# Patient Record
Sex: Female | Born: 1940 | Race: Black or African American | Hispanic: No | Smoking: Current every day smoker
Health system: Southern US, Community
[De-identification: ages and names within clinical notes are randomized; demographics above are authoritative.]

## PROBLEM LIST (undated history)

## (undated) DIAGNOSIS — Z720 Tobacco use: Secondary | ICD-10-CM

## (undated) DIAGNOSIS — R011 Cardiac murmur, unspecified: Secondary | ICD-10-CM

## (undated) DIAGNOSIS — E876 Hypokalemia: Secondary | ICD-10-CM

## (undated) DIAGNOSIS — K565 Intestinal adhesions [bands], unspecified as to partial versus complete obstruction: Secondary | ICD-10-CM

## (undated) DIAGNOSIS — H409 Unspecified glaucoma: Secondary | ICD-10-CM

## (undated) DIAGNOSIS — M810 Age-related osteoporosis without current pathological fracture: Secondary | ICD-10-CM

## (undated) DIAGNOSIS — M7551 Bursitis of right shoulder: Secondary | ICD-10-CM

## (undated) DIAGNOSIS — E559 Vitamin D deficiency, unspecified: Secondary | ICD-10-CM

## (undated) DIAGNOSIS — M75101 Unspecified rotator cuff tear or rupture of right shoulder, not specified as traumatic: Secondary | ICD-10-CM

## (undated) DIAGNOSIS — N398 Other specified disorders of urinary system: Secondary | ICD-10-CM

## (undated) DIAGNOSIS — R55 Syncope and collapse: Secondary | ICD-10-CM

## (undated) DIAGNOSIS — Z923 Personal history of irradiation: Secondary | ICD-10-CM

## (undated) DIAGNOSIS — Z5189 Encounter for other specified aftercare: Secondary | ICD-10-CM

## (undated) DIAGNOSIS — I10 Essential (primary) hypertension: Secondary | ICD-10-CM

## (undated) DIAGNOSIS — E785 Hyperlipidemia, unspecified: Secondary | ICD-10-CM

## (undated) DIAGNOSIS — H9313 Tinnitus, bilateral: Secondary | ICD-10-CM

## (undated) DIAGNOSIS — F1290 Cannabis use, unspecified, uncomplicated: Secondary | ICD-10-CM

## (undated) DIAGNOSIS — K429 Umbilical hernia without obstruction or gangrene: Secondary | ICD-10-CM

## (undated) DIAGNOSIS — Z79891 Long term (current) use of opiate analgesic: Secondary | ICD-10-CM

## (undated) DIAGNOSIS — N179 Acute kidney failure, unspecified: Secondary | ICD-10-CM

## (undated) DIAGNOSIS — M5136 Other intervertebral disc degeneration, lumbar region: Secondary | ICD-10-CM

## (undated) DIAGNOSIS — F419 Anxiety disorder, unspecified: Secondary | ICD-10-CM

## (undated) DIAGNOSIS — B20 Human immunodeficiency virus [HIV] disease: Secondary | ICD-10-CM

## (undated) DIAGNOSIS — M1990 Unspecified osteoarthritis, unspecified site: Secondary | ICD-10-CM

## (undated) DIAGNOSIS — H903 Sensorineural hearing loss, bilateral: Secondary | ICD-10-CM

## (undated) DIAGNOSIS — L219 Seborrheic dermatitis, unspecified: Secondary | ICD-10-CM

## (undated) DIAGNOSIS — M25511 Pain in right shoulder: Secondary | ICD-10-CM

## (undated) DIAGNOSIS — K5903 Drug induced constipation: Secondary | ICD-10-CM

## (undated) DIAGNOSIS — H269 Unspecified cataract: Secondary | ICD-10-CM

## (undated) DIAGNOSIS — A6000 Herpesviral infection of urogenital system, unspecified: Secondary | ICD-10-CM

## (undated) DIAGNOSIS — K5790 Diverticulosis of intestine, part unspecified, without perforation or abscess without bleeding: Secondary | ICD-10-CM

## (undated) DIAGNOSIS — F32A Depression, unspecified: Secondary | ICD-10-CM

## (undated) DIAGNOSIS — F1120 Opioid dependence, uncomplicated: Secondary | ICD-10-CM

## (undated) DIAGNOSIS — I739 Peripheral vascular disease, unspecified: Secondary | ICD-10-CM

## (undated) HISTORY — DX: Essential (primary) hypertension: I10

## (undated) HISTORY — DX: Syncope and collapse: R55

## (undated) HISTORY — DX: Herpesviral infection of urogenital system, unspecified: A60.00

## (undated) HISTORY — DX: Long term (current) use of opiate analgesic: Z79.891

## (undated) HISTORY — DX: Other intervertebral disc degeneration, lumbar region: M51.36

## (undated) HISTORY — DX: Unspecified glaucoma: H40.9

## (undated) HISTORY — DX: Tinnitus, bilateral: H93.13

## (undated) HISTORY — PX: SMALL INTESTINE SURGERY: SHX150

## (undated) HISTORY — DX: Opioid dependence, uncomplicated: F11.20

## (undated) HISTORY — DX: Human immunodeficiency virus (HIV) disease: B20

## (undated) HISTORY — PX: ABDOMINAL HYSTERECTOMY: SHX81

## (undated) HISTORY — DX: Cannabis use, unspecified, uncomplicated: F12.90

## (undated) HISTORY — DX: Hyperlipidemia, unspecified: E78.5

## (undated) HISTORY — DX: Hypokalemia: E87.6

## (undated) HISTORY — DX: Unspecified cataract: H26.9

## (undated) HISTORY — DX: Peripheral vascular disease, unspecified: I73.9

## (undated) HISTORY — DX: Unspecified osteoarthritis, unspecified site: M19.90

## (undated) HISTORY — DX: Umbilical hernia without obstruction or gangrene: K42.9

## (undated) HISTORY — PX: EYE SURGERY: SHX253

## (undated) HISTORY — DX: Intestinal adhesions (bands), unspecified as to partial versus complete obstruction: K56.50

## (undated) HISTORY — PX: CHOLECYSTECTOMY: SHX55

## (undated) HISTORY — PX: APPENDECTOMY: SHX54

## (undated) HISTORY — DX: Pain in right shoulder: M25.511

## (undated) HISTORY — DX: Vitamin D deficiency, unspecified: E55.9

## (undated) HISTORY — DX: Drug induced constipation: K59.03

## (undated) HISTORY — DX: Tobacco use: Z72.0

## (undated) HISTORY — DX: Bursitis of right shoulder: M75.51

## (undated) HISTORY — PX: BREAST SURGERY: SHX581

## (undated) HISTORY — DX: Unspecified rotator cuff tear or rupture of right shoulder, not specified as traumatic: M75.101

## (undated) HISTORY — DX: Age-related osteoporosis without current pathological fracture: M81.0

## (undated) HISTORY — DX: Diverticulosis of intestine, part unspecified, without perforation or abscess without bleeding: K57.90

## (undated) HISTORY — DX: Other specified disorders of urinary system: N39.8

## (undated) HISTORY — DX: Encounter for other specified aftercare: Z51.89

## (undated) HISTORY — DX: Seborrheic dermatitis, unspecified: L21.9

## (undated) HISTORY — DX: Anxiety disorder, unspecified: F41.9

## (undated) HISTORY — DX: Personal history of irradiation: Z92.3

---
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
## 1986-03-27 DIAGNOSIS — B20 Human immunodeficiency virus [HIV] disease: Secondary | ICD-10-CM

## 1986-03-27 HISTORY — DX: Human immunodeficiency virus (HIV) disease: B20

## 1988-01-07 ENCOUNTER — Encounter (INDEPENDENT_AMBULATORY_CARE_PROVIDER_SITE_OTHER): Payer: Self-pay | Admitting: *Deleted

## 1988-01-07 LAB — CONVERTED CEMR LAB: CD4 Count: 432 microliters

## 1997-07-20 ENCOUNTER — Encounter: Admission: RE | Admit: 1997-07-20 | Discharge: 1997-07-20 | Payer: Self-pay | Admitting: Infectious Diseases

## 1997-08-14 ENCOUNTER — Encounter: Admission: RE | Admit: 1997-08-14 | Discharge: 1997-08-14 | Payer: Self-pay | Admitting: Infectious Diseases

## 1997-09-15 ENCOUNTER — Encounter: Admission: RE | Admit: 1997-09-15 | Discharge: 1997-09-15 | Payer: Self-pay | Admitting: Obstetrics and Gynecology

## 1997-11-06 ENCOUNTER — Encounter: Admission: RE | Admit: 1997-11-06 | Discharge: 1997-11-06 | Payer: Self-pay | Admitting: *Deleted

## 1997-11-19 ENCOUNTER — Encounter: Admission: RE | Admit: 1997-11-19 | Discharge: 1997-11-19 | Payer: Self-pay | Admitting: Infectious Diseases

## 1998-01-05 ENCOUNTER — Ambulatory Visit (HOSPITAL_COMMUNITY): Admission: RE | Admit: 1998-01-05 | Discharge: 1998-01-05 | Payer: Self-pay | Admitting: Infectious Diseases

## 1998-03-02 ENCOUNTER — Encounter: Admission: RE | Admit: 1998-03-02 | Discharge: 1998-03-02 | Payer: Self-pay | Admitting: Infectious Diseases

## 1998-03-19 ENCOUNTER — Encounter: Admission: RE | Admit: 1998-03-19 | Discharge: 1998-03-19 | Payer: Self-pay | Admitting: Infectious Diseases

## 1998-04-20 ENCOUNTER — Encounter: Admission: RE | Admit: 1998-04-20 | Discharge: 1998-04-20 | Payer: Self-pay | Admitting: Infectious Diseases

## 1998-06-15 ENCOUNTER — Encounter: Admission: RE | Admit: 1998-06-15 | Discharge: 1998-06-15 | Payer: Self-pay | Admitting: Infectious Diseases

## 1998-07-15 ENCOUNTER — Encounter: Admission: RE | Admit: 1998-07-15 | Discharge: 1998-07-15 | Payer: Self-pay | Admitting: Infectious Diseases

## 1998-08-03 ENCOUNTER — Other Ambulatory Visit: Admission: RE | Admit: 1998-08-03 | Discharge: 1998-08-03 | Payer: Self-pay | Admitting: Obstetrics

## 1998-08-03 ENCOUNTER — Encounter: Admission: RE | Admit: 1998-08-03 | Discharge: 1998-08-03 | Payer: Self-pay | Admitting: Obstetrics & Gynecology

## 1998-08-04 ENCOUNTER — Encounter: Admission: RE | Admit: 1998-08-04 | Discharge: 1998-08-04 | Payer: Self-pay | Admitting: Infectious Diseases

## 1998-08-16 ENCOUNTER — Ambulatory Visit (HOSPITAL_COMMUNITY): Admission: RE | Admit: 1998-08-16 | Discharge: 1998-08-16 | Payer: Self-pay | Admitting: Obstetrics

## 1998-08-20 ENCOUNTER — Ambulatory Visit (HOSPITAL_COMMUNITY): Admission: RE | Admit: 1998-08-20 | Discharge: 1998-08-20 | Payer: Self-pay

## 1998-09-21 ENCOUNTER — Encounter: Admission: RE | Admit: 1998-09-21 | Discharge: 1998-09-21 | Payer: Self-pay | Admitting: Infectious Diseases

## 1998-09-21 ENCOUNTER — Encounter: Admission: RE | Admit: 1998-09-21 | Discharge: 1998-09-21 | Payer: Self-pay | Admitting: Obstetrics & Gynecology

## 1998-09-21 ENCOUNTER — Other Ambulatory Visit: Admission: RE | Admit: 1998-09-21 | Discharge: 1998-09-21 | Payer: Self-pay | Admitting: Obstetrics & Gynecology

## 1998-10-05 ENCOUNTER — Encounter: Admission: RE | Admit: 1998-10-05 | Discharge: 1998-10-05 | Payer: Self-pay | Admitting: Obstetrics & Gynecology

## 1998-12-23 ENCOUNTER — Encounter: Admission: RE | Admit: 1998-12-23 | Discharge: 1998-12-23 | Payer: Self-pay | Admitting: Infectious Diseases

## 1999-02-24 ENCOUNTER — Ambulatory Visit (HOSPITAL_COMMUNITY): Admission: RE | Admit: 1999-02-24 | Discharge: 1999-02-24 | Payer: Self-pay | Admitting: Obstetrics & Gynecology

## 1999-03-15 ENCOUNTER — Encounter: Admission: RE | Admit: 1999-03-15 | Discharge: 1999-03-15 | Payer: Self-pay | Admitting: Infectious Diseases

## 1999-03-22 ENCOUNTER — Encounter: Admission: RE | Admit: 1999-03-22 | Discharge: 1999-03-22 | Payer: Self-pay | Admitting: Obstetrics & Gynecology

## 1999-04-14 ENCOUNTER — Encounter: Admission: RE | Admit: 1999-04-14 | Discharge: 1999-04-14 | Payer: Self-pay | Admitting: Infectious Diseases

## 1999-05-31 ENCOUNTER — Encounter: Admission: RE | Admit: 1999-05-31 | Discharge: 1999-05-31 | Payer: Self-pay | Admitting: Internal Medicine

## 1999-05-31 ENCOUNTER — Ambulatory Visit (HOSPITAL_COMMUNITY): Admission: RE | Admit: 1999-05-31 | Discharge: 1999-05-31 | Payer: Self-pay | Admitting: Internal Medicine

## 1999-05-31 ENCOUNTER — Inpatient Hospital Stay (HOSPITAL_COMMUNITY): Admission: AD | Admit: 1999-05-31 | Discharge: 1999-06-06 | Payer: Self-pay | Admitting: Internal Medicine

## 1999-05-31 ENCOUNTER — Encounter: Payer: Self-pay | Admitting: Internal Medicine

## 1999-06-02 ENCOUNTER — Encounter: Payer: Self-pay | Admitting: Internal Medicine

## 1999-06-03 ENCOUNTER — Encounter: Payer: Self-pay | Admitting: Surgery

## 1999-06-09 ENCOUNTER — Encounter: Admission: RE | Admit: 1999-06-09 | Discharge: 1999-06-09 | Payer: Self-pay | Admitting: Infectious Diseases

## 1999-07-05 ENCOUNTER — Encounter: Admission: RE | Admit: 1999-07-05 | Discharge: 1999-07-05 | Payer: Self-pay | Admitting: Infectious Diseases

## 1999-07-07 ENCOUNTER — Encounter: Payer: Self-pay | Admitting: Surgery

## 1999-07-07 ENCOUNTER — Ambulatory Visit (HOSPITAL_COMMUNITY): Admission: RE | Admit: 1999-07-07 | Discharge: 1999-07-08 | Payer: Self-pay | Admitting: Surgery

## 1999-07-21 ENCOUNTER — Encounter: Admission: RE | Admit: 1999-07-21 | Discharge: 1999-07-21 | Payer: Self-pay | Admitting: Infectious Diseases

## 1999-10-25 ENCOUNTER — Encounter: Admission: RE | Admit: 1999-10-25 | Discharge: 1999-10-25 | Payer: Self-pay | Admitting: Infectious Diseases

## 1999-11-17 ENCOUNTER — Encounter: Admission: RE | Admit: 1999-11-17 | Discharge: 1999-11-17 | Payer: Self-pay | Admitting: Infectious Diseases

## 2000-01-29 ENCOUNTER — Encounter: Payer: Self-pay | Admitting: Neurosurgery

## 2000-01-29 ENCOUNTER — Ambulatory Visit (HOSPITAL_COMMUNITY): Admission: RE | Admit: 2000-01-29 | Discharge: 2000-01-29 | Payer: Self-pay | Admitting: Neurosurgery

## 2000-02-22 ENCOUNTER — Encounter: Admission: RE | Admit: 2000-02-22 | Discharge: 2000-02-22 | Payer: Self-pay | Admitting: Infectious Diseases

## 2000-03-22 ENCOUNTER — Encounter: Admission: RE | Admit: 2000-03-22 | Discharge: 2000-03-22 | Payer: Self-pay | Admitting: Infectious Diseases

## 2000-05-01 ENCOUNTER — Encounter: Admission: RE | Admit: 2000-05-01 | Discharge: 2000-05-01 | Payer: Self-pay | Admitting: Obstetrics & Gynecology

## 2000-05-10 ENCOUNTER — Ambulatory Visit (HOSPITAL_COMMUNITY): Admission: RE | Admit: 2000-05-10 | Discharge: 2000-05-10 | Payer: Self-pay | Admitting: Obstetrics & Gynecology

## 2000-06-06 ENCOUNTER — Encounter: Admission: RE | Admit: 2000-06-06 | Discharge: 2000-06-06 | Payer: Self-pay | Admitting: Infectious Diseases

## 2000-07-26 ENCOUNTER — Encounter: Admission: RE | Admit: 2000-07-26 | Discharge: 2000-07-26 | Payer: Self-pay | Admitting: Infectious Diseases

## 2000-07-31 ENCOUNTER — Encounter: Admission: RE | Admit: 2000-07-31 | Discharge: 2000-07-31 | Payer: Self-pay | Admitting: Obstetrics & Gynecology

## 2000-08-23 ENCOUNTER — Encounter: Admission: RE | Admit: 2000-08-23 | Discharge: 2000-08-23 | Payer: Self-pay | Admitting: Infectious Diseases

## 2000-10-03 ENCOUNTER — Encounter: Admission: RE | Admit: 2000-10-03 | Discharge: 2000-10-03 | Payer: Self-pay | Admitting: Infectious Diseases

## 2000-12-13 ENCOUNTER — Encounter: Admission: RE | Admit: 2000-12-13 | Discharge: 2000-12-13 | Payer: Self-pay | Admitting: Infectious Diseases

## 2001-01-15 ENCOUNTER — Encounter: Admission: RE | Admit: 2001-01-15 | Discharge: 2001-01-15 | Payer: Self-pay | Admitting: Infectious Diseases

## 2001-01-31 ENCOUNTER — Encounter: Admission: RE | Admit: 2001-01-31 | Discharge: 2001-01-31 | Payer: Self-pay | Admitting: Obstetrics

## 2001-05-07 ENCOUNTER — Encounter: Admission: RE | Admit: 2001-05-07 | Discharge: 2001-05-07 | Payer: Self-pay | Admitting: Infectious Diseases

## 2001-06-25 ENCOUNTER — Encounter: Admission: RE | Admit: 2001-06-25 | Discharge: 2001-06-25 | Payer: Self-pay | Admitting: Infectious Diseases

## 2001-07-25 ENCOUNTER — Encounter (INDEPENDENT_AMBULATORY_CARE_PROVIDER_SITE_OTHER): Payer: Self-pay | Admitting: *Deleted

## 2001-07-25 ENCOUNTER — Encounter: Admission: RE | Admit: 2001-07-25 | Discharge: 2001-07-25 | Payer: Self-pay | Admitting: Obstetrics and Gynecology

## 2001-08-06 ENCOUNTER — Ambulatory Visit (HOSPITAL_COMMUNITY): Admission: RE | Admit: 2001-08-06 | Discharge: 2001-08-06 | Payer: Self-pay | Admitting: Obstetrics and Gynecology

## 2001-08-27 ENCOUNTER — Encounter: Admission: RE | Admit: 2001-08-27 | Discharge: 2001-08-27 | Payer: Self-pay | Admitting: Infectious Diseases

## 2001-10-31 ENCOUNTER — Encounter: Admission: RE | Admit: 2001-10-31 | Discharge: 2001-10-31 | Payer: Self-pay | Admitting: Infectious Diseases

## 2001-11-15 ENCOUNTER — Encounter: Admission: RE | Admit: 2001-11-15 | Discharge: 2001-11-15 | Payer: Self-pay | Admitting: Infectious Diseases

## 2001-12-17 ENCOUNTER — Encounter: Admission: RE | Admit: 2001-12-17 | Discharge: 2001-12-17 | Payer: Self-pay | Admitting: Infectious Diseases

## 2002-01-02 ENCOUNTER — Ambulatory Visit (HOSPITAL_COMMUNITY): Admission: RE | Admit: 2002-01-02 | Discharge: 2002-01-02 | Payer: Self-pay | Admitting: Gastroenterology

## 2002-04-17 ENCOUNTER — Ambulatory Visit (HOSPITAL_COMMUNITY): Admission: RE | Admit: 2002-04-17 | Discharge: 2002-04-17 | Payer: Self-pay | Admitting: Infectious Diseases

## 2002-04-17 ENCOUNTER — Encounter: Admission: RE | Admit: 2002-04-17 | Discharge: 2002-04-17 | Payer: Self-pay | Admitting: Infectious Diseases

## 2002-04-17 ENCOUNTER — Encounter: Payer: Self-pay | Admitting: Infectious Diseases

## 2002-07-31 ENCOUNTER — Encounter (INDEPENDENT_AMBULATORY_CARE_PROVIDER_SITE_OTHER): Payer: Self-pay | Admitting: *Deleted

## 2002-07-31 ENCOUNTER — Encounter: Admission: RE | Admit: 2002-07-31 | Discharge: 2002-07-31 | Payer: Self-pay | Admitting: Infectious Diseases

## 2002-07-31 LAB — CONVERTED CEMR LAB
CD4 Count: 1240 microliters
HIV 1 RNA Quant: 28 copies/mL

## 2002-08-01 ENCOUNTER — Encounter: Admission: RE | Admit: 2002-08-01 | Discharge: 2002-08-01 | Payer: Self-pay | Admitting: Family Medicine

## 2002-08-01 ENCOUNTER — Other Ambulatory Visit: Admission: RE | Admit: 2002-08-01 | Discharge: 2002-08-01 | Payer: Self-pay | Admitting: Family Medicine

## 2002-08-12 ENCOUNTER — Ambulatory Visit (HOSPITAL_COMMUNITY): Admission: RE | Admit: 2002-08-12 | Discharge: 2002-08-12 | Payer: Self-pay | Admitting: Obstetrics and Gynecology

## 2002-08-21 ENCOUNTER — Encounter: Admission: RE | Admit: 2002-08-21 | Discharge: 2002-08-21 | Payer: Self-pay | Admitting: Infectious Diseases

## 2002-10-03 ENCOUNTER — Encounter (INDEPENDENT_AMBULATORY_CARE_PROVIDER_SITE_OTHER): Payer: Self-pay | Admitting: *Deleted

## 2002-10-03 ENCOUNTER — Encounter: Admission: RE | Admit: 2002-10-03 | Discharge: 2002-10-03 | Payer: Self-pay | Admitting: Family Medicine

## 2002-10-03 ENCOUNTER — Other Ambulatory Visit: Admission: RE | Admit: 2002-10-03 | Discharge: 2002-10-03 | Payer: Self-pay | Admitting: Obstetrics & Gynecology

## 2002-11-05 ENCOUNTER — Ambulatory Visit: Admission: RE | Admit: 2002-11-05 | Discharge: 2002-11-05 | Payer: Self-pay | Admitting: Gynecologic Oncology

## 2002-12-09 ENCOUNTER — Encounter: Admission: RE | Admit: 2002-12-09 | Discharge: 2002-12-09 | Payer: Self-pay | Admitting: Infectious Diseases

## 2002-12-25 ENCOUNTER — Encounter: Admission: RE | Admit: 2002-12-25 | Discharge: 2002-12-25 | Payer: Self-pay | Admitting: Infectious Diseases

## 2003-03-17 ENCOUNTER — Encounter: Admission: RE | Admit: 2003-03-17 | Discharge: 2003-03-17 | Payer: Self-pay | Admitting: Infectious Diseases

## 2003-03-27 ENCOUNTER — Encounter: Admission: RE | Admit: 2003-03-27 | Discharge: 2003-03-27 | Payer: Self-pay | Admitting: Infectious Diseases

## 2003-06-11 ENCOUNTER — Encounter: Admission: RE | Admit: 2003-06-11 | Discharge: 2003-06-11 | Payer: Self-pay | Admitting: Infectious Diseases

## 2003-07-08 ENCOUNTER — Encounter: Admission: RE | Admit: 2003-07-08 | Discharge: 2003-07-08 | Payer: Self-pay | Admitting: Infectious Diseases

## 2003-08-18 ENCOUNTER — Ambulatory Visit (HOSPITAL_COMMUNITY): Admission: RE | Admit: 2003-08-18 | Discharge: 2003-08-18 | Payer: Self-pay | Admitting: Infectious Diseases

## 2003-09-10 ENCOUNTER — Encounter: Admission: RE | Admit: 2003-09-10 | Discharge: 2003-09-10 | Payer: Self-pay | Admitting: Infectious Diseases

## 2003-10-20 ENCOUNTER — Encounter: Admission: RE | Admit: 2003-10-20 | Discharge: 2003-10-20 | Payer: Self-pay | Admitting: Infectious Diseases

## 2003-10-29 ENCOUNTER — Encounter: Admission: RE | Admit: 2003-10-29 | Discharge: 2003-10-29 | Payer: Self-pay | Admitting: Infectious Diseases

## 2003-12-23 ENCOUNTER — Ambulatory Visit: Payer: Self-pay | Admitting: Infectious Diseases

## 2004-01-29 ENCOUNTER — Ambulatory Visit: Payer: Self-pay | Admitting: Infectious Diseases

## 2004-02-18 ENCOUNTER — Ambulatory Visit: Payer: Self-pay | Admitting: Infectious Diseases

## 2004-02-25 ENCOUNTER — Ambulatory Visit: Payer: Self-pay | Admitting: *Deleted

## 2004-03-04 ENCOUNTER — Encounter: Admission: RE | Admit: 2004-03-04 | Discharge: 2004-03-04 | Payer: Self-pay | Admitting: Urology

## 2004-03-11 ENCOUNTER — Ambulatory Visit (HOSPITAL_BASED_OUTPATIENT_CLINIC_OR_DEPARTMENT_OTHER): Admission: RE | Admit: 2004-03-11 | Discharge: 2004-03-11 | Payer: Self-pay | Admitting: Urology

## 2004-03-11 ENCOUNTER — Ambulatory Visit (HOSPITAL_COMMUNITY): Admission: RE | Admit: 2004-03-11 | Discharge: 2004-03-11 | Payer: Self-pay | Admitting: Urology

## 2004-03-11 ENCOUNTER — Encounter (INDEPENDENT_AMBULATORY_CARE_PROVIDER_SITE_OTHER): Payer: Self-pay | Admitting: *Deleted

## 2004-04-19 ENCOUNTER — Ambulatory Visit: Payer: Self-pay | Admitting: Infectious Diseases

## 2004-05-19 ENCOUNTER — Ambulatory Visit: Payer: Self-pay | Admitting: Infectious Diseases

## 2004-06-28 ENCOUNTER — Emergency Department (HOSPITAL_COMMUNITY): Admission: EM | Admit: 2004-06-28 | Discharge: 2004-06-28 | Payer: Self-pay | Admitting: Emergency Medicine

## 2004-06-28 ENCOUNTER — Ambulatory Visit: Payer: Self-pay | Admitting: Obstetrics and Gynecology

## 2004-07-08 ENCOUNTER — Encounter: Admission: RE | Admit: 2004-07-08 | Discharge: 2004-08-09 | Payer: Self-pay | Admitting: Orthopedic Surgery

## 2004-07-19 ENCOUNTER — Ambulatory Visit: Payer: Self-pay | Admitting: Infectious Diseases

## 2004-08-31 ENCOUNTER — Ambulatory Visit (HOSPITAL_COMMUNITY): Admission: RE | Admit: 2004-08-31 | Discharge: 2004-08-31 | Payer: Self-pay | Admitting: Family Medicine

## 2004-09-01 ENCOUNTER — Ambulatory Visit: Payer: Self-pay | Admitting: Infectious Diseases

## 2004-09-08 ENCOUNTER — Ambulatory Visit (HOSPITAL_COMMUNITY): Admission: RE | Admit: 2004-09-08 | Discharge: 2004-09-08 | Payer: Self-pay | Admitting: Infectious Diseases

## 2004-12-06 ENCOUNTER — Ambulatory Visit: Payer: Self-pay | Admitting: Infectious Diseases

## 2005-01-05 ENCOUNTER — Ambulatory Visit: Payer: Self-pay | Admitting: Infectious Diseases

## 2005-03-28 ENCOUNTER — Ambulatory Visit: Payer: Self-pay | Admitting: Infectious Diseases

## 2005-05-19 ENCOUNTER — Ambulatory Visit: Payer: Self-pay | Admitting: Infectious Diseases

## 2005-07-18 ENCOUNTER — Ambulatory Visit: Payer: Self-pay | Admitting: Infectious Diseases

## 2005-08-04 ENCOUNTER — Ambulatory Visit: Payer: Self-pay | Admitting: Infectious Diseases

## 2005-11-28 ENCOUNTER — Ambulatory Visit: Payer: Self-pay | Admitting: Infectious Diseases

## 2005-11-28 ENCOUNTER — Encounter (INDEPENDENT_AMBULATORY_CARE_PROVIDER_SITE_OTHER): Payer: Self-pay | Admitting: *Deleted

## 2005-11-30 ENCOUNTER — Ambulatory Visit (HOSPITAL_COMMUNITY): Admission: RE | Admit: 2005-11-30 | Discharge: 2005-11-30 | Payer: Self-pay | Admitting: Infectious Diseases

## 2005-12-21 ENCOUNTER — Ambulatory Visit: Payer: Self-pay | Admitting: Obstetrics and Gynecology

## 2006-01-05 ENCOUNTER — Ambulatory Visit: Payer: Self-pay | Admitting: Infectious Diseases

## 2006-03-20 ENCOUNTER — Encounter (INDEPENDENT_AMBULATORY_CARE_PROVIDER_SITE_OTHER): Payer: Self-pay | Admitting: *Deleted

## 2006-03-20 ENCOUNTER — Ambulatory Visit: Payer: Self-pay | Admitting: Infectious Diseases

## 2006-03-20 LAB — CONVERTED CEMR LAB
AST: 18 units/L (ref 0–37)
Alkaline Phosphatase: 87 units/L (ref 39–117)
CO2: 24 meq/L (ref 19–32)
Calcium: 10 mg/dL (ref 8.4–10.5)
Chloride: 106 meq/L (ref 96–112)
Glucose, Bld: 94 mg/dL (ref 70–99)
HDL: 65 mg/dL (ref >39)
LDL Cholesterol: 64 mg/dL (ref 0–99)
Potassium: 4.1 meq/L (ref 3.5–5.3)
Sodium: 138 meq/L (ref 135–145)
Total Bilirubin: 0.4 mg/dL (ref 0.3–1.2)
Total CHOL/HDL Ratio: 2.2

## 2006-04-19 ENCOUNTER — Ambulatory Visit: Payer: Self-pay | Admitting: Infectious Diseases

## 2006-05-07 ENCOUNTER — Encounter (INDEPENDENT_AMBULATORY_CARE_PROVIDER_SITE_OTHER): Payer: Self-pay | Admitting: *Deleted

## 2006-05-07 LAB — CONVERTED CEMR LAB

## 2006-05-20 ENCOUNTER — Encounter (INDEPENDENT_AMBULATORY_CARE_PROVIDER_SITE_OTHER): Payer: Self-pay | Admitting: *Deleted

## 2006-06-23 ENCOUNTER — Encounter: Payer: Self-pay | Admitting: Infectious Diseases

## 2006-06-25 ENCOUNTER — Telehealth: Payer: Self-pay | Admitting: Infectious Diseases

## 2006-06-26 ENCOUNTER — Ambulatory Visit: Payer: Self-pay | Admitting: Infectious Diseases

## 2006-06-26 LAB — CONVERTED CEMR LAB
ALT: 16 units/L (ref 0–35)
AST: 11 units/L (ref 0–37)
Alkaline Phosphatase: 58 units/L (ref 39–117)
Basophils Absolute: 0 10*3/uL (ref 0.0–0.1)
Basophils Relative: 0 % (ref 0–1)
Creatinine, Ser: 0.77 mg/dL (ref 0.40–1.20)
Eosinophils Absolute: 0.1 10*3/uL (ref 0.0–0.7)
Eosinophils Relative: 1 % (ref 0–5)
HCT: 40.7 % (ref 36.0–46.0)
HDL: 75 mg/dL (ref >39)
Hemoglobin: 13.5 g/dL (ref 12.0–15.0)
MCHC: 33.2 g/dL (ref 30.0–36.0)
MCV: 101.5 fL — ABNORMAL HIGH (ref 78.0–100.0)
Monocytes Absolute: 0.5 10*3/uL (ref 0.2–0.7)
Neutro Abs: 4.2 10*3/uL (ref 1.7–7.7)
RDW: 15.9 % — ABNORMAL HIGH (ref 11.5–14.0)
Total Bilirubin: 0.4 mg/dL (ref 0.3–1.2)
Total CHOL/HDL Ratio: 1.8
VLDL: 10 mg/dL (ref 0–40)

## 2006-07-17 ENCOUNTER — Telehealth: Payer: Self-pay | Admitting: Infectious Diseases

## 2006-07-19 ENCOUNTER — Encounter: Payer: Self-pay | Admitting: Infectious Diseases

## 2006-07-20 ENCOUNTER — Telehealth (INDEPENDENT_AMBULATORY_CARE_PROVIDER_SITE_OTHER): Payer: Self-pay | Admitting: *Deleted

## 2006-07-20 DIAGNOSIS — A6000 Herpesviral infection of urogenital system, unspecified: Secondary | ICD-10-CM | POA: Insufficient documentation

## 2006-07-20 DIAGNOSIS — H409 Unspecified glaucoma: Secondary | ICD-10-CM | POA: Insufficient documentation

## 2006-07-20 DIAGNOSIS — B49 Unspecified mycosis: Secondary | ICD-10-CM | POA: Insufficient documentation

## 2006-07-20 DIAGNOSIS — M51369 Other intervertebral disc degeneration, lumbar region without mention of lumbar back pain or lower extremity pain: Secondary | ICD-10-CM

## 2006-07-20 DIAGNOSIS — I10 Essential (primary) hypertension: Secondary | ICD-10-CM | POA: Insufficient documentation

## 2006-07-20 DIAGNOSIS — R8789 Other abnormal findings in specimens from female genital organs: Secondary | ICD-10-CM

## 2006-07-20 DIAGNOSIS — M5136 Other intervertebral disc degeneration, lumbar region: Secondary | ICD-10-CM | POA: Insufficient documentation

## 2006-07-20 DIAGNOSIS — Z8742 Personal history of other diseases of the female genital tract: Secondary | ICD-10-CM | POA: Insufficient documentation

## 2006-07-20 HISTORY — DX: Herpesviral infection of urogenital system, unspecified: A60.00

## 2006-07-20 HISTORY — DX: Essential (primary) hypertension: I10

## 2006-07-20 HISTORY — DX: Unspecified glaucoma: H40.9

## 2006-07-20 HISTORY — DX: Other intervertebral disc degeneration, lumbar region: M51.36

## 2006-07-20 HISTORY — DX: Other intervertebral disc degeneration, lumbar region without mention of lumbar back pain or lower extremity pain: M51.369

## 2006-08-10 ENCOUNTER — Encounter (INDEPENDENT_AMBULATORY_CARE_PROVIDER_SITE_OTHER): Payer: Self-pay | Admitting: *Deleted

## 2006-08-31 ENCOUNTER — Encounter: Payer: Self-pay | Admitting: Infectious Diseases

## 2006-09-07 ENCOUNTER — Telehealth: Payer: Self-pay | Admitting: Infectious Diseases

## 2006-09-07 ENCOUNTER — Ambulatory Visit: Payer: Self-pay | Admitting: Infectious Diseases

## 2006-09-07 ENCOUNTER — Encounter: Payer: Self-pay | Admitting: Infectious Diseases

## 2006-09-09 ENCOUNTER — Encounter: Admission: RE | Admit: 2006-09-09 | Discharge: 2006-09-09 | Payer: Self-pay | Admitting: Neurosurgery

## 2006-09-10 ENCOUNTER — Encounter: Payer: Self-pay | Admitting: Infectious Diseases

## 2006-09-10 LAB — CONVERTED CEMR LAB
Pap Smear: NORMAL
Pap Smear: NORMAL

## 2006-09-12 ENCOUNTER — Encounter: Payer: Self-pay | Admitting: Infectious Diseases

## 2006-09-12 ENCOUNTER — Encounter (INDEPENDENT_AMBULATORY_CARE_PROVIDER_SITE_OTHER): Payer: Self-pay | Admitting: *Deleted

## 2006-09-21 ENCOUNTER — Telehealth: Payer: Self-pay | Admitting: Infectious Diseases

## 2006-10-23 ENCOUNTER — Encounter: Payer: Self-pay | Admitting: Infectious Diseases

## 2006-10-30 ENCOUNTER — Ambulatory Visit: Payer: Self-pay | Admitting: Infectious Diseases

## 2006-10-30 DIAGNOSIS — N3000 Acute cystitis without hematuria: Secondary | ICD-10-CM | POA: Insufficient documentation

## 2006-11-06 ENCOUNTER — Ambulatory Visit: Payer: Self-pay | Admitting: Infectious Diseases

## 2006-11-06 LAB — CONVERTED CEMR LAB
Bilirubin Urine: NEGATIVE
Leukocytes, UA: NEGATIVE
Specific Gravity, Urine: 1.014 (ref 1.005–1.03)
Urine Glucose: NEGATIVE mg/dL
pH: 7 (ref 5.0–8.0)

## 2006-11-07 ENCOUNTER — Encounter (INDEPENDENT_AMBULATORY_CARE_PROVIDER_SITE_OTHER): Payer: Self-pay | Admitting: *Deleted

## 2006-11-09 ENCOUNTER — Ambulatory Visit: Payer: Self-pay | Admitting: Infectious Diseases

## 2006-12-20 ENCOUNTER — Ambulatory Visit: Payer: Self-pay | Admitting: Infectious Diseases

## 2006-12-22 ENCOUNTER — Encounter: Payer: Self-pay | Admitting: Infectious Diseases

## 2007-01-16 ENCOUNTER — Telehealth: Payer: Self-pay | Admitting: Infectious Diseases

## 2007-01-17 ENCOUNTER — Encounter: Payer: Self-pay | Admitting: Infectious Diseases

## 2007-01-17 ENCOUNTER — Ambulatory Visit (HOSPITAL_COMMUNITY): Admission: RE | Admit: 2007-01-17 | Discharge: 2007-01-17 | Payer: Self-pay | Admitting: Infectious Diseases

## 2007-01-29 ENCOUNTER — Ambulatory Visit: Payer: Self-pay | Admitting: Infectious Diseases

## 2007-02-20 ENCOUNTER — Telehealth: Payer: Self-pay | Admitting: Infectious Diseases

## 2007-02-21 ENCOUNTER — Telehealth (INDEPENDENT_AMBULATORY_CARE_PROVIDER_SITE_OTHER): Payer: Self-pay | Admitting: *Deleted

## 2007-02-22 ENCOUNTER — Encounter: Payer: Self-pay | Admitting: Infectious Diseases

## 2007-03-12 ENCOUNTER — Encounter: Payer: Self-pay | Admitting: Infectious Diseases

## 2007-03-13 ENCOUNTER — Encounter (INDEPENDENT_AMBULATORY_CARE_PROVIDER_SITE_OTHER): Payer: Self-pay | Admitting: *Deleted

## 2007-03-28 ENCOUNTER — Telehealth: Payer: Self-pay | Admitting: Infectious Diseases

## 2007-04-02 ENCOUNTER — Emergency Department (HOSPITAL_COMMUNITY): Admission: EM | Admit: 2007-04-02 | Discharge: 2007-04-02 | Payer: Self-pay | Admitting: Emergency Medicine

## 2007-04-14 HISTORY — PX: AORTO-FEMORAL BYPASS GRAFT: SHX885

## 2007-04-22 ENCOUNTER — Telehealth: Payer: Self-pay | Admitting: Infectious Diseases

## 2007-04-24 ENCOUNTER — Encounter: Payer: Self-pay | Admitting: Infectious Diseases

## 2007-04-24 ENCOUNTER — Ambulatory Visit: Payer: Self-pay | Admitting: Infectious Diseases

## 2007-04-25 ENCOUNTER — Ambulatory Visit (HOSPITAL_COMMUNITY): Admission: RE | Admit: 2007-04-25 | Discharge: 2007-04-25 | Payer: Self-pay | Admitting: Infectious Diseases

## 2007-04-25 ENCOUNTER — Encounter: Payer: Self-pay | Admitting: Infectious Diseases

## 2007-04-25 ENCOUNTER — Ambulatory Visit: Payer: Self-pay | Admitting: Vascular Surgery

## 2007-04-30 ENCOUNTER — Encounter: Payer: Self-pay | Admitting: Infectious Diseases

## 2007-04-30 ENCOUNTER — Ambulatory Visit: Payer: Self-pay | Admitting: Vascular Surgery

## 2007-05-03 ENCOUNTER — Encounter: Admission: RE | Admit: 2007-05-03 | Discharge: 2007-05-03 | Payer: Self-pay | Admitting: Vascular Surgery

## 2007-05-07 ENCOUNTER — Ambulatory Visit: Payer: Self-pay | Admitting: Vascular Surgery

## 2007-05-07 ENCOUNTER — Encounter: Payer: Self-pay | Admitting: Infectious Diseases

## 2007-05-08 ENCOUNTER — Ambulatory Visit: Payer: Self-pay | Admitting: Cardiology

## 2007-05-08 ENCOUNTER — Inpatient Hospital Stay (HOSPITAL_COMMUNITY): Admission: RE | Admit: 2007-05-08 | Discharge: 2007-05-17 | Payer: Self-pay | Admitting: Vascular Surgery

## 2007-05-08 ENCOUNTER — Encounter: Payer: Self-pay | Admitting: Vascular Surgery

## 2007-05-15 ENCOUNTER — Encounter: Payer: Self-pay | Admitting: Vascular Surgery

## 2007-05-16 ENCOUNTER — Ambulatory Visit: Payer: Self-pay | Admitting: Vascular Surgery

## 2007-05-16 ENCOUNTER — Encounter: Payer: Self-pay | Admitting: Vascular Surgery

## 2007-05-21 ENCOUNTER — Ambulatory Visit: Payer: Self-pay | Admitting: Vascular Surgery

## 2007-06-04 ENCOUNTER — Ambulatory Visit: Payer: Self-pay | Admitting: Vascular Surgery

## 2007-06-04 ENCOUNTER — Ambulatory Visit: Payer: Self-pay | Admitting: Infectious Diseases

## 2007-06-04 LAB — CONVERTED CEMR LAB
ALT: 9 units/L (ref 0–35)
AST: 17 units/L (ref 0–37)
Albumin: 4.7 g/dL (ref 3.5–5.2)
Alkaline Phosphatase: 70 units/L (ref 39–117)
Basophils Absolute: 0.1 10*3/uL (ref 0.0–0.1)
Basophils Relative: 1 % (ref 0–1)
Calcium: 9.9 mg/dL (ref 8.4–10.5)
Chloride: 106 meq/L (ref 96–112)
Eosinophils Absolute: 0.3 10*3/uL (ref 0.0–0.7)
HDL: 71 mg/dL (ref >39)
LDL Cholesterol: 44 mg/dL (ref 0–99)
MCHC: 34.1 g/dL (ref 30.0–36.0)
MCV: 95.1 fL (ref 78.0–100.0)
Neutro Abs: 2.8 10*3/uL (ref 1.7–7.7)
Neutrophils Relative %: 40 % — ABNORMAL LOW (ref 43–77)
Platelets: 362 10*3/uL (ref 150–400)
Potassium: 3.5 meq/L (ref 3.5–5.3)
RBC: 3.67 M/uL — ABNORMAL LOW (ref 3.87–5.11)
Sodium: 141 meq/L (ref 135–145)
Total Protein: 8.2 g/dL (ref 6.0–8.3)

## 2007-07-09 ENCOUNTER — Telehealth: Payer: Self-pay | Admitting: Infectious Diseases

## 2007-08-08 ENCOUNTER — Encounter: Payer: Self-pay | Admitting: Infectious Diseases

## 2007-08-08 ENCOUNTER — Telehealth: Payer: Self-pay | Admitting: Internal Medicine

## 2007-08-12 ENCOUNTER — Encounter: Payer: Self-pay | Admitting: Infectious Diseases

## 2007-08-12 ENCOUNTER — Ambulatory Visit: Payer: Self-pay | Admitting: Infectious Diseases

## 2007-08-12 LAB — CONVERTED CEMR LAB: Pap Smear: NORMAL

## 2007-08-13 ENCOUNTER — Encounter: Payer: Self-pay | Admitting: Infectious Diseases

## 2007-08-13 ENCOUNTER — Ambulatory Visit: Payer: Self-pay | Admitting: Vascular Surgery

## 2007-08-15 ENCOUNTER — Encounter (INDEPENDENT_AMBULATORY_CARE_PROVIDER_SITE_OTHER): Payer: Self-pay | Admitting: *Deleted

## 2007-08-15 ENCOUNTER — Encounter: Payer: Self-pay | Admitting: Infectious Diseases

## 2007-08-27 ENCOUNTER — Encounter: Payer: Self-pay | Admitting: Infectious Diseases

## 2007-09-05 ENCOUNTER — Encounter: Payer: Self-pay | Admitting: Infectious Diseases

## 2007-09-12 ENCOUNTER — Encounter: Payer: Self-pay | Admitting: Infectious Diseases

## 2007-09-12 ENCOUNTER — Ambulatory Visit: Payer: Self-pay | Admitting: Infectious Diseases

## 2007-09-12 LAB — CONVERTED CEMR LAB
ALT: 15 units/L (ref 0–35)
AST: 21 units/L (ref 0–37)
Calcium: 9.7 mg/dL (ref 8.4–10.5)
Chloride: 105 meq/L (ref 96–112)
Creatinine, Ser: 0.64 mg/dL (ref 0.40–1.20)
Creatinine, Urine: 65.8 mg/dL
HIV 1 RNA Quant: 49 copies/mL
Potassium: 3.8 meq/L (ref 3.5–5.3)
Sodium: 139 meq/L (ref 135–145)
Total CHOL/HDL Ratio: 2.1
Total Protein, Urine: 4
Total Protein: 8.5 g/dL — ABNORMAL HIGH (ref 6.0–8.3)

## 2007-10-23 ENCOUNTER — Ambulatory Visit: Payer: Self-pay | Admitting: Vascular Surgery

## 2008-01-06 ENCOUNTER — Telehealth: Payer: Self-pay | Admitting: Infectious Diseases

## 2008-01-09 ENCOUNTER — Ambulatory Visit (HOSPITAL_COMMUNITY): Admission: RE | Admit: 2008-01-09 | Discharge: 2008-01-09 | Payer: Self-pay | Admitting: Infectious Diseases

## 2008-01-15 ENCOUNTER — Ambulatory Visit: Payer: Self-pay | Admitting: Infectious Diseases

## 2008-01-15 ENCOUNTER — Encounter: Payer: Self-pay | Admitting: Infectious Diseases

## 2008-01-15 LAB — CONVERTED CEMR LAB: Protein, U semiquant: >=300

## 2008-01-17 ENCOUNTER — Encounter: Payer: Self-pay | Admitting: Infectious Diseases

## 2008-02-13 ENCOUNTER — Ambulatory Visit: Payer: Self-pay | Admitting: Infectious Diseases

## 2008-04-21 ENCOUNTER — Encounter: Payer: Self-pay | Admitting: Infectious Diseases

## 2008-04-30 ENCOUNTER — Ambulatory Visit: Payer: Self-pay | Admitting: Infectious Diseases

## 2008-04-30 LAB — CONVERTED CEMR LAB
ALT: 16 units/L (ref 0–35)
AST: 21 units/L (ref 0–37)
Albumin: 4.8 g/dL (ref 3.5–5.2)
Alkaline Phosphatase: 75 units/L (ref 39–117)
BUN: 19 mg/dL (ref 6–23)
Basophils Absolute: 0 10*3/uL (ref 0.0–0.1)
Basophils Relative: 1 % (ref 0–1)
Calcium: 9.8 mg/dL (ref 8.4–10.5)
Chloride: 104 meq/L (ref 96–112)
Creatinine, Ser: 0.66 mg/dL (ref 0.40–1.20)
HDL: 75 mg/dL (ref >39)
Hemoglobin: 13.3 g/dL (ref 12.0–15.0)
LDL Cholesterol: 56 mg/dL (ref 0–99)
Lymphocytes Relative: 71 % — ABNORMAL HIGH (ref 12–46)
MCHC: 34.1 g/dL (ref 30.0–36.0)
Monocytes Absolute: 0.2 10*3/uL (ref 0.1–1.0)
Neutro Abs: 1.7 10*3/uL (ref 1.7–7.7)
Platelets: 300 10*3/uL (ref 150–400)
Potassium: 4 meq/L (ref 3.5–5.3)
RDW: 13.2 % (ref 11.5–15.5)
Total CHOL/HDL Ratio: 1.9

## 2008-05-06 ENCOUNTER — Ambulatory Visit: Payer: Self-pay | Admitting: Vascular Surgery

## 2008-06-01 ENCOUNTER — Telehealth: Payer: Self-pay | Admitting: Infectious Diseases

## 2008-06-11 ENCOUNTER — Encounter: Payer: Self-pay | Admitting: Infectious Diseases

## 2008-07-09 ENCOUNTER — Ambulatory Visit: Payer: Self-pay | Admitting: Infectious Diseases

## 2008-08-11 ENCOUNTER — Ambulatory Visit: Payer: Self-pay | Admitting: Infectious Diseases

## 2008-08-11 ENCOUNTER — Encounter (INDEPENDENT_AMBULATORY_CARE_PROVIDER_SITE_OTHER): Payer: Self-pay | Admitting: *Deleted

## 2008-09-07 ENCOUNTER — Ambulatory Visit: Payer: Self-pay | Admitting: Infectious Diseases

## 2008-09-07 ENCOUNTER — Encounter: Payer: Self-pay | Admitting: Infectious Diseases

## 2008-09-25 ENCOUNTER — Encounter: Payer: Self-pay | Admitting: *Deleted

## 2008-11-03 ENCOUNTER — Telehealth: Payer: Self-pay | Admitting: Infectious Diseases

## 2008-11-19 ENCOUNTER — Ambulatory Visit: Payer: Self-pay | Admitting: Infectious Diseases

## 2008-11-20 ENCOUNTER — Encounter: Payer: Self-pay | Admitting: Infectious Diseases

## 2008-12-10 ENCOUNTER — Encounter: Admission: RE | Admit: 2008-12-10 | Discharge: 2009-03-10 | Payer: Self-pay | Admitting: Infectious Diseases

## 2008-12-23 ENCOUNTER — Ambulatory Visit: Payer: Self-pay | Admitting: Infectious Diseases

## 2008-12-23 ENCOUNTER — Encounter: Payer: Self-pay | Admitting: Infectious Diseases

## 2008-12-23 LAB — CONVERTED CEMR LAB
ALT: 14 units/L (ref 0–35)
AST: 19 units/L (ref 0–37)
CD4 Count: 1468 microliters
Calcium: 9.8 mg/dL (ref 8.4–10.5)
Chloride: 105 meq/L (ref 96–112)
Creatinine, Ser: 0.66 mg/dL (ref 0.40–1.20)
HIV 1 RNA Quant: 39 copies/mL
Potassium: 3.8 meq/L (ref 3.5–5.3)
Sodium: 139 meq/L (ref 135–145)
Total CHOL/HDL Ratio: 2.1
Total Protein: 8 g/dL (ref 6.0–8.3)

## 2008-12-28 ENCOUNTER — Encounter: Payer: Self-pay | Admitting: Infectious Diseases

## 2009-01-01 ENCOUNTER — Encounter: Payer: Self-pay | Admitting: Infectious Diseases

## 2009-02-12 ENCOUNTER — Ambulatory Visit (HOSPITAL_COMMUNITY): Admission: RE | Admit: 2009-02-12 | Discharge: 2009-02-12 | Payer: Self-pay | Admitting: Infectious Diseases

## 2009-03-25 ENCOUNTER — Ambulatory Visit: Payer: Self-pay | Admitting: Infectious Diseases

## 2009-04-08 ENCOUNTER — Encounter: Payer: Self-pay | Admitting: Infectious Diseases

## 2009-04-08 ENCOUNTER — Emergency Department (HOSPITAL_COMMUNITY): Admission: EM | Admit: 2009-04-08 | Discharge: 2009-04-08 | Payer: Self-pay | Admitting: Emergency Medicine

## 2009-04-09 ENCOUNTER — Telehealth: Payer: Self-pay | Admitting: Infectious Diseases

## 2009-04-14 ENCOUNTER — Encounter: Payer: Self-pay | Admitting: Infectious Diseases

## 2009-04-20 ENCOUNTER — Ambulatory Visit: Payer: Self-pay | Admitting: Vascular Surgery

## 2009-04-22 ENCOUNTER — Ambulatory Visit: Payer: Self-pay | Admitting: Infectious Diseases

## 2009-04-23 ENCOUNTER — Encounter: Payer: Self-pay | Admitting: Infectious Diseases

## 2009-04-23 LAB — CONVERTED CEMR LAB
AST: 17 units/L (ref 0–37)
Albumin: 4.5 g/dL (ref 3.5–5.2)
Alkaline Phosphatase: 78 units/L (ref 39–117)
Basophils Absolute: 0 10*3/uL (ref 0.0–0.1)
Hemoglobin: 13.1 g/dL (ref 12.0–15.0)
LDL Cholesterol: 45 mg/dL (ref 0–99)
Lymphocytes Relative: 54 % — ABNORMAL HIGH (ref 12–46)
Monocytes Absolute: 0.3 10*3/uL (ref 0.1–1.0)
Neutro Abs: 3.5 10*3/uL (ref 1.7–7.7)
Platelets: 358 10*3/uL (ref 150–400)
Potassium: 4.4 meq/L (ref 3.5–5.3)
RDW: 13 % (ref 11.5–15.5)
Sodium: 138 meq/L (ref 135–145)
Total Protein: 8 g/dL (ref 6.0–8.3)
WBC: 8.5 10*3/uL (ref 4.0–10.5)

## 2009-05-05 ENCOUNTER — Encounter: Payer: Self-pay | Admitting: Infectious Diseases

## 2009-06-14 ENCOUNTER — Telehealth: Payer: Self-pay | Admitting: Internal Medicine

## 2009-07-13 ENCOUNTER — Telehealth (INDEPENDENT_AMBULATORY_CARE_PROVIDER_SITE_OTHER): Payer: Self-pay | Admitting: *Deleted

## 2009-07-15 ENCOUNTER — Ambulatory Visit: Payer: Self-pay | Admitting: Infectious Diseases

## 2009-07-15 ENCOUNTER — Encounter: Payer: Self-pay | Admitting: Infectious Diseases

## 2009-07-15 LAB — CONVERTED CEMR LAB
CD4 Count: 1358 microliters
HIV 1 RNA Quant: 39 copies/mL

## 2009-07-16 LAB — CONVERTED CEMR LAB
ALT: 15 units/L (ref 0–35)
CO2: 21 meq/L (ref 19–32)
Calcium: 10.7 mg/dL — ABNORMAL HIGH (ref 8.4–10.5)
Casts: NONE SEEN /lpf
Chloride: 103 meq/L (ref 96–112)
Cholesterol: 142 mg/dL (ref 0–200)
Hemoglobin, Urine: NEGATIVE
Leukocytes, UA: NEGATIVE
Nitrite: POSITIVE — AB
Sodium: 138 meq/L (ref 135–145)
Total Bilirubin: 0.3 mg/dL (ref 0.3–1.2)
Total Protein, Urine: 10
Total Protein: 8.5 g/dL — ABNORMAL HIGH (ref 6.0–8.3)
Urobilinogen, UA: 1 (ref 0.0–1.0)
VLDL: 22 mg/dL (ref 0–40)
pH: 7 (ref 5.0–8.0)

## 2009-08-20 ENCOUNTER — Telehealth: Payer: Self-pay | Admitting: Infectious Diseases

## 2009-09-03 ENCOUNTER — Encounter (INDEPENDENT_AMBULATORY_CARE_PROVIDER_SITE_OTHER): Payer: Self-pay | Admitting: *Deleted

## 2009-10-14 ENCOUNTER — Telehealth (INDEPENDENT_AMBULATORY_CARE_PROVIDER_SITE_OTHER): Payer: Self-pay | Admitting: *Deleted

## 2009-11-24 ENCOUNTER — Encounter: Payer: Self-pay | Admitting: *Deleted

## 2009-12-09 ENCOUNTER — Ambulatory Visit: Payer: Self-pay | Admitting: Infectious Diseases

## 2009-12-09 LAB — CONVERTED CEMR LAB
ALT: 17 units/L (ref 0–35)
AST: 23 units/L (ref 0–37)
Albumin: 4.4 g/dL (ref 3.5–5.2)
BUN: 19 mg/dL (ref 6–23)
CD4 Count: 1258 microliters
Calcium: 9.4 mg/dL (ref 8.4–10.5)
Chloride: 106 meq/L (ref 96–112)
HIV 1 RNA Quant: 39 copies/mL
LDL Cholesterol: 48 mg/dL (ref 0–99)
Potassium: 4.6 meq/L (ref 3.5–5.3)
Sodium: 141 meq/L (ref 135–145)
Total Protein: 7.3 g/dL (ref 6.0–8.3)

## 2009-12-23 ENCOUNTER — Ambulatory Visit: Payer: Self-pay | Admitting: Infectious Diseases

## 2010-03-13 HISTORY — PX: COLONOSCOPY: SHX174

## 2010-04-06 ENCOUNTER — Encounter: Payer: Self-pay | Admitting: Infectious Diseases

## 2010-04-06 ENCOUNTER — Ambulatory Visit
Admission: RE | Admit: 2010-04-06 | Discharge: 2010-04-06 | Payer: Self-pay | Source: Home / Self Care | Attending: Infectious Diseases | Admitting: Infectious Diseases

## 2010-04-06 LAB — CONVERTED CEMR LAB
AST: 22 units/L (ref 0–37)
Alkaline Phosphatase: 78 units/L (ref 39–117)
BUN: 17 mg/dL (ref 6–23)
Basophils Absolute: 0 10*3/uL (ref 0.0–0.1)
Basophils Relative: 1 % (ref 0–1)
Creatinine, Ser: 0.69 mg/dL (ref 0.40–1.20)
Eosinophils Relative: 2 % (ref 0–5)
Glucose, Bld: 105 mg/dL — ABNORMAL HIGH (ref 70–99)
HCT: 38.5 % (ref 36.0–46.0)
HDL: 63 mg/dL (ref >39)
Hemoglobin: 13.2 g/dL (ref 12.0–15.0)
LDL Cholesterol: 53 mg/dL (ref 0–99)
MCHC: 34.3 g/dL (ref 30.0–36.0)
MCV: 95.3 fL (ref 78.0–100.0)
Monocytes Absolute: 0.3 10*3/uL (ref 0.1–1.0)
Monocytes Relative: 4 % (ref 3–12)
Neutro Abs: 2.7 10*3/uL (ref 1.7–7.7)
Potassium: 3.2 meq/L — ABNORMAL LOW (ref 3.5–5.3)
RBC: 4.04 M/uL (ref 3.87–5.11)
RDW: 12.6 % (ref 11.5–15.5)
Total Bilirubin: 0.3 mg/dL (ref 0.3–1.2)
Total CHOL/HDL Ratio: 2.2

## 2010-04-08 ENCOUNTER — Other Ambulatory Visit: Payer: Self-pay | Admitting: Infectious Diseases

## 2010-04-08 ENCOUNTER — Ambulatory Visit
Admission: RE | Admit: 2010-04-08 | Discharge: 2010-04-08 | Payer: Self-pay | Source: Home / Self Care | Attending: Infectious Diseases | Admitting: Infectious Diseases

## 2010-04-08 ENCOUNTER — Encounter (INDEPENDENT_AMBULATORY_CARE_PROVIDER_SITE_OTHER): Payer: Self-pay | Admitting: *Deleted

## 2010-04-10 LAB — CONVERTED CEMR LAB
ALT: 14 units/L (ref 0–35)
ALT: 25 units/L (ref 0–35)
ALT: 34 units/L (ref 0–35)
AST: 23 units/L (ref 0–37)
AST: 25 units/L (ref 0–37)
Albumin: 4.2 g/dL (ref 3.5–5.2)
Albumin: 4.5 g/dL (ref 3.5–5.2)
Albumin: 4.7 g/dL (ref 3.5–5.2)
Alkaline Phosphatase: 69 units/L (ref 39–117)
Alkaline Phosphatase: 76 units/L (ref 39–117)
Alkaline Phosphatase: 88 units/L (ref 39–117)
BUN: 12 mg/dL (ref 6–23)
CD4 Count: 1608 microliters
CD4 Count: 1616 microliters
CD4 Count: 1858 microliters
CD4 Count: 893 microliters
CO2: 21 meq/L (ref 19–32)
Calcium: 10.1 mg/dL (ref 8.4–10.5)
Calcium: 9.6 mg/dL (ref 8.4–10.5)
Chloride: 109 meq/L (ref 96–112)
Creatinine, Ser: 0.65 mg/dL (ref 0.40–1.20)
Creatinine, Ser: 0.65 mg/dL (ref 0.40–1.20)
Creatinine, Urine: 21 mg/dL
Glucose, Bld: 110 mg/dL — ABNORMAL HIGH (ref 70–99)
HDL: 49 mg/dL (ref >39)
HDL: 67 mg/dL (ref >39)
HDL: 70 mg/dL (ref >39)
HIV 1 RNA Quant: 39 copies/mL
HIV 1 RNA Quant: 39 copies/mL
HIV 1 RNA Quant: 49 copies/mL
HIV 1 RNA Quant: 49 copies/mL
Hep A Total Ab: POSITIVE — AB
Hep B Core Total Ab: NEGATIVE
Hep B Core Total Ab: NEGATIVE
Hepatitis B Surface Ag: NEGATIVE
LDL Cholesterol: 53 mg/dL (ref 0–99)
LDL Cholesterol: 56 mg/dL (ref 0–99)
LDL Cholesterol: 74 mg/dL (ref 0–99)
LDL Cholesterol: 81 mg/dL (ref 0–99)
Potassium: 3.3 meq/L — ABNORMAL LOW (ref 3.5–5.3)
Potassium: 4 meq/L (ref 3.5–5.3)
Potassium: 4.3 meq/L (ref 3.5–5.3)
Sodium: 136 meq/L (ref 135–145)
Sodium: 141 meq/L (ref 135–145)
Sodium: 141 meq/L (ref 135–145)
Total Bilirubin: 0.3 mg/dL (ref 0.3–1.2)
Total Bilirubin: 0.3 mg/dL (ref 0.3–1.2)
Total CHOL/HDL Ratio: 2.3
Total CHOL/HDL Ratio: 2.3
Total CHOL/HDL Ratio: 2.5
Total CHOL/HDL Ratio: 2.6
Total Protein, Urine: 4
Total Protein: 7.9 g/dL (ref 6.0–8.3)
Total Protein: 7.9 g/dL (ref 6.0–8.3)
Total Protein: 8.5 g/dL — ABNORMAL HIGH (ref 6.0–8.3)
Triglycerides: 76 mg/dL (ref <150)
VLDL: 13 mg/dL (ref 0–40)
VLDL: 15 mg/dL (ref 0–40)
VLDL: 27 mg/dL (ref 0–40)

## 2010-04-12 NOTE — Medication Information (Signed)
Summary: Medical Modalities: RX  Medical Modalities: RX   Imported By: Florinda Marker 03/30/2009 10:02:09  _____________________________________________________________________  External Attachment:    Type:   Image     Comment:   External Document

## 2010-04-12 NOTE — Miscellaneous (Signed)
Summary: Medical Modalites:CMN  Medical Modalites:CMN   Imported By: Florinda Marker 05/06/2009 14:10:13  _____________________________________________________________________  External Attachment:    Type:   Image     Comment:   External Document

## 2010-04-12 NOTE — Assessment & Plan Note (Signed)
Summary: STUDY APPT/ LH    Current Allergies: No known allergies  Vital Signs:  Patient profile:   70 year old female Menstrual status:  hysterectomy Weight:      153 pounds (69.55 kg) BMI:     24.60 Temp:     98.2 degrees F oral Pulse rate:   74 / minute BP sitting:   163 / 86  (left arm) Is Patient Diabetic? No Pain Assessment Patient in pain? yes      Onset of pain  chronic joint pain and stiffness Nutritional Status BMI of 19 -24 = normal  Does patient need assistance? Functional Status Self care Ambulation Impaired:Risk for fall   Patient here for week 736 ALLRT study visit. She denies any new problems, but continues to have chronic joint pain and stiffness. She still has some numbness in her left big toe. Will return in January 2012.Deirdre Evener RN  December 09, 2009 9:38 AM    Other Orders: Est. Patient Research Study [phone]) T-Comprehensive Metabolic Panel [phone]) T-Lipid Profile [phone])

## 2010-04-12 NOTE — Miscellaneous (Signed)
Summary: HIV-1 RNA, CD4 (RESEARCH)  Clinical Lists Changes  Observations: Added new observation of CD4 COUNT: 1258 microliters (12/09/2009 11:32) Added new observation of HIV1RNA QA: 39 copies/mL (12/09/2009 11:32)

## 2010-04-12 NOTE — Assessment & Plan Note (Signed)
Summary: Medication order updated
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
## 2010-04-12 NOTE — Miscellaneous (Signed)
Summary: Immunization Entry   Immunization History:  Influenza Immunization History:    Influenza:  fluvax non-mcr (11/23/2009) Patient states she got her flu vaccine yesterday at her local drug store.Deirdre Evener RN  November 24, 2009 3:35 PM

## 2010-04-12 NOTE — Assessment & Plan Note (Signed)
   Process Orders Check Orders Results:     [facility]: ABN not required for this insurance Order queued for requisitioning for [facility]: Jul 15, 2009 9:02 AM  Tests Sent for requisitioning (Jul 15, 2009 9:02 AM):     07/15/2009: [facility] -- T-Urinalysis [[id number]] (signed)    Appended Document: Orders Update    Clinical Lists Changes  Orders: Added new Test order of T-Urine Culture ([facility] Order) ([id number]) - Signed      Process Orders Check Orders Results:     [facility]: ABN not required for this insurance Order queued for requisitioning for [facility]: Jul 16, 2009 10:54 AM  Tests Sent for requisitioning (Jul 16, 2009 10:54 AM):     07/16/2009: [facility] -- T-Urine Culture ([facility] Order) [phone] [id number] (signed)

## 2010-04-12 NOTE — Assessment & Plan Note (Signed)
Summary: STUDY APPT/ LH    Current Allergies: No known allergies  Vital Signs:  Patient profile:   70 year old female Menstrual status:  hysterectomy Weight:      146.5 pounds (66.59 kg) BMI:     23.55 Temp:     97.9 degrees F oral Pulse rate:   71 / minute BP sitting:   207 / 77  (left arm) Is Patient Diabetic? No Pain Assessment Patient in pain? yes     Location: shoulder Onset of pain  chronic hip/shoulder Nutritional Status BMI of 19 -24 = normal  Does patient need assistance? Functional Status Self care Ambulation Normal   Patient here for week 720 ALLRT visit.  C/O lower left abd soreness, started last Saturday. She doesn't think it is her diverticulitis, but urinary and had started some AZO for pain. Will do U/A to check. Aching in her shoulders, rt hip and lt foot continue. 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 Patient Research Study [id number]) T-Comprehensive Metabolic Panel [id number]) T-Lipid Profile [id number]) T-Urine Protein [id number]) T-Urine Creatinine [phone])  Process Orders Check Orders Results:     [facility]: ABN not required for this insurance Tests Sent for requisitioning (Jul 15, 2009 10:00 AM):     07/15/2009: [facility] -- T-Comprehensive Metabolic Panel [[id number]] (signed)     07/15/2009: [facility] -- T-Lipid Profile [phone] (signed)     07/15/2009: [facility] -- T-Urine Protein [id number] (signed)     07/15/2009: [facility] -- T-Urine Creatinine [[id number]] (signed)
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
## 2010-04-12 NOTE — Progress Notes (Signed)
Summary: refills  Phone Note Call from Patient Call back at Home Phone [phone]   Caller: Patient Reason for Call: Refill Medication Summary of Call: Please refill pain medications when it is time and call the pt. when scripts are ready.  Jennet Maduro RN  October 14, 2009 8:46 AM     Prescriptions: OXYCONTIN 20 MG  TB12 (OXYCODONE HCL) Take 1 tablet by mouth every 12 hours as needed for pain  #90 x 0   Entered by:   Jennet Maduro RN   Authorized by:   [name] MD   Signed by:   Jennet Maduro RN on 10/14/2009   Method used:   Print then Give to Patient   RxID:   [id number] ACYCLOVIR 400 MG  TABS (ACYCLOVIR) one tablet 3 times daily for y days as needed for herpes breakout  #21 x prn   Entered by:   Jennet Maduro RN   Authorized by:   [name] MD   Signed by:   Jennet Maduro RN on 10/14/2009   Method used:   Electronically to        [facility] [address] (retail)       [address]       [place], [place]  [address]       Ph: [phone] or [phone]       Fax: [phone]   RxID:   [id number] CYCLOBENZAPRINE HCL 10 MG  TABS (CYCLOBENZAPRINE HCL) one tablet every 8 hours  #90 x prn   Entered by:   Jennet Maduro RN   Authorized by:   [name] MD   Signed by:   Jennet Maduro RN on 10/14/2009   Method used:   Electronically to        [facility] [address] (retail)       [address]       [place], [place]  [address]       Ph: [phone] or [phone]       Fax: [phone]   RxID:   [id number] LOVASTATIN 40 MG TABS (LOVASTATIN) Take 1 tablet by mouth once a day  #30 x prn   Entered by:   Jennet Maduro RN   Authorized by:   [name] MD   Signed by:   Jennet Maduro RN on 10/14/2009   Method used:   Electronically to        [facility] [address] (retail)       [address]       [place], [place]  [address]    Ph: [phone] or [phone]       Fax: [phone]   RxID:   [id number]

## 2010-04-12 NOTE — Progress Notes (Signed)
  Phone Note Call from Patient   Reason for Call: Talk to Nurse Summary of Call: Requesting refills for her oxycontin. She wants to make sure she gets them before her trip next week.  Notified Dr. Maurice March who has written new scrips.Deirdre Evener RN  August 20, 2009 2:50 PM Initial call taken by: Deirdre Evener RN,  August 20, 2009 2:50 PM

## 2010-04-12 NOTE — Progress Notes (Signed)
Summary: narcotic refill  Phone Note Call from Patient Call back at Home Phone [phone]   Caller: Patient Reason for Call: Refill Medication Summary of Call: Call for refill of monthly narcotics. Jennet Maduro RN  June 14, 2009 9:46 AM   Follow-up for Phone Call       Follow-up by: Cliffton Asters MD,  June 15, 2009 2:41 PM    Prescriptions: OXYCONTIN 20 MG  TB12 (OXYCODONE HCL) one every 12 hours as needed for pain, DO NOT FILL UNTIL 06/13/2008  #60 x 0   Entered by:   Cliffton Asters MD   Authorized by:   Clydie Braun MD   Signed by:   Cliffton Asters MD on 06/15/2009   Method used:   Print then Give to Patient   RxID:   [id number] OXYCONTIN 20 MG  TB12 (OXYCODONE HCL) one every 12 hours as needed for pain, DO NOT FILL UNTIL 06/13/2008  #60 x 0   Entered by:   Cliffton Asters MD   Authorized by:   Clydie Braun MD   Signed by:   Cliffton Asters MD on 06/15/2009   Method used:   Print then Give to Patient   RxID:   [id number] HYDROCODONE-ACETAMINOPHEN 10-660 MG  TABS (HYDROCODONE-ACETAMINOPHEN) Take 1 tablet by mouth every 4 hours as needed pain  #90 x 2   Entered by:   Jennet Maduro RN   Authorized by:   Cliffton Asters MD   Signed by:   Jennet Maduro RN on 06/14/2009   Method used:   Print then Give to Patient   RxID:   [id number] OXYCONTIN 20 MG  TB12 (OXYCODONE HCL) one every 12 hours as needed for pain, DO NOT FILL UNTIL 06/13/2008  #60 x 0   Entered by:   Jennet Maduro RN   Authorized by:   Cliffton Asters MD   Signed by:   Jennet Maduro RN on 06/14/2009   Method used:   Print then Give to Patient   RxID:   [id number] HYDROCODONE-ACETAMINOPHEN 10-660 MG  TABS (HYDROCODONE-ACETAMINOPHEN) Take 1 tablet by mouth every 4 hours as needed pain  #90 x 2   Entered by:   Jennet Maduro RN   Authorized by:   Cliffton Asters MD   Signed by:   Jennet Maduro RN on 06/14/2009   Method used:   Print then Give to Patient   RxID:    [id number] OXYCONTIN 20 MG  TB12 (OXYCODONE HCL) one every 12 hours as needed for pain, DO NOT FILL UNTIL 06/13/2008  #60 x 0   Entered by:   Jennet Maduro RN   Authorized by:   Cliffton Asters MD   Signed by:   Jennet Maduro RN on 06/14/2009   Method used:   Print then Give to Patient   RxID:   [id number]  Attempted to print on RX paper withouth success.  Ignore above 2 attempts.  Jennet Maduro RN  June 14, 2009 4:18 PM

## 2010-04-12 NOTE — Miscellaneous (Signed)
Summary: HIV-1 RNA, CD4 (RESEARCH)  Clinical Lists Changes  Observations: Added new observation of CD4 COUNT: 1358 microliters (07/15/2009 10:46) Added new observation of HIV1RNA QA: 39 copies/mL (07/15/2009 10:46)

## 2010-04-12 NOTE — Assessment & Plan Note (Signed)
Summary: F/U APPT/VS   CC:  f/u and need rf's on med.  History of Present Illness: Sararh's HIV is under fabulous control with HIV 40 and CD4 .1400. Clinically the most difficult is her chronic back pain from degenerative disease and will undergo TENS. She has had no peripehral vascular problems. Will continue present meds and refill chronic meds for above and HTN.   Preventive Screening-Counseling & Management  Alcohol-Tobacco     Alcohol drinks/day: 0     Smoking Status: quit < 6 months     Smoking Cessation Counseling: yes     Smoke Cessation Stage: contemplative     Packs/Day: 0.25     Cans of tobacco/week: no     Passive Smoke Exposure: yes  Caffeine-Diet-Exercise     Caffeine use/day: yes     Does Patient Exercise: requesting referral for PT  Hep-HIV-STD-Contraception     HIV Risk: no risk noted  Safety-Violence-Falls     Seat Belt Use: yes      Sexual History:  not in a relationship.        Drug Use:  former.     Current Allergies (reviewed today): No known allergies  Vital Signs:  Patient profile:   70 year old female Menstrual status:  hysterectomy Height:      66.25 inches (168.28 cm) Weight:      143.50 pounds (65.23 kg) BMI:     23.07 Temp:     97.1 degrees F oral Pulse rate:   70 / minute BP sitting:   150 / 90  (left arm)  Vitals Entered By: Starleen Arms CMA (March 25, 2009 10:13 AM) CC: f/u, need rf's on med Is Patient Diabetic? No Pain Assessment Patient in pain? no      Nutritional Status BMI of 19 -24 = normal Nutritional Status Detail nl  Does patient need assistance? Functional Status Self care Ambulation Normal   Physical Exam  General:  alert and well-developed.   Mouth:  good dentition, no gingival abnormalities, and pharynx pink and moist.   Lungs:  normal respiratory effort and no intercostal retractions.   Heart:  normal rate.   Msk:    decreased ROM.   Neurologic:  alert & oriented X3.     Other Orders: Est.  Patient Level III (33295)  Patient Instructions: 1)  Please schedule a follow-up appointment in 4-5 months.

## 2010-04-12 NOTE — Miscellaneous (Signed)
Summary: clinical update/ryan white  Clinical Lists Changes  Observations: Added new observation of PCTFPL: 114.64  (09/03/2009 9:18) Added new observation of FINASSESSDT: 08/26/2009  (09/03/2009 9:18)

## 2010-04-12 NOTE — Miscellaneous (Signed)
Summary: Medical Modalities: CMN  Medical Modalities: CMN   Imported By: Florinda Marker 05/13/2009 10:49:59  _____________________________________________________________________  External Attachment:    Type:   Image     Comment:   External Document

## 2010-04-12 NOTE — Progress Notes (Signed)
Summary: Alprazolam and oxycontin refills  Phone Note Call from Patient   Caller: Patient Call For: [name] MD Summary of Call: Patient called requesting oxycontin and alprazalam. Initial call taken by: Starleen Arms CMA,  Jul 13, 2009 11:08 AM    New/Updated Medications: OXYCONTIN 20 MG  TB12 (OXYCODONE HCL) Take 1 tablet by mouth every 12 hours as needed for pain Prescriptions: ALPRAZOLAM 1 MG  TB24 (ALPRAZOLAM) one tablet evry 9 hours as needed  #90 x 2   Entered by:   Jennet Maduro RN   Authorized by:   Johny Sax MD   Signed by:   Jennet Maduro RN on 07/14/2009   Method used:   Print then Give to Patient   RxID:   [id number] OXYCONTIN 20 MG  TB12 (OXYCODONE HCL) Take 1 tablet by mouth every 12 hours as needed for pain  #90 x 0   Entered by:   Jennet Maduro RN   Authorized by:   Johny Sax MD   Signed by:   Jennet Maduro RN on 07/14/2009   Method used:   Print then Give to Patient   RxID:   [id number]  Pt. notified.  Will pick up tomorrow. Jennet Maduro RN  Jul 14, 2009 11:54 AM

## 2010-04-12 NOTE — Miscellaneous (Signed)
Summary: Medical Modalities: CMN  Medical Modalities: CMN   Imported By: Florinda Marker 05/11/2009 09:00:57  _____________________________________________________________________  External Attachment:    Type:   Image     Comment:   External Document

## 2010-04-13 ENCOUNTER — Encounter (INDEPENDENT_AMBULATORY_CARE_PROVIDER_SITE_OTHER): Payer: Self-pay | Admitting: *Deleted

## 2010-04-13 ENCOUNTER — Other Ambulatory Visit: Payer: Self-pay | Admitting: Infectious Diseases

## 2010-04-13 DIAGNOSIS — Z139 Encounter for screening, unspecified: Secondary | ICD-10-CM

## 2010-04-14 NOTE — Assessment & Plan Note (Signed)
Summary: study appt. [mkj]    Current Allergies: No known allergies  Vital Signs:  Patient profile:   70 year old female Menstrual status:  hysterectomy Weight:      148.5 pounds (67.50 kg) BMI:     23.87 Temp:     98.2 degrees F oral Pulse rate:   87 / minute BP sitting:   160 / 75  (left arm) Is Patient Diabetic? No Pain Assessment Patient in pain? yes      Type: chronic joint Nutritional Status BMI of 19 -24 = normal  Does patient need assistance? Functional Status Self care Ambulation Normal   Patient here for week 752 ALLRT study visit. She denies any new current problems, but continues to have alot of joint pain. She had signs suggestive of "flu" in [place] with chest/head congestion, bodyaches, fever, chills, etc. which she said persisted for along time, but has now resolved.Deirdre Evener RN  April 06, 2010 4:55 PM    Other Orders: Est. Patient Research Study [id number]) T-CBC w/Diff [phone]) T-Comprehensive Metabolic Panel [phone]) T-Lipid Profile [phone])

## 2010-04-14 NOTE — Letter (Signed)
Summary: [facility]  [address]   [place], [place] [address]   Phone: [phone]  Fax: [phone]         April 08, 2010  Jessica [name] [address] [place], [place]  [address]  Dear Jessica [name],  Here is the information about the appointment for your Colonoscopy:  Screening colonoscopy Referral Appointment Information Day:  Wednesday Dorna Bloom:  Feb. 15, 2012 Time:  1:30 PM Place:  [facility] Gastroenterology/Nurse Practitioner Address: [address] [place], [place] [address] Phone:  [phone]  It is also time to schedule your yearly mammogram.  Please make this appointment at your earliest convenience and have the results sent to Dr. Maurice March.  I will mail your PAP smear results as soon as they are available.  Thank you for coming to the Center for your care.  Sincerely,    Jennet Maduro [facility]

## 2010-04-19 ENCOUNTER — Ambulatory Visit (INDEPENDENT_AMBULATORY_CARE_PROVIDER_SITE_OTHER): Payer: Medicare Other | Admitting: Vascular Surgery

## 2010-04-19 DIAGNOSIS — I739 Peripheral vascular disease, unspecified: Secondary | ICD-10-CM

## 2010-04-19 DIAGNOSIS — Z48812 Encounter for surgical aftercare following surgery on the circulatory system: Secondary | ICD-10-CM

## 2010-04-19 DIAGNOSIS — I70219 Atherosclerosis of native arteries of extremities with intermittent claudication, unspecified extremity: Secondary | ICD-10-CM

## 2010-04-20 NOTE — Assessment & Plan Note (Addendum)
Summary: 0900 PAP smear    Vital Signs:  Patient profile:   70 year old female Menstrual status:  hysterectomy CC: PAP smear visit.  Pt. offered condoms.  Pt. given educational materials re:  HIV and women, diet, exercise, aging with HIV, nutrition, cholesterol and self-esteem.     Menstrual Status hysterectomy Last PAP Result  Hysterectomy,1971, last PAP smear needed, 2010   Medical History Prior Medications: ATRIPLA 600-200-300 MG TABS (EFAVIRENZ-EMTRICITAB-TENOFOVIR) Take 1 tablet by mouth once a day LOVASTATIN 40 MG TABS (LOVASTATIN) Take 1 tablet by mouth once a day AUG BETAMETHASONE DIPROPIONATE 0.05 %  OINT (AUG BETAMETHASONE DIPROPIONATE) use as directed ALPRAZOLAM 1 MG  TB24 (ALPRAZOLAM) one tablet evry 9 hours as needed OXYCONTIN 20 MG  TB12 (OXYCODONE HCL) Take 1 tablet by mouth every 12 hours as needed for pain ZYBAN 150 MG  TB12 (BUPROPION HCL (SMOKING DETER)) take 150 mg each morning for 3 days then 150mg  two times a day for one month.   Take last dose by 5 pm BENAZEPRIL-HYDROCHLOROTHIAZIDE 20-12.5 MG  TABS (BENAZEPRIL-HYDROCHLOROTHIAZIDE) 2  tablet daily CYCLOBENZAPRINE HCL 10 MG  TABS (CYCLOBENZAPRINE HCL) one tablet every 8 hours HYDROCODONE-ACETAMINOPHEN 10-660 MG  TABS (HYDROCODONE-ACETAMINOPHEN) Take 1 tablet by mouth every 4 hours as needed pain ACYCLOVIR 400 MG  TABS (ACYCLOVIR) one tablet 3 times daily for y days as needed for herpes breakout TRIAMCINOLONE ACETONIDE 0.1 %  CREA (TRIAMCINOLONE ACETONIDE) apply to affected area 3 times daily for skin rashes BETAMETHASONE DIPROPIONATE AUG 0.05 % OINT (BETAMETHASONE DIPROPIONATE AUG) apply to rash once daily CIPRO 500 MG TABS (CIPROFLOXACIN HCL) Take 1 tablet by mouth two times a day for 10 days Current Allergies: No known allergies  Orders Added: 1)  T-PAP [facility]) [[address]] 2)  Est. Patient Level I [[address]] 3)  Gastroenterology Referral [GI]

## 2010-04-20 NOTE — Letter (Signed)
Summary: Results Follow-up Letter  [facility]  [address]   [place], [place] [address]   Phone: [phone]  Fax: [phone]          April 13, 2010  [address] [place], [place]  [address]  Dear [name],   The following are the results of your recent test(s):  Test     Result     Pap Smear    Normal_XXX___  Not Normal_____  Comments:  I will see you in one year for your next PAP smear.  Thank you for coming to the Center for your care.  Sincerely,    Jennet Maduro [facility]

## 2010-04-22 ENCOUNTER — Encounter (INDEPENDENT_AMBULATORY_CARE_PROVIDER_SITE_OTHER): Payer: Self-pay | Admitting: *Deleted

## 2010-04-22 NOTE — Assessment & Plan Note (Signed)
OFFICE VISIT  Jessica [name], Jessica [name] DOB:  10/03/1940                                       04/19/2010 XLKGM#:[id number]  The patient returns today because of some numbness in her left first toe and a history of vascular occlusive disease.  I performed an aortobifemoral bypass and a left femoral endarterectomy on her in 2009 with severe claudication symptoms which have completely resolved.  She states that she has had some numbness in the first toe for a few years and occasionally notices some black discoloration around the left first toenail area, but has had no history of gangrene, ulceration, or infection.  CHRONIC MEDICAL PROBLEMS: 1. Hypertension. 2. Hyperlipidemia. 3. HIV-positive. 4. Tobacco abuse. 5. Negative for coronary artery disease, diabetes, or stroke.  SOCIAL HISTORY:  She is widowed, has 4 children, is disabled, has smoked up to a pack of cigarettes per day for 45 years, does not use alcohol on a regular basis.  FAMILY HISTORY:  Positive diabetes in mother and father; stroke in father.  REVIEW OF SYSTEMS:  Negative for chest pain, dyspnea on exertion, PND, orthopnea, pneumonia, chronic bronchitis, hemoptysis.  Denies all symptoms in a complete review of systems.  PHYSICAL EXAM:  Blood pressure 122/60, heart rate 72, respirations 20. General:  Well-developed, well-nourished, thin female in no apparent distress, alert and oriented x3.  HEENT exam normal for age.  EOMs intact.  Lungs:  Clear to auscultation.  No rhonchi or wheezing. Cardiovascular:  Regular rhythm, no murmurs.  Carotid pulses 3+, no bruits.  Abdomen:  Soft, nontender, no masses.  No evidence of ventral hernia.  Musculoskeletal exam is free of major deformities.  Neurologic exam is normal.  Skin is free of rashes.  Lower extremity exam reveals 3+ femoral, popliteal, and dorsalis pedis pulses bilaterally.  The left foot was carefully examined.  There was no evidence  of infection, ulceration, or gangrene either beneath the nail or adjacent to it.  Today, I ordered lower extremity arterial Dopplers which revealed triphasic flow in both feet with ABIs of between 0.95 and 0.99 bilaterally.  I have reassured her regarding these findings and if she has further questions regarding numbness in the toes, she should see a podiatrist because her vasculature is excellent.    Quita Skye Hart [place], M.D. Electronically Signed  JDL/MEDQ  D:  04/19/2010  T:  04/20/2010  Job:  [id number]

## 2010-04-27 ENCOUNTER — Encounter: Payer: Self-pay | Admitting: Gastroenterology

## 2010-04-27 ENCOUNTER — Ambulatory Visit (INDEPENDENT_AMBULATORY_CARE_PROVIDER_SITE_OTHER): Payer: Medicare Other | Admitting: Gastroenterology

## 2010-04-27 ENCOUNTER — Encounter: Payer: Self-pay | Admitting: Internal Medicine

## 2010-04-27 DIAGNOSIS — Z1211 Encounter for screening for malignant neoplasm of colon: Secondary | ICD-10-CM

## 2010-04-27 DIAGNOSIS — K5903 Drug induced constipation: Secondary | ICD-10-CM

## 2010-04-27 DIAGNOSIS — K5900 Constipation, unspecified: Secondary | ICD-10-CM

## 2010-04-27 HISTORY — DX: Drug induced constipation: K59.03

## 2010-04-28 ENCOUNTER — Ambulatory Visit (HOSPITAL_COMMUNITY): Payer: Medicare Other

## 2010-04-28 ENCOUNTER — Ambulatory Visit (HOSPITAL_COMMUNITY)
Admission: RE | Admit: 2010-04-28 | Discharge: 2010-04-28 | Disposition: A | Discharge status: Home / Self Care | Payer: Medicare Other | Source: Ambulatory Visit | Attending: Infectious Diseases | Admitting: Infectious Diseases

## 2010-04-28 DIAGNOSIS — Z1231 Encounter for screening mammogram for malignant neoplasm of breast: Secondary | ICD-10-CM | POA: Insufficient documentation

## 2010-04-28 DIAGNOSIS — Z139 Encounter for screening, unspecified: Secondary | ICD-10-CM

## 2010-04-28 NOTE — Miscellaneous (Signed)
Summary: adap update   Clinical Lists Changes  Observations: Added new observation of AIDSDAP: Pending-approval for 2012 (04/22/2010 8:39)

## 2010-05-04 NOTE — Letter (Signed)
Summary: TCS ORDER  TCS ORDER   Imported By: Ave Filter 04/27/2010 15:16:34  _____________________________________________________________________  External Attachment:    Type:   Image     Comment:   External Document

## 2010-05-04 NOTE — Assessment & Plan Note (Signed)
Summary: CONSULT FOR SCREENING TCS/LAW   Vital Signs:  Patient profile:   70 year old female Menstrual status:  hysterectomy Height:      66.25 inches Weight:      150 pounds BMI:     24.12 Temp:     98.6 degrees F oral Pulse rate:   84 / minute BP sitting:   150 / 80  (left arm) Cuff size:   large  Vitals Entered By: Cloria Spring LPN (April 27, 2010 1:40 PM)  Visit Type:  Initial Consult Referring Provider:  [name] Primary Care Provider:  [name] MD  CC:  Consult for TCS.  History of Present Illness: [name] presents today for a consult for a screening colonoscopy. Last one done in 2006 by Dr. Randa Evens, showing internal hemorrhoids and moderated diverticulosis. Denies abdominal pain. Does state she experienced abdominal pain approximately 1 month ago, lasting for 2 weeks. located in LLQ, described as sharp, exacerbated by movement. No fever or chills. States bowel habits are "crazy". Experiences intermittent constipation, sometimes so severe that she almost passes out from trying to strain, has hot flashes. Can go about a month without having a BM. No melena or hematochezia. Takes stool softeners. No laxatives. No weight loss or lack of appetite.   Current Medications (verified): 1)  Atripla 600-200-300 Mg Tabs (Efavirenz-Emtricitab-Tenofovir) .... Take 1 Tablet By Mouth Once A Day 2)  Lovastatin 40 Mg Tabs (Lovastatin) .... Take 1 Tablet By Mouth Once A Day 3)  Aug Betamethasone Dipropionate 0.05 %  Oint (Aug Betamethasone Dipropionate) .... Use As Directed 4)  Alprazolam 1 Mg  Tb24 (Alprazolam) .... One Tablet Evry 9 Hours As Needed 5)  Oxycontin 20 Mg  Tb12 (Oxycodone Hcl) .... Take 1 Tablet By Mouth Every 12 Hours As Needed For Pain 6)  Zyban 150 Mg  Tb12 (Bupropion Hcl (Smoking Deter)) .... Take 150 Mg Each Morning For 3 Days Then 150mg  Two Times A Day For One Month.   Take Last Dose By 5 Pm 7)  Benazepril-Hydrochlorothiazide 20-12.5 Mg  Tabs  (Benazepril-Hydrochlorothiazide) .... 2  Tablet Daily 8)  Cyclobenzaprine Hcl 10 Mg  Tabs (Cyclobenzaprine Hcl) .... One Tablet Every 8 Hours 9)  Hydrocodone-Acetaminophen 10-660 Mg  Tabs (Hydrocodone-Acetaminophen) .... Take 1 Tablet By Mouth Every 6 Hours As Needed Pain 10)  Acyclovir 400 Mg  Tabs (Acyclovir) .... One Tablet 3 Times Daily For Y Days As Needed For Herpes Breakout 11)  Triamcinolone Acetonide 0.1 %  Crea (Triamcinolone Acetonide) .... Apply To Affected Area 3 Times Daily For Skin Rashes 12)  Betamethasone Dipropionate Aug 0.05 % Oint (Betamethasone Dipropionate Aug) .... Apply To Rash Once Daily 13)  Hydrocodone-Acetaminophen 7.5-750 Mg Tabs (Hydrocodone-Acetaminophen) .... One Tablet Every 4 Hours 14)  Travatan Z 0.004 % Soln (Travoprost) .... As Directed 15)  Aspirin 81 Mg Tbec (Aspirin) .... One Tablet Daily 16)  Multivitamin .... Once Daily 17)  Calcium With Vit D-3 .... Take 1 Tablet By Mouth Two Times A Day  Allergies (verified): No Known Drug Allergies  Past History:  Past Medical History: hx HIV, unsure how contracted Herniated disc L3-L4 Degenerated disc disease L5-L6 Glaucoma Fungal Infection Genital Herpes Hx of abnormal PAP Depression Hypertension Hypercholesterolemia  Past Surgical History: Hysterectomy, partial in 1970, complete 1972 Cholecystectomy 2009:  Aortobifemoral bypass grafting   Family History: Mother: DM, HTN, renal failure deceased Father: glaucoma, some form of ca, deceased unsure if has FH of colon ca or not  Social History: Occupation: Retired, Press photographer  driver, Conservator, museum/gallery, accounting Widowed: since 2006, daughter lives with pt 4 children, 2 miscarriages Patient currently smokes. 1 pack every 3 days Alcohol Use - no Illicit Drug Use - no Smoking Status:  current Drug Use:  no  Review of Systems General:  Denies fever, chills, and anorexia. Eyes:  Denies blurring, irritation, and discharge. ENT:  Denies sore throat,  hoarseness, and difficulty swallowing. CV:  Denies chest pains and syncope. Resp:  Denies dyspnea at rest and wheezing. GI:  See HPI. GU:  Denies urinary burning and urinary frequency. MS:  Denies joint pain / LOM, joint swelling, and joint stiffness. Derm:  Denies rash, itching, and dry skin. Neuro:  Denies weakness and syncope. Psych:  Denies depression and anxiety. Endo:  Denies cold intolerance and heat intolerance.  Physical Exam  General:  Well developed, well nourished, no acute distress. Head:  Normocephalic and atraumatic. Eyes:  PERRLA, no icterus. Mouth:  No deformity or lesions, dentition normal. Lungs:  Clear throughout to auscultation. Heart:  Regular rate and rhythm; no murmurs, rubs,  or bruits. Abdomen:  +BS, soft, non-tender, non-distended. no HSM. large midline scar well-healed. No rebound or guarding.  Msk:  Symmetrical with no gross deformities. Normal posture. Pulses:  Normal pulses noted. Neurologic:  Alert and  oriented x4;  grossly normal neurologically. Psych:  Alert and cooperative. Normal mood and affect.   Impression & Recommendations:  Problem # 1:  SCREENING COLORECTAL-CANCER (ICD-V7.39)  70 year old [place] American female who presents in consult for updated colonoscopy. Last done in 2006 with moderate diverticulosis, internal hemorrhoids. has significant bouts of constipation, sometimes lasting 1 month between BMs. hard BMs, but no melena/hematochezia. On chronic pain medication, likely exacerbating constipation. hx of HIV, stable.   TCS in OR with Dr. Jena Gauss in near future, secondary to polypharmacy: the R/B/A have been discussed in detail. Pt states understanding and desires to proceed. Colace two times a day Miralax daily as needed  High fiber diet 6-8 glasses of water daily further recs to follow  Orders: Consultation Level III (16109)  Problem # 2:  CONSTIPATION (ICD-564.00)  See # 1  Orders: Consultation Level III  (60454)  Patient Instructions: 1)  Take stool softener at morning and night, hold if diarrhea. May take once a day if needed.  2)  Miralax as needed for constipation, may take daily 3)  High fiber diet  4)  Drink 6-8 glasses of water/day 5)  The medication list was reviewed and reconciled.  All changed / newly prescribed medications were explained.  A complete medication list was provided to the patient / caregiver.    Orders Added: 1)  Consultation Level III [09811]

## 2010-05-05 ENCOUNTER — Ambulatory Visit: Payer: Self-pay | Admitting: Infectious Diseases

## 2010-05-06 ENCOUNTER — Encounter (INDEPENDENT_AMBULATORY_CARE_PROVIDER_SITE_OTHER): Payer: Self-pay | Admitting: *Deleted

## 2010-05-09 ENCOUNTER — Encounter (HOSPITAL_COMMUNITY): Payer: Medicare Other

## 2010-05-09 ENCOUNTER — Other Ambulatory Visit: Payer: Self-pay | Admitting: Internal Medicine

## 2010-05-09 DIAGNOSIS — Z01810 Encounter for preprocedural cardiovascular examination: Secondary | ICD-10-CM | POA: Insufficient documentation

## 2010-05-09 DIAGNOSIS — Z01812 Encounter for preprocedural laboratory examination: Secondary | ICD-10-CM | POA: Insufficient documentation

## 2010-05-09 LAB — CBC
HCT: 38.7 % (ref 36.0–46.0)
Hemoglobin: 13 g/dL (ref 12.0–15.0)
MCH: 32.6 pg (ref 26.0–34.0)
MCHC: 33.6 g/dL (ref 30.0–36.0)

## 2010-05-09 LAB — BASIC METABOLIC PANEL
CO2: 26 mEq/L (ref 19–32)
Chloride: 103 mEq/L (ref 96–112)
Glucose, Bld: 105 mg/dL — ABNORMAL HIGH (ref 70–99)
Potassium: 3.4 mEq/L — ABNORMAL LOW (ref 3.5–5.1)
Sodium: 140 mEq/L (ref 135–145)

## 2010-05-10 NOTE — Miscellaneous (Signed)
Summary: NCADAP approved til 12/11/10  Clinical Lists Changes  Observations: Added new observation of AIDSDAP: Yes 2012 (05/06/2010 17:20)

## 2010-05-12 ENCOUNTER — Other Ambulatory Visit: Payer: Self-pay | Admitting: Internal Medicine

## 2010-05-12 ENCOUNTER — Ambulatory Visit (HOSPITAL_COMMUNITY)
Admission: RE | Admit: 2010-05-12 | Discharge: 2010-05-12 | Disposition: A | Discharge status: Home / Self Care | Payer: Medicare Other | Source: Ambulatory Visit | Attending: Internal Medicine | Admitting: Internal Medicine

## 2010-05-12 ENCOUNTER — Encounter: Payer: Medicare Other | Admitting: Internal Medicine

## 2010-05-12 DIAGNOSIS — K5730 Diverticulosis of large intestine without perforation or abscess without bleeding: Secondary | ICD-10-CM

## 2010-05-12 DIAGNOSIS — Z800 Family history of malignant neoplasm of digestive organs: Secondary | ICD-10-CM | POA: Insufficient documentation

## 2010-05-12 DIAGNOSIS — D126 Benign neoplasm of colon, unspecified: Secondary | ICD-10-CM

## 2010-05-12 DIAGNOSIS — I10 Essential (primary) hypertension: Secondary | ICD-10-CM | POA: Insufficient documentation

## 2010-05-12 DIAGNOSIS — Z1211 Encounter for screening for malignant neoplasm of colon: Secondary | ICD-10-CM | POA: Insufficient documentation

## 2010-05-12 DIAGNOSIS — Z01812 Encounter for preprocedural laboratory examination: Secondary | ICD-10-CM | POA: Insufficient documentation

## 2010-05-12 DIAGNOSIS — E669 Obesity, unspecified: Secondary | ICD-10-CM

## 2010-05-12 DIAGNOSIS — Z01818 Encounter for other preprocedural examination: Secondary | ICD-10-CM | POA: Insufficient documentation

## 2010-05-12 DIAGNOSIS — Z7982 Long term (current) use of aspirin: Secondary | ICD-10-CM | POA: Insufficient documentation

## 2010-05-17 ENCOUNTER — Encounter: Payer: Self-pay | Admitting: Internal Medicine

## 2010-05-18 NOTE — Op Note (Addendum)
  NAME:  Jessica [name], Jessica [name]       ACCOUNT NO.:  [id number]  MEDICAL RECORD NO.:  [id number]           PATIENT TYPE:  O  LOCATION:  [facility]                          FACILITY:  [facility]  PHYSICIAN:  R. Roetta Sessions, M.D. DATE OF BIRTH:  19-Dec-1940  DATE OF PROCEDURE:  05/12/2010 DATE OF DISCHARGE:                              OPERATIVE REPORT   PROCEDURE:  Colonoscopy with biopsy.  INDICATIONS FOR PROCEDURE:  A 69-year lady, referred at courtesy of Dr. [name], in [facility] for colorectal cancer screening.  Last colonoscopy is 2006, found to have diverticulosis at that time.  Chronic constipation ongoing complaint being treated.  Family history of colon cancer and first relative (father) but at advanced age (in his 51s). Colonoscopy is now being done as screening maneuver.  Risks, benefits, limitations, alternatives, imponderables have been discussed, questions answered.  Because of polypharmacy, she is being done under propofol. Please see the documentation in the medical record.  PROCEDURE NOTE:  The patient was placed in left lateral decubitus position.  Propofol sedation per anesthesia.  INSTRUMENTATION:  Pentax video chip system.  FINDINGS:  Digital rectal exam revealed no abnormalities.  Endoscopic findings:  Prep was good.  Colon:  Colonic mucosa was surveyed from the rectosigmoid junction through the left transverse right colon to the appendiceal orifice, ileocecal valve/cecum.  These structures were well seen and photographed for the record.  From this level, scope slowly and cautiously withdrawn.  All previously mentioned mucosal surfaces were again seen.  The patient had extensive left-sided transverse diverticula.  There was a single diminutive polyp in the mid ascending colon which was cold biopsied/removed.  Remainder of colonic mucosa appeared normal.  Scope was pulled down to the rectum where a thorough examination of rectal mucosa including retroflexed view of  the anal verge demonstrated low excoriation of the distal rectum consistent with intermittent trauma, otherwise rectal mucosa appeared normal.  The patient tolerated the procedure well.  Cecal withdrawal time 8 minutes.  IMPRESSION: 1. Minimal rectal mucosal trauma, I feel secondary to intermittent     trauma, otherwise normal rectum. 2. Extensive left-sided transverse diverticula, diminutive polyp,     ascending colon status post cold biopsy removal.  Remainder of     colonic mucosa appeared normal.  RECOMMENDATIONS: 1. Continue regimen for constipation in the way of MiraLax and fiber     bolster diet. 2. Follow up on path. 3. Literature on polyps and diverticulosis provided to Ms. [employer]. 4. Further recommendations to follow.     Jonathon Bellows, M.D.     RMR/MEDQ  D:  05/12/2010  T:  05/12/2010  Job:  [id number]  cc:   Fransisco Hertz, M.D. Fax: [phone]  Electronically Signed by Lorrin Goodell M.D. on 05/17/2010 02:43:37 PM Electronically Signed by Lorrin Goodell M.D. on 05/17/2010 03:24:01 PM Electronically Signed by Lorrin Goodell M.D. on 05/17/2010 03:48:25 PM Electronically Signed by Lorrin Goodell M.D. on 05/17/2010 04:26:32 PM Electronically Signed by Lorrin Goodell M.D. on 05/17/2010 07:36:58 PM

## 2010-05-19 ENCOUNTER — Ambulatory Visit (INDEPENDENT_AMBULATORY_CARE_PROVIDER_SITE_OTHER): Payer: Medicare Other | Admitting: Infectious Diseases

## 2010-05-19 DIAGNOSIS — Z23 Encounter for immunization: Secondary | ICD-10-CM

## 2010-05-19 NOTE — Assessment & Plan Note (Signed)
Summary: F/U OV/VS   CC:  follow-up visit.  Preventive Screening-Counseling & Management  Alcohol-Tobacco     Alcohol drinks/day: 0     Smoking Status: quit < 6 months     Smoking Cessation Counseling: yes     Smoke Cessation Stage: contemplative     Packs/Day: 0.25     Cans of tobacco/week: no     Passive Smoke Exposure: yes  Caffeine-Diet-Exercise     Caffeine use/day: coffee     Does Patient Exercise: yes     Type of exercise: elipical  Safety-Violence-Falls     Seat Belt Use: yes   Current Allergies (reviewed today): No known allergies  Vital Signs:  Patient profile:   70 year old female Menstrual status:  hysterectomy Height:      66.25 inches (168.28 cm) Weight:      152.12 pounds (69.15 kg) BMI:     24.46 Temp:     98.1 degrees F (36.72 degrees C) oral Pulse rate:   92 / minute BP sitting:   154 / 79  (left arm)  Vitals Entered By: Baxter Hire) (December 23, 2009 9:54 AM) CC: follow-up visit Pain Assessment Patient in pain? yes     Location: head/right hip Intensity: 7 Type: aching Onset of pain  pain came on last night Nutritional Status BMI of 19 -24 = normal Nutritional Status Detail appetite is pretty good per patient  Have you ever been in a relationship where you felt threatened, hurt or afraid?No   Does patient need assistance? Functional Status Self care Ambulation Normal    Other Orders: Est. Patient Level IV (45409) Future Orders: T-Comprehensive Metabolic Panel ([id number]) ... 04/25/2010 T-CBC w/Diff ([id number]) ... 04/25/2010 T-CD4SP ([facility]) (CD4SP) ... 04/25/2010 T-HIV Viral Load [phone]) ... 04/25/2010  Patient Instructions: 1)  Please schedule a follow-up appointment in 4 months. 2)  Be sure to return for lab work one (1) week before your next appointment as scheduled. Prescriptions: ATRIPLA 600-200-300 MG TABS (EFAVIRENZ-EMTRICITAB-TENOFOVIR) Take 1 tablet by mouth once a day  #30 x 11   Entered by:    Alesia Morin CMA   Authorized by:   Talmadge Chad NP   Signed by:   Alesia Morin CMA on 04/21/2010   Method used:   Print then Give to Patient   RxID:   [id number]  Printed to give with the ADAP application Alesia Morin CMA  April 21, 2010 1:59 PM   Process Orders Check Orders Results:     [facility]: Check successful Tests Sent for requisitioning (May 10, 2010 9:57 AM):     04/25/2010: [facility] -- T-Comprehensive Metabolic Panel [[id number]] (signed)     04/25/2010: [facility] -- T-CBC w/Diff [[id number]] (signed)     04/25/2010: [facility] -- T-HIV Viral Load [phone] (signed)

## 2010-05-24 NOTE — Letter (Signed)
Summary: Patient Notice, Colon Biopsy Results  [facility]  [address]   [place], [place] [address]   Phone: [phone]  Fax: [phone]       May 17, 2010   Jessica [name] [address] [place], [place]  [address] July 11, 1940    Dear Jessica [name],  I am pleased to inform you that the biopsies taken during your recent colonoscopy did not show any evidence of cancer upon pathologic examination.  Additional information/recommendations:  No further action is needed at this time.  Please follow-up with your primary care physician for your other healthcare needs.  You should have a repeat colonoscopy examination  in 10 years.  Please call us if you are having persistent problems or have questions about your condition that have not been fully answered at this time.  Sincerely,    R. Roetta Sessions MD, FACP [facility] Ph: [phone]    Fax: [phone]   Appended Document: Patient Notice, Colon Biopsy Results Letter mailed to pt.   Appended Document: Patient Notice, Colon Biopsy Results reminder in epic

## 2010-05-30 LAB — URINE CULTURE: Colony Count: >=100000

## 2010-05-30 LAB — URINE MICROSCOPIC-ADD ON

## 2010-05-30 LAB — URINALYSIS, ROUTINE W REFLEX MICROSCOPIC
Glucose, UA: NEGATIVE mg/dL
Ketones, ur: NEGATIVE mg/dL
Nitrite: NEGATIVE
Protein, ur: NEGATIVE mg/dL
pH: 7.5 (ref 5.0–8.0)

## 2010-06-29 ENCOUNTER — Encounter: Payer: Self-pay | Admitting: *Deleted

## 2010-06-29 ENCOUNTER — Ambulatory Visit (INDEPENDENT_AMBULATORY_CARE_PROVIDER_SITE_OTHER): Payer: Medicare Other | Admitting: *Deleted

## 2010-06-29 VITALS — BP 175/87 | HR 94 | Temp 98.3°F | Resp 16 | Ht 67.0 in | Wt 144.5 lb

## 2010-06-29 DIAGNOSIS — B20 Human immunodeficiency virus [HIV] disease: Secondary | ICD-10-CM

## 2010-06-29 DIAGNOSIS — Z114 Encounter for screening for human immunodeficiency virus [HIV]: Secondary | ICD-10-CM

## 2010-06-29 DIAGNOSIS — E785 Hyperlipidemia, unspecified: Secondary | ICD-10-CM

## 2010-06-29 NOTE — Progress Notes (Signed)
Patient here for week 768 ALLRT study visit. She recently had surgery on her RT eye for glaucoma, and now she has noticed her left eye vision getting worse. She said she was told that she was at risk for losing her sight. She had a colonoscopy in March at [facility] that was unremarkable, except for a polyp. She has had problems managing her pain due to changes with her insurance coverage. The medicare part D plan she has this year would not cover the oxycontin that she has been on for years. She was switched to Fentanyl 75 mcg patches in March and tried that for 10 days but became too lethargic and sedated with it and stopped it. She said it was working well for pain relief. She has been taking vicodin every 4-6 hours prescribed by Dr. Maurice March, but concerned about the amount of acetaminophen she is consuming. I will check with Dr. Maurice March about putting her on a lower dose of fentanyl  ( ) supplemented with vicodin.

## 2010-06-30 LAB — COMPREHENSIVE METABOLIC PANEL
ALT: 16 U/L (ref 0–35)
AST: 31 U/L (ref 0–37)
Albumin: 4.7 g/dL (ref 3.5–5.2)
Alkaline Phosphatase: 81 U/L (ref 39–117)
Potassium: 3.8 mEq/L (ref 3.5–5.3)
Sodium: 140 mEq/L (ref 135–145)
Total Bilirubin: 0.3 mg/dL (ref 0.3–1.2)
Total Protein: 7.7 g/dL (ref 6.0–8.3)

## 2010-06-30 LAB — LIPID PANEL
LDL Cholesterol: 47 mg/dL (ref 0–99)
VLDL: 21 mg/dL (ref 0–40)

## 2010-06-30 LAB — CREATININE, URINE, RANDOM: Creatinine, Urine: 61.1 mg/dL

## 2010-06-30 LAB — HEPATITIS A ANTIBODY, TOTAL: Hep A Total Ab: POSITIVE — AB

## 2010-07-01 ENCOUNTER — Other Ambulatory Visit: Payer: Self-pay | Admitting: Licensed Clinical Social Worker

## 2010-07-01 ENCOUNTER — Other Ambulatory Visit: Payer: Self-pay | Admitting: Infectious Diseases

## 2010-07-01 DIAGNOSIS — R52 Pain, unspecified: Secondary | ICD-10-CM

## 2010-07-01 MED ORDER — FENTANYL 25 MCG/HR TD PT72: 1.0000 | MEDICATED_PATCH | TRANSDERMAL | Status: DC

## 2010-07-07 NOTE — Progress Notes (Signed)
Kim, 25 mg Fentenyl patch is fine. Tim

## 2010-07-14 ENCOUNTER — Encounter: Payer: Self-pay | Admitting: Infectious Diseases

## 2010-07-14 LAB — CD4/CD8 (T-HELPER/T-SUPPRESSOR CELL)
CD4: 1859
CD8 % Suppressor T Cell: 38.6
CD8: 1737

## 2010-07-26 NOTE — Procedures (Signed)
BYPASS GRAFT EVALUATION   INDICATION:  Followup aortobifemoral bypass graft.   HISTORY:  Diabetes:  No.  Cardiac:  No.  Hypertension:  Yes.  Smoking:  Yes.  Previous Surgery:  Aortobifemoral bypass graft and left common femoral  artery endarterectomy on 05/08/2007.   SINGLE LEVEL ARTERIAL EXAM                               RIGHT              LEFT  Brachial:                    155                167  Anterior tibial:             165                157  Posterior tibial:            152                144  Peroneal:  Ankle/brachial index:        0.99               0.94   PREVIOUS ABI:  Date:  09/03/2008  RIGHT:  1.06  LEFT:  1.01   LOWER EXTREMITY BYPASS GRAFT DUPLEX EXAM:   DUPLEX:  Patent aortobifemoral bypass graft with elevated velocity of  219 at the left limb outflow level. There is no evidence of restenosis.   IMPRESSION:  1. Normal bilateral ankle brachial indices.  2. Patent aortobifemoral bypass graft with no focal stenosis noted.        ___________________________________________  Quita Skye Hart [place], M.D.   CB/MEDQ  D:  04/20/2009  T:  04/21/2009  Job:  [id number]

## 2010-07-26 NOTE — Discharge Summary (Signed)
NAME:  Jessica [name], Jessica [name]       ACCOUNT NO.:  [id number]   MEDICAL RECORD NO.:  [id number]          PATIENT TYPE:  INP   LOCATION:  2023                         FACILITY:  [facility]   PHYSICIAN:  Quita Skye. Hart Rochester, M.D.  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   DISCHARGE DIAGNOSIS:  Severe aortoiliac occlusive disease with severe  claudication and ischemia of both lower extremities.   DISCHARGE SECONDARY DIAGNOSES:  1. Hypertension.  2. Hyperlipidemia.  3. Peripheral neuropathy.  4. Human immunodeficiency virus positive.  5. Negative for coronary artery disease, diabetes, chronic obstructive      pulmonary disease or stroke.  6. Abdominal hysterectomy  7. Smoking 1.5 to 1 packs of cigarettes per day for 40+ years,      currently smokes half pack a day.   PROCEDURES:  1. Aortobifemoral bypass grafting using a 14 x 8 mm Hemashield Dacron      graft done by Dr. Hart Rochester on May 08, 2007.  2. Left common femoral endarterectomy done by Dr. Hart Rochester on May 08, 2007.   CONSULTANTS:  1. Cardiology on May 14, 2007 for evaluation of tachycardia and      weakness.  2. Care management on May 13, 2007.   HISTORY/PHYSICAL:  This is a 70 year old female patient who has been HIV  positive since 1988.  States that she has been having increasing  discomfort in the lower left leg over the last year which has become  particularly bad in the last 5-6 weeks.  She described the pain as  numbness, tingling and sharp discomfort in the left foot which waxes and  wanes.  She is unable to ambulate more than several feet because of  discomfort in her leg.  She states that there is no pain in the right  side.  Occasionally, this will extend up into the hip area.   Holding her  leg in the dependent position at night is helpful.  Lower extremity  arterial Dopplers were performed at [facility] on April 25, 2007, which revealed an ABI of 0.48 on the right and 0.32 on the left.  She was referred for further evaluation.  She was found to have severe  aortoiliac occlusive disease on physical exam.  A CT angiogram was  scheduled by Dr. Hart Rochester to further evaluate this.  The plan at this time  due to severe aortoiliac and femoral-popliteal occlusive disease with  rest ischemia in the left lower extremity, an aortobifemoral bypass  grafting for relief of her rest pain symptoms would be done pending  results of CT angiography done later that week.  Date of admission for  this  surgery to be determined later.  A week later after being  evaluated initially for severe aortic occlusive disease, rest pain and  severe claudication in both legs, she underwent CT angiography which Dr.  Hart Rochester reviewed.  It revealed plaque formation in her  distal aorta with  total occlusion of her external iliac bilaterally, but it appears to be  a good runoff from the common femoral distally in both legs with no  occlusion of the major vessels.  The patient also had blood work the  same day which was unremarkable and also had chest x-ray and EKG that  was unremarkable.  She has no history of coronary artery disease and no  symptoms of coronary artery disease.  The patient was scheduled for a  aortobifemoral bypass grafting on May 08, 2007 at [facility].  Risks and benefits have been thoroughly discussed and she  would like to proceed with this.  The patient does have absent femoral  pulses with under perfused lower extremities bilaterally.  The plan is  to proceed with her surgical procedure on May 08, 2007 by Dr.  Hart Rochester.   HOSPITAL COURSE:  The patient was admitted to [facility] on  May 08, 2007 due to severe aortic iliac occlusive  disease with rest  pain and severe claudication in both legs.  The patient was taken to the  OR today after signing consent and having risks and benefits explained  to her to proceed with the surgical procedure of an aortobifemoral  bypass grafting and femoral endarterectomy.  The patient remained stable  throughout the procedure with no complications and was transferred to  the PACU where she continued to remain stable for several hours.  The  patient then was later transferred to floor 3300  where she continued to  remain stable throughout the night.  Postop day number 1, the patient  continued to remain stable.  The patient's calcium was at 2.9.  The  patient was started on Lopressor 3 times during the night to manage  blood pressure.  Currently blood pressure was at 145/70.  The patient  also was having increased pain.  We continued the PCA pumps for pain.  The patient did have 2-3+ dorsalis pedis pulses.  Abdomen was soft and  nontender.  NG tube was putting out less than 200 cc and felt that it  was okay for the patient to be transferred to floor 2000 at this time  and to continue to keep n.p.o., but discontinue the NG tube.  The  patient continued to remain stable throughout.  On postop day 3, the  patient was feeling better with no complaints.  Abdominal as stated was  slightly tight, but no changes from the previous day.  The patient had a  bowel movement last night.  The patient had been tolerating ice chips.  The patient was alert and oriented in bed.  Abdominal, active bowel  sounds, tender, mildly distended.  Feet bilaterally were warm and well  perfused.  Lungs were clear on physical exam.  Assessment and plan was  to go ahead and discontinue the oxygen to room air, increase ambulation  and if continuing to tolerate the ambulation, could discontinue the  Foley cath.  The patient was also to start clear liquids slowly at  lunchtime and to intake as much as could tolerate.  The  patient's  hemoglobin at this time was 8.7, but would rechecked it in the a.m.  If  trending down less than 8, possibly transfuse a unit of packed red blood  cells, but felt at this time there was probably a lab error.  Also once  the patient was taking sips well of liquids and passing gas, then the  patient would  need to start home medications to continue managing her  HIV.  Postop day number 4, the patient continued to remain stable.  Her  hemoglobin had trended upward to 10.6 from the previous day, so felt  that the change in the hemoglobin was possibly due to a lab error.  The  patient's potassium was at 3.0 currently.  The patient stated the  abdomen was a little bit sore, but she was walking and continued to have  bowel movements, and just sipping slowly on clear liquids.  The patient  also had Foley discontinued and had urinated.  The patient was passing  gas.  On physical examination, the abdomen had active bowel sounds,  tender, mildly distended.  Incisions looked good and the feet were warm  bilaterally.  Assessment at this time was to continue to increase  ambulation.  Continue a clear liquid diet and slowly sipping, resume  p.o. meds from home once advanced to full diet.  Continue to control  pain.   Hypokalemic, I went ahead and gave potassium replacement.  The patient  continued to progress and tolerating clear liquids slowly.  I went ahead  and continued to progress on diet to more solid foods on postop day  number 6.  The patient was given again Lopressor for increased heart  rate.  She having some runs of sinus tachycardia in the 120s.  I did  consult cardiology and got another EKG at this time.  Cardiology felt  that it was asymptomatic tachycardia and questioned etiology.  Also felt  like it might possibly be due to postop dehydration.  At this time,  cardiac felt to continue fluids and continue pain management.  The  patient also had a D-dimer, followed troponin and  got an echo.  D-dimer  was elevated, but it was questionable if it was related to postop status  with limited ambulation.  Cardiology at this time felt to go ahead and  do a lower leg left ultrasound to rule out DVT.  The patient is also  asked to have a CT of chest to continue to rule out PE.  The patient  according to cardiology had declined the CT and also the left leg  ultrasound due to she felt she had just been through too much at this  time.  The patient also did state to Dr. Edilia Bo that she had a CT of  the chest and venous duplex of the lower left extremity and they were  both negative, but Dr. Edilia Bo cannot find any evidence of this on the  computer.  The patient continued from our standpoint to remain stable  and vital signs on postop day number 8, blood pressure was still 161/72,  heart rate was in the 80s and temperature was at 98.2.  The patient's  potassium is still at 2.9, so continue to replace potassium.  The  patient also stated that she had panic attacks the previous day.  The  patient's abdomen was soft and nontender.  Incisions looked fine.  The  patient was tolerating a soft diet.  It is felt at this time to just  continue to manage potassium.  We will continue to let the tachycardia  be managed by cardiology.  Plan was for the patient to discharged to  home soon.   On May 16, 2007, there was later that afternoon a record of left leg  venous duplex completed with no evidence of DVT, SVT or Baker's cyst.  On May 17, 2007 postop day number 9, the patient continued to be  comfortable, no chest pain and no shortness of breath.  The patient was  afebrile.  Temperature of 98, blood pressure 158/80 and heart rate in  the 80s.  On physical examination, lungs were clear.  Abdomen was soft  and tender with active bowel sounds.  Feet were warm.  The abdominal  incision looked okay.  Potassium now is at 3.3.  Assessment and plan at  this time due to venous duplex being  negative and the patient does not  want CT scan of chest, at this moment the patient was having no  pleuritic chest pain and tachycardia had resolved, it is felt that it  was okay to agree with the patient's desire.  The patient's hypokalemia  continued to be resolved by supplemental potassium.  We gave the patient  20 more mEq of potassium before patient is discharged to home today.  The patient agrees with this plan and is ready to discharge to home  today.   DISCHARGE MEDICATIONS:  1. Acyclovir 400 mg 3 times daily.  2. Lovastatin 40 mg daily  3. Hydrocodone 7.5-750 mg every 6 hours as needed.  4. Morphine sulfate 30 mg every 12 hours.  5. Gabapentin 300 mg 3 times daily.  6. Atripla tablets 1 daily.  7. Benazepril/hydrochlorothiazide 20/12.5 mg 2 tablets daily.  8. Cyclobenzaprine 10 mg 1 tablet every 8 hours for back pain.  9. Alprazolam 1 mg 1 tablet every 8 hours as needed for anxiety.  10.Loratadine over-the-counter 20 mg every 12 hours.  11.Eye drops including Alphagan 0.15% 1 drop for each eye twice daily.  12.Systane eye drops solution for dry eyes as needed.  13.Mature Complete multivitamin daily.  14.Mucinex 2 tablets every 12 hours as needed.  15.P__________ tablets 2 tablets every 6 hours as needed.  16.P__________ HBP 2 tablets every 6 hours as needed.   DISCHARGE INSTRUCTIONS:  The patient is indicated for discharge today on  May 17, 2007.  I have discussed this with the patient.  The patient  agrees to discharge because the patient is currently stable and not  having any pain.  The patient agrees that she understands these  instructions and will discharge today.  The patient is to follow a  diabetic diet.  The patient is to continue to clean wound gently with  soap and water.  The patient is to increase activity slowly and walk  with assistance.  The patient will be discharged with a walker to help  with this and a bedside commode for convenience.  The patient is  to  shower and bathe.  The patient is to do no lifting or driving for the  next 3 weeks.  The patient to contact the office if increased redness,  drainage, especially pus, swelling from the incision.  The patient also  is to contact the office if she has severe abdominal pain or numbness in  legs.  The patient is to return to Dr. Hart Rochester in 3 weeks to follow up  with ABIs.  The patient also is to return to office on Tuesday, May 21, 2007 to have staples removed.  The patient is to continue home  medications per reconciliation sheet.  The  patient is also to take Tylox 1-2 tablets every 4 hours as needed for  pain.  This has been given to the patient.  Again, the patient states  that she understands these instructions and she is ready  to discharge to  home today on May 17, 2007.  The patient is currently stable.      Cyndy Freeze, PA      Quita Skye. Hart Rochester, M.D.  Electronically Signed    ALW/MEDQ  D:  05/17/2007  T:  05/18/2007  Job:  [id number]

## 2010-07-26 NOTE — Assessment & Plan Note (Signed)
OFFICE VISIT   CALEE, NUGENT  DOB:  1940/12/25                                       06/04/2007  ZOXWR#:[id number]   HISTORY:  The patient is 4 weeks status post aortobifemoral bypass  grafting and left common femoral endarterectomy for severe aorto-  vascular occlusive disease.  Her claudication symptoms are relieved.  She continues to have neuropathy symptoms in both feet which has been  followed by Dr. Ninetta Lights.  She has a good appetite and her oral intake is  improving as is her general strength.   PHYSICAL EXAMINATION:  Blood pressure is 152/82, heart rate is 98,  respirations are 16.  Her abdominal incision is well-healed with no  evidence of ventral hernia.  She has excellent femoral and dorsalis  pedis pulses bilaterally.  All wounds look as if they are healing  nicely.   She is reassured regarding these findings.  Return to see me in 2 months  for final followup.   Quita Skye Hart Rochester, M.D.  Electronically Signed   JDL/MEDQ  D:  06/04/2007  T:  06/05/2007  Job:  [id number]   cc:   Fransisco Hertz, M.D.  Lacretia Leigh. Ninetta Lights, M.D.

## 2010-07-26 NOTE — Op Note (Signed)
NAME:  Jessica [name], Jessica [name]       ACCOUNT NO.:  [id number]   MEDICAL RECORD NO.:  [id number]          PATIENT TYPE:  INP   LOCATION:  2316                         FACILITY:  [facility]   PHYSICIAN:  Quita Skye. Hart Rochester, M.D.  DATE OF BIRTH:  1940-12-21   DATE OF PROCEDURE:  05/08/2007  DATE OF DISCHARGE:                               OPERATIVE REPORT   PREOPERATIVE DIAGNOSIS:  Severe aortoiliac occlusive disease with severe  claudication and ischemia, both lower extremities.   POSTOPERATIVE DIAGNOSIS:  Severe aortoiliac occlusive disease with  severe claudication and ischemia, both lower extremities.   OPERATION:  1. Aortobifemoral bypass grafting using a 14 x 8 mm Hemashield Dacron      graft.  2. Left common femoral endarterectomy.   SURGEON:  Quita Skye. Hart Rochester, M.D.   FIRST ASSISTANT:  Jerold Coombe, P.A.   ANESTHESIA:  General endotracheal.   PROCEDURE:  The patient was taken to the operating room, placed in the  supine position at which time satisfactory general endotracheal  anesthesia was administered.  A radial arterial line and Swan-Ganz  catheter inserted by anesthesia.  The abdomen and groins were prepped  with Betadine scrub and solution and draped in routine sterile manner.  Short longitudinal incisions were made in both inguinal regions.  The  common, superficial, and profunda femoris arteries were dissected free,  encircled with vessel loops.  There was no palpable pulse in either  common femoral artery.  The left was slightly more thickened than the  right.  Midline incision was made from xiphoid to below the umbilicus,  carried down through subcutaneous tissue and linea alba using Bovie.  Peritoneal cavity was entered and thoroughly explored.  A few adhesions  were taken down in the pelvis from the omentum from previous  hysterectomy.  Stomach, duodenum, small bowel and colon were  unremarkable.  Liver was smooth with no palpable masses.  Gallbladder  had been  removed.  Transverse colon was elevated and intestines  reflected to the right side.  Retroperitoneum incised exposing the aorta  from the renal arteries to the bifurcation.  Aorta was encircled  circumferentially just below the renal arteries where it had an  excellent pulse.  Retroperitoneal tunnels were created posterior to the  ureters.  The patient was then given 25 grams of mannitol and  heparinized.  Aorta was occluded distal the renal arteries, transected  and the distal stump oversewn with two layers 3-0 Prolene.  Proximal end  did not require an endarterectomy and was very smooth on the intimal  surface.  A 14 x 8 mm Hemashield Dacron graft anastomosed end-to-end  with 3-0 Prolene buttressing this with a strip of felt.  This was  checked for leaks, none were present.  The limbs were delivered through  the retroperitoneal tunnels.  End-to-side anastomoses were done to the  common femoral arteries bilaterally.  On the right side, the vessel was  very smooth and the superficial femoral artery was patent.  On the left  side, there was an ulcerative plaque and there was laminated thrombus  beneath this plaque extending down to the origin of the superficial  femoral  artery and this required a superficial endarterectomy on the  left side and tacking down the intima posteriorly with some 6-0 Prolene  sutures.  Arteriotomy was extended down the superficial femoral artery  about 2 cm.  Following completion of the left femoral anastomosis,  appropriate flushing, clamps released.  There was an excellent pulse in  both femoral arteries.  Adequate hemostasis was achieved.  Groin wounds were closed in layers  with Vicryl in a subcuticular fashion.  The retroperitoneum was  reapproximated with 3-0 Vicryl, linea alba closed with #1 Prolene and  the skin with clips.  Sterile dressing applied.  The patient taken to  recovery room in satisfactory condition.      Quita Skye Hart Rochester, M.D.   Electronically Signed     JDL/MEDQ  D:  05/08/2007  T:  05/09/2007  Job:  [id number]

## 2010-07-26 NOTE — Consult Note (Signed)
NAME:  Jessica [name], Jessica [name]       ACCOUNT NO.:  [id number]   MEDICAL RECORD NO.:  [id number]          PATIENT TYPE:  INP   LOCATION:  2023                         FACILITY:  [facility]   PHYSICIAN:  Bruce R. Juanda Chance, MD, FACCDATE OF BIRTH:  October 21, 1940   DATE OF CONSULTATION:  05/14/2007  DATE OF DISCHARGE:                                 CONSULTATION   REASON FOR REFERRAL:  Evaluation of tachycardia.   CHIEF COMPLAINT:  Weakness.   CLINICAL HISTORY:  Jessica [name] is 70 years old and was admitted on  May 08, 2007 for aortobifemoral bypass grafting by Dr. Hart Rochester.  She  also had a left femoral endarterectomy for severe aortoiliac disease.  Her postoperative course has been characterized by fever and excisional  pain, and she has also run of persistent mild sinus tachycardia of 100  and slightly over 100.  She has not complained of any chest pain or  shortness of breath.  She has no prior history of cardiac disease except  for a long history of a heart murmur.   PAST MEDICAL HISTORY:  1. Hypertension.  2. Hyperlipidemia.  3. Peripheral neuropathy.  4. Peripheral vascular disease.  5. She also is HIV positive.   PAST SURGICAL HISTORY:  1. She had previous laparoscopic cholecystectomy.  2. Previous hysterectomy.   MEDICINES IN THE HOSPITAL:  1. Metoprolol IV.  2. Pepcid IV.  3. Hydralazine IV.  4. Labetalol IV.  5. Tylenol.  6. Zofran.   SOCIAL HISTORY:  She lives in [place].  She is widowed and has 4 children.  She smoked for 25 years.  She does not drink.   FAMILY HISTORY:  Positive for diabetes in both her mother and father.  Her father also had a stroke.   REVIEW OF SYSTEMS:  Positive for a leg pains and some abdominal pain and  incisional pain.  The review of systems is otherwise negative.   PHYSICAL EXAMINATION:  VITAL SIGNS:  Blood pressure is 186/95 and pulse  105 and regular.  The temperature was 100.3.  NECK:  Venous pulsation was visible 2 cm above the  clavicle.  The  carotid pulses were full without bruits.  CHEST:  Clear without rales or rhonchi.  HEART:  The cardiac rhythm was regular.  The heart sounds were normal.  There is a grade 2/6 short systolic murmur at the left sternal edge.  There is a questionable early diastolic murmur.  ABDOMEN:  Soft.  Bowel sounds were normal.  There was a recent incision  from her surgery.  EXTREMITIES:  The pedal pulses were difficult to feel.  There was no  peripheral edema.  MUSCULOSKELETAL:  No deformities.  SKIN:  Warm and dry.  NEUROLOGIC:  No focal neurological signs.   LABORATORY DATA:  Her BUN/creatinine was 1/0.5.  Potassium 3.5.   IMPRESSION:  1. Sinus tachycardia, probably multifactorial.  2. Status post recent aortobifemoral bypass graft surgery.  3. Hypertension.  4. Hyperlipidemia.  5. Peripheral neuropathy.  6. Peripheral vascular disease.   RECOMMENDATIONS:  I think Ms. Wilkerson's sinus tachycardia may be  explained by her low-grade fever and by incisional pain.  Will plan  to  evaluate her further for other causes.  Her hemoglobin is 9.8.  Will get  a TSH.  Will get a D-dimer, which, if it is negative, will be helpful,  and if it is positive, will have to decide if we need to evaluate her  further for pulmonary embolism.  There is nothing else to suggest that  except for the setting.  Her recent cardiac markers ordered by Dr.  Candie Chroman team are negative.  Will get a urinalysis to evaluate her  fever.  Will ask the surgeon if they think she should be on prophylactic  Lovenox for prevention of a DVT and pulmonary embolism.      Bruce Elvera Lennox Juanda Chance, MD, [facility]  Electronically Signed     BRB/MEDQ  D:  05/14/2007  T:  05/15/2007  Job:  [id number]   cc:   Quita Skye. Hart Rochester, M.D.

## 2010-07-26 NOTE — Procedures (Signed)
BYPASS GRAFT EVALUATION   INDICATION:  Followup aortobifem bypass graft.   HISTORY:  Diabetes:  No.  Cardiac:  No.  Hypertension:  Yes.  Smoking:  Yes.  Previous Surgery:  Aortobifem bypass graft and left common femoral  artery endarterectomy on 05/08/2007.   SINGLE LEVEL ARTERIAL EXAM                               RIGHT              LEFT  Brachial:                    171                167  Anterior tibial:             182                173  Posterior tibial:            166                157  Peroneal:  Ankle/brachial index:        1.06               1.01   PREVIOUS ABI:  Date:  10/23/2007  RIGHT:  1.08  LEFT:  1.06   LOWER EXTREMITY BYPASS GRAFT DUPLEX EXAM:   DUPLEX:  Biphasic Doppler waveforms noted throughout the aortobifem  bypass graft and its native vessels with an increased velocity of 273  cm/second noted in the left common femoral artery.   IMPRESSION:  1. Patent aortobifem bypass graft with an increased velocity noted, as      described above.  2. Stable bilateral ankle brachial indices noted.       ___________________________________________  Quita Skye Hart Rochester, M.D.   CH/MEDQ  D:  05/06/2008  T:  05/06/2008  Job:  [id number]

## 2010-07-26 NOTE — H&P (Signed)
HISTORY AND PHYSICAL EXAMINATION   April 30, 2007   Re:  Jessica [name], Jessica [name]             DOB:  June 29, 1940   This is a history and physical/vascular surgery consultation.   Date of admission is unknown at this point.   CHIEF COMPLAINT:  Rest pain, left foot, secondary to aortoiliac, femoral  popliteal, and tibial occlusive disease.   HISTORY OF PRESENT ILLNESS:  This 69 year old female patient who has  been HIV positive since 1988 states that she has been having increasing  discomfort in the left leg over the last year, which has become  particularly bad in the last 5-6 weeks.  She describes pain, numbness,  tingling, and sharp discomfort in the left foot, which waxes and wanes.  She is unable to ambulate more than several feet because of discomfort  in her leg.  She states that there is no pain in the right side.  Occasionally this will extend up into the hip area.  Holding her leg in  the dependent position at night is helpful.  Lower extremity arterial  Dopplers were performed at [facility] on February 12th, which  revealed an ABI of 0.48 on the right and 0.32 on the left.  She was  referred for further evaluation.  She was found to have severe  aortoiliac occlusive disease on physical exam and a CT angiogram was  scheduled by Dr. Hart Rochester to further evaluate this.   PAST MEDICAL HISTORY:  1. Hypertension.  2. Hyperlipidemia.  3. Peripheral neuropathy.  4. HIV positive.  5. Negative for coronary artery disease, diabetes, COPD, or stroke.   PAST SURGICAL HISTORY:  Abdominal hysterectomy.   FAMILY HISTORY:  Positive for diabetes in her mother and father.  Positive for stroke in her father.  Negative for coronary artery  disease.   SOCIAL HISTORY:  She is widowed.  Has four children.  Is disabled.  She  has smoked between one-half and one pack of cigarettes per day for 40+  years.  Is currently smoking a half pack, trying to cut back.  Does  not  use alcohol.   REVIEW OF SYSTEMS:  Denies any chest pain, dyspnea on exertion, PND, or  orthopnea.  Does have a heart murmur by history and does have  constipation on a regular basis.  Has lower extremity discomfort, as  described above.  Describes dizziness and headaches, arthritis, joint  pain, rash, and nervousness.  She is scheduled for laser eye surgery in  March of this year.   ALLERGIES:  None known.   MEDICATIONS:  1. Atripla tablets 1 daily.  2. Acyclovir 400 mg 1 tablet 3 times daily for 7 days as needed.  3. Benazepril/hydrochlorothiazide 20/12.5 mg 2 tablets daily.  4. Lovastatin 40 mg 1 tablet daily.  5. Cyclobenzaprine 10 mg 1 tablet every 8 hours for back pain.  6. Alprazolam 1 mg 1 tablet every 8 hours as needed for anxiety.  7. Hydrocodone/APAP 7.5/750 mg 1 tablet q.6h. for pain.  8. Morphine sulfate 30 mg 1 tablet every 12 hours.  9. Gabapentin 300 mg 1, then 2, then 3 tablets daily 1 tablet every 8      hours.  10.Loratadine (OTC) 20 mg every 12 hours.  11.Eye drops include Alphagan 0.15% 1 drop for each eye twice daily.  12.Systane eye drops solution for dry eyes as needed.   PHYSICAL EXAMINATION:  Blood pressure 132/68, heart rate 84,  respirations 18.  General:  She is a female patient who is in no  apparent distress.  Alert and oriented x3.  Neck is supple.  Carotid  pulses are 3+ and palpable.  No bruits are audible.  Neurologic:  Unremarkable with the exception of decreased sensation in her feet.  No  palpable adenopathy in the neck.  Chest:  Clear to auscultation.  Cardiovascular:  Regular rhythm with no murmurs.  Upper extremity pulses  are 3+ bilaterally.  Abdomen is soft with no masses.  She has absent  femoral and distal pulses bilaterally.  Both feet have a chronic  ischemic appearance.  Both feet have decreased sensation.  There is no  ulceration, infection, or calf pain noted bilaterally.   IMPRESSION:  1. 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  2. Human immunodeficiency virus positive status.  3. Hypertension.  4. Hyperlipidemia.   PLAN:  Aortobifemoral bypass grafting for relief of her rest pain  symptoms, pending results of CT angiography to be done this week.  Date  of admission to be determined.   Quita Skye Hart Rochester, M.D.  Electronically Signed   JDL/MEDQ  D:  04/30/2007  T:  05/01/2007  Job:  [id number]   cc:   Lacretia Leigh. Ninetta Lights, M.D.

## 2010-07-26 NOTE — Assessment & Plan Note (Signed)
OFFICE VISIT   GREGORY, DOWE  DOB:  05/11/1940                                       05/07/2007  EAVWU#:[id number]   The patient was evaluated last week by me for severe aortoiliac  occlusive disease and rest pain, and severe claudication in both legs.  She underwent CT angiography, which I have reviewed and it revealed  plaque formation in her distal aorta with total occlusion of her  external iliac bilaterally, but what appears to be good runoff from the  common femoral distally in both legs with no occlusions in major  vessels.  She had blood work today, which is unremarkable and also had  chest x-ray and EKG.  She has no history of coronary artery disease and  no symptoms of coronary artery disease.  She was scheduled for  aortobifemoral bypass grafting tomorrow at [facility].  Risks and  benefits have been thoroughly discussed, and she would like to proceed.  Blood pressure today is 119./77, heart rate is 93, respirations 12.  She  has absent femoral pulses with underperfused lower extremities  bilaterally.  Will plan to proceed with her surgical procedure tomorrow.   Quita Skye Hart Rochester, M.D.  Electronically Signed   JDL/MEDQ  D:  05/07/2007  T:  05/08/2007  Job:  [id number]   cc:   Fransisco Hertz, M.D.  Lacretia Leigh. Ninetta Lights, M.D.

## 2010-07-26 NOTE — Assessment & Plan Note (Signed)
OFFICE VISIT   Jessica [name], Jessica [name]  DOB:  October 22, 1940                                       08/13/2007  ZHYQM#:[id number]   This is an office note.  The patient returns now 3 months post  aortobifemoral bypass grafting of left femoral endarterectomy for severe  claudication symptoms in both lower extremities secondary to severe  aortoiliac occlusive disease.  She has done very well from a vascular  standpoint with nice healing of all her incisions and excellent pulses.  She does not have claudication symptoms at this point but continues to  have symptoms consistent with peripheral neuropathy which was evaluated  previously by Dr. Ninetta Lights.  She was beginning to resume her normal diet  and activity level as much as possible.   PHYSICAL EXAMINATION:  Vital signs:  Blood pressure 148/78, heart rate  84, respirations 14.  Abdominal:  Incision is well-healed.  No evidence  of ventral hernia.  Extremities:  She has excellent femoral, popliteal  and dorsalis pedis pulses bilaterally at 3+ with well-perfused lower  extremities.   ABIs were checked today and are greater than 1.0 bilaterally with  triphasic flow which is totally normal.   I am very pleased with her vascular result and I encouraged her to  return to Dr. Maurice March and possibly Dr. Ninetta Lights for the peripheral  neuropathy.  I will be happy to see her in the future again as  necessary.   Quita Skye Hart Rochester, M.D.  Electronically Signed   JDL/MEDQ  D:  08/13/2007  T:  08/14/2007  Job:  [id number]   cc:   Fransisco Hertz, M.D.  Lacretia Leigh. Ninetta Lights, M.D.

## 2010-07-26 NOTE — Procedures (Signed)
AORTA-ILIAC DUPLEX EVALUATION   INDICATION:  Followup, aortobifemoral bypass graft.   HISTORY:  Diabetes:  No.  Cardiac:  No.  Hypertension:  Yes.  Smoking:  Yes.  Previous Surgery:  Please see above.               SINGLE LEVEL ARTERIAL EXAM                              RIGHT                  LEFT  Brachial:                  184                    181  Anterior tibial:           199                    195  Posterior tibial:          189                    175  Peroneal:  Ankle/brachial index:      1.08                   1.06  Previous ABI/date:         08/13/07, 1.10         08/13/07, 1.13   AORTA-ILIAC DUPLEX EXAM  Aorta - Proximal     61 Cm/s  Aorta - Mid          80 cm/s  Aorta - Distal       80 cm/s   RIGHT                                   LEFT  48 cm/s           CIA-PROXIMAL          102 cm/s  58 cm/s           CIA-DISTAL            77 cm/s                    HYPOGASTRIC  77 cm/s           EIA-PROXIMAL          63 cm/s  70 cm/s           EIA-MID               60 cm/s  70 cm/s           EIA-DISTAL            63 cm/s   IMPRESSION:  1. Patent aortobifemoral bypass graft with no evidence of focal      stenosis.  2. Normal ankle brachial index with biphasic Doppler waveform noted in      bilateral legs.  3. Status post aortobifemoral bypass graft.   ___________________________________________  Quita Skye. Hart Rochester, M.D.   MG/MEDQ  D:  10/23/2007  T:  10/23/2007  Job:  [id number]

## 2010-07-29 NOTE — Group Therapy Note (Signed)
NAME:  Jessica [name], Jessica [name]             ACCOUNT NO.:  [id number]   MEDICAL RECORD NO.:  [id number]          PATIENT TYPE:  WOC   LOCATION:  [facility]                   FACILITY:  [facility]   PHYSICIAN:  Ellis Parents, MD    DATE OF BIRTH:  May 13, 1940   DATE OF SERVICE:  02/25/2004                                    CLINIC NOTE   REASON FOR VISIT:  This 70 year old HIV status post hysterectomy patient  returns for a Pap smear, although she has been slightly negligent in that  her last Pap smear was in May 2004 and it should be repeated at 90-month  intervals.  The patient also states for approximately the past few months  she has had difficulty in initiating a strong urinary stream.  She sometimes  has to sit on the commode for a few minutes before the urine will come out  of the urethra.  She denies dysuria or stress incontinence.  The patient is  on a long list of drugs and has recently had some new medications added.   PHYSICAL EXAMINATION:  The vaginal apex is well supported and appears  grossly normal.  The urethra is very minimally indurated.  A Pap smear is  taken.   The patient is advised to return in 6 months for a Pap smear.  She is being  referred to a urologist for evaluation of this voiding dysfunction.      SA/MEDQ  D:  02/25/2004  T:  02/26/2004  Job:  [id number]

## 2010-07-29 NOTE — Discharge Summary (Signed)
[place]  Patient:    Jessica [name], Jessica [name]                      MRN: [id number] Adm. Date:  [id number] Disc. Date: [id number] Attending:  Farley Ly Dictator:   Raynelle Jan CC:         Fransisco Hertz, M.D.             Farley Ly, M.D.             Enos Fling. Ezzard Standing, M.D.                           Discharge Summary  DATE OF BIRTH: [birth year]  PRIMARY CARE PHYSICIAN:  Fransisco Hertz, M.D.  CONSULTANTS:  Sandria Bales. Ezzard Standing, M.D. (surgery).  PROCEDURES:  1. Right upper quadrant ultrasound, which revealed a 12 mm gallstone.  2. Hepatobiliary scan, which is negative.  3. V/Q scan, which was negative.  4. CT of the chest, which showed bilateral lower lobe atelectasis versus     pneumonia.  DISCHARGE DIAGNOSES:  1. Right-sided pneumonia.  2. Hypertension.  3. Human immunodeficiency virus disease.  4. Degenerative joint disease.  DISCHARGE MEDICATIONS:  1. Levaquin 500 mg p.o. q.d. to complete a ten day course.  2. Reserpine 0.25 mg p.o. q.d.  3. Lotensin 20 mg p.o. q.d.  4. Hydrochlorothiazide 25 mg p.o. q.d.  5. DDI 400 mg p.o. q.d.  6. 3TC 150 mg p.o. b.i.d.  7. Sustiva 600 mg p.o. q.h.s.  FOLLOW-UP:  She is to follow up with Dr. Maurice March in the outpatient clinic on Thursday, June 09, 1999, at 11:30 a.m.  She is also to follow up with Dr. Ezzard Standing at her discretion for an elective cholecystectomy.  HISTORY OF PRESENT ILLNESS: This is a 70 year old black female with a history of HIV disease x 12 years, which has been well controlled, with a CD4 count of 868 and a viral load of less than 35, who presented with a three to four day history of dyspnea and pleuritic chest pain, abrupt in onset and right-sided in nature.  She denied any chills but did have a slight cough and noted that the pain was worse with inspiration and exertion.  She denied any recent immobilization and had been taking her HIV  medicines as prescribed.  PHYSICAL EXAMINATION:  VITAL SIGNS:  In the ED she was found to have a temperature of 99.1 degrees, a blood pressure of 197/102, her respiratory rate was between 20 and 24, her pulse was between 110 and 120.  Oxygen saturation was 89 to 90% on room air.  GENERAL:  In general she was dyspneic and somewhat anxious.  LUNGS:  Clear without crackles or wheezes; however, she had slightly decreased breath sounds in the right middle lobe and right upper lobe.  CARDIOVASCULAR:  She was tachycardic with a normal S1 and S2, regular rate and rhythm, no murmur.  ABDOMEN:  Soft with positive bowel sounds.  EXTREMITIES:  Calf nontender with no palpable cords or masses.  NEUROLOGIC:  Normal strength and normal gait.  ADMISSION LABORATORY DATA: ABG showed a pH of 7.443, pCO2 38.8, pO2 53.5, bicarbonate 27, oxygen saturation 89%; this was on room air.  Her admission CBC included a WBC of 8.6 with hemoglobin 12.4 and hematocrit 35.5; platelets 280,000;  ANC 3.9; ALC 4.0.  Admission basic metabolic panel was within normal limits.  Admission LFTs showed a total bilirubin of 0.5, alkaline phosphatase 65, SGOT 22, SGPT 13.  Amylase was obtained and was 131.  Serial CK and CK-MBs were negative as well as troponin.  Chest x-ray in the ED was performed showing subsegmental atelectasis in the right middle lobe.  V/Q scan was negative.  Chest CT showed bilateral lower lobe atelectasis versus pneumonia, but showed no evidence of PE.  HOSPITAL COURSE: She was admitted to [facility] secondary to dyspnea and right pleuritic pain as well as hypoxia.  Her clinical picture was felt to be most consistent with pneumonia and therefore she was started on Levaquin 500 mg IV q.d. for outpatient community acquired pneumonia coverage.  On the day following admission on examination she was felt to have a significant amount of right upper quadrant pain during examination and therefore  abdominal processes of her right-sided pain were explored including a right upper quadrant ultrasound obtained showing a 12 mm gallstone.  General surgery was consulted at that time and felt the patient did not have acute cholecystitis but recommended a hepatobiliary scan, which was negative.  She will need at some point to follow up with surgery for an elective cholecystectomy; however, they did not recommend surgery during her hospitalization.  Over the several days preceding her discharge the patient began to improve on antibiotics and was able to be switched to p.o. Levaquin.  On the day of discharge her pain had much improved and she was no longer dyspneic or short of breath.  She was ambulating in the halls and admitted to only a slight cough.  She has been afebrile for greater than 24 hours and her vital signs have been stable. Oxygen saturation was 98% on room air.  Her lung examination had improved to just slightly decreased breath sounds in the bases; otherwise clear to auscultation.  Her abdomen was soft and nontender and nondistended, with positive bowel sounds and no rebound or guarding.  Therefore she was discharged with a diagnosis of early pneumonia and will complete a ten day course of Levaquin as an outpatient. DD:  06/06/99 TD:  06/07/99 Job: [id number]

## 2010-07-29 NOTE — Op Note (Signed)
NAME:  Jessica [name], Jessica [name]             ACCOUNT NO.:  [id number]   MEDICAL RECORD NO.:  [id number]          PATIENT TYPE:  AMB   LOCATION:  [facility]                         FACILITY:  [facility]   PHYSICIAN:  Lindaann Slough, M.D.  DATE OF BIRTH:  1940/07/14   DATE OF PROCEDURE:  03/11/2004  DATE OF DISCHARGE:                                 OPERATIVE REPORT   PREOPERATIVE DIAGNOSES:  Meatal stenosis.   POSTOPERATIVE DIAGNOSES:  Meatal stenosis and cystitis cystica.   PROCEDURE:  Cystoscopy, urethral dilation, bladder biopsy.   SURGEON:  Lindaann Slough, M.D.   ANESTHESIA:  General.   INDICATIONS FOR PROCEDURE:  The patient is a 70 year old female who has been  complaining of frequency, dysuria, decreased force of the urinary stream,  nocturia x2 for the past year.  Her symptoms have been getting worse in the  past several weeks. Renal ultrasound shows normal kidneys.  She is scheduled  for cystoscopy and meatal dilation.   Under general anesthesia, the patient was prepped and draped and placed in  the dorsal lithotomy position.  A #22 Wapp[place] cystoscope could not be passed  in the bladder because of meatal stenosis.  The urethra was then dilated  with a #32 [place] then the cystoscope was passed in the bladder.  There are  some brownish lesions at the base of the bladder suggestive of cystitis  cystica.  There is no stone in the bladder. The ureteral orifices are in  normal position and shape with clear efflux.  Then a biopsy of the brownish  lesions was done with the cold cup biopsy forceps.  Then the areas of biopsy  were fulgurated with the Bugbee electrode.  There is no evidence of bleeding  at the end of the procedure.  The bladder was then emptied and the  cystoscope removed.   The patient tolerated the procedure well and left the OR in satisfactory  condition to post anesthesia care unit.      MN/MEDQ  D:  03/11/2004  T:  03/11/2004  Job:  [id number]   cc:   Phillips Odor, M.D.  [facility]

## 2010-07-29 NOTE — Op Note (Signed)
   NAME:  Jessica [name], Jessica [name]                       ACCOUNT NO.:  [id number]   MEDICAL RECORD NO.:  [id number]                   PATIENT TYPE:  AMB   LOCATION:  [facility]O                                 FACILITY:  [facility]   PHYSICIAN:  James L. Malon Kindle., M.D.          DATE OF BIRTH:  March 25, 1940   DATE OF PROCEDURE:  01/02/2002  DATE OF DISCHARGE:                                 OPERATIVE REPORT   PROCEDURE PERFORMED:  Colonoscopy.   ENDOSCOPIST:  Llana Aliment. Edwards, M.D.   MEDICATIONS:  Fentanyl 100 mcg, Versed 10 mg IV.   INSTRUMENT USED:  Pediatric Olympus video colonoscope.   INDICATIONS FOR PROCEDURE:  Rectal bleeding.   DESCRIPTION OF PROCEDURE:  The procedure had been explained to the patient  and consent obtained.  With the patient in the left lateral decubitus  position, the pediatric Olympus video colonoscope was inserted and advanced  under direct visualization.  The prep was quite good.  The patient did have  some mild diverticular disease.  It was certainly not very significant.  We  were able to easily pass.  The prep was excellent.  Using abdominal pressure  and position changes, we were able to advance to the right colon and then  down to the cecum.  The ileocecal valve and appendiceal orifice were seen.  The scope was withdrawn and the cecum, ascending colon, hepatic flexure,  transverse colon, splenic flexure, descending and sigmoid colon were seen  well.  No polyps were seen.  There was moderate diverticulosis.  The scope  was withdrawn down to the rectum.  The rectum was free of polyps.  There  were moderate internal hemorrhoids seen in the anal canal.  The scope was  withdrawn.  The patient tolerated the procedure well, maintained on low-flow  oxygen and pulse oximeter throughout the procedure.   ASSESSMENT:  Rectal bleeding probably due to the patient's internal  hemorrhoids, diverticular disease, no other significant cause.   PLAN:  Will give the patient a  hemorrhoid instruction sheet.  Have her  follow-up in the office with Dr. Darlina Sicilian.  We will see her back again as  needed.                                               James L. Malon Kindle., M.D.    Waldron Session  D:  01/02/2002  T:  01/02/2002  Job:  [id number]   cc:   Fransisco Hertz, M.D.  [address]  [place] [address]  Fax: [phone]

## 2010-07-29 NOTE — Consult Note (Signed)
[place]  Patient:    Jessica [name], Jessica [name]                      MRN: [id number] Proc. Date: 06/02/99 Adm. Date:  [id number] Attending:  Farley Ly CC:         Farley Ly, M.D.             Fransisco Hertz, M.D.             Raynelle Jan, M.D.                          Consultation Report  DATE OF BIRTH:  03/16/1940  HISTORY OF PRESENT ILLNESS:  Jessica [name] is a 69 year old black female who has a history of HIV positive, which has been well-controlled on her current medications. She presented with some vague pain on Wednesday, eight days ago, on May 25, 1999, then with worsening pains beginning about two days later on May 27, 1999. These pains she kind of complained of being right shoulder, right chest, maybe some abdominal symptoms.  She has had no nausea or vomiting.  No fever, no change in  bowel habits.  She then presented to the emergency room where she was admitted on May 31, 1999, with dyspnea, hypoxia, and pleuritic chest pain.  There were findings which were felt to be most consistent with pneumonia.  She was placed on Levaquin and also on IV fluids.  I think originally she was mildly hypoxic, but her blood gases have come up since admission.  She today was felt to be possibly having some right upper quadrant abdominal pain, which was ore impressive on different examinations.  She had an ultrasound which showed a solitary 12.0 mm gallstone, but no evidence of acute cholecystitis.  The patient has had no history of peptic ulcer disease, liver disease, or hepatitis, or pancreatic  disease.  She did have a history of diverticulitis which was seen in the emergency room up at [facility], but was not hospitalized.  She has never had a colon examination, so I am not really sure how they made the diagnosis of diverticulitis.  Interestingly, she knows she has had a gallstone for some 10-12 years, but  because it has been "asymptomatic" she has never had anything done about this.  She has no history of jaundice.  Her mother had gallbladder disease, as er only family history.  ALLERGIES:  No known drug allergies.  CURRENT MEDICATIONS: 1. Sustiva 600 mg q.d. 2. VDI 200 mg q.d., (which is Videx). 3. STC 150 mg b.i.d. (Epivir). 4. Lotensin 20 mg q.d. 5. Hydrochlorothiazide 25 mg q.d. 6. Reserpine 0.25 mg q.d.  REVIEW OF SYSTEMS:  Pulmonary:  She has had some pulmonary trouble before, though it has been remote, and not hospitalized for.  Cardiac:  She has no history of heart disease or chest pain, though she is treated for hypertension. Gastrointestinal:  See the history of present illness.  Urologic:  She had a total abdominal hysterectomy in the past.  SOCIAL HISTORY:  She is on disability right now.  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  PHYSICAL EXAMINATION:  VITAL SIGNS:  Temperature 97.9 degrees, blood pressure 150/85.  GENERAL:  She is a well-nourished black female, alert and cooperative on physical examination.  HEENT:  Unremarkable.  NECK:  Supple.  She has no palpable mass.  No thyromegaly.  LUNGS:  Clear to auscultation.  I do not hear any rub.  Does not have any crackles in either base.  HEART:  A regular rate and rhythm without murmur or rub.  CHEST/ABDOMEN:  She is somewhat tender over her right chest and right upper abdomen.  She has normal bowel sounds, but she certainly has no peritoneal sounds, no guarding.  I do not obtain a Murphys sign.  I got more of her pain more in the right chest, right upper quadrant, which I think is really more pulmonary than abdominal.  She has no abdominal hernia.  RECTAL:  Guaiac-negative stools.  LABORATORY DATA:  On the chart at the time of this dictation reveal a white blood count of 8600, with 45% neutrophils, 46% lymphocytes.   Her admission blood gas showed a pH of 7.44, PCO2 of 38, PO2 of 53.  Sodium 134, potassium 3.3, chloride 101, CO2 of 26, glucose 116.  SGOT 22, SGPT 13, alkaline phosphatase 65, bilirubin of 0.5.  Her PT is 13.8.  Chest x-ray:  She had bilateral lower lobe air space interstitial densities, question pneumonia versus aspiration.  This was done on the CT of her chest.  IMPRESSION: 1. Gallstone, with questionable right upper pain.  The patient both clinically    and by laboratory does not have acute cholecystitis, and I doubt acute    disease at this time.  RECOMMENDATIONS:  She would best be served with a hepatobiliary scan, to see whether she has an acute cystic duct obstruction.  Her white blood count was noted to be normal, bilirubin normal, alkaline phosphatase normal.  She is afebrile, ith really no peritoneal signs.  Also I will check an amylase on her.  2. History of diverticulitis at [facility], though it is not really    clear how they established this diagnosis.  She denies she had a barium    enema or colonoscopy. 3. Human immunodeficiency virus positive, well-controlled on her current    medications. 4. Hypoxia with probable pneumonia, pneumonitis. DD:  06/02/99 TD:  06/02/99 Job: [id number]

## 2010-07-29 NOTE — Op Note (Signed)
[place]  Patient:    Jessica [name], Jessica [name]                      MRN: [id number] Proc. Date: 07/07/99 Adm. Date:  [id number] Attending:  Andre Lefort Dictator:   Sandria Bales. Ezzard Standing, M.D. CC:         Fransisco Hertz, M.D.             Farley Ly, M.D.                           Operative Report  DATE OF BIRTH:  [birth year]  PREOPERATIVE DIAGNOSIS:  Symptomatic gallstones.  POSTOPERATIVE DIAGNOSIS:  Symptomatic gallstones.  PROCEDURE:  Laparoscopic cholecystectomy with intraoperative cholangiogram.  SURGEON:  Dr. Ezzard Standing.  ASSISTANT:  Jimmye Norman, M.D.  ANESTHESIA: General endotracheal anesthesia.  ESTIMATED BLOOD LOSS:  Minimal.  INDICATIONS FOR PROCEDURE:  [name] is a 70 year old HIV black female who was recently hospitalized for pneumonia.  She has also been known to have a gallstone, and there is a question of whether she has actually been having symptoms from this gallstone or not.  She now comes for laparoscopic cholecystectomy.  DESCRIPTION OF PROCEDURE:  The patient was placed in a supine position, given a general anesthesia.  She had an orogastric tube in place.  One gram of Ancef was given beginning of procedure and PDS stockings in place.  Her abdomen was prepped with Betadine solution and sterilely draped.  An infraumbilical incision was made with sharp dissection, carried down to the abdominal cavity.  A zero degree laparoscope was inserted through a 12 mm Hasson trocar.  This was secured with a 0 Vicryl suture.  Abdominal explorations carried out.  Revealed one or two adhesive bands over the right lobe of the liver.  The liver otherwise looked healthy and good.  The stomach was unremarkable.  The gallbladder had adhesions of about 50% of the gallbladder.  The bowel was covered pretty much with omentum, what little bowel I could see.  Normal in appearance.  Three additional trocars were placed, a 10 mm  Ethicon trocar in the subxiphoid location, a 5 mm Ethicon trocar in the right midsubcostal location, a 5 mm Ethicon trocar in the right lateral subcostal location.  Each trocar site was infiltrated with 0.25% Marcaine.  The gallbladder was then grasped.  I dissected the adhesions off the bottom half of the gallbladder, these appeared old and chronic.  I then tried to ultrasound both the liver and the gallbladder.  The liver appearance was normal when homogenous, the gallbladder appeared to have at least one single stone.  There was no dilatation of the common bile duct that I could appreciate.  The cystic duct and cystic artery were identified.  The cystic artery was triply clipped and divided.  A single clip was placed in the gallbladder side of the cystic duct.  The intraoperative cholangiogram was then obtained using a cutoff taut catheter inserted through a Jelco catheter into the abdominal cavity, and then inserted into the cystic duct, and secured with a Endoclip.  Intraoperative cholangiogram using 1/2 strength high opaque solution revealed free flow of contrast down the cystic duct into the common bile duct into the duodenum. There was no obstruction, no mass, no filling defect.  This was felt to be a normal intraoperative cholangiogram.  The ______  catheter was then removed, and the cystic duct  was triply endoclipped.  The gallbladder was then divided from the cystic duct, and the gallbladder was then sharply and bluntly divided from the gallbladder bed using primarily hook Bovie coagulation, though I did use a few more clips.  Prior to complete division of the gallbladder from the gallbladder bed I visualized the triangle of Calot and the gallbladder bed, saw no bile leak and no bleeding.  The gallbladder then delivered through the umbilicus intact and sent to pathology.  The abdomen was irrigated, and the umbilical port closed with a 0 Vicryl suture.  All the other ports  were visualized.  There was no bleeding.  The skin at each level was closed with a 5-0 Vicryl suture.  The patient tolerated the procedure well, was transported to the recovery room in good condition.  Sponge and needle count were correct at the end of the case.  Again, each wound was closed with a 5-0 Vicryl, painted with benzoin, Steri-Strips, and sterilely dressed. DD:  07/07/99 TD:  07/07/99 Job: [id number]

## 2010-07-29 NOTE — Group Therapy Note (Signed)
NAME:  Jessica [name], Jessica [name] NO.:  [id number]   MEDICAL RECORD NO.:  [id number]                   PATIENT TYPE:  OUT   LOCATION:  [facility]                           FACILITY:  [facility]   PHYSICIAN:  Dorthula Perfect, MD                  DATE OF BIRTH:  [birth year]   DATE OF SERVICE:  10/03/2002                                    CLINIC NOTE   HISTORY:  This 70 year old black female HIV positive is status post  hysterectomy with salpingo-oophorectomy bilaterally in 1971.  She apparently  has had several abnormal Pap smears and underwent colposcopy in 2002 which  was normal.  Her Pap smear Aug 01, 2002 was read as a low grade squamous  intraepithelial lesion, CIN I/VAIN I/HPV.  An HPV typing was done which  revealed high risk HPV.  She is here for colposcopy.   PAST MEDICAL HISTORY:  Reviewed.   CURRENT MEDICATIONS:  1. Reserpine 0.25 mg daily.  2. HCTZ 25 mg daily.  3. Lotensin 40 mg daily.  4. She is also on an antiviral regimen under the care of Darlina Sicilian, M.D.   PHYSICAL EXAMINATION:  VITAL SIGNS:  Blood pressure 193/83, weight 146.  GENERAL:  The patient is very nervous about having this examination  performed.  She had been given some OxyContin by Dr. Maurice March to take right  before this examination.  PELVIC:  Her external genitalia is completely normal.  Vaginal vault is  visualized and looks completely normal.  The vaginal cuff is likewise  normal.  No gross lesions are seen.   PROCEDURE:  Colposcope was positioned.  The vagina was treated with acetic  acid.  Colposcopy was basically normal with one exception.  At approximately  the 3-4 o'clock position on the vaginal cuff there was a small area with  small punctations noted.  Everything else was absolutely normal.  This was  explained to the patient.  After a few minutes' delay while the nursing  assistant got me a spinal needle and some lidocaine, lidocaine with  epinephrine about 4 mL was injected  into this site so that the vaginal  biopsy would be painless.  One directed biopsy was obtained.  Biopsy site  did not bleed.  Monsel solution was applied.   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   DISPOSITION:  Directed biopsy was obtained.  The patient will not be  scheduled for repeat visit, but instead when the result comes back she will  be contacted by phone and we will go from there.                                               Dorthula Perfect, MD   ER/MEDQ  D:  10/03/2002  T:  10/03/2002  Job:  [id number]   cc:   Darlina Sicilian, M.D.

## 2010-07-29 NOTE — Consult Note (Signed)
NAME:  Jessica [name], Jessica [name]                       ACCOUNT NO.:  [id number]   MEDICAL RECORD NO.:  [id number]                   PATIENT TYPE:  OUT   LOCATION:  GYN                                  [facility]AC[facility]  [facility]   PHYSICIAN:  John T. Kyla Balzarine, M.D.                 DATE OF BIRTH:  [birth year]   DATE OF CONSULTATION:  DATE OF DISCHARGE:                                   CONSULTATION   CHIEF COMPLAINT:  This 70 year old para 4 woman is seen at the request of  Dr. Burnadette Peter for consultation regarding management of abnormal  cytology.   HISTORY OF PRESENT ILLNESS:  The patient underwent simple hysterectomy for  leiomyomata in 1971.  She subsequently underwent BSO for benign ovarian  cyst.  She was never treated for condylomata, STD or abnormal Pap smears  until 1992 when she had the first abnormal Pap smear.  Coincidentally, this  was four years after HIV infection was diagnosed.  She has had  intermittently abnormal Pap smears since, never above a low-grade lesion and  per her report never requiring treatment.  She had a Pap smear May 2004 read  as low grade SIL and HPV typing revealed high risk HPV.  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  In retrospect, the patient  has been off of all hormonal replacement since shortly after her bilateral  salpingo-oophorectomy.  She does note vaginal dryness and lack of  lubrication during intercourse.  She denies any vaginal bleeding.   PAST MEDICAL HISTORY:  1. Significant for glaucoma.  2. Hypertension.  3. Degenerative joint disease.  4. Fibrocystic breasts.  5. HIV infection.   PRIOR SURGICAL HISTORY:  1. TAH-BSO.  2. C-section x 2.   MEDICATIONS:  1. Reserpine.  2. Hydrochlorothiazide.  3. Lotensin.  4. Combination antiviral regimen with Videx, AZT and Sustiva.   FAMILY MEDICAL HISTORY:  Noncontributory.   PERSONAL SOCIAL HISTORY:  The patient is married, admits one pack per day  tobacco use and  denies ethanol.   REVIEW OF SYSTEMS:  Otherwise negative.   EXAMINATION:  VITAL SIGNS:  Blood pressure 165/80, pulse 72, respirations  18, weight 146.5 pounds.  GENERAL:  The patient is anxious, alert and oriented x 3, in no acute  distress.  ABDOMEN:  Soft and benign with well healed incision without hernia, ascites,  mass or tenderness.  There is no back or CVA tenderness.  There are slightly  rubbery groin nodes but none pathologically enlarged or hard.  GENITALIA:  External genitalia have no condylomatous or hypertrophic  lesions.  The vaginal vault is clear with atrophic mucosa.  There are no  grossly abnormal areas of mucosa.  Bimanual and rectovaginal examinations  feel absent uterus and cervix without mass or nodularity.   PROCEDURE NOTE:  After verbal informed consent was obtained, I performed  colposcopy of the entire vaginal canal.  Healing  biopsy site was seen at  approximately 4 o'clock on the vaginal cuff.  No acetowhite epithelium was  visualized and no biopsies taken.   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   I had a long discussion with the patient regarding the nature of her disease  in relationship to the immune system to control.  We discussed advisability  of quitting tobacco use but she did not believe that this would be likely.  I recommended that she use vaginal Premarin once or twice weekly in an  attempt to improve her vaginal atrophy.  This might improve lubrication and  more importantly improve tolerance of the HPV virus.  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  These could be obtained by Dr. Perlie Gold at the [facility]  and we would be glad to see her if there were cytologic or colposcopically  directed  evidence of a  more severe dysplasia.                                               John T. Kyla Balzarine, M.D.    JTS/MEDQ  D:  11/05/2002  T:  11/06/2002  Job:  [id number]   cc:   Clement Husbands, M.D.  [address]  [place]  [place] [address]  Fax: [phone]   Fransisco Hertz, M.D.  [address]  [place] [address]  Fax: [phone]   Telford Nab, R.N.  [phone] [address]  [place], [place] [address]

## 2010-08-04 ENCOUNTER — Ambulatory Visit (INDEPENDENT_AMBULATORY_CARE_PROVIDER_SITE_OTHER): Payer: Medicare Other | Admitting: Adult Health

## 2010-08-04 ENCOUNTER — Ambulatory Visit: Payer: Medicare Other | Admitting: Infectious Diseases

## 2010-08-04 VITALS — BP 170/71 | HR 77 | Temp 98.4°F | Ht 67.0 in | Wt 139.0 lb

## 2010-08-04 DIAGNOSIS — M5126 Other intervertebral disc displacement, lumbar region: Secondary | ICD-10-CM

## 2010-08-04 MED ORDER — HYDROCODONE-ACETAMINOPHEN 10-325 MG PO TABS: 1.0000 | ORAL_TABLET | Freq: Four times a day (QID) | ORAL | Status: DC | PRN

## 2010-08-04 MED ORDER — FENTANYL 50 MCG/HR TD PT72: 1.0000 | MEDICATED_PATCH | TRANSDERMAL | Status: DC

## 2010-08-04 NOTE — Progress Notes (Signed)
Presents to clinic for reevaluation of her pain medication. She relates that she was originally given fentanyl 75 mcg/hour patches with hydrocodone/APAP 10/660 for breakthrough pain. She states that this became "too much." She was then given fentanyl 25 MCG/hour. She states that she was not receiving enough pain relief. However, when asked how she took her breakthrough medications, she stated that whether or not. She had pain. She was taking the breakthrough medications. Based upon this assessment, it is possible that she may have been receiving adequate pain relief at the 75 MCG/hour dose, but was taking too much of her breakthrough medication. After careful discussion on how to take her pain medication, we concluded that we should try fentanyl at 50 mcg/hour patch every 3 days and continue the current dose of hydrocodone/APAP, but have her document how often and frequently. She is necessarily need to take this medication. She verbally acknowledged her understanding of the rationale behind the use of the fentanyl and the breakthrough pain medication. We wrote prescriptions for fentanyl 50 mcg/hour to provide 5 patches and provided the hydrocodone/APAP at 10/325 with 50 tablets, which she is to document when she takes 1 for breakthrough pain, and during that back, along with her medications to clinic within the next 2 weeks for followup. She verbally acknowledged all this information and agreed with the plan of care.

## 2010-08-17 ENCOUNTER — Other Ambulatory Visit: Payer: Self-pay | Admitting: *Deleted

## 2010-08-17 NOTE — Telephone Encounter (Signed)
Spoke with pt about her pain control and which hydrocodone rx she is using.  She has an appt with Dr. Maurice March 08/18/10.  RN and pt will talk at that time. Jennet Maduro, RN.

## 2010-08-18 ENCOUNTER — Other Ambulatory Visit: Payer: Self-pay | Admitting: *Deleted

## 2010-08-18 ENCOUNTER — Encounter: Payer: Self-pay | Admitting: Adult Health

## 2010-08-18 ENCOUNTER — Ambulatory Visit (INDEPENDENT_AMBULATORY_CARE_PROVIDER_SITE_OTHER): Payer: Medicare Other | Admitting: Adult Health

## 2010-08-18 DIAGNOSIS — S80861A Insect bite (nonvenomous), right lower leg, initial encounter: Secondary | ICD-10-CM

## 2010-08-18 DIAGNOSIS — W57XXXA Bitten or stung by nonvenomous insect and other nonvenomous arthropods, initial encounter: Secondary | ICD-10-CM

## 2010-08-18 DIAGNOSIS — G894 Chronic pain syndrome: Secondary | ICD-10-CM

## 2010-08-18 DIAGNOSIS — M5126 Other intervertebral disc displacement, lumbar region: Secondary | ICD-10-CM

## 2010-08-18 MED ORDER — FENTANYL 50 MCG/HR TD PT72: 1.0000 | MEDICATED_PATCH | TRANSDERMAL | Status: DC

## 2010-08-18 MED ORDER — HYDROCODONE-ACETAMINOPHEN 7.5-325 MG PO TABS: 1.0000 | ORAL_TABLET | Freq: Four times a day (QID) | ORAL | Status: DC | PRN

## 2010-08-18 NOTE — Progress Notes (Signed)
  Subjective:    Patient ID: Jessica [name], female    DOB: [birth year], 70 y.o.   MRN: [id number]  HPI Kaleiah returns today for routine scheduled followup after changing dosing of her fentanyl. She states that she gets initial pain relief for the first day and one half, then, pain relief dissipates, and she is required to use more breakthrough medication. She is requesting, perhaps, a different brand of fentanyl patch to ascertain whether this may help her. She believes that the current dose of 50 mcg/hour is the appropriate dose for her. However, she would like a smaller dose of hydrocodone for breakthrough pain. She also relates that she had a "tick bite"to her right thigh 2 days ago, and a small, red bump appeared, at the site, where she pulled the tick off. She endorses that she believes she removed the head of the tick, but she is concerned that this still might be in.   Review of Systems  Constitutional: Negative.   HENT: Negative.   Eyes: Negative.   Respiratory: Negative.   Cardiovascular: Negative.   Musculoskeletal: Positive for back pain, arthralgias and gait problem.       Objective:   Physical Exam  Skin:       Small raised, singular papule with darker center noted to write, anterior leg, superior to the right knee. No engorgement noted in the center, and no identifiable insect parts observed on examination.          Assessment & Plan:  1. 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 We will continue the fentanyl at 50 mcg/hour, but will prescribe [employer] brand , fentanyl, and see, if there's better control. We will also change her hydrocodone/APAP to 7.5/325 by mouth every 6 hours and have her do a pill count to see how much breakthrough medication. She is required. We'll have her return to clinic in 2 more weeks for followup regarding this.  2. Insect Bite. It is doubtful that the head remains in place, and she is just having a localized  reaction to the bite. Instructed her to apply triple antibiotic ointment to the area at least twice daily, and will monitor this on her return followup. Additionally, instructions were given that should she be in tall grass or wooded areas. She should be wearing long pants and long sleeves and hands. Should be talked into her shoes. She verbally acknowledged this and agreed with plan of care.

## 2010-08-18 NOTE — Telephone Encounter (Signed)
RN spoke with pt.  She is concerned about how the fentanyl patch is working for her.  She stated that on the first and second days after applying the patch she gets sleepy and on the third day she doesn't get pain relief.  RN discussed that there are several different manufactures of the patch.  Some distribute the medication more evenly than others.  The [facility] brand has been found to distribute evenly.  Alessia called the local [facility] and they routinely stock this brand.  She also shared that if the patch would keep the majority of her pain controlled she wouldn't have to use as much "breakthrough" oral medication.  She agreed that she could use the hydrocodone/APAP dose of 7.5/500 for the next month while she is also trying the

## 2010-09-01 ENCOUNTER — Ambulatory Visit (INDEPENDENT_AMBULATORY_CARE_PROVIDER_SITE_OTHER): Payer: Medicare Other | Admitting: Adult Health

## 2010-09-01 ENCOUNTER — Other Ambulatory Visit (INDEPENDENT_AMBULATORY_CARE_PROVIDER_SITE_OTHER): Payer: Medicare Other | Admitting: Licensed Clinical Social Worker

## 2010-09-01 ENCOUNTER — Encounter: Payer: Self-pay | Admitting: Adult Health

## 2010-09-01 DIAGNOSIS — I10 Essential (primary) hypertension: Secondary | ICD-10-CM

## 2010-09-01 DIAGNOSIS — L219 Seborrheic dermatitis, unspecified: Secondary | ICD-10-CM

## 2010-09-01 DIAGNOSIS — G894 Chronic pain syndrome: Secondary | ICD-10-CM

## 2010-09-01 DIAGNOSIS — M5126 Other intervertebral disc displacement, lumbar region: Secondary | ICD-10-CM

## 2010-09-01 HISTORY — DX: Seborrheic dermatitis, unspecified: L21.9

## 2010-09-01 MED ORDER — BENAZEPRIL-HYDROCHLOROTHIAZIDE 20-12.5 MG PO TABS: 2.0000 | ORAL_TABLET | Freq: Every day | ORAL | Status: DC

## 2010-09-01 MED ORDER — HYDROCODONE-ACETAMINOPHEN 7.5-325 MG PO TABS: 1.0000 | ORAL_TABLET | Freq: Four times a day (QID) | ORAL | Status: DC | PRN

## 2010-09-01 MED ORDER — TRIAMCINOLONE ACETONIDE 0.1 % EX CREA: 1.0000 "application " | TOPICAL_CREAM | Freq: Three times a day (TID) | CUTANEOUS | Status: DC

## 2010-09-01 MED ORDER — FENTANYL 50 MCG/HR TD PT72: 1.0000 | MEDICATED_PATCH | TRANSDERMAL | Status: DC

## 2010-09-01 NOTE — Progress Notes (Signed)
  Subjective:    Patient ID: Jessica [name], female    DOB: Nov 10, 1940, 70 y.o.   MRN: [id number]  HPI Present to clinic today for scheduled followup and management of her sentinel therapy. Reports that she has on average 1-1.5. Breakthrough episodes per day since last visit with most episodes occurring on the days. In between changing patches   Review of Systems  Constitutional: Negative.   HENT: Negative.   Eyes: Negative.   Respiratory: Negative.   Cardiovascular: Negative.   Gastrointestinal: Negative.   Genitourinary: Negative.   Musculoskeletal: Positive for back pain and arthralgias.  Skin: Negative.   Neurological: Negative.   Hematological: Negative.   Psychiatric/Behavioral: Negative.        Objective:   Physical Exam  Constitutional: She is oriented to person, place, and time. She appears well-developed and well-nourished.  HENT:  Head: Normocephalic and atraumatic.  Eyes: Conjunctivae are normal. Pupils are equal, round, and reactive to light.  Neck: Normal range of motion. Neck supple.  Cardiovascular: Normal rate and regular rhythm.   Pulmonary/Chest: Effort normal and breath sounds normal.  Abdominal: Soft. Bowel sounds are normal.  Musculoskeletal: Normal range of motion.  Neurological: She is alert and oriented to person, place, and time.  Skin: Skin is warm and dry.  Psychiatric: She has a normal mood and affect. Her behavior is normal. Judgment and thought content normal.          Assessment & Plan:   1. Chronic Pain Syndrome. Given the current frequency of breakthrough episodes, we should continue her current regimen of fentanyl 50 mcg/hour. Every 3 days and the same dose of hydrocodone/APAP. We will assess. She return in one month with her record of breakthrough episodes to determine if dose. Increase is necessary.  She verbally acknowledged this information and agreed with plan of care.

## 2010-10-07 ENCOUNTER — Ambulatory Visit (INDEPENDENT_AMBULATORY_CARE_PROVIDER_SITE_OTHER): Payer: Medicare Other | Admitting: Adult Health

## 2010-10-07 ENCOUNTER — Encounter: Payer: Self-pay | Admitting: Adult Health

## 2010-10-07 ENCOUNTER — Ambulatory Visit: Payer: Medicare Other

## 2010-10-07 DIAGNOSIS — G894 Chronic pain syndrome: Secondary | ICD-10-CM

## 2010-10-07 DIAGNOSIS — M5126 Other intervertebral disc displacement, lumbar region: Secondary | ICD-10-CM

## 2010-10-07 MED ORDER — FENTANYL 50 MCG/HR TD PT72: 1.0000 | MEDICATED_PATCH | TRANSDERMAL | Status: DC

## 2010-10-07 MED ORDER — HYDROCODONE-ACETAMINOPHEN 7.5-325 MG PO TABS: 1.0000 | ORAL_TABLET | Freq: Four times a day (QID) | ORAL | Status: DC | PRN

## 2010-10-18 ENCOUNTER — Other Ambulatory Visit: Payer: Self-pay | Admitting: *Deleted

## 2010-10-18 DIAGNOSIS — E785 Hyperlipidemia, unspecified: Secondary | ICD-10-CM

## 2010-10-18 MED ORDER — LOVASTATIN 40 MG PO TABS: 40.0000 mg | ORAL_TABLET | Freq: Every day | ORAL | Status: DC

## 2010-10-27 ENCOUNTER — Ambulatory Visit (INDEPENDENT_AMBULATORY_CARE_PROVIDER_SITE_OTHER): Payer: Medicare Other | Admitting: *Deleted

## 2010-10-27 ENCOUNTER — Encounter: Payer: Self-pay | Admitting: *Deleted

## 2010-10-27 VITALS — BP 151/72 | HR 81 | Temp 98.2°F | Ht 68.0 in | Wt 128.2 lb

## 2010-10-27 DIAGNOSIS — E785 Hyperlipidemia, unspecified: Secondary | ICD-10-CM

## 2010-10-27 DIAGNOSIS — B20 Human immunodeficiency virus [HIV] disease: Secondary | ICD-10-CM

## 2010-10-27 LAB — COMPREHENSIVE METABOLIC PANEL
ALT: 14 U/L (ref 0–35)
BUN: 17 mg/dL (ref 6–23)
CO2: 25 mEq/L (ref 19–32)
Creat: 0.63 mg/dL (ref 0.50–1.10)
Total Bilirubin: 0.3 mg/dL (ref 0.3–1.2)

## 2010-10-27 LAB — LIPID PANEL
Cholesterol: 153 mg/dL (ref 0–200)
HDL: 73 mg/dL (ref >39)
Total CHOL/HDL Ratio: 2.1 Ratio
Triglycerides: 63 mg/dL (ref <150)
VLDL: 13 mg/dL (ref 0–40)

## 2010-10-27 MED ORDER — ENSURE PO LIQD: 237.0000 mL | Freq: Every day | ORAL | Status: DC

## 2010-10-27 NOTE — Progress Notes (Signed)
Jessica [name] was in today for her week 784 ALLRT study visit. She unfortunately has lost 16 lbs since April unintentionally. She says that she has been feeling very anxious and nervous and has no appetite. She recently moved out of her home and has been sorting through her belongings to make room at the house for her children. She moved into a one bedroom apartment for seniors where she could have some privacy and get rest, which she likes. She has continued to work 5 days a week at [employer] and feels like it is becoming too much for her to handle. We discussed strategies for cutting back on what she is doing, taking some time off for herself, etc. Her BMI now qualifies for ensure supplementation and [facility] is getting her some today. She denies any other complaints and says that the fentanyl patch is controlling her pain well. She will return in December for the next study visit.

## 2010-11-07 ENCOUNTER — Telehealth: Payer: Self-pay | Admitting: *Deleted

## 2010-11-07 DIAGNOSIS — M5126 Other intervertebral disc displacement, lumbar region: Secondary | ICD-10-CM

## 2010-11-07 DIAGNOSIS — G894 Chronic pain syndrome: Secondary | ICD-10-CM

## 2010-11-07 MED ORDER — FENTANYL 50 MCG/HR TD PT72: 1.0000 | MEDICATED_PATCH | TRANSDERMAL | Status: DC

## 2010-11-07 NOTE — Telephone Encounter (Signed)
Patient called it is time to have her patches refilled will print and forward to the provider for sig.

## 2010-11-08 ENCOUNTER — Other Ambulatory Visit: Payer: Self-pay | Admitting: *Deleted

## 2010-11-08 NOTE — Telephone Encounter (Signed)
Fentanyl patch refilled on 11/07/10.  Jennet Maduro, RN

## 2010-11-09 ENCOUNTER — Other Ambulatory Visit: Payer: Self-pay | Admitting: Infectious Diseases

## 2010-11-11 ENCOUNTER — Other Ambulatory Visit: Payer: Self-pay | Admitting: *Deleted

## 2010-11-11 DIAGNOSIS — M5126 Other intervertebral disc displacement, lumbar region: Secondary | ICD-10-CM

## 2010-11-11 DIAGNOSIS — G894 Chronic pain syndrome: Secondary | ICD-10-CM

## 2010-11-11 MED ORDER — CYCLOBENZAPRINE HCL 10 MG PO TABS: 10.0000 mg | ORAL_TABLET | Freq: Three times a day (TID) | ORAL | Status: DC | PRN

## 2010-11-11 MED ORDER — HYDROCODONE-ACETAMINOPHEN 7.5-325 MG PO TABS: 1.0000 | ORAL_TABLET | Freq: Four times a day (QID) | ORAL | Status: DC | PRN

## 2010-11-11 MED ORDER — ALPRAZOLAM 1 MG PO TABS: 1.0000 mg | ORAL_TABLET | Freq: Three times a day (TID) | ORAL | Status: DC | PRN

## 2010-11-15 ENCOUNTER — Other Ambulatory Visit: Payer: Self-pay | Admitting: *Deleted

## 2010-11-15 DIAGNOSIS — G894 Chronic pain syndrome: Secondary | ICD-10-CM

## 2010-11-15 DIAGNOSIS — M5126 Other intervertebral disc displacement, lumbar region: Secondary | ICD-10-CM

## 2010-11-15 MED ORDER — ALPRAZOLAM 1 MG PO TABS: 1.0000 mg | ORAL_TABLET | Freq: Three times a day (TID) | ORAL | Status: DC | PRN

## 2010-11-15 MED ORDER — CYCLOBENZAPRINE HCL 10 MG PO TABS: 10.0000 mg | ORAL_TABLET | Freq: Three times a day (TID) | ORAL | Status: DC | PRN

## 2010-11-15 NOTE — Telephone Encounter (Signed)
I had filled #30 of the above meds. Should have been #90. I called them in today with #60 to bring it up to what she should have been given, #90. Pt aware

## 2010-11-17 ENCOUNTER — Encounter: Payer: Self-pay | Admitting: Infectious Diseases

## 2010-11-17 LAB — CD4/CD8 (T-HELPER/T-SUPPRESSOR CELL)
CD4%: 38.9
CD4: 1167
CD8 % Suppressor T Cell: 44

## 2010-11-17 LAB — HIV-1 RNA QUANT-NO REFLEX-BLD: HIV-1 RNA Viral Load: <40

## 2010-12-01 ENCOUNTER — Other Ambulatory Visit: Payer: Self-pay | Admitting: *Deleted

## 2010-12-01 DIAGNOSIS — G894 Chronic pain syndrome: Secondary | ICD-10-CM

## 2010-12-01 DIAGNOSIS — M5126 Other intervertebral disc displacement, lumbar region: Secondary | ICD-10-CM

## 2010-12-01 DIAGNOSIS — A6000 Herpesviral infection of urogenital system, unspecified: Secondary | ICD-10-CM

## 2010-12-01 MED ORDER — ACYCLOVIR 400 MG PO TABS: 400.0000 mg | ORAL_TABLET | Freq: Three times a day (TID) | ORAL | Status: DC

## 2010-12-01 NOTE — Telephone Encounter (Signed)
Spoke with pt.  Uses [facility], [address] [place], [place] for Acyclovir and Atripla rxes.  Moving all her other rxes to [facility].  Requested that new Narcotic Contract be prepared for signature indicating [facility] as her pharmacy.  Pt will be coming Sept 27th to pick up Fentanyl patch rx.  Will sign contract at that time.  Jennet Maduro, RN

## 2010-12-05 ENCOUNTER — Telehealth: Payer: Self-pay | Admitting: *Deleted

## 2010-12-05 LAB — BLOOD GAS, ARTERIAL
Acid-base deficit: 1.7
Bicarbonate: 24.6 — ABNORMAL HIGH
Drawn by: 274481
O2 Saturation: 96.3
TCO2: 23.5
TCO2: 25.8
pCO2 arterial: 39.2
pH, Arterial: 7.415 — ABNORMAL HIGH
pO2, Arterial: 137 — ABNORMAL HIGH
pO2, Arterial: 85.2

## 2010-12-05 LAB — URINALYSIS, ROUTINE W REFLEX MICROSCOPIC
Bilirubin Urine: NEGATIVE
Hgb urine dipstick: NEGATIVE
Protein, ur: NEGATIVE
Urobilinogen, UA: 0.2

## 2010-12-05 LAB — CROSSMATCH

## 2010-12-05 LAB — BASIC METABOLIC PANEL
BUN: 2 — ABNORMAL LOW
BUN: 4 — ABNORMAL LOW
BUN: 2 — ABNORMAL LOW
CO2: 25
CO2: 21
CO2: 21
CO2: 22
CO2: 23
Calcium: 8.1 — ABNORMAL LOW
Calcium: 8.2 — ABNORMAL LOW
Calcium: 8.3 — ABNORMAL LOW
Chloride: 104
Chloride: 106
Chloride: 107
Chloride: 103
Chloride: 105
Chloride: 106
Chloride: 111
Creatinine, Ser: 0.48
Creatinine, Ser: 0.48
Creatinine, Ser: 0.44
Creatinine, Ser: 0.48
Creatinine, Ser: 0.5
Creatinine, Ser: 0.61
GFR calc Af Amer: >60
GFR calc Af Amer: >60
GFR calc Af Amer: >60
GFR calc Af Amer: >60
GFR calc non Af Amer: >60
Glucose, Bld: 144 — ABNORMAL HIGH
Glucose, Bld: 160 — ABNORMAL HIGH
Glucose, Bld: 134 — ABNORMAL HIGH
Glucose, Bld: 96
Glucose, Bld: 96
Potassium: 3 — ABNORMAL LOW
Potassium: 3 — ABNORMAL LOW
Sodium: 137
Sodium: 132 — ABNORMAL LOW
Sodium: 133 — ABNORMAL LOW
Sodium: 136

## 2010-12-05 LAB — CBC
HCT: 29 — ABNORMAL LOW
HCT: 30.8 — ABNORMAL LOW
HCT: 32.3 — ABNORMAL LOW
HCT: 36.6
HCT: 30.9 — ABNORMAL LOW
Hemoglobin: 10.2 — ABNORMAL LOW
Hemoglobin: 10.7 — ABNORMAL LOW
Hemoglobin: 12.6
Hemoglobin: 10.6 — ABNORMAL LOW
Hemoglobin: 8.5 — ABNORMAL LOW
Hemoglobin: 8.9 — ABNORMAL LOW
Hemoglobin: 9.8 — ABNORMAL LOW
MCHC: 34.8
MCHC: 34.1
MCHC: 34.2
MCHC: 34.2
MCHC: 34.4
MCV: 97.5
MCV: 98.3
MCV: 98.6
MCV: 97.8
MCV: 97.9
MCV: 98.7
Platelets: 198
Platelets: 212
RBC: 2.54 — ABNORMAL LOW
RBC: 2.64 — ABNORMAL LOW
RBC: 2.94 — ABNORMAL LOW
RBC: 3.13 — ABNORMAL LOW
RDW: 12
RDW: 12.3
RDW: 12.4
RDW: 12.8
WBC: 10.9 — ABNORMAL HIGH
WBC: 8.1
WBC: 8.5
WBC: 7.8

## 2010-12-05 LAB — COMPREHENSIVE METABOLIC PANEL
ALT: 10
Albumin: 2.3 — ABNORMAL LOW
Alkaline Phosphatase: 48
Alkaline Phosphatase: 74
BUN: 7
BUN: 8
Chloride: 104
Glucose, Bld: 112 — ABNORMAL HIGH
Glucose, Bld: 164 — ABNORMAL HIGH
Potassium: 2.9 — ABNORMAL LOW
Potassium: 3.5
Sodium: 135
Total Bilirubin: 0.4
Total Bilirubin: 0.6
Total Protein: 7.8

## 2010-12-05 LAB — PROTIME-INR
INR: 1
INR: 1.1
Prothrombin Time: 13

## 2010-12-05 LAB — POCT I-STAT 7, (LYTES, BLD GAS, ICA,H+H)
Acid-Base Excess: 3 — ABNORMAL HIGH
Bicarbonate: 27.3 — ABNORMAL HIGH
Hemoglobin: 10.9 — ABNORMAL LOW
Hemoglobin: 9.2 — ABNORMAL LOW
Operator id: 230421
Patient temperature: 35.7
Potassium: 2.8 — ABNORMAL LOW
Potassium: 2.9 — ABNORMAL LOW
Sodium: 139
TCO2: 28
TCO2: 28
pCO2 arterial: 27.7 — ABNORMAL LOW
pH, Arterial: 7.486 — ABNORMAL HIGH
pH, Arterial: 7.599 — ABNORMAL HIGH

## 2010-12-05 LAB — AMYLASE
Amylase: 126
Amylase: 210 — ABNORMAL HIGH

## 2010-12-05 LAB — DIFFERENTIAL
Basophils Relative: 0
Eosinophils Absolute: 0.1
Monocytes Absolute: 0.6
Monocytes Relative: 6

## 2010-12-05 LAB — POTASSIUM
Potassium: 3.3 — ABNORMAL LOW
Potassium: 3.4 — ABNORMAL LOW

## 2010-12-05 LAB — MAGNESIUM: Magnesium: 2

## 2010-12-05 NOTE — Telephone Encounter (Signed)
Pt had previous lower extremity bypass graft in 2009.  Pt wanting to know whether she needs antibiotics prior to the Dental Clinic through [facility] this week.  RN spoke with Dr. Maurice March.  Dr. Maurice March stated that the pt did not need to take antibiotics prior to dental work.  Dr. Maurice March stated that if the dentist needs to speak with him about this he can page him at [phone].  RN phoned that pt and shared this information.  Pt verbalized understanding.  Jennet Maduro, RN

## 2010-12-06 ENCOUNTER — Other Ambulatory Visit: Payer: Self-pay | Admitting: *Deleted

## 2010-12-06 DIAGNOSIS — M5126 Other intervertebral disc displacement, lumbar region: Secondary | ICD-10-CM

## 2010-12-06 DIAGNOSIS — G894 Chronic pain syndrome: Secondary | ICD-10-CM

## 2010-12-06 MED ORDER — FENTANYL 50 MCG/HR TD PT72: 1.0000 | MEDICATED_PATCH | TRANSDERMAL | Status: DC

## 2010-12-08 ENCOUNTER — Encounter: Payer: Self-pay | Admitting: Infectious Diseases

## 2010-12-08 ENCOUNTER — Ambulatory Visit (INDEPENDENT_AMBULATORY_CARE_PROVIDER_SITE_OTHER): Payer: Medicare Other | Admitting: Infectious Diseases

## 2010-12-08 VITALS — BP 186/79 | HR 70 | Temp 98.1°F | Wt 129.0 lb

## 2010-12-08 DIAGNOSIS — G894 Chronic pain syndrome: Secondary | ICD-10-CM

## 2010-12-08 DIAGNOSIS — L309 Dermatitis, unspecified: Secondary | ICD-10-CM

## 2010-12-08 DIAGNOSIS — M5126 Other intervertebral disc displacement, lumbar region: Secondary | ICD-10-CM

## 2010-12-08 DIAGNOSIS — L259 Unspecified contact dermatitis, unspecified cause: Secondary | ICD-10-CM

## 2010-12-08 MED ORDER — FENTANYL 50 MCG/HR TD PT72: 1.0000 | MEDICATED_PATCH | TRANSDERMAL | Status: DC

## 2010-12-08 MED ORDER — CYCLOBENZAPRINE HCL 10 MG PO TABS: 10.0000 mg | ORAL_TABLET | Freq: Three times a day (TID) | ORAL | Status: DC | PRN

## 2010-12-08 MED ORDER — HYDROCODONE-ACETAMINOPHEN 7.5-325 MG PO TABS: 1.0000 | ORAL_TABLET | Freq: Four times a day (QID) | ORAL | Status: DC | PRN

## 2010-12-08 MED ORDER — ALPRAZOLAM 1 MG PO TABS: 1.0000 mg | ORAL_TABLET | Freq: Three times a day (TID) | ORAL | Status: DC | PRN

## 2010-12-08 MED ORDER — TRIAMCINOLONE ACETONIDE 0.1 % EX OINT: 1.0000 "application " | TOPICAL_OINTMENT | Freq: Two times a day (BID) | CUTANEOUS | Status: DC

## 2010-12-08 NOTE — Patient Instructions (Signed)
Please return to clinic in 4-5 months. Labs 2 weeks prior to visit.

## 2010-12-12 ENCOUNTER — Inpatient Hospital Stay (HOSPITAL_COMMUNITY)
Admission: AD | Admit: 2010-12-12 | Discharge: 2011-01-03 | DRG: 330 | Disposition: A | Discharge status: Home Health | Payer: Medicare Other | Source: Other Acute Inpatient Hospital | Attending: Internal Medicine | Admitting: Internal Medicine

## 2010-12-12 ENCOUNTER — Encounter: Payer: Self-pay | Admitting: Internal Medicine

## 2010-12-12 ENCOUNTER — Encounter: Payer: Self-pay | Admitting: *Deleted

## 2010-12-12 DIAGNOSIS — E870 Hyperosmolality and hypernatremia: Secondary | ICD-10-CM | POA: Diagnosis not present

## 2010-12-12 DIAGNOSIS — J40 Bronchitis, not specified as acute or chronic: Secondary | ICD-10-CM | POA: Diagnosis present

## 2010-12-12 DIAGNOSIS — Z860100 Personal history of colon polyps, unspecified: Secondary | ICD-10-CM

## 2010-12-12 DIAGNOSIS — Z79899 Other long term (current) drug therapy: Secondary | ICD-10-CM

## 2010-12-12 DIAGNOSIS — IMO0002 Reserved for concepts with insufficient information to code with codable children: Secondary | ICD-10-CM

## 2010-12-12 DIAGNOSIS — F17200 Nicotine dependence, unspecified, uncomplicated: Secondary | ICD-10-CM | POA: Diagnosis present

## 2010-12-12 DIAGNOSIS — K5730 Diverticulosis of large intestine without perforation or abscess without bleeding: Secondary | ICD-10-CM | POA: Diagnosis present

## 2010-12-12 DIAGNOSIS — K565 Intestinal adhesions [bands], unspecified as to partial versus complete obstruction: Principal | ICD-10-CM | POA: Diagnosis present

## 2010-12-12 DIAGNOSIS — G894 Chronic pain syndrome: Secondary | ICD-10-CM | POA: Diagnosis present

## 2010-12-12 DIAGNOSIS — E876 Hypokalemia: Secondary | ICD-10-CM | POA: Diagnosis present

## 2010-12-12 DIAGNOSIS — I739 Peripheral vascular disease, unspecified: Secondary | ICD-10-CM | POA: Diagnosis present

## 2010-12-12 DIAGNOSIS — M129 Arthropathy, unspecified: Secondary | ICD-10-CM | POA: Diagnosis present

## 2010-12-12 DIAGNOSIS — F411 Generalized anxiety disorder: Secondary | ICD-10-CM | POA: Diagnosis present

## 2010-12-12 DIAGNOSIS — G609 Hereditary and idiopathic neuropathy, unspecified: Secondary | ICD-10-CM | POA: Diagnosis present

## 2010-12-12 DIAGNOSIS — Z21 Asymptomatic human immunodeficiency virus [HIV] infection status: Secondary | ICD-10-CM | POA: Diagnosis present

## 2010-12-12 DIAGNOSIS — I10 Essential (primary) hypertension: Secondary | ICD-10-CM | POA: Diagnosis present

## 2010-12-12 DIAGNOSIS — E785 Hyperlipidemia, unspecified: Secondary | ICD-10-CM | POA: Diagnosis present

## 2010-12-12 DIAGNOSIS — K560 Paralytic ileus: Secondary | ICD-10-CM | POA: Diagnosis not present

## 2010-12-12 DIAGNOSIS — I7300 Raynaud's syndrome without gangrene: Secondary | ICD-10-CM | POA: Diagnosis present

## 2010-12-12 DIAGNOSIS — H409 Unspecified glaucoma: Secondary | ICD-10-CM | POA: Diagnosis present

## 2010-12-12 DIAGNOSIS — R51 Headache: Secondary | ICD-10-CM | POA: Diagnosis not present

## 2010-12-12 LAB — COMPREHENSIVE METABOLIC PANEL
Albumin: 4 g/dL (ref 3.5–5.2)
BUN: 10 mg/dL (ref 6–23)
Creatinine, Ser: 0.51 mg/dL (ref 0.50–1.10)
GFR calc Af Amer: >90 mL/min (ref >90)
Glucose, Bld: 86 mg/dL (ref 70–99)
Total Protein: 8.3 g/dL (ref 6.0–8.3)

## 2010-12-12 LAB — LIPASE, BLOOD: Lipase: 30 U/L (ref 11–59)

## 2010-12-12 LAB — DIFFERENTIAL
Eosinophils Relative: 0 % (ref 0–5)
Lymphocytes Relative: 34 % (ref 12–46)
Lymphs Abs: 3.2 10*3/uL (ref 0.7–4.0)
Monocytes Absolute: 0.5 10*3/uL (ref 0.1–1.0)
Monocytes Relative: 5 % (ref 3–12)

## 2010-12-12 LAB — CBC
HCT: 39.6 % (ref 36.0–46.0)
MCH: 33.6 pg (ref 26.0–34.0)
MCHC: 36.1 g/dL — ABNORMAL HIGH (ref 30.0–36.0)
MCV: 93 fL (ref 78.0–100.0)
RDW: 12.3 % (ref 11.5–15.5)

## 2010-12-12 LAB — AMYLASE: Amylase: 94 U/L (ref 0–105)

## 2010-12-12 NOTE — Telephone Encounter (Signed)
rxes printed and available for pick up.

## 2010-12-12 NOTE — Progress Notes (Signed)
Jessica [name]'s daughter came by the clinic today and said Jessica [name] had been in [facility] since Saturday with severe pain, nausea and vomiting. She said they thought her pancreas was inflamed and were going to run tests today. She may want to be transferred to [facility]. Informed Dr. Maurice March of her condition.

## 2010-12-12 NOTE — H&P (Addendum)
Hospital Admission Note Date: 12/12/2010  Patient name: Jessica [name] Medical record number: [id number] Date of birth: 1940/11/24 Age: 70 y.o. Gender: female PCP: [name], MD, MD  Medical Service: B 2  Attending physician:  Dr. Maurice March  Pager: Resident (R2/R3):  Dr. Dorthula Rue  Pager: [phone] Resident (R1):  Dr. Dorise Hiss  Pager:[phone]  Chief Complaint: abdominal pain  History of Present Illness: Pt is a 70 YO women who presented to [facility] ED on Saturday and was evaluated for abdominal pain. She states the pain started on Friday evening suddenly and caused her to double over. She has had multiple episodes of vomiting since then although denies any blood in the vomitus. She states that the pain was constant and colicky in nature with cramplike symptoms. She was treated in the ED and returned the same day and was admitted to [facility] for further workup. Amylase and lipase were not significantly elevated at that time and CT was shown to be fairly benign with some diverticulosis however not specifically acute diverticulitis. The pt was afebrile through that course and requested transfer to [place] today before she could be evaluated by GI. She has still had vomiting and nausea although states she has only thrown up twice today. She is having nausea and ate a little bit of breakfast today. Less appetite. She last had a BM on Saturday and states that it is normal for her to only have 1 per week. She is not having any SOB, chest pain, leg swelling, fever, chills, diarrhea.   Meds: Xanax 1 mg TID prn Norco 7.5/325 mg q 6 hr prn pain Atripla 600/200/300 mgPO daily Flexeril TID prn Fentanyl 50 mcg q 3 days Lovastatin 40 mg  Benazepril 20 mg    Allergies: Review of patient's allergies indicates no known allergies.  Past Medical History  Diagnosis Date  . Glaucoma   . Arthritis   . Hypertension   . HIV infection Last CD4 ~1000  . Hyperlipidemia    Past Surgical History    Procedure Date  . Eye surgery cholecystectomy   . Abdominal hysterectomy   . Femoral endarterectomy   . Aorto-femoral bypass graft    PFH: None known  History   Social History  . Marital Status: Widowed    Spouse Name: N/A    Number of Children: N/A  . Years of Education: N/A   Occupational History  . Not on file.   Social History Main Topics  . Smoking status: Current Everyday Smoker -- 0.5 packs/day for 50 years    Types: Cigarettes  . Smokeless tobacco: Not on file  . Alcohol Use: Not on file  . Drug Use: Not on file  . Sexually Active: Not on file   Other Topics Concern  . Not on file   Social History Narrative  . No narrative on file    Review of Systems: Pertinent items are noted in HPI.  Physical Exam: Vitals: T:  99.8F HR: 66  BP: 193/66  RR: 22  O2 saturation:  General: resting in bed HEENT: PERRL, EOMI, no scleral icterus Cardiac: RRR, no rubs, murmurs or gallops Pulm: clear to auscultation bilaterally, moving normal volumes of air Abd: soft, diffusely tender to palpation, more tender along the midline and lower in the abdomen, nondistended, BS present Ext: warm and well perfused, no pedal edema Neuro: alert and oriented X3, cranial nerves II-XII grossly intact, strength and sensation to light touch equal in bilateral upper and lower extremities  DRE: hard  stool, non-bloody appearing, no hemorrhoids, sent for hemoccult  Lab results: Basic Metabolic Panel: Na 137 K 3.4 Cl 107 Bicarb 21.0 BUN: 13 Cr 0.64 Glu: 98 Ca 8.9  CBC: WBC: 6.4 Hg: 13.0 Platelets: 273  LFTs: tot bili 0.4 Alk Phos 72 AST 31 ALT 18 Total protein 7.5 Albumin 3.8  Imaging results:  CT abd/pelvis: nothing acute, appendix normal, kidneys normal, diverticulosis noted no acute diverticulitis apparent.     Assessment & Plan by Problem: 1. Abdominal pain - will keep pt NPO except for meds, zofran for nausea. Currently afebrile, no antibiotics at this time. Add  pepcid for possible gastric irritation. Pain is resolving since Friday. Will continue to monitor regarding pancreatitis vs diverticulitis vs gastritis. Will check CBC and CMET, lipase, U/A to r/o UTI possible exacerbating abdominal pain. NS @ 75 cc/hr to hydrate. DRE sent for hemoccult sample.   2. HIV + - last CD4 1167, on atripla. Will continue.  3. Hyperlipidemia - on lovastatin, continue.  4. Chronic pain - on fentanyl patch and norco chronically, will continue. Will add miralax for her chronic constipation.  5. HTN - continue pt's out pt medications of benazepril. Will watch BP and monitor and make any adjustments as needed.  6. DVT ppx - lovenox 40 mg [place] daily.   R2/3______________________________      R1________________________________  ATTENDING: I performed and/or observed a history and physical examination of the patient.  I discussed the case with the residents as noted and reviewed the residents' notes.  I agree with the findings and plan--please refer to the attending physician note for more details.  Signature________________________________  Printed Name_____________________________

## 2010-12-13 DIAGNOSIS — R109 Unspecified abdominal pain: Secondary | ICD-10-CM

## 2010-12-13 LAB — URINALYSIS, MICROSCOPIC ONLY
Glucose, UA: NEGATIVE mg/dL
Ketones, ur: >80 mg/dL — AB
Nitrite: NEGATIVE
Protein, ur: NEGATIVE mg/dL

## 2010-12-13 LAB — LIPID PANEL
HDL: 75 mg/dL (ref >39)
LDL Cholesterol: 36 mg/dL (ref 0–99)
Triglycerides: 67 mg/dL (ref <150)

## 2010-12-14 ENCOUNTER — Inpatient Hospital Stay (HOSPITAL_COMMUNITY): Payer: Medicare Other

## 2010-12-15 ENCOUNTER — Inpatient Hospital Stay (HOSPITAL_COMMUNITY): Payer: Medicare Other

## 2010-12-15 DIAGNOSIS — R109 Unspecified abdominal pain: Secondary | ICD-10-CM

## 2010-12-15 LAB — DIFFERENTIAL
Basophils Absolute: 0 10*3/uL (ref 0.0–0.1)
Lymphocytes Relative: 28 % (ref 12–46)
Monocytes Absolute: 0.5 10*3/uL (ref 0.1–1.0)
Neutro Abs: 5.4 10*3/uL (ref 1.7–7.7)
Neutrophils Relative %: 66 % (ref 43–77)

## 2010-12-15 LAB — CBC
HCT: 37.1 % (ref 36.0–46.0)
Hemoglobin: 13.4 g/dL (ref 12.0–15.0)
MCHC: 36.1 g/dL — ABNORMAL HIGH (ref 30.0–36.0)
RBC: 4.03 MIL/uL (ref 3.87–5.11)
WBC: 8.3 10*3/uL (ref 4.0–10.5)

## 2010-12-15 LAB — CARDIAC PANEL(CRET KIN+CKTOT+MB+TROPI): Troponin I: <0.3 ng/mL (ref <0.30)

## 2010-12-16 LAB — BASIC METABOLIC PANEL
BUN: 15 mg/dL (ref 6–23)
CO2: 20 mEq/L (ref 19–32)
Chloride: 105 mEq/L (ref 96–112)
Glucose, Bld: 99 mg/dL (ref 70–99)
Potassium: 2.9 mEq/L — ABNORMAL LOW (ref 3.5–5.1)

## 2010-12-17 ENCOUNTER — Inpatient Hospital Stay (HOSPITAL_COMMUNITY): Payer: Medicare Other

## 2010-12-17 DIAGNOSIS — K56609 Unspecified intestinal obstruction, unspecified as to partial versus complete obstruction: Secondary | ICD-10-CM

## 2010-12-17 DIAGNOSIS — R109 Unspecified abdominal pain: Secondary | ICD-10-CM

## 2010-12-17 LAB — BASIC METABOLIC PANEL
BUN: 16 mg/dL (ref 6–23)
CO2: 15 mEq/L — ABNORMAL LOW (ref 19–32)
Chloride: 106 mEq/L (ref 96–112)
Creatinine, Ser: <0.47 mg/dL — ABNORMAL LOW (ref 0.50–1.10)
Potassium: 3 mEq/L — ABNORMAL LOW (ref 3.5–5.1)

## 2010-12-17 LAB — CBC
HCT: 39.7 % (ref 36.0–46.0)
Hemoglobin: 14.7 g/dL (ref 12.0–15.0)
MCHC: 37 g/dL — ABNORMAL HIGH (ref 30.0–36.0)
MCV: 92.1 fL (ref 78.0–100.0)
RDW: 12.9 % (ref 11.5–15.5)

## 2010-12-17 MED ORDER — IOHEXOL 300 MG/ML  SOLN
100.0000 mL | Freq: Once | INTRAMUSCULAR | Status: AC | PRN
  Administered 2010-12-17: 100 mL via INTRAVENOUS

## 2010-12-18 ENCOUNTER — Inpatient Hospital Stay (HOSPITAL_COMMUNITY): Payer: Medicare Other

## 2010-12-18 LAB — BASIC METABOLIC PANEL
BUN: 16 mg/dL (ref 6–23)
CO2: 17 mEq/L — ABNORMAL LOW (ref 19–32)
Calcium: 9.1 mg/dL (ref 8.4–10.5)
Chloride: 111 mEq/L (ref 96–112)
Creatinine, Ser: <0.47 mg/dL — ABNORMAL LOW (ref 0.50–1.10)

## 2010-12-18 LAB — CBC
HCT: 35.8 % — ABNORMAL LOW (ref 36.0–46.0)
MCH: 32.7 pg (ref 26.0–34.0)
MCV: 91.6 fL (ref 78.0–100.0)
Platelets: 285 10*3/uL (ref 150–400)
RBC: 3.91 MIL/uL (ref 3.87–5.11)
RDW: 13.2 % (ref 11.5–15.5)

## 2010-12-18 LAB — MAGNESIUM: Magnesium: 2 mg/dL (ref 1.5–2.5)

## 2010-12-19 ENCOUNTER — Inpatient Hospital Stay (HOSPITAL_COMMUNITY): Payer: Medicare Other

## 2010-12-19 DIAGNOSIS — R109 Unspecified abdominal pain: Secondary | ICD-10-CM

## 2010-12-19 LAB — BASIC METABOLIC PANEL
BUN: 13 mg/dL (ref 6–23)
BUN: 12 mg/dL (ref 6–23)
CO2: 19 mEq/L (ref 19–32)
CO2: 17 mEq/L — ABNORMAL LOW (ref 19–32)
Calcium: 9.7 mg/dL (ref 8.4–10.5)
Calcium: 9.4 mg/dL (ref 8.4–10.5)
Chloride: 112 mEq/L (ref 96–112)
Chloride: 113 mEq/L — ABNORMAL HIGH (ref 96–112)
Creatinine, Ser: 0.6 mg/dL (ref 0.50–1.10)
Creatinine, Ser: <0.47 mg/dL — ABNORMAL LOW (ref 0.50–1.10)
Glucose, Bld: 67 mg/dL — ABNORMAL LOW (ref 70–99)
Glucose, Bld: 65 mg/dL — ABNORMAL LOW (ref 70–99)

## 2010-12-19 LAB — CBC
HCT: 33.3 % — ABNORMAL LOW (ref 36.0–46.0)
MCH: 33.3 pg (ref 26.0–34.0)
MCV: 93.3 fL (ref 78.0–100.0)
Platelets: 294 10*3/uL (ref 150–400)
RBC: 3.57 MIL/uL — ABNORMAL LOW (ref 3.87–5.11)
WBC: 12.7 10*3/uL — ABNORMAL HIGH (ref 4.0–10.5)

## 2010-12-20 LAB — BASIC METABOLIC PANEL
Calcium: 9.8 mg/dL (ref 8.4–10.5)
Creatinine, Ser: <0.47 mg/dL — ABNORMAL LOW (ref 0.50–1.10)
Glucose, Bld: 69 mg/dL — ABNORMAL LOW (ref 70–99)
Sodium: 146 mEq/L — ABNORMAL HIGH (ref 135–145)

## 2010-12-21 DIAGNOSIS — R109 Unspecified abdominal pain: Secondary | ICD-10-CM

## 2010-12-21 LAB — CBC
MCH: 32.7 pg (ref 26.0–34.0)
MCV: 93.6 fL (ref 78.0–100.0)
Platelets: 289 10*3/uL (ref 150–400)
RBC: 3.3 MIL/uL — ABNORMAL LOW (ref 3.87–5.11)
RDW: 13.6 % (ref 11.5–15.5)
WBC: 6.9 10*3/uL (ref 4.0–10.5)

## 2010-12-21 LAB — COMPREHENSIVE METABOLIC PANEL
ALT: 17 U/L (ref 0–35)
AST: 19 U/L (ref 0–37)
Albumin: 2.7 g/dL — ABNORMAL LOW (ref 3.5–5.2)
CO2: 15 mEq/L — ABNORMAL LOW (ref 19–32)
Calcium: 9.2 mg/dL (ref 8.4–10.5)
Creatinine, Ser: <0.47 mg/dL — ABNORMAL LOW (ref 0.50–1.10)
Sodium: 147 mEq/L — ABNORMAL HIGH (ref 135–145)

## 2010-12-22 LAB — BASIC METABOLIC PANEL
BUN: 7 mg/dL (ref 6–23)
CO2: 17 mEq/L — ABNORMAL LOW (ref 19–32)
Chloride: 115 mEq/L — ABNORMAL HIGH (ref 96–112)
Glucose, Bld: 68 mg/dL — ABNORMAL LOW (ref 70–99)
Potassium: 3.5 mEq/L (ref 3.5–5.1)
Sodium: 150 mEq/L — ABNORMAL HIGH (ref 135–145)

## 2010-12-23 LAB — BASIC METABOLIC PANEL
BUN: 6 mg/dL (ref 6–23)
CO2: 22 mEq/L (ref 19–32)
Chloride: 114 mEq/L — ABNORMAL HIGH (ref 96–112)
Creatinine, Ser: <0.47 mg/dL — ABNORMAL LOW (ref 0.50–1.10)
Glucose, Bld: 138 mg/dL — ABNORMAL HIGH (ref 70–99)
Potassium: 3.7 mEq/L (ref 3.5–5.1)

## 2010-12-24 ENCOUNTER — Inpatient Hospital Stay (HOSPITAL_COMMUNITY): Payer: Medicare Other

## 2010-12-24 DIAGNOSIS — R109 Unspecified abdominal pain: Secondary | ICD-10-CM

## 2010-12-24 LAB — BASIC METABOLIC PANEL
BUN: 4 mg/dL — ABNORMAL LOW (ref 6–23)
Creatinine, Ser: <0.47 mg/dL — ABNORMAL LOW (ref 0.50–1.10)
Glucose, Bld: 105 mg/dL — ABNORMAL HIGH (ref 70–99)
Potassium: 4.4 mEq/L (ref 3.5–5.1)

## 2010-12-24 LAB — CBC
HCT: 36.1 % (ref 36.0–46.0)
Hemoglobin: 12.5 g/dL (ref 12.0–15.0)
MCH: 32.9 pg (ref 26.0–34.0)
MCHC: 34.6 g/dL (ref 30.0–36.0)
MCV: 95 fL (ref 78.0–100.0)
RDW: 13.7 % (ref 11.5–15.5)

## 2010-12-25 ENCOUNTER — Encounter (INDEPENDENT_AMBULATORY_CARE_PROVIDER_SITE_OTHER): Payer: Self-pay | Admitting: Surgery

## 2010-12-25 ENCOUNTER — Other Ambulatory Visit (INDEPENDENT_AMBULATORY_CARE_PROVIDER_SITE_OTHER): Payer: Self-pay | Admitting: Surgery

## 2010-12-25 DIAGNOSIS — K565 Intestinal adhesions [bands], unspecified as to partial versus complete obstruction: Secondary | ICD-10-CM

## 2010-12-25 DIAGNOSIS — K56609 Unspecified intestinal obstruction, unspecified as to partial versus complete obstruction: Secondary | ICD-10-CM | POA: Insufficient documentation

## 2010-12-25 DIAGNOSIS — K55059 Acute (reversible) ischemia of intestine, part and extent unspecified: Secondary | ICD-10-CM

## 2010-12-25 LAB — BASIC METABOLIC PANEL
BUN: 3 mg/dL — ABNORMAL LOW (ref 6–23)
CO2: 26 mEq/L (ref 19–32)
Calcium: 9.7 mg/dL (ref 8.4–10.5)
Chloride: 107 mEq/L (ref 96–112)
Creatinine, Ser: 0.55 mg/dL (ref 0.50–1.10)
GFR calc Af Amer: >90 mL/min (ref >90)

## 2010-12-26 DIAGNOSIS — R109 Unspecified abdominal pain: Secondary | ICD-10-CM

## 2010-12-26 LAB — CBC
HCT: 43.3 % (ref 36.0–46.0)
Hemoglobin: 15.2 g/dL — ABNORMAL HIGH (ref 12.0–15.0)
MCH: 32.5 pg (ref 26.0–34.0)
MCHC: 35.1 g/dL (ref 30.0–36.0)
RDW: 13.2 % (ref 11.5–15.5)

## 2010-12-26 LAB — COMPREHENSIVE METABOLIC PANEL
BUN: 6 mg/dL (ref 6–23)
Calcium: 8.8 mg/dL (ref 8.4–10.5)
GFR calc Af Amer: >90 mL/min (ref >90)
Glucose, Bld: 153 mg/dL — ABNORMAL HIGH (ref 70–99)
Total Protein: 5.6 g/dL — ABNORMAL LOW (ref 6.0–8.3)

## 2010-12-26 LAB — PHOSPHORUS: Phosphorus: 4.4 mg/dL (ref 2.3–4.6)

## 2010-12-26 LAB — PREALBUMIN: Prealbumin: 13 mg/dL — ABNORMAL LOW (ref 17.0–34.0)

## 2010-12-26 LAB — MAGNESIUM: Magnesium: 1.2 mg/dL — ABNORMAL LOW (ref 1.5–2.5)

## 2010-12-26 LAB — TRIGLYCERIDES: Triglycerides: 38 mg/dL (ref <150)

## 2010-12-26 NOTE — Op Note (Signed)
NAME:  Jessica [name], Jessica [name]       ACCOUNT NO.:  [id number]  MEDICAL RECORD NO.:  [id number]  LOCATION:  3310                         FACILITY:  [facility]  PHYSICIAN:  Currie Paris, M.D.DATE OF BIRTH:  1940/12/29  DATE OF PROCEDURE:  12/25/2010 DATE OF DISCHARGE:                              OPERATIVE REPORT   PREOPERATIVE DIAGNOSIS:  Small-bowel obstructions secondary to adhesions.  POSTOPERATIVE DIAGNOSIS:  Small-bowel obstructions secondary to adhesions - closed loop.  PROCEDURE:  Exploratory laparotomy with lysis of adhesions and small- bowel resection with primary anastomosis.  SURGEON:  Currie Paris, MD  ASSISTANT:  Mary Sella. Andrey Campanile, MD  ANESTHESIA:  General endotracheal.  CLINICAL HISTORY:  This is a 70 year old lady with multiple prior operations including AAA repair who presented with what looked like a initially closed-loop small bowel obstruction, but seemed to get better with some NG suction, although we were unable to ever get her to a point where she did not have continued high output out the NG suction, although her KUB looked fairly normal.  I thought that this was because her small-bowel obstruction was fairly proximal and we were able to keep her decompressed with the NG tube and at this point, I thought she has had long enough trial of nonoperative management.  I went over the procedure with the patient, including risks, complications, and expectations for success, and she had no further questions.  She agreed to proceed to surgery.  DESCRIPTION OF PROCEDURE:  I saw the patient in the holding area.  She had no further questions.  We confirmed the plans as noted above.  The patient was taken to the operating room and after satisfactory general endotracheal anesthesia had been obtained, a Foley catheter was placed and the abdomen was prepped and draped.  The time-out was done.  I started by making an incision at the umbilicus going inferiorly  and found some old Prolene or Novafil sutures, which I cut and removed.  I opened the fascia and was able to get a small window into the peroneal cavity and see there were no significant adhesions to the anterior abdominal wall, so opened the fascia to the length of my incision and took down some omental adhesions.  All of small bowel that I could initially see was decompressed.  I started bringing that out and freeing it up where it was stuck to the omentum until I could start working more proximally.  After we did so, I then found a large loop of small bowel that was markedly dilated and I could feel an area of fibrosis and induration.  I thought that was the area of the obstruction, but I could not really visualize it.  I therefore extended the incisions superiorly.  There were multiple loops of small bowel attached to the omentum that were decompressed going up into this area and then 2 or 3 loops of very dilated bowel.  As I began taking these down, I could see that there was a loop where the obstruction was complete and as I divided into that we got into the bowel trying to free this up, so I divided the bowel at this point.I think there was a dense stricture at this point. I then  freed up all the adhesions proximal to that and this area was not too far from the ligament of Treitz perhaps about 2 feet.  There was a second small area where the bowel had been entered as that was also caught as a knuckle into this main inflammatory area.  Once I had all freed up, I elected to resect a short segment of the jejunum here and do a primary anastomosis to get rid of both of the main area where the obstruction was and I think was really scarred down and then also the second area.  I divided the mesentery with the ligature.  Once that was up, I aligned any mesenteric borders of the small bowel tacked in together.  I opened the any mesenteric border, put in the GIA and fired it.  The staple  line was dry.  I closed the common defect with the TA stapler and then resected the excess bowel.  I put some sutures in the crotch to reinforce that staple line.  We did spill some enteric content, but there was no evidence of infection.  We then ran the entire small bowel, freed up all of the adhesions, and there were multiple more adhesions that needed to be freed up so that we had everything freed up prior to closing.  We changed gloves, we spent about 4 to 5 L of saline to irrigate, make sure there was no residual contamination.  I then rechecked the anastomosis and it appeared healthy and viable and no tension.  It was going to line to the left upper quadrant nicely.  I then put some Seprafilm into the pelvis where the small bowel is going to lie way away from the anastomosis, but to keep it from sticking out too badly to the pelvis and then put some more on the small bowel in the lower midline, cover that with the omentum and some Seprafilm on that to try to reduce postoperative adhesions.  The fascia was then closed with a running looped 0-PDS with some interrupted 0-Prolene.  I irrigated the skin, loosely approximated with a couple staples, and then packed it.  The patient tolerated procedure well.  There were no operative complications.  All counts were correct.  ESTIMATED BLOOD LOSS:  Less than 100 mL.     Currie Paris, M.D.     CJS/MEDQ  D:  12/25/2010  T:  12/26/2010  Job:  [id number]  Electronically Signed by Cyndia Bent M.D. on 12/26/2010 05:36:58 AM

## 2010-12-27 LAB — COMPREHENSIVE METABOLIC PANEL
AST: 13 U/L (ref 0–37)
Albumin: 1.8 g/dL — ABNORMAL LOW (ref 3.5–5.2)
Chloride: 104 mEq/L (ref 96–112)
Creatinine, Ser: <0.47 mg/dL — ABNORMAL LOW (ref 0.50–1.10)
Potassium: 3.9 mEq/L (ref 3.5–5.1)
Sodium: 134 mEq/L — ABNORMAL LOW (ref 135–145)
Total Bilirubin: 0.2 mg/dL — ABNORMAL LOW (ref 0.3–1.2)

## 2010-12-27 LAB — GLUCOSE, CAPILLARY
Glucose-Capillary: 151 mg/dL — ABNORMAL HIGH (ref 70–99)
Glucose-Capillary: 137 mg/dL — ABNORMAL HIGH (ref 70–99)

## 2010-12-27 LAB — CBC
MCV: 93.4 fL (ref 78.0–100.0)
Platelets: 230 10*3/uL (ref 150–400)
RDW: 13.5 % (ref 11.5–15.5)
WBC: 10.6 10*3/uL — ABNORMAL HIGH (ref 4.0–10.5)

## 2010-12-27 LAB — DIFFERENTIAL
Basophils Absolute: 0 10*3/uL (ref 0.0–0.1)
Basophils Relative: 0 % (ref 0–1)
Eosinophils Absolute: 0.1 10*3/uL (ref 0.0–0.7)
Eosinophils Relative: 1 % (ref 0–5)

## 2010-12-27 LAB — PHOSPHORUS: Phosphorus: 2.6 mg/dL (ref 2.3–4.6)

## 2010-12-27 NOTE — Consult Note (Signed)
NAME:  Jessica [name], Jessica [name] NO.:  [id number]  MEDICAL RECORD NO.:  [id number]  LOCATION:  5152                         FACILITY:  [facility]  PHYSICIAN:  Almond Lint, MD       DATE OF BIRTH:  1940-06-10  DATE OF CONSULTATION: DATE OF DISCHARGE:                                CONSULTATION   REQUESTING PROVIDER:  Cliffton Asters, MD of the Teaching Service.  CHIEF COMPLAINT:  Abdominal pain, partial small bowel obstruction.  HISTORY OF PRESENT ILLNESS:  [name] is a 70 year old female, who has been having intermittent pain for several months.  She had a colonoscopy earlier this year, which demonstrated diverticulosis and a small diminutive polyp.  This was performed for this pain and for chronic constipation.  Over the last 7 days, she has had exacerbation of her pain and went to [facility] around a week ago.  She describes the pain as crampy, intermittent.  At times, that would go completely away.  A CT scan performed there was not revealing the cause of pain. Her amylase and lipase were slightly elevated.  The scan does not show diverticulitis of pancreatitis.  She had occasional nausea and vomiting, but not a significant quantity.  She frequently only has one bowel movement a week, so does not think that was odd that she had a bowel movement only once in the last week.  She denied fevers, chills, diarrhea, abdominal pain.  Since being in the hospital, she has continued to have episodic abdominal pain.  Some days have been worse than the other days.  Today, she had become much more distended and plain films and CT scan were repeated.  The CT scan today was concerning for a possible closed loop obstruction.  At this point, an NG tube was placed and 5300 mL of bilious fluid were obtained.  Since that time, she felt significantly better, although not 100% better.  She did have a small bowel movement today with a suppository and had two bowel movements  on December 15, 2010, but she has not had a large bowel movement for around a week before that.  Her past medical history is significant for: 1. Glaucoma. 2. Arthritis. 3. Hypertension. 4. HIV. 5. Hyperlipidemia.  PAST SURGICAL HISTORY:  She has had a cholecystectomy, abdominal hysterectomy, femoral endarterectomy, aortofemoral bypass graft, eye surgery.  The aortobifemoral graft was performed in 2009 by Dr. Hart Rochester. Also, the common femoral endarterectomy was performed at that time as well.  She has seen Vascular Surgery this year and had good blood flow to her bilateral lower extremities.  MEDICATIONS AT HOME:  Includes Xanax, Norco, Atripla, Flexeril, fentanyl, lovastatin, and benazepril.  Here in the hospital, she is on Pepcid, Flexeril, Zestril/Prinivil, lovastatin, Atripla, fentanyl patch, prophylactic Lovenox, MiraLax, Colace, Senokot, p.r.n. Lopressor, potassium, p.r.n. Dilaudid and Phenergan.  FAMILY HISTORY:  She is not aware of any cancer, strokes, or other severe illnesses in her family.  SOCIAL HISTORY:  She is widowed.  She is a current smoker.  REVIEW OF SYSTEMS:  Otherwise negative other than the HPI x11 systems.  PHYSICAL EXAMINATION:  VITAL SIGNS:  Temperature max is 98.6, pulse was documented 116 at 6 o'clock, but this has come down to  around 100 after placement of her NG tube, respiratory rate 25, blood pressure 149/72, sats 95% on room air. GENERAL:  She is sleeping, but arousable.  Once awakened, she is alert and oriented x3, in no acute distress.  She does not want to move her head due to the presence of the NG tube.  She does appear quite thin. NECK:  Supple without lymphadenopathy.  No thyromegaly.  Trachea is midline.  Mucous membranes are moist. HEART:  Regular.  No murmurs. LUNGS:  Clear. ABDOMEN:  Soft, mildly distended, focal tenderness in the left abdomen. No rebound and no involuntary guarding.   EXTREMITIES:  Warm and well perfused.  There  is no pitting edema. PSYCHIATRIC:  Mood and affect are normal.  Gait is not assessed.  She has no gross motor or sensory deficits.  LABORATORY DATA:  Labs drawn today include a sodium of 140, potassium of 3, chloride 106, CO2 of 15, glucose 88, BUN 16, creatinine less than 0.47.  CBC was not performed today, the last one we have is from December 15, 2010 with a white count of 8.3, hemoglobin 13.4, hematocrit 37.1, and platelet count 266,000.  Films are described above.  There is no formal read yet on the CT, but the images are reviewed.  ASSESSMENT AND PLAN:  [name] is a 70 year old female with a probable small bowel obstruction.  She does have decompressed loops and dilated loops as well as dilated stomach on her CT scan.  Given the significant quantity eval from the NG tube and the clinical improvement since that time, I think it is reasonable to give her a trial for NG tube decompression.  I think her chronic constipation most likely is exacerbating this problem.  Right now, I would leave her n.p.o. with the NG tube in and see how a trial of bowel rest helps her. She has been taking in food up until yesterday morning, so I think that she does have a reasonable chance of getting by if this is an adhesive bowel obstruction with nonoperative therapy.  I would be however careful for signs of worsening as I think she is high risk to need a surgical intervention sometime in the next few days.  We will continue to follow. I am going to order lactate, do repeat CBC, and then repeat labs and x- ray in the morning.     Almond Lint, MD     FB/MEDQ  D:  12/17/2010  T:  12/17/2010  Job:  [id number]  Electronically Signed by Almond Lint MD on 12/27/2010 08:15:13 PM

## 2010-12-28 DIAGNOSIS — R109 Unspecified abdominal pain: Secondary | ICD-10-CM

## 2010-12-28 LAB — COMPREHENSIVE METABOLIC PANEL
AST: 10 U/L (ref 0–37)
Albumin: 1.8 g/dL — ABNORMAL LOW (ref 3.5–5.2)
BUN: 10 mg/dL (ref 6–23)
CO2: 25 mEq/L (ref 19–32)
Calcium: 8.3 mg/dL — ABNORMAL LOW (ref 8.4–10.5)
Creatinine, Ser: 0.41 mg/dL — ABNORMAL LOW (ref 0.50–1.10)
GFR calc non Af Amer: >90 mL/min (ref >90)

## 2010-12-28 LAB — GLUCOSE, CAPILLARY
Glucose-Capillary: 146 mg/dL — ABNORMAL HIGH (ref 70–99)
Glucose-Capillary: 122 mg/dL — ABNORMAL HIGH (ref 70–99)
Glucose-Capillary: 137 mg/dL — ABNORMAL HIGH (ref 70–99)
Glucose-Capillary: 141 mg/dL — ABNORMAL HIGH (ref 70–99)
Glucose-Capillary: 141 mg/dL — ABNORMAL HIGH (ref 70–99)

## 2010-12-28 LAB — MAGNESIUM: Magnesium: 1.6 mg/dL (ref 1.5–2.5)

## 2010-12-28 LAB — URINALYSIS, ROUTINE W REFLEX MICROSCOPIC
Bilirubin Urine: NEGATIVE
Glucose, UA: NEGATIVE mg/dL
Hgb urine dipstick: NEGATIVE
Ketones, ur: NEGATIVE mg/dL
Protein, ur: NEGATIVE mg/dL
Urobilinogen, UA: 0.2 mg/dL (ref 0.0–1.0)

## 2010-12-28 LAB — PHOSPHORUS: Phosphorus: 2.6 mg/dL (ref 2.3–4.6)

## 2010-12-29 LAB — BASIC METABOLIC PANEL
CO2: 24 mEq/L (ref 19–32)
Chloride: 99 mEq/L (ref 96–112)
GFR calc Af Amer: >90 mL/min (ref >90)
Potassium: 4 mEq/L (ref 3.5–5.1)

## 2010-12-29 LAB — GLUCOSE, CAPILLARY
Glucose-Capillary: 111 mg/dL — ABNORMAL HIGH (ref 70–99)
Glucose-Capillary: 127 mg/dL — ABNORMAL HIGH (ref 70–99)
Glucose-Capillary: 140 mg/dL — ABNORMAL HIGH (ref 70–99)
Glucose-Capillary: 150 mg/dL — ABNORMAL HIGH (ref 70–99)
Glucose-Capillary: 152 mg/dL — ABNORMAL HIGH (ref 70–99)

## 2010-12-29 LAB — CBC
HCT: 27.8 % — ABNORMAL LOW (ref 36.0–46.0)
Hemoglobin: 9.7 g/dL — ABNORMAL LOW (ref 12.0–15.0)
MCH: 32.4 pg (ref 26.0–34.0)
MCV: 93 fL (ref 78.0–100.0)
RBC: 2.99 MIL/uL — ABNORMAL LOW (ref 3.87–5.11)
WBC: 8.8 10*3/uL (ref 4.0–10.5)

## 2010-12-29 LAB — COMPREHENSIVE METABOLIC PANEL
ALT: 15 U/L (ref 0–35)
AST: 13 U/L (ref 0–37)
Albumin: 1.8 g/dL — ABNORMAL LOW (ref 3.5–5.2)
CO2: 24 mEq/L (ref 19–32)
Chloride: 102 mEq/L (ref 96–112)
Creatinine, Ser: 0.36 mg/dL — ABNORMAL LOW (ref 0.50–1.10)
GFR calc non Af Amer: >90 mL/min (ref >90)
Sodium: 135 mEq/L (ref 135–145)
Total Bilirubin: 0.2 mg/dL — ABNORMAL LOW (ref 0.3–1.2)

## 2010-12-29 LAB — URINE CULTURE
Colony Count: NO GROWTH
Culture  Setup Time: 201210180146

## 2010-12-29 LAB — PHOSPHORUS: Phosphorus: 3.4 mg/dL (ref 2.3–4.6)

## 2010-12-30 DIAGNOSIS — R109 Unspecified abdominal pain: Secondary | ICD-10-CM

## 2010-12-30 LAB — GLUCOSE, CAPILLARY
Glucose-Capillary: 124 mg/dL — ABNORMAL HIGH (ref 70–99)
Glucose-Capillary: 132 mg/dL — ABNORMAL HIGH (ref 70–99)

## 2010-12-30 LAB — BASIC METABOLIC PANEL
Chloride: 98 mEq/L (ref 96–112)
Creatinine, Ser: 0.37 mg/dL — ABNORMAL LOW (ref 0.50–1.10)
GFR calc Af Amer: >90 mL/min (ref >90)
GFR calc non Af Amer: >90 mL/min (ref >90)
Potassium: 4 mEq/L (ref 3.5–5.1)

## 2010-12-31 LAB — BASIC METABOLIC PANEL
Calcium: 8.9 mg/dL (ref 8.4–10.5)
Creatinine, Ser: 0.38 mg/dL — ABNORMAL LOW (ref 0.50–1.10)
GFR calc Af Amer: >90 mL/min (ref >90)
GFR calc non Af Amer: >90 mL/min (ref >90)
Sodium: 134 mEq/L — ABNORMAL LOW (ref 135–145)

## 2010-12-31 LAB — DIFFERENTIAL
Basophils Absolute: 0 10*3/uL (ref 0.0–0.1)
Basophils Relative: 1 % (ref 0–1)
Eosinophils Absolute: 0.2 10*3/uL (ref 0.0–0.7)
Eosinophils Relative: 2 % (ref 0–5)
Lymphocytes Relative: 22 % (ref 12–46)
Monocytes Absolute: 0.6 10*3/uL (ref 0.1–1.0)

## 2010-12-31 LAB — GLUCOSE, CAPILLARY
Glucose-Capillary: 129 mg/dL — ABNORMAL HIGH (ref 70–99)
Glucose-Capillary: 128 mg/dL — ABNORMAL HIGH (ref 70–99)

## 2010-12-31 LAB — CBC
HCT: 25.7 % — ABNORMAL LOW (ref 36.0–46.0)
MCHC: 34.6 g/dL (ref 30.0–36.0)
Platelets: 290 10*3/uL (ref 150–400)
RDW: 12.9 % (ref 11.5–15.5)
WBC: 8.8 10*3/uL (ref 4.0–10.5)

## 2011-01-01 LAB — BASIC METABOLIC PANEL
Calcium: 9.3 mg/dL (ref 8.4–10.5)
GFR calc Af Amer: >90 mL/min (ref >90)
GFR calc non Af Amer: >90 mL/min (ref >90)
Glucose, Bld: 129 mg/dL — ABNORMAL HIGH (ref 70–99)
Potassium: 4 mEq/L (ref 3.5–5.1)
Sodium: 134 mEq/L — ABNORMAL LOW (ref 135–145)

## 2011-01-02 DIAGNOSIS — R109 Unspecified abdominal pain: Secondary | ICD-10-CM

## 2011-01-02 LAB — COMPREHENSIVE METABOLIC PANEL
ALT: 93 U/L — ABNORMAL HIGH (ref 0–35)
Albumin: 2 g/dL — ABNORMAL LOW (ref 3.5–5.2)
Alkaline Phosphatase: 168 U/L — ABNORMAL HIGH (ref 39–117)
BUN: 17 mg/dL (ref 6–23)
Calcium: 9.2 mg/dL (ref 8.4–10.5)
Glucose, Bld: 124 mg/dL — ABNORMAL HIGH (ref 70–99)
Potassium: 3.8 mEq/L (ref 3.5–5.1)
Sodium: 136 mEq/L (ref 135–145)
Total Protein: 6.7 g/dL (ref 6.0–8.3)

## 2011-01-02 LAB — DIFFERENTIAL
Basophils Absolute: 0 10*3/uL (ref 0.0–0.1)
Eosinophils Relative: 2 % (ref 0–5)
Lymphocytes Relative: 26 % (ref 12–46)
Lymphs Abs: 1.8 10*3/uL (ref 0.7–4.0)
Monocytes Absolute: 0.4 10*3/uL (ref 0.1–1.0)
Monocytes Relative: 6 % (ref 3–12)

## 2011-01-02 LAB — CBC
HCT: 23.2 % — ABNORMAL LOW (ref 36.0–46.0)
MCH: 32.5 pg (ref 26.0–34.0)
MCHC: 34.5 g/dL (ref 30.0–36.0)
MCV: 94.3 fL (ref 78.0–100.0)
RDW: 13.4 % (ref 11.5–15.5)

## 2011-01-02 LAB — PHOSPHORUS: Phosphorus: 4.2 mg/dL (ref 2.3–4.6)

## 2011-01-03 LAB — BASIC METABOLIC PANEL
BUN: 13 mg/dL (ref 6–23)
Calcium: 9.2 mg/dL (ref 8.4–10.5)
Creatinine, Ser: 0.53 mg/dL (ref 0.50–1.10)
GFR calc non Af Amer: >90 mL/min (ref >90)
Glucose, Bld: 85 mg/dL (ref 70–99)

## 2011-01-04 ENCOUNTER — Other Ambulatory Visit: Payer: Self-pay | Admitting: *Deleted

## 2011-01-06 ENCOUNTER — Telehealth: Payer: Self-pay | Admitting: Licensed Clinical Social Worker

## 2011-01-06 NOTE — Telephone Encounter (Signed)
Home health nurse called wanting a verbal order to give this patient a tub seat. Verbal order was given.

## 2011-01-11 ENCOUNTER — Telehealth (INDEPENDENT_AMBULATORY_CARE_PROVIDER_SITE_OTHER): Payer: Self-pay | Admitting: General Surgery

## 2011-01-11 NOTE — Telephone Encounter (Signed)
CAROL WITH [facility] CALLED TO REPORT THAT LOWER END OF INCISION HAS OPENED SLIGHTLY AND IS DRAINING FLUID. I ADVISED THAT PT BE SEEN TODAY IN OFFICE. CAROL SAID PT DOES NOT HAVE TRANSPORTATION TODAY. SHE DOES HAS F/U APPT WITH DR. Jamey Ripa ON 01-12-11. NO FEVER NAUSEA OR VOMITING REPORTED. TEMP REPORTED AS 99. AADVISED TO CHANGE DRESSING AS NEEDED. TO CALL IF SYMPTOMS CHANGE.

## 2011-01-12 ENCOUNTER — Ambulatory Visit (INDEPENDENT_AMBULATORY_CARE_PROVIDER_SITE_OTHER): Payer: Medicare Other | Admitting: Surgery

## 2011-01-12 ENCOUNTER — Ambulatory Visit (INDEPENDENT_AMBULATORY_CARE_PROVIDER_SITE_OTHER): Payer: Medicare Other | Admitting: Infectious Diseases

## 2011-01-12 ENCOUNTER — Other Ambulatory Visit (INDEPENDENT_AMBULATORY_CARE_PROVIDER_SITE_OTHER): Payer: Self-pay | Admitting: Surgery

## 2011-01-12 VITALS — BP 106/60 | HR 100 | Temp 97.6°F | Resp 12 | Ht 68.0 in | Wt 111.0 lb

## 2011-01-12 VITALS — BP 96/64 | HR 103 | Temp 97.8°F | Wt 108.1 lb

## 2011-01-12 DIAGNOSIS — T8140XA Infection following a procedure, unspecified, initial encounter: Secondary | ICD-10-CM

## 2011-01-12 DIAGNOSIS — T148XXA Other injury of unspecified body region, initial encounter: Secondary | ICD-10-CM

## 2011-01-12 DIAGNOSIS — L089 Local infection of the skin and subcutaneous tissue, unspecified: Secondary | ICD-10-CM

## 2011-01-12 DIAGNOSIS — T8149XA Infection following a procedure, other surgical site, initial encounter: Secondary | ICD-10-CM

## 2011-01-12 HISTORY — PX: COLECTOMY: SHX59

## 2011-01-12 LAB — CBC WITH DIFFERENTIAL/PLATELET
Basophils Relative: 0 % (ref 0–1)
Eosinophils Absolute: 0 10*3/uL (ref 0.0–0.7)
Eosinophils Relative: 0 % (ref 0–5)
MCH: 31.5 pg (ref 26.0–34.0)
MCHC: 34.4 g/dL (ref 30.0–36.0)
Neutrophils Relative %: 68 % (ref 43–77)
Platelets: 666 10*3/uL — ABNORMAL HIGH (ref 150–400)

## 2011-01-12 MED ORDER — MOXIFLOXACIN HCL 400 MG PO TABS: 400.0000 mg | ORAL_TABLET | Freq: Every day | ORAL | Status: AC

## 2011-01-12 NOTE — Patient Instructions (Signed)
Start antibiioti - one daily - tonight ans see me tomorrow

## 2011-01-12 NOTE — Progress Notes (Signed)
Chief complaint: Postop check was draining wound.  History of present illness: This patient underwent an exploratory laparotomy with small bowel resection for a closed loop small bowel obstruction. The surgery was almost 3 weeks ago. Her wound was left open and packed and gradually healed. She has been home for several days. She's not eating well but eating some. Her bowels are working.  Exam General: The patient is alert but appears somewhat tired and fatigue. Abdomen: Soft and generally benign. There is copious brownish drainage from the bottom of her incision. There is not an obvious fistula-type drainage. I think this is a superficial wound infection or perhaps involving the fascia. I recommended to her that we opened it further.  Impression: Status post small bowel resection for bowel obstruction with apparent wound infection possible fistula. However no peritoneal signs. Plan: We'll go ahead and open this up today.  Procedure note: I anesthetized the area with 1% Xylocaine with epinephrine. I then opened the incision for several centimeters superiorly. The draining area was at the very inferior edge of the incision. A small abscess cavity was found and there some necrotic fascia. The fascia however appears basically intact.  A culture was taken and the wound packed with 2 x 2's.  I spoke with Dr. Maurice March and we are going to start her on Avelox 400 mg daily. I will need to recheck her wound tomorrow.

## 2011-01-13 ENCOUNTER — Ambulatory Visit (INDEPENDENT_AMBULATORY_CARE_PROVIDER_SITE_OTHER): Payer: Medicare Other | Admitting: Surgery

## 2011-01-13 VITALS — Temp 96.8°F | Ht 68.0 in | Wt 111.0 lb

## 2011-01-13 DIAGNOSIS — T148XXA Other injury of unspecified body region, initial encounter: Secondary | ICD-10-CM

## 2011-01-13 DIAGNOSIS — L089 Local infection of the skin and subcutaneous tissue, unspecified: Secondary | ICD-10-CM

## 2011-01-13 NOTE — Patient Instructions (Signed)
You need to have home health care do daily wet dressing changes to the incision. We will need to have you come back to the office next week for a nurse to check the incision. If you had any problems come back and see [name] for call.

## 2011-01-13 NOTE — Progress Notes (Signed)
Chief complaint: Followup of wound opening  History of present illness this patient underwent a laparotomy for bowel obstruction with a bowel resection a few weeks ago. Her wound is packed open but possibly were closed up. She did develop drainage which I opened yesterday in the office and cultured she comes back for a recheck today. She is having less pain in the incision. She has not had any fevers. She started her antibiotics as were prescribed yesterday.  Exam: The abdomen is soft and benign. The open area of the wound looks clean and top aspect there is some mild necrotic material at the base that I think we'll clean up with wound dressing changes. There is not appear to be a fistula.  Impression: Wound infection improving  Plan: Daily dressing changes with followup by our nurse next week. Antibiotics can be adjusted depending on the culture.

## 2011-01-14 NOTE — Discharge Summary (Signed)
NAME:  Jessica [name], Jessica [name]       ACCOUNT NO.:  [id number]  MEDICAL RECORD NO.:  [id number]  LOCATION:  5155                         FACILITY:  [facility]  PHYSICIAN:  Doneen Poisson, MD     DATE OF BIRTH:  1940/06/27  DATE OF ADMISSION:  12/12/2010 DATE OF DISCHARGE:  01/03/2011                              DISCHARGE SUMMARY   DISCHARGE DIAGNOSES: 1. Small-bowel obstruction secondary to adhesions from previous     surgery status post exploratory laparotomy with lysis of adhesions     and small bowel resection with primary anastomosis. 2. 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 3. Hypertension. 4. Hyperlipidemia. 5. Tobacco abuse. 6. Chronic pain syndrome. 7. History of abdominal hysterectomy. 8. History of aortofemoral bypass surgery.  DISCHARGE MEDICATIONS WITH ACCURATE DOSES: 1. Colace 100 mg, take 1 tablet by mouth twice daily. 2. Atripla, take 1 tablet by mouth daily. 3. Benazepril/hydrochlorothiazide 20/12.5, take 1 tablet by mouth     twice daily. 4. Multivitamins, take 1 tablet by mouth daily. 5. Fentanyl patch 50 mcg, apply 1 patch transdermally every 3 days. 6. Flexeril 10 mg, take 1 tablet by mouth 3 times daily. 7. Hydrocodone/acetaminophen 7.5/25, take 1 tablet by mouth every 6     hours as needed for pain. 8. Lovastatin 40 mg, take 1 tablet by mouth daily. 9. Nicotine 14-mg patch, apply 1 patch every 24 hours. 10.Triamcinolone topical cream, apply the cream daily as needed. 11.Vitamin D3, take 1 tablet by mouth twice daily. 12.Xanax 1 mg, take 1 tablet by mouth 3 times a day as needed.  DISPOSITION AND FOLLOW-UP:  Jessica [name] was discharged home in  stable and improved condition from [facility] on January 03, 2011.  She has been scheduled a follow up with Dr. [facility] on January 12, 2011.  She has been also advised to follow up with [facility]  Surgery with Dr. Jamey Ripa in 7-10 days after her discharge.  At that time, her site for exploratory laparotomy needs to be evaluated for appropriate healing.  We may also consider rechecking her BMET as she had some problems with hypokalemia during her hospital admission.  Arrangements for home health PT/OT were made at the time of discharge.  PROCEDURES PERFORMED: 1. Acute abdominal series on December 14, 2010, which showed possible     left lower lobe heterogeneous opacity which may represent     infection.  Moderate-to-large colonic stool burden without definite     evidence of obstruction. 2. Chest x-ray on December 15, 2010, which showed minimal atelectasis     and small effusion at left lung base. 3. Abdominal series on December 17, 2010, which showed fluid gas level     in the left upper abdomen, but difficult entirely to exclude free     air.  Nonspecific calcifications in the medial right abdomen,     mildly distended nonspecific loop of bowel within the mid abdomen. 4. Acute abdominal series on December 18, 2010, which showed significant     reduction in gastric and proximal small bowel distention. 5. CT abdomen and pelvis on December 17, 2010, which  showed marked     destruction of stomach and duodenum with a transition point near     the midline just anterior to the aortic repair.  There is also free     fluid in the left paracolic gutter and pelvis.  No evidence of     ischemia. 6. Acute abdominal series on December 24, 2010, which showed stomach     remains distended with air and fluid despite presence of NG tube. 7. Exploratory laparotomy with lysis of adhesions and small-bowel     resection with primary anastomosis on December 25, 2010.  CONSULTATION:  Surgery for small bowel obstruction.  BRIEF ADMITTING HISTORY AND PHYSICAL AND HISTORY OF PRESENT ILLNESS: Jessica [name] is a 70 year old woman who presented to the [facility] ER on Saturday and was evaluated for  abdominal pain.  She states  that the pain started on Friday evening suddenly and caused her to double  over.  She had multiple episodes of vomiting since then, although denies  any blood in the vomitus.  She states that the pain was constant, colicky  in nature with cramp-like symptoms.  She was treated in the ER and  returned the same day and was admitted to [facility] for further workup.   Amylase and lipase were not significantly elevated at that time.  CT was  shown to be fairly benign with some diverticulosis, however, not acute diverticulitis.  She was afebrile throughout the course and requested transfer to [facility] today before she could be evaluated by GI.  She noted continued vomiting and nausea although states that she has only thrown up twice a day.  She is still having nausea and ate a little bit of breakfast on the day of admission.  She endorses less appetite. She had a bowel movement on Saturday and that was normal.  She is not having any shortness of breath, chest pain, leg swelling, fever, chills, or diarrhea.  PHYSICAL EXAMINATION: VITAL SIGNS:  Temperature 99.1, heart rate 66, blood pressure 193/66,  respiratory rate 22, O2 sat 98% on room air. GENERAL:  No acute distress.  Resting in the bed HEENT:  Pupils equal, round, and reactive to light.  Extraocular movements intact.  No scleral icterus. CARDIAC:  Regular rate and rhythm.  No murmurs, rubs, or gallops. LUNGS:  Clear to auscultation bilaterally, moving normal volumes of air. ABDOMEN:  Soft, diffusely tender to palpation, more tender along the midline and lower in the abdomen.  Nondistended.  Bowel sounds present. EXTREMITIES:  Warm and well perfused.  No pedal edema. NEUROLOGIC:  Alert and oriented x 3.  Cranial nerves II-XII intact. Strength and sensation intact to light touch bilaterally in both upper and lower extremities.  LABS:  Sodium 137, potassium 3.4, chloride 107, bicarb 21, BUN 13,  creatinine  0.64, glucose 98, calcium 8.9.  White blood cell count 6.4,  hemoglobin 13, platelets 273.  T bili 0.4, alk phos 72, AST 31, ALT 18,  total protein 7.5, albumin 3.8.  HOSPITAL COURSE BY PROBLEM: 1. Small bowel obstruction likley secondary to adhesions from previous     surgeries.  Jessica [name] was initially transferred     from the [facility] with suspicion for pancreatitis in the     setting of mildly elevated amylase and lipase. There was no      evidence of pancraetitis on the CT scan. She slowly developed a      clearer picture of a small bowel obstruction, confirmed by X-  rays      and CT scan on day 5-6 of hospitalization.  A surgery consult was      placed and she was tried on conservative measures first which      included bowel rest and  an NG tube for decompression for approximately      7 days.  When the NG tube was initially placed, she had 5 L of      output and then subsequently over a period of  7 days, she continued      to have significant outputs from her NG tube without much clinical      improvement.  In the setting of no clinical improvement, on day 14/15      of hospitalization, she had an exploratory laparotomy with lysis of      adhesions and small bowel resection with primary anastomosis on December 25, 2010.  Afterwards she had very slow clinical improvement.  She was      placed on bowel rest for 5 days and on day 4 postop, had some flatus and      started moving her bowels.  On post-op day 6 her NG tube was clamped      and eventually pulled out.  Her diet was slowly advanced from day 6     postop and at the time of discharge, she was tolerating her diet well      without any evidence of nausea and vomiting and regualr bowel movements.       She was also placed on TPN to provide additional nutritional support and      a dilaudid PCA pump post-operatively.  Arrangements for home health PT/OT     were made at the time of discharge.  2.  Hypokalemia.  Jessica [name] had severe hypokalemia due to increased     output from the NG tube.  She continued to receive potassium     supplementation which kept her potassium within normal limits.  3. Hypertension.  Initially, her blood pressure medications were held when      she was n.p.o.  When she resumed her diet, her blood pressure medications      were restarted and her blood pressure stabilized.  4. HIV.  Ms. Mariel Craft HIV medications were held when she was made     n.p.o., but were restarted  when she resumed her diet.  5. Tobacco abuse. She was placed on nicotine patch.  DISCHARGE VITALS: Temperature 99, pulse 73, respiratory rate 18, blood pressure 120/58, O2 sats  99% on room air.  DISCHARGE LABS: Sodium 135, potassium 4.8, chloride 102, bicarb 25, glucose 85, BUN 13,  creatinine 0.53, calcium 9.2.  White blood cell count 6.8, hemoglobin 8,  hematocrit 23.3, platelets 395.   ______________________________ Elyse Jarvis, MD   ______________________________ Doneen Poisson, MD   MS/MEDQ  D:  01/03/2011  T:  01/03/2011  Job:  [id number]  cc:   Fransisco Hertz, M.D.  Electronically Signed by Elyse Jarvis MD on 01/08/2011 07:30:09 PM Electronically Signed by Doneen Poisson  on 01/14/2011 06:22:44 PM

## 2011-01-16 LAB — WOUND CULTURE: Gram Stain: NONE SEEN

## 2011-01-17 ENCOUNTER — Telehealth: Payer: Self-pay | Admitting: *Deleted

## 2011-01-17 NOTE — Telephone Encounter (Signed)
Okey Regal, a nurse with [facility] called to report low bps she is getting on this pt. Range is 100/50. Pt feels dizzy. She is moving slow & carefully. She has already taken the BP meds for today. She takes Lotensin/HCT. She has an appt with the surgeon tomorrow. Appetite good. Drinking well.being treated with antibiotics. . No fevers.  States the wound still looks infected but states pt does not look septic. Her # is [phone].  I spoke with Dr. Maurice March. He instructed her to stop all antihypertensives. Will get report from surgeon tomorrow after her visit.

## 2011-01-18 ENCOUNTER — Ambulatory Visit (INDEPENDENT_AMBULATORY_CARE_PROVIDER_SITE_OTHER): Payer: Medicare Other | Admitting: General Surgery

## 2011-01-18 DIAGNOSIS — IMO0001 Reserved for inherently not codable concepts without codable children: Secondary | ICD-10-CM

## 2011-01-18 DIAGNOSIS — Z4800 Encounter for change or removal of nonsurgical wound dressing: Secondary | ICD-10-CM

## 2011-01-18 NOTE — Patient Instructions (Signed)
Dr Jamey Ripa will see you next week. I will address the changes your home health nurse is proposing with him on Friday. We will continue to do what we are doing at the moment. Call us if anything changes prior to your appt next week.

## 2011-01-18 NOTE — Progress Notes (Signed)
Patient comes in for an abdominal wound check. I removed packing. Wound looks good. Minimal amount of slough at bottom of wound. No odor. Wound is mainly beefy red and healing well. Patient states wound is a little sore but no other complaints. There are two black sutures I can see at the bottom of the wound bed. We received a note from Aleda Grana, RN with [facility]. She asked whether we could switch to calcium alginate with silver on Monday, Wednesdays and Fridays instead of wet to dry dressing changes every day. I advised I would discuss with Dr Jamey Ripa on Friday 01/20/11 when he returns to the office but to continue what we are doing now and call if she has any problems. I made a follow up appt for the patient on Tuesday 01/24/11 for follow up with Dr Jamey Ripa.

## 2011-01-20 ENCOUNTER — Telehealth (INDEPENDENT_AMBULATORY_CARE_PROVIDER_SITE_OTHER): Payer: Self-pay | Admitting: General Surgery

## 2011-01-20 NOTE — Telephone Encounter (Signed)
Called to [facility] a change in patient's wound care orders. Ok per Dr Jamey Ripa to switch to Calcium Alginate Silver x 3 days a week instead of wet to dry dressing changes. Called to [place] office, spoke with Elisa.

## 2011-01-24 ENCOUNTER — Encounter (INDEPENDENT_AMBULATORY_CARE_PROVIDER_SITE_OTHER): Payer: Self-pay | Admitting: Surgery

## 2011-01-24 ENCOUNTER — Ambulatory Visit (INDEPENDENT_AMBULATORY_CARE_PROVIDER_SITE_OTHER): Payer: Medicare Other | Admitting: Surgery

## 2011-01-24 VITALS — BP 142/68 | HR 72 | Temp 97.4°F | Resp 16 | Ht 68.0 in | Wt 125.6 lb

## 2011-01-24 DIAGNOSIS — Z9889 Other specified postprocedural states: Secondary | ICD-10-CM

## 2011-01-24 NOTE — Progress Notes (Signed)
Chief complaint: Followup of wound opening  History of present illness this patient underwent a laparotomy for bowel obstruction with a bowel resection a few weeks ago. She developed a wound infection, and come for follow up. She thinks she is doing well. She is eating well and her bowels are moving "like clockwork." Exam: The abdomen is soft and benign. The open area of the wound looks clean and healing nicely. A few hypertrophic granulations.  Impression: Wound infection improving  Plan: Daily dressing changes RTC three weeks

## 2011-01-24 NOTE — Patient Instructions (Signed)
Continue dressing changes as you have been doing and see me again in about three weeks

## 2011-01-26 ENCOUNTER — Encounter: Payer: Self-pay | Admitting: Infectious Diseases

## 2011-01-26 ENCOUNTER — Other Ambulatory Visit: Payer: Self-pay | Admitting: *Deleted

## 2011-01-26 ENCOUNTER — Ambulatory Visit (INDEPENDENT_AMBULATORY_CARE_PROVIDER_SITE_OTHER): Payer: Medicare Other | Admitting: Infectious Diseases

## 2011-01-26 VITALS — BP 210/93 | HR 107 | Temp 97.9°F | Wt 128.0 lb

## 2011-01-26 DIAGNOSIS — B20 Human immunodeficiency virus [HIV] disease: Secondary | ICD-10-CM

## 2011-01-26 DIAGNOSIS — M5126 Other intervertebral disc displacement, lumbar region: Secondary | ICD-10-CM

## 2011-01-26 DIAGNOSIS — G894 Chronic pain syndrome: Secondary | ICD-10-CM

## 2011-01-26 MED ORDER — HYDROCODONE-ACETAMINOPHEN 7.5-325 MG PO TABS: 1.0000 | ORAL_TABLET | Freq: Four times a day (QID) | ORAL | Status: DC | PRN

## 2011-01-26 MED ORDER — FENTANYL 50 MCG/HR TD PT72: 1.0000 | MEDICATED_PATCH | TRANSDERMAL | Status: DC

## 2011-01-26 NOTE — Progress Notes (Signed)
  Subjective:    Patient ID: Jessica [name], female    DOB: [birth year], 70 y.o.   MRN: [id number]  HPISarah is feeling much better and her surgical sight infection is improving with decreasing drainage. She has completed two weeks of moxifloxacin. She is eating well and has gained 10 lbs since he previous visit. She is faithful with her ARVs and has no other complaints.     Review of Systems     Objective:   Physical Exam  In no distress and alert. Abdomen is flat and soft and midline wound is healing well. She has followup with Dr. Jamey [name] next week.    Assessment & Plan:  Resolving SSI and better nutrition. HIV stable and will f/u in two months and monitor her HIV therapy.

## 2011-01-26 NOTE — Patient Instructions (Addendum)
Please return in 6 weeks to see Dr. Maurice March Labs 2 weeks prior

## 2011-02-14 ENCOUNTER — Ambulatory Visit (INDEPENDENT_AMBULATORY_CARE_PROVIDER_SITE_OTHER): Payer: Medicare Other | Admitting: *Deleted

## 2011-02-14 ENCOUNTER — Ambulatory Visit (INDEPENDENT_AMBULATORY_CARE_PROVIDER_SITE_OTHER): Payer: Medicare Other | Admitting: Surgery

## 2011-02-14 ENCOUNTER — Encounter (INDEPENDENT_AMBULATORY_CARE_PROVIDER_SITE_OTHER): Payer: Self-pay | Admitting: Surgery

## 2011-02-14 VITALS — BP 175/67 | HR 105 | Temp 97.8°F | Wt 127.8 lb

## 2011-02-14 DIAGNOSIS — T148XXA Other injury of unspecified body region, initial encounter: Secondary | ICD-10-CM

## 2011-02-14 DIAGNOSIS — E785 Hyperlipidemia, unspecified: Secondary | ICD-10-CM

## 2011-02-14 DIAGNOSIS — L089 Local infection of the skin and subcutaneous tissue, unspecified: Secondary | ICD-10-CM

## 2011-02-14 DIAGNOSIS — B20 Human immunodeficiency virus [HIV] disease: Secondary | ICD-10-CM

## 2011-02-14 LAB — LIPID PANEL
Cholesterol: 168 mg/dL (ref 0–200)
VLDL: 17 mg/dL (ref 0–40)

## 2011-02-14 LAB — COMPREHENSIVE METABOLIC PANEL
AST: 18 U/L (ref 0–37)
Albumin: 4.3 g/dL (ref 3.5–5.2)
Alkaline Phosphatase: 69 U/L (ref 39–117)
BUN: 17 mg/dL (ref 6–23)
Calcium: 9.3 mg/dL (ref 8.4–10.5)
Creat: 0.98 mg/dL (ref 0.50–1.10)
Glucose, Bld: 99 mg/dL (ref 70–99)

## 2011-02-14 NOTE — Progress Notes (Signed)
Chief complaint: Followup of wound opening  History of present illness this patient underwent a laparotomy for bowel obstruction with a bowel resection a few weeks ago. She developed a wound infection, and come for follow up. She thinks she is doing well. She is eating well and her bowels are moving "like clockwork." Exam: The abdomen is soft and benign. The open area of the wound looks clean and healing nicely. A few hypertrophic granulations.  Impression: Wound infection improving  Plan: Daily dressing changes RTC three weeks 

## 2011-02-15 LAB — CBC WITH DIFFERENTIAL/PLATELET
Eosinophils Absolute: 0.1 10*3/uL (ref 0.0–0.7)
HCT: 36.5 % (ref 36.0–46.0)
Hemoglobin: 11.7 g/dL — ABNORMAL LOW (ref 12.0–15.0)
Lymphs Abs: 5.2 10*3/uL — ABNORMAL HIGH (ref 0.7–4.0)
MCH: 31.5 pg (ref 26.0–34.0)
Monocytes Relative: 5 % (ref 3–12)
Neutro Abs: 2.6 10*3/uL (ref 1.7–7.7)
Neutrophils Relative %: 32 % — ABNORMAL LOW (ref 43–77)
RBC: 3.71 MIL/uL — ABNORMAL LOW (ref 3.87–5.11)

## 2011-02-16 ENCOUNTER — Other Ambulatory Visit: Payer: Self-pay | Admitting: *Deleted

## 2011-02-16 DIAGNOSIS — M5126 Other intervertebral disc displacement, lumbar region: Secondary | ICD-10-CM

## 2011-02-16 MED ORDER — CYCLOBENZAPRINE HCL 10 MG PO TABS: 10.0000 mg | ORAL_TABLET | Freq: Three times a day (TID) | ORAL | Status: DC | PRN

## 2011-02-17 NOTE — Progress Notes (Signed)
Patient here for her week 784 ALLRT study visit. She is doing much better now after her surgery to remove adhesions. She had developed a SBO which caused her a lot of discomfort. She was in the hospital for almost 3 weeks. She saw her surgeon today who released her from care. Her abdominal incision had been draining a little, but has finally closed up and looks normal. She only has a little bit of abd discomfort. Her bowels are slowly coming back to normal and she is taking colace every day. She continues to use fentanyl patches for joint pain and an occasional vicodin. She will return in April for the next ALLRT study visit.

## 2011-03-15 ENCOUNTER — Other Ambulatory Visit: Payer: Self-pay | Admitting: *Deleted

## 2011-03-15 DIAGNOSIS — M5126 Other intervertebral disc displacement, lumbar region: Secondary | ICD-10-CM

## 2011-03-15 DIAGNOSIS — G894 Chronic pain syndrome: Secondary | ICD-10-CM

## 2011-03-15 MED ORDER — HYDROCODONE-ACETAMINOPHEN 7.5-325 MG PO TABS: 1.0000 | ORAL_TABLET | Freq: Four times a day (QID) | ORAL | Status: DC | PRN

## 2011-03-15 MED ORDER — FENTANYL 50 MCG/HR TD PT72: 1.0000 | MEDICATED_PATCH | TRANSDERMAL | Status: DC

## 2011-03-15 NOTE — Telephone Encounter (Signed)
Daughter Con Memos is in clinic to pick up her mother's norco [facility]) confirmed with pt that this was ok.

## 2011-03-15 NOTE — Telephone Encounter (Signed)
Verified with pharmacy that last fill was done 02/09/11. Gave dtr both fentanyl & norco today

## 2011-03-16 ENCOUNTER — Ambulatory Visit (INDEPENDENT_AMBULATORY_CARE_PROVIDER_SITE_OTHER): Payer: Medicare Other | Admitting: Infectious Diseases

## 2011-03-16 ENCOUNTER — Encounter: Payer: Self-pay | Admitting: Infectious Diseases

## 2011-03-16 ENCOUNTER — Ambulatory Visit: Payer: Medicare Other

## 2011-03-16 VITALS — BP 159/89 | HR 104 | Temp 98.3°F | Ht 68.0 in | Wt 130.5 lb

## 2011-03-16 DIAGNOSIS — Z21 Asymptomatic human immunodeficiency virus [HIV] infection status: Secondary | ICD-10-CM

## 2011-03-16 DIAGNOSIS — G894 Chronic pain syndrome: Secondary | ICD-10-CM | POA: Diagnosis not present

## 2011-03-16 DIAGNOSIS — B20 Human immunodeficiency virus [HIV] disease: Secondary | ICD-10-CM | POA: Diagnosis not present

## 2011-03-16 DIAGNOSIS — Z113 Encounter for screening for infections with a predominantly sexual mode of transmission: Secondary | ICD-10-CM | POA: Diagnosis not present

## 2011-03-16 MED ORDER — HYDROCODONE-ACETAMINOPHEN 7.5-500 MG PO TABS: 1.0000 | ORAL_TABLET | Freq: Four times a day (QID) | ORAL | Status: DC | PRN

## 2011-03-16 NOTE — Progress Notes (Signed)
Jessica [name] is here for routine f/u of her HIV and other problems. She feels well today and has regained most of her weight after her small bowel obstruction complicated by surgical site infection. 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 During most of this illness in October she continued her ARVs and maintained suppressed HIV and CD4 counts>1000. Her other chronic problems are stable. 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 BP 159/89  Pulse 104  Temp(Src) 98.3 F (36.8 C) (Oral)  Ht 5\' 8"  (1.727 m)  Wt 130 lb 8 oz (59.194 kg)  BMI 19.84 kg/m2 No distress. NWG:NFAO with normal bowel sounds. No rashes. No adenopathy. Impression:Bowel obstruction and s/p SSI recovered and doing well.                    HIV supressed                     HTN controlled. Continue present meds, HIV quant and CD4 today                     Next appointment in 3-4 months. [name]

## 2011-03-17 LAB — T-HELPER CELL (CD4) - (RCID CLINIC ONLY)
CD4 % Helper T Cell: 37 % (ref 33–55)
CD4 T Cell Abs: 1720 uL (ref 400–2700)

## 2011-03-20 LAB — HIV-1 RNA QUANT-NO REFLEX-BLD: HIV-1 RNA Quant, Log: <1.3 {Log} (ref <1.30)

## 2011-04-07 ENCOUNTER — Other Ambulatory Visit: Payer: Self-pay | Admitting: *Deleted

## 2011-04-07 NOTE — Telephone Encounter (Signed)
Patient called requesting her pain medications which are not due until 04/15/11.  She wanted to pick them up on Wednesday 04/12/11 when she comes to the dental clinic.  Dr. Maurice March will not be here until 04/13/11 and can have him sign the RXs that day.  Asked her to remind [name] when she comes to the dental clinic. Wendall Mola CMA

## 2011-04-10 ENCOUNTER — Telehealth: Payer: Self-pay | Admitting: Licensed Clinical Social Worker

## 2011-04-10 ENCOUNTER — Other Ambulatory Visit: Payer: Self-pay | Admitting: *Deleted

## 2011-04-10 DIAGNOSIS — G894 Chronic pain syndrome: Secondary | ICD-10-CM

## 2011-04-10 DIAGNOSIS — M5126 Other intervertebral disc displacement, lumbar region: Secondary | ICD-10-CM

## 2011-04-10 MED ORDER — FENTANYL 50 MCG/HR TD PT72: 1.0000 | MEDICATED_PATCH | TRANSDERMAL | Status: DC

## 2011-04-10 MED ORDER — HYDROCODONE-ACETAMINOPHEN 10-325 MG PO TABS: 1.0000 | ORAL_TABLET | Freq: Four times a day (QID) | ORAL | Status: DC | PRN

## 2011-04-10 NOTE — Telephone Encounter (Signed)
Per Dr. Maurice March he wanted to increase Norco to 10-325 instead of 7.5-500. Medication was not listed on her chart after it was reordered last time. I added it back and printed it so Dr. Daiva Eves can sign it, because she will be here on Wednesday.

## 2011-04-12 ENCOUNTER — Encounter: Payer: Self-pay | Admitting: *Deleted

## 2011-04-25 NOTE — Assessment & Plan Note (Signed)
Currently taking fentanyl 50 mcg per hour, along with Vicodin 7.5/500 every 6 hours when necessary breakthrough pain. Having good results with this regimen, and not requesting, dosage adjustments. We'll continue present management, and have her followup with Dr. Maurice March at the next available date.

## 2011-04-25 NOTE — Progress Notes (Signed)
Patient ID: Jessica [name], female   DOB: 29-Jan-1941, 71 y.o.   MRN: [id number] Presents for a brief followup after adjusting dosage of fentanyl, and her breakthrough pain medication. Showed log of the frequency of need for breakthrough medication, which was only 3 times in one month. She therefore, feels as though the current dosage is appropriate, does not cause her any symptoms and gives her adequate control.  Chronic pain syndrome Currently taking fentanyl 50 mcg per hour, along with Vicodin 7.5/500 every 6 hours when necessary breakthrough pain. Having good results with this regimen, and not requesting, dosage adjustments. We'll continue present management, and have her followup with Dr. Maurice March at the next available date.

## 2011-04-26 ENCOUNTER — Other Ambulatory Visit: Payer: Self-pay | Admitting: Licensed Clinical Social Worker

## 2011-04-26 ENCOUNTER — Telehealth: Payer: Self-pay | Admitting: Licensed Clinical Social Worker

## 2011-04-26 DIAGNOSIS — M5126 Other intervertebral disc displacement, lumbar region: Secondary | ICD-10-CM

## 2011-04-26 MED ORDER — HYDROCODONE-ACETAMINOPHEN 10-325 MG PO TABS: 1.0000 | ORAL_TABLET | Freq: Four times a day (QID) | ORAL | Status: DC | PRN

## 2011-04-26 NOTE — Telephone Encounter (Signed)
Patient was given 30 quantity of Norco 10-325 on 04/12/2011 per patient it should have been 60. I called Dr. Maurice March and he verified that was correct and said it was ok to write for another 30. I also verified with the pharmacy that it was only 30 and that she could fill another 30 and they agreed.

## 2011-05-09 ENCOUNTER — Other Ambulatory Visit: Payer: Self-pay | Admitting: Licensed Clinical Social Worker

## 2011-05-09 DIAGNOSIS — G894 Chronic pain syndrome: Secondary | ICD-10-CM

## 2011-05-09 DIAGNOSIS — B20 Human immunodeficiency virus [HIV] disease: Secondary | ICD-10-CM

## 2011-05-09 DIAGNOSIS — M5126 Other intervertebral disc displacement, lumbar region: Secondary | ICD-10-CM

## 2011-05-09 MED ORDER — HYDROCODONE-ACETAMINOPHEN 10-325 MG PO TABS: 1.0000 | ORAL_TABLET | Freq: Four times a day (QID) | ORAL | Status: DC | PRN

## 2011-05-09 MED ORDER — ENSURE PO LIQD: 237.0000 mL | Freq: Every day | ORAL | Status: DC

## 2011-05-09 MED ORDER — FENTANYL 50 MCG/HR TD PT72: 1.0000 | MEDICATED_PATCH | TRANSDERMAL | Status: DC

## 2011-05-11 ENCOUNTER — Telehealth: Payer: Self-pay | Admitting: Licensed Clinical Social Worker

## 2011-05-11 NOTE — Telephone Encounter (Signed)
I called the patient to let her know that Dr. Drue Second was able to sign her script since Dr. Maurice March was on vacation. She let me know that she fainted on Tuesday and her daughters called the paramedics. She did not go to the hospital and says she has a small bump on her head but states she feels better. She will either have a friend Jerene Bears pick up her rx, her daughter, or she will pick it up on Monday of next week. I advised her to tell whoever comes to pick it up to bring their ID.

## 2011-05-12 ENCOUNTER — Ambulatory Visit: Payer: Medicare Other

## 2011-05-15 ENCOUNTER — Other Ambulatory Visit: Payer: Self-pay | Admitting: *Deleted

## 2011-05-15 DIAGNOSIS — B20 Human immunodeficiency virus [HIV] disease: Secondary | ICD-10-CM

## 2011-05-15 MED ORDER — ENSURE PO LIQD: 237.0000 mL | Freq: Every day | ORAL | Status: DC

## 2011-05-18 ENCOUNTER — Other Ambulatory Visit: Payer: Self-pay | Admitting: Licensed Clinical Social Worker

## 2011-05-18 DIAGNOSIS — I10 Essential (primary) hypertension: Secondary | ICD-10-CM

## 2011-05-18 MED ORDER — BENAZEPRIL-HYDROCHLOROTHIAZIDE 20-12.5 MG PO TABS: 2.0000 | ORAL_TABLET | Freq: Every day | ORAL | Status: DC

## 2011-05-19 ENCOUNTER — Ambulatory Visit: Payer: Medicare Other

## 2011-06-06 ENCOUNTER — Other Ambulatory Visit: Payer: Self-pay | Admitting: *Deleted

## 2011-06-06 DIAGNOSIS — E785 Hyperlipidemia, unspecified: Secondary | ICD-10-CM

## 2011-06-06 MED ORDER — LOVASTATIN 40 MG PO TABS: 40.0000 mg | ORAL_TABLET | Freq: Every day | ORAL | Status: DC

## 2011-06-07 ENCOUNTER — Other Ambulatory Visit: Payer: Self-pay | Admitting: *Deleted

## 2011-06-07 DIAGNOSIS — G894 Chronic pain syndrome: Secondary | ICD-10-CM

## 2011-06-07 DIAGNOSIS — M5126 Other intervertebral disc displacement, lumbar region: Secondary | ICD-10-CM

## 2011-06-07 MED ORDER — FENTANYL 50 MCG/HR TD PT72: 1.0000 | MEDICATED_PATCH | TRANSDERMAL | Status: DC

## 2011-06-07 MED ORDER — HYDROCODONE-APAP-DIETARY PROD 10-650 MG PO MISC: 1.0000 | Freq: Four times a day (QID) | ORAL | Status: DC | PRN

## 2011-06-07 NOTE — Telephone Encounter (Signed)
Placed Fentanyl rx in Dr. Bonnetta Barry box for signature.

## 2011-06-08 ENCOUNTER — Other Ambulatory Visit: Payer: Self-pay | Admitting: *Deleted

## 2011-06-08 DIAGNOSIS — B20 Human immunodeficiency virus [HIV] disease: Secondary | ICD-10-CM

## 2011-06-08 MED ORDER — EFAVIRENZ-EMTRICITAB-TENOFOVIR 600-200-300 MG PO TABS: 1.0000 | ORAL_TABLET | Freq: Every day | ORAL | Status: DC

## 2011-06-13 ENCOUNTER — Other Ambulatory Visit: Payer: Self-pay | Admitting: *Deleted

## 2011-06-13 DIAGNOSIS — B20 Human immunodeficiency virus [HIV] disease: Secondary | ICD-10-CM

## 2011-06-13 MED ORDER — EFAVIRENZ-EMTRICITAB-TENOFOVIR 600-200-300 MG PO TABS: 1.0000 | ORAL_TABLET | Freq: Every day | ORAL | Status: DC

## 2011-06-15 ENCOUNTER — Ambulatory Visit (INDEPENDENT_AMBULATORY_CARE_PROVIDER_SITE_OTHER): Payer: Medicare Other | Admitting: *Deleted

## 2011-06-15 ENCOUNTER — Encounter: Payer: Self-pay | Admitting: *Deleted

## 2011-06-15 VITALS — BP 164/80 | HR 86 | Temp 98.1°F | Resp 16 | Wt 133.2 lb

## 2011-06-15 DIAGNOSIS — E785 Hyperlipidemia, unspecified: Secondary | ICD-10-CM

## 2011-06-15 DIAGNOSIS — B20 Human immunodeficiency virus [HIV] disease: Secondary | ICD-10-CM

## 2011-06-15 LAB — LIPID PANEL
LDL Cholesterol: 58 mg/dL (ref 0–99)
VLDL: 17 mg/dL (ref 0–40)

## 2011-06-15 LAB — COMPREHENSIVE METABOLIC PANEL
ALT: 10 U/L (ref 0–35)
Albumin: 4.4 g/dL (ref 3.5–5.2)
Alkaline Phosphatase: 87 U/L (ref 39–117)
CO2: 22 mEq/L (ref 19–32)
Potassium: 3.5 mEq/L (ref 3.5–5.3)
Sodium: 140 mEq/L (ref 135–145)
Total Bilirubin: 0.4 mg/dL (ref 0.3–1.2)
Total Protein: 7.5 g/dL (ref 6.0–8.3)

## 2011-06-15 LAB — PROTEIN, URINE, RANDOM: Total Protein, Urine: 9 mg/dL

## 2011-06-15 LAB — CREATININE, URINE, RANDOM: Creatinine, Urine: 48.6 mg/dL

## 2011-06-15 NOTE — Progress Notes (Signed)
Jessica [name] is here for her final ALLRT study visit. She is not currently eligible for any other studies. She says she is doing fairly well, but has been noticing a few new problems. She has been having some dizzy spells with more frequency. She had actually passed out at home in February. She has also noticed some mid upper back pain that comes and goes with positioning that is probably muscular. Her left big toe is completely numb and the foot partially. She also has been noticing lower left abd pain after BMs.  She will continue to be followed by Dr. Maurice March in the clinic.

## 2011-07-06 ENCOUNTER — Encounter: Payer: Self-pay | Admitting: Infectious Diseases

## 2011-07-06 ENCOUNTER — Other Ambulatory Visit: Payer: Self-pay | Admitting: *Deleted

## 2011-07-06 DIAGNOSIS — M5126 Other intervertebral disc displacement, lumbar region: Secondary | ICD-10-CM

## 2011-07-06 DIAGNOSIS — G894 Chronic pain syndrome: Secondary | ICD-10-CM

## 2011-07-06 LAB — CD4/CD8 (T-HELPER/T-SUPPRESSOR CELL): CD4: 1210

## 2011-07-06 MED ORDER — FENTANYL 50 MCG/HR TD PT72: 1.0000 | MEDICATED_PATCH | TRANSDERMAL | Status: DC

## 2011-07-06 NOTE — Telephone Encounter (Signed)
Pt states she will pick it up tomorrow am

## 2011-07-20 ENCOUNTER — Ambulatory Visit (INDEPENDENT_AMBULATORY_CARE_PROVIDER_SITE_OTHER): Payer: Medicare Other | Admitting: Infectious Diseases

## 2011-07-20 ENCOUNTER — Encounter: Payer: Self-pay | Admitting: Infectious Diseases

## 2011-07-20 VITALS — BP 166/81 | HR 91 | Temp 97.8°F | Wt 126.0 lb

## 2011-07-20 DIAGNOSIS — R55 Syncope and collapse: Secondary | ICD-10-CM

## 2011-07-20 NOTE — Progress Notes (Signed)
Patient ID: Jessica [name], female   DOB: 1940-09-29, 71 y.o.   MRN: [id number] History:    Jessica [name]'s HIV is completely controlled and she has no symptoms of PVD. She has new problem of recurrent syncope over the past 3 months. Her sister is with her today because she doesn't want to drive alone. Beginning in February 2013 she had first syncopal episode and has had more than a dozen since. She is intelligent and has been trying to understand these episodes by reading on line and the differential diagnostic possibilities frighten her. She also has had several episodes while driving but she has not injured herself or anyone else and must loose contact with acclerator and only when driving in [place] and not on open highway. Typical episode starts with prodrome of feeling cold and nauseated but does not vomit. This is followed by sweats and lightheadedness. She then will find herself slumped over and with a few minutes of amnesia. She senses no chest pains, abd pain, fast or slow heart rate. She fully recovers in less than an hour. She readily admits that she is under mental duress with a grandson who is incarcerated and a daughter who has been drug addicted and intermtently incarcerated. She denies suicidality, use of illicit drugs. She does take Percocet 10/350 for chronic vertebral arthritis but fills these on monthly basis and has no evidence of diversion. In distant past she did abuse alcohol but not in 30 years. She is frightened and I am concerned.  BP 166/81  Pulse 91  Temp(Src) 97.8 F (36.6 C) (Oral)  Wt 126 lb (57.153 kg) no orthostatic changes.   Comfortable and not distressed but anxious about causes of syncope. Skin No rashes. No adnopathy. HEENT Pupils equal and reactive. Full ROM. No nystagmus No carotid bruits. Chest: Clear to auscultation and equal and good airmovement. COR. Gr 1/6 SEM no diastolic murmurs or rubs.Regular rhythm without pauses. ABD Soft and benign. Gait normal  and strength symmetric.  Labs See results and no abnormalites in renal, hepatic or marrow function noted. HIV< 20 copies CD4 1210  Impression HIV controlled and will continue Atripla New problem: Recurrent syncope for past three months. Seriously interfering with daily living and no longer independent. There are no clear clues from history and routine exam but will need to hospitalize and evaluate. Will admit to my B service team tomorrow. Her sister will drive her home and accompany her tomorrow. Dr. Gilford Rile is senior resident covering house today and he is informed. Admitting notified of need for telemetry bed at [facility] tomorrow. Finally I suspect that tension and anxiety will be the default cause but must be sure that there are no ominous issues like arrythmia or heart block from Lyme disease or ischemia. ECG could not be done since patient left with her sister before following up.    Jessica [name]

## 2011-07-21 ENCOUNTER — Observation Stay (HOSPITAL_COMMUNITY)
Admission: AD | Admit: 2011-07-21 | Discharge: 2011-07-23 | Disposition: A | Discharge status: Home / Self Care | Payer: Medicare Other | Source: Ambulatory Visit | Attending: Infectious Diseases | Admitting: Infectious Diseases

## 2011-07-21 ENCOUNTER — Encounter (HOSPITAL_COMMUNITY): Payer: Self-pay | Admitting: Pediatrics

## 2011-07-21 DIAGNOSIS — Z23 Encounter for immunization: Secondary | ICD-10-CM | POA: Diagnosis not present

## 2011-07-21 DIAGNOSIS — F329 Major depressive disorder, single episode, unspecified: Secondary | ICD-10-CM

## 2011-07-21 DIAGNOSIS — Z21 Asymptomatic human immunodeficiency virus [HIV] infection status: Secondary | ICD-10-CM | POA: Diagnosis not present

## 2011-07-21 DIAGNOSIS — I10 Essential (primary) hypertension: Secondary | ICD-10-CM

## 2011-07-21 DIAGNOSIS — B49 Unspecified mycosis: Secondary | ICD-10-CM

## 2011-07-21 DIAGNOSIS — H409 Unspecified glaucoma: Secondary | ICD-10-CM

## 2011-07-21 DIAGNOSIS — R209 Unspecified disturbances of skin sensation: Secondary | ICD-10-CM

## 2011-07-21 DIAGNOSIS — G894 Chronic pain syndrome: Secondary | ICD-10-CM | POA: Diagnosis not present

## 2011-07-21 DIAGNOSIS — R55 Syncope and collapse: Secondary | ICD-10-CM | POA: Diagnosis not present

## 2011-07-21 DIAGNOSIS — A6000 Herpesviral infection of urogenital system, unspecified: Secondary | ICD-10-CM

## 2011-07-21 DIAGNOSIS — M5126 Other intervertebral disc displacement, lumbar region: Secondary | ICD-10-CM

## 2011-07-21 DIAGNOSIS — F3289 Other specified depressive episodes: Secondary | ICD-10-CM

## 2011-07-21 DIAGNOSIS — L309 Dermatitis, unspecified: Secondary | ICD-10-CM

## 2011-07-21 DIAGNOSIS — E785 Hyperlipidemia, unspecified: Secondary | ICD-10-CM

## 2011-07-21 DIAGNOSIS — L219 Seborrheic dermatitis, unspecified: Secondary | ICD-10-CM

## 2011-07-21 DIAGNOSIS — K5900 Constipation, unspecified: Secondary | ICD-10-CM

## 2011-07-21 DIAGNOSIS — R8789 Other abnormal findings in specimens from female genital organs: Secondary | ICD-10-CM

## 2011-07-21 DIAGNOSIS — B20 Human immunodeficiency virus [HIV] disease: Secondary | ICD-10-CM

## 2011-07-21 DIAGNOSIS — K56609 Unspecified intestinal obstruction, unspecified as to partial versus complete obstruction: Secondary | ICD-10-CM

## 2011-07-21 DIAGNOSIS — L089 Local infection of the skin and subcutaneous tissue, unspecified: Secondary | ICD-10-CM

## 2011-07-21 DIAGNOSIS — N3000 Acute cystitis without hematuria: Secondary | ICD-10-CM

## 2011-07-21 HISTORY — DX: Cardiac murmur, unspecified: R01.1

## 2011-07-21 LAB — CBC
HCT: 35.4 % — ABNORMAL LOW (ref 36.0–46.0)
MCHC: 35 g/dL (ref 30.0–36.0)
Platelets: 303 10*3/uL (ref 150–400)
RDW: 13.4 % (ref 11.5–15.5)
WBC: 8.3 10*3/uL (ref 4.0–10.5)

## 2011-07-21 LAB — COMPREHENSIVE METABOLIC PANEL
ALT: 9 U/L (ref 0–35)
AST: 24 U/L (ref 0–37)
Albumin: 3.6 g/dL (ref 3.5–5.2)
Alkaline Phosphatase: 88 U/L (ref 39–117)
Calcium: 9.1 mg/dL (ref 8.4–10.5)
Chloride: 105 mEq/L (ref 96–112)
Creatinine, Ser: 0.53 mg/dL (ref 0.50–1.10)
GFR calc Af Amer: >90 mL/min (ref >90)
GFR calc non Af Amer: >90 mL/min (ref >90)
Total Bilirubin: 0.1 mg/dL — ABNORMAL LOW (ref 0.3–1.2)

## 2011-07-21 LAB — DIFFERENTIAL
Basophils Absolute: 0.1 10*3/uL (ref 0.0–0.1)
Basophils Relative: 1 % (ref 0–1)
Lymphocytes Relative: 64 % — ABNORMAL HIGH (ref 12–46)
Monocytes Absolute: 0.4 10*3/uL (ref 0.1–1.0)
Neutro Abs: 2.4 10*3/uL (ref 1.7–7.7)
Neutrophils Relative %: 29 % — ABNORMAL LOW (ref 43–77)

## 2011-07-21 LAB — PRO B NATRIURETIC PEPTIDE: Pro B Natriuretic peptide (BNP): 21 pg/mL (ref 0–125)

## 2011-07-21 MED ORDER — FENTANYL 25 MCG/HR TD PT72
50.0000 ug | MEDICATED_PATCH | TRANSDERMAL | Status: DC
  Administered 2011-07-23: 50 ug via TRANSDERMAL
  Filled 2011-07-21: qty 2

## 2011-07-21 MED ORDER — ENSURE COMPLETE PO LIQD
237.0000 mL | Freq: Two times a day (BID) | ORAL | Status: DC
  Administered 2011-07-21 – 2011-07-23 (×4): 237 mL via ORAL

## 2011-07-21 MED ORDER — PNEUMOCOCCAL VAC POLYVALENT 25 MCG/0.5ML IJ INJ
0.5000 mL | INJECTION | INTRAMUSCULAR | Status: AC
  Administered 2011-07-22: 0.5 mL via INTRAMUSCULAR
  Filled 2011-07-21: qty 0.5

## 2011-07-21 MED ORDER — SIMVASTATIN 20 MG PO TABS
20.0000 mg | ORAL_TABLET | Freq: Every day | ORAL | Status: DC
  Filled 2011-07-21: qty 1

## 2011-07-21 MED ORDER — HYDROCODONE-APAP-DIETARY PROD 10-650 MG PO MISC: 1.0000 | Freq: Four times a day (QID) | ORAL | Status: DC | PRN

## 2011-07-21 MED ORDER — ALPRAZOLAM 0.5 MG PO TABS: 1.0000 mg | ORAL_TABLET | Freq: Two times a day (BID) | ORAL | Status: DC | PRN

## 2011-07-21 MED ORDER — ENSURE PO LIQD: 237.0000 mL | Freq: Two times a day (BID) | ORAL | Status: DC

## 2011-07-21 MED ORDER — TRIAMCINOLONE ACETONIDE 0.1 % EX OINT
1.0000 "application " | TOPICAL_OINTMENT | Freq: Two times a day (BID) | CUTANEOUS | Status: DC
  Administered 2011-07-23: 1 via TOPICAL
  Filled 2011-07-21: qty 15

## 2011-07-21 MED ORDER — HEPARIN SODIUM (PORCINE) 5000 UNIT/ML IJ SOLN: 5000.0000 [IU] | Freq: Three times a day (TID) | INTRAMUSCULAR | Status: DC

## 2011-07-21 MED ORDER — ASPIRIN EC 81 MG PO TBEC
81.0000 mg | DELAYED_RELEASE_TABLET | Freq: Every day | ORAL | Status: DC
  Administered 2011-07-22 – 2011-07-23 (×2): 81 mg via ORAL
  Filled 2011-07-21 (×3): qty 1

## 2011-07-21 MED ORDER — HYDROCODONE-ACETAMINOPHEN 5-325 MG PO TABS: 2.0000 | ORAL_TABLET | Freq: Four times a day (QID) | ORAL | Status: DC | PRN

## 2011-07-21 MED ORDER — HYDROCHLOROTHIAZIDE 25 MG PO TABS
25.0000 mg | ORAL_TABLET | Freq: Every day | ORAL | Status: DC
  Administered 2011-07-21 – 2011-07-23 (×3): 25 mg via ORAL
  Filled 2011-07-21 (×3): qty 1

## 2011-07-21 MED ORDER — ONDANSETRON HCL 4 MG/2ML IJ SOLN: 4.0000 mg | Freq: Four times a day (QID) | INTRAMUSCULAR | Status: DC | PRN

## 2011-07-21 MED ORDER — DOCUSATE SODIUM 100 MG PO CAPS
200.0000 mg | ORAL_CAPSULE | Freq: Three times a day (TID) | ORAL | Status: DC | PRN
  Administered 2011-07-21: 300 mg via ORAL
  Filled 2011-07-21 (×2): qty 3

## 2011-07-21 MED ORDER — BENAZEPRIL HCL 40 MG PO TABS
40.0000 mg | ORAL_TABLET | Freq: Every day | ORAL | Status: DC
  Administered 2011-07-21 – 2011-07-23 (×3): 40 mg via ORAL
  Filled 2011-07-21 (×3): qty 1

## 2011-07-21 MED ORDER — ATORVASTATIN CALCIUM 10 MG PO TABS
10.0000 mg | ORAL_TABLET | Freq: Every day | ORAL | Status: DC
  Administered 2011-07-21 – 2011-07-22 (×2): 10 mg via ORAL
  Filled 2011-07-21 (×3): qty 1

## 2011-07-21 MED ORDER — ACETAMINOPHEN 500 MG PO TABS
500.0000 mg | ORAL_TABLET | ORAL | Status: DC | PRN
  Filled 2011-07-21: qty 1

## 2011-07-21 MED ORDER — CYCLOBENZAPRINE HCL 10 MG PO TABS: 10.0000 mg | ORAL_TABLET | Freq: Three times a day (TID) | ORAL | Status: DC | PRN

## 2011-07-21 MED ORDER — ONDANSETRON HCL 4 MG PO TABS: 4.0000 mg | ORAL_TABLET | Freq: Four times a day (QID) | ORAL | Status: DC | PRN

## 2011-07-21 MED ORDER — EFAVIRENZ-EMTRICITAB-TENOFOVIR 600-200-300 MG PO TABS
1.0000 | ORAL_TABLET | Freq: Every day | ORAL | Status: DC
  Administered 2011-07-21 – 2011-07-22 (×2): 1 via ORAL
  Filled 2011-07-21 (×3): qty 1

## 2011-07-21 MED ORDER — SODIUM CHLORIDE 0.9 % IJ SOLN
3.0000 mL | Freq: Two times a day (BID) | INTRAMUSCULAR | Status: DC
  Administered 2011-07-21 – 2011-07-23 (×4): 3 mL via INTRAVENOUS

## 2011-07-21 MED ORDER — FENTANYL 25 MCG/HR TD PT72: 50.0000 ug | MEDICATED_PATCH | TRANSDERMAL | Status: DC

## 2011-07-21 MED ORDER — ASPIRIN 81 MG PO TBEC: 81.0000 mg | DELAYED_RELEASE_TABLET | Freq: Every day | ORAL | Status: DC

## 2011-07-21 MED ORDER — TRAVOPROST (BAK FREE) 0.004 % OP SOLN
1.0000 [drp] | Freq: Every day | OPHTHALMIC | Status: DC
  Filled 2011-07-21: qty 2.5

## 2011-07-21 MED ORDER — BENAZEPRIL-HYDROCHLOROTHIAZIDE 20-12.5 MG PO TABS: 2.0000 | ORAL_TABLET | Freq: Every day | ORAL | Status: DC

## 2011-07-21 NOTE — H&P (Addendum)
Medical Student Hospital Admission Note Date: 07/21/2011  Patient name: [name] Medical record number: [id number] Date of birth: [birth year] Age: 71 y.o. Gender: female PCP: Dr. Roselyn Reef  Medical Service: Internal Medicine  Attending physician:  Dr. Roselyn Reef     Chief Complaint: Recurrent Episodes of Loss of Consciousness (LOC)  History of Present Illness: The patient is a 71 year old woman with a history of HIV infection, hypertension, and hyperlipidemia s/p SBO surgery in October 2012 who was directly admitted form the clinic by Dr. Maurice March for recurrent episodes of LOC. She describes blurry vision changes, frontal head aches, and a sense of environmental swaying immediately preceeding LOC. She reports weakness after regainning conciousness to the extent that she is unable to stand. She reports finding out that she bit her buccal mucosa after some episodes. She estimates these episodes  last from seconds to a few minutes based on activities that occurred around most events. Patient reports that she occasionally felt dizzy with sweating or feeling cold when straining to pass constipated feces but symptoms have gotten worse since after her SBO surgery last October. Her first major LOC episode was in February 2013 and she has had about a dozen episodes since then. Two episodes occurred when she was under a lot of stress, but more have occurred when she is relaxed and watching TV or leisurely driving. Patient reports she occasionally experieces auras when she stands up but the symptoms resolve without LOC because she sits down immediately.  Patient reports an unintentional weight loss of 14 Lbs since Christmas 2012 after gaining weight to 130 Lbs following her surgery in October 2012. She denies head ache, fever, nausea/vomiting, chest pain, SOB, palpitations, or abdominal pain. She endorse a history of constipation. Patient reports a lifetime of poor appetite.  Meds: Medications Prior to  Admission  Medication Sig Dispense Refill  . acetaminophen (TYLENOL) 500 MG tablet Take 500 mg by mouth every 4 (four) hours as needed. For pain      . acyclovir (ZOVIRAX) 400 MG tablet Take 1 tablet (400 mg total) by mouth 3 (three) times daily. For 7 days as needed for herpes outbreak  21 tablet  prn  . ALPRAZolam (XANAX) 1 MG tablet Take 1 mg by mouth 2 (two) times daily.      Marland Kitchen aspirin 81 MG EC tablet Take 81 mg by mouth daily.        . benazepril-hydrochlorthiazide (LOTENSIN HCT) 20-12.5 MG per tablet Take 2 tablets by mouth daily.  60 tablet  5  . cyclobenzaprine (FLEXERIL) 10 MG tablet Take 10 mg by mouth 3 (three) times daily as needed. For spasms      . docusate sodium (COLACE) 100 MG capsule Take 200-300 mg by mouth 3 (three) times daily as needed. For constipation. Pt takes 2 capsules in and and 3 in capsules in pm      . efavirenz-emtrictabine-tenofovir (ATRIPLA) 600-200-300 MG per tablet Take 1 tablet by mouth at bedtime.  30 tablet  6  . ENSURE (ENSURE) Take 237 mLs by mouth 2 (two) times daily.      . fentaNYL (DURAGESIC) 50 MCG/HR Place 1 patch (50 mcg total) onto the skin every 3 (three) days.  10 patch  0  . Hydrocodone-APAP-Dietary Prod (HYDROCODONE-APAP-NUTRIT SUPP) 10-650 MG MISC Take 1 tablet by mouth every 6 (six) hours as needed. For pain      . lovastatin (MEVACOR) 40 MG tablet Take 1 tablet (40 mg total) by mouth at  bedtime.  30 tablet  5  . triamcinolone (KENALOG) 0.1 % ointment Apply 1 application topically 2 (two) times daily.  30 g  3  . DISCONTD: ALPRAZolam (XANAX) 1 MG tablet Take 1 tablet (1 mg total) by mouth 3 (three) times daily as needed.  60 tablet  5  . DISCONTD: cyclobenzaprine (FLEXERIL) 10 MG tablet Take 1 tablet (10 mg total) by mouth 3 (three) times daily as needed.  60 tablet  11  . DISCONTD: ENSURE (ENSURE) Take 237 mLs by mouth daily.  237 mL  12  . DISCONTD: Hydrocodone-APAP-Dietary Prod (HYDROCODONE-APAP-NUTRIT SUPP) 10-650 MG MISC Take 1 tablet by  mouth every 6 (six) hours as needed.  60 each  2  . DISCONTD: travoprost, benzalkonium, (TRAVATAN Z) 0.004 % ophthalmic solution Place 1 drop into both eyes at bedtime.          Allergies: Allergies as of 07/21/2011  . (No Known Allergies)   Past Medical History  Diagnosis Date  . Glaucoma   . Arthritis   . Hypertension   . HIV infection   . Hyperlipidemia   . SBO (small bowel obstruction) 12/25/2010   Past Surgical History  Procedure Date  . Eye surgery   . Abdominal hysterectomy   . Femoral endarterectomy   . Aorto-femoral bypass graft   . Small intestine surgery 12/25/2010    Dr Jamey Ripa   Family History Mom -  Renal failure; history of seizure-like activities without LOC Dad - Prostate Cancer, Blindness Sister(deceased) - Kidney Disease requiring transplant Sister (living) - History of seizure-like activities without loss of conciousnes  History   Social History  . Marital Status: Widowed    Spouse Name: N/A    Number of Children: 2  . Years of Education: 14   Occupational History  .  Architect, Engineer, civil (consulting)    Social History Main Topics  . Smoking status: Smoker -- 0.75 packs/day for 50 years    Types: Cigarettes    Quit date: 12/24/2010 ( relapsed on Thanksgiving Day 2012)  . Smokeless tobacco: Never Used  . Alcohol Use: No  . Drug Use: No  . Sexually Active: No   Other Topics Concern  . Not on file   Social History Narrative  . No narrative on file    Review of Systems: All negative except per HPI  Physical Exam: Blood pressure 146/70, pulse 77, temperature 98.5 F (36.9 C), temperature source Oral, resp. rate 20, height 5\' 7"  (1.702 m), weight 52.7 kg (116 lb 2.9 oz), SpO2 97.00%. BP 137/72  Pulse 73  Temp(Src) 98.5 F (36.9 C) (Oral)  Resp 20  Ht 5\' 7"  (1.702 m)  Wt 52.7 kg (116 lb 2.9 oz)  BMI 18.20 kg/m2  SpO2 97%  General Appearance:    Alert, cooperative, no distress, appears stated age  Head:    Normocephalic,  without obvious abnormality, atraumatic  Eyes:    PERRL, conjunctiva/corneas clear, EOM's intact,   Ears:    Normal TM's and external ear canals, both ears  Nose:   Nares normal, septum midline, mucosa normal, no drainage    or sinus tenderness  Throat:   Lips, mucosa, and tongue normal; teeth and gums normal  Neck:   Supple, symmetrical, trachea midline, no adenopathy;    thyroid:  no enlargement/tenderness/nodules; no carotid   bruit or JVD  Back:     Symmetric, no curvature, ROM normal  Lungs:     Clear to auscultation bilaterally, respirations unlabored  Chest Wall:    No tenderness or deformity   Heart:    Regular rate and rhythm, S1 and S2 normal, no murmur, rub   or gallop     Abdomen:     Soft, non-tender, bowel sounds active all four quadrants,    no masses, no organomegaly        Extremities:   Extremities normal, atraumatic, no cyanosis or edema  Pulses:   2+ and symmetric all extremities  Skin:   Skin color, texture, turgor normal, no rashes or lesions  Lymph nodes:   Cervical, supraclavicular, and axillary nodes normal  Neurologic:   CNII-XII intact, normal strength, sensation and reflexes    throughout    Lab results: Basic Metabolic Panel:  Basename 07/21/11 1634  NA 136  K 4.0  CL 105  CO2 19  GLUCOSE 89  BUN 18  CREATININE 0.53  CALCIUM 9.1  MG --  PHOS --   Liver Function Tests:  Basename 07/21/11 1634  AST 24  ALT 9  ALKPHOS 88  BILITOT 0.1*  PROT 7.3  ALBUMIN 3.6   No results found for this basename: LIPASE:2,AMYLASE:2 in the last 72 hours No results found for this basename: AMMONIA:2 in the last 72 hours CBC:  Basename 07/21/11 1634  WBC 8.3  NEUTROABS 2.4  HGB 12.4  HCT 35.4*  MCV 95.2  PLT 303   Cardiac Enzymes: No results found for this basename: CKTOTAL:3,CKMB:3,CKMBINDEX:3,TROPONINI:3 in the last 72 hours BNP: No results found for this basename: PROBNP:3 in the last 72 hours D-Dimer: No results found for this basename:  DDIMER:2 in the last 72 hours CBG: No results found for this basename: GLUCAP:6 in the last 72 hours Hemoglobin A1C: No results found for this basename: HGBA1C in the last 72 hours Fasting Lipid Panel: No results found for this basename: CHOL,HDL,LDLCALC,TRIG,CHOLHDL,LDLDIRECT in the last 72 hours Thyroid Function Tests: No results found for this basename: TSH,T4TOTAL,FREET4,T3FREE,THYROIDAB in the last 72 hours Anemia Panel: No results found for this basename: VITAMINB12,FOLATE,FERRITIN,TIBC,IRON,RETICCTPCT in the last 72 hours Coagulation: No results found for this basename: LABPROT:2,INR:2 in the last 72 hours Urine Drug Screen: Drugs of Abuse  No results found for this basename: labopia, cocainscrnur, labbenz, amphetmu, thcu, labbarb    Alcohol Level: No results found for this basename: ETH:2 in the last 72 hours Urinalysis: No results found for this basename: COLORURINE:2,APPERANCEUR:2,LABSPEC:2,PHURINE:2,GLUCOSEU:2,HGBUR:2,BILIRUBINUR:2,KETONESUR:2,PROTEINUR:2,UROBILINOGEN:2,NITRITE:2,LEUKOCYTESUR:2 in the last 72 hours    Imaging results:  No results found.  Other results: EKG: Normal EKG, normal sinus rhythm  Assessment & Plan by Problem: Principal Problem:  *Syncope Active Problems:  HIV INFECTION  GLAUCOMA NOS  HYPERTENSION  Chronic pain syndrome  1. Syncope Assessment: Patient's clinical presentation is consistent with syncopal episodes. However, the etiology of these episodes is not very apparent. There is a high probability for vasovagal syncope given that her symptoms started  with straining on defecation and got worse after the stress of surgery. There is also a high probability for seizure activity given that patient has bitten her buccal mucosa on some occasions and has weakness after all episode. The probability of orthostatic hypotension is high given symptoms with standing up.  Hypovolemia or hemorrhage are low on the differential because the patient is not  tachycardic and has moist mucosa. She has a history of hypertension but a normal ECG put cardiomyopathy low on the differential. Aortic stenosis is low on the differential because no murmurs were appreceiated on auscultation.   Plan: Orthostatic hypotension test will be ordered.  Patient will be placed on telemetry to capture any cardiac events during her stay. We will order CBC, BMP, cardiac enzymes x3, and TSH to monitor for metabolic and other etiologies. Her medications will be re-evaluate and adjusted to minimize drug causes of syncope.  HYPERTENSION: Continue - Benzopril-HCTZ  HIV INFECTION Continue HIV regimen  Chronic pain syndrome Evaluate current regimen for syncopal SE and adjust as necessary  GLAUCOMA NOS Continue current magnagement   This is a Psychologist, occupational Note.  The care of the patient was discussed with Dr.Heaton Sarin pateland the assessment and plan was formulated with their assistance.  Please see their note for official documentation of the patient encounter.   SignedThornton Papas 07/21/2011, 5:48 PM   Internal Medicine Teaching Service Resident Admission Note Date: 07/21/2011  Patient name: Jessica [name] [facility] Medical record number: [id number] Date of birth: 1941-02-21 Age: 71 y.o. Gender: female PCP: Dr. Darlina Sicilian  Medical Service: Josefine Class  I have reviewed the note by Allyson Sabal MS 4 and was present during the interview and physical exam.  Please see below for findings, assessment, and plan.  Chief Complaint: Syncopal spells  History of Present Illness: Pt is a pleasant 70 yo woman with PMH of HIV infection, Chronic lumbar pain, depression,  HTN, constipation who is admitted to the hospital due to recurrent Syncopal spells for past 3 months.  She had her first syncope in 04/2011 which was apparently triggered by emotional reaction from her Grandson's incarceration. She felt nauseated and had sweating and passed out. Unclear how long she lost  consciousness. She denies any palpitations with that spell or any subsequent spells. The other severe episode she had was in 06/2011 when she again had a emotional trigger, related to another incarceration episode of her grandson, and the syncope episode had similar presentation at that time. Apart from above 2 spells, she had other mild episodes, about 10 in total- 1 of them while she was driving. But these were apparently without any specific trigger. She did not totally pass out during these spells. Also does not describe any palpitations. Though she does have vision changes in terms of blurred vision with the spells. She does not have weakness or numbness prior to episodes or has any severe headaches. Does not have a history of migraine headaches.  Of note- patient lost about 14 pounds weight in past 5 months- unintentional. Also has intermittent night sweats. But no fevers.   She has baseline decreased appetite and does not drink enough liquid everyday. She does have chronic constipation and view of her mild syncope spells are correlated with straining at stool. But she never passed out during those episodes. She denies any chest pain, shortness of breath, abdominal pain, diarrhea, no urinary abnormalities, no burning micturition.  Meds: Medications Prior to Admission Medication Sig Dispense Refill . acetaminophen (TYLENOL) 500 MG tablet Take 500 mg by mouth every 4 (four) hours as needed. For pain     . acyclovir (ZOVIRAX) 400 MG tablet Take 1 tablet (400 mg total) by mouth 3 (three) times daily. For 7 days as needed for herpes outbreak  21 tablet  prn . ALPRAZolam (XANAX) 1 MG tablet Take 1 mg by mouth 2 (two) times daily.     Marland Kitchen aspirin 81 MG EC tablet Take 81 mg by mouth daily.       . benazepril-hydrochlorthiazide (LOTENSIN HCT) 20-12.5 MG per tablet Take 2 tablets by mouth daily.  60 tablet  5 . cyclobenzaprine (FLEXERIL) 10 MG  tablet Take 10 mg by mouth 3 (three) times daily as needed.  For spasms     . docusate sodium (COLACE) 100 MG capsule Take 200-300 mg by mouth 3 (three) times daily as needed. For constipation. Pt takes 2 capsules in and and 3 in capsules in pm     . efavirenz-emtrictabine-tenofovir (ATRIPLA) 600-200-300 MG per tablet Take 1 tablet by mouth at bedtime.  30 tablet  6 . ENSURE (ENSURE) Take 237 mLs by mouth 2 (two) times daily.     . fentaNYL (DURAGESIC) 50 MCG/HR Place 1 patch (50 mcg total) onto the skin every 3 (three) days.  10 patch  0 . Hydrocodone-APAP-Dietary Prod (HYDROCODONE-APAP-NUTRIT SUPP) 10-650 MG MISC Take 1 tablet by mouth every 6 (six) hours as needed. For pain     . lovastatin (MEVACOR) 40 MG tablet Take 1 tablet (40 mg total) by mouth at bedtime.  30 tablet  5 . triamcinolone (KENALOG) 0.1 % ointment Apply 1 application topically 2 (two) times daily.  30 g  3 . DISCONTD: ALPRAZolam (XANAX) 1 MG tablet Take 1 tablet (1 mg total) by mouth 3 (three) times daily as needed.  60 tablet  5 . DISCONTD: cyclobenzaprine (FLEXERIL) 10 MG tablet Take 1 tablet (10 mg total) by mouth 3 (three) times daily as needed.  60 tablet  11 . DISCONTD: ENSURE (ENSURE) Take 237 mLs by mouth daily.  237 mL  12 . DISCONTD: Hydrocodone-APAP-Dietary Prod (HYDROCODONE-APAP-NUTRIT SUPP) 10-650 MG MISC Take 1 tablet by mouth every 6 (six) hours as needed.  60 each  2 . DISCONTD: travoprost, benzalkonium, (TRAVATAN Z) 0.004 % ophthalmic solution Place 1 drop into both eyes at bedtime.         Allergies: Allergies as of 07/21/2011 . (No Known Allergies)   Past Medical History: Medical Student note reviewed  Family History: Medical Student note reviewed  Social History: Medical Student note reviewed  Surgical History: Medical Student note reviewed  Review of System: Medical Student note reviewed  Physical Exam: Blood pressure 146/70, pulse 77, temperature 98.5 F (36.9 C), temperature source Oral, resp. rate 20, height 5\' 7"  (1.702 m), weight 116 lb 2.9 oz  (52.7 kg), SpO2 97.00%. Constitutional: Vital signs reviewed.  Patient is cachectic,  in no acute distress and cooperative with exam. Alert and oriented x3.  Head: Normocephalic and atraumatic Mouth: no erythema or exudates, MMM Eyes: PERRL, EOMI, conjunctivae normal, No scleral icterus.  Neck: Supple, Trachea midline normal ROM, mild JVD,  No mass, thyromegaly, or carotid bruit present.  Cardiovascular: RRR, S1 normal, S2 normal, no MRG, pulses symmetric and intact bilaterally Pulmonary/Chest: CTAB, no wheezes, rales, or rhonchi Abdominal: Soft. Non-tender, non-distended, bowel sounds are normal, no masses, organomegaly, or guarding present.  Musculoskeletal: No joint deformities, erythema, or stiffness, ROM full and no nontender Neurological: A&O x3, Strenght is normal and symmetric bilaterally, cranial nerve II-XII are grossly intact, no focal motor deficit, sensory intact to light touch bilaterally, no dysdiadochokinesia, Romberg sign is negative.  Skin: Warm, dry and intact. No rash, cyanosis, or clubbing.  Psychiatric: Normal mood and affect. speech and behavior is normal. Judgment and thought content normal. Cognition and memory are normal.     Labs: Reviewed as noted in the Electronic Record  Imaging: Reviewed as noted in the Electronic Record  Assessment & Plan by Problem:  1) Syncope: Unclear etiology at present. Most likely vasovagal versus cardiogenic syncope. Patient has emotional triggers before 2 severe episodes- which points towards vasovagal etiology- but her  other partial syncope spells were not associated with any specific triggers.  Patient does have history of PVD but no CAD. No history of arrhythmias or CVA.  No apparent electrolyte abnormalities or infectious etiology. Also does not use any recreational drugs or drink alcohol. Although she is on multiple pain medications including Vicodin, fentanyl, Flexeril along with Xanax for anxiety and sleep- which might be  complicating the presentation. Unclear if patient has seizures - but no significant post ictal confusion. No observed spells   - Admit to telemetry bed for observation - Check orthostatic vitals- which were negative in clinic yesterday. - Check TSH, BNP, CMP, CBC w/diff-- BNP will be checked to r/o Cardiac cause of Syncope- It has been suggested that measurement of plasma BNP may be useful in distinguishing cardiac from noncardiac causes of syncope- Up Todate.  - Check 12-lead EKG. - Will check cardiac enzymes x3 - Will hold off any neurologic workup at present- due to absence of focal neurological findings. - If negative workup in hospital-will consider loop recorder as outpatient per cardiology.   2) HIV INFECTION: Excellent control on Atripla. Last CD4 count 1210- on 06/15/2011 with VL< 40 - Continue Atripla .  3) GLAUCOMA : - Continue Travatan  4)  HYPERTENSION: Blood pressure mildly elevated.  - Will continue Lotensin and HCTZ for now. Will hold if orthostatic.  5)  Chronic pain syndrome: - Continue Flexeril, Vicodin, fentanyl at home dose. - If no apparent etiology for syncope identified during this hospital stay, would recommend switching round pain medications.   6) DVT prophylaxis: SCDs  Signed: Creig Landin 07/21/2011, 6:01 PM

## 2011-07-22 DIAGNOSIS — M5126 Other intervertebral disc displacement, lumbar region: Secondary | ICD-10-CM

## 2011-07-22 LAB — CARDIAC PANEL(CRET KIN+CKTOT+MB+TROPI)
CK, MB: 1.6 ng/mL (ref 0.3–4.0)
CK, MB: 1.6 ng/mL (ref 0.3–4.0)
Relative Index: INVALID (ref 0.0–2.5)
Total CK: 60 U/L (ref 7–177)
Troponin I: <0.3 ng/mL (ref <0.30)
Troponin I: <0.3 ng/mL (ref <0.30)

## 2011-07-22 LAB — TSH: TSH: 1.915 u[IU]/mL (ref 0.350–4.500)

## 2011-07-22 MED ORDER — BISACODYL 5 MG PO TBEC
10.0000 mg | DELAYED_RELEASE_TABLET | Freq: Every day | ORAL | Status: DC | PRN
  Administered 2011-07-22: 10 mg via ORAL
  Filled 2011-07-22: qty 2

## 2011-07-22 NOTE — H&P (Signed)
42 woman with HIV on rx in remission.  Admitted for recurrent syncope, often complete, once with oral bite, never with incontinence.  Never witnessed.  Her impression is out for 0 to 1-2 minutes.  Episodes while driving have not resulted in accident..  Earliest attacks associated with defecation, but this has not been persistent pattern.  No chest pain or unusual dyspnea.  No sustained post-ictal syndrome.   No hx of CNS infection or trauma.  Mother and sister have spells; not clearly diagnosed as epilepsy. CNS exam normal.  Cardiac exam normal and monitor normal overnight.  EKG normal. Imp: Few hints of epilepsy and more hints of vasovagal spells.  Believe we should evaluate for epilepsy with EEG and MR.  ?? Neuro consult. Vaso-vagal is partly dx of exclusion in this case.  If she is r/o for epilepsy (likely), would ask Dr. Maurice March to attempt reduction of med list, esp analgesics and anti-HTNs.  I am unsure whether ART holds relevant offenders.  She would also need to be instructed in lifestyle precautions, possibly including cessation of driving, and family dynamics may need some attention.

## 2011-07-22 NOTE — Progress Notes (Signed)
Medical Student Daily Progress Note  Subjective: Patient reports having a brief episode of lightheadedness without loss of consciousness this morning. She reports promptly calling the nurse who took her blood pressure which was normal. She complained of not having a bowel movement since Wednesday 06/19/11. Patient continues to rest comfortably under telemetry and understands she cannot get out of bed without calling her nurse.    Objective: Vital signs in last 24 hours: Filed Vitals:   07/21/11 2134 07/22/11 0312 07/22/11 0526 07/22/11 0620  BP: 147/54 108/53  109/66  Pulse: 73 62  67  Temp: 97.3 F (36.3 C) 97.6 F (36.4 C)  98.1 F (36.7 C)  TempSrc: Oral     Resp: 18 18  18   Height:      Weight:   56.972 kg (125 lb 9.6 oz)   SpO2: 99% 99%  97%   Weight change:   Intake/Output Summary (Last 24 hours) at 07/22/11 1201 Last data filed at 07/22/11 1118  Gross per 24 hour  Intake    486 ml  Output   1100 ml  Net   -614 ml   Physical Exam: BP 109/66  Pulse 67  Temp(Src) 98.1 F (36.7 C) (Oral)  Resp 18  Ht 5\' 7"  (1.702 m)  Wt 56.972 kg (125 lb 9.6 oz)  BMI 19.67 kg/m2  SpO2 97%  General Appearance:    Alert, cooperative, no distress, appears stated age  Head:    Normocephalic, without obvious abnormality, atraumatic  Eyes:    PERRL, conjunctiva/corneas clear, EOM's intact, both eyes  Ears:    Normal TM's and external ear canals, both ears  Nose:   Nares normal, septum midline, mucosa normal, no drainage    or sinus tenderness  Throat:   Moist mucosa, good dentition.  Neck:   Supple, symmetrical, trachea midline, no adenopathy;    thyroid:  no enlargement/tenderness/nodules; no carotid   bruit or JVD     Lungs:     Clear to auscultation bilaterally, respirations unlabored  Chest Wall:    No tenderness or deformity   Heart:    Regular rate and rhythm, S1 and S2 normal, II/VI systolic   Murmur, no rub or gallop     Abdomen:     Soft, non-tender, bowel sounds active  all four quadrants,    no masses, no organomegaly. Long epigastric to suprapubic  midline post-op. scar        Extremities:   Extremities normal, atraumatic, no cyanosis or edema  Pulses:   2+ and symmetric all extremities  Skin:   Skin color, texture, turgor normal, no rashes or lesions  Lymph nodes:   Cervical, supraclavicular, and axillary nodes normal  Neurologic:   CNII-XII intact, normal strength, sensation and reflexes    throughout   Lab Results: Basic Metabolic Panel:  Lab 07/21/11 1610  NA 136  K 4.0  CL 105  CO2 19  GLUCOSE 89  BUN 18  CREATININE 0.53  CALCIUM 9.1  MG --  PHOS --   Liver Function Tests:  Lab 07/21/11 1634  AST 24  ALT 9  ALKPHOS 88  BILITOT 0.1*  PROT 7.3  ALBUMIN 3.6   CBC:  Lab 07/21/11 1634  WBC 8.3  NEUTROABS 2.4  HGB 12.4  HCT 35.4*  MCV 95.2  PLT 303   Cardiac Enzymes:  Lab 07/22/11 0520 07/22/11 0016 07/21/11 2021  CKTOTAL 60 65 69  CKMB 1.6 1.6 1.5  CKMBINDEX -- -- --  TROPONINI <0.30 <  0.30 <0.30   BNP:  Lab 07/21/11 2021  PROBNP 21.0   Thyroid Function Tests:  Lab 07/21/11 1634  TSH 1.915  T4TOTAL --  FREET4 --  T3FREE --  THYROIDAB --   Studies/Results: No results found. Medications: I have reviewed the patient's current medications. Scheduled Meds:   . aspirin EC  81 mg Oral Daily  . atorvastatin  10 mg Oral q1800  . benazepril  40 mg Oral Daily  . efavirenz-emtrictabine-tenofovir  1 tablet Oral QHS  . feeding supplement  237 mL Oral BID  . fentaNYL  50 mcg Transdermal Q72H  . hydrochlorothiazide  25 mg Oral Daily  . pneumococcal 23 valent vaccine  0.5 mL Intramuscular Tomorrow-1000  . sodium chloride  3 mL Intravenous Q12H  . Travoprost (BAK Free)  1 drop Both Eyes QHS  . triamcinolone ointment  1 application Topical BID  . DISCONTD: aspirin  81 mg Oral Daily  . DISCONTD: benazepril-hydrochlorthiazide  2 tablet Oral Daily  . DISCONTD: ENSURE  237 mL Oral BID  . DISCONTD: fentaNYL  50 mcg  Transdermal Q72H  . DISCONTD: heparin  5,000 Units Subcutaneous Q8H  . DISCONTD: simvastatin  20 mg Oral q1800   Continuous Infusions:  PRN Meds:.acetaminophen, ALPRAZolam, bisacodyl, cyclobenzaprine, docusate sodium, HYDROcodone-acetaminophen, ondansetron (ZOFRAN) IV, ondansetron, DISCONTD: HYDROCODONE-APAP-NUTRIT SUPP Assessment/Plan:  SYNCOPE:  - Patient continues to be monitored under Telemetry.  -Test results for Cardiac Enzymes x 3, ProBNP, Total bili, TSH, CBC with differential, Orthostatic Hypotension, and CMP ordered on admission were WNL or non-contributory. An MRI without contrast ( per Dr. Elita Boone) will be ordered today to rule out seizures due to intracranial masses. A Neurology consult will be placed to pursue further investigation of seizure activities via EEG.  -Vasovagal syncope remains a probable cause of her loss of consciousness (LOC). Patient will be advised to avoid situations and activities that are known to trigger a vasovagal response in her like a rapid rise from supine or sitting to standing position and limiting straining with defecation and urination. Family counseling would be advised to reduce the emotional stress she is under.  -Alternatives to her anti-hypertensive regimen HCTZ in particular, and her chronic pain medications Fentanyl and Hydrocodone will be investigated to minimize the probability of drug induced syncope  HIV INFECTION: -CD4 count of 1210 on 06/15/2011; Viral Load < 40 -Will continue Atripla  GLAUCOMA NOS: -Continue Travatan  HYPERTENSION: -No orthostatic hypotension. -Continue Benazepril and HCTZ pending drug review  CHRONIC PAIN SYNDROME: -Continue Flexeril, Vicodin, and Fentanyl at home doses pending drug review.   LOS: 1 day   This is a Psychologist, occupational Note.  The care of the patient was discussed with Dr. Lyn Hollingshead and the assessment and plan formulated with their assistance.  Please see their attached note for official  documentation of the daily encounter.  Thornton Papas 07/22/2011, 12:01 PM  Resident Co-sign Daily Note: I have seen the patient and reviewed the daily progress note by Allyson Sabal MS 4 and discussed the care of the patient with them.  See below for documentation of my findings, assessment, and plans.  Subjective: [name] feels little better this morning. Has had 1 possible syncopal spell after she woke up- but had no vitals abnormalities or telemetry changes. She has had no chest pain, short of breath, abdominal pain, nausea or vomiting .  She has no bowel movements for past 2 days.   Objective: Vital signs in last 24 hours: Filed Vitals:   07/21/11 2134  07/22/11 0312 07/22/11 0526 07/22/11 0620  BP: 147/54 108/53  109/66  Pulse: 73 62  67  Temp: 97.3 F (36.3 C) 97.6 F (36.4 C)  98.1 F (36.7 C)  TempSrc: Oral     Resp: 18 18  18   Height:      Weight:   125 lb 9.6 oz (56.972 kg)   SpO2: 99% 99%  97%   Physical Exam: General: resting in bed comfortably HEENT: PERRL, EOMI, no scleral icterus Cardiac: RRR, no rubs, 2 x 6 systolic regurgitation murmur more at apex. Pulm: clear to auscultation bilaterally, moving normal volumes of air Abd: soft, nontender, nondistended, BS present Ext: warm and well perfused, no pedal edema Neuro: alert and oriented X3, cranial nerves II-XII grossly intact   Lab Results: Reviewed and documented in Electronic Record Micro Results: Reviewed and documented in Electronic Record Studies/Results: Reviewed and documented in Electronic Record Medications: I have reviewed the patient's current medications. Scheduled Meds:   . aspirin EC  81 mg Oral Daily  . atorvastatin  10 mg Oral q1800  . benazepril  40 mg Oral Daily  . efavirenz-emtrictabine-tenofovir  1 tablet Oral QHS  . feeding supplement  237 mL Oral BID  . fentaNYL  50 mcg Transdermal Q72H  . hydrochlorothiazide  25 mg Oral Daily  . pneumococcal 23 valent vaccine  0.5 mL  Intramuscular Tomorrow-1000  . sodium chloride  3 mL Intravenous Q12H  . Travoprost (BAK Free)  1 drop Both Eyes QHS  . triamcinolone ointment  1 application Topical BID  . DISCONTD: aspirin  81 mg Oral Daily  . DISCONTD: benazepril-hydrochlorthiazide  2 tablet Oral Daily  . DISCONTD: ENSURE  237 mL Oral BID  . DISCONTD: fentaNYL  50 mcg Transdermal Q72H  . DISCONTD: heparin  5,000 Units Subcutaneous Q8H  . DISCONTD: simvastatin  20 mg Oral q1800   Continuous Infusions:  PRN Meds:.acetaminophen, ALPRAZolam, bisacodyl, cyclobenzaprine, docusate sodium, HYDROcodone-acetaminophen, ondansetron (ZOFRAN) IV, ondansetron, DISCONTD: HYDROCODONE-APAP-NUTRIT SUPP Assessment/Plan:   # Syncope: Unclear etiology. Vasovagal vs. seizure vs. Cardiac causes. No changes in telemetry overnight. Also no changes while possible presyncopal events per patient. We'll monitor more 24 hours.  - Cardiac enzymes x3 negative, no electrolyte abnormalities, TSH normal, BNP normal, EKG unrevealing. - Check MRI brain to rule out any obvious pathology for question of epilepsy- if that is negative, will consider outpatient EEG per neurology. - If all workup comes back negative- will be considered for loop recorder per cardiologist the patient- but before that is to be just her pain medication regimen along with antihypertensives which might be the prime offender at present.  # HIV INFECTION: Continue Atripla # GLAUCOMA  : Continue Travatan # HYPERTENSION: Continue home meds for now. # Chronic pain syndrome:  continue home medications for now.   LOS: 1 day   Corin Tilly 07/22/2011, 1:57 PM

## 2011-07-22 NOTE — Progress Notes (Signed)
Pt. Alarmed asystole. When checked on patient, she was sitting in bed, watching TV completely asymptomatic. When looking at the strip, there was some artifact in the leads before she rang asystole. Pt. Stated she wanted to go for a walk because that's when her symptoms usually started. After walking the halls twice, she felt fine and there were no alarms on the heart monitor. Will continue to monitor patient.

## 2011-07-22 NOTE — Progress Notes (Signed)
C/o of dizziness and strange feeling across back of head and neck.  No change noted on telemetry monitor  Vital signs 134/70-74-18, 98%.    Remains alert and oriented.  No nausea or vomiting noted. PEARL.  Denies blurry vision.

## 2011-07-23 ENCOUNTER — Observation Stay (HOSPITAL_COMMUNITY): Payer: Medicare Other

## 2011-07-23 DIAGNOSIS — R55 Syncope and collapse: Secondary | ICD-10-CM | POA: Diagnosis not present

## 2011-07-23 MED ORDER — TRAVOPROST (BAK FREE) 0.004 % OP SOLN: 1.0000 [drp] | Freq: Every day | OPHTHALMIC | Status: DC

## 2011-07-23 MED ORDER — SENNOSIDES-DOCUSATE SODIUM 8.6-50 MG PO TABS: 2.0000 | ORAL_TABLET | Freq: Two times a day (BID) | ORAL | Status: DC

## 2011-07-23 MED ORDER — POLYETHYLENE GLYCOL 3350 17 G PO PACK
17.0000 g | PACK | Freq: Once | ORAL | Status: AC
  Administered 2011-07-23: 17 g via ORAL
  Filled 2011-07-23: qty 1

## 2011-07-23 MED ORDER — GADOBENATE DIMEGLUMINE 529 MG/ML IV SOLN
12.0000 mL | Freq: Once | INTRAVENOUS | Status: AC
  Administered 2011-07-23: 12 mL via INTRAVENOUS

## 2011-07-23 NOTE — Progress Notes (Signed)
Subjective: Patient feels at baseline. Has had no syncopal spells overnight. No events on telemetry monitor noted. Denies any chest pain, shortness of breath, abdominal pain, nausea, vomiting. Trying to drink water as much as she can- although doesn't drink much at home. Needs something more to help with constipation.  Objective: Vital signs in last 24 hours: Filed Vitals:   07/22/11 0620 07/22/11 1430 07/22/11 2250 07/23/11 0500  BP: 109/66 106/50 122/48 133/57  Pulse: 67 65 62 75  Temp: 98.1 F (36.7 C) 98.5 F (36.9 C) 97.7 F (36.5 C) 98.5 F (36.9 C)  TempSrc:  Oral Oral Oral  Resp: 18 20 18 18   Height:      Weight:    127 lb (57.607 kg)  SpO2: 97% 97% 97% 100%   Weight change: 10 lb 13.1 oz (4.907 kg)  Intake/Output Summary (Last 24 hours) at 07/23/11 0838 Last data filed at 07/23/11 0836  Gross per 24 hour  Intake   1086 ml  Output   2325 ml  Net  -1239 ml   Physical Exam:  General: resting in bed comfortably  HEENT: PERRL, EOMI, no scleral icterus  Cardiac: RRR, no rubs, 2 x 6 systolic regurgitation murmur more at apex.  Pulm: clear to auscultation bilaterally, moving normal volumes of air  Abd: soft, nontender, nondistended, BS present  Ext: warm and well perfused, no pedal edema  Neuro: alert and oriented X3, cranial nerves II-XII grossly intact  Lab Results: Basic Metabolic Panel:  Lab 07/21/11 1610  NA 136  K 4.0  CL 105  CO2 19  GLUCOSE 89  BUN 18  CREATININE 0.53  CALCIUM 9.1  MG --  PHOS --   Liver Function Tests:  Lab 07/21/11 1634  AST 24  ALT 9  ALKPHOS 88  BILITOT 0.1*  PROT 7.3  ALBUMIN 3.6   CBC:  Lab 07/21/11 1634  WBC 8.3  NEUTROABS 2.4  HGB 12.4  HCT 35.4*  MCV 95.2  PLT 303   Cardiac Enzymes:  Lab 07/22/11 0520 07/22/11 0016 07/21/11 2021  CKTOTAL 60 65 69  CKMB 1.6 1.6 1.5  CKMBINDEX -- -- --  TROPONINI <0.30 <0.30 <0.30   BNP:  Lab 07/21/11 2021  PROBNP 21.0    Lab 07/21/11 1634  TSH 1.915  T4TOTAL  --  FREET4 --  T3FREE --  THYROIDAB --    Medications: I have reviewed the patient's current medications. Scheduled Meds:   . aspirin EC  81 mg Oral Daily  . atorvastatin  10 mg Oral q1800  . benazepril  40 mg Oral Daily  . efavirenz-emtrictabine-tenofovir  1 tablet Oral QHS  . feeding supplement  237 mL Oral BID  . fentaNYL  50 mcg Transdermal Q72H  . hydrochlorothiazide  25 mg Oral Daily  . pneumococcal 23 valent vaccine  0.5 mL Intramuscular Tomorrow-1000  . polyethylene glycol  17 g Oral Once  . sodium chloride  3 mL Intravenous Q12H  . Travoprost (BAK Free)  1 drop Both Eyes QHS  . triamcinolone ointment  1 application Topical BID   Continuous Infusions:  PRN Meds:.acetaminophen, ALPRAZolam, bisacodyl, cyclobenzaprine, docusate sodium, HYDROcodone-acetaminophen, ondansetron (ZOFRAN) IV, ondansetron Assessment/Plan:  # Syncope: Unclear etiology. Vasovagal vs. seizure vs. Cardiac causes.  No changes in telemetry overnight. Also no changes while possible presyncopal events per patient on 5/11. MRI brain today- will help D/C planning.  - Cardiac enzymes x3 negative, no electrolyte abnormalities, TSH normal, BNP normal, EKG unrevealing.  - Check MRI brain to  rule out any obvious pathology for question of epilepsy- if that is negative, will consider outpatient EEG per neurology.  - If all workup comes back negative- will be considered for loop recorder per cardiologist the patient- but before that is to be just her pain medication regimen along with antihypertensives which might be the prime offender at present.   # HIV INFECTION: Continue Atripla   # GLAUCOMA : Continue Travatan   # HYPERTENSION: Continue home meds for now.   # Chronic pain syndrome: continue home medications for now.    LOS: 2 days   Alesana Magistro 07/23/2011, 8:38 AM

## 2011-07-23 NOTE — Discharge Summary (Signed)
DISCHARGE SUMMARY  Patient Name:  Jessica [name]  MRN: [id number]  PCP: No primary provider on file.  DOB:  1940/06/15     CSN NUMBER :   Date of Admission:  07/21/2011  Date of Discharge:  07/23/2011      Attending Physician: Dr. [name], MD         DISCHARGE DIAGNOSES: 1.  *Syncope 2.  HIV INFECTION 3.  GLAUCOMA NOS 4.  HYPERTENSION 5.  Chronic pain syndrome  DISPOSITION AND FOLLOW-UP: Jessica [name] is to follow-up with Dr. Maurice March in ID clinic , at which time, please reassess her Syncope spells timeline and also make sure she has had EEG. She is instructed not to drive until she gets EEG and sees Dr. Maurice March or a physician in ID clinic. Also assess for need of changing pain medications and Loop Recorder as out- patient- if EEG not helpful.   Discharge Orders    Future Orders Please Complete By Expires   Diet general      Increase activity slowly      Driving Restrictions      Comments:   Until you get EEG and see Dr. Maurice March or his partner in Clinic.   Call MD for:  persistant nausea and vomiting      Call MD for:  severe uncontrolled pain      Call MD for:  persistant dizziness or light-headedness          DISCHARGE MEDICATIONS: Medication List  As of 07/23/2011  1:00 PM   STOP taking these medications         docusate sodium 100 MG capsule         TAKE these medications         acetaminophen 500 MG tablet   Commonly known as: TYLENOL   Take 500 mg by mouth every 4 (four) hours as needed. For pain      acyclovir 400 MG tablet   Commonly known as: ZOVIRAX   Take 1 tablet (400 mg total) by mouth 3 (three) times daily. For 7 days as needed for herpes outbreak      ALPRAZolam 1 MG tablet   Commonly known as: XANAX   Take 1 mg by mouth 2 (two) times daily.      aspirin 81 MG EC tablet   Take 81 mg by mouth daily.      benazepril-hydrochlorthiazide 20-12.5 MG per tablet   Commonly known as: LOTENSIN HCT   Take 2 tablets by mouth daily.        cyclobenzaprine 10 MG tablet   Commonly known as: FLEXERIL   Take 10 mg by mouth 3 (three) times daily as needed. For spasms      efavirenz-emtrictabine-tenofovir 600-200-300 MG per tablet   Commonly known as: ATRIPLA   Take 1 tablet by mouth at bedtime.      ENSURE   Take 237 mLs by mouth 2 (two) times daily.      fentaNYL 50 MCG/HR   Commonly known as: DURAGESIC - dosed mcg/hr   Place 1 patch (50 mcg total) onto the skin every 3 (three) days.      HYDROCODONE-APAP-NUTRIT SUPP 10-650 MG Misc   Take 1 tablet by mouth every 6 (six) hours as needed. For pain      lovastatin 40 MG tablet   Commonly known as: MEVACOR   Take 1 tablet (40 mg total) by mouth at bedtime.      senna-docusate 8.6-50 MG per tablet  Commonly known as: Senokot-S   Take 2 tablets by mouth 2 (two) times daily.      Travoprost (BAK Free) 0.004 % Soln ophthalmic solution   Commonly known as: TRAVATAN   Place 1 drop into both eyes at bedtime.      triamcinolone ointment 0.1 %   Commonly known as: KENALOG   Apply 1 application topically 2 (two) times daily.             CONSULTS:       PROCEDURES PERFORMED:  Mr Lodema Pilot Contrast  07/23/2011  *RADIOLOGY REPORT*  Clinical Data: Syncopal episodes and possible epilepsy.  MRI HEAD WITHOUT AND WITH CONTRAST  Technique:  Multiplanar, multiecho pulse sequences of the brain and surrounding structures were obtained according to standard protocol without and with intravenous contrast  Contrast: 12mL MULTIHANCE GADOBENATE DIMEGLUMINE 529 MG/ML IV SOLN  Comparison: CT head without contrast 04/08/2009.  Findings: No acute infarct, hemorrhage, or mass lesion is present. The ventricles are of normal size.  There is no significant extra- axial fluid collection.  No significant white matter disease is present.  Flow is present in the major intracranial arteries.  The globes and orbits are intact.  Dedicated imaging of the temporal lobes demonstrates symmetric size  and signal of the hippocampal structures bilaterally.  The postcontrast images demonstrate no pathologic enhancement.  IMPRESSION: Negative MRI of the head.  Original Report Authenticated By: Jamesetta Orleans. MATTERN, M.D.       ADMISSION DATA: H&P: Pt is a pleasant 71 yo woman with PMH of HIV infection, Chronic lumbar pain, depression, HTN, constipation who is admitted to the hospital due to recurrent Syncopal spells for past 3 months.  She had her first syncope in 04/2011 which was apparently triggered by emotional reaction from her Grandson's incarceration. She felt nauseated and had sweating and passed out. Unclear how long she lost consciousness.  She denies any palpitations with that spell or any subsequent spells.  The other severe episode she had was in 06/2011 when she again had a emotional trigger, related to another incarceration episode of her grandson, and the syncope episode had similar presentation at that time.  Apart from above 2 spells, she had other mild episodes, about 10 in total- 1 of them while she was driving. But these were apparently without any specific trigger. She did not totally pass out during these spells.  Also does not describe any palpitations. Though she does have vision changes in terms of blurred vision with the spells.  She does not have weakness or numbness prior to episodes or has any severe headaches.  Does not have a history of migraine headaches.  Of note- patient lost about 14 pounds weight in past 5 months- unintentional. Also has intermittent night sweats. But no fevers.  She has baseline decreased appetite and does not drink enough liquid everyday.  She does have chronic constipation and view of her mild syncope spells are correlated with straining at stool. But she never passed out during those episodes.  She denies any chest pain, shortness of breath, abdominal pain, diarrhea, no urinary abnormalities, no burning micturition.   Physical Exam: Blood  pressure 146/70, pulse 77, temperature 98.5 F (36.9 C), temperature source Oral, resp. rate 20, height 5\' 7"  (1.702 m), weight 116 lb 2.9 oz (52.7 kg), SpO2 97.00%.  Constitutional: Vital signs reviewed. Patient is cachectic, in no acute distress and cooperative with exam. Alert and oriented x3.  Head: Normocephalic and atraumatic  Mouth: no  erythema or exudates, MMM  Eyes: PERRL, EOMI, conjunctivae normal, No scleral icterus.  Neck: Supple, Trachea midline normal ROM, mild JVD, No mass, thyromegaly, or carotid bruit present.  Cardiovascular: RRR, S1 normal, S2 normal, 2/6 systolic murmur at apex, pulses symmetric and intact bilaterally  Pulmonary/Chest: CTAB, no wheezes, rales, or rhonchi  Abdominal: Soft. Non-tender, non-distended, bowel sounds are normal, no masses, organomegaly, or guarding present.  Musculoskeletal: No joint deformities, erythema, or stiffness, ROM full and no nontender  Neurological: A&O x3, Strenght is normal and symmetric bilaterally, cranial nerve II-XII are grossly intact, no focal motor deficit, sensory intact to light touch bilaterally, no dysdiadochokinesia, Romberg sign is negative.  Skin: Warm, dry and intact. No rash, cyanosis, or clubbing.  Psychiatric: Normal mood and affect. speech and behavior is normal. Judgment and thought content normal. Cognition and memory are normal.   Labs:Basic Metabolic Panel:   Basename  07/21/11 1634   NA  136   K  4.0   CL  105   CO2  19   GLUCOSE  89   BUN  18   CREATININE  0.53   CALCIUM  9.1   MG  --   PHOS  --    Liver Function Tests:   Basename  07/21/11 1634   AST  24   ALT  9   ALKPHOS  88   BILITOT  0.1*   PROT  7.3   ALBUMIN  3.6     CBC:   Basename  07/21/11 1634   WBC  8.3   NEUTROABS  2.4   HGB  12.4   HCT  35.4*   MCV  95.2   PLT  303       HOSPITAL COURSE:  # Syncope: Unclear etiology. Vasovagal vs. seizure vs. Cardiac etiology.  No changes in telemetry for about 40 hours in  hospital. Tele strips reviewed before discharge. Also no changes in tele and vitals while possible presyncopal events per patient on 5/11.  MRI brain w/ and wo contrast negative for any brain pathology ( for epilepsy/seizure ) - Cardiac enzymes x3 negative, no electrolyte abnormalities, TSH normal- 1.95, BNP normal- 21, EKG NSR.  - Will arrange for outpatient EEG - and call patient with date and time. Not able to do before D/C as office closed on weekends. - Will hold off full Neuro Consult for now and let Dr. Maurice March decide on that. - All workup negative till date - consider for out- pt loop recorder per cardiology. - but before that adjusting her pain medication regimen along with antihypertensives which should be considered. Especially as patient is on Fentanyl 35mcg/hr and Vicodin 5-325 as needed. Pt instructed to take Vicodin only if needed for breakthrough pain. Also general instruction/education about increased fluid intake and adequate diet and getting out of bed slowly and also not driving until she gets EEG and sees Dr. Maurice March. Pt verbalized understanding  # HIV INFECTION: Continue Atripla per ID  # GLAUCOMA : Continue Travatan- prescription sent to pharmacy  # HYPERTENSION: Continue lotensin- HCTZ, although decreasing dose or changing meds might be considered as out pt if felt needed.  # Chronic pain syndrome: continue home medications for now. Please consider changing pain meds for possible precipitation of syncopal spells.   DISCHARGE DATA: Vital Signs: BP 133/57  Pulse 75  Temp(Src) 98.5 F (36.9 C) (Oral)  Resp 18  Ht 5\' 7"  (1.702 m)  Wt 127 lb (57.607 kg)  BMI 19.89 kg/m2  SpO2 100%  Labs:    Signed: Lyn Hollingshead, MD PGY 2, Internal Medicine Resident 07/23/2011, 1:00 PM

## 2011-07-23 NOTE — Discharge Instructions (Signed)
Please follow up with appointment with Dr. Maurice March. ID clinic will call you with appointment day and time. Also will set up EEG ( brain electric monitoring) set up in Neurology office sometime next week and will call you with day and time for that. Also as we discussed, please avoid driving at least until you see Dr. Maurice March and get EEG to rule out Seizures/Epilepsy. Meanwhile keep taking all medications as you are supposed to and try to take vicodin only if needed for breakthrough pain relief as Fentanyl patch will give you 24 hours pain control. Also drink as much fluid as you can and also try to eat more and let us know if we can help with any of that.

## 2011-07-24 ENCOUNTER — Telehealth: Payer: Self-pay | Admitting: Pediatrics

## 2011-07-26 NOTE — Progress Notes (Signed)
Utilization review completed.  

## 2011-07-27 ENCOUNTER — Inpatient Hospital Stay: Payer: Medicare Other | Admitting: Infectious Diseases

## 2011-07-28 ENCOUNTER — Encounter: Payer: Self-pay | Admitting: Infectious Diseases

## 2011-07-28 ENCOUNTER — Ambulatory Visit (INDEPENDENT_AMBULATORY_CARE_PROVIDER_SITE_OTHER): Payer: Medicare Other | Admitting: Infectious Diseases

## 2011-07-28 VITALS — BP 167/74 | HR 90 | Temp 98.0°F | Wt 130.0 lb

## 2011-07-28 DIAGNOSIS — B20 Human immunodeficiency virus [HIV] disease: Secondary | ICD-10-CM | POA: Diagnosis not present

## 2011-07-28 DIAGNOSIS — R55 Syncope and collapse: Secondary | ICD-10-CM | POA: Diagnosis not present

## 2011-07-28 NOTE — Progress Notes (Signed)
Patient ID: Jessica [name], female   DOB: [birth year], 71 y.o.   MRN: [id number] Followup Visit    Ms Jessica [name] was admitted over last weekend, 5/10-12/13 at [facility] and was evaluated for recurrent syncope. Laboratory tests excluded physiologic causes of syncope and cardiac monitoring revealed no arrhythmias or ischemic changes. MRI or brain was normal and EEG as outpatient is scheduled for next week. She had some orthostatic changes in AM in hospital and Fentenyl patch was d/c'd and she has had not further episodes.  Exam: Ms Jessica [name] is in no distress and feels well this AM.  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 Will continue present regimen and f/u HIV in 4 months. Instructed Jessica [name] to call for results of EEG. I will be away from 5/23-6/7 and my ID partners will cover for me.   [name]

## 2011-08-01 ENCOUNTER — Ambulatory Visit (HOSPITAL_COMMUNITY)
Admission: RE | Admit: 2011-08-01 | Discharge: 2011-08-01 | Disposition: A | Discharge status: Home / Self Care | Payer: Medicare Other | Source: Ambulatory Visit | Attending: Infectious Diseases | Admitting: Infectious Diseases

## 2011-08-01 DIAGNOSIS — R404 Transient alteration of awareness: Secondary | ICD-10-CM | POA: Insufficient documentation

## 2011-08-01 DIAGNOSIS — R569 Unspecified convulsions: Secondary | ICD-10-CM | POA: Diagnosis not present

## 2011-08-02 NOTE — Procedures (Signed)
EEG NUMBER:  [id number].  This routine EEG was requested in a 71 year old woman who has had recurrent episodes of loss of consciousness for several months.  She has had episodes of brief confusion that that was followed by a loss of consciousness.  Her medications include Xanax.  The EEG is done with the patient awake and drowsy.  During periods of maximal wakefulness, she had a moderately regulated, moderately sustained 9-10 cycle per second posterior dominant rhythm that  attenuated with eye opening and was symmetric.  Background activities were composed mainly of low amplitude moderately organized beta activities that were frontally dominant and symmetric.  Photic stimulation did produce a symmetric driving response. Hyperventilation for 3 minutes with good effort.  Did not markedly change the tracing. The patient did become drowsy as characterized by attenuation of the alpha rhythm and muscle activity.  There was diffuse slower theta and delta activities with low amplitude.  There were sharp transients seen both in the left and right temporal regions.  These did not appear to break the underlying rhythm.  CLINICAL INTERPRETATION:  This routine EEG done with the patient awake and drowsy is normal.  Bitemporal independent sharp transients are of unknown significance.  A sleep-deprived EEG may provide additional information.  Clinical correlation is advised.          ______________________________ Denton Meek, MD    XB:JYNW D:  08/02/2011 06:39:58  T:  08/02/2011 07:15:57  Job #:  [id number]

## 2011-08-08 ENCOUNTER — Other Ambulatory Visit: Payer: Self-pay | Admitting: *Deleted

## 2011-08-08 DIAGNOSIS — R52 Pain, unspecified: Secondary | ICD-10-CM

## 2011-08-08 MED ORDER — FENTANYL 100 MCG/HR TD PT72: 1.0000 | MEDICATED_PATCH | TRANSDERMAL | Status: DC

## 2011-08-08 NOTE — Telephone Encounter (Signed)
I printed Rx for 5 patches of her fentanyl. She needs 10 per month. I destroyed the one for 5 in front of Tamika Y, CMA. Pt stated she will pick it up this Thursday

## 2011-08-09 ENCOUNTER — Telehealth: Payer: Self-pay | Admitting: *Deleted

## 2011-08-09 DIAGNOSIS — M5126 Other intervertebral disc displacement, lumbar region: Secondary | ICD-10-CM

## 2011-08-09 DIAGNOSIS — R52 Pain, unspecified: Secondary | ICD-10-CM

## 2011-08-09 DIAGNOSIS — G894 Chronic pain syndrome: Secondary | ICD-10-CM

## 2011-08-09 MED ORDER — FENTANYL 50 MCG/HR TD PT72: 1.0000 | MEDICATED_PATCH | TRANSDERMAL | Status: DC

## 2011-08-09 NOTE — Telephone Encounter (Addendum)
Patient called and reported she has another dizzy spell on Monday Aug 07, 2011 and this on was worse than before. She said it left her weak, disoriented and it was hard for her to focus her eyes. She had an EEG on Aug 01, 2011 and wants to know what it found if anything. Advised her Dr Maurice March is out of the office until next week and will have another provider review her EEG and give her a call back. She gave me a new mobile number [phone] to add to her chart.  Spoke with Dr Daiva Eves about the patient and after we reviewed her EEG was told to refer her to a Cardiologist and a let her know that her EEG was fine.   Also told to discontinue her Fentanyl 100 mcg/hr Rx and reissue it at 50 mcg/hr as her hospital note advised the change as it may be a cause her blackouts and she left the hospital with that dose.  Will call the patient back and inform her of the change in med strength and of the referral to cardiology.  Spoke with patient and she understands the referral and the medication.

## 2011-08-15 NOTE — Telephone Encounter (Signed)
Encounter opened to check when the last time medication we refilled should have been a chart opened not a phone note.

## 2011-08-24 ENCOUNTER — Other Ambulatory Visit: Payer: Self-pay | Admitting: *Deleted

## 2011-08-24 DIAGNOSIS — R55 Syncope and collapse: Secondary | ICD-10-CM

## 2011-08-29 ENCOUNTER — Telehealth: Payer: Self-pay | Admitting: *Deleted

## 2011-08-29 NOTE — Telephone Encounter (Signed)
Opened in error was trying to open an encounter to check on the referral to Cardiology for this patient.

## 2011-09-04 ENCOUNTER — Ambulatory Visit (INDEPENDENT_AMBULATORY_CARE_PROVIDER_SITE_OTHER): Payer: Medicare Other | Admitting: Cardiovascular Disease

## 2011-09-04 ENCOUNTER — Encounter: Payer: Self-pay | Admitting: Cardiovascular Disease

## 2011-09-04 VITALS — BP 141/78 | HR 78 | Wt 124.0 lb

## 2011-09-04 DIAGNOSIS — I714 Abdominal aortic aneurysm, without rupture, unspecified: Secondary | ICD-10-CM

## 2011-09-04 DIAGNOSIS — I739 Peripheral vascular disease, unspecified: Secondary | ICD-10-CM | POA: Diagnosis not present

## 2011-09-04 DIAGNOSIS — R55 Syncope and collapse: Secondary | ICD-10-CM

## 2011-09-04 DIAGNOSIS — I10 Essential (primary) hypertension: Secondary | ICD-10-CM

## 2011-09-04 DIAGNOSIS — B20 Human immunodeficiency virus [HIV] disease: Secondary | ICD-10-CM | POA: Diagnosis not present

## 2011-09-04 DIAGNOSIS — G894 Chronic pain syndrome: Secondary | ICD-10-CM
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
## 2011-09-04 NOTE — Assessment & Plan Note (Signed)
Doubt cardiac etiology.  F/U stress echo.  F/U carotid duplex since she has known vascular disease and MRA was not done.

## 2011-09-04 NOTE — Assessment & Plan Note (Signed)
F/U Dr Maurice March continue antiretro viral

## 2011-09-04 NOTE — Addendum Note (Signed)
Addended by: Scherrie Bateman E on: 09/04/2011 02:53 PM   Modules accepted: Orders

## 2011-09-04 NOTE — Patient Instructions (Addendum)
You have been referred to  EP FOR  SYNCOPE Your physician recommends that you continue on your current medications as directed. Please refer to the Current Medication list given to you today. Your physician has requested that you have a stress echocardiogram. For further information please visit [email or web address] Please follow instruction sheet as given. DX SYNCOPE  Your physician has requested that you have an abdominal aorta duplex. During this test, an ultrasound is used to evaluate the aorta. Allow 30 minutes for this exam. Do not eat after midnight the day before and avoid carbonated beverages  DX AAA  Your physician has requested that you have a lower or upper extremity arterial duplex. This test is an ultrasound of the arteries in the legs or arms. It looks at arterial blood flow in the legs and arms. Allow one hour for Lower and Upper Arterial scans. There are no restrictions or special instructions  LOWER  DX PVD

## 2011-09-04 NOTE — Assessment & Plan Note (Signed)
I agree with IM I think various pain meds and narcotics have a lot to do with her "syncope: especially with recent weight loss "effective" doses are stronger

## 2011-09-04 NOTE — Assessment & Plan Note (Signed)
Well controlled.  Continue current medications and low sodium Dash type diet.   No postural changes on exam

## 2011-09-07 ENCOUNTER — Other Ambulatory Visit: Payer: Self-pay | Admitting: *Deleted

## 2011-09-07 DIAGNOSIS — L309 Dermatitis, unspecified: Secondary | ICD-10-CM

## 2011-09-07 DIAGNOSIS — G894 Chronic pain syndrome: Secondary | ICD-10-CM

## 2011-09-07 DIAGNOSIS — M5126 Other intervertebral disc displacement, lumbar region: Secondary | ICD-10-CM

## 2011-09-07 MED ORDER — TRIAMCINOLONE ACETONIDE 0.1 % EX OINT: 1.0000 "application " | TOPICAL_OINTMENT | Freq: Two times a day (BID) | CUTANEOUS | Status: DC

## 2011-09-07 MED ORDER — FENTANYL 50 MCG/HR TD PT72: 1.0000 | MEDICATED_PATCH | TRANSDERMAL | Status: DC

## 2011-09-07 NOTE — Progress Notes (Signed)
Patient ID: Jessica [name], female   DOB: May 24, 1940, 71 y.o.   MRN: [id number] [name] will be admitted for recurrent syncope. [name]

## 2011-09-08 ENCOUNTER — Ambulatory Visit (HOSPITAL_COMMUNITY): Payer: Medicare Other | Attending: Internal Medicine

## 2011-09-08 ENCOUNTER — Encounter: Payer: Self-pay | Admitting: Cardiovascular Disease

## 2011-09-08 ENCOUNTER — Ambulatory Visit (HOSPITAL_COMMUNITY): Payer: Medicare Other | Attending: Internal Medicine | Admitting: Radiology

## 2011-09-08 ENCOUNTER — Other Ambulatory Visit: Payer: Self-pay | Admitting: *Deleted

## 2011-09-08 DIAGNOSIS — R5381 Other malaise: Secondary | ICD-10-CM | POA: Insufficient documentation

## 2011-09-08 DIAGNOSIS — R42 Dizziness and giddiness: Secondary | ICD-10-CM | POA: Diagnosis not present

## 2011-09-08 DIAGNOSIS — L309 Dermatitis, unspecified: Secondary | ICD-10-CM

## 2011-09-08 DIAGNOSIS — R55 Syncope and collapse: Secondary | ICD-10-CM | POA: Diagnosis not present

## 2011-09-08 DIAGNOSIS — R0989 Other specified symptoms and signs involving the circulatory and respiratory systems: Secondary | ICD-10-CM

## 2011-09-08 DIAGNOSIS — R52 Pain, unspecified: Secondary | ICD-10-CM

## 2011-09-08 MED ORDER — HYDROCODONE-ACETAMINOPHEN 10-650 MG PO TABS: 1.0000 | ORAL_TABLET | Freq: Two times a day (BID) | ORAL | Status: DC | PRN

## 2011-09-08 MED ORDER — TRIAMCINOLONE ACETONIDE 0.1 % EX OINT: 1.0000 "application " | TOPICAL_OINTMENT | Freq: Two times a day (BID) | CUTANEOUS | Status: DC

## 2011-09-08 NOTE — Progress Notes (Signed)
Patient ID: Jessica [name], female   DOB: 05/07/1940, 71 y.o.   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 She is adherent to her Atripla and has had no fevers or weight loss. Exam   Blood pressure 186/79, pulse 70, temperature 98.1 F (36.7 C), weight 129 lb (58.514 kg). Repeat BP is 144/80 and always element of anxiety. No thrush, rashes or adenopathy. Assessment and Plan HIV suppressed and routine f/u om 4-5 ,months. Jessica [name]

## 2011-09-08 NOTE — Progress Notes (Signed)
Stress Echocardiogram performed.  

## 2011-09-12 ENCOUNTER — Ambulatory Visit (INDEPENDENT_AMBULATORY_CARE_PROVIDER_SITE_OTHER): Payer: Medicare Other | Admitting: Internal Medicine

## 2011-09-12 ENCOUNTER — Encounter: Payer: Self-pay | Admitting: Internal Medicine

## 2011-09-12 ENCOUNTER — Encounter (INDEPENDENT_AMBULATORY_CARE_PROVIDER_SITE_OTHER): Payer: Medicare Other

## 2011-09-12 VITALS — BP 125/70 | HR 62 | Ht 68.0 in | Wt 125.8 lb

## 2011-09-12 DIAGNOSIS — I739 Peripheral vascular disease, unspecified: Secondary | ICD-10-CM

## 2011-09-12 DIAGNOSIS — I10 Essential (primary) hypertension: Secondary | ICD-10-CM | POA: Diagnosis not present

## 2011-09-12 DIAGNOSIS — R55 Syncope and collapse: Secondary | ICD-10-CM

## 2011-09-12 DIAGNOSIS — R0989 Other specified symptoms and signs involving the circulatory and respiratory systems: Secondary | ICD-10-CM

## 2011-09-12 MED ORDER — BENAZEPRIL HCL 20 MG PO TABS: ORAL_TABLET | ORAL | Status: DC
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
## 2011-09-12 NOTE — Patient Instructions (Signed)
Your physician has recommended you make the following change in your medication:  1) Stop lotensin/hct 2) Start lotensin 20 mg one tablet by mouth twice daily.  Your physician has recommended that you wear an event monitor. Event monitors are medical devices that record the heart's electrical activity. Doctors most often [name] these monitors to diagnose arrhythmias. Arrhythmias are problems with the speed or rhythm of the heartbeat. The monitor is a small, portable device. You can wear one while you do your normal daily activities. This is usually used to diagnose what is causing palpitations/syncope (passing out).  Your physician recommends that you schedule a follow-up appointment in: 5-6 weeks with Dr. Graciela Husbands.

## 2011-09-18 ENCOUNTER — Telehealth: Payer: Self-pay | Admitting: *Deleted

## 2011-09-18 ENCOUNTER — Encounter (INDEPENDENT_AMBULATORY_CARE_PROVIDER_SITE_OTHER): Payer: Medicare Other

## 2011-09-18 DIAGNOSIS — R55 Syncope and collapse: Secondary | ICD-10-CM

## 2011-09-18 DIAGNOSIS — I6529 Occlusion and stenosis of unspecified carotid artery: Secondary | ICD-10-CM

## 2011-09-18 NOTE — Telephone Encounter (Signed)
WHILE GIVING LOWER ART  DOP RESULTS PT STATES HAS NOT HARD ANTHING RE MONITOR  AWARE WILL FORWARD NOTE TO SCHEDULERS./CY

## 2011-09-21 ENCOUNTER — Emergency Department (HOSPITAL_COMMUNITY): Payer: Medicare Other

## 2011-09-21 ENCOUNTER — Encounter (HOSPITAL_COMMUNITY): Payer: Self-pay | Admitting: *Deleted

## 2011-09-21 ENCOUNTER — Emergency Department (HOSPITAL_COMMUNITY)
Admission: EM | Admit: 2011-09-21 | Discharge: 2011-09-21 | Disposition: A | Discharge status: Home / Self Care | Payer: Medicare Other | Attending: Emergency Medicine | Admitting: Emergency Medicine

## 2011-09-21 DIAGNOSIS — M545 Low back pain, unspecified: Secondary | ICD-10-CM | POA: Insufficient documentation

## 2011-09-21 DIAGNOSIS — I10 Essential (primary) hypertension: Secondary | ICD-10-CM | POA: Diagnosis not present

## 2011-09-21 DIAGNOSIS — Z21 Asymptomatic human immunodeficiency virus [HIV] infection status: Secondary | ICD-10-CM | POA: Insufficient documentation

## 2011-09-21 DIAGNOSIS — G8929 Other chronic pain: Secondary | ICD-10-CM | POA: Insufficient documentation

## 2011-09-21 DIAGNOSIS — H409 Unspecified glaucoma: Secondary | ICD-10-CM | POA: Diagnosis not present

## 2011-09-21 DIAGNOSIS — X58XXXA Exposure to other specified factors, initial encounter: Secondary | ICD-10-CM | POA: Insufficient documentation

## 2011-09-21 DIAGNOSIS — Y93E6 Activity, residential relocation: Secondary | ICD-10-CM | POA: Insufficient documentation

## 2011-09-21 DIAGNOSIS — M129 Arthropathy, unspecified: Secondary | ICD-10-CM | POA: Diagnosis not present

## 2011-09-21 DIAGNOSIS — E785 Hyperlipidemia, unspecified: Secondary | ICD-10-CM | POA: Diagnosis not present

## 2011-09-21 DIAGNOSIS — IMO0002 Reserved for concepts with insufficient information to code with codable children: Secondary | ICD-10-CM | POA: Diagnosis not present

## 2011-09-21 DIAGNOSIS — I739 Peripheral vascular disease, unspecified: Secondary | ICD-10-CM | POA: Insufficient documentation

## 2011-09-21 DIAGNOSIS — F17200 Nicotine dependence, unspecified, uncomplicated: Secondary | ICD-10-CM | POA: Diagnosis not present

## 2011-09-21 DIAGNOSIS — M25519 Pain in unspecified shoulder: Secondary | ICD-10-CM | POA: Diagnosis not present

## 2011-09-21 DIAGNOSIS — S46911A Strain of unspecified muscle, fascia and tendon at shoulder and upper arm level, right arm, initial encounter: Secondary | ICD-10-CM

## 2011-09-21 NOTE — ED Provider Notes (Signed)
History     CSN: [id number]  Arrival date & time 09/21/11  1324   First MD Initiated Contact with Patient 09/21/11 1403      Chief Complaint  Patient presents with  . Shoulder Pain     HPI Pt was seen at 1415.  Per pt, c/o gradual onset and persistence of constant right shoulder "pain" for the past 1 week.  Pt states the pain began after she helped her sister move.  States she came to the ED for "a cortisone shot."  Denies focal motor weakness, no tingling/numbness in extremity, no CP/SOB, no abd pain, no neck pain, no direct injury, no rash, no fever.     Past Medical History  Diagnosis Date  . Glaucoma   . Arthritis   . Hypertension   . Hyperlipidemia   . SBO (small bowel obstruction) 12/25/2010  . Peripheral vascular disease   . 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 Chronic lower back pain   . Chronic pain     Past Surgical History  Procedure Date  . Eye surgery   . 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 Appendectomy   . Cholecystectomy   . Colectomy 01/2011    Dr. Jamey Ripa; "took out 12 inches of small intestiines and removed blockage"  . Abdominal hysterectomy     History  Substance Use Topics  . Smoking status: Current Everyday Smoker -- 0.5 packs/day for 50 years    Types: Cigarettes    Last Attempt to Quit: 12/24/2010  . Smokeless tobacco: Never Used   Comment: "stopped 06/2010; started back up Thanksgiving 2012"  . Alcohol Use: No     "last drink of alcohol ~ 1977"    Review of Systems ROS: Statement: All systems negative except as marked or noted in the HPI; Constitutional: Negative for fever and chills. ; ; Eyes: Negative for eye pain, redness and discharge. ; ; ENMT: Negative for ear pain, hoarseness, nasal congestion, sinus pressure and sore throat. ; ; Cardiovascular: Negative for chest pain, palpitations, diaphoresis, dyspnea and peripheral edema. ; ; Respiratory: Negative for cough, wheezing and stridor. ; ;  Gastrointestinal: Negative for nausea, vomiting, diarrhea, abdominal pain, blood in stool, hematemesis, jaundice and rectal bleeding. . ; ; Genitourinary: Negative for dysuria, flank pain and hematuria. ; ; Musculoskeletal: +shoulder pain. Negative for back pain and neck pain. Negative for swelling and trauma.; ; Skin: Negative for pruritus, rash, abrasions, blisters, bruising and skin lesion.; ; Neuro: Negative for headache, lightheadedness and neck stiffness. Negative for weakness, altered level of consciousness , altered mental status, extremity weakness, paresthesias, involuntary movement, seizure and syncope.     Allergies  Review of patient's allergies indicates no known allergies.  Home Medications   Current Outpatient Rx  Name Route Sig Dispense Refill  . ACETAMINOPHEN 500 MG PO TABS Oral Take 500 mg by mouth every 4 (four) hours as needed. For pain    . ACYCLOVIR 400 MG PO TABS Oral Take 1 tablet (400 mg total) by mouth 3 (three) times daily. For 7 days as needed for herpes outbreak 21 tablet prn  . ALPRAZOLAM 1 MG PO TABS Oral Take 1 mg by mouth 2 (two) times daily.    . ASPIRIN 81 MG PO TBEC Oral Take 81 mg by mouth daily.      Marland Kitchen BENAZEPRIL HCL 20 MG PO TABS  Take one tablet by mouth twice daily. 60 tablet 6  . CYCLOBENZAPRINE  HCL 10 MG PO TABS Oral Take 10 mg by mouth 3 (three) times daily as needed. For spasms    . EFAVIRENZ-EMTRICITAB-TENOFOVIR 600-200-300 MG PO TABS Oral Take 1 tablet by mouth at bedtime. 30 tablet 6  . ENSURE PO LIQD Oral Take 237 mLs by mouth 2 (two) times daily.    . FENTANYL 50 MCG/HR TD PT72 Transdermal Place 1 patch (50 mcg total) onto the skin every 3 (three) days. 10 patch 0  . HYDROCODONE-ACETAMINOPHEN 10-650 MG PO TABS Oral Take 1 tablet by mouth 2 (two) times daily as needed for pain. PRN 60 tablet 2  . LOVASTATIN 40 MG PO TABS Oral Take 1 tablet (40 mg total) by mouth at bedtime. 30 tablet 5  . ONE-DAILY MULTI VITAMINS PO TABS Oral Take 1 tablet by  mouth daily.    Bernadette Hoit SODIUM 8.6-50 MG PO TABS Oral Take 2 tablets by mouth 2 (two) times daily. 120 tablet 3  . TRAVOPROST (BAK FREE) 0.004 % OP SOLN Both Eyes Place 1 drop into both eyes at bedtime. 1 Bottle 0  . TRIAMCINOLONE ACETONIDE 0.1 % EX OINT Topical Apply 1 application topically 2 (two) times daily. 454 g 3    BP 170/86  Pulse 81  Temp 98.4 F (36.9 C) (Oral)  Resp 17  Ht 5' 7.75" (1.721 m)  Wt 124 lb (56.246 kg)  BMI 18.99 kg/m2  SpO2 100%  Physical Exam 1420: Physical examination:  Nursing notes reviewed; Vital signs and O2 SAT reviewed;  Constitutional: Well developed, Well nourished, Well hydrated, In no acute distress; Head:  Normocephalic, atraumatic; Eyes: EOMI, PERRL, No scleral icterus; ENMT: Mouth and pharynx normal, Mucous membranes moist; Neck: Supple, Full range of motion, No lymphadenopathy; Cardiovascular: Regular rate and rhythm, No murmur, rub, or gallop; Respiratory: Breath sounds clear & equal bilaterally, No rales, rhonchi, wheezes.  Speaking full sentences with ease, Normal respiratory effort/excursion; Chest: Nontender, Movement normal; Abdomen: Soft, Nontender, Nondistended, Normal bowel sounds; Genitourinary: No CVA tenderness; Extremities: Pulses normal,  +right shoulder w/FROM.  +TTP anterior right shoulder without specific area of point tenderness.   NT AC joint, clavicle NT, scapula NT, proximal humerus NT, biceps tendon NT over bicipital groove.  Motor strength at shoulder normal.  Sensation intact over deltoid region, distal NMS intact with right hand having intact sensation and strength in the distribution of the median, radial, and ulnar nerve function.  Strong radial pulse. No deformity, no rash, no ecchymosis, no erythema, no open wounds.  +FROM right elbow with intact motor strength biceps and triceps muscles to resistance., No edema, No calf edema or asymmetry.; Neuro: AA&Ox3, Major CN grossly intact.  Speech clear. No gross focal motor  or sensory deficits in extremities.; Skin: Color normal, Warm, Dry, no rash.   ED Course  Procedures   MDM  MDM Reviewed: nursing note, vitals and previous chart Interpretation: x-ray   Dg Shoulder Right 09/21/2011  *RADIOLOGY REPORT*  Clinical Data: 71 year old female with pain times 8 days.  RIGHT SHOULDER - 2+ VIEW  Comparison: 04/08/2009.  Findings: No glenohumeral joint dislocation.  Stable bone mineralization.  Proximal right humerus, right clavicle, and right scapula are intact.  Negative visualized right ribs and lung parenchyma.  IMPRESSION: No acute osseous abnormality identified about the right shoulder.  Original Report Authenticated By: Harley Hallmark, M.D.       2:32 PM:  Pt already taking meds for pain.  Will give sling, encourage Ortho MD f/u.  Dx testing d/w pt and  family.  Questions answered.  Verb understanding, agreeable to d/c home with outpt f/u.       Laray Anger, DO 09/23/11 1342

## 2011-09-21 NOTE — ED Notes (Signed)
Pt c/o pain with movement in her right shoulder x 8 days. Denies injury. Pt able to raise arm over her head with a little assistance but c/o pain. States that it hurts more when she is lowering her arm. Pt states that she helped her sister move a week prior to pain starting.

## 2011-09-21 NOTE — ED Notes (Addendum)
Pain rt shoulder for 8 days,  Thinks she may have hurt shoulder when moving.  Increased pain with movement.  Unable to raise arm up without help.  Wearing a heart monitor to evaluate syncope

## 2011-09-26 ENCOUNTER — Telehealth: Payer: Self-pay | Admitting: Cardiovascular Disease

## 2011-09-26 NOTE — Telephone Encounter (Signed)
SEE CAROTID RESULT NOTE./CY

## 2011-09-26 NOTE — Telephone Encounter (Signed)
F/u on previous call  Patient returning Jessica [name] call .

## 2011-09-28 ENCOUNTER — Encounter (INDEPENDENT_AMBULATORY_CARE_PROVIDER_SITE_OTHER): Payer: Medicare Other

## 2011-09-28 DIAGNOSIS — I700 Atherosclerosis of aorta: Secondary | ICD-10-CM

## 2011-09-28 DIAGNOSIS — I714 Abdominal aortic aneurysm, without rupture: Secondary | ICD-10-CM

## 2011-10-06 ENCOUNTER — Other Ambulatory Visit: Payer: Self-pay | Admitting: *Deleted

## 2011-10-06 DIAGNOSIS — G894 Chronic pain syndrome: Secondary | ICD-10-CM

## 2011-10-06 DIAGNOSIS — M5126 Other intervertebral disc displacement, lumbar region: Secondary | ICD-10-CM

## 2011-10-06 MED ORDER — FENTANYL 50 MCG/HR TD PT72
1.0000 | MEDICATED_PATCH | TRANSDERMAL | Status: DC
Start: 2011-10-06 — End: 2011-11-01

## 2011-10-20 ENCOUNTER — Other Ambulatory Visit (INDEPENDENT_AMBULATORY_CARE_PROVIDER_SITE_OTHER): Payer: Medicare Other

## 2011-10-20 ENCOUNTER — Other Ambulatory Visit: Payer: Self-pay | Admitting: Internal Medicine

## 2011-10-20 DIAGNOSIS — B20 Human immunodeficiency virus [HIV] disease: Secondary | ICD-10-CM | POA: Diagnosis not present

## 2011-10-20 LAB — CBC WITH DIFFERENTIAL/PLATELET
Basophils Absolute: 0 10*3/uL (ref 0.0–0.1)
Eosinophils Relative: 1 % (ref 0–5)
HCT: 35.2 % — ABNORMAL LOW (ref 36.0–46.0)
Lymphocytes Relative: 49 % — ABNORMAL HIGH (ref 12–46)
Lymphs Abs: 3.3 10*3/uL (ref 0.7–4.0)
MCV: 93.4 fL (ref 78.0–100.0)
Neutro Abs: 3.1 10*3/uL (ref 1.7–7.7)
Platelets: 386 10*3/uL (ref 150–400)
RBC: 3.77 MIL/uL — ABNORMAL LOW (ref 3.87–5.11)
RDW: 13 % (ref 11.5–15.5)
WBC: 6.7 10*3/uL (ref 4.0–10.5)

## 2011-10-20 LAB — COMPREHENSIVE METABOLIC PANEL
ALT: 11 U/L (ref 0–35)
AST: 20 U/L (ref 0–37)
Albumin: 4.2 g/dL (ref 3.5–5.2)
CO2: 24 mEq/L (ref 19–32)
Calcium: 9.7 mg/dL (ref 8.4–10.5)
Chloride: 109 mEq/L (ref 96–112)
Potassium: 3.9 mEq/L (ref 3.5–5.3)
Total Protein: 7.5 g/dL (ref 6.0–8.3)

## 2011-10-20 LAB — T-HELPER CELL (CD4) - (RCID CLINIC ONLY): CD4 T Cell Abs: 1190 uL (ref 400–2700)

## 2011-10-23 LAB — HIV-1 RNA QUANT-NO REFLEX-BLD: HIV-1 RNA Quant, Log: <1.3 {Log} (ref <1.30)

## 2011-10-25 ENCOUNTER — Ambulatory Visit (INDEPENDENT_AMBULATORY_CARE_PROVIDER_SITE_OTHER): Payer: Medicare Other | Admitting: Cardiovascular Disease

## 2011-10-25 ENCOUNTER — Encounter: Payer: Self-pay | Admitting: Cardiovascular Disease

## 2011-10-25 VITALS — BP 156/79 | HR 66 | Ht 68.0 in | Wt 122.0 lb

## 2011-10-25 DIAGNOSIS — I10 Essential (primary) hypertension: Secondary | ICD-10-CM

## 2011-10-26 ENCOUNTER — Ambulatory Visit (INDEPENDENT_AMBULATORY_CARE_PROVIDER_SITE_OTHER): Payer: Medicare Other | Admitting: Internal Medicine

## 2011-10-26 ENCOUNTER — Encounter: Payer: Self-pay | Admitting: Internal Medicine

## 2011-10-26 VITALS — BP 181/93 | HR 92 | Ht 68.0 in | Wt 124.4 lb

## 2011-10-26 DIAGNOSIS — R55 Syncope and collapse: Secondary | ICD-10-CM

## 2011-10-26 DIAGNOSIS — I10 Essential (primary) hypertension: Secondary | ICD-10-CM | POA: Diagnosis not present

## 2011-10-26 NOTE — Patient Instructions (Addendum)
Your physician wants you to follow-up in: 6 months with Dr. Klein. You will receive a reminder letter in the mail two months in advance. If you don't receive a letter, please call our office to schedule the follow-up appointment.  Your physician recommends that you continue on your current medications as directed. Please refer to the Current Medication list given to you today.  

## 2011-10-26 NOTE — Progress Notes (Signed)
HPI  Jessica [name] is a 71 y.o. female Seen in followup for syncope in the context of hypertension. 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 Glaucoma   . Arthritis   . Hypertension   . Hyperlipidemia   . SBO (small bowel obstruction) 12/25/2010  . Peripheral vascular disease   . 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 Chronic lower back pain   . Chronic pain     Past Surgical History  Procedure Date  . Eye surgery   . 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 Appendectomy   . Cholecystectomy   . Colectomy 01/2011    Dr. Jamey Ripa; "took out 12 inches of small intestiines and removed blockage"  . Abdominal hysterectomy     Current Outpatient Prescriptions  Medication Sig Dispense Refill  . acetaminophen (TYLENOL) 500 MG tablet Take 500 mg by mouth every 4 (four) hours as needed. For pain      . acyclovir (ZOVIRAX) 400 MG tablet Take 1 tablet (400 mg total) by mouth 3 (three) times daily. For 7 days as needed for herpes outbreak  21 tablet  prn  . ALPRAZolam (XANAX) 1 MG tablet Take 1 mg by mouth 2 (two) times daily.      Marland Kitchen aspirin 81 MG EC tablet Take 81 mg by mouth daily.        Marland Kitchen BENAZEPRIL HCL PO Take 20 mg by mouth daily.      . cyclobenzaprine (FLEXERIL) 10 MG tablet Take 10 mg by mouth 3 (three) times daily as needed. For spasms      . efavirenz-emtrictabine-tenofovir (ATRIPLA) 600-200-300 MG per tablet Take 1 tablet by mouth at bedtime.  30 tablet  6  . ENSURE (ENSURE) Take 237 mLs by mouth 2 (two) times daily.      . fentaNYL (DURAGESIC) 50 MCG/HR Place 1 patch (50 mcg total) onto the skin every 3 (three) days.  10 patch  0  . HYDROcodone-acetaminophen (LORCET) 10-650 MG per tablet Take 1 tablet by mouth 2 (two) times daily as needed for pain. PRN  60 tablet  2   . lovastatin (MEVACOR) 40 MG tablet Take 1 tablet (40 mg total) by mouth at bedtime.  30 tablet  5  . Multiple Vitamin (MULTIVITAMIN) tablet Take 1 tablet by mouth daily.      Marland Kitchen senna-docusate (SENOKOT-S) 8.6-50 MG per tablet Take 2 tablets by mouth 2 (two) times daily.  120 tablet  3  . Travoprost, BAK Free, (TRAVATAN) 0.004 % SOLN ophthalmic solution Place 1 drop into both eyes at bedtime.  1 Bottle  0  . triamcinolone ointment (KENALOG) 0.1 % Apply 1 application topically 2 (two) times daily.  454 g  3  . benazepril (LOTENSIN) 20 MG tablet Take 20 mg by mouth daily.         No Known Allergies  Review of Systems negative except from HPI and PMH  Physical Exam BP 181/93  Pulse 92  Ht 5\' 8"  (1.727 m)  Wt 124 lb 6.4 oz (56.427 kg)  BMI 18.91 kg/m2 Well developed and well nourished in no acute distress HENT normal E scleral and icterus clear Neck Supple JVP flat; carotids brisk and full Clear to ausculation  Regular rate and rhythm, no murmurs gallops or rub Soft with active bowel sounds No clubbing cyanosi s o Edema Alert and oriented, grossly normal motor and  sensory function Skin Warm and Dry    Assessment and  Plan

## 2011-10-26 NOTE — Assessment & Plan Note (Signed)
Her blood pressure is better today. Have some systolic hypertension with prolonged standing. Orthostatic hypertension is identified in patients with dysautonomia primarily diabetes I have asked her to record her blood pressures at home and follow these up with Dr. Maurice March-- PCP

## 2011-10-26 NOTE — Assessment & Plan Note (Signed)
We have reviewed the importance of recognizing the prodrome and the value of recumbency

## 2011-10-28 NOTE — Progress Notes (Signed)
The patient rescheduled before she could be seen.

## 2011-11-01 ENCOUNTER — Encounter: Payer: Self-pay | Admitting: Cardiovascular Disease

## 2011-11-01 ENCOUNTER — Other Ambulatory Visit: Payer: Self-pay | Admitting: *Deleted

## 2011-11-01 ENCOUNTER — Ambulatory Visit (INDEPENDENT_AMBULATORY_CARE_PROVIDER_SITE_OTHER): Payer: Medicare Other | Admitting: Cardiovascular Disease

## 2011-11-01 VITALS — BP 144/79 | HR 70 | Ht 68.0 in | Wt 128.0 lb

## 2011-11-01 DIAGNOSIS — I739 Peripheral vascular disease, unspecified: Secondary | ICD-10-CM | POA: Insufficient documentation

## 2011-11-01 DIAGNOSIS — M5126 Other intervertebral disc displacement, lumbar region: Secondary | ICD-10-CM

## 2011-11-01 DIAGNOSIS — G894 Chronic pain syndrome: Secondary | ICD-10-CM

## 2011-11-01 HISTORY — DX: Peripheral vascular disease, unspecified: I73.9

## 2011-11-01 MED ORDER — FENTANYL 50 MCG/HR TD PT72: 1.0000 | MEDICATED_PATCH | TRANSDERMAL | Status: DC

## 2011-11-01 NOTE — Assessment & Plan Note (Signed)
Patient has known history of PAD status post aortobifemoral bypass in 2009 by Dr. Hart Rochester. Recent duplex ultrasound showed greater than 50% stenosis in the right common femoral artery distal to the anastomosis. Clinically, the patient has no claudication or any other symptoms to suggest that this is hemodynamically significant. Also by physical exam her pulses are relatively normal. Due to all of that, no further intervention is needed at this time. I recommend an ABI and followup in one year. I had a prolonged discussion with her about the importance of smoking cessation in order to prevent progression of PAD in the future.

## 2011-11-01 NOTE — Patient Instructions (Addendum)
Your physician has requested that you have an ankle brachial index (ABI) in 1 year. During this test an ultrasound and blood pressure cuff are used to evaluate the arteries that supply the arms and legs with blood. Allow thirty minutes for this exam. There are no restrictions or special instructions.  Your physician wants you to follow-up in: 1 year.   You will receive a reminder letter in the mail two months in advance. If you don't receive a letter, please call our office to schedule the follow-up appointment.

## 2011-11-01 NOTE — Progress Notes (Signed)
HPI  This is a 71 year old African American female who was referred by Dr. Eden Emms for evaluation of peripheral vascular disease. The patient is status post aortobifemoral bypass in 2009 done by Dr. Hart Rochester. She has done well and was discharged from his clinic in 2011. She had noninvasive Doppler evaluation done in July which showed normal ABI bilaterally. Aortoiliac duplex showed patent bypass without significant stenosis. There was a greater than 50% stenosis in the right common femoral artery distal to the anastomosis. The patient reports no claudication or discomfort whatsoever even after prolonged walking. She continues to smoke. She denies any foot ulcers  No Known Allergies   Current Outpatient Prescriptions on File Prior to Visit  Medication Sig Dispense Refill  . acetaminophen (TYLENOL) 500 MG tablet Take 500 mg by mouth every 4 (four) hours as needed. For pain      . acyclovir (ZOVIRAX) 400 MG tablet Take 1 tablet (400 mg total) by mouth 3 (three) times daily. For 7 days as needed for herpes outbreak  21 tablet  prn  . ALPRAZolam (XANAX) 1 MG tablet Take 1 mg by mouth 2 (two) times daily.      Marland Kitchen aspirin 81 MG EC tablet Take 81 mg by mouth daily.        . benazepril (LOTENSIN) 20 MG tablet Take 20 mg by mouth daily.       Marland Kitchen BENAZEPRIL HCL PO Take 20 mg by mouth daily.      . cyclobenzaprine (FLEXERIL) 10 MG tablet Take 10 mg by mouth 3 (three) times daily as needed. For spasms      . efavirenz-emtrictabine-tenofovir (ATRIPLA) 600-200-300 MG per tablet Take 1 tablet by mouth at bedtime.  30 tablet  6  . ENSURE (ENSURE) Take 237 mLs by mouth 2 (two) times daily.      Marland Kitchen HYDROcodone-acetaminophen (LORCET) 10-650 MG per tablet Take 1 tablet by mouth 2 (two) times daily as needed for pain. PRN  60 tablet  2  . lovastatin (MEVACOR) 40 MG tablet Take 1 tablet (40 mg total) by mouth at bedtime.  30 tablet  5  . Multiple Vitamin (MULTIVITAMIN) tablet Take 1 tablet by mouth daily.      Marland Kitchen  senna-docusate (SENOKOT-S) 8.6-50 MG per tablet Take 2 tablets by mouth 2 (two) times daily.  120 tablet  3  . Travoprost, BAK Free, (TRAVATAN) 0.004 % SOLN ophthalmic solution Place 1 drop into both eyes at bedtime.  1 Bottle  0  . triamcinolone ointment (KENALOG) 0.1 % Apply 1 application topically 2 (two) times daily.  454 g  3  . DISCONTD: fentaNYL (DURAGESIC) 50 MCG/HR Place 1 patch (50 mcg total) onto the skin every 3 (three) days.  10 patch  0     Past Medical History  Diagnosis Date  . Glaucoma   . Arthritis   . Hypertension   . Hyperlipidemia   . SBO (small bowel obstruction) 12/25/2010  . Peripheral vascular disease   . 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 Chronic lower back pain   . Chronic pain      Past Surgical History  Procedure Date  . Eye surgery   . 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 Appendectomy   . Cholecystectomy   . Colectomy 01/2011    Dr. Jamey Ripa; "took out 12 inches of small intestiines and removed blockage"  . Abdominal hysterectomy      No family history  on file.   History   Social History  . Marital Status: Widowed    Spouse Name: N/A    Number of Children: N/A  . Years of Education: N/A   Occupational History  .      Social History Main Topics  . Smoking status: Current Everyday Smoker -- 0.5 packs/day for 50 years    Types: Cigarettes    Last Attempt to Quit: 12/24/2010  . Smokeless tobacco: Never Used   Comment: "stopped 06/2010; started back up Thanksgiving 2012"  . Alcohol Use: No     "last drink of alcohol ~ 1977"  . Drug Use: No  . Sexually Active: No   Other Topics Concern  . Not on file   Social History Narrative  . No narrative on file      PHYSICAL EXAM   BP 144/79  Pulse 70  Ht 5\' 8"  (1.727 m)  Wt 128 lb (58.06 kg)  BMI 19.46 kg/m2  SpO2 99%  Constitutional: She is oriented to person, place, and time. She appears well-developed and well-nourished. No distress.    HENT: No nasal discharge.  Head: Normocephalic and atraumatic.  Eyes: Pupils are equal and round. Right eye exhibits no discharge. Left eye exhibits no discharge.  Neck: Normal range of motion. Neck supple. No JVD present. No thyromegaly present.  Cardiovascular: Normal rate, regular rhythm, normal heart sounds. Exam reveals no gallop and no friction rub. No murmur heard.  Pulmonary/Chest: Effort normal and breath sounds normal. No stridor. No respiratory distress. She has no wheezes. She has no rales. She exhibits no tenderness.  Abdominal: Soft. Bowel sounds are normal. She exhibits no distension. There is no tenderness. There is no rebound and no guarding.  Musculoskeletal: Normal range of motion. She exhibits no edema and no tenderness.  Neurological: She is alert and oriented to person, place, and time. Coordination normal.  Skin: Skin is warm and dry. No rash noted. She is not diaphoretic. No erythema. No pallor.  Psychiatric: She has a normal mood and affect. Her behavior is normal. Judgment and thought content normal.  Vascular: Femoral pulse is normal bilaterally with a bruit in the right side only. PT/DP is palpable bilaterally.    ASSESSMENT AND PLAN

## 2011-11-03 ENCOUNTER — Ambulatory Visit: Payer: Medicare Other | Admitting: Infectious Diseases

## 2011-11-14 ENCOUNTER — Other Ambulatory Visit: Payer: Self-pay | Admitting: Infectious Diseases

## 2011-11-14 DIAGNOSIS — F419 Anxiety disorder, unspecified: Secondary | ICD-10-CM

## 2011-11-24 ENCOUNTER — Ambulatory Visit (INDEPENDENT_AMBULATORY_CARE_PROVIDER_SITE_OTHER): Payer: Medicare Other

## 2011-11-24 DIAGNOSIS — Z23 Encounter for immunization: Secondary | ICD-10-CM

## 2011-12-04 ENCOUNTER — Other Ambulatory Visit: Payer: Self-pay | Admitting: *Deleted

## 2011-12-04 DIAGNOSIS — G894 Chronic pain syndrome: Secondary | ICD-10-CM

## 2011-12-04 DIAGNOSIS — A6000 Herpesviral infection of urogenital system, unspecified: Secondary | ICD-10-CM

## 2011-12-04 DIAGNOSIS — B20 Human immunodeficiency virus [HIV] disease: Secondary | ICD-10-CM

## 2011-12-04 DIAGNOSIS — M5126 Other intervertebral disc displacement, lumbar region: Secondary | ICD-10-CM

## 2011-12-04 MED ORDER — FENTANYL 50 MCG/HR TD PT72: 1.0000 | MEDICATED_PATCH | TRANSDERMAL | Status: DC

## 2011-12-04 MED ORDER — EFAVIRENZ-EMTRICITAB-TENOFOVIR 600-200-300 MG PO TABS: 1.0000 | ORAL_TABLET | Freq: Every day | ORAL | Status: DC

## 2011-12-04 MED ORDER — ACYCLOVIR 400 MG PO TABS
400.0000 mg | ORAL_TABLET | Freq: Three times a day (TID) | ORAL | Status: DC
Start: 2011-12-04 — End: 2012-10-07

## 2011-12-04 NOTE — Telephone Encounter (Signed)
Pt will pick up Fentanyl rx Friday, Sept. 27 in the PM.

## 2011-12-08 ENCOUNTER — Other Ambulatory Visit: Payer: Self-pay | Admitting: *Deleted

## 2011-12-08 DIAGNOSIS — R52 Pain, unspecified: Secondary | ICD-10-CM

## 2011-12-08 DIAGNOSIS — E785 Hyperlipidemia, unspecified: Secondary | ICD-10-CM

## 2011-12-08 DIAGNOSIS — I10 Essential (primary) hypertension: Secondary | ICD-10-CM

## 2011-12-08 MED ORDER — LOVASTATIN 40 MG PO TABS: 40.0000 mg | ORAL_TABLET | Freq: Every day | ORAL | Status: DC

## 2011-12-08 MED ORDER — HYDROCODONE-ACETAMINOPHEN 10-650 MG PO TABS: 1.0000 | ORAL_TABLET | Freq: Two times a day (BID) | ORAL | Status: DC | PRN

## 2011-12-08 MED ORDER — BENAZEPRIL HCL 20 MG PO TABS: 20.0000 mg | ORAL_TABLET | Freq: Every day | ORAL | Status: DC

## 2011-12-11 ENCOUNTER — Other Ambulatory Visit: Payer: Self-pay | Admitting: *Deleted

## 2011-12-11 DIAGNOSIS — R252 Cramp and spasm: Secondary | ICD-10-CM

## 2011-12-11 MED ORDER — CYCLOBENZAPRINE HCL 10 MG PO TABS: 10.0000 mg | ORAL_TABLET | Freq: Three times a day (TID) | ORAL | Status: DC | PRN

## 2011-12-13 ENCOUNTER — Ambulatory Visit: Payer: Medicare Other | Admitting: Infectious Diseases

## 2011-12-13 ENCOUNTER — Ambulatory Visit (INDEPENDENT_AMBULATORY_CARE_PROVIDER_SITE_OTHER): Payer: Medicare Other | Admitting: Infectious Diseases

## 2011-12-13 ENCOUNTER — Encounter: Payer: Self-pay | Admitting: Infectious Diseases

## 2011-12-13 VITALS — BP 191/85 | HR 86 | Temp 98.0°F | Wt 127.0 lb

## 2011-12-13 DIAGNOSIS — I10 Essential (primary) hypertension: Secondary | ICD-10-CM

## 2011-12-13 DIAGNOSIS — B20 Human immunodeficiency virus [HIV] disease: Secondary | ICD-10-CM

## 2011-12-13 MED ORDER — BENAZEPRIL HCL 40 MG PO TABS: 40.0000 mg | ORAL_TABLET | Freq: Every day | ORAL | Status: DC

## 2011-12-13 NOTE — Progress Notes (Signed)
Patient ID: Jessica [name], female   DOB: Sep 15, 1940, 71 y.o.   MRN: [id number] HIV f/u visit   Jessica [name] has no complaints referable to HIV. She has no fevers or weight loss. 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 She is under care of Dr. Berton Mount and he is doing all he can for control of her BP and lipids, but but she continues to smoke about 8-10 cigarettes/day that put her at far more threat to her health than HIV. She knows this and reinforced self directed smoking cessation and calling Quit line. Exam  BP 191/85  Pulse 86  Temp 98 F (36.7 C) (Oral)  Wt 127 lb (57.607 kg) Home BPs 2 to 3 times daily show range of 135-175/80-100. Dr. Graciela Husbands is managing her BP. Chest clear. No adenopathy nor rashes. Impression/plan HIV controled and will continue ARVS. Flu shot today. Will see in 5 months. [name]

## 2011-12-15 ENCOUNTER — Ambulatory Visit: Payer: Medicare Other | Admitting: Infectious Diseases

## 2011-12-25 ENCOUNTER — Other Ambulatory Visit: Payer: Self-pay | Admitting: *Deleted

## 2011-12-25 DIAGNOSIS — B20 Human immunodeficiency virus [HIV] disease: Secondary | ICD-10-CM

## 2011-12-25 MED ORDER — ENSURE PO LIQD: 237.0000 mL | Freq: Two times a day (BID) | ORAL | Status: DC

## 2012-01-02 ENCOUNTER — Other Ambulatory Visit: Payer: Self-pay | Admitting: *Deleted

## 2012-01-02 DIAGNOSIS — G894 Chronic pain syndrome: Secondary | ICD-10-CM

## 2012-01-02 DIAGNOSIS — M5126 Other intervertebral disc displacement, lumbar region: Secondary | ICD-10-CM

## 2012-01-02 DIAGNOSIS — L309 Dermatitis, unspecified: Secondary | ICD-10-CM

## 2012-01-02 MED ORDER — FENTANYL 50 MCG/HR TD PT72: 1.0000 | MEDICATED_PATCH | TRANSDERMAL | Status: DC

## 2012-01-02 MED ORDER — TRIAMCINOLONE ACETONIDE 0.1 % EX OINT: 1.0000 "application " | TOPICAL_OINTMENT | Freq: Two times a day (BID) | CUTANEOUS | Status: DC

## 2012-01-02 NOTE — Telephone Encounter (Signed)
Pt has remaining refills on flexeril and hydrocodone/apap rxes.  Needs to call pharmacy to refill.

## 2012-01-06 ENCOUNTER — Emergency Department (HOSPITAL_COMMUNITY)
Admission: EM | Admit: 2012-01-06 | Discharge: 2012-01-06 | Disposition: A | Discharge status: Home / Self Care | Payer: Medicare Other | Attending: Emergency Medicine | Admitting: Emergency Medicine

## 2012-01-06 ENCOUNTER — Encounter (HOSPITAL_COMMUNITY): Payer: Self-pay | Admitting: *Deleted

## 2012-01-06 DIAGNOSIS — H409 Unspecified glaucoma: Secondary | ICD-10-CM | POA: Insufficient documentation

## 2012-01-06 DIAGNOSIS — Z8719 Personal history of other diseases of the digestive system: Secondary | ICD-10-CM | POA: Insufficient documentation

## 2012-01-06 DIAGNOSIS — R109 Unspecified abdominal pain: Secondary | ICD-10-CM | POA: Diagnosis not present

## 2012-01-06 DIAGNOSIS — N390 Urinary tract infection, site not specified: Secondary | ICD-10-CM | POA: Diagnosis not present

## 2012-01-06 DIAGNOSIS — E785 Hyperlipidemia, unspecified: Secondary | ICD-10-CM | POA: Diagnosis not present

## 2012-01-06 DIAGNOSIS — G8929 Other chronic pain: Secondary | ICD-10-CM | POA: Diagnosis not present

## 2012-01-06 DIAGNOSIS — F17200 Nicotine dependence, unspecified, uncomplicated: Secondary | ICD-10-CM | POA: Diagnosis not present

## 2012-01-06 DIAGNOSIS — I10 Essential (primary) hypertension: Secondary | ICD-10-CM | POA: Insufficient documentation

## 2012-01-06 DIAGNOSIS — M129 Arthropathy, unspecified: Secondary | ICD-10-CM | POA: Diagnosis not present

## 2012-01-06 DIAGNOSIS — Z21 Asymptomatic human immunodeficiency virus [HIV] infection status: Secondary | ICD-10-CM | POA: Diagnosis not present

## 2012-01-06 DIAGNOSIS — M545 Low back pain, unspecified: Secondary | ICD-10-CM | POA: Diagnosis not present

## 2012-01-06 DIAGNOSIS — Z7982 Long term (current) use of aspirin: Secondary | ICD-10-CM | POA: Insufficient documentation

## 2012-01-06 LAB — URINALYSIS, ROUTINE W REFLEX MICROSCOPIC
Glucose, UA: NEGATIVE mg/dL
Ketones, ur: 40 mg/dL — AB
Protein, ur: 100 mg/dL — AB
Urobilinogen, UA: 1 mg/dL (ref 0.0–1.0)

## 2012-01-06 LAB — URINE MICROSCOPIC-ADD ON

## 2012-01-06 MED ORDER — PHENAZOPYRIDINE HCL 200 MG PO TABS: 200.0000 mg | ORAL_TABLET | Freq: Three times a day (TID) | ORAL | Status: DC

## 2012-01-06 MED ORDER — CEPHALEXIN 500 MG PO CAPS: 500.0000 mg | ORAL_CAPSULE | Freq: Four times a day (QID) | ORAL | Status: DC

## 2012-01-06 MED ORDER — CEPHALEXIN 250 MG PO CAPS
500.0000 mg | ORAL_CAPSULE | Freq: Once | ORAL | Status: AC
  Administered 2012-01-06: 500 mg via ORAL
  Filled 2012-01-06: qty 2

## 2012-01-06 NOTE — ED Provider Notes (Signed)
History     CSN: [id number]  Arrival date & time 01/06/12  Jessica [name]   First MD Initiated Contact with Patient 01/06/12 1959      Chief Complaint  Patient presents with  . Hematuria    (Consider location/radiation/quality/duration/timing/severity/associated sxs/prior treatment) HPI Comments: Patient with h/o HIV, stating last CD4 count was 'very good' -- states suprapubic pain and hematuria x 2 days. She has not had these symptoms in the past. No fever, N/V/D. She has constipation at baseline. H/o hysterectomy, small bowel resection. Onset gradual. Course constant. Nothing makes sx better or worse. No treatments PTA.   Patient is a 71 y.o. female presenting with hematuria. The history is provided by the patient.  Hematuria    Past Medical History  Diagnosis Date  . Glaucoma   . Arthritis   . Hypertension   . Hyperlipidemia   . SBO (small bowel obstruction) 12/25/2010  . Peripheral vascular disease   . 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 Chronic lower back pain   . Chronic pain     Past Surgical History  Procedure Date  . Eye surgery   . 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 Appendectomy   . Cholecystectomy   . Colectomy 01/2011    Dr. Jamey Ripa; "took out 12 inches of small intestiines and removed blockage"  . Abdominal hysterectomy     No family history on file.  History  Substance Use Topics  . Smoking status: Current Every Day Smoker -- 0.5 packs/day for 50 years    Types: Cigarettes    Last Attempt to Quit: 12/24/2010  . Smokeless tobacco: Never Used   Comment: "stopped 06/2010; started back up Thanksgiving 2012"  . Alcohol Use: No     "last drink of alcohol ~ 1977"    OB History    Grav Para Term Preterm Abortions TAB SAB Ect Mult Living                  Review of Systems  Genitourinary: Positive for hematuria.  All other systems reviewed and are negative.    Allergies  Review of patient's allergies indicates  no known allergies.  Home Medications   Current Outpatient Rx  Name Route Sig Dispense Refill  . ACYCLOVIR 400 MG PO TABS Oral Take 1 tablet (400 mg total) by mouth 3 (three) times daily. as needed for herpes outbreak 21 tablet prn  . ALPRAZOLAM 1 MG PO TABS  TAKE 1 TABLET BY MOUTH THREE TIMES DAILY AS NEEDED FOR ANXIETY 90 tablet 4    Generic HYQ:MVHQI    1MG   . ASPIRIN 81 MG PO TBEC Oral Take 81 mg by mouth daily.      Marland Kitchen BENAZEPRIL HCL 40 MG PO TABS Oral Take 1 tablet (40 mg total) by mouth daily. 30 tablet 11  . CYCLOBENZAPRINE HCL 10 MG PO TABS Oral Take 1 tablet (10 mg total) by mouth 3 (three) times daily as needed. For spasms 30 tablet 5  . EFAVIRENZ-EMTRICITAB-TENOFOVIR 600-200-300 MG PO TABS Oral Take 1 tablet by mouth at bedtime. 30 tablet prn  . ENSURE PO LIQD Oral Take 237 mLs by mouth 2 (two) times daily. 237 mL 11  . FENTANYL 50 MCG/HR TD PT72 Transdermal Place 1 patch (50 mcg total) onto the skin every 3 (three) days. 10 patch 0  . HYDROCODONE-ACETAMINOPHEN 10-650 MG PO TABS Oral Take 1 tablet by mouth 2 (two) times daily as needed for  pain. PRN 60 tablet 2  . IBUPROFEN 200 MG PO TABS Oral Take 800 mg by mouth every 6 (six) hours as needed. For pain.    Marland Kitchen LOVASTATIN 40 MG PO TABS Oral Take 1 tablet (40 mg total) by mouth at bedtime. 30 tablet 5  . ONE-DAILY MULTI VITAMINS PO TABS Oral Take 1 tablet by mouth daily.    Bernadette Hoit SODIUM 8.6-50 MG PO TABS Oral Take 2 tablets by mouth 2 (two) times daily. 120 tablet 3  . TRAVOPROST (BAK FREE) 0.004 % OP SOLN Both Eyes Place 1 drop into both eyes at bedtime. 1 Bottle 0  . TRIAMCINOLONE ACETONIDE 0.1 % EX OINT Topical Apply 1 application topically 2 (two) times daily. 454 g 3    BP 129/61  Pulse 63  Temp 98.9 F (37.2 C) (Oral)  Resp 20  Ht 5\' 8"  (1.727 m)  Wt 130 lb (58.968 kg)  BMI 19.77 kg/m2  SpO2 95%  Physical Exam  Nursing note and vitals reviewed. Constitutional: She appears well-developed and  well-nourished.  HENT:  Head: Normocephalic and atraumatic.  Eyes: Conjunctivae normal are normal. Right eye exhibits no discharge. Left eye exhibits no discharge.  Neck: Normal range of motion. Neck supple.  Cardiovascular: Normal rate, regular rhythm and normal heart sounds.   Pulmonary/Chest: Effort normal and breath sounds normal.  Abdominal: Soft. There is tenderness (mild) in the suprapubic area. There is no rebound, no guarding, no CVA tenderness, no tenderness at McBurney's point and negative Murphy's sign.  Neurological: She is alert.  Skin: Skin is warm and dry.  Psychiatric: She has a normal mood and affect.    ED Course  Procedures (including critical care time)   Labs Reviewed  URINALYSIS, ROUTINE W REFLEX MICROSCOPIC   No results found.   No diagnosis found.  8:19 PM Patient seen and examined. Awaiting UA.   Vital signs reviewed and are as follows: Filed Vitals:   01/06/12 1845  BP: 129/61  Pulse: 63  Temp: 98.9 F (37.2 C)  Resp: 20   UA suggests UTI. Culture sent. Urged PCP follow-up to ensure resolution.   Patient urged to return with worsening symptoms, fever, vomiting or other concerns. Patient verbalized understanding and agrees with plan.      MDM  Hematuria, suprapubic pain. No evidence of pyelo. Will treat UTI -- have patient f/u for recheck. She appears well. Pt reports CD4 'very good'. No need for CBC at this point.         Renne Crigler, [place] 01/08/12 1820

## 2012-01-06 NOTE — ED Notes (Signed)
Patient reports she has noted blood in her urine and she has lower abd pain for 2 days.  She denies fever

## 2012-01-08 LAB — URINE CULTURE: Colony Count: >=100000

## 2012-01-09 NOTE — ED Provider Notes (Signed)
Medical screening examination/treatment/procedure(s) were performed by non-physician practitioner and as supervising physician I was immediately available for consultation/collaboration.  Martha K Linker, MD 01/09/12 1205 

## 2012-01-09 NOTE — ED Notes (Signed)
+   Urine Patient treated with Keflex-sensitive to same-chart sent to EDP office for review. 

## 2012-01-19 ENCOUNTER — Ambulatory Visit: Payer: Medicare Other | Admitting: Infectious Diseases

## 2012-02-02 ENCOUNTER — Other Ambulatory Visit: Payer: Self-pay | Admitting: *Deleted

## 2012-02-02 DIAGNOSIS — G894 Chronic pain syndrome: Secondary | ICD-10-CM

## 2012-02-02 DIAGNOSIS — R252 Cramp and spasm: Secondary | ICD-10-CM

## 2012-02-02 DIAGNOSIS — M5126 Other intervertebral disc displacement, lumbar region: Secondary | ICD-10-CM

## 2012-02-02 MED ORDER — FENTANYL 50 MCG/HR TD PT72: 1.0000 | MEDICATED_PATCH | TRANSDERMAL | Status: DC

## 2012-02-02 MED ORDER — CYCLOBENZAPRINE HCL 10 MG PO TABS: 10.0000 mg | ORAL_TABLET | Freq: Three times a day (TID) | ORAL | Status: DC | PRN

## 2012-02-02 NOTE — Telephone Encounter (Signed)
Pt requested flexeril tablets increase back to 90 because she does take them 3 times a day.  Resent rx for 90-tablets.

## 2012-02-03 ENCOUNTER — Emergency Department (HOSPITAL_COMMUNITY)
Admission: EM | Admit: 2012-02-03 | Discharge: 2012-02-03 | Disposition: A | Discharge status: Home / Self Care | Payer: Medicare Other | Attending: Emergency Medicine | Admitting: Emergency Medicine

## 2012-02-03 ENCOUNTER — Emergency Department (HOSPITAL_COMMUNITY): Payer: Medicare Other

## 2012-02-03 ENCOUNTER — Encounter (HOSPITAL_COMMUNITY): Payer: Self-pay | Admitting: *Deleted

## 2012-02-03 DIAGNOSIS — G8929 Other chronic pain: Secondary | ICD-10-CM | POA: Insufficient documentation

## 2012-02-03 DIAGNOSIS — E785 Hyperlipidemia, unspecified: Secondary | ICD-10-CM | POA: Insufficient documentation

## 2012-02-03 DIAGNOSIS — Z8739 Personal history of other diseases of the musculoskeletal system and connective tissue: Secondary | ICD-10-CM | POA: Diagnosis not present

## 2012-02-03 DIAGNOSIS — F17200 Nicotine dependence, unspecified, uncomplicated: Secondary | ICD-10-CM | POA: Insufficient documentation

## 2012-02-03 DIAGNOSIS — I2510 Atherosclerotic heart disease of native coronary artery without angina pectoris: Secondary | ICD-10-CM | POA: Diagnosis not present

## 2012-02-03 DIAGNOSIS — B20 Human immunodeficiency virus [HIV] disease: Secondary | ICD-10-CM | POA: Insufficient documentation

## 2012-02-03 DIAGNOSIS — R079 Chest pain, unspecified: Secondary | ICD-10-CM | POA: Diagnosis not present

## 2012-02-03 DIAGNOSIS — Z8719 Personal history of other diseases of the digestive system: Secondary | ICD-10-CM | POA: Diagnosis not present

## 2012-02-03 DIAGNOSIS — M25519 Pain in unspecified shoulder: Secondary | ICD-10-CM | POA: Diagnosis not present

## 2012-02-03 DIAGNOSIS — I10 Essential (primary) hypertension: Secondary | ICD-10-CM | POA: Insufficient documentation

## 2012-02-03 DIAGNOSIS — Z79899 Other long term (current) drug therapy: Secondary | ICD-10-CM | POA: Insufficient documentation

## 2012-02-03 DIAGNOSIS — Z8679 Personal history of other diseases of the circulatory system: Secondary | ICD-10-CM | POA: Diagnosis not present

## 2012-02-03 DIAGNOSIS — I739 Peripheral vascular disease, unspecified: Secondary | ICD-10-CM | POA: Diagnosis not present

## 2012-02-03 DIAGNOSIS — R0789 Other chest pain: Secondary | ICD-10-CM | POA: Insufficient documentation

## 2012-02-03 DIAGNOSIS — R0602 Shortness of breath: Secondary | ICD-10-CM | POA: Diagnosis not present

## 2012-02-03 DIAGNOSIS — M549 Dorsalgia, unspecified: Secondary | ICD-10-CM | POA: Insufficient documentation

## 2012-02-03 DIAGNOSIS — S4980XA Other specified injuries of shoulder and upper arm, unspecified arm, initial encounter: Secondary | ICD-10-CM | POA: Diagnosis not present

## 2012-02-03 DIAGNOSIS — M25529 Pain in unspecified elbow: Secondary | ICD-10-CM | POA: Insufficient documentation

## 2012-02-03 DIAGNOSIS — R071 Chest pain on breathing: Secondary | ICD-10-CM | POA: Diagnosis not present

## 2012-02-03 LAB — CBC WITH DIFFERENTIAL/PLATELET
Basophils Relative: 1 % (ref 0–1)
Eosinophils Absolute: 0.3 10*3/uL (ref 0.0–0.7)
Hemoglobin: 13.3 g/dL (ref 12.0–15.0)
Lymphs Abs: 3.9 10*3/uL (ref 0.7–4.0)
MCHC: 34.4 g/dL (ref 30.0–36.0)
Monocytes Relative: 4 % (ref 3–12)
Neutro Abs: 3.6 10*3/uL (ref 1.7–7.7)
Neutrophils Relative %: 44 % (ref 43–77)
Platelets: 284 10*3/uL (ref 150–400)
RBC: 4 MIL/uL (ref 3.87–5.11)

## 2012-02-03 LAB — TROPONIN I: Troponin I: <0.3 ng/mL (ref <0.30)

## 2012-02-03 LAB — BASIC METABOLIC PANEL
BUN: 12 mg/dL (ref 6–23)
Chloride: 104 mEq/L (ref 96–112)
GFR calc Af Amer: >90 mL/min (ref >90)
GFR calc non Af Amer: >90 mL/min (ref >90)
Potassium: 3.6 mEq/L (ref 3.5–5.1)
Sodium: 137 mEq/L (ref 135–145)

## 2012-02-03 MED ORDER — LORAZEPAM 2 MG/ML IJ SOLN
1.0000 mg | Freq: Once | INTRAMUSCULAR | Status: AC
  Administered 2012-02-03: 1 mg via INTRAVENOUS
  Filled 2012-02-03: qty 1

## 2012-02-03 MED ORDER — IOHEXOL 350 MG/ML SOLN
65.0000 mL | Freq: Once | INTRAVENOUS | Status: AC | PRN
  Administered 2012-02-03: 65 mL via INTRAVENOUS

## 2012-02-03 MED ORDER — ASPIRIN 81 MG PO CHEW
243.0000 mg | CHEWABLE_TABLET | Freq: Once | ORAL | Status: AC
  Administered 2012-02-03: 243 mg via ORAL
  Filled 2012-02-03: qty 3

## 2012-02-03 NOTE — Progress Notes (Signed)
Patient ID: Jessica [name], female   DOB: [birth year], 71 y.o.   MRN: [id number]   Rt arm antecubital space extravasation of 30 ml CT contrast  FROM No sign of redness minimal swelling noted NT Good pulses Skin intact  Will ice and elevate and check in am if admitted

## 2012-02-03 NOTE — ED Notes (Signed)
Lt. Sided chest wall  Pain (inc. With inspiration) and rt. Shoulder pain that started 11/13. Hit shoulder on stove and landed on lt. Side. Wears fentanyl patch, hydrocodone and flexeril - no relief.

## 2012-02-03 NOTE — ED Notes (Signed)
CT called stated IV infiltrated and did complete CT scan. The Radiologist was notified by CT.

## 2012-02-03 NOTE — ED Notes (Signed)
Paged IV team for IV start.

## 2012-02-03 NOTE — ED Notes (Signed)
PT returned back to ER. Staff in ct stated patient was unable to ly still for procedure. Procedure was not completed.Marland KitchenMarland Kitchen

## 2012-02-03 NOTE — ED Provider Notes (Signed)
History     CSN: [id number]  Arrival date & time 02/03/12  0810   First MD Initiated Contact with Patient 02/03/12 [phone]      Chief Complaint  Patient presents with  . Shoulder Pain  . Chest Pain    (Consider location/radiation/quality/duration/timing/severity/associated sxs/prior treatment) HPI Pt with history of HIV, HTN, HLD and chronic R shoulder pain reports she had sudden onset of severe sharp L lower chest pain about 11 days ago which caused her to fall re-injuring her R shoulder. She had cough at about the same time as the onset of her pain, but none since. She reports pain is worse with deep breath. No fever, no leg swelling. No nausea vomiting or diaphoresis. She has fentanyl patch and hydrocodone at home with minimal improvement. Has not been to see PCP for same. CD4 count has been >1000, undetectable viral loads.   Past Medical History  Diagnosis Date  . Glaucoma   . Arthritis   . Hypertension   . Hyperlipidemia   . SBO (small bowel obstruction) 12/25/2010  . Peripheral vascular disease   . 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 Chronic lower back pain   . Chronic pain     Past Surgical History  Procedure Date  . Eye surgery   . 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 Appendectomy   . Cholecystectomy   . Colectomy 01/2011    Dr. Jamey Ripa; "took out 12 inches of small intestiines and removed blockage"  . Abdominal hysterectomy     No family history on file.  History  Substance Use Topics  . Smoking status: Current Every Day Smoker -- 0.5 packs/day for 50 years    Types: Cigarettes    Last Attempt to Quit: 12/24/2010  . Smokeless tobacco: Never Used     Comment: "stopped 06/2010; started back up Thanksgiving 2012"  . Alcohol Use: No     Comment: "last drink of alcohol ~ 1977"    OB History    Grav Para Term Preterm Abortions TAB SAB Ect Mult Living                  Review of Systems All other systems reviewed and  are negative except as noted in HPI.   Allergies  Review of patient's allergies indicates no known allergies.  Home Medications   Current Outpatient Rx  Name  Route  Sig  Dispense  Refill  . ACETAMINOPHEN 500 MG PO TABS   Oral   Take 500 mg by mouth every 4 (four) hours as needed. For pain         . ACYCLOVIR 400 MG PO TABS   Oral   Take 1 tablet (400 mg total) by mouth 3 (three) times daily. as needed for herpes outbreak   21 tablet   prn   . ALPRAZOLAM 1 MG PO TABS   Oral   Take 1 mg by mouth 3 (three) times daily as needed. For anxiety         . ASPIRIN 81 MG PO TBEC   Oral   Take 81 mg by mouth daily.           Marland Kitchen BENAZEPRIL HCL 40 MG PO TABS   Oral   Take 40 mg by mouth at bedtime.         . CYCLOBENZAPRINE HCL 10 MG PO TABS   Oral   Take 1 tablet (10 mg total) by  mouth 3 (three) times daily as needed. For spasms   90 tablet   5   . EFAVIRENZ-EMTRICITAB-TENOFOVIR 600-200-300 MG PO TABS   Oral   Take 1 tablet by mouth at bedtime.   30 tablet   prn   . ENSURE PO LIQD   Oral   Take 237 mLs by mouth 2 (two) times daily.   237 mL   11   . FENTANYL 50 MCG/HR TD PT72   Transdermal   Place 1 patch (50 mcg total) onto the skin every 3 (three) days.   10 patch   0   . HYDROCODONE-ACETAMINOPHEN 10-650 MG PO TABS   Oral   Take 1 tablet by mouth 2 (two) times daily as needed for pain. PRN   60 tablet   2   . LOVASTATIN 40 MG PO TABS   Oral   Take 1 tablet (40 mg total) by mouth at bedtime.   30 tablet   5   . ADULT MULTIVITAMIN W/MINERALS CH   Oral   Take 1 tablet by mouth daily.         Bernadette Hoit SODIUM 8.6-50 MG PO TABS   Oral   Take 2 tablets by mouth 2 (two) times daily.   120 tablet   3   . TRAVOPROST (BAK FREE) 0.004 % OP SOLN   Both Eyes   Place 1 drop into both eyes at bedtime.   1 Bottle   0   . TRIAMCINOLONE ACETONIDE 0.1 % EX OINT   Topical   Apply 1 application topically 2 (two) times daily.   454 g   3       BP 169/81  Pulse 79  Temp 98.1 F (36.7 C) (Oral)  SpO2 100%  Physical Exam  Nursing note and vitals reviewed. Constitutional: She is oriented to person, place, and time. She appears well-developed and well-nourished.  HENT:  Head: Normocephalic and atraumatic.  Eyes: EOM are normal. Pupils are equal, round, and reactive to light.  Neck: Normal range of motion. Neck supple.  Cardiovascular: Normal rate, normal heart sounds and intact distal pulses.   Pulmonary/Chest: Effort normal and breath sounds normal. She has no wheezes. She has no rales. She exhibits tenderness (L lower).  Abdominal: Bowel sounds are normal. She exhibits no distension. There is no tenderness.  Musculoskeletal: Normal range of motion. She exhibits tenderness (R shoulder). She exhibits no edema.  Neurological: She is alert and oriented to person, place, and time. She has normal strength. No cranial nerve deficit or sensory deficit.  Skin: Skin is warm and dry. No rash noted.  Psychiatric: She has a normal mood and affect.    ED Course  Procedures (including critical care time)  Labs Reviewed  CBC WITH DIFFERENTIAL - Abnormal; Notable for the following:    Lymphocytes Relative 48 (*)     All other components within normal limits  BASIC METABOLIC PANEL - Abnormal; Notable for the following:    Glucose, Bld 105 (*)     All other components within normal limits  D-DIMER, QUANTITATIVE - Abnormal; Notable for the following:    D-Dimer, Quant 3.07 (*)     All other components within normal limits  TROPONIN I   Dg Chest 2 View  02/03/2012  *RADIOLOGY REPORT*  Clinical Data: Chest pain, shortness of breath and cough.  CHEST - 2 VIEW  Comparison: 12/15/2010  Findings: Stable appearance of chronic lung disease.  No edema, pneumothorax, infiltrate or nodules detected.  No evidence of pleural effusion.  The heart size and mediastinal contours within normal limits.  The bony thorax shows mild degenerative changes  of the spine and scoliosis.  IMPRESSION: Stable chronic lung disease.  No acute findings.   Original Report Authenticated By: Irish Lack, M.D.    Dg Shoulder Right  02/03/2012  *RADIOLOGY REPORT*  Clinical Data: Recent fall with right shoulder pain.  RIGHT SHOULDER - 2+ VIEW  Comparison: None.  Findings: No acute fracture or dislocation.  Mild degenerative changes are seen involving the humeral head.  The [facility] joint shows normal alignment.  No soft tissue abnormalities.  No bony lesions or obstruction.  IMPRESSION: No acute fracture or dislocation.   Original Report Authenticated By: Irish Lack, M.D.    Ct Angio Chest Pe W/cm &/or Wo Cm  02/03/2012  *RADIOLOGY REPORT*  Clinical Data: Left-sided chest pain.  CT ANGIOGRAPHY CHEST  Technique:  Multidetector CT imaging of the chest using the standard protocol during bolus administration of intravenous contrast. Multiplanar reconstructed images including MIPs were obtained and reviewed to evaluate the vascular anatomy.  Contrast: 65mL OMNIPAQUE IOHEXOL 350 MG/ML SOLN  Comparison: Chest x-ray earlier today.  Findings: The pulmonary arteries are adequately opacified.  There is no evidence of pulmonary embolism.  Lungs show scattered atelectasis and scarring as well as chronic lung disease bilaterally.  Calcified coronary atherosclerotic plaque is noted in the distribution of the LAD.  There likely also is calcification associated with the left circumflex coronary artery which is less well defined due to motion artifact.  Overall heart size is normal.  No pleural or pericardial effusion.  No enlarged lymph nodes or focal pulmonary masses are identified.  The visualized airway is normally patent.  Upper abdomen is unremarkable.  The spine demonstrates degenerative changes as well as kyphosis without evidence of compression fracture or bony lesions.  IMPRESSION:  1.  No evidence of acute pulmonary embolism. 2.  Chronic lung disease with scattered areas of  parenchymal scarring and atelectasis bilaterally. 3.  Coronary atherosclerosis with calcified plaque noted in the distribution of the LAD and also suspected in the distribution of the left circumflex coronary artery.   Original Report Authenticated By: Irish Lack, M.D.      No diagnosis found.    MDM   Date: 02/03/2012  Rate: 81  Rhythm: normal sinus rhythm  QRS Axis: normal  Intervals: normal  ST/T Wave abnormalities: normal  Conduction Disutrbances: none  Narrative Interpretation: unremarkable  2:27 PM Pt with neg CT angio, no concern for ACS/cardiac etiology. Advised PCP followup.           Devyon Keator B. Bernette Mayers, MD 02/03/12 1427

## 2012-02-08 ENCOUNTER — Emergency Department (HOSPITAL_COMMUNITY): Payer: Medicare Other

## 2012-02-08 ENCOUNTER — Inpatient Hospital Stay (HOSPITAL_COMMUNITY)
Admission: EM | Admit: 2012-02-08 | Discharge: 2012-02-19 | DRG: 335 | Disposition: A | Discharge status: Home / Self Care | Payer: Medicare Other | Attending: Internal Medicine | Admitting: Internal Medicine

## 2012-02-08 ENCOUNTER — Encounter (HOSPITAL_COMMUNITY): Payer: Self-pay | Admitting: Emergency Medicine

## 2012-02-08 DIAGNOSIS — G894 Chronic pain syndrome: Secondary | ICD-10-CM

## 2012-02-08 DIAGNOSIS — K56609 Unspecified intestinal obstruction, unspecified as to partial versus complete obstruction: Secondary | ICD-10-CM

## 2012-02-08 DIAGNOSIS — N398 Other specified disorders of urinary system: Secondary | ICD-10-CM | POA: Diagnosis present

## 2012-02-08 DIAGNOSIS — E876 Hypokalemia: Secondary | ICD-10-CM | POA: Diagnosis not present

## 2012-02-08 DIAGNOSIS — K5730 Diverticulosis of large intestine without perforation or abscess without bleeding: Secondary | ICD-10-CM

## 2012-02-08 DIAGNOSIS — F329 Major depressive disorder, single episode, unspecified: Secondary | ICD-10-CM | POA: Diagnosis present

## 2012-02-08 DIAGNOSIS — K565 Intestinal adhesions [bands], unspecified as to partial versus complete obstruction: Principal | ICD-10-CM | POA: Diagnosis present

## 2012-02-08 DIAGNOSIS — Z8719 Personal history of other diseases of the digestive system: Secondary | ICD-10-CM | POA: Diagnosis present

## 2012-02-08 DIAGNOSIS — R079 Chest pain, unspecified: Secondary | ICD-10-CM | POA: Diagnosis not present

## 2012-02-08 DIAGNOSIS — K5900 Constipation, unspecified: Secondary | ICD-10-CM | POA: Diagnosis present

## 2012-02-08 DIAGNOSIS — K5790 Diverticulosis of intestine, part unspecified, without perforation or abscess without bleeding: Secondary | ICD-10-CM

## 2012-02-08 DIAGNOSIS — B20 Human immunodeficiency virus [HIV] disease: Secondary | ICD-10-CM | POA: Diagnosis present

## 2012-02-08 DIAGNOSIS — H409 Unspecified glaucoma: Secondary | ICD-10-CM | POA: Diagnosis present

## 2012-02-08 DIAGNOSIS — K560 Paralytic ileus: Secondary | ICD-10-CM | POA: Diagnosis present

## 2012-02-08 DIAGNOSIS — F17200 Nicotine dependence, unspecified, uncomplicated: Secondary | ICD-10-CM | POA: Diagnosis present

## 2012-02-08 DIAGNOSIS — I10 Essential (primary) hypertension: Secondary | ICD-10-CM | POA: Diagnosis present

## 2012-02-08 DIAGNOSIS — R52 Pain, unspecified: Secondary | ICD-10-CM

## 2012-02-08 DIAGNOSIS — G8929 Other chronic pain: Secondary | ICD-10-CM | POA: Diagnosis present

## 2012-02-08 DIAGNOSIS — F3289 Other specified depressive episodes: Secondary | ICD-10-CM

## 2012-02-08 DIAGNOSIS — Z7982 Long term (current) use of aspirin: Secondary | ICD-10-CM

## 2012-02-08 DIAGNOSIS — Z79899 Other long term (current) drug therapy: Secondary | ICD-10-CM

## 2012-02-08 DIAGNOSIS — Z95828 Presence of other vascular implants and grafts: Secondary | ICD-10-CM

## 2012-02-08 HISTORY — DX: Intestinal adhesions (bands), unspecified as to partial versus complete obstruction: K56.50

## 2012-02-08 HISTORY — DX: Personal history of other diseases of the digestive system: Z87.19

## 2012-02-08 HISTORY — DX: Diverticulosis of intestine, part unspecified, without perforation or abscess without bleeding: K57.90

## 2012-02-08 HISTORY — DX: Diverticulosis of large intestine without perforation or abscess without bleeding: K57.30

## 2012-02-08 LAB — URINE MICROSCOPIC-ADD ON

## 2012-02-08 LAB — POCT I-STAT, CHEM 8
BUN: 19 mg/dL (ref 6–23)
Creatinine, Ser: 0.6 mg/dL (ref 0.50–1.10)
Glucose, Bld: 141 mg/dL — ABNORMAL HIGH (ref 70–99)
Hemoglobin: 15 g/dL (ref 12.0–15.0)
Sodium: 139 mEq/L (ref 135–145)
TCO2: 21 mmol/L (ref 0–100)

## 2012-02-08 LAB — CBC
HCT: 39.3 % (ref 36.0–46.0)
Hemoglobin: 13.7 g/dL (ref 12.0–15.0)
MCH: 33.7 pg (ref 26.0–34.0)
MCHC: 34.9 g/dL (ref 30.0–36.0)
MCV: 94.9 fL (ref 78.0–100.0)
MCV: 96.8 fL (ref 78.0–100.0)
Platelets: 318 10*3/uL (ref 150–400)
RBC: 4.51 MIL/uL (ref 3.87–5.11)
RDW: 12.5 % (ref 11.5–15.5)
WBC: 7.2 10*3/uL (ref 4.0–10.5)

## 2012-02-08 LAB — URINALYSIS, ROUTINE W REFLEX MICROSCOPIC
Bilirubin Urine: NEGATIVE
Specific Gravity, Urine: 1.015 (ref 1.005–1.030)
Urobilinogen, UA: 0.2 mg/dL (ref 0.0–1.0)

## 2012-02-08 LAB — COMPREHENSIVE METABOLIC PANEL
ALT: 10 U/L (ref 0–35)
AST: 18 U/L (ref 0–37)
Albumin: 3.9 g/dL (ref 3.5–5.2)
Alkaline Phosphatase: 117 U/L (ref 39–117)
CO2: 22 mEq/L (ref 19–32)
Chloride: 104 mEq/L (ref 96–112)
Creatinine, Ser: 0.41 mg/dL — ABNORMAL LOW (ref 0.50–1.10)
GFR calc non Af Amer: >90 mL/min (ref >90)
Potassium: 3.7 mEq/L (ref 3.5–5.1)
Sodium: 140 mEq/L (ref 135–145)
Total Bilirubin: 0.3 mg/dL (ref 0.3–1.2)

## 2012-02-08 LAB — PROTIME-INR
INR: 0.97 (ref 0.00–1.49)
Prothrombin Time: 12.8 seconds (ref 11.6–15.2)

## 2012-02-08 LAB — CK TOTAL AND CKMB (NOT AT ARMC)
CK, MB: 6.4 ng/mL (ref 0.3–4.0)
Relative Index: INVALID (ref 0.0–2.5)

## 2012-02-08 MED ORDER — IOHEXOL 300 MG/ML  SOLN
20.0000 mL | INTRAMUSCULAR | Status: AC
  Administered 2012-02-08 (×2): 20 mL via ORAL

## 2012-02-08 MED ORDER — ONDANSETRON HCL 4 MG/2ML IJ SOLN
4.0000 mg | Freq: Once | INTRAMUSCULAR | Status: AC
  Administered 2012-02-08: 4 mg via INTRAVENOUS
  Filled 2012-02-08: qty 2

## 2012-02-08 MED ORDER — BIOTENE DRY MOUTH MT LIQD
15.0000 mL | Freq: Two times a day (BID) | OROMUCOSAL | Status: DC
  Administered 2012-02-08 – 2012-02-18 (×20): 15 mL via OROMUCOSAL

## 2012-02-08 MED ORDER — SODIUM CHLORIDE 0.9 % IV SOLN
INTRAVENOUS | Status: DC
  Administered 2012-02-08 (×2): via INTRAVENOUS
  Administered 2012-02-08: 125 mL/h via INTRAVENOUS
  Administered 2012-02-09: 16:00:00 via INTRAVENOUS
  Administered 2012-02-09: 125 mL/h via INTRAVENOUS
  Administered 2012-02-09 (×2): via INTRAVENOUS
  Administered 2012-02-10: 125 mL/h via INTRAVENOUS
  Administered 2012-02-10 – 2012-02-12 (×5): via INTRAVENOUS

## 2012-02-08 MED ORDER — IOHEXOL 300 MG/ML  SOLN
80.0000 mL | Freq: Once | INTRAMUSCULAR | Status: AC | PRN
  Administered 2012-02-08: 80 mL via INTRAVENOUS

## 2012-02-08 MED ORDER — HYDROMORPHONE HCL PF 1 MG/ML IJ SOLN
1.0000 mg | Freq: Once | INTRAMUSCULAR | Status: AC
  Administered 2012-02-08: 1 mg via INTRAVENOUS
  Filled 2012-02-08: qty 1

## 2012-02-08 MED ORDER — NICOTINE 21 MG/24HR TD PT24
21.0000 mg | MEDICATED_PATCH | Freq: Once | TRANSDERMAL | Status: AC
  Administered 2012-02-08: 21 mg via TRANSDERMAL
  Filled 2012-02-08: qty 1

## 2012-02-08 MED ORDER — LIDOCAINE HCL 2 % EX GEL
Freq: Once | CUTANEOUS | Status: AC
  Administered 2012-02-08: 20 via TOPICAL
  Filled 2012-02-08: qty 20

## 2012-02-08 MED ORDER — LORAZEPAM 2 MG/ML IJ SOLN
1.0000 mg | Freq: Once | INTRAMUSCULAR | Status: AC
  Administered 2012-02-08: 1 mg via INTRAVENOUS
  Filled 2012-02-08: qty 1

## 2012-02-08 MED ORDER — HYDROMORPHONE HCL PF 1 MG/ML IJ SOLN: 0.5000 mg | INTRAMUSCULAR | Status: DC | PRN

## 2012-02-08 MED ORDER — ONDANSETRON HCL 4 MG/2ML IJ SOLN
4.0000 mg | Freq: Four times a day (QID) | INTRAMUSCULAR | Status: DC | PRN
  Administered 2012-02-08 (×2): 4 mg via INTRAVENOUS
  Filled 2012-02-08 (×2): qty 2

## 2012-02-08 MED ORDER — CHLORHEXIDINE GLUCONATE 0.12 % MT SOLN
15.0000 mL | Freq: Two times a day (BID) | OROMUCOSAL | Status: DC
  Administered 2012-02-08 – 2012-02-19 (×23): 15 mL via OROMUCOSAL
  Filled 2012-02-08 (×21): qty 15

## 2012-02-08 MED ORDER — HYDROMORPHONE HCL PF 1 MG/ML IJ SOLN
0.5000 mg | INTRAMUSCULAR | Status: DC | PRN
  Administered 2012-02-08: 1 mg via INTRAVENOUS
  Administered 2012-02-08: 0.5 mg via INTRAVENOUS
  Administered 2012-02-08 – 2012-02-09 (×5): 1 mg via INTRAVENOUS
  Filled 2012-02-08 (×6): qty 1

## 2012-02-08 MED ORDER — ENOXAPARIN SODIUM 40 MG/0.4ML ~~LOC~~ SOLN
40.0000 mg | SUBCUTANEOUS | Status: DC
  Administered 2012-02-08 – 2012-02-11 (×4): 40 mg via SUBCUTANEOUS
  Filled 2012-02-08 (×5): qty 0.4

## 2012-02-08 MED ORDER — HYDROMORPHONE HCL PF 1 MG/ML IJ SOLN
INTRAMUSCULAR | Status: AC
  Filled 2012-02-08: qty 1

## 2012-02-08 MED ORDER — ONDANSETRON HCL 4 MG PO TABS: 4.0000 mg | ORAL_TABLET | Freq: Four times a day (QID) | ORAL | Status: DC | PRN

## 2012-02-08 NOTE — Progress Notes (Signed)
CRITICAL VALUE ALERT  Critical value received:CKMB 6.4  Date of notification: 02/08/12  Time of notification:  1057 Critical value read back:y  Nurse who received alert:  Z Trejuan Matherne  MD notified (1st page): Nutan  Time of first page:  1030  MD notified (2nd page):Dr. Sherrine Maples  Time of second page:1055  Responding MD:  Sherrine Maples  Time MD responded:  1057

## 2012-02-08 NOTE — Consult Note (Signed)
Reason for Consult: SBO Referring Physician: Dr. Court Joy is an 71 y.o. female.  HPI: 71 yr old female who presented to [facility] this am with severe abdominal pain and nausea.  She reports to me that the pain started on Tuesday morning but progressively got worse.  She has been nauseated but denies vomiting.  This feels very similar to the SBO she had a year ago that resulted in a SBR by Dr. Jamey Ripa.  Her last BM was on Monday and it was very hard.  She denies diarrhea.    Past Medical History  Diagnosis Date  . Glaucoma   . Arthritis   . Hypertension   . Hyperlipidemia   . SBO (small bowel obstruction) 12/25/2010  . Peripheral vascular disease   . 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 Chronic lower back pain   . Chronic pain   . Chest wall pain     Past Surgical History  Procedure Date  . Eye surgery   . 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 Appendectomy   . Cholecystectomy   . Colectomy 01/2011    Dr. Jamey Ripa; "took out 12 inches of small intestiines and removed blockage"  . Abdominal hysterectomy     History reviewed. No pertinent family history.  Social History:  reports that she has been smoking Cigarettes.  She has a 25 pack-year smoking history. She has never used smokeless tobacco. She reports that she does not drink alcohol or use illicit drugs.  Allergies: No Known Allergies  Medications: I have reviewed the patient's current medications.  Results for orders placed during the hospital encounter of 02/08/12 (from the past 48 hour(s))  CBC     Status: Normal   Collection Time   02/08/12  5:08 AM      Component Value Range Comment   WBC 7.2  4.0 - 10.5 K/uL    RBC 4.51  3.87 - 5.11 MIL/uL    Hemoglobin 14.8  12.0 - 15.0 g/dL    HCT 16.1  09.6 - 04.5 %    MCV 94.9  78.0 - 100.0 fL    MCH 32.8  26.0 - 34.0 pg    MCHC 34.6  30.0 - 36.0 g/dL    RDW 40.9  81.1 - 91.4 %    Platelets 318  150 - 400 K/uL     COMPREHENSIVE METABOLIC PANEL     Status: Abnormal   Collection Time   02/08/12  5:08 AM      Component Value Range Comment   Sodium 140  135 - 145 mEq/L    Potassium 3.7  3.5 - 5.1 mEq/L    Chloride 104  96 - 112 mEq/L    CO2 22  19 - 32 mEq/L    Glucose, Bld 145 (*) 70 - 99 mg/dL    BUN 18  6 - 23 mg/dL    Creatinine, Ser 7.82 (*) 0.50 - 1.10 mg/dL    Calcium 95.6  8.4 - 10.5 mg/dL    Total Protein 8.2  6.0 - 8.3 g/dL    Albumin 3.9  3.5 - 5.2 g/dL    AST 18  0 - 37 U/L    ALT 10  0 - 35 U/L    Alkaline Phosphatase 117  39 - 117 U/L    Total Bilirubin 0.3  0.3 - 1.2 mg/dL    GFR calc non Af Amer >90  >  90 mL/min    GFR calc Af Amer >90  >90 mL/min   LIPASE, BLOOD     Status: Normal   Collection Time   02/08/12  5:08 AM      Component Value Range Comment   Lipase 36  11 - 59 U/L   POCT I-STAT, CHEM 8     Status: Abnormal   Collection Time   02/08/12  5:27 AM      Component Value Range Comment   Sodium 139  135 - 145 mEq/L    Potassium 3.6  3.5 - 5.1 mEq/L    Chloride 107  96 - 112 mEq/L    BUN 19  6 - 23 mg/dL    Creatinine, Ser 1.61  0.50 - 1.10 mg/dL    Glucose, Bld 096 (*) 70 - 99 mg/dL    Calcium, Ion 0.45  4.09 - 1.30 mmol/L    TCO2 21  0 - 100 mmol/L    Hemoglobin 15.0  12.0 - 15.0 g/dL    HCT 81.1  91.4 - 78.2 %   PROTIME-INR     Status: Normal   Collection Time   02/08/12  8:11 AM      Component Value Range Comment   Prothrombin Time 12.8  11.6 - 15.2 seconds    INR 0.97  0.00 - 1.49     Ct Abdomen Pelvis W Contrast  02/08/2012  *RADIOLOGY REPORT*  Clinical Data: Lower abdominal pain and vomiting.  CT ABDOMEN AND PELVIS WITH CONTRAST  Technique:  Multidetector CT imaging of the abdomen and pelvis was performed following the standard protocol during bolus administration of intravenous contrast.  Contrast: 80mL OMNIPAQUE IOHEXOL 300 MG/ML  SOLN  Comparison: CT scan 12/17/2010.  Findings: The lung bases are clear except for dependent atelectasis.  The liver  demonstrates stable intra hepatic biliary dilatation likely due to prior cholecystectomy.  The common bile duct is also mildly dilated.  No focal hepatic lesions.  The spleen is normal in size.  No focal lesions.  The pancreas is unremarkable.  The adrenal glands and kidneys are normal.  The stomach is distended with contrast.  No gross abnormalities. The duodenum is normal.  The proximal and mid small bowel is dilated.  There is a transition in the mid pelvis to normal/decompressed small bowel loops.  Findings consistent with a mid distal small bowel obstruction due to adhesions.  The colon is unremarkable except for scattered diverticulosis.  There are surgical changes involving the mid small bowel in the left lower quadrant.  The aorta demonstrates moderate atherosclerotic changes.  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  No complicating features.  No mesenteric or retroperitoneal mass or adenopathy.  The bladder is unremarkable.  The uterus is surgically absent.  No pelvic mass, adenopathy or significant free pelvic fluid collection.  No inguinal mass or hernia.  The bony structures are intact.  IMPRESSION: Mid distal small bowel obstruction with transition in the mid pelvis likely due to adhesions.   Original Report Authenticated By: Rudie Meyer, M.D.     Review of Systems  Constitutional: Positive for malaise/fatigue. Negative for fever and chills.  HENT: Negative.   Eyes: Negative.   Respiratory: Negative.   Cardiovascular: Negative.   Gastrointestinal: Positive for nausea, abdominal pain and constipation. Negative for vomiting and diarrhea.       Feeling bloated   Genitourinary: Positive for urgency. Negative for dysuria.       Hesitation   Musculoskeletal: Negative.   Skin:  Negative.   Neurological: Negative.   Endo/Heme/Allergies: Negative.   Psychiatric/Behavioral: Positive for depression. Negative for suicidal ideas. The patient is not nervous/anxious.    Blood pressure 160/78, pulse 89,  temperature 98.6 F (37 C), temperature source Oral, resp. rate 15, SpO2 96.00%. Physical Exam  Constitutional: She is oriented to person, place, and time. She appears well-developed and well-nourished. No distress.       Drowsy In pain at times  HENT:  Head: Normocephalic and atraumatic.  Eyes: Conjunctivae normal are normal. Pupils are equal, round, and reactive to light.  Neck: Normal range of motion. Neck supple.  Cardiovascular: Normal rate and regular rhythm.   Respiratory: Effort normal and breath sounds normal.  GI: She exhibits no mass. Distention: mildly. There is tenderness (Tender esp around umbilicus and epigastric). There is no guarding.       Only faint bowel sounds  Midline scar with suture in midline just under the skin   Genitourinary:       Deferred   Musculoskeletal: She exhibits no edema and no tenderness.  Neurological: She is alert and oriented to person, place, and time.  Skin: Skin is warm and dry.  Psychiatric: She has a normal mood and affect. Her behavior is normal.    Assessment/Plan: 1. SBO: the patient will be admitted to the IM teaching service.  An NGT has already been placed.  We will repeat abd films in the AM.  We will try conservative measures at this time but explained to the patient that surgical intervention may become necessary if her symptoms and films do not improve.  Keep NPO, IVFs.  Will increase her dilaudid to .5-1mg  q2hrs.  Will order abd films for the AM.    Desarae Placide 02/08/2012, 10:09 AM

## 2012-02-08 NOTE — Consult Note (Signed)
SBO likely due to adhesions. Improved somewhat with NGT.  Will see how she does over the next 24h.  Just mild tenderness without guarding at this time. Patient examined and I agree with the assessment and plan  Violeta Gelinas, MD, MPH, FACS Pager: [phone]  02/08/2012 12:11 PM

## 2012-02-08 NOTE — ED Notes (Signed)
PT. REPORTS GENERALIZED ABDOMINAL PAIN / LEFT CHEST PAIN FOR SEVERAL DAYS WITH NAUSEA , SEEN HERE LAST 11/23 DIAGNOSED WITH CHEST WALL PAIN ( CT ANGIO CHEST/X-RAY DONE ) DISCHARGED HOME.

## 2012-02-08 NOTE — ED Notes (Signed)
NG tube placement verified by auscultation by Alcario Drought, RN. Placed on low intermittent suction. Consulting MD at bedside

## 2012-02-08 NOTE — H&P (Signed)
Hospital Admission Note Date: 02/08/2012  Patient name: Jessica [name] Medical record number: [id number] Date of birth: 1940/12/01 Age: 71 y.o. Gender: female PCP: [name], MD  Medical Service: Internal Medicaine Teaching Service  Attending physician:  Dr. Lonzo Cloud    1st Contact: Dr. Sherrine Maples   Pager: [phone] 2nd Contact: Dr. Bosie Clos   Pager: [phone] After 5 pm or weekends: 1st Contact:      Pager: [phone] 2nd Contact:      Pager: [phone]  Chief Complaint: Small bowel obstruction  History of Present Illness: 71yo F with a h/o HIV, HTN, and previous abdominal surgeries resulting in SOB, s/p bowel resection 10/12, presents today with sharp abdominal pain. She states that she woke up on Tuesday at 2am with sharp abdominal pain located around her umbilicus and left flank pain. She had one episode of brown clear liquid emesis in the ED, but denies any other episodes. Her last bowel movement was 2 days ago; she denies flatus. She states that her sx are similar to her previous bowel obstruction but less severe.  Of note, she was recently seen in the ED on 11/26 for E. Coli positive UTI, and was treated with abx. Per pt, her urinary sx have resolved but she does c/o left flank pain.  Meds: Current Outpatient Rx  Name  Route  Sig  Dispense  Refill  . ACETAMINOPHEN 500 MG PO TABS   Oral   Take 500 mg by mouth every 4 (four) hours as needed. For pain         . ACYCLOVIR 400 MG PO TABS   Oral   Take 1 tablet (400 mg total) by mouth 3 (three) times daily. as needed for herpes outbreak   21 tablet   prn   . ALPRAZOLAM 1 MG PO TABS   Oral   Take 1 mg by mouth 3 (three) times daily as needed. For anxiety         . ASPIRIN 81 MG PO TBEC   Oral   Take 81 mg by mouth daily.           Marland Kitchen BENAZEPRIL HCL 40 MG PO TABS   Oral   Take 40 mg by mouth at bedtime.         . CYCLOBENZAPRINE HCL 10 MG PO TABS   Oral   Take 1 tablet (10 mg total) by mouth 3 (three) times daily  as needed. For spasms   90 tablet   5   . EFAVIRENZ-EMTRICITAB-TENOFOVIR 600-200-300 MG PO TABS   Oral   Take 1 tablet by mouth at bedtime.   30 tablet   prn   . ENSURE PO LIQD   Oral   Take 237 mLs by mouth 2 (two) times daily.   237 mL   11   . FENTANYL 50 MCG/HR TD PT72   Transdermal   Place 1 patch (50 mcg total) onto the skin every 3 (three) days.   10 patch   0   . HYDROCODONE-ACETAMINOPHEN 10-650 MG PO TABS   Oral   Take 1 tablet by mouth 2 (two) times daily as needed for pain. PRN   60 tablet   2   . LOVASTATIN 40 MG PO TABS   Oral   Take 1 tablet (40 mg total) by mouth at bedtime.   30 tablet   5   . ADULT MULTIVITAMIN W/MINERALS CH   Oral   Take 1 tablet by mouth daily.         Marland Kitchen  SENNOSIDES-DOCUSATE SODIUM 8.6-50 MG PO TABS   Oral   Take 2 tablets by mouth 2 (two) times daily.   120 tablet   3   . TRAVOPROST (BAK FREE) 0.004 % OP SOLN   Both Eyes   Place 1 drop into both eyes at bedtime.   1 Bottle   0   . TRIAMCINOLONE ACETONIDE 0.1 % EX OINT   Topical   Apply 1 application topically 2 (two) times daily.   454 g   3     Allergies: Allergies as of 02/08/2012  . (No Known Allergies)   Past Medical History  Diagnosis Date  . Glaucoma   . Arthritis   . Hypertension   . Hyperlipidemia   . SBO (small bowel obstruction) 12/25/2010  . Peripheral vascular disease   . 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 Chronic lower back pain   . Chronic pain   . Chest wall pain    Past Surgical History  Procedure Date  . Eye surgery   . 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 Appendectomy   . Cholecystectomy   . Colectomy 01/2011    Dr. Jamey Ripa; "took out 12 inches of small intestiines and removed blockage"  . Abdominal hysterectomy    No family history on file. History   Social History  . Marital Status: Widowed    Spouse Name: N/A    Number of Children: N/A  . Years of Education: N/A    Occupational History  .      Social History Main Topics  . Smoking status: Current Every Day Smoker -- 0.5 packs/day for 50 years    Types: Cigarettes    Last Attempt to Quit: 12/24/2010  . Smokeless tobacco: Never Used     Comment: "stopped 06/2010; started back up Thanksgiving 2012"  . Alcohol Use: No     Comment: "last drink of alcohol ~ 1977"  . Drug Use: No  . Sexually Active: Not on file   Other Topics Concern  . Not on file   Social History Narrative  . No narrative on file    Review of Systems: Constitutional: Denies fever, chills, diaphoresis, appetite change or fatigue.  HEENT: Denies photophobia, eye pain, redness, hearing loss, mouth sores, trouble swallowing, neck pain. Respiratory: Denies SOB, DOE, cough, chest tightness, or wheezing.  Cardiovascular: Denies chest pain, palpitations or leg swelling.  Gastrointestinal: + Nausea, vomiting, abdominal pain, and abdominal distention.  Genitourinary: + left flank pain and hesitancy with urination. Denies dysuria.  Musculoskeletal: Denies myalgias, joint swelling, or gait problem.  Skin: Denies pallor, rash and wound.  Neurological: Denies dizziness, seizures, light-headedness, numbness or headaches.  Psychiatric/Behavioral: +Depression. Denies suicidal ideation, or mood changes  Physical Exam: Blood pressure 146/74, pulse 77, temperature 98.6 F (37 C), temperature source Oral, resp. rate 16, SpO2 98.00%. General: Well-developed, and cooperative on examination.  Head: Normocephalic and atraumatic.  Eyes: Pupils equal, round, and reactive to light, conjunctiva injected from crying.  Mouth: Pharynx pink and moist, no erythema or exudates.  Neck: Supple, full ROM,no JVD.  Lungs: CTAB, normal respiratory effort, no accessory muscle use, no crackles, and no wheezes. Heart: Tachycardic, regular rhythm, no murmur, no gallop, and no rub.  Abdomen: Soft, mild distention, TTP at umbilicus and lower quadrants, hypoactive  bowel sounds, no organomegaly or masses appreciated.  Msk: No joint swelling, warmth, or erythema.  Extremities: 2+ radial and DP pulses bilaterally. Neurologic: Alert &  oriented X3, cranial nerves II-XII intact, strength normal in all extremities Skin: Turgor normal and no rashes.  Psych: Memory intact for recent and remote, normally interactive, good eye contact, not anxious or depressed appearing.   Lab results: Basic Metabolic Panel:  Basename 02/08/12 0527 02/08/12 0508  NA 139 140  K 3.6 3.7  CL 107 104  CO2 -- 22  GLUCOSE 141* 145*  BUN 19 18  CREATININE 0.60 0.41*  CALCIUM -- 10.1  MG -- --  PHOS -- --   Liver Function Tests:  [facility] 02/08/12 0508  AST 18  ALT 10  ALKPHOS 117  BILITOT 0.3  PROT 8.2  ALBUMIN 3.9    Basename 02/08/12 0508  LIPASE 36  AMYLASE --   CBC:  Basename 02/08/12 0527 02/08/12 0508  WBC -- 7.2  NEUTROABS -- --  HGB 15.0 14.8  HCT 44.0 42.8  MCV -- 94.9  PLT -- 318    Imaging results:  Ct Abdomen Pelvis W Contrast  02/08/2012  *RADIOLOGY REPORT*  Clinical Data: Lower abdominal pain and vomiting.  CT ABDOMEN AND PELVIS WITH CONTRAST  Technique:  Multidetector CT imaging of the abdomen and pelvis was performed following the standard protocol during bolus administration of intravenous contrast.  Contrast: 80mL OMNIPAQUE IOHEXOL 300 MG/ML  SOLN  Comparison: CT scan 12/17/2010.  Findings: The lung bases are clear except for dependent atelectasis.  The liver demonstrates stable intra hepatic biliary dilatation likely due to prior cholecystectomy.  The common bile duct is also mildly dilated.  No focal hepatic lesions.  The spleen is normal in size.  No focal lesions.  The pancreas is unremarkable.  The adrenal glands and kidneys are normal.  The stomach is distended with contrast.  No gross abnormalities. The duodenum is normal.  The proximal and mid small bowel is dilated.  There is a transition in the mid pelvis to normal/decompressed  small bowel loops.  Findings consistent with a mid distal small bowel obstruction due to adhesions.  The colon is unremarkable except for scattered diverticulosis.  There are surgical changes involving the mid small bowel in the left lower quadrant.  The aorta demonstrates moderate atherosclerotic changes.  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  No complicating features.  No mesenteric or retroperitoneal mass or adenopathy.  The bladder is unremarkable.  The uterus is surgically absent.  No pelvic mass, adenopathy or significant free pelvic fluid collection.  No inguinal mass or hernia.  The bony structures are intact.  IMPRESSION: Mid distal small bowel obstruction with transition in the mid pelvis likely due to adhesions.   Original Report Authenticated By: Rudie Meyer, M.D.     Other results: EKG: Unchanged from previous  Assessment & Plan by Problem:  1. Small bowel obstruction, symptomatic: Pt with h/o of multiple abdominal surgeries and is s/p small bowel resection 10/12. She presents today with symptoms of a SBO, similar to her previous SBO but less severe. CT scan shows mild distal small bowel obstruction with transition in the mid pelvis likely due to adhesions. Of note, last colonoscopy 5/12 with extensive left sided diverticula. Pt has been made NPO, and NGT was placed in the ED with minimal light green, clear output. Will continue to keep the pt NPO with NG decompression and pain management. Surgery has been consulted; will await their assessment. - Admit to IMTS - NPO - NS@125  - Dilaudid 0.5mg  q3h PRN - CBC  2. HIV, well controlled: Pt is a pt of Dr. Bonnetta Barry. She  last saw Dr. Maurice March in October, and endorses compliance with her medications; she is on Atripla.  Her last CD4 count was 1210 with a viral load <40 on 06/15/11 . While she is NPO with an NG tube, will hold her medications for now and resume once able to tolerate oral medications. - Checking CD4 and viral load  3. HTN: Pt on Benazepril  at home for her HTN. BP on presentation to the ED was very elevated to 193/91. I t has improved to 160/78. Will continue to monitor and treat if it continues to remain elevated.  4. Acute on chronic voiding dysfucntion: S/p meatal dilation and cystoscopy in '05 per Dr. Brunilda Payor with Urology. She still c/o of hesitancy while urinating but denies dysuria. Pt seen in the ED 11/26 and treated for E. coli positive UTI. She denies any further urinary symptoms, but does c/o left flank pain. Will check urine cultures to make sure infection has cleared.  5. DVT PPX: [place] Lovenox    Dispo: Disposition is deferred at this time, awaiting improvement of current medical problems. Anticipated discharge in approximately 2-3 day(s).   The patient does have a current PCP [name], MD), therefore will be requiring OPC follow-up after discharge.   The patient does not have transportation limitations that hinder transportation to clinic appointments.  Signed: Genelle Gather 02/08/2012, 9:34 AM

## 2012-02-08 NOTE — ED Notes (Signed)
Pt states that yesterday she woke up around 2 am with abdominal pain. Pt experienced NV x 1 once placed in room. Pt states that pain is located directly above umbilicus.

## 2012-02-08 NOTE — H&P (Signed)
Internal Medicine teaching Service Attending Dr.Kalan Rinn. I have personally examined the patient and reviewed the h and P documented by the Resident. In brief  Chief complaint: Abdominal pain HOPI: 71 yr old with h/o abdominal surgeries s/p bowel resection presents with abdominal pain. 9 point review of system as documented in the Resident note. Social history admitting medication family history past surgical history allergies reviewed. Physical examination Notable for:  Complains of significant pain, depression Present Labs are significant for : Normal for sugar. Imaging is significant for: Mid distal SBO with transition. EKG: unchanged from previous A and P: Abdomen: continue supportive treatment. Will increase dilaudid since no pain control. Rest per resident documentation.

## 2012-02-08 NOTE — ED Notes (Signed)
General surgery at bedside. 

## 2012-02-08 NOTE — ED Provider Notes (Signed)
History     CSN: [id number]  Arrival date & time 02/08/12  0440   First MD Initiated Contact with Patient 02/08/12 0507      Chief Complaint  Patient presents with  . Abdominal Pain    (Consider location/radiation/quality/duration/timing/severity/associated sxs/prior treatment) HPI History provided by patient. Evaluated here last week for some left-sided sharp chest pain. She still has this chest pain that is somewhat better, not affected by breathing or movement. No associated shortness of breath. Today she developed severe 10 over 10 lower abdominal pain with associated vomiting. Her last bowel movement was the day before yesterday. No blood in stools. Has history of multiple abdominal surgeries and bowel obstruction in the past. No known aggravating or alleviating factors. He has been constant since onset today. Pain is sharp in quality and not radiating from lower abdomen right and left side. No dysuria, urgency or frequency. No fevers or chills. Past Medical History  Diagnosis Date  . Glaucoma   . Arthritis   . Hypertension   . Hyperlipidemia   . SBO (small bowel obstruction) 12/25/2010  . Peripheral vascular disease   . 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 Chronic lower back pain   . Chronic pain   . Chest wall pain     Past Surgical History  Procedure Date  . Eye surgery   . 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 Appendectomy   . Cholecystectomy   . Colectomy 01/2011    Dr. Jamey Ripa; "took out 12 inches of small intestiines and removed blockage"  . Abdominal hysterectomy     No family history on file.  History  Substance Use Topics  . Smoking status: Current Every Day Smoker -- 0.5 packs/day for 50 years    Types: Cigarettes    Last Attempt to Quit: 12/24/2010  . Smokeless tobacco: Never Used     Comment: "stopped 06/2010; started back up Thanksgiving 2012"  . Alcohol Use: No     Comment: "last drink of alcohol ~ 1977"     OB History    Grav Para Term Preterm Abortions TAB SAB Ect Mult Living                  Review of Systems  Constitutional: Negative for fever and chills.  HENT: Negative for neck pain and neck stiffness.   Eyes: Negative for pain.  Respiratory: Negative for shortness of breath.   Cardiovascular: Negative for palpitations and leg swelling.  Gastrointestinal: Positive for nausea, vomiting and abdominal pain.  Genitourinary: Negative for dysuria.  Musculoskeletal: Negative for back pain.  Skin: Negative for rash.  Neurological: Negative for headaches.  All other systems reviewed and are negative.    Allergies  Review of patient's allergies indicates no known allergies.  Home Medications   Current Outpatient Rx  Name  Route  Sig  Dispense  Refill  . ACETAMINOPHEN 500 MG PO TABS   Oral   Take 500 mg by mouth every 4 (four) hours as needed. For pain         . ACYCLOVIR 400 MG PO TABS   Oral   Take 1 tablet (400 mg total) by mouth 3 (three) times daily. as needed for herpes outbreak   21 tablet   prn   . ALPRAZOLAM 1 MG PO TABS   Oral   Take 1 mg by mouth 3 (three) times daily as needed. For anxiety         .  ASPIRIN 81 MG PO TBEC   Oral   Take 81 mg by mouth daily.           Marland Kitchen BENAZEPRIL HCL 40 MG PO TABS   Oral   Take 40 mg by mouth at bedtime.         . CYCLOBENZAPRINE HCL 10 MG PO TABS   Oral   Take 1 tablet (10 mg total) by mouth 3 (three) times daily as needed. For spasms   90 tablet   5   . EFAVIRENZ-EMTRICITAB-TENOFOVIR 600-200-300 MG PO TABS   Oral   Take 1 tablet by mouth at bedtime.   30 tablet   prn   . ENSURE PO LIQD   Oral   Take 237 mLs by mouth 2 (two) times daily.   237 mL   11   . FENTANYL 50 MCG/HR TD PT72   Transdermal   Place 1 patch (50 mcg total) onto the skin every 3 (three) days.   10 patch   0   . HYDROCODONE-ACETAMINOPHEN 10-650 MG PO TABS   Oral   Take 1 tablet by mouth 2 (two) times daily as needed for  pain. PRN   60 tablet   2   . LOVASTATIN 40 MG PO TABS   Oral   Take 1 tablet (40 mg total) by mouth at bedtime.   30 tablet   5   . ADULT MULTIVITAMIN W/MINERALS CH   Oral   Take 1 tablet by mouth daily.         Bernadette Hoit SODIUM 8.6-50 MG PO TABS   Oral   Take 2 tablets by mouth 2 (two) times daily.   120 tablet   3   . TRAVOPROST (BAK FREE) 0.004 % OP SOLN   Both Eyes   Place 1 drop into both eyes at bedtime.   1 Bottle   0   . TRIAMCINOLONE ACETONIDE 0.1 % EX OINT   Topical   Apply 1 application topically 2 (two) times daily.   454 g   3     BP 193/91  Pulse 88  Temp 98.6 F (37 C) (Oral)  Resp 16  SpO2 99%  Physical Exam  Nursing note and vitals reviewed. Constitutional: She is oriented to person, place, and time. She appears well-developed and well-nourished.  HENT:  Head: Normocephalic and atraumatic.  Eyes: EOM are normal. Pupils are equal, round, and reactive to light. No scleral icterus.  Neck: Neck supple.  Cardiovascular: Normal rate, normal heart sounds and intact distal pulses.   Pulmonary/Chest: Effort normal. No respiratory distress. She has no wheezes. She has no rales.       reproducible left lower lateral chest wall tenderness. No rash or crepitus  Abdominal:       Distention with decreased bowel sounds and tender diffusely and more so over right and left lower quadrants.  Musculoskeletal: Normal range of motion. She exhibits no edema.  Neurological: She is alert and oriented to person, place, and time.  Skin: Skin is warm and dry.    ED Course  Procedures (including critical care time)  Results for orders placed during the hospital encounter of 02/08/12  CBC      Component Value Range   WBC 7.2  4.0 - 10.5 K/uL   RBC 4.51  3.87 - 5.11 MIL/uL   Hemoglobin 14.8  12.0 - 15.0 g/dL   HCT 13.0  86.5 - 78.4 %   MCV 94.9  78.0 - 100.0  fL   MCH 32.8  26.0 - 34.0 pg   MCHC 34.6  30.0 - 36.0 g/dL   RDW 40.9  81.1 - 91.4 %    Platelets 318  150 - 400 K/uL  COMPREHENSIVE METABOLIC PANEL      Component Value Range   Sodium 140  135 - 145 mEq/L   Potassium 3.7  3.5 - 5.1 mEq/L   Chloride 104  96 - 112 mEq/L   CO2 22  19 - 32 mEq/L   Glucose, Bld 145 (*) 70 - 99 mg/dL   BUN 18  6 - 23 mg/dL   Creatinine, Ser 7.82 (*) 0.50 - 1.10 mg/dL   Calcium 95.6  8.4 - 21.3 mg/dL   Total Protein 8.2  6.0 - 8.3 g/dL   Albumin 3.9  3.5 - 5.2 g/dL   AST 18  0 - 37 U/L   ALT 10  0 - 35 U/L   Alkaline Phosphatase 117  39 - 117 U/L   Total Bilirubin 0.3  0.3 - 1.2 mg/dL   GFR calc non Af Amer >90  >90 mL/min   GFR calc Af Amer >90  >90 mL/min  LIPASE, BLOOD      Component Value Range   Lipase 36  11 - 59 U/L  POCT I-STAT, CHEM 8      Component Value Range   Sodium 139  135 - 145 mEq/L   Potassium 3.6  3.5 - 5.1 mEq/L   Chloride 107  96 - 112 mEq/L   BUN 19  6 - 23 mg/dL   Creatinine, Ser 0.86  0.50 - 1.10 mg/dL   Glucose, Bld 578 (*) 70 - 99 mg/dL   Calcium, Ion 4.69  6.29 - 1.30 mmol/L   TCO2 21  0 - 100 mmol/L   Hemoglobin 15.0  12.0 - 15.0 g/dL   HCT 52.8  41.3 - 24.4 %   Dg Chest 2 View  02/03/2012  *RADIOLOGY REPORT*  Clinical Data: Chest pain, shortness of breath and cough.  CHEST - 2 VIEW  Comparison: 12/15/2010  Findings: Stable appearance of chronic lung disease.  No edema, pneumothorax, infiltrate or nodules detected.  No evidence of pleural effusion.  The heart size and mediastinal contours within normal limits.  The bony thorax shows mild degenerative changes of the spine and scoliosis.  IMPRESSION: Stable chronic lung disease.  No acute findings.   Original Report Authenticated By: Irish Lack, M.D.    Dg Shoulder Right  02/03/2012  *RADIOLOGY REPORT*  Clinical Data: Recent fall with right shoulder pain.  RIGHT SHOULDER - 2+ VIEW  Comparison: None.  Findings: No acute fracture or dislocation.  Mild degenerative changes are seen involving the humeral head.  The [facility] joint shows normal alignment.  No soft  tissue abnormalities.  No bony lesions or obstruction.  IMPRESSION: No acute fracture or dislocation.   Original Report Authenticated By: Irish Lack, M.D.    Ct Angio Chest Pe W/cm &/or Wo Cm  02/03/2012  *RADIOLOGY REPORT*  Clinical Data: Left-sided chest pain.  CT ANGIOGRAPHY CHEST  Technique:  Multidetector CT imaging of the chest using the standard protocol during bolus administration of intravenous contrast. Multiplanar reconstructed images including MIPs were obtained and reviewed to evaluate the vascular anatomy.  Contrast: 65mL OMNIPAQUE IOHEXOL 350 MG/ML SOLN  Comparison: Chest x-ray earlier today.  Findings: The pulmonary arteries are adequately opacified.  There is no evidence of pulmonary embolism.  Lungs show scattered atelectasis and scarring as well  as chronic lung disease bilaterally.  Calcified coronary atherosclerotic plaque is noted in the distribution of the LAD.  There likely also is calcification associated with the left circumflex coronary artery which is less well defined due to motion artifact.  Overall heart size is normal.  No pleural or pericardial effusion.  No enlarged lymph nodes or focal pulmonary masses are identified.  The visualized airway is normally patent.  Upper abdomen is unremarkable.  The spine demonstrates degenerative changes as well as kyphosis without evidence of compression fracture or bony lesions.  IMPRESSION:  1.  No evidence of acute pulmonary embolism. 2.  Chronic lung disease with scattered areas of parenchymal scarring and atelectasis bilaterally. 3.  Coronary atherosclerosis with calcified plaque noted in the distribution of the LAD and also suspected in the distribution of the left circumflex coronary artery.   Original Report Authenticated By: Irish Lack, M.D.    Ct Abdomen Pelvis W Contrast  02/08/2012  *RADIOLOGY REPORT*  Clinical Data: Lower abdominal pain and vomiting.  CT ABDOMEN AND PELVIS WITH CONTRAST  Technique:  Multidetector CT  imaging of the abdomen and pelvis was performed following the standard protocol during bolus administration of intravenous contrast.  Contrast: 80mL OMNIPAQUE IOHEXOL 300 MG/ML  SOLN  Comparison: CT scan 12/17/2010.  Findings: The lung bases are clear except for dependent atelectasis.  The liver demonstrates stable intra hepatic biliary dilatation likely due to prior cholecystectomy.  The common bile duct is also mildly dilated.  No focal hepatic lesions.  The spleen is normal in size.  No focal lesions.  The pancreas is unremarkable.  The adrenal glands and kidneys are normal.  The stomach is distended with contrast.  No gross abnormalities. The duodenum is normal.  The proximal and mid small bowel is dilated.  There is a transition in the mid pelvis to normal/decompressed small bowel loops.  Findings consistent with a mid distal small bowel obstruction due to adhesions.  The colon is unremarkable except for scattered diverticulosis.  There are surgical changes involving the mid small bowel in the left lower quadrant.  The aorta demonstrates moderate atherosclerotic changes.  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  No complicating features.  No mesenteric or retroperitoneal mass or adenopathy.  The bladder is unremarkable.  The uterus is surgically absent.  No pelvic mass, adenopathy or significant free pelvic fluid collection.  No inguinal mass or hernia.  The bony structures are intact.  IMPRESSION: Mid distal small bowel obstruction with transition in the mid pelvis likely due to adhesions.   Original Report Authenticated By: Rudie Meyer, M.D.      Date: 02/08/2012  Rate: 89  Rhythm: normal sinus rhythm  QRS Axis: normal  Intervals: normal  ST/T Wave abnormalities: nonspecific ST changes  Conduction Disutrbances:none  Narrative Interpretation:   Old EKG Reviewed: unchanged  IV Dilaudid. IV Zofran. IV fluids. NGT 7:47 AM d/w [facility] resident on call will admit. General surgery consulted.   MDM   SBO  Evaluated with CT scan of abdomen as above demonstrating small bowel obstruction. NG tube placed. IV fluids. IV Zofran. IV Dilaudid. Medicine consult for admission. General surgery consulted as well.        Sunnie Nielsen, MD 02/08/12 [phone]

## 2012-02-09 ENCOUNTER — Observation Stay (HOSPITAL_COMMUNITY): Payer: Medicare Other

## 2012-02-09 DIAGNOSIS — K56609 Unspecified intestinal obstruction, unspecified as to partial versus complete obstruction: Secondary | ICD-10-CM | POA: Diagnosis not present

## 2012-02-09 DIAGNOSIS — K5900 Constipation, unspecified: Secondary | ICD-10-CM | POA: Diagnosis present

## 2012-02-09 DIAGNOSIS — K565 Intestinal adhesions [bands], unspecified as to partial versus complete obstruction: Secondary | ICD-10-CM | POA: Diagnosis not present

## 2012-02-09 DIAGNOSIS — H409 Unspecified glaucoma: Secondary | ICD-10-CM | POA: Diagnosis not present

## 2012-02-09 DIAGNOSIS — F329 Major depressive disorder, single episode, unspecified: Secondary | ICD-10-CM | POA: Diagnosis present

## 2012-02-09 DIAGNOSIS — R142 Eructation: Secondary | ICD-10-CM | POA: Diagnosis not present

## 2012-02-09 DIAGNOSIS — E876 Hypokalemia: Secondary | ICD-10-CM | POA: Diagnosis not present

## 2012-02-09 DIAGNOSIS — G8929 Other chronic pain: Secondary | ICD-10-CM | POA: Diagnosis present

## 2012-02-09 DIAGNOSIS — Z4682 Encounter for fitting and adjustment of non-vascular catheter: Secondary | ICD-10-CM | POA: Diagnosis not present

## 2012-02-09 DIAGNOSIS — R079 Chest pain, unspecified: Secondary | ICD-10-CM | POA: Diagnosis not present

## 2012-02-09 DIAGNOSIS — G894 Chronic pain syndrome: Secondary | ICD-10-CM | POA: Diagnosis not present

## 2012-02-09 DIAGNOSIS — K560 Paralytic ileus: Secondary | ICD-10-CM | POA: Diagnosis present

## 2012-02-09 DIAGNOSIS — F17200 Nicotine dependence, unspecified, uncomplicated: Secondary | ICD-10-CM | POA: Diagnosis present

## 2012-02-09 DIAGNOSIS — J9819 Other pulmonary collapse: Secondary | ICD-10-CM | POA: Diagnosis not present

## 2012-02-09 DIAGNOSIS — Z21 Asymptomatic human immunodeficiency virus [HIV] infection status: Secondary | ICD-10-CM | POA: Diagnosis not present

## 2012-02-09 DIAGNOSIS — E46 Unspecified protein-calorie malnutrition: Secondary | ICD-10-CM | POA: Diagnosis not present

## 2012-02-09 DIAGNOSIS — K5730 Diverticulosis of large intestine without perforation or abscess without bleeding: Secondary | ICD-10-CM | POA: Diagnosis not present

## 2012-02-09 DIAGNOSIS — R141 Gas pain: Secondary | ICD-10-CM | POA: Diagnosis not present

## 2012-02-09 DIAGNOSIS — F3289 Other specified depressive episodes: Secondary | ICD-10-CM | POA: Diagnosis not present

## 2012-02-09 DIAGNOSIS — Z01818 Encounter for other preprocedural examination: Secondary | ICD-10-CM | POA: Diagnosis not present

## 2012-02-09 DIAGNOSIS — Z79899 Other long term (current) drug therapy: Secondary | ICD-10-CM | POA: Diagnosis not present

## 2012-02-09 DIAGNOSIS — I10 Essential (primary) hypertension: Secondary | ICD-10-CM | POA: Diagnosis not present

## 2012-02-09 DIAGNOSIS — Z7982 Long term (current) use of aspirin: Secondary | ICD-10-CM | POA: Diagnosis not present

## 2012-02-09 LAB — CBC
HCT: 35.9 % — ABNORMAL LOW (ref 36.0–46.0)
Hemoglobin: 12.1 g/dL (ref 12.0–15.0)
RBC: 3.71 MIL/uL — ABNORMAL LOW (ref 3.87–5.11)
WBC: 5.9 10*3/uL (ref 4.0–10.5)

## 2012-02-09 LAB — BASIC METABOLIC PANEL
BUN: 17 mg/dL (ref 6–23)
Chloride: 107 mEq/L (ref 96–112)
GFR calc Af Amer: >90 mL/min (ref >90)
GFR calc non Af Amer: >90 mL/min (ref >90)
Potassium: 3.2 mEq/L — ABNORMAL LOW (ref 3.5–5.1)
Sodium: 139 mEq/L (ref 135–145)

## 2012-02-09 MED ORDER — PROMETHAZINE HCL 25 MG/ML IJ SOLN: 12.5000 mg | Freq: Once | INTRAMUSCULAR | Status: DC

## 2012-02-09 MED ORDER — ONDANSETRON HCL 4 MG/2ML IJ SOLN: 4.0000 mg | Freq: Four times a day (QID) | INTRAMUSCULAR | Status: DC

## 2012-02-09 MED ORDER — NICOTINE 21 MG/24HR TD PT24
21.0000 mg | MEDICATED_PATCH | Freq: Every day | TRANSDERMAL | Status: DC
  Administered 2012-02-09 – 2012-02-19 (×11): 21 mg via TRANSDERMAL
  Filled 2012-02-09 (×11): qty 1

## 2012-02-09 MED ORDER — PROMETHAZINE HCL 25 MG/ML IJ SOLN: 12.5000 mg | Freq: Four times a day (QID) | INTRAMUSCULAR | Status: DC | PRN

## 2012-02-09 MED ORDER — POTASSIUM CHLORIDE 10 MEQ/100ML IV SOLN
10.0000 meq | INTRAVENOUS | Status: AC
Start: 2012-02-09 — End: 2012-02-09
  Administered 2012-02-09 (×4): 10 meq via INTRAVENOUS
  Filled 2012-02-09: qty 400

## 2012-02-09 MED ORDER — ONDANSETRON HCL 4 MG/2ML IJ SOLN
4.0000 mg | Freq: Four times a day (QID) | INTRAMUSCULAR | Status: DC
  Administered 2012-02-09 – 2012-02-12 (×13): 4 mg via INTRAVENOUS
  Filled 2012-02-09 (×13): qty 2

## 2012-02-09 MED ORDER — FENTANYL 50 MCG/HR TD PT72
50.0000 ug | MEDICATED_PATCH | TRANSDERMAL | Status: DC
  Administered 2012-02-09 – 2012-02-18 (×4): 50 ug via TRANSDERMAL
  Filled 2012-02-09 (×5): qty 1

## 2012-02-09 MED ORDER — HYDROMORPHONE HCL PF 1 MG/ML IJ SOLN
2.0000 mg | INTRAMUSCULAR | Status: DC | PRN
  Administered 2012-02-09 – 2012-02-12 (×27): 2 mg via INTRAVENOUS
  Filled 2012-02-09 (×2): qty 2
  Filled 2012-02-09: qty 1
  Filled 2012-02-09 (×2): qty 2
  Filled 2012-02-09: qty 1
  Filled 2012-02-09 (×9): qty 2
  Filled 2012-02-09: qty 1
  Filled 2012-02-09 (×6): qty 2
  Filled 2012-02-09: qty 1
  Filled 2012-02-09 (×7): qty 2

## 2012-02-09 NOTE — Progress Notes (Signed)
Patient ID: Jessica [name], female   DOB: [birth year], 71 y.o.   MRN: [id number]    Subjective: Pt c/o abdominal pain.  Minimal flatus  Objective: Vital signs in last 24 hours: Temp:  [97.6 F (36.4 C)-99.2 F (37.3 C)] 97.6 F (36.4 C) (11/29 0600) Pulse Rate:  [83-105] 83  (11/29 0600) Resp:  [15-20] 20  (11/29 0600) BP: (100-160)/(47-78) 124/54 mmHg (11/29 0600) SpO2:  [95 %-100 %] 100 % (11/29 0600) Weight:  [125 lb 7.1 oz (56.9 kg)] 125 lb 7.1 oz (56.9 kg) (11/28 1038) Last BM Date: 02/06/12  Intake/Output from previous day: 11/28 0701 - 11/29 0700 In: 2709.3 [I.V.:2709.3] Out: 2050 [Urine:350; Emesis/NG output:1700] Intake/Output this shift:    PE: Abd: soft, minimal if any distention, NGT with thick bilious output, +BS, diffusely tender Heart: regular Lungs: CTAB  Lab Results:   Basename 02/08/12 1219 02/08/12 0527 02/08/12 0508  WBC 7.9 -- 7.2  HGB 13.7 15.0 --  HCT 39.3 44.0 --  PLT 321 -- 318   BMET  Basename 02/08/12 1219 02/08/12 0527 02/08/12 0508  NA -- 139 140  K -- 3.6 3.7  CL -- 107 104  CO2 -- -- 22  GLUCOSE -- 141* 145*  BUN -- 19 18  CREATININE 0.50 0.60 --  CALCIUM -- -- 10.1   PT/INR  Basename 02/08/12 0811  LABPROT 12.8  INR 0.97   CMP     Component Value Date/Time   NA 139 02/08/2012 0527   K 3.6 02/08/2012 0527   CL 107 02/08/2012 0527   CO2 22 02/08/2012 0508   GLUCOSE 141* 02/08/2012 0527   BUN 19 02/08/2012 0527   CREATININE 0.50 02/08/2012 1219   CREATININE 0.62 10/20/2011 1006   CALCIUM 10.1 02/08/2012 0508   PROT 8.2 02/08/2012 0508   ALBUMIN 3.9 02/08/2012 0508   AST 18 02/08/2012 0508   ALT 10 02/08/2012 0508   ALKPHOS 117 02/08/2012 0508   BILITOT 0.3 02/08/2012 0508   GFRNONAA >90 02/08/2012 1219   GFRAA >90 02/08/2012 1219   Lipase     Component Value Date/Time   LIPASE 36 02/08/2012 0508       Studies/Results: Dg Abd 1 View  02/09/2012  *RADIOLOGY REPORT*  Clinical Data: Follow-up small  bowel obstruction  ABDOMEN - 1 VIEW  Comparison: The 02/08/2012 CT.  Findings: Mild gaseous distention of the mid abdominal small bowel loops.  Prior CT, many of these are fluid filled and likely it difficult to visualize by plain film.  The large stool burden throughout the colon.  No free air.  IMPRESSION: Continued mild prominence of mid abdominal small bowel loops.  No real change from scout film of recent CT.   Original Report Authenticated By: Charlett Nose, M.D.    Ct Abdomen Pelvis W Contrast  02/08/2012  *RADIOLOGY REPORT*  Clinical Data: Lower abdominal pain and vomiting.  CT ABDOMEN AND PELVIS WITH CONTRAST  Technique:  Multidetector CT imaging of the abdomen and pelvis was performed following the standard protocol during bolus administration of intravenous contrast.  Contrast: 80mL OMNIPAQUE IOHEXOL 300 MG/ML  SOLN  Comparison: CT scan 12/17/2010.  Findings: The lung bases are clear except for dependent atelectasis.  The liver demonstrates stable intra hepatic biliary dilatation likely due to prior cholecystectomy.  The common bile duct is also mildly dilated.  No focal hepatic lesions.  The spleen is normal in size.  No focal lesions.  The pancreas is unremarkable.  The adrenal glands and kidneys are  normal.  The stomach is distended with contrast.  No gross abnormalities. The duodenum is normal.  The proximal and mid small bowel is dilated.  There is a transition in the mid pelvis to normal/decompressed small bowel loops.  Findings consistent with a mid distal small bowel obstruction due to adhesions.  The colon is unremarkable except for scattered diverticulosis.  There are surgical changes involving the mid small bowel in the left lower quadrant.  The aorta demonstrates moderate atherosclerotic changes.  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  No complicating features.  No mesenteric or retroperitoneal mass or adenopathy.  The bladder is unremarkable.  The uterus is surgically absent.  No pelvic mass,  adenopathy or significant free pelvic fluid collection.  No inguinal mass or hernia.  The bony structures are intact.  IMPRESSION: Mid distal small bowel obstruction with transition in the mid pelvis likely due to adhesions.   Original Report Authenticated By: Rudie Meyer, M.D.     Anti-infectives: Anti-infectives    None       Assessment/Plan  1. PSBO 2. HIV  Plan: 1. Abdominal films show air and lots of stool in the colon, but some still mildly dilated loops of small bowel.  She put out 1700cc from her NGT yesterday.  Would continue NGT for today and repeat films in the morning and see how things are progressing. 2. Follow closely given abdominal pain, but vitals are stable as well as labs.  No need for emergent exploration.  LOS: 1 day    Adlai Nieblas E 02/09/2012, 8:19 AM Pager: [phone]

## 2012-02-09 NOTE — Progress Notes (Signed)
Subjective: Pt still with abdominal pain, no worse than on admission. +flatus x1 early this morning.  Objective: Vital signs in last 24 hours: Filed Vitals:   02/08/12 1357 02/08/12 1820 02/08/12 2205 02/09/12 0600  BP: 149/63 128/58 120/47 124/54  Pulse: 91 87 84 83  Temp: 99.2 F (37.3 C) 98.9 F (37.2 C) 98.8 F (37.1 C) 97.6 F (36.4 C)  TempSrc: Oral Oral Oral Oral  Resp: 20 18 20 20   Height:      Weight:      SpO2: 96% 95% 100% 100%   Weight change:   Intake/Output Summary (Last 24 hours) at 02/09/12 0823 Last data filed at 02/09/12 0600  Gross per 24 hour  Intake 2709.33 ml  Output   2050 ml  Net 659.33 ml   Vitals reviewed. General:Lying in bed, NAD HEENT: PERRL, EOMI Cardiac: RRR, no rubs, murmurs or gallops Pulm: Clear to auscultation bilaterally, no wheezes, rales, or rhonchi Abd: Firmer to the touch than yesterday, diffusely TTP, BS present with tinkling BS in LUQ Ext: Warm and well perfused Neuro: Alert and oriented X3, non focal  Lab Results: Basic Metabolic Panel:  Lab 02/08/12 6962 02/08/12 0527 02/08/12 0508 02/03/12 0839  NA -- 139 140 --  K -- 3.6 3.7 --  CL -- 107 104 --  CO2 -- -- 22 24  GLUCOSE -- 141* 145* --  BUN -- 19 18 --  CREATININE 0.50 0.60 -- --  CALCIUM -- -- 10.1 9.5  MG -- -- -- --  PHOS -- -- -- --   Liver Function Tests:  Lab 02/08/12 0508  AST 18  ALT 10  ALKPHOS 117  BILITOT 0.3  PROT 8.2  ALBUMIN 3.9    Lab 02/08/12 0508  LIPASE 36  AMYLASE --   CBC:  Lab 02/08/12 1219 02/08/12 0527 02/08/12 0508 02/03/12 0839  WBC 7.9 -- 7.2 --  NEUTROABS -- -- -- 3.6  HGB 13.7 15.0 -- --  HCT 39.3 44.0 -- --  MCV 96.8 -- 94.9 --  PLT 321 -- 318 --   Cardiac Enzymes:  Lab 02/08/12 1259 02/08/12 0812 02/03/12 0839  CKTOTAL -- 59 --  CKMB -- 6.4* --  CKMBINDEX -- -- --  TROPONINI <0.30 -- <0.30   D-Dimer:  Lab 02/03/12 0839  DDIMER 3.07*   Coagulation:  Lab 02/08/12 0811  LABPROT 12.8  INR 0.97    Urinalysis:  Lab 02/08/12 1316  COLORURINE YELLOW  LABSPEC 1.015  PHURINE 7.0  GLUCOSEU NEGATIVE  HGBUR TRACE*  BILIRUBINUR NEGATIVE  KETONESUR 15*  PROTEINUR NEGATIVE  UROBILINOGEN 0.2  NITRITE NEGATIVE  LEUKOCYTESUR NEGATIVE    Studies/Results: Dg Abd 1 View  02/09/2012  *RADIOLOGY REPORT*  Clinical Data: Follow-up small bowel obstruction  ABDOMEN - 1 VIEW  Comparison: The 02/08/2012 CT.  Findings: Mild gaseous distention of the mid abdominal small bowel loops.  Prior CT, many of these are fluid filled and likely it difficult to visualize by plain film.  The large stool burden throughout the colon.  No free air.  IMPRESSION: Continued mild prominence of mid abdominal small bowel loops.  No real change from scout film of recent CT.   Original Report Authenticated By: Charlett Nose, M.D.    Ct Abdomen Pelvis W Contrast  02/08/2012  *RADIOLOGY REPORT*  Clinical Data: Lower abdominal pain and vomiting.  CT ABDOMEN AND PELVIS WITH CONTRAST  Technique:  Multidetector CT imaging of the abdomen and pelvis was performed following the standard protocol during bolus  administration of intravenous contrast.  Contrast: 80mL OMNIPAQUE IOHEXOL 300 MG/ML  SOLN  Comparison: CT scan 12/17/2010.  Findings: The lung bases are clear except for dependent atelectasis.  The liver demonstrates stable intra hepatic biliary dilatation likely due to prior cholecystectomy.  The common bile duct is also mildly dilated.  No focal hepatic lesions.  The spleen is normal in size.  No focal lesions.  The pancreas is unremarkable.  The adrenal glands and kidneys are normal.  The stomach is distended with contrast.  No gross abnormalities. The duodenum is normal.  The proximal and mid small bowel is dilated.  There is a transition in the mid pelvis to normal/decompressed small bowel loops.  Findings consistent with a mid distal small bowel obstruction due to adhesions.  The colon is unremarkable except for scattered  diverticulosis.  There are surgical changes involving the mid small bowel in the left lower quadrant.  The aorta demonstrates moderate atherosclerotic changes.  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  No complicating features.  No mesenteric or retroperitoneal mass or adenopathy.  The bladder is unremarkable.  The uterus is surgically absent.  No pelvic mass, adenopathy or significant free pelvic fluid collection.  No inguinal mass or hernia.  The bony structures are intact.  IMPRESSION: Mid distal small bowel obstruction with transition in the mid pelvis likely due to adhesions.   Original Report Authenticated By: Rudie Meyer, M.D.    Medications: I have reviewed the patient's current medications. Scheduled Meds:   . antiseptic oral rinse  15 mL Mouth Rinse q12n4p  . chlorhexidine  15 mL Mouth Rinse BID  . enoxaparin (LOVENOX) injection  40 mg Subcutaneous Q24H  . [COMPLETED] HYDROmorphone      . [COMPLETED] lidocaine   Topical Once  . [COMPLETED] LORazepam  1 mg Intravenous Once  . nicotine  21 mg Transdermal Once  . ondansetron  4 mg Intravenous Q6H  . promethazine  12.5 mg Intravenous Once  . [DISCONTINUED] ondansetron  4 mg Intravenous Q6H   Continuous Infusions:   . sodium chloride 125 mL/hr at 02/09/12 0226   PRN Meds:.HYDROmorphone (DILAUDID) injection, [DISCONTINUED]  HYDROmorphone (DILAUDID) injection, [DISCONTINUED] ondansetron (ZOFRAN) IV, [DISCONTINUED] ondansetron, [DISCONTINUED] promethazine  Assessment/Plan:  1. Small bowel obstruction, symptomatic: Pt with h/o of multiple abdominal surgeries and is s/p small bowel resection 10/12. She presents today with symptoms of a SBO, similar to her previous SBO but less severe. CT scan shows mild distal small bowel obstruction with transition in the mid pelvis likely due to adhesions. Of note, last colonoscopy 5/12 with extensive left sided diverticula. Pt has been made NPO, and NGT was placed in the ED with minimal light green, clear output,  that has significantly increased and is now bilious. Will continue to keep the pt NPO with NG decompression and pain management. Surgery is following and does not feel there is an emergent need for surgery.  - Continue NPO  - Continue NS@125   - Continue Dilaudid 0.5mg  q3h PRN  - F/u CBC and BMP  2. HIV, well controlled: Pt is a pt of Dr. Bonnetta Barry. She last saw Dr. Maurice March in October, and endorses compliance with her medications; she is on Atripla. Her last CD4 count was 1210 with a viral load <40 on 06/15/11 . While she is NPO with an NG tube, will hold her medications for now and resume once able to tolerate oral medications.  - Checking CD4 and viral load   3. HTN: Pt on Benazepril at home  for her HTN. BP on presentation to the ED was very elevated to 193/91. It has improved to 124/54 this morning. Will continue to monitor.   4. Acute on chronic voiding dysfucntion: S/p meatal dilation and cystoscopy in '05 per Dr. Brunilda Payor with Urology. She still c/o of hesitancy while urinating but denies dysuria. Pt seen in the ED 11/26 and treated for E. coli positive UTI. She denies any further urinary symptoms, but does c/o left flank pain. UA negative. Will continue to monitor  5. DVT PPX: [place] Lovenox   6. Dispo: Disposition is deferred at this time, awaiting clinical improvement.   Anticipated discharge in approximately 3 day(s).   The patient does have a current PCP [name], MD), therefore will be requiring OPC follow-up after discharge.   The patient does not have transportation limitations that hinder transportation to clinic appointments.   .Services Needed at time of discharge: Y = Yes, Blank = No PT:   OT:   RN:   Equipment:   Other:     LOS: 1 day   Genelle Gather 02/09/2012, 8:23 AM

## 2012-02-09 NOTE — Progress Notes (Signed)
Some flatus last night Still having abd pain Hi ng output - thick green  abd soft, nd, mild diffuse TTP. No peritonitis  psbo  Vitals stable - no fever, tachycardia Cont medical mgmt for now  Mary Sella. Andrey Campanile, MD, FACS General, Bariatric, & Minimally Invasive Surgery [facility], [place]

## 2012-02-10 LAB — BASIC METABOLIC PANEL
BUN: 15 mg/dL (ref 6–23)
CO2: 20 mEq/L (ref 19–32)
Chloride: 104 mEq/L (ref 96–112)
GFR calc non Af Amer: >90 mL/min (ref >90)
Glucose, Bld: 74 mg/dL (ref 70–99)
Potassium: 3.8 mEq/L (ref 3.5–5.1)
Sodium: 137 mEq/L (ref 135–145)

## 2012-02-10 NOTE — Progress Notes (Signed)
  Subjective: Pain a little better. Some flatus this am. NG output down 725cc/24hrs - still dark green  Objective: Vital signs in last 24 hours: Temp:  [97.6 F (36.4 C)-98.8 F (37.1 C)] 98.6 F (37 C) (11/30 0530) Pulse Rate:  [73-85] 85  (11/30 0530) Resp:  [17-18] 18  (11/30 0530) BP: (146-158)/(52-66) 158/62 mmHg (11/30 0530) SpO2:  [91 %-94 %] 94 % (11/30 0530) Last BM Date: 02/06/12  Intake/Output from previous day: 11/29 0701 - 11/30 0700 In: 2975 [I.V.:2875; IV Piggyback:100] Out: 1225 [Urine:500; Emesis/NG output:725] Intake/Output this shift:    Alert, nad, talking on phone Soft, nd. Mild diffuse TTP, more RLQ. (less tender today on exam) no peritonitis  Lab Results:   Basename 02/09/12 0840 02/08/12 1219  WBC 5.9 7.9  HGB 12.1 13.7  HCT 35.9* 39.3  PLT 296 321   BMET  Basename 02/09/12 0840 02/08/12 1219 02/08/12 0527 02/08/12 0508  NA 139 -- 139 --  K 3.2* -- 3.6 --  CL 107 -- 107 --  CO2 23 -- -- 22  GLUCOSE 90 -- 141* --  BUN 17 -- 19 --  CREATININE 0.54 0.50 -- --  CALCIUM 9.0 -- -- 10.1   PT/INR  Basename 02/08/12 0811  LABPROT 12.8  INR 0.97   ABG No results found for this basename: PHART:2,PCO2:2,PO2:2,HCO3:2 in the last 72 hours  Studies/Results: Dg Abd 1 View  02/09/2012  *RADIOLOGY REPORT*  Clinical Data: Follow-up small bowel obstruction  ABDOMEN - 1 VIEW  Comparison: The 02/08/2012 CT.  Findings: Mild gaseous distention of the mid abdominal small bowel loops.  Prior CT, many of these are fluid filled and likely it difficult to visualize by plain film.  The large stool burden throughout the colon.  No free air.  IMPRESSION: Continued mild prominence of mid abdominal small bowel loops.  No real change from scout film of recent CT.   Original Report Authenticated By: Charlett Nose, M.D.     Anti-infectives: Anti-infectives    None      Assessment/Plan: pSBO - afebrile. No tachycardia. Reports less pain. Some flatus. Ng tube  output decreasing. Therefore, will continue non-operative mgmt of pSBO. Cont npo, ng tube to liws. Repeat abd xrays in am. Daily bmet  Chronic pain  Mary Sella. Andrey Campanile, MD, FACS General, Bariatric, & Minimally Invasive Surgery [facility], [place]   LOS: 2 days    Atilano Ina 02/10/2012

## 2012-02-10 NOTE — Progress Notes (Signed)
Subjective: No acute issues overnight - in fact reports having had a good night because of excellent night RN.  She was given pain medication almost every 2 hours.  She also urinated yesterday.  +flatus last night. Persistent abdominal pain, but improved.  Objective: Vital signs in last 24 hours: Filed Vitals:   02/09/12 0600 02/09/12 1436 02/09/12 2149 02/10/12 0530  BP: 124/54 146/66 155/52 158/62  Pulse: 83 83 73 85  Temp: 97.6 F (36.4 C) 97.6 F (36.4 C) 98.8 F (37.1 C) 98.6 F (37 C)  TempSrc: Oral Oral Oral Oral  Resp: 20 17 18 18   Height:      Weight:      SpO2: 100% 91% 94% 94%   Weight change:   Intake/Output Summary (Last 24 hours) at 02/10/12 0721 Last data filed at 02/10/12 0500  Gross per 24 hour  Intake   2975 ml  Output   1025 ml  Net   1950 ml  NG output: 725 recorded in last 24h, 650cc at bedside - thick bilious output  General: resting in bed HEENT: PERRL, EOMI, no scleral icterus, NGT in place Cardiac: RRR, no rubs, murmurs or gallops Pulm: clear to auscultation bilaterally, moving normal volumes of air Abd: soft, mildly tender in all 4 quadrants, nondistended, BS normoactive Ext: warm and well perfused, no pedal edema Neuro: alert and oriented X3  Lab Results: Basic Metabolic Panel:  Lab 02/09/12 4782 02/08/12 1219 02/08/12 0527 02/08/12 0508  NA 139 -- 139 --  K 3.2* -- 3.6 --  CL 107 -- 107 --  CO2 23 -- -- 22  GLUCOSE 90 -- 141* --  BUN 17 -- 19 --  CREATININE 0.54 0.50 -- --  CALCIUM 9.0 -- -- 10.1  MG -- -- -- --  PHOS -- -- -- --   Liver Function Tests:  Lab 02/08/12 0508  AST 18  ALT 10  ALKPHOS 117  BILITOT 0.3  PROT 8.2  ALBUMIN 3.9    Lab 02/08/12 0508  LIPASE 36  AMYLASE --   CBC:  Lab 02/09/12 0840 02/08/12 1219 02/03/12 0839  WBC 5.9 7.9 --  NEUTROABS -- -- 3.6  HGB 12.1 13.7 --  HCT 35.9* 39.3 --  MCV 96.8 96.8 --  PLT 296 321 --   Cardiac Enzymes:  Lab 02/08/12 1259 02/08/12 0812 02/03/12 0839    CKTOTAL -- 59 --  CKMB -- 6.4* --  CKMBINDEX -- -- --  TROPONINI <0.30 -- <0.30   D-Dimer:  Lab 02/03/12 0839  DDIMER 3.07*   Coagulation:  Lab 02/08/12 0811  LABPROT 12.8  INR 0.97   Urinalysis:  Lab 02/08/12 1316  COLORURINE YELLOW  LABSPEC 1.015  PHURINE 7.0  GLUCOSEU NEGATIVE  HGBUR TRACE*  BILIRUBINUR NEGATIVE  KETONESUR 15*  PROTEINUR NEGATIVE  UROBILINOGEN 0.2  NITRITE NEGATIVE  LEUKOCYTESUR NEGATIVE    Studies/Results: Dg Abd 1 View  02/09/2012  *RADIOLOGY REPORT*  Clinical Data: Follow-up small bowel obstruction  ABDOMEN - 1 VIEW  Comparison: The 02/08/2012 CT.  Findings: Mild gaseous distention of the mid abdominal small bowel loops.  Prior CT, many of these are fluid filled and likely it difficult to visualize by plain film.  The large stool burden throughout the colon.  No free air.  IMPRESSION: Continued mild prominence of mid abdominal small bowel loops.  No real change from scout film of recent CT.   Original Report Authenticated By: Charlett Nose, M.D.    Medications: I have reviewed the patient's  current medications. Scheduled Meds:   . antiseptic oral rinse  15 mL Mouth Rinse q12n4p  . chlorhexidine  15 mL Mouth Rinse BID  . enoxaparin (LOVENOX) injection  40 mg Subcutaneous Q24H  . fentaNYL  50 mcg Transdermal Q72H  . [COMPLETED] nicotine  21 mg Transdermal Once  . nicotine  21 mg Transdermal Daily  . ondansetron  4 mg Intravenous Q6H  . [COMPLETED] potassium chloride  10 mEq Intravenous Q1 Hr x 4  . [DISCONTINUED] promethazine  12.5 mg Intravenous Once   Continuous Infusions:   . sodium chloride 125 mL/hr (02/10/12 0654)   PRN Meds:.HYDROmorphone (DILAUDID) injection, [DISCONTINUED]  HYDROmorphone (DILAUDID) injection Assessment/Plan:  # Partial small bowel obstruction, symptomatic: h/o of multiple abdominal surgeries and is s/p small bowel resection 10/12; CT scan revealed mild distal small bowel obstruction with transition in the mid  pelvis likely due to adhesions. Of note, last colonoscopy 5/12 with extensive left sided diverticula. - Appreciate surgery recs, currently med mgmt with watchful waiting - output decreasing - Continue NPO with NGT to intermittent suction - Continue NS@125  - Continue pain mgmt with Dilaudid 2mg  q2h PRN & fentanyl patch, will consider de-escalating pain mgmt tomorrow - Continue nausea mgmt with zofran q6h prn - No BMP this morning - will order now and follow (hypokalemia on 02/09/12, s/p 4 runs of potassium)  # HIV, well controlled: CD4 = 680 two days prior, VL in process; followed by Dr. Maurice March outpatient- on Atripla, but being held at present. \ - Follow viral load   # HTN: Mildly elevated on IVF - continue to monitor, holding home benazepril at present   # Acute on chronic voiding dysfucntion: Able to void yesterday after bladder scan revealed urine in bladder; S/p meatal dilation and cystoscopy in '05 per Dr. Brunilda Payor with Urology.  # Tobacco abuse: treatment with nicotine patch during hospitalization  # DVT PPX: [place] Lovenox   # Dispo: Disposition is deferred at this time, awaiting clinical improvement.   Dispo: Disposition is deferred at this time, awaiting improvement of current medical problems.  Anticipated discharge in approximately 3-4 day(s).   The patient does not have a current PCP [name], MD), therefore will be requiring OPC follow-up after discharge.   The patient does not have transportation limitations that hinder transportation to clinic appointments.  .Services Needed at time of discharge: Y = Yes, Blank = No PT:   OT:   RN:   Equipment:   Other:     LOS: 2 days   Stacy Gardner MD 02/10/2012, 7:21 AM

## 2012-02-11 ENCOUNTER — Inpatient Hospital Stay (HOSPITAL_COMMUNITY): Payer: Medicare Other

## 2012-02-11 DIAGNOSIS — E876 Hypokalemia: Secondary | ICD-10-CM

## 2012-02-11 LAB — BASIC METABOLIC PANEL
BUN: 10 mg/dL (ref 6–23)
CO2: 18 mEq/L — ABNORMAL LOW (ref 19–32)
Chloride: 104 mEq/L (ref 96–112)
Creatinine, Ser: 0.51 mg/dL (ref 0.50–1.10)
GFR calc Af Amer: >90 mL/min (ref >90)
Glucose, Bld: 64 mg/dL — ABNORMAL LOW (ref 70–99)
Potassium: 3.4 mEq/L — ABNORMAL LOW (ref 3.5–5.1)

## 2012-02-11 MED ORDER — POTASSIUM CHLORIDE 10 MEQ/100ML IV SOLN
10.0000 meq | INTRAVENOUS | Status: AC
Start: 2012-02-11 — End: 2012-02-11
  Administered 2012-02-11 (×3): 10 meq via INTRAVENOUS
  Filled 2012-02-11 (×3): qty 100

## 2012-02-11 MED ORDER — HYDROMORPHONE HCL PF 1 MG/ML IJ SOLN
2.0000 mg | Freq: Once | INTRAMUSCULAR | Status: AC
  Administered 2012-02-11: 2 mg via INTRAMUSCULAR

## 2012-02-11 MED ORDER — HYDRALAZINE HCL 20 MG/ML IJ SOLN
10.0000 mg | INTRAMUSCULAR | Status: DC | PRN
  Administered 2012-02-11 – 2012-02-18 (×4): 10 mg via INTRAVENOUS
  Filled 2012-02-11 (×5): qty 0.5

## 2012-02-11 MED ORDER — DIPHENHYDRAMINE HCL 50 MG/ML IJ SOLN
50.0000 mg | Freq: Every evening | INTRAMUSCULAR | Status: DC | PRN
  Administered 2012-02-12 (×2): 25 mg via INTRAVENOUS
  Filled 2012-02-11: qty 1

## 2012-02-11 NOTE — Progress Notes (Signed)
Rn advanced tube after last xray another 2 inches per radiology's recommendation.

## 2012-02-11 NOTE — Progress Notes (Signed)
Subjective: Did not sleep well overnight, not sure why, has had this problem prior to admission. No N/V overnight.  Objective: Vital signs in last 24 hours: Filed Vitals:   02/10/12 0820 02/10/12 1420 02/10/12 2228 03/04/12 0634  BP:  161/69 156/50 157/73  Pulse:  83 94 74  Temp:  99.2 F (37.3 C) 98.7 F (37.1 C) 98.6 F (37 C)  TempSrc:  Oral Oral Oral  Resp:  18 18 18   Height: 5\' 7"  (1.702 m)     Weight: 125 lb 7.1 oz (56.9 kg)     SpO2:  94% 91% 95%   Weight change:   Intake/Output Summary (Last 24 hours) at 2012/03/04 1011 Last data filed at 2012-03-04 0700  Gross per 24 hour  Intake   2125 ml  Output   2850 ml  Net   -725 ml   General: resting in bed HEENT: PERRL, EOMI, no scleral icterus Cardiac: RRR, no rubs, murmurs or gallops Pulm: clear to auscultation bilaterally, moving normal volumes of air Abd: soft, mildly diffusely tender, nondistended, BS normoactive Ext: warm and well perfused, no pedal edema Neuro: alert and oriented X3, cranial nerves II-XII grossly intact  Lab Results: Basic Metabolic Panel:  Lab 2012-03-04 4098 02/10/12 0832  NA 138 137  K 3.4* 3.8  CL 104 104  CO2 18* 20  GLUCOSE 64* 74  BUN 10 15  CREATININE 0.51 0.54  CALCIUM 9.0 8.8  MG -- --  PHOS -- --   Liver Function Tests:  Lab 02/08/12 0508  AST 18  ALT 10  ALKPHOS 117  BILITOT 0.3  PROT 8.2  ALBUMIN 3.9    Lab 02/08/12 0508  LIPASE 36  AMYLASE --   CBC:  Lab 02/09/12 0840 02/08/12 1219  WBC 5.9 7.9  NEUTROABS -- --  HGB 12.1 13.7  HCT 35.9* 39.3  MCV 96.8 96.8  PLT 296 321   Cardiac Enzymes:  Lab 02/08/12 1259 02/08/12 0812  CKTOTAL -- 59  CKMB -- 6.4*  CKMBINDEX -- --  TROPONINI <0.30 --   Coagulation:  Lab 02/08/12 0811  LABPROT 12.8  INR 0.97   Urinalysis:  Lab 02/08/12 1316  COLORURINE YELLOW  LABSPEC 1.015  PHURINE 7.0  GLUCOSEU NEGATIVE  HGBUR TRACE*  BILIRUBINUR NEGATIVE  KETONESUR 15*  PROTEINUR NEGATIVE  UROBILINOGEN 0.2  NITRITE  NEGATIVE  LEUKOCYTESUR NEGATIVE    Studies/Results: Dg Abd 2 Views  2012/03/04  *RADIOLOGY REPORT*  Clinical Data: Abdominal pain, small bowel obstruction  ABDOMEN - 2 VIEW  Comparison: Prior radiograph 02/09/2012  Findings: Progressive gaseous dilatation of small bowel in the mid abdomen. A linear opacity is incompletely imaged projecting over the mid thoracic spine.  If this represents a nasogastric tube, it is in the mid thoracic esophagus.  Maximal small bowel diameter measures up to 5.5 cm.  No free air on the upright view although there are multiple air-fluid levels.  Surgical clips noted the right upper quadrant and left lower abdomen.  Patchy opacities noted in the right lung base.  IMPRESSION:  1.  Interval progression of small bowel obstruction.  Maximal small bowel diameter measures 4.9 cm on the upright view, and 5.5 cm on the supine view.  2. Partially imaged linear opacity projects over the mid thoracic spine.  If this represents the marker of a nasogastric tube, the tip of the tube is in the mid thoracic esophagus.  3.  Patchy opacities in the right lung base may reflect atelectasis, consolidation or potentially aspiration  in the appropriate clinical setting.  If clinically warranted, consider dedicated chest x-ray to further evaluate.   Original Report Authenticated By: Malachy Moan, M.D.    Medications: I have reviewed the patient's current medications. Scheduled Meds:   . antiseptic oral rinse  15 mL Mouth Rinse q12n4p  . chlorhexidine  15 mL Mouth Rinse BID  . enoxaparin (LOVENOX) injection  40 mg Subcutaneous Q24H  . fentaNYL  50 mcg Transdermal Q72H  . [COMPLETED]  HYDROmorphone (DILAUDID) injection  2 mg Intramuscular Once  . nicotine  21 mg Transdermal Daily  . ondansetron  4 mg Intravenous Q6H  . potassium chloride  10 mEq Intravenous Q1 Hr x 3   Continuous Infusions:   . sodium chloride 125 mL/hr at 02/11/12 0855   PRN Meds:.HYDROmorphone (DILAUDID)  injection Assessment/Plan: 71 yo woman with h/o HIV, HTN and previous abdominal surgeries was admitted on 02/08/12 for  # Partial small bowel obstruction, symptomatic: h/o of multiple abdominal surgeries and is s/p small bowel resection 10/12; CT scan revealed mild distal small bowel obstruction with transition in the mid pelvis likely due to adhesions. Of note, last colonoscopy 5/12 with extensive left sided diverticula.  - Appreciate surgery recs, currently med mgmt with watchful waiting - continues to have significant NGT output - 1225cc/24h, and AXR today revealed persistent dilated loops of SB with air in colon, and NGT was not in the stomach - surgery anticipating laparotomy in the next day or so if no improvement - follow AM XR - Continue NPO with NGT to intermittent suction  - Continue NS@125 , she will likely need to be transitioned to D5 1/2 NS soon since she is continuing to require NPO - Continue pain mgmt with Dilaudid 2mg  q2h PRN & fentanyl patch - consistently receiving dilaudid - Continue nausea mgmt with zofran q6h prn - requiring med QID  #Hypokalemia: likely related to NG output; potassium repleted this AM - will monitor with AM bmet  # HIV, well controlled: CD4 = 680 & undetectable VL two days prior, VL in process; followed by Dr. Maurice March outpatient- on Atripla, but being held at present.   # HTN: Mildly elevated on IVF - continue to monitor, holding home benazepril at present   # Acute on chronic voiding dysfucntion: reports improvement in voiding - back to baseline; S/p meatal dilation and cystoscopy in '05 per Dr. Brunilda Payor with Urology.   # Tobacco abuse: treatment with nicotine patch during hospitalization   # DVT PPX: [place] Lovenox   # Dispo: Disposition is deferred at this time, awaiting clinical improvement.  .  The patient does not have a current PCP [name], MD), therefore will be requiring OPC follow-up after discharge.  The patient does not have transportation  limitations that hinder transportation to clinic appointments.   .Services Needed at time of discharge: Y = Yes, Blank = No PT:   OT:   RN:   Equipment:   Other:     LOS: 3 days   KAPADIA, Corretta Munce 02/11/2012, 10:11 AM

## 2012-02-11 NOTE — Progress Notes (Signed)
  Subjective: No flatus yesterday. "feel a little better", +nausea; pIV just reinserted  Objective: Vital signs in last 24 hours: Temp:  [98.6 F (37 C)-99.2 F (37.3 C)] 98.6 F (37 C) (12/01 0634) Pulse Rate:  [74-94] 74  (12/01 0634) Resp:  [18] 18  (12/01 0634) BP: (156-161)/(50-73) 157/73 mmHg (12/01 0634) SpO2:  [91 %-95 %] 95 % (12/01 0634) Last BM Date: 02/06/12  Intake/Output from previous day: 11/30 0701 - 12/01 0700 In: 2125 [I.V.:2125] Out: 2850 [Urine:1625; Emesis/NG output:1225] Intake/Output this shift:    Alert, nad Soft, some distension in upper abd, no BS, less tender today - no RT, no guarding  Lab Results:   [facility] 02/09/12 0840 02/08/12 1219  WBC 5.9 7.9  HGB 12.1 13.7  HCT 35.9* 39.3  PLT 296 321   BMET  Basename 02/11/12 0625 02/10/12 0832  NA 138 137  K 3.4* 3.8  CL 104 104  CO2 18* 20  GLUCOSE 64* 74  BUN 10 15  CREATININE 0.51 0.54  CALCIUM 9.0 8.8   PT/INR No results found for this basename: LABPROT:2,INR:2 in the last 72 hours ABG No results found for this basename: PHART:2,PCO2:2,PO2:2,HCO3:2 in the last 72 hours  Studies/Results: No results found.  Anti-infectives: Anti-infectives    None      Assessment/Plan: Hypokalemia - replace with KCL pSBO abd xrays reviewed - still with dilated loops of SB. Air in colon. NG not in stomach.  Ordered nurse to adv NG  Will cont medical mgmt for now, no tachy or febrile. abd pain less; however, pSBO persists. If doesn't resolve in next day or so will probably require laparotomy  Mary Sella. Andrey Campanile, MD, FACS General, Bariatric, & Minimally Invasive Surgery [facility], [place]    LOS: 3 days    Atilano Ina 02/11/2012

## 2012-02-12 ENCOUNTER — Encounter (HOSPITAL_COMMUNITY): Payer: Self-pay | Admitting: Anesthesiology

## 2012-02-12 ENCOUNTER — Inpatient Hospital Stay (HOSPITAL_COMMUNITY): Payer: Medicare Other

## 2012-02-12 ENCOUNTER — Inpatient Hospital Stay (HOSPITAL_COMMUNITY): Payer: Medicare Other | Admitting: Anesthesiology

## 2012-02-12 ENCOUNTER — Encounter (HOSPITAL_COMMUNITY)
Admission: EM | Disposition: A | Discharge status: Home / Self Care | Payer: Self-pay | Source: Home / Self Care | Attending: Internal Medicine

## 2012-02-12 HISTORY — PX: LAPAROTOMY: SHX154

## 2012-02-12 LAB — BASIC METABOLIC PANEL
CO2: 17 mEq/L — ABNORMAL LOW (ref 19–32)
Chloride: 102 mEq/L (ref 96–112)
Creatinine, Ser: 0.46 mg/dL — ABNORMAL LOW (ref 0.50–1.10)
Glucose, Bld: 58 mg/dL — ABNORMAL LOW (ref 70–99)

## 2012-02-12 LAB — CBC
Hemoglobin: 13.4 g/dL (ref 12.0–15.0)
MCH: 33.4 pg (ref 26.0–34.0)
Platelets: 268 10*3/uL (ref 150–400)
RBC: 4.01 MIL/uL (ref 3.87–5.11)
WBC: 5.1 10*3/uL (ref 4.0–10.5)

## 2012-02-12 LAB — GLUCOSE, CAPILLARY
Glucose-Capillary: 106 mg/dL — ABNORMAL HIGH (ref 70–99)
Glucose-Capillary: 158 mg/dL — ABNORMAL HIGH (ref 70–99)
Glucose-Capillary: 269 mg/dL — ABNORMAL HIGH (ref 70–99)
Glucose-Capillary: 81 mg/dL (ref 70–99)

## 2012-02-12 LAB — CREATININE, SERUM
Creatinine, Ser: 0.42 mg/dL — ABNORMAL LOW (ref 0.50–1.10)
GFR calc Af Amer: >90 mL/min (ref >90)

## 2012-02-12 LAB — MAGNESIUM: Magnesium: 1.6 mg/dL (ref 1.5–2.5)

## 2012-02-12 SURGERY — LAPAROTOMY, EXPLORATORY
Anesthesia: General | Site: Abdomen

## 2012-02-12 MED ORDER — HYDROMORPHONE HCL PF 1 MG/ML IJ SOLN
0.2500 mg | INTRAMUSCULAR | Status: DC | PRN
  Administered 2012-02-12: 1 mg via INTRAVENOUS

## 2012-02-12 MED ORDER — PHENYLEPHRINE HCL 10 MG/ML IJ SOLN
INTRAMUSCULAR | Status: DC | PRN
  Administered 2012-02-12 (×2): 40 ug via INTRAVENOUS

## 2012-02-12 MED ORDER — FAT EMULSION 20 % IV EMUL
240.0000 mL | INTRAVENOUS | Status: AC
  Administered 2012-02-12: 240 mL via INTRAVENOUS
  Filled 2012-02-12: qty 250

## 2012-02-12 MED ORDER — NEOSTIGMINE METHYLSULFATE 1 MG/ML IJ SOLN
INTRAMUSCULAR | Status: DC | PRN
  Administered 2012-02-12: 1 mg via INTRAVENOUS
  Administered 2012-02-12: 3 mg via INTRAVENOUS

## 2012-02-12 MED ORDER — LACTATED RINGERS IV SOLN
INTRAVENOUS | Status: DC | PRN
  Administered 2012-02-12: 13:00:00 via INTRAVENOUS

## 2012-02-12 MED ORDER — ONDANSETRON HCL 4 MG/2ML IJ SOLN: 4.0000 mg | Freq: Once | INTRAMUSCULAR | Status: DC | PRN

## 2012-02-12 MED ORDER — HEPARIN SODIUM (PORCINE) 5000 UNIT/ML IJ SOLN
5000.0000 [IU] | Freq: Three times a day (TID) | INTRAMUSCULAR | Status: DC
  Administered 2012-02-13 – 2012-02-19 (×18): 5000 [IU] via SUBCUTANEOUS
  Filled 2012-02-12 (×21): qty 1

## 2012-02-12 MED ORDER — GLYCOPYRROLATE 0.2 MG/ML IJ SOLN
INTRAMUSCULAR | Status: DC | PRN
  Administered 2012-02-12: .4 mg via INTRAVENOUS

## 2012-02-12 MED ORDER — LACTATED RINGERS IV SOLN
INTRAVENOUS | Status: DC
  Administered 2012-02-12: 13:00:00 via INTRAVENOUS

## 2012-02-12 MED ORDER — HYDROMORPHONE HCL PF 1 MG/ML IJ SOLN
INTRAMUSCULAR | Status: DC | PRN
  Administered 2012-02-12 (×2): 0.5 mg via INTRAVENOUS

## 2012-02-12 MED ORDER — DEXTROSE 5 % IV SOLN
2.0000 g | Freq: Once | INTRAVENOUS | Status: AC
  Administered 2012-02-12: 2 g via INTRAVENOUS
  Filled 2012-02-12: qty 2

## 2012-02-12 MED ORDER — INSULIN ASPART 100 UNIT/ML ~~LOC~~ SOLN: 0.0000 [IU] | Freq: Three times a day (TID) | SUBCUTANEOUS | Status: DC

## 2012-02-12 MED ORDER — NALOXONE HCL 0.4 MG/ML IJ SOLN: 0.4000 mg | INTRAMUSCULAR | Status: DC | PRN

## 2012-02-12 MED ORDER — ENOXAPARIN SODIUM 40 MG/0.4ML ~~LOC~~ SOLN
40.0000 mg | SUBCUTANEOUS | Status: DC
  Filled 2012-02-12: qty 0.4

## 2012-02-12 MED ORDER — POTASSIUM CHLORIDE 10 MEQ/100ML IV SOLN
10.0000 meq | INTRAVENOUS | Status: AC
  Administered 2012-02-12 (×3): 10 meq via INTRAVENOUS
  Filled 2012-02-12: qty 400

## 2012-02-12 MED ORDER — DIPHENHYDRAMINE HCL 12.5 MG/5ML PO ELIX
12.5000 mg | ORAL_SOLUTION | Freq: Four times a day (QID) | ORAL | Status: DC | PRN
  Filled 2012-02-12: qty 5

## 2012-02-12 MED ORDER — DEXTROSE-NACL 5-0.45 % IV SOLN
INTRAVENOUS | Status: DC
  Administered 2012-02-12: 1000 mL via INTRAVENOUS

## 2012-02-12 MED ORDER — SODIUM CHLORIDE 0.9 % IJ SOLN
10.0000 mL | INTRAMUSCULAR | Status: DC | PRN
  Administered 2012-02-13 – 2012-02-18 (×8): 10 mL

## 2012-02-12 MED ORDER — 0.9 % SODIUM CHLORIDE (POUR BTL) OPTIME
TOPICAL | Status: DC | PRN
  Administered 2012-02-12: 1000 mL

## 2012-02-12 MED ORDER — LABETALOL HCL 5 MG/ML IV SOLN
INTRAVENOUS | Status: DC | PRN
  Administered 2012-02-12 (×5): 5 mg via INTRAVENOUS

## 2012-02-12 MED ORDER — TRACE MINERALS CR-CU-F-FE-I-MN-MO-SE-ZN IV SOLN
INTRAVENOUS | Status: AC
  Administered 2012-02-12: 17:00:00 via INTRAVENOUS
  Filled 2012-02-12: qty 1000

## 2012-02-12 MED ORDER — LACTATED RINGERS IV SOLN
INTRAVENOUS | Status: DC | PRN
  Administered 2012-02-12 (×2): via INTRAVENOUS

## 2012-02-12 MED ORDER — GLYCOPYRROLATE 0.2 MG/ML IJ SOLN
INTRAMUSCULAR | Status: AC
  Administered 2012-02-12: 0.2 mg
  Filled 2012-02-12: qty 1

## 2012-02-12 MED ORDER — ONDANSETRON HCL 4 MG/2ML IJ SOLN: 4.0000 mg | Freq: Four times a day (QID) | INTRAMUSCULAR | Status: DC | PRN

## 2012-02-12 MED ORDER — ONDANSETRON HCL 4 MG/2ML IJ SOLN
INTRAMUSCULAR | Status: DC | PRN
  Administered 2012-02-12: 4 mg via INTRAVENOUS

## 2012-02-12 MED ORDER — HYDROMORPHONE HCL PF 1 MG/ML IJ SOLN
INTRAMUSCULAR | Status: AC
  Filled 2012-02-12: qty 1

## 2012-02-12 MED ORDER — DIPHENHYDRAMINE HCL 50 MG/ML IJ SOLN: 12.5000 mg | Freq: Four times a day (QID) | INTRAMUSCULAR | Status: DC | PRN

## 2012-02-12 MED ORDER — PROPOFOL 10 MG/ML IV BOLUS
INTRAVENOUS | Status: DC | PRN
  Administered 2012-02-12: 200 mg via INTRAVENOUS

## 2012-02-12 MED ORDER — FENTANYL CITRATE 0.05 MG/ML IJ SOLN
INTRAMUSCULAR | Status: DC | PRN
  Administered 2012-02-12 (×2): 50 ug via INTRAVENOUS
  Administered 2012-02-12: 150 ug via INTRAVENOUS
  Administered 2012-02-12 (×4): 50 ug via INTRAVENOUS

## 2012-02-12 MED ORDER — SODIUM CHLORIDE 0.9 % IJ SOLN: 9.0000 mL | INTRAMUSCULAR | Status: DC | PRN

## 2012-02-12 MED ORDER — EPHEDRINE SULFATE 50 MG/ML IJ SOLN
INTRAMUSCULAR | Status: DC | PRN
  Administered 2012-02-12 (×3): 10 mg via INTRAVENOUS
  Administered 2012-02-12 (×2): 5 mg via INTRAVENOUS

## 2012-02-12 MED ORDER — ALBUMIN HUMAN 5 % IV SOLN
INTRAVENOUS | Status: DC | PRN
  Administered 2012-02-12: 15:00:00 via INTRAVENOUS

## 2012-02-12 MED ORDER — ALBUMIN HUMAN 5 % IV SOLN: INTRAVENOUS | Status: DC | PRN

## 2012-02-12 MED ORDER — ROCURONIUM BROMIDE 100 MG/10ML IV SOLN
INTRAVENOUS | Status: DC | PRN
  Administered 2012-02-12: 10 mg via INTRAVENOUS
  Administered 2012-02-12: 40 mg via INTRAVENOUS
  Administered 2012-02-12 (×2): 10 mg via INTRAVENOUS

## 2012-02-12 MED ORDER — HYDROMORPHONE 0.3 MG/ML IV SOLN
INTRAVENOUS | Status: DC
  Administered 2012-02-12: 3.3 mg via INTRAVENOUS
  Administered 2012-02-12: 16:00:00 via INTRAVENOUS
  Administered 2012-02-12: 2.5 mg via INTRAVENOUS
  Administered 2012-02-13: 1.2 mg via INTRAVENOUS
  Administered 2012-02-13: 1.5 mg via INTRAVENOUS
  Administered 2012-02-13: 2.1 mg via INTRAVENOUS
  Filled 2012-02-12: qty 25

## 2012-02-12 MED ORDER — ARTIFICIAL TEARS OP OINT
TOPICAL_OINTMENT | OPHTHALMIC | Status: DC | PRN
  Administered 2012-02-12: 1 via OPHTHALMIC

## 2012-02-12 SURGICAL SUPPLY — 44 items
BLADE SURG ROTATE 9660 (MISCELLANEOUS) IMPLANT
CANISTER SUCTION 2500CC (MISCELLANEOUS) ×2 IMPLANT
CHLORAPREP W/TINT 26ML (MISCELLANEOUS) ×2 IMPLANT
CLOTH BEACON ORANGE TIMEOUT ST (SAFETY) ×2 IMPLANT
COVER SURGICAL LIGHT HANDLE (MISCELLANEOUS) ×2 IMPLANT
DRAPE LAPAROSCOPIC ABDOMINAL (DRAPES) ×2 IMPLANT
DRAPE UTILITY 15X26 W/TAPE STR (DRAPE) ×4 IMPLANT
DRAPE WARM FLUID 44X44 (DRAPE) ×2 IMPLANT
DRSG COVADERM 4X10 (GAUZE/BANDAGES/DRESSINGS) ×2 IMPLANT
DRSG COVADERM 4X14 (GAUZE/BANDAGES/DRESSINGS) IMPLANT
ELECT BLADE 6.5 EXT (BLADE) IMPLANT
ELECT CAUTERY BLADE 6.4 (BLADE) ×2 IMPLANT
ELECT REM PT RETURN 9FT ADLT (ELECTROSURGICAL) ×2
ELECTRODE REM PT RTRN 9FT ADLT (ELECTROSURGICAL) ×1 IMPLANT
GLOVE BIO SURGEON STRL SZ 6 (GLOVE) ×2 IMPLANT
GLOVE BIOGEL PI IND STRL 6.5 (GLOVE) ×1 IMPLANT
GLOVE BIOGEL PI INDICATOR 6.5 (GLOVE) ×1
GOWN BRE IMP SLV AUR XL STRL (GOWN DISPOSABLE) ×2 IMPLANT
GOWN PREVENTION PLUS XXLARGE (GOWN DISPOSABLE) ×2 IMPLANT
GOWN STRL NON-REIN LRG LVL3 (GOWN DISPOSABLE) ×2 IMPLANT
KIT BASIN OR (CUSTOM PROCEDURE TRAY) ×2 IMPLANT
KIT ROOM TURNOVER OR (KITS) ×2 IMPLANT
LIGASURE IMPACT 36 18CM CVD LR (INSTRUMENTS) IMPLANT
NS IRRIG 1000ML POUR BTL (IV SOLUTION) ×2 IMPLANT
PACK GENERAL/GYN (CUSTOM PROCEDURE TRAY) ×2 IMPLANT
PAD ARMBOARD 7.5X6 YLW CONV (MISCELLANEOUS) ×4 IMPLANT
SEPRAFILM PROCEDURAL PACK 3X5 (MISCELLANEOUS) ×2 IMPLANT
SPECIMEN JAR X LARGE (MISCELLANEOUS) IMPLANT
SPONGE GAUZE 4X4 12PLY (GAUZE/BANDAGES/DRESSINGS) ×2 IMPLANT
SPONGE LAP 18X18 X RAY DECT (DISPOSABLE) IMPLANT
STAPLER VISISTAT 35W (STAPLE) ×2 IMPLANT
SUCTION POOLE TIP (SUCTIONS) ×2 IMPLANT
SUT PDS AB 1 TP1 96 (SUTURE) ×4 IMPLANT
SUT PDS II 0 TP-1 LOOPED 60 (SUTURE) ×4 IMPLANT
SUT VIC AB 2-0 SH 18 (SUTURE) ×2 IMPLANT
SUT VIC AB 3-0 SH 18 (SUTURE) ×2 IMPLANT
SUT VICRYL 4-0 PS2 18IN ABS (SUTURE) IMPLANT
SUT VICRYL AB 2 0 TIES (SUTURE) ×2 IMPLANT
SUT VICRYL AB 3 0 TIES (SUTURE) ×2 IMPLANT
TOWEL OR 17X24 6PK STRL BLUE (TOWEL DISPOSABLE) ×2 IMPLANT
TOWEL OR 17X26 10 PK STRL BLUE (TOWEL DISPOSABLE) ×2 IMPLANT
TRAY FOLEY CATH 14FRSI W/METER (CATHETERS) ×2 IMPLANT
WATER STERILE IRR 1000ML POUR (IV SOLUTION) ×2 IMPLANT
YANKAUER SUCT BULB TIP NO VENT (SUCTIONS) ×2 IMPLANT

## 2012-02-12 NOTE — Progress Notes (Signed)
No progression in bowel obstruction with NGT/NPO.  Films with minimal change and A-F levels. Ex lap today/possible SBR.

## 2012-02-12 NOTE — Progress Notes (Signed)
Internal Medicine Teaching Service Attending Note Date: 02/12/2012  Patient name: Jessica [name]  Medical record number: [id number]  Date of birth: 10-07-1940    This patient has been seen and discussed with the house staff. Please see their note for complete details. I concur with their findings with the following additions/corrections: Patient will be taken to operating room today for exploratory laparotomy for management of her high-grade small bowel obstruction. We will continue to follow along. Appreciate surgery for allowing [name] to take care of this patient.  Lars Mage 02/12/2012, 2:11 PM

## 2012-02-12 NOTE — Progress Notes (Signed)
INITIAL ADULT NUTRITION ASSESSMENT Date: 02/12/2012   Time: 12:10 PM  Reason for Assessment: New TPN  INTERVENTION: 1. TPN per pharmacy 2. RD to continue to follow nutrition care plan  DOCUMENTATION CODES Per approved criteria  -Not Applicable   ASSESSMENT: Female 71 y.o.  Dx: SBO (small bowel obstruction)  Hx:  Past Medical History  Diagnosis Date  . Glaucoma   . Arthritis   . Hypertension   . Hyperlipidemia   . SBO (small bowel obstruction) 12/25/2010  . Peripheral vascular disease   . 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 Chronic lower back pain   . Chronic pain   . Chest wall pain    Past Surgical History  Procedure Date  . Eye surgery   . 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 Appendectomy   . Cholecystectomy   . Colectomy 01/2011    Dr. Jamey Ripa; "took out 12 inches of small intestiines and removed blockage"  . Abdominal hysterectomy    Related Meds:     . antiseptic oral rinse  15 mL Mouth Rinse q12n4p  . cefOXitin  2 g Intravenous Once  . chlorhexidine  15 mL Mouth Rinse BID  . enoxaparin (LOVENOX) injection  40 mg Subcutaneous Q24H  . fentaNYL  50 mcg Transdermal Q72H  . insulin aspart  0-9 Units Subcutaneous TID WC  . nicotine  21 mg Transdermal Daily  . ondansetron  4 mg Intravenous Q6H  . [COMPLETED] potassium chloride  10 mEq Intravenous Q1 Hr x 3  . potassium chloride  10 mEq Intravenous Q1 Hr x 4  . [DISCONTINUED] enoxaparin (LOVENOX) injection  40 mg Subcutaneous Q24H   Ht: 5\' 7"  (170.2 cm)  Wt: 125 lb 7.1 oz (56.9 kg)  Ideal Wt: 135 lb/61.4 kg % Ideal Wt: 93%  Wt Readings from Last 15 Encounters:  02/10/12 125 lb 7.1 oz (56.9 kg)  02/10/12 125 lb 7.1 oz (56.9 kg)  01/06/12 130 lb (58.968 kg)  12/13/11 127 lb (57.607 kg)  11/01/11 128 lb (58.06 kg)  10/26/11 124 lb 6.4 oz (56.427 kg)  10/25/11 122 lb (55.339 kg)  09/21/11 124 lb (56.246 kg)  09/12/11 125 lb 12.8 oz (57.063 kg)  09/04/11 124 lb  (56.246 kg)  07/28/11 130 lb (58.968 kg)  07/23/11 127 lb (57.607 kg)  07/20/11 126 lb (57.153 kg)  06/15/11 133 lb 4 oz (60.442 kg)  03/16/11 130 lb 8 oz (59.194 kg)  Usual Wt: 125 - 130 lb % Usual Wt: 100%  Body mass index is 19.65 kg/(m^2). Weight is WNL.  Labs:  CMP     Component Value Date/Time   NA 138 02/12/2012 0723   K 3.7 02/12/2012 0723   CL 102 02/12/2012 0723   CO2 17* 02/12/2012 0723   GLUCOSE 58* 02/12/2012 0723   BUN 6 02/12/2012 0723   CREATININE 0.46* 02/12/2012 0723   CREATININE 0.62 10/20/2011 1006   CALCIUM 9.5 02/12/2012 0723   PROT 8.2 02/08/2012 0508   ALBUMIN 3.9 02/08/2012 0508   AST 18 02/08/2012 0508   ALT 10 02/08/2012 0508   ALKPHOS 117 02/08/2012 0508   BILITOT 0.3 02/08/2012 0508   GFRNONAA >90 02/12/2012 0723   GFRAA >90 02/12/2012 0723   No recent phosphorus.  Magnesium  Date/Time Value Range Status  02/12/2012  7:23 AM 1.6  1.5 - 2.5 mg/dL Final   CBG (last 3)  No results found for this basename: GLUCAP:3 in the  last 72 hours   Intake/Output Summary (Last 24 hours) at 02/12/12 1213 Last data filed at 02/12/12 0830  Gross per 24 hour  Intake   3348 ml  Output   1150 ml  Net   2198 ml  BM 11/26  Diet Order: NPO  Supplements/Tube Feeding: TPN  IVF:    dextrose 5 % and 0.45% NaCl Last Rate: 1,000 mL (02/12/12 0913)  dextrose 5 % and 0.45% NaCl   TPN (CLINIMIX) +/- additives   And   fat emulsion   [DISCONTINUED] sodium chloride Last Rate: 125 mL/hr at 02/12/12 0106   Estimated Nutritional Needs:   Kcal: 1650 - 1775 kcal Protein: 75 - 90 grams Fluid: 1.5 - 1.8 liters daily  Admitted with SBO. PMHx significant for HIV, HTN, previous abdominal surgeries.   NGT placed in ED. Greenish output of 1150 cc x 24 hours. Per surgery, pt is not improving with conservative measures. Plan to proceed to OR today for ex lap and initiate TPN today. Pt is on her way to OR, RD unable to speak with patient at this time.  Per pharmacy note, will  start TPN with Clinimix E 5/15 at 40 cc/hr tonight, if PICC is placed, and slowly increase to 70 cc/hr to try and meet estimated goals.  Pt is at nutrition risk given chronic medical issues (HIV.)  NUTRITION DIAGNOSIS: Inadequate oral intake r/t altered GI function AEB NPO status and need for TPN.  MONITORING/EVALUATION(Goals): Goal: Pt to meet >/= 90% of their estimated nutrition needs Monitor: weight trends, lab trends, I/O's, TPN tolerance/adequacy, transition to oral diet  EDUCATION NEEDS: -No education needs identified at this time  Jarold Motto MS, RD, LDN Pager: [phone] After-hours pager: [phone]

## 2012-02-12 NOTE — Progress Notes (Addendum)
Subjective: [name] was seen and examined while sitting in a chair this morning.  She was just seen by the surgery team who informed her that she will be going to the OR this afternoon.  She is tearful and a little anxious but understands that she needs surgery at this time and would like to get it over with.  She slept well overnight and is complaining of abdominal pain at this time that is well controlled with medication.  No BM or passing gas overnight.  Otherwise, she has no other complaints at this time.  She denies any nausea, vomiting, fever, chills, chest pain, shortness of breath or abdominal pain at this time.  NGT still in place, greenish output 1150cc over 24hours.  Abdominal xray no major change since yesterday with high grade SBO.   Objective: Vital signs in last 24 hours: Filed Vitals:   02/11/12 1815 02/11/12 2203 02/16/12 0638 02-16-12 0700  BP: 168/75 160/55 181/73 168/60  Pulse: 81 97 85   Temp: 98.6 F (37 C) 98.4 F (36.9 C) 98.6 F (37 C)   TempSrc: Oral Oral Oral   Resp: 18 18 18    Height:      Weight:      SpO2: 94% 97% 98%    Weight change:   Intake/Output Summary (Last 24 hours) at 02-16-12 0836 Last data filed at 2012-02-16 0500  Gross per 24 hour  Intake   3348 ml  Output   1150 ml  Net   2198 ml   General: sitting in chair, tearful HEENT: PERRLA, EOMI Cardiac: Tachycardia, no rubs, murmurs or gallops Pulm: clear to auscultation bilaterally, -wheezing Abd: soft, mildly diffusely tender--crampy in nature after she heard the news, nondistended, BS normoactive Ext: warm and well perfused, no pedal edema Neuro: alert and oriented X3, cranial nerves II-XII grossly intact  Lab Results: Basic Metabolic Panel:  Lab 02/11/12 8657 02/10/12 0832  NA 138 137  K 3.4* 3.8  CL 104 104  CO2 18* 20  GLUCOSE 64* 74  BUN 10 15  CREATININE 0.51 0.54  CALCIUM 9.0 8.8  MG -- --  PHOS -- --   Liver Function Tests:  Lab 02/08/12 0508  AST 18  ALT  10  ALKPHOS 117  BILITOT 0.3  PROT 8.2  ALBUMIN 3.9    Lab 02/08/12 0508  LIPASE 36  AMYLASE --   CBC:  Lab 02/09/12 0840 02/08/12 1219  WBC 5.9 7.9  NEUTROABS -- --  HGB 12.1 13.7  HCT 35.9* 39.3  MCV 96.8 96.8  PLT 296 321   Cardiac Enzymes:  Lab 02/08/12 1259 02/08/12 0812  CKTOTAL -- 59  CKMB -- 6.4*  CKMBINDEX -- --  TROPONINI <0.30 --   Coagulation:  Lab 02/08/12 0811  LABPROT 12.8  INR 0.97   Urinalysis:  Lab 02/08/12 1316  COLORURINE YELLOW  LABSPEC 1.015  PHURINE 7.0  GLUCOSEU NEGATIVE  HGBUR TRACE*  BILIRUBINUR NEGATIVE  KETONESUR 15*  PROTEINUR NEGATIVE  UROBILINOGEN 0.2  NITRITE NEGATIVE  LEUKOCYTESUR NEGATIVE   Studies/Results: Dg Abd 2 Views  2012/02/16  *RADIOLOGY REPORT*  Clinical Data: Small bowel obstruction, follow-up.  ABDOMEN - 2 VIEW  Comparison: 02/11/2012  Findings: Dilated loops of small bowel with air-fluid levels are consistent with a high grade partial small bowel obstruction.  Air and stool are seen within a normal caliber colon.  Bowel anastomosis staples project in the left lower quadrant and surgical vascular clips project in the right upper quadrant reflecting a  prior cholecystectomy.  There is opacity at the right lung base that may reflect atelectasis or infiltrate.  No free air.  A nasogastric tube has its tip in the proximal stomach, which has been inserted further since the prior study.  IMPRESSION: High grade partial small bowel obstruction similar to the previous day's study.  No free air.  Nasogastric tube now has its tip within the stomach.   Original Report Authenticated By: Amie Portland, M.D.    Dg Abd 2 Views  02/11/2012  *RADIOLOGY REPORT*  Clinical Data: Abdominal pain, small bowel obstruction  ABDOMEN - 2 VIEW  Comparison: Prior radiograph 02/09/2012  Findings: Progressive gaseous dilatation of small bowel in the mid abdomen. A linear opacity is incompletely imaged projecting over the mid thoracic spine.  If this  represents a nasogastric tube, it is in the mid thoracic esophagus.  Maximal small bowel diameter measures up to 5.5 cm.  No free air on the upright view although there are multiple air-fluid levels.  Surgical clips noted the right upper quadrant and left lower abdomen.  Patchy opacities noted in the right lung base.  IMPRESSION:  1.  Interval progression of small bowel obstruction.  Maximal small bowel diameter measures 4.9 cm on the upright view, and 5.5 cm on the supine view.  2. Partially imaged linear opacity projects over the mid thoracic spine.  If this represents the marker of a nasogastric tube, the tip of the tube is in the mid thoracic esophagus.  3.  Patchy opacities in the right lung base may reflect atelectasis, consolidation or potentially aspiration in the appropriate clinical setting.  If clinically warranted, consider dedicated chest x-ray to further evaluate.   Original Report Authenticated By: Malachy Moan, M.D.    Dg Abd Portable 1v  02/11/2012  *RADIOLOGY REPORT*  Clinical Data: Nasogastric tube placement at bedside.  PORTABLE ABDOMEN - 1 VIEW 02/11/2012 1046 hours:  Comparison: Two-view abdomen x-ray earlier same date 0606 hours.  Findings: Nasogastric tube tip in the distal esophagus near the esophagogastric junction.  This should be advanced several centimeters.  Multiple dilated loops of small bowel throughout the abdomen, unchanged.  No suggestion of free air on the supine image.  IMPRESSION:  1.  Nasogastric tube tip projects over the distal esophagus just above the esophagogastric junction.  This should be advanced several centimeters. 2.  Small bowel obstruction.  These results were called by telephone on 02/11/2012 at 1102 hours to Lauren, the nurse caring for the patient, who verbally acknowledged these results.   Original Report Authenticated By: Hulan Saas, M.D.    Medications: I have reviewed the patient's current medications. Scheduled Meds:    . antiseptic oral  rinse  15 mL Mouth Rinse q12n4p  . chlorhexidine  15 mL Mouth Rinse BID  . enoxaparin (LOVENOX) injection  40 mg Subcutaneous Q24H  . fentaNYL  50 mcg Transdermal Q72H  . nicotine  21 mg Transdermal Daily  . ondansetron  4 mg Intravenous Q6H  . [COMPLETED] potassium chloride  10 mEq Intravenous Q1 Hr x 3   Continuous Infusions:    . dextrose 5 % and 0.45% NaCl    . [DISCONTINUED] sodium chloride 125 mL/hr at 02/12/12 0106   PRN Meds:.diphenhydrAMINE, hydrALAZINE, HYDROmorphone (DILAUDID) injection Assessment/Plan: 71 yo woman with h/o HIV, HTN and previous abdominal surgeries was admitted on 02/08/12 for  # Partial small bowel obstruction, symptomatic: h/o of multiple abdominal surgeries and is s/p small bowel resection 10/12; CT scan revealed mild distal small  bowel obstruction with transition in the mid pelvis likely due to adhesions.  Last colonoscopy 5/12 with extensive left sided diverticula.  - Surgery following--OR THIS AFTERNOON. - NGT in place, 1150cc output/24 hours,  No improvement on AXR today--high grade SBO. No free air.   - Continue NPO with NGT to intermittent suction  - IVF--D5 1/2NS @100cc /hr - Continue pain mgmt with Dilaudid 2mg  q2h PRN & fentanyl patch - Continue nausea mgmt with zofran q6h prn - requiring med QID - PICC placement today, start TPN  #Hypokalemia: likely related to NG output; K 2.7 02/12/12 -continue to monitor   # HIV disease--well controlled: CD4 = 680 & HIV RNA Quant <20 02/08/12.  Followed by Dr. 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 Brunilda Payor with Urology.   # Tobacco abuse: treatment with nicotine patch during hospitalization   Diet: TPN via PICC  DVT PPX: [place] Lovenox Dispo: Disposition  is deferred at this time, awaiting clinical improvement.  .  The patient does not have a current PCP Jessica Sayre, MD), therefore will be requiring OPC follow-up after discharge.  The patient does not have transportation limitations that hinder transportation to clinic appointments.   LOS: 4 days   Darden Palmer 02/12/2012, 8:36 AM

## 2012-02-12 NOTE — Progress Notes (Signed)
PARENTERAL NUTRITION CONSULT NOTE - INITIAL  Pharmacy Consult for TPN Indication: PSBO  No Known Allergies  Patient Measurements: Height: 5\' 7"  (170.2 cm) Weight: 125 lb 7.1 oz (56.9 kg) IBW/kg (Calculated) : 61.6   Vital Signs: Temp: 98.6 F (37 C) (12/02 1610) Temp src: Oral (12/02 9604) BP: 168/60 mmHg (12/02 0700) Pulse Rate: 85  (12/02 5409) Intake/Output from previous day: 12/01 0701 - 12/02 0700 In: 3348 [I.V.:3008; NG/GT:40; IV Piggyback:300] Out: 1150 [Emesis/NG output:1150] Intake/Output from this shift:    Labs: No results found for this basename: WBC:3,HGB:3,HCT:3,PLT:3,APTT:3,INR:3 in the last 72 hours   Basename 02/12/12 0723 02/11/12 0625 02/10/12 0832  NA 138 138 137  K 3.7 3.4* 3.8  CL 102 104 104  CO2 17* 18* 20  GLUCOSE 58* 64* 74  BUN 6 10 15   CREATININE 0.46* 0.51 0.54  LABCREA -- -- --  CREAT24HRUR -- -- --  CALCIUM 9.5 9.0 8.8  MG -- -- --  PHOS -- -- --  PROT -- -- --  ALBUMIN -- -- --  AST -- -- --  ALT -- -- --  ALKPHOS -- -- --  BILITOT -- -- --  BILIDIR -- -- --  IBILI -- -- --  PREALBUMIN -- -- --  TRIG -- -- --  CHOLHDL -- -- --  CHOL -- -- --   Estimated Creatinine Clearance: 59.6 ml/min (by C-G formula based on Cr of 0.46).   No results found for this basename: GLUCAP:3 in the last 72 hours  Medical History: Past Medical History  Diagnosis Date  . Glaucoma   . Arthritis   . Hypertension   . Hyperlipidemia   . SBO (small bowel obstruction) 12/25/2010  . Peripheral vascular disease   . 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 Chronic lower back pain   . Chronic pain   . Chest wall pain     Medications:  Prescriptions prior to admission  Medication Sig Dispense Refill  . acetaminophen (TYLENOL) 500 MG tablet Take 500 mg by mouth every 4 (four) hours as needed. For pain      . acyclovir (ZOVIRAX) 400 MG tablet Take 1 tablet (400 mg total) by mouth 3 (three) times daily. as  needed for herpes outbreak  21 tablet  prn  . ALPRAZolam (XANAX) 1 MG tablet Take 1 mg by mouth 3 (three) times daily as needed. For anxiety      . aspirin 81 MG EC tablet Take 81 mg by mouth daily.        . benazepril (LOTENSIN) 40 MG tablet Take 40 mg by mouth at bedtime.      . cyclobenzaprine (FLEXERIL) 10 MG tablet Take 1 tablet (10 mg total) by mouth 3 (three) times daily as needed. For spasms  90 tablet  5  . efavirenz-emtricitabine-tenofovir (ATRIPLA) 600-200-300 MG per tablet Take 1 tablet by mouth at bedtime.  30 tablet  prn  . ENSURE (ENSURE) Take 237 mLs by mouth 2 (two) times daily.  237 mL  11  . fentaNYL (DURAGESIC) 50 MCG/HR Place 1 patch (50 mcg total) onto the skin every 3 (three) days.  10 patch  0  . HYDROcodone-acetaminophen (LORCET) 10-650 MG per tablet Take 1 tablet by mouth 2 (two) times daily as needed for pain. PRN  60 tablet  2  . lovastatin (MEVACOR) 40 MG tablet Take 1 tablet (40 mg total) by mouth at bedtime.  30 tablet  5  . Multiple Vitamin (MULTIVITAMIN WITH MINERALS) TABS Take 1 tablet by mouth daily.      Marland Kitchen senna-docusate (SENOKOT-S) 8.6-50 MG per tablet Take 2 tablets by mouth 2 (two) times daily.  120 tablet  3  . Travoprost, BAK Free, (TRAVATAN) 0.004 % SOLN ophthalmic solution Place 1 drop into both eyes at bedtime.  1 Bottle  0  . triamcinolone ointment (KENALOG) 0.1 % Apply 1 application topically 2 (two) times daily.  454 g  3    Insulin Requirements in the past 24 hours:  None  Current Nutrition:  NPO  Nutritional Goals: Estimated, pending RD assessment ~ 1550 kCal, ~ 85 grams of protein per day  Assessment: Admit: admitted 11/28 with abd pain, bilious emesis and no flatus/BM x 2 days PTA. GI: Abd pain ongoing. NGT in place with 1.5L out. Hx of prior abd surgeries including SBO with SBR; now CT with SBO likely d/t adhesions. Was made NPO and NGT placed for conservative management, but now with plans for OR for exp lap today. Refeeding risk low,  but would titrate nutrition slowly d/t NPO x 5 days.  Endo: No A1c or TSH check this admit. No prior hx DM. No CBG checks, but glucoses on BMET have been < 120. Lytes: No Mg or Phos at baseline. Other lytes are OK. With ileus, would aim for K ~4 and Mg ~2. Generally aim for Phos ~3. Noted MIVF is D545NS at 100cc/hr. Renal: Scr nml and stable, will order strict i/o while on TPN Pulm: RA, sats 98%, stable. Cards: Hx HTN, HLD. Home meds not resumed, BP elevated.  Hepatobil: LFTs nml at BL. Cholesterol panel not checked this admit. Neuro: Continues to have abd pain. ID: Afeb, WBC nml. Periop mefoxitin ordered. Hx of HIV, meds not resumed d.t NPO status, CD4 1210 and viral load <40 in 06/2011. Best Practices: LMWH TPN Access: PICC placement pending today, 02/12/2012 TPN day#: Potential start 12/2>>  Plan:  - Will start TPN with Clinimix E 5/15 at 40 cc/hr tonight, if PICC is placed, and slowly increase to 70 cc/hr to try and meet estimated goals. - Will await RD assessment of goals. - Will supplement MVI, trace elements and IV fats on MWF due to ongoing national shortages. - Will decrease MIVF to 55cc/hr when TPN starts tonight to maintain daily fluid intake at ~ 2.3L. - Will start SSI and CBG monitoring when TPN starts tonight- if patient maintains good CBG control at goal TPN with no or minimal SSI need, will d/c SSI and CBGs. - Noted MD replacing K today- will hold off on giving more K prior to TPN start. - Will f/up labs in AM  Thanks, Jenille Laszlo K. Allena Katz, PharmD, BCPS.  Clinical Pharmacist Pager [phone]. 02/12/2012 9:44 AM

## 2012-02-12 NOTE — OR Nursing (Signed)
Spoke with dr smith/MDA re hr 40's and sbp 90's----> orders for robinul and IVF's rec'd

## 2012-02-12 NOTE — Transfer of Care (Signed)
Immediate Anesthesia Transfer of Care Note  Patient: Jessica [name]  Procedure(s) Performed: Procedure(s) (LRB) with comments: EXPLORATORY LAPAROTOMY (N/A) - Exploratory Laparotomy, lysis of adhesions  Patient Location: PACU  Anesthesia Type:General  Level of Consciousness: awake  Airway & Oxygen Therapy: Patient Spontanous Breathing and Patient connected to face mask oxygen  Post-op Assessment: Report given to PACU RN, Post -op Vital signs reviewed and stable and Patient moving all extremities  Post vital signs: Reviewed and stable  Complications: No apparent anesthesia complications

## 2012-02-12 NOTE — Progress Notes (Signed)
Peripherally Inserted Central Catheter/Midline Placement  The IV Nurse has discussed with the patient and/or persons authorized to consent for the patient, the purpose of this procedure and the potential benefits and risks involved with this procedure.  The benefits include less needle sticks, lab draws from the catheter and patient may be discharged home with the catheter.  Risks include, but not limited to, infection, bleeding, blood clot (thrombus formation), and puncture of an artery; nerve damage and irregular heat beat.  Alternatives to this procedure were also discussed.  PICC/Midline Placement Documentation        Jessica [name] 02/12/2012, 12:15 PM

## 2012-02-12 NOTE — Op Note (Signed)
PRE-OPERATIVE DIAGNOSIS: SBO  POST-OPERATIVE DIAGNOSIS:  Same  PROCEDURE:  Procedure(s): Exploratory laparotomy, lysis of adhesions  SURGEON:  Surgeon(s): Almond Lint, MD  ASSIST:  Barnetta Chapel, PA-C  ANESTHESIA:   general  DRAINS: none   LOCAL MEDICATIONS USED:  NONE  SPECIMEN:  No Specimen  DISPOSITION OF SPECIMEN:  N/A  COUNTS:  YES  DICTATION: .Dragon Dictation  PLAN OF CARE: Admit to inpatient   PATIENT DISPOSITION:  PACU - hemodynamically stable.   PROCEDURE:  Patient was identified in the holding area and taken to the operating room where she was placed supine on the operating room table. General anesthesia was induced and a Foley catheter was placed. Her abdomen was prepped and draped in sterile fashion. Timeouts performed according to the surgical safety checklist. When all was correct, we continued.  The midline incision was incised with a #10 blade. This was done in the old scar.  The fascia was elevated away from the abdominal contents and incised with a #10 blade. Once it was clear we were in the peritoneal cavity the cokers were used to elevate the abdominal wall and sharp dissection was used to take down the small bowel from the posterior abdominal wall. These were filmy adhesions.  The small bowel was quite dilated proximally. The point of obstruction was found in the right lower quadrant. There was an area that was twisted and adhesed. This band was released with a combination of cautery and Metzenbaum scissors.  The terminal ileum was not identifiable because of the remaining adhesions. This was partially run into the pelvis and there was an area of dilated bowel in the pelvis in an area of decompressed bowel exiting the pelvis. This was carefully dissected sharply. This was able to be pulled out from the pelvis without any serosal defect. The remaining small bowel was all decompressed and did go into the pelvis and back up to the cecum. Because of the density  of these adhesions, these were not taken down. The abdomen was irrigated. The small bowel that was dissected was run and there was no evidence of serosal injury or defect. The bowel was placed back into the abdomen on top of Seprafilm in the pelvis. Seprafilm was placed underneath the midline incision. The midline was then closed with #1 looped PDS suture in a running fashion. The skin was irrigated and closed with skin staples.  The incision was cleaned, dried, and dressed with a Covaderm dressing. The patient was awakened from anesthesia and taken to the PACU in stable condition. Needle, sponge, and instrument counts were correct x2.

## 2012-02-12 NOTE — Preoperative (Signed)
Beta Blockers   Reason not to administer Beta Blockers:Not Applicable 

## 2012-02-12 NOTE — Progress Notes (Signed)
Patient ID: Jessica [name], female   DOB: 16-Nov-1940, 71 y.o.   MRN: [id number]    Subjective: Pt still without any bowel function.  Still with abdominal pain  Objective: Vital signs in last 24 hours: Temp:  [98.2 F (36.8 C)-98.6 F (37 C)] 98.6 F (37 C) 02/18/2023 6578) Pulse Rate:  [81-97] 85  18-Feb-2023 0638) Resp:  [18] 18  2023/02/18 4696) BP: (160-181)/(55-75) 168/60 mmHg 2023-02-18 0700) SpO2:  [94 %-98 %] 98 % 18-Feb-2023 0638) Last BM Date: 02/06/12  Intake/Output from previous day: 12/01 0701 - 2023/02/18 0700 In: 3348 [I.V.:3008; NG/GT:40; IV Piggyback:300] Out: 1150 [Emesis/NG output:1150] Intake/Output this shift:    PE: Abd: soft, NGT with cloudy bilious output, tender to palpation Heart: regular Lungs: CTAB  Lab Results:  No results found for this basename: WBC:2,HGB:2,HCT:2,PLT:2 in the last 72 hours BMET  [facility] 02/11/12 0625 02/10/12 0832  NA 138 137  K 3.4* 3.8  CL 104 104  CO2 18* 20  GLUCOSE 64* 74  BUN 10 15  CREATININE 0.51 0.54  CALCIUM 9.0 8.8   PT/INR No results found for this basename: LABPROT:2,INR:2 in the last 72 hours CMP     Component Value Date/Time   NA 138 02/11/2012 0625   K 3.4* 02/11/2012 0625   CL 104 02/11/2012 0625   CO2 18* 02/11/2012 0625   GLUCOSE 64* 02/11/2012 0625   BUN 10 02/11/2012 0625   CREATININE 0.51 02/11/2012 0625   CREATININE 0.62 10/20/2011 1006   CALCIUM 9.0 02/11/2012 0625   PROT 8.2 02/08/2012 0508   ALBUMIN 3.9 02/08/2012 0508   AST 18 02/08/2012 0508   ALT 10 02/08/2012 0508   ALKPHOS 117 02/08/2012 0508   BILITOT 0.3 02/08/2012 0508   GFRNONAA >90 02/11/2012 0625   GFRAA >90 02/11/2012 0625   Lipase     Component Value Date/Time   LIPASE 36 02/08/2012 0508       Studies/Results: Dg Abd 2 Views  2012/02/18  *RADIOLOGY REPORT*  Clinical Data: Small bowel obstruction, follow-up.  ABDOMEN - 2 VIEW  Comparison: 02/11/2012  Findings: Dilated loops of small bowel with air-fluid levels are consistent with a high  grade partial small bowel obstruction.  Air and stool are seen within a normal caliber colon.  Bowel anastomosis staples project in the left lower quadrant and surgical vascular clips project in the right upper quadrant reflecting a prior cholecystectomy.  There is opacity at the right lung base that may reflect atelectasis or infiltrate.  No free air.  A nasogastric tube has its tip in the proximal stomach, which has been inserted further since the prior study.  IMPRESSION: High grade partial small bowel obstruction similar to the previous day's study.  No free air.  Nasogastric tube now has its tip within the stomach.   Original Report Authenticated By: Amie Portland, M.D.    Dg Abd 2 Views  02/11/2012  *RADIOLOGY REPORT*  Clinical Data: Abdominal pain, small bowel obstruction  ABDOMEN - 2 VIEW  Comparison: Prior radiograph 02/09/2012  Findings: Progressive gaseous dilatation of small bowel in the mid abdomen. A linear opacity is incompletely imaged projecting over the mid thoracic spine.  If this represents a nasogastric tube, it is in the mid thoracic esophagus.  Maximal small bowel diameter measures up to 5.5 cm.  No free air on the upright view although there are multiple air-fluid levels.  Surgical clips noted the right upper quadrant and left lower abdomen.  Patchy opacities noted in the right lung  base.  IMPRESSION:  1.  Interval progression of small bowel obstruction.  Maximal small bowel diameter measures 4.9 cm on the upright view, and 5.5 cm on the supine view.  2. Partially imaged linear opacity projects over the mid thoracic spine.  If this represents the marker of a nasogastric tube, the tip of the tube is in the mid thoracic esophagus.  3.  Patchy opacities in the right lung base may reflect atelectasis, consolidation or potentially aspiration in the appropriate clinical setting.  If clinically warranted, consider dedicated chest x-ray to further evaluate.   Original Report Authenticated By: Malachy Moan, M.D.    Dg Abd Portable 1v  02/11/2012  *RADIOLOGY REPORT*  Clinical Data: Nasogastric tube placement at bedside.  PORTABLE ABDOMEN - 1 VIEW 02/11/2012 1046 hours:  Comparison: Two-view abdomen x-ray earlier same date 0606 hours.  Findings: Nasogastric tube tip in the distal esophagus near the esophagogastric junction.  This should be advanced several centimeters.  Multiple dilated loops of small bowel throughout the abdomen, unchanged.  No suggestion of free air on the supine image.  IMPRESSION:  1.  Nasogastric tube tip projects over the distal esophagus just above the esophagogastric junction.  This should be advanced several centimeters. 2.  Small bowel obstruction.  These results were called by telephone on 02/11/2012 at 1102 hours to Lauren, the nurse caring for the patient, who verbally acknowledged these results.   Original Report Authenticated By: Hulan Saas, M.D.     Anti-infectives: Anti-infectives    None       Assessment/Plan  1. High grade PSBO 2. HIV 3. PCM/TNA 4. Hypokalemia, replete K+ prior to OR Plan: 1. Patient is not improving with conservative measures.  We will proceed to OR today for ex lap.  I have explained the procedure including risks and complications to the patient.  These include bleeding infection, bowel resection, wound infection, post op bowel leak requiring repeat surgery, fistula, etc.  She understands and wishes to proceed. 2. Will also start TNA via PICC   LOS: 4 days    Malcolm Quast E 02/12/2012, 8:46 AM Pager: [phone]

## 2012-02-12 NOTE — Anesthesia Preprocedure Evaluation (Signed)
Anesthesia Evaluation  Patient identified by MRN, date of birth, ID band Patient awake    Reviewed: Allergy & Precautions, H&P , NPO status , Patient's Chart, lab work & pertinent test results  History of Anesthesia Complications (+) AWARENESS UNDER ANESTHESIA  Airway Mallampati: I TM Distance: >3 FB Neck ROM: full    Dental   Pulmonary Current Smoker,          Cardiovascular hypertension, + Peripheral Vascular Disease Rhythm:regular Rate:Normal     Neuro/Psych PSYCHIATRIC DISORDERS    GI/Hepatic   Endo/Other    Renal/GU      Musculoskeletal   Abdominal   Peds  Hematology   Anesthesia Other Findings HIV , Bowel obstruction  Reproductive/Obstetrics                           Anesthesia Physical Anesthesia Plan  ASA: III  Anesthesia Plan: General   Post-op Pain Management:    Induction: Intravenous  Airway Management Planned: Oral ETT  Additional Equipment:   Intra-op Plan:   Post-operative Plan: Extubation in OR  Informed Consent: I have reviewed the patients History and Physical, chart, labs and discussed the procedure including the risks, benefits and alternatives for the proposed anesthesia with the patient or authorized representative who has indicated his/her understanding and acceptance.     Plan Discussed with: CRNA, Anesthesiologist and Surgeon  Anesthesia Plan Comments:         Anesthesia Quick Evaluation

## 2012-02-12 NOTE — Anesthesia Postprocedure Evaluation (Signed)
  Anesthesia Post-op Note  Patient: Jessica [name]  Procedure(s) Performed: Procedure(s) (LRB) with comments: EXPLORATORY LAPAROTOMY (N/A) - Exploratory Laparotomy, lysis of adhesions  Patient Location: PACU  Anesthesia Type:General  Level of Consciousness: awake, oriented and patient cooperative  Airway and Oxygen Therapy: Patient Spontanous Breathing  Post-op Pain: moderate  Post-op Assessment: Post-op Vital signs reviewed, Patient's Cardiovascular Status Stable, Respiratory Function Stable, Patent Airway, No signs of Nausea or vomiting and Pain level controlled  Post-op Vital Signs: stable  Complications: No apparent anesthesia complications

## 2012-02-12 NOTE — Clinical Documentation Improvement (Signed)
HIV Documentation Clarification Query  THIS DOCUMENT IS NOT A PERMANENT PART OF THE MEDICAL RECORD  TO RESPOND TO THE THIS QUERY, FOLLOW THE INSTRUCTIONS BELOW:  1. If needed, update documentation for the patient's encounter via the notes activity.  2. Access this query again and click edit on the In [employer].  3. After updating, or not, click F2 to complete all highlighted (required) fields concerning your review. Select "additional documentation in the medical record" OR "no additional documentation provided".  4. Click Sign note button.  5. The deficiency will fall out of your In Basket *Please let us know if you are not able to complete this workflow by phone or e-mail (listed below).        02/12/12  Dear Dr. Darden Palmer [facility]  In an effort to better capture your patient's severity of illness, reflect appropriate length of stay and utilization of resources, please clarify HIV status.  Past medical history list HIV Disease but only HIV is being documented.   Based on your clinical judgment, please clarify and document in a progress note and/or discharge summary the clinical condition associated with the following supporting information:  In responding to this query please exercise your independent judgment.  The fact that a query is asked, does not imply that any particular answer is desired or expected.  Possible Clinical Conditions  AIDS  HIV Disease  HIV (+) unsymptomatic " Symptomatic HIV  Other Condition  Cannot Clinically Determine    Supporting Information: .  HIV disease 03/27/1986 (listed as past medical history per [facility])    Diagnostics: Results for LELIANA, KONTZ (MRN [id number]) as of 02/12/2012 11:35  Ref. Range 10/27/2010 00:00 03/16/2011 17:07 06/15/2011 00:00 10/20/2011 10:06 02/08/2012 10:03  HIV-1 RNA Viral Load No range found <40  <40    HIV 1 RNA Quant Latest Range: <20 copies/mL  <20  <20 <20  HIV1 RNA Quant, Log Latest Range:  <1.30 log 10  <1.30  <1.30 <1.30     Results for PERLE, BRICKHOUSE (MRN [id number]) as of 02/12/2012 11:35  Ref. Range 01/07/1988 00:00 12/23/2008 09:27 12/09/2009 11:32 06/29/2010 00:00 10/27/2010 00:00 03/16/2011 15:19 06/15/2011 00:00 10/20/2011 09:00 02/08/2012 10:03  CD4 No range found    1859 1167  1210    CD4 Count No range found 432 1468 1258        CD4 T Cell Abs Latest Range: [phone] cmm 432     1720  1190 680  CD4 % Helper T Cell Latest Range: 33-55 %      37  37 30 (L)  CD8 % Suppressor T Cell No range found    38.6 44.0  43.7    CD4% No range found    41.3 38.9  37.8     Treatment  Atripla (on Hold while NPO)   You may use possible, probable, or suspect with inpatient documentation. Possible, probable, suspected diagnoses MUST be documented at the time of discharge.  Reviewed: additional documentation in the medical record  Thank You,  Rossie Muskrat RN, BSN Clinical Documentation Improvement Specialist Telephone # [phone]/Pager: [phone] [email or web address]  Health Information Management [place]

## 2012-02-13 ENCOUNTER — Encounter (HOSPITAL_COMMUNITY): Payer: Self-pay | Admitting: General Surgery

## 2012-02-13 ENCOUNTER — Inpatient Hospital Stay (HOSPITAL_COMMUNITY): Payer: Medicare Other

## 2012-02-13 LAB — PREALBUMIN: Prealbumin: 11.1 mg/dL — ABNORMAL LOW (ref 17.0–34.0)

## 2012-02-13 LAB — COMPREHENSIVE METABOLIC PANEL
BUN: 7 mg/dL (ref 6–23)
CO2: 19 mEq/L (ref 19–32)
Chloride: 104 mEq/L (ref 96–112)
Creatinine, Ser: 0.49 mg/dL — ABNORMAL LOW (ref 0.50–1.10)
GFR calc Af Amer: >90 mL/min (ref >90)
GFR calc non Af Amer: >90 mL/min (ref >90)
Glucose, Bld: 233 mg/dL — ABNORMAL HIGH (ref 70–99)
Total Bilirubin: 0.2 mg/dL — ABNORMAL LOW (ref 0.3–1.2)

## 2012-02-13 LAB — DIFFERENTIAL
Basophils Absolute: 0 10*3/uL (ref 0.0–0.1)
Eosinophils Relative: 2 % (ref 0–5)
Lymphocytes Relative: 20 % (ref 12–46)
Lymphs Abs: 1.1 10*3/uL (ref 0.7–4.0)
Monocytes Absolute: 0.3 10*3/uL (ref 0.1–1.0)
Neutro Abs: 3.7 10*3/uL (ref 1.7–7.7)

## 2012-02-13 LAB — MAGNESIUM: Magnesium: 1.4 mg/dL — ABNORMAL LOW (ref 1.5–2.5)

## 2012-02-13 LAB — GLUCOSE, CAPILLARY
Glucose-Capillary: 199 mg/dL — ABNORMAL HIGH (ref 70–99)
Glucose-Capillary: 187 mg/dL — ABNORMAL HIGH (ref 70–99)

## 2012-02-13 LAB — URINALYSIS, ROUTINE W REFLEX MICROSCOPIC
Bilirubin Urine: NEGATIVE
Ketones, ur: NEGATIVE mg/dL
Leukocytes, UA: NEGATIVE
Nitrite: NEGATIVE
Protein, ur: NEGATIVE mg/dL
Urobilinogen, UA: 0.2 mg/dL (ref 0.0–1.0)

## 2012-02-13 LAB — CHOLESTEROL, TOTAL: Cholesterol: 86 mg/dL (ref 0–200)

## 2012-02-13 LAB — TRIGLYCERIDES: Triglycerides: 51 mg/dL (ref <150)

## 2012-02-13 LAB — CBC
Hemoglobin: 14.4 g/dL (ref 12.0–15.0)
MCV: 92.9 fL (ref 78.0–100.0)

## 2012-02-13 LAB — URINE MICROSCOPIC-ADD ON

## 2012-02-13 LAB — POCT I-STAT GLUCOSE: Glucose, Bld: 121 mg/dL — ABNORMAL HIGH (ref 70–99)

## 2012-02-13 MED ORDER — ONDANSETRON HCL 4 MG/2ML IJ SOLN: 4.0000 mg | Freq: Four times a day (QID) | INTRAMUSCULAR | Status: DC | PRN

## 2012-02-13 MED ORDER — HYDROMORPHONE HCL PF 1 MG/ML IJ SOLN: 0.5000 mg | INTRAMUSCULAR | Status: DC | PRN

## 2012-02-13 MED ORDER — FOLIC ACID 5 MG/ML IJ SOLN
1.0000 mg | Freq: Every day | INTRAMUSCULAR | Status: AC
  Administered 2012-02-13 – 2012-02-15 (×3): 1 mg via INTRAVENOUS
  Filled 2012-02-13 (×4): qty 0.2

## 2012-02-13 MED ORDER — THIAMINE HCL 100 MG/ML IJ SOLN
100.0000 mg | Freq: Every day | INTRAMUSCULAR | Status: AC
  Administered 2012-02-13 – 2012-02-15 (×3): 100 mg via INTRAVENOUS
  Filled 2012-02-13 (×3): qty 1

## 2012-02-13 MED ORDER — DEXTROSE 5 % IV SOLN
2.0000 g | Freq: Four times a day (QID) | INTRAVENOUS | Status: DC
  Administered 2012-02-13 (×2): 2 g via INTRAVENOUS
  Filled 2012-02-13 (×3): qty 2

## 2012-02-13 MED ORDER — POTASSIUM CHLORIDE 10 MEQ/50ML IV SOLN
10.0000 meq | INTRAVENOUS | Status: AC
  Administered 2012-02-13 (×4): 10 meq via INTRAVENOUS
  Filled 2012-02-13 (×4): qty 50

## 2012-02-13 MED ORDER — CLINIMIX E/DEXTROSE (5/15) 5 % IV SOLN
INTRAVENOUS | Status: AC
  Administered 2012-02-13: 17:00:00 via INTRAVENOUS
  Filled 2012-02-13: qty 1000

## 2012-02-13 MED ORDER — SODIUM CHLORIDE 0.9 % IJ SOLN: 9.0000 mL | INTRAMUSCULAR | Status: DC | PRN

## 2012-02-13 MED ORDER — DIPHENHYDRAMINE HCL 50 MG/ML IJ SOLN: 12.5000 mg | Freq: Four times a day (QID) | INTRAMUSCULAR | Status: DC | PRN

## 2012-02-13 MED ORDER — HYDROMORPHONE 0.3 MG/ML IV SOLN
INTRAVENOUS | Status: DC
  Administered 2012-02-13 (×2): via INTRAVENOUS
  Administered 2012-02-13: 7.94 mg via INTRAVENOUS
  Administered 2012-02-13: 3 mg via INTRAVENOUS
  Administered 2012-02-13: 6.99 mg via INTRAVENOUS
  Administered 2012-02-14: 2.99 mg via INTRAVENOUS
  Administered 2012-02-14: 04:00:00 via INTRAVENOUS
  Administered 2012-02-14: 1.49 mg via INTRAVENOUS
  Administered 2012-02-14: 7.46 mg via INTRAVENOUS
  Administered 2012-02-14: 4.99 mg via INTRAVENOUS
  Administered 2012-02-14: 09:00:00 via INTRAVENOUS
  Filled 2012-02-13 (×5): qty 25

## 2012-02-13 MED ORDER — DIPHENHYDRAMINE HCL 12.5 MG/5ML PO ELIX
12.5000 mg | ORAL_SOLUTION | Freq: Four times a day (QID) | ORAL | Status: DC | PRN
  Filled 2012-02-13: qty 5

## 2012-02-13 MED ORDER — SODIUM CHLORIDE 0.9 % IV SOLN
INTRAVENOUS | Status: DC
  Administered 2012-02-13: 1000 mL via INTRAVENOUS
  Administered 2012-02-14 – 2012-02-16 (×3): via INTRAVENOUS

## 2012-02-13 MED ORDER — INSULIN ASPART 100 UNIT/ML ~~LOC~~ SOLN
0.0000 [IU] | SUBCUTANEOUS | Status: DC
  Administered 2012-02-13: 1 [IU] via SUBCUTANEOUS
  Administered 2012-02-13: 2 [IU] via SUBCUTANEOUS
  Administered 2012-02-13: 1 [IU] via SUBCUTANEOUS
  Administered 2012-02-13: 2 [IU] via SUBCUTANEOUS
  Administered 2012-02-14 – 2012-02-16 (×9): 1 [IU] via SUBCUTANEOUS

## 2012-02-13 MED ORDER — HYDROMORPHONE 0.3 MG/ML IV SOLN: INTRAVENOUS | Status: DC

## 2012-02-13 MED ORDER — DEXTROSE 5 % IV SOLN
30.0000 mmol | Freq: Once | INTRAVENOUS | Status: AC
  Administered 2012-02-13: 30 mmol via INTRAVENOUS
  Filled 2012-02-13: qty 10

## 2012-02-13 MED ORDER — ACETAMINOPHEN 10 MG/ML IV SOLN
1000.0000 mg | Freq: Once | INTRAVENOUS | Status: AC
  Administered 2012-02-13: 1000 mg via INTRAVENOUS
  Filled 2012-02-13: qty 100

## 2012-02-13 MED ORDER — MAGNESIUM SULFATE 40 MG/ML IJ SOLN
4.0000 g | Freq: Once | INTRAMUSCULAR | Status: AC
  Administered 2012-02-13: 4 g via INTRAVENOUS
  Filled 2012-02-13: qty 100

## 2012-02-13 MED ORDER — NALOXONE HCL 0.4 MG/ML IJ SOLN: 0.4000 mg | INTRAMUSCULAR | Status: DC | PRN

## 2012-02-13 NOTE — Progress Notes (Signed)
Subjective: [name] was seen and examined while lying in bed.  She is post op day #1 s/p ex lap with LOA for PSBO.  She is still complaining of abdominal pain but claims her pain medication is working.  NGT still in place draining bilious green with 3070cc/24 hours.  she denies passing any gas and no BM.  She did sleep a little overnight that made her feel better.  Overnight, noted to have Tmax  102.2 and HR 120s.  Given tylenol, blood and urine cx's ordered, U/A negative, and continued on Cefoxitin post op.   Otherwise, she has no other complaints.  She denies any nausea, vomiting, fever, chills, chest pain, shortness of breath at this time.   Objective: Vital signs in last 24 hours: Filed Vitals:   02/13/12 0600 02/13/12 0844 02/13/12 1009 02/13/12 1202  BP: 127/71  141/73   Pulse: 109  98   Temp: 99.7 F (37.6 C)  99.4 F (37.4 C)   TempSrc:   Oral   Resp: 20 15 20 16   Height:      Weight:      SpO2: 98% 97% 96% 95%   Weight change:   Intake/Output Summary (Last 24 hours) at 02/13/12 1250 Last data filed at 02/13/12 0600  Gross per 24 hour  Intake 3462.84 ml  Output   2670 ml  Net 792.84 ml   General: sitting in chair, tearful HEENT: PERRLA, EOMI Cardiac: Tachycardia, no rubs, murmurs or gallops Pulm: clear to auscultation bilaterally, -wheezing Abd: soft, midline dressing clean and dry in place, soft, decreased bowel sounds.   Ext: warm and well perfused, no pedal edema, +2dp b/l Neuro: alert and oriented X3, cranial nerves II-XII grossly intact  Lab Results: Basic Metabolic Panel:  Lab 02/13/12 1610 02/12/12 1907 02/12/12 1429 02/12/12 0723  NA 133* -- -- 138  K 3.1* -- -- 3.7  CL 104 -- -- 102  CO2 19 -- -- 17*  GLUCOSE 233* -- 121* --  BUN 7 -- -- 6  CREATININE 0.49* 0.42* -- --  CALCIUM 8.6 -- -- 9.5  MG 1.4* -- -- 1.6  PHOS 1.6* -- -- --   Liver Function Tests:  Lab 02/13/12 0500 02/08/12 0508  AST 18 18  ALT 9 10  ALKPHOS 69 117  BILITOT  0.2* 0.3  PROT 5.8* 8.2  ALBUMIN 2.6* 3.9    Lab 02/08/12 0508  LIPASE 36  AMYLASE --   CBC:  Lab 02/13/12 0500 02/12/12 1907  WBC 5.2 5.1  NEUTROABS 3.7 --  HGB 14.4 13.4  HCT 40.3 37.7  MCV 92.9 94.0  PLT 305 268   Cardiac Enzymes:  Lab 02/08/12 1259 02/08/12 0812  CKTOTAL -- 59  CKMB -- 6.4*  CKMBINDEX -- --  TROPONINI <0.30 --   Coagulation:  Lab 02/08/12 0811  LABPROT 12.8  INR 0.97   Urinalysis:  Lab 02/13/12 0340 02/08/12 1316  COLORURINE YELLOW YELLOW  LABSPEC 1.012 1.015  PHURINE 7.0 7.0  GLUCOSEU 100* NEGATIVE  HGBUR SMALL* TRACE*  BILIRUBINUR NEGATIVE NEGATIVE  KETONESUR NEGATIVE 15*  PROTEINUR NEGATIVE NEGATIVE  UROBILINOGEN 0.2 0.2  NITRITE NEGATIVE NEGATIVE  LEUKOCYTESUR NEGATIVE NEGATIVE   Studies/Results: Dg Chest Port 1 View  02/13/2012  *RADIOLOGY REPORT*  Clinical Data: Postop fever, weakness  PORTABLE CHEST - 1 VIEW  Comparison: Portable chest x-ray of 02/12/2012  Findings: There is mild bibasilar linear atelectasis present.  No focal infiltrate or effusion is seen. A right PICC line is present with  the tip overlying the mid upper SVC.  No pneumothorax is noted.  An NG tube extends below the hemidiaphragm.  IMPRESSION:  1.  Right PICC line tip overlies upper SVC.  No pneumothorax. 2.  Mild bibasilar linear atelectasis.   Original Report Authenticated By: Dwyane Dee, M.D.    Dg Chest Port 1 View  02/12/2012  *RADIOLOGY REPORT*  Clinical Data: Preop for exploratory laparotomy.  Left-sided chest pain.  PORTABLE CHEST - 1 VIEW  Comparison: Two-view chest 02/03/2012.  Findings: The heart size is normal.  Mild interstitial coarsening is chronic.  The lung volumes are slightly decreased relative to the prior exam.  Minimal right basilar airspace opacification is present.  No other airspace consolidation is present.  An NG tube is in place.  The visualized soft tissues and bony thorax are unremarkable apart from cholecystectomy clips.  IMPRESSION:  1.   Decreased lung volumes with minimal right basilar airspace disease.  While this likely reflects atelectasis, early infection is not excluded. 2.  Stable chronic interstitial coarsening.   Original Report Authenticated By: Marin Roberts, M.D.    Dg Abd 2 Views  02/12/2012  *RADIOLOGY REPORT*  Clinical Data: Small bowel obstruction, follow-up.  ABDOMEN - 2 VIEW  Comparison: 02/11/2012  Findings: Dilated loops of small bowel with air-fluid levels are consistent with a high grade partial small bowel obstruction.  Air and stool are seen within a normal caliber colon.  Bowel anastomosis staples project in the left lower quadrant and surgical vascular clips project in the right upper quadrant reflecting a prior cholecystectomy.  There is opacity at the right lung base that may reflect atelectasis or infiltrate.  No free air.  A nasogastric tube has its tip in the proximal stomach, which has been inserted further since the prior study.  IMPRESSION: High grade partial small bowel obstruction similar to the previous day's study.  No free air.  Nasogastric tube now has its tip within the stomach.   Original Report Authenticated By: Amie Portland, M.D.    Medications: I have reviewed the patient's current medications. Scheduled Meds:    . [COMPLETED] acetaminophen  1,000 mg Intravenous Once  . antiseptic oral rinse  15 mL Mouth Rinse q12n4p  . [COMPLETED] cefOXitin  2 g Intravenous Once  . cefOXitin  2 g Intravenous Q6H  . chlorhexidine  15 mL Mouth Rinse BID  . fentaNYL  50 mcg Transdermal Q72H  . folic acid  1 mg Intravenous Daily  . [COMPLETED] glycopyrrolate      . heparin  5,000 Units Subcutaneous Q8H  . [EXPIRED] HYDROmorphone      . HYDROmorphone PCA 0.3 mg/mL   Intravenous Q4H  . insulin aspart  0-9 Units Subcutaneous Q4H  . [COMPLETED] magnesium sulfate 1 - 4 g bolus IVPB  4 g Intravenous Once  . nicotine  21 mg Transdermal Daily  . [EXPIRED] potassium chloride  10 mEq Intravenous Q1 Hr x 4   . potassium chloride  10 mEq Intravenous Q1 Hr x 4  . potassium phosphate IVPB (mmol)  30 mmol Intravenous Once  . thiamine  100 mg Intravenous Daily  . [DISCONTINUED] enoxaparin (LOVENOX) injection  40 mg Subcutaneous Q24H  . [DISCONTINUED] HYDROmorphone PCA 0.3 mg/mL   Intravenous Q4H  . [DISCONTINUED] HYDROmorphone PCA 0.3 mg/mL   Intravenous Q4H  . [DISCONTINUED] insulin aspart  0-9 Units Subcutaneous TID WC  . [DISCONTINUED] ondansetron  4 mg Intravenous Q6H   Continuous Infusions:    . sodium chloride 1,000 mL (  02/13/12 1121)  . TPN (CLINIMIX) +/- additives 40 mL/hr at 02/12/12 1723   And  . fat emulsion 240 mL (02/12/12 1723)  . TPN (CLINIMIX) +/- additives    . [DISCONTINUED] dextrose 5 % and 0.45% NaCl 1,000 mL (02/12/12 0913)  . [DISCONTINUED] dextrose 5 % and 0.45% NaCl 1,000 mL (02/12/12 1936)  . [DISCONTINUED] lactated ringers 50 mL/hr at 02/12/12 1256   PRN Meds:.diphenhydrAMINE, diphenhydrAMINE, diphenhydrAMINE, hydrALAZINE, HYDROmorphone (DILAUDID) injection, naloxone, ondansetron (ZOFRAN) IV, sodium chloride, sodium chloride, [DISCONTINUED] 0.9 % irrigation (POUR BTL), [DISCONTINUED] 0.9 % irrigation (POUR BTL), [DISCONTINUED] diphenhydrAMINE, [DISCONTINUED] diphenhydrAMINE, [DISCONTINUED]  HYDROmorphone (DILAUDID) injection, [DISCONTINUED]  HYDROmorphone (DILAUDID) injection, [DISCONTINUED] naloxone [DISCONTINUED] ondansetron (ZOFRAN) IV, [DISCONTINUED] ondansetron (ZOFRAN) IV, [DISCONTINUED] sodium chloride Assessment/Plan: 71 yo woman with h/o HIV, HTN and previous abdominal surgeries was admitted on 02/08/12 for  # Partial small bowel obstruction, symptomatic: h/o of multiple abdominal surgeries and is s/p small bowel resection 10/12; CT scan revealed mild distal small bowel obstruction with transition in the mid pelvis likely due to adhesions.  Last colonoscopy 5/12 with extensive left sided diverticula. S/p ex lap with LOA yesterday--POD #1 - Surgery  following--continue bowl rest and NGT, awaiting bowel function, d/c foley, mobilize and pulmonary toilet - NGT in place, green bilious output 3070cc output/24 hours - Continue bowel rest with NGT - IVF - Continue TPN - Continue pain mgmt with Dilaudid 2mg  q2h PRN & fentanyl patch - Continue nausea mgmt with zofran q6h prn - requiring med QID  # Post operative fever: Tmax 102.90F around 3am.  Given IV tylenol 1000mg  x1 once. Afebrile at this time.  -u/a negative for nitrities and leukocytes, wbc 0-2,  -f/u blood cx x2 -given cefoxitin post op -continue to monitor  #Electrolyte abnormalities: likely related to NG output; K 3.1, Mg 1.4, Phos 1.612/3/13 -replenished -continue to monitor   # HIV disease--well controlled: CD4 = 680 & HIV RNA Quant <20 02/08/12.  Followed by Dr. 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 Brunilda Payor with Urology.   # Tobacco abuse: treatment with nicotine patch during hospitalization   Diet: TPN via PICC  DVT PPX: [place] Lovenox Dispo: Disposition is deferred at this time, awaiting clinical improvement.  .  The patient does not have a current PCP Jessica Sayre, MD), therefore will be requiring OPC follow-up after discharge.  The patient does not have transportation limitations that hinder transportation to clinic appointments.   LOS: 5 days   Darden Palmer 02/13/2012, 12:50 PM

## 2012-02-13 NOTE — Progress Notes (Signed)
S: Patient was febrile with Tmax of 102.255F at 2AM, MD was notified ~3AM. Pt underwent Exploratory laparotomy today with lysis of adhesions with no complications. She reports that she returned to her room (from PACU) approximately at [facility] today.  Currently she complains of nausea and a sore throat. She denies cough, chest pain, or shortness of breath. She has not been able to pass flatus yet.   O: Vitals: Temp: 102.255F, HR: 120, RR 18, BP 163/82, O2: 97% on 2L [place], pain score: 7/10 Gen: Calm, lying in bed, in NAD, asking appropriate questions HEENT: tonsils with no exudate, NG tube in place with bilious output Neck: mildly enlarged left cervical lymph node Pulm: Clear to auscultation bilaterally, no respiratory distress Abd: soft, diffusely tender, bandage midline that is d/c/i with minimum blood at the lower edge. BS present in all 4 quadrants.  Ext: warm and well perfused, no calf tenderness, SCDs in place Neuro: alert and oriented x3, no neurological deficits  A&P:  Immediate postoperative fever: Potential causes to include trauma as part of surgery and immune reaction and possible infection present prior to surgery. Malignant hyperthermia an unlikely possibility given >10 hr post-op status but will be considered if fever persists--in that case dantrolene would be indicated. If fever secondary to immune reaction to surgery trauma itself, resolution of fever is expected within tow to three days. She was given one dose of perioperative prophylaxis treatment with cefoxitin 2g but treatment may be extended to 2g q6h for 24 h post-op.  -Blood cultures x2 -Tylenol 1,000mg  IV once for fever (pt does not tolerate PO or per rectum) -UA and urine culture -Cefoxitin 2g q 6h for ~24h post-op.   Nausea: Mild at this time, in the post-op period, could be anesthesia related. If this persists, will consider given Zofran IV PRN.   SignedSara Chu D 02/13/12, 5:00 AM

## 2012-02-13 NOTE — Progress Notes (Signed)
Patients temperature elevated at 102.2 and HR tachycardic at 110 - 120. Dr. Garald Braver notified and new orders placed. Will continue to monitor patient.

## 2012-02-13 NOTE — Progress Notes (Signed)
Patient ID: Jessica [name], female   DOB: [birth year], 71 y.o.   MRN: [id number] 1 Day Post-Op  Subjective: Pt feels ok today.  C/o pain.  No flatus  Objective: Vital signs in last 24 hours: Temp:  [97.4 F (36.3 C)-102.2 F (39 C)] 99.7 F (37.6 C) (12/03 0600) Pulse Rate:  [41-120] 109  (12/03 0600) Resp:  [10-20] 15  (12/03 0844) BP: (92-189)/(48-82) 127/71 mmHg (12/03 0600) SpO2:  [94 %-100 %] 97 % (12/03 0844) Last BM Date: 02/07/12  Intake/Output from previous day: 12/02 0701 - 12/03 0700 In: 3462.8 [I.V.:2472; NG/GT:60; IV Piggyback:300; TPN:630.8] Out: 3070 [Urine:2320; Emesis/NG output:750] Intake/Output this shift:    PE: Abd: soft, few BS, NGT still with bilious output.  Incision c/d/i with dressing in place  Lab Results:   Basename 02/13/12 0500 02/12/12 1907  WBC 5.2 5.1  HGB 14.4 13.4  HCT 40.3 37.7  PLT 305 268   BMET  Basename 02/13/12 0500 02/12/12 1907 02/12/12 1429 02/12/12 0723  NA 133* -- -- 138  K 3.1* -- -- 3.7  CL 104 -- -- 102  CO2 19 -- -- 17*  GLUCOSE 233* -- 121* --  BUN 7 -- -- 6  CREATININE 0.49* 0.42* -- --  CALCIUM 8.6 -- -- 9.5   PT/INR No results found for this basename: LABPROT:2,INR:2 in the last 72 hours CMP     Component Value Date/Time   NA 133* 02/13/2012 0500   K 3.1* 02/13/2012 0500   CL 104 02/13/2012 0500   CO2 19 02/13/2012 0500   GLUCOSE 233* 02/13/2012 0500   BUN 7 02/13/2012 0500   CREATININE 0.49* 02/13/2012 0500   CREATININE 0.62 10/20/2011 1006   CALCIUM 8.6 02/13/2012 0500   PROT 5.8* 02/13/2012 0500   ALBUMIN 2.6* 02/13/2012 0500   AST 18 02/13/2012 0500   ALT 9 02/13/2012 0500   ALKPHOS 69 02/13/2012 0500   BILITOT 0.2* 02/13/2012 0500   GFRNONAA >90 02/13/2012 0500   GFRAA >90 02/13/2012 0500   Lipase     Component Value Date/Time   LIPASE 36 02/08/2012 0508       Studies/Results: Dg Chest Port 1 View  02/13/2012  *RADIOLOGY REPORT*  Clinical Data: Postop fever, weakness  PORTABLE CHEST - 1  VIEW  Comparison: Portable chest x-ray of 02/12/2012  Findings: There is mild bibasilar linear atelectasis present.  No focal infiltrate or effusion is seen. A right PICC line is present with the tip overlying the mid upper SVC.  No pneumothorax is noted.  An NG tube extends below the hemidiaphragm.  IMPRESSION:  1.  Right PICC line tip overlies upper SVC.  No pneumothorax. 2.  Mild bibasilar linear atelectasis.   Original Report Authenticated By: Dwyane Dee, M.D.    Dg Chest Port 1 View  02/12/2012  *RADIOLOGY REPORT*  Clinical Data: Preop for exploratory laparotomy.  Left-sided chest pain.  PORTABLE CHEST - 1 VIEW  Comparison: Two-view chest 02/03/2012.  Findings: The heart size is normal.  Mild interstitial coarsening is chronic.  The lung volumes are slightly decreased relative to the prior exam.  Minimal right basilar airspace opacification is present.  No other airspace consolidation is present.  An NG tube is in place.  The visualized soft tissues and bony thorax are unremarkable apart from cholecystectomy clips.  IMPRESSION:  1.  Decreased lung volumes with minimal right basilar airspace disease.  While this likely reflects atelectasis, early infection is not excluded. 2.  Stable chronic interstitial coarsening.  Original Report Authenticated By: Marin Roberts, M.D.    Dg Abd 2 Views  02/12/2012  *RADIOLOGY REPORT*  Clinical Data: Small bowel obstruction, follow-up.  ABDOMEN - 2 VIEW  Comparison: 02/11/2012  Findings: Dilated loops of small bowel with air-fluid levels are consistent with a high grade partial small bowel obstruction.  Air and stool are seen within a normal caliber colon.  Bowel anastomosis staples project in the left lower quadrant and surgical vascular clips project in the right upper quadrant reflecting a prior cholecystectomy.  There is opacity at the right lung base that may reflect atelectasis or infiltrate.  No free air.  A nasogastric tube has its tip in the proximal  stomach, which has been inserted further since the prior study.  IMPRESSION: High grade partial small bowel obstruction similar to the previous day's study.  No free air.  Nasogastric tube now has its tip within the stomach.   Original Report Authenticated By: Amie Portland, M.D.    Dg Abd Portable 1v  02/11/2012  *RADIOLOGY REPORT*  Clinical Data: Nasogastric tube placement at bedside.  PORTABLE ABDOMEN - 1 VIEW 02/11/2012 1046 hours:  Comparison: Two-view abdomen x-ray earlier same date 0606 hours.  Findings: Nasogastric tube tip in the distal esophagus near the esophagogastric junction.  This should be advanced several centimeters.  Multiple dilated loops of small bowel throughout the abdomen, unchanged.  No suggestion of free air on the supine image.  IMPRESSION:  1.  Nasogastric tube tip projects over the distal esophagus just above the esophagogastric junction.  This should be advanced several centimeters. 2.  Small bowel obstruction.  These results were called by telephone on 02/11/2012 at 1102 hours to Lauren, the nurse caring for the patient, who verbally acknowledged these results.   Original Report Authenticated By: Hulan Saas, M.D.     Anti-infectives: Anti-infectives     Start     Dose/Rate Route Frequency Ordered Stop   02/13/12 0600   cefOXitin (MEFOXIN) 2 g in dextrose 5 % 50 mL IVPB        2 g 100 mL/hr over 30 Minutes Intravenous 4 times per day 02/13/12 0414 02/14/12 0559   02/12/12 0900   cefOXitin (MEFOXIN) 2 g in dextrose 5 % 50 mL IVPB     Comments: On call to OR      2 g 100 mL/hr over 30 Minutes Intravenous  Once 02/12/12 0853 02/12/12 1345           Assessment/Plan  1. PSBO, s/p ex lap with LOA 2. Post op ileus 3. HIV  Plan: 1. Cont bowel rest and NGT.  Await bowel function 2. Dc foley in am 3. Mobilize and pulm toilet   LOS: 5 days    Crystelle Ferrufino E 02/13/2012, 9:56 AM Pager: [phone]

## 2012-02-13 NOTE — Progress Notes (Signed)
Internal Medicine Teaching Service Attending Note Date: 02/13/2012  Patient name: Jessica [name] [facility]  Medical record number: [id number]  Date of birth: [birth year]    This patient has been seen and discussed with the house staff. Please see their note for complete details. I concur with their findings with the following additions/corrections: Patient was seen at the bedside. She denied any pain at this time to me. She was mentating well and was able to explain what was done at surgery. Patient had an episode of fever up to 102.2 and had blood cultures and urine cultures drawn for that reason. We will continue to monitor patient's vitals and need for antibiotics. Fever can be explained secondary to acute surgical stress but needs closer monitoring as surgical wound infection is also a possibility. No antibiotics at this time. Rest of the medical management as per resident's note.  Lars Mage 02/13/2012, 5:51 PM

## 2012-02-13 NOTE — Progress Notes (Signed)
PARENTERAL NUTRITION CONSULT NOTE - FOLLOW UP  Pharmacy Consult for TPN Indication: PSBO  No Known Allergies  Patient Measurements: Height: 5\' 7"  (170.2 cm) Weight: 125 lb 7.1 oz (56.9 kg) IBW/kg (Calculated) : 61.6   Vital Signs: Temp: 99.7 F (37.6 C) (12/03 0600) Temp src: Oral (12/03 0242) BP: 127/71 mmHg (12/03 0600) Pulse Rate: 109  (12/03 0600) Intake/Output from previous day: 12/02 0701 - 12/03 0700 In: 3462.8 [I.V.:2472; NG/GT:60; IV Piggyback:300; TPN:630.8] Out: 3070 [Urine:2320; Emesis/NG output:750] Intake/Output from this shift:    Labs:  [facility] 02/13/12 0500 02/12/12 1907  WBC 5.2 5.1  HGB 14.4 13.4  HCT 40.3 37.7  PLT 305 268  APTT -- --  INR -- --     Basename 02/13/12 0500 02/12/12 1907 02/12/12 1429 02/12/12 0723 02/11/12 0625  NA 133* -- -- 138 138  K 3.1* -- -- 3.7 3.4*  CL 104 -- -- 102 104  CO2 19 -- -- 17* 18*  GLUCOSE 233* -- 121* 58* --  BUN 7 -- -- 6 10  CREATININE 0.49* 0.42* -- 0.46* --  LABCREA -- -- -- -- --  CREAT24HRUR -- -- -- -- --  CALCIUM 8.6 -- -- 9.5 9.0  MG 1.4* -- -- 1.6 --  PHOS 1.6* -- -- -- --  PROT 5.8* -- -- -- --  ALBUMIN 2.6* -- -- -- --  AST 18 -- -- -- --  ALT 9 -- -- -- --  ALKPHOS 69 -- -- -- --  BILITOT 0.2* -- -- -- --  BILIDIR -- -- -- -- --  IBILI -- -- -- -- --  PREALBUMIN -- -- -- -- --  TRIG 51 -- -- -- --  CHOLHDL -- -- -- -- --  CHOL 86 -- -- -- --   Estimated Creatinine Clearance: 59.6 ml/min (by C-G formula based on Cr of 0.49).    Basename 02/13/12 0747 02/13/12 0338 02/12/12 2335  GLUCAP 199* 232* 269*   Insulin Requirements in the past 24 hours:  None administered as SSI ordered TIDWC instead of Q4h  Current Nutrition:  NPO + Clinimix E 5/15 at 57ml/hr  Nutritional Goals per RD assessment:  1650-1775 kCal, 75-90 grams of protein per day, 1.5-1.8 L/day  Assessment:  Admit: admitted 11/28 with abd pain, bilious emesis and no flatus/BM x 2 days PTA. Now s/p exp lap and LOA  (12/2) for CT with SBO likely d/t adhesions.  GI: Nausea ongoing. NGT in place with out. Hx of prior abd surgeries including SBO with SBR. Was NPO x 5 days, appears to be refeeding with initiation of TPN calories. Appreciate RD assessment of nutritional needs.  Endo: No A1c or TSH check this admit. No prior hx DM. CBGs now in the 200s, SSI not given since TPN start.  Lytes: Hypokalemia, hypophosphatemia and hypomagnesemia noted- depicts refeeding syndrome. Na low likely d/t fluid overload + MIVF containing D5W. With ileus, would aim for K ~4 and Mg ~2. Generally aim for Phos ~3. Noted MIVF is D545NS at 55cc/hr.  Renal: Scr nml and stable, UOP 1.7 ml/kg/hr Pulm: 2L , sats 98%, stable.  Cards: Hx HTN, HLD. Home meds not resumed, BP elevated.  Hepatobil: LFTs nml at BL. Triglycerides nml, Prealbumin pending.  ID: Febrile, WBC nml. Periop mefoxitin now continues, scheduled. Hx of HIV, meds not resumed d.t NPO status, CD4 1210 and viral load <40 in 06/2011.  Best Practices: LMWH  TPN Access: PICC 02/12/2012  TPN day#: 12/2>>  Plan:  -  Continue TPN with Clinimix E 5/15 at 40 cc/hr, will not advance to goal rate of 70 cc/hr until lytes and glycemia are at goal. - Will supplement MVI, trace elements and IV fats on MWF due to ongoing national shortages.  - Will change MIVF to NS at 55cc/hr now, will give of Kphos ( K) and 4 gm of IV Mg now. Will also give 4 runs of KCL today.  - Will change SSI/CBG checks to q4h- requested that RN start supplementing ASAP. Will hold off on adding insulin to TPN for now.  - Will add thiamine and folate x 3 days  - Will f/up BMET, Mg, Phos and Prealbumin in AM  Thanks, Christphor Groft K. Allena Katz, PharmD, BCPS.  Clinical Pharmacist Pager [phone]. 02/13/2012 8:45 AM

## 2012-02-13 NOTE — Progress Notes (Signed)
No flatus yet, bilious NGT output.

## 2012-02-14 DIAGNOSIS — E46 Unspecified protein-calorie malnutrition: Secondary | ICD-10-CM

## 2012-02-14 LAB — BASIC METABOLIC PANEL
CO2: 22 mEq/L (ref 19–32)
Calcium: 8.7 mg/dL (ref 8.4–10.5)
Chloride: 102 mEq/L (ref 96–112)
Creatinine, Ser: 0.46 mg/dL — ABNORMAL LOW (ref 0.50–1.10)
GFR calc Af Amer: >90 mL/min (ref >90)
GFR calc non Af Amer: >90 mL/min (ref >90)
Glucose, Bld: 134 mg/dL — ABNORMAL HIGH (ref 70–99)
Potassium: 3.3 mEq/L — ABNORMAL LOW (ref 3.5–5.1)
Sodium: 136 mEq/L (ref 135–145)

## 2012-02-14 LAB — MAGNESIUM
Magnesium: 2.1 mg/dL (ref 1.5–2.5)
Magnesium: 1.8 mg/dL (ref 1.5–2.5)

## 2012-02-14 LAB — GLUCOSE, CAPILLARY
Glucose-Capillary: 116 mg/dL — ABNORMAL HIGH (ref 70–99)
Glucose-Capillary: 128 mg/dL — ABNORMAL HIGH (ref 70–99)
Glucose-Capillary: 131 mg/dL — ABNORMAL HIGH (ref 70–99)
Glucose-Capillary: 132 mg/dL — ABNORMAL HIGH (ref 70–99)

## 2012-02-14 LAB — URINE CULTURE

## 2012-02-14 LAB — PHOSPHORUS: Phosphorus: 2.4 mg/dL (ref 2.3–4.6)

## 2012-02-14 MED ORDER — POTASSIUM PHOSPHATE DIBASIC 3 MMOLE/ML IV SOLN
10.0000 mmol | Freq: Once | INTRAVENOUS | Status: AC
  Administered 2012-02-14: 10 mmol via INTRAVENOUS
  Filled 2012-02-14: qty 3.33

## 2012-02-14 MED ORDER — MAGNESIUM SULFATE 40 MG/ML IJ SOLN
2.0000 g | Freq: Once | INTRAMUSCULAR | Status: AC
  Administered 2012-02-14: 2 g via INTRAVENOUS
  Filled 2012-02-14: qty 50

## 2012-02-14 MED ORDER — PHENOL 1.4 % MT LIQD
1.0000 | OROMUCOSAL | Status: DC | PRN
  Filled 2012-02-14: qty 177

## 2012-02-14 MED ORDER — HYDROMORPHONE HCL PF 1 MG/ML IJ SOLN
0.5000 mg | INTRAMUSCULAR | Status: DC
  Administered 2012-02-14 – 2012-02-15 (×7): 0.5 mg via INTRAVENOUS
  Filled 2012-02-14 (×7): qty 1

## 2012-02-14 MED ORDER — POTASSIUM CHLORIDE 10 MEQ/50ML IV SOLN
10.0000 meq | INTRAVENOUS | Status: AC
  Administered 2012-02-14 (×4): 10 meq via INTRAVENOUS
  Filled 2012-02-14 (×5): qty 50

## 2012-02-14 MED ORDER — HYDROMORPHONE 0.3 MG/ML IV SOLN
INTRAVENOUS | Status: DC
  Administered 2012-02-14: 0.99 mg via INTRAVENOUS
  Administered 2012-02-14: 0.5 mg via INTRAVENOUS
  Administered 2012-02-15: 1 mg via INTRAVENOUS
  Administered 2012-02-15: 0.5 mg via INTRAVENOUS

## 2012-02-14 MED ORDER — POTASSIUM CHLORIDE 10 MEQ/50ML IV SOLN
10.0000 meq | Freq: Once | INTRAVENOUS | Status: AC
  Administered 2012-02-14: 10 meq via INTRAVENOUS

## 2012-02-14 MED ORDER — FAT EMULSION 20 % IV EMUL
240.0000 mL | INTRAVENOUS | Status: AC
  Administered 2012-02-14: 240 mL via INTRAVENOUS
  Filled 2012-02-14: qty 250

## 2012-02-14 MED ORDER — TRACE MINERALS CR-CU-F-FE-I-MN-MO-SE-ZN IV SOLN
INTRAVENOUS | Status: AC
  Administered 2012-02-14: 18:00:00 via INTRAVENOUS
  Filled 2012-02-14: qty 2000

## 2012-02-14 NOTE — Progress Notes (Signed)
Patient ID: Jessica [name], female   DOB: [birth year], 71 y.o.   MRN: [id number] 2 Days Post-Op  Subjective: Pt feels better today.  Eager to get up and move around.  No flatus, but belly "growling"  Objective: Vital signs in last 24 hours: Temp:  [98.4 F (36.9 C)-99.4 F (37.4 C)] 99 F (37.2 C) (12/04 0609) Pulse Rate:  [94-103] 96  (12/04 0609) Resp:  [12-20] 18  (12/04 0609) BP: (119-181)/(58-73) 161/58 mmHg (12/04 0649) SpO2:  [95 %-100 %] 98 % (12/04 0609) Last BM Date: 02/06/12  Intake/Output from previous day: 12/03 0701 - 12/04 0700 In: 1881 [I.V.:753.8; IV Piggyback:510; TPN:617.2] Out: 2280 [Urine:1980; Emesis/NG output:300] Intake/Output this shift:    PE: Abd: soft, less tender, covaderm in place and dry, +BS, NGT only 300c/24h perior  Lab Results:   Basename 02/13/12 0500 02/12/12 1907  WBC 5.2 5.1  HGB 14.4 13.4  HCT 40.3 37.7  PLT 305 268   BMET  Basename 02/14/12 0755 02/14/12 0500  NA 136 130*  K 3.3* 4.2  CL 107 102  CO2 22 21  GLUCOSE 134* 468*  BUN 10 10  CREATININE 0.50 0.46*  CALCIUM 8.6 8.7   PT/INR No results found for this basename: LABPROT:2,INR:2 in the last 72 hours CMP     Component Value Date/Time   NA 136 02/14/2012 0755   K 3.3* 02/14/2012 0755   CL 107 02/14/2012 0755   CO2 22 02/14/2012 0755   GLUCOSE 134* 02/14/2012 0755   BUN 10 02/14/2012 0755   CREATININE 0.50 02/14/2012 0755   CREATININE 0.62 10/20/2011 1006   CALCIUM 8.6 02/14/2012 0755   PROT 5.8* 02/13/2012 0500   ALBUMIN 2.6* 02/13/2012 0500   AST 18 02/13/2012 0500   ALT 9 02/13/2012 0500   ALKPHOS 69 02/13/2012 0500   BILITOT 0.2* 02/13/2012 0500   GFRNONAA >90 02/14/2012 0755   GFRAA >90 02/14/2012 0755   Lipase     Component Value Date/Time   LIPASE 36 02/08/2012 0508       Studies/Results: Dg Chest Port 1 View  02/13/2012  *RADIOLOGY REPORT*  Clinical Data: Postop fever, weakness  PORTABLE CHEST - 1 VIEW  Comparison: Portable chest x-ray of  02/12/2012  Findings: There is mild bibasilar linear atelectasis present.  No focal infiltrate or effusion is seen. A right PICC line is present with the tip overlying the mid upper SVC.  No pneumothorax is noted.  An NG tube extends below the hemidiaphragm.  IMPRESSION:  1.  Right PICC line tip overlies upper SVC.  No pneumothorax. 2.  Mild bibasilar linear atelectasis.   Original Report Authenticated By: Dwyane Dee, M.D.    Dg Chest Port 1 View  02/12/2012  *RADIOLOGY REPORT*  Clinical Data: Preop for exploratory laparotomy.  Left-sided chest pain.  PORTABLE CHEST - 1 VIEW  Comparison: Two-view chest 02/03/2012.  Findings: The heart size is normal.  Mild interstitial coarsening is chronic.  The lung volumes are slightly decreased relative to the prior exam.  Minimal right basilar airspace opacification is present.  No other airspace consolidation is present.  An NG tube is in place.  The visualized soft tissues and bony thorax are unremarkable apart from cholecystectomy clips.  IMPRESSION:  1.  Decreased lung volumes with minimal right basilar airspace disease.  While this likely reflects atelectasis, early infection is not excluded. 2.  Stable chronic interstitial coarsening.   Original Report Authenticated By: Marin Roberts, M.D.     Anti-infectives: Anti-infectives  Start     Dose/Rate Route Frequency Ordered Stop   02/13/12 0600   cefOXitin (MEFOXIN) 2 g in dextrose 5 % 50 mL IVPB  Status:  Discontinued        2 g 100 mL/hr over 30 Minutes Intravenous 4 times per day 02/13/12 0414 02/13/12 1312   02/12/12 0900   cefOXitin (MEFOXIN) 2 g in dextrose 5 % 50 mL IVPB     Comments: On call to OR      2 g 100 mL/hr over 30 Minutes Intravenous  Once 02/12/12 0853 02/12/12 1345           Assessment/Plan  1. S/p ex lap with LOA 2. Post op ileus, improving 3. PCM/TNA  Plan: 1. Will try clamping trials with her NGT today given how good her BS are and decrease in NGT output 2.  Mobilize and cont to pulm toilet  3. Cont TNA, will start to wean once tolerating some diet  LOS: 6 days    Akela Pocius E 02/14/2012, 8:49 AM Pager: [phone]

## 2012-02-14 NOTE — Progress Notes (Signed)
Agree with above  Clamp NGT. Hope to d/c later today.

## 2012-02-14 NOTE — Progress Notes (Signed)
PARENTERAL NUTRITION CONSULT NOTE - FOLLOW UP  Pharmacy Consult for TPN Indication: PSBO  No Known Allergies  Patient Measurements: Height: 5\' 7"  (170.2 cm) Weight: 125 lb 7.1 oz (56.9 kg) IBW/kg (Calculated) : 61.6   Vital Signs: Temp: 99 F (37.2 C) (12/04 0609) Temp src: Oral (12/04 0609) BP: 161/58 mmHg (12/04 0649) Pulse Rate: 96  (12/04 0609) Intake/Output from previous day: 12/03 0701 - 12/04 0700 In: 1881 [I.V.:753.8; IV Piggyback:510; TPN:617.2] Out: 2280 [Urine:1980; Emesis/NG output:300] Intake/Output from this shift:    Labs:  [facility] 02/13/12 0500 02/12/12 1907  WBC 5.2 5.1  HGB 14.4 13.4  HCT 40.3 37.7  PLT 305 268  APTT -- --  INR -- --     Basename 02/14/12 0755 02/14/12 0500 02/13/12 0500  NA 136 130* 133*  K 3.3* 4.2 3.1*  CL 107 102 104  CO2 22 21 19   GLUCOSE 134* 468* 233*  BUN 10 10 7   CREATININE 0.50 0.46* 0.49*  LABCREA -- -- --  CREAT24HRUR -- -- --  CALCIUM 8.6 8.7 8.6  MG 1.8 2.1 1.4*  PHOS 2.4 4.0 1.6*  PROT -- -- 5.8*  ALBUMIN -- -- 2.6*  AST -- -- 18  ALT -- -- 9  ALKPHOS -- -- 69  BILITOT -- -- 0.2*  BILIDIR -- -- --  IBILI -- -- --  PREALBUMIN -- -- 11.1*  TRIG -- -- 51  CHOLHDL -- -- --  CHOL -- -- 86   Estimated Creatinine Clearance: 59.6 ml/min (by C-G formula based on Cr of 0.5).    Basename 02/14/12 0822 02/14/12 0352 02/13/12 2345  GLUCAP 116* 128* 132*   Insulin Requirements in the past 24 hours:  3 units SSI since 1800 12/3  Current Nutrition:  NPO + Clinimix E 5/15 at 56ml/hr  Nutritional Goals per RD assessment:  1650-1775 kCal, 75-90 grams of protein per day, 1.5-1.8 L/day  Assessment:  Admit: admitted 11/28 with abd pain, bilious emesis and no flatus/BM x 2 days PTA. Now s/p exp lap and LOA (12/2) for CT with SBO likely d/t adhesions.  GI: Nausea ongoing. NGT in place with 300 ml out. Hx of prior abd surgeries including SBO with SBR. Was NPO x 5 days, appears to be refeeding with initiation of  TPN calories. Appreciate RD assessment of nutritional needs.  Endo: No A1c or TSH check this admit. No prior hx DM. CBG controlled with minimal SSI. Lytes: Hypokalemia noted, Mg and Phos better. With ileus, would aim for K ~4 and Mg ~2. Generally aim for Phos ~3. Noted MIVF is NS at 55cc/hr.  Renal: Scr nml and stable, UOP 1.4 ml/kg/hr Pulm: 2L [place], sats 98%, stable.  Neuro: Folate and Thiamine IV through 12/5 to prevent Wernicke's.  Cards: Hx HTN, HLD. Home meds not resumed, BP elevated.  Hepatobil: LFTs nml at BL. Triglycerides nml, Prealbumin low for goal 18-40 (11.1).  ID: Febrile, WBC nml. Off abx. Hx of HIV, meds not resumed d.t NPO status, CD4 1210 and viral load <40 in 06/2011.  Best Practices: SQ hep TPN Access: PICC 02/12/2012  TPN day#: 12/2>>  Plan:  - Increase Clinimix E 5/15 to 50 cc/hr, plan to slowly advance to goal rate of 70 cc/hr based on lytes/glycemic control. - Will supplement MVI, trace elements and IV fats on MWF due to ongoing Publishing copy.  - Will give of Kphos ( K), 5 runs of IV KCL/55ml and 2 gm of IV Mg now. - Will continue SSI/CBG  for now. Will hold off on adding insulin to TPN for now.  - Will f/up CMET, Mg, Phos in AM  Thanks, Keesha Pellum K. Allena Katz, PharmD, BCPS.  Clinical Pharmacist Pager [phone]. 02/14/2012 8:44 AM

## 2012-02-14 NOTE — Progress Notes (Signed)
Subjective: Jessica [name] was seen and examined while lying in bed.  She is post op day #2 s/p ex lap with LOA for PSBO.  She is still complaining of abdominal pain but claims her pain medication is not working enough.  NGT still in place draining 300cc/24 hours.  She denies passing any gas and no BM since surgery.  Otherwise, she has no other complaints.  She denies any nausea, vomiting, fever, chills, chest pain, shortness of breath at this time.   Objective: Vital signs in last 24 hours: Filed Vitals:   02/14/12 0609 02/14/12 0649 02/14/12 0902 02/14/12 1023  BP: 181/65 161/58  137/66  Pulse: 96   95  Temp: 99 F (37.2 C)   98.4 F (36.9 C)  TempSrc: Oral   Oral  Resp: 18  18 13   Height:      Weight:      SpO2: 98%  97% 98%   Weight change:   Intake/Output Summary (Last 24 hours) at 02/14/12 1127 Last data filed at 02/14/12 2956  Gross per 24 hour  Intake 1880.99 ml  Output   2280 ml  Net -399.01 ml   General: sitting in chair, tearful HEENT: PERRLA, EOMI, NGT in place Cardiac: RRR, no rubs, murmurs or gallops Pulm: clear to auscultation bilaterally, -wheezing Abd: soft, midline dressing clean and dry in place, soft, + bowel sounds.   Ext: warm and well perfused, no pedal edema, +2dp b/l Neuro: alert and oriented X3, cranial nerves II-XII grossly intact  Lab Results: Basic Metabolic Panel:  Lab 02/14/12 2130 02/14/12 0500  NA 136 130*  K 3.3* 4.2  CL 107 102  CO2 22 21  GLUCOSE 134* 468*  BUN 10 10  CREATININE 0.50 0.46*  CALCIUM 8.6 8.7  MG 1.8 2.1  PHOS 2.4 4.0   Liver Function Tests:  Lab 02/13/12 0500 02/08/12 0508  AST 18 18  ALT 9 10  ALKPHOS 69 117  BILITOT 0.2* 0.3  PROT 5.8* 8.2  ALBUMIN 2.6* 3.9    Lab 02/08/12 0508  LIPASE 36  AMYLASE --   CBC:  Lab 02/13/12 0500 02/12/12 1907  WBC 5.2 5.1  NEUTROABS 3.7 --  HGB 14.4 13.4  HCT 40.3 37.7  MCV 92.9 94.0  PLT 305 268   Cardiac Enzymes:  Lab 02/08/12 1259 02/08/12 0812   CKTOTAL -- 59  CKMB -- 6.4*  CKMBINDEX -- --  TROPONINI <0.30 --   Coagulation:  Lab 02/08/12 0811  LABPROT 12.8  INR 0.97   Urinalysis:  Lab 02/13/12 0340 02/08/12 1316  COLORURINE YELLOW YELLOW  LABSPEC 1.012 1.015  PHURINE 7.0 7.0  GLUCOSEU 100* NEGATIVE  HGBUR SMALL* TRACE*  BILIRUBINUR NEGATIVE NEGATIVE  KETONESUR NEGATIVE 15*  PROTEINUR NEGATIVE NEGATIVE  UROBILINOGEN 0.2 0.2  NITRITE NEGATIVE NEGATIVE  LEUKOCYTESUR NEGATIVE NEGATIVE   Studies/Results: Dg Chest Port 1 View  02/13/2012  *RADIOLOGY REPORT*  Clinical Data: Postop fever, weakness  PORTABLE CHEST - 1 VIEW  Comparison: Portable chest x-ray of 02/12/2012  Findings: There is mild bibasilar linear atelectasis present.  No focal infiltrate or effusion is seen. A right PICC line is present with the tip overlying the mid upper SVC.  No pneumothorax is noted.  An NG tube extends below the hemidiaphragm.  IMPRESSION:  1.  Right PICC line tip overlies upper SVC.  No pneumothorax. 2.  Mild bibasilar linear atelectasis.   Original Report Authenticated By: Dwyane Dee, M.D.    Medications: I have reviewed the patient's current  medications. Scheduled Meds:    . antiseptic oral rinse  15 mL Mouth Rinse q12n4p  . chlorhexidine  15 mL Mouth Rinse BID  . fentaNYL  50 mcg Transdermal Q72H  . folic acid  1 mg Intravenous Daily  . heparin  5,000 Units Subcutaneous Q8H  .  HYDROmorphone (DILAUDID) injection  0.5 mg Intravenous Q4H  . HYDROmorphone PCA 0.3 mg/mL   Intravenous Q4H  . insulin aspart  0-9 Units Subcutaneous Q4H  . [COMPLETED] magnesium sulfate 1 - 4 g bolus IVPB  2 g Intravenous Once  . [COMPLETED] magnesium sulfate 1 - 4 g bolus IVPB  4 g Intravenous Once  . nicotine  21 mg Transdermal Daily  . [COMPLETED] potassium chloride  10 mEq Intravenous Q1 Hr x 4  . potassium chloride  10 mEq Intravenous Q1 Hr x 5  . potassium phosphate IVPB (mmol)  10 mmol Intravenous Once  . [COMPLETED] potassium phosphate IVPB  (mmol)  30 mmol Intravenous Once  . thiamine  100 mg Intravenous Daily  . [DISCONTINUED] cefOXitin  2 g Intravenous Q6H   Continuous Infusions:    . sodium chloride 55 mL/hr at 02/14/12 0907  . [EXPIRED] TPN (CLINIMIX) +/- additives 40 mL/hr at 02/12/12 1723   And  . [EXPIRED] fat emulsion 240 mL (02/12/12 1723)  . TPN (CLINIMIX) +/- additives     And  . fat emulsion    . TPN (CLINIMIX) +/- additives 40 mL/hr at 02/13/12 2036   PRN Meds:.diphenhydrAMINE, diphenhydrAMINE, diphenhydrAMINE, hydrALAZINE, naloxone, ondansetron (ZOFRAN) IV, sodium chloride, sodium chloride, [DISCONTINUED]  HYDROmorphone (DILAUDID) injection Assessment/Plan: 71 yo woman with h/o HIV, HTN and previous abdominal surgeries was admitted on 02/08/12 for  # Partial small bowel obstruction, symptomatic: h/o of multiple abdominal surgeries and is s/p small bowel resection 10/12; CT scan revealed mild distal small bowel obstruction with transition in the mid pelvis likely due to adhesions.  Last colonoscopy 5/12 with extensive left sided diverticula. S/p ex lap with LOA yesterday--POD #2 - Surgery following--continue bowl rest and NGT, awaiting bowel function, mobilize and pulmonary toilet - NGT in place, output 300cc output/24 hours--clamped, may d/c later today as per surgery - Continue bowel rest with NGT - IVF - Continue TPN - Continue pain mgmt: Dilaudid & fentanyl patch - Continue nausea mgmt with zofran q6h prn - requiring med QID  # Post operative fever 02/13/12: Resolved. Tmax 102.24F around 3am.  Given IV tylenol 1000mg  x1 once. Afebrile at this time. No leukocytosis.  -u/a negative for nitrities and leukocytes, wbc 0-2,  -f/u blood cx x2--NGTD -continue to monitor  #Electrolyte abnormalities: likely related to NG output; K 3.3, Mg 1.8, Phos 2.4.  Hyponatremia this morning Na 130-->136 -replenished -continue to monitor  -on TNA  # HIV disease--well controlled: CD4 = 680 & HIV RNA Quant <20 02/08/12.   Followed by Dr. 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 Brunilda Payor with Urology.   # Tobacco abuse: treatment with nicotine patch during hospitalization   Diet: TPN via PICC  DVT PPX: [facility] Lovenox Dispo: Disposition is deferred at this time, awaiting clinical improvement.  .  The patient does not have a current PCP [name], MD), therefore will be requiring OPC follow-up after discharge.  The patient  does not have transportation limitations that hinder transportation to clinic appointments.   LOS: 6 days   Darden Palmer 02/14/2012, 11:27 AM

## 2012-02-14 NOTE — Progress Notes (Signed)
Internal Medicine Teaching Service Attending Note Date: 02/14/2012  Patient name: Jessica [name]  Medical record number: [id number]  Date of birth: 1940-09-21    This patient has been seen and discussed with the house staff. Please see their note for complete details. I concur with their findings with the following additions/corrections: Patient complains of inadequate pain control with current pain regimen. Patient has not passed any flatus. She otherwise feels good and had good sleep last night. We will modify her pain regimen. We will follow surgery recommendations regarding managing her NG tube and dietary status. Patient is hyponatremic and adjustments have been made to TNA to accommodate for the deficit.  Lars Mage 02/14/2012, 11:03 AM

## 2012-02-15 LAB — COMPREHENSIVE METABOLIC PANEL
ALT: 8 U/L (ref 0–35)
Alkaline Phosphatase: 80 U/L (ref 39–117)
BUN: 8 mg/dL (ref 6–23)
CO2: 24 mEq/L (ref 19–32)
Calcium: 8.8 mg/dL (ref 8.4–10.5)
GFR calc Af Amer: >90 mL/min (ref >90)
GFR calc non Af Amer: >90 mL/min (ref >90)
Glucose, Bld: 122 mg/dL — ABNORMAL HIGH (ref 70–99)
Potassium: 3.3 mEq/L — ABNORMAL LOW (ref 3.5–5.1)
Total Protein: 5.9 g/dL — ABNORMAL LOW (ref 6.0–8.3)

## 2012-02-15 LAB — GLUCOSE, CAPILLARY
Glucose-Capillary: 116 mg/dL — ABNORMAL HIGH (ref 70–99)
Glucose-Capillary: 117 mg/dL — ABNORMAL HIGH (ref 70–99)
Glucose-Capillary: 120 mg/dL — ABNORMAL HIGH (ref 70–99)
Glucose-Capillary: 150 mg/dL — ABNORMAL HIGH (ref 70–99)

## 2012-02-15 LAB — MAGNESIUM: Magnesium: 1.7 mg/dL (ref 1.5–2.5)

## 2012-02-15 MED ORDER — POTASSIUM PHOSPHATE DIBASIC 3 MMOLE/ML IV SOLN
10.0000 mmol | Freq: Once | INTRAVENOUS | Status: AC
  Administered 2012-02-15: 10 mmol via INTRAVENOUS
  Filled 2012-02-15: qty 3.33

## 2012-02-15 MED ORDER — CLINIMIX E/DEXTROSE (5/15) 5 % IV SOLN
INTRAVENOUS | Status: DC
  Administered 2012-02-15: 17:00:00 via INTRAVENOUS
  Filled 2012-02-15: qty 2000

## 2012-02-15 MED ORDER — MAGNESIUM SULFATE 40 MG/ML IJ SOLN
2.0000 g | Freq: Once | INTRAMUSCULAR | Status: AC
  Administered 2012-02-15: 2 g via INTRAVENOUS
  Filled 2012-02-15: qty 50

## 2012-02-15 MED ORDER — POTASSIUM CHLORIDE 10 MEQ/50ML IV SOLN
10.0000 meq | INTRAVENOUS | Status: AC
  Administered 2012-02-15 (×5): 10 meq via INTRAVENOUS
  Filled 2012-02-15 (×5): qty 50

## 2012-02-15 MED ORDER — BOOST / RESOURCE BREEZE PO LIQD
1.0000 | Freq: Two times a day (BID) | ORAL | Status: DC
  Administered 2012-02-16 – 2012-02-19 (×5): 1 via ORAL

## 2012-02-15 MED ORDER — HYDROMORPHONE HCL PF 1 MG/ML IJ SOLN
0.5000 mg | INTRAMUSCULAR | Status: DC | PRN
  Administered 2012-02-15: 2 mg via INTRAVENOUS
  Administered 2012-02-15: 1 mg via INTRAVENOUS
  Administered 2012-02-15 (×2): 2 mg via INTRAVENOUS
  Administered 2012-02-15: 1 mg via INTRAVENOUS
  Administered 2012-02-16 – 2012-02-18 (×23): 2 mg via INTRAVENOUS
  Filled 2012-02-15 (×14): qty 2
  Filled 2012-02-15: qty 1
  Filled 2012-02-15 (×5): qty 2
  Filled 2012-02-15: qty 1
  Filled 2012-02-15 (×7): qty 2

## 2012-02-15 NOTE — Progress Notes (Addendum)
PARENTERAL NUTRITION CONSULT NOTE - FOLLOW UP  Pharmacy Consult:  TPN Indication: pSBO  No Known Allergies  Patient Measurements: Height: 5\' 7"  (170.2 cm) Weight: 125 lb 7.1 oz (56.9 kg) IBW/kg (Calculated) : 61.6   Vital Signs: Temp: 99.2 F (37.3 C) (12/05 0518) Temp src: Oral (12/05 0518) BP: 175/63 mmHg (12/05 0518) Pulse Rate: 82  (12/05 0518) Intake/Output from previous day: 12/04 0701 - 12/05 0700 In: 2193 [I.V.:1100; IV Piggyback:300; TPN:793] Out: 1150 [Urine:800; Emesis/NG output:350]  Labs:  [facility] 02/13/12 0500 02/12/12 1907  WBC 5.2 5.1  HGB 14.4 13.4  HCT 40.3 37.7  PLT 305 268  APTT -- --  INR -- --     Basename 02/15/12 0545 02/14/12 0755 02/14/12 0500 02/13/12 0500  NA 138 136 130* --  K 3.3* 3.3* 4.2 --  CL 105 107 102 --  CO2 24 22 21  --  GLUCOSE 122* 134* 468* --  BUN 8 10 10  --  CREATININE 0.40* 0.50 0.46* --  LABCREA -- -- -- --  CREAT24HRUR -- -- -- --  CALCIUM 8.8 8.6 8.7 --  MG 1.7 1.8 2.1 --  PHOS 2.7 2.4 4.0 --  PROT 5.9* -- -- 5.8*  ALBUMIN 2.3* -- -- 2.6*  AST 13 -- -- 18  ALT 8 -- -- 9  ALKPHOS 80 -- -- 69  BILITOT 0.3 -- -- 0.2*  BILIDIR -- -- -- --  IBILI -- -- -- --  PREALBUMIN -- -- -- 11.1*  TRIG -- -- -- 51  CHOLHDL -- -- -- --  CHOL -- -- -- 86   Estimated Creatinine Clearance: 59.6 ml/min (by C-G formula based on Cr of 0.4).    Basename 02/14/12 2354 02/14/12 2018 02/14/12 1604  GLUCAP 132* 129* 119*     Insulin Requirements in the past 24 hours:  2 units SSI since 1800 last night  Current Nutrition:  Clinimix E 5/15   Assessment:  Admit: admitted 11/28 with abd pain, bilious emesis and no flatus/BM x 2 days PTA. Now s/p exp lap and LOA (12/2) for CT with SBO likely d/t adhesions.  GI: hx prior abd surgeries including SBO with SBR. Was NPO x 5 days, appears to be refeeding with initiation of TPN calories - NG O/P increased slightly ( ), no longer required Zofran use, planning to d/c NGT Endo: No  A1c or TSH this admit. No prior hx DM. CBG controlled with minimal SSI. Lytes: hypokalemia, phosphorus trending up post supplementation, magnesium decreasing despite supplementation, calcium normal at 10.16 Renal: SCr normal and stable, UOP decreasing, net positive 1L, NS at 55 ml/hr Pulm: 2L [place], sats 100% - stable Neuro: Folate and Thiamine IV through 12/5 to prevent Wernicke's.  Also on fentanyl patch and Dilaudid PCA, nicotine patch Cards: Hx HTN / HLD - BP elevated and trending up, HR normal - home med not yet resumed Hepatobil: LFTs / TG WNL.  Prealbumin low for goal 18-40 (11.1).  ID: Afebrile, WBC WNL. Off abx. Hx of HIV, meds not resumed d/t NPO status, CD4 1210 and viral load < 40 in 06/2011.  Best Practices: SQ heparin TPN Access: PICC 02/12/2012  TPN day#: 12/2 >>  Nutritional Goals per RD assessment:  1650-1775 kCal, 75-90 grams of protein per day, 1.5-1.8 L/day   Plan:  - Continue Clinimix E 5/15 at 50 mL/hr (goal 70 ml/hr).  Advance slowly d/t signs of refeeding syndrome. - Supplement multivitamins, trace elements and IV lipids on MWF due to ongoing Publishing copy.  -  KCL x 6 runs - Magnesium 2gm IV x 1 - KPhos 10 mmol IV x 1 - F/U labs in AM, vitals     Yuki Purves D. Laney Potash, PharmD, BCPS Pager:  [phone] 02/15/2012, 7:51 AM

## 2012-02-15 NOTE — Progress Notes (Signed)
Nutrition Follow-up  Intervention:   1. Re-weigh pt as able 2. Monitor magnesium, potassium, and phosphorus daily for at least 3 days, MD to replete as needed, as pt is at risk for refeeding syndrome given recent suboptimal PO intake. 3. TPN per pharmacy 4. Resource Breeze po BID, each supplement provides 250 kcal and 9 grams of protein. 5. RD to continue to follow nutrition care plan  DOCUMENTATION CODES  Per approved criteria   -Moderate malnutrition in the context of chronic illness    Assessment:   S/p lysis of adhesions on 12/3. NGT clamped yesterday, discontinued today. Pt advanced to clear liquids today - has yet to receive clear liquid tray.  Per TPN pharmacy note, TPN goal is 70 ml/hr. Pt has yet to advance to goal 2/2 signs of refeeding syndrome.  RD was able to obtain nutrition hx during this follow-up visit. Per patient, she reports that PTA, she did not eat anything for 4 days 2/2 poor PO intake. Also, she notes that for the past year, she has had ongoing poor intake, often relying on Ensure supplements to help meet nutritional needs.  She reports that she weighed approximately 150 lb last year prior to a previous surgery in October. Currently down to 125 lb, this is a weight change of 17% x 1 year. This weight loss is not significant. Pt with moderate visible muscle wasting in temporal region. Pt meets criteria for moderate MALNUTRITION in the context of chronic illness as evidenced by intake of <75% of estimated energy intake x at least 1 month and mild muscle mass loss.   Patient is receiving TPN with Clinimix E 5/15 @ 50 ml/hr.  Lipids (20% IVFE @ 10 ml/hr), multivitamins, and trace elements are provided 3 times weekly (MWF) due to national backorder.  Provides 1058 kcal and 60 grams protein daily (based on weekly average).  Meets 64% minimum estimated kcal and 80% minimum estimated protein needs.  Diet Order:  Clear Liquids  Meds: Scheduled Meds:   . antiseptic oral rinse   15 mL Mouth Rinse q12n4p  . chlorhexidine  15 mL Mouth Rinse BID  . fentaNYL  50 mcg Transdermal Q72H  . [COMPLETED] folic acid  1 mg Intravenous Daily  . heparin  5,000 Units Subcutaneous Q8H  .  HYDROmorphone (DILAUDID) injection  0.5 mg Intravenous Q4H  . insulin aspart  0-9 Units Subcutaneous Q4H  . [COMPLETED] magnesium sulfate 1 - 4 g bolus IVPB  2 g Intravenous Once  . magnesium sulfate 1 - 4 g bolus IVPB  2 g Intravenous Once  . nicotine  21 mg Transdermal Daily  . [EXPIRED] potassium chloride  10 mEq Intravenous Q1 Hr x 5  . [COMPLETED] potassium chloride  10 mEq Intravenous Once  . potassium chloride  10 mEq Intravenous Q1 Hr x 5  . [COMPLETED] potassium phosphate IVPB (mmol)  10 mmol Intravenous Once  . potassium phosphate IVPB (mmol)  10 mmol Intravenous Once  . [COMPLETED] thiamine  100 mg Intravenous Daily  . [DISCONTINUED] HYDROmorphone PCA 0.3 mg/mL   Intravenous Q4H  . [DISCONTINUED] HYDROmorphone PCA 0.3 mg/mL   Intravenous Q4H   Continuous Infusions:   . sodium chloride 55 mL/hr at 02/15/12 1038  . TPN (CLINIMIX) +/- additives 50 mL/hr at 02/14/12 1747   And  . fat emulsion 240 mL (02/14/12 1747)  . [EXPIRED] TPN (CLINIMIX) +/- additives 40 mL/hr at 02/13/12 2036  . TPN (CLINIMIX) +/- additives     PRN Meds:.diphenhydrAMINE, diphenhydrAMINE, diphenhydrAMINE,  hydrALAZINE, naloxone, ondansetron (ZOFRAN) IV, phenol, sodium chloride, sodium chloride   CMP     Component Value Date/Time   NA 138 02/15/2012 0545   K 3.3* 02/15/2012 0545   CL 105 02/15/2012 0545   CO2 24 02/15/2012 0545   GLUCOSE 122* 02/15/2012 0545   BUN 8 02/15/2012 0545   CREATININE 0.40* 02/15/2012 0545   CREATININE 0.62 10/20/2011 1006   CALCIUM 8.8 02/15/2012 0545   PROT 5.9* 02/15/2012 0545   ALBUMIN 2.3* 02/15/2012 0545   AST 13 02/15/2012 0545   ALT 8 02/15/2012 0545   ALKPHOS 80 02/15/2012 0545   BILITOT 0.3 02/15/2012 0545   GFRNONAA >90 02/15/2012 0545   GFRAA >90 02/15/2012 0545    CBG  (last 3)   Basename 02/15/12 0800 02/15/12 0409 02/14/12 2354  GLUCAP 116* 120* 132*   Sodium  Date/Time Value Range Status  02/15/2012  5:45 AM 138  135 - 145 mEq/L Final  02/14/2012  7:55 AM 136  135 - 145 mEq/L Final  02/14/2012  5:00 AM 130* 135 - 145 mEq/L Final    Potassium  Date/Time Value Range Status  02/15/2012  5:45 AM 3.3* 3.5 - 5.1 mEq/L Final  02/14/2012  7:55 AM 3.3* 3.5 - 5.1 mEq/L Final  02/14/2012  5:00 AM 4.2  3.5 - 5.1 mEq/L Final     DELTA CHECK NOTED    Phosphorus  Date/Time Value Range Status  02/15/2012  5:45 AM 2.7  2.3 - 4.6 mg/dL Final  16/03/958  4:54 AM 2.4  2.3 - 4.6 mg/dL Final  11/18/1189  4:78 AM 4.0  2.3 - 4.6 mg/dL Final    Magnesium  Date/Time Value Range Status  02/15/2012  5:45 AM 1.7  1.5 - 2.5 mg/dL Final  29/07/6211  0:86 AM 1.8  1.5 - 2.5 mg/dL Final  57/10/4694  2:95 AM 2.1  1.5 - 2.5 mg/dL Final   Prealbumin  Date/Time Value Range Status  02/13/2012  5:00 AM 11.1* 17.0 - 34.0 mg/dL Final     Intake/Output Summary (Last 24 hours) at 02/15/12 1104 Last data filed at 02/15/12 0600  Gross per 24 hour  Intake   2093 ml  Output   1100 ml  Net    993 ml  BM 11/26   Weight Status:  125 lb - no new wt since 11/30  Estimated needs:  1650 - 1775 kcal, 75 - 90 grams protein  Nutrition Dx:  Inadequate oral intake r/t altered GI function AEB NPO status and need for TPN.  Goal:  Pt to meet >/= 90% of their estimated nutrition needs - unmet 2/2 slow advancement of nutrition support 2/2 refeeding syndrome  Monitor:  weight trends, lab trends, I/O's, TPN tolerance/adequacy, transition to oral diet  Jarold Motto MS, RD, LDN Pager: [phone] After-hours pager: [phone]

## 2012-02-15 NOTE — Progress Notes (Signed)
Internal Medicine Teaching Service Attending Note Date: 02/15/2012  Patient name: Jessica [name] [facility]  Medical record number: [id number]  Date of birth: [birth year]    This patient has been seen and discussed with the house staff. Please see their note for complete details. I concur with their findings with the following additions/corrections: Patient will most likely be ready for discharge by tomorrow once she starts accepting oral diet.  Lars Mage 02/15/2012, 3:37 PM

## 2012-02-15 NOTE — Progress Notes (Signed)
Notified by RN that pt was requesting a hospital bed for home at discharge. Because pt doesn't have a medical need for particular position that can't be achieved with a normal bed, she would not qualify for Medicare to pay for it and thus would be responsible for the approx. $150/month cost of the bed.  Advised the RN that the pt could likely achieve with extra pillows what she is hoping to gain with the bed.

## 2012-02-15 NOTE — Progress Notes (Signed)
Subjective: [name] was seen and examined while lying in bed.  She is post op day #3 s/p ex lap with LOA for PSBO.  Her abdominal pain is slightly improved since yesterday.  NGT still in place draining 350cc/24 hours but tolerated clamping well yesterday.  She denies passing any gas and no BM since surgery.  Otherwise, she has no other complaints.  She denies any nausea, vomiting, fever, chills, chest pain, shortness of breath at this time.   Objective: Vital signs in last 24 hours: Filed Vitals:   02/14/12 2350 02/15/12 0157 02/15/12 0358 02/15/12 0518  BP:  161/64  175/63  Pulse:  84  82  Temp:  98.1 F (36.7 C)  99.2 F (37.3 C)  TempSrc:  Oral  Oral  Resp: 17 15 12 18   Height:      Weight:      SpO2: 100% 98% 100% 100%   Weight change:   Intake/Output Summary (Last 24 hours) at 02/15/12 0741 Last data filed at 02/15/12 0600  Gross per 24 hour  Intake   2193 ml  Output   1150 ml  Net   1043 ml   General: sitting in chair, tearful HEENT: PERRLA, EOMI, NGT in place Cardiac: RRR, no rubs, murmurs or gallops Pulm: clear to auscultation bilaterally, -wheezing Abd: soft, midline incision staples in place, no erythema, soft, + bowel sounds. Non tender to light palpation  Ext: warm and well perfused, no pedal edema, +2dp b/l Neuro: alert and oriented X3, cranial nerves II-XII grossly intact  Lab Results: Basic Metabolic Panel:  Lab 02/15/12 1191 02/14/12 0755  NA 138 136  K 3.3* 3.3*  CL 105 107  CO2 24 22  GLUCOSE 122* 134*  BUN 8 10  CREATININE 0.40* 0.50  CALCIUM 8.8 8.6  MG 1.7 1.8  PHOS 2.7 2.4   Liver Function Tests:  Lab 02/15/12 0545 02/13/12 0500  AST 13 18  ALT 8 9  ALKPHOS 80 69  BILITOT 0.3 0.2*  PROT 5.9* 5.8*  ALBUMIN 2.3* 2.6*   CBC:  Lab 02/13/12 0500 02/12/12 1907  WBC 5.2 5.1  NEUTROABS 3.7 --  HGB 14.4 13.4  HCT 40.3 37.7  MCV 92.9 94.0  PLT 305 268   Cardiac Enzymes:  Lab 02/08/12 1259 02/08/12 0812  CKTOTAL -- 59   CKMB -- 6.4*  CKMBINDEX -- --  TROPONINI <0.30 --   Coagulation:  Lab 02/08/12 0811  LABPROT 12.8  INR 0.97   Urinalysis:  Lab 02/13/12 0340 02/08/12 1316  COLORURINE YELLOW YELLOW  LABSPEC 1.012 1.015  PHURINE 7.0 7.0  GLUCOSEU 100* NEGATIVE  HGBUR SMALL* TRACE*  BILIRUBINUR NEGATIVE NEGATIVE  KETONESUR NEGATIVE 15*  PROTEINUR NEGATIVE NEGATIVE  UROBILINOGEN 0.2 0.2  NITRITE NEGATIVE NEGATIVE  LEUKOCYTESUR NEGATIVE NEGATIVE   Studies/Results: Dg Chest Port 1 View  02/13/2012  *RADIOLOGY REPORT*  Clinical Data: Postop fever, weakness  PORTABLE CHEST - 1 VIEW  Comparison: Portable chest x-ray of 02/12/2012  Findings: There is mild bibasilar linear atelectasis present.  No focal infiltrate or effusion is seen. A right PICC line is present with the tip overlying the mid upper SVC.  No pneumothorax is noted.  An NG tube extends below the hemidiaphragm.  IMPRESSION:  1.  Right PICC line tip overlies upper SVC.  No pneumothorax. 2.  Mild bibasilar linear atelectasis.   Original Report Authenticated By: Dwyane Dee, M.D.    Medications: I have reviewed the patient's current medications. Scheduled Meds:    .  antiseptic oral rinse  15 mL Mouth Rinse q12n4p  . chlorhexidine  15 mL Mouth Rinse BID  . fentaNYL  50 mcg Transdermal Q72H  . folic acid  1 mg Intravenous Daily  . heparin  5,000 Units Subcutaneous Q8H  .  HYDROmorphone (DILAUDID) injection  0.5 mg Intravenous Q4H  . HYDROmorphone PCA 0.3 mg/mL   Intravenous Q4H  . insulin aspart  0-9 Units Subcutaneous Q4H  . [COMPLETED] magnesium sulfate 1 - 4 g bolus IVPB  2 g Intravenous Once  . nicotine  21 mg Transdermal Daily  . [EXPIRED] potassium chloride  10 mEq Intravenous Q1 Hr x 5  . [COMPLETED] potassium chloride  10 mEq Intravenous Once  . [COMPLETED] potassium phosphate IVPB (mmol)  10 mmol Intravenous Once  . thiamine  100 mg Intravenous Daily  . [DISCONTINUED] HYDROmorphone PCA 0.3 mg/mL   Intravenous Q4H   Continuous  Infusions:    . sodium chloride 55 mL/hr at 02/14/12 1900  . TPN (CLINIMIX) +/- additives 50 mL/hr at 02/14/12 1747   And  . fat emulsion 240 mL (02/14/12 1747)  . [EXPIRED] TPN (CLINIMIX) +/- additives 40 mL/hr at 02/13/12 2036   PRN Meds:.diphenhydrAMINE, diphenhydrAMINE, diphenhydrAMINE, hydrALAZINE, naloxone, ondansetron (ZOFRAN) IV, phenol, sodium chloride, sodium chloride, [DISCONTINUED]  HYDROmorphone (DILAUDID) injection Assessment/Plan: 71 yo woman with h/o HIV, HTN and previous abdominal surgeries was admitted on 02/08/12 for  # Partial small bowel obstruction, symptomatic: h/o of multiple abdominal surgeries and is s/p small bowel resection 10/12; CT scan revealed mild distal small bowel obstruction with transition in the mid pelvis likely due to adhesions.  Last colonoscopy 5/12 with extensive left sided diverticula. S/p ex lap with LOA yesterday--POD #3 - Surgery following--continue bowl rest and NGT, awaiting bowel function, mobilize and pulmonary toilet - NGT in place, output 350cc output/24 hours--will discontinue today - will transition to clear liquids today - IVF - Continue TPN - Continue pain mgmt: Dilaudid & fentanyl patch - zofran prn nausea  # Post operative fever 02/13/12: Resolved.  Afebrile at this time. No leukocytosis.  -u/a negative for nitrities and leukocytes, wbc 0-2,  -f/u blood cx x2--NGTD -continue to monitor  #Electrolyte abnormalities: likely related to NG output -replenished -continue to monitor  -on TNA  # HIV disease--well controlled: CD4 = 680 & HIV RNA Quant <20 02/08/12.  Followed by Dr. 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 Brunilda [name] with Urology.   # Tobacco abuse: treatment with  nicotine patch during hospitalization   Diet: TPN via PICC  DVT PPX: [place] Lovenox Dispo: Disposition is deferred at this time, awaiting clinical improvement.  .  The patient does not have a current PCP Jessica Sayre, MD), therefore will be requiring OPC follow-up after discharge.  The patient does not have transportation limitations that hinder transportation to clinic appointments.   LOS: 7 days   Darden Palmer 02/15/2012, 7:41 AM

## 2012-02-15 NOTE — Progress Notes (Signed)
Patient ID: Jessica [name], female   DOB: [birth year], 71 y.o.   MRN: [id number] 3 Days Post-Op  Subjective: Pt feels well today.  Still no flatus, but tolerated NGT clamping trials well with no nausea.  Objective: Vital signs in last 24 hours: Temp:  [98.1 F (36.7 C)-99.2 F (37.3 C)] 99.2 F (37.3 C) (12/05 0518) Pulse Rate:  [82-95] 82  (12/05 0518) Resp:  [12-18] 18  (12/05 0518) BP: (137-184)/(57-66) 175/63 mmHg (12/05 0518) SpO2:  [91 %-100 %] 100 % (12/05 0518) Last BM Date: 02/06/12  Intake/Output from previous day: 12/04 0701 - 12/05 0700 In: 2193 [I.V.:1100; IV Piggyback:300; TPN:793] Out: 1150 [Urine:800; Emesis/NG output:350] Intake/Output this shift:    PE: Abd: soft, appropriately tender, +BS, ND, incision c/d/i with staples  Lab Results:   Basename 02/13/12 0500 02/12/12 1907  WBC 5.2 5.1  HGB 14.4 13.4  HCT 40.3 37.7  PLT 305 268   BMET  Basename 02/15/12 0545 02/14/12 0755  NA 138 136  K 3.3* 3.3*  CL 105 107  CO2 24 22  GLUCOSE 122* 134*  BUN 8 10  CREATININE 0.40* 0.50  CALCIUM 8.8 8.6   PT/INR No results found for this basename: LABPROT:2,INR:2 in the last 72 hours CMP     Component Value Date/Time   NA 138 02/15/2012 0545   K 3.3* 02/15/2012 0545   CL 105 02/15/2012 0545   CO2 24 02/15/2012 0545   GLUCOSE 122* 02/15/2012 0545   BUN 8 02/15/2012 0545   CREATININE 0.40* 02/15/2012 0545   CREATININE 0.62 10/20/2011 1006   CALCIUM 8.8 02/15/2012 0545   PROT 5.9* 02/15/2012 0545   ALBUMIN 2.3* 02/15/2012 0545   AST 13 02/15/2012 0545   ALT 8 02/15/2012 0545   ALKPHOS 80 02/15/2012 0545   BILITOT 0.3 02/15/2012 0545   GFRNONAA >90 02/15/2012 0545   GFRAA >90 02/15/2012 0545   Lipase     Component Value Date/Time   LIPASE 36 02/08/2012 0508       Studies/Results: No results found.  Anti-infectives: Anti-infectives     Start     Dose/Rate Route Frequency Ordered Stop   02/13/12 0600   cefOXitin (MEFOXIN) 2 g in dextrose 5 % 50 mL  IVPB  Status:  Discontinued        2 g 100 mL/hr over 30 Minutes Intravenous 4 times per day 02/13/12 0414 02/13/12 1312   02/12/12 0900   cefOXitin (MEFOXIN) 2 g in dextrose 5 % 50 mL IVPB     Comments: On call to OR      2 g 100 mL/hr over 30 Minutes Intravenous  Once 02/12/12 0853 02/12/12 1345           Assessment/Plan  1. S/p ex lap with LOA 2. Post op ileus, resolving 3. PCM/TNA  Plan: 1. DC NGT 2. Start clears 3. Dc PCA, since she is not really using this and she has schedule dilaudid q 4 hours. 4. Cont mobilization  5. If tolerates clears today, will look at weaning to dc TNA tomorrow  LOS: 7 days    Clement Deneault E 02/15/2012, 8:35 AM Pager: [phone]

## 2012-02-15 NOTE — Progress Notes (Signed)
Dc ngt Sips of clears. Continue TNA

## 2012-02-16 LAB — BASIC METABOLIC PANEL
BUN: 9 mg/dL (ref 6–23)
Chloride: 104 mEq/L (ref 96–112)
GFR calc Af Amer: >90 mL/min (ref >90)
GFR calc non Af Amer: >90 mL/min (ref >90)
Glucose, Bld: 130 mg/dL — ABNORMAL HIGH (ref 70–99)
Potassium: 3.4 mEq/L — ABNORMAL LOW (ref 3.5–5.1)
Sodium: 140 mEq/L (ref 135–145)

## 2012-02-16 LAB — GLUCOSE, CAPILLARY
Glucose-Capillary: 124 mg/dL — ABNORMAL HIGH (ref 70–99)
Glucose-Capillary: 110 mg/dL — ABNORMAL HIGH (ref 70–99)
Glucose-Capillary: 115 mg/dL — ABNORMAL HIGH (ref 70–99)

## 2012-02-16 LAB — PHOSPHORUS: Phosphorus: 3.4 mg/dL (ref 2.3–4.6)

## 2012-02-16 MED ORDER — MAGNESIUM SULFATE 40 MG/ML IJ SOLN
2.0000 g | Freq: Once | INTRAMUSCULAR | Status: AC
  Administered 2012-02-16: 2 g via INTRAVENOUS
  Filled 2012-02-16: qty 50

## 2012-02-16 MED ORDER — INSULIN ASPART 100 UNIT/ML ~~LOC~~ SOLN: 0.0000 [IU] | Freq: Three times a day (TID) | SUBCUTANEOUS | Status: DC

## 2012-02-16 MED ORDER — POTASSIUM CHLORIDE 10 MEQ/50ML IV SOLN
10.0000 meq | INTRAVENOUS | Status: AC
  Administered 2012-02-16 (×5): 10 meq via INTRAVENOUS
  Filled 2012-02-16 (×5): qty 50

## 2012-02-16 MED ORDER — POTASSIUM CHLORIDE IN NACL 20-0.9 MEQ/L-% IV SOLN
INTRAVENOUS | Status: DC
  Administered 2012-02-16 – 2012-02-17 (×2): via INTRAVENOUS
  Filled 2012-02-16 (×3): qty 1000

## 2012-02-16 MED ORDER — OXYCODONE HCL 5 MG PO TABS
5.0000 mg | ORAL_TABLET | ORAL | Status: DC | PRN
  Administered 2012-02-17 – 2012-02-18 (×4): 10 mg via ORAL
  Filled 2012-02-16 (×5): qty 2

## 2012-02-16 MED ORDER — CLINIMIX E/DEXTROSE (5/15) 5 % IV SOLN
INTRAVENOUS | Status: AC
  Filled 2012-02-16: qty 2000

## 2012-02-16 NOTE — Progress Notes (Signed)
PARENTERAL NUTRITION CONSULT NOTE - FOLLOW UP  Pharmacy Consult:  TPN Indication: pSBO  No Known Allergies  Patient Measurements: Height: 5\' 7"  (170.2 cm) Weight: 125 lb 7.1 oz (56.9 kg) IBW/kg (Calculated) : 61.6   Vital Signs: Temp: 97.6 F (36.4 C) (12/06 0500) BP: 188/78 mmHg (12/06 0500) Pulse Rate: 81  (12/06 0500) Intake/Output from previous day: 12/05 0701 - 12/06 0700 In: 1965 [I.V.:1265; IV Piggyback:300; TPN:400] Out: 1750 [Urine:1750]  Labs: No results found for this basename: WBC:3,HGB:3,HCT:3,PLT:3,APTT:3,INR:3 in the last 72 hours   Basename 02/16/12 0500 02/15/12 0545 02/14/12 0755  NA 140 138 136  K 3.4* 3.3* 3.3*  CL 104 105 107  CO2 24 24 22   GLUCOSE 130* 122* 134*  BUN 9 8 10   CREATININE 0.34* 0.40* 0.50  LABCREA -- -- --  CREAT24HRUR -- -- --  CALCIUM 9.5 8.8 8.6  MG 1.8 1.7 1.8  PHOS 3.4 2.7 2.4  PROT -- 5.9* --  ALBUMIN -- 2.3* --  AST -- 13 --  ALT -- 8 --  ALKPHOS -- 80 --  BILITOT -- 0.3 --  BILIDIR -- -- --  IBILI -- -- --  PREALBUMIN -- -- --  TRIG -- -- --  CHOLHDL -- -- --  CHOL -- -- --   Estimated Creatinine Clearance: 59.6 ml/min (by C-G formula based on Cr of 0.34).    Basename 02/16/12 0352 02/15/12 2334 02/15/12 1947  GLUCAP 143* 117* 150*     Insulin Requirements in the past 24 hours:  2 units SSI since 1800 last night  Current Nutrition:  Clinimix E 5/15   Assessment:  Admit: admitted 11/28 with abd pain, bilious emesis and no flatus/BM x 2 days PTA. Now s/p exp lap and LOA (12/2) for CT with SBO likely d/t adhesions.  GI: hx prior abd surgeries including SBO with SBR. Was NPO x 5 days, appears to be refeeding with initiation of TPN calories - NG O/P increased slightly ( ), no longer required Zofran use, NGT d/c'ed.  Started on clear liquid diet Endo: No A1c or TSH this admit. No prior hx DM. CBG controlled with minimal SSI. Lytes: hypokalemia and hypercalcemia (corrected to 10.86, Ca x PO4 < 55), others  WNL Renal: SCr normal and stable, great UOP, even I/O's, NS at 55 ml/hr Pulm: stable on 2L [place] Neuro: Folate and Thiamine IV through 12/5 to prevent Wernicke's.  Also on fentanyl patch and Dilaudid PCA, nicotine patch Cards: Hx HTN / HLD - BP elevated, HR normal - home med not yet resumed, on PRN hydralazine Hepatobil: LFTs / TG WNL.  Prealbumin low for goal 18-40 (11.1).  ID: Afebrile, WBC WNL. Off abx. Hx of HIV, meds not resumed d/t NPO status, CD4 1210 and viral load < 40 in 06/2011.  Best Practices: SQ heparin TPN Access: PICC 02/12/2012  TPN day#: 12/2 >>  Nutritional Goals per RD assessment:  1650-1775 kCal, 75-90 grams of protein per day, 1.5-1.8 L/day   Plan:  - Wean TPN to 25 ml/hr and d/c at 1800 per PA order - KCL x 5 runs and add KCL to IVF as patient has been requiring KCL supplementation on a daily basis (NS with 20 mEq KCL at 55 ml/hr) - Magnesium 2gm IV x 1 - Consider resuming home med benazepril for better BP control - Further electrolyte management per MD/PA     Chelsea Aus D. Laney Potash, PharmD, BCPS Pager:  [phone] 02/16/2012, 8:12 AM

## 2012-02-16 NOTE — Progress Notes (Signed)
Subjective: Jessica [name] was seen and examined while lying in bed.  She is post op day #4 s/p ex lap with LOA for PSBO.  She passed gas early this morning but had severe abdominal pain yesterday and overnight, but improved a little this morning.  She was able to walk around yesterday and tolerated clear liquids for breakfast and lunch but did not have dinner due to abdominal pain.  Her a NGT was discontinued yesterday.   She denies having any BM since surgery.  Otherwise, she has no other complaints.  She denies any nausea, vomiting, fever, chills, chest pain, or shortness of breath at this time.    Objective: Vital signs in last 24 hours: Filed Vitals:   02/15/12 1345 02/15/12 2109 02/16/12 0232 02/16/12 0500  BP: 189/78 161/54 195/74 188/78  Pulse: 89 84 88 81  Temp: 99.3 F (37.4 C) 97.9 F (36.6 C) 98.2 F (36.8 C) 97.6 F (36.4 C)  TempSrc:      Resp: 20 18 14 16   Height:      Weight:      SpO2: 98% 95% 95% 96%   Weight change:   Intake/Output Summary (Last 24 hours) at 02/16/12 0732 Last data filed at 02/16/12 0500  Gross per 24 hour  Intake   1965 ml  Output   1750 ml  Net    215 ml   General: sitting in chair, tearful HEENT: PERRLA, EOMI, NGT in place Cardiac: RRR, no rubs, murmurs or gallops Pulm: clear to auscultation bilaterally, -wheezing Abd: soft, midline incision staples in place, no erythema, soft, + bowel sounds. Non tender to light palpation  Ext: warm and well perfused, no pedal edema, +2dp b/l Neuro: alert and oriented X3, cranial nerves II-XII grossly intact  Lab Results: Basic Metabolic Panel:  Lab 02/16/12 5784 02/15/12 0545  NA 140 138  K 3.4* 3.3*  CL 104 105  CO2 24 24  GLUCOSE 130* 122*  BUN 9 8  CREATININE 0.34* 0.40*  CALCIUM 9.5 8.8  MG 1.8 1.7  PHOS 3.4 2.7   Liver Function Tests:  Lab 02/15/12 0545 02/13/12 0500  AST 13 18  ALT 8 9  ALKPHOS 80 69  BILITOT 0.3 0.2*  PROT 5.9* 5.8*  ALBUMIN 2.3* 2.6*   CBC:  Lab  02/13/12 0500 02/12/12 1907  WBC 5.2 5.1  NEUTROABS 3.7 --  HGB 14.4 13.4  HCT 40.3 37.7  MCV 92.9 94.0  PLT 305 268   Urinalysis:  Lab 02/13/12 0340  COLORURINE YELLOW  LABSPEC 1.012  PHURINE 7.0  GLUCOSEU 100*  HGBUR SMALL*  BILIRUBINUR NEGATIVE  KETONESUR NEGATIVE  PROTEINUR NEGATIVE  UROBILINOGEN 0.2  NITRITE NEGATIVE  LEUKOCYTESUR NEGATIVE   Medications: I have reviewed the patient's current medications. Scheduled Meds:    . antiseptic oral rinse  15 mL Mouth Rinse q12n4p  . chlorhexidine  15 mL Mouth Rinse BID  . feeding supplement  1 Container Oral BID BM  . fentaNYL  50 mcg Transdermal Q72H  . [COMPLETED] folic acid  1 mg Intravenous Daily  . heparin  5,000 Units Subcutaneous Q8H  . insulin aspart  0-9 Units Subcutaneous Q4H  . [COMPLETED] magnesium sulfate 1 - 4 g bolus IVPB  2 g Intravenous Once  . nicotine  21 mg Transdermal Daily  . [COMPLETED] potassium chloride  10 mEq Intravenous Q1 Hr x 5  . [COMPLETED] potassium phosphate IVPB (mmol)  10 mmol Intravenous Once  . [COMPLETED] thiamine  100 mg Intravenous Daily  . [  DISCONTINUED]  HYDROmorphone (DILAUDID) injection  0.5 mg Intravenous Q4H  . [DISCONTINUED] HYDROmorphone PCA 0.3 mg/mL   Intravenous Q4H   Continuous Infusions:    . sodium chloride 55 mL/hr at 02/16/12 0624  . [EXPIRED] TPN (CLINIMIX) +/- additives 50 mL/hr at 02/14/12 1747   And  . [EXPIRED] fat emulsion 240 mL (02/14/12 1747)  . TPN (CLINIMIX) +/- additives 50 mL/hr at 02/15/12 1721   PRN Meds:.diphenhydrAMINE, diphenhydrAMINE, diphenhydrAMINE, hydrALAZINE, HYDROmorphone (DILAUDID) injection, naloxone, ondansetron (ZOFRAN) IV, phenol, sodium chloride, sodium chloride Assessment/Plan: 71 yo woman with h/o HIV, HTN and previous abdominal surgeries was admitted on 02/08/12 for  # Partial small bowel obstruction, symptomatic: h/o of multiple abdominal surgeries and is s/p small bowel resection 10/12; CT scan revealed mild distal small  bowel obstruction with transition in the mid pelvis likely due to adhesions.  Last colonoscopy 5/12 with extensive left sided diverticula. S/p ex lap with LOA yesterday--POD #4 - Surgery following--may d/c TNA today, awaiting bowel function--passed gas today, mobilize and pulmonary toilet - NGT discontinued - tolerating clears - IVF - Continue pain mgmt: Dilaudid & fentanyl patch - zofran prn nausea  # Post operative fever 02/13/12: Resolved.  Afebrile at this time. No leukocytosis.  -u/a negative for nitrities and leukocytes, wbc 0-2,  -f/u blood cx x2--NGTD -continue to monitor  #Electrolyte abnormalities:  -replenished -continue to monitor  -on TNA  # HIV disease--well controlled: CD4 = 680 & HIV RNA Quant <20 02/08/12.  Followed by Dr. 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 Brunilda Payor with Urology.   # Tobacco abuse: treatment with nicotine patch during hospitalization   Diet: TPN via PICC  DVT PPX: [place] Lovenox Dispo: Disposition is deferred at this time, awaiting clinical improvement.  .  The patient does not have a current PCP [name], MD), therefore will be requiring OPC follow-up after discharge.  The patient does not have transportation limitations that hinder transportation to clinic appointments.   LOS: 8 days   Darden Palmer 02/16/2012, 7:32 AM

## 2012-02-16 NOTE — Progress Notes (Signed)
Agree with above.  Full liquids.

## 2012-02-16 NOTE — Progress Notes (Signed)
Internal Medicine Teaching Service Attending Note Date: 02/16/2012  Patient name: Jessica [name]  Medical record number: [id number]  Date of birth: 1940/06/17    This patient has been seen and discussed with the house staff. Please see their note for complete details. I concur with their findings with the following additions/corrections: patient is making small progress after NG tube being taken out. She has passed flatus and no vomiting reported as of now. She does complain of extreme pain whenever she eats. Plan to continue advanced diet as tolerated.  Lars Mage 02/16/2012, 12:44 PM

## 2012-02-16 NOTE — Progress Notes (Signed)
Patient ID: Jessica [name], female   DOB: [birth year], 71 y.o.   MRN: [id number] 4 Days Post-Op  Subjective: Pt feeling ok, passing flatus now.  Tolerating clear liquids  Objective: Vital signs in last 24 hours: Temp:  [97.6 F (36.4 C)-99.3 F (37.4 C)] 97.6 F (36.4 C) (12/06 0500) Pulse Rate:  [81-91] 81  (12/06 0500) Resp:  [14-20] 16  (12/06 0500) BP: (139-195)/(54-86) 188/78 mmHg (12/06 0500) SpO2:  [95 %-100 %] 96 % (12/06 0500) Last BM Date: 03/07/12  Intake/Output from previous day: 12/05 0701 - 12/06 0700 In: 1965 [I.V.:1265; IV Piggyback:300; TPN:400] Out: 1750 [Urine:1750] Intake/Output this shift:    PE: Abd: soft, +BS, minimal distention, incision c/d/i, appropriately tender  Lab Results:  No results found for this basename: WBC:2,HGB:2,HCT:2,PLT:2 in the last 72 hours BMET  Basename 02/16/12 0500 02/15/12 0545  NA 140 138  K 3.4* 3.3*  CL 104 105  CO2 24 24  GLUCOSE 130* 122*  BUN 9 8  CREATININE 0.34* 0.40*  CALCIUM 9.5 8.8   PT/INR No results found for this basename: LABPROT:2,INR:2 in the last 72 hours CMP     Component Value Date/Time   NA 140 02/16/2012 0500   K 3.4* 02/16/2012 0500   CL 104 02/16/2012 0500   CO2 24 02/16/2012 0500   GLUCOSE 130* 02/16/2012 0500   BUN 9 02/16/2012 0500   CREATININE 0.34* 02/16/2012 0500   CREATININE 0.62 10/20/2011 1006   CALCIUM 9.5 02/16/2012 0500   PROT 5.9* 02/15/2012 0545   ALBUMIN 2.3* 02/15/2012 0545   AST 13 02/15/2012 0545   ALT 8 02/15/2012 0545   ALKPHOS 80 02/15/2012 0545   BILITOT 0.3 02/15/2012 0545   GFRNONAA >90 02/16/2012 0500   GFRAA >90 02/16/2012 0500   Lipase     Component Value Date/Time   LIPASE 36 02/08/2012 0508       Studies/Results: No results found.  Anti-infectives: Anti-infectives     Start     Dose/Rate Route Frequency Ordered Stop   02/13/12 0600   cefOXitin (MEFOXIN) 2 g in dextrose 5 % 50 mL IVPB  Status:  Discontinued        2 g 100 mL/hr over 30 Minutes  Intravenous 4 times per day 02/13/12 0414 02/13/12 1312   02/12/12 0900   cefOXitin (MEFOXIN) 2 g in dextrose 5 % 50 mL IVPB     Comments: On call to OR      2 g 100 mL/hr over 30 Minutes Intravenous  Once 02/12/12 0853 02/12/12 1345           Assessment/Plan  1. SBO, ex lap s/p LOA 2. Post op ileus, resolving 3. PCM/TNA  Plan: 1. Advance to full liquids 2. Begin to wean TNA today 3 add oral pain medication 4. mobilize   LOS: 8 days    Alik Mawson E 02/16/2012, 9:43 AM Pager: [phone]

## 2012-02-17 LAB — GLUCOSE, CAPILLARY
Glucose-Capillary: 104 mg/dL — ABNORMAL HIGH (ref 70–99)
Glucose-Capillary: 101 mg/dL — ABNORMAL HIGH (ref 70–99)

## 2012-02-17 LAB — BASIC METABOLIC PANEL
BUN: 8 mg/dL (ref 6–23)
Calcium: 9.4 mg/dL (ref 8.4–10.5)
Creatinine, Ser: 0.38 mg/dL — ABNORMAL LOW (ref 0.50–1.10)
GFR calc Af Amer: >90 mL/min (ref >90)
GFR calc non Af Amer: >90 mL/min (ref >90)
Glucose, Bld: 106 mg/dL — ABNORMAL HIGH (ref 70–99)

## 2012-02-17 MED ORDER — BENAZEPRIL HCL 40 MG PO TABS
40.0000 mg | ORAL_TABLET | Freq: Every day | ORAL | Status: DC
  Administered 2012-02-17 – 2012-02-19 (×3): 40 mg via ORAL
  Filled 2012-02-17 (×3): qty 1

## 2012-02-17 NOTE — Progress Notes (Signed)
Internal Medicine Teaching Service Attending Note Date: 02/17/2012  Patient name: Jessica [name]  Medical record number: [id number]  Date of birth: Oct 24, 1940    This patient has been seen and discussed with the house staff. Please see their note for complete details. I concur with their findings with the following additions/corrections: Patient continue to make slow but steady progress. She was eating breakfast at the time of my visit. Continue to monitor. Anticipate discharge early next week.  Lars Mage 02/17/2012, 6:42 PM

## 2012-02-17 NOTE — Progress Notes (Signed)
Subjective: Did not sleep well overnight night, but not new (chronic problem); did sleep well this morning; pain well controlled; passing flatus but no BM; plans to walk around today; tolerated meal this am (ate 1/2 of grits), but does not some discomfort with eating.  Denies nausea, vomiting, chills, SOB.   Objective: Vital signs in last 24 hours: Filed Vitals:   02/17/12 0215 02/17/12 0245 02/17/12 0548 02/17/12 0635  BP: 189/74 174/70 190/89 189/76  Pulse: 84 72 87 74  Temp: 98.1 F (36.7 C)  98.4 F (36.9 C)   TempSrc: Oral     Resp: 16  16   Height:      Weight:      SpO2: 97%  97%    Weight change:   Intake/Output Summary (Last 24 hours) at 02/17/12 1249 Last data filed at 02/17/12 0824  Gross per 24 hour  Intake 4046.78 ml  Output   1900 ml  Net 2146.78 ml   General: resting in bed HEENT: PERRL, EOMI, no scleral icterus Cardiac: RRR, no rubs, murmurs or gallops Pulm: clear to auscultation bilaterally, moving normal volumes of air Abd: soft, nontender, nondistended, BS normoactive; incision line is clean/nonbloody without drainage and staples are intact Ext: warm and well perfused, no pedal edema Neuro: alert and oriented X3  Lab Results: Basic Metabolic Panel:  Lab 02/17/12 1610 02/16/12 0500 02/15/12 0545  NA 138 140 --  K 4.1 3.4* --  CL 103 104 --  CO2 24 24 --  GLUCOSE 106* 130* --  BUN 8 9 --  CREATININE 0.38* 0.34* --  CALCIUM 9.4 9.5 --  MG 1.8 1.8 --  PHOS -- 3.4 2.7   Liver Function Tests:  Lab 02/15/12 0545 02/13/12 0500  AST 13 18  ALT 8 9  ALKPHOS 80 69  BILITOT 0.3 0.2*  PROT 5.9* 5.8*  ALBUMIN 2.3* 2.6*   CBC:  Lab 02/13/12 0500 02/12/12 1907  WBC 5.2 5.1  NEUTROABS 3.7 --  HGB 14.4 13.4  HCT 40.3 37.7  MCV 92.9 94.0  PLT 305 268   CBG:  Lab 02/17/12 1210 02/17/12 0753 02/16/12 2151 02/16/12 1616 02/16/12 1208 02/16/12 0820  GLUCAP 89 104* 105* 115* 110* 124*   Fasting Lipid Panel:  Lab 02/13/12 0500  CHOL 86  HDL --   LDLCALC --  TRIG 51  CHOLHDL --  LDLDIRECT --    Urinalysis:  Lab 02/13/12 0340  COLORURINE YELLOW  LABSPEC 1.012  PHURINE 7.0  GLUCOSEU 100*  HGBUR SMALL*  BILIRUBINUR NEGATIVE  KETONESUR NEGATIVE  PROTEINUR NEGATIVE  UROBILINOGEN 0.2  NITRITE NEGATIVE  LEUKOCYTESUR NEGATIVE     Micro Results: Recent Results (from the past 240 hour(s))  SURGICAL PCR SCREEN     Status: Normal   Collection Time   02/12/12 12:48 PM      Component Value Range Status Comment   MRSA, PCR NEGATIVE  NEGATIVE Final    Staphylococcus aureus NEGATIVE  NEGATIVE Final   CULTURE, BLOOD (ROUTINE X 2)     Status: Normal (Preliminary result)   Collection Time   02/13/12  3:30 AM      Component Value Range Status Comment   Specimen Description BLOOD LEFT ARM   Final    Special Requests BOTTLES DRAWN AEROBIC AND ANAEROBIC 5CC EA   Final    Culture  Setup Time 02/13/2012 08:57   Final    Culture     Final    Value:  BLOOD CULTURE RECEIVED NO GROWTH TO DATE CULTURE WILL BE HELD FOR 5 DAYS BEFORE ISSUING A FINAL NEGATIVE REPORT   Report Status PENDING   Incomplete   URINE CULTURE     Status: Normal   Collection Time   02/13/12  3:40 AM      Component Value Range Status Comment   Specimen Description URINE, CATHETERIZED   Final    Special Requests NONE   Final    Culture  Setup Time 02/13/2012 04:22   Final    Colony Count NO GROWTH   Final    Culture NO GROWTH   Final    Report Status 02/14/2012 FINAL   Final   CULTURE, BLOOD (ROUTINE X 2)     Status: Normal (Preliminary result)   Collection Time   02/13/12  3:45 AM      Component Value Range Status Comment   Specimen Description BLOOD LEFT ARM   Final    Special Requests BOTTLES DRAWN AEROBIC AND ANAEROBIC 4CC EA   Final    Culture  Setup Time 02/13/2012 08:57   Final    Culture     Final    Value:        BLOOD CULTURE RECEIVED NO GROWTH TO DATE CULTURE WILL BE HELD FOR 5 DAYS BEFORE ISSUING A FINAL NEGATIVE REPORT   Report Status PENDING    Incomplete    Medications: I have reviewed the patient's current medications. Scheduled Meds:   . antiseptic oral rinse  15 mL Mouth Rinse q12n4p  . chlorhexidine  15 mL Mouth Rinse BID  . feeding supplement  1 Container Oral BID BM  . fentaNYL  50 mcg Transdermal Q72H  . heparin  5,000 Units Subcutaneous Q8H  . insulin aspart  0-9 Units Subcutaneous TID AC & HS  . nicotine  21 mg Transdermal Daily  . [COMPLETED] potassium chloride  10 mEq Intravenous Q1 Hr x 5   Continuous Infusions:   . 0.9 % NaCl with KCl 20 mEq / L 55 mL/hr at 02/17/12 1151  . [EXPIRED] TPN (CLINIMIX) +/- additives 25 mL/hr at 02/16/12 0945   PRN Meds:.diphenhydrAMINE, diphenhydrAMINE, diphenhydrAMINE, hydrALAZINE, HYDROmorphone (DILAUDID) injection, naloxone, ondansetron (ZOFRAN) IV, oxyCODONE, phenol, sodium chloride, sodium chloride Assessment/Plan: 71 yo woman with h/o HIV, HTN and previous abdominal surgeries was admitted on 02/08/12 for   # Partial small bowel obstruction, symptomatic: h/o of multiple abdominal surgeries and is s/p small bowel resection 10/12; CT scan revealed mild distal small bowel obstruction with transition in the mid pelvis likely due to adhesions. Last colonoscopy 5/12 with extensive left sided diverticula. S/p ex lap with LOA yesterday--POD #5 - Surgery following-- continues to pass gas today, mobilize and pulmonary toilet - awaiting BM - tolerating full liquids - surgery plans to advance to regular once she has BM - will d/c IVF in setting of HTN and PO intake - Continue pain mgmt: Dilaudid (has been receiving 2mg  q2h prn) & fentanyl patch ( ) - zofran prn nausea (has not required zofran in 24h)  # HIV disease--well controlled: CD4 = 680 & HIV RNA Quant <20 02/08/12. Followed by Dr. 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 Brunilda Payor with Urology.   # Tobacco abuse: treatment with nicotine patch during hospitalization    Diet: Full liquid DVT PPX: [place] Heparin Dispo: Disposition is deferred at this time, awaiting clinical improvement.   The patient does have a current PCP [name], MD), therefore will be requiring OPC follow-up after discharge.  The patient does not have transportation limitations that hinder transportation to clinic appointments.   .Services Needed at time of discharge: Y = Yes, Blank = No PT:   OT:   RN:   Equipment:   Other:     LOS: 9 days   KAPADIA, Adi Doro 02/17/2012, 12:49 PM

## 2012-02-17 NOTE — Progress Notes (Signed)
5 Days Post-Op   Assessment: s/p Procedure(s): EXPLORATORY LAPAROTOMY Patient Active Problem List  Diagnosis  . HIV INFECTION  . HERPES, GENITAL NEC  . FUNGAL INFECTION  . DEPRESSION  . GLAUCOMA NOS  . HYPERTENSION  . CYSTITIS, ACUTE  . Displacement of Lumbar Intervertebral Disc without Myelopathy  . PARESTHESIA  . ABFND, PAP SMEAR, CERVIX/CERVICAL HPV NEC  . CONSTIPATION  . Chronic pain syndrome  . Seborrhea  . Syncope  . PAD (peripheral artery disease)  . SBO (small bowel obstruction), recurrent  . Status post aortobifemoral bypass surgery  . H/O cystitis  . Voiding dysfunction  . Diverticula of colon    Slowly progressing. Not ready to advance diet yet  Plan: continue current management. If she has a bowel movement today, we can advance her diet tomorrow.  Subjective: Mild nausea her main. She is passing lots of gas. She is ambulating around the hallways. She has not had a bowel movement. She is having minimal pain.  Objective: Vital signs in last 24 hours: Temp:  [97.6 F (36.4 C)-98.4 F (36.9 C)] 98.4 F (36.9 C) (12/07 0548) Pulse Rate:  [69-87] 74  (12/07 0635) Resp:  [15-18] 16  (12/07 0548) BP: (174-190)/(69-89) 189/76 mmHg (12/07 0635) SpO2:  [96 %-97 %] 97 % (12/07 0548)   Intake/Output from previous day: 12/06 0701 - 12/07 0700 In: 3636.8 [P.O.:2345; I.V.:1291.8] Out: 2400 [Urine:2400] Intake/Output this shift: Total I/O In: 410 [I.V.:410] Out: -    General appearance: alert, cooperative and no distress Resp: clear to auscultation bilaterally Cardio: regular rate and rhythm, S1, S2 normal, no murmur, click, rub or gallop GI: soft, nontender, possibly mildly distended. Bowel sounds present  Incision: healing well  Lab Results:  No results found for this basename: WBC:2,HGB:2,HCT:2,PLT:2 in the last 72 hours BMET  Basename 02/17/12 0500 02/16/12 0500  NA 138 140  K 4.1 3.4*  CL 103 104  CO2 24 24  GLUCOSE 106* 130*  BUN 8 9   CREATININE 0.38* 0.34*  CALCIUM 9.4 9.5   PT/INR No results found for this basename: LABPROT:2,INR:2 in the last 72 hours ABG No results found for this basename: PHART:2,PCO2:2,PO2:2,HCO3:2 in the last 72 hours  MEDS, Scheduled    . antiseptic oral rinse  15 mL Mouth Rinse q12n4p  . chlorhexidine  15 mL Mouth Rinse BID  . feeding supplement  1 Container Oral BID BM  . fentaNYL  50 mcg Transdermal Q72H  . heparin  5,000 Units Subcutaneous Q8H  . insulin aspart  0-9 Units Subcutaneous TID AC & HS  . [COMPLETED] magnesium sulfate 1 - 4 g bolus IVPB  2 g Intravenous Once  . nicotine  21 mg Transdermal Daily  . [COMPLETED] potassium chloride  10 mEq Intravenous Q1 Hr x 5  . [DISCONTINUED] insulin aspart  0-9 Units Subcutaneous Q4H    Studies/Results: No results found.    LOS: 9 days     Currie Paris, MD, [facility], [place] [phone]   02/17/2012 9:38 AM

## 2012-02-18 LAB — GLUCOSE, CAPILLARY: Glucose-Capillary: 94 mg/dL (ref 70–99)

## 2012-02-18 MED ORDER — EFAVIRENZ-EMTRICITAB-TENOFOVIR 600-200-300 MG PO TABS
1.0000 | ORAL_TABLET | Freq: Every day | ORAL | Status: DC
  Administered 2012-02-19: 1 via ORAL
  Filled 2012-02-18 (×3): qty 1

## 2012-02-18 MED ORDER — IBUPROFEN 400 MG PO TABS
400.0000 mg | ORAL_TABLET | ORAL | Status: DC | PRN
  Filled 2012-02-18: qty 1

## 2012-02-18 MED ORDER — OXYCODONE-ACETAMINOPHEN 5-325 MG PO TABS
1.0000 | ORAL_TABLET | ORAL | Status: DC | PRN
  Administered 2012-02-18 – 2012-02-19 (×4): 2 via ORAL
  Filled 2012-02-18 (×4): qty 2

## 2012-02-18 MED ORDER — AMLODIPINE BESYLATE 5 MG PO TABS
5.0000 mg | ORAL_TABLET | Freq: Every day | ORAL | Status: DC
  Administered 2012-02-18 – 2012-02-19 (×2): 5 mg via ORAL
  Filled 2012-02-18 (×2): qty 1

## 2012-02-18 NOTE — Progress Notes (Signed)
SBO (small bowel obstruction)  Assessment: SBO (small bowel obstruction) Continues to improve from a surgical standpoint postop lysis of adhesions  Plan: Will increase diet today to low residue. If this is tolerated she can likely go home tomorrow on a regular diet.   Subjective: She says she feels better today. She is tolerating full liquid diet without problems. She is passing gas and having bowel movements. She is not have any nausea or any significant abdominal pain.  Objective: Vital signs in last 24 hours: Temp:  [98.5 F (36.9 C)-99.1 F (37.3 C)] 98.6 F (37 C) (12/08 0555) Pulse Rate:  [72-85] 85  (12/08 0555) Resp:  [16-20] 20  (12/08 0555) BP: (171-190)/(55-74) 171/55 mmHg (12/08 0555) SpO2:  [95 %-96 %] 95 % (12/08 0555) Last BM Date: 02/06/12  Intake/Output from previous day: 12/07 0701 - 12/08 0700 In: 410 [I.V.:410] Out: 1201 [Urine:1200; Stool:1] Intake/Output this shift: Total I/O In: 10 [I.V.:10] Out: -   General appearance: alert, cooperative and no distress Resp: clear to auscultation bilaterally Cardio: regular rate and rhythm, S1, S2 normal, no murmur, click, rub or gallop Abdomen: The abdomen is soft and not distended. Nontender. Bowel sounds are active.incision healing nicely.  Lab Results:  Results for orders placed during the hospital encounter of 02/08/12 (from the past 24 hour(s))  GLUCOSE, CAPILLARY     Status: Normal   Collection Time   02/17/12 12:10 PM      Component Value Range   Glucose-Capillary 89  70 - 99 mg/dL   Comment 1 Notify RN    GLUCOSE, CAPILLARY     Status: Abnormal   Collection Time   02/17/12  5:20 PM      Component Value Range   Glucose-Capillary 101 (*) 70 - 99 mg/dL   Comment 1 Notify RN    GLUCOSE, CAPILLARY     Status: Abnormal   Collection Time   02/17/12  9:48 PM      Component Value Range   Glucose-Capillary 113 (*) 70 - 99 mg/dL   Comment 1 Documented in Chart     Comment 2 Notify RN    GLUCOSE,  CAPILLARY     Status: Normal   Collection Time   02/18/12  8:08 AM      Component Value Range   Glucose-Capillary 81  70 - 99 mg/dL   Comment 1 Notify RN       Studies/Results Radiology     MEDS, Scheduled    . antiseptic oral rinse  15 mL Mouth Rinse q12n4p  . benazepril  40 mg Oral Daily  . chlorhexidine  15 mL Mouth Rinse BID  . feeding supplement  1 Container Oral BID BM  . fentaNYL  50 mcg Transdermal Q72H  . heparin  5,000 Units Subcutaneous Q8H  . insulin aspart  0-9 Units Subcutaneous TID AC & HS  . nicotine  21 mg Transdermal Daily       LOS: 10 days    Currie Paris, MD, [facility], [place] [phone]   02/18/2012 8:35 AM

## 2012-02-18 NOTE — Progress Notes (Signed)
Subjective: Feels much improved.  BM last night.  Slept well with good pain mgmt (recieved VI and PO pain medication at the same time); no nausea/vomiting/chills/sob  Objective: Vital signs in last 24 hours: Filed Vitals:   02/17/12 1512 02/17/12 2201 02/18/12 0555 02/18/12 1052  BP: 184/74 190/68 171/55 190/82  Pulse: 72 78 85   Temp: 99.1 F (37.3 C) 98.5 F (36.9 C) 98.6 F (37 C)   TempSrc: Oral Oral Oral   Resp: 16 19 20    Height:      Weight:      SpO2: 96% 96% 95%    Weight change:   Intake/Output Summary (Last 24 hours) at 02/18/12 1142 Last data filed at 02/18/12 1028  Gross per 24 hour  Intake    250 ml  Output   1201 ml  Net   -951 ml   General: resting in bed, no acute distress  HEENT: PERRL, EOMI, no scleral icterus  Cardiac: RRR, no rubs, murmurs or gallops  Pulm: clear to auscultation bilaterally, moving normal volumes of air  Abd: soft, nontender, nondistended, BS normoactive; incision line is clean/nonbloody without drainage and staples are intact  Ext: warm and well perfused, no pedal edema  Neuro: alert and oriented X3  Lab Results: Basic Metabolic Panel:  Lab 02/17/12 1191 02/16/12 0500 02/15/12 0545  NA 138 140 --  K 4.1 3.4* --  CL 103 104 --  CO2 24 24 --  GLUCOSE 106* 130* --  BUN 8 9 --  CREATININE 0.38* 0.34* --  CALCIUM 9.4 9.5 --  MG 1.8 1.8 --  PHOS -- 3.4 2.7   Liver Function Tests:  Lab 02/15/12 0545 02/13/12 0500  AST 13 18  ALT 8 9  ALKPHOS 80 69  BILITOT 0.3 0.2*  PROT 5.9* 5.8*  ALBUMIN 2.3* 2.6*   CBC:  Lab 02/13/12 0500 02/12/12 1907  WBC 5.2 5.1  NEUTROABS 3.7 --  HGB 14.4 13.4  HCT 40.3 37.7  MCV 92.9 94.0  PLT 305 268   CBG:  Lab 02/18/12 0808 02/17/12 2148 02/17/12 1720 02/17/12 1210 02/17/12 0753 02/16/12 2151  GLUCAP 81 113* 101* 89 104* 105*   Fasting Lipid Panel:  Lab 02/13/12 0500  CHOL 86  HDL --  LDLCALC --  TRIG 51  CHOLHDL --  LDLDIRECT --   Urinalysis:  Lab 02/13/12 0340   COLORURINE YELLOW  LABSPEC 1.012  PHURINE 7.0  GLUCOSEU 100*  HGBUR SMALL*  BILIRUBINUR NEGATIVE  KETONESUR NEGATIVE  PROTEINUR NEGATIVE  UROBILINOGEN 0.2  NITRITE NEGATIVE  LEUKOCYTESUR NEGATIVE    Micro Results: Recent Results (from the past 240 hour(s))  SURGICAL PCR SCREEN     Status: Normal   Collection Time   02/12/12 12:48 PM      Component Value Range Status Comment   MRSA, PCR NEGATIVE  NEGATIVE Final    Staphylococcus aureus NEGATIVE  NEGATIVE Final   CULTURE, BLOOD (ROUTINE X 2)     Status: Normal (Preliminary result)   Collection Time   02/13/12  3:30 AM      Component Value Range Status Comment   Specimen Description BLOOD LEFT ARM   Final    Special Requests BOTTLES DRAWN AEROBIC AND ANAEROBIC 5CC EA   Final    Culture  Setup Time 02/13/2012 08:57   Final    Culture     Final    Value:        BLOOD CULTURE RECEIVED NO GROWTH TO DATE CULTURE WILL BE  HELD FOR 5 DAYS BEFORE ISSUING A FINAL NEGATIVE REPORT   Report Status PENDING   Incomplete   URINE CULTURE     Status: Normal   Collection Time   02/13/12  3:40 AM      Component Value Range Status Comment   Specimen Description URINE, CATHETERIZED   Final    Special Requests NONE   Final    Culture  Setup Time 02/13/2012 04:22   Final    Colony Count NO GROWTH   Final    Culture NO GROWTH   Final    Report Status 02/14/2012 FINAL   Final   CULTURE, BLOOD (ROUTINE X 2)     Status: Normal (Preliminary result)   Collection Time   02/13/12  3:45 AM      Component Value Range Status Comment   Specimen Description BLOOD LEFT ARM   Final    Special Requests BOTTLES DRAWN AEROBIC AND ANAEROBIC 4CC EA   Final    Culture  Setup Time 02/13/2012 08:57   Final    Culture     Final    Value:        BLOOD CULTURE RECEIVED NO GROWTH TO DATE CULTURE WILL BE HELD FOR 5 DAYS BEFORE ISSUING A FINAL NEGATIVE REPORT   Report Status PENDING   Incomplete    Studies/Results: No results found. Medications: I have reviewed the  patient's current medications. Scheduled Meds:   . antiseptic oral rinse  15 mL Mouth Rinse q12n4p  . benazepril  40 mg Oral Daily  . chlorhexidine  15 mL Mouth Rinse BID  . feeding supplement  1 Container Oral BID BM  . fentaNYL  50 mcg Transdermal Q72H  . heparin  5,000 Units Subcutaneous Q8H  . insulin aspart  0-9 Units Subcutaneous TID AC & HS  . nicotine  21 mg Transdermal Daily   Continuous Infusions:   . [DISCONTINUED] 0.9 % NaCl with KCl 20 mEq / L 55 mL/hr at 02/17/12 1151   PRN Meds:.diphenhydrAMINE, diphenhydrAMINE, diphenhydrAMINE, hydrALAZINE, HYDROmorphone (DILAUDID) injection, naloxone, ondansetron (ZOFRAN) IV, oxyCODONE, phenol, sodium chloride, sodium chloride Assessment/Plan: 71 yo woman with h/o HIV, HTN and previous abdominal surgeries was admitted on 02/08/12 for   # Partial small bowel obstruction, symptomatic: h/o of multiple abdominal surgeries and is s/p small bowel resection 10/12; CT scan revealed mild distal small bowel obstruction with transition in the mid pelvis likely due to adhesions. Last colonoscopy 5/12 with extensive left sided diverticula. S/p ex lap with LOA yesterday--POD #6  - Surgery following-- BM yesterday, mobilize and pulmonary toilet - diet advanced to low residue  - Continue pain mgmt: d/c IV pain medication in anticipation for d/c home; fentanyl patch ( ), percocet 1-2 tab q3hprn, ibpurofen 400 q4hprn - zofran prn nausea (has not required zofran in 24h)   # HIV disease--well controlled: CD4 = 680 & HIV RNA Quant <20 02/08/12. Followed by Dr. 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 Brunilda Payor with Urology.   #  Tobacco abuse: treatment with nicotine patch during hospitalization   Diet: Low residue DVT PPX: [place] Heparin  Dispo: Disposition is deferred at this time, awaiting clinical improvement.  The patient does have a current PCP [name], MD), therefore will be requiring OPC follow-up after discharge.  The patient does not have transportation limitations that hinder transportation to clinic appointments.    .Services Needed at time of discharge: Y = Yes, Blank = No PT:   OT:   RN:   Equipment:   Other:     LOS: 10 days   KAPADIA, Karesha Trzcinski 02/18/2012, 11:42 AM

## 2012-02-19 ENCOUNTER — Encounter (INDEPENDENT_AMBULATORY_CARE_PROVIDER_SITE_OTHER): Payer: Medicare Other

## 2012-02-19 ENCOUNTER — Telehealth: Payer: Self-pay | Admitting: *Deleted

## 2012-02-19 DIAGNOSIS — Z720 Tobacco use: Secondary | ICD-10-CM

## 2012-02-19 DIAGNOSIS — F17200 Nicotine dependence, unspecified, uncomplicated: Secondary | ICD-10-CM | POA: Diagnosis present

## 2012-02-19 HISTORY — DX: Tobacco use: Z72.0

## 2012-02-19 LAB — CULTURE, BLOOD (ROUTINE X 2): Culture: NO GROWTH

## 2012-02-19 MED ORDER — NICOTINE 21 MG/24HR TD PT24: 1.0000 | MEDICATED_PATCH | Freq: Every day | TRANSDERMAL | Status: DC

## 2012-02-19 MED ORDER — IBUPROFEN 400 MG PO TABS: 400.0000 mg | ORAL_TABLET | ORAL | Status: DC | PRN

## 2012-02-19 MED ORDER — HYDROCODONE-ACETAMINOPHEN 10-325 MG PO TABS: 1.0000 | ORAL_TABLET | Freq: Four times a day (QID) | ORAL | Status: DC | PRN

## 2012-02-19 MED ORDER — AMLODIPINE BESYLATE 5 MG PO TABS: 5.0000 mg | ORAL_TABLET | Freq: Every day | ORAL | Status: DC

## 2012-02-19 NOTE — Discharge Summary (Signed)
Internal Medicine Teaching [facility] Discharge Note  Name: Jessica [name] MRN: [id number] DOB: 08-12-1940 71 y.o.  Date of Admission: 02/08/2012  4:42 AM Date of Discharge: 02/19/2012 Attending Physician: Lars Mage, MD  Discharge Diagnosis: Principal Problem:  *SBO (small bowel obstruction), recurrent Active Problems:  Tobacco abuse  HIV INFECTION  HYPERTENSION  Chronic pain syndrome  Voiding dysfunction  Discharge Medications:   Medication List     As of 02/19/2012  9:57 AM    STOP taking these medications         HYDROcodone-acetaminophen 10-650 MG per tablet   Commonly known as: LORCET      TAKE these medications         acetaminophen 500 MG tablet   Commonly known as: TYLENOL   Take 500 mg by mouth every 4 (four) hours as needed. For pain      acyclovir 400 MG tablet   Commonly known as: ZOVIRAX   Take 1 tablet (400 mg total) by mouth 3 (three) times daily. as needed for herpes outbreak      ALPRAZolam 1 MG tablet   Commonly known as: XANAX   Take 1 mg by mouth 3 (three) times daily as needed. For anxiety      amLODipine 5 MG tablet   Commonly known as: NORVASC   Take 1 tablet (5 mg total) by mouth daily.      aspirin 81 MG EC tablet   Take 81 mg by mouth daily.      benazepril 40 MG tablet   Commonly known as: LOTENSIN   Take 40 mg by mouth at bedtime.      cyclobenzaprine 10 MG tablet   Commonly known as: FLEXERIL   Take 1 tablet (10 mg total) by mouth 3 (three) times daily as needed. For spasms      efavirenz-emtricitabine-tenofovir 600-200-300 MG per tablet   Commonly known as: ATRIPLA   Take 1 tablet by mouth at bedtime.      ENSURE   Take 237 mLs by mouth 2 (two) times daily.      fentaNYL 50 MCG/HR   Commonly known as: DURAGESIC - dosed mcg/hr   Place 1 patch (50 mcg total) onto the skin every 3 (three) days.      HYDROcodone-acetaminophen 10-325 MG per tablet   Commonly known as: NORCO   Take 1 tablet by mouth every 6  (six) hours as needed for pain.      ibuprofen 400 MG tablet   Commonly known as: ADVIL,MOTRIN   Take 1 tablet (400 mg total) by mouth every 4 (four) hours as needed for pain.      lovastatin 40 MG tablet   Commonly known as: MEVACOR   Take 1 tablet (40 mg total) by mouth at bedtime.      multivitamin with minerals Tabs   Take 1 tablet by mouth daily.      nicotine 21 mg/24hr patch   Commonly known as: NICODERM CQ - dosed in mg/24 hours   Place 1 patch onto the skin daily.      senna-docusate 8.6-50 MG per tablet   Commonly known as: Senokot-S   Take 2 tablets by mouth 2 (two) times daily.      Travoprost (BAK Free) 0.004 % Soln ophthalmic solution   Commonly known as: TRAVATAN   Place 1 drop into both eyes at bedtime.      triamcinolone ointment 0.1 %   Commonly known as: KENALOG   Apply 1  application topically 2 (two) times daily.         Disposition and follow-up:   Ms.Jamella O Wilkerson-Manns was discharged from [facility] in stable condition.  At the hospital follow up visit please address: SBO: abdominal pain and surgery follow up, tolerating regular diet? Consider rechecking bmet for K follow up, low no normal after NGT discontinued.   HTN: started on amlodipine this admission and continued Benazepril, BP still high, will need to be rechecked and titrate medications as needed HIV: f/u with Dr. Maurice March in ID clinic, restarted home meds on discharge  Follow-up Appointments: Follow-up Information    Follow up with [facility], MD. On 02/23/2012. (Nurse visit for staple removal at 10:45 AM. They will schedule appt with Dr. Donell Beers at that time for later date. )    Contact information:   [address] [place] [address] [phone]       Follow up with KALIA-REYNOLDS, SHELLY, DO. On 03/04/2012. (@3pm  )    Contact information:   [address] [place] [address] [phone]         Discharge Orders    Future  Appointments: Provider: Department: Dept Phone: Center:   02/23/2012 11:00 AM [facility] Surgery Nurse [facility] [place] [phone] None   03/04/2012 3:00 PM Priscella Mann, DO [facility] [phone] [facility]   04/04/2012 9:00 AM [facility] [phone] [facility]   04/19/2012 11:00 AM [name], MD [facility] for Infectious Disease [phone] [facility]     Future Orders Please Complete By Expires   Diet - low sodium heart healthy      Increase activity slowly      Call MD for:  temperature >100.4      Call MD for:  persistant nausea and vomiting      Call MD for:  severe uncontrolled pain        Consultations:Surgery Treatment Team:  Bishop Limbo, MD  Procedures Performed:  Dg Chest 2 View  02/03/2012  *RADIOLOGY REPORT*  Clinical Data: Chest pain, shortness of breath and cough.  CHEST - 2 VIEW  Comparison: 12/15/2010  Findings: Stable appearance of chronic lung disease.  No edema, pneumothorax, infiltrate or nodules detected.  No evidence of pleural effusion.  The heart size and mediastinal contours within normal limits.  The bony thorax shows mild degenerative changes of the spine and scoliosis.  IMPRESSION: Stable chronic lung disease.  No acute findings.   Original Report Authenticated By: Irish Lack, M.D.    Dg Shoulder Right  02/03/2012  *RADIOLOGY REPORT*  Clinical Data: Recent fall with right shoulder pain.  RIGHT SHOULDER - 2+ VIEW  Comparison: None.  Findings: No acute fracture or dislocation.  Mild degenerative changes are seen involving the humeral head.  The [facility] joint shows normal alignment.  No soft tissue abnormalities.  No bony lesions or obstruction.  IMPRESSION: No acute fracture or dislocation.   Original Report Authenticated By: Irish Lack, M.D.    Dg Abd 1 View  02/09/2012  *RADIOLOGY REPORT*  Clinical Data: Follow-up small bowel obstruction  ABDOMEN - 1 VIEW   Comparison: The 02/08/2012 CT.  Findings: Mild gaseous distention of the mid abdominal small bowel loops.  Prior CT, many of these are fluid filled and likely it difficult to visualize by plain film.  The large stool burden throughout the colon.  No free air.  IMPRESSION: Continued mild prominence  of mid abdominal small bowel loops.  No real change from scout film of recent CT.   Original Report Authenticated By: Charlett Nose, M.D.    Ct Angio Chest Pe W/cm &/or Wo Cm  02/03/2012  *RADIOLOGY REPORT*  Clinical Data: Left-sided chest pain.  CT ANGIOGRAPHY CHEST  Technique:  Multidetector CT imaging of the chest using the standard protocol during bolus administration of intravenous contrast. Multiplanar reconstructed images including MIPs were obtained and reviewed to evaluate the vascular anatomy.  Contrast: 65mL OMNIPAQUE IOHEXOL 350 MG/ML SOLN  Comparison: Chest x-ray earlier today.  Findings: The pulmonary arteries are adequately opacified.  There is no evidence of pulmonary embolism.  Lungs show scattered atelectasis and scarring as well as chronic lung disease bilaterally.  Calcified coronary atherosclerotic plaque is noted in the distribution of the LAD.  There likely also is calcification associated with the left circumflex coronary artery which is less well defined due to motion artifact.  Overall heart size is normal.  No pleural or pericardial effusion.  No enlarged lymph nodes or focal pulmonary masses are identified.  The visualized airway is normally patent.  Upper abdomen is unremarkable.  The spine demonstrates degenerative changes as well as kyphosis without evidence of compression fracture or bony lesions.  IMPRESSION:  1.  No evidence of acute pulmonary embolism. 2.  Chronic lung disease with scattered areas of parenchymal scarring and atelectasis bilaterally. 3.  Coronary atherosclerosis with calcified plaque noted in the distribution of the LAD and also suspected in the distribution of the left  circumflex coronary artery.   Original Report Authenticated By: Irish Lack, M.D.    Ct Abdomen Pelvis W Contrast  02/08/2012  *RADIOLOGY REPORT*  Clinical Data: Lower abdominal pain and vomiting.  CT ABDOMEN AND PELVIS WITH CONTRAST  Technique:  Multidetector CT imaging of the abdomen and pelvis was performed following the standard protocol during bolus administration of intravenous contrast.  Contrast: 80mL OMNIPAQUE IOHEXOL 300 MG/ML  SOLN  Comparison: CT scan 12/17/2010.  Findings: The lung bases are clear except for dependent atelectasis.  The liver demonstrates stable intra hepatic biliary dilatation likely due to prior cholecystectomy.  The common bile duct is also mildly dilated.  No focal hepatic lesions.  The spleen is normal in size.  No focal lesions.  The pancreas is unremarkable.  The adrenal glands and kidneys are normal.  The stomach is distended with contrast.  No gross abnormalities. The duodenum is normal.  The proximal and mid small bowel is dilated.  There is a transition in the mid pelvis to normal/decompressed small bowel loops.  Findings consistent with a mid distal small bowel obstruction due to adhesions.  The colon is unremarkable except for scattered diverticulosis.  There are surgical changes involving the mid small bowel in the left lower quadrant.  The aorta demonstrates moderate atherosclerotic changes.  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  No complicating features.  No mesenteric or retroperitoneal mass or adenopathy.  The bladder is unremarkable.  The uterus is surgically absent.  No pelvic mass, adenopathy or significant free pelvic fluid collection.  No inguinal mass or hernia.  The bony structures are intact.  IMPRESSION: Mid distal small bowel obstruction with transition in the mid pelvis likely due to adhesions.   Original Report Authenticated By: Rudie Meyer, M.D.    Dg Chest Port 1 View  02/13/2012  *RADIOLOGY REPORT*  Clinical Data: Postop fever, weakness  PORTABLE  CHEST - 1 VIEW  Comparison: Portable chest x-ray of 02/12/2012  Findings: There is mild bibasilar linear atelectasis present.  No focal infiltrate or effusion is seen. A right PICC line is present with the tip overlying the mid upper SVC.  No pneumothorax is noted.  An NG tube extends below the hemidiaphragm.  IMPRESSION:  1.  Right PICC line tip overlies upper SVC.  No pneumothorax. 2.  Mild bibasilar linear atelectasis.   Original Report Authenticated By: Dwyane Dee, M.D.    Dg Chest Port 1 View  02/12/2012  *RADIOLOGY REPORT*  Clinical Data: Preop for exploratory laparotomy.  Left-sided chest pain.  PORTABLE CHEST - 1 VIEW  Comparison: Two-view chest 02/03/2012.  Findings: The heart size is normal.  Mild interstitial coarsening is chronic.  The lung volumes are slightly decreased relative to the prior exam.  Minimal right basilar airspace opacification is present.  No other airspace consolidation is present.  An NG tube is in place.  The visualized soft tissues and bony thorax are unremarkable apart from cholecystectomy clips.  IMPRESSION:  1.  Decreased lung volumes with minimal right basilar airspace disease.  While this likely reflects atelectasis, early infection is not excluded. 2.  Stable chronic interstitial coarsening.   Original Report Authenticated By: Marin Roberts, M.D.    Dg Abd 2 Views  02/12/2012  *RADIOLOGY REPORT*  Clinical Data: Small bowel obstruction, follow-up.  ABDOMEN - 2 VIEW  Comparison: 02/11/2012  Findings: Dilated loops of small bowel with air-fluid levels are consistent with a high grade partial small bowel obstruction.  Air and stool are seen within a normal caliber colon.  Bowel anastomosis staples project in the left lower quadrant and surgical vascular clips project in the right upper quadrant reflecting a prior cholecystectomy.  There is opacity at the right lung base that may reflect atelectasis or infiltrate.  No free air.  A nasogastric tube has its tip in the  proximal stomach, which has been inserted further since the prior study.  IMPRESSION: High grade partial small bowel obstruction similar to the previous day's study.  No free air.  Nasogastric tube now has its tip within the stomach.   Original Report Authenticated By: Amie Portland, M.D.    Dg Abd 2 Views  02/11/2012  *RADIOLOGY REPORT*  Clinical Data: Abdominal pain, small bowel obstruction  ABDOMEN - 2 VIEW  Comparison: Prior radiograph 02/09/2012  Findings: Progressive gaseous dilatation of small bowel in the mid abdomen. A linear opacity is incompletely imaged projecting over the mid thoracic spine.  If this represents a nasogastric tube, it is in the mid thoracic esophagus.  Maximal small bowel diameter measures up to 5.5 cm.  No free air on the upright view although there are multiple air-fluid levels.  Surgical clips noted the right upper quadrant and left lower abdomen.  Patchy opacities noted in the right lung base.  IMPRESSION:  1.  Interval progression of small bowel obstruction.  Maximal small bowel diameter measures 4.9 cm on the upright view, and 5.5 cm on the supine view.  2. Partially imaged linear opacity projects over the mid thoracic spine.  If this represents the marker of a nasogastric tube, the tip of the tube is in the mid thoracic esophagus.  3.  Patchy opacities in the right lung base may reflect atelectasis, consolidation or potentially aspiration in the appropriate clinical setting.  If clinically warranted, consider dedicated chest x-ray to further evaluate.   Original Report Authenticated By: Malachy Moan, M.D.    Dg Abd Portable 1v  02/11/2012  *RADIOLOGY REPORT*  Clinical  Data: Nasogastric tube placement at bedside.  PORTABLE ABDOMEN - 1 VIEW 02/11/2012 1046 hours:  Comparison: Two-view abdomen x-ray earlier same date 0606 hours.  Findings: Nasogastric tube tip in the distal esophagus near the esophagogastric junction.  This should be advanced several centimeters.  Multiple  dilated loops of small bowel throughout the abdomen, unchanged.  No suggestion of free air on the supine image.  IMPRESSION:  1.  Nasogastric tube tip projects over the distal esophagus just above the esophagogastric junction.  This should be advanced several centimeters. 2.  Small bowel obstruction.  These results were called by telephone on 02/11/2012 at 1102 hours to Lauren, the nurse caring for the patient, who verbally acknowledged these results.   Original Report Authenticated By: Hulan Saas, M.D.    Admission HPI: 71yo F with a h/o HIV, HTN, and previous abdominal surgeries resulting in SOB, s/p bowel resection 10/12, presents today with sharp abdominal pain. She states that she woke up on Tuesday at 2am with sharp abdominal pain located around her umbilicus and left flank pain. She had one episode of brown clear liquid emesis in the ED, but denies any other episodes. Her last bowel movement was 2 days ago; she denies flatus. She states that her sx are similar to her previous bowel obstruction but less severe.  Of note, she was recently seen in the ED on 11/26 for E. Coli positive UTI, and was treated with abx. Per pt, her urinary sx have resolved but she does c/o left flank pain.  Hospital Course by problem list:  SBO (small bowel obstruction), recurrent--presented with complaints of severe abdominal pain associated vomiting.  Hx of previous abdominal surgeries and SBO.  S/p bowel resection in 2012.  CT abdomen on admission revealed mid distal small bowel obstruction with transition in the mid pelvis likely due to adhesions.  Surgery was consulted.  Bowel rest and NGT tube was initiated, pain continued with no improvement.  She was taken to the OR by surgery on 12/2/3 for Exploratory laparotomy with lysis of adhesions.  During her hospital course, her abdominal pain slowly improved and she was able to tolerate diet as it was advanced slowly.  On discharge, she is POD# 7.  She will be discharged on  PRN pain medication for two weeks (Norco).  She will follow up with surgery as an outpatient and will also have hospital follow up in [facility].     HIV INFECTION--well controlled: CD4 = 680 & HIV RNA Quant <20 02/08/12. Followed by Dr. Maurice March in ID clinic.  On home regimen of Atripla with Acyclovir for herpes outbreak,  Initially held anti-retrovirals, but reinitiated during hospital course.  Will be discharged on home medication and follow up in ID clinic as normally scheduled.     HYPERTENSION--uncontrolled.  Was on Lotensin 40mg  daily at home.  BP medications were held initially but restarted Lotensin and also started Norvasc 5mg  PO QD.  Prior records show systolic BP range 150-180.  Will need to follow up in opc as outpatient and recheck blood pressure and adjust medications with titration as needed.  Of note, she claims she has tried HCTZ in the past but reported syncopal episodes while on the medication and was discontinued.     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  On flexeril prn for spasm, tylenol prn for pain, lorcet, and fentanyl patch at home.  Back pain stable during hospital course.  Discharged on Norco 10-325mg  1 tablet q6h prn abdominal pain  for two weeks.     Voiding dysfunction--S/p meatal dilation and cystoscopy in '05 per Dr. Brunilda Payor with Urology. Stable and back to baseline during hospital course.    Tobacco abuse: active smoker ~1/2ppd.  Treatment with nicotine patch during hospitalization and prescribed on discharge.  Cessation strongly advised, counseling given.  She is willing to stop smoking and will try.  Referred to [id number] as well.  Follow up with pcp.    Discharge Vitals:  BP 181/57  Pulse 80  Temp 98.4 F (36.9 C) (Oral)  Resp 20  Ht 5\' 7"  (1.702 m)  Wt 125 lb 7.1 oz (56.9 kg)  BMI 19.65 kg/m2  SpO2 98%  Discharge Labs:  Results for orders placed during the hospital encounter of 02/08/12 (from the past 24 hour(s))  GLUCOSE,  CAPILLARY     Status: Normal   Collection Time   02/18/12 12:04 PM      Component Value Range   Glucose-Capillary 94  70 - 99 mg/dL   Comment 1 Notify RN     Signed: Darden Palmer 02/19/2012, 9:57 AM   Time Spent on Discharge: 40 minutes

## 2012-02-19 NOTE — Progress Notes (Signed)
Internal Medicine Teaching Service Attending Note Date: 02/19/2012  Patient name: Jessica [name]  Medical record number: [id number]  Date of birth: May 31, 1940    This patient has been seen and discussed with the house staff. Please see their note for complete details. I concur with their findings with the following additions/corrections: Patient was eating her breakfast time of my visit. She seems to be ready to be discharged home today. Patient will followup with surgery and her primary care physician.  Lars Mage 02/19/2012, 1:46 PM

## 2012-02-19 NOTE — Progress Notes (Signed)
Seen and agree.  Home today.

## 2012-02-19 NOTE — Progress Notes (Signed)
7 Days Post-Op  Subjective: Doing well this AM. Pain controlled on percocet. Tolerated low fiber diet with no nausea vomiting. Continues to pass gas and had BM yesterday.   Objective: Vital signs in last 24 hours: Temp:  [98.4 F (36.9 C)-98.6 F (37 C)] 98.4 F (36.9 C) (12/09 0515) Pulse Rate:  [76-89] 80  (12/09 0515) Resp:  [19-20] 20  (12/09 0515) BP: (142-190)/(57-82) 181/57 mmHg (12/09 0515) SpO2:  [98 %-100 %] 98 % (12/09 0515) Last BM Date: 02/06/12  Intake/Output from previous day: 12/08 0701 - 12/09 0700 In: 380 [P.O.:360; I.V.:20] Out: 2050 [Urine:2050] Intake/Output this shift:    PE: Gener[place]l: alert, cooperative, NAD, smiling CV: RRR, no rmg Lungs: CTAB, normal respiratory effort Abd: soft, appropriately tender, nondistended, bowel sounds normal. Midline incision c/d/i.   Lab Results:  No results found for this basename: WBC:2,HGB:2,HCT:2,PLT:2 in the last 72 hours BMET  Basename 02/17/12 0500  NA 138  K 4.1  CL 103  CO2 24  GLUCOSE 106*  BUN 8  CREATININE 0.38*  CALCIUM 9.4   PT/INR No results found for this basename: LABPROT:2,INR:2 in the last 72 hours CMP     Component Value Date/Time   NA 138 02/17/2012 0500   K 4.1 02/17/2012 0500   CL 103 02/17/2012 0500   CO2 24 02/17/2012 0500   GLUCOSE 106* 02/17/2012 0500   BUN 8 02/17/2012 0500   CREATININE 0.38* 02/17/2012 0500   CREATININE 0.62 10/20/2011 1006   CALCIUM 9.4 02/17/2012 0500   PROT 5.9* 02/15/2012 0545   ALBUMIN 2.3* 02/15/2012 0545   AST 13 02/15/2012 0545   ALT 8 02/15/2012 0545   ALKPHOS 80 02/15/2012 0545   BILITOT 0.3 02/15/2012 0545   GFRNONAA >90 02/17/2012 0500   GFRAA >90 02/17/2012 0500   Lipase     Component Value Date/Time   LIPASE 36 02/08/2012 0508       Studies/Results: No results found.  Anti-infectives: Anti-infectives     Start     Dose/Rate Route Frequency Ordered Stop   02/18/12 2200  efavirenz-emtricitabine-tenofovir (ATRIPLA) 600-200-300 MG per tablet 1  tablet       1 tablet Oral Daily at bedtime 02/18/12 1148     02/13/12 0600   cefOXitin (MEFOXIN) 2 g in dextrose 5 % 50 mL IVPB  Status:  Discontinued        2 g 100 mL/hr over 30 Minutes Intravenous 4 times per day 02/13/12 0414 02/13/12 1312   02/12/12 0900   cefOXitin (MEFOXIN) 2 g in dextrose 5 % 50 mL IVPB     Comments: On call to OR      2 g 100 mL/hr over 30 Minutes Intravenous  Once 02/12/12 0853 02/12/12 1345           Assessment/Plan 1. Small Bowel Obstruction s/p lysis of adhesions POD  #7 2. Chronic Pain syndrome   Plan: Will advance to regular diet at discharge Patient stable for discharge today from surgical perspective (will schedule follow up and place in discharge instructions)  Other medical problems including HTN being managed by primary team: SBP still poorly controlled with medications being titrated per primary team   LOS: 11 days    Jessica [name] 02/19/2012, 8:23 AM

## 2012-02-19 NOTE — Progress Notes (Signed)
Subjective: [name] was seen and examined at bedside. She is post op day #7 of ex lap with LOA.  She tolerated low residue diet yesterday without any nausea or vomiting but did have abdominal pain that was well controlled with percocet.  She continues to pass gas and have BMs.  She is hopeful to go home today and is stable for discharge as per surgery as well.  She denies any other complaints at this time.  No headaches, N/V/D, chest pain, or shortness of breath.    Objective: Vital signs in last 24 hours: Filed Vitals:   02/18/12 1052 02/18/12 1408 02/18/12 2152 02/19/12 0515  BP: 190/82 179/63 142/63 181/57  Pulse:  89 76 80  Temp:  98.6 F (37 C) 98.4 F (36.9 C) 98.4 F (36.9 C)  TempSrc:  Oral Oral   Resp:  20 19 20   Height:      Weight:      SpO2:  100% 98% 98%   Weight change:   Intake/Output Summary (Last 24 hours) at 02/19/12 0658 Last data filed at 02/19/12 0515  Gross per 24 hour  Intake    380 ml  Output   2050 ml  Net  -1670 ml   General: resting in bed, no acute distress  HEENT: PERRLA, EOMI, no scleral icterus  Cardiac: RRR, no rubs, murmurs or gallops  Pulm: clear to auscultation bilaterally, -wheezing Abd: soft, non-tender to light palpation, nondistended, BS normoactive; incision line is clean/nonbloody without drainage and staples are intact  Ext: warm and well perfused, no pedal edema  Neuro: alert and oriented X3  Lab Results: Basic Metabolic Panel:  Lab 02/17/12 1610 02/16/12 0500 02/15/12 0545  NA 138 140 --  K 4.1 3.4* --  CL 103 104 --  CO2 24 24 --  GLUCOSE 106* 130* --  BUN 8 9 --  CREATININE 0.38* 0.34* --  CALCIUM 9.4 9.5 --  MG 1.8 1.8 --  PHOS -- 3.4 2.7   Liver Function Tests:  Lab 02/15/12 0545 02/13/12 0500  AST 13 18  ALT 8 9  ALKPHOS 80 69  BILITOT 0.3 0.2*  PROT 5.9* 5.8*  ALBUMIN 2.3* 2.6*   CBC:  Lab 02/13/12 0500 02/12/12 1907  WBC 5.2 5.1  NEUTROABS 3.7 --  HGB 14.4 13.4  HCT 40.3 37.7  MCV 92.9  94.0  PLT 305 268   CBG:  Lab 02/18/12 1204 02/18/12 0808 02/17/12 2148 02/17/12 1720 02/17/12 1210 02/17/12 0753  GLUCAP 94 81 113* 101* 89 104*   Fasting Lipid Panel:  Lab 02/13/12 0500  CHOL 86  HDL --  LDLCALC --  TRIG 51  CHOLHDL --  LDLDIRECT --   Urinalysis:  Lab 02/13/12 0340  COLORURINE YELLOW  LABSPEC 1.012  PHURINE 7.0  GLUCOSEU 100*  HGBUR SMALL*  BILIRUBINUR NEGATIVE  KETONESUR NEGATIVE  PROTEINUR NEGATIVE  UROBILINOGEN 0.2  NITRITE NEGATIVE  LEUKOCYTESUR NEGATIVE   Micro Results: Recent Results (from the past 240 hour(s))  SURGICAL PCR SCREEN     Status: Normal   Collection Time   02/12/12 12:48 PM      Component Value Range Status Comment   MRSA, PCR NEGATIVE  NEGATIVE Final    Staphylococcus aureus NEGATIVE  NEGATIVE Final   CULTURE, BLOOD (ROUTINE X 2)     Status: Normal (Preliminary result)   Collection Time   02/13/12  3:30 AM      Component Value Range Status Comment   Specimen Description BLOOD LEFT ARM  Final    Special Requests BOTTLES DRAWN AEROBIC AND ANAEROBIC 5CC EA   Final    Culture  Setup Time 02/13/2012 08:57   Final    Culture     Final    Value:        BLOOD CULTURE RECEIVED NO GROWTH TO DATE CULTURE WILL BE HELD FOR 5 DAYS BEFORE ISSUING A FINAL NEGATIVE REPORT   Report Status PENDING   Incomplete   URINE CULTURE     Status: Normal   Collection Time   02/13/12  3:40 AM      Component Value Range Status Comment   Specimen Description URINE, CATHETERIZED   Final    Special Requests NONE   Final    Culture  Setup Time 02/13/2012 04:22   Final    Colony Count NO GROWTH   Final    Culture NO GROWTH   Final    Report Status 02/14/2012 FINAL   Final   CULTURE, BLOOD (ROUTINE X 2)     Status: Normal (Preliminary result)   Collection Time   02/13/12  3:45 AM      Component Value Range Status Comment   Specimen Description BLOOD LEFT ARM   Final    Special Requests BOTTLES DRAWN AEROBIC AND ANAEROBIC 4CC EA   Final    Culture   Setup Time 02/13/2012 08:57   Final    Culture     Final    Value:        BLOOD CULTURE RECEIVED NO GROWTH TO DATE CULTURE WILL BE HELD FOR 5 DAYS BEFORE ISSUING A FINAL NEGATIVE REPORT   Report Status PENDING   Incomplete    Studies/Results: No results found. Medications: I have reviewed the patient's current medications. Scheduled Meds:    . amLODipine  5 mg Oral Daily  . antiseptic oral rinse  15 mL Mouth Rinse q12n4p  . benazepril  40 mg Oral Daily  . chlorhexidine  15 mL Mouth Rinse BID  . efavirenz-emtricitabine-tenofovir  1 tablet Oral QHS  . feeding supplement  1 Container Oral BID BM  . fentaNYL  50 mcg Transdermal Q72H  . heparin  5,000 Units Subcutaneous Q8H  . nicotine  21 mg Transdermal Daily  . [DISCONTINUED] insulin aspart  0-9 Units Subcutaneous TID AC & HS   Continuous Infusions:  PRN Meds:.diphenhydrAMINE, diphenhydrAMINE, diphenhydrAMINE, hydrALAZINE, ibuprofen, naloxone, ondansetron (ZOFRAN) IV, oxyCODONE-acetaminophen, phenol, sodium chloride, sodium chloride, [DISCONTINUED]  HYDROmorphone (DILAUDID) injection, [DISCONTINUED] oxyCODONE Assessment/Plan: 71 yo woman with h/o HIV, HTN and previous abdominal surgeries was admitted on 02/08/12 for SBO.    # Partial small bowel obstruction, symptomatic: h/o of multiple abdominal surgeries and is s/p small bowel resection 10/12; CT scan revealed mild distal small bowel obstruction with transition in the mid pelvis likely due to adhesions. Last colonoscopy 5/12 with extensive left sided diverticula. S/p ex lap with LOA yesterday--POD #7  - Surgery following-- stable for discharge, regular diet for discharge.  Will follow up as outpatient - Continue pain mgmt: fentanyl patch ( ), percocet 1-2 tab q3hprn, ibpurofen 400 q4hprn - zofran prn nausea (has not required zofran in 24h)   # HIV disease--well controlled: CD4 = 680 & HIV RNA Quant <20 02/08/12. Followed by Dr. Maurice [name] outpatient- on Atripla,  -continue home HIV med  regimen -f/u with ID as outpatient  # HTN: Blood pressure remains elevated for last 24 h ; patient tells me systolic blood pressure has always remained high, and she has been on HCTZ in  the past which caused syncope - continue to monitor  - Continue amlodipine and Benazepril - f/u with pcp as outpatient and titrate medications as needed  # Acute on chronic voiding dysfucntion: stable and back to baseline; S/p meatal dilation and cystoscopy in '05 per Dr. Brunilda Payor with Urology.   # Tobacco abuse: treatment with nicotine patch during hospitalization -cessation strongly advised, counseling given  Diet: Low residue DVT PPX: [place] Heparin  Dispo: discharge home today  The patient does have a current PCP [name], MD), therefore will be requiring OPC follow-up after discharge.  The patient does not have transportation limitations that hinder transportation to clinic appointments.  Services Needed at time of discharge: Y = Yes, Blank = No PT:   OT:   RN:   Equipment:   Other:     LOS: 11 days   Darden Palmer 02/19/2012, 6:58 AM

## 2012-02-19 NOTE — Progress Notes (Signed)
Nutrition Follow-up  Intervention:   1. Continue current interventions. RD to continue to follow nutrition care plan  DOCUMENTATION CODES  Per approved criteria   -Moderate malnutrition in the context of chronic illness    Assessment:   TPN discontinued on 12/6. Per chart, pt is tolerating low-residue diet and without n/v. Pt having regular BMs. Pt to d/c home today.  Potassium, phosphorus, and magnesium all WNL at this time.  Diet Order:  Regular Supplements: Breeze PO BID  Meds: Scheduled Meds:    . amLODipine  5 mg Oral Daily  . antiseptic oral rinse  15 mL Mouth Rinse q12n4p  . benazepril  40 mg Oral Daily  . chlorhexidine  15 mL Mouth Rinse BID  . efavirenz-emtricitabine-tenofovir  1 tablet Oral QHS  . feeding supplement  1 Container Oral BID BM  . fentaNYL  50 mcg Transdermal Q72H  . heparin  5,000 Units Subcutaneous Q8H  . nicotine  21 mg Transdermal Daily  . [DISCONTINUED] insulin aspart  0-9 Units Subcutaneous TID AC & HS   Continuous Infusions:  PRN Meds:.diphenhydrAMINE, diphenhydrAMINE, diphenhydrAMINE, hydrALAZINE, ibuprofen, naloxone, ondansetron (ZOFRAN) IV, oxyCODONE-acetaminophen, phenol, sodium chloride, sodium chloride, [DISCONTINUED]  HYDROmorphone (DILAUDID) injection, [DISCONTINUED] oxyCODONE   CMP     Component Value Date/Time   NA 138 02/17/2012 0500   K 4.1 02/17/2012 0500   CL 103 02/17/2012 0500   CO2 24 02/17/2012 0500   GLUCOSE 106* 02/17/2012 0500   BUN 8 02/17/2012 0500   CREATININE 0.38* 02/17/2012 0500   CREATININE 0.62 10/20/2011 1006   CALCIUM 9.4 02/17/2012 0500   PROT 5.9* 02/15/2012 0545   ALBUMIN 2.3* 02/15/2012 0545   AST 13 02/15/2012 0545   ALT 8 02/15/2012 0545   ALKPHOS 80 02/15/2012 0545   BILITOT 0.3 02/15/2012 0545   GFRNONAA >90 02/17/2012 0500   GFRAA >90 02/17/2012 0500    CBG (last 3)   Basename 02/18/12 1204 02/18/12 0808 02/17/12 2148  GLUCAP 94 81 113*   Sodium  Date/Time Value Range Status  02/17/2012  5:00 AM 138  135  - 145 mEq/L Final  02/16/2012  5:00 AM 140  135 - 145 mEq/L Final  02/15/2012  5:45 AM 138  135 - 145 mEq/L Final    Potassium  Date/Time Value Range Status  02/17/2012  5:00 AM 4.1  3.5 - 5.1 mEq/L Final     DELTA CHECK NOTED  02/16/2012  5:00 AM 3.4* 3.5 - 5.1 mEq/L Final  02/15/2012  5:45 AM 3.3* 3.5 - 5.1 mEq/L Final    Phosphorus  Date/Time Value Range Status  02/16/2012  5:00 AM 3.4  2.3 - 4.6 mg/dL Final  40/11/8117  1:47 AM 2.7  2.3 - 4.6 mg/dL Final  82/11/5619  3:08 AM 2.4  2.3 - 4.6 mg/dL Final    Magnesium  Date/Time Value Range Status  02/17/2012  5:00 AM 1.8  1.5 - 2.5 mg/dL Final  65/09/8467  6:29 AM 1.8  1.5 - 2.5 mg/dL Final  52/10/4130  4:40 AM 1.7  1.5 - 2.5 mg/dL Final   Prealbumin  Date/Time Value Range Status  02/13/2012  5:00 AM 11.1* 17.0 - 34.0 mg/dL Final     Intake/Output Summary (Last 24 hours) at 02/19/12 1010 Last data filed at 02/19/12 0515  Gross per 24 hour  Intake    370 ml  Output   2050 ml  Net  -1680 ml   Weight Status:  125 lb - no new wt since 11/30  Estimated  needs:  1650 - 1775 kcal, 75 - 90 grams protein  Nutrition Dx:  Inadequate oral intake now r/t altered GI function AEB slowly increasing PO intake.  Goal:  Pt to meet >/= 90% of their estimated nutrition needs - unmet 2/2 slow advancement of regular consistency foods  Monitor:  weight trends, lab trends, I/O's, po intake, supplement intake  Jarold Motto MS, [place], LDN Pager: [phone] After-hours pager: [phone]

## 2012-02-19 NOTE — Progress Notes (Signed)
Discharge instructions/Med Rec Sheet reviewed w/ pt. Pt expressed understanding and copies given w/ prescriptions. Pt d/c'd in stable condition via w/c, accompanied by discharge volunteers 

## 2012-02-19 NOTE — Telephone Encounter (Signed)
The pharmacist from [facility] on [address] in [place] called and advised she had a Rx for this patient for Norco 10-325 56 tabs. She advised she received a flag from Medicare when she tried to processed it so she pulled the the Narcotics database report and saw that the patient just had a Rx for Lorcet 10-650 for 60 tabs filled 02/05/12 and her Fentanyl patches also. She was concerned further when she advised the patient she would have to call the office and the patient asked for the Rx to be returned to her, she noticed the patient address and this is the first Rx the patient has tried to fill at this pharmacy. Advised the pharmacist not to fill the Rx until I can speak with a doctor. Advised her will call back [phone] as soon as I can get an answer about the Rx.  Called the patient to advised her that the new pain Rx is not able to be filled because it was not time and the meds she picked up on 02/05/12 she should still have on hand as she just got out of the hospital had to leave a message with her daughter Lupita Leash as she was resting. Lupita Leash advised ok and she would let her mother know. Advised if she has any questions she can call the office.

## 2012-02-20 ENCOUNTER — Telehealth: Payer: Self-pay | Admitting: Dietician

## 2012-02-20 ENCOUNTER — Encounter (INDEPENDENT_AMBULATORY_CARE_PROVIDER_SITE_OTHER): Payer: Self-pay

## 2012-02-20 ENCOUNTER — Other Ambulatory Visit: Payer: Self-pay | Admitting: *Deleted

## 2012-02-20 ENCOUNTER — Telehealth: Payer: Self-pay | Admitting: *Deleted

## 2012-02-20 DIAGNOSIS — F419 Anxiety disorder, unspecified: Secondary | ICD-10-CM

## 2012-02-20 DIAGNOSIS — G894 Chronic pain syndrome: Secondary | ICD-10-CM

## 2012-02-20 DIAGNOSIS — M5126 Other intervertebral disc displacement, lumbar region: Secondary | ICD-10-CM

## 2012-02-20 MED ORDER — ALPRAZOLAM 1 MG PO TABS: 1.0000 mg | ORAL_TABLET | Freq: Three times a day (TID) | ORAL | Status: DC | PRN

## 2012-02-20 MED ORDER — FENTANYL 50 MCG/HR TD PT72: 1.0000 | MEDICATED_PATCH | TRANSDERMAL | Status: DC

## 2012-02-20 NOTE — Telephone Encounter (Signed)
Patient daughter Jessica [name] called this morning and advised she was told to stop taking the lortab 10/650 and given the hydro 10/325 mg. Advised her to bring [name] the unused meds and we can get her the meds she needs.
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
## 2012-02-20 NOTE — Telephone Encounter (Deleted)
Discharge date:02-19-12 Call date: 02-20-12 Hospital follow up appointment date:   Calling to assist with transition of care from hospital to home.  Discharge medications reviewed:  Able to fill all prescriptions?  Patient aware of hospital follow up appointments.   No problems with transportation.  Other problems/concerns:
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
## 2012-02-21 NOTE — Telephone Encounter (Signed)
patient left voicemail that she was able to get her medicines.

## 2012-02-23 ENCOUNTER — Ambulatory Visit (INDEPENDENT_AMBULATORY_CARE_PROVIDER_SITE_OTHER): Payer: Medicare Other

## 2012-02-23 DIAGNOSIS — Z4802 Encounter for removal of sutures: Secondary | ICD-10-CM

## 2012-02-23 NOTE — Progress Notes (Signed)
Patient is here today for her staple removal.  The wound has almost healed completely, and there is no irritation from the staples.  Staples removed and steri strips applied.  Patient will f/u with Dr. Donell Beers in January 2014.

## 2012-03-04 ENCOUNTER — Other Ambulatory Visit: Payer: Self-pay | Admitting: *Deleted

## 2012-03-04 ENCOUNTER — Encounter: Payer: Self-pay | Admitting: Internal Medicine

## 2012-03-04 ENCOUNTER — Ambulatory Visit (INDEPENDENT_AMBULATORY_CARE_PROVIDER_SITE_OTHER): Payer: Medicare Other | Admitting: Internal Medicine

## 2012-03-04 ENCOUNTER — Telehealth: Payer: Self-pay | Admitting: *Deleted

## 2012-03-04 VITALS — BP 160/85 | HR 83 | Temp 98.9°F | Wt 123.9 lb

## 2012-03-04 DIAGNOSIS — K56609 Unspecified intestinal obstruction, unspecified as to partial versus complete obstruction: Secondary | ICD-10-CM

## 2012-03-04 DIAGNOSIS — Z7189 Other specified counseling: Secondary | ICD-10-CM

## 2012-03-04 DIAGNOSIS — Z720 Tobacco use: Secondary | ICD-10-CM

## 2012-03-04 DIAGNOSIS — G894 Chronic pain syndrome: Secondary | ICD-10-CM

## 2012-03-04 DIAGNOSIS — K5900 Constipation, unspecified: Secondary | ICD-10-CM

## 2012-03-04 DIAGNOSIS — Z7689 Persons encountering health services in other specified circumstances: Secondary | ICD-10-CM

## 2012-03-04 DIAGNOSIS — I10 Essential (primary) hypertension: Secondary | ICD-10-CM

## 2012-03-04 DIAGNOSIS — F17200 Nicotine dependence, unspecified, uncomplicated: Secondary | ICD-10-CM

## 2012-03-04 MED ORDER — AMLODIPINE BESYLATE 10 MG PO TABS: 5.0000 mg | ORAL_TABLET | Freq: Every day | ORAL | Status: DC

## 2012-03-04 MED ORDER — SENNOSIDES-DOCUSATE SODIUM 8.6-50 MG PO TABS: 2.0000 | ORAL_TABLET | Freq: Two times a day (BID) | ORAL | Status: DC

## 2012-03-04 NOTE — Assessment & Plan Note (Signed)
  Assessment: 1. The patient was counseled on the dangers of tobacco use, which include, but are not limited to cardiovascular disease, increased cancer risk of multiple types of cancer, COPD, peripheral vascular disease, strokes. 2. She was also counseled on the benefits of smoking cessation. 3. Progress toward smoking cessation:   Improved. 4. Barriers to progress toward smoking cessation:   Habit  Plan: 1. Instruction/counseling given: The patient was firmly advised to quit and referred to a tobacco cessation program.   2. We also reviewed strategies to maximize success, including:  Removing cigarettes and smoking materials from environment  Stress management  Substitution of other forms of reinforcement Support of family/friends.  Selecting a quit date. 3. Educational resources provided: [facility] (1-800-QUIT-NOW) brochure 4. Self management tools provided:   5. Medications to assist with smoking cessation: None 6. Patient agreed to the following self-care plans for smoking cessation:

## 2012-03-04 NOTE — Telephone Encounter (Signed)
Patient called and advises she will be in town today for an appt and wanted to see if she could get her Rx filled. She wants to have her Fentyal and her Hydrocodone filled. Advised the patient that we just did that on 02/20/12 for both and it is to early to refill. She advised she did not get the full Rx and encouraged me to call the [facility] at [phone] and speak to Johnny Bridge about this. She advised she was only given meds until enough meds until 03/08/12 and needs a new Rx. Advised her will have to call the pharmacy and find out what is going on.   [employer] and spoke with Nida Boatman as Johnny Bridge was not there. Advised him of the patients claim and he checked her profile and advised that they filled the entire Rx for both. He advised she also received Xanax on 02/05/12 #90 and 02/20/12 #30 so he requested we not authorize any more Xanax until at least 03/27/12. Advised him will make a note of the request and advise the patient also.   Called the patient and advised her that we will not be able to fill the Rx's for her until 03/22/12 as the pharmacy filled the entire Rx and she needs to check her box. She advised that she had done that and she found the other patches and she understands that she will not get anything else until 03/22/12.

## 2012-03-04 NOTE — Assessment & Plan Note (Signed)
Pertinent Data:  CT Abdomen and Pelvis (02/08/2012) - Mid distal small bowel obstruction with transition in the mid pelvis likely due to adhesions  Exploratory laparotomy, lysis of adhesions (02/12/2012) - by Dr. Donell Beers   Assessment: Patient has had more regulation of her bowel movements after starting the scheduled senokot and after her procedure. She continues to have some pain but seems to be just over incisional site and is stable. She has not  significantly reduced  Plan:      Continue follow-up with surgery as indicated.

## 2012-03-04 NOTE — Progress Notes (Signed)
Patient: Jessica [name]   MRN: [id number]  DOB: 1941-02-09   PCP: Rocco Serene, MD   Subjective:    HPI: Jessica [name] is a 71 y.o. 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 She additionally had an episode of clear brown liquid emesis. CT abdomen on admission revealed mid distal small bowel obstruction with transition in the mid pelvis likely due to adhesions. Conservative therapy was initially pursued with bowel rest and NG tube, without improvement of the pain. Therefore, the patient underwent exploratory laparotomy with lysis of adhesions on 12/022013 by surgery service. During the remainder of the hospital course, the patient's abdominal pain slowly improved and her diet was slowly advanced. The patient was given a short course of narcotic pain mediations, and has not yet followed up with surgery, but is scheduled Mar 20, 2011.   Since discharge home, she is feeling well. States she is NOT having symptoms of nausea, vomiting, stable and improving abdominal pain, is taking senokot once a day. With the bowel regimen, she is having bowel movements every other day (initially daily after dc was first having BM every day). No fever, chills, blood in stools. She is eating and drinking well.   2) HTN - Patient does check blood pressure regularly at home - 160-170. Currently taking Amlodipine 5mg  the Lotensin 40mg . Patient misses doses 0 x per week on average. denies headaches, dizziness, lightheadedness, chest pain, shortness of breath.  does not  request refills today.   3) Preventative care - has had a colonoscopy in 2010, said she has had her zostavax recently, will bring [name] records.    Review of Systems: Per HPI.    Current Outpatient Medications: Medication Sig  . acetaminophen (TYLENOL) 500 MG tablet Take 500 mg by mouth every 4 (four) hours as needed. For pain  . acyclovir (ZOVIRAX) 400 MG tablet Take 1 tablet (400 mg total) by mouth 3 (three) times daily. as needed for herpes outbreak  . ALPRAZolam (XANAX) 1 MG tablet Take 1 tablet (1 mg total) by mouth 3 (three) times daily as needed. For anxiety  . amLODipine (NORVASC) 5 MG tablet Take 1 tablet (5 mg total) by mouth daily.  Marland Kitchen aspirin 81 MG EC tablet Take 81 mg by mouth daily.    . benazepril (LOTENSIN) 40 MG tablet Take 40 mg by mouth at bedtime.  . cyclobenzaprine (FLEXERIL) 10 MG tablet Take 1 tablet (10 mg total) by mouth 3 (three) times daily as needed. For spasms  . efavirenz-emtricitabine-tenofovir (ATRIPLA) 600-200-300 MG per tablet Take 1 tablet by mouth at bedtime.  . ENSURE (ENSURE) Take 237 mLs by mouth 2 (two) times daily.  . fentaNYL (DURAGESIC) 50 MCG/HR Place 1 patch (50 mcg total) onto the skin every 3 (three) days.  Marland Kitchen HYDROcodone-acetaminophen (NORCO) 10-325 MG per tablet Take 1 tablet by mouth every 6 (six) hours as needed for pain.  Marland Kitchen ibuprofen (ADVIL,MOTRIN) 400 MG tablet Take 1 tablet (400 mg total) by mouth every 4 (four) hours as needed for pain.  Marland Kitchen lovastatin (MEVACOR) 40 MG tablet Take  1 tablet (40 mg total) by mouth at bedtime.  . Multiple Vitamin (MULTIVITAMIN WITH MINERALS) TABS Take 1 tablet by mouth daily.  . nicotine (NICODERM CQ - DOSED IN MG/24 HOURS) 21 mg/24hr patch Place 1 patch onto the skin daily.  Marland Kitchen senna-docusate (SENOKOT-S) 8.6-50 MG per tablet Take 2 tablets by mouth 2 (two) times daily.  . Travoprost, BAK Free, (TRAVATAN) 0.004 % SOLN ophthalmic solution Place 1 drop into both eyes at bedtime.  . triamcinolone ointment (KENALOG)  0.1 % Apply 1 application topically 2 (two) times daily.    Allergies No Known Allergies   Past Medical History  Diagnosis Date  . Glaucoma     left  . Arthritis   . Hypertension   . Hyperlipidemia   . SBO (small bowel obstruction)     In 12/2010, required small bowel resection with  anastamosis - required 2/2 SBO due to adhesions  //  02/2012 - partial SBO 2/2 adhesions s/p exploratory laparotomy and adhesionolysis  . Peripheral vascular disease     s/p aortofemoral artery bypass  . Heart murmur   . 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 Chronic lower back pain   . Tobacco abuse   . Diverticulosis     significant left-sided transverse diverticula as per colonoscopy 05/2010 by Dr. Eula Listen  . Hyperplastic colonic polyp     single polyp noted on colonoscopy 05/2008    Past Surgical History  Procedure Date  . Eye surgery   . 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 Appendectomy   . Cholecystectomy   . Colectomy 01/2011    Dr. Jamey Ripa; "took out 12 inches of small intestiines and removed blockage"  . Abdominal hysterectomy   . Laparotomy 02/12/2012    Procedure: EXPLORATORY LAPAROTOMY;  Surgeon: Almond Lint, MD;  Location: [facility] OR;  Service: General;  Laterality: N/A;  Exploratory Laparotomy, lysis of adhesions    Family History  Problem Relation Age of Onset  . Kidney failure Mother   . Diabetes Mother   . Hypertension Mother   . Heart disease Mother   . Glaucoma Father   . Congestive Heart Failure Sister   . Diabetes Sister   . Kidney disease Sister   . Diabetes Sister   . Diabetes Brother     Social History History   Social History  . Marital Status: Widowed    Spouse Name: N/A    Number of Children: 4  . Years of Education: 2y college   Occupational History  . 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    Social History Main Topics  . Smoking status: Light Tobacco Smoker -- 0.1 packs/day for 50 years    Types: Cigarettes  . Smokeless tobacco:  Never Used     Comment: continues to be a light smoker  . Alcohol Use: No     Comment: "last drink of alcohol ~ 1977"  . Drug Use: No  . Sexually Active: No   Other Topics Concern  . None   Social History Narrative   Lives in [place], [place] with daughter and nephew     Objective:    Physical Exam: Filed Vitals:   03/04/12 1606  BP: 160/85  Pulse: 83  Temp:      General: Vital signs reviewed and noted. Well-developed, well-nourished, in no acute distress; alert, appropriate and cooperative throughout examination.  Head: Normocephalic, atraumatic.  Lungs:  Normal respiratory effort. Clear to auscultation BL without crackles or wheezes.  Heart: RRR. S1 and S2 normal without gallop, rubs. (+) systolic murmur.  Abdomen:  BS normoactive. Soft, Nondistended, mild tenderness to palpation over midline incisional site. No redness, edema, warmth surrounding the incisional site. The incisional site appears to be well healing, is held together with Steri-Strips at this time. No significant dehiscence of the wound.  No masses or organomegaly.  Extremities: No pretibial edema.    Assessment/ Plan:   Case and plan of care discussed with attending physician, Dr. Margarito Liner.

## 2012-03-04 NOTE — Patient Instructions (Signed)
General Instructions:  Please follow-up at the clinic in 1-2 month, at which time we will reevaluate your blood pressure, constipation - OR, please follow-up in the clinic sooner if needed.  There have been changes in your medications:  INCREASE Amlodipine to 10mg  daily - a new prescription has been sent to your pharmacy.  STOP Amlodipine 5mg  daily.  INCREASE Sennokot to 2 pills twice daily - a new prescription has been sent to your pharmacy.   Keep working on smoking cessation - see tips below.  If you have been started on new medication(s), and you develop symptoms concerning for allergic reaction, including, but not limited to, throat closing, tongue swelling, rash, please stop the medication immediately and call the clinic at [phone], and go to the ER.  If you are diabetic, please bring your meter to your next visit.  If symptoms worsen, or new symptoms arise, please call the clinic or go to the ER.  PLEASE BRING ALL OF YOUR MEDICATIONS  IN A BAG TO YOUR NEXT APPOINTMENT   Treatment Goals:  Goals (1 Years of Data) as of 03/04/2012    None      Progress Toward Treatment Goals:    Self Care Goals & Plans:  Self Care Goal 03/04/2012  Manage my medications take my medicines as prescribed; bring my medications to every visit  Monitor my health keep track of my weight  Eat healthy foods eat foods that are low in salt; eat baked foods instead of fried foods    LIFESTYLE TIPS TO HELP WITH YOUR BLOOD PRESSURE CONTROL  DASH DIET:  The DASH diet stands for "Dietary Approaches to Stop Hypertension." It is a healthy eating plan that has been shown to reduce high blood pressure (hypertension) in as little as 14 days, while also possibly providing other significant health benefits. These other health benefits include reducing the risk of breast cancer after menopause and reducing the risk of type 2 diabetes, heart disease, colon cancer, and stroke. Health benefits also include  weight loss and slowing kidney failure in patients with chronic kidney disease.   Diet guidelines: Limit salt (sodium). Your diet should contain less than 1500 mg of sodium daily.  Limit refined or processed carbohydrates. Your diet should include mostly whole grains. Desserts and added sugars should be used sparingly.  Include small amounts of heart-healthy fats. These types of fats include nuts, oils, and tub margarine. Limit saturated and trans fats. These fats have been shown to be harmful in the body.   Choosing Foods: The following food groups are based on a 2000 calorie diet. See your Registered Dietitian for individual calorie needs.  Grains and Grain Products (6 to 8 servings daily)  Eat More Often: Whole-wheat bread, brown rice, whole-grain or wheat pasta, quinoa, popcorn without added fat or salt (air popped).  Eat Less Often: White bread, white pasta, white rice, cornbread.  Vegetables (4 to 5 servings daily)  Eat More Often: Fresh, frozen, and canned vegetables. Vegetables may be raw, steamed, roasted, or grilled with a minimal amount of fat.  Eat Less Often/Avoid: Creamed or fried vegetables. Vegetables in a cheese sauce.  Fruit (4 to 5 servings daily)  Eat More Often: All fresh, canned (in natural juice), or frozen fruits. Dried fruits without added sugar. One hundred percent fruit juice ( cup [237 mL] daily).  Eat Less Often: Dried fruits with added sugar. Canned fruit in light or heavy syrup.  [employer], Fish, and Poultry (2 servings or less daily.  One serving is 3 to 4 oz [85-114 g]).  Eat More Often: Ninety percent or leaner ground beef, tenderloin, sirloin. Round cuts of beef, chicken breast, [place] breast. All fish. Grill, bake, or broil your meat. Nothing should be fried.  Eat Less Often/Avoid: Fatty cuts of meat, [place], or chicken leg, thigh, or wing. Fried cuts of meat or fish.  Dairy (2 to 3 servings)  Eat More Often: Low-fat or fat-free milk, low-fat plain or light  yogurt, reduced-fat or part-skim cheese.  Eat Less Often/Avoid: Milk (whole, 2%, skim, or chocolate). Whole milk yogurt. Full-fat cheeses.  Nuts, Seeds, and Legumes (4 to 5 servings per week)  Eat More Often: All without added salt.  Eat Less Often/Avoid: Salted nuts and seeds, canned beans with added salt.  Fats and Sweets (limited)  Eat More Often: Vegetable oils, tub margarines without trans fats, sugar-free gelatin. Mayonnaise and salad dressings.  Eat Less Often/Avoid: Coconut oils, palm oils, butter, stick margarine, cream, half and half, cookies, candy, pie.   ________________________________________________________________________  Smoking Cessation Tips 1-800-QUIT-NOW  This document explains the best ways for you to quit smoking and new treatments to help. It lists new medicines that can double or triple your chances of quitting and quitting for good. It also considers ways to avoid relapses and concerns you may have about quitting, including weight gain.   Nicotine: A Powerful Addiction If you have tried to quit smoking, you know how hard it can be. It is hard because nicotine is a very addictive drug. For some people, it can be as addictive as heroin or cocaine. Usually, people make 2 or 3 tries, or more, before finally being able to quit. Each time you try to quit, you can learn about what helps and what hurts. Quitting takes hard work and a lot of effort, but you can quit smoking.   Quitting smoking is one of the most important things you will ever do You will live longer, feel better, and live better.  The impact on your body of quitting smoking is felt almost immediately:   Five keys to quitting: Studies have shown that these 5 steps will help you quit smoking and quit for good. You have the best chances of quitting if you use them together:   1. GET READY  Set a quit date.  Change your environment.  Get rid of ALL cigarettes, ashtrays, matches, and lighters in your home,  car, and place of work.  Do not let people smoke in your home.  Review your past attempts to quit. Think about what worked and what did not.  Once you quit, do not smoke. NOT EVEN A PUFF!   2. GET SUPPORT AND ENCOURAGEMENT  Tell your family, friends, and coworkers that you are going to quit and need their support. Ask them not to smoke around you.  Get individual, group, or telephone counseling and support.  Many smokers find one or more of the many self-help books available useful in helping them quit and stay off tobacco.   3. LEARN NEW SKILLS AND BEHAVIORS  Try to distract yourself from urges to smoke. Talk to someone, go for a walk, or occupy your time with a task.  When you first try to quit, change your routine. Take a different route to work. Drink tea instead of coffee. Eat breakfast in a different place.  Do something to reduce your stress. Take a hot bath, exercise, or read a book.  Plan something enjoyable to do every  day. Reward yourself for not smoking.  Explore interactive web-based programs that specialize in helping you quit.   4. GET MEDICINE AND USE IT CORRECTLY .  Medicines can help you stop smoking and decrease the urge to smoke. Combining medicine with the above behavioral methods and support can quadruple your chances of successfully quitting smoking.  Talk with your doctor about these options.  5. BE PREPARED FOR RELAPSE OR DIFFICULT SITUATIONS  Most relapses occur within the first 3 months after quitting. Do not be discouraged if you start smoking again. Remember, most people try several times before they finally quit.  You may have symptoms of withdrawal because your body is used to nicotine. You may crave cigarettes, be irritable, feel very hungry, cough often, get headaches, or have difficulty concentrating.  The withdrawal symptoms are only temporary. They are strongest when you first quit, but they will go away within 10 to 14 days.   Quitting takes hard work  and a lot of effort, but you can quit smoking.   FOR MORE INFORMATION  Smokefree.gov (http://www.davis-sullivan.com/) provides free, accurate, evidence-based information and professional assistance to help support the immediate and long-term needs of people trying to quit smoking.  Document Released: 02/21/2001 Document Re-Released: 08/17/2009  [facility] Patient Information 2011 [place], [place].

## 2012-03-06 ENCOUNTER — Encounter: Payer: Self-pay | Admitting: Internal Medicine

## 2012-03-06 NOTE — Assessment & Plan Note (Signed)
Assessment: Stable at this time, however, easily becomes constipated, and she is on multiple contributing medications (chronic narcotics and Amlodipine). Given that her bowels have slightly slowed to once every other day (from daily), will escalate her Senokot.  Plan:      Change Senokot to 2 pills BID - hold for loose stools.

## 2012-03-06 NOTE — Assessment & Plan Note (Signed)
Pertinent Data: BP Readings from Last 3 Encounters:  03/04/12 160/85  02/19/12 181/57  02/19/12 181/57    Basic Metabolic Panel:    Component Value Date/Time   NA 138 02/17/2012 0500   K 4.1 02/17/2012 0500   CL 103 02/17/2012 0500   CO2 24 02/17/2012 0500   BUN 8 02/17/2012 0500   CREATININE 0.38* 02/17/2012 0500   CREATININE 0.62 10/20/2011 1006   GLUCOSE 106* 02/17/2012 0500   CALCIUM 9.4 02/17/2012 0500    Assessment: Disease Control:  Uncontrolled  Progress toward goals:  Unchanged  Barriers to meeting goals: no barriers identified    Patient was previously discontinued of HCTZ (previously on ACE-I-HCTZ combo pill) by cardiology in 09/2011 in setting of recurrent syncopal events that were of unclear etiology with (+) orthostatic vital signs at that time. She remains off of this therapy and states she was previously better controlled of her blood pressure when on the diuretic.     She was started on Amlodipine 5mg  daily during her hospitalization in setting of uncontrolled HTN - thus far has not had worsening constipation in the setting of its use.   We will cautiously will proceed with the Amlodipine at this time - patient was advised that it can result in constipation, therefore, she is to inform us if she has worsening constipation, at which time we will consider change in therapy.  Patient is compliant most of the time with prescribed medications.   Plan:  Increase Amlodipine 10mg  daily and continue Benazapril  Patient informed to call clinic if having worsening constipation with the Amlodipine.  As well, she was recommended to confer with her cardiologist at next visit regarding potential to resume HCTZ if BP remains uncontrolled.   Educational resources provided: brochure  Self management tools provided:

## 2012-03-06 NOTE — Assessment & Plan Note (Signed)
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 Similar findings at C4-C5, C5-C6. Primarily left-sided neural foraminal narrowing at C6-C7.  MRI Lumbar Spine (08/2006) - Mild degenerative disc disease at L5-S1. Slightly more loss of height since the 2001 exam, but no herniation or neural compression.   Assessment: Patient continues on chronic narcotic therapy per her former PCP and ID physician, Dr. Maurice March, for her chronic low back pain. Although patient feels these medications are needed for pain control, there has been concern raised in past regarding contribution of her multiple sedative medications towards syncopal events that were experienced in 10/2011. We did not specifically address her chronic pain as she was just establishing care today, having a hospital follow-up visit today, and is stable on home meds in terms of pain. However, pt was advised that she will need to have ongoing discussions with her PCP regarding appropriateness of her narcotic medications, particularly in setting of her recurrent SBO and syncopal events.  Plan:      No change to medications today.  Continue senokot for constipation.  Advised to speak with her PCP about her pain meds at next visit.  She was advised that the dosage of the tylenol component in her narcotic medication will have to be adjusted to no greater than 325 mg in accordance with federal regulations - patient expresses understanding.

## 2012-03-08 ENCOUNTER — Other Ambulatory Visit: Payer: Self-pay | Admitting: Internal Medicine

## 2012-03-08 DIAGNOSIS — I10 Essential (primary) hypertension: Secondary | ICD-10-CM

## 2012-03-08 MED ORDER — AMLODIPINE BESYLATE 10 MG PO TABS: 10.0000 mg | ORAL_TABLET | Freq: Every day | ORAL | Status: DC

## 2012-03-18 ENCOUNTER — Telehealth: Payer: Self-pay | Admitting: *Deleted

## 2012-03-18 NOTE — Telephone Encounter (Signed)
Patient called to get her Patches and her Hydrocodone refilled. Advised patient that Dr Maurice March will not be in until Friday 03/22/12 and we can get her her Rx's at that time. She wanted it early since she will be in [facility] tomorrow for an appt. Advised her will see her Friday after 11 am.

## 2012-03-19 ENCOUNTER — Encounter (INDEPENDENT_AMBULATORY_CARE_PROVIDER_SITE_OTHER): Payer: Self-pay | Admitting: General Surgery

## 2012-03-19 ENCOUNTER — Ambulatory Visit (INDEPENDENT_AMBULATORY_CARE_PROVIDER_SITE_OTHER): Payer: Medicare Other | Admitting: General Surgery

## 2012-03-19 VITALS — BP 136/76 | HR 85 | Temp 98.0°F | Resp 18 | Ht 68.0 in | Wt 122.0 lb

## 2012-03-19 DIAGNOSIS — K56609 Unspecified intestinal obstruction, unspecified as to partial versus complete obstruction: Secondary | ICD-10-CM

## 2012-03-19 NOTE — Assessment & Plan Note (Signed)
Patient is status post exploratory laparotomy and lysis of adhesions 02/12/2012.  No evidence of surgical complications.  Given patient's chronic narcotic use for back pain, I recommended that she take a laxative at a minimum of every other day and that she may need a daily.  Followup as needed.

## 2012-03-19 NOTE — Progress Notes (Signed)
HISTORY: This is now one month status post exploratory laparoscopy for bowel obstruction.  She did reasonably well other than having an ileus briefly postoperatively as expected.  She continues to take narcotics as needed for her back pain, but she states that she only has a minimal amount of muscular soreness at the sides of the incision.  She denies fevers and chills. She denies drainage from the wound. She has not been having any nausea and vomiting. She is having some constipation and is having to take laxatives.    EXAM: General:  Alert and oriented.   Incision:  No evidence of infection or hernia.     PATHOLOGY: n/a   ASSESSMENT AND PLAN:   SBO (small bowel obstruction), recurrent Patient is status post exploratory laparotomy and lysis of adhesions 02/12/2012.  No evidence of surgical complications.  Given patient's chronic narcotic use for back pain, I recommended that she take a laxative at a minimum of every other day and that she may need a daily.  Followup as needed.      Maudry Diego, MD Surgical Oncology, General & Endocrine Surgery [facility]  Josem Kaufmann, Smith Mince, MD [name], MD

## 2012-03-19 NOTE — Patient Instructions (Signed)
Take laxative at least every other day.  May need it every day.  Follow up as needed.

## 2012-03-22 ENCOUNTER — Other Ambulatory Visit: Payer: Self-pay | Admitting: *Deleted

## 2012-03-22 DIAGNOSIS — G894 Chronic pain syndrome: Secondary | ICD-10-CM

## 2012-03-22 DIAGNOSIS — M5126 Other intervertebral disc displacement, lumbar region: Secondary | ICD-10-CM

## 2012-03-22 MED ORDER — FENTANYL 50 MCG/HR TD PT72: 1.0000 | MEDICATED_PATCH | TRANSDERMAL | Status: DC

## 2012-04-04 ENCOUNTER — Other Ambulatory Visit: Payer: Medicare Other

## 2012-04-05 ENCOUNTER — Encounter: Payer: Self-pay | Admitting: Internal Medicine

## 2012-04-05 ENCOUNTER — Ambulatory Visit (INDEPENDENT_AMBULATORY_CARE_PROVIDER_SITE_OTHER): Payer: Medicare Other | Admitting: Internal Medicine

## 2012-04-05 ENCOUNTER — Ambulatory Visit: Payer: Medicare Other

## 2012-04-05 VITALS — BP 148/74 | HR 85 | Temp 99.2°F | Wt 125.9 lb

## 2012-04-05 DIAGNOSIS — M5126 Other intervertebral disc displacement, lumbar region: Secondary | ICD-10-CM | POA: Diagnosis not present

## 2012-04-05 DIAGNOSIS — L219 Seborrheic dermatitis, unspecified: Secondary | ICD-10-CM

## 2012-04-05 DIAGNOSIS — I739 Peripheral vascular disease, unspecified: Secondary | ICD-10-CM | POA: Diagnosis not present

## 2012-04-05 DIAGNOSIS — T50904A Poisoning by unspecified drugs, medicaments and biological substances, undetermined, initial encounter: Secondary | ICD-10-CM

## 2012-04-05 DIAGNOSIS — F411 Generalized anxiety disorder: Secondary | ICD-10-CM | POA: Diagnosis not present

## 2012-04-05 DIAGNOSIS — K5903 Drug induced constipation: Secondary | ICD-10-CM

## 2012-04-05 DIAGNOSIS — E782 Mixed hyperlipidemia: Secondary | ICD-10-CM | POA: Insufficient documentation

## 2012-04-05 DIAGNOSIS — F17200 Nicotine dependence, unspecified, uncomplicated: Secondary | ICD-10-CM

## 2012-04-05 DIAGNOSIS — I10 Essential (primary) hypertension: Secondary | ICD-10-CM | POA: Diagnosis not present

## 2012-04-05 DIAGNOSIS — N959 Unspecified menopausal and perimenopausal disorder: Secondary | ICD-10-CM

## 2012-04-05 DIAGNOSIS — Z720 Tobacco use: Secondary | ICD-10-CM

## 2012-04-05 DIAGNOSIS — E785 Hyperlipidemia, unspecified: Secondary | ICD-10-CM | POA: Diagnosis not present

## 2012-04-05 DIAGNOSIS — Z0000 Encounter for general adult medical examination without abnormal findings: Secondary | ICD-10-CM | POA: Insufficient documentation

## 2012-04-05 DIAGNOSIS — F419 Anxiety disorder, unspecified: Secondary | ICD-10-CM

## 2012-04-05 DIAGNOSIS — M899 Disorder of bone, unspecified: Secondary | ICD-10-CM

## 2012-04-05 DIAGNOSIS — H409 Unspecified glaucoma: Secondary | ICD-10-CM

## 2012-04-05 DIAGNOSIS — A6000 Herpesviral infection of urogenital system, unspecified: Secondary | ICD-10-CM | POA: Diagnosis not present

## 2012-04-05 DIAGNOSIS — B20 Human immunodeficiency virus [HIV] disease: Secondary | ICD-10-CM

## 2012-04-05 DIAGNOSIS — K5909 Other constipation: Secondary | ICD-10-CM | POA: Diagnosis not present

## 2012-04-05 HISTORY — DX: Anxiety disorder, unspecified: F41.9

## 2012-04-05 HISTORY — DX: Hyperlipidemia, unspecified: E78.5

## 2012-04-05 MED ORDER — HYDROCHLOROTHIAZIDE 12.5 MG PO TABS: 12.5000 mg | ORAL_TABLET | Freq: Every day | ORAL | Status: DC

## 2012-04-05 MED ORDER — SORBITOL 70 % PO SOLN: 15.0000 mL | Freq: Every day | ORAL | Status: DC | PRN

## 2012-04-05 NOTE — Assessment & Plan Note (Signed)
This is reasonably well-controlled on the as needed alprazolam. She usually uses 1 mg by mouth twice a day. We will continue at the current dose.

## 2012-04-05 NOTE — Progress Notes (Signed)
  Subjective:    Patient ID: Jessica [name], female    DOB: [birth year], 72 y.o.   MRN: [id number]  HPI  Please see the A&P for the status of the pt's chronic medical problems.  Review of Systems  Constitutional: Negative for fever, activity change, appetite change and unexpected weight change.  HENT: Negative for facial swelling, neck pain and neck stiffness.   Eyes: Negative for pain and visual disturbance.  Respiratory: Negative for chest tightness, shortness of breath and wheezing.   Cardiovascular: Negative for chest pain and leg swelling.  Gastrointestinal: Positive for constipation. Negative for nausea, abdominal pain and diarrhea.  Musculoskeletal: Positive for myalgias, back pain and arthralgias. Negative for joint swelling and gait problem.  Skin: Positive for rash.  Neurological: Negative for dizziness, seizures, syncope, speech difficulty, weakness, light-headedness and numbness.  Psychiatric/Behavioral: Negative for dysphoric mood and agitation. The patient is nervous/anxious.       Objective:   Physical Exam  Nursing note and vitals reviewed. Constitutional: She is oriented to person, place, and time. She appears well-developed and well-nourished. No distress.  HENT:  Head: Normocephalic and atraumatic.  Eyes: Conjunctivae normal are normal. No scleral icterus.  Neck: Normal range of motion. Neck supple.  Cardiovascular: Normal rate and regular rhythm.  Exam reveals no gallop and no friction rub.   Murmur heard.  Systolic murmur is present with a grade of 2/6       RUSB  Pulmonary/Chest: Effort normal and breath sounds normal. No respiratory distress. She has no wheezes. She has no rales.  Abdominal: Soft. Bowel sounds are normal. She exhibits no distension. There is no tenderness. There is no rebound and no guarding.  Musculoskeletal: Normal range of motion. She exhibits no edema and no tenderness.  Neurological: She is alert and oriented to person, place, and  time. She exhibits normal muscle tone.  Skin: Skin is warm and dry. No rash noted. She is not diaphoretic. No erythema.  Psychiatric: She has a normal mood and affect. Her behavior is normal. Judgment and thought content normal.      Assessment & Plan:   Please see problem oriented charting.

## 2012-04-05 NOTE — Assessment & Plan Note (Signed)
She did not complain of any claudication. Her blood pressure will be aggressively controlled as noted above. She is also continued on the lovastatin. Finally smoking cessation was encouraged as outlined below.

## 2012-04-05 NOTE — Assessment & Plan Note (Signed)
This has been successfully treated with the triamcinolone cream. We will continue the current regimen.

## 2012-04-05 NOTE — Assessment & Plan Note (Signed)
We discussed screening for osteoporosis given her underlying chronic disease, chronic smoking, and low weight. She was interested in a DEXA scan and this was ordered.

## 2012-04-05 NOTE — Patient Instructions (Signed)
It was nice to meet you.  I look forward to caring for you for a very long time.  1) Keep taking all of you medications as prescribed.  2) Stop the tylenol and restart the advil for breakthrough pain.  3) Restart the hydrochlorothiazide 12.5 mg each morning for blood pressure.  4) Try the sorbitol 70% 1-5 tablespoons in morning coffee adjusting to the desired frequency of bowel movements.  5) I will see you in 4 months to follow-up your blood pressure.

## 2012-04-05 NOTE — Assessment & Plan Note (Signed)
Follow in ophthalmology. We'll continue the travoprost.

## 2012-04-05 NOTE — Assessment & Plan Note (Signed)
Today's blood pressure is 148/74. This is slightly above target. She's compliant with the amlodipine at 10 mg a day and benazepril at 40 mg a day. In the past, when she was on a benazepril/hydrochlorothiazide combination she had difficulty with syncope. The syncope resolved when the hydrochlorothiazide was removed from the benazepril combination. Ironically, she had been on hydrochlorothiazide for nearly 30 years prior to the syncope without any difficulties. As she is slightly above target we will reintroduce hydrochlorothiazide at 12.5 mg by mouth daily to the amlodipine and benazepril in hopes of getting her blood pressure to target.

## 2012-04-05 NOTE — Assessment & Plan Note (Signed)
She continues to have chronic back pain related to her degenerative disc disease. The pain is well-controlled on the fentanyl patches at 50 mcg per hour for 72 hours, hydrocodone 10/325 mg by mouth every 6 hours as needed, Flexeril 10 mg by mouth every 8 hours as needed, and ibuprofen 400 mg by mouth as needed. We will continue this combination.

## 2012-04-05 NOTE — Assessment & Plan Note (Signed)
Given her peripheral vascular occlusive disease, smoking cessation was strongly encouraged. She states she's not currently ready to quit at this time. She has quit in the past. She usually fails when she is under stress and reflexively reaches for cigarettes. When she is ready to quit, we can consider using the nicotine patches, and as needed nicotine gum for those extra cravings. Once off tobacco, she may benefit from continued nicotine gum as something to reach for during those stressful situations in an attempt to keep her permanently off smoking. This issue will be addressed at every visit.

## 2012-04-05 NOTE — Assessment & Plan Note (Signed)
The lovastatin will be continued at 40 mg by mouth every night. We will check an LDL cholesterol to assure it remains under 100 at the followup visit.

## 2012-04-05 NOTE — Assessment & Plan Note (Signed)
Followed closely by Dr. Maurice March in the [facility] for Infectious Diseases. She claims to be compliant with the Atripla. We'll defer further management to Dr. Maurice March.

## 2012-04-05 NOTE — Assessment & Plan Note (Signed)
Her constipation remains problematic despite the Senokot. Given it is likely to be chronic secondary to her need for chronic narcotic pain relief, we decided to use an osmotic agent. She was therefore started on sorbitol 70% 1-5 tablespoons by mouth every morning in her coffee titrated to the desired frequency of bowel movements. She will continue the Senokot when necessary until she has titrated sorbitol to goal.

## 2012-04-15 ENCOUNTER — Ambulatory Visit (HOSPITAL_COMMUNITY)
Admission: RE | Admit: 2012-04-15 | Discharge: 2012-04-15 | Disposition: A | Discharge status: Home / Self Care | Payer: Medicare Other | Source: Ambulatory Visit | Attending: Internal Medicine | Admitting: Internal Medicine

## 2012-04-15 DIAGNOSIS — M810 Age-related osteoporosis without current pathological fracture: Secondary | ICD-10-CM | POA: Insufficient documentation

## 2012-04-15 DIAGNOSIS — F17200 Nicotine dependence, unspecified, uncomplicated: Secondary | ICD-10-CM | POA: Diagnosis not present

## 2012-04-15 DIAGNOSIS — M899 Disorder of bone, unspecified: Secondary | ICD-10-CM | POA: Diagnosis not present

## 2012-04-15 DIAGNOSIS — R634 Abnormal weight loss: Secondary | ICD-10-CM | POA: Diagnosis not present

## 2012-04-15 DIAGNOSIS — Z13820 Encounter for screening for osteoporosis: Secondary | ICD-10-CM | POA: Diagnosis not present

## 2012-04-15 DIAGNOSIS — Z0000 Encounter for general adult medical examination without abnormal findings: Secondary | ICD-10-CM

## 2012-04-15 DIAGNOSIS — Z780 Asymptomatic menopausal state: Secondary | ICD-10-CM | POA: Diagnosis not present

## 2012-04-15 HISTORY — DX: Age-related osteoporosis without current pathological fracture: M81.0

## 2012-04-18 ENCOUNTER — Telehealth: Payer: Self-pay | Admitting: Licensed Clinical Social Worker

## 2012-04-18 ENCOUNTER — Other Ambulatory Visit: Payer: Self-pay | Admitting: Licensed Clinical Social Worker

## 2012-04-18 DIAGNOSIS — M5126 Other intervertebral disc displacement, lumbar region: Secondary | ICD-10-CM

## 2012-04-18 DIAGNOSIS — G894 Chronic pain syndrome: Secondary | ICD-10-CM

## 2012-04-18 MED ORDER — HYDROCODONE-ACETAMINOPHEN 10-325 MG PO TABS: 1.0000 | ORAL_TABLET | Freq: Four times a day (QID) | ORAL | Status: DC | PRN

## 2012-04-18 MED ORDER — FENTANYL 50 MCG/HR TD PT72: 1.0000 | MEDICATED_PATCH | TRANSDERMAL | Status: DC

## 2012-04-18 NOTE — Telephone Encounter (Signed)
RX for Norco was phoned in to her pharmacy, It was printed by mistake

## 2012-04-19 ENCOUNTER — Ambulatory Visit: Payer: Medicare Other | Admitting: Infectious Diseases

## 2012-04-25 ENCOUNTER — Encounter: Payer: Self-pay | Admitting: Internal Medicine

## 2012-04-25 DIAGNOSIS — M810 Age-related osteoporosis without current pathological fracture: Secondary | ICD-10-CM

## 2012-04-25 MED ORDER — ALENDRONATE SODIUM 70 MG PO TABS: 70.0000 mg | ORAL_TABLET | ORAL | Status: DC

## 2012-04-25 MED ORDER — VITAMIN D 400 UNITS PO TABS: 800.0000 [IU] | ORAL_TABLET | Freq: Every day | ORAL | Status: DC

## 2012-04-25 NOTE — Progress Notes (Signed)
I called Jessica [name] with the results of the DEXA scan showing severe osteoporosis.  We discussed therapy including Calcium Carbonate 1250 mg daily, Vitamin D 800 units daily, and alendronate 70 mg weekly.  She is agreeable to the initiation of therapy.  Prescriptions have been sent.

## 2012-05-06 ENCOUNTER — Other Ambulatory Visit: Payer: Self-pay | Admitting: Internal Medicine

## 2012-05-06 DIAGNOSIS — I10 Essential (primary) hypertension: Secondary | ICD-10-CM

## 2012-05-14 ENCOUNTER — Telehealth: Payer: Self-pay | Admitting: *Deleted

## 2012-05-14 NOTE — Telephone Encounter (Signed)
Patient called asking for her monthly refills of fentanyl patches (50 mcg #10), vicodin (10-325 #60), and xanax (1mg  #30).  Advised her that Dr. Maurice March will be here Friday to sign them for her to pick up.  Also on Friday, RN will call the vicodin in to [facility] [phone]) and the xanax to [facility] [phone]) as [facility] is unable to fill xanax at this time. Andree Coss, RN

## 2012-05-15 ENCOUNTER — Ambulatory Visit (INDEPENDENT_AMBULATORY_CARE_PROVIDER_SITE_OTHER): Payer: Medicare Other | Admitting: Internal Medicine

## 2012-05-15 ENCOUNTER — Encounter: Payer: Self-pay | Admitting: Internal Medicine

## 2012-05-15 VITALS — BP 147/78 | HR 102 | Temp 98.3°F | Wt 122.0 lb

## 2012-05-15 DIAGNOSIS — Z21 Asymptomatic human immunodeficiency virus [HIV] infection status: Secondary | ICD-10-CM

## 2012-05-15 DIAGNOSIS — B20 Human immunodeficiency virus [HIV] disease: Secondary | ICD-10-CM

## 2012-05-15 DIAGNOSIS — M810 Age-related osteoporosis without current pathological fracture: Secondary | ICD-10-CM | POA: Diagnosis not present

## 2012-05-15 MED ORDER — ALENDRONATE SODIUM 70 MG PO TABS: 70.0000 mg | ORAL_TABLET | ORAL | Status: DC

## 2012-05-15 NOTE — Patient Instructions (Signed)
We would like you to use ibuprofen and heating pads on your belly pain. You can take 2 of the 200 mg ibuprofen pills 2-3 times per day for 2 weeks to help with the pain. We are going to give you exercises for your shoulder to help strengthen your muscles to give you less pain.    Shoulder Exercises EXERCISES  RANGE OF MOTION (ROM) AND STRETCHING EXERCISES These exercises may help you when beginning to rehabilitate your injury. Your symptoms may resolve with or without further involvement from your physician, physical therapist or athletic trainer. While completing these exercises, remember:   Restoring tissue flexibility helps normal motion to return to the joints. This allows healthier, less painful movement and activity.  An effective stretch should be held for at least 30 seconds.  A stretch should never be painful. You should only feel a gentle lengthening or release in the stretched tissue. ROM - Pendulum  Bend at the waist so that your right / left arm falls away from your body. Support yourself with your opposite hand on a solid surface, such as a table or a countertop.  Your right / left arm should be perpendicular to the ground. If it is not perpendicular, you need to lean over farther. Relax the muscles in your right / left arm and shoulder as much as possible.  Gently sway your hips and trunk so they move your right / left arm without any use of your right / left shoulder muscles.  Progress your movements so that your right / left arm moves side to side, then forward and backward, and finally, both clockwise and counterclockwise.  Complete __________ repetitions in each direction. Many people use this exercise to relieve discomfort in their shoulder as well as to gain range of motion. Repeat __________ times. Complete this exercise __________ times per day. STRETCH  Flexion, Standing  Stand with good posture. With an underhand grip on your right / left hand and an overhand grip  on the opposite hand, grasp a broomstick or cane so that your hands are a little more than shoulder-width apart.  Keeping your right / left elbow straight and shoulder muscles relaxed, push the stick with your opposite hand to raise your right / left arm in front of your body and then overhead. Raise your arm until you feel a stretch in your right / left shoulder, but before you have increased shoulder pain.  Try to avoid shrugging your right / left shoulder as your arm rises by keeping your shoulder blade tucked down and toward your mid-back spine. Hold __________ seconds.  Slowly return to the starting position. Repeat __________ times. Complete this exercise __________ times per day. STRETCH - Internal Rotation  Place your right / left hand behind your back, palm-up.  Throw a towel or belt over your opposite shoulder. Grasp the towel/belt with your right / left hand.  While keeping an upright posture, gently pull up on the towel/belt until you feel a stretch in the front of your right / left shoulder.  Avoid shrugging your right / left shoulder as your arm rises by keeping your shoulder blade tucked down and toward your mid-back spine.  Hold __________. Release the stretch by lowering your opposite hand. Repeat __________ times. Complete this exercise __________ times per day. STRETCH - External Rotation and Abduction  Stagger your stance through a doorframe. It does not matter which foot is forward.  As instructed by your physician, physical therapist or athletic trainer, place  your hands:  And forearms above your head and on the door frame.  And forearms at head-height and on the door frame.  At elbow-height and on the door frame.  Keeping your head and chest upright and your stomach muscles tight to prevent over-extending your low-back, slowly shift your weight onto your front foot until you feel a stretch across your chest and/or in the front of your shoulders.  Hold  __________ seconds. Shift your weight to your back foot to release the stretch. Repeat __________ times. Complete this stretch __________ times per day.  STRENGTHENING EXERCISES  These exercises may help you when beginning to rehabilitate your injury. They may resolve your symptoms with or without further involvement from your physician, physical therapist or athletic trainer. While completing these exercises, remember:   Muscles can gain both the endurance and the strength needed for everyday activities through controlled exercises.  Complete these exercises as instructed by your physician, physical therapist or athletic trainer. Progress the resistance and repetitions only as guided.  You may experience muscle soreness or fatigue, but the pain or discomfort you are trying to eliminate should never worsen during these exercises. If this pain does worsen, stop and make certain you are following the directions exactly. If the pain is still present after adjustments, discontinue the exercise until you can discuss the trouble with your clinician.  If advised by your physician, during your recovery, avoid activity or exercises which involve actions that place your right / left hand or elbow above your head or behind your back or head. These positions stress the tissues which are trying to heal. STRENGTH - Scapular Depression and Adduction  With good posture, sit on a firm chair. Supported your arms in front of you with pillows, arm rests or a table top. Have your elbows in line with the sides of your body.  Gently draw your shoulder blades down and toward your mid-back spine. Gradually increase the tension without tensing the muscles along the top of your shoulders and the back of your neck.  Hold for __________ seconds. Slowly release the tension and relax your muscles completely before completing the next repetition.  After you have practiced this exercise, remove the arm support and complete it in  standing as well as sitting. Repeat __________ times. Complete this exercise __________ times per day.  STRENGTH - External Rotators  Secure a rubber exercise band/tubing to a fixed object so that it is at the same height as your right / left elbow when you are standing or sitting on a firm surface.  Stand or sit so that the secured exercise band/tubing is at your side that is not injured.  Bend your elbow 90 degrees. Place a folded towel or small pillow under your right / left arm so that your elbow is a few inches away from your side.  Keeping the tension on the exercise band/tubing, pull it away from your body, as if pivoting on your elbow. Be sure to keep your body steady so that the movement is only coming from your shoulder rotating.  Hold __________ seconds. Release the tension in a controlled manner as you return to the starting position. Repeat __________ times. Complete this exercise __________ times per day.  STRENGTH - Supraspinatus  Stand or sit with good posture. Grasp a __________ weight or an exercise band/tubing so that your hand is "thumbs-up," like when you shake hands.  Slowly lift your right / left hand from your thigh into the air, traveling  about 30 degrees from straight out at your side. Lift your hand to shoulder height or as far as you can without increasing any shoulder pain. Initially, many people do not lift their hands above shoulder height.  Avoid shrugging your right / left shoulder as your arm rises by keeping your shoulder blade tucked down and toward your mid-back spine.  Hold for __________ seconds. Control the descent of your hand as you slowly return to your starting position. Repeat __________ times. Complete this exercise __________ times per day.  STRENGTH - Shoulder Extensors  Secure a rubber exercise band/tubing so that it is at the height of your shoulders when you are either standing or sitting on a firm arm-less chair.  With a thumbs-up grip,  grasp an end of the band/tubing in each hand. Straighten your elbows and lift your hands straight in front of you at shoulder height. Step back away from the secured end of band/tubing until it becomes tense.  Squeezing your shoulder blades together, pull your hands down to the sides of your thighs. Do not allow your hands to go behind you.  Hold for __________ seconds. Slowly ease the tension on the band/tubing as you reverse the directions and return to the starting position. Repeat __________ times. Complete this exercise __________ times per day.  STRENGTH - Scapular Retractors  Secure a rubber exercise band/tubing so that it is at the height of your shoulders when you are either standing or sitting on a firm arm-less chair.  With a palm-down grip, grasp an end of the band/tubing in each hand. Straighten your elbows and lift your hands straight in front of you at shoulder height. Step back away from the secured end of band/tubing until it becomes tense.  Squeezing your shoulder blades together, draw your elbows back as you bend them. Keep your upper arm lifted away from your body throughout the exercise.  Hold __________ seconds. Slowly ease the tension on the band/tubing as you reverse the directions and return to the starting position. Repeat __________ times. Complete this exercise __________ times per day. STRENGTH  Scapular Depressors  Find a sturdy chair without wheels, such as a from a dining room table.  Keeping your feet on the floor, lift your bottom from the seat and lock your elbows.  Keeping your elbows straight, allow gravity to pull your body weight down. Your shoulders will rise toward your ears.  Raise your body against gravity by drawing your shoulder blades down your back, shortening the distance between your shoulders and ears. Although your feet should always maintain contact with the floor, your feet should progressively support less body weight as you get  stronger.  Hold __________ seconds. In a controlled and slow manner, lower your body weight to begin the next repetition. Repeat __________ times. Complete this exercise __________ times per day.  Document Released: 01/11/2005 Document Revised: 05/22/2011 Document Reviewed: 06/11/2008 [facility] Patient Information 2013 [place], [place].
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
## 2012-05-20 ENCOUNTER — Other Ambulatory Visit: Payer: Self-pay | Admitting: *Deleted

## 2012-05-20 ENCOUNTER — Telehealth: Payer: Self-pay | Admitting: *Deleted

## 2012-05-20 DIAGNOSIS — E785 Hyperlipidemia, unspecified: Secondary | ICD-10-CM

## 2012-05-20 DIAGNOSIS — G894 Chronic pain syndrome: Secondary | ICD-10-CM

## 2012-05-20 DIAGNOSIS — M5126 Other intervertebral disc displacement, lumbar region: Secondary | ICD-10-CM

## 2012-05-20 DIAGNOSIS — F419 Anxiety disorder, unspecified: Secondary | ICD-10-CM

## 2012-05-20 MED ORDER — HYDROCODONE-ACETAMINOPHEN 10-325 MG PO TABS: 1.0000 | ORAL_TABLET | Freq: Four times a day (QID) | ORAL | Status: DC | PRN

## 2012-05-20 MED ORDER — LOVASTATIN 40 MG PO TABS: 40.0000 mg | ORAL_TABLET | Freq: Every day | ORAL | Status: DC

## 2012-05-20 MED ORDER — FENTANYL 50 MCG/HR TD PT72: 1.0000 | MEDICATED_PATCH | TRANSDERMAL | Status: DC

## 2012-05-20 MED ORDER — ALPRAZOLAM 1 MG PO TABS: 1.0000 mg | ORAL_TABLET | Freq: Three times a day (TID) | ORAL | Status: DC | PRN

## 2012-05-20 NOTE — Progress Notes (Signed)
Patient in office to pick up rx for fentanyl #10 with 0 refills.  Also requested a refill of her lovastatin.  RN called in refills for her xanax 1mg  #30 no refills and vicodin 10-325 #60 with no refills, electronically sent lovastatin refill.  Per her PCP's notes, they will refill these future prescriptions.

## 2012-05-20 NOTE — Telephone Encounter (Signed)
Patient in office to pick up rx for fentanyl #10 with 0 refills.  Also requested a refill of her lovastatin.  RN called in refills for her xanax 1mg  #30 no refills and vicodin 10-325 #60 with no refills, electronically sent lovastatin refill - all to [facility].  Per her PCP's notes, they will refill these future prescriptions.  Left message on patient's voicemail to notify her of this change. Andree Coss, RN

## 2012-05-21 NOTE — Telephone Encounter (Signed)
[facility] was able to fill all meds. Patient notified. Patient also advised that her PCP was willing to take over her prescriptions per his last note. Patient agreeable and pleased at the prospect of all her prescriptions coming from one place. Andree Coss, RN

## 2012-05-22 ENCOUNTER — Encounter (INDEPENDENT_AMBULATORY_CARE_PROVIDER_SITE_OTHER): Payer: Medicare Other

## 2012-05-22 DIAGNOSIS — I6529 Occlusion and stenosis of unspecified carotid artery: Secondary | ICD-10-CM

## 2012-05-30 ENCOUNTER — Other Ambulatory Visit (INDEPENDENT_AMBULATORY_CARE_PROVIDER_SITE_OTHER): Payer: Medicare Other

## 2012-05-30 ENCOUNTER — Other Ambulatory Visit: Payer: Self-pay | Admitting: Infectious Disease

## 2012-05-30 ENCOUNTER — Other Ambulatory Visit: Payer: Self-pay | Admitting: *Deleted

## 2012-05-30 DIAGNOSIS — B20 Human immunodeficiency virus [HIV] disease: Secondary | ICD-10-CM | POA: Diagnosis not present

## 2012-05-30 DIAGNOSIS — F419 Anxiety disorder, unspecified: Secondary | ICD-10-CM

## 2012-05-30 LAB — CBC WITH DIFFERENTIAL/PLATELET
Basophils Absolute: 0.1 10*3/uL (ref 0.0–0.1)
Basophils Relative: 1 % (ref 0–1)
Eosinophils Absolute: 0.1 10*3/uL (ref 0.0–0.7)
Eosinophils Relative: 1 % (ref 0–5)
HCT: 41 % (ref 36.0–46.0)
Lymphocytes Relative: 54 % — ABNORMAL HIGH (ref 12–46)
MCHC: 35.1 g/dL (ref 30.0–36.0)
MCV: 93.2 fL (ref 78.0–100.0)
Monocytes Absolute: 0.3 10*3/uL (ref 0.1–1.0)
Platelets: 396 10*3/uL (ref 150–400)
RDW: 13.9 % (ref 11.5–15.5)
WBC: 8.7 10*3/uL (ref 4.0–10.5)

## 2012-05-30 LAB — COMPLETE METABOLIC PANEL WITH GFR
AST: 17 U/L (ref 0–37)
Albumin: 4.7 g/dL (ref 3.5–5.2)
Alkaline Phosphatase: 103 U/L (ref 39–117)
BUN: 13 mg/dL (ref 6–23)
CO2: 27 mEq/L (ref 19–32)
Calcium: 10.4 mg/dL (ref 8.4–10.5)
Total Protein: 7.9 g/dL (ref 6.0–8.3)

## 2012-05-30 MED ORDER — ALPRAZOLAM 1 MG PO TABS: 1.0000 mg | ORAL_TABLET | Freq: Three times a day (TID) | ORAL | Status: DC | PRN

## 2012-06-06 ENCOUNTER — Other Ambulatory Visit: Payer: Self-pay | Admitting: Internal Medicine

## 2012-06-06 DIAGNOSIS — I10 Essential (primary) hypertension: Secondary | ICD-10-CM

## 2012-06-14 ENCOUNTER — Ambulatory Visit: Payer: Self-pay | Admitting: Infectious Diseases

## 2012-06-14 ENCOUNTER — Telehealth: Payer: Self-pay | Admitting: *Deleted

## 2012-06-14 ENCOUNTER — Encounter: Payer: Self-pay | Admitting: Infectious Diseases

## 2012-06-14 ENCOUNTER — Other Ambulatory Visit: Payer: Self-pay | Admitting: *Deleted

## 2012-06-14 ENCOUNTER — Other Ambulatory Visit: Payer: Self-pay | Admitting: Infectious Diseases

## 2012-06-14 ENCOUNTER — Ambulatory Visit (INDEPENDENT_AMBULATORY_CARE_PROVIDER_SITE_OTHER): Payer: Medicare Other | Admitting: Infectious Diseases

## 2012-06-14 VITALS — BP 154/78 | HR 89 | Temp 98.8°F | Ht 67.5 in | Wt 120.0 lb

## 2012-06-14 DIAGNOSIS — B20 Human immunodeficiency virus [HIV] disease: Secondary | ICD-10-CM

## 2012-06-14 DIAGNOSIS — M51369 Other intervertebral disc degeneration, lumbar region without mention of lumbar back pain or lower extremity pain: Secondary | ICD-10-CM

## 2012-06-14 DIAGNOSIS — G894 Chronic pain syndrome: Secondary | ICD-10-CM

## 2012-06-14 DIAGNOSIS — M5126 Other intervertebral disc displacement, lumbar region: Secondary | ICD-10-CM

## 2012-06-14 DIAGNOSIS — M5137 Other intervertebral disc degeneration, lumbosacral region: Secondary | ICD-10-CM

## 2012-06-14 DIAGNOSIS — M5136 Other intervertebral disc degeneration, lumbar region: Secondary | ICD-10-CM

## 2012-06-14 MED ORDER — FENTANYL 50 MCG/HR TD PT72: 1.0000 | MEDICATED_PATCH | TRANSDERMAL | Status: DC

## 2012-06-14 NOTE — Telephone Encounter (Signed)
Reminder to refill Xanax and Vicodin on 06/20/12 Wendall Mola

## 2012-06-14 NOTE — Progress Notes (Signed)
Patient ID: Jessica [name], female   DOB: Jul 13, 1940, 72 y.o.   MRN: [id number] HIV f/u Audra is stable with very slight viral blip in quantitative HIV. CD4 counts have been stable and mostly >1000/cc. 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 Today she had been having about a week of pain over right  AC joint area with no history of recent injury. No fevers or swelling of this joint or others. [name] has a very long history of musculoskeletal complaints and multiple opiates. [name] also has chronic anxiety with history of panic attacks and has been on alprazolam for years. [name] has never received any of these drugs untimely or without medical supervision. She is generally easy to care for and is adherent to her antiHIV therapy. Dr. Zenaida Niece dam, my partner, will provide her HIV care when I retire in June, 2014. Exam BP 154/78  Pulse 89  Temp(Src) 98.8 F (37.1 C) (Oral)  Ht 5' 7.5" (1.715 m)  Wt 120 lb (54.432 kg)  BMI 18.51 kg/m2 [name] looks generally well. Her daughter is with her today and is complaining of sore leg for past two days and asked that I see her. I recommended ER since I have busy schedule and do not want to encourage her daughter's multiple problems with drug abuse and unrealistic expectations. Exam of right shoulder shows no warmth, redness or swelling. There is  Mild discomfort over AC area but good ROM of shoulder and right arm. A&P HIV suppressed and stable. Continue Atripla and f/u in 4 months and will see Dr Zenaida Niece dam. R shoulder pain x1week. No red flags and probably DJD flare. Rx local heat and rest. [name]

## 2012-06-17 ENCOUNTER — Other Ambulatory Visit: Payer: Self-pay | Admitting: *Deleted

## 2012-06-17 DIAGNOSIS — B20 Human immunodeficiency virus [HIV] disease: Secondary | ICD-10-CM

## 2012-06-17 MED ORDER — ENSURE PO LIQD: 237.0000 mL | Freq: Two times a day (BID) | ORAL | Status: DC

## 2012-06-19 ENCOUNTER — Telehealth: Payer: Self-pay | Admitting: Cardiovascular Disease

## 2012-06-19 NOTE — Telephone Encounter (Signed)
PT AWARE OF CAROTID RESULTS ./CY 

## 2012-06-19 NOTE — Telephone Encounter (Signed)
New Problem:    Patient called in returning your call from March regarding her Carotid results.  Please call back.

## 2012-06-20 ENCOUNTER — Other Ambulatory Visit: Payer: Self-pay | Admitting: *Deleted

## 2012-06-20 DIAGNOSIS — M5126 Other intervertebral disc displacement, lumbar region: Secondary | ICD-10-CM

## 2012-06-20 DIAGNOSIS — B20 Human immunodeficiency virus [HIV] disease: Secondary | ICD-10-CM

## 2012-06-20 DIAGNOSIS — G894 Chronic pain syndrome: Secondary | ICD-10-CM

## 2012-06-20 DIAGNOSIS — F419 Anxiety disorder, unspecified: Secondary | ICD-10-CM

## 2012-06-20 MED ORDER — HYDROCODONE-ACETAMINOPHEN 10-325 MG PO TABS: 1.0000 | ORAL_TABLET | Freq: Four times a day (QID) | ORAL | Status: DC | PRN

## 2012-06-20 MED ORDER — ENSURE PO LIQD: 237.0000 mL | Freq: Two times a day (BID) | ORAL | Status: DC

## 2012-06-20 MED ORDER — ALPRAZOLAM 1 MG PO TABS: 1.0000 mg | ORAL_TABLET | Freq: Three times a day (TID) | ORAL | Status: DC | PRN

## 2012-07-05 ENCOUNTER — Encounter: Payer: Self-pay | Admitting: *Deleted

## 2012-07-05 DIAGNOSIS — M5137 Other intervertebral disc degeneration, lumbosacral region: Secondary | ICD-10-CM

## 2012-07-05 NOTE — Progress Notes (Signed)
Patient ID: Jessica [name], female   DOB: 07/13/1940, 72 y.o.   MRN: [id number] Request for Permanent Disability Parking Placard.   Completed and siged by Dr. Maurice March  Copied and placed for scanning. Notified pt that paperwork is ready for pick-up on Monday, April 28th.

## 2012-07-12 ENCOUNTER — Encounter: Payer: Self-pay | Admitting: Internal Medicine

## 2012-07-12 ENCOUNTER — Ambulatory Visit (INDEPENDENT_AMBULATORY_CARE_PROVIDER_SITE_OTHER): Payer: Medicare Other | Admitting: Internal Medicine

## 2012-07-12 VITALS — BP 148/80 | HR 87 | Temp 98.0°F | Wt 122.8 lb

## 2012-07-12 DIAGNOSIS — R259 Unspecified abnormal involuntary movements: Secondary | ICD-10-CM

## 2012-07-12 DIAGNOSIS — M67919 Unspecified disorder of synovium and tendon, unspecified shoulder: Secondary | ICD-10-CM

## 2012-07-12 DIAGNOSIS — M5137 Other intervertebral disc degeneration, lumbosacral region: Secondary | ICD-10-CM | POA: Diagnosis not present

## 2012-07-12 DIAGNOSIS — Z1239 Encounter for other screening for malignant neoplasm of breast: Secondary | ICD-10-CM | POA: Diagnosis not present

## 2012-07-12 DIAGNOSIS — M7551 Bursitis of right shoulder: Secondary | ICD-10-CM

## 2012-07-12 DIAGNOSIS — H409 Unspecified glaucoma: Secondary | ICD-10-CM

## 2012-07-12 DIAGNOSIS — I10 Essential (primary) hypertension: Secondary | ICD-10-CM

## 2012-07-12 DIAGNOSIS — F17200 Nicotine dependence, unspecified, uncomplicated: Secondary | ICD-10-CM | POA: Diagnosis not present

## 2012-07-12 DIAGNOSIS — F419 Anxiety disorder, unspecified: Secondary | ICD-10-CM

## 2012-07-12 DIAGNOSIS — Z0000 Encounter for general adult medical examination without abnormal findings: Secondary | ICD-10-CM

## 2012-07-12 DIAGNOSIS — M51369 Other intervertebral disc degeneration, lumbar region without mention of lumbar back pain or lower extremity pain: Secondary | ICD-10-CM

## 2012-07-12 DIAGNOSIS — Z5189 Encounter for other specified aftercare: Secondary | ICD-10-CM

## 2012-07-12 DIAGNOSIS — T148 Other injury of unspecified body region: Secondary | ICD-10-CM | POA: Diagnosis not present

## 2012-07-12 DIAGNOSIS — W57XXXA Bitten or stung by nonvenomous insect and other nonvenomous arthropods, initial encounter: Secondary | ICD-10-CM

## 2012-07-12 DIAGNOSIS — M810 Age-related osteoporosis without current pathological fracture: Secondary | ICD-10-CM

## 2012-07-12 DIAGNOSIS — B20 Human immunodeficiency virus [HIV] disease: Secondary | ICD-10-CM | POA: Diagnosis not present

## 2012-07-12 DIAGNOSIS — R252 Cramp and spasm: Secondary | ICD-10-CM

## 2012-07-12 DIAGNOSIS — M719 Bursopathy, unspecified: Secondary | ICD-10-CM

## 2012-07-12 DIAGNOSIS — M5136 Other intervertebral disc degeneration, lumbar region: Secondary | ICD-10-CM

## 2012-07-12 DIAGNOSIS — M51379 Other intervertebral disc degeneration, lumbosacral region without mention of lumbar back pain or lower extremity pain: Secondary | ICD-10-CM

## 2012-07-12 DIAGNOSIS — E785 Hyperlipidemia, unspecified: Secondary | ICD-10-CM

## 2012-07-12 DIAGNOSIS — Z720 Tobacco use: Secondary | ICD-10-CM

## 2012-07-12 DIAGNOSIS — F411 Generalized anxiety disorder: Secondary | ICD-10-CM

## 2012-07-12 HISTORY — DX: Bursitis of right shoulder: M75.51

## 2012-07-12 LAB — LIPID PANEL
Cholesterol: 163 mg/dL (ref 0–200)
LDL Cholesterol: 58 mg/dL (ref 0–99)
VLDL: 17 mg/dL (ref 0–40)

## 2012-07-12 LAB — BASIC METABOLIC PANEL WITH GFR
BUN: 12 mg/dL (ref 6–23)
Chloride: 106 mEq/L (ref 96–112)
GFR, Est African American: >89 mL/min
GFR, Est Non African American: >89 mL/min
Potassium: 3.7 mEq/L (ref 3.5–5.3)
Sodium: 139 mEq/L (ref 135–145)

## 2012-07-12 MED ORDER — FENTANYL 50 MCG/HR TD PT72: 1.0000 | MEDICATED_PATCH | TRANSDERMAL | Status: DC

## 2012-07-12 MED ORDER — IBUPROFEN 200 MG PO TABS: 200.0000 mg | ORAL_TABLET | Freq: Four times a day (QID) | ORAL | Status: DC | PRN

## 2012-07-12 MED ORDER — DOCUSATE SODIUM 100 MG PO CAPS: 100.0000 mg | ORAL_CAPSULE | Freq: Every day | ORAL | Status: DC | PRN

## 2012-07-12 MED ORDER — HYDROCODONE-ACETAMINOPHEN 10-325 MG PO TABS: 1.0000 | ORAL_TABLET | Freq: Four times a day (QID) | ORAL | Status: DC | PRN

## 2012-07-12 MED ORDER — ALPRAZOLAM 1 MG PO TABS: 1.0000 mg | ORAL_TABLET | Freq: Three times a day (TID) | ORAL | Status: DC | PRN

## 2012-07-12 MED ORDER — HYDROCHLOROTHIAZIDE 25 MG PO TABS: 25.0000 mg | ORAL_TABLET | Freq: Every day | ORAL | Status: DC

## 2012-07-12 MED ORDER — CYCLOBENZAPRINE HCL 10 MG PO TABS: 10.0000 mg | ORAL_TABLET | Freq: Three times a day (TID) | ORAL | Status: DC | PRN

## 2012-07-12 NOTE — Assessment & Plan Note (Signed)
She is due for screening mammography. A referral has been placed as she is interested in getting further screening.

## 2012-07-12 NOTE — Assessment & Plan Note (Signed)
Her chronic back pain is reasonably well-controlled on the fentanyl patch, hydrocodone when necessary, and ibuprofen when necessary. We will continue with this regimen.

## 2012-07-12 NOTE — Assessment & Plan Note (Signed)
She noted that one week ago she was found to have 3 ticks on her body. These were removed. She has not had any fevers or rashes in the interim. She also denies any new arthralgias. The sites of the tick bites were unremarkable and there was no surrounding rash. She was asked to call the clinic if she would develop fevers, arthralgias, or rashes over the next 2-3 weeks. Otherwise we'll just monitor.

## 2012-07-12 NOTE — Assessment & Plan Note (Signed)
Her anxiety is well controlled on the alprazolam. We will therefore continue at the current dose.

## 2012-07-12 NOTE — Assessment & Plan Note (Signed)
In June of 2013 she moved. At that time she was picking up heavy objects and noted pain in her right shoulder. This pain lasted approximately one month and then improved. In November the right shoulder pain recurred and has been present since. It is somewhat disabling and prevents her from fully abducting her right arm or externally rotating her right arm. It has not responded to ibuprofen or her hydrocodone and fentanyl. She was interested in a prednisone injection in the chance that this may be a bursitis. We therefore perform this procedure in the clinic.  She was prepped and draped in the usual sterile fashion after informed consent was obtained and a time out was done. The site of the injection was palpated and a freezing solution was applied. A 27-gauge needle was inserted into the shoulder using the lateral approach. 1 cc of 1% lidocaine mixed with 1 cc of 40 mg Kenalog per cc was injected without difficulty. She noted improvement in her pain in the right shoulder immediately. She was called approximately 3 hours after the procedure and notes continued improvement in her shoulder pain although some discomfort at the site of the needle insertion. At this point she is happy with the result. She will call us if there are any complications including return of her pain over the next several days. If her pain does return it suggests she may have a rotator cuff tear that occurred during her move. If this is the case, we may proceed with further evaluation including an MRI and or referral to an orthopedic surgeon for evaluation if she was interested in possible surgical repair.

## 2012-07-12 NOTE — Progress Notes (Signed)
  Subjective:    Patient ID: Jessica [name], female    DOB: [birth year], 72 y.o.   MRN: [id number]  HPI  Please see the A&P for the status of the pt's chronic medical problems.  Review of Systems  Constitutional: Negative for activity change and fatigue.  Cardiovascular: Negative for leg swelling.  Gastrointestinal: Negative for constipation.  Musculoskeletal: Positive for back pain and arthralgias. Negative for myalgias, joint swelling and gait problem.  Skin: Negative for rash.      Objective:   Physical Exam  Nursing note and vitals reviewed. Constitutional: She is oriented to person, place, and time. She appears well-developed and well-nourished. No distress.  HENT:  Head: Normocephalic and atraumatic.  Eyes: Conjunctivae are normal. Right eye exhibits no discharge. Left eye exhibits no discharge. No scleral icterus.  Neck: Neck supple.  Pulmonary/Chest: Effort normal. No respiratory distress.  Musculoskeletal: She exhibits tenderness. She exhibits no edema.       Right shoulder: She exhibits decreased range of motion, tenderness, bony tenderness and pain. She exhibits no swelling, no effusion, no crepitus, no deformity, no laceration, no spasm and normal strength.  Neurological: She is alert and oriented to person, place, and time.  Skin: Skin is warm and dry. No rash noted. She is not diaphoretic.  Psychiatric: She has a normal mood and affect. Her behavior is normal. Judgment and thought content normal.      Assessment & Plan:   Please see Problem Oriented Charting.

## 2012-07-12 NOTE — Assessment & Plan Note (Signed)
Her blood pressure today was 148/80 which is slightly above target but improved from the previous visit. She states she's been compliant with the amlodipine, benazepril, and hydrochlorothiazide. She's tolerated these medications well without any symptoms including lightheadedness as has occurred before. We decided to increase the hydrochlorothiazide to 25 mg by mouth every morning and continue the benazepril at 40 mg at bedtime and amlodipine at 10 mg in the morning. With this increase in the hydrochlorothiazide we hope that her blood pressure will reach the target of less than 140/80. We checked an electrolyte panel to make sure that her potassium has not dropped in the setting of the hydrochlorothiazide. The results are pending at the time of this dictation. We will followup and make any necessary adjustments in her regimen.

## 2012-07-12 NOTE — Assessment & Plan Note (Signed)
She is tolerating the lovastatin at 40 mg by mouth each night well without myalgias. A cholesterol panel was drawn at this clinic visit but is pending at the time of this dictation. We will followup on the results of the cholesterol panel and make any appropriate changes.

## 2012-07-12 NOTE — Assessment & Plan Note (Signed)
[facility] I will defer further management to them.

## 2012-07-12 NOTE — Assessment & Plan Note (Signed)
She has a new eye care provider in [place]. The name of the practice is [facility]. We will contact them and try to obtain any records that may have concerning her eye care.

## 2012-07-12 NOTE — Assessment & Plan Note (Signed)
She is slowly trying to decrease the amount of cigarettes she smokes. She continues to defer nicotine patches and as needed nicotine gum to assist in her efforts to quit smoking. She will let us know if she changes her mind and would like pharmacologic aide in her attempts at smoking cessation.

## 2012-07-12 NOTE — Assessment & Plan Note (Signed)
She was started on the alendronate and has tolerated it well. 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

## 2012-07-12 NOTE — Assessment & Plan Note (Signed)
She tried the sorbitol and had no improvement with this therapy despite escalating the dose. She's been working on a home remedy which has been effective and she does not consider this to be a problem. We will continue to monitor over time.

## 2012-07-12 NOTE — Patient Instructions (Signed)
It was good to see you again.  1)  Keep taking your medications as you are.  2) We increased your hydrochlorothiazide to 25 mg by mouth each morning for your blood pressure.  3) We will set up a mammography appointment for you.  4) We injected your right shoulder to hopefully improve your pain.  5) We will see you back in 6 months, sooner if necessary.

## 2012-07-15 ENCOUNTER — Ambulatory Visit (HOSPITAL_COMMUNITY)
Admission: RE | Admit: 2012-07-15 | Discharge: 2012-07-15 | Disposition: A | Discharge status: Home / Self Care | Payer: Medicare Other | Source: Ambulatory Visit | Attending: Internal Medicine | Admitting: Internal Medicine

## 2012-07-15 DIAGNOSIS — Z1231 Encounter for screening mammogram for malignant neoplasm of breast: Secondary | ICD-10-CM | POA: Diagnosis not present

## 2012-07-15 DIAGNOSIS — Z1239 Encounter for other screening for malignant neoplasm of breast: Secondary | ICD-10-CM

## 2012-07-15 NOTE — Progress Notes (Signed)
BMP unremarkable  Total cholesterol 163 Triglycerides 87 HDL 88 LDL 58  Excellent lipid panel.  Will continue the lovastatin at 40 mg PO QHS.

## 2012-08-15 ENCOUNTER — Telehealth: Payer: Self-pay | Admitting: *Deleted

## 2012-08-15 NOTE — Telephone Encounter (Signed)
Patient is requesting her narcotic refills for Fentanyl, Xanax, and Norco for pick up on 08/20/12. Will print tomorrow for Dr. Zenaida Niece Dam's signature. Wendall Mola

## 2012-08-16 ENCOUNTER — Other Ambulatory Visit: Payer: Self-pay | Admitting: *Deleted

## 2012-08-16 DIAGNOSIS — M5136 Other intervertebral disc degeneration, lumbar region: Secondary | ICD-10-CM

## 2012-08-16 MED ORDER — FENTANYL 50 MCG/HR TD PT72: 1.0000 | MEDICATED_PATCH | TRANSDERMAL | Status: DC

## 2012-08-20 ENCOUNTER — Other Ambulatory Visit: Payer: Self-pay | Admitting: Licensed Clinical Social Worker

## 2012-08-20 ENCOUNTER — Telehealth: Payer: Self-pay | Admitting: Licensed Clinical Social Worker

## 2012-08-20 DIAGNOSIS — M5136 Other intervertebral disc degeneration, lumbar region: Secondary | ICD-10-CM

## 2012-08-20 DIAGNOSIS — F419 Anxiety disorder, unspecified: Secondary | ICD-10-CM

## 2012-08-20 MED ORDER — ALPRAZOLAM 1 MG PO TABS: 1.0000 mg | ORAL_TABLET | Freq: Three times a day (TID) | ORAL | Status: DC | PRN

## 2012-08-20 MED ORDER — HYDROCODONE-ACETAMINOPHEN 10-325 MG PO TABS: 1.0000 | ORAL_TABLET | Freq: Four times a day (QID) | ORAL | Status: DC | PRN

## 2012-08-20 NOTE — Telephone Encounter (Signed)
Yes she has, can I call the norco and xanax in?

## 2012-08-20 NOTE — Telephone Encounter (Signed)
Patient walked in to pick up fentanyl patch and she wanted her xanax and norco called in. Dr. Josem Kaufmann wrote for her xanax last month when Dr.Lane was out. I think that you are inheriting this patient from Dr. Maurice March would you be ok filling her pain meds and xanax from this point or should she get Dr. Josem Kaufmann to do it since that is her PCP?

## 2012-08-20 NOTE — Telephone Encounter (Signed)
That is fine 

## 2012-08-20 NOTE — Telephone Encounter (Signed)
I am not a big fan of fentanyl pain patches but I will fill it. Sounds like shes been oon it for ever

## 2012-09-05 ENCOUNTER — Other Ambulatory Visit: Payer: Medicare Other

## 2012-09-16 ENCOUNTER — Telehealth: Payer: Self-pay | Admitting: *Deleted

## 2012-09-16 DIAGNOSIS — M5136 Other intervertebral disc degeneration, lumbar region: Secondary | ICD-10-CM

## 2012-09-16 DIAGNOSIS — F419 Anxiety disorder, unspecified: Secondary | ICD-10-CM

## 2012-09-16 MED ORDER — ALPRAZOLAM 1 MG PO TABS: 1.0000 mg | ORAL_TABLET | Freq: Three times a day (TID) | ORAL | Status: DC | PRN

## 2012-09-16 MED ORDER — HYDROCODONE-ACETAMINOPHEN 10-325 MG PO TABS: 1.0000 | ORAL_TABLET | Freq: Four times a day (QID) | ORAL | Status: DC | PRN

## 2012-09-16 MED ORDER — FENTANYL 50 MCG/HR TD PT72: 1.0000 | MEDICATED_PATCH | TRANSDERMAL | Status: DC

## 2012-09-16 NOTE — Telephone Encounter (Signed)
Rxes placed in Dr. Moshe Cipro box for signature.

## 2012-09-19 ENCOUNTER — Ambulatory Visit: Payer: Medicare Other | Admitting: Infectious Disease

## 2012-09-26 ENCOUNTER — Other Ambulatory Visit (HOSPITAL_COMMUNITY)
Admission: RE | Admit: 2012-09-26 | Discharge: 2012-09-26 | Disposition: A | Discharge status: Home / Self Care | Payer: Medicare Other | Source: Ambulatory Visit | Attending: Internal Medicine | Admitting: Internal Medicine

## 2012-09-26 ENCOUNTER — Other Ambulatory Visit (INDEPENDENT_AMBULATORY_CARE_PROVIDER_SITE_OTHER): Payer: Medicare Other

## 2012-09-26 DIAGNOSIS — B20 Human immunodeficiency virus [HIV] disease: Secondary | ICD-10-CM

## 2012-09-26 DIAGNOSIS — Z113 Encounter for screening for infections with a predominantly sexual mode of transmission: Secondary | ICD-10-CM | POA: Insufficient documentation

## 2012-09-26 LAB — CBC WITH DIFFERENTIAL/PLATELET
Basophils Absolute: 0.1 10*3/uL (ref 0.0–0.1)
Basophils Relative: 1 % (ref 0–1)
Eosinophils Absolute: 0.1 10*3/uL (ref 0.0–0.7)
Eosinophils Relative: 1 % (ref 0–5)
Lymphocytes Relative: 46 % (ref 12–46)
MCHC: 35.9 g/dL (ref 30.0–36.0)
MCV: 95.4 fL (ref 78.0–100.0)
Platelets: 372 10*3/uL (ref 150–400)
RDW: 13.5 % (ref 11.5–15.5)
WBC: 9.1 10*3/uL (ref 4.0–10.5)

## 2012-09-26 LAB — COMPREHENSIVE METABOLIC PANEL
ALT: 13 U/L (ref 0–35)
AST: 18 U/L (ref 0–37)
CO2: 25 mEq/L (ref 19–32)
Chloride: 105 mEq/L (ref 96–112)
Sodium: 140 mEq/L (ref 135–145)
Total Bilirubin: 0.3 mg/dL (ref 0.3–1.2)
Total Protein: 7.7 g/dL (ref 6.0–8.3)

## 2012-09-27 LAB — HIV-1 RNA QUANT-NO REFLEX-BLD: HIV-1 RNA Quant, Log: <1.3 {Log} (ref <1.30)

## 2012-09-30 ENCOUNTER — Ambulatory Visit: Payer: Medicare Other

## 2012-09-30 ENCOUNTER — Other Ambulatory Visit: Payer: Medicare Other | Admitting: Internal Medicine

## 2012-09-30 DIAGNOSIS — R634 Abnormal weight loss: Secondary | ICD-10-CM

## 2012-09-30 MED ORDER — MEGESTROL ACETATE 40 MG PO TABS: 160.0000 mg/d | ORAL_TABLET | Freq: Two times a day (BID) | ORAL | Status: DC

## 2012-10-07 ENCOUNTER — Other Ambulatory Visit (HOSPITAL_COMMUNITY): Payer: Self-pay | Admitting: Internal Medicine

## 2012-10-07 ENCOUNTER — Ambulatory Visit (INDEPENDENT_AMBULATORY_CARE_PROVIDER_SITE_OTHER): Payer: Medicare Other | Admitting: Internal Medicine

## 2012-10-07 ENCOUNTER — Telehealth: Payer: Self-pay | Admitting: *Deleted

## 2012-10-07 ENCOUNTER — Encounter: Payer: Self-pay | Admitting: *Deleted

## 2012-10-07 ENCOUNTER — Observation Stay (HOSPITAL_COMMUNITY)
Admission: AD | Admit: 2012-10-07 | Discharge: 2012-10-08 | Disposition: A | Discharge status: Home / Self Care | Payer: Medicare Other | Source: Ambulatory Visit | Attending: Internal Medicine | Admitting: Internal Medicine

## 2012-10-07 ENCOUNTER — Ambulatory Visit (HOSPITAL_COMMUNITY)
Admission: RE | Admit: 2012-10-07 | Discharge: 2012-10-07 | Disposition: A | Discharge status: Home / Self Care | Payer: Medicare Other | Source: Ambulatory Visit | Attending: Internal Medicine | Admitting: Internal Medicine

## 2012-10-07 ENCOUNTER — Encounter: Payer: Self-pay | Admitting: Internal Medicine

## 2012-10-07 VITALS — BP 146/73 | HR 70 | Temp 98.2°F | Ht 68.0 in | Wt 126.4 lb

## 2012-10-07 DIAGNOSIS — R109 Unspecified abdominal pain: Secondary | ICD-10-CM | POA: Diagnosis not present

## 2012-10-07 DIAGNOSIS — E782 Mixed hyperlipidemia: Secondary | ICD-10-CM | POA: Diagnosis present

## 2012-10-07 DIAGNOSIS — Z79899 Other long term (current) drug therapy: Secondary | ICD-10-CM | POA: Insufficient documentation

## 2012-10-07 DIAGNOSIS — B20 Human immunodeficiency virus [HIV] disease: Secondary | ICD-10-CM | POA: Diagnosis present

## 2012-10-07 DIAGNOSIS — K5900 Constipation, unspecified: Secondary | ICD-10-CM | POA: Diagnosis not present

## 2012-10-07 DIAGNOSIS — Z21 Asymptomatic human immunodeficiency virus [HIV] infection status: Secondary | ICD-10-CM | POA: Diagnosis not present

## 2012-10-07 DIAGNOSIS — K5730 Diverticulosis of large intestine without perforation or abscess without bleeding: Secondary | ICD-10-CM | POA: Diagnosis not present

## 2012-10-07 DIAGNOSIS — K5903 Drug induced constipation: Secondary | ICD-10-CM

## 2012-10-07 DIAGNOSIS — R52 Pain, unspecified: Secondary | ICD-10-CM

## 2012-10-07 DIAGNOSIS — E785 Hyperlipidemia, unspecified: Secondary | ICD-10-CM | POA: Insufficient documentation

## 2012-10-07 DIAGNOSIS — M5136 Other intervertebral disc degeneration, lumbar region: Secondary | ICD-10-CM

## 2012-10-07 DIAGNOSIS — I10 Essential (primary) hypertension: Secondary | ICD-10-CM | POA: Diagnosis present

## 2012-10-07 LAB — CBC WITH DIFFERENTIAL/PLATELET
Basophils Absolute: 0.1 10*3/uL (ref 0.0–0.1)
Eosinophils Relative: 2 % (ref 0–5)
HCT: 36.4 % (ref 36.0–46.0)
Hemoglobin: 13.1 g/dL (ref 12.0–15.0)
Lymphocytes Relative: 59 % — ABNORMAL HIGH (ref 12–46)
Lymphs Abs: 5 10*3/uL — ABNORMAL HIGH (ref 0.7–4.0)
MCV: 95.8 fL (ref 78.0–100.0)
Monocytes Absolute: 0.3 10*3/uL (ref 0.1–1.0)
Neutro Abs: 3 10*3/uL (ref 1.7–7.7)
RBC: 3.8 MIL/uL — ABNORMAL LOW (ref 3.87–5.11)
RDW: 13.3 % (ref 11.5–15.5)
WBC: 8.5 10*3/uL (ref 4.0–10.5)

## 2012-10-07 LAB — COMPREHENSIVE METABOLIC PANEL
Albumin: 3.5 g/dL (ref 3.5–5.2)
Alkaline Phosphatase: 61 U/L (ref 39–117)
BUN: 12 mg/dL (ref 6–23)
Calcium: 9.4 mg/dL (ref 8.4–10.5)
Creatinine, Ser: 0.54 mg/dL (ref 0.50–1.10)
GFR calc Af Amer: >90 mL/min (ref >90)
Glucose, Bld: 97 mg/dL (ref 70–99)
Potassium: 3.8 mEq/L (ref 3.5–5.1)
Total Protein: 7.5 g/dL (ref 6.0–8.3)

## 2012-10-07 MED ORDER — ALPRAZOLAM 0.5 MG PO TABS: 1.0000 mg | ORAL_TABLET | Freq: Three times a day (TID) | ORAL | Status: DC | PRN

## 2012-10-07 MED ORDER — ACETAMINOPHEN 325 MG PO TABS: 650.0000 mg | ORAL_TABLET | Freq: Four times a day (QID) | ORAL | Status: DC | PRN

## 2012-10-07 MED ORDER — ENOXAPARIN SODIUM 40 MG/0.4ML ~~LOC~~ SOLN
40.0000 mg | SUBCUTANEOUS | Status: DC
  Administered 2012-10-07: 40 mg via SUBCUTANEOUS
  Filled 2012-10-07: qty 0.4

## 2012-10-07 MED ORDER — AMLODIPINE BESYLATE 10 MG PO TABS
10.0000 mg | ORAL_TABLET | Freq: Every day | ORAL | Status: DC
  Administered 2012-10-08: 10 mg via ORAL
  Filled 2012-10-07: qty 1

## 2012-10-07 MED ORDER — ASPIRIN 81 MG PO TBEC: 81.0000 mg | DELAYED_RELEASE_TABLET | Freq: Every day | ORAL | Status: DC

## 2012-10-07 MED ORDER — IOHEXOL 300 MG/ML  SOLN
25.0000 mL | INTRAMUSCULAR | Status: AC
  Administered 2012-10-07 (×2): 25 mL via ORAL

## 2012-10-07 MED ORDER — TRAVOPROST (BAK FREE) 0.004 % OP SOLN
1.0000 [drp] | Freq: Every day | OPHTHALMIC | Status: DC
  Administered 2012-10-07: 1 [drp] via OPHTHALMIC
  Filled 2012-10-07: qty 2.5

## 2012-10-07 MED ORDER — TRAMADOL HCL 50 MG PO TABS
100.0000 mg | ORAL_TABLET | Freq: Four times a day (QID) | ORAL | Status: DC | PRN
  Administered 2012-10-07 – 2012-10-08 (×2): 100 mg via ORAL
  Filled 2012-10-07: qty 1
  Filled 2012-10-07: qty 2

## 2012-10-07 MED ORDER — HYDROCHLOROTHIAZIDE 25 MG PO TABS
25.0000 mg | ORAL_TABLET | Freq: Every day | ORAL | Status: DC
  Administered 2012-10-08: 25 mg via ORAL
  Filled 2012-10-07: qty 1

## 2012-10-07 MED ORDER — ASPIRIN EC 81 MG PO TBEC
81.0000 mg | DELAYED_RELEASE_TABLET | Freq: Every day | ORAL | Status: DC
  Administered 2012-10-08: 81 mg via ORAL
  Filled 2012-10-07: qty 1

## 2012-10-07 MED ORDER — IBUPROFEN 200 MG PO TABS
200.0000 mg | ORAL_TABLET | Freq: Four times a day (QID) | ORAL | Status: DC | PRN
  Filled 2012-10-07: qty 1

## 2012-10-07 MED ORDER — TRAMADOL HCL 50 MG PO TABS
100.0000 mg | ORAL_TABLET | Freq: Four times a day (QID) | ORAL | Status: DC | PRN
  Filled 2012-10-07 (×2): qty 2

## 2012-10-07 MED ORDER — BENAZEPRIL HCL 40 MG PO TABS
40.0000 mg | ORAL_TABLET | Freq: Every day | ORAL | Status: DC
  Administered 2012-10-07: 40 mg via ORAL
  Filled 2012-10-07 (×2): qty 1

## 2012-10-07 MED ORDER — SODIUM CHLORIDE 0.9 % IV SOLN
INTRAVENOUS | Status: DC
  Administered 2012-10-07 – 2012-10-08 (×2): via INTRAVENOUS

## 2012-10-07 MED ORDER — ACETAMINOPHEN 325 MG PO TABS
650.0000 mg | ORAL_TABLET | Freq: Four times a day (QID) | ORAL | Status: DC | PRN
  Administered 2012-10-08: 650 mg via ORAL
  Filled 2012-10-07: qty 2

## 2012-10-07 MED ORDER — ACETAMINOPHEN 650 MG RE SUPP: 650.0000 mg | Freq: Four times a day (QID) | RECTAL | Status: DC | PRN

## 2012-10-07 MED ORDER — EFAVIRENZ-EMTRICITAB-TENOFOVIR 600-200-300 MG PO TABS
1.0000 | ORAL_TABLET | Freq: Every day | ORAL | Status: DC
  Administered 2012-10-07: 1 via ORAL
  Filled 2012-10-07 (×2): qty 1

## 2012-10-07 MED ORDER — CYCLOBENZAPRINE HCL 10 MG PO TABS
10.0000 mg | ORAL_TABLET | Freq: Three times a day (TID) | ORAL | Status: DC | PRN
  Filled 2012-10-07: qty 1

## 2012-10-07 MED ORDER — SIMVASTATIN 20 MG PO TABS
20.0000 mg | ORAL_TABLET | Freq: Every day | ORAL | Status: DC
  Administered 2012-10-07: 20 mg via ORAL
  Filled 2012-10-07 (×2): qty 1

## 2012-10-07 NOTE — H&P (Signed)
Date: 10/07/2012               Patient Name:  Jessica [name] MRN: [id number]  DOB: 19-Mar-1940 Age / Sex: 72 y.o., female   PCP: Rocco Serene, MD         Medical Service: Internal Medicine Teaching Service         Attending Physician: Dr. Rocco Serene, MD    First Contact: Dr. Darci Needle Pager: [phone]  Second Contact: Dr. 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 Patient reports that the current episode of LLQ abd pain began after she had a hard bowel movement last Wednesday. The pain is constant and dull in nature and at times wakes her up from sleep, improves with heating pad. She has been taking increasing doses of Vicodin at home (3 pills of Vicodin 10-325 per day x 3 weeks. She denies passing gas or having a BM over the pain began. She has been tolerating PO well. She denies N/V/D, F/C, CP, SOB, BLE edema, dysuria. Patient describes having a similar episode of LLQ abd pain 3 weeks ago that also began after a hard BM and lasted for four days and resolved on its own and was associated with one episode of N/V. She does not feel that this episode feels similar to her prior SBOs. Patient has h/o chronic constipation and has 1 BM per week at baseline and she admits that she has to take a lot of laxatives and drink caffeine to make herself have BMs.   In clinic today patient had an abd xray that showed large amount of stool but no sign of obstructive gas pattern.   Meds:  Current Outpatient Prescriptions  Medication Sig Dispense Refill  . acyclovir (ZOVIRAX) 400 MG tablet  Take 1 tablet (400 mg total) by mouth 3 (three) times daily. as needed for herpes outbreak  21 tablet  prn  . alendronate (FOSAMAX) 70 MG tablet Take 1 tablet (70 mg total) by mouth every 7 (seven) days. Sit upright, take on empty stomach with 8 oz water.  Do not eat/drink for 1 hour.  12 tablet  3  . ALPRAZolam (XANAX) 1 MG tablet Take 1 tablet (1 mg total) by mouth 3 (three) times daily as needed for anxiety.  90 tablet  0  . amLODipine (NORVASC) 10 MG tablet Take 1 tablet (10 mg total) by mouth daily.  90 tablet  3  . aspirin 81 MG EC tablet Take 81 mg by mouth daily.        . benazepril (LOTENSIN) 40 MG tablet Take 40 mg by mouth at bedtime.      . calcium carbonate (OS-CAL - DOSED IN MG OF ELEMENTAL CALCIUM) 1250 MG tablet Take 1 tablet by mouth daily.      . cyclobenzaprine (FLEXERIL) 10 MG tablet Take 1 tablet (10 mg total) by mouth 3 (three) times daily as needed for muscle spasms.  90 tablet  11  . docusate sodium (COLACE) 100 MG capsule Take 1 capsule (100 mg total) by mouth daily as  needed for constipation.  30 capsule  1  . efavirenz-emtricitabine-tenofovir (ATRIPLA) 600-200-300 MG per tablet Take 1 tablet by mouth at bedtime.  30 tablet  prn  . ENSURE (ENSURE) Take 237 mLs by mouth 2 (two) times daily.  237 mL  11  . fentaNYL (DURAGESIC) 50 MCG/HR Place 1 patch (50 mcg total) onto the skin every 3 (three) days.  10 patch  0  . hydrochlorothiazide (HYDRODIURIL) 25 MG tablet Take 1 tablet (25 mg total) by mouth daily.  90 tablet  3  . HYDROcodone-acetaminophen (NORCO) 10-325 MG per tablet Take 1 tablet by mouth every 6 (six) hours as needed for pain.  60 tablet  0  . ibuprofen (MOTRIN IB) 200 MG tablet Take 1 tablet (200 mg total) by mouth every 6 (six) hours as needed for pain.  100 tablet  2  . lovastatin (MEVACOR) 40 MG tablet Take 1 tablet (40 mg total) by mouth at bedtime.  30 tablet  5  . megestrol (MEGACE) 40 MG tablet Take 2 tablets (80 mg total) by mouth 2 (two) times daily.  120  tablet  3  . Multiple Vitamin (MULTIVITAMIN WITH MINERALS) TABS Take 1 tablet by mouth daily.      Marland Kitchen senna-docusate (SENOKOT-S) 8.6-50 MG per tablet Take 2 tablets by mouth 2 (two) times daily.  120 tablet  1  . Travoprost, BAK Free, (TRAVATAN) 0.004 % SOLN ophthalmic solution Place 1 drop into both eyes at bedtime.  1 Bottle  0  . triamcinolone ointment (KENALOG) 0.1 % Apply 1 application topically 2 (two) times daily.  454 g  3  . vitamin D, CHOLECALCIFEROL, 400 UNITS tablet Take 2 tablets (800 Units total) by mouth daily.  180 tablet  3   Allergies: Allergies as of 10/07/2012  . (No Known Allergies)   Past Medical History  Diagnosis Date  . Glaucoma     left  . Essential hypertension   . Hyperlipidemia LDL goal < 100   . SBO (small bowel obstruction)     In 12/2010, required small bowel resection with  anastamosis - required 2/2 SBO due to adhesions  //  02/2012 - partial SBO 2/2 adhesions s/p exploratory laparotomy and adhesionolysis  . Peripheral vascular disease     s/p aortofemoral artery bypass  . 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 Chronic lower back pain     lumbar degnerative disc disease  . Tobacco abuse   . Diverticulosis     significant left-sided transverse diverticula as per colonoscopy 05/2010 by Dr. Eula Listen  . Hyperplastic colonic polyp 05/12/2010    Single polyp excised via colonoscopy  . Anxiety   . Blood transfusion without reported diagnosis   . Cataract     Right eye  . Heart murmur 1961  . Osteoporosis     DEXA 04/15/2012: L1-L4 T -3.9, R Femur T - 3.0  . Genital herpes   . Seborrhea   . Constipation due to pain medication   . Voiding dysfunction     s/p cystoscopy and meatal dilation Dec 2005   Past Surgical History  Procedure Laterality Date  . Eye surgery    . Aorto-femoral bypass graft  04/2007  . Appendectomy    . Cholecystectomy    . Colectomy  01/2011    Dr. Jamey Ripa; "took out 12 inches of small intestiines and removed blockage"  . Abdominal  hysterectomy    . Laparotomy  02/12/2012    Procedure: EXPLORATORY LAPAROTOMY;  Surgeon: Almond Lint, MD;  Location: [facility] OR;  Service: General;  Laterality: N/A;  Exploratory Laparotomy, lysis of adhesions  . Breast surgery      Breast biopsy: negative  . Small intestine surgery     Family History  Problem Relation Age of Onset  . Kidney failure Mother   . Diabetes Mother   . Hypertension Mother   . Heart disease Mother   . Glaucoma Father   . Congestive Heart Failure Sister   . Diabetes Sister   . Kidney disease Sister   . Diabetes Brother   . Unexplained death Brother 73    Automobile accident  . Hypothyroidism Daughter   . Arthritis Daughter     Neck/Back  . Healthy Son   . HIV/AIDS Brother   . HIV Daughter   . Kidney disease Daughter   . Arthritis Son     Knee   History   Social History  . Marital Status: Widowed    Spouse Name: N/A    Number of Children: 4  . Years of Education: 2y college   Occupational History  . 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    Social History Main Topics  . Smoking status: Current Every Day Smoker -- 0.50 packs/day for 50 years    Types: Cigarettes  . Smokeless tobacco: Never Used  . Alcohol Use: No     Comment: "last drink of alcohol ~ 1977"  . Drug Use: No  . Sexually Active: No   Other Topics Concern  . Not on file   Social History Narrative   Lives in [place], [place] with daughter and nephew    Review of Systems: General: no fevers, chills, changes in weight, changes in appetite Skin: no rash HEENT: no blurry vision, hearing changes, sore throat Pulm: no dyspnea, coughing, wheezing CV: no chest pain, palpitations, shortness of breath Abd: See HPI GU: no dysuria, hematuria, +urgency at baseline Ext: no arthralgias, myalgias Neuro: no weakness, numbness, or tingling  Physical Exam: Vital signs: Temp 98.9F, BP 146/73, HR 70, No RR recorded, SpO2 98% r/a General: thin woman who is alert, cooperative,  and in no apparent distress HEENT: pupils equal round and reactive to light, vision grossly intact, oropharynx clear and non-erythematous; MMM  Neck: supple, no lymphadenopathy Lungs: clear to ascultation bilaterally, normal work of respiration, no wheezes, rales, ronchi Heart: regular rate and rhythm, no murmurs, gallops, or rubs Abdomen: flat, soft, tenderness to palpation worst over LLQ and periumbilical area, some TTP over RLQ, but much less severe than LLQ, +rebound tenderness over LLQ, no bowel sounds heard over 1 minute Extremities: warm extremities, no cyanosis, clubbing, or edema Neurologic: alert & oriented X3, cranial nerves II-XII grossly intact, strength grossly intact, sensation intact to light touch  Lab results: Basic Metabolic Panel: No results found for this basename: NA, K, CL, CO2, GLUCOSE, BUN, CREATININE, CALCIUM, MG, PHOS,  in the last 72 hours Liver Function Tests: No results found for this basename: AST, ALT, ALKPHOS, BILITOT, PROT, ALBUMIN,  in the last 72 hours No results found for this basename: LIPASE, AMYLASE,  in the last 72 hours No results found for this basename: AMMONIA,  in the last 72 hours CBC: No results found for this basename: WBC, NEUTROABS, HGB, HCT, MCV, PLT,  in the last 72 hours  Imaging results:  Dg Abd 2 Views  10/07/2012   *RADIOLOGY REPORT*  Clinical Data: Abdominal pain, chronic constipation, history of small bowel obstruction  ABDOMEN -  2 VIEW  Comparison: 02/12/2012  Findings: Nonobstructive bowel gas pattern.  Moderate to large colonic stool burden, suggesting constipation.  No evidence of free air under the diaphragm on the upright view.  Cholecystectomy clips.  IMPRESSION: No evidence of small bowel obstruction or free air.  Moderate to large colonic stool burden, suggesting constipation.   Original Report Authenticated By: Charline Bills, M.D.   Assessment & Plan by Problem: This is a 72yo AA woman with h/o HIV (last CD4 1320 09/2012),  HTN, PVD, chronic constipation, diverticulosis, DDD, two prior SBOs w/ multiple abd surgeries who presents to clinic with c/o abdominal pain and constipation with +rebound tenderness concerning for diverticulosis vs severe constipation.   # Abdominal Pain: Given patient's history of diverticulosis and that her pain is most severe over LLQ and that she has rebound tenderness on exam, diverticulitis is the leading diagnosis at this point even though patient is afebrile and with unknown WBC count at this time. Patient has prior h/o SBOs w/ multiple abd surgeries and is presenting with obstipation since Wednesday, though abdominal xray does not display obstructive gas pattern so this is less likely so do not think it is necessary to treat as SBO at this time. Patient is history of chronic constipation and abdominal xray showed significant stool, so this episode of abd pain may represent constipation, which is likely being exacerbated by recent increase in narcotic usage.   - CMP, CBC w/ diff- BMet in AM - UA w/ reflex UCx - CT abdomen/pelvis w/ contrast - NPO  - IVF NS @ 100cc/hr -ibuprofen and tramadol 100mg  daily prn pain- avoiding narcotics since this can worsen constipation  # HTN: BP 146/73 on admission. Will continue home regimen. -amlodipine 10mg  daily, benazepril 40mg  daily, HCTZ 25mg  daily  # HIV: Last CD4 count 1320 09/2012. Continue home medications. -atripla   # Hyperlipidemia: Takes lovastatin 40mg  daily at home- ordered simvastatin 20mg  daily per formulary   # VTE: lovenox  # Diet: NPO  Code status: Full  Dispo: Disposition is deferred at this time, awaiting improvement of current medical problems. Anticipated discharge in approximately 2-3 day(s).   The patient does have a current PCP Rocco Serene, MD) and does not need an [facility] hospital follow-up appointment after discharge.  The patient does not have transportation limitations that hinder transportation to clinic  appointments.  Signed: Windell Hummingbird, MD 10/07/2012, 5:03 PM

## 2012-10-07 NOTE — Telephone Encounter (Signed)
Pt in the office (brought her daughter for lab appointment). Wanted to let Dr. Drue Second know that she is still having pains after large bowel movements (happened one more time after her office visit last week).  Patient states she now feels this is a flare up of her previously-diagnosed diverticulitis.  Patient advised to contact her PCP Dr. Josem Kaufmann for further management.  Pt verbalized understanding, stated she was unsure how soon his office would be able to see her.  RN advised her to call us back if she has any trouble, but that a PCP would be better to manage a chronic condition like diverticulitis.  Pt in agreement. Andree Coss, RN

## 2012-10-07 NOTE — Progress Notes (Signed)
Pt walked into clinic with c/o abd pain.  Onset 3 weeks ago after having a "hard" BM.  Pain did get better but returned. Last week BM on Tuesday and abd pain returned.   Hx:  diverticulitis and 2 surgeries on small intestine.  Since Wed abd remains sore.  Rates pain as soreness.  Constant.  Only thing that helps pain is heating pad.   pain does increase with  BM and bending.  She has not taken any thing for pain.   Will see today

## 2012-10-07 NOTE — Progress Notes (Signed)
Subjective:   Patient ID: Jessica [name] female   DOB: 06-20-1940 72 y.o.   MRN: [id number]  HPI: Jessica [name] is a 72 y.o. female with a pmhx of HIV, SBO, multiple abdominal surgeries, chronic constipation and chronic pain on opioid therapy who comes to the clinic with a cc of abdominal pain. The patient reports that she had abdominal pain for 4 days starting two weeks ago. The pain started after she had a BM. Of note, the patient has about 1 BM per week at baseline. The patient denies blood in the stool during this BM. The pain was constant and progressive until 4 days later when it was relieved. A few days later (5 days ago), the patient had a BM and the pain started again. The pain is a 6/10 and is constant. It is most intense in her LLQ but extends across her entire lower abdomen. She explains that it is similar to the early aspects of her previous episodes of SBO, but not as bad. The patient admits 1 episode of vomiting about 10 days ago. The patient denies passing gas. She is tolerating food and drinking fluids without problems. She reports that she has increased her Vicodin from 2 pills per day to 3 pills per day due to the pain in her stomach. The patient denies colicky pain, blood in her stool, and nausea. The patient also denies fevers and chills.    Past Medical History  Diagnosis Date  . Glaucoma     left  . Essential hypertension   . Hyperlipidemia LDL goal < 100   . SBO (small bowel obstruction)     In 12/2010, required small bowel resection with  anastamosis - required 2/2 SBO due to adhesions  //  02/2012 - partial SBO 2/2 adhesions s/p exploratory laparotomy and adhesionolysis  . Peripheral vascular disease     s/p aortofemoral artery bypass  . 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 Chronic lower back pain     lumbar degnerative disc disease  . Tobacco abuse   . Diverticulosis     significant left-sided transverse diverticula as per colonoscopy 05/2010 by Dr.  Eula Listen  . Hyperplastic colonic polyp 05/12/2010    Single polyp excised via colonoscopy  . Anxiety   . Blood transfusion without reported diagnosis   . Cataract     Right eye  . Heart murmur 1961  . Osteoporosis     DEXA 04/15/2012: L1-L4 T -3.9, R Femur T - 3.0  . Genital herpes   . Seborrhea   . Constipation due to pain medication   . Voiding dysfunction     s/p cystoscopy and meatal dilation Dec 2005   Current Outpatient Prescriptions  Medication Sig Dispense Refill  . acyclovir (ZOVIRAX) 400 MG tablet Take 1 tablet (400 mg total) by mouth 3 (three) times daily. as needed for herpes outbreak  21 tablet  prn  . alendronate (FOSAMAX) 70 MG tablet Take 1 tablet (70 mg total) by mouth every 7 (seven) days. Sit upright, take on empty stomach with 8 oz water.  Do not eat/drink for 1 hour.  12 tablet  3  . ALPRAZolam (XANAX) 1 MG tablet Take 1 tablet (1 mg total) by mouth 3 (three) times daily as needed for anxiety.  90 tablet  0  . amLODipine (NORVASC) 10 MG tablet Take 1 tablet (10 mg total) by mouth daily.  90 tablet  3  . aspirin 81 MG EC tablet Take 81 mg by  mouth daily.        . benazepril (LOTENSIN) 40 MG tablet Take 40 mg by mouth at bedtime.      . calcium carbonate (OS-CAL - DOSED IN MG OF ELEMENTAL CALCIUM) 1250 MG tablet Take 1 tablet by mouth daily.      . cyclobenzaprine (FLEXERIL) 10 MG tablet Take 1 tablet (10 mg total) by mouth 3 (three) times daily as needed for muscle spasms.  90 tablet  11  . efavirenz-emtricitabine-tenofovir (ATRIPLA) 600-200-300 MG per tablet Take 1 tablet by mouth at bedtime.  30 tablet  prn  . ENSURE (ENSURE) Take 237 mLs by mouth 2 (two) times daily.  237 mL  11  . fentaNYL (DURAGESIC) 50 MCG/HR Place 1 patch (50 mcg total) onto the skin every 3 (three) days.  10 patch  0  . hydrochlorothiazide (HYDRODIURIL) 25 MG tablet Take 1 tablet (25 mg total) by mouth daily.  90 tablet  3  . HYDROcodone-acetaminophen (NORCO) 10-325 MG per tablet Take 1  tablet by mouth every 6 (six) hours as needed for pain.  60 tablet  0  . ibuprofen (MOTRIN IB) 200 MG tablet Take 1 tablet (200 mg total) by mouth every 6 (six) hours as needed for pain.  100 tablet  2  . lovastatin (MEVACOR) 40 MG tablet Take 1 tablet (40 mg total) by mouth at bedtime.  30 tablet  5  . megestrol (MEGACE) 40 MG tablet Take 2 tablets (80 mg total) by mouth 2 (two) times daily.  120 tablet  3  . Multiple Vitamin (MULTIVITAMIN WITH MINERALS) TABS Take 1 tablet by mouth daily.      . Travoprost, BAK Free, (TRAVATAN) 0.004 % SOLN ophthalmic solution Place 1 drop into both eyes at bedtime.  1 Bottle  0  . triamcinolone ointment (KENALOG) 0.1 % Apply 1 application topically 2 (two) times daily.  454 g  3  . vitamin D, CHOLECALCIFEROL, 400 UNITS tablet Take 2 tablets (800 Units total) by mouth daily.  180 tablet  3  . docusate sodium (COLACE) 100 MG capsule Take 1 capsule (100 mg total) by mouth daily as needed for constipation.  30 capsule  1  . senna-docusate (SENOKOT-S) 8.6-50 MG per tablet Take 2 tablets by mouth 2 (two) times daily.  120 tablet  1   No current facility-administered medications for this visit.   Family History  Problem Relation Age of Onset  . Kidney failure Mother   . Diabetes Mother   . Hypertension Mother   . Heart disease Mother   . Glaucoma Father   . Congestive Heart Failure Sister   . Diabetes Sister   . Kidney disease Sister   . Diabetes Brother   . Unexplained death Brother 27    Automobile accident  . Hypothyroidism Daughter   . Arthritis Daughter     Neck/Back  . Healthy Son   . HIV/AIDS Brother   . HIV Daughter   . Kidney disease Daughter   . Arthritis Son     Knee   History   Social History  . Marital Status: Widowed    Spouse Name: N/A    Number of Children: 4  . Years of Education: 2y college   Occupational History  . 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    Social History Main Topics  . Smoking  status: Current Every Day Smoker -- 0.50 packs/day for 50 years    Types: Cigarettes  .  Smokeless tobacco: Never Used  . Alcohol Use: No     Comment: "last drink of alcohol ~ 1977"  . Drug Use: No  . Sexually Active: No   Other Topics Concern  . None   Social History Narrative   Lives in [place], [place] with daughter and nephew   Review of Systems:  Pertinent items are noted in HPI.  Objective:  Physical Exam: Filed Vitals:   10/07/12 1123  BP: 146/73  Pulse: 70  Temp: 98.2 F (36.8 C)  TempSrc: Oral  Height: 5\' 8"  (1.727 m)  Weight: 126 lb 6.4 oz (57.335 kg)  SpO2: 98%   Physical Exam  Constitutional: She appears well-developed and well-nourished.  HENT:  Head: Normocephalic and atraumatic.  Mouth/Throat: Oropharynx is clear and moist. No oropharyngeal exudate.  Eyes: Conjunctivae and EOM are normal. Pupils are equal, round, and reactive to light.  Cardiovascular: Normal rate, regular rhythm, normal heart sounds and intact distal pulses.  Exam reveals no gallop and no friction rub.   No murmur heard. Pulmonary/Chest: Effort normal and breath sounds normal. No respiratory distress. She has no wheezes. She has no rales.  Abdominal: Soft. There is tenderness.  The patient is very tender to palpation of her LLQ and RLQ, worse in left. There are loud bowel sounds in the epigastrium and no bowel sounds in her RLQ and LLQ.     Assessment & Plan:   1. Abdominal Pain  The patient's abdominal is possibly due to diverticulitis or constipation, however, other etiologies are unable to be ruled out at this time. As x-ray demontrated a non-obstructed pattern, SBO is unlikely. Due to the severity of the patients pain and failure of home stool softeners, we recommended that the patient be admitted overnight for observation under the care of the inpatient internal medicine team. The patient voiced understanding of the plan and agreed to be admitted to the hospital. I contacted the inpatient  team and they agreed to admit the patient.

## 2012-10-08 ENCOUNTER — Observation Stay (HOSPITAL_COMMUNITY): Payer: Medicare Other

## 2012-10-08 ENCOUNTER — Other Ambulatory Visit: Payer: Self-pay | Admitting: Internal Medicine

## 2012-10-08 DIAGNOSIS — K5909 Other constipation: Secondary | ICD-10-CM

## 2012-10-08 DIAGNOSIS — K5730 Diverticulosis of large intestine without perforation or abscess without bleeding: Secondary | ICD-10-CM | POA: Diagnosis not present

## 2012-10-08 LAB — URINALYSIS, ROUTINE W REFLEX MICROSCOPIC
Glucose, UA: NEGATIVE mg/dL
pH: 7.5 (ref 5.0–8.0)

## 2012-10-08 LAB — URINE MICROSCOPIC-ADD ON

## 2012-10-08 LAB — BASIC METABOLIC PANEL
CO2: 19 mEq/L (ref 19–32)
Chloride: 107 mEq/L (ref 96–112)
Creatinine, Ser: 0.46 mg/dL — ABNORMAL LOW (ref 0.50–1.10)
Glucose, Bld: 90 mg/dL (ref 70–99)

## 2012-10-08 MED ORDER — TRAMADOL HCL 50 MG PO TABS: 100.0000 mg | ORAL_TABLET | Freq: Four times a day (QID) | ORAL | Status: DC | PRN

## 2012-10-08 MED ORDER — IOHEXOL 300 MG/ML  SOLN
100.0000 mL | Freq: Once | INTRAMUSCULAR | Status: AC | PRN
  Administered 2012-10-08: 100 mL via INTRAVENOUS

## 2012-10-08 MED ORDER — SENNOSIDES-DOCUSATE SODIUM 8.6-50 MG PO TABS: 2.0000 | ORAL_TABLET | Freq: Two times a day (BID) | ORAL | Status: DC

## 2012-10-08 MED ORDER — SORBITOL 70 % SOLN
15.0000 mL | Freq: Every day | Status: DC | PRN
  Administered 2012-10-08: 15 mL via ORAL
  Filled 2012-10-08: qty 30

## 2012-10-08 MED ORDER — SORBITOL 70 % SOLN: 15.0000 mL | Freq: Every day | Status: DC | PRN

## 2012-10-08 MED ORDER — POTASSIUM CHLORIDE CRYS ER 20 MEQ PO TBCR
40.0000 meq | EXTENDED_RELEASE_TABLET | Freq: Once | ORAL | Status: AC
Start: 2012-10-08 — End: 2012-10-08
  Administered 2012-10-08: 40 meq via ORAL
  Filled 2012-10-08: qty 1

## 2012-10-08 NOTE — Telephone Encounter (Signed)
Please call in tramadol prescription to [facility] in [place], [place].  Thanks.

## 2012-10-08 NOTE — Discharge Summary (Signed)
Name: Jessica [name] MRN: [id number] DOB: [birth year] 72 y.o. PCP: Rocco Serene, MD  Date of Admission: 10/07/2012  4:53 PM Date of Discharge: 10/08/2012 Attending Physician: Rocco Serene, MD  Discharge Diagnosis: Principal Problem:   Constipation likely due to narcotic pain medication Active Problems:   HIV   Essential hypertension   Diverticulosis   Hyperlipidemia  Discharge Medications:   Medication List         acyclovir 400 MG tablet  Commonly known as:  ZOVIRAX  Take 400 mg by mouth 3 (three) times daily. As needed for herpes outbreak.  Uses for 7 days.     alendronate 70 MG tablet  Commonly known as:  FOSAMAX  Take 70 mg by mouth every 7 (seven) days. Take with a full glass of water on an empty stomach.     ALPRAZolam 1 MG tablet  Commonly known as:  XANAX  Take 1 tablet (1 mg total) by mouth 3 (three) times daily as needed for anxiety.     amLODipine 10 MG tablet  Commonly known as:  NORVASC  Take 1 tablet (10 mg total) by mouth daily.     aspirin 81 MG EC tablet  Take 81 mg by mouth daily.     benazepril 40 MG tablet  Commonly known as:  LOTENSIN  Take 40 mg by mouth at bedtime.     calcium-vitamin D 500-200 MG-UNIT per tablet  Commonly known as:  OSCAL WITH D  Take 1 tablet by mouth 2 (two) times daily.     cyclobenzaprine 10 MG tablet  Commonly known as:  FLEXERIL  Take 1 tablet (10 mg total) by mouth 3 (three) times daily as needed for muscle spasms.     docusate sodium 100 MG capsule  Commonly known as:  COLACE  Take 100 mg by mouth 2 (two) times daily.     efavirenz-emtricitabine-tenofovir 600-200-300 MG per tablet  Commonly known as:  ATRIPLA  Take 1 tablet by mouth at bedtime.     ENSURE  Take 237 mLs by mouth 2 (two) times daily.     fentaNYL 50 MCG/HR  Commonly known as:  DURAGESIC  Place 1 patch (50 mcg total) onto the skin every 3 (three) days.     hydrochlorothiazide 25 MG tablet  Commonly known as:   HYDRODIURIL  Take 1 tablet (25 mg total) by mouth daily.     HYDROcodone-acetaminophen 10-325 MG per tablet  Commonly known as:  NORCO  Take 1 tablet by mouth 3 (three) times daily as needed for pain.     ibuprofen 200 MG tablet  Commonly known as:  ADVIL,MOTRIN  Take 400 mg by mouth 3 (three) times daily as needed for pain.     lovastatin 40 MG tablet  Commonly known as:  MEVACOR  Take 1 tablet (40 mg total) by mouth at bedtime.     megestrol 40 MG tablet  Commonly known as:  MEGACE  Take 2 tablets (80 mg total) by mouth 2 (two) times daily.     multivitamin with minerals Tabs  Take 1 tablet by mouth daily.     sorbitol 70 % Soln  Take 15 mLs by mouth daily as needed (mix with coffee, juice, water).     traMADol 50 MG tablet  Commonly known as:  ULTRAM  Take 2 tablets (100 mg total) by mouth every 6 (six) hours as needed.     travoprost (benzalkonium) 0.004 % ophthalmic solution  Commonly known as:  TRAVATAN  Place 1 drop into the left eye at bedtime.     triamcinolone ointment 0.1 %  Commonly known as:  KENALOG  Apply 1 application topically 2 (two) times daily.       Disposition and follow-up:   [name] was discharged from [facility] in Stable condition.  At the hospital follow up visit please address:  1.  Constipation- Please evaluate patient's use of sorbitol and encourage her to decrease use of narcotics. 2. Abdominal pain- Please assess that patient's abdominal pain has resolved and that she has decreased amount of narcotics she had been taking.  2.  Labs / imaging needed at time of follow-up: none  3.  Pending labs/ test needing follow-up: none  Follow-up Appointments:     Follow-up Information   Follow up with Boykin Peek, MD On 10/16/2012. (2:00pm)    Contact information:   [address] [place] [facility] [place] [address] [phone]      Discharge Instructions: Discharge  Orders   Future Appointments Provider Department Dept Phone   10/16/2012 2:00 PM Boykin Peek, MD [facility] [phone]   11/05/2012 8:45 AM Iran Ouch, MD [facility] [place]) [phone]   11/05/2012 9:00 AM [facility] [phone]   11/14/2012 1:45 PM Randall Hiss, MD [facility] [phone]   12/13/2012 9:45 AM Rocco Serene, MD [facility] [phone]   Future Orders Complete By Expires     Call MD for:  persistant nausea and vomiting  As directed     Call MD for:  severe uncontrolled pain  As directed     Diet - low sodium heart healthy  As directed     Increase activity slowly  As directed       Consultations:  none  Procedures Performed:  Ct Abdomen Pelvis W Contrast  10/08/2012   *RADIOLOGY REPORT*  Clinical Data:  Abdominal pain.  CT ABDOMEN AND PELVIS WITH CONTRAST  Technique:  Multidetector CT imaging of the abdomen and pelvis was performed following the standard protocol during bolus administration of intravenous contrast.  Contrast: OMNIPAQUE IOHEXOL 300 MG/ML  SOLN.  Oral contrast.  Comparison: 02/08/2012.  Findings:  BODY WALL: Unremarkable.  LOWER CHEST:  Mediastinum: Coronary artery atherosclerosis.  Mild circumferential distal esophageal thickening.  Lungs/pleura: Mild scarring or atelectasis at the bases.  No consolidation.  ABDOMEN/PELVIS:  Liver: No focal abnormality.  Biliary: Cholecystectomy.  Chronic biliary dilatation, likely reservoir effect.  Pancreas: Unremarkable.  Spleen: Unremarkable.  Adrenals: Unremarkable.  Kidneys and ureters: Excreted contrast within the upper collecting systems, which may obscure small stones.  No hydronephrosis. Symmetric renal enhancement.  Bladder: Unremarkable.  Bowel: No obstruction. Extensive colonic diverticulosis.  No evident diverticulitis.  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 No appendicitis, with the majority of the appendix filled with contrast.  Retroperitoneum: No mass or adenopathy.  Peritoneum: No free fluid or gas.  Reproductive: Hysterectomy.  Vascular: Patent aortobifemoral bypass.  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  OSSEOUS: No acute abnormalities. No suspicious lytic or blastic lesions.  IMPRESSION: 1.  No evidence of acute intra-abdominal disease. 2.  Colonic diverticulosis.   Original Report Authenticated By: Tiburcio Pea   Dg Abd 2 Views  10/07/2012   *RADIOLOGY REPORT*  Clinical Data: Abdominal pain, chronic constipation, history of small bowel obstruction  ABDOMEN - 2 VIEW  Comparison: 02/12/2012  Findings: Nonobstructive bowel gas pattern.  Moderate to large colonic stool burden, suggesting constipation.  No evidence of free air under the diaphragm on the upright view.  Cholecystectomy clips.  IMPRESSION: No evidence of small bowel obstruction or free air.  Moderate to large colonic stool burden, suggesting constipation.   Original Report Authenticated By: Charline Bills, M.D.   Admission HPI:  This is a 72yo AA woman with h/o HIV (last CD4 1320 09/2012), HTN, PVD, chronic constipation, diverticulosis, DDD, two prior SBOs w/ multiple abd surgeries who presents to clinic with c/o LLQ abd pain that radiates to RLQ x 5 days. Patient reports that the current episode of LLQ abd pain began after she had a hard bowel movement last Wednesday. The pain is constant and dull in nature and at times wakes her up from sleep, improves with heating pad. She has been taking increasing doses of Vicodin at home (3 pills of Vicodin 10-325 per day x 3 weeks. She denies passing gas or having a BM over the pain began. She has been tolerating PO well. She denies N/V/D, F/C, CP, SOB, BLE edema, dysuria. Patient describes having a similar episode of LLQ abd pain 3 weeks ago that also began after a hard BM and lasted  for four days and resolved on its own and was associated with one episode of N/V. She does not feel that this episode feels similar to her prior SBOs. Patient has h/o chronic constipation and has 1 BM per week at baseline and she admits that she has to take a lot of laxatives and drink caffeine to make herself have BMs.   In clinic today patient had an abd xray that showed large amount of stool but no sign of obstructive gas pattern.   Hospital Course by problem list:   1. Constipation with LLQ abdominal pain- Given patient's history of multiple SBOs in the past, we initially ruled out SBO with abdominal xray, which showed nonobstructive gas pattern with large amount of stool present, +gas in rectum. Although patient was afebrile and without leukocytosis, patient had significant rebound tenderness to LLQ on exam initially so we thought it prudent to rule out diverticulitis. We kept patient NPO w/ normal saline at 100cc/hr and obtained CT abd pelvis that revealed left sided diverticulosis without sign of diverticulitis. Once CT results were obtained, IVF were discontinued, diet was started, and patient was treated for constipation with her home regimen of Senokot and added a soap suds enema and sorbitol 70% 1tbsp po. Patient responded well to this regimen and had a bowel movement with reduced abdominal pain. Discussed at length with patient the importance of decreasing her narcotic use since this is likely contributing to her constipation. Of note, patient's potassium was 3.4 on morning of discharge so she was given KDUR po once. Upon discharge patient was instructed to up titrate the sorbitol as tolerated to up to 5 tbsp daily, but to go slowly and stop as she obtains the results desired. Patient was discharged with tramadol 100mg  po q6h prn for pain as this was managing her abdominal pain well and has potential to cause less constipation than the vicodin that she usually takes at home.   2. HTN- BP  well controlled. We continued home medication regimen: amlodipine 10mg  daily, benazepril 40mg  daily, HCTZ 25mg  daily.  3. Hyperlipidemia- Patient takes lovastatin 40mg  daily at home, so we managed her on simvastatin 20mg  daily per the formulary at  [facility].  Discharge Vitals:   BP 138/69  Pulse 64  Temp(Src) 99 F (37.2 C) (Oral)  Resp 16  SpO2 98%  Discharge Labs:  Results for orders placed during the hospital encounter of 10/07/12 (from the past 24 hour(s))  COMPREHENSIVE METABOLIC PANEL     Status: Abnormal   Collection Time    10/07/12  4:58 PM      Result Value Range   Sodium 135  135 - 145 mEq/L   Potassium 3.8  3.5 - 5.1 mEq/L   Chloride 102  96 - 112 mEq/L   CO2 16 (*) 19 - 32 mEq/L   Glucose, Bld 97  70 - 99 mg/dL   BUN 12  6 - 23 mg/dL   Creatinine, Ser 1.61  0.50 - 1.10 mg/dL   Calcium 9.4  8.4 - 09.6 mg/dL   Total Protein 7.5  6.0 - 8.3 g/dL   Albumin 3.5  3.5 - 5.2 g/dL   AST 25  0 - 37 U/L   ALT 17  0 - 35 U/L   Alkaline Phosphatase 61  39 - 117 U/L   Total Bilirubin 0.1 (*) 0.3 - 1.2 mg/dL   GFR calc non Af Amer >90  >90 mL/min   GFR calc Af Amer >90  >90 mL/min  CBC WITH DIFFERENTIAL     Status: Abnormal   Collection Time    10/07/12  4:58 PM      Result Value Range   WBC 8.5  4.0 - 10.5 K/uL   RBC 3.80 (*) 3.87 - 5.11 MIL/uL   Hemoglobin 13.1  12.0 - 15.0 g/dL   HCT 04.5  40.9 - 81.1 %   MCV 95.8  78.0 - 100.0 fL   MCH 34.5 (*) 26.0 - 34.0 pg   MCHC 36.0  30.0 - 36.0 g/dL   RDW 91.4  78.2 - 95.6 %   Platelets 291  150 - 400 K/uL   Neutrophils Relative % 35 (*) 43 - 77 %   Neutro Abs 3.0  1.7 - 7.7 K/uL   Lymphocytes Relative 59 (*) 12 - 46 %   Lymphs Abs 5.0 (*) 0.7 - 4.0 K/uL   Monocytes Relative 4  3 - 12 %   Monocytes Absolute 0.3  0.1 - 1.0 K/uL   Eosinophils Relative 2  0 - 5 %   Eosinophils Absolute 0.2  0.0 - 0.7 K/uL   Basophils Relative 1  0 - 1 %   Basophils Absolute 0.1  0.0 - 0.1 K/uL  BASIC METABOLIC PANEL     Status: Abnormal    Collection Time    10/08/12  4:55 AM      Result Value Range   Sodium 136  135 - 145 mEq/L   Potassium 3.4 (*) 3.5 - 5.1 mEq/L   Chloride 107  96 - 112 mEq/L   CO2 19  19 - 32 mEq/L   Glucose, Bld 90  70 - 99 mg/dL   BUN 10  6 - 23 mg/dL   Creatinine, Ser 2.13 (*) 0.50 - 1.10 mg/dL   Calcium 8.8  8.4 - 08.6 mg/dL   GFR calc non Af Amer >90  >90 mL/min   GFR calc Af Amer >90  >90 mL/min  URINALYSIS, ROUTINE W REFLEX MICROSCOPIC     Status: Abnormal   Collection Time    10/08/12  5:05 AM      Result Value Range   Color, Urine YELLOW  YELLOW  APPearance CLOUDY (*) CLEAR   Specific Gravity, Urine 1.005  1.005 - 1.030   pH 7.5  5.0 - 8.0   Glucose, UA NEGATIVE  NEGATIVE mg/dL   Hgb urine dipstick TRACE (*) NEGATIVE   Bilirubin Urine NEGATIVE  NEGATIVE   Ketones, ur NEGATIVE  NEGATIVE mg/dL   Protein, ur NEGATIVE  NEGATIVE mg/dL   Urobilinogen, UA 0.2  0.0 - 1.0 mg/dL   Nitrite POSITIVE (*) NEGATIVE   Leukocytes, UA SMALL (*) NEGATIVE  URINE MICROSCOPIC-ADD ON     Status: Abnormal   Collection Time    10/08/12  5:05 AM      Result Value Range   Squamous Epithelial / LPF RARE  RARE   WBC, UA 0-2  <3 WBC/hpf   RBC / HPF 0-2  <3 RBC/hpf   Bacteria, UA MANY (*) RARE    Signed: Windell Hummingbird, MD 10/08/2012, 1:02 PM   Time Spent on Discharge: 35 minutes Services Ordered on Discharge: none Equipment Ordered on Discharge: none

## 2012-10-08 NOTE — Progress Notes (Signed)
Subjective: Ms. Barbette Hair still complains of having some LLQ abd pain, but her pain has been well controlled on the tramadol. She has not had a bowel movement, but does endorse passing gas this morning. Discussed with patient the importance of cutting back on the narcotic pain medication she takes at home as this is likely causing or exacerbating her constipation. She still denies any dysuria, increased urinary frequency, suprapubic tenderness. CT scan was negative for diverticulosis.  Objective: Vital signs in last 24 hours: Filed Vitals:   10/07/12 2130 10/08/12 0228 10/08/12 0548 10/08/12 0959  BP: 126/68 116/44 107/51 138/69  Pulse: 66 65 76 64  Temp: 97 F (36.1 C) 99 F (37.2 C) 98.4 F (36.9 C) 99 F (37.2 C)  TempSrc: Oral Oral Oral Oral  Resp: 16 18 18 16   SpO2: 98% 97% 98% 98%    Intake/Output Summary (Last 24 hours) at 10/08/12 1116 Last data filed at 10/08/12 0956  Gross per 24 hour  Intake 796.67 ml  Output   1600 ml  Net -803.33 ml   Physical Exam General: thin, alert, cooperative, and in no apparent distress HEENT: pupils equal round and reactive to light, vision grossly intact, oropharynx clear and non-erythematous; MMM  Neck: supple, no lymphadenopathy Lungs: clear to ascultation bilaterally, normal work of respiration, no wheezes, rales, ronchi Heart: regular rate and rhythm, no murmurs, gallops, or rubs Abdomen: soft, TTP LLQ, bowel sounds present, non-distended Extremities: warm extremities, no BLE edema Neurologic: alert & oriented X3, cranial nerves II-XII grossly intact, strength grossly intact, sensation intact to light touch  Lab Results: Basic Metabolic Panel:  Recent Labs Lab 10/07/12 1658 10/08/12 0455  NA 135 136  K 3.8 3.4*  CL 102 107  CO2 16* 19  GLUCOSE 97 90  BUN 12 10  CREATININE 0.54 0.46*  CALCIUM 9.4 8.8   Liver Function Tests:  Recent Labs Lab 10/07/12 1658  AST 25  ALT 17  ALKPHOS 61  BILITOT 0.1*  PROT 7.5    ALBUMIN 3.5   CBC:  Recent Labs Lab 10/07/12 1658  WBC 8.5  NEUTROABS 3.0  HGB 13.1  HCT 36.4  MCV 95.8  PLT 291    Recent Labs Lab 10/08/12 0505  COLORURINE YELLOW  LABSPEC 1.005  PHURINE 7.5  GLUCOSEU NEGATIVE  HGBUR TRACE*  BILIRUBINUR NEGATIVE  KETONESUR NEGATIVE  PROTEINUR NEGATIVE  UROBILINOGEN 0.2  NITRITE POSITIVE*  LEUKOCYTESUR SMALL*   Studies/Results: Ct Abdomen Pelvis W Contrast  10/08/2012   *RADIOLOGY REPORT*  Clinical Data:  Abdominal pain.  CT ABDOMEN AND PELVIS WITH CONTRAST  Technique:  Multidetector CT imaging of the abdomen and pelvis was performed following the standard protocol during bolus administration of intravenous contrast.  Contrast: OMNIPAQUE IOHEXOL 300 MG/ML  SOLN.  Oral contrast.  Comparison: 02/08/2012.  Findings:  BODY WALL: Unremarkable.  LOWER CHEST:  Mediastinum: Coronary artery atherosclerosis.  Mild circumferential distal esophageal thickening.  Lungs/pleura: Mild scarring or atelectasis at the bases.  No consolidation.  ABDOMEN/PELVIS:  Liver: No focal abnormality.  Biliary: Cholecystectomy.  Chronic biliary dilatation, likely reservoir effect.  Pancreas: Unremarkable.  Spleen: Unremarkable.  Adrenals: Unremarkable.  Kidneys and ureters: Excreted contrast within the upper collecting systems, which may obscure small stones.  No hydronephrosis. Symmetric renal enhancement.  Bladder: Unremarkable.  Bowel: No obstruction. Extensive colonic diverticulosis.  No evident diverticulitis.  Previous small bowel resection with aneurysmal enteroenterostomy, stable from prior. No appendicitis, with the majority of the appendix filled with contrast.  Retroperitoneum: No  mass or adenopathy.  Peritoneum: No free fluid or gas.  Reproductive: Hysterectomy.  Vascular: Patent aortobifemoral bypass.  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  OSSEOUS: No acute abnormalities. No suspicious lytic or blastic lesions.  IMPRESSION: 1.   No evidence of acute intra-abdominal disease. 2.  Colonic diverticulosis.   Original Report Authenticated By: Tiburcio Pea   Dg Abd 2 Views  10/07/2012   *RADIOLOGY REPORT*  Clinical Data: Abdominal pain, chronic constipation, history of small bowel obstruction  ABDOMEN - 2 VIEW  Comparison: 02/12/2012  Findings: Nonobstructive bowel gas pattern.  Moderate to large colonic stool burden, suggesting constipation.  No evidence of free air under the diaphragm on the upright view.  Cholecystectomy clips.  IMPRESSION: No evidence of small bowel obstruction or free air.  Moderate to large colonic stool burden, suggesting constipation.   Original Report Authenticated By: Charline Bills, M.D.   Medications: I have reviewed the patient's current medications. Scheduled Meds: . amLODipine  10 mg Oral Daily  . aspirin EC  81 mg Oral Daily  . benazepril  40 mg Oral QHS  . efavirenz-emtricitabine-tenofovir  1 tablet Oral QHS  . enoxaparin (LOVENOX) injection  40 mg Subcutaneous Q24H  . hydrochlorothiazide  25 mg Oral Daily  . simvastatin  20 mg Oral q1800  . Travoprost (BAK Free)  1 drop Both Eyes QHS   Continuous Infusions: . sodium chloride 100 mL/hr at 10/07/12 2107   PRN Meds:.acetaminophen, acetaminophen, ALPRAZolam, cyclobenzaprine, ibuprofen, sorbitol, traMADol  Assessment/Plan: This is a 72yo AA woman with h/o HIV (last CD4 1320 09/2012), HTN, PVD, chronic constipation, diverticulosis, DDD, two prior SBOs w/ multiple abd surgeries who presents to clinic with c/o abdominal pain and constipation, CT scan and abd xray negative for diverticulitis and SBO.  # Abdominal Pain: CT scan shows L side diverticulosis without signs of diverticulitis. As stated previously, abdominal xray performed yesterday did not display signs of SBO. Patient's abdominal exam today is less concerning for acute abdomen. Her xray showed large amount of stool and she reports last BM was 6 days ago, so this likely represents  abdominal pain 2/2 constipation, which is likely being exacerbated by recent increase in narcotic usage. - UA + for bacteria but will not treat given patient remains asymptomatic - sorbitol 70% 1tbsp qAM- can increase up to 5 tbsp as needed at home - soap suds enema - supplement K - IVF NS @ 100cc/hr  -ibuprofen and tramadol 100mg  daily prn pain- avoiding narcotics since this can worsen constipation   # HTN: BP 146/73 on admission. Will continue home regimen.  -amlodipine 10mg  daily, benazepril 40mg  daily, HCTZ 25mg  daily   # HIV: Last CD4 count 1320 09/2012. Continue home medications.  -atripla   # Hyperlipidemia: Takes lovastatin 40mg  daily at home- ordered simvastatin 20mg  daily per formulary   # VTE: lovenox   # Diet: heart  Code status: Full  Dispo: Discharge today as long as she has a bowel movement.  The patient does have a current PCP Rocco Serene, MD) and does not need an [facility] hospital follow-up appointment after discharge.  The patient does not have transportation limitations that hinder transportation to clinic appointments.  .Services Needed at time of discharge: Y = Yes, Blank = No PT:   OT:   RN:   Equipment:   Other:     LOS: 1 day   Windell Hummingbird, MD 10/08/2012, 11:16 AM

## 2012-10-08 NOTE — Telephone Encounter (Signed)
Rx called in 

## 2012-10-08 NOTE — H&P (Signed)
I saw and evaluated the patient. I reviewed the resident's note and confirmed the resident's findings.  I agree with the assessment and plan as documented in the resident's note.  Jessica [name] is admitted from clinic with LLQ and likely has constipation as the cause of the pain.  We will try to clear some of her stool with an enema today and assess her LLQ pain subsequently.  If her pain markedly improves with the bowel movement I suspect this was the diagnosis causing her pain.  For long term control of her constipation we have educated her on appropriately managing her narcotics, continue the senna and docusate, and initiate sorbitol 1-5 tablespoons in her morning coffee each morning, titrating to the desired number of bowel movements.  If she has a bowel movement today and feels improved thereafter I agree with discharge home today.

## 2012-10-09 ENCOUNTER — Telehealth: Payer: Self-pay | Admitting: *Deleted

## 2012-10-09 NOTE — Telephone Encounter (Signed)
I am unable to provide such a letter.  She was admitted for constipation and her bowel regimen was modified.  Patient was functional on discharge and did not need a care giver to remain with her in my medical opinion.

## 2012-10-09 NOTE — Telephone Encounter (Signed)
Pt's daughter/ caretaker calls and request a note for school to excuse her through Friday 8/1 so she can stay w/ pt until then, please advise Ph# [phone]. She would like to pick it up from here

## 2012-10-10 ENCOUNTER — Ambulatory Visit: Payer: Medicare Other | Admitting: Infectious Disease

## 2012-10-10 NOTE — Telephone Encounter (Signed)
Pt's daughter informed, she is accepting of answer

## 2012-10-15 NOTE — Progress Notes (Signed)
I saw and evaluated the patient.  I personally confirmed the key portions of the history and exam documented by Dr. Komanski and I reviewed pertinent patient test results.  The assessment, diagnosis, and plan were formulated together and I agree with the documentation in the resident's note.  

## 2012-10-16 ENCOUNTER — Ambulatory Visit (INDEPENDENT_AMBULATORY_CARE_PROVIDER_SITE_OTHER): Payer: Medicare Other | Admitting: Internal Medicine

## 2012-10-16 ENCOUNTER — Ambulatory Visit (HOSPITAL_COMMUNITY)
Admission: RE | Admit: 2012-10-16 | Discharge: 2012-10-16 | Disposition: A | Discharge status: Home / Self Care | Payer: Medicare Other | Source: Ambulatory Visit | Attending: Internal Medicine | Admitting: Internal Medicine

## 2012-10-16 ENCOUNTER — Encounter: Payer: Self-pay | Admitting: Internal Medicine

## 2012-10-16 DIAGNOSIS — I10 Essential (primary) hypertension: Secondary | ICD-10-CM | POA: Diagnosis not present

## 2012-10-16 DIAGNOSIS — R079 Chest pain, unspecified: Secondary | ICD-10-CM

## 2012-10-16 DIAGNOSIS — R9431 Abnormal electrocardiogram [ECG] [EKG]: Secondary | ICD-10-CM | POA: Diagnosis not present

## 2012-10-16 DIAGNOSIS — K5900 Constipation, unspecified: Secondary | ICD-10-CM

## 2012-10-16 DIAGNOSIS — M5137 Other intervertebral disc degeneration, lumbosacral region: Secondary | ICD-10-CM | POA: Diagnosis not present

## 2012-10-16 DIAGNOSIS — R109 Unspecified abdominal pain: Secondary | ICD-10-CM | POA: Diagnosis not present

## 2012-10-16 DIAGNOSIS — I252 Old myocardial infarction: Secondary | ICD-10-CM | POA: Insufficient documentation

## 2012-10-16 DIAGNOSIS — M5136 Other intervertebral disc degeneration, lumbar region: Secondary | ICD-10-CM

## 2012-10-16 DIAGNOSIS — Z79899 Other long term (current) drug therapy: Secondary | ICD-10-CM | POA: Diagnosis not present

## 2012-10-16 DIAGNOSIS — Z5189 Encounter for other specified aftercare: Secondary | ICD-10-CM | POA: Diagnosis not present

## 2012-10-16 LAB — TROPONIN I: Troponin I: <0.3 ng/mL (ref <0.30)

## 2012-10-16 NOTE — Patient Instructions (Addendum)
Please return to clinic in 3-4 months.  1. Please let us know if you continue to have chest pain; your EKG, troponin, and previous echocardiogram was not indicative of cardiac related chest pain 2. Continue to take your medications as prescribed    Chest Pain (Nonspecific) Chest pain has many causes. Your pain could be caused by something serious, such as a heart attack or a blood clot in the lungs. It could also be caused by something less serious, such as a chest bruise or a virus. Follow up with your doctor. More lab tests or other studies may be needed to find the cause of your pain. Most of the time, nonspecific chest pain will improve within 2 to 3 days of rest and mild pain medicine. HOME CARE  For chest bruises, you may put ice on the sore area for 15-20 minutes, 3-4 times a day. Do this only if it makes you or your child feel better.  Put ice in a plastic bag.  Place a towel between the skin and the bag.  Rest for the next 2 to 3 days.  Go back to work if the pain improves.  See your doctor if the pain lasts longer than 1 to 2 weeks.  Only take medicine as told by your doctor.  Quit smoking if you smoke. GET HELP RIGHT AWAY IF:   There is more pain or pain that spreads to the arm, neck, jaw, back, or belly (abdomen).  You or your child has shortness of breath.  You or your child coughs more than usual or coughs up blood.  You or your child has very bad back or belly pain, feels sick to his or her stomach (nauseous), or throws up (vomits).  You or your child has very bad weakness.  You or your child passes out (faints).  You or your child has a temperature by mouth above 102 F (38.9 C), not controlled by medicine. Any of these problems may be serious and may be an emergency. Do not wait to see if the problems will go away. Get medical help right away. Call your local emergency services 911 in [place] Do not drive yourself to the hospital. MAKE SURE YOU:    Understand these instructions.  Will watch this condition.  Will get help right away if you or your child is not doing well or gets worse. Document Released: 08/16/2007 Document Revised: 05/22/2011 Document Reviewed: 08/16/2007 [facility] Patient Information 2014 [place], [place].

## 2012-10-16 NOTE — Progress Notes (Signed)
Patient ID: Jessica [name], female   DOB: [birth year], 72 y.o.   MRN: [id number]           HPI: Jessica [name] is a 72 y.o. AAF with a PMH of SBO, HIV (09/26/12 Viral Load: <20 copies; CD4: 1320), HTN, chronic lower back pain, and hyperlipidemia.  She is here for a follow-up of a recent hospital discharge on 10/08/2012 for abdominal pain presumed secondary to constipation from chronic opioid use.  She was discharged on sorbitol for constipation and tramadol with a fentanyl patch for chronic lower back pain.  Her chronic lower back pain she states is from DDD that has not changed for many years.  She reports having a BM every day since was discharged and her abdominal pain is 70% better.  She reports having lower back pain and would like to be back on vicodin because the tramadol and fentanyl patch is not relieving her pain as much as vicodin.  She states it is worse in the winter.  However upon questioning, she is able to function on the tramadol and fentanyl combination and is just concerned that her pain will increase in the winter and this may not be enough to control it.  She has never been to a pain clinic before.  A previous MRI of the Lumbar Spine in 08/2006 showed mild degenerative disc disease at L5-S1, but no herniation or neural compression.    Additionally, she states she has been having some non-radiating chest pain below the left breast since Thursday.  She describes the pain as sharp, non-pleuritic, well-localized, non-exertional, comes and goes, and lasts for a few seconds.  She denies any associated symptoms including SOB, weakness, diaphoresis, or N/V.  She has never had a similar pain.      Past Medical History  Diagnosis Date  . Glaucoma     left  . Essential hypertension   . Hyperlipidemia LDL goal < 100   . SBO (small bowel obstruction)     In 12/2010, required small bowel resection with  anastamosis - required 2/2 SBO due to adhesions  //  02/2012 - partial  SBO 2/2 adhesions s/p exploratory laparotomy and adhesionolysis  . Peripheral vascular disease     s/p aortofemoral artery bypass  . 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 Chronic lower back pain     lumbar degnerative disc disease  . Tobacco abuse   . Diverticulosis     significant left-sided transverse diverticula as per colonoscopy 05/2010 by Dr. Eula Listen  . Hyperplastic colonic polyp 05/12/2010    Single polyp excised via colonoscopy  . Anxiety   . Blood transfusion without reported diagnosis   . Cataract     Right eye  . Heart murmur 1961  . Osteoporosis     DEXA 04/15/2012: L1-L4 T -3.9, R Femur T - 3.0  . Genital herpes   . Seborrhea   . Constipation due to pain medication   . Voiding dysfunction     s/p cystoscopy and meatal dilation Dec 2005   Current Outpatient Prescriptions  Medication Sig Dispense Refill  . acyclovir (ZOVIRAX) 400 MG tablet Take 400 mg by mouth 3 (three) times daily. As needed for herpes outbreak.  Uses for 7 days.      Marland Kitchen alendronate (FOSAMAX) 70 MG tablet Take 70 mg by mouth every 7 (seven) days. Take with a full glass of water on an empty stomach.      . ALPRAZolam (XANAX) 1 MG tablet Take 1  tablet (1 mg total) by mouth 3 (three) times daily as needed for anxiety.  90 tablet  0  . amLODipine (NORVASC) 10 MG tablet Take 1 tablet (10 mg total) by mouth daily.  90 tablet  3  . aspirin 81 MG EC tablet Take 81 mg by mouth daily.        . benazepril (LOTENSIN) 40 MG tablet Take 40 mg by mouth at bedtime.      . calcium-vitamin D (OSCAL WITH D) 500-200 MG-UNIT per tablet Take 1 tablet by mouth 2 (two) times daily.      . cyclobenzaprine (FLEXERIL) 10 MG tablet Take 1 tablet (10 mg total) by mouth 3 (three) times daily as needed for muscle spasms.  90 tablet  11  . docusate sodium (COLACE) 100 MG capsule Take 100 mg by mouth 2 (two) times daily.      Marland Kitchen efavirenz-emtricitabine-tenofovir (ATRIPLA) 600-200-300 MG per tablet Take 1 tablet by mouth at bedtime.  30 tablet  prn   . ENSURE (ENSURE) Take 237 mLs by mouth 2 (two) times daily.  237 mL  11  . fentaNYL (DURAGESIC) 50 MCG/HR Place 1 patch (50 mcg total) onto the skin every 3 (three) days.  10 patch  0  . hydrochlorothiazide (HYDRODIURIL) 25 MG tablet Take 1 tablet (25 mg total) by mouth daily.  90 tablet  3  . HYDROcodone-acetaminophen (NORCO) 10-325 MG per tablet Take 1 tablet by mouth 3 (three) times daily as needed for pain.      Marland Kitchen ibuprofen (ADVIL,MOTRIN) 200 MG tablet Take 400 mg by mouth 3 (three) times daily as needed for pain.      Marland Kitchen lovastatin (MEVACOR) 40 MG tablet Take 1 tablet (40 mg total) by mouth at bedtime.  30 tablet  5  . megestrol (MEGACE) 40 MG tablet Take 2 tablets (80 mg total) by mouth 2 (two) times daily.  120 tablet  3  . Multiple Vitamin (MULTIVITAMIN WITH MINERALS) TABS Take 1 tablet by mouth daily.      . sorbitol 70 % SOLN Take 15 mLs by mouth daily as needed (mix with coffee, juice, water).  500 mL  1  . traMADol (ULTRAM) 50 MG tablet Take 2 tablets (100 mg total) by mouth every 6 (six) hours as needed.  90 tablet  1  . travoprost, benzalkonium, (TRAVATAN) 0.004 % ophthalmic solution Place 1 drop into the left eye at bedtime.      . triamcinolone ointment (KENALOG) 0.1 % Apply 1 application topically 2 (two) times daily.  454 g  3   No current facility-administered medications for this visit.   Family History  Problem Relation Age of Onset  . Kidney failure Mother   . Diabetes Mother   . Hypertension Mother   . Heart disease Mother   . Glaucoma Father   . Congestive Heart Failure Sister   . Diabetes Sister   . Kidney disease Sister   . Diabetes Brother   . Unexplained death Brother 80    Automobile accident  . Hypothyroidism Daughter   . Arthritis Daughter     Neck/Back  . Healthy Son   . HIV/AIDS Brother   . HIV Daughter   . Kidney disease Daughter   . Arthritis Son     Knee   History   Social History  . Marital Status: Widowed    Spouse Name: N/A    Number  of Children: 4  . Years of Education: 2y college   Occupational  History  . 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    Social History Main Topics  . Smoking status: Current Every Day Smoker -- 0.50 packs/day for 50 years    Types: Cigarettes  . Smokeless tobacco: Never Used  . Alcohol Use: No     Comment: "last drink of alcohol ~ 1977"  . Drug Use: No  . Sexually Active: No   Other Topics Concern  . None   Social History Narrative   Lives in [place], [place] with daughter and nephew    Review of Systems: Constitutional: Denies fever, chills, diaphoresis, appetite change and fatigue.  Respiratory: Denies SOB, DOE, cough, chest tightness, and wheezing.  Cardiovascular: No chest pain, palpitations and leg swelling.  Gastrointestinal: No abdominal pain, nausea, vomiting, bloody stools Genitourinary: No dysuria, frequency, hematuria, or flank pain.  Musculoskeletal: No myalgias, back pain, joint swelling, arthralgias    Objective:  Physical Exam: Filed Vitals:   10/16/12 1359  BP: 126/70  Pulse: 76  Temp: 98 F (36.7 C)  TempSrc: Oral  Resp: 20  Height: 5\' 6"  (1.676 m)  Weight: 126 lb 4.8 oz (57.289 kg)  SpO2: 98%   General: Well nourished. No acute distress.  Lungs: CTA bilaterally. Heart: RRR; no extra sounds or murmurs  Abdomen: Non-distended, normal BS, soft, nontender; no hepatosplenomegaly  Extremities: No pedal edema. No joint swelling or tenderness. Neurologic: Alert and oriented x3. No obvious neurologic deficits. Straight leg raise negative.  Normal strength in all muscle groups.   Assessment & Plan:  I have discussed my assessment and plan with Dr. Meredith Pel  as detailed under problem based charting.

## 2012-10-16 NOTE — Assessment & Plan Note (Signed)
Pt states her chronic lower back pain she is from DDD that she states has not changed for many years.  She reports having lower back pain and would like to be back on vicodin because the tramadol and fentanyl patch are not relieving her pain as much as vicodin.  She states it is worse in the winter.  However upon questioning, she is able to function on the tramadol and fentanyl combination and is just concerned that her pain will increase in the winter and this may not be enough to control it.  She has never been to a pain clinic before.  A previous MRI of the Lumbar Spine in 08/2006 showed mild degenerative disc disease at L5-S1, but no herniation or neural compression.  She is insistent upon taking vicodin for pain and may need a referral to a pain clinic.

## 2012-10-16 NOTE — Assessment & Plan Note (Addendum)
Pt states constipation has much improved since her discharge on 10/08/12.  She was discharged on sorbitol and reports having BMs everyday.  She is not currently taking vicodin due to the constipation.  She is taking 2 tabs of tramadol and a fentanyl patch which seems to be relieving her pain for now.  She is adamant about continuing vicodin and is worried that her pain will increase in the winter.  I cautioned her that continuing vicodin is not a good choice and that she does not want to have the same outcome that landed her in the hospital recently.  She may need a referral to a pain clinic.

## 2012-10-16 NOTE — Assessment & Plan Note (Addendum)
Pt has been having some non-radiating chest pain below the left breast since Thursday.  She describes the pain as sharp, non-pleuritic, well-localized, non-exertional, comes and goes, and lasts for a few seconds.  She denies any associated symptoms including SOB, weakness, diaphoresis, or N/V.  She has never had a similar pain.  We did an EKG which showed no new changes from a previous EKG in 01/2012.  We also did a stat-troponin which was negative.  Additionally, she had a stress echocardiogram in 2013 which revealed a normal ejection fraction at rest and with stress. There was no echocardiographic evidence for stress-induced ischemia.  We instructed her to return if the pain does not subside.

## 2012-10-16 NOTE — Assessment & Plan Note (Signed)
BP Readings from Last 3 Encounters:  10/16/12 126/70  10/08/12 138/69  10/07/12 146/73    Lab Results  Component Value Date   NA 136 10/08/2012   K 3.4* 10/08/2012   CREATININE 0.46* 10/08/2012    Assessment: Blood pressure control: at goal  Progress toward BP goal:  at goal  Plan: Medications:  continue current medications

## 2012-10-17 ENCOUNTER — Other Ambulatory Visit: Payer: Self-pay | Admitting: *Deleted

## 2012-10-17 DIAGNOSIS — F419 Anxiety disorder, unspecified: Secondary | ICD-10-CM

## 2012-10-17 DIAGNOSIS — M5136 Other intervertebral disc degeneration, lumbar region: Secondary | ICD-10-CM

## 2012-10-17 MED ORDER — ALPRAZOLAM 1 MG PO TABS: 1.0000 mg | ORAL_TABLET | Freq: Three times a day (TID) | ORAL | Status: DC | PRN

## 2012-10-17 MED ORDER — HYDROCODONE-ACETAMINOPHEN 10-325 MG PO TABS: 1.0000 | ORAL_TABLET | Freq: Three times a day (TID) | ORAL | Status: DC | PRN

## 2012-10-17 MED ORDER — FENTANYL 50 MCG/HR TD PT72: 1.0000 | MEDICATED_PATCH | TRANSDERMAL | Status: DC

## 2012-10-17 NOTE — Progress Notes (Signed)
I saw and evaluated the patient.  I personally confirmed the key portions of the history and exam documented by Dr. Delane Ginger and I reviewed pertinent patient test results.  The assessment, diagnosis, and plan were formulated together and I agree with the documentation in the resident's note, with the following additional comments.  Patient's chronic low back pain is stable per patient, and is currently well controlled on fentanyl patch and tramadol.  We therefore advised her not to resume the hydrocodone/acetaminophen.  If her transient epidodes of chest pain persist, further workup can be considered.  We advised her to come in immediately if she has any increase in her symptoms.

## 2012-11-05 ENCOUNTER — Ambulatory Visit: Payer: Medicare Other | Admitting: Cardiovascular Disease

## 2012-11-08 ENCOUNTER — Encounter (INDEPENDENT_AMBULATORY_CARE_PROVIDER_SITE_OTHER): Payer: Medicare Other | Admitting: Cardiology

## 2012-11-08 DIAGNOSIS — I739 Peripheral vascular disease, unspecified: Secondary | ICD-10-CM

## 2012-11-08 DIAGNOSIS — I7389 Other specified peripheral vascular diseases: Secondary | ICD-10-CM

## 2012-11-12 ENCOUNTER — Ambulatory Visit (INDEPENDENT_AMBULATORY_CARE_PROVIDER_SITE_OTHER): Payer: Medicare Other | Admitting: Cardiovascular Disease

## 2012-11-12 ENCOUNTER — Encounter: Payer: Self-pay | Admitting: Cardiovascular Disease

## 2012-11-12 VITALS — BP 112/74 | HR 88 | Ht 68.0 in | Wt 124.0 lb

## 2012-11-12 DIAGNOSIS — I739 Peripheral vascular disease, unspecified: Secondary | ICD-10-CM

## 2012-11-12 DIAGNOSIS — F17200 Nicotine dependence, unspecified, uncomplicated: Secondary | ICD-10-CM | POA: Diagnosis not present

## 2012-11-12 DIAGNOSIS — Z720 Tobacco use: Secondary | ICD-10-CM

## 2012-11-12 NOTE — Progress Notes (Signed)
HPI  This is a 72 year old African American female is here today for a follow up visit regarding  peripheral vascular disease. She is status post aortobifemoral bypass in 2009 done by Dr. Hart Rochester. She has done well and was discharged from his clinic in 2011. She had noninvasive Doppler evaluation done in July, 2013 which showed normal ABI bilaterally. Aortoiliac duplex showed patent bypass without significant stenosis. There was a greater than 50% stenosis in the right common femoral artery distal to the anastomosis. She was completely asymptomatic and thus elected to monitor her. She had a recent ABI which was normal. She still denies any claudication. She has numbness in left foot.  She continues to smoke with failed attempts of quitting. Other medical issues include asymptomatic moderate right carotid stenosis, HIV infection , HTN and hyperlipidemia.     No Known Allergies   Current Outpatient Prescriptions on File Prior to Visit  Medication Sig Dispense Refill  . acyclovir (ZOVIRAX) 400 MG tablet Take 400 mg by mouth 3 (three) times daily. As needed for herpes outbreak.  Uses for 7 days.      Marland Kitchen alendronate (FOSAMAX) 70 MG tablet Take 70 mg by mouth every 7 (seven) days. Take with a full glass of water on an empty stomach.      . ALPRAZolam (XANAX) 1 MG tablet Take 1 tablet (1 mg total) by mouth 3 (three) times daily as needed for anxiety.  90 tablet  0  . amLODipine (NORVASC) 10 MG tablet Take 1 tablet (10 mg total) by mouth daily.  90 tablet  3  . aspirin 81 MG EC tablet Take 81 mg by mouth daily.        . benazepril (LOTENSIN) 40 MG tablet Take 40 mg by mouth at bedtime.      . calcium-vitamin D (OSCAL WITH D) 500-200 MG-UNIT per tablet Take 1 tablet by mouth 2 (two) times daily.      . cyclobenzaprine (FLEXERIL) 10 MG tablet Take 1 tablet (10 mg total) by mouth 3 (three) times daily as needed for muscle spasms.  90 tablet  11  . docusate sodium (COLACE) 100 MG capsule Take 100 mg by  mouth 2 (two) times daily.      Marland Kitchen efavirenz-emtricitabine-tenofovir (ATRIPLA) 600-200-300 MG per tablet Take 1 tablet by mouth at bedtime.  30 tablet  prn  . ENSURE (ENSURE) Take 237 mLs by mouth 2 (two) times daily.  237 mL  11  . fentaNYL (DURAGESIC) 50 MCG/HR Place 1 patch (50 mcg total) onto the skin every 3 (three) days.  10 patch  0  . hydrochlorothiazide (HYDRODIURIL) 25 MG tablet Take 1 tablet (25 mg total) by mouth daily.  90 tablet  3  . HYDROcodone-acetaminophen (NORCO) 10-325 MG per tablet Take 1 tablet by mouth 3 (three) times daily as needed for pain.  60 tablet  0  . ibuprofen (ADVIL,MOTRIN) 200 MG tablet Take 400 mg by mouth 3 (three) times daily as needed for pain.      Marland Kitchen lovastatin (MEVACOR) 40 MG tablet Take 1 tablet (40 mg total) by mouth at bedtime.  30 tablet  5  . megestrol (MEGACE) 40 MG tablet Take 2 tablets (80 mg total) by mouth 2 (two) times daily.  120 tablet  3  . Multiple Vitamin (MULTIVITAMIN WITH MINERALS) TABS Take 1 tablet by mouth daily.      . sorbitol 70 % SOLN Take 15 mLs by mouth daily as needed (mix with coffee, juice,  water).  500 mL  1  . traMADol (ULTRAM) 50 MG tablet Take 2 tablets (100 mg total) by mouth every 6 (six) hours as needed.  90 tablet  1  . travoprost, benzalkonium, (TRAVATAN) 0.004 % ophthalmic solution Place 1 drop into the left eye at bedtime.      . triamcinolone ointment (KENALOG) 0.1 % Apply 1 application topically 2 (two) times daily.  454 g  3   No current facility-administered medications on file prior to visit.     Past Medical History  Diagnosis Date  . Glaucoma     left  . Essential hypertension   . Hyperlipidemia LDL goal < 100   . SBO (small bowel obstruction)     In 12/2010, required small bowel resection with  anastamosis - required 2/2 SBO due to adhesions  //  02/2012 - partial SBO 2/2 adhesions s/p exploratory laparotomy and adhesionolysis  . Peripheral vascular disease     s/p aortofemoral artery bypass  . HIV  disease 03/27/1986  . Chronic lower back pain     lumbar degnerative disc disease  . Tobacco abuse   . Diverticulosis     significant left-sided transverse diverticula as per colonoscopy 05/2010 by Dr. Eula Listen  . Hyperplastic colonic polyp 05/12/2010    Single polyp excised via colonoscopy  . Anxiety   . Blood transfusion without reported diagnosis   . Cataract     Right eye  . Heart murmur 1961  . Osteoporosis     DEXA 04/15/2012: L1-L4 T -3.9, R Femur T - 3.0  . Genital herpes   . Seborrhea   . Constipation due to pain medication   . Voiding dysfunction     s/p cystoscopy and meatal dilation Dec 2005     Past Surgical History  Procedure Laterality Date  . Eye surgery    . Aorto-femoral bypass graft  04/2007  . Appendectomy    . Cholecystectomy    . Colectomy  01/2011    Dr. Jamey Ripa; "took out 12 inches of small intestiines and removed blockage"  . Abdominal hysterectomy    . Laparotomy  02/12/2012    Procedure: EXPLORATORY LAPAROTOMY;  Surgeon: Almond Lint, MD;  Location: [facility] OR;  Service: General;  Laterality: N/A;  Exploratory Laparotomy, lysis of adhesions  . Breast surgery      Breast biopsy: negative  . Small intestine surgery       Family History  Problem Relation Age of Onset  . Kidney failure Mother   . Diabetes Mother   . Hypertension Mother   . Heart disease Mother   . Glaucoma Father   . Congestive Heart Failure Sister   . Diabetes Sister   . Kidney disease Sister   . Diabetes Brother   . Unexplained death Brother 38    Automobile accident  . Hypothyroidism Daughter   . Arthritis Daughter     Neck/Back  . Healthy Son   . HIV/AIDS Brother   . HIV Daughter   . Kidney disease Daughter   . Arthritis Son     Knee     History   Social History  . Marital Status: Widowed    Spouse Name: N/A    Number of Children: 4  . Years of Education: 2y college   Occupational History  . 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     Social History Main Topics  . Smoking status: Current Every Day Smoker --  0.50 packs/day for 50 years    Types: Cigarettes  . Smokeless tobacco: Never Used  . Alcohol Use: No     Comment: "last drink of alcohol ~ 1977"  . Drug Use: No  . Sexual Activity: No   Other Topics Concern  . Not on file   Social History Narrative   Lives in [place], [place] with daughter and nephew      PHYSICAL EXAM   BP 112/74  Pulse 88  Ht 5\' 8"  (1.727 m)  Wt 124 lb (56.246 kg)  BMI 18.86 kg/m2  SpO2 98%  Constitutional: She is oriented to person, place, and time. She appears well-developed and well-nourished. No distress.  HENT: No nasal discharge.  Head: Normocephalic and atraumatic.  Eyes: Pupils are equal and round. Right eye exhibits no discharge. Left eye exhibits no discharge.  Neck: Normal range of motion. Neck supple. No JVD present. No thyromegaly present.  Cardiovascular: Normal rate, regular rhythm, normal heart sounds. Exam reveals no gallop and no friction rub. No murmur heard.  Pulmonary/Chest: Effort normal and breath sounds normal. No stridor. No respiratory distress. She has no wheezes. She has no rales. She exhibits no tenderness.  Abdominal: Soft. Bowel sounds are normal. She exhibits no distension. There is no tenderness. There is no rebound and no guarding.  Musculoskeletal: Normal range of motion. She exhibits no edema and no tenderness.  Neurological: She is alert and oriented to person, place, and time. Coordination normal.  Skin: Skin is warm and dry. No rash noted. She is not diaphoretic. No erythema. No pallor.  Psychiatric: She has a normal mood and affect. Her behavior is normal. Judgment and thought content normal.  Vascular: Femoral pulse is normal bilaterally with a bruit in the right side only. PT/DP is palpable bilaterally.    ASSESSMENT AND PLAN

## 2012-11-12 NOTE — Assessment & Plan Note (Signed)
Still with no claudication and normal pulses. Recent ABI was also normal.  Continue medical therapy and observation.  Follow up in 1 year.

## 2012-11-12 NOTE — Assessment & Plan Note (Signed)
I again had a prolonged discussion about importance of smoking cessation.

## 2012-11-12 NOTE — Patient Instructions (Addendum)
Your physician has requested that you have an ankle brachial index (ABI) in 1 YEAR. During this test an ultrasound and blood pressure cuff are used to evaluate the arteries that supply the arms and legs with blood. Allow thirty minutes for this exam. There are no restrictions or special instructions.  Your physician wants you to follow-up in: 1 YEAR with Dr Kirke Corin. You will receive a reminder letter in the mail two months in advance. If you don't receive a letter, please call our office to schedule the follow-up appointment.  Your physician recommends that you continue on your current medications as directed. Please refer to the Current Medication list given to you today.

## 2012-11-14 ENCOUNTER — Other Ambulatory Visit: Payer: Self-pay | Admitting: *Deleted

## 2012-11-14 ENCOUNTER — Encounter: Payer: Self-pay | Admitting: Infectious Disease

## 2012-11-14 ENCOUNTER — Ambulatory Visit: Payer: Medicare Other | Admitting: Infectious Disease

## 2012-11-14 VITALS — BP 176/81 | HR 84 | Temp 98.8°F | Ht 67.5 in | Wt 128.5 lb

## 2012-11-14 DIAGNOSIS — I10 Essential (primary) hypertension: Secondary | ICD-10-CM

## 2012-11-14 DIAGNOSIS — Z113 Encounter for screening for infections with a predominantly sexual mode of transmission: Secondary | ICD-10-CM

## 2012-11-14 DIAGNOSIS — Z720 Tobacco use: Secondary | ICD-10-CM

## 2012-11-14 DIAGNOSIS — B20 Human immunodeficiency virus [HIV] disease: Secondary | ICD-10-CM

## 2012-11-14 DIAGNOSIS — F419 Anxiety disorder, unspecified: Secondary | ICD-10-CM

## 2012-11-14 DIAGNOSIS — M5136 Other intervertebral disc degeneration, lumbar region: Secondary | ICD-10-CM

## 2012-11-14 DIAGNOSIS — E785 Hyperlipidemia, unspecified: Secondary | ICD-10-CM

## 2012-11-14 DIAGNOSIS — M51369 Other intervertebral disc degeneration, lumbar region without mention of lumbar back pain or lower extremity pain: Secondary | ICD-10-CM

## 2012-11-14 DIAGNOSIS — Z23 Encounter for immunization: Secondary | ICD-10-CM

## 2012-11-14 MED ORDER — VARENICLINE TARTRATE 0.5 MG X 11 & 1 MG X 42 PO MISC: ORAL | Status: DC

## 2012-11-14 MED ORDER — ALPRAZOLAM 1 MG PO TABS: 1.0000 mg | ORAL_TABLET | Freq: Three times a day (TID) | ORAL | Status: DC | PRN

## 2012-11-14 MED ORDER — VARENICLINE TARTRATE 1 MG PO TABS: 1.0000 mg | ORAL_TABLET | Freq: Two times a day (BID) | ORAL | Status: DC

## 2012-11-14 MED ORDER — HYDROCODONE-ACETAMINOPHEN 10-325 MG PO TABS: 1.0000 | ORAL_TABLET | Freq: Three times a day (TID) | ORAL | Status: DC | PRN

## 2012-11-14 MED ORDER — FENTANYL 50 MCG/HR TD PT72: 1.0000 | MEDICATED_PATCH | TRANSDERMAL | Status: DC

## 2012-11-14 NOTE — Progress Notes (Signed)
  Subjective:    Patient ID: Jessica [name], female    DOB: [birth year], 72 y.o.   MRN: [id number]  HPI  [name] is a 72 y.o. female with HIV infection who is doing superbly well on her antiviral regimen, Atripla with undetectable viral load and health cd4 count.   She does have PVD and is seen by Cardiology, [facility] having undergon bypass surgery and seen by Dr Josem Kaufmann in [facility] clinic  She has co of papular lesions on her feet one of which she drained with heated blade.   I again decides need for smoking cessation and she was willing to be prescribed Chantix.    Review of Systems  Constitutional: Negative for fever, chills, diaphoresis, activity change, appetite change, fatigue and unexpected weight change.  HENT: Negative for congestion, sore throat, rhinorrhea, sneezing, trouble swallowing and sinus pressure.   Eyes: Negative for photophobia and visual disturbance.  Respiratory: Negative for cough, chest tightness, shortness of breath, wheezing and stridor.   Cardiovascular: Negative for chest pain, palpitations and leg swelling.  Gastrointestinal: Negative for nausea, vomiting, abdominal pain, diarrhea, constipation, blood in stool, abdominal distention and anal bleeding.  Genitourinary: Negative for dysuria, hematuria, flank pain and difficulty urinating.  Musculoskeletal: Negative for myalgias, back pain, joint swelling, arthralgias and gait problem.  Skin: Positive for rash. Negative for color change, pallor and wound.  Neurological: Negative for dizziness, tremors, weakness and light-headedness.  Hematological: Negative for adenopathy. Does not bruise/bleed easily.  Psychiatric/Behavioral: Negative for behavioral problems, confusion, sleep disturbance, dysphoric mood, decreased concentration and agitation.       Objective:   Physical Exam  Constitutional: She is oriented to person, place, and time. She appears well-developed and well-nourished. No distress.    HENT:  Head: Normocephalic and atraumatic.  Mouth/Throat: Oropharynx is clear and moist. No oropharyngeal exudate.  Eyes: Conjunctivae and EOM are normal. Pupils are equal, round, and reactive to light. No scleral icterus.  Neck: Normal range of motion. Neck supple. No JVD present.  Cardiovascular: Normal rate, regular rhythm and normal heart sounds.  Exam reveals no gallop and no friction rub.   No murmur heard. Pulmonary/Chest: Effort normal and breath sounds normal. No respiratory distress. She has no wheezes. She has no rales. She exhibits no tenderness.  Abdominal: She exhibits no distension and no mass. There is no tenderness. There is no rebound and no guarding.  Musculoskeletal: She exhibits no edema and no tenderness.  Lymphadenopathy:    She has no cervical adenopathy.  Neurological: She is alert and oriented to person, place, and time. She has normal reflexes. She exhibits normal muscle tone. Coordination normal.  Skin: Skin is warm and dry. Rash noted. She is not diaphoretic. No erythema. No pallor.     Psychiatric: She has a normal mood and affect. Her behavior is normal. Judgment and thought content normal.          Assessment & Plan:   HIV: Perfect control continue current antiretroviral regimen.  Smoking: Prescribe  Chantix.  Peripheral vascular disease cone followed by [facility] cardiology. On statin,   Hyperlipidemia: on statin  HTN: Blood pressure elevated today but was within range at last visit 2 days ago we'll check urine microalbumin to creatinine ratio.  Chronic pain: Refilled her prescriptions for fentanyl patches and Percocet as had been prescribed for more than a decade by Dr. Maurice March.  Anxiety Xanax also refilled.

## 2012-12-05 ENCOUNTER — Other Ambulatory Visit: Payer: Self-pay | Admitting: Infectious Diseases

## 2012-12-06 ENCOUNTER — Other Ambulatory Visit: Payer: Self-pay | Admitting: *Deleted

## 2012-12-06 DIAGNOSIS — M5136 Other intervertebral disc degeneration, lumbar region: Secondary | ICD-10-CM

## 2012-12-06 MED ORDER — TRAMADOL HCL 50 MG PO TABS: 100.0000 mg | ORAL_TABLET | Freq: Four times a day (QID) | ORAL | Status: DC | PRN

## 2012-12-06 NOTE — Progress Notes (Signed)
This encounter was created in error - please disregard.

## 2012-12-13 ENCOUNTER — Ambulatory Visit (INDEPENDENT_AMBULATORY_CARE_PROVIDER_SITE_OTHER): Payer: Medicare Other | Admitting: Internal Medicine

## 2012-12-13 ENCOUNTER — Encounter: Payer: Self-pay | Admitting: Internal Medicine

## 2012-12-13 VITALS — BP 132/80 | HR 100 | Temp 97.9°F | Wt 130.3 lb

## 2012-12-13 DIAGNOSIS — M5137 Other intervertebral disc degeneration, lumbosacral region: Secondary | ICD-10-CM

## 2012-12-13 DIAGNOSIS — B20 Human immunodeficiency virus [HIV] disease: Secondary | ICD-10-CM

## 2012-12-13 DIAGNOSIS — R55 Syncope and collapse: Secondary | ICD-10-CM | POA: Diagnosis not present

## 2012-12-13 DIAGNOSIS — I10 Essential (primary) hypertension: Secondary | ICD-10-CM

## 2012-12-13 DIAGNOSIS — F17200 Nicotine dependence, unspecified, uncomplicated: Secondary | ICD-10-CM

## 2012-12-13 DIAGNOSIS — Z5189 Encounter for other specified aftercare: Secondary | ICD-10-CM | POA: Diagnosis not present

## 2012-12-13 DIAGNOSIS — Z720 Tobacco use: Secondary | ICD-10-CM

## 2012-12-13 DIAGNOSIS — M7551 Bursitis of right shoulder: Secondary | ICD-10-CM

## 2012-12-13 DIAGNOSIS — M67919 Unspecified disorder of synovium and tendon, unspecified shoulder: Secondary | ICD-10-CM | POA: Diagnosis not present

## 2012-12-13 DIAGNOSIS — M5136 Other intervertebral disc degeneration, lumbar region: Secondary | ICD-10-CM

## 2012-12-13 NOTE — Patient Instructions (Signed)
It was great to see you.  You are doing a nice job caring for yourself!  1) Keep taking your medications as you are.  2) Be sure to keep your skin moisturized as you are doing.  3) Please call if you have another event when you are not aware of what was going on for a few seconds.  If this happens, depending on your description of the event, we may send you to neurology for an evaluation.  I will see you back in 6 months, sooner if necessary.
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
## 2012-12-14 DIAGNOSIS — R55 Syncope and collapse: Secondary | ICD-10-CM | POA: Insufficient documentation

## 2012-12-14 NOTE — Assessment & Plan Note (Signed)
Her constipation is much improved with the addition of the sorbitol, which is now effective in managing her symptoms.  Will continue the sorbitol.

## 2012-12-14 NOTE — Assessment & Plan Note (Addendum)
Managed by the [facility] for Infectious Diseases and I will defer continued management to them.

## 2012-12-14 NOTE — Assessment & Plan Note (Signed)
Symptoms have resolved with the steroid injection at the last visit.

## 2012-12-14 NOTE — Assessment & Plan Note (Signed)
Her blood pressure is at target at this time.  Will therefore continue the current regimen.

## 2012-12-14 NOTE — Assessment & Plan Note (Signed)
She is on the chantix and is smoking less.  She will continue this regimen for an additional 1 month in an attempt to completely quit smoking altogether.

## 2012-12-14 NOTE — Assessment & Plan Note (Signed)
Her pain is currently well controlled on the fentanyl 50 mcg patch and narco 10-325 mg.  She was had to use the ibuprofen very infrequently.  Will continue this regimen.

## 2012-12-14 NOTE — Assessment & Plan Note (Signed)
The etiology of the brief episode she experienced is unclear.  I suspect her mind may have "wandered" given the description although something like an absence seizure could also have had a similar presentation, albeit unusual in this age group.  We will continue to monitor for similar episodes and she will call us if one develops.  I asked her to think about the signs and symptoms she was experiencing just before the episode where it to happen again.

## 2012-12-16 ENCOUNTER — Other Ambulatory Visit: Payer: Self-pay

## 2012-12-16 DIAGNOSIS — M5136 Other intervertebral disc degeneration, lumbar region: Secondary | ICD-10-CM

## 2012-12-16 DIAGNOSIS — B20 Human immunodeficiency virus [HIV] disease: Secondary | ICD-10-CM

## 2012-12-16 DIAGNOSIS — B029 Zoster without complications: Secondary | ICD-10-CM

## 2012-12-16 DIAGNOSIS — F419 Anxiety disorder, unspecified: Secondary | ICD-10-CM

## 2012-12-16 MED ORDER — HYDROCODONE-ACETAMINOPHEN 10-325 MG PO TABS: 1.0000 | ORAL_TABLET | Freq: Three times a day (TID) | ORAL | Status: DC | PRN

## 2012-12-16 MED ORDER — FENTANYL 50 MCG/HR TD PT72: 1.0000 | MEDICATED_PATCH | TRANSDERMAL | Status: DC

## 2012-12-16 MED ORDER — ACYCLOVIR 400 MG PO TABS: 400.0000 mg | ORAL_TABLET | Freq: Three times a day (TID) | ORAL | Status: DC

## 2012-12-16 MED ORDER — ALPRAZOLAM 1 MG PO TABS: 1.0000 mg | ORAL_TABLET | Freq: Three times a day (TID) | ORAL | Status: DC | PRN

## 2012-12-16 MED ORDER — EFAVIRENZ-EMTRICITAB-TENOFOVIR 600-200-300 MG PO TABS: 1.0000 | ORAL_TABLET | Freq: Every day | ORAL | Status: DC

## 2012-12-18 ENCOUNTER — Other Ambulatory Visit: Payer: Self-pay | Admitting: *Deleted

## 2012-12-18 DIAGNOSIS — B029 Zoster without complications: Secondary | ICD-10-CM

## 2012-12-18 DIAGNOSIS — B20 Human immunodeficiency virus [HIV] disease: Secondary | ICD-10-CM

## 2012-12-18 MED ORDER — ACYCLOVIR 400 MG PO TABS: 400.0000 mg | ORAL_TABLET | Freq: Three times a day (TID) | ORAL | Status: DC

## 2012-12-18 MED ORDER — EFAVIRENZ-EMTRICITAB-TENOFOVIR 600-200-300 MG PO TABS: 1.0000 | ORAL_TABLET | Freq: Every day | ORAL | Status: DC

## 2012-12-23 DIAGNOSIS — H4010X0 Unspecified open-angle glaucoma, stage unspecified: Secondary | ICD-10-CM | POA: Diagnosis not present

## 2013-01-06 ENCOUNTER — Other Ambulatory Visit: Payer: Self-pay | Admitting: Infectious Diseases

## 2013-01-06 DIAGNOSIS — H4010X0 Unspecified open-angle glaucoma, stage unspecified: Secondary | ICD-10-CM | POA: Diagnosis not present

## 2013-01-14 ENCOUNTER — Other Ambulatory Visit: Payer: Self-pay | Admitting: *Deleted

## 2013-01-14 ENCOUNTER — Telehealth: Payer: Self-pay | Admitting: *Deleted

## 2013-01-14 DIAGNOSIS — F419 Anxiety disorder, unspecified: Secondary | ICD-10-CM

## 2013-01-14 DIAGNOSIS — M5136 Other intervertebral disc degeneration, lumbar region: Secondary | ICD-10-CM

## 2013-01-14 MED ORDER — ALPRAZOLAM 1 MG PO TABS: 1.0000 mg | ORAL_TABLET | Freq: Three times a day (TID) | ORAL | Status: DC | PRN

## 2013-01-14 MED ORDER — FENTANYL 50 MCG/HR TD PT72: 50.0000 ug | MEDICATED_PATCH | TRANSDERMAL | Status: DC

## 2013-01-14 MED ORDER — HYDROCODONE-ACETAMINOPHEN 10-325 MG PO TABS: 1.0000 | ORAL_TABLET | Freq: Three times a day (TID) | ORAL | Status: DC | PRN

## 2013-01-14 NOTE — Telephone Encounter (Signed)
Patient called and will be here to pick up her 3 Rx on Monday 01/20/13 and wanted to make sure they were ready. Advised her they will be ready for pick up then.

## 2013-01-31 ENCOUNTER — Ambulatory Visit (INDEPENDENT_AMBULATORY_CARE_PROVIDER_SITE_OTHER): Payer: Medicare Other | Admitting: Internal Medicine

## 2013-01-31 ENCOUNTER — Encounter: Payer: Self-pay | Admitting: Internal Medicine

## 2013-01-31 VITALS — BP 153/88 | HR 96 | Temp 97.8°F | Wt 141.4 lb

## 2013-01-31 DIAGNOSIS — M5137 Other intervertebral disc degeneration, lumbosacral region: Secondary | ICD-10-CM | POA: Diagnosis not present

## 2013-01-31 DIAGNOSIS — F17200 Nicotine dependence, unspecified, uncomplicated: Secondary | ICD-10-CM | POA: Diagnosis not present

## 2013-01-31 DIAGNOSIS — M5136 Other intervertebral disc degeneration, lumbar region: Secondary | ICD-10-CM

## 2013-01-31 DIAGNOSIS — I10 Essential (primary) hypertension: Secondary | ICD-10-CM | POA: Diagnosis not present

## 2013-01-31 DIAGNOSIS — B20 Human immunodeficiency virus [HIV] disease: Secondary | ICD-10-CM

## 2013-01-31 DIAGNOSIS — H409 Unspecified glaucoma: Secondary | ICD-10-CM

## 2013-01-31 DIAGNOSIS — M67919 Unspecified disorder of synovium and tendon, unspecified shoulder: Secondary | ICD-10-CM | POA: Diagnosis not present

## 2013-01-31 DIAGNOSIS — L989 Disorder of the skin and subcutaneous tissue, unspecified: Secondary | ICD-10-CM | POA: Diagnosis not present

## 2013-01-31 DIAGNOSIS — Z720 Tobacco use: Secondary | ICD-10-CM

## 2013-01-31 DIAGNOSIS — R0982 Postnasal drip: Secondary | ICD-10-CM | POA: Diagnosis not present

## 2013-01-31 DIAGNOSIS — M7551 Bursitis of right shoulder: Secondary | ICD-10-CM

## 2013-01-31 NOTE — Patient Instructions (Signed)
It was good to see you today.  I am sorry you are not feeling well and am also sorry I was unable to inject your right shoulder today.  1) I sent a referral to Sports Medicine on [facility] for a right shoulder injection for your nursitis.  2) I sent a referral for Podiatry to look at your right foot weeping lesion.  3) Try nasal saline spray for your nasal symptoms.  Also start using your humidifier in your room so you do not get any more bloody noses.  4) Keep taking your other medications as you are.  5) I will see you again in 3 months to follow-up your shoulder pain and blood pressure, sooner if necessary.

## 2013-02-01 ENCOUNTER — Encounter: Payer: Self-pay | Admitting: Internal Medicine

## 2013-02-01 DIAGNOSIS — M75101 Unspecified rotator cuff tear or rupture of right shoulder, not specified as traumatic: Secondary | ICD-10-CM | POA: Insufficient documentation

## 2013-02-01 DIAGNOSIS — L989 Disorder of the skin and subcutaneous tissue, unspecified: Secondary | ICD-10-CM | POA: Insufficient documentation

## 2013-02-01 DIAGNOSIS — R0982 Postnasal drip: Secondary | ICD-10-CM | POA: Insufficient documentation

## 2013-02-01 HISTORY — DX: Unspecified rotator cuff tear or rupture of right shoulder, not specified as traumatic: M75.101

## 2013-02-01 NOTE — Assessment & Plan Note (Signed)
Blood pressure elevated today on the amlodipine, benazepril, and HCTZ.  She states she took an over the counter decongestant and this could be the reason for the elevated BP.  At the last visit the blood pressure was very well controlled.  Will reassess at the follow-up visit and if still elevated we will escalate antihypertensive regimen.

## 2013-02-01 NOTE — Assessment & Plan Note (Signed)
Followed closely and managed in the [facility] for Infectious Diseases.  Will defer continued management to them.

## 2013-02-01 NOTE — Assessment & Plan Note (Signed)
Notes nasal congestion, post nasal drip with sore throat, and mild epistaxis likely related to dehydration.  Was advised to try some saline nasal spray and use her humidifier in her room now that we have entered the winter season with drier air.

## 2013-02-01 NOTE — Assessment & Plan Note (Signed)
Ophthalmologist changed travatan to [place] the medication list was updated to reflect this.

## 2013-02-01 NOTE — Assessment & Plan Note (Signed)
She continues to smoke despite the chantix.  She remains motivated to quit and she will continue with the course of chantix as planned.

## 2013-02-01 NOTE — Assessment & Plan Note (Signed)
She has a very shallow ulcer on the dorsum of the right foot between the big and second toes.  It does not appear to be infected.  Will refer to Podiatry for evaluation.

## 2013-02-01 NOTE — Progress Notes (Signed)
  Subjective:    Patient ID: Jessica [name], female    DOB: 07/27/1940, 72 y.o.   MRN: [id number]  HPI  Please see the A&P for the status of the pt's chronic medical problems.  Review of Systems  Constitutional: Negative for fever, activity change and appetite change.  HENT: Positive for congestion, postnasal drip, rhinorrhea and sore throat.   Respiratory: Negative for shortness of breath.   Cardiovascular: Negative for chest pain and leg swelling.  Gastrointestinal: Positive for constipation. Negative for nausea, vomiting and diarrhea.  Musculoskeletal: Positive for arthralgias and back pain. Negative for joint swelling and myalgias.  Skin: Positive for wound.       Planter surface between big and second toe on right  Neurological: Negative for dizziness and syncope.      Objective:   Physical Exam  Nursing note and vitals reviewed. Constitutional: She is oriented to person, place, and time. She appears well-developed and well-nourished. No distress.  HENT:  Head: Normocephalic and atraumatic.  Mouth/Throat: No oropharyngeal exudate.  Eyes: Conjunctivae are normal. Right eye exhibits no discharge. Left eye exhibits no discharge. No scleral icterus.  Neck: Normal range of motion. Neck supple. No tracheal deviation present. No thyromegaly present.  Pulmonary/Chest: Effort normal. No stridor. No respiratory distress.  Musculoskeletal: Normal range of motion. She exhibits no edema and no tenderness.  Lymphadenopathy:    She has no cervical adenopathy.  Neurological: She is alert and oriented to person, place, and time. She exhibits normal muscle tone.  Skin: Skin is warm and dry. No rash noted. She is not diaphoretic. No erythema.  Psychiatric: She has a normal mood and affect. Her behavior is normal. Judgment and thought content normal.      Assessment & Plan:   Please see problem oriented charting.

## 2013-02-01 NOTE — Assessment & Plan Note (Signed)
Tramadol does not improve chronic back pain.  It was therefore stopped.  Will continue the fentanyl and the narco and reassess pain control at the follow-up visit.

## 2013-02-01 NOTE — Assessment & Plan Note (Signed)
Her right shoulder pain has returned after about 6 months of pain control from a steroid injection.  Informed consent was provided and a timeout was incorporated.  She was prepped and anesthesia was achieved with cold spray.  Three attempts to enter the joint space with a 27 gauge needle were made, but the joint space could not be accessed.  The procedure was aborted.  We will send a referral to Sports Medicine to reassess her shoulder and consider a right shoulder injection for relief of her bursitis.

## 2013-02-03 DIAGNOSIS — M67919 Unspecified disorder of synovium and tendon, unspecified shoulder: Secondary | ICD-10-CM | POA: Diagnosis not present

## 2013-02-04 ENCOUNTER — Other Ambulatory Visit: Payer: Self-pay | Admitting: Infectious Diseases

## 2013-02-04 ENCOUNTER — Other Ambulatory Visit: Payer: Self-pay | Admitting: Internal Medicine

## 2013-02-04 ENCOUNTER — Other Ambulatory Visit: Payer: Self-pay | Admitting: Infectious Disease

## 2013-02-04 DIAGNOSIS — E785 Hyperlipidemia, unspecified: Secondary | ICD-10-CM

## 2013-02-04 DIAGNOSIS — F17200 Nicotine dependence, unspecified, uncomplicated: Secondary | ICD-10-CM

## 2013-02-10 DIAGNOSIS — B353 Tinea pedis: Secondary | ICD-10-CM | POA: Diagnosis not present

## 2013-02-10 DIAGNOSIS — M79609 Pain in unspecified limb: Secondary | ICD-10-CM | POA: Diagnosis not present

## 2013-02-18 ENCOUNTER — Other Ambulatory Visit: Payer: Self-pay | Admitting: *Deleted

## 2013-02-18 ENCOUNTER — Telehealth: Payer: Self-pay | Admitting: *Deleted

## 2013-02-18 DIAGNOSIS — M5136 Other intervertebral disc degeneration, lumbar region: Secondary | ICD-10-CM

## 2013-02-18 DIAGNOSIS — F419 Anxiety disorder, unspecified: Secondary | ICD-10-CM

## 2013-02-18 MED ORDER — FENTANYL 50 MCG/HR TD PT72: 50.0000 ug | MEDICATED_PATCH | TRANSDERMAL | Status: DC

## 2013-02-18 MED ORDER — ALPRAZOLAM 1 MG PO TABS: 1.0000 mg | ORAL_TABLET | Freq: Three times a day (TID) | ORAL | Status: DC | PRN

## 2013-02-18 MED ORDER — HYDROCODONE-ACETAMINOPHEN 10-325 MG PO TABS: 1.0000 | ORAL_TABLET | Freq: Three times a day (TID) | ORAL | Status: DC | PRN

## 2013-02-18 NOTE — Telephone Encounter (Signed)
Received call from [facility] with Infectious Disease.  ID would like to know if pt's PCP can take over prescribing her pain medications which includes Duragesic patches and Norco 10-325 starting next month.  Prescriptions for both medications will be written today by Dr. Daiva Eves.  Pt's next appt with Dr. Josem Kaufmann is 06/20/2013 for a 6 mth follow up. Will forward to PCP for review.  Please advise.Kingsley Spittle Cassady12/9/201412:18 PM

## 2013-02-19 ENCOUNTER — Encounter: Payer: Self-pay | Admitting: Internal Medicine

## 2013-02-19 MED ORDER — HYDROCODONE-ACETAMINOPHEN 10-325 MG PO TABS: 1.0000 | ORAL_TABLET | Freq: Three times a day (TID) | ORAL | Status: DC | PRN

## 2013-02-19 MED ORDER — FENTANYL 50 MCG/HR TD PT72: 50.0000 ug | MEDICATED_PATCH | TRANSDERMAL | Status: DC

## 2013-02-19 NOTE — Telephone Encounter (Signed)
I would be happy to take over this responsibility.  I have pre-written the next two months of medications after Dr. Zenaida Niece Dam's prescription this month.  She will need to sign a pain contract with [name] at the next visit.

## 2013-02-24 DIAGNOSIS — B353 Tinea pedis: Secondary | ICD-10-CM | POA: Diagnosis not present

## 2013-02-24 DIAGNOSIS — M79609 Pain in unspecified limb: Secondary | ICD-10-CM | POA: Diagnosis not present

## 2013-02-28 ENCOUNTER — Encounter (HOSPITAL_COMMUNITY): Payer: Self-pay | Admitting: Emergency Medicine

## 2013-02-28 ENCOUNTER — Emergency Department (HOSPITAL_COMMUNITY)
Admission: EM | Admit: 2013-02-28 | Discharge: 2013-02-28 | Disposition: A | Discharge status: Home / Self Care | Payer: Medicare Other | Attending: Emergency Medicine | Admitting: Emergency Medicine

## 2013-02-28 DIAGNOSIS — F411 Generalized anxiety disorder: Secondary | ICD-10-CM | POA: Insufficient documentation

## 2013-02-28 DIAGNOSIS — H409 Unspecified glaucoma: Secondary | ICD-10-CM | POA: Insufficient documentation

## 2013-02-28 DIAGNOSIS — R011 Cardiac murmur, unspecified: Secondary | ICD-10-CM | POA: Diagnosis not present

## 2013-02-28 DIAGNOSIS — I10 Essential (primary) hypertension: Secondary | ICD-10-CM | POA: Insufficient documentation

## 2013-02-28 DIAGNOSIS — F17200 Nicotine dependence, unspecified, uncomplicated: Secondary | ICD-10-CM | POA: Diagnosis not present

## 2013-02-28 DIAGNOSIS — Z872 Personal history of diseases of the skin and subcutaneous tissue: Secondary | ICD-10-CM | POA: Diagnosis not present

## 2013-02-28 DIAGNOSIS — Z79899 Other long term (current) drug therapy: Secondary | ICD-10-CM | POA: Insufficient documentation

## 2013-02-28 DIAGNOSIS — J329 Chronic sinusitis, unspecified: Secondary | ICD-10-CM

## 2013-02-28 DIAGNOSIS — Z7982 Long term (current) use of aspirin: Secondary | ICD-10-CM | POA: Insufficient documentation

## 2013-02-28 DIAGNOSIS — IMO0002 Reserved for concepts with insufficient information to code with codable children: Secondary | ICD-10-CM | POA: Insufficient documentation

## 2013-02-28 DIAGNOSIS — Z87448 Personal history of other diseases of urinary system: Secondary | ICD-10-CM | POA: Diagnosis not present

## 2013-02-28 DIAGNOSIS — K5909 Other constipation: Secondary | ICD-10-CM | POA: Insufficient documentation

## 2013-02-28 DIAGNOSIS — M810 Age-related osteoporosis without current pathological fracture: Secondary | ICD-10-CM | POA: Insufficient documentation

## 2013-02-28 DIAGNOSIS — Z21 Asymptomatic human immunodeficiency virus [HIV] infection status: Secondary | ICD-10-CM | POA: Insufficient documentation

## 2013-02-28 DIAGNOSIS — Z8619 Personal history of other infectious and parasitic diseases: Secondary | ICD-10-CM | POA: Insufficient documentation

## 2013-02-28 DIAGNOSIS — Z792 Long term (current) use of antibiotics: Secondary | ICD-10-CM | POA: Insufficient documentation

## 2013-02-28 DIAGNOSIS — E785 Hyperlipidemia, unspecified: Secondary | ICD-10-CM | POA: Diagnosis not present

## 2013-02-28 DIAGNOSIS — J019 Acute sinusitis, unspecified: Secondary | ICD-10-CM | POA: Diagnosis not present

## 2013-02-28 MED ORDER — AMOXICILLIN 500 MG PO CAPS: 500.0000 mg | ORAL_CAPSULE | Freq: Three times a day (TID) | ORAL | Status: DC

## 2013-02-28 MED ORDER — HYDROCODONE-ACETAMINOPHEN 5-325 MG PO TABS: 1.0000 | ORAL_TABLET | Freq: Four times a day (QID) | ORAL | Status: DC | PRN

## 2013-02-28 NOTE — ED Notes (Signed)
Patient c/o left nostril irritation. Per patient had cold in November treated with saline solution and humidifier. Per patient stopped using humidifier for 2 nights and pain in left nostril became unbearable. Patient also reports headache on left side.

## 2013-02-28 NOTE — ED Provider Notes (Signed)
CSN: [id number]     Arrival date & time 02/28/13  0424 History   First MD Initiated Contact with Patient 02/28/13 [phone]     Chief Complaint  Patient presents with  . nostril irritation   . Headache   (Consider location/radiation/quality/duration/timing/severity/associated sxs/prior Treatment) Patient is a 72 y.o. female presenting with headaches. The history is provided by the patient (the pt complains of sinus congestion and left nostril pain).  Headache Pain location:  Frontal Radiates to:  Does not radiate Severity currently:  4/10 Severity at highest:  8/10 Onset quality:  Gradual Timing:  Constant Progression:  Unchanged Chronicity:  New Context: not activity   Associated symptoms: no abdominal pain, no back pain, no congestion, no cough, no diarrhea, no fatigue, no seizures and no sinus pressure     Past Medical History  Diagnosis Date  . Human immunodeficiency virus disease 03/27/1986  . Tobacco abuse 02/19/2012  . Essential hypertension 07/20/2006  . Hyperlipidemia LDL goal < 100 04/05/2012  . 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 Lumbar degenerative disc disease 07/20/2006    With chronic back pain   . 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 Small bowel obstruction due to adhesions 02/08/2012    s/p Exploratory laparotomy, lysis of adhesions 02/12/12    . Constipation due to pain medication 04/27/2010  . Diverticulosis 02/08/2012    Extensive left-sided diverticula on colonoscopy March 2012 per Dr. Jena Gauss   . Genital herpes 07/20/2006  . 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 Seborrhea 09/01/2010  . Anxiety 04/05/2012  . Bursitis of right shoulder 07/12/2012    s/p shoulder injection 07/12/2012   . Voiding dysfunction     s/p cystoscopy and meatal dilation Dec 2005  . Cataract of right eye   . Heart murmur 1961  . Blood transfusion without reported diagnosis   . Right rotator cuff  tear 02/01/2013    Responds to periodic steroid injections   Past Surgical History  Procedure Laterality Date  . Eye surgery    . Aorto-femoral bypass graft  04/2007  . Appendectomy    . Cholecystectomy    . Colectomy  01/2011    Dr. Jamey Ripa; "took out 12 inches of small intestiines and removed blockage"  . Abdominal hysterectomy    . Laparotomy  02/12/2012    Procedure: EXPLORATORY LAPAROTOMY;  Surgeon: Almond Lint, MD;  Location: [facility] OR;  Service: General;  Laterality: N/A;  Exploratory Laparotomy, lysis of adhesions  . Breast surgery      Breast biopsy: negative  . Small intestine surgery     Family History  Problem Relation Age of Onset  . Kidney failure Mother   . Diabetes Mother   . Hypertension Mother   . Heart disease Mother   . Glaucoma Father   . Congestive Heart Failure Sister   . Diabetes Sister   . Kidney disease Sister   . Diabetes Brother   . Unexplained death Brother 69    Automobile accident  . Hypothyroidism Daughter   . Arthritis Daughter     Neck/Back  . Healthy Son   . HIV/AIDS Brother   . HIV Daughter   . Kidney disease Daughter   . Arthritis Son     Knee   History  Substance Use Topics  . Smoking status: Current Every Day Smoker -- 0.10 packs/day for 50 years  Types: Cigars  . Smokeless tobacco: Never Used     Comment: using chantix- smokes black and mild cigars  . Alcohol Use: No     Comment: "last drink of alcohol ~ 1977"   OB History   Grav Para Term Preterm Abortions TAB SAB Ect Mult Living   6 4 4  2  2   4      Review of Systems  Constitutional: Negative for appetite change and fatigue.  HENT: Negative for congestion, ear discharge and sinus pressure.        Nose pain  Eyes: Negative for discharge.  Respiratory: Negative for cough.   Cardiovascular: Negative for chest pain.  Gastrointestinal: Negative for abdominal pain and diarrhea.  Genitourinary: Negative for frequency and hematuria.  Musculoskeletal: Negative for back  pain.  Skin: Negative for rash.  Neurological: Positive for headaches. Negative for seizures.  Psychiatric/Behavioral: Negative for hallucinations.    Allergies  Review of patient's allergies indicates no known allergies.  Home Medications   Current Outpatient Rx  Name  Route  Sig  Dispense  Refill  . acyclovir (ZOVIRAX) 400 MG tablet   Oral   Take 1 tablet (400 mg total) by mouth 3 (three) times daily. As needed for herpes outbreak.  Uses for 7 days.   21 tablet   1   . alendronate (FOSAMAX) 70 MG tablet   Oral   Take 70 mg by mouth every 7 (seven) days. Take with a full glass of water on an empty stomach.         . ALPRAZolam (XANAX) 1 MG tablet   Oral   Take 1 tablet (1 mg total) by mouth 3 (three) times daily as needed for anxiety.   90 tablet   0   . amLODipine (NORVASC) 10 MG tablet   Oral   Take 1 tablet (10 mg total) by mouth daily.   90 tablet   3   . amoxicillin (AMOXIL) 500 MG capsule   Oral   Take 1 capsule (500 mg total) by mouth 3 (three) times daily.   42 capsule   0   . aspirin 81 MG EC tablet   Oral   Take 81 mg by mouth daily.           . benazepril (LOTENSIN) 40 MG tablet      TAKE 1 TABLET BY MOUTH DAILY.   30 tablet   11     Generic For:*LOTENSIN   40MG  Generic For:*LOTENSIN ...   . Calcium Carbonate Antacid (ROLAIDS EXTRA STRENGTH PO)   Oral   Take by mouth as needed.         . calcium-vitamin D (OSCAL WITH D) 500-200 MG-UNIT per tablet   Oral   Take 1 tablet by mouth 2 (two) times daily.         . CHANTIX 1 MG tablet      TAKE 1 TABLET BY MOUTH TWICE DAILY   60 tablet   1     need refills   . COMBIGAN 0.2-0.5 % ophthalmic solution   Left Eye   Place 1 drop into the left eye 2 (two) times daily.         . cyclobenzaprine (FLEXERIL) 10 MG tablet   Oral   Take 1 tablet (10 mg total) by mouth 3 (three) times daily as needed for muscle spasms.   90 tablet   11   . docusate sodium (COLACE) 100 MG capsule    Oral  Take 100 mg by mouth 2 (two) times daily.         Marland Kitchen efavirenz-emtricitabine-tenofovir (ATRIPLA) 600-200-300 MG per tablet   Oral   Take 1 tablet by mouth at bedtime.   30 tablet   6   . ENSURE (ENSURE)   Oral   Take 237 mLs by mouth 2 (two) times daily.   237 mL   11   . fentaNYL (DURAGESIC) 50 MCG/HR   Transdermal   Place 1 patch (50 mcg total) onto the skin every 3 (three) days.   10 patch   0     Please do not refill before 04/19/2013   . hydrochlorothiazide (HYDRODIURIL) 25 MG tablet   Oral   Take 1 tablet (25 mg total) by mouth daily.   90 tablet   3   . HYDROcodone-acetaminophen (NORCO) 10-325 MG per tablet   Oral   Take 1 tablet by mouth 3 (three) times daily as needed for severe pain.   60 tablet   0     Please do not refill before 04/19/2013   . HYDROcodone-acetaminophen (NORCO/VICODIN) 5-325 MG per tablet   Oral   Take 1 tablet by mouth every 6 (six) hours as needed for moderate pain.   20 tablet   0   . ibuprofen (ADVIL,MOTRIN) 200 MG tablet   Oral   Take 400 mg by mouth 3 (three) times daily as needed for pain.         Marland Kitchen lovastatin (MEVACOR) 40 MG tablet      TAKE 1 TABLET BY MOUTH AT BEDTIME   30 tablet   5     Generic ZOX:WRUEAVW   40MG    . megestrol (MEGACE) 40 MG tablet      TAKE 2 TABLETS BY MOUTH TWICE DAILY   120 tablet   3     Generic UJW:JXBJYN 40 MG TABLET need refill   . Multiple Vitamin (MULTIVITAMIN WITH MINERALS) TABS   Oral   Take 1 tablet by mouth daily.         . sorbitol 70 % SOLN   Oral   Take 15 mLs by mouth daily as needed (mix with coffee, juice, water).   500 mL   1   . triamcinolone ointment (KENALOG) 0.1 %      APPLY 1 APPLICATION TWICE DAILY   454 g   30     Generic WGN:FAOZHYQMVH A 0.1% OINTMENT Generic For ...    BP 179/84  Pulse 85  Temp(Src) 98.3 F (36.8 C) (Oral)  Resp 16  Ht 5\' 8"  (1.727 m)  Wt 150 lb (68.04 kg)  BMI 22.81 kg/m2  SpO2 100% Physical Exam   Constitutional: She is oriented to person, place, and time. She appears well-developed.  HENT:  Head: Normocephalic.  Swelling in both nostrils  Eyes: Conjunctivae and EOM are normal. No scleral icterus.  Neck: Neck supple. No thyromegaly present.  Cardiovascular: Normal rate and regular rhythm.  Exam reveals no gallop and no friction rub.   No murmur heard. Pulmonary/Chest: No stridor. She has no wheezes. She has no rales. She exhibits no tenderness.  Abdominal: She exhibits no distension. There is no tenderness. There is no rebound.  Musculoskeletal: Normal range of motion. She exhibits no edema.  Lymphadenopathy:    She has no cervical adenopathy.  Neurological: She is oriented to person, place, and time. She exhibits normal muscle tone. Coordination normal.  Skin: No rash noted. No erythema.  Psychiatric: She has a  normal mood and affect. Her behavior is normal.    ED Course  Procedures (including critical care time) Labs Review Labs Reviewed - No data to display Imaging Review No results found.  EKG Interpretation   None       MDM   1. Sinusitis        Benny Lennert, MD 02/28/13 [phone]

## 2013-03-10 ENCOUNTER — Ambulatory Visit: Payer: Medicare Other

## 2013-03-18 ENCOUNTER — Other Ambulatory Visit: Payer: Self-pay | Admitting: Licensed Clinical Social Worker

## 2013-03-18 DIAGNOSIS — F419 Anxiety disorder, unspecified: Secondary | ICD-10-CM

## 2013-03-18 DIAGNOSIS — M5136 Other intervertebral disc degeneration, lumbar region: Secondary | ICD-10-CM

## 2013-03-18 MED ORDER — ALPRAZOLAM 1 MG PO TABS: 1.0000 mg | ORAL_TABLET | Freq: Three times a day (TID) | ORAL | Status: DC | PRN

## 2013-03-18 NOTE — Telephone Encounter (Signed)
Patient is receiving pain medications from Dr. Eppie Gibson at [facility]. Patient is still getting Xanax from Dr. Tommy Medal

## 2013-03-19 ENCOUNTER — Other Ambulatory Visit: Payer: Medicare Other

## 2013-03-19 DIAGNOSIS — B20 Human immunodeficiency virus [HIV] disease: Secondary | ICD-10-CM | POA: Diagnosis not present

## 2013-03-19 DIAGNOSIS — Z113 Encounter for screening for infections with a predominantly sexual mode of transmission: Secondary | ICD-10-CM | POA: Diagnosis not present

## 2013-03-19 DIAGNOSIS — E785 Hyperlipidemia, unspecified: Secondary | ICD-10-CM

## 2013-03-19 LAB — RPR

## 2013-03-19 LAB — CBC WITH DIFFERENTIAL/PLATELET
Basophils Absolute: 0 10*3/uL (ref 0.0–0.1)
Basophils Relative: 0 % (ref 0–1)
Eosinophils Absolute: 0.1 10*3/uL (ref 0.0–0.7)
Eosinophils Relative: 1 % (ref 0–5)
HEMATOCRIT: 35.9 % — AB (ref 36.0–46.0)
Hemoglobin: 12.9 g/dL (ref 12.0–15.0)
LYMPHS ABS: 3.8 10*3/uL (ref 0.7–4.0)
LYMPHS PCT: 46 % (ref 12–46)
MCH: 33.9 pg (ref 26.0–34.0)
MCHC: 35.9 g/dL (ref 30.0–36.0)
MCV: 94.5 fL (ref 78.0–100.0)
MONO ABS: 0.3 10*3/uL (ref 0.1–1.0)
Monocytes Relative: 4 % (ref 3–12)
Neutro Abs: 4.1 10*3/uL (ref 1.7–7.7)
Neutrophils Relative %: 49 % (ref 43–77)
Platelets: 361 10*3/uL (ref 150–400)
RBC: 3.8 MIL/uL — AB (ref 3.87–5.11)
RDW: 14.1 % (ref 11.5–15.5)
WBC: 8.3 10*3/uL (ref 4.0–10.5)

## 2013-03-19 LAB — LIPID PANEL
Cholesterol: 135 mg/dL (ref 0–200)
HDL: 60 mg/dL (ref >39)
LDL CALC: 57 mg/dL (ref 0–99)
Total CHOL/HDL Ratio: 2.3 Ratio
Triglycerides: 88 mg/dL (ref <150)
VLDL: 18 mg/dL (ref 0–40)

## 2013-03-19 LAB — COMPLETE METABOLIC PANEL WITH GFR
ALT: 13 U/L (ref 0–35)
AST: 18 U/L (ref 0–37)
Albumin: 4 g/dL (ref 3.5–5.2)
Alkaline Phosphatase: 45 U/L (ref 39–117)
BUN: 17 mg/dL (ref 6–23)
CALCIUM: 9.2 mg/dL (ref 8.4–10.5)
CHLORIDE: 108 meq/L (ref 96–112)
CO2: 21 meq/L (ref 19–32)
Creat: 0.9 mg/dL (ref 0.50–1.10)
GFR, Est African American: 75 mL/min
GFR, Est Non African American: 65 mL/min
Glucose, Bld: 120 mg/dL — ABNORMAL HIGH (ref 70–99)
Potassium: 3.5 mEq/L (ref 3.5–5.3)
Sodium: 139 mEq/L (ref 135–145)
Total Bilirubin: 0.2 mg/dL — ABNORMAL LOW (ref 0.3–1.2)
Total Protein: 7.4 g/dL (ref 6.0–8.3)

## 2013-03-20 LAB — T-HELPER CELL (CD4) - (RCID CLINIC ONLY)
CD4 % Helper T Cell: 42 % (ref 33–55)
CD4 T CELL ABS: 1760 /uL (ref 400–2700)

## 2013-03-21 LAB — HIV-1 RNA QUANT-NO REFLEX-BLD: HIV 1 RNA Quant: <20 copies/mL (ref <20)

## 2013-03-22 LAB — HLA B*5701: HLA-B*5701 w/rflx HLA-B High: NEGATIVE

## 2013-04-02 ENCOUNTER — Other Ambulatory Visit: Payer: Self-pay | Admitting: Licensed Clinical Social Worker

## 2013-04-02 ENCOUNTER — Ambulatory Visit: Payer: Medicare Other

## 2013-04-02 ENCOUNTER — Ambulatory Visit (INDEPENDENT_AMBULATORY_CARE_PROVIDER_SITE_OTHER): Payer: Medicare Other | Admitting: Infectious Disease

## 2013-04-02 ENCOUNTER — Encounter: Payer: Self-pay | Admitting: Infectious Disease

## 2013-04-02 VITALS — BP 168/79 | HR 80 | Temp 98.4°F | Wt 151.0 lb

## 2013-04-02 DIAGNOSIS — B029 Zoster without complications: Secondary | ICD-10-CM | POA: Diagnosis not present

## 2013-04-02 DIAGNOSIS — J329 Chronic sinusitis, unspecified: Secondary | ICD-10-CM | POA: Insufficient documentation

## 2013-04-02 DIAGNOSIS — I10 Essential (primary) hypertension: Secondary | ICD-10-CM | POA: Diagnosis not present

## 2013-04-02 DIAGNOSIS — G894 Chronic pain syndrome: Secondary | ICD-10-CM | POA: Diagnosis not present

## 2013-04-02 DIAGNOSIS — B20 Human immunodeficiency virus [HIV] disease: Secondary | ICD-10-CM

## 2013-04-02 MED ORDER — EFAVIRENZ-EMTRICITAB-TENOFOVIR 600-200-300 MG PO TABS: 1.0000 | ORAL_TABLET | Freq: Every day | ORAL | Status: DC

## 2013-04-02 MED ORDER — FLUTICASONE PROPIONATE 50 MCG/ACT NA SUSP: 2.0000 | Freq: Every day | NASAL | Status: DC

## 2013-04-02 MED ORDER — AMOXICILLIN-POT CLAVULANATE 875-125 MG PO TABS: 1.0000 | ORAL_TABLET | Freq: Two times a day (BID) | ORAL | Status: DC

## 2013-04-02 MED ORDER — ACYCLOVIR 400 MG PO TABS: 400.0000 mg | ORAL_TABLET | Freq: Three times a day (TID) | ORAL | Status: DC

## 2013-04-02 NOTE — Progress Notes (Signed)
Subjective:    Patient ID: Jessica [name], female    DOB: 05/16/1940, 73 y.o.   MRN: [id number]  HPI   Jessica [name] is a 73 y.o. female with HIV infection who is doing superbly well on her antiviral regimen, Atripla with undetectable viral load and health cd4 count.   She does have PVD and is seen by Cardiology, [facility] having undergon bypass surgery and seen by Dr Eppie Gibson in [facility] clinic  She came down with a sinus infection in December with cough and saw Dr. Eppie Gibson and maneuvers with OTC meds, humidified air improved her cough symptoms but she continued to have severe headaches, sinus pain and was seen in ED at [facility] where she was given rx for Amoxicillin.  Amoxicillin improved her ssx but they then worsened again and she was referred to ENT but she did not have the $150 copay at the door and was not seen  She states that she still has severe HA and ear aches and burning in her sinuses.  ON a separate note she did take rx for VICODIn fom ED despite the fact that I am confident that she is under pain contract with Dr. Eppie Gibson. Upon questioning she said that she did sign contract to get her pain meds from Dr Eppie Gibson. I informed her that her takign vicodin from ED could be seen as violation of her pain contract at that it was her responsibility NOT to take rx narcotics from anyone other than Dr Eppie Gibson IF she truly has such a pain contract.     Review of Systems  Constitutional: Negative for fever, chills, diaphoresis, activity change, appetite change, fatigue and unexpected weight change.  HENT: Positive for ear pain, rhinorrhea and sinus pressure. Negative for congestion, sneezing, sore throat and trouble swallowing.   Eyes: Negative for photophobia and visual disturbance.  Respiratory: Negative for cough, chest tightness, shortness of breath, wheezing and stridor.   Cardiovascular: Negative for chest pain, palpitations and leg swelling.  Gastrointestinal: Negative for nausea,  vomiting, abdominal pain, diarrhea, constipation, blood in stool, abdominal distention and anal bleeding.  Genitourinary: Negative for dysuria, hematuria, flank pain and difficulty urinating.  Musculoskeletal: Negative for arthralgias, back pain, gait problem, joint swelling and myalgias.  Skin: Positive for rash. Negative for color change, pallor and wound.  Neurological: Negative for dizziness, tremors, weakness and light-headedness.  Hematological: Negative for adenopathy. Does not bruise/bleed easily.  Psychiatric/Behavioral: Negative for behavioral problems, confusion, sleep disturbance, dysphoric mood, decreased concentration and agitation.       Objective:   Physical Exam  Constitutional: She is oriented to person, place, and time. She appears well-developed and well-nourished. No distress.  HENT:  Head: Normocephalic and atraumatic.  Right Ear: Tympanic membrane is bulging. A middle ear effusion is present.  Left Ear: A middle ear effusion is present.  Nose: Mucosal edema present. Right sinus exhibits frontal sinus tenderness. Left sinus exhibits frontal sinus tenderness.  Mouth/Throat: Oropharynx is clear and moist. No oropharyngeal exudate.  Eyes: Conjunctivae and EOM are normal. Pupils are equal, round, and reactive to light. No scleral icterus.  Neck: Normal range of motion. Neck supple. No JVD present.  Cardiovascular: Normal rate, regular rhythm and normal heart sounds.  Exam reveals no gallop and no friction rub.   No murmur heard. Pulmonary/Chest: Effort normal and breath sounds normal. No respiratory distress. She has no wheezes. She has no rales. She exhibits no tenderness.  Abdominal: She exhibits no distension and no mass. There is no  tenderness. There is no rebound and no guarding.  Musculoskeletal: She exhibits no edema and no tenderness.  Lymphadenopathy:    She has no cervical adenopathy.  Neurological: She is alert and oriented to person, place, and time. She  exhibits normal muscle tone. Coordination normal.  Skin: Skin is warm and dry. She is not diaphoretic. No erythema. No pallor.     Psychiatric: She has a normal mood and affect. Her behavior is normal. Judgment and thought content normal.          Assessment & Plan:   Persistent sinusitis:   Will give her a more protracted course of abx, Augmentin for one month  Will give her inhaled fluticasone  She is to let us know if this worsens  Can be seen by ENT once she has her check  I spent greater than 25 minutes with the patient including greater than 50% of time in face to face counsel of the patient and in coordination of their care.  Chronic pain: I was VERY explicit with her that she would need to explain her lack of understanding of nature of pain contract to Dr. Eppie Gibson. She was forthright with me that she had been given vicodin from ED so I do not think she was hiding anything and it may be that int he past her prior HIV provider (my predecessor) was not so explicit  HIV: Perfect control continue current antiretroviral regimen. SHE NEEDS SPAP renewal for March thur Sept  HTN: Blood pressure elevated today, keep log, fu with Dr. Eppie Gibson

## 2013-04-18 ENCOUNTER — Other Ambulatory Visit: Payer: Self-pay | Admitting: *Deleted

## 2013-04-18 NOTE — Telephone Encounter (Signed)
Pt Rx are ready for pick up Pt informed

## 2013-04-21 ENCOUNTER — Other Ambulatory Visit: Payer: Self-pay | Admitting: Licensed Clinical Social Worker

## 2013-04-21 DIAGNOSIS — F419 Anxiety disorder, unspecified: Secondary | ICD-10-CM

## 2013-04-21 MED ORDER — ALPRAZOLAM 1 MG PO TABS: 1.0000 mg | ORAL_TABLET | Freq: Three times a day (TID) | ORAL | Status: DC | PRN

## 2013-05-05 ENCOUNTER — Other Ambulatory Visit: Payer: Self-pay | Admitting: *Deleted

## 2013-05-05 DIAGNOSIS — M810 Age-related osteoporosis without current pathological fracture: Secondary | ICD-10-CM

## 2013-05-05 MED ORDER — ALENDRONATE SODIUM 70 MG PO TABS: 70.0000 mg | ORAL_TABLET | ORAL | Status: DC

## 2013-05-16 ENCOUNTER — Other Ambulatory Visit: Payer: Self-pay | Admitting: *Deleted

## 2013-05-16 DIAGNOSIS — B029 Zoster without complications: Secondary | ICD-10-CM

## 2013-05-16 MED ORDER — ACYCLOVIR 400 MG PO TABS: 400.0000 mg | ORAL_TABLET | Freq: Three times a day (TID) | ORAL | Status: DC

## 2013-05-21 ENCOUNTER — Telehealth: Payer: Self-pay | Admitting: *Deleted

## 2013-05-21 DIAGNOSIS — F419 Anxiety disorder, unspecified: Secondary | ICD-10-CM

## 2013-05-21 MED ORDER — ALPRAZOLAM 1 MG PO TABS: 1.0000 mg | ORAL_TABLET | Freq: Three times a day (TID) | ORAL | Status: DC | PRN

## 2013-05-21 NOTE — Telephone Encounter (Signed)
Refilled OKed by Dr. Tommy Medal.  Pt is going to discuss with Dr. Virgina Jock at her next visit, 06/20/13, about taking over this medication and its refills.  Pt verbalized that she didn't feel she needed to make a "special appt" with Dr. Eppie Gibson to discuss this request.

## 2013-05-23 ENCOUNTER — Other Ambulatory Visit: Payer: Self-pay | Admitting: *Deleted

## 2013-05-23 DIAGNOSIS — M5136 Other intervertebral disc degeneration, lumbar region: Secondary | ICD-10-CM

## 2013-05-23 MED ORDER — HYDROCODONE-ACETAMINOPHEN 10-325 MG PO TABS: 1.0000 | ORAL_TABLET | Freq: Three times a day (TID) | ORAL | Status: DC | PRN

## 2013-05-23 MED ORDER — FENTANYL 50 MCG/HR TD PT72: 50.0000 ug | MEDICATED_PATCH | TRANSDERMAL | Status: DC

## 2013-05-23 NOTE — Telephone Encounter (Signed)
Last filled 2/9

## 2013-05-29 ENCOUNTER — Other Ambulatory Visit: Payer: Self-pay | Admitting: Internal Medicine

## 2013-05-29 ENCOUNTER — Other Ambulatory Visit: Payer: Self-pay | Admitting: Infectious Disease

## 2013-05-29 DIAGNOSIS — I10 Essential (primary) hypertension: Secondary | ICD-10-CM

## 2013-05-29 DIAGNOSIS — M810 Age-related osteoporosis without current pathological fracture: Secondary | ICD-10-CM

## 2013-05-30 ENCOUNTER — Other Ambulatory Visit: Payer: Self-pay | Admitting: *Deleted

## 2013-05-30 MED ORDER — MEGESTROL ACETATE 40 MG PO TABS: ORAL_TABLET | ORAL | Status: DC

## 2013-06-03 ENCOUNTER — Other Ambulatory Visit (HOSPITAL_COMMUNITY): Payer: Self-pay | Admitting: Infectious Disease

## 2013-06-20 ENCOUNTER — Encounter: Payer: Self-pay | Admitting: Internal Medicine

## 2013-06-20 ENCOUNTER — Ambulatory Visit (INDEPENDENT_AMBULATORY_CARE_PROVIDER_SITE_OTHER): Payer: Medicare Other | Admitting: Internal Medicine

## 2013-06-20 VITALS — BP 164/93 | HR 91 | Temp 98.9°F | Wt 150.7 lb

## 2013-06-20 DIAGNOSIS — N390 Urinary tract infection, site not specified: Secondary | ICD-10-CM | POA: Diagnosis not present

## 2013-06-20 DIAGNOSIS — B20 Human immunodeficiency virus [HIV] disease: Secondary | ICD-10-CM

## 2013-06-20 DIAGNOSIS — I10 Essential (primary) hypertension: Secondary | ICD-10-CM

## 2013-06-20 DIAGNOSIS — M5137 Other intervertebral disc degeneration, lumbosacral region: Secondary | ICD-10-CM

## 2013-06-20 DIAGNOSIS — F17200 Nicotine dependence, unspecified, uncomplicated: Secondary | ICD-10-CM | POA: Diagnosis not present

## 2013-06-20 DIAGNOSIS — M5136 Other intervertebral disc degeneration, lumbar region: Secondary | ICD-10-CM

## 2013-06-20 DIAGNOSIS — J329 Chronic sinusitis, unspecified: Secondary | ICD-10-CM

## 2013-06-20 DIAGNOSIS — F411 Generalized anxiety disorder: Secondary | ICD-10-CM | POA: Diagnosis not present

## 2013-06-20 DIAGNOSIS — E785 Hyperlipidemia, unspecified: Secondary | ICD-10-CM | POA: Diagnosis not present

## 2013-06-20 DIAGNOSIS — F419 Anxiety disorder, unspecified: Secondary | ICD-10-CM

## 2013-06-20 DIAGNOSIS — M810 Age-related osteoporosis without current pathological fracture: Secondary | ICD-10-CM

## 2013-06-20 DIAGNOSIS — Z720 Tobacco use: Secondary | ICD-10-CM

## 2013-06-20 MED ORDER — FENTANYL 50 MCG/HR TD PT72: 50.0000 ug | MEDICATED_PATCH | TRANSDERMAL | Status: DC

## 2013-06-20 MED ORDER — ALPRAZOLAM 1 MG PO TABS: 1.0000 mg | ORAL_TABLET | Freq: Three times a day (TID) | ORAL | Status: DC | PRN

## 2013-06-20 MED ORDER — SULFAMETHOXAZOLE-TMP DS 800-160 MG PO TABS: 1.0000 | ORAL_TABLET | Freq: Two times a day (BID) | ORAL | Status: DC

## 2013-06-20 MED ORDER — ALENDRONATE SODIUM 70 MG PO TABS: 70.0000 mg | ORAL_TABLET | ORAL | Status: DC

## 2013-06-20 MED ORDER — HYDROCODONE-ACETAMINOPHEN 10-325 MG PO TABS: 1.0000 | ORAL_TABLET | Freq: Three times a day (TID) | ORAL | Status: DC | PRN

## 2013-06-20 MED ORDER — ENSURE PO LIQD: 237.0000 mL | Freq: Two times a day (BID) | ORAL | Status: DC

## 2013-06-20 MED ORDER — ATENOLOL 25 MG PO TABS: 25.0000 mg | ORAL_TABLET | Freq: Every day | ORAL | Status: DC

## 2013-06-20 NOTE — Assessment & Plan Note (Signed)
Her headaches were felt to be secondary to chronic sinusitis. She was given a course of Augmentin with resolution of her symptoms. We will continue to follow symptomatically.

## 2013-06-20 NOTE — Assessment & Plan Note (Signed)
Her back pain remains reasonably well controlled on the fentanyl patch 50 mcg every 3 days, cyclobenzaprine 10 mg every 8 hours as needed for muscle spasms, hydrocodone acetaminophen 10-325 one tablet every 8 hours as needed for pain, and ibuprofen 400 mg by mouth every 8 hours as needed for pain. Dr. Tommy Medal asked that I assume the responsibilities of the narcotic prescribing from them. Dr. Orene Desanctis had previously prescribed her narcotics for several years. I more than happy to take over this responsibility. She therefore signed a pain contract and I provided her with today's prescriptions as well as the following 2 months. Given that she lives in [place], and I trust her, this should improve her convenience and assure she has access to her pain medications which are effective. We also discussed the risks associated with Flexeril use in patients 70 and older. She felt this medication was crucial to controlling her back pain, and understanding the risks, she felt the benefits are much greater and wishes to continue. We therefore will continue the cyclobenzaprine 10 mg by mouth every 8 hours as needed for muscle pain and spasm.

## 2013-06-20 NOTE — Assessment & Plan Note (Signed)
Her blood pressure today is 164/93 which is above target. This is on amlodipine 10 mg by mouth daily, benazepril 40 mg by mouth daily, and hydrochlorothiazide 25 mg by mouth daily. We discussed the importance of controlling her hypertension in the setting of HIV given the increased risk for cardiovascular complications. We therefore decided to add atenolol 25 mg by mouth daily to her antihypertensive regimen. We will recheck the blood pressure at the followup visit and titrate up as appropriate.

## 2013-06-20 NOTE — Assessment & Plan Note (Signed)
She is otherwise up-to-date on her preventative healthcare maintenance.

## 2013-06-20 NOTE — Assessment & Plan Note (Signed)
She notes frequency, with dysuria over the last several days. This is consistent with previous urinary tract infections. Given the symptomatic nature of the infection we decided to treat with Bactrim double strength one tablet by mouth twice a day for 5 days. A prescription was sent to her pharmacy so she may start therapy immediately. We will reassess her symptoms at the followup visit.

## 2013-06-20 NOTE — Assessment & Plan Note (Signed)
She is followed closely at the [facility] for Infectious Diseases by Dr. Tommy Medal. She states she's been compliant with her Atripla. I will defer further management of her HIV to Dr. Tommy Medal.

## 2013-06-20 NOTE — Assessment & Plan Note (Signed)
At the last clinic visit her LDL was found to be 57, which is well within target. This is while taking lovastatin 40 mg by mouth daily. She is tolerating this medication well without myalgias. We will therefore continue the lovastatin at 40 mg by mouth daily.

## 2013-06-20 NOTE — Assessment & Plan Note (Signed)
Her anxiety is well controlled on the Xanax 1 mg by mouth every 8 hours as needed for anxiety. This medication has also been prescribed on a long-term basis by Dr. Orene Desanctis. Dr. Tommy Medal asked that I assume the responsibility of prescribing this medication. I was more than happy to do so and the controlled substance contract was signed. We will continue the Xanax at 1 mg by mouth every 8 hours as needed for anxiety.

## 2013-06-20 NOTE — Assessment & Plan Note (Signed)
She was unable to quit smoking despite the Chantix. She is beating herself up over this failure. We discussed how addictive nicotine is, and the fact that it usually takes several attempts at smoking cessation before success is achieved. We decided we would give her a break from the concept of tobacco cessation as she currently is not ready to give it another go. We will readdress the issue at the followup visit. She was satisfied with this plan.

## 2013-06-20 NOTE — Progress Notes (Signed)
   Subjective:    Patient ID: Jessica [name], female    DOB: [birth year], 73 y.o.   MRN: [id number]  HPI  Please see the A&P for the status of the pt's chronic medical problems.  Review of Systems  Constitutional: Negative for activity change, appetite change and unexpected weight change.  HENT: Negative for congestion, facial swelling, postnasal drip, rhinorrhea and sinus pressure.   Cardiovascular: Negative for leg swelling.  Gastrointestinal: Positive for constipation. Negative for nausea, vomiting, abdominal pain and diarrhea.  Genitourinary: Positive for dysuria and frequency.  Musculoskeletal: Positive for back pain and myalgias. Negative for joint swelling.  Skin: Negative for color change, rash and wound.  Allergic/Immunologic: Negative for environmental allergies.  Neurological: Negative for weakness and headaches.      Objective:   Physical Exam  Nursing note and vitals reviewed. Constitutional: She is oriented to person, place, and time. She appears well-developed and well-nourished. No distress.  HENT:  Head: Normocephalic and atraumatic.  Eyes: Conjunctivae are normal. Right eye exhibits no discharge. Left eye exhibits no discharge. No scleral icterus.  Cardiovascular: Normal rate, regular rhythm and normal heart sounds.  Exam reveals no gallop and no friction rub.   No murmur heard. Pulmonary/Chest: Effort normal and breath sounds normal. No respiratory distress. She has no wheezes. She has no rales.  Abdominal: Soft. Bowel sounds are normal. She exhibits no distension. There is no tenderness. There is no rebound and no guarding.  Musculoskeletal: Normal range of motion. She exhibits no edema and no tenderness.  Neurological: She is alert and oriented to person, place, and time. She exhibits normal muscle tone.  Skin: Skin is warm and dry. She is not diaphoretic. No erythema.  Psychiatric: She has a normal mood and affect. Her behavior is normal. Judgment and  thought content normal.      Assessment & Plan:   Please see problem oriented charting.

## 2013-06-20 NOTE — Assessment & Plan Note (Signed)
She asked whether or not she needed to continue the alendronate 70 mg by mouth weekly. We discussed the results of her DEXA scan that was obtained in February 2014 that demonstrated osteoporosis. It was my recommendation that she continue the alendronate as well as the calcium and vitamin D. After next February, we will repeat the DEXA scan to make sure that we have stabilization, or possible improvement, in her bone density on this therapy. We are waiting until that time as that is the 2 year Medicare rule.

## 2013-06-20 NOTE — Assessment & Plan Note (Signed)
At the last clinic visit she was noted to have a very superficial ulcer on her foot. She was referred to podiatry and they felt this was secondary to a fungal infection. She was given an oral antifungal with complete resolution of this lesion.

## 2013-06-20 NOTE — Patient Instructions (Signed)
It was good to see you again.  I think you are doing a wonderful job taking care of yourself.  1) Keep taking all of your medications as you have been doing.  2) You have a urinary tract infection and I started Bactrim DS 1 tablet by mouth twice daily.  3) I added atenolol 25 mg by mouth once daily for your blood pressure.  4) I provided you with 3 monthly prescriptions for your Narco, and fentanyl after we signed the pain contract.  5) I have taken over the prescribing of your alprazolam.  I will see you back in 3 months, sooner if necessary.

## 2013-06-30 ENCOUNTER — Other Ambulatory Visit: Payer: Self-pay | Admitting: Infectious Disease

## 2013-06-30 ENCOUNTER — Other Ambulatory Visit: Payer: Self-pay | Admitting: Internal Medicine

## 2013-06-30 DIAGNOSIS — M5136 Other intervertebral disc degeneration, lumbar region: Secondary | ICD-10-CM

## 2013-06-30 DIAGNOSIS — I10 Essential (primary) hypertension: Secondary | ICD-10-CM

## 2013-07-07 DIAGNOSIS — H4010X0 Unspecified open-angle glaucoma, stage unspecified: Secondary | ICD-10-CM | POA: Diagnosis not present

## 2013-07-09 ENCOUNTER — Other Ambulatory Visit: Payer: Self-pay | Admitting: *Deleted

## 2013-07-09 DIAGNOSIS — B20 Human immunodeficiency virus [HIV] disease: Secondary | ICD-10-CM

## 2013-07-09 MED ORDER — ENSURE PO LIQD: 237.0000 mL | Freq: Two times a day (BID) | ORAL | Status: DC

## 2013-07-27 ENCOUNTER — Emergency Department (HOSPITAL_COMMUNITY): Payer: Medicare Other

## 2013-07-27 ENCOUNTER — Emergency Department (HOSPITAL_COMMUNITY)
Admission: EM | Admit: 2013-07-27 | Discharge: 2013-07-27 | Disposition: A | Discharge status: Home / Self Care | Payer: Medicare Other | Attending: Emergency Medicine | Admitting: Emergency Medicine

## 2013-07-27 ENCOUNTER — Encounter (HOSPITAL_COMMUNITY): Payer: Self-pay | Admitting: Emergency Medicine

## 2013-07-27 DIAGNOSIS — E876 Hypokalemia: Secondary | ICD-10-CM | POA: Diagnosis not present

## 2013-07-27 DIAGNOSIS — Z21 Asymptomatic human immunodeficiency virus [HIV] infection status: Secondary | ICD-10-CM | POA: Insufficient documentation

## 2013-07-27 DIAGNOSIS — Z7982 Long term (current) use of aspirin: Secondary | ICD-10-CM | POA: Diagnosis not present

## 2013-07-27 DIAGNOSIS — Z8719 Personal history of other diseases of the digestive system: Secondary | ICD-10-CM | POA: Diagnosis not present

## 2013-07-27 DIAGNOSIS — R404 Transient alteration of awareness: Secondary | ICD-10-CM | POA: Diagnosis not present

## 2013-07-27 DIAGNOSIS — E785 Hyperlipidemia, unspecified: Secondary | ICD-10-CM | POA: Insufficient documentation

## 2013-07-27 DIAGNOSIS — R011 Cardiac murmur, unspecified: Secondary | ICD-10-CM | POA: Insufficient documentation

## 2013-07-27 DIAGNOSIS — Z79899 Other long term (current) drug therapy: Secondary | ICD-10-CM | POA: Diagnosis not present

## 2013-07-27 DIAGNOSIS — B20 Human immunodeficiency virus [HIV] disease: Secondary | ICD-10-CM

## 2013-07-27 DIAGNOSIS — R55 Syncope and collapse: Secondary | ICD-10-CM | POA: Insufficient documentation

## 2013-07-27 DIAGNOSIS — I10 Essential (primary) hypertension: Secondary | ICD-10-CM | POA: Insufficient documentation

## 2013-07-27 DIAGNOSIS — F17200 Nicotine dependence, unspecified, uncomplicated: Secondary | ICD-10-CM | POA: Insufficient documentation

## 2013-07-27 DIAGNOSIS — M810 Age-related osteoporosis without current pathological fracture: Secondary | ICD-10-CM | POA: Diagnosis not present

## 2013-07-27 DIAGNOSIS — IMO0002 Reserved for concepts with insufficient information to code with codable children: Secondary | ICD-10-CM | POA: Diagnosis not present

## 2013-07-27 DIAGNOSIS — Z8619 Personal history of other infectious and parasitic diseases: Secondary | ICD-10-CM | POA: Diagnosis not present

## 2013-07-27 DIAGNOSIS — Z8669 Personal history of other diseases of the nervous system and sense organs: Secondary | ICD-10-CM | POA: Diagnosis not present

## 2013-07-27 DIAGNOSIS — Z872 Personal history of diseases of the skin and subcutaneous tissue: Secondary | ICD-10-CM | POA: Diagnosis not present

## 2013-07-27 DIAGNOSIS — F411 Generalized anxiety disorder: Secondary | ICD-10-CM | POA: Diagnosis not present

## 2013-07-27 DIAGNOSIS — R42 Dizziness and giddiness: Secondary | ICD-10-CM | POA: Diagnosis not present

## 2013-07-27 DIAGNOSIS — T1490XA Injury, unspecified, initial encounter: Secondary | ICD-10-CM | POA: Diagnosis not present

## 2013-07-27 DIAGNOSIS — S060X9A Concussion with loss of consciousness of unspecified duration, initial encounter: Secondary | ICD-10-CM | POA: Diagnosis not present

## 2013-07-27 LAB — CBC WITH DIFFERENTIAL/PLATELET
BASOS ABS: 0.1 10*3/uL (ref 0.0–0.1)
BASOS PCT: 0 % (ref 0–1)
Eosinophils Absolute: 0.3 10*3/uL (ref 0.0–0.7)
Eosinophils Relative: 2 % (ref 0–5)
HCT: 38.3 % (ref 36.0–46.0)
HEMOGLOBIN: 13.6 g/dL (ref 12.0–15.0)
Lymphocytes Relative: 24 % (ref 12–46)
Lymphs Abs: 3.2 10*3/uL (ref 0.7–4.0)
MCH: 32.9 pg (ref 26.0–34.0)
MCHC: 35.5 g/dL (ref 30.0–36.0)
MCV: 92.7 fL (ref 78.0–100.0)
MONOS PCT: 5 % (ref 3–12)
Monocytes Absolute: 0.6 10*3/uL (ref 0.1–1.0)
NEUTROS ABS: 9.1 10*3/uL — AB (ref 1.7–7.7)
Neutrophils Relative %: 69 % (ref 43–77)
PLATELETS: 347 10*3/uL (ref 150–400)
RBC: 4.13 MIL/uL (ref 3.87–5.11)
RDW: 14.3 % (ref 11.5–15.5)
WBC: 13.2 10*3/uL — ABNORMAL HIGH (ref 4.0–10.5)

## 2013-07-27 LAB — BASIC METABOLIC PANEL
BUN: 8 mg/dL (ref 6–23)
CHLORIDE: 108 meq/L (ref 96–112)
CO2: 16 mEq/L — ABNORMAL LOW (ref 19–32)
Calcium: 9.4 mg/dL (ref 8.4–10.5)
Creatinine, Ser: 0.76 mg/dL (ref 0.50–1.10)
GFR calc Af Amer: >90 mL/min (ref >90)
GFR, EST NON AFRICAN AMERICAN: 83 mL/min — AB (ref >90)
Glucose, Bld: 99 mg/dL (ref 70–99)
POTASSIUM: 2.5 meq/L — AB (ref 3.7–5.3)
SODIUM: 140 meq/L (ref 137–147)

## 2013-07-27 MED ORDER — POTASSIUM CHLORIDE CRYS ER 20 MEQ PO TBCR: 20.0000 meq | EXTENDED_RELEASE_TABLET | Freq: Two times a day (BID) | ORAL | Status: DC

## 2013-07-27 MED ORDER — POTASSIUM CHLORIDE 20 MEQ PO PACK: 40.0000 meq | PACK | Freq: Once | ORAL | Status: DC

## 2013-07-27 MED ORDER — POTASSIUM CHLORIDE CRYS ER 20 MEQ PO TBCR
40.0000 meq | EXTENDED_RELEASE_TABLET | Freq: Once | ORAL | Status: AC
  Administered 2013-07-27: 40 meq via ORAL
  Filled 2013-07-27: qty 2

## 2013-07-27 MED ORDER — SODIUM CHLORIDE 0.9 % IV BOLUS (SEPSIS)
1000.0000 mL | Freq: Once | INTRAVENOUS | Status: AC
  Administered 2013-07-27: 1000 mL via INTRAVENOUS

## 2013-07-27 NOTE — ED Provider Notes (Signed)
CSN: [id number]     Arrival date & time 07/27/13  1129 History  This chart was scribed for Nat Christen, MD by Rolanda Lundborg, ED Scribe. This patient was seen in room APA11/APA11 and the patient's care was started at 12:43 PM.    Chief Complaint  Patient presents with  . Loss of Consciousness   The history is provided by the patient. No language interpreter was used.   HPI Comments: Jessica [name] is a 73 y.o. female who presents to the Emergency Department complaining of a syncopal episode that occurred today while walking from the bedroom to the kitchen. She states she fell and hit her head on the floor. States she was "knocked out" and spontaneously recovered. She is now alert without any neuro deficits. Per family, this has happened approximated 3 other times in the past and is usually associated with a BM. She denies CP, SOB, or extremity pain from the fall.    Past Medical History  Diagnosis Date  . Human immunodeficiency virus disease 03/27/1986  . Tobacco abuse 02/19/2012  . Essential hypertension 07/20/2006  . Hyperlipidemia LDL goal < 100 04/05/2012  . 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 Lumbar degenerative disc disease 07/20/2006    With chronic back pain   . 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 Small bowel obstruction due to adhesions 02/08/2012    s/p Exploratory laparotomy, lysis of adhesions 02/12/12    . Constipation due to pain medication 04/27/2010  . Diverticulosis 02/08/2012    Extensive left-sided diverticula on colonoscopy March 2012 per Dr. Gala Romney   . Genital herpes 07/20/2006  . 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 Seborrhea 09/01/2010  . Anxiety 04/05/2012  . Bursitis of right shoulder 07/12/2012    s/p shoulder injection 07/12/2012   . Voiding dysfunction     s/p cystoscopy and meatal dilation Dec 2005  . Cataract of right eye   . Heart murmur 1961  . Blood  transfusion without reported diagnosis   . Right rotator cuff tear 02/01/2013    Responds to periodic steroid injections   Past Surgical History  Procedure Laterality Date  . Eye surgery    . Aorto-femoral bypass graft  04/2007  . Appendectomy    . Cholecystectomy    . Colectomy  01/2011    Dr. Margot Chimes; "took out 12 inches of small intestiines and removed blockage"  . Abdominal hysterectomy    . Laparotomy  02/12/2012    Procedure: EXPLORATORY LAPAROTOMY;  Surgeon: Stark Klein, MD;  Location: [facility] OR;  Service: General;  Laterality: N/A;  Exploratory Laparotomy, lysis of adhesions  . Breast surgery      Breast biopsy: negative  . Small intestine surgery     Family History  Problem Relation Age of Onset  . Kidney failure Mother   . Diabetes Mother   . Hypertension Mother   . Heart disease Mother   . Glaucoma Father   . Congestive Heart Failure Sister   . Diabetes Sister   . Kidney disease Sister   . Diabetes Brother   . Unexplained death Brother 63    Automobile accident  . Hypothyroidism Daughter   . Arthritis Daughter     Neck/Back  . Healthy Son   . HIV/AIDS Brother   . HIV Daughter   . Kidney disease Daughter   . Arthritis Son  Knee   History  Substance Use Topics  . Smoking status: Current Every Day Smoker -- 0.10 packs/day for 50 years    Types: Cigars  . Smokeless tobacco: Never Used     Comment: using chantix- smokes black and mild cigars  . Alcohol Use: No     Comment: "last drink of alcohol ~ 1977"   OB History   Grav Para Term Preterm Abortions TAB SAB Ect Mult Living   6 4 4  2  2   4      Review of Systems  Respiratory: Negative for shortness of breath.   Cardiovascular: Negative for chest pain.  Neurological: Positive for syncope.   A complete 10 system review of systems was obtained and all systems are negative except as noted in the HPI and PMH.     Allergies  Review of patient's allergies indicates no known allergies.  Home Medications    Prior to Admission medications   Medication Sig Start Date End Date Taking? Authorizing Provider  alendronate (FOSAMAX) 70 MG tablet Take 1 tablet (70 mg total) by mouth once a week. On an empty stomach with 8 ounces of water 06/20/13  Yes Karren Cobble, MD  ALPRAZolam Duanne Moron) 1 MG tablet Take 1 tablet (1 mg total) by mouth 3 (three) times daily as needed for anxiety. 06/20/13  Yes Karren Cobble, MD  amLODipine (NORVASC) 10 MG tablet Take 1 tablet (10 mg total) by mouth daily. 05/29/13  Yes Karren Cobble, MD  aspirin 81 MG EC tablet Take 81 mg by mouth daily.     Yes Historical Provider, MD  atenolol (TENORMIN) 25 MG tablet Take 1 tablet (25 mg total) by mouth daily. 06/20/13  Yes Karren Cobble, MD  benazepril (LOTENSIN) 40 MG tablet TAKE 1 TABLET BY MOUTH DAILY. 12/05/12  Yes Truman Hayward, MD  Calcium Carbonate Antacid (ROLAIDS EXTRA STRENGTH PO) Take 1 tablet by mouth as needed (acid reflux).    Yes Historical Provider, MD  calcium-vitamin D (OSCAL WITH D) 500-200 MG-UNIT per tablet Take 1 tablet by mouth 2 (two) times daily.   Yes Historical Provider, MD  COMBIGAN 0.2-0.5 % ophthalmic solution Place 1 drop into the left eye 2 (two) times daily. 01/06/13  Yes Historical Provider, MD  cyclobenzaprine (FLEXERIL) 10 MG tablet Take 1 tablet (10 mg total) by mouth 3 (three) times daily as needed for muscle spasms.   Yes Karren Cobble, MD  docusate sodium (COLACE) 100 MG capsule Take 100 mg by mouth 2 (two) times daily.   Yes Historical Provider, MD  efavirenz-emtricitabine-tenofovir (ATRIPLA) 600-200-300 MG per tablet Take 1 tablet by mouth at bedtime. 04/02/13  Yes Truman Hayward, MD  ENSURE (ENSURE) Take 237 mLs by mouth 2 (two) times daily. 07/09/13 07/09/14 Yes Truman Hayward, MD  fentaNYL (DURAGESIC) 50 MCG/HR Place 1 patch (50 mcg total) onto the skin every 3 (three) days. 06/20/13 07/27/13 Yes Karren Cobble, MD  hydrochlorothiazide (HYDRODIURIL) 25 MG tablet Take 1  tablet (25 mg total) by mouth daily. 06/30/13  Yes Karren Cobble, MD  HYDROcodone-acetaminophen [facility]) 10-325 MG per tablet Take 1 tablet by mouth 3 (three) times daily as needed for severe pain. 06/20/13  Yes Karren Cobble, MD  ibuprofen (ADVIL,MOTRIN) 200 MG tablet Take 400 mg by mouth 3 (three) times daily as needed for pain.   Yes Historical Provider, MD  lovastatin (MEVACOR) 40 MG tablet TAKE 1 TABLET BY MOUTH AT BEDTIME  06/30/13  Yes Truman Hayward, MD  megestrol (MEGACE) 40 MG tablet TAKE 2 TABLETS BY MOUTH TWICE DAILY 05/30/13  Yes Truman Hayward, MD  Multiple Vitamin (MULTIVITAMIN WITH MINERALS) TABS Take 1 tablet by mouth daily.   Yes Historical Provider, MD  sorbitol 70 % SOLN Take 15 mLs by mouth daily as needed (mix with coffee, juice, water). 10/08/12  Yes Rebecca Eaton, MD  triamcinolone ointment (KENALOG) 0.1 % APPLY 1 APPLICATION TWICE DAILY 01/06/13  Yes Campbell Riches, MD   BP 162/85  Pulse 89  Temp(Src) 97.8 F (36.6 C) (Oral)  Ht 5\' 7"  (1.702 m)  Wt 148 lb (67.132 kg)  BMI 23.17 kg/m2  SpO2 100% Physical Exam  Nursing note and vitals reviewed. Constitutional: She is oriented to person, place, and time. She appears well-developed and well-nourished.  HENT:  Head: Normocephalic and atraumatic.  Eyes: Conjunctivae and EOM are normal. Pupils are equal, round, and reactive to light.  Neck: Normal range of motion. Neck supple.  Cardiovascular: Normal rate, regular rhythm and normal heart sounds.   Pulmonary/Chest: Effort normal and breath sounds normal.  Abdominal: Soft. Bowel sounds are normal.  Musculoskeletal: Normal range of motion.  Neurological: She is alert and oriented to person, place, and time.  Skin: Skin is warm and dry.  Psychiatric: She has a normal mood and affect. Her behavior is normal.    ED Course  Procedures (including critical care time) Medications  sodium chloride 0.9 % bolus 1,000 mL (1,000 mLs Intravenous New Bag/Given  07/27/13 1238)  potassium chloride SA (K-DUR,KLOR-CON) CR tablet 40 mEq (40 mEq Oral Given 07/27/13 1403)    DIAGNOSTIC STUDIES: Oxygen Saturation is 100% on RA, normal by my interpretation.    COORDINATION OF CARE: 12:46 PM- Discussed treatment plan with pt which includes EKG, blood work, CT head, and IV fluids. Pt agrees to plan.    Results for orders placed during the hospital encounter of 07/27/13  CBC WITH DIFFERENTIAL      Result Value Ref Range   WBC 13.2 (*) 4.0 - 10.5 K/uL   RBC 4.13  3.87 - 5.11 MIL/uL   Hemoglobin 13.6  12.0 - 15.0 g/dL   HCT 38.3  36.0 - 46.0 %   MCV 92.7  78.0 - 100.0 fL   MCH 32.9  26.0 - 34.0 pg   MCHC 35.5  30.0 - 36.0 g/dL   RDW 14.3  11.5 - 15.5 %   Platelets 347  150 - 400 K/uL   Neutrophils Relative % 69  43 - 77 %   Neutro Abs 9.1 (*) 1.7 - 7.7 K/uL   Lymphocytes Relative 24  12 - 46 %   Lymphs Abs 3.2  0.7 - 4.0 K/uL   Monocytes Relative 5  3 - 12 %   Monocytes Absolute 0.6  0.1 - 1.0 K/uL   Eosinophils Relative 2  0 - 5 %   Eosinophils Absolute 0.3  0.0 - 0.7 K/uL   Basophils Relative 0  0 - 1 %   Basophils Absolute 0.1  0.0 - 0.1 K/uL  BASIC METABOLIC PANEL      Result Value Ref Range   Sodium 140  137 - 147 mEq/L   Potassium 2.5 (*) 3.7 - 5.3 mEq/L   Chloride 108  96 - 112 mEq/L   CO2 16 (*) 19 - 32 mEq/L   Glucose, Bld 99  70 - 99 mg/dL   BUN 8  6 - 23 mg/dL  Creatinine, Ser 0.76  0.50 - 1.10 mg/dL   Calcium 9.4  8.4 - 10.5 mg/dL   GFR calc non Af Amer 83 (*) >90 mL/min   GFR calc Af Amer >90  >90 mL/min   Ct Head Wo Contrast  07/27/2013   CLINICAL DATA:  Loss of consciousness status post fall.  EXAM: CT HEAD WITHOUT CONTRAST  TECHNIQUE: Contiguous axial images were obtained from the base of the skull through the vertex without intravenous contrast.  COMPARISON:  DG BONE DENSITY dated 04/15/2012; CT C SPINE W/O CM dated 04/08/2009; MR HEAD WO/W CM dated 07/23/2011; CT HEAD W/O CM dated 04/08/2009  FINDINGS: There is no intra or  extra-axial fluid collection or mass lesion. The basilar cisterns and ventricles have a normal appearance. There is no CT evidence for acute infarction or hemorrhage. Orbits are unremarkable. Calvarium is intact.  IMPRESSION: No acute intracranial process.   Electronically Signed   By: Lovey Newcomer M.D.   On: 07/27/2013 14:05      EKG Interpretation   Date/Time:  Sunday Jul 27 2013 11:46:20 EDT Ventricular Rate:  87 PR Interval:  131 QRS Duration: 94 QT Interval:  448 QTC Calculation: 539 R Axis:   21 Text Interpretation:  Sinus rhythm Probable LVH with secondary repol abnrm  Anterior Q waves, possibly due to LVH Prolonged QT interval Confirmed by  Lacinda Axon  MD, Meko Bellanger ([id number]) on 07/27/2013 12:45:36 PM      MDM   Final diagnoses:  Syncope  Hypokalemia  HIV (human immunodeficiency virus infection)    Patient is hemodynamically stable. She reports 3 syncopal spells in the past. Potassium is low. She is alert without neurological deficits. EKG normal. CT head negative. Will replace potassium. She has primary care followup at [facility] internal medicine clinic  I personally performed the services described in this documentation, which was scribed in my presence. The recorded information has been reviewed and is accurate.    Nat Christen, MD 07/27/13 1504

## 2013-07-27 NOTE — Discharge Instructions (Signed)
Increase fluids. Your potassium is low. You will need oral replacement. Take another dose tonight. Followup with your primary care Dr. this week. CT scan of head was normal.

## 2013-07-27 NOTE — ED Notes (Signed)
CRITICAL VALUE ALERT  Critical value received:  Potassium  Date of notification:  07/27/13  Time of notification:  1305  Critical value read back:yes  Nurse who received alert:  Jeanice Lim, RN  MD notified (1st page):  Dr Lacinda Axon.  Time of first page:  1305

## 2013-07-27 NOTE — ED Notes (Signed)
Patient with no complaints at this time. Respirations even and unlabored. Skin warm/dry. Discharge instructions reviewed with patient at this time. Patient given opportunity to voice concerns/ask questions. IV removed per policy and band-aid applied to site. Patient discharged at this time and left Emergency Department with steady gait.  

## 2013-07-27 NOTE — ED Notes (Signed)
Patient with c/o loss of consciousness. Patient states she got up out of bed, got dizzy, fell and hit head. States several minute long unconsciousness. Found by EMS diaphoretic, but alert.

## 2013-08-06 ENCOUNTER — Encounter: Payer: Self-pay | Admitting: Internal Medicine

## 2013-08-06 ENCOUNTER — Ambulatory Visit: Payer: Medicare Other | Admitting: Internal Medicine

## 2013-08-06 ENCOUNTER — Ambulatory Visit (INDEPENDENT_AMBULATORY_CARE_PROVIDER_SITE_OTHER): Payer: Medicare Other | Admitting: Internal Medicine

## 2013-08-06 VITALS — BP 150/80 | HR 70 | Temp 99.2°F | Ht 68.0 in | Wt 148.6 lb

## 2013-08-06 DIAGNOSIS — B20 Human immunodeficiency virus [HIV] disease: Secondary | ICD-10-CM | POA: Diagnosis not present

## 2013-08-06 DIAGNOSIS — I10 Essential (primary) hypertension: Secondary | ICD-10-CM | POA: Diagnosis not present

## 2013-08-06 DIAGNOSIS — Z8639 Personal history of other endocrine, nutritional and metabolic disease: Secondary | ICD-10-CM

## 2013-08-06 DIAGNOSIS — Z862 Personal history of diseases of the blood and blood-forming organs and certain disorders involving the immune mechanism: Secondary | ICD-10-CM | POA: Diagnosis not present

## 2013-08-06 MED ORDER — POTASSIUM CHLORIDE CRYS ER 20 MEQ PO TBCR: 20.0000 meq | EXTENDED_RELEASE_TABLET | Freq: Two times a day (BID) | ORAL | Status: DC

## 2013-08-06 NOTE — Assessment & Plan Note (Addendum)
BP Readings from Last 3 Encounters:  08/06/13 150/80  07/27/13 116/59  06/20/13 164/93    Lab Results  Component Value Date   NA 140 07/27/2013   K 2.5* 07/27/2013   CREATININE 0.76 07/27/2013    Assessment: Blood pressure control: controlled Progress toward BP goal:  at goal Comments: Manual BP 145/70 performed by me. Patient with lightheadedness, ringing in ears, falls which she attributes to HCTZ (experienced similar symptoms before). Atenolol recently started on 06/20/13 and also could be a contributing factor. No falls since self-discontinuing HCTZ.  Plan: Medications:    Stop HCTZ 25 mg by mouth daily. Continue amlodipine 10 mg by mouth daily, Benzapril 40 mg by mouth daily, atenolol 25 mg by mouth daily. Other plans: Explained that atenolol can also have a side effect of bradycardia which can cause the symptoms she described. I instructed the patient to use her blood pressure cuff to measure her blood pressure/ heart rate if she becomes lightheaded again.  - Return in one month for blood pressure check

## 2013-08-06 NOTE — Assessment & Plan Note (Addendum)
  Potassium  Date Value Ref Range Status  07/27/2013 2.5* 3.7 - 5.3 mEq/L Final     CRITICAL RESULT CALLED TO, READ BACK BY AND VERIFIED WITH:     OSBORNE T. AT 1306 ON [id number] BY THOMPSON S.  03/19/2013 3.5  3.5 - 5.3 mEq/L Final  10/08/2012 3.4* 3.5 - 5.1 mEq/L Final  10/07/2012 3.8  3.5 - 5.1 mEq/L Final  09/26/2012 4.3  3.5 - 5.3 mEq/L Final    Likely secondary to HCTZ (which she has discontinued) combined with low oral intake. - Will check BMP today - Reordered K-Dur 20 mEq twice a day #60 - Instructed the patient to continue to hold her HCTZ  ADDENDUM: K wnl. Low bicarb has been present on recent BMPs. No anion gap. Continue to monitor.  BMET    Component Value Date/Time   NA 140 08/06/2013 1030   K 4.6 08/06/2013 1030   CL 115* 08/06/2013 1030   CO2 17* 08/06/2013 1030   GLUCOSE 108* 08/06/2013 1030   BUN 14 08/06/2013 1030   CREATININE 0.82 08/06/2013 1030   CREATININE 0.76 07/27/2013 1229   CALCIUM 9.9 08/06/2013 1030   GFRNONAA 73 08/06/2013 1030   GFRNONAA 83* 07/27/2013 1229   GFRAA 84 08/06/2013 1030   GFRAA >90 07/27/2013 1229

## 2013-08-06 NOTE — Progress Notes (Signed)
INTERNAL MEDICINE TEACHING ATTENDING ADDENDUM - Antwian Santaana, MD: I reviewed and discussed with the resident Dr. Cater, the patient's medical history, physical examination, diagnosis and results of pertinent tests and treatment and I agree with the patient's care as documented. 

## 2013-08-06 NOTE — Patient Instructions (Addendum)
Thank you for your visit. - Please continue to hold your HCTZ. I have removed it from your med list for now. - I have reordered your potassium supplementation. Please take one pill twice a day. - I will check your blood levels of potassium here. I will call you if it is too low or too high and we need to change your dose of oral supplementation. - If you again become lightheaded, dizzy, and hear ringing in your ears, please use your cuff to take your blood pressure and heart rate. - Return to the clinic in one month for followup of your blood pressure.

## 2013-08-06 NOTE — Progress Notes (Signed)
Subjective:    Patient ID: Jessica [name], female    DOB: Aug 25, 1940, 73 y.o.   MRN: [id number]  HPI  Jessica [name] is a 73 y.o. female PMH HIV, HTN, lumbar DDD, tobacco abuse, anxiety, osteoporosis, HLD, here for ER follow up.  Patient was seen in the ER on 07/27/2013 after a syncopal episode that occurred while walking from the bedroom to the kitchen. She says she felt like the room was spinning, heard a ringing in her ears, and fell and hit her head on the counter. She was evaluated and without any neurologic deficits. CT head and EKG were within normal limits. She was noted to be hypokalemic to 2.5, and potassium was replaced. She was prescribed potassium oral supplementation K-dur 20 twice a day, but the ED doctor only gave her #20 so she has run out.  She associates the "wooziness ", ringing in her ears, lightheadedness with HCTZ. She says she previously took this medicine as part of a combination blood pressure pill, and had the same type of feeling. It was discontinued for a time, but restarted last year in an attempt to better control her high blood pressure.   She self-discontinued the HCTZ after her fall, and reports no syncopal episodes since that time. She occasionally feels lightheaded, but it is less severe. She is compliant with the rest of her medications and brings all of her meds bottles to her visit today.   Current Outpatient Prescriptions on File Prior to Visit  Medication Sig Dispense Refill  . alendronate (FOSAMAX) 70 MG tablet Take 1 tablet (70 mg total) by mouth once a week. On an empty stomach with 8 ounces of water  12 tablet  3  . ALPRAZolam (XANAX) 1 MG tablet Take 1 tablet (1 mg total) by mouth 3 (three) times daily as needed for anxiety.  90 tablet  5  . amLODipine (NORVASC) 10 MG tablet Take 1 tablet (10 mg total) by mouth daily.  90 tablet  3  . aspirin 81 MG EC tablet Take 81 mg by mouth daily.        Marland Kitchen atenolol (TENORMIN) 25 MG tablet Take  1 tablet (25 mg total) by mouth daily.  90 tablet  3  . benazepril (LOTENSIN) 40 MG tablet TAKE 1 TABLET BY MOUTH DAILY.  30 tablet  11  . Calcium Carbonate Antacid (ROLAIDS EXTRA STRENGTH PO) Take 1 tablet by mouth as needed (acid reflux).       . calcium-vitamin D (OSCAL WITH D) 500-200 MG-UNIT per tablet Take 1 tablet by mouth 2 (two) times daily.      . COMBIGAN 0.2-0.5 % ophthalmic solution Place 1 drop into the left eye 2 (two) times daily.      . cyclobenzaprine (FLEXERIL) 10 MG tablet Take 1 tablet (10 mg total) by mouth 3 (three) times daily as needed for muscle spasms.  90 tablet  11  . docusate sodium (COLACE) 100 MG capsule Take 100 mg by mouth 2 (two) times daily.      Marland Kitchen efavirenz-emtricitabine-tenofovir (ATRIPLA) 600-200-300 MG per tablet Take 1 tablet by mouth at bedtime.  30 tablet  0  . ENSURE (ENSURE) Take 237 mLs by mouth 2 (two) times daily.  237 mL  11  . fentaNYL (DURAGESIC) 50 MCG/HR Place 1 patch (50 mcg total) onto the skin every 3 (three) days.  10 patch  0  . hydrochlorothiazide (HYDRODIURIL) 25 MG tablet Take 1 tablet (25 mg total) by mouth daily.  90 tablet  3  . HYDROcodone-acetaminophen (NORCO) 10-325 MG per tablet Take 1 tablet by mouth 3 (three) times daily as needed for severe pain.  60 tablet  0  . ibuprofen (ADVIL,MOTRIN) 200 MG tablet Take 400 mg by mouth 3 (three) times daily as needed for pain.      Marland Kitchen lovastatin (MEVACOR) 40 MG tablet TAKE 1 TABLET BY MOUTH AT BEDTIME  30 tablet  5  . megestrol (MEGACE) 40 MG tablet TAKE 2 TABLETS BY MOUTH TWICE DAILY  120 tablet  3  . Multiple Vitamin (MULTIVITAMIN WITH MINERALS) TABS Take 1 tablet by mouth daily.      . potassium chloride SA (K-DUR,KLOR-CON) 20 MEQ tablet Take 1 tablet (20 mEq total) by mouth 2 (two) times daily.  20 tablet  0  . sorbitol 70 % SOLN Take 15 mLs by mouth daily as needed (mix with coffee, juice, water).  500 mL  1  . triamcinolone ointment (KENALOG) 0.1 % APPLY 1 APPLICATION TWICE DAILY  454 g   30    Review of Systems  Constitutional: Negative for fever and chills.  Respiratory: Negative for shortness of breath.   Cardiovascular: Negative for chest pain.  Neurological: Negative for dizziness, syncope, weakness and numbness.  Psychiatric/Behavioral: Negative for confusion.      Objective:   Physical Exam  Constitutional: She is oriented to person, place, and time. She appears well-developed and well-nourished.  Very pleasant  HENT:  Head: Normocephalic and atraumatic.  Eyes: Conjunctivae and EOM are normal. Pupils are equal, round, and reactive to light.  Neck: Normal range of motion. Neck supple.  Cardiovascular: Normal rate and regular rhythm.  Exam reveals no gallop and no friction rub.   No murmur heard. Pulmonary/Chest: Effort normal and breath sounds normal.  Musculoskeletal: Normal range of motion. She exhibits no edema and no tenderness.  Neurological: She is alert and oriented to person, place, and time. No cranial nerve deficit.  Skin: Skin is warm and dry.  Psychiatric: She has a normal mood and affect.    Filed Vitals:   08/06/13 1003  BP: 150/80  Pulse: 70  Temp: 99.2 F (37.3 C)   Manual BP 145/70 performed by me.     Assessment & Plan:   Please see problem based charting

## 2013-08-07 LAB — BASIC METABOLIC PANEL WITH GFR
BUN: 14 mg/dL (ref 6–23)
CHLORIDE: 115 meq/L — AB (ref 96–112)
CO2: 17 mEq/L — ABNORMAL LOW (ref 19–32)
Calcium: 9.9 mg/dL (ref 8.4–10.5)
Creat: 0.82 mg/dL (ref 0.50–1.10)
GFR, EST NON AFRICAN AMERICAN: 73 mL/min
GFR, Est African American: 84 mL/min
Glucose, Bld: 108 mg/dL — ABNORMAL HIGH (ref 70–99)
POTASSIUM: 4.6 meq/L (ref 3.5–5.3)
SODIUM: 140 meq/L (ref 135–145)

## 2013-08-15 ENCOUNTER — Other Ambulatory Visit: Payer: Self-pay | Admitting: *Deleted

## 2013-08-15 DIAGNOSIS — B20 Human immunodeficiency virus [HIV] disease: Secondary | ICD-10-CM

## 2013-08-15 MED ORDER — EFAVIRENZ-EMTRICITAB-TENOFOVIR 600-200-300 MG PO TABS: 1.0000 | ORAL_TABLET | Freq: Every day | ORAL | Status: DC

## 2013-08-18 ENCOUNTER — Other Ambulatory Visit: Payer: Self-pay | Admitting: *Deleted

## 2013-08-18 DIAGNOSIS — B20 Human immunodeficiency virus [HIV] disease: Secondary | ICD-10-CM

## 2013-08-18 MED ORDER — EFAVIRENZ-EMTRICITAB-TENOFOVIR 600-200-300 MG PO TABS: 1.0000 | ORAL_TABLET | Freq: Every day | ORAL | Status: DC

## 2013-08-25 ENCOUNTER — Ambulatory Visit (INDEPENDENT_AMBULATORY_CARE_PROVIDER_SITE_OTHER): Payer: Medicare Other | Admitting: Infectious Disease

## 2013-08-25 ENCOUNTER — Encounter: Payer: Self-pay | Admitting: Infectious Disease

## 2013-08-25 ENCOUNTER — Other Ambulatory Visit (HOSPITAL_COMMUNITY)
Admission: RE | Admit: 2013-08-25 | Discharge: 2013-08-25 | Disposition: A | Discharge status: Home / Self Care | Payer: Medicare Other | Source: Ambulatory Visit | Attending: Infectious Disease | Admitting: Infectious Disease

## 2013-08-25 VITALS — BP 171/90 | HR 71 | Temp 98.5°F | Wt 142.0 lb

## 2013-08-25 DIAGNOSIS — I10 Essential (primary) hypertension: Secondary | ICD-10-CM

## 2013-08-25 DIAGNOSIS — M5137 Other intervertebral disc degeneration, lumbosacral region: Secondary | ICD-10-CM | POA: Diagnosis not present

## 2013-08-25 DIAGNOSIS — N390 Urinary tract infection, site not specified: Secondary | ICD-10-CM | POA: Diagnosis not present

## 2013-08-25 DIAGNOSIS — F17200 Nicotine dependence, unspecified, uncomplicated: Secondary | ICD-10-CM | POA: Diagnosis not present

## 2013-08-25 DIAGNOSIS — Z113 Encounter for screening for infections with a predominantly sexual mode of transmission: Secondary | ICD-10-CM | POA: Insufficient documentation

## 2013-08-25 DIAGNOSIS — Z862 Personal history of diseases of the blood and blood-forming organs and certain disorders involving the immune mechanism: Secondary | ICD-10-CM | POA: Diagnosis not present

## 2013-08-25 DIAGNOSIS — M810 Age-related osteoporosis without current pathological fracture: Secondary | ICD-10-CM | POA: Diagnosis not present

## 2013-08-25 DIAGNOSIS — E785 Hyperlipidemia, unspecified: Secondary | ICD-10-CM | POA: Diagnosis not present

## 2013-08-25 DIAGNOSIS — F411 Generalized anxiety disorder: Secondary | ICD-10-CM | POA: Diagnosis not present

## 2013-08-25 DIAGNOSIS — B20 Human immunodeficiency virus [HIV] disease: Secondary | ICD-10-CM

## 2013-08-25 DIAGNOSIS — J329 Chronic sinusitis, unspecified: Secondary | ICD-10-CM | POA: Diagnosis not present

## 2013-08-25 LAB — LIPID PANEL
Cholesterol: 127 mg/dL (ref 0–200)
HDL: 47 mg/dL (ref >39)
LDL CALC: 60 mg/dL (ref 0–99)
TRIGLYCERIDES: 100 mg/dL (ref <150)
Total CHOL/HDL Ratio: 2.7 Ratio
VLDL: 20 mg/dL (ref 0–40)

## 2013-08-25 LAB — COMPLETE METABOLIC PANEL WITH GFR
ALBUMIN: 4.6 g/dL (ref 3.5–5.2)
ALK PHOS: 53 U/L (ref 39–117)
ALT: 14 U/L (ref 0–35)
AST: 19 U/L (ref 0–37)
BUN: 13 mg/dL (ref 6–23)
CALCIUM: 10.4 mg/dL (ref 8.4–10.5)
CHLORIDE: 113 meq/L — AB (ref 96–112)
CO2: 16 mEq/L — ABNORMAL LOW (ref 19–32)
Creat: 0.86 mg/dL (ref 0.50–1.10)
GFR, Est African American: 79 mL/min
GFR, Est Non African American: 68 mL/min
Glucose, Bld: 86 mg/dL (ref 70–99)
Potassium: 4.4 mEq/L (ref 3.5–5.3)
SODIUM: 138 meq/L (ref 135–145)
Total Bilirubin: 0.3 mg/dL (ref 0.2–1.2)
Total Protein: 8.1 g/dL (ref 6.0–8.3)

## 2013-08-25 MED ORDER — VARENICLINE TARTRATE 0.5 MG X 11 & 1 MG X 42 PO MISC: ORAL | Status: DC

## 2013-08-25 MED ORDER — VARENICLINE TARTRATE 1 MG PO TABS: 1.0000 mg | ORAL_TABLET | Freq: Two times a day (BID) | ORAL | Status: DC

## 2013-08-25 NOTE — Progress Notes (Signed)
  Subjective:    Patient ID: Jessica [name], female    DOB: 21-Sep-1940, 73 y.o.   MRN: [id number]  HPI   Jessica [name] is a 73 y.o. female with HIV infection who is doing superbly well on her antiviral regimen, Atripla with undetectable viral load and health cd4 count.   She does have PVD and is seen by Cardiology, [facility] having undergon bypass surgery and seen by Dr Eppie Gibson in [facility] clinic   She is still smoking tobacco but approximately a pack every 3 days she is open to being back on medicine for this and I've sent a prescription for Chantix in.     Review of Systems  Constitutional: Negative for fever, chills, diaphoresis, activity change, appetite change, fatigue and unexpected weight change.  HENT: Negative for congestion, ear pain, rhinorrhea, sinus pressure, sneezing, sore throat and trouble swallowing.   Eyes: Negative for photophobia and visual disturbance.  Respiratory: Negative for cough, chest tightness, shortness of breath, wheezing and stridor.   Cardiovascular: Negative for chest pain, palpitations and leg swelling.  Gastrointestinal: Negative for nausea, vomiting, abdominal pain, diarrhea, constipation, blood in stool, abdominal distention and anal bleeding.  Genitourinary: Negative for dysuria, hematuria, flank pain and difficulty urinating.  Musculoskeletal: Negative for arthralgias, back pain, gait problem, joint swelling and myalgias.  Skin: Negative for color change, pallor and wound.  Neurological: Negative for dizziness, tremors, weakness and light-headedness.  Hematological: Negative for adenopathy. Does not bruise/bleed easily.  Psychiatric/Behavioral: Negative for behavioral problems, confusion, sleep disturbance, dysphoric mood, decreased concentration and agitation.       Objective:   Physical Exam  Constitutional: She is oriented to person, place, and time. She appears well-developed and well-nourished. No distress.  HENT:  Head:  Normocephalic and atraumatic.  Mouth/Throat: Oropharynx is clear and moist. No oropharyngeal exudate.  Eyes: Conjunctivae and EOM are normal. Pupils are equal, round, and reactive to light. No scleral icterus.  Neck: Normal range of motion. Neck supple. No JVD present.  Cardiovascular: Normal rate, regular rhythm and normal heart sounds.  Exam reveals no gallop and no friction rub.   No murmur heard. Pulmonary/Chest: Effort normal and breath sounds normal. No respiratory distress. She has no wheezes. She has no rales. She exhibits no tenderness.  Abdominal: She exhibits no distension and no mass. There is no tenderness. There is no rebound and no guarding.  Musculoskeletal: She exhibits no edema and no tenderness.  Lymphadenopathy:    She has no cervical adenopathy.  Neurological: She is alert and oriented to person, place, and time. She exhibits normal muscle tone. Coordination normal.  Skin: Skin is warm and dry. She is not diaphoretic. No erythema. No pallor.  Psychiatric: She has a normal mood and affect. Her behavior is normal. Judgment and thought content normal.          Assessment & Plan:   HIV: Perfect control continue current antiretroviral regimen. SHE NEEDS SPAP renewal for Sept thru March  HTN:fu with Dr. Eppie Gibson  Smoking: > 3 minutes spent counseling the patient to stop smoking

## 2013-08-26 LAB — HIV-1 RNA QUANT-NO REFLEX-BLD: HIV-1 RNA Quant, Log: <1.3 {Log} (ref <1.30)

## 2013-08-26 LAB — CBC WITH DIFFERENTIAL/PLATELET
BASOS ABS: 0.1 10*3/uL (ref 0.0–0.1)
Basophils Relative: 1 % (ref 0–1)
EOS ABS: 0.2 10*3/uL (ref 0.0–0.7)
EOS PCT: 2 % (ref 0–5)
HEMATOCRIT: 37.7 % (ref 36.0–46.0)
Hemoglobin: 13.2 g/dL (ref 12.0–15.0)
LYMPHS PCT: 65 % — AB (ref 12–46)
Lymphs Abs: 6.2 10*3/uL — ABNORMAL HIGH (ref 0.7–4.0)
MCH: 33.2 pg (ref 26.0–34.0)
MCHC: 35 g/dL (ref 30.0–36.0)
MCV: 94.7 fL (ref 78.0–100.0)
MONO ABS: 0.4 10*3/uL (ref 0.1–1.0)
Monocytes Relative: 4 % (ref 3–12)
Neutro Abs: 2.7 10*3/uL (ref 1.7–7.7)
Neutrophils Relative %: 28 % — ABNORMAL LOW (ref 43–77)
PLATELETS: 404 10*3/uL — AB (ref 150–400)
RBC: 3.98 MIL/uL (ref 3.87–5.11)
RDW: 14 % (ref 11.5–15.5)
WBC: 9.5 10*3/uL (ref 4.0–10.5)

## 2013-08-26 LAB — T-HELPER CELL (CD4) - (RCID CLINIC ONLY)
CD4 % Helper T Cell: 41 % (ref 33–55)
CD4 T CELL ABS: 2390 /uL (ref 400–2700)

## 2013-08-26 LAB — RPR

## 2013-08-26 LAB — PATHOLOGIST SMEAR REVIEW

## 2013-09-05 ENCOUNTER — Encounter: Payer: Self-pay | Admitting: Internal Medicine

## 2013-09-05 ENCOUNTER — Ambulatory Visit (HOSPITAL_COMMUNITY)
Admission: RE | Admit: 2013-09-05 | Discharge: 2013-09-05 | Disposition: A | Discharge status: Home / Self Care | Payer: Medicare Other | Source: Ambulatory Visit | Attending: Oncology | Admitting: Oncology

## 2013-09-05 ENCOUNTER — Ambulatory Visit (INDEPENDENT_AMBULATORY_CARE_PROVIDER_SITE_OTHER): Payer: Medicare Other | Admitting: Internal Medicine

## 2013-09-05 ENCOUNTER — Observation Stay (HOSPITAL_COMMUNITY)
Admission: AD | Admit: 2013-09-05 | Discharge: 2013-09-09 | Disposition: A | Discharge status: Home / Self Care | Payer: Medicare Other | Source: Ambulatory Visit | Attending: Internal Medicine | Admitting: Internal Medicine

## 2013-09-05 VITALS — BP 141/78 | HR 76 | Temp 97.1°F | Ht 68.0 in | Wt 139.5 lb

## 2013-09-05 DIAGNOSIS — R109 Unspecified abdominal pain: Secondary | ICD-10-CM | POA: Diagnosis not present

## 2013-09-05 DIAGNOSIS — Z720 Tobacco use: Secondary | ICD-10-CM

## 2013-09-05 DIAGNOSIS — R1032 Left lower quadrant pain: Secondary | ICD-10-CM | POA: Diagnosis present

## 2013-09-05 DIAGNOSIS — Z79899 Other long term (current) drug therapy: Secondary | ICD-10-CM | POA: Diagnosis not present

## 2013-09-05 DIAGNOSIS — R011 Cardiac murmur, unspecified: Secondary | ICD-10-CM | POA: Diagnosis not present

## 2013-09-05 DIAGNOSIS — B20 Human immunodeficiency virus [HIV] disease: Secondary | ICD-10-CM | POA: Diagnosis present

## 2013-09-05 DIAGNOSIS — I739 Peripheral vascular disease, unspecified: Secondary | ICD-10-CM | POA: Insufficient documentation

## 2013-09-05 DIAGNOSIS — K5730 Diverticulosis of large intestine without perforation or abscess without bleeding: Secondary | ICD-10-CM | POA: Diagnosis present

## 2013-09-05 DIAGNOSIS — H269 Unspecified cataract: Secondary | ICD-10-CM | POA: Diagnosis not present

## 2013-09-05 DIAGNOSIS — I10 Essential (primary) hypertension: Secondary | ICD-10-CM | POA: Insufficient documentation

## 2013-09-05 DIAGNOSIS — K2980 Duodenitis without bleeding: Secondary | ICD-10-CM | POA: Diagnosis not present

## 2013-09-05 DIAGNOSIS — F419 Anxiety disorder, unspecified: Secondary | ICD-10-CM

## 2013-09-05 DIAGNOSIS — M67919 Unspecified disorder of synovium and tendon, unspecified shoulder: Secondary | ICD-10-CM | POA: Diagnosis not present

## 2013-09-05 DIAGNOSIS — F17200 Nicotine dependence, unspecified, uncomplicated: Secondary | ICD-10-CM | POA: Diagnosis present

## 2013-09-05 DIAGNOSIS — K56609 Unspecified intestinal obstruction, unspecified as to partial versus complete obstruction: Secondary | ICD-10-CM | POA: Diagnosis present

## 2013-09-05 DIAGNOSIS — M5136 Other intervertebral disc degeneration, lumbar region: Secondary | ICD-10-CM | POA: Diagnosis present

## 2013-09-05 DIAGNOSIS — Z21 Asymptomatic human immunodeficiency virus [HIV] infection status: Secondary | ICD-10-CM | POA: Diagnosis not present

## 2013-09-05 DIAGNOSIS — M51379 Other intervertebral disc degeneration, lumbosacral region without mention of lumbar back pain or lower extremity pain: Secondary | ICD-10-CM | POA: Diagnosis not present

## 2013-09-05 DIAGNOSIS — K5903 Drug induced constipation: Secondary | ICD-10-CM | POA: Diagnosis present

## 2013-09-05 DIAGNOSIS — H409 Unspecified glaucoma: Secondary | ICD-10-CM | POA: Insufficient documentation

## 2013-09-05 DIAGNOSIS — E872 Acidosis, unspecified: Secondary | ICD-10-CM | POA: Insufficient documentation

## 2013-09-05 DIAGNOSIS — E8729 Other acidosis: Secondary | ICD-10-CM | POA: Diagnosis present

## 2013-09-05 DIAGNOSIS — M810 Age-related osteoporosis without current pathological fracture: Secondary | ICD-10-CM | POA: Diagnosis not present

## 2013-09-05 DIAGNOSIS — F411 Generalized anxiety disorder: Secondary | ICD-10-CM | POA: Insufficient documentation

## 2013-09-05 DIAGNOSIS — L989 Disorder of the skin and subcutaneous tissue, unspecified: Secondary | ICD-10-CM | POA: Diagnosis not present

## 2013-09-05 DIAGNOSIS — M5137 Other intervertebral disc degeneration, lumbosacral region: Secondary | ICD-10-CM | POA: Diagnosis not present

## 2013-09-05 DIAGNOSIS — R0982 Postnasal drip: Secondary | ICD-10-CM | POA: Diagnosis not present

## 2013-09-05 DIAGNOSIS — Z7982 Long term (current) use of aspirin: Secondary | ICD-10-CM | POA: Diagnosis not present

## 2013-09-05 DIAGNOSIS — K5900 Constipation, unspecified: Secondary | ICD-10-CM | POA: Diagnosis not present

## 2013-09-05 DIAGNOSIS — K565 Intestinal adhesions [bands], unspecified as to partial versus complete obstruction: Secondary | ICD-10-CM | POA: Diagnosis not present

## 2013-09-05 DIAGNOSIS — G8929 Other chronic pain: Secondary | ICD-10-CM | POA: Insufficient documentation

## 2013-09-05 DIAGNOSIS — M51369 Other intervertebral disc degeneration, lumbar region without mention of lumbar back pain or lower extremity pain: Secondary | ICD-10-CM | POA: Diagnosis present

## 2013-09-05 LAB — CBC WITH DIFFERENTIAL/PLATELET
BASOS PCT: 0 % (ref 0–1)
Basophils Absolute: 0 10*3/uL (ref 0.0–0.1)
Eosinophils Absolute: 0.2 10*3/uL (ref 0.0–0.7)
Eosinophils Relative: 2 % (ref 0–5)
HCT: 39.6 % (ref 36.0–46.0)
HEMOGLOBIN: 13.9 g/dL (ref 12.0–15.0)
LYMPHS ABS: 5.1 10*3/uL — AB (ref 0.7–4.0)
Lymphocytes Relative: 58 % — ABNORMAL HIGH (ref 12–46)
MCH: 33.6 pg (ref 26.0–34.0)
MCHC: 35.1 g/dL (ref 30.0–36.0)
MCV: 95.7 fL (ref 78.0–100.0)
Monocytes Absolute: 0.4 10*3/uL (ref 0.1–1.0)
Monocytes Relative: 4 % (ref 3–12)
NEUTROS PCT: 36 % — AB (ref 43–77)
Neutro Abs: 3.2 10*3/uL (ref 1.7–7.7)
Platelets: 366 10*3/uL (ref 150–400)
RBC: 4.14 MIL/uL (ref 3.87–5.11)
RDW: 14 % (ref 11.5–15.5)
WBC: 8.8 10*3/uL (ref 4.0–10.5)

## 2013-09-05 LAB — LACTIC ACID, PLASMA: Lactic Acid, Venous: 0.7 mmol/L (ref 0.5–2.2)

## 2013-09-05 LAB — COMPLETE METABOLIC PANEL WITH GFR
ALK PHOS: 61 U/L (ref 39–117)
ALT: 12 U/L (ref 0–35)
AST: 19 U/L (ref 0–37)
Albumin: 4.4 g/dL (ref 3.5–5.2)
BILIRUBIN TOTAL: 0.2 mg/dL — AB (ref 0.2–1.2)
BUN: 9 mg/dL (ref 6–23)
CO2: 13 mEq/L — ABNORMAL LOW (ref 19–32)
CREATININE: 0.68 mg/dL (ref 0.50–1.10)
Calcium: 10.7 mg/dL — ABNORMAL HIGH (ref 8.4–10.5)
Chloride: 104 mEq/L (ref 96–112)
GFR, EST NON AFRICAN AMERICAN: 88 mL/min
GLUCOSE: 92 mg/dL (ref 70–99)
Potassium: 4.1 mEq/L (ref 3.5–5.3)
Sodium: 139 mEq/L (ref 135–145)
Total Protein: 9.2 g/dL — ABNORMAL HIGH (ref 6.0–8.3)

## 2013-09-05 LAB — AMYLASE: AMYLASE: 74 U/L (ref 0–105)

## 2013-09-05 LAB — LIPASE: Lipase: 22 U/L (ref 0–75)

## 2013-09-05 MED ORDER — ATENOLOL 25 MG PO TABS: 25.0000 mg | ORAL_TABLET | Freq: Every day | ORAL | Status: DC

## 2013-09-05 MED ORDER — AMLODIPINE BESYLATE 10 MG PO TABS
10.0000 mg | ORAL_TABLET | Freq: Every day | ORAL | Status: DC
  Administered 2013-09-05 – 2013-09-09 (×5): 10 mg via ORAL
  Filled 2013-09-05 (×5): qty 1

## 2013-09-05 MED ORDER — DEXTROSE-NACL 5-0.9 % IV SOLN
INTRAVENOUS | Status: DC
  Administered 2013-09-05 – 2013-09-07 (×5): via INTRAVENOUS

## 2013-09-05 MED ORDER — ENSURE COMPLETE PO LIQD
237.0000 mL | Freq: Two times a day (BID) | ORAL | Status: DC
  Administered 2013-09-07 – 2013-09-09 (×5): 237 mL via ORAL

## 2013-09-05 MED ORDER — ASPIRIN EC 81 MG PO TBEC
81.0000 mg | DELAYED_RELEASE_TABLET | Freq: Every day | ORAL | Status: DC
  Administered 2013-09-06 – 2013-09-09 (×4): 81 mg via ORAL
  Filled 2013-09-05 (×4): qty 1

## 2013-09-05 MED ORDER — FENTANYL 50 MCG/HR TD PT72
50.0000 ug | MEDICATED_PATCH | TRANSDERMAL | Status: DC
  Administered 2013-09-07: 50 ug via TRANSDERMAL
  Filled 2013-09-05: qty 1

## 2013-09-05 MED ORDER — ALPRAZOLAM 0.5 MG PO TABS
1.0000 mg | ORAL_TABLET | Freq: Three times a day (TID) | ORAL | Status: DC | PRN
  Administered 2013-09-05 – 2013-09-08 (×7): 1 mg via ORAL
  Filled 2013-09-05 (×7): qty 2

## 2013-09-05 MED ORDER — TIMOLOL MALEATE 0.5 % OP SOLN
1.0000 [drp] | Freq: Two times a day (BID) | OPHTHALMIC | Status: DC
  Administered 2013-09-05 – 2013-09-09 (×9): 1 [drp] via OPHTHALMIC
  Filled 2013-09-05 (×2): qty 5

## 2013-09-05 MED ORDER — HYDROCODONE-ACETAMINOPHEN 10-325 MG PO TABS
1.0000 | ORAL_TABLET | Freq: Three times a day (TID) | ORAL | Status: DC | PRN
  Administered 2013-09-05 – 2013-09-09 (×10): 1 via ORAL
  Filled 2013-09-05 (×10): qty 1

## 2013-09-05 MED ORDER — BRIMONIDINE TARTRATE 0.2 % OP SOLN
1.0000 [drp] | Freq: Two times a day (BID) | OPHTHALMIC | Status: DC
  Administered 2013-09-05 – 2013-09-09 (×9): 1 [drp] via OPHTHALMIC
  Filled 2013-09-05 (×2): qty 5

## 2013-09-05 MED ORDER — CYCLOBENZAPRINE HCL 10 MG PO TABS
10.0000 mg | ORAL_TABLET | Freq: Three times a day (TID) | ORAL | Status: DC | PRN
  Administered 2013-09-06 – 2013-09-08 (×7): 10 mg via ORAL
  Filled 2013-09-05 (×7): qty 1

## 2013-09-05 MED ORDER — ATENOLOL 25 MG PO TABS
25.0000 mg | ORAL_TABLET | Freq: Every day | ORAL | Status: DC
  Administered 2013-09-05 – 2013-09-09 (×5): 25 mg via ORAL
  Filled 2013-09-05 (×5): qty 1

## 2013-09-05 MED ORDER — EFAVIRENZ-EMTRICITAB-TENOFOVIR 600-200-300 MG PO TABS
1.0000 | ORAL_TABLET | Freq: Every day | ORAL | Status: DC
  Administered 2013-09-05 – 2013-09-08 (×4): 1 via ORAL
  Filled 2013-09-05 (×6): qty 1

## 2013-09-05 MED ORDER — ALENDRONATE SODIUM 70 MG PO TABS: 70.0000 mg | ORAL_TABLET | ORAL | Status: DC

## 2013-09-05 MED ORDER — AMLODIPINE BESYLATE 10 MG PO TABS: 10.0000 mg | ORAL_TABLET | Freq: Every day | ORAL | Status: DC

## 2013-09-05 MED ORDER — ENOXAPARIN SODIUM 40 MG/0.4ML ~~LOC~~ SOLN
40.0000 mg | SUBCUTANEOUS | Status: DC
  Administered 2013-09-05 – 2013-09-08 (×4): 40 mg via SUBCUTANEOUS
  Filled 2013-09-05 (×5): qty 0.4

## 2013-09-05 MED ORDER — MEGESTROL ACETATE 20 MG PO TABS
20.0000 mg | ORAL_TABLET | Freq: Two times a day (BID) | ORAL | Status: DC
  Administered 2013-09-05 – 2013-09-09 (×8): 20 mg via ORAL
  Filled 2013-09-05 (×9): qty 1

## 2013-09-05 MED ORDER — BRIMONIDINE TARTRATE-TIMOLOL 0.2-0.5 % OP SOLN
1.0000 [drp] | Freq: Two times a day (BID) | OPHTHALMIC | Status: DC
  Filled 2013-09-05: qty 5

## 2013-09-05 MED ORDER — ADULT MULTIVITAMIN W/MINERALS CH
1.0000 | ORAL_TABLET | Freq: Every day | ORAL | Status: DC
  Administered 2013-09-06 – 2013-09-09 (×4): 1 via ORAL
  Filled 2013-09-05 (×4): qty 1

## 2013-09-05 MED ORDER — ASPIRIN 81 MG PO TBEC: 81.0000 mg | DELAYED_RELEASE_TABLET | Freq: Every day | ORAL | Status: DC

## 2013-09-05 MED ORDER — POTASSIUM CHLORIDE CRYS ER 20 MEQ PO TBCR
20.0000 meq | EXTENDED_RELEASE_TABLET | Freq: Two times a day (BID) | ORAL | Status: AC
  Administered 2013-09-05 – 2013-09-06 (×3): 20 meq via ORAL
  Filled 2013-09-05 (×3): qty 1

## 2013-09-05 MED ORDER — SORBITOL 70 % SOLN
15.0000 mL | Freq: Every day | Status: DC | PRN
  Filled 2013-09-05: qty 30

## 2013-09-05 MED ORDER — DOCUSATE SODIUM 100 MG PO CAPS
100.0000 mg | ORAL_CAPSULE | Freq: Two times a day (BID) | ORAL | Status: DC
  Administered 2013-09-05 – 2013-09-09 (×8): 100 mg via ORAL
  Filled 2013-09-05 (×8): qty 1

## 2013-09-05 MED ORDER — CALCIUM CARBONATE-VITAMIN D 500-200 MG-UNIT PO TABS
1.0000 | ORAL_TABLET | Freq: Two times a day (BID) | ORAL | Status: DC
  Administered 2013-09-05 – 2013-09-09 (×8): 1 via ORAL
  Filled 2013-09-05 (×10): qty 1

## 2013-09-05 MED ORDER — SIMVASTATIN 20 MG PO TABS
20.0000 mg | ORAL_TABLET | Freq: Every day | ORAL | Status: DC
  Administered 2013-09-05 – 2013-09-08 (×4): 20 mg via ORAL
  Filled 2013-09-05 (×2): qty 1
  Filled 2013-09-05: qty 2
  Filled 2013-09-05 (×3): qty 1

## 2013-09-05 MED ORDER — VARENICLINE TARTRATE 1 MG PO TABS
1.0000 mg | ORAL_TABLET | Freq: Two times a day (BID) | ORAL | Status: DC
  Administered 2013-09-05: 1 mg via ORAL
  Filled 2013-09-05 (×3): qty 1

## 2013-09-05 MED ORDER — ENSURE PO LIQD: 237.0000 mL | Freq: Two times a day (BID) | ORAL | Status: DC

## 2013-09-05 NOTE — Progress Notes (Signed)
Attending physician note: Presenting problems, physical findings, medications, discussed with resident physician Dr. Jerene Pitch and I concur with her management. Murriel Hopper, M.D., [place]

## 2013-09-05 NOTE — Assessment & Plan Note (Addendum)
Worsening abdominal pain x1 week despite BM with laxatives yesterday. Severe, left sided and epigastric region with nausea, and rebound tenderness and hypoactive bowel sounds. Concern for possible recurrence vs. Diverticulitis (although no fever or chills, but does have hx of significant diverticulosis). She lives in [place] and limited in transportation.   -stat abd xray -cbc, cmet, lipase, amylase -admit to med surge for IMTS--likely will need bowel rest, pain control (but narcotics could be contributing to this issue), and fluids  Patient seen and case discussed with Dr. Beryle Beams

## 2013-09-05 NOTE — Progress Notes (Signed)
Subjective:   Patient ID: Jessica [name] female   DOB: 20-Sep-1940 73 y.o.   MRN: [id number]  HPI: Jessica [name] is a 73 y.o. African American female with HIV, HTN, HL, SBO s/p ex lap and lysis of adhesions 02/2012 and other PMH as listed below presenting to opc today for BP and potassium follow up visit.   HTN--improved since prior visit. She is on amlodipine, benazapril, and atenolol. She has stopped HCTZ. No more dizziness or syncope.   Abdominal pain--hx of diverticulosis on colonoscopy and recurrent SBO's and ex lap x2 with lysis of adhesions 2012 and 2013. Mainly LLQ abdominal pain but radiating to diffuse abdomen, very tender to palpation, worsening x1 week, nausea. No vomiting, passing gas, and has large BM yesterday after lots of laxatives and after three days. On hydrocodone and has been taking more frequently due to pain. Did not pass gas today. Denies any bloody BM, dysuria, or frequency. Pain improved with narcotics, worse with movement, constant and occasionally intermittent.   Past Medical History  Diagnosis Date  . Human immunodeficiency virus disease 03/27/1986  . Tobacco abuse 02/19/2012  . Essential hypertension 07/20/2006  . Hyperlipidemia LDL goal < 100 04/05/2012  . 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 Lumbar degenerative disc disease 07/20/2006    With chronic back pain   . 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 Small bowel obstruction due to adhesions 02/08/2012    s/p Exploratory laparotomy, lysis of adhesions 02/12/12    . Constipation due to pain medication 04/27/2010  . Diverticulosis 02/08/2012    Extensive left-sided diverticula on colonoscopy March 2012 per Dr. Gala Romney   . Genital herpes 07/20/2006  . 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 Seborrhea 09/01/2010  . Anxiety 04/05/2012  . Bursitis of right shoulder 07/12/2012    s/p shoulder injection  07/12/2012   . Voiding dysfunction     s/p cystoscopy and meatal dilation Dec 2005  . Cataract of right eye   . Heart murmur 1961  . Blood transfusion without reported diagnosis   . Right rotator cuff tear 02/01/2013    Responds to periodic steroid injections   Current Outpatient Prescriptions  Medication Sig Dispense Refill  . alendronate (FOSAMAX) 70 MG tablet Take 1 tablet (70 mg total) by mouth once a week. On an empty stomach with 8 ounces of water  12 tablet  3  . ALPRAZolam (XANAX) 1 MG tablet Take 1 tablet (1 mg total) by mouth 3 (three) times daily as needed for anxiety.  90 tablet  5  . amLODipine (NORVASC) 10 MG tablet Take 1 tablet (10 mg total) by mouth daily.  90 tablet  3  . aspirin 81 MG EC tablet Take 81 mg by mouth daily.        Marland Kitchen atenolol (TENORMIN) 25 MG tablet Take 1 tablet (25 mg total) by mouth daily.  90 tablet  3  . benazepril (LOTENSIN) 40 MG tablet TAKE 1 TABLET BY MOUTH DAILY.  30 tablet  11  . Calcium Carbonate Antacid (ROLAIDS EXTRA STRENGTH PO) Take 1 tablet by mouth as needed (acid reflux).       . calcium-vitamin D (OSCAL WITH D) 500-200 MG-UNIT per tablet Take 1 tablet by mouth 2 (two) times daily.      . COMBIGAN 0.2-0.5 % ophthalmic solution Place 1 drop into the left eye 2 (  two) times daily.      . cyclobenzaprine (FLEXERIL) 10 MG tablet Take 1 tablet (10 mg total) by mouth 3 (three) times daily as needed for muscle spasms.  90 tablet  11  . docusate sodium (COLACE) 100 MG capsule Take 100 mg by mouth 2 (two) times daily.      Marland Kitchen efavirenz-emtricitabine-tenofovir (ATRIPLA) 600-200-300 MG per tablet Take 1 tablet by mouth at bedtime.  30 tablet  2  . ENSURE (ENSURE) Take 237 mLs by mouth 2 (two) times daily.  237 mL  11  . fentaNYL (DURAGESIC) 50 MCG/HR Place 1 patch (50 mcg total) onto the skin every 3 (three) days.  10 patch  0  . HYDROcodone-acetaminophen (NORCO) 10-325 MG per tablet Take 1 tablet by mouth 3 (three) times daily as needed for severe pain.   60 tablet  0  . ibuprofen (ADVIL,MOTRIN) 200 MG tablet Take 400 mg by mouth 3 (three) times daily as needed for pain.      Marland Kitchen lovastatin (MEVACOR) 40 MG tablet TAKE 1 TABLET BY MOUTH AT BEDTIME  30 tablet  5  . megestrol (MEGACE) 40 MG tablet TAKE 2 TABLETS BY MOUTH TWICE DAILY  120 tablet  3  . Multiple Vitamin (MULTIVITAMIN WITH MINERALS) TABS Take 1 tablet by mouth daily.      . potassium chloride SA (K-DUR,KLOR-CON) 20 MEQ tablet Take 1 tablet (20 mEq total) by mouth 2 (two) times daily.  60 tablet  1  . sorbitol 70 % SOLN Take 15 mLs by mouth daily as needed (mix with coffee, juice, water).  500 mL  1  . triamcinolone ointment (KENALOG) 0.1 % APPLY 1 APPLICATION TWICE DAILY  454 g  30  . varenicline (CHANTIX CONTINUING MONTH PAK) 1 MG tablet Take 1 tablet (1 mg total) by mouth 2 (two) times daily.  60 tablet  1  . varenicline (CHANTIX STARTING MONTH PAK) 0.5 MG X 11 & 1 MG X 42 tablet Take one 0.5 mg tablet by mouth once daily for 3 days, then increase to one 0.5 mg tablet twice daily for 4 days, then increase to one 1 mg tablet twice daily.  53 tablet  0   No current facility-administered medications for this visit.   Family History  Problem Relation Age of Onset  . Kidney failure Mother   . Diabetes Mother   . Hypertension Mother   . Heart disease Mother   . Glaucoma Father   . Congestive Heart Failure Sister   . Diabetes Sister   . Kidney disease Sister   . Diabetes Brother   . Unexplained death Brother 54    Automobile accident  . Hypothyroidism Daughter   . Arthritis Daughter     Neck/Back  . Healthy Son   . HIV/AIDS Brother   . HIV Daughter   . Kidney disease Daughter   . Arthritis Son     Knee   History   Social History  . Marital Status: Widowed    Spouse Name: N/A    Number of Children: 31  . Years of Education: 2y college   Occupational History  . retired     previously worked as a Designer, fashion/clothing for [employer] History Main Topics  . Smoking status:  Current Every Day Smoker -- 0.10 packs/day for 50 years    Types: Cigars  . Smokeless tobacco: Never Used     Comment: using chantix- smokes black and mild cigars  . Alcohol Use:  No     Comment: "last drink of alcohol ~ 1977"  . Drug Use: No  . Sexual Activity: No   Other Topics Concern  . None   Social History Narrative   Lives in [place], [place] with daughter and nephew   Review of Systems:  Constitutional:  Denies fever, chills.   HEENT:  Denies congestion   Respiratory:  Denies SOB  Cardiovascular:  Denies chest pain  Gastrointestinal:  Nausea and abdominal pain  Genitourinary:  Denies dysuria. Hx of incontinence  Musculoskeletal:  Chronic back and leg pain  Skin:  Denies pallor, rash and wound.   Neurological:  Denies dizziness and syncope   Objective:  Physical Exam: Filed Vitals:   09/05/13 1006  BP: 141/78  Pulse: 76  Temp: 97.1 F (36.2 C)  TempSrc: Oral  Height: 5\' 8"  (1.727 m)  Weight: 139 lb 8 oz (63.277 kg)  SpO2: 99%   Vitals reviewed. General: sitting in chair, acute distress on examination table HEENT: EOMI Cardiac: RRR Pulm: clear to auscultation bilaterally Abd: soft, very hypoactive bowel sounds, midline surgical scars, tender to light palpation left side of abdomen, worse LLQ and epigastric region, +rebound tenderness, -cva tenderness Ext: warm and well perfused, no pedal edema, +2DP B/L Neuro: alert and oriented X3, strength and sensation grossly intact  Assessment & Plan:  Discussed with Dr. Beryle Beams Admit to IMTS for further work up and possible recurrent SBO

## 2013-09-05 NOTE — Addendum Note (Signed)
Addended by: Ebbie Latus on: 09/05/2013 11:44 AM   Modules accepted: Orders

## 2013-09-05 NOTE — Progress Notes (Signed)
Paged MD to notify of patient arrival.  Will continue to monitor and await orders. Syliva Overman

## 2013-09-05 NOTE — H&P (Signed)
Date: 09/05/2013               Patient Name:  Jessica [name] MRN: [id number]  DOB: 19-Jan-1941 Age / Sex: 73 y.o., female   PCP: Karren Cobble, MD              Medical Service: Internal Medicine Teaching Service              Attending Physician: Dr. Sid Falcon, MD    First Contact: Dr. Gordy Levan  Pager: [phone]  Second Contact: Dr. Alice Rieger Pager: [phone]            After Hours (After 5p/  First Contact Pager: [phone]  weekends / holidays): Second Contact Pager: [phone]   Chief Complaint: Abdominal pain  History of Present Illness: The patient is a 73 yo AA woman with h/o HIV (last CD4 2390 08/2013), HTN, PVD, chronic constipation, diverticulosis, DDD, two prior SBOs w/ multiple abd surgeries who presents to clinic with c/o LLQ pain for a week. She describes that pain as sharp, 8/10, constant, nonradiating, without nausea, vomiting, or diarrhea. She reports, that she constantly needs to take stool softeners, including Dulcolax and sorbitol. Her last bowel movement was 1 day prior to admission and this was followed by several days of constipation. She denies any constitutional symptoms, like fevers, chills, or increased fatigue. She was admitted for further evaluation for concern of SBO. Her most recent admission was on 10/07/2012 for similar symptoms. She responded well to a combination of Senokot, soapsuds enema and sorbitol and discharged after one day with resolution of her symptoms.  Review of Systems: Constitutional: Denies fever, chills, diaphoresis, appetite change and fatigue.  HEENT: Denies photophobia, eye pain, redness, hearing loss, ear pain, congestion, sore throat, rhinorrhea, sneezing, mouth sores, trouble swallowing, neck pain, neck stiffness and tinnitus.  Respiratory: Denies SOB, DOE, cough, chest tightness, and wheezing.  Cardiovascular: Denies chest pain, palpitations and leg swelling.  Genitourinary: Denies dysuria, urgency, frequency, hematuria, flank  pain and difficulty urinating.  Musculoskeletal: Patient has chronic pain involving her entire back due to degenerative disc disease. She is on chronic opiates. Denies myalgias, joint swelling, arthralgias and gait problem.  Skin: Denies pallor, rash and wound.  Neurological: Denies dizziness, seizures, syncope, weakness, lightheadedness, numbness and headaches.  Hematological: Denies adenopathy. Easy bruising, personal or family bleeding history  Psychiatric/Behavioral: Denies suicidal ideation, mood changes, confusion, nervousness, sleep disturbance and agitation  Meds: Medications Prior to Admission  Medication Sig Dispense Refill  . acyclovir (ZOVIRAX) 400 MG tablet Take 400 mg by mouth 2 (two) times daily as needed (out breaks).      Marland Kitchen alendronate (FOSAMAX) 70 MG tablet Take 1 tablet (70 mg total) by mouth once a week. On an empty stomach with 8 ounces of water  12 tablet  3  . ALPRAZolam (XANAX) 1 MG tablet Take 1 tablet (1 mg total) by mouth 3 (three) times daily as needed for anxiety.  90 tablet  5  . amLODipine (NORVASC) 10 MG tablet Take 1 tablet (10 mg total) by mouth daily.  90 tablet  3  . aspirin 81 MG EC tablet Take 81 mg by mouth daily.        Marland Kitchen atenolol (TENORMIN) 25 MG tablet Take 1 tablet (25 mg total) by mouth daily.  90 tablet  3  . benazepril (LOTENSIN) 40 MG tablet Take 40 mg by mouth daily.      . Calcium Carbonate Antacid (ROLAIDS EXTRA STRENGTH PO) Take 1  tablet by mouth as needed (acid reflux).       . calcium-vitamin D (OSCAL WITH D) 500-200 MG-UNIT per tablet Take 1 tablet by mouth 2 (two) times daily.      . COMBIGAN 0.2-0.5 % ophthalmic solution Place 1 drop into the left eye 2 (two) times daily.      . cyclobenzaprine (FLEXERIL) 10 MG tablet Take 10 mg by mouth See admin instructions. Takes everyday twice, can take an additional tablet if needed for muscle spasms      . docusate sodium (COLACE) 100 MG capsule Take 100 mg by mouth 2 (two) times daily.      Marland Kitchen  efavirenz-emtricitabine-tenofovir (ATRIPLA) 600-200-300 MG per tablet Take 1 tablet by mouth at bedtime.  30 tablet  2  . fentaNYL (DURAGESIC) 50 MCG/HR Place 1 patch (50 mcg total) onto the skin every 3 (three) days.  10 patch  0  . HYDROcodone-acetaminophen (NORCO) 10-325 MG per tablet Take 1-2 tablets by mouth every 6 (six) hours as needed for moderate pain.      Marland Kitchen ibuprofen (ADVIL,MOTRIN) 200 MG tablet Take 400 mg by mouth 3 (three) times daily as needed for pain.      Marland Kitchen lovastatin (MEVACOR) 40 MG tablet Take 40 mg by mouth at bedtime.      . megestrol (MEGACE) 40 MG tablet Take 80 mg by mouth 2 (two) times daily.      . Multiple Vitamin (MULTIVITAMIN WITH MINERALS) TABS Take 1 tablet by mouth daily.      . sorbitol 70 % solution Take 15 mLs by mouth daily.      . Tetrahydrozoline HCl (REDNESS RELIEVER EYE DROPS OP) Apply 1 drop to eye once a week. On Sunday mornings before church      . triamcinolone ointment (KENALOG) 0.1 % Apply 1 application topically 2 (two) times daily.      . varenicline (CHANTIX CONTINUING MONTH PAK) 1 MG tablet Take 1 tablet (1 mg total) by mouth 2 (two) times daily.  60 tablet  1  . [DISCONTINUED] varenicline (CHANTIX STARTING MONTH PAK) 0.5 MG X 11 & 1 MG X 42 tablet Take one 0.5 mg tablet by mouth once daily for 3 days, then increase to one 0.5 mg tablet twice daily for 4 days, then increase to one 1 mg tablet twice daily.  53 tablet  0     Allergies: Allergies as of 09/05/2013 - Review Complete 09/05/2013  Allergen Reaction Noted  . Hctz [hydrochlorothiazide] Other (See Comments) 09/05/2013   Past Medical History  Diagnosis Date  . Human immunodeficiency virus disease 03/27/1986  . Tobacco abuse 02/19/2012  . Essential hypertension 07/20/2006  . Hyperlipidemia LDL goal < 100 04/05/2012  . 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 Lumbar degenerative disc disease 07/20/2006    With chronic back pain   . Postmenopausal  osteoporosis 04/15/2012    DEXA 04/15/2012: L1-L4 spine T -3.9, Right femur T -3.0   . Small bowel obstruction due to adhesions 02/08/2012    s/p Exploratory laparotomy, lysis of adhesions 02/12/12    . Constipation due to pain medication 04/27/2010  . Diverticulosis 02/08/2012    Extensive left-sided diverticula on colonoscopy March 2012 per Dr. Gala Romney   . Genital herpes 07/20/2006  . 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 Seborrhea 09/01/2010  . Anxiety 04/05/2012  . Bursitis of right shoulder 07/12/2012    s/p shoulder injection 07/12/2012   . Voiding dysfunction  s/p cystoscopy and meatal dilation Dec 2005  . Cataract of right eye   . Heart murmur 1961  . Blood transfusion without reported diagnosis   . Right rotator cuff tear 02/01/2013    Responds to periodic steroid injections   Past Surgical History  Procedure Laterality Date  . Eye surgery    . Aorto-femoral bypass graft  04/2007  . Appendectomy    . Cholecystectomy    . Colectomy  01/2011    Dr. Margot Chimes; "took out 12 inches of small intestiines and removed blockage"  . Abdominal hysterectomy    . Laparotomy  02/12/2012    Procedure: EXPLORATORY LAPAROTOMY;  Surgeon: Stark Klein, MD;  Location: [facility] OR;  Service: General;  Laterality: N/A;  Exploratory Laparotomy, lysis of adhesions  . Breast surgery      Breast biopsy: negative  . Small intestine surgery     Family History  Problem Relation Age of Onset  . Kidney failure Mother   . Diabetes Mother   . Hypertension Mother   . Heart disease Mother   . Glaucoma Father   . Congestive Heart Failure Sister   . Diabetes Sister   . Kidney disease Sister   . Diabetes Brother   . Unexplained death Brother 4    Automobile accident  . Hypothyroidism Daughter   . Arthritis Daughter     Neck/Back  . Healthy Son   . HIV/AIDS Brother   . HIV Daughter   . Kidney disease Daughter   . Arthritis Son     Knee   History   Social History  . Marital Status: Widowed    Spouse Name: N/A     Number of Children: 15  . Years of Education: 2y college   Occupational History  . 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 Smoking status: Current Every Day Smoker -- 0.10 packs/day for 50 years    Types: Cigars  . Smokeless tobacco: Never Used     Comment: using chantix- smokes black and mild cigars  . Alcohol Use: No     Comment: "last drink of alcohol ~ 1977"  . Drug Use: No     Comment: cutting back on chantix, 1 cigarette this morning  . Sexual Activity: No   Other Topics Concern  . Not on file   Social History Narrative   Lives in [place], [place] with daughter and nephew      Physical Exam: Filed Vitals:   09/05/13 1612  BP: 148/67  Pulse: 71  Temp:   Resp: 16  General: well developed, well nourished; no acute distressed, cooperative with exam Head: atraumatic, normocephalic,  Eye: pupils equal, round and reactive; sclera anicteric; normal conjunctiva  Nose/throat: oropharynx clear, moist mucous membranes, pink gums  Neck: supple, no carotid bruits, thyroid normal in size and without palpable nodules  Lungs/Chest wall: clear to auscultation bilaterally, normal work of breathing  Heart: normal rate and regular rhythm; no murmurs Pulses: radial and dorsalis pedis pulses are 2+ and symmetric  Abdomen:  nondistended, well-healed prior surgical scars, significant tenderness in the left lower quadrant with rebound tenderness in the same area. No guarding. No costovertebral angle tenderness. Normal bowel sounds; no masses or organomegaly  Skin: warm, dry, intact, normal turgor, no rashes  Extremities: no peripheral edema, clubbing, or cyanosis Neurologic: A&O X3, CN II - XII are grossly intact.  moves all extremities.  Psych: mood and  affect are normal and congruent, thought content is normal without delusions, thought process is linear, speech is normal and non-pressured, behavior is normal  Lab results: Basic Metabolic  Panel:  Recent Labs  09/05/13 1142  NA 139  K 4.1  CL 104  CO2 13*  GLUCOSE 92  BUN 9  CREATININE 0.68  CALCIUM 10.7*   Liver Function Tests:  Recent Labs  09/05/13 1142  AST 19  ALT 12  ALKPHOS 61  BILITOT 0.2*  PROT 9.2*  ALBUMIN 4.4    Recent Labs  09/05/13 1142  LIPASE 22  AMYLASE 74   CBC:  Recent Labs  09/05/13 1142  WBC 8.8  NEUTROABS 3.2  HGB 13.9  HCT 39.6  MCV 95.7  PLT 366     Imaging results:  Dg Abd 2 Views  09/05/2013   CLINICAL DATA:  Abdominal pain  EXAM: ABDOMEN - 2 VIEW  COMPARISON:  10/08/2012  FINDINGS: Scattered large and small bowel gas is noted. No obstructive changes are seen. No free air is seen. Diffuse postsurgical changes are noted. No acute bony abnormality is seen.  IMPRESSION: No acute abnormality noted.   Electronically Signed   By: Inez Catalina M.D.   On: 09/05/2013 12:34    Other results: EKG: Not performed   Assessment & Plan by Problem: Principal Problem:   Abdominal pain, left lower quadrant Active Problems:   Human immunodeficiency virus disease   Lumbar degenerative disc disease   Constipation due to pain medication   Diverticulosis   Tobacco abuse   Increased anion gap metabolic acidosis   LLQ pain:  Differential diagnosis for the cause of this left lower quadrant pain include diverticulitis given her history of diverticulosis. However, patient does not have leukocytosis and does not have constitutional symptoms, which makes this unlikely. The patient's labs revealed an anion gap of 22, which raised a concern of ischemic bowel but this was deemed less likely given normal lactic acid level. Small bowel obstruction is unlikely with abdomen x-ray negative for obstructive pattern. Constipation remains a recurrent problem in a patient who is on chronic opiates managed by her PCP. We contemplated doing further abdominal imaging CT but we chose to wait and see how she does overnight. Plan. - N.p.o. except for meds -  Maintenance IV fluids with D5 normal saline 75 cc per hour - Will continue with her pain medications as outpatient - Continue with her constipation regimen including sorbitol, and Senokot. If needed enemas will be tried. - Abdomen exam is not suspicious for acute surgical abdomen at this time. However, patient has had multiple abdominal surgeries, including lysis of adhesions, which puts at an increased risk of small bowel obstruction. - will monitor with above conservative treatment.  Increased anion gap metabolic acidosis: Anion gap of 22. Bicarbonate of 13. This is concerning possibility of early ischemia but normal lactic acidosis goes against this. Patient does not have chronic renal insufficiency. She denies any ingestion of toxins like ethylene glycol, methanol or alcohol. She takes Aspirin 81 mg at home but she denies any other medication.  Plan  - recheck with BMP in am  - consider further work up if Gap persists  Chronic pain: cont with Norco 10-325 milligrams 3 times a day when necessary.  Dispo: Disposition is deferred at this time, awaiting improvement of current medical problems. Anticipated discharge in approximately 1-2 day(s).   The patient does have a current PCP Karren Cobble, MD), therefore is require [facility] follow-up after  discharge.   The patient does not have transportation limitations that hinder transportation to clinic appointments.   Signed:  Jessee Avers, MD PGY-2 Internal Medicine Teaching Service Pager: [phone] (7pm-7am) 09/05/2013, 4:38 PM

## 2013-09-06 ENCOUNTER — Observation Stay (HOSPITAL_COMMUNITY): Payer: Medicare Other

## 2013-09-06 ENCOUNTER — Encounter (HOSPITAL_COMMUNITY): Payer: Self-pay | Admitting: *Deleted

## 2013-09-06 DIAGNOSIS — G8929 Other chronic pain: Secondary | ICD-10-CM

## 2013-09-06 DIAGNOSIS — E872 Acidosis, unspecified: Secondary | ICD-10-CM | POA: Diagnosis not present

## 2013-09-06 DIAGNOSIS — K5900 Constipation, unspecified: Secondary | ICD-10-CM

## 2013-09-06 DIAGNOSIS — R1032 Left lower quadrant pain: Secondary | ICD-10-CM

## 2013-09-06 DIAGNOSIS — K2980 Duodenitis without bleeding: Secondary | ICD-10-CM | POA: Diagnosis not present

## 2013-09-06 DIAGNOSIS — K5730 Diverticulosis of large intestine without perforation or abscess without bleeding: Secondary | ICD-10-CM | POA: Diagnosis not present

## 2013-09-06 DIAGNOSIS — Z21 Asymptomatic human immunodeficiency virus [HIV] infection status: Secondary | ICD-10-CM | POA: Diagnosis not present

## 2013-09-06 DIAGNOSIS — K7689 Other specified diseases of liver: Secondary | ICD-10-CM | POA: Diagnosis not present

## 2013-09-06 LAB — BLOOD GAS, VENOUS
ACID-BASE DEFICIT: 10.6 mmol/L — AB (ref 0.0–2.0)
Bicarbonate: 14.4 mEq/L — ABNORMAL LOW (ref 20.0–24.0)
DRAWN BY: 353861
O2 SAT: 52.3 %
PO2 VEN: 30.6 mmHg (ref 30.0–45.0)
Patient temperature: 98.6
TCO2: 15.3 mmol/L (ref 0–100)
pCO2, Ven: 29.6 mmHg — ABNORMAL LOW (ref 45.0–50.0)
pH, Ven: 7.309 — ABNORMAL HIGH (ref 7.250–7.300)

## 2013-09-06 LAB — BASIC METABOLIC PANEL
BUN: 9 mg/dL (ref 6–23)
CHLORIDE: 111 meq/L (ref 96–112)
CO2: 13 mEq/L — ABNORMAL LOW (ref 19–32)
Calcium: 8.8 mg/dL (ref 8.4–10.5)
Creatinine, Ser: 0.65 mg/dL (ref 0.50–1.10)
GFR calc non Af Amer: 87 mL/min — ABNORMAL LOW (ref >90)
Glucose, Bld: 95 mg/dL (ref 70–99)
POTASSIUM: 4 meq/L (ref 3.7–5.3)
Sodium: 139 mEq/L (ref 137–147)

## 2013-09-06 LAB — SALICYLATE LEVEL: Salicylate Lvl: <2 mg/dL — ABNORMAL LOW (ref 2.8–20.0)

## 2013-09-06 LAB — URINALYSIS, ROUTINE W REFLEX MICROSCOPIC
BILIRUBIN URINE: NEGATIVE
Glucose, UA: NEGATIVE mg/dL
HGB URINE DIPSTICK: NEGATIVE
Ketones, ur: NEGATIVE mg/dL
Leukocytes, UA: NEGATIVE
Nitrite: NEGATIVE
PH: 6.5 (ref 5.0–8.0)
Protein, ur: NEGATIVE mg/dL
SPECIFIC GRAVITY, URINE: 1.044 — AB (ref 1.005–1.030)
Urobilinogen, UA: 0.2 mg/dL (ref 0.0–1.0)

## 2013-09-06 LAB — OSMOLALITY: OSMOLALITY: 285 mosm/kg (ref 275–300)

## 2013-09-06 MED ORDER — NICOTINE 21 MG/24HR TD PT24
21.0000 mg | MEDICATED_PATCH | Freq: Every day | TRANSDERMAL | Status: DC
  Administered 2013-09-06 – 2013-09-09 (×4): 21 mg via TRANSDERMAL
  Filled 2013-09-06 (×4): qty 1

## 2013-09-06 MED ORDER — IOHEXOL 300 MG/ML  SOLN
25.0000 mL | INTRAMUSCULAR | Status: AC
  Administered 2013-09-06 (×2): 25 mL via ORAL

## 2013-09-06 MED ORDER — IOHEXOL 300 MG/ML  SOLN
100.0000 mL | Freq: Once | INTRAMUSCULAR | Status: AC | PRN
  Administered 2013-09-06: 100 mL via INTRAVENOUS

## 2013-09-06 NOTE — Progress Notes (Signed)
09/06/2013 0930 I called teaching service to get more information about patient as to the ABG order. I left a message with my number. I am waiting on their call back before collect the sample. Patient is stable walking down hallway on room air. Rt will continue to monitor.

## 2013-09-06 NOTE — H&P (Signed)
Internal Medicine On-Call Attending Admission Note Date: 09/06/2013  [name]: [id number] Date of birth: May 15, 1940 Age: 73 y.o. Gender: female  I saw and evaluated the patient. I reviewed the resident's note and I agree with the resident's findings and plan as documented in the resident's note, with the following additional comments.  Chief Complaint(s): Left lower quadrant abdominal pain  History - key components related to admission: Patient is a 73 year old woman with history of HIV, hypertension, peripheral vascular disease status post aortobifemoral bypass, diverticulosis, multiple abdominal surgeries with prior episodes of small bowel obstruction secondary to adhesions, chronic pain syndrome, diverticular disease, and other problems as outlined in the medical history, admitted with complaint of left lower quadrant abdominal pain.  The patient started about one week ago and has not improved.  Her last bowel movement was one day prior to admission and was normal.  She denies any nausea or vomiting; she denies fever, chills, chest pain, dysuria, or urinary frequency.   Physical Exam - key components related to admission:  Filed Vitals:   09/05/13 1612 09/05/13 2257 09/06/13 0653 09/06/13 1004  BP: 148/67 120/58 113/55 138/50  Pulse: 71 56 52 65  Temp:  98.3 F (36.8 C) 97.9 F (36.6 C)   TempSrc:  Oral Oral   Resp: 16 16    Height:      Weight:      SpO2: 100% 98% 100%     General: Alert, no distress Lungs: Clear Back: No CVA tenderness Heart: Regular; S1-S2, no S3, no S4, no murmurs Abdomen: Bowel sounds present, soft; moderate left lower quadrant tenderness Extremities: No edema   Lab results:   Basic Metabolic Panel:  Recent Labs  09/05/13 1142 09/06/13 0420  NA 139 139  K 4.1 4.0  CL 104 111  CO2 13* 13*  GLUCOSE 92 95  BUN 9 9  CREATININE 0.68 0.65  CALCIUM 10.7* 8.8    Liver Function Tests:  Recent  Labs  09/05/13 1142  AST 19  ALT 12  ALKPHOS 61  BILITOT 0.2*  PROT 9.2*  ALBUMIN 4.4    Recent Labs  09/05/13 1142  LIPASE 22  AMYLASE 74     CBC:  Recent Labs  09/05/13 1142  WBC 8.8  HGB 13.9  HCT 39.6  MCV 95.7  PLT 366    Recent Labs  09/05/13 1142  NEUTROABS 3.2  LYMPHSABS 5.1*  MONOABS 0.4  EOSABS 0.2  BASOSABS 0.0     Lab Results  Component Value Date   SALICYLATE <3.2* 04/14/5425     Lab Results  Component Value Date   LATICACIDVEN 0.7 09/05/2013     Lab Results  Component Value Date   HIV1RNAQUANT <20 08/25/2013   CD4TABS 2390 08/25/2013      Imaging results:  Dg Abd 2 Views  09/05/2013   CLINICAL DATA:  Abdominal pain  EXAM: ABDOMEN - 2 VIEW  COMPARISON:  10/08/2012  FINDINGS: Scattered large and small bowel gas is noted. No obstructive changes are seen. No free air is seen. Diffuse postsurgical changes are noted. No acute bony abnormality is seen.  IMPRESSION: No acute abnormality noted.   Electronically Signed   By: Inez Catalina M.D.   On: 09/05/2013 12:34    Other results: EKG: Sinus bradycardia; otherwise normal ECG  Assessment & Plan by Problem:  1.  Left lower quadrant abdominal pain.  Patient's presentation and exam are concerning for diverticulitis.  Although she has a  complicated history of multiple prior abdominal surgeries and prior episodes of small bowel obstruction due to adhesions, abdominal x-ray showed no evidence of SBO.  Plan is CT scan of the abdomen and pelvis; n.p.o. except for meds; IV fluids at maintenance rate; follow abdominal exam.  2.  Increased anion gap metabolic acidosis.  The etiology is unclear; lactic acid and salicylate levels were not elevated.  Renal function is normal, and patient denies any potentially toxic ingestions.  Anion gap has improved overnight.  Medication effect is possible, the patient does not have lactic acidosis.  Would discuss with pharmacy whether her anti-retroviral medications could  potentially cause her acidosis without elevating her lactic acid; check osmolal gap; follow electrolytes.  3.  HIV.  Patient is doing well on current Atripla regimen.  Plan is continue current medications unless discussion with pharmacy raises concern about her antiretrovirals being the cause of her metabolic acidosis.  4.  Chronic pain.  Continue home medication regimen.  5.  Other problems and plans as per the resident physician's note.  6.  Dr. Daryll Drown will return as attending physician on Monday 09/08/2013.

## 2013-09-06 NOTE — Progress Notes (Deleted)
UR Completed.  Brown, Shaelynn Jane [phone] 09/06/2013  

## 2013-09-06 NOTE — Progress Notes (Signed)
UR Completed.  [name], Jessica Jane [phone] 09/06/2013  

## 2013-09-06 NOTE — Progress Notes (Addendum)
Subjective:   Day of hospitalization: 1  VSS.  No overnight events.  Pt is feeling "great."  Abdominal is the same as yesterday.  No BM and is not passing gas.  i  Objective:   Vital signs in last 24 hours: Filed Vitals:   2013/09/27 1612 2013-09-27 2257 09/06/13 0653 09/06/13 1004  BP: 148/67 120/58 113/55 138/50  Pulse: 71 56 52 65  Temp:  98.3 F (36.8 C) 97.9 F (36.6 C)   TempSrc:  Oral Oral   Resp: 16 16    Height:      Weight:      SpO2: 100% 98% 100%     Weight: Filed Weights   2013-09-27 1250  Weight: 140 lb 13.6 oz (63.89 kg)    I/Os:  Intake/Output Summary (Last 24 hours) at 09/06/13 1335 Last data filed at 09/06/13 1607  Gross per 24 hour  Intake 1361.25 ml  Output    850 ml  Net 511.25 ml    Physical Exam: Constitutional: Vital signs reviewed.  Patient is sitting up in bed in no acute distress and cooperative with exam.   HEENT: [place]/AT; PERRL, EOMI, conjunctivae normal, no scleral icterus  Cardiovascular: RRR, no MRG Pulmonary/Chest: normal respiratory effort, no accessory muscle use, CTAB, no wheezes, rales, or rhonchi Abdominal: Soft. +BS, NT/ND Neurological: A&O x3, CN II-XII grossly intact; non-focal exam Extremities: 2+DP b/l, no C/C/E  Skin: Warm, dry and intact. No rash  Lab Results:  BMP:  Recent Labs Lab 2013-09-27 1142 09/06/13 0420  NA 139 139  K 4.1 4.0  CL 104 111  CO2 13* 13*  GLUCOSE 92 95  BUN 9 9  CREATININE 0.68 0.65  CALCIUM 10.7* 8.8   Anion Gap:  22-->15  CBC:  Recent Labs Lab September 27, 2013 1142  WBC 8.8  NEUTROABS 3.2  HGB 13.9  HCT 39.6  MCV 95.7  PLT 366   LFTs:  Recent Labs Lab 27-Sep-2013 1142  AST 19  ALT 12  ALKPHOS 61  BILITOT 0.2*  PROT 9.2*  ALBUMIN 4.4    Pancreatic Enzymes:  Recent Labs Lab 09/27/2013 1142  LIPASE 22  AMYLASE 74    Lactic Acid/Procalcitonin:  Recent Labs Lab September 27, 2013 1719  LATICACIDVEN 0.7    Ammonia: No results found for this basename: AMMONIA,  in the  last 168 hours  Cardiac Enzymes: No results found for this basename: CKTOTAL, CKMB, CKMBINDEX, TROPONINI,  in the last 168 hours  EKG: EKG Interpretation  Date/Time:    Ventricular Rate:    PR Interval:    QRS Duration:   QT Interval:    QTC Calculation:   R Axis:     Text Interpretation:     BNP: No results found for this basename: PROBNP,  in the last 168 hours  D-Dimer: No results found for this basename: DDIMER,  in the last 168 hours  Urinalysis: No results found for this basename: COLORURINE, APPERANCEUR, LABSPEC, PHURINE, GLUCOSEU, HGBUR, BILIRUBINUR, KETONESUR, PROTEINUR, UROBILINOGEN, NITRITE, LEUKOCYTESUR,  in the last 168 hours  Studies/Results: Dg Abd 2 Views  09-27-13   CLINICAL DATA:  Abdominal pain  EXAM: ABDOMEN - 2 VIEW  COMPARISON:  10/08/2012  FINDINGS: Scattered large and small bowel gas is noted. No obstructive changes are seen. No free air is seen. Diffuse postsurgical changes are noted. No acute bony abnormality is seen.  IMPRESSION: No acute abnormality noted.   Electronically Signed   By: Inez Catalina M.D.   On: 27-Sep-2013 12:34  Medications:  Scheduled Meds: . amLODipine  10 mg Oral Daily  . aspirin EC  81 mg Oral Daily  . atenolol  25 mg Oral Daily  . brimonidine  1 drop Left Eye BID  . calcium-vitamin D  1 tablet Oral BID  . docusate sodium  100 mg Oral BID  . efavirenz-emtricitabine-tenofovir  1 tablet Oral QHS  . enoxaparin (LOVENOX) injection  40 mg Subcutaneous Q24H  . feeding supplement (ENSURE COMPLETE)  237 mL Oral BID BM  . [START ON 09/07/2013] fentaNYL  50 mcg Transdermal Q72H  . megestrol  20 mg Oral BID  . multivitamin with minerals  1 tablet Oral Daily  . nicotine  21 mg Transdermal Daily  . potassium chloride SA  20 mEq Oral BID  . simvastatin  20 mg Oral q1800  . timolol  1 drop Left Eye BID   Continuous Infusions: . dextrose 5 % and 0.9% NaCl 75 mL/hr at 09/06/13 1157   PRN Meds: ALPRAZolam, cyclobenzaprine,  HYDROcodone-acetaminophen, sorbitol  Antibiotics: Antibiotics Given (last 72 hours)   Date/Time Action Medication Dose   09/05/13 2220 Given   efavirenz-emtricitabine-tenofovir (ATRIPLA) 600-200-300 MG per tablet 1 tablet 1 tablet      Day of Hospitalization: 1  Consults:    Assessment/Plan:   Principal Problem:   Abdominal pain, left lower quadrant Active Problems:   Human immunodeficiency virus disease   Lumbar degenerative disc disease   Constipation due to pain medication   Diverticulosis   Tobacco abuse   Increased anion gap metabolic acidosis  Acute on chronic LLQ abdominal pain  Pt reports pain comes and goes, is sharp, and has occurred four times in the past day.  Diverticulitis is possible but pt without fever or leukocytosis.  Anion gap acidosis initially concerning for ischemic bowel, but lactic acidosis normal.  Abd XR not concerning for SBO.  Constipation remains high on the differential given her chronic use of opiods.  Also, Atripla can cause abdominal pain.   -order CT scan Abd/pelvis  -cont with pain medications as outpatient  -cont constipation regimen including sorbitol, and senokot -add enema -will monitor with above conservative treatment for now  Anion gap metabolic acidosis Anion gap of 22-->15, which has decreased with IVF. Bicarbonate remains low at 13.  Bowel ischemia ruled out with normal lactic acidosis.  No CKD.  Denies any ingestion of ethylene glycol, methanol or alcohol. Take ASA 81mg  at home but she denies any other medication.  Could this be Atripla causing acidosis? -recheck with BMP in am  -check VBG -consider further w/u if Gap persists  -ask ID for input regarding Atripla  Chronic pain  -cont Norco 10-325 milligrams 3 times a day PRN  F/E/N Fluids- None Electrolytes- Replete as needed  Nutrition- [place]   VTE PPx  lovenox 40mg  SQ qd   Disposition Disposition is deferred, awaiting improvement of current medical problems.   Anticipated discharge in approximately 1-2 day(s).     LOS: 1 day   Jones Bales, MD PGY-1, Internal Medicine Teaching Service [phone] (7AM-5PM Mon-Fri) 09/06/2013, 1:35 PM

## 2013-09-07 DIAGNOSIS — E872 Acidosis, unspecified: Secondary | ICD-10-CM | POA: Diagnosis not present

## 2013-09-07 DIAGNOSIS — G8929 Other chronic pain: Secondary | ICD-10-CM | POA: Diagnosis not present

## 2013-09-07 DIAGNOSIS — K5900 Constipation, unspecified: Secondary | ICD-10-CM | POA: Diagnosis not present

## 2013-09-07 DIAGNOSIS — K2980 Duodenitis without bleeding: Secondary | ICD-10-CM | POA: Diagnosis not present

## 2013-09-07 LAB — BASIC METABOLIC PANEL
BUN: 6 mg/dL (ref 6–23)
CHLORIDE: 112 meq/L (ref 96–112)
CO2: 13 mEq/L — ABNORMAL LOW (ref 19–32)
CREATININE: 0.58 mg/dL (ref 0.50–1.10)
Calcium: 8.6 mg/dL (ref 8.4–10.5)
GFR calc Af Amer: >90 mL/min (ref >90)
GFR calc non Af Amer: >90 mL/min (ref >90)
GLUCOSE: 106 mg/dL — AB (ref 70–99)
POTASSIUM: 4 meq/L (ref 3.7–5.3)
Sodium: 140 mEq/L (ref 137–147)

## 2013-09-07 MED ORDER — SODIUM BICARBONATE 650 MG PO TABS
650.0000 mg | ORAL_TABLET | Freq: Two times a day (BID) | ORAL | Status: DC
  Administered 2013-09-07 – 2013-09-09 (×4): 650 mg via ORAL
  Filled 2013-09-07 (×5): qty 1

## 2013-09-07 MED ORDER — SORBITOL 70 % SOLN
15.0000 mL | Freq: Once | Status: AC
  Administered 2013-09-07: 15 mL via ORAL
  Filled 2013-09-07: qty 30

## 2013-09-07 NOTE — Progress Notes (Signed)
Internal Medicine Attending  Date: 09/07/2013  Patient name: Jessica [name] [facility] Medical record number: [id number] Date of birth: 01-25-1941 Age: 73 y.o. Gender: female  I saw and evaluated the patient. I discussed patient and reviewed the resident's note by Dr. Alice Rieger, and I agree with the resident's findings and plans as documented in his note, with the following additional comments.  Patient's left lower quadrant pain is somewhat better, and she is less tender.  CT scan showed no explanation for her left lower quadrant pain.  Agree with plans to advance diet.  Her metabolic acidosis persists, with anion gap of 15; venous blood gas showed a metabolic acidosis with respiratory compensation.  Her osmolal gap is normal.  Would discuss with patient's ID physician in a.m. given concern as to whether this may be anti-retroviral medication effect, although that is not clear.  Dr. Daryll Drown will return as attending physician tomorrow 09/08/2013.

## 2013-09-07 NOTE — Progress Notes (Signed)
Subjective:   Day of hospitalization: 2 She reports to be feeling better with improving abdominal pain. She has not had a bowel movement in three days. She requests for sorbitol.  Discussed CT abd/pelvis findings with her.  No overnight events.   Objective:   Vital signs in last 24 hours: Filed Vitals:   09/06/13 1004 09/06/13 1500 09/06/13 2145 09/07/13 0558  BP: 138/50 135/61 136/64 127/56  Pulse: 65 66 68 79  Temp:  97.8 F (36.6 C) 98.4 F (36.9 C) 98.6 F (37 C)  TempSrc:  Oral Oral Oral  Resp:  '16 18 18  ' Height:      Weight:      SpO2:  100% 100% 99%    Weight: Filed Weights   09/05/13 1250  Weight: 140 lb 13.6 oz (63.89 kg)    I/Os:  Intake/Output Summary (Last 24 hours) at 09/07/13 0920 Last data filed at 09/07/13 0558  Gross per 24 hour  Intake      0 ml  Output   1000 ml  Net  -1000 ml    Physical Exam: Constitutional: Vital signs reviewed.  Having breakfast. No acute distress.  Cardiovascular: RRR, no MRG Pulmonary/Chest:clear to auscultation bilaterally Abdominal: Soft. +BS, improved LLQ tenderness. No rebound tenderness or guarding Neurological: A&O x3,  non-focal exam Extremities: no edema.  Skin: Warm, dry and intact. No rash  Lab Results:  BMP:  Recent Labs Lab 09/06/13 0420 09/07/13 0655  NA 139 140  K 4.0 4.0  CL 111 112  CO2 13* 13*  GLUCOSE 95 106*  BUN 9 6  CREATININE 0.65 0.58  CALCIUM 8.8 8.6   Anion Gap:  22-->15 >15  CBC:  Recent Labs Lab 09/05/13 1142  WBC 8.8  NEUTROABS 3.2  HGB 13.9  HCT 39.6  MCV 95.7  PLT 366   LFTs:  Recent Labs Lab 09/05/13 1142  AST 19  ALT 12  ALKPHOS 61  BILITOT 0.2*  PROT 9.2*  ALBUMIN 4.4    Pancreatic Enzymes:  Recent Labs Lab 09/05/13 1142  LIPASE 22  AMYLASE 74    Lactic Acid/Procalcitonin:  Recent Labs Lab 09/05/13 1719  LATICACIDVEN 0.7       Urinalysis:  Recent Labs Lab 09/06/13 1722  COLORURINE YELLOW  LABSPEC 1.044*  PHURINE 6.5    GLUCOSEU NEGATIVE  HGBUR NEGATIVE  BILIRUBINUR NEGATIVE  KETONESUR NEGATIVE  PROTEINUR NEGATIVE  UROBILINOGEN 0.2  NITRITE NEGATIVE  LEUKOCYTESUR NEGATIVE    Studies/Results: Ct Abdomen Pelvis W Contrast  09/06/2013   CLINICAL DATA:  Left lower quadrant pain. History of small bowel obstruction.  EXAM: CT ABDOMEN AND PELVIS WITH CONTRAST  TECHNIQUE: Multidetector CT imaging of the abdomen and pelvis was performed using the standard protocol following bolus administration of intravenous contrast.  CONTRAST:  129m OMNIPAQUE IOHEXOL 300 MG/ML  SOLN  COMPARISON:  CT abdomen pelvis 10/08/2012.  FINDINGS: Visualization of the lower thorax demonstrates patchy ground-glass pulmonary opacities, favored to represent atelectasis. Normal heart size.  Liver is normal in size and contour without focal hepatic lesion identified. Status post cholecystectomy. Unchanged mild intrahepatic biliary ductal dilatation. Unchanged extrahepatic biliary ductal dilatation measuring up to 10 mm, likely secondary to post cholecystectomy state. The spleen, pancreas and bilateral adrenal glands are unremarkable.  Kidneys enhance symmetrically with contrast. Normal caliber abdominal aorta. 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  Urinary bladder is unremarkable. Sigmoid colonic diverticulosis without CT evidence for acute diverticulitis. There  is mild circumferential wall thickening of the duodenum. Mild periduodenal fat stranding. Narrowing and suggestion of mild wall thickening of the colon at the level of the splenic flexure. 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 No free fluid or free intraperitoneal air.  No aggressive or acute appearing osseous lesions.  IMPRESSION: Mild circumferential wall thickening of the duodenum with periduodenal fat stranding. This may be secondary to duodenitis.  Mild wall  thickening of the colon at the level of the splenic flexure may be secondary to underdistention. Colitis is not excluded.  Colonic diverticulosis.  Ground-glass opacity within bilateral lower lobes is favored to represent atelectasis.  Persistent intrahepatic and extrahepatic biliary ductal dilatation, presumably secondary to cholecystectomy. This can be correlated with laboratory analysis as clinically indicated.   Electronically Signed   By: Lovey Newcomer M.D.   On: 09/06/2013 15:58   Dg Abd 2 Views  09/05/2013   CLINICAL DATA:  Abdominal pain  EXAM: ABDOMEN - 2 VIEW  COMPARISON:  10/08/2012  FINDINGS: Scattered large and small bowel gas is noted. No obstructive changes are seen. No free air is seen. Diffuse postsurgical changes are noted. No acute bony abnormality is seen.  IMPRESSION: No acute abnormality noted.   Electronically Signed   By: Inez Catalina M.D.   On: 09/05/2013 12:34    Medications:  Scheduled Meds: . amLODipine  10 mg Oral Daily  . aspirin EC  81 mg Oral Daily  . atenolol  25 mg Oral Daily  . brimonidine  1 drop Left Eye BID  . calcium-vitamin D  1 tablet Oral BID  . docusate sodium  100 mg Oral BID  . efavirenz-emtricitabine-tenofovir  1 tablet Oral QHS  . enoxaparin (LOVENOX) injection  40 mg Subcutaneous Q24H  . feeding supplement (ENSURE COMPLETE)  237 mL Oral BID BM  . fentaNYL  50 mcg Transdermal Q72H  . megestrol  20 mg Oral BID  . multivitamin with minerals  1 tablet Oral Daily  . nicotine  21 mg Transdermal Daily  . simvastatin  20 mg Oral q1800  . sorbitol  15 mL Oral Once  . timolol  1 drop Left Eye BID   Continuous Infusions: . dextrose 5 % and 0.9% NaCl 75 mL/hr at 09/07/13 0709   PRN Meds: ALPRAZolam, cyclobenzaprine, HYDROcodone-acetaminophen  Antibiotics: Antibiotics Given (last 72 hours)   Date/Time Action Medication Dose   09/05/13 2220 Given   efavirenz-emtricitabine-tenofovir (ATRIPLA) 600-200-300 MG per tablet 1 tablet 1 tablet   09/06/13 2201  Given   efavirenz-emtricitabine-tenofovir (ATRIPLA) 600-200-300 MG per tablet 1 tablet 1 tablet      Day of Hospitalization: 2  Consults:    Assessment/Plan:   Principal Problem:   Abdominal pain, left lower quadrant Active Problems:   Human immunodeficiency virus disease   Lumbar degenerative disc disease   Constipation due to pain medication   Diverticulosis   Tobacco abuse   Increased anion gap metabolic acidosis  Chronic constipation with LLQ pain: Improving clinically. This is the most likely cause of her abdominal pain. She has not had BM in three days. Constipation related to opiates. CT abdomen/pelvis with contrast reveals duodenitis and mild wall thickening of the colon at the level of the splenic flexure? Colitis - both these are chronic. Colonic diverticulosis without diverticulitis. Urinalysis does not reveal a UTI. Abd XR not concerning for SBO.  Plan  - given sorbitol now, if no bowel movement by afternoon, will give enema.  -  if she has a good bowel movement today, she can be discharged. Otherwise, very likely d/c tomorrow - allow a regular diet  - will stop IVF  - pain is better and abdominal exam has significantly improved.   Anion gap metabolic acidosis and Non Anion Gap Met Acidosis: Unclear etiology. Apparently Atripla can do this though commonly with lactic acidosis. Persistently low bicarb of 13. Anion gap improved 22>>15. Lactic acid and salicylates level normal. No evidence of bowel ischemia and she denies any ingestion of ethylene glycol, methanol or alcohol. No osmolar gap. VBG from 09/06/2013 with 7.309/29.6/30.6 and delta-delta <1 consistent with mixed acid base disorder - anion gap and non-anion gap met acidosis.  Plan - trial of PO sodium  - will repeat BMET in am if she stays in-hospital - otherwise, can be followed up as outpatient with consideration of referral to renal   Chronic pain  -cont Norco 10-325 milligrams 3 times a day  PRN  F/E/N Fluids- None Electrolytes- Replete as needed  Nutrition- regular   VTE PPx  lovenox 39m SQ qd   Disposition Disposition is deferred, awaiting improvement of current medical problems.  Anticipated discharge later today or tomorrow depending on whether she gets a good bowel movement and if abdominal pain is better.      LOS: 2 days   RJessee Avers MD PGY-2, Internal Medicine Teaching Service [phone]/28/2015, 9:20 AM

## 2013-09-08 ENCOUNTER — Ambulatory Visit: Payer: Medicare Other | Admitting: Infectious Disease

## 2013-09-08 DIAGNOSIS — K2980 Duodenitis without bleeding: Secondary | ICD-10-CM | POA: Diagnosis present

## 2013-09-08 DIAGNOSIS — E872 Acidosis, unspecified: Secondary | ICD-10-CM | POA: Diagnosis not present

## 2013-09-08 DIAGNOSIS — G8929 Other chronic pain: Secondary | ICD-10-CM | POA: Diagnosis not present

## 2013-09-08 DIAGNOSIS — R1032 Left lower quadrant pain: Secondary | ICD-10-CM | POA: Diagnosis not present

## 2013-09-08 LAB — BASIC METABOLIC PANEL
BUN: 5 mg/dL — AB (ref 6–23)
CHLORIDE: 112 meq/L (ref 96–112)
CO2: 15 meq/L — AB (ref 19–32)
CREATININE: 0.56 mg/dL (ref 0.50–1.10)
Calcium: 8.8 mg/dL (ref 8.4–10.5)
GFR calc Af Amer: >90 mL/min (ref >90)
GFR calc non Af Amer: >90 mL/min (ref >90)
GLUCOSE: 101 mg/dL — AB (ref 70–99)
Potassium: 3.7 mEq/L (ref 3.7–5.3)
Sodium: 142 mEq/L (ref 137–147)

## 2013-09-08 MED ORDER — ENSURE COMPLETE PO LIQD: 237.0000 mL | Freq: Two times a day (BID) | ORAL | Status: DC

## 2013-09-08 MED ORDER — MAGNESIUM CITRATE PO SOLN
1.0000 | Freq: Once | ORAL | Status: AC
  Administered 2013-09-08: 1 via ORAL
  Filled 2013-09-08: qty 296

## 2013-09-08 MED ORDER — KETOROLAC TROMETHAMINE 15 MG/ML IJ SOLN
15.0000 mg | Freq: Once | INTRAMUSCULAR | Status: AC
  Administered 2013-09-08: 15 mg via INTRAVENOUS
  Filled 2013-09-08: qty 1

## 2013-09-08 MED ORDER — SODIUM BICARBONATE 650 MG PO TABS: 650.0000 mg | ORAL_TABLET | Freq: Two times a day (BID) | ORAL | Status: DC

## 2013-09-08 NOTE — Progress Notes (Signed)
  Date: 09/08/2013  Patient name: Jessica [name] [facility]  Medical record number: [id number]  Date of birth: 01-06-1941   This patient has been seen and the plan of care was discussed with the house staff. Please see their note for complete details. I concur with their findings with the following additions/corrections:  Patient's pain unchanged.  She reports that symptoms have been due to constipation in the past, but this is more severe and persistent.  She has only had a small hard BM since being here.  Would recommend attempted magnesium citrate X 1 to help with having a BM and seeing if patient improves.  She also has possible colitis at splenic flexure on CT abdomen, but this is not clear and she has no other signs/symptoms of infection or inflammation.  Her AG acidosis is unclear in cause and her antiviral medication usually dose not cause an AG.  She has normal renal function.  Would give magnesium citrate X 1 today and re-evaluate.    Sid Falcon, MD 09/08/2013, 10:31 AM

## 2013-09-08 NOTE — Progress Notes (Signed)
Subjective:   Day of hospitalization: 3  Pt admitted for concern for SBO, XR did not reveal SBO.  CT Abd revealed possible duodenitis/colitis?  Afebrile, no leukocytosis, normal lactic acid.  VSS.  No overnight events.  Pt is feeling the same as yesterday.  Abdominal pain the same, still sharp, comes and goes, lasts for a few seconds.  Had a small hard BM yesterday after an enema.  Eating well.  No N/V.      Objective:   Vital signs in last 24 hours: Filed Vitals:   09/07/13 1414 09/07/13 2131 09/08/13 0635 09/08/13 0916  BP: 108/77 136/60 158/70 149/63  Pulse: 82 67 71 71  Temp: 98.4 F (36.9 C) 98.3 F (36.8 C) 98.2 F (36.8 C)   TempSrc: Oral Oral Oral   Resp: 16 16 16    Height:      Weight:      SpO2: 98% 100% 98%     Weight: Filed Weights   09/05/13 1250  Weight: 140 lb 13.6 oz (63.89 kg)    I/Os:  Intake/Output Summary (Last 24 hours) at 09/08/13 0920 Last data filed at 09/08/13 0644  Gross per 24 hour  Intake 4045.75 ml  Output    400 ml  Net 3645.75 ml    Physical Exam: Constitutional: Vital signs reviewed.  Patient is sitting up in bed in no acute distress and cooperative with exam.   HEENT: [place]/AT; PERRL, EOMI, conjunctivae normal, no scleral icterus  Cardiovascular: RRR, no MRG Pulmonary/Chest: normal respiratory effort, no accessory muscle use, CTAB, no wheezes, rales, or rhonchi Abdominal: Soft. decreased BS, diffuse tenderness>LLQ, +rebound, no guarding  Neurological: A&O x3, CN II-XII grossly intact; non-focal exam Extremities: 2+DP b/l, no C/C/E  Skin: Warm, dry and intact. No rash  Lab Results:  BMP:  Recent Labs Lab 09/07/13 0655 09/08/13 0345  NA 140 142  K 4.0 3.7  CL 112 112  CO2 13* 15*  GLUCOSE 106* 101*  BUN 6 5*  CREATININE 0.58 0.56  CALCIUM 8.6 8.8   Anion Gap:  22-->15  CBC:  Recent Labs Lab 09/05/13 1142  WBC 8.8  NEUTROABS 3.2  HGB 13.9  HCT 39.6  MCV 95.7  PLT 366   LFTs:  Recent Labs Lab  09/05/13 1142  AST 19  ALT 12  ALKPHOS 61  BILITOT 0.2*  PROT 9.2*  ALBUMIN 4.4    Pancreatic Enzymes:  Recent Labs Lab 09/05/13 1142  LIPASE 22  AMYLASE 74    Lactic Acid/Procalcitonin:  Recent Labs Lab 09/05/13 1719  LATICACIDVEN 0.7   Urinalysis:  Recent Labs Lab 09/06/13 1722  COLORURINE YELLOW  LABSPEC 1.044*  PHURINE 6.5  GLUCOSEU NEGATIVE  HGBUR NEGATIVE  BILIRUBINUR NEGATIVE  KETONESUR NEGATIVE  PROTEINUR NEGATIVE  UROBILINOGEN 0.2  NITRITE NEGATIVE  LEUKOCYTESUR NEGATIVE    Studies/Results: Ct Abdomen Pelvis W Contrast  09/06/2013   CLINICAL DATA:  Left lower quadrant pain. History of small bowel obstruction.  EXAM: CT ABDOMEN AND PELVIS WITH CONTRAST  TECHNIQUE: Multidetector CT imaging of the abdomen and pelvis was performed using the standard protocol following bolus administration of intravenous contrast.  CONTRAST:  116mL OMNIPAQUE IOHEXOL 300 MG/ML  SOLN  COMPARISON:  CT abdomen pelvis 10/08/2012.  FINDINGS: Visualization of the lower thorax demonstrates patchy ground-glass pulmonary opacities, favored to represent atelectasis. Normal heart size.  Liver is normal in size and contour without focal hepatic lesion identified. Status post cholecystectomy. Unchanged mild intrahepatic biliary ductal dilatation. Unchanged extrahepatic biliary ductal  dilatation measuring up to 10 mm, likely secondary to post cholecystectomy state. The spleen, pancreas and bilateral adrenal glands are unremarkable.  Kidneys enhance symmetrically with contrast. Normal caliber abdominal aorta. 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  Urinary bladder is unremarkable. Sigmoid colonic diverticulosis without CT evidence for acute diverticulitis. There is mild circumferential wall thickening of the duodenum. Mild periduodenal fat stranding. Narrowing and suggestion of mild wall thickening of the colon at the  level of the splenic flexure. 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 No free fluid or free intraperitoneal air.  No aggressive or acute appearing osseous lesions.  IMPRESSION: Mild circumferential wall thickening of the duodenum with periduodenal fat stranding. This may be secondary to duodenitis.  Mild wall thickening of the colon at the level of the splenic flexure may be secondary to underdistention. Colitis is not excluded.  Colonic diverticulosis.  Ground-glass opacity within bilateral lower lobes is favored to represent atelectasis.  Persistent intrahepatic and extrahepatic biliary ductal dilatation, presumably secondary to cholecystectomy. This can be correlated with laboratory analysis as clinically indicated.   Electronically Signed   By: Lovey Newcomer M.D.   On: 09/06/2013 15:58    Medications:  Scheduled Meds: . amLODipine  10 mg Oral Daily  . aspirin EC  81 mg Oral Daily  . atenolol  25 mg Oral Daily  . brimonidine  1 drop Left Eye BID  . calcium-vitamin D  1 tablet Oral BID  . docusate sodium  100 mg Oral BID  . efavirenz-emtricitabine-tenofovir  1 tablet Oral QHS  . enoxaparin (LOVENOX) injection  40 mg Subcutaneous Q24H  . feeding supplement (ENSURE COMPLETE)  237 mL Oral BID BM  . fentaNYL  50 mcg Transdermal Q72H  . magnesium citrate  1 Bottle Oral Once  . megestrol  20 mg Oral BID  . multivitamin with minerals  1 tablet Oral Daily  . nicotine  21 mg Transdermal Daily  . simvastatin  20 mg Oral q1800  . sodium bicarbonate  650 mg Oral BID  . timolol  1 drop Left Eye BID   Continuous Infusions: . dextrose 5 % and 0.9% NaCl 75 mL/hr at 09/07/13 2014   PRN Meds: ALPRAZolam, cyclobenzaprine, HYDROcodone-acetaminophen  Antibiotics: Antibiotics Given (last 72 hours)   Date/Time Action Medication Dose   09/05/13 2220 Given   efavirenz-emtricitabine-tenofovir (ATRIPLA) 600-200-300 MG per tablet 1 tablet 1 tablet   09/06/13  2201 Given   efavirenz-emtricitabine-tenofovir (ATRIPLA) 600-200-300 MG per tablet 1 tablet 1 tablet   09/07/13 2200 Given   efavirenz-emtricitabine-tenofovir (ATRIPLA) 600-200-300 MG per tablet 1 tablet 1 tablet      Day of Hospitalization: 3  Consults:    Assessment/Plan:   Principal Problem:   Abdominal pain, left lower quadrant Active Problems:   Human immunodeficiency virus disease   Lumbar degenerative disc disease   Constipation due to pain medication   Diverticulosis   Tobacco abuse   Increased anion gap metabolic acidosis   Duodenitis without hemorrhage  Acute on chronic LLQ abdominal pain  Pain the same.  Small BM yesterday after enema.  AG acidosis initially concerning for ischemic bowel, but lactic acidosis normal.  Abd XR not concerning for SBO.  CTAbd/pelvis reveals duodenitis with mild wall thickening of colon at splenic flexure level, cannot r/o colitis.  Colonic diverticulosis without diverticulitis.  UA non-revealing.  Constipation remains high on the differential given her chronic use of opiods.  Also, Atripla can cause abdominal pain.   -cont with pain medications as outpatient  -cont constipation regimen including sorbitol, and senokot -add magnesium citrate today  -check Hpylori   Anion gap metabolic acidosis Anion gap of 22-->15, which has decreased with IVF. Bicarbonate remains low.  Added sodium bicarb yesterday.   Bowel ischemia ruled out with normal lactic acidosis.  No CKD.  Denies ingestions of ethylene glycol, methanol or alcohol. Take ASA 81mg  at home but she denies any other medication.  Could this be Atripla causing acidosis? -continue sodium bicarb   Chronic pain  -cont Norco 10-325 milligrams 3 times a day PRN  F/E/N Fluids- None Electrolytes- Replete as needed  Nutrition- Heart healthy  VTE PPx  lovenox 40mg  SQ qd   Disposition Anticipated discharge possibly later today if successful BM.    LOS: 3 days   Jones Bales,  MD PGY-1, Internal Medicine Teaching Service [phone] (7AM-5PM Mon-Fri) 09/08/2013, 9:20 AM

## 2013-09-09 DIAGNOSIS — E872 Acidosis, unspecified: Secondary | ICD-10-CM | POA: Diagnosis not present

## 2013-09-09 DIAGNOSIS — R1032 Left lower quadrant pain: Secondary | ICD-10-CM | POA: Diagnosis not present

## 2013-09-09 DIAGNOSIS — G8929 Other chronic pain: Secondary | ICD-10-CM | POA: Diagnosis not present

## 2013-09-09 DIAGNOSIS — K2980 Duodenitis without bleeding: Secondary | ICD-10-CM | POA: Diagnosis not present

## 2013-09-09 LAB — BASIC METABOLIC PANEL
BUN: 8 mg/dL (ref 6–23)
CHLORIDE: 112 meq/L (ref 96–112)
CO2: 20 meq/L (ref 19–32)
Calcium: 8.9 mg/dL (ref 8.4–10.5)
Creatinine, Ser: 0.59 mg/dL (ref 0.50–1.10)
GFR calc Af Amer: >90 mL/min (ref >90)
GFR calc non Af Amer: >90 mL/min (ref >90)
Glucose, Bld: 85 mg/dL (ref 70–99)
POTASSIUM: 3.9 meq/L (ref 3.7–5.3)
SODIUM: 145 meq/L (ref 137–147)

## 2013-09-09 MED ORDER — MAGNESIUM HYDROXIDE 800 MG/5ML PO SUSP: 30.0000 mL | Freq: Every day | ORAL | Status: DC

## 2013-09-09 NOTE — Progress Notes (Signed)
Subjective:   Day of hospitalization: 4  Pt admitted for concern for SBO, XR did not reveal SBO.  CT Abd revealed possible duodenitis/colitis?  Afebrile, no leukocytosis, normal bicarb.  VSS.  No overnight events.  Pt is feeling better this AM after BM last PM.     Objective:   Vital signs in last 24 hours: Filed Vitals:   09/08/13 1426 09/08/13 2135 09/09/13 0530 09/09/13 0915  BP: 164/77 153/59 126/58 164/61  Pulse: 79 65 67 68  Temp: 98.2 F (36.8 C) 98.5 F (36.9 C) 98.6 F (37 C)   TempSrc: Oral Oral Oral   Resp: 16 16 16    Height:      Weight:      SpO2: 100% 96% 100%     Weight: Filed Weights   09/05/13 1250  Weight: 140 lb 13.6 oz (63.89 kg)    I/Os:  Intake/Output Summary (Last 24 hours) at 09/09/13 1007 Last data filed at 09/09/13 0600  Gross per 24 hour  Intake 751.25 ml  Output    200 ml  Net 551.25 ml    Physical Exam: Constitutional: Vital signs reviewed.  Patient is sitting up in bed in no acute distress and cooperative with exam.   HEENT: [place]/AT; PERRL, EOMI, conjunctivae normal, no scleral icterus  Cardiovascular: RRR, no MRG Pulmonary/Chest: normal respiratory effort, no accessory muscle use, CTAB, no wheezes, rales, or rhonchi Abdominal: Soft. decreased BS, diffuse tenderness>LLQ, +rebound, no guarding  Neurological: A&O x3, CN II-XII grossly intact; non-focal exam Extremities: 2+DP b/l, no C/C/E  Skin: Warm, dry and intact. No rash  Lab Results:  BMP:  Recent Labs Lab 09/08/13 0345 09/09/13 0640  NA 142 145  K 3.7 3.9  CL 112 112  CO2 15* 20  GLUCOSE 101* 85  BUN 5* 8  CREATININE 0.56 0.59  CALCIUM 8.8 8.9   Anion Gap:  22-->15  CBC:  Recent Labs Lab 09/05/13 1142  WBC 8.8  NEUTROABS 3.2  HGB 13.9  HCT 39.6  MCV 95.7  PLT 366   LFTs:  Recent Labs Lab 09/05/13 1142  AST 19  ALT 12  ALKPHOS 61  BILITOT 0.2*  PROT 9.2*  ALBUMIN 4.4    Pancreatic Enzymes:  Recent Labs Lab 09/05/13 1142  LIPASE 22   AMYLASE 74    Lactic Acid/Procalcitonin:  Recent Labs Lab 09/05/13 1719  LATICACIDVEN 0.7   Urinalysis:  Recent Labs Lab 09/06/13 1722  COLORURINE YELLOW  LABSPEC 1.044*  PHURINE 6.5  GLUCOSEU NEGATIVE  HGBUR NEGATIVE  BILIRUBINUR NEGATIVE  KETONESUR NEGATIVE  PROTEINUR NEGATIVE  UROBILINOGEN 0.2  NITRITE NEGATIVE  LEUKOCYTESUR NEGATIVE    Studies/Results: No results found.  Medications:  Scheduled Meds: . amLODipine  10 mg Oral Daily  . aspirin EC  81 mg Oral Daily  . atenolol  25 mg Oral Daily  . brimonidine  1 drop Left Eye BID  . calcium-vitamin D  1 tablet Oral BID  . docusate sodium  100 mg Oral BID  . efavirenz-emtricitabine-tenofovir  1 tablet Oral QHS  . enoxaparin (LOVENOX) injection  40 mg Subcutaneous Q24H  . feeding supplement (ENSURE COMPLETE)  237 mL Oral BID BM  . fentaNYL  50 mcg Transdermal Q72H  . megestrol  20 mg Oral BID  . multivitamin with minerals  1 tablet Oral Daily  . nicotine  21 mg Transdermal Daily  . simvastatin  20 mg Oral q1800  . sodium bicarbonate  650 mg Oral BID  . timolol  1 drop Left Eye BID   Continuous Infusions:   PRN Meds: ALPRAZolam, cyclobenzaprine, HYDROcodone-acetaminophen  Antibiotics: Antibiotics Given (last 72 hours)   Date/Time Action Medication Dose   09/06/13 2201 Given   efavirenz-emtricitabine-tenofovir (ATRIPLA) 888-916-945 MG per tablet 1 tablet 1 tablet   09/07/13 2200 Given   efavirenz-emtricitabine-tenofovir (ATRIPLA) 038-882-800 MG per tablet 1 tablet 1 tablet   09/08/13 2308 Given   efavirenz-emtricitabine-tenofovir (ATRIPLA) 349-179-150 MG per tablet 1 tablet 1 tablet      Day of Hospitalization: 4  Consults:    Assessment/Plan:   Principal Problem:   Abdominal pain, left lower quadrant Active Problems:   Human immunodeficiency virus disease   Lumbar degenerative disc disease   Constipation due to pain medication   Diverticulosis   Tobacco abuse   Increased anion gap  metabolic acidosis   Duodenitis without hemorrhage  Acute on chronic LLQ abdominal pain  Pain better this AM.  She had a large BM last PM following magnesium citrate.  Also had a small BM this AM.  Bicarb is now wnl since beginning sodium bicarb.  Sodium wnl.  She feels better today, but is unsure if this is related to her BM.   AG acidosis initially concerning for ischemic bowel, but lactic acidosis normal.  Abd XR not concerning for SBO.  CTAbd/pelvis reveals duodenitis with mild wall thickening of colon at splenic flexure level, cannot r/o colitis.  Colonic diverticulosis without diverticulitis.  UA non-revealing.  Constipation remains high on the differential given her chronic use of opiods.  Also, Atripla can cause abdominal pain.   -cont with pain medications as outpatient  -cont constipation regimen including sorbitol, senokot, and add MOM -Hpylori pending  Anion gap metabolic acidosis Anion gap of 22-->13, which has decreased with IVF and sodium bicarb.  Bicarbonate wnl.  Bowel ischemia ruled out with normal lactic acidosis.  No CKD.  Denies ingestions of ethylene glycol, methanol or alcohol. Take ASA 81mg  at home but she denies any other medication.   -continue sodium bicarb 650mg  bid  Chronic pain  -cont norco 10-325 milligrams 3 times a day PRN  F/E/N Fluids- None Electrolytes- Replete as needed  Nutrition- Heart healthy  VTE PPx  lovenox 40mg  SQ qd   Disposition Anticipated discharge today.    LOS: 4 days   Jones Bales, MD PGY-1, Internal Medicine Teaching Service [phone] (7AM-5PM Mon-Fri) 09/09/2013, 10:07 AM

## 2013-09-09 NOTE — Discharge Instructions (Signed)
Thank you for allowing [name] to be involved in your healthcare while you were hospitalized at [facility].  Please note that there have been changes to your home medications.  --> PLEASE LOOK AT YOUR DISCHARGE MEDICATION LIST FOR DETAILS. Be sure to come for your appointment on 09/15/2013  Please call your PCP if you have any questions or concerns, or any difficulty getting any of your medications. Please return to the ER if you have worsening of your symptoms or new severe symptoms arise.

## 2013-09-09 NOTE — Care Management Note (Signed)
  Page 1 of 1   09/09/2013     10:09:16 AM CARE MANAGEMENT NOTE 09/09/2013  Patient:  Jessica [name], Jessica [name]   Account Number:  [id number]  Date Initiated:  09/09/2013  Documentation initiated by:  Magdalen Spatz  Subjective/Objective Assessment:     Action/Plan:   Anticipated DC Date:     Anticipated DC Plan:           Choice offered to / List presented to:             Status of service:   Medicare Important Message given?   (If response is "NO", the following Medicare IM given date fields will be blank) Date Medicare IM given:   Medicare IM given by:   Date Additional Medicare IM given:   Additional Medicare IM given by:    Discharge Disposition:    Per UR Regulation:    If discussed at Long Length of Stay Meetings, dates discussed:    Comments:  09-09-13 Dr. Michail Jewels, MD Pager: [phone] aware. Magdalen Spatz RN SBN      09-09-13 Consult for assistance with Ensure . Provided patient with a discount card for Ensure . Patient needs a prescription for Ensure . Discount card is good for prescriptions of Ensure of 18 bottles or more . Patient has to call number on card . Patient voiced understanding . Magdalen Spatz RN SBN [phone]

## 2013-09-09 NOTE — Discharge Summary (Signed)
Name: Jessica [name] MRN: [id number] DOB: 1940/12/28 73 y.o. PCP: Karren Cobble, MD ____________________________________________________  Date of Admission: 09/05/2013 12:44 PM Date of Discharge: 09/09/2013 Attending Physician: Dr. Sid Falcon    Discharge Diagnosis: Acute on chronic LLQ abdominal pain  Anion gap metabolic acidosis  Chronic pain  Chronic constipation  HIV disease  Discharge Medications:   Medication List         acyclovir 400 MG tablet  Commonly known as:  ZOVIRAX  Take 400 mg by mouth 2 (two) times daily as needed (out breaks).     alendronate 70 MG tablet  Commonly known as:  FOSAMAX  Take 1 tablet (70 mg total) by mouth once a week. On an empty stomach with 8 ounces of water     ALPRAZolam 1 MG tablet  Commonly known as:  XANAX  Take 1 tablet (1 mg total) by mouth 3 (three) times daily as needed for anxiety.     amLODipine 10 MG tablet  Commonly known as:  NORVASC  Take 1 tablet (10 mg total) by mouth daily.     aspirin 81 MG EC tablet  Take 81 mg by mouth daily.     atenolol 25 MG tablet  Commonly known as:  TENORMIN  Take 1 tablet (25 mg total) by mouth daily.     benazepril 40 MG tablet  Commonly known as:  LOTENSIN  Take 40 mg by mouth daily.     calcium-vitamin D 500-200 MG-UNIT per tablet  Commonly known as:  OSCAL WITH D  Take 1 tablet by mouth 2 (two) times daily.     COMBIGAN 0.2-0.5 % ophthalmic solution  Generic drug:  brimonidine-timolol  Place 1 drop into the left eye 2 (two) times daily.     cyclobenzaprine 10 MG tablet  Commonly known as:  FLEXERIL  Take 10 mg by mouth See admin instructions. Takes everyday twice, can take an additional tablet if needed for muscle spasms     docusate sodium 100 MG capsule  Commonly known as:  COLACE  Take 100 mg by mouth 2 (two) times daily.     efavirenz-emtricitabine-tenofovir 600-200-300 MG per tablet  Commonly known as:  ATRIPLA  Take 1 tablet by mouth at  bedtime.     feeding supplement (ENSURE COMPLETE) Liqd  Take 237 mLs by mouth 2 (two) times daily between meals.     fentaNYL 50 MCG/HR  Commonly known as:  DURAGESIC  Place 1 patch (50 mcg total) onto the skin every 3 (three) days.     HYDROcodone-acetaminophen 10-325 MG per tablet  Commonly known as:  NORCO  Take 1-2 tablets by mouth every 6 (six) hours as needed for moderate pain.     ibuprofen 200 MG tablet  Commonly known as:  ADVIL,MOTRIN  Take 400 mg by mouth 3 (three) times daily as needed for pain.     lovastatin 40 MG tablet  Commonly known as:  MEVACOR  Take 40 mg by mouth at bedtime.     magnesium hydroxide 800 MG/5ML suspension  Commonly known as:  MILK OF MAGNESIA  Take 30 mLs by mouth daily.     megestrol 40 MG tablet  Commonly known as:  MEGACE  Take 80 mg by mouth 2 (two) times daily.     multivitamin with minerals Tabs tablet  Take 1 tablet by mouth daily.     REDNESS RELIEVER EYE DROPS OP  Apply 1 drop to eye once a week. On  Sunday mornings before church     ROLAIDS EXTRA STRENGTH PO  Take 1 tablet by mouth as needed (acid reflux).     sodium bicarbonate 650 MG tablet  Take 1 tablet (650 mg total) by mouth 2 (two) times daily.     sorbitol 70 % solution  Take 15 mLs by mouth daily.     triamcinolone ointment 0.1 %  Commonly known as:  KENALOG  Apply 1 application topically 2 (two) times daily.     varenicline 1 MG tablet  Commonly known as:  CHANTIX CONTINUING MONTH PAK  Take 1 tablet (1 mg total) by mouth 2 (two) times daily.        Disposition and follow-up:   Jessica [name] was discharged from [facility] in good condition to home.  Please address the following problems post-discharge:  1.Resolution of LLQ abdominal pain 2.Chronic constipation, added MOM for bowel regimen during hospitalization; are her BM becoming more regular? 3.Anion gap metabolic acidosis; unclear etiology, started on bicarb during  hospitalization, recheck BMP 4.Requesting refill of pain meds during hospitalization, last refill 08/19/13 for 30 day supply according to narcotic database  Labs / imaging needed at time of follow-up: BMP  Pending labs/ test needing follow-up: None   Follow-up Appointments: Follow-up Information   Follow up with Duwaine Maxin, DO On 09/15/2013. (8:15AM)    Specialty:  Internal Medicine   Contact information:   [place] [address] [phone]       Discharge Instructions: Discharge Instructions   Call MD for:  difficulty breathing, headache or visual disturbances    Complete by:  As directed      Call MD for:  persistant dizziness or light-headedness    Complete by:  As directed      Call MD for:  persistant nausea and vomiting    Complete by:  As directed      Call MD for:  severe uncontrolled pain    Complete by:  As directed      Diet - low sodium heart healthy    Complete by:  As directed      Increase activity slowly    Complete by:  As directed            Consultations:  None  Procedures Performed:  Ct Abdomen Pelvis W Contrast  09/06/2013   CLINICAL DATA:  Left lower quadrant pain. History of small bowel obstruction.  EXAM: CT ABDOMEN AND PELVIS WITH CONTRAST  TECHNIQUE: Multidetector CT imaging of the abdomen and pelvis was performed using the standard protocol following bolus administration of intravenous contrast.  CONTRAST:  123mL OMNIPAQUE IOHEXOL 300 MG/ML  SOLN  COMPARISON:  CT abdomen pelvis 10/08/2012.  FINDINGS: Visualization of the lower thorax demonstrates patchy ground-glass pulmonary opacities, favored to represent atelectasis. Normal heart size.  Liver is normal in size and contour without focal hepatic lesion identified. Status post cholecystectomy. Unchanged mild intrahepatic biliary ductal dilatation. Unchanged extrahepatic biliary ductal dilatation measuring up to 10 mm, likely secondary to post cholecystectomy state. The spleen, pancreas  and bilateral adrenal glands are unremarkable.  Kidneys enhance symmetrically with contrast. Normal caliber abdominal aorta. 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  Urinary bladder is unremarkable. Sigmoid colonic diverticulosis without CT evidence for acute diverticulitis. There is mild circumferential wall thickening of the duodenum. Mild periduodenal fat stranding. Narrowing and suggestion of mild wall thickening of the colon at the level of  the splenic flexure. 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 No free fluid or free intraperitoneal air.  No aggressive or acute appearing osseous lesions.  IMPRESSION: Mild circumferential wall thickening of the duodenum with periduodenal fat stranding. This may be secondary to duodenitis.  Mild wall thickening of the colon at the level of the splenic flexure may be secondary to underdistention. Colitis is not excluded.  Colonic diverticulosis.  Ground-glass opacity within bilateral lower lobes is favored to represent atelectasis.  Persistent intrahepatic and extrahepatic biliary ductal dilatation, presumably secondary to cholecystectomy. This can be correlated with laboratory analysis as clinically indicated.   Electronically Signed   By: Lovey Newcomer M.D.   On: 09/06/2013 15:58   Dg Abd 2 Views  09/05/2013   CLINICAL DATA:  Abdominal pain  EXAM: ABDOMEN - 2 VIEW  COMPARISON:  10/08/2012  FINDINGS: Scattered large and small bowel gas is noted. No obstructive changes are seen. No free air is seen. Diffuse postsurgical changes are noted. No acute bony abnormality is seen.  IMPRESSION: No acute abnormality noted.   Electronically Signed   By: Inez Catalina M.D.   On: 09/05/2013 12:34    2D Echo: N/A  Cardiac Cath: N/A  Admission HPI:  The patient is a 73 yo AA woman with h/o HIV (last CD4 2390 08/2013), HTN, PVD, chronic constipation,  diverticulosis, DDD, two prior SBOs w/ multiple abd surgeries who presents to clinic with c/o LLQ pain for a week. She describes that pain as sharp, 8/10, constant, nonradiating, without nausea, vomiting, or diarrhea. She reports, that she constantly needs to take stool softeners, including Dulcolax and sorbitol. Her last bowel movement was 1 day prior to admission and this was followed by several days of constipation. She denies any constitutional symptoms, like fevers, chills, or increased fatigue. She was admitted for further evaluation for concern of SBO. Her most recent admission was on 10/07/2012 for similar symptoms. She responded well to a combination of Senokot, soapsuds enema and sorbitol and discharged after one day with resolution of her symptoms.  Jessee Avers, MD  Hospital Course by problem list: Principal Problem:   Abdominal pain, left lower quadrant Active Problems:   Human immunodeficiency virus disease   Lumbar degenerative disc disease   Constipation due to pain medication   Diverticulosis   Tobacco abuse   Increased anion gap metabolic acidosis   Duodenitis without hemorrhage   Acute on chronic LLQ abdominal pain  Pt p/w LLQ abdominal pain with a h/o a SBO.  Differentials considered include diverticulitis given the location of her pain and previous h/o diverticulosis.  However, patient without leukocytosis and no constitutional symptoms which makes this unlikely.  Labs reveal AG of 22, which raised initial concern for ischemic bowel but less likely given normal lactic acid level. Abdominal exam not suspicious for acute abdomen. However, she had had multiple abdominal surgeries for lysis of adhesions.  SBO unlikely given XR Abd neg.  Constipation remains a recurrent problem in a patient who is on chronic opiates for chronic pain managed by her PCP.  She was admitted to observation and kept NPO.  She was started on NS at 28ml/h and continued pain meds and constipation regimen.   She was also given an enema with no BM and then given mag citrate which produced two BM with minimal relief of her abdominal pain.  CT Abd/pelvis revealed duodenitis with mild wall thickening of colon at splenic flexure level, cannot r/o colitis.  Colonic diverticulosis without diverticulitis. UA non-revealing.  H pylori was checked and was negative.  She was discharged with the addition of MOM to her bowel regimen and should follow-up closely with her PCP.      Anion gap metabolic acidosis  Initial anion gap of 22-->15, which has decreased with IVF. Bicarbonate remains low. Added sodium bicarb with resolution. Bowel ischemia ruled out with normal lactic acidosis. No CKD. Denies ingestions of ethylene glycol, methanol or alcohol. Takes ASA 81mg  at home but she denies any other medication. Discussed whether this could be atripla causing acidosis but per ID, this is less likely.  Also wondered if could be a RTA however, this would be more c/w a non-anion gap acidosis which she does not have.  Chronic pain  Continued norco 10-325 milligrams 3 times a day PRN.  HIV disease Stable.    Discharge Vitals:   BP 164/61  Pulse 68  Temp(Src) 98.6 F (37 C) (Oral)  Resp 16  Ht 5\' 8"  (1.727 m)  Wt 140 lb 13.6 oz (63.89 kg)  BMI 21.42 kg/m2  SpO2 100%  Discharge Labs:  Results for orders placed during the hospital encounter of 09/05/13 (from the past 24 hour(s))  BASIC METABOLIC PANEL     Status: None   Collection Time    09/09/13  6:40 AM      Result Value Ref Range   Sodium 145  137 - 147 mEq/L   Potassium 3.9  3.7 - 5.3 mEq/L   Chloride 112  96 - 112 mEq/L   CO2 20  19 - 32 mEq/L   Glucose, Bld 85  70 - 99 mg/dL   BUN 8  6 - 23 mg/dL   Creatinine, Ser 0.59  0.50 - 1.10 mg/dL   Calcium 8.9  8.4 - 10.5 mg/dL   GFR calc non Af Amer >90  >90 mL/min   GFR calc Af Amer >90  >90 mL/min    Signed: Jones Bales, MD 09/09/2013, 1:43 PM   Services Ordered on Discharge: None Equipment Ordered  on Discharge: None

## 2013-09-09 NOTE — Progress Notes (Signed)
  Date: 09/09/2013  Patient name: Jessica [name] [facility]  Medical record number: [id number]  Date of birth: February 01, 1941   This patient has been seen and the plan of care was discussed with the house staff. Please see their note for complete details. I concur with their findings with the following additions/corrections:  Possible cause of metabolic acidosis could be RTA type 1.  She has the inappropriately elevated pH of the urine, mildly elevated hypercalcemia on admission (Better with fluids) and acidosis.  Against this cause would be the Anion Gap component of her acidosis.  Unclear cause, but improved with bicarb supplementation. Her abdominal pain is better after BM. She can be discharged home.  Sid Falcon, MD 09/09/2013, 1:06 PM

## 2013-09-09 NOTE — Progress Notes (Signed)
Discharge instructions/prescriptions given and explained to pt.  Pt verbalizes understanding of all orders/instructions and denies any questions.  IV removed.  VSS. Pt awaiting ride from daughter. Syliva Overman

## 2013-09-09 NOTE — Progress Notes (Signed)
Pt discharged to home with daughter. Jessica [name]

## 2013-09-10 LAB — H.PYLORI ANTIGEN, STOOL

## 2013-09-13 ENCOUNTER — Other Ambulatory Visit: Payer: Self-pay | Admitting: Infectious Disease

## 2013-09-13 NOTE — Discharge Summary (Signed)
I saw Jessica [name] on day of discharge and assisted in the d/c planning.

## 2013-09-15 ENCOUNTER — Encounter: Payer: Self-pay | Admitting: Internal Medicine

## 2013-09-15 ENCOUNTER — Ambulatory Visit (INDEPENDENT_AMBULATORY_CARE_PROVIDER_SITE_OTHER): Payer: Medicare Other | Admitting: Internal Medicine

## 2013-09-15 VITALS — BP 160/80 | HR 70 | Temp 99.7°F | Ht 68.0 in | Wt 141.6 lb

## 2013-09-15 DIAGNOSIS — R1032 Left lower quadrant pain: Secondary | ICD-10-CM

## 2013-09-15 DIAGNOSIS — E872 Acidosis, unspecified: Secondary | ICD-10-CM | POA: Diagnosis not present

## 2013-09-15 DIAGNOSIS — K5909 Other constipation: Secondary | ICD-10-CM

## 2013-09-15 DIAGNOSIS — M5136 Other intervertebral disc degeneration, lumbar region: Secondary | ICD-10-CM

## 2013-09-15 DIAGNOSIS — I10 Essential (primary) hypertension: Secondary | ICD-10-CM | POA: Diagnosis not present

## 2013-09-15 DIAGNOSIS — B029 Zoster without complications: Secondary | ICD-10-CM | POA: Diagnosis not present

## 2013-09-15 DIAGNOSIS — B20 Human immunodeficiency virus [HIV] disease: Secondary | ICD-10-CM | POA: Diagnosis not present

## 2013-09-15 DIAGNOSIS — J329 Chronic sinusitis, unspecified: Secondary | ICD-10-CM | POA: Diagnosis not present

## 2013-09-15 DIAGNOSIS — K5903 Drug induced constipation: Secondary | ICD-10-CM

## 2013-09-15 DIAGNOSIS — G894 Chronic pain syndrome: Secondary | ICD-10-CM | POA: Diagnosis not present

## 2013-09-15 DIAGNOSIS — M5137 Other intervertebral disc degeneration, lumbosacral region: Secondary | ICD-10-CM | POA: Diagnosis not present

## 2013-09-15 DIAGNOSIS — E8729 Other acidosis: Secondary | ICD-10-CM

## 2013-09-15 LAB — BASIC METABOLIC PANEL WITH GFR
BUN: 7 mg/dL (ref 6–23)
CHLORIDE: 107 meq/L (ref 96–112)
CO2: 21 mEq/L (ref 19–32)
Calcium: 9.9 mg/dL (ref 8.4–10.5)
Creat: 0.62 mg/dL (ref 0.50–1.10)
GFR, Est Non African American: >89 mL/min
GLUCOSE: 99 mg/dL (ref 70–99)
POTASSIUM: 4.2 meq/L (ref 3.5–5.3)
SODIUM: 144 meq/L (ref 135–145)

## 2013-09-15 MED ORDER — HYDROCODONE-ACETAMINOPHEN 10-325 MG PO TABS
1.0000 | ORAL_TABLET | Freq: Three times a day (TID) | ORAL | Status: DC | PRN
Start: 2013-09-18 — End: 2013-09-15

## 2013-09-15 MED ORDER — HYDROCODONE-ACETAMINOPHEN 10-325 MG PO TABS: 1.0000 | ORAL_TABLET | Freq: Three times a day (TID) | ORAL | Status: DC | PRN

## 2013-09-15 MED ORDER — FENTANYL 50 MCG/HR TD PT72: 50.0000 ug | MEDICATED_PATCH | TRANSDERMAL | Status: DC

## 2013-09-15 NOTE — Assessment & Plan Note (Signed)
AG had improved to 13 at discharge.  Patient discharged on sodium bicarb.   - BMP today - will decide wether or not to d/c sodium bicarb based on BMP results

## 2013-09-15 NOTE — Assessment & Plan Note (Signed)
She reports improvement in constipation with addition of MOM.  Last normal BM this morning.  Her abdominal pain is also improving.  She was advised to return to clinic if symptoms worsen or if new symptoms arise. She continues to require opioids for DDD pain.

## 2013-09-15 NOTE — Assessment & Plan Note (Signed)
Back pain is controlled with vicodin, flexeril and fentanyl.  Also, uses ibuprofen prn. - Vicodin 10/325mg  (1 tablet) q3h prn #60 refilled with 2 additional rx (for next three months) - Fentanyl patch 51mcg/hr q72hr patch #10 refilled with 2 additional rx (for next three months) - she has enough flexeril

## 2013-09-15 NOTE — Patient Instructions (Signed)
1. You have hospital follow up appointments as follows:   2. Please take all medications as prescribed.   Thank you for bringing your medicines today. This helps [name] keep you safe from mistakes.   3. If you have worsening of your symptoms or new symptoms arise, please call the clinic ([phone]), or go to the ER immediately if symptoms are severe.

## 2013-09-15 NOTE — Assessment & Plan Note (Signed)
Pain is still present but improved since discharge.  She has been advised to continue bowel regimen including MOM and to return to clinic if symptoms worsen or to go to ED if severe symptoms (including N/V, worsening constipation).  Otherwise, she will follow-up with PCP in 3 months.

## 2013-09-15 NOTE — Assessment & Plan Note (Signed)
BP elevated today. Recheck was 160/39mmHg.  She reports compliance with medications and says she has taken them today.  She says she was not rushing this AM but she did drink coffee.  She has brought a log book with her BP recording.  The majority of readings are within normal range.  Will defer increasing BP medications today since she appears to have normal pressures on average.   - recheck BP in 3 months

## 2013-09-15 NOTE — Progress Notes (Signed)
   Subjective:    Patient ID: Jessica [name], female    DOB: 1940/05/20, 73 y.o.   MRN: [id number]  HPI Comments: Jessica [name] is a 73 year old woman with a PMH of HTN, HIV (VL < 26, CD4 2390), hx of multiple abdominal surgeries and SBO x 2, diverticulosis, DDD on chronic opioids, chronic constipation and tobacco use disorder who was recently hospitalized with abdominal pain.   SBO and ischemic bowel were ruled out.  MOM was added to her bowel regimen and she feels it is helping.  She notes softer stools.  Last normal BM was this morning.  Her abdominal pain is also improving.  She denies N/V or fevers.  She continues to require medication for back pain.  She uses Fentanyl patch q3days, flexeril and Vicodin (at least once per day).  She is taking Chantix but continues to smoke.  She is down to 4 cigarettes per day.  She is tolerating Chantix, no nightmares.     Review of Systems  Constitutional: Negative for fever, chills and appetite change.  HENT: Negative for rhinorrhea and sore throat.   Eyes: Negative for visual disturbance.  Respiratory: Positive for cough. Negative for shortness of breath.        Occasional  Cardiovascular: Negative for chest pain, palpitations and leg swelling.  Gastrointestinal: Positive for constipation. Negative for nausea, vomiting, diarrhea and blood in stool.       Chronic, improving  Genitourinary: Negative for dysuria, frequency and hematuria.  Musculoskeletal: Positive for back pain.       Chronic  Neurological: Negative for syncope, light-headedness and headaches.       Objective:   Physical Exam  Constitutional: She is oriented to person, place, and time. She appears well-developed. No distress.  HENT:  Head: Normocephalic and atraumatic.  Right Ear: External ear normal.  Left Ear: External ear normal.  Mouth/Throat: Oropharynx is clear and moist. No oropharyngeal exudate.  Eyes: Conjunctivae and EOM are normal. Pupils are equal,  round, and reactive to light.  Cardiovascular: Normal rate, regular rhythm and normal heart sounds.  Exam reveals no gallop and no friction rub.   No murmur heard. Pulmonary/Chest: Effort normal and breath sounds normal. No respiratory distress. She has no wheezes. She has no rales.  Abdominal: Soft. Bowel sounds are normal. She exhibits no distension and no mass. There is tenderness. There is no rebound and no guarding.  Musculoskeletal: Normal range of motion. She exhibits no edema and no tenderness.  Lymphadenopathy:    She has no cervical adenopathy.  Neurological: She is alert and oriented to person, place, and time. No cranial nerve deficit.  Skin: Skin is warm. She is not diaphoretic.  Psychiatric: She has a normal mood and affect. Her behavior is normal.          Assessment & Plan:  Please see problem based assessment and plan.

## 2013-09-16 ENCOUNTER — Ambulatory Visit: Payer: Medicare Other

## 2013-09-18 NOTE — Progress Notes (Signed)
Case discussed with Dr. Wilson at the time of the visit.  We reviewed the resident's history and exam and pertinent patient test results.  I agree with the assessment, diagnosis, and plan of care documented in the resident's note. 

## 2013-09-22 ENCOUNTER — Telehealth: Payer: Self-pay | Admitting: Internal Medicine

## 2013-09-22 ENCOUNTER — Other Ambulatory Visit: Payer: Self-pay | Admitting: Internal Medicine

## 2013-09-22 DIAGNOSIS — E872 Acidosis: Secondary | ICD-10-CM

## 2013-09-22 DIAGNOSIS — R8290 Unspecified abnormal findings in urine: Secondary | ICD-10-CM

## 2013-09-22 DIAGNOSIS — E8729 Other acidosis: Secondary | ICD-10-CM

## 2013-09-22 NOTE — Telephone Encounter (Signed)
I spoke with patient via telephone.  I have asked her to continue sodium bicarbonate supplementation because her bicarb is on the low end of normal and AG increased to 16.  Will have her return to clinic on 07/20 for repeat labs.  Still unclear etiology.

## 2013-09-23 ENCOUNTER — Other Ambulatory Visit: Payer: Self-pay | Admitting: *Deleted

## 2013-09-23 DIAGNOSIS — B20 Human immunodeficiency virus [HIV] disease: Secondary | ICD-10-CM

## 2013-09-23 MED ORDER — EFAVIRENZ-EMTRICITAB-TENOFOVIR 600-200-300 MG PO TABS: 1.0000 | ORAL_TABLET | Freq: Every day | ORAL | Status: DC

## 2013-09-23 NOTE — Telephone Encounter (Signed)
ADAP application 

## 2013-09-24 ENCOUNTER — Other Ambulatory Visit (HOSPITAL_COMMUNITY): Payer: Self-pay | Admitting: Internal Medicine

## 2013-09-24 DIAGNOSIS — E872 Acidosis: Secondary | ICD-10-CM

## 2013-09-24 DIAGNOSIS — E8729 Other acidosis: Secondary | ICD-10-CM

## 2013-09-25 ENCOUNTER — Other Ambulatory Visit: Payer: Self-pay | Admitting: *Deleted

## 2013-09-25 DIAGNOSIS — B20 Human immunodeficiency virus [HIV] disease: Secondary | ICD-10-CM

## 2013-09-25 MED ORDER — EFAVIRENZ-EMTRICITAB-TENOFOVIR 600-200-300 MG PO TABS: 1.0000 | ORAL_TABLET | Freq: Every day | ORAL | Status: DC

## 2013-09-29 ENCOUNTER — Other Ambulatory Visit (INDEPENDENT_AMBULATORY_CARE_PROVIDER_SITE_OTHER): Payer: Medicare Other

## 2013-09-29 DIAGNOSIS — E872 Acidosis, unspecified: Secondary | ICD-10-CM

## 2013-09-29 DIAGNOSIS — R8290 Unspecified abnormal findings in urine: Secondary | ICD-10-CM

## 2013-09-29 DIAGNOSIS — N3000 Acute cystitis without hematuria: Secondary | ICD-10-CM

## 2013-09-29 DIAGNOSIS — E8729 Other acidosis: Secondary | ICD-10-CM

## 2013-09-29 DIAGNOSIS — R82998 Other abnormal findings in urine: Secondary | ICD-10-CM | POA: Diagnosis not present

## 2013-09-30 ENCOUNTER — Other Ambulatory Visit: Payer: Self-pay | Admitting: Internal Medicine

## 2013-09-30 DIAGNOSIS — R3915 Urgency of urination: Secondary | ICD-10-CM

## 2013-09-30 DIAGNOSIS — N3000 Acute cystitis without hematuria: Secondary | ICD-10-CM

## 2013-09-30 LAB — URINALYSIS, MICROSCOPIC ONLY
Casts: NONE SEEN
Crystals: NONE SEEN

## 2013-09-30 LAB — URINALYSIS, ROUTINE W REFLEX MICROSCOPIC
BILIRUBIN URINE: NEGATIVE
Glucose, UA: NEGATIVE mg/dL
HGB URINE DIPSTICK: NEGATIVE
KETONES UR: NEGATIVE mg/dL
Nitrite: NEGATIVE
Protein, ur: NEGATIVE mg/dL
Specific Gravity, Urine: 1.01 (ref 1.005–1.030)
UROBILINOGEN UA: 0.2 mg/dL (ref 0.0–1.0)
pH: 7 (ref 5.0–8.0)

## 2013-09-30 LAB — BASIC METABOLIC PANEL WITH GFR
BUN: 7 mg/dL (ref 6–23)
CO2: 16 mEq/L — ABNORMAL LOW (ref 19–32)
CREATININE: 0.7 mg/dL (ref 0.50–1.10)
Calcium: 9.7 mg/dL (ref 8.4–10.5)
Chloride: 111 mEq/L (ref 96–112)
GFR, EST NON AFRICAN AMERICAN: 87 mL/min
GLUCOSE: 88 mg/dL (ref 70–99)
Potassium: 3.3 mEq/L — ABNORMAL LOW (ref 3.5–5.3)
Sodium: 139 mEq/L (ref 135–145)

## 2013-09-30 MED ORDER — LEVOFLOXACIN 250 MG PO TABS: 250.0000 mg | ORAL_TABLET | Freq: Every day | ORAL | Status: AC

## 2013-09-30 MED ORDER — POTASSIUM CHLORIDE CRYS ER 20 MEQ PO TBCR: 40.0000 meq | EXTENDED_RELEASE_TABLET | Freq: Every day | ORAL | Status: DC

## 2013-09-30 MED ORDER — NITROFURANTOIN MONOHYD MACRO 100 MG PO CAPS: 100.0000 mg | ORAL_CAPSULE | Freq: Two times a day (BID) | ORAL | Status: DC

## 2013-09-30 NOTE — Addendum Note (Signed)
Addended by: Orson Gear on: 09/30/2013 10:21 AM   Modules accepted: Orders

## 2013-10-02 ENCOUNTER — Telehealth: Payer: Self-pay | Admitting: Internal Medicine

## 2013-10-02 LAB — URINE CULTURE: Colony Count: >=100000

## 2013-10-02 NOTE — Telephone Encounter (Signed)
I called the patient and informed her of her lab/UA results on 09/30/13.  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  I advised her to continue taking bicarb supplement and I prescribed Kdur 4mEq daily.  I will have her follow-up next month.  I prescribed her Levaquin for UTI.

## 2013-10-15 ENCOUNTER — Ambulatory Visit (INDEPENDENT_AMBULATORY_CARE_PROVIDER_SITE_OTHER): Payer: Medicare Other | Admitting: Internal Medicine

## 2013-10-15 ENCOUNTER — Encounter: Payer: Self-pay | Admitting: Internal Medicine

## 2013-10-15 VITALS — BP 140/79 | HR 74 | Temp 97.3°F | Ht 67.9 in | Wt 142.1 lb

## 2013-10-15 DIAGNOSIS — B20 Human immunodeficiency virus [HIV] disease: Secondary | ICD-10-CM | POA: Diagnosis not present

## 2013-10-15 DIAGNOSIS — M5137 Other intervertebral disc degeneration, lumbosacral region: Secondary | ICD-10-CM | POA: Diagnosis not present

## 2013-10-15 DIAGNOSIS — R1032 Left lower quadrant pain: Secondary | ICD-10-CM

## 2013-10-15 DIAGNOSIS — E872 Acidosis, unspecified: Secondary | ICD-10-CM | POA: Diagnosis not present

## 2013-10-15 DIAGNOSIS — J329 Chronic sinusitis, unspecified: Secondary | ICD-10-CM | POA: Diagnosis not present

## 2013-10-15 DIAGNOSIS — B029 Zoster without complications: Secondary | ICD-10-CM | POA: Diagnosis not present

## 2013-10-15 DIAGNOSIS — I10 Essential (primary) hypertension: Secondary | ICD-10-CM | POA: Diagnosis not present

## 2013-10-15 DIAGNOSIS — K5909 Other constipation: Secondary | ICD-10-CM | POA: Diagnosis not present

## 2013-10-15 DIAGNOSIS — Z720 Tobacco use: Secondary | ICD-10-CM

## 2013-10-15 DIAGNOSIS — F17200 Nicotine dependence, unspecified, uncomplicated: Secondary | ICD-10-CM

## 2013-10-15 DIAGNOSIS — G894 Chronic pain syndrome: Secondary | ICD-10-CM | POA: Diagnosis not present

## 2013-10-15 NOTE — Assessment & Plan Note (Signed)
Pt currently smoking 3 cigarettes a day, down from 1/2 ppd. Pt seems ready to quit. Agreed to try to completely cut out cigarettes for next few weeks and see how she does. Talked with patient about setting official quit date at next visit and she agreed to this plan.  - Continue Chantix - Set quit date at next appointment

## 2013-10-15 NOTE — Progress Notes (Signed)
   Subjective:    Patient ID: Jessica [name], female    DOB: 1940-07-16, 73 y.o.   MRN: [id number]  HPI [name] is a 73yo woman w/ PMHx of multiple abdominal surgeries and SBO x 2, diverticulitis, HTN, HIV (VL <20, CD4 2390 on 08/25/13), and smoking who presents today for a follow-up visit after recent hospitalization for abdominal pain.   1. Abdominal Pain: Patient was hospitalized on 6/26-6/30 for acute on chronic LLQ abdominal pain. SBO and diverticulitis were ruled out. She had a follow-up visit on 09/15/13 and stated her abdominal pain was improving. A U/A and urine culture were done that showed a UTI and + growth of E.coli. She was treated with Levofloxacin for 3 days that was completed on 10/02/13. Today, patient states her abdominal pain is completely gone. She feels that she is in great health. She reports she has had UTIs in the past that had similar LLQ pain. She states she usually wears pads every 24 hours because of urinary leakage, but has recently only been wearing pads when she leaves the house every few days. She denies any sexual intercourse since her husband passed in 2006. She denies fever, chills, nausea, vomiting, dysuria, hematuria, frequency, and diarrhea.  2. HTN: Patient takes Amlodipine 10 mg daily and Atenolol 25 mg daily at home. She states she has been using these medications as prescribed. She brought her BP log book today and shows BPs ranging from 120-180s/70-90s, mostly in 120-140/70-80s. BP at past clinic visits has been elevated 160-170s/70-80s. Today her BP is 140/79.   3. Smoking: Patient states she started Chantix in June 2015 and feels that it has helped her cut down on her smoking. She was previously smoking 1/2 ppd and currently is smoking 3 cigarettes/day. She would like to try to quit, but states it is difficult because she has cigarettes around the house and has been smoking for so long.    Review of Systems General: Reports good appetite.  Denies changes in weight, night sweats  HEENT: Denies headaches, ear pain, changes in vision, rhinorrhea, sore throat CV: Denies CP, SOB, palpitations, orthopnea Pulm: Denies SOB, cough, wheezing GI: Denies constipation, dysphagia, melena, hematochezia GU: See HPI Msk: Denies muscle cramps, joint pains Neuro: Denies numbness/tingling, weakness Skin: Denies rashes, bruises  Objective:   Physical Exam General: Elderly woman who appears her stated age, NAD HEENT: [place]/AT, EOMI, conjunctiva normal, sclera anicteric, mucus membranes moist Neck: supple, no JVD CV: RRR, normal S1/S2, no m/g/r Pulm: CTA bilaterally, breaths non-labored Abd: Midline scar present above umbilicus. BS+, soft, non-distended, non-tender, no suprapubic or CVA tenderness Ext: warm, moves all, no edema Neuro: alert and oriented x 3, pleasant, cooperative, CNs II-XII intact, strength 5/5 in upper and lower extremities    Assessment & Plan:

## 2013-10-15 NOTE — Patient Instructions (Signed)
It was a pleasure taking care of you today, Jessica [name].  Here is a summary of what we discussed today:  1. Abdominal Pain- Resolved. Urinalysis showed that you had a UTI from last visit. You were treated with Levofloxacin. If you start to have symptoms of a UTI again please come back to the clinic.  2. Smoking: Please try to completely cut out cigarettes. We can make a quit date at your next appointment.  3. High Blood Pressure: Continue your Amlodipine and Atenolol medications, and continue logging your blood pressures. Please remember to bring your home blood pressure cuff to your next follow-up appointment.

## 2013-10-15 NOTE — Assessment & Plan Note (Signed)
Patient found to have UTI (E.coli) from U/A and culture at last visit. She was treated with Levofloxacin and completed treatment on 10/02/13. Pt asymptomatic now.  - Pt instructed to come back to clinic if UTI symptoms (dysuria, fever, abdominal pain) occur - Pt instructed to go back to wearing pads for urinary leakage daily

## 2013-10-15 NOTE — Assessment & Plan Note (Signed)
BPs continue to be elevated in clinic vs. home readings.  - Pt instructed to bring in BP cuff at next visit to determine if cuff giving accurate readings - Continue Amlodipine and Atenolol, may need adjustment at next visit if home cuff not accurate - Continue home BP log

## 2013-10-21 NOTE — Progress Notes (Signed)
INTERNAL MEDICINE TEACHING ATTENDING ADDENDUM - Aldine Contes, MD: I personally saw and evaluated [name] in this clinic visit in conjunction with the resident, Dr. Arcelia Jew. I have discussed patient's plan of care with medical resident during this visit. I have confirmed the physical exam findings and have read and agree with the clinic note including the plan with the following addition: - Patient here for follow up. Feeling well currently - On exam- Lungs- CTA b/l, Cardio - RRR, normal heart sounds - S/p levaquin for UTI. Now resolved - Smoking cessation discussed with patient. To set quit date and continue chantix - c/w current BP meds. Patient to bring in home machine to see if the readings are comparable to the ones obtained at clinic

## 2013-10-24 ENCOUNTER — Other Ambulatory Visit: Payer: Self-pay | Admitting: Internal Medicine

## 2013-10-24 ENCOUNTER — Other Ambulatory Visit: Payer: Self-pay | Admitting: Infectious Disease

## 2013-10-27 ENCOUNTER — Other Ambulatory Visit: Payer: Self-pay | Admitting: Internal Medicine

## 2013-11-13 ENCOUNTER — Other Ambulatory Visit: Payer: Self-pay | Admitting: Infectious Disease

## 2013-11-20 ENCOUNTER — Other Ambulatory Visit: Payer: Self-pay | Admitting: *Deleted

## 2013-11-20 ENCOUNTER — Ambulatory Visit (INDEPENDENT_AMBULATORY_CARE_PROVIDER_SITE_OTHER): Payer: Medicare Other | Admitting: Internal Medicine

## 2013-11-20 ENCOUNTER — Encounter: Payer: Self-pay | Admitting: Internal Medicine

## 2013-11-20 VITALS — BP 181/82 | HR 79 | Temp 98.8°F | Wt 145.7 lb

## 2013-11-20 DIAGNOSIS — M5137 Other intervertebral disc degeneration, lumbosacral region: Secondary | ICD-10-CM | POA: Diagnosis not present

## 2013-11-20 DIAGNOSIS — Z0000 Encounter for general adult medical examination without abnormal findings: Secondary | ICD-10-CM

## 2013-11-20 DIAGNOSIS — S43429A Sprain of unspecified rotator cuff capsule, initial encounter: Secondary | ICD-10-CM

## 2013-11-20 DIAGNOSIS — I10 Essential (primary) hypertension: Secondary | ICD-10-CM

## 2013-11-20 DIAGNOSIS — B20 Human immunodeficiency virus [HIV] disease: Secondary | ICD-10-CM

## 2013-11-20 DIAGNOSIS — M5136 Other intervertebral disc degeneration, lumbar region: Secondary | ICD-10-CM

## 2013-11-20 DIAGNOSIS — M75101 Unspecified rotator cuff tear or rupture of right shoulder, not specified as traumatic: Secondary | ICD-10-CM

## 2013-11-20 DIAGNOSIS — F17200 Nicotine dependence, unspecified, uncomplicated: Secondary | ICD-10-CM

## 2013-11-20 DIAGNOSIS — Z23 Encounter for immunization: Secondary | ICD-10-CM | POA: Diagnosis not present

## 2013-11-20 DIAGNOSIS — K5909 Other constipation: Secondary | ICD-10-CM | POA: Diagnosis not present

## 2013-11-20 DIAGNOSIS — F411 Generalized anxiety disorder: Secondary | ICD-10-CM | POA: Diagnosis not present

## 2013-11-20 DIAGNOSIS — Z720 Tobacco use: Secondary | ICD-10-CM

## 2013-11-20 DIAGNOSIS — K5903 Drug induced constipation: Secondary | ICD-10-CM

## 2013-11-20 LAB — BASIC METABOLIC PANEL WITH GFR
BUN: 10 mg/dL (ref 6–23)
CHLORIDE: 114 meq/L — AB (ref 96–112)
CO2: 17 mEq/L — ABNORMAL LOW (ref 19–32)
Calcium: 9.7 mg/dL (ref 8.4–10.5)
Creat: 0.68 mg/dL (ref 0.50–1.10)
GFR, EST NON AFRICAN AMERICAN: 88 mL/min
GFR, Est African American: >89 mL/min
Glucose, Bld: 100 mg/dL — ABNORMAL HIGH (ref 70–99)
Potassium: 3.6 mEq/L (ref 3.5–5.3)
Sodium: 141 mEq/L (ref 135–145)

## 2013-11-20 MED ORDER — ALPRAZOLAM 1 MG PO TABS
1.0000 mg | ORAL_TABLET | Freq: Three times a day (TID) | ORAL | Status: DC | PRN
Start: 2013-11-20 — End: 2014-06-23

## 2013-11-20 MED ORDER — FENTANYL 50 MCG/HR TD PT72: 50.0000 ug | MEDICATED_PATCH | TRANSDERMAL | Status: DC

## 2013-11-20 MED ORDER — EFAVIRENZ-EMTRICITAB-TENOFOVIR 600-200-300 MG PO TABS: ORAL_TABLET | ORAL | Status: DC

## 2013-11-20 MED ORDER — HYDROCODONE-ACETAMINOPHEN 10-325 MG PO TABS: 1.0000 | ORAL_TABLET | Freq: Three times a day (TID) | ORAL | Status: DC | PRN

## 2013-11-20 MED ORDER — VARENICLINE TARTRATE 1 MG PO TABS: 1.0000 mg | ORAL_TABLET | Freq: Two times a day (BID) | ORAL | Status: DC

## 2013-11-20 MED ORDER — SPIRONOLACTONE 25 MG PO TABS: 25.0000 mg | ORAL_TABLET | Freq: Every day | ORAL | Status: DC

## 2013-11-20 NOTE — Progress Notes (Signed)
   Subjective:    Patient ID: Jessica [name], female    DOB: 01-Dec-1940, 73 y.o.   MRN: [id number]  HPI  Please see the A&P for the status of the pt's chronic medical problems.  Review of Systems  Constitutional: Negative for activity change, appetite change and unexpected weight change.  Cardiovascular: Negative for leg swelling.  Gastrointestinal: Positive for constipation. Negative for nausea, vomiting, abdominal pain and diarrhea.  Musculoskeletal: Positive for arthralgias. Negative for gait problem and joint swelling.  Skin: Negative for rash.  Neurological: Negative for syncope and light-headedness.      Objective:   Physical Exam  Nursing note and vitals reviewed. Constitutional: She is oriented to person, place, and time. She appears well-developed and well-nourished. No distress.  HENT:  Head: Normocephalic and atraumatic.  Eyes: Conjunctivae are normal. Right eye exhibits no discharge. Left eye exhibits no discharge. No scleral icterus.  Neck: Neck supple.  Musculoskeletal: Normal range of motion. She exhibits no edema and no tenderness.  Neurological: She is alert and oriented to person, place, and time. She exhibits normal muscle tone.  Skin: Skin is warm and dry. No rash noted. She is not diaphoretic. No erythema.  Psychiatric: She has a normal mood and affect. Her behavior is normal. Judgment and thought content normal.      Assessment & Plan:   Please see problem oriented charting.

## 2013-11-20 NOTE — Assessment & Plan Note (Addendum)
Her blood pressure control remains problematic since stopping the hydrochlorothiazide. She had to stop the hydrochlorothiazide because of associated dizziness with syncope. This is the second time she became symptomatic on this medication and therefore will not be used again. Given her chronic hypokalemia and her dislike of the potassium chloride tablets we will try spironolactone 25 mg by mouth daily for hypertension. This will be added to the amlodipine 10 mg by mouth daily, atenolol 25 mg by mouth daily and benazepril 40 mg by mouth daily for her blood pressure. We will check the blood pressure control at the followup visit on this enhanced regimen. If necessary, we will increase the spironolactone at the followup visit as long as the potassium level allows. A BMP was obtained today to assess the baseline potassium level since she has not been taking sodium bicarbonate or potassium chloride recently. The result is not back at the time of this dictation but will be followed up in responded to appropriately.

## 2013-11-20 NOTE — Assessment & Plan Note (Signed)
The number of cigarettes she is smoking per day as down on the Chantix. She asked that this be renewed. A prescription for another round of Chantix was sent to her pharmacy.

## 2013-11-20 NOTE — Assessment & Plan Note (Signed)
She has noted a return in her right shoulder pain that has been attributed to her right rotator cuff tear versus a bursitis. It has responded to steroid injections in the past. She asked that I reinject the shoulder.  After informed consent was obtained she was prepped in the usual sterile fashion. Freeze spray was used over the point of injection that was determined by palpation of the landmarks. 40 mg of Kenalog and 1 cc of 1% lidocaine was injected into the right shoulder using the lateral approach with a 27-gauge needle. The patient tolerated the procedure well. At the end of the procedure she did not note improvement immediately, but states this is not unusual and that she will feel relief soon.  We will followup on the control of her shoulder pain with the steroid injection at the followup visit. If the pain recurs and it has been at least 3 months since the last injection, we would be happy to provide another steroid injection into the right shoulder.

## 2013-11-20 NOTE — Assessment & Plan Note (Signed)
Her anxiety is reasonably well controlled on the alprazolam 1 mg by mouth every 8 hours as needed for anxiety. We will therefore continue this regimen. The alprazolam was renewed and will be called into the pharmacy.

## 2013-11-20 NOTE — Patient Instructions (Signed)
Great to see you again.  I am sorry for all of your health issues since I last saw you.  I am glad you are feeling better now.  1) We started spironolactone 25 mg daily for your blood pressure and low potassium.  2) We checked your potassium level.  I will call you if we need to make any changes or follow that up.  3) I gave you all of your narcotic prescriptions for the pharmacy for the next 3 months.  4) You got the flu shot today.  5) We injected your right shoulder today to help with the pain.  6) Please keep taking all of your other medications as you have been.  I will see you back in 3 months, sooner if necessary.

## 2013-11-20 NOTE — Assessment & Plan Note (Signed)
She has decent control over her constipation that is related to her narcotic medications. She prefers the milk of magnesia over the sorbitol and asked that the sorbitol be discontinued. Otherwise, we will continue with her constipation regimen.

## 2013-11-20 NOTE — Assessment & Plan Note (Signed)
She is followed closely in the [facility] for Infectious Diseases. She is tolerating her Atripla well. I will defer management of her HIV disease to Dr. Drucilla Schmidt.

## 2013-11-20 NOTE — Assessment & Plan Note (Signed)
She received a flu vaccination today. Although her health maintenance states she's due for a mammogram, it is only been one year and this will be ordered and 2016 when due.

## 2013-11-20 NOTE — Assessment & Plan Note (Signed)
Her low back pain is reasonably well-controlled on the ibuprofen 400 mg by mouth every 8 hours as needed, Narco 10-325 mg one tablet every 8 hours as needed, fentanyl patch 50 mcg per hour every 72 hours, and cyclobenzaprine 10 mg by mouth as needed. We will therefore continue this regimen at these doses. She was given 3 monthly prescriptions for fentanyl and Narco so that she did not need to come to the clinic before taking them to the pharmacy. Her chronic low back pain control will be reassessed at the followup visit.

## 2013-11-21 ENCOUNTER — Ambulatory Visit: Payer: Medicare Other | Admitting: Internal Medicine

## 2013-11-24 ENCOUNTER — Other Ambulatory Visit: Payer: Self-pay | Admitting: Internal Medicine

## 2013-11-24 DIAGNOSIS — B20 Human immunodeficiency virus [HIV] disease: Secondary | ICD-10-CM

## 2013-11-27 ENCOUNTER — Other Ambulatory Visit: Payer: Self-pay | Admitting: Internal Medicine

## 2013-11-27 NOTE — Progress Notes (Signed)
K 3.6 (off of potassium) Cr 0.68 HCO3 17 Cl 114 AG 10  Spironolactone started at the visit and potassium was not being taken, but was also formally discontinued.  Will follow-up BMP at return visit while on the spironolactone for the HTN.

## 2013-12-12 ENCOUNTER — Encounter: Payer: Medicare Other | Admitting: Internal Medicine

## 2013-12-15 ENCOUNTER — Other Ambulatory Visit: Payer: Self-pay | Admitting: Infectious Disease

## 2013-12-15 DIAGNOSIS — A6000 Herpesviral infection of urogenital system, unspecified: Secondary | ICD-10-CM

## 2013-12-23 ENCOUNTER — Other Ambulatory Visit: Payer: Self-pay | Admitting: Infectious Disease

## 2014-01-10 ENCOUNTER — Other Ambulatory Visit: Payer: Self-pay | Admitting: Internal Medicine

## 2014-01-10 ENCOUNTER — Other Ambulatory Visit: Payer: Self-pay | Admitting: Infectious Disease

## 2014-01-10 DIAGNOSIS — Z720 Tobacco use: Secondary | ICD-10-CM

## 2014-01-10 DIAGNOSIS — A6000 Herpesviral infection of urogenital system, unspecified: Secondary | ICD-10-CM

## 2014-01-12 ENCOUNTER — Encounter: Payer: Self-pay | Admitting: Internal Medicine

## 2014-01-26 ENCOUNTER — Other Ambulatory Visit: Payer: Self-pay | Admitting: Internal Medicine

## 2014-01-26 ENCOUNTER — Other Ambulatory Visit: Payer: Self-pay | Admitting: Infectious Diseases

## 2014-01-26 ENCOUNTER — Other Ambulatory Visit: Payer: Self-pay | Admitting: Infectious Disease

## 2014-01-26 DIAGNOSIS — E785 Hyperlipidemia, unspecified: Secondary | ICD-10-CM

## 2014-02-16 ENCOUNTER — Other Ambulatory Visit: Payer: Medicare Other

## 2014-02-16 DIAGNOSIS — B029 Zoster without complications: Secondary | ICD-10-CM | POA: Diagnosis not present

## 2014-02-16 DIAGNOSIS — J329 Chronic sinusitis, unspecified: Secondary | ICD-10-CM | POA: Diagnosis not present

## 2014-02-16 DIAGNOSIS — B20 Human immunodeficiency virus [HIV] disease: Secondary | ICD-10-CM

## 2014-02-16 LAB — CBC WITH DIFFERENTIAL/PLATELET
BASOS ABS: 0.1 10*3/uL (ref 0.0–0.1)
Basophils Relative: 1 % (ref 0–1)
EOS ABS: 0.2 10*3/uL (ref 0.0–0.7)
EOS PCT: 3 % (ref 0–5)
HCT: 38.1 % (ref 36.0–46.0)
Hemoglobin: 13.7 g/dL (ref 12.0–15.0)
Lymphocytes Relative: 65 % — ABNORMAL HIGH (ref 12–46)
Lymphs Abs: 5.1 10*3/uL — ABNORMAL HIGH (ref 0.7–4.0)
MCH: 33.6 pg (ref 26.0–34.0)
MCHC: 36 g/dL (ref 30.0–36.0)
MCV: 93.4 fL (ref 78.0–100.0)
MPV: 8.8 fL — AB (ref 9.4–12.4)
Monocytes Absolute: 0.2 10*3/uL (ref 0.1–1.0)
Monocytes Relative: 3 % (ref 3–12)
Neutro Abs: 2.2 10*3/uL (ref 1.7–7.7)
Neutrophils Relative %: 28 % — ABNORMAL LOW (ref 43–77)
PLATELETS: 356 10*3/uL (ref 150–400)
RBC: 4.08 MIL/uL (ref 3.87–5.11)
RDW: 13.8 % (ref 11.5–15.5)
WBC: 7.9 10*3/uL (ref 4.0–10.5)

## 2014-02-16 LAB — RPR

## 2014-02-16 LAB — COMPLETE METABOLIC PANEL WITH GFR
ALT: 14 U/L (ref 0–35)
AST: 18 U/L (ref 0–37)
Albumin: 4.3 g/dL (ref 3.5–5.2)
Alkaline Phosphatase: 51 U/L (ref 39–117)
BUN: 12 mg/dL (ref 6–23)
CO2: 18 meq/L — AB (ref 19–32)
CREATININE: 0.75 mg/dL (ref 0.50–1.10)
Calcium: 9.6 mg/dL (ref 8.4–10.5)
Chloride: 115 mEq/L — ABNORMAL HIGH (ref 96–112)
GFR, EST NON AFRICAN AMERICAN: 80 mL/min
GFR, Est African American: >89 mL/min
Glucose, Bld: 86 mg/dL (ref 70–99)
Potassium: 4.1 mEq/L (ref 3.5–5.3)
Sodium: 140 mEq/L (ref 135–145)
Total Bilirubin: 0.4 mg/dL (ref 0.2–1.2)
Total Protein: 7.4 g/dL (ref 6.0–8.3)

## 2014-02-17 LAB — MICROALBUMIN / CREATININE URINE RATIO
Creatinine, Urine: 140.2 mg/dL
Microalb Creat Ratio: 19.3 mg/g (ref 0.0–30.0)
Microalb, Ur: 2.7 mg/dL — ABNORMAL HIGH (ref <2.0)

## 2014-02-17 LAB — HIV-1 RNA QUANT-NO REFLEX-BLD: HIV-1 RNA Quant, Log: <1.3 {Log} (ref <1.30)

## 2014-02-17 LAB — T-HELPER CELL (CD4) - (RCID CLINIC ONLY)
CD4 % Helper T Cell: 41 % (ref 33–55)
CD4 T CELL ABS: 2350 /uL (ref 400–2700)

## 2014-02-20 ENCOUNTER — Ambulatory Visit (INDEPENDENT_AMBULATORY_CARE_PROVIDER_SITE_OTHER): Payer: Medicare Other | Admitting: Internal Medicine

## 2014-02-20 ENCOUNTER — Encounter: Payer: Self-pay | Admitting: Internal Medicine

## 2014-02-20 VITALS — BP 163/88 | HR 72 | Temp 98.2°F | Wt 150.3 lb

## 2014-02-20 DIAGNOSIS — F17210 Nicotine dependence, cigarettes, uncomplicated: Secondary | ICD-10-CM | POA: Diagnosis not present

## 2014-02-20 DIAGNOSIS — Z1231 Encounter for screening mammogram for malignant neoplasm of breast: Secondary | ICD-10-CM

## 2014-02-20 DIAGNOSIS — B20 Human immunodeficiency virus [HIV] disease: Secondary | ICD-10-CM | POA: Diagnosis not present

## 2014-02-20 DIAGNOSIS — K5903 Drug induced constipation: Secondary | ICD-10-CM

## 2014-02-20 DIAGNOSIS — M5136 Other intervertebral disc degeneration, lumbar region: Secondary | ICD-10-CM

## 2014-02-20 DIAGNOSIS — F411 Generalized anxiety disorder: Secondary | ICD-10-CM

## 2014-02-20 DIAGNOSIS — K5909 Other constipation: Secondary | ICD-10-CM | POA: Diagnosis not present

## 2014-02-20 DIAGNOSIS — I10 Essential (primary) hypertension: Secondary | ICD-10-CM | POA: Diagnosis not present

## 2014-02-20 DIAGNOSIS — Z720 Tobacco use: Secondary | ICD-10-CM

## 2014-02-20 DIAGNOSIS — A6000 Herpesviral infection of urogenital system, unspecified: Secondary | ICD-10-CM

## 2014-02-20 DIAGNOSIS — T50905D Adverse effect of unspecified drugs, medicaments and biological substances, subsequent encounter: Secondary | ICD-10-CM | POA: Diagnosis not present

## 2014-02-20 MED ORDER — SPIRONOLACTONE 50 MG PO TABS: 50.0000 mg | ORAL_TABLET | Freq: Every day | ORAL | Status: DC

## 2014-02-20 MED ORDER — HYDROCODONE-ACETAMINOPHEN 10-325 MG PO TABS: 1.0000 | ORAL_TABLET | Freq: Three times a day (TID) | ORAL | Status: DC | PRN

## 2014-02-20 MED ORDER — FENTANYL 50 MCG/HR TD PT72: 50.0000 ug | MEDICATED_PATCH | TRANSDERMAL | Status: DC

## 2014-02-20 MED ORDER — SORBITOL 70 % PO SOLN: 15.0000 mL | Freq: Every day | ORAL | Status: DC | PRN

## 2014-02-20 MED ORDER — NICOTINE POLACRILEX 4 MG MT GUM: 4.0000 mg | CHEWING_GUM | OROMUCOSAL | Status: DC | PRN

## 2014-02-20 NOTE — Assessment & Plan Note (Signed)
She is followed closely by Dr. Drucilla Schmidt in the [facility] for Infectious Diseases where her HIV infection is managed. I will defer continued management to him.

## 2014-02-20 NOTE — Assessment & Plan Note (Signed)
She has had some strange behavior around the time of waking up since restarting the Chantix. This includes talking to her brother who is long since died. I believe this is likely secondary to the Chantix which is known to cause vivid dreams. We therefore mutually decided to stop the Chantix and start nicotine gum 4 mg by mouth as needed for nicotine cravings. We discussed that she is to take the gum when she has a craving and chew it until she gets tingling in her mouth. She is then to park the gum between her teeth and cheek until she had another craving at which point she is to again chew it until she gets the tingling sensation. If she has no tingling feeling after chewing the gum it is a used piece and to be discarded. We will reassess the success of her attempts at tobacco cessation on the nicotine gum at the follow-up visit.

## 2014-02-20 NOTE — Patient Instructions (Signed)
It was good to see you today.  I hope you have a happy holiday season.  1) Stop the Chantix as I think it may be effecting your mind.  I prescribed nicotine gum 4 mg.  When you get a craving, chew a piece until you feel tingling, then park it in your cheek.  If you get another craving, chew it some more until you get the tingling.  When you chew the gum and do not get any more tingling then you can throw that piece out.  2) We increased the spironolactone to 50 mg daily for your blood pressure and potassium.  3) Keep taking all of the other medications as you are.  I will see you back in 3 months, sooner if necessary.

## 2014-02-20 NOTE — Progress Notes (Signed)
   Subjective:    Patient ID: Jessica [name], female    DOB: Jan 20, 1941, 73 y.o.   MRN: [id number]  HPI  Please see the A&P for the status of the pt's chronic medical problems.  Review of Systems  Constitutional: Negative for fever, activity change, appetite change, fatigue and unexpected weight change.  Respiratory: Negative for chest tightness and shortness of breath.   Cardiovascular: Negative for chest pain, palpitations and leg swelling.  Gastrointestinal: Positive for constipation. Negative for nausea, vomiting, abdominal pain, diarrhea and abdominal distention.  Musculoskeletal: Positive for back pain.  Skin: Positive for color change, rash and wound.  Neurological: Positive for light-headedness. Negative for syncope and weakness.  Psychiatric/Behavioral: Positive for confusion.      Objective:   Physical Exam  Constitutional: She is oriented to person, place, and time. She appears well-developed and well-nourished. No distress.  HENT:  Head: Normocephalic and atraumatic.  Eyes: Conjunctivae are normal. Right eye exhibits no discharge. Left eye exhibits no discharge. No scleral icterus.  Cardiovascular: Normal rate and regular rhythm.  Exam reveals no gallop and no friction rub.   No murmur heard. Pulmonary/Chest: Effort normal and breath sounds normal. No respiratory distress. She has no wheezes. She has no rales.  Abdominal: Soft. Bowel sounds are normal. She exhibits no distension. There is no tenderness. There is no rebound and no guarding.  Musculoskeletal: Normal range of motion. She exhibits no edema or tenderness.  Neurological: She is alert and oriented to person, place, and time. She exhibits normal muscle tone.  Skin: Skin is warm and dry. Rash noted. She is not diaphoretic. There is erythema.  RUE with eyrthema and mild swelling with ? papules c/w a contact dermatitis.  Psychiatric: She has a normal mood and affect. Her behavior is normal. Judgment and  thought content normal.  Nursing note and vitals reviewed.     Assessment & Plan:   Please see problem oriented charting.

## 2014-02-20 NOTE — Assessment & Plan Note (Signed)
Her back pain is reasonably well controlled on the cyclobenzaprine 10 mg by mouth as needed, fentanyl patch 50 g every 3 days, Narco 10-325 one tablet every 8 hours as needed for severe pain, and ibuprofen or 100 mg by mouth every 8 hours as needed for moderate pain. We will continue with this regimen. She was provided with 3 months of the Narco prescriptions so she would not have to return to clinic each month to pick the prescription up.

## 2014-02-20 NOTE — Assessment & Plan Note (Signed)
She continues to have constipation related to her chronic narcotic use. This responds to the Colace, milk of magnesia, and as needed sorbitol. This regimen will therefore be continued and her response will be reassessed at the follow-up visit.

## 2014-02-20 NOTE — Assessment & Plan Note (Signed)
Her generalized anxiety disorder is well controlled on the alprazolam 1 mg by mouth every 8 hours as needed for anxiety. This will therefore be continued and her generalized anxiety disorder symptoms will be reassessed at the follow-up visit.

## 2014-02-20 NOTE — Assessment & Plan Note (Signed)
Her blood pressure remains elevated today at 163/88 despite the addition of spironolactone 25 mg by mouth daily at the last visit. This is on amlodipine 10 mg by mouth daily, benazepril 40 mg by mouth daily, atenolol 25 mg by mouth daily, and spironolactone 25 mg by mouth daily. Given the recent study published in the Lancet regarding the addition of a fourth agent, specifically spironolactone being superior to other agents, we decided to increase the spironolactone dose to 50 mg by mouth daily and continue the other antihypertensives at the current dose. Of note, in the Lancet study one of the drugs was hydrochlorothiazide or another equivalent thiazide diuretic. We are unable to provide this medication for her as on 2 separate occasions she has developed dizziness on this medication that resolved with discontinuation and recurred with rechallenge. We will reassess the blood pressure and the BMP at the follow-up visit on the increased dose of the spironolactone.

## 2014-02-20 NOTE — Assessment & Plan Note (Signed)
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 As this is effective therapy will be continued.

## 2014-03-02 ENCOUNTER — Other Ambulatory Visit: Payer: Self-pay | Admitting: Internal Medicine

## 2014-03-02 ENCOUNTER — Ambulatory Visit (INDEPENDENT_AMBULATORY_CARE_PROVIDER_SITE_OTHER): Payer: Medicare Other | Admitting: Infectious Disease

## 2014-03-02 ENCOUNTER — Encounter: Payer: Self-pay | Admitting: Infectious Disease

## 2014-03-02 VITALS — BP 187/91 | HR 78 | Temp 98.2°F | Wt 151.0 lb

## 2014-03-02 DIAGNOSIS — Z113 Encounter for screening for infections with a predominantly sexual mode of transmission: Secondary | ICD-10-CM | POA: Diagnosis not present

## 2014-03-02 DIAGNOSIS — B20 Human immunodeficiency virus [HIV] disease: Secondary | ICD-10-CM

## 2014-03-02 DIAGNOSIS — I10 Essential (primary) hypertension: Secondary | ICD-10-CM | POA: Diagnosis not present

## 2014-03-02 DIAGNOSIS — E785 Hyperlipidemia, unspecified: Secondary | ICD-10-CM | POA: Diagnosis not present

## 2014-03-02 NOTE — Progress Notes (Signed)
  Subjective:    Patient ID: Jessica [name], female    DOB: 1941/01/25, 73 y.o.   MRN: [id number]  HPI   Jessica [name] is a 73 y.o. female with HIV infection who is doing superbly well on her antiviral regimen, Atripla with undetectable viral load and health cd4 count.   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  We discussed changing over to a TAF-truvada based regimen such as GENVOYA vs R-TAF COMPLERA vs TIVICAY and Truvada.       Review of Systems  Constitutional: Negative for fever, chills, diaphoresis, activity change, appetite change, fatigue and unexpected weight change.  HENT: Negative for congestion, ear pain, rhinorrhea, sinus pressure, sneezing, sore throat and trouble swallowing.   Eyes: Negative for photophobia and visual disturbance.  Respiratory: Negative for cough, chest tightness, shortness of breath, wheezing and stridor.   Cardiovascular: Negative for chest pain, palpitations and leg swelling.  Gastrointestinal: Negative for nausea, vomiting, abdominal pain, diarrhea, constipation, blood in stool, abdominal distention and anal bleeding.  Genitourinary: Negative for dysuria, hematuria, flank pain and difficulty urinating.  Musculoskeletal: Negative for myalgias, back pain, joint swelling, arthralgias and gait problem.  Skin: Negative for color change, pallor and wound.  Neurological: Negative for dizziness, tremors, weakness and light-headedness.  Hematological: Negative for adenopathy. Does not bruise/bleed easily.  Psychiatric/Behavioral: Negative for behavioral problems, confusion, sleep disturbance, dysphoric mood, decreased concentration and agitation.       Objective:   Physical Exam  Constitutional: She is oriented to person, place, and time.  She appears well-developed and well-nourished. No distress.  HENT:  Head: Normocephalic and atraumatic.  Mouth/Throat: No oropharyngeal exudate.  Eyes: Conjunctivae and EOM are normal. No scleral icterus.  Neck: Normal range of motion. Neck supple.  Cardiovascular: Normal rate and regular rhythm.   Pulmonary/Chest: Effort normal. No respiratory distress. She has no wheezes.  Abdominal: She exhibits no distension.  Musculoskeletal: She exhibits no edema or tenderness.  Neurological: She is alert and oriented to person, place, and time. She exhibits normal muscle tone. Coordination normal.  Skin: Skin is warm and dry. No rash noted. She is not diaphoretic. No erythema. No pallor.  Psychiatric: She has a normal mood and affect. Her behavior is normal. Judgment and thought content normal.          Assessment & Plan:   HIV: Perfect control continue current antiretroviral regimen. SHE NEEDS SPAP renewal for March thru Sept, change to TAF based regimen down the road. I spent greater than 25 minutes with the patient including greater than 50% of time in face to face counsel of the patient and in coordination of their care.  HTN:fu with Dr. Eppie Gibson

## 2014-03-16 ENCOUNTER — Ambulatory Visit: Payer: Medicare Other

## 2014-03-17 ENCOUNTER — Other Ambulatory Visit: Payer: Self-pay | Admitting: *Deleted

## 2014-03-17 DIAGNOSIS — B20 Human immunodeficiency virus [HIV] disease: Secondary | ICD-10-CM

## 2014-03-17 MED ORDER — EFAVIRENZ-EMTRICITAB-TENOFOVIR 600-200-300 MG PO TABS
ORAL_TABLET | ORAL | Status: DC
Start: 2014-03-17 — End: 2014-06-17

## 2014-03-19 DIAGNOSIS — H4011X1 Primary open-angle glaucoma, mild stage: Secondary | ICD-10-CM | POA: Diagnosis not present

## 2014-03-23 ENCOUNTER — Other Ambulatory Visit: Payer: Self-pay | Admitting: Internal Medicine

## 2014-03-23 DIAGNOSIS — B20 Human immunodeficiency virus [HIV] disease: Secondary | ICD-10-CM

## 2014-03-30 ENCOUNTER — Ambulatory Visit (HOSPITAL_COMMUNITY)
Admission: RE | Admit: 2014-03-30 | Discharge: 2014-03-30 | Disposition: A | Discharge status: Home / Self Care | Payer: Medicare Other | Source: Ambulatory Visit | Attending: Internal Medicine | Admitting: Internal Medicine

## 2014-03-30 DIAGNOSIS — Z1231 Encounter for screening mammogram for malignant neoplasm of breast: Secondary | ICD-10-CM | POA: Diagnosis present

## 2014-04-02 DIAGNOSIS — R42 Dizziness and giddiness: Secondary | ICD-10-CM | POA: Diagnosis not present

## 2014-04-02 DIAGNOSIS — R404 Transient alteration of awareness: Secondary | ICD-10-CM | POA: Diagnosis not present

## 2014-04-22 ENCOUNTER — Encounter: Payer: Self-pay | Admitting: *Deleted

## 2014-04-24 ENCOUNTER — Other Ambulatory Visit: Payer: Self-pay | Admitting: Internal Medicine

## 2014-04-24 DIAGNOSIS — I10 Essential (primary) hypertension: Secondary | ICD-10-CM

## 2014-04-24 MED ORDER — SPIRONOLACTONE 50 MG PO TABS: 50.0000 mg | ORAL_TABLET | Freq: Every day | ORAL | Status: DC

## 2014-05-18 ENCOUNTER — Encounter: Payer: Self-pay | Admitting: Internal Medicine

## 2014-05-18 ENCOUNTER — Ambulatory Visit (INDEPENDENT_AMBULATORY_CARE_PROVIDER_SITE_OTHER): Payer: Medicare Other | Admitting: Internal Medicine

## 2014-05-18 VITALS — BP 142/58 | HR 81 | Temp 99.0°F | Wt 146.6 lb

## 2014-05-18 DIAGNOSIS — M5136 Other intervertebral disc degeneration, lumbar region: Secondary | ICD-10-CM | POA: Diagnosis not present

## 2014-05-18 DIAGNOSIS — K5903 Drug induced constipation: Secondary | ICD-10-CM

## 2014-05-18 DIAGNOSIS — I10 Essential (primary) hypertension: Secondary | ICD-10-CM

## 2014-05-18 DIAGNOSIS — Z720 Tobacco use: Secondary | ICD-10-CM

## 2014-05-18 DIAGNOSIS — H9313 Tinnitus, bilateral: Secondary | ICD-10-CM

## 2014-05-18 DIAGNOSIS — K5909 Other constipation: Secondary | ICD-10-CM | POA: Diagnosis not present

## 2014-05-18 DIAGNOSIS — B20 Human immunodeficiency virus [HIV] disease: Secondary | ICD-10-CM

## 2014-05-18 DIAGNOSIS — Z0000 Encounter for general adult medical examination without abnormal findings: Secondary | ICD-10-CM | POA: Diagnosis not present

## 2014-05-18 DIAGNOSIS — T50905A Adverse effect of unspecified drugs, medicaments and biological substances, initial encounter: Secondary | ICD-10-CM | POA: Diagnosis not present

## 2014-05-18 DIAGNOSIS — K429 Umbilical hernia without obstruction or gangrene: Secondary | ICD-10-CM

## 2014-05-18 HISTORY — DX: Umbilical hernia without obstruction or gangrene: K42.9

## 2014-05-18 HISTORY — DX: Tinnitus, bilateral: H93.13

## 2014-05-18 LAB — BASIC METABOLIC PANEL WITH GFR
BUN: 11 mg/dL (ref 6–23)
CALCIUM: 9.9 mg/dL (ref 8.4–10.5)
CHLORIDE: 111 meq/L (ref 96–112)
CO2: 17 meq/L — AB (ref 19–32)
Creat: 0.79 mg/dL (ref 0.50–1.10)
GFR, EST AFRICAN AMERICAN: 87 mL/min
GFR, Est Non African American: 76 mL/min
Glucose, Bld: 85 mg/dL (ref 70–99)
Potassium: 4.1 mEq/L (ref 3.5–5.3)
Sodium: 140 mEq/L (ref 135–145)

## 2014-05-18 MED ORDER — FENTANYL 50 MCG/HR TD PT72: 50.0000 ug | MEDICATED_PATCH | TRANSDERMAL | Status: DC

## 2014-05-18 MED ORDER — HYDROCODONE-ACETAMINOPHEN 10-325 MG PO TABS: 1.0000 | ORAL_TABLET | Freq: Three times a day (TID) | ORAL | Status: DC | PRN

## 2014-05-18 NOTE — Progress Notes (Signed)
   Subjective:    Patient ID: Jessica [name], female    DOB: October 28, 1940, 74 y.o.   MRN: [id number]  HPI  Please see the A&P for the status of the pt's chronic medical problems.  Review of Systems  Constitutional: Negative for activity change, appetite change and unexpected weight change.  HENT: Positive for tinnitus. Negative for ear discharge and ear pain.   Cardiovascular: Negative for leg swelling.  Gastrointestinal: Positive for abdominal pain and constipation. Negative for nausea, vomiting and diarrhea.  Musculoskeletal: Positive for back pain.  Skin: Positive for color change.  Neurological: Positive for dizziness and light-headedness. Negative for syncope and headaches.      Objective:   Physical Exam  Constitutional: She is oriented to person, place, and time. She appears well-developed and well-nourished. No distress.  HENT:  Head: Normocephalic and atraumatic.  Right Ear: Tympanic membrane, external ear and ear canal normal. No lacerations. No foreign bodies. Tympanic membrane is not injected, not scarred, not perforated, not erythematous, not retracted and not bulging. No middle ear effusion. No hemotympanum.  Left Ear: Tympanic membrane, external ear and ear canal normal. No lacerations. No foreign bodies. Tympanic membrane is not injected, not scarred, not perforated, not erythematous, not retracted and not bulging.  No middle ear effusion. No hemotympanum.  Eyes: Conjunctivae and EOM are normal. Pupils are equal, round, and reactive to light. Right eye exhibits no discharge. Left eye exhibits no discharge. No scleral icterus.  Neck: Normal range of motion. Neck supple.  Cardiovascular: Normal rate, regular rhythm and normal heart sounds.  Exam reveals no gallop and no friction rub.   No murmur heard. Pulmonary/Chest: Effort normal and breath sounds normal. No respiratory distress. She has no wheezes. She has no rales.  Abdominal: Soft. Bowel sounds are normal. She  exhibits no distension. There is no tenderness. There is no rebound and no guarding.  Approximately 1 cm left periumbilical abdominal wall defect.  No hernia currently.  Musculoskeletal: Normal range of motion. She exhibits no edema or tenderness.  Neurological: She is alert and oriented to person, place, and time. She exhibits normal muscle tone.  Dix-Hallpike maneuver negative bilaterally.  Skin: Skin is warm and dry. No rash noted. She is not diaphoretic. No erythema.  Psychiatric: She has a normal mood and affect. Her behavior is normal. Judgment and thought content normal.  Nursing note and vitals reviewed.     Assessment & Plan:   Please see problem oriented charting.

## 2014-05-18 NOTE — Assessment & Plan Note (Signed)
She follows closely with the [facility] for Infectious Diseases. Her HIV is extremely well controlled on her current regimen. I will continue to defer management of her HIV to her infectious disease specialists.

## 2014-05-18 NOTE — Patient Instructions (Signed)
It was great to see you again.  You are doing a nice job with your health.  1)  Keep taking the medications as you are.  2) I am sending you for an ultrasound to look at your lower abdominal hernia we felt on examination.  3) I am sending you to audiology to do some hearing testing given the noises you are hearing in both ears.  4) We checked you kidney function and blood salt levels this morning.  I will call next week if there are any concerns.  5) We gave you the pneumovax 13 vaccination today.  6)  I will see you back in 3 months, sooner if necessary.

## 2014-05-18 NOTE — Assessment & Plan Note (Signed)
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 Nonetheless, she has a regimen that she manages and includes Colace, Milk of Magnesia, and sorbitol. She will continue to adjust as she feels fit in order to maintain normal bowel habits.

## 2014-05-18 NOTE — Assessment & Plan Note (Signed)
Her blood pressure was 142/58 after sitting in the office for 20 minutes. It was even lower when we did orthostatic blood pressure and pulse. This is on amlodipine 10 mg by mouth daily, atenolol 25 mg by mouth daily, benazepril 40 mg by mouth daily, and spironolactone 50 mg by mouth daily. We will continue this regimen and check a basic metabolic panel today to assess for hyperkalemia with the increased dose of spironolactone at the last visit.

## 2014-05-18 NOTE — Assessment & Plan Note (Signed)
She received the Pneumovax 13 today. She is otherwise up-to-date on her health care maintenance.

## 2014-05-18 NOTE — Assessment & Plan Note (Signed)
Her chronic back pain is well-controlled on the current doses of fentanyl, hydrocodone, ibuprofen, and cyclobenzaprine. We will therefore continue with this regimen.

## 2014-05-18 NOTE — Assessment & Plan Note (Signed)
Apparently her insurance company would not cover the nicotine gum. She therefore was unable to try it. She has heard of another medication, but could not tell me off the top of her head what it was. She will let me know when she finds out so I can look into it.

## 2014-05-18 NOTE — Assessment & Plan Note (Signed)
In early February she noted bulging in an area just left of her umbilicus near her previous surgical scars. At that time she was constipated and when she did have a bowel movement it felt like it was attached to her innerbody. She would lie down and rub the area and eventually the bulging disappeared. Examination confirmed the presence of a 1 cm defect in the abdominal wall in the left paraumbilical area near her surgical scar. I suspect she may have had a strangulated hernia in this area which eventually was reduced by the patient. She's had no problems over the last month with similar signs or symptoms. We discussed options including surgical evaluation. We decided upon further evaluation with an ultrasound of the abdominal area to confirm the presence of an abdominal wall defect. If there are no further problems she would prefer to leave this alone. If she were to have recurrence of her symptoms she would then let us know so we could refer her for surgical intervention.

## 2014-05-18 NOTE — Assessment & Plan Note (Signed)
In late January she had an episode of a fall which was preceded by subjective tinnitus that progressively got louder and then the sensation of vertigo. She's had no further episodes of fall related to this although she continues to have subjective tinnitus. Examination of both ears reveal a normal external ear canal and tympanic membrane bilaterally. A Dix-Hallpike maneuver done bilaterally was without evidence of nystagmus suggesting benign positional vertigo. Orthostatic blood pressures were also unremarkable when going from lying to standing. The differential continues to be benign positional vertigo versus some other inner ear condition. We will start with audiometry to assess the bilateral subjective tinnitus. If she continues to have vertigo despite a negative Dix-Hallpike maneuver we may refer her for vestibular rehabilitation as the Dix-Hallpike is not 100% sensitive.

## 2014-05-22 ENCOUNTER — Ambulatory Visit: Payer: Medicare Other | Admitting: Internal Medicine

## 2014-05-22 ENCOUNTER — Other Ambulatory Visit: Payer: Self-pay | Admitting: Infectious Disease

## 2014-05-22 ENCOUNTER — Other Ambulatory Visit: Payer: Self-pay | Admitting: Internal Medicine

## 2014-05-24 ENCOUNTER — Other Ambulatory Visit: Payer: Self-pay | Admitting: Internal Medicine

## 2014-05-24 DIAGNOSIS — M810 Age-related osteoporosis without current pathological fracture: Secondary | ICD-10-CM

## 2014-05-27 NOTE — Progress Notes (Signed)
BMP: Unremarkable  Will continue with current care including the increased dose of spironolactone which has not resulted in hyperkalemia.  Attempted to call both home and cell numbers but received unidentified answering machines.  No message left.

## 2014-05-29 ENCOUNTER — Ambulatory Visit (HOSPITAL_COMMUNITY)
Admission: RE | Admit: 2014-05-29 | Discharge: 2014-05-29 | Disposition: A | Discharge status: Home / Self Care | Payer: Medicare Other | Source: Ambulatory Visit | Attending: Internal Medicine | Admitting: Internal Medicine

## 2014-05-29 ENCOUNTER — Ambulatory Visit: Payer: Medicare Other

## 2014-05-29 DIAGNOSIS — K429 Umbilical hernia without obstruction or gangrene: Secondary | ICD-10-CM | POA: Insufficient documentation

## 2014-05-29 DIAGNOSIS — H903 Sensorineural hearing loss, bilateral: Secondary | ICD-10-CM

## 2014-05-29 HISTORY — DX: Sensorineural hearing loss, bilateral: H90.3

## 2014-06-10 ENCOUNTER — Ambulatory Visit: Payer: Medicare Other | Attending: Audiology | Admitting: Audiology

## 2014-06-10 DIAGNOSIS — Z87898 Personal history of other specified conditions: Secondary | ICD-10-CM

## 2014-06-10 DIAGNOSIS — Z8669 Personal history of other diseases of the nervous system and sense organs: Secondary | ICD-10-CM | POA: Insufficient documentation

## 2014-06-10 DIAGNOSIS — H903 Sensorineural hearing loss, bilateral: Secondary | ICD-10-CM | POA: Insufficient documentation

## 2014-06-10 DIAGNOSIS — H748X3 Other specified disorders of middle ear and mastoid, bilateral: Secondary | ICD-10-CM | POA: Insufficient documentation

## 2014-06-10 NOTE — Procedures (Signed)
[facility] [address] [place], [place] [address] [place] EVALUATION  Name: Jessica [name] DOB:  August 26, 1940 MRN:  [id number]                                 Diagnosis: Vertigo, tinnitus, hearing loss Date: 06/10/2014    Referent: Karren Cobble, MD  HISTORY: Jessica [name], age 74 y.o. years, was seen for an audiological evaluation. She states that although she has "never had a hearing test before" she is aware of a gradual hearing loss with word understanding difficulty in both ears.  Her primary concern is the constant tinnitus that sounds like "crickets", that fluctuates.  In addition, Jessica [name] states that for the past 2-3 years she "is a little off balance all of the [name]". In the past year she reports two episodes of sudden vertigo that resulted in a fall - the episodes starts with very loud tinnitus with sudden spinning to the right. The first episode on Jul 27, 2013 lasted "about 15 minutes and resulted in "a bad fall". The second episode was on April 11, 2014 with the same loud tinnitus, spinning and fall, but "it wasn't as bad".  Jessica [name] is concerned about her balance and  fall risk. Jessica [name] also reports that the "tinnitus" is sometimes difficult to deal with and makes "depression worse".  There is a history of surgery with "aorta-artery bipass in 04/2007; small bowel obstruction 2011; bowel adhesion removal 2012.     EVALUATION: Pure tone air and tone conduction was completed using conventional audiometry with inserts.  Right ear hearing thresholds show a sensorineural hearing loss of 25-30 dBHL from 250Hz  - 8000Hz .  Left ear hearing thresholds show a sensorineural hearing loss of 50 dBHL at 250Hz ; 30 dBHL from 500Hz  - 2000Hz ; 40 dBHL at 4000Hz  and 55 dBHL at 8000Hz .Speech reception thresholds are 25 dBHL in the right ear and 35 dBHL in the left ear using recorded spondee words.  The  reliability is good. Word recognition is 96% at 55dBHL in the right and 96% at 60dBHL in the left using recorded NU-6 word lists in quiet. In minimal background noise with +5dB signal to noise ratio word recognition is 68 % in the right ear and 60% in the left ear using recorded PBK word lists. Otoscopic inspection reveals clear ear canals with visible tympanic membranes.  Tympanometry showed shallow tympanic membrane compliance bilaterally (Type As) with absent ipsilateral acoustic reflexes.  Tinnitus matching was reported to sound like speech noise at 37 dBHL bilaterally.  There is no suppression.  CONCLUSION:      Jessica [name] has a bilateral sensorineural hearing loss that is mild to moderate on the left side and slight to mild on the right side. She has excellent word recognition in quiet that drops to poor in minimal background noise bilaterally. The tinnitus, which is "better than usual today" is about twice as loud as her mid frequency hearing thresholds. Jessica [name] states that the tinnitus adversely affects her mood and that when it is louder speech perception becomes  "static-like" and she isolates herself from others because she can't hear.  The balance and vertigo are also of concern.  Further evaluation by an ENT for the bilateral hearing loss, tinnitus and vertigo is strongly recommended.  Following medical clearance, a hearing aid evaluation, possibly with a tinnitus  masker is recommended.  In addition, Jessica [name] needs a balance assessment from a physical therapist such as at the [facility] [address] in [place] [phone]).  RECOMMENDATIONS: 1.   Further evaluation of the tinnitus, vertigo and hearing loss by an Ear, Nose and Throat physician.  Following medical clearance a hearing aid evaluation with consideration of tinnitus masking.  2. Since Jessica [name] has fallen and feels unsteady, a balance assessment by  physical therapists such as at the [facility] in [place] is recommended. 3.   To minimize the adverse effects of tinnitus 1) avoid quiet  2) use noise maskers at home such as a sound machine, quiet music, a fan or other background noise at a volume just loud enough to mask the high pitched tinnitus. 3) If the tinnitus becomes more bothersome, adversely affecting your sleep, concentration or mood, please inform your primary physician and/or ENT.   [name], Au.D., CCC-A Doctor of Audiology   06/10/2014  cc: Karren Cobble, MD

## 2014-06-11 ENCOUNTER — Encounter (HOSPITAL_COMMUNITY): Payer: Self-pay

## 2014-06-11 DIAGNOSIS — H903 Sensorineural hearing loss, bilateral: Secondary | ICD-10-CM

## 2014-06-11 HISTORY — DX: Sensorineural hearing loss, bilateral: H90.3

## 2014-06-11 NOTE — Progress Notes (Signed)
I called [name] to discuss the following results:  Abdominal ultrasound: No evidence of hernia.  Otherwise unremarkable.  Still having chronic discomfort.  We will reassess abdominal pain and examination at the follow-up visit and consider further evaluation if appropriate, which may include a CT scan of the abdomen/pelvis.  Audiology: Bilateral mild to moderate sensorineural hearing loss:  They recommended "medical clearance" by ENT prior to hearing aides with possible masking for the tinnitus.  She wishes to defer this at this time secondary to her financial situation.  We will readdress it at the follow-up visit.  They also recommended neurorehabilitation given the 2 falls over the last year.  She is not having any symptoms at this time and is also not interested in neurorehabilitation now given her lack of symptoms and her current financial situation.  We will also readdress this at the follow-up visit.

## 2014-06-16 ENCOUNTER — Other Ambulatory Visit: Payer: Self-pay | Admitting: Infectious Disease

## 2014-06-16 DIAGNOSIS — B20 Human immunodeficiency virus [HIV] disease: Secondary | ICD-10-CM

## 2014-06-23 ENCOUNTER — Other Ambulatory Visit: Payer: Self-pay | Admitting: *Deleted

## 2014-06-23 DIAGNOSIS — F411 Generalized anxiety disorder: Secondary | ICD-10-CM

## 2014-06-23 MED ORDER — ALPRAZOLAM 1 MG PO TABS: 1.0000 mg | ORAL_TABLET | Freq: Three times a day (TID) | ORAL | Status: DC | PRN

## 2014-06-23 NOTE — Telephone Encounter (Signed)
Called to pharm 

## 2014-06-24 ENCOUNTER — Encounter: Payer: Self-pay | Admitting: *Deleted

## 2014-07-04 ENCOUNTER — Emergency Department (HOSPITAL_COMMUNITY): Payer: Medicare Other

## 2014-07-04 ENCOUNTER — Emergency Department (HOSPITAL_COMMUNITY)
Admission: EM | Admit: 2014-07-04 | Discharge: 2014-07-04 | Disposition: A | Discharge status: Home / Self Care | Payer: Medicare Other | Attending: Emergency Medicine | Admitting: Emergency Medicine

## 2014-07-04 ENCOUNTER — Encounter (HOSPITAL_COMMUNITY): Payer: Self-pay | Admitting: *Deleted

## 2014-07-04 DIAGNOSIS — R112 Nausea with vomiting, unspecified: Secondary | ICD-10-CM | POA: Insufficient documentation

## 2014-07-04 DIAGNOSIS — R011 Cardiac murmur, unspecified: Secondary | ICD-10-CM | POA: Diagnosis not present

## 2014-07-04 DIAGNOSIS — I10 Essential (primary) hypertension: Secondary | ICD-10-CM | POA: Diagnosis not present

## 2014-07-04 DIAGNOSIS — Z7982 Long term (current) use of aspirin: Secondary | ICD-10-CM | POA: Diagnosis not present

## 2014-07-04 DIAGNOSIS — Z79899 Other long term (current) drug therapy: Secondary | ICD-10-CM | POA: Diagnosis not present

## 2014-07-04 DIAGNOSIS — R509 Fever, unspecified: Secondary | ICD-10-CM | POA: Diagnosis not present

## 2014-07-04 DIAGNOSIS — Z8619 Personal history of other infectious and parasitic diseases: Secondary | ICD-10-CM | POA: Diagnosis not present

## 2014-07-04 DIAGNOSIS — R079 Chest pain, unspecified: Secondary | ICD-10-CM | POA: Diagnosis not present

## 2014-07-04 DIAGNOSIS — Z793 Long term (current) use of hormonal contraceptives: Secondary | ICD-10-CM | POA: Diagnosis not present

## 2014-07-04 DIAGNOSIS — J209 Acute bronchitis, unspecified: Secondary | ICD-10-CM | POA: Insufficient documentation

## 2014-07-04 DIAGNOSIS — F17210 Nicotine dependence, cigarettes, uncomplicated: Secondary | ICD-10-CM | POA: Diagnosis not present

## 2014-07-04 DIAGNOSIS — M810 Age-related osteoporosis without current pathological fracture: Secondary | ICD-10-CM | POA: Insufficient documentation

## 2014-07-04 DIAGNOSIS — Z21 Asymptomatic human immunodeficiency virus [HIV] infection status: Secondary | ICD-10-CM | POA: Diagnosis not present

## 2014-07-04 DIAGNOSIS — Z720 Tobacco use: Secondary | ICD-10-CM | POA: Insufficient documentation

## 2014-07-04 DIAGNOSIS — R630 Anorexia: Secondary | ICD-10-CM | POA: Insufficient documentation

## 2014-07-04 DIAGNOSIS — Z872 Personal history of diseases of the skin and subcutaneous tissue: Secondary | ICD-10-CM | POA: Insufficient documentation

## 2014-07-04 DIAGNOSIS — R51 Headache: Secondary | ICD-10-CM | POA: Insufficient documentation

## 2014-07-04 DIAGNOSIS — Z8669 Personal history of other diseases of the nervous system and sense organs: Secondary | ICD-10-CM | POA: Diagnosis not present

## 2014-07-04 DIAGNOSIS — K5900 Constipation, unspecified: Secondary | ICD-10-CM | POA: Insufficient documentation

## 2014-07-04 DIAGNOSIS — I739 Peripheral vascular disease, unspecified: Secondary | ICD-10-CM | POA: Insufficient documentation

## 2014-07-04 DIAGNOSIS — F419 Anxiety disorder, unspecified: Secondary | ICD-10-CM | POA: Insufficient documentation

## 2014-07-04 DIAGNOSIS — R05 Cough: Secondary | ICD-10-CM

## 2014-07-04 DIAGNOSIS — E785 Hyperlipidemia, unspecified: Secondary | ICD-10-CM | POA: Diagnosis not present

## 2014-07-04 DIAGNOSIS — R059 Cough, unspecified: Secondary | ICD-10-CM

## 2014-07-04 DIAGNOSIS — R531 Weakness: Secondary | ICD-10-CM | POA: Diagnosis not present

## 2014-07-04 LAB — BASIC METABOLIC PANEL
Anion gap: 9 (ref 5–15)
BUN: 7 mg/dL (ref 6–23)
CHLORIDE: 114 mmol/L — AB (ref 96–112)
CO2: 14 mmol/L — ABNORMAL LOW (ref 19–32)
Calcium: 8.7 mg/dL (ref 8.4–10.5)
Creatinine, Ser: 0.76 mg/dL (ref 0.50–1.10)
GFR calc non Af Amer: 83 mL/min — ABNORMAL LOW (ref >90)
Glucose, Bld: 125 mg/dL — ABNORMAL HIGH (ref 70–99)
POTASSIUM: 3.1 mmol/L — AB (ref 3.5–5.1)
SODIUM: 137 mmol/L (ref 135–145)

## 2014-07-04 LAB — CBC WITH DIFFERENTIAL/PLATELET
BASOS ABS: 0.1 10*3/uL (ref 0.0–0.1)
Basophils Relative: 1 % (ref 0–1)
Eosinophils Absolute: 0.1 10*3/uL (ref 0.0–0.7)
Eosinophils Relative: 1 % (ref 0–5)
HCT: 36.3 % (ref 36.0–46.0)
Hemoglobin: 12.5 g/dL (ref 12.0–15.0)
LYMPHS ABS: 2.7 10*3/uL (ref 0.7–4.0)
LYMPHS PCT: 43 % (ref 12–46)
MCH: 33 pg (ref 26.0–34.0)
MCHC: 34.4 g/dL (ref 30.0–36.0)
MCV: 95.8 fL (ref 78.0–100.0)
Monocytes Absolute: 0.4 10*3/uL (ref 0.1–1.0)
Monocytes Relative: 7 % (ref 3–12)
NEUTROS ABS: 3.1 10*3/uL (ref 1.7–7.7)
NEUTROS PCT: 48 % (ref 43–77)
PLATELETS: 286 10*3/uL (ref 150–400)
RBC: 3.79 MIL/uL — AB (ref 3.87–5.11)
RDW: 14.4 % (ref 11.5–15.5)
WBC: 6.3 10*3/uL (ref 4.0–10.5)

## 2014-07-04 MED ORDER — ALBUTEROL SULFATE (2.5 MG/3ML) 0.083% IN NEBU
2.5000 mg | INHALATION_SOLUTION | RESPIRATORY_TRACT | Status: DC | PRN
  Administered 2014-07-04: 2.5 mg via RESPIRATORY_TRACT
  Filled 2014-07-04: qty 3

## 2014-07-04 MED ORDER — SODIUM CHLORIDE 0.9 % IV BOLUS (SEPSIS)
1000.0000 mL | Freq: Once | INTRAVENOUS | Status: AC
  Administered 2014-07-04: 1000 mL via INTRAVENOUS

## 2014-07-04 MED ORDER — LEVOFLOXACIN 500 MG PO TABS: 500.0000 mg | ORAL_TABLET | Freq: Every day | ORAL | Status: DC

## 2014-07-04 MED ORDER — PREDNISONE 20 MG PO TABS: 20.0000 mg | ORAL_TABLET | Freq: Two times a day (BID) | ORAL | Status: DC

## 2014-07-04 MED ORDER — BENZONATATE 100 MG PO CAPS: 100.0000 mg | ORAL_CAPSULE | Freq: Three times a day (TID) | ORAL | Status: DC

## 2014-07-04 MED ORDER — LEVOFLOXACIN 500 MG PO TABS
500.0000 mg | ORAL_TABLET | Freq: Once | ORAL | Status: AC
  Administered 2014-07-04: 500 mg via ORAL
  Filled 2014-07-04: qty 1

## 2014-07-04 MED ORDER — ONDANSETRON HCL 4 MG/2ML IJ SOLN
4.0000 mg | Freq: Once | INTRAMUSCULAR | Status: AC
  Administered 2014-07-04: 4 mg via INTRAVENOUS
  Filled 2014-07-04: qty 2

## 2014-07-04 MED ORDER — ALBUTEROL SULFATE HFA 108 (90 BASE) MCG/ACT IN AERS: 1.0000 | INHALATION_SPRAY | Freq: Four times a day (QID) | RESPIRATORY_TRACT | Status: DC | PRN

## 2014-07-04 MED ORDER — KETOROLAC TROMETHAMINE 30 MG/ML IJ SOLN
30.0000 mg | Freq: Once | INTRAMUSCULAR | Status: AC
  Administered 2014-07-04: 30 mg via INTRAVENOUS
  Filled 2014-07-04: qty 1

## 2014-07-04 MED ORDER — ALBUTEROL SULFATE HFA 108 (90 BASE) MCG/ACT IN AERS
2.0000 | INHALATION_SPRAY | RESPIRATORY_TRACT | Status: DC | PRN
  Administered 2014-07-04: 2 via RESPIRATORY_TRACT
  Filled 2014-07-04: qty 6.7

## 2014-07-04 NOTE — ED Notes (Signed)
Dr James at bedside,  

## 2014-07-04 NOTE — Discharge Instructions (Signed)
Acute Bronchitis Bronchitis is inflammation of the airways that extend from the windpipe into the lungs (bronchi). The inflammation often causes mucus to develop. This leads to a cough, which is the most common symptom of bronchitis.  In acute bronchitis, the condition usually develops suddenly and goes away over time, usually in a couple weeks. Smoking, allergies, and asthma can make bronchitis worse. Repeated episodes of bronchitis may cause further lung problems.  CAUSES Acute bronchitis is most often caused by the same virus that causes a cold. The virus can spread from person to person (contagious) through coughing, sneezing, and touching contaminated objects. SIGNS AND SYMPTOMS   Cough.   Fever.   Coughing up mucus.   Body aches.   Chest congestion.   Chills.   Shortness of breath.   Sore throat.  DIAGNOSIS  Acute bronchitis is usually diagnosed through a physical exam. Your health care provider will also ask you questions about your medical history. Tests, such as chest X-rays, are sometimes done to rule out other conditions.  TREATMENT  Acute bronchitis usually goes away in a couple weeks. Oftentimes, no medical treatment is necessary. Medicines are sometimes given for relief of fever or cough. Antibiotic medicines are usually not needed but may be prescribed in certain situations. In some cases, an inhaler may be recommended to help reduce shortness of breath and control the cough. A cool mist vaporizer may also be used to help thin bronchial secretions and make it easier to clear the chest.  HOME CARE INSTRUCTIONS  Get plenty of rest.   Drink enough fluids to keep your urine clear or pale yellow (unless you have a medical condition that requires fluid restriction). Increasing fluids may help thin your respiratory secretions (sputum) and reduce chest congestion, and it will prevent dehydration.   Take medicines only as directed by your health care provider.  If  you were prescribed an antibiotic medicine, finish it all even if you start to feel better.  Avoid smoking and secondhand smoke. Exposure to cigarette smoke or irritating chemicals will make bronchitis worse. If you are a smoker, consider using nicotine gum or skin patches to help control withdrawal symptoms. Quitting smoking will help your lungs heal faster.   Reduce the chances of another bout of acute bronchitis by washing your hands frequently, avoiding people with cold symptoms, and trying not to touch your hands to your mouth, nose, or eyes.   Keep all follow-up visits as directed by your health care provider.  SEEK MEDICAL CARE IF: Your symptoms do not improve after 1 week of treatment.  SEEK IMMEDIATE MEDICAL CARE IF:  You develop an increased fever or chills.   You have chest pain.   You have severe shortness of breath.  You have bloody sputum.   You develop dehydration.  You faint or repeatedly feel like you are going to pass out.  You develop repeated vomiting.  You develop a severe headache. MAKE SURE YOU:   Understand these instructions.  Will watch your condition.  Will get help right away if you are not doing well or get worse. Document Released: 04/06/2004 Document Revised: 07/14/2013 Document Reviewed: 08/20/2012 [facility] Patient Information 2015 [place], [place]. This information is not intended to replace advice given to you by your health care provider. Make sure you discuss any questions you have with your health care provider.  Cough, Adult  A cough is a reflex. It helps you clear your throat and airways. A cough can help heal your body.  A cough can last 2 or 3 weeks (acute) or may last more than 8 weeks (chronic). Some common causes of a cough can include an infection, allergy, or a cold. HOME CARE  Only take medicine as told by your doctor.  If given, take your medicines (antibiotics) as told. Finish them even if you start to feel better.  Use  a cold steam vaporizer or humidifier in your home. This can help loosen thick spit (secretions).  Sleep so you are almost sitting up (semi-upright). Use pillows to do this. This helps reduce coughing.  Rest as needed.  Stop smoking if you smoke. GET HELP RIGHT AWAY IF:  You have yellowish-white fluid (pus) in your thick spit.  Your cough gets worse.  Your medicine does not reduce coughing, and you are losing sleep.  You cough up blood.  You have trouble breathing.  Your pain gets worse and medicine does not help.  You have a fever. MAKE SURE YOU:   Understand these instructions.  Will watch your condition.  Will get help right away if you are not doing well or get worse. Document Released: 11/10/2010 Document Revised: 07/14/2013 Document Reviewed: 11/10/2010 [facility] Patient Information 2015 [place], [place]. This information is not intended to replace advice given to you by your health care provider. Make sure you discuss any questions you have with your health care provider.

## 2014-07-04 NOTE — ED Notes (Addendum)
Family states pt has had a fever, cough, and n/v since yesterday. Pt also states she feels weak and stumbles around. Pt states it hurts to cough.

## 2014-07-04 NOTE — ED Notes (Signed)
EDP James aware of pt being a suspected sepsis pt.

## 2014-07-04 NOTE — ED Provider Notes (Signed)
CSN: [id number]     Arrival date & time 07/04/14  2046 History  This chart was scribed for Tanna Furry, MD by Jeanell Sparrow, ED Scribe. This patient was seen in room APA01/APA01 and the patient's care was started at 9:09 PM.   Chief Complaint  Patient presents with  . Fever   The history is provided by the patient. No language interpreter was used.   HPI Comments: Jessica [name] is a 74 y.o. female who presents to the Emergency Department complaining of a constant moderate fever that started about 4 days ago. She states that she has been sick lately with a Tmax of 101.4 at home with other symptoms. Pt's temperature in the ED today is 98.7. She reports having intermittent moderate nonproductive, dry cough with chest pain and abdominal pain from coughing. She states that she also has a constant moderate frontal headache with weakness, dizziness, and lightheadedness. She reports having an episode of vomiting 2 days ago, but now only has decreased appetite. She reports no modifying factors. She states that she received her flu vaccine this year.   Past Medical History  Diagnosis Date  . Human immunodeficiency virus disease 03/27/1986  . Tobacco abuse 02/19/2012  . Essential hypertension 07/20/2006  . Hyperlipidemia LDL goal < 100 04/05/2012  . 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 Lumbar degenerative disc disease 07/20/2006    With chronic back pain   . 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 Small bowel obstruction due to adhesions 02/08/2012    s/p Exploratory laparotomy, lysis of adhesions 02/12/12    . Constipation due to pain medication 04/27/2010  . Diverticulosis 02/08/2012    Extensive left-sided diverticula on colonoscopy March 2012 per Dr. Gala Romney   . Genital herpes 07/20/2006  . 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 Seborrhea 09/01/2010  . Anxiety 04/05/2012  . Bursitis of right shoulder  07/12/2012    s/p shoulder injection 07/12/2012   . Voiding dysfunction     s/p cystoscopy and meatal dilation Dec 2005  . Cataract of right eye   . Heart murmur 1961  . Blood transfusion without reported diagnosis   . Right rotator cuff tear 02/01/2013    Responds to periodic steroid injections  . Periumbilical hernia 04/19/348    1 cm left periumbilical abdominal wall defect  . 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  Hearing aides with possible masking of tinnitus recommended but patient wished to defer secondary to finances.   Past Surgical History  Procedure Laterality Date  . Eye surgery    . Aorto-femoral bypass graft  04/2007  . Appendectomy    . Cholecystectomy    . Colectomy  01/2011    Dr. Margot Chimes; "took out 12 inches of small intestiines and removed blockage"  . Abdominal hysterectomy    . Laparotomy  02/12/2012    Procedure: EXPLORATORY LAPAROTOMY;  Surgeon: Stark Klein, MD;  Location: [facility] OR;  Service: General;  Laterality: N/A;  Exploratory Laparotomy, lysis of adhesions  . Breast surgery      Breast biopsy: negative  . Small intestine surgery     Family History  Problem Relation Age of Onset  . Kidney failure Mother   . Diabetes  Mother   . Hypertension Mother   . Heart disease Mother   . Glaucoma Father   . Congestive Heart Failure Sister   . Diabetes Sister   . Kidney disease Sister   . Diabetes Brother   . Unexplained death Brother 67    Automobile accident  . Hypothyroidism Daughter   . Arthritis Daughter     Neck/Back  . Healthy Son   . HIV/AIDS Brother   . HIV Daughter   . Kidney disease Daughter   . Arthritis Son     Knee   History  Substance Use Topics  . Smoking status: Current Every Day Smoker -- 0.20 packs/day for 50 years    Types: Cigars, Cigarettes  . Smokeless tobacco: Never Used      Comment: using chantix- smokes black and mild cigars  . Alcohol Use: No     Comment: "last drink of alcohol ~ 1977"   OB History    Gravida Para Term Preterm AB TAB SAB Ectopic Multiple Living   6 4 4  2  2   4      Review of Systems  Constitutional: Positive for fever and appetite change. Negative for chills, diaphoresis and fatigue.  HENT: Negative for mouth sores, sore throat and trouble swallowing.   Eyes: Negative for visual disturbance.  Respiratory: Positive for cough. Negative for chest tightness, shortness of breath and wheezing.   Cardiovascular: Positive for chest pain.  Gastrointestinal: Positive for nausea, vomiting and abdominal pain. Negative for diarrhea and abdominal distention.  Endocrine: Negative for polydipsia, polyphagia and polyuria.  Genitourinary: Negative for dysuria, frequency and hematuria.  Musculoskeletal: Negative for gait problem.  Skin: Negative for color change, pallor and rash.  Neurological: Positive for dizziness, weakness, light-headedness and headaches. Negative for syncope.  Hematological: Does not bruise/bleed easily.  Psychiatric/Behavioral: Negative for behavioral problems and confusion.   Allergies  Hctz  Home Medications   Prior to Admission medications   Medication Sig Start Date End Date Taking? Authorizing Provider  acyclovir (ZOVIRAX) 400 MG tablet TAKE 1 TABLET BY MOUTH THREE TIMES DAILY AS NEEDED FOR OUTBREAKS FOR 7 DAYS 01/12/14  Yes Truman Hayward, MD  ALPRAZolam Duanne Moron) 1 MG tablet Take 1 tablet (1 mg total) by mouth 3 (three) times daily as needed for anxiety. 06/23/14  Yes Oval Linsey, MD  amLODipine (NORVASC) 10 MG tablet TAKE 1 TABLET BY MOUTH DAILY. 05/22/14  Yes Sid Falcon, MD  aspirin 81 MG EC tablet Take 81 mg by mouth daily.     Yes Historical Provider, MD  atenolol (TENORMIN) 25 MG tablet TAKE 1 TABLET BY MOUTH DAILY 05/22/14  Yes Sid Falcon, MD  ATRIPLA [phone] MG per tablet TAKE 1 TABLET BY MOUTH  EVERY NIGHT AT BEDTIME 06/17/14  Yes Truman Hayward, MD  benazepril (LOTENSIN) 40 MG tablet TAKE 1 TABLET BY MOUTH EVERY DAY 12/23/13  Yes Truman Hayward, MD  Calcium Carbonate Antacid (ROLAIDS EXTRA STRENGTH PO) Take 1 tablet by mouth as needed (acid reflux).    Yes Historical Provider, MD  calcium-vitamin D (OSCAL WITH D) 500-200 MG-UNIT per tablet Take 1 tablet by mouth 2 (two) times daily.   Yes Historical Provider, MD  COMBIGAN 0.2-0.5 % ophthalmic solution Place 1 drop into the left eye 2 (two) times daily. 01/06/13  Yes Historical Provider, MD  cyclobenzaprine (FLEXERIL) 10 MG tablet Take 10 mg by mouth 2 (two) times daily as needed. Takes everyday twice, can take an additional  tablet if needed for muscle spasms   Yes Historical Provider, MD  docusate sodium (COLACE) 100 MG capsule Take 100 mg by mouth 2 (two) times daily.   Yes Historical Provider, MD  ibuprofen (ADVIL,MOTRIN) 200 MG tablet Take 400 mg by mouth 3 (three) times daily as needed for pain.   Yes Historical Provider, MD  lovastatin (MEVACOR) 40 MG tablet TAKE 1 TABLET BY MOUTH AT BEDTIME 01/26/14  Yes Truman Hayward, MD  magnesium hydroxide (MILK OF MAGNESIA) 800 MG/5ML suspension Take 30 mLs by mouth daily. Patient taking differently: Take 30 mLs by mouth daily as needed for constipation or heartburn.  09/09/13  Yes Jones Bales, MD  megestrol (MEGACE) 40 MG tablet Take 2 tablets (80 mg total) by mouth 2 (two) times daily. 03/23/14  Yes Oval Linsey, MD  Multiple Vitamin (MULTIVITAMIN WITH MINERALS) TABS Take 1 tablet by mouth daily.   Yes Historical Provider, MD  sorbitol 70 % solution Take 15 mLs by mouth daily as needed. 02/20/14  Yes Oval Linsey, MD  spironolactone (ALDACTONE) 50 MG tablet Take 1 tablet (50 mg total) by mouth daily. 04/24/14 04/24/15 Yes Oval Linsey, MD  triamcinolone ointment (KENALOG) 0.1 % APPLY TWICE DAILY 05/22/14  Yes Truman Hayward, MD  albuterol (PROVENTIL HFA;VENTOLIN HFA)  108 (90 BASE) MCG/ACT inhaler Inhale 1-2 puffs into the lungs every 6 (six) hours as needed for wheezing. 07/04/14   Tanna Furry, MD  alendronate (FOSAMAX) 70 MG tablet Take 1 tablet (70 mg total) by mouth once a week. Patient taking differently: Take 70 mg by mouth once a week. Takes on Sunday 05/26/14   Oval Linsey, MD  benzonatate (TESSALON) 100 MG capsule Take 1 capsule (100 mg total) by mouth every 8 (eight) hours. 07/04/14   Tanna Furry, MD  fentaNYL (DURAGESIC) 50 MCG/HR Place 1 patch (50 mcg total) onto the skin every 3 (three) days. 05/18/14 08/03/14  Oval Linsey, MD  HYDROcodone-acetaminophen (NORCO) 10-325 MG per tablet Take 1 tablet by mouth every 8 (eight) hours as needed for moderate pain. Patient not taking: Reported on 07/04/2014 05/18/14   Oval Linsey, MD  levofloxacin (LEVAQUIN) 500 MG tablet Take 1 tablet (500 mg total) by mouth daily. 07/04/14   Tanna Furry, MD  predniSONE (DELTASONE) 20 MG tablet Take 1 tablet (20 mg total) by mouth 2 (two) times daily with a meal. 07/04/14   Tanna Furry, MD   BP 120/60 mmHg  Pulse 99  Temp(Src) 98.7 F (37.1 C) (Oral)  Resp 24  Ht 5\' 8"  (1.727 m)  Wt 137 lb (62.143 kg)  BMI 20.84 kg/m2  SpO2 98% Physical Exam  Constitutional: She is oriented to person, place, and time. She appears well-developed and well-nourished. No distress.  HENT:  Head: Normocephalic.  Eyes: Conjunctivae are normal. Pupils are equal, round, and reactive to light. No scleral icterus.  Neck: Normal range of motion. Neck supple. No thyromegaly present.  Cardiovascular: Normal rate and regular rhythm.  Exam reveals no gallop and no friction rub.   No murmur heard. Pulmonary/Chest: Effort normal. No respiratory distress.  Rhonchi present with cough.   Abdominal: Soft. Bowel sounds are normal. She exhibits no distension. There is no tenderness. There is no rebound.  Musculoskeletal: Normal range of motion.  Neurological: She is alert and oriented to person, place, and  time.  Skin: Skin is warm and dry. No rash noted.  Psychiatric: She has a normal mood and affect. Her behavior is normal.  Nursing note  and vitals reviewed.   ED Course  Procedures (including critical care time) DIAGNOSTIC STUDIES: Oxygen Saturation is 98% on RA, normal by my interpretation.    COORDINATION OF CARE: 9:13 PM- Pt advised of plan for treatment which includes medication, radiology, and labs and pt agrees.  Labs Review Labs Reviewed  CBC WITH DIFFERENTIAL/PLATELET - Abnormal; Notable for the following:    RBC 3.79 (*)    All other components within normal limits  BASIC METABOLIC PANEL - Abnormal; Notable for the following:    Potassium 3.1 (*)    Chloride 114 (*)    CO2 14 (*)    Glucose, Bld 125 (*)    GFR calc non Af Amer 83 (*)    All other components within normal limits    Imaging Review No results found.   EKG Interpretation None      MDM   Final diagnoses:  Acute bronchitis, unspecified organism   I personally performed the services described in this documentation, which was scribed in my presence. The recorded information has been reviewed and is accurate.     Tanna Furry, MD 07/04/14 [phone]

## 2014-07-04 NOTE — ED Notes (Signed)
Pt left ED via wheelchair with family. There were no signs of distress. Pt and family verbalizes discharge instructions.

## 2014-07-16 ENCOUNTER — Other Ambulatory Visit: Payer: Self-pay | Admitting: Infectious Disease

## 2014-07-21 ENCOUNTER — Other Ambulatory Visit: Payer: Self-pay | Admitting: Infectious Disease

## 2014-07-21 ENCOUNTER — Other Ambulatory Visit: Payer: Self-pay | Admitting: Internal Medicine

## 2014-07-21 DIAGNOSIS — M5136 Other intervertebral disc degeneration, lumbar region: Secondary | ICD-10-CM

## 2014-07-21 DIAGNOSIS — E785 Hyperlipidemia, unspecified: Secondary | ICD-10-CM

## 2014-08-19 ENCOUNTER — Other Ambulatory Visit (HOSPITAL_COMMUNITY)
Admission: RE | Admit: 2014-08-19 | Discharge: 2014-08-19 | Disposition: A | Discharge status: Home / Self Care | Payer: Medicare Other | Source: Ambulatory Visit | Attending: Infectious Disease | Admitting: Infectious Disease

## 2014-08-19 ENCOUNTER — Other Ambulatory Visit: Payer: Medicare Other

## 2014-08-19 DIAGNOSIS — E785 Hyperlipidemia, unspecified: Secondary | ICD-10-CM | POA: Diagnosis not present

## 2014-08-19 DIAGNOSIS — Z113 Encounter for screening for infections with a predominantly sexual mode of transmission: Secondary | ICD-10-CM | POA: Diagnosis present

## 2014-08-19 DIAGNOSIS — I10 Essential (primary) hypertension: Secondary | ICD-10-CM | POA: Diagnosis not present

## 2014-08-19 DIAGNOSIS — B20 Human immunodeficiency virus [HIV] disease: Secondary | ICD-10-CM | POA: Diagnosis not present

## 2014-08-19 LAB — CBC WITH DIFFERENTIAL/PLATELET
Basophils Absolute: 0 10*3/uL (ref 0.0–0.1)
Basophils Relative: 0 % (ref 0–1)
EOS PCT: 2 % (ref 0–5)
Eosinophils Absolute: 0.2 10*3/uL (ref 0.0–0.7)
HEMATOCRIT: 37.8 % (ref 36.0–46.0)
HEMOGLOBIN: 12.9 g/dL (ref 12.0–15.0)
Lymphocytes Relative: 52 % — ABNORMAL HIGH (ref 12–46)
Lymphs Abs: 4.7 10*3/uL — ABNORMAL HIGH (ref 0.7–4.0)
MCH: 32.9 pg (ref 26.0–34.0)
MCHC: 34.1 g/dL (ref 30.0–36.0)
MCV: 96.4 fL (ref 78.0–100.0)
MPV: 9.2 fL (ref 8.6–12.4)
Monocytes Absolute: 0.3 10*3/uL (ref 0.1–1.0)
Monocytes Relative: 3 % (ref 3–12)
NEUTROS PCT: 43 % (ref 43–77)
Neutro Abs: 3.9 10*3/uL (ref 1.7–7.7)
PLATELETS: 368 10*3/uL (ref 150–400)
RBC: 3.92 MIL/uL (ref 3.87–5.11)
RDW: 14.4 % (ref 11.5–15.5)
WBC: 9 10*3/uL (ref 4.0–10.5)

## 2014-08-19 LAB — COMPLETE METABOLIC PANEL WITH GFR
ALK PHOS: 53 U/L (ref 39–117)
ALT: 9 U/L (ref 0–35)
AST: 18 U/L (ref 0–37)
Albumin: 4.1 g/dL (ref 3.5–5.2)
BILIRUBIN TOTAL: 0.2 mg/dL (ref 0.2–1.2)
BUN: 11 mg/dL (ref 6–23)
CO2: 17 mEq/L — ABNORMAL LOW (ref 19–32)
CREATININE: 0.69 mg/dL (ref 0.50–1.10)
Calcium: 9.2 mg/dL (ref 8.4–10.5)
Chloride: 116 mEq/L — ABNORMAL HIGH (ref 96–112)
GFR, EST NON AFRICAN AMERICAN: 88 mL/min
GLUCOSE: 96 mg/dL (ref 70–99)
POTASSIUM: 3.7 meq/L (ref 3.5–5.3)
Sodium: 143 mEq/L (ref 135–145)
TOTAL PROTEIN: 7.2 g/dL (ref 6.0–8.3)

## 2014-08-19 LAB — LIPID PANEL
Cholesterol: 118 mg/dL (ref 0–200)
HDL: 52 mg/dL (ref >=46)
LDL Cholesterol: 52 mg/dL (ref 0–99)
Total CHOL/HDL Ratio: 2.3 ratio
Triglycerides: 68 mg/dL (ref <150)
VLDL: 14 mg/dL (ref 0–40)

## 2014-08-19 LAB — SYPHILIS: RPR W/REFLEX TO RPR TITER AND TREPONEMAL ANTIBODIES, TRADITIONAL SCREENING AND DIAGNOSIS ALGORITHM

## 2014-08-19 LAB — MICROALBUMIN / CREATININE URINE RATIO
Creatinine, Urine: 79.8 mg/dL
MICROALB UR: 1.5 mg/dL (ref <2.0)
Microalb Creat Ratio: 18.8 mg/g (ref 0.0–30.0)

## 2014-08-20 LAB — HIV-1 RNA ULTRAQUANT REFLEX TO GENTYP+

## 2014-08-20 LAB — URINE CYTOLOGY ANCILLARY ONLY
Chlamydia: NEGATIVE
Neisseria Gonorrhea: NEGATIVE

## 2014-08-20 LAB — T-HELPER CELL (CD4) - (RCID CLINIC ONLY)
CD4 % Helper T Cell: 41 % (ref 33–55)
CD4 T CELL ABS: 1830 /uL (ref 400–2700)

## 2014-08-28 ENCOUNTER — Ambulatory Visit: Payer: Medicare Other | Admitting: Internal Medicine

## 2014-09-02 ENCOUNTER — Ambulatory Visit (INDEPENDENT_AMBULATORY_CARE_PROVIDER_SITE_OTHER): Payer: Medicare Other | Admitting: Infectious Disease

## 2014-09-02 ENCOUNTER — Encounter: Payer: Self-pay | Admitting: Infectious Disease

## 2014-09-02 VITALS — BP 190/100 | HR 101 | Temp 98.5°F | Wt 138.0 lb

## 2014-09-02 DIAGNOSIS — I10 Essential (primary) hypertension: Secondary | ICD-10-CM

## 2014-09-02 DIAGNOSIS — B20 Human immunodeficiency virus [HIV] disease: Secondary | ICD-10-CM | POA: Diagnosis not present

## 2014-09-02 DIAGNOSIS — E785 Hyperlipidemia, unspecified: Secondary | ICD-10-CM

## 2014-09-02 NOTE — Progress Notes (Signed)
  Subjective:    Patient ID: Jessica [name], female    DOB: 1940-07-16, 74 y.o.   MRN: [id number]  HPI  Jessica [name] is a 74 y.o. female with HIV infection who is doing superbly well on her antiviral regimen, Atripla with undetectable viral load and health cd4 count.   Lab Results  Component Value Date   HIV1RNAQUANT <20 08/19/2014   Lab Results  Component Value Date   CD4TABS 1830 08/19/2014   CD4TABS 2350 02/16/2014   CD4TABS 2390 08/25/2013     She does have PVD and is seen by Cardiology, [facility]VS having undergon bypass surgery and seen by Dr Eppie Gibson in [facility] clinic  She had trouble with Chantix having vivid dreams of her brother who is deceased and now strengthening gone to nicotine gum.  We discussed changing over to a TAF-truvada based regimen such as GENVOYA vs ODEFSEY vs TIVICAY and DESCOVY  She really disliked the food requirement for ODEFSEY and GENVOYA.       Review of Systems  Constitutional: Negative for chills, diaphoresis, activity change, appetite change, fatigue and unexpected weight change.  HENT: Negative for congestion, ear pain, rhinorrhea, sinus pressure, sneezing, sore throat and trouble swallowing.   Eyes: Negative for photophobia and visual disturbance.  Respiratory: Negative for cough, chest tightness, shortness of breath, wheezing and stridor.   Cardiovascular: Negative for palpitations and leg swelling.  Gastrointestinal: Negative for nausea, vomiting, diarrhea, constipation, blood in stool, abdominal distention and anal bleeding.  Genitourinary: Negative for hematuria, flank pain and difficulty urinating.  Musculoskeletal: Negative for myalgias, joint swelling, arthralgias and gait problem.  Skin: Negative for color change, pallor and wound.  Neurological: Negative for dizziness, tremors and light-headedness.  Hematological: Negative for adenopathy. Does not bruise/bleed easily.  Psychiatric/Behavioral: Negative for behavioral  problems, confusion, sleep disturbance, dysphoric mood, decreased concentration and agitation.       Objective:   Physical Exam  Constitutional: She is oriented to person, place, and time. She appears well-developed and well-nourished. No distress.  HENT:  Head: Normocephalic and atraumatic.  Mouth/Throat: No oropharyngeal exudate.  Eyes: Conjunctivae and EOM are normal. No scleral icterus.  Neck: Normal range of motion. Neck supple.  Cardiovascular: Normal rate and regular rhythm.   Pulmonary/Chest: Effort normal. No respiratory distress. She has no wheezes.  Abdominal: She exhibits no distension.  Musculoskeletal: She exhibits no edema or tenderness.  Neurological: She is alert and oriented to person, place, and time. She exhibits normal muscle tone. Coordination normal.  Skin: Skin is warm and dry. No rash noted. She is not diaphoretic. No erythema. No pallor.  Psychiatric: She has a normal mood and affect. Her behavior is normal. Judgment and thought content normal.          Assessment & Plan:   HIV: Perfect control continue current antiretroviral regimen. Tivicay and Descovy when latter is on the [employer] formulary .   HTN:bp VERY high, recheck and is to see Dr. Eppie Gibson on Friday. Says she IS taking her 4 bp meds

## 2014-09-03 ENCOUNTER — Emergency Department (HOSPITAL_COMMUNITY)
Admission: EM | Admit: 2014-09-03 | Discharge: 2014-09-04 | Disposition: A | Discharge status: Home / Self Care | Payer: Medicare Other | Attending: Emergency Medicine | Admitting: Emergency Medicine

## 2014-09-03 ENCOUNTER — Emergency Department (HOSPITAL_COMMUNITY): Payer: Medicare Other

## 2014-09-03 ENCOUNTER — Encounter (HOSPITAL_COMMUNITY): Payer: Self-pay | Admitting: *Deleted

## 2014-09-03 DIAGNOSIS — R109 Unspecified abdominal pain: Secondary | ICD-10-CM | POA: Diagnosis not present

## 2014-09-03 DIAGNOSIS — E785 Hyperlipidemia, unspecified: Secondary | ICD-10-CM | POA: Insufficient documentation

## 2014-09-03 DIAGNOSIS — F419 Anxiety disorder, unspecified: Secondary | ICD-10-CM | POA: Diagnosis not present

## 2014-09-03 DIAGNOSIS — Z720 Tobacco use: Secondary | ICD-10-CM | POA: Insufficient documentation

## 2014-09-03 DIAGNOSIS — N390 Urinary tract infection, site not specified: Secondary | ICD-10-CM | POA: Diagnosis not present

## 2014-09-03 DIAGNOSIS — Z21 Asymptomatic human immunodeficiency virus [HIV] infection status: Secondary | ICD-10-CM | POA: Diagnosis not present

## 2014-09-03 DIAGNOSIS — Z7952 Long term (current) use of systemic steroids: Secondary | ICD-10-CM | POA: Diagnosis not present

## 2014-09-03 DIAGNOSIS — Z79899 Other long term (current) drug therapy: Secondary | ICD-10-CM | POA: Diagnosis not present

## 2014-09-03 DIAGNOSIS — R51 Headache: Secondary | ICD-10-CM | POA: Insufficient documentation

## 2014-09-03 DIAGNOSIS — Z8739 Personal history of other diseases of the musculoskeletal system and connective tissue: Secondary | ICD-10-CM | POA: Diagnosis not present

## 2014-09-03 DIAGNOSIS — I10 Essential (primary) hypertension: Secondary | ICD-10-CM | POA: Insufficient documentation

## 2014-09-03 DIAGNOSIS — Z872 Personal history of diseases of the skin and subcutaneous tissue: Secondary | ICD-10-CM | POA: Insufficient documentation

## 2014-09-03 DIAGNOSIS — Z8719 Personal history of other diseases of the digestive system: Secondary | ICD-10-CM | POA: Diagnosis not present

## 2014-09-03 DIAGNOSIS — Z7982 Long term (current) use of aspirin: Secondary | ICD-10-CM | POA: Diagnosis not present

## 2014-09-03 DIAGNOSIS — H903 Sensorineural hearing loss, bilateral: Secondary | ICD-10-CM | POA: Diagnosis not present

## 2014-09-03 DIAGNOSIS — Z8619 Personal history of other infectious and parasitic diseases: Secondary | ICD-10-CM | POA: Insufficient documentation

## 2014-09-03 DIAGNOSIS — R1084 Generalized abdominal pain: Secondary | ICD-10-CM | POA: Diagnosis not present

## 2014-09-03 DIAGNOSIS — R011 Cardiac murmur, unspecified: Secondary | ICD-10-CM | POA: Diagnosis not present

## 2014-09-03 DIAGNOSIS — R55 Syncope and collapse: Secondary | ICD-10-CM | POA: Insufficient documentation

## 2014-09-03 LAB — BASIC METABOLIC PANEL
Anion gap: 10 (ref 5–15)
BUN: 14 mg/dL (ref 6–20)
CO2: 13 mmol/L — ABNORMAL LOW (ref 22–32)
Calcium: 9.2 mg/dL (ref 8.9–10.3)
Chloride: 120 mmol/L — ABNORMAL HIGH (ref 101–111)
Creatinine, Ser: 1.25 mg/dL — ABNORMAL HIGH (ref 0.44–1.00)
GFR calc Af Amer: 49 mL/min — ABNORMAL LOW (ref >60)
GFR, EST NON AFRICAN AMERICAN: 42 mL/min — AB (ref >60)
GLUCOSE: 118 mg/dL — AB (ref 65–99)
POTASSIUM: 2.9 mmol/L — AB (ref 3.5–5.1)
SODIUM: 143 mmol/L (ref 135–145)

## 2014-09-03 LAB — CBC
HCT: 38.2 % (ref 36.0–46.0)
Hemoglobin: 13 g/dL (ref 12.0–15.0)
MCH: 33.3 pg (ref 26.0–34.0)
MCHC: 34 g/dL (ref 30.0–36.0)
MCV: 97.9 fL (ref 78.0–100.0)
PLATELETS: 280 10*3/uL (ref 150–400)
RBC: 3.9 MIL/uL (ref 3.87–5.11)
RDW: 15.2 % (ref 11.5–15.5)
WBC: 14.1 10*3/uL — ABNORMAL HIGH (ref 4.0–10.5)

## 2014-09-03 LAB — URINALYSIS, ROUTINE W REFLEX MICROSCOPIC
Bilirubin Urine: NEGATIVE
Glucose, UA: NEGATIVE mg/dL
Hgb urine dipstick: NEGATIVE
Ketones, ur: NEGATIVE mg/dL
NITRITE: NEGATIVE
PH: 6 (ref 5.0–8.0)
SPECIFIC GRAVITY, URINE: 1.02 (ref 1.005–1.030)
Urobilinogen, UA: 0.2 mg/dL (ref 0.0–1.0)

## 2014-09-03 LAB — URINE MICROSCOPIC-ADD ON

## 2014-09-03 LAB — LIPASE, BLOOD: Lipase: 26 U/L (ref 22–51)

## 2014-09-03 MED ORDER — SODIUM CHLORIDE 0.9 % IV SOLN: 1000.0000 mL | INTRAVENOUS | Status: DC

## 2014-09-03 MED ORDER — FENTANYL CITRATE (PF) 100 MCG/2ML IJ SOLN
INTRAMUSCULAR | Status: DC
Start: 2014-09-03 — End: 2014-09-04
  Filled 2014-09-03: qty 2

## 2014-09-03 MED ORDER — DEXTROSE 5 % IV SOLN
1.0000 g | Freq: Once | INTRAVENOUS | Status: AC
  Administered 2014-09-03: 1 g via INTRAVENOUS
  Filled 2014-09-03: qty 10

## 2014-09-03 MED ORDER — POTASSIUM CHLORIDE CRYS ER 20 MEQ PO TBCR
40.0000 meq | EXTENDED_RELEASE_TABLET | Freq: Once | ORAL | Status: AC
  Administered 2014-09-03: 40 meq via ORAL
  Filled 2014-09-03: qty 2

## 2014-09-03 MED ORDER — ONDANSETRON HCL 4 MG/2ML IJ SOLN
4.0000 mg | Freq: Once | INTRAMUSCULAR | Status: AC
  Administered 2014-09-03: 4 mg via INTRAVENOUS

## 2014-09-03 MED ORDER — ONDANSETRON HCL 4 MG/2ML IJ SOLN: 4.0000 mg | Freq: Once | INTRAMUSCULAR | Status: DC

## 2014-09-03 MED ORDER — SODIUM CHLORIDE 0.9 % IV SOLN
1000.0000 mL | Freq: Once | INTRAVENOUS | Status: AC
  Administered 2014-09-03: 1000 mL via INTRAVENOUS

## 2014-09-03 MED ORDER — FENTANYL CITRATE (PF) 100 MCG/2ML IJ SOLN
50.0000 ug | Freq: Once | INTRAMUSCULAR | Status: AC
Start: 2014-09-03 — End: 2014-09-03
  Administered 2014-09-03: 50 ug via INTRAVENOUS

## 2014-09-03 MED ORDER — ONDANSETRON HCL 4 MG/2ML IJ SOLN
INTRAMUSCULAR | Status: DC
Start: 2014-09-03 — End: 2014-09-04
  Filled 2014-09-03: qty 2

## 2014-09-03 NOTE — ED Notes (Signed)
Assisted patient to restroom, patient unable to void and states that she feels extremely constipated. Patient straining to have a bowel movement. Explained to patient that if she strained she could cause herself to pass out.

## 2014-09-03 NOTE — ED Notes (Signed)
Pt got sick vomiting.

## 2014-09-03 NOTE — ED Notes (Signed)
Pt returned to room w/ no complications

## 2014-09-03 NOTE — ED Provider Notes (Signed)
CSN: [id number]     Arrival date & time 09/03/14  2031 History  This chart was scribed for Nat Christen, MD by Rayfield Citizen, ED Scribe. This patient was seen in room APA19/AP[facility]19 and the patient's care was started at 9:37 PM.     Chief Complaint  Patient presents with  . Hypotension   The history is provided by the patient and a relative. No language interpreter was used.     HPI Comments: Yukiko Minnich is a 74 y.o. female with past medical history of HIV, HTN, HLD, brought in by ambulance who presents to the Emergency Department after a syncopal episode. Patient explains she was walking to the bathroom when she felt her body "not responding the way she wanted it to," when she passed out and fell to the floor. She notes prior experience with similar syncopal episodes, which she attributes to vaso vagal response. Patient states she does not yet feel back to baseline; she notes abdominal pain and headache at this time.   PCP Dr. Eppie Gibson  Past Medical History  Diagnosis Date  . Human immunodeficiency virus disease 03/27/1986  . Tobacco abuse 02/19/2012  . Essential hypertension 07/20/2006  . Hyperlipidemia LDL goal < 100 04/05/2012  . 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 Lumbar degenerative disc disease 07/20/2006    With chronic back pain   . 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 Small bowel obstruction due to adhesions 02/08/2012    s/p Exploratory laparotomy, lysis of adhesions 02/12/12    . Constipation due to pain medication 04/27/2010  . Diverticulosis 02/08/2012    Extensive left-sided diverticula on colonoscopy March 2012 per Dr. Gala Romney   . Genital herpes 07/20/2006  . 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 Seborrhea 09/01/2010  . Anxiety 04/05/2012  . Bursitis of right shoulder 07/12/2012    s/p shoulder injection 07/12/2012   . Voiding dysfunction     s/p cystoscopy and meatal dilation  Dec 2005  . Cataract of right eye   . Heart murmur 1961  . Blood transfusion without reported diagnosis   . Right rotator cuff tear 02/01/2013    Responds to periodic steroid injections  . Periumbilical hernia 05/19/2503    1 cm left periumbilical abdominal wall defect  . 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  Hearing aides with possible masking of tinnitus recommended but patient wished to defer secondary to finances.   Past Surgical History  Procedure Laterality Date  . Eye surgery    . Aorto-femoral bypass graft  04/2007  . Appendectomy    . Cholecystectomy    . Colectomy  01/2011    Dr. Margot Chimes; "took out 12 inches of small intestiines and removed blockage"  . Abdominal hysterectomy    . Laparotomy  02/12/2012    Procedure: EXPLORATORY LAPAROTOMY;  Surgeon: Stark Klein, MD;  Location: [facility] OR;  Service: General;  Laterality: N/A;  Exploratory Laparotomy, lysis of adhesions  . Breast surgery      Breast biopsy: negative  . Small intestine surgery     Family History  Problem Relation Age of Onset  . Kidney failure Mother   . Diabetes Mother   . Hypertension Mother   . Heart  disease Mother   . Glaucoma Father   . Congestive Heart Failure Sister   . Diabetes Sister   . Kidney disease Sister   . Diabetes Brother   . Unexplained death Brother 57    Automobile accident  . Hypothyroidism Daughter   . Arthritis Daughter     Neck/Back  . Healthy Son   . HIV/AIDS Brother   . HIV Daughter   . Kidney disease Daughter   . Arthritis Son     Knee   History  Substance Use Topics  . Smoking status: Current Every Day Smoker -- 0.20 packs/day for 50 years    Types: Cigars, Cigarettes  . Smokeless tobacco: Never Used     Comment: using chantix- smokes black and mild cigars  . Alcohol Use: No     Comment: "last drink of alcohol ~ 1977"    OB History    Gravida Para Term Preterm AB TAB SAB Ectopic Multiple Living   6 4 4  2  2   4      Review of Systems  A complete 10 system review of systems was obtained and all systems are negative except as noted in the HPI and PMH.    Allergies  Hctz  Home Medications   Prior to Admission medications   Medication Sig Start Date End Date Taking? Authorizing Provider  ALPRAZolam Duanne Moron) 1 MG tablet Take 1 tablet (1 mg total) by mouth 3 (three) times daily as needed for anxiety. 06/23/14  Yes Oval Linsey, MD  amLODipine (NORVASC) 10 MG tablet TAKE 1 TABLET BY MOUTH DAILY. 05/22/14  Yes Sid Falcon, MD  aspirin 81 MG EC tablet Take 81 mg by mouth daily.     Yes Historical Provider, MD  atenolol (TENORMIN) 25 MG tablet TAKE 1 TABLET BY MOUTH DAILY 05/22/14  Yes Sid Falcon, MD  ATRIPLA [phone] MG per tablet TAKE 1 TABLET BY MOUTH EVERY NIGHT AT BEDTIME 06/17/14  Yes Truman Hayward, MD  benazepril (LOTENSIN) 40 MG tablet TAKE 1 TABLET BY MOUTH EVERY DAY 12/23/13  Yes Truman Hayward, MD  calcium-vitamin D (OSCAL WITH D) 500-200 MG-UNIT per tablet Take 1 tablet by mouth 2 (two) times daily.   Yes Historical Provider, MD  COMBIGAN 0.2-0.5 % ophthalmic solution Place 1 drop into the left eye 2 (two) times daily. 01/06/13  Yes Historical Provider, MD  docusate sodium (COLACE) 100 MG capsule Take 100 mg by mouth 2 (two) times daily.   Yes Historical Provider, MD  fentaNYL (DURAGESIC - DOSED MCG/HR) 50 MCG/HR Place 50 mcg onto the skin every 3 (three) days. 08/18/14  Yes Historical Provider, MD  lovastatin (MEVACOR) 40 MG tablet TAKE 1 TABLET BY MOUTH AT BEDTIME 07/21/14  Yes Truman Hayward, MD  megestrol (MEGACE) 40 MG tablet Take 2 tablets (80 mg total) by mouth 2 (two) times daily. 03/23/14  Yes Oval Linsey, MD  Multiple Vitamin (MULTIVITAMIN WITH MINERALS) TABS Take 1 tablet by mouth daily.   Yes Historical Provider, MD  spironolactone (ALDACTONE) 50 MG tablet Take 1  tablet (50 mg total) by mouth daily. 04/24/14 04/24/15 Yes Oval Linsey, MD  triamcinolone ointment (KENALOG) 0.1 % APPLY TWICE DAILY 05/22/14  Yes Truman Hayward, MD  acyclovir (ZOVIRAX) 400 MG tablet TAKE 1 TABLET BY MOUTH THREE TIMES DAILY AS NEEDED FOR OUTBREAKS FOR 7 DAYS 07/16/14   Truman Hayward, MD  albuterol (PROVENTIL HFA;VENTOLIN HFA) 108 (90 BASE) MCG/ACT inhaler Inhale  1-2 puffs into the lungs every 6 (six) hours as needed for wheezing. 07/04/14   Tanna Furry, MD  alendronate (FOSAMAX) 70 MG tablet Take 1 tablet (70 mg total) by mouth once a week. Patient taking differently: Take 70 mg by mouth once a week. Takes on Sunday 05/26/14   Oval Linsey, MD  Calcium Carbonate Antacid (ROLAIDS EXTRA STRENGTH PO) Take 1 tablet by mouth as needed (acid reflux).     Historical Provider, MD  cephALEXin (KEFLEX) 500 MG capsule Take 1 capsule (500 mg total) by mouth 4 (four) times daily. 09/04/14   Nat Christen, MD  cyclobenzaprine (FLEXERIL) 10 MG tablet Take 1 tablet (10 mg total) by mouth 3 (three) times daily as needed for muscle spasms. 07/21/14   Oval Linsey, MD  HYDROcodone-acetaminophen (NORCO) 10-325 MG per tablet Take 1 tablet by mouth every 8 (eight) hours as needed for moderate pain. 05/18/14   Oval Linsey, MD  ibuprofen (ADVIL,MOTRIN) 200 MG tablet Take 400 mg by mouth 3 (three) times daily as needed for pain.    Historical Provider, MD  magnesium hydroxide (MILK OF MAGNESIA) 800 MG/5ML suspension Take 30 mLs by mouth daily. Patient taking differently: Take 30 mLs by mouth daily as needed for constipation or heartburn.  09/09/13   Jones Bales, MD  sorbitol 70 % solution Take 15 mLs by mouth daily as needed. Patient not taking: Reported on 09/03/2014 02/20/14   Oval Linsey, MD   BP 177/89 mmHg  Pulse 87  Temp(Src) 98.2 F (36.8 C) (Oral)  Resp 18  Ht 5\' 8"  (1.727 m)  Wt 140 lb (63.504 kg)  BMI 21.29 kg/m2  SpO2 99% Physical Exam  Constitutional: She is oriented to  person, place, and time. She appears well-developed and well-nourished.  HENT:  Head: Normocephalic and atraumatic.  Eyes: Conjunctivae and EOM are normal. Pupils are equal, round, and reactive to light.  Neck: Normal range of motion. Neck supple.  Cardiovascular: Normal rate and regular rhythm.   Pulmonary/Chest: Effort normal and breath sounds normal.  Abdominal: Soft. Bowel sounds are normal. Distention: Minimal generalized abdominal tenderness. There is tenderness.  Musculoskeletal: Normal range of motion.  Neurological: She is alert and oriented to person, place, and time.  Skin: Skin is warm and dry.  Psychiatric: She has a normal mood and affect. Her behavior is normal.  Nursing note and vitals reviewed.   ED Course  Procedures   DIAGNOSTIC STUDIES: Oxygen Saturation is 99% on RA, normal by my interpretation.    COORDINATION OF CARE: 9:42 PM Discussed treatment plan with pt at bedside, including IV fluids and an acute abdominal series, and pt agreed to plan.   Labs Review Labs Reviewed  CBC - Abnormal; Notable for the following:    WBC 14.1 (*)    All other components within normal limits  BASIC METABOLIC PANEL - Abnormal; Notable for the following:    Potassium 2.9 (*)    Chloride 120 (*)    CO2 13 (*)    Glucose, Bld 118 (*)    Creatinine, Ser 1.25 (*)    GFR calc non Af Amer 42 (*)    GFR calc Af Amer 49 (*)    All other components within normal limits  URINALYSIS, ROUTINE W REFLEX MICROSCOPIC (NOT AT [facility]) - Abnormal; Notable for the following:    APPearance HAZY (*)    Protein, ur TRACE (*)    Leukocytes, UA SMALL (*)    All other components within normal limits  URINE MICROSCOPIC-ADD ON - Abnormal; Notable for the following:    Squamous Epithelial / LPF FEW (*)    Bacteria, UA MANY (*)    All other components within normal limits  URINE CULTURE  LIPASE, BLOOD    Imaging Review Dg Abd Acute W/chest  09/03/2014   CLINICAL DATA:  Patient with syncopal  episode. Passed out while walking to restaurant. Left flank pain.  EXAM: DG ABDOMEN ACUTE W/ 1V CHEST  COMPARISON:  Chest radiograph 07/04/2014  FINDINGS: Monitoring leads overlie the patient. Stable cardiac and mediastinal contours. Apical pleural parenchymal thickening. No consolidative pulmonary opacities. No pleural effusion or pneumothorax.  Relative paucity of small bowel gas. Stool throughout the colon. Cholecystectomy clips. No free intraperitoneal air. Lumbar spine degenerative changes.  IMPRESSION: No acute cardiopulmonary process.  Paucity of small bowel gas limits evaluation however no definite evidence for overt obstruction.  Stool throughout the colon as can be seen with constipation.   Electronically Signed   By: Lovey Newcomer M.D.   On: 09/03/2014 22:57     EKG Interpretation   Date/Time:  Thursday September 03 2014 20:47:43 EDT Ventricular Rate:  104 PR Interval:  152 QRS Duration: 93 QT Interval:  412 QTC Calculation: 542 R Axis:   14 Text Interpretation:  Sinus tachycardia Ventricular premature complex  Abnormal R-wave progression, early transition Borderline repolarization  abnormality Borderline prolonged QT interval Confirmed by Lacinda Axon  MD, Chaze Hruska  [phone]) on 09/03/2014 9:33:34 PM      MDM   Final diagnoses:  Syncope, unspecified syncope type  UTI (lower urinary tract infection)   Patient feels much better after 2 L of IV fluid. Blood pressure has normalized. Workup reveals a urinary tract infection. IV Rocephin. Discharge medications Keflex. Urine culture.   I personally performed the services described in this documentation, which was scribed in my presence. The recorded information has been reviewed and is accurate.      Nat Christen, MD 09/04/14 [phone]

## 2014-09-03 NOTE — ED Notes (Signed)
Pt arrived from home by EMS. Report went out for unresponsive. Pt found on toilet w/ low BP & diaphoretic. Per EMS pt states she has been constipated for the past 3 to 4 days. EMS started IV & started IV fluid. Pt responding at present.

## 2014-09-03 NOTE — ED Notes (Signed)
Pt ambulated to restroom w/ NT present.

## 2014-09-04 ENCOUNTER — Ambulatory Visit: Payer: Medicare Other | Admitting: Internal Medicine

## 2014-09-04 ENCOUNTER — Encounter: Payer: Self-pay | Admitting: Internal Medicine

## 2014-09-04 MED ORDER — CEPHALEXIN 500 MG PO CAPS: 500.0000 mg | ORAL_CAPSULE | Freq: Four times a day (QID) | ORAL | Status: DC

## 2014-09-04 NOTE — ED Notes (Signed)
Pt alert & oriented x4, stable gait. Patient given discharge instructions, paperwork & prescription(s). Patient verbalized understanding. Pt left department by wheelchair w/ no further questions. 

## 2014-09-04 NOTE — Discharge Instructions (Signed)
Increase fluids. Antibiotic 4 times a day. Follow-up your primary care doctor. Do not rise quickly from a sitting position

## 2014-09-07 LAB — URINE CULTURE: Culture: >=100000

## 2014-09-09 ENCOUNTER — Ambulatory Visit (INDEPENDENT_AMBULATORY_CARE_PROVIDER_SITE_OTHER): Payer: Medicare Other | Admitting: Internal Medicine

## 2014-09-09 ENCOUNTER — Encounter: Payer: Self-pay | Admitting: Internal Medicine

## 2014-09-09 VITALS — BP 187/86 | HR 71 | Temp 98.5°F | Wt 138.7 lb

## 2014-09-09 DIAGNOSIS — T402X5D Adverse effect of other opioids, subsequent encounter: Secondary | ICD-10-CM | POA: Diagnosis not present

## 2014-09-09 DIAGNOSIS — M5136 Other intervertebral disc degeneration, lumbar region: Secondary | ICD-10-CM

## 2014-09-09 DIAGNOSIS — K5903 Drug induced constipation: Secondary | ICD-10-CM

## 2014-09-09 DIAGNOSIS — Z87440 Personal history of urinary (tract) infections: Secondary | ICD-10-CM | POA: Diagnosis not present

## 2014-09-09 DIAGNOSIS — K5909 Other constipation: Secondary | ICD-10-CM | POA: Diagnosis not present

## 2014-09-09 DIAGNOSIS — I10 Essential (primary) hypertension: Secondary | ICD-10-CM | POA: Diagnosis not present

## 2014-09-09 DIAGNOSIS — R8271 Bacteriuria: Secondary | ICD-10-CM | POA: Insufficient documentation

## 2014-09-09 DIAGNOSIS — F17210 Nicotine dependence, cigarettes, uncomplicated: Secondary | ICD-10-CM

## 2014-09-09 MED ORDER — HYDROCODONE-ACETAMINOPHEN 10-325 MG PO TABS: 1.0000 | ORAL_TABLET | Freq: Three times a day (TID) | ORAL | Status: DC | PRN

## 2014-09-09 MED ORDER — FENTANYL 50 MCG/HR TD PT72: 50.0000 ug | MEDICATED_PATCH | TRANSDERMAL | Status: DC

## 2014-09-09 MED ORDER — ATENOLOL 50 MG PO TABS: 50.0000 mg | ORAL_TABLET | Freq: Every day | ORAL | Status: DC

## 2014-09-09 NOTE — Patient Instructions (Signed)
General Instructions:  If you develop pain with urination or fever please call the clinic.  I am increasing your atenolol to 50mg  daily. Thank you for bringing your medicines today. This helps [name] keep you safe from mistakes.   Progress Toward Treatment Goals:  Treatment Goal 11/20/2013  Blood pressure unchanged  Stop smoking smoking the same amount  Prevent falls at goal    Self Care Goals & Plans:  Self Care Goal 09/09/2014  Manage my medications take my medicines as prescribed; bring my medications to every visit; refill my medications on time  Monitor my health -  Eat healthy foods drink diet soda or water instead of juice or soda; eat more vegetables; eat baked foods instead of fried foods; eat fruit for snacks and desserts; eat foods that are low in salt  Be physically active -  Stop smoking -  Meeting treatment goals -    No flowsheet data found.   Care Management & Community Referrals:  Referral 11/20/2013  Referrals made for care management support none needed  Referrals made to community resources none

## 2014-09-09 NOTE — Progress Notes (Signed)
[facility] Subjective:   Patient ID: Jessica [name] female   DOB: 05/30/1940 74 y.o.   MRN: [id number]  HPI: [name] is a 74 y.o. female with a PMH detailed below who presents for 3 month follow up.  She saw Dr Jessica [name] for follow up last week for her HIV therapy, he is thinking about changing her to a new TAF therapy from [place] but has not yet (insurance issues) she remains well controlled from an HIV standpoint.  He did note her BP was elevated and she is compliant with her medications.  Today she again brings all of her medications and reports compliance.  Her daughter confirms this.  Her BP is again elevated today but she reports no symptoms associated with that.  The day after her visit with Dr Jessica [name] she did go to the ED for a syncopal episode after straining to have a bowel movement.  She apparently was also found to have an E Coli UTI and given ceftriaxone in the ED and then given a Rx for keflex which she has been taking.  She denies any dysuria or changes in urinary frequency, she reports that she did not have these symptoms at the time of the ED visit and does not feel that she had a UTI.    Past Medical History  Diagnosis Date  . Human immunodeficiency virus disease 03/27/1986  . Tobacco abuse 02/19/2012  . Essential hypertension 07/20/2006  . Hyperlipidemia LDL goal < 100 04/05/2012  . 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 Lumbar degenerative disc disease 07/20/2006    With chronic back pain   . 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 Small bowel obstruction due to adhesions 02/08/2012    s/p Exploratory laparotomy, lysis of adhesions 02/12/12    . Constipation due to pain medication 04/27/2010  . Diverticulosis 02/08/2012    Extensive left-sided diverticula on colonoscopy March 2012 per Dr. Gala Romney   . Genital herpes 07/20/2006  .  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 Seborrhea 09/01/2010  . Anxiety 04/05/2012  . Bursitis of right shoulder 07/12/2012    s/p shoulder injection 07/12/2012   . Voiding dysfunction     s/p cystoscopy and meatal dilation Dec 2005  . Cataract of right eye   . Heart murmur 1961  . Blood transfusion without reported diagnosis   . Right rotator cuff tear 02/01/2013    Responds to periodic steroid injections  . Periumbilical hernia 06/14/100    1 cm left periumbilical abdominal wall defect  . 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  Hearing aides with possible masking of tinnitus recommended but patient wished to defer secondary to finances.   Current Outpatient Prescriptions  Medication Sig Dispense Refill  . acyclovir (ZOVIRAX) 400 MG tablet TAKE 1 TABLET BY MOUTH THREE TIMES DAILY AS NEEDED FOR OUTBREAKS FOR 7 DAYS 21 tablet 3  . albuterol (PROVENTIL HFA;VENTOLIN HFA) 108 (90 BASE) MCG/ACT inhaler Inhale 1-2 puffs into the lungs every 6 (six) hours as needed for wheezing. 1 Inhaler 0  . alendronate (FOSAMAX) 70 MG tablet Take 1 tablet (70 mg total) by mouth once a week. (Patient taking differently: Take 70 mg  by mouth once a week. Takes on Sunday) 12 tablet 3  . ALPRAZolam (XANAX) 1 MG tablet Take 1 tablet (1 mg total) by mouth 3 (three) times daily as needed for anxiety. 90 tablet 5  . amLODipine (NORVASC) 10 MG tablet TAKE 1 TABLET BY MOUTH DAILY. 90 tablet 3  . aspirin 81 MG EC tablet Take 81 mg by mouth daily.      Marland Kitchen atenolol (TENORMIN) 50 MG tablet Take 1 tablet (50 mg total) by mouth daily. 90 tablet 0  . ATRIPLA 600-200-300 MG per tablet TAKE 1 TABLET BY MOUTH EVERY NIGHT AT BEDTIME 30 tablet 5  . benazepril (LOTENSIN) 40 MG tablet TAKE 1 TABLET BY MOUTH EVERY DAY 30 tablet 11  . Calcium Carbonate Antacid (ROLAIDS EXTRA STRENGTH PO) Take 1 tablet  by mouth as needed (acid reflux).     . calcium-vitamin D (OSCAL WITH D) 500-200 MG-UNIT per tablet Take 1 tablet by mouth 2 (two) times daily.    . COMBIGAN 0.2-0.5 % ophthalmic solution Place 1 drop into the left eye 2 (two) times daily.    . cyclobenzaprine (FLEXERIL) 10 MG tablet Take 1 tablet (10 mg total) by mouth 3 (three) times daily as needed for muscle spasms. 90 tablet 11  . docusate sodium (COLACE) 100 MG capsule Take 100 mg by mouth 2 (two) times daily.    . fentaNYL (DURAGESIC) 50 MCG/HR Place 1 patch (50 mcg total) onto the skin every 3 (three) days. 10 patch 0  . HYDROcodone-acetaminophen (NORCO) 10-325 MG per tablet Take 1 tablet by mouth every 8 (eight) hours as needed for moderate pain. 60 tablet 0  . ibuprofen (ADVIL,MOTRIN) 200 MG tablet Take 400 mg by mouth 3 (three) times daily as needed for pain.    Marland Kitchen lovastatin (MEVACOR) 40 MG tablet TAKE 1 TABLET BY MOUTH AT BEDTIME 30 tablet 5  . magnesium hydroxide (MILK OF MAGNESIA) 800 MG/5ML suspension Take 30 mLs by mouth daily. (Patient taking differently: Take 30 mLs by mouth daily as needed for constipation or heartburn. ) 240 mL 0  . megestrol (MEGACE) 40 MG tablet Take 2 tablets (80 mg total) by mouth 2 (two) times daily. 360 tablet 3  . Multiple Vitamin (MULTIVITAMIN WITH MINERALS) TABS Take 1 tablet by mouth daily.    . sorbitol 70 % solution Take 15 mLs by mouth daily as needed. (Patient not taking: Reported on 09/03/2014) 473 mL 0  . spironolactone (ALDACTONE) 50 MG tablet Take 1 tablet (50 mg total) by mouth daily. 90 tablet 3  . triamcinolone ointment (KENALOG) 0.1 % APPLY TWICE DAILY 454 g 3   No current facility-administered medications for this visit.   Family History  Problem Relation Age of Onset  . Kidney failure Mother   . Diabetes Mother   . Hypertension Mother   . Heart disease Mother   . Glaucoma Father   . Congestive Heart Failure Sister   . Diabetes Sister   . Kidney disease Sister   . Diabetes Brother    . Unexplained death Brother 3    Automobile accident  . Hypothyroidism Daughter   . Arthritis Daughter     Neck/Back  . Healthy Son   . HIV/AIDS Brother   . HIV Daughter   . Kidney disease Daughter   . Arthritis Son     Knee   History   Social History  . Marital Status: Widowed    Spouse Name: N/A  . Number of Children: 4  .  Years of Education: 2y college   Occupational History  . retired     previously worked as a Designer, fashion/clothing for [employer] History Main Topics  . Smoking status: Current Every Day Smoker -- 0.20 packs/day for 50 years    Types: Cigars, Cigarettes  . Smokeless tobacco: Never Used     Comment: using chantix- smokes black and mild cigars  . Alcohol Use: No     Comment: "last drink of alcohol ~ 1977"  . Drug Use: No     Comment: cutting back on chantix, 1 cigarette this morning  . Sexual Activity: No   Other Topics Concern  . None   Social History Narrative   Lives in [place], [place] with daughter and nephew   Review of Systems: Review of Systems  Constitutional: Negative for fever, chills and malaise/fatigue.  Eyes: Negative for blurred vision.  Respiratory: Negative for cough and shortness of breath.   Cardiovascular: Negative for chest pain.  Gastrointestinal: Positive for constipation.  Genitourinary: Negative for dysuria and frequency.  Musculoskeletal: Positive for falls (1 fall 6/23).     Objective:  Physical Exam: Filed Vitals:   09/09/14 1110  BP: 187/86  Pulse: 71  Temp: 98.5 F (36.9 C)  TempSrc: Oral  Weight: 138 lb 11.2 oz (62.914 kg)  SpO2: 100%  Physical Exam  Constitutional: She is well-developed, well-nourished, and in no distress.  Cardiovascular: Normal rate and regular rhythm.   Pulmonary/Chest: Effort normal and breath sounds normal.  Abdominal: Soft. Bowel sounds are normal. There is no tenderness.  Musculoskeletal: She exhibits no edema.  Nursing note and vitals reviewed.   Assessment & Plan:  Case  discussed with Dr. Dareen Piano  Essential hypertension - Systolic BP remains very elevated (even for age) - Will continue benazepril 40mg  daily, Amlodipine 10mg  daily, and Spironolactone 50mg  daily, as her HR is in the 70s will start with increasing Atenolol to 50mg  daily. - Will have her follow up in about a month where atenolol could be further titrated.  Constipation due to pain medication - Will have her continue Colace 100mg  BID (advised she may increase up to 200mg  BID if needed), she can continue mild of magnesia and sorbitol. - Per daughter she does not have much water consumption advised that she increase this. - Advised to add fiber supplement.  Lumbar degenerative disc disease - She is requesting a refill of fentanyl and hydrocodone. I do not see any red flag behavior.  I will refill 1 month of each of these medications.  She is due for follow up within the month and further refills can be given then  Asymptomatic bacteriuria - She was treated with Ceftriaxone in the ED and discharged with keflex. UCx has resulted in E Coli that is sensitive to Ceftriaxone but resistant to keflex.  She does not have symptoms now and I suspect she did not have much symptoms in the ED. - I have instructed her to stop Keflex.  Given her return precautions.  If symptoms return I would reculture her and prescribe Cipro as the [facility] was sensitive to this.    Medications Ordered Meds ordered this encounter  Medications  . atenolol (TENORMIN) 50 MG tablet    Sig: Take 1 tablet (50 mg total) by mouth daily.    Dispense:  90 tablet    Refill:  0  . HYDROcodone-acetaminophen (NORCO) 10-325 MG per tablet    Sig: Take 1 tablet by mouth every 8 (eight) hours as  needed for moderate pain.    Dispense:  60 tablet    Refill:  0    Please do not refill before 09/17/2014  . fentaNYL (DURAGESIC) 50 MCG/HR    Sig: Place 1 patch (50 mcg total) onto the skin every 3 (three) days.    Dispense:  10 patch    Refill:   0    Please do not refill before 09/17/2014   Other Orders No orders of the defined types were placed in this encounter.   Follow Up: Return 2-4 weeks for BP recheck.

## 2014-09-09 NOTE — Assessment & Plan Note (Signed)
-   Will have her continue Colace 100mg  BID (advised she may increase up to 200mg  BID if needed), she can continue mild of magnesia and sorbitol. - Per daughter she does not have much water consumption advised that she increase this. - Advised to add fiber supplement.

## 2014-09-09 NOTE — Assessment & Plan Note (Signed)
-   Systolic BP remains very elevated (even for age) - Will continue benazepril 40mg  daily, Amlodipine 10mg  daily, and Spironolactone 50mg  daily, as her HR is in the 70s will start with increasing Atenolol to 50mg  daily. - Will have her follow up in about a month where atenolol could be further titrated.

## 2014-09-09 NOTE — Assessment & Plan Note (Signed)
-   She is requesting a refill of fentanyl and hydrocodone. I do not see any red flag behavior.  I will refill 1 month of each of these medications.  She is due for follow up within the month and further refills can be given then

## 2014-09-09 NOTE — Progress Notes (Signed)
INTERNAL MEDICINE TEACHING ATTENDING ADDENDUM - Rickita Forstner, MD: I reviewed and discussed at the time of visit with the resident Dr. Hoffman, the patient's medical history, physical examination, diagnosis and results of pertinent tests and treatment and I agree with the patient's care as documented.  

## 2014-09-09 NOTE — Assessment & Plan Note (Signed)
-   She was treated with Ceftriaxone in the ED and discharged with keflex. UCx has resulted in E Coli that is sensitive to Ceftriaxone but resistant to keflex.  She does not have symptoms now and I suspect she did not have much symptoms in the ED. - I have instructed her to stop Keflex.  Given her return precautions.  If symptoms return I would reculture her and prescribe Cipro as the [facility] was sensitive to this.

## 2014-09-16 DIAGNOSIS — H2513 Age-related nuclear cataract, bilateral: Secondary | ICD-10-CM | POA: Diagnosis not present

## 2014-09-16 DIAGNOSIS — H4011X1 Primary open-angle glaucoma, mild stage: Secondary | ICD-10-CM | POA: Diagnosis not present

## 2014-09-21 ENCOUNTER — Other Ambulatory Visit: Payer: Self-pay | Admitting: Infectious Disease

## 2014-09-24 ENCOUNTER — Ambulatory Visit (HOSPITAL_COMMUNITY)
Admission: RE | Admit: 2014-09-24 | Discharge: 2014-09-24 | Disposition: A | Discharge status: Home / Self Care | Payer: Medicare Other | Source: Ambulatory Visit | Attending: Internal Medicine | Admitting: Internal Medicine

## 2014-09-24 ENCOUNTER — Encounter: Payer: Self-pay | Admitting: Internal Medicine

## 2014-09-24 ENCOUNTER — Ambulatory Visit (INDEPENDENT_AMBULATORY_CARE_PROVIDER_SITE_OTHER): Payer: Medicare Other | Admitting: Internal Medicine

## 2014-09-24 VITALS — BP 156/63 | HR 73 | Temp 98.1°F | Wt 139.4 lb

## 2014-09-24 DIAGNOSIS — E876 Hypokalemia: Secondary | ICD-10-CM

## 2014-09-24 DIAGNOSIS — Z21 Asymptomatic human immunodeficiency virus [HIV] infection status: Secondary | ICD-10-CM | POA: Diagnosis not present

## 2014-09-24 DIAGNOSIS — M51369 Other intervertebral disc degeneration, lumbar region without mention of lumbar back pain or lower extremity pain: Secondary | ICD-10-CM

## 2014-09-24 DIAGNOSIS — R109 Unspecified abdominal pain: Secondary | ICD-10-CM | POA: Insufficient documentation

## 2014-09-24 DIAGNOSIS — R1012 Left upper quadrant pain: Secondary | ICD-10-CM | POA: Diagnosis not present

## 2014-09-24 DIAGNOSIS — K5909 Other constipation: Secondary | ICD-10-CM

## 2014-09-24 DIAGNOSIS — R1084 Generalized abdominal pain: Secondary | ICD-10-CM

## 2014-09-24 DIAGNOSIS — M5136 Other intervertebral disc degeneration, lumbar region: Secondary | ICD-10-CM

## 2014-09-24 LAB — COMPLETE METABOLIC PANEL WITH GFR
ALK PHOS: 52 U/L (ref 39–117)
ALT: 11 U/L (ref 0–35)
AST: 24 U/L (ref 0–37)
Albumin: 4.3 g/dL (ref 3.5–5.2)
BILIRUBIN TOTAL: 0.4 mg/dL (ref 0.2–1.2)
BUN: 8 mg/dL (ref 6–23)
CALCIUM: 9.6 mg/dL (ref 8.4–10.5)
CO2: 16 meq/L — AB (ref 19–32)
Chloride: 112 mEq/L (ref 96–112)
Creat: 0.67 mg/dL (ref 0.50–1.10)
GFR, EST NON AFRICAN AMERICAN: 88 mL/min
GLUCOSE: 87 mg/dL (ref 70–99)
Potassium: 5.2 mEq/L (ref 3.5–5.3)
Sodium: 142 mEq/L (ref 135–145)
Total Protein: 7.7 g/dL (ref 6.0–8.3)

## 2014-09-24 MED ORDER — FENTANYL 50 MCG/HR TD PT72: 50.0000 ug | MEDICATED_PATCH | TRANSDERMAL | Status: DC

## 2014-09-24 MED ORDER — HYDROCODONE-ACETAMINOPHEN 10-325 MG PO TABS: 1.0000 | ORAL_TABLET | Freq: Three times a day (TID) | ORAL | Status: DC | PRN

## 2014-09-24 NOTE — Patient Instructions (Signed)
1. I will call you with results of labs and xray.  Seek emergency care if you develop severe pain, vomiting, stop having bowel movements or cannot pass gas.   2. Please take all medications as prescribed.    3. If you have worsening of your symptoms or new symptoms arise, please call the clinic ([phone]), or go to the ER immediately if symptoms are severe.

## 2014-09-24 NOTE — Progress Notes (Signed)
Subjective:    Patient ID: Jessica [name], female    DOB: 06/01/1940, 74 y.o.   MRN: [id number]  HPI Comments: Jessica [name] is a 74 year old woman with PMH as below here with c/o.  Please see problem based charting for assessment and plan.   Abdominal Pain This is a new problem. The current episode started in the past 7 days. The onset quality is sudden. The problem occurs constantly. The most recent episode lasted 2 days. The problem has been unchanged. The pain is located in the periumbilical region. The pain is at a severity of 5/10. The pain is moderate. The quality of the pain is dull. Associated symptoms include constipation and nausea. Pertinent negatives include no anorexia, belching, diarrhea, dysuria, fever, hematuria, melena, vomiting or weight loss. Associated symptoms comments: Occasional nausea.. The pain is aggravated by movement and deep breathing. She has tried nothing for the symptoms. Her past medical history is significant for abdominal surgery and GERD. There is no history of colon cancer, Crohn's disease, gallstones, irritable bowel syndrome, pancreatitis, PUD or ulcerative colitis.  Cough Pertinent negatives include no chest pain, chills, fever or weight loss.    Past Medical History  Diagnosis Date  . Human immunodeficiency virus disease 03/27/1986  . Tobacco abuse 02/19/2012  . Essential hypertension 07/20/2006  . Hyperlipidemia LDL goal < 100 04/05/2012  . 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 Lumbar degenerative disc disease 07/20/2006    With chronic back pain   . 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 Small bowel obstruction due to adhesions 02/08/2012    s/p Exploratory laparotomy, lysis of adhesions 02/12/12    . Constipation due to pain medication 04/27/2010  . Diverticulosis 02/08/2012    Extensive left-sided diverticula on colonoscopy March 2012  per Dr. Gala Romney   . Genital herpes 07/20/2006  . 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 Seborrhea 09/01/2010  . Anxiety 04/05/2012  . Bursitis of right shoulder 07/12/2012    s/p shoulder injection 07/12/2012   . Voiding dysfunction     s/p cystoscopy and meatal dilation Dec 2005  . Cataract of right eye   . Heart murmur 1961  . Blood transfusion without reported diagnosis   . Right rotator cuff tear 02/01/2013    Responds to periodic steroid injections  . Periumbilical hernia 05/13/2949    1 cm left periumbilical abdominal wall defect  . 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  Hearing aides with possible masking of tinnitus recommended but patient wished to defer secondary to finances.   Current Outpatient Prescriptions on File Prior to Visit  Medication Sig Dispense Refill  . acyclovir (ZOVIRAX) 400 MG tablet TAKE 1 TABLET BY MOUTH THREE TIMES DAILY AS NEEDED FOR OUTBREAKS FOR 7 DAYS 21 tablet 3  . albuterol (PROVENTIL HFA;VENTOLIN HFA) 108 (90 BASE) MCG/ACT inhaler Inhale 1-2 puffs into the lungs every 6 (six) hours as needed for wheezing. 1 Inhaler 0  . alendronate (FOSAMAX) 70 MG tablet Take 1 tablet (70 mg total) by mouth once a week. (Patient taking differently: Take 70 mg by mouth once a week. Takes on Sunday) 12 tablet 3  . ALPRAZolam (XANAX) 1 MG tablet Take 1 tablet (1 mg  total) by mouth 3 (three) times daily as needed for anxiety. 90 tablet 5  . amLODipine (NORVASC) 10 MG tablet TAKE 1 TABLET BY MOUTH DAILY. 90 tablet 3  . aspirin 81 MG EC tablet Take 81 mg by mouth daily.      Marland Kitchen atenolol (TENORMIN) 50 MG tablet Take 1 tablet (50 mg total) by mouth daily. 90 tablet 0  . ATRIPLA 600-200-300 MG per tablet TAKE 1 TABLET BY MOUTH EVERY NIGHT AT BEDTIME 30 tablet 5  . benazepril (LOTENSIN) 40 MG tablet TAKE 1 TABLET BY MOUTH EVERY DAY 30 tablet 11    . Calcium Carbonate Antacid (ROLAIDS EXTRA STRENGTH PO) Take 1 tablet by mouth as needed (acid reflux).     . calcium-vitamin D (OSCAL WITH D) 500-200 MG-UNIT per tablet Take 1 tablet by mouth 2 (two) times daily.    . COMBIGAN 0.2-0.5 % ophthalmic solution Place 1 drop into the left eye 2 (two) times daily.    . cyclobenzaprine (FLEXERIL) 10 MG tablet Take 1 tablet (10 mg total) by mouth 3 (three) times daily as needed for muscle spasms. 90 tablet 11  . docusate sodium (COLACE) 100 MG capsule Take 100 mg by mouth 2 (two) times daily.    . fentaNYL (DURAGESIC) 50 MCG/HR Place 1 patch (50 mcg total) onto the skin every 3 (three) days. 10 patch 0  . HYDROcodone-acetaminophen (NORCO) 10-325 MG per tablet Take 1 tablet by mouth every 8 (eight) hours as needed for moderate pain. 60 tablet 0  . ibuprofen (ADVIL,MOTRIN) 200 MG tablet Take 400 mg by mouth 3 (three) times daily as needed for pain.    Marland Kitchen lovastatin (MEVACOR) 40 MG tablet TAKE 1 TABLET BY MOUTH AT BEDTIME 30 tablet 5  . magnesium hydroxide (MILK OF MAGNESIA) 800 MG/5ML suspension Take 30 mLs by mouth daily. (Patient taking differently: Take 30 mLs by mouth daily as needed for constipation or heartburn. ) 240 mL 0  . megestrol (MEGACE) 40 MG tablet Take 2 tablets (80 mg total) by mouth 2 (two) times daily. 360 tablet 3  . Multiple Vitamin (MULTIVITAMIN WITH MINERALS) TABS Take 1 tablet by mouth daily.    . sorbitol 70 % solution Take 15 mLs by mouth daily as needed. (Patient not taking: Reported on 09/03/2014) 473 mL 0  . spironolactone (ALDACTONE) 50 MG tablet Take 1 tablet (50 mg total) by mouth daily. 90 tablet 3  . triamcinolone ointment (KENALOG) 0.1 % APPLY TO AFFECTED AREA TWICE DAILY 454 g 3   No current facility-administered medications on file prior to visit.     Review of Systems  Constitutional: Negative for fever, chills, weight loss and appetite change.  Respiratory: Positive for cough.   Cardiovascular: Negative for chest  pain.  Gastrointestinal: Positive for nausea, abdominal pain and constipation. Negative for vomiting, diarrhea, melena and anorexia.  Genitourinary: Negative for dysuria and hematuria.  Neurological: Positive for syncope and light-headedness.       Describes OH when sitting to standing.       Filed Vitals:   09/24/14 1038  BP: 156/63  Pulse: 73  Temp: 98.1 F (36.7 C)  TempSrc: Oral  Weight: 139 lb 6.4 oz (63.231 kg)  SpO2: 100%     Objective:   Physical Exam  Constitutional: She is oriented to person, place, and time. She appears well-developed. No distress.  HENT:  Head: Normocephalic and atraumatic.  Mouth/Throat: Oropharynx is clear and moist. No oropharyngeal exudate.  Eyes: EOM are normal. Pupils are  equal, round, and reactive to light.  Neck: Neck supple.  Cardiovascular: Normal rate, regular rhythm and normal heart sounds.  Exam reveals no gallop and no friction rub.   No murmur heard. Pulmonary/Chest: Effort normal and breath sounds normal. No respiratory distress. She has no wheezes. She has no rales.  Abdominal: Soft. Bowel sounds are normal. She exhibits no distension and no mass. There is tenderness. There is no rebound and no guarding.  Mild diffuse abdominal TTP  Musculoskeletal: Normal range of motion. She exhibits no edema or tenderness.  Neurological: She is alert and oriented to person, place, and time. No cranial nerve deficit.  Skin: Skin is warm. She is not diaphoretic.  Psychiatric: She has a normal mood and affect. Her behavior is normal. Judgment and thought content normal.  Vitals reviewed.         Assessment & Plan:  Please see problem based charting for assessment and plan.

## 2014-09-25 DIAGNOSIS — R109 Unspecified abdominal pain: Secondary | ICD-10-CM | POA: Insufficient documentation

## 2014-09-25 NOTE — Assessment & Plan Note (Addendum)
She reports waking up with abdominal pain 2 days ago.  Constant but has not interfered with eating.  No N/V.  She has chronic constipation but last BM was this morning.  Abdomen soft, mild diffuse tenderness.  She reports point tenderness LUQ/beneath left breast/lower rib cage.  There is no palpable nodule.  Recent abd/chest xray w/o bony abnormality.  She is afebrile, non-toxic appearing, no peritoneal signs, appears very comfortable during exam making acute abdominal etiology unlikely.  She is on opioids (fentanyl and Vicodin) for chronic pain and takes a bowel regimen for chronic constipation.  I think her pain is likely due to constipation.  She has hx of SBO but she says this pain is not as bad.  Periumbilical hernia felt on exam in 05/2014 but not evident on [name] and I cannot appreciate it today.  I doubt SBO given reassuring abdominal exam and no vomiting however she is at risk given surgical hx and hx of SBO.  - 2view abd - no SBO or free air, moderate stool burden  - CMP - unremarkable - no other clear etiology of mild abdominal pain besides constipation (likely opioid induced), she will try adding miralax to Colace to see if this helps with constipation - return precautions given

## 2014-09-28 NOTE — Addendum Note (Signed)
Addended by: Gilles Chiquito B on: 09/28/2014 09:47 AM   Modules accepted: Level of Service

## 2014-09-28 NOTE — Progress Notes (Signed)
Internal Medicine Clinic Attending  Case discussed with Dr. Wilson soon after the resident saw the patient.  We reviewed the resident's history and exam and pertinent patient test results.  I agree with the assessment, diagnosis, and plan of care documented in the resident's note.  

## 2014-10-14 ENCOUNTER — Ambulatory Visit: Payer: Medicare Other

## 2014-11-02 ENCOUNTER — Encounter: Payer: Self-pay | Admitting: *Deleted

## 2014-11-13 ENCOUNTER — Other Ambulatory Visit: Payer: Self-pay | Admitting: Infectious Disease

## 2014-11-13 DIAGNOSIS — B029 Zoster without complications: Secondary | ICD-10-CM

## 2014-11-19 ENCOUNTER — Encounter: Payer: Self-pay | Admitting: Internal Medicine

## 2014-11-19 ENCOUNTER — Ambulatory Visit (INDEPENDENT_AMBULATORY_CARE_PROVIDER_SITE_OTHER): Payer: Medicare Other | Admitting: Internal Medicine

## 2014-11-19 ENCOUNTER — Other Ambulatory Visit: Payer: Self-pay | Admitting: *Deleted

## 2014-11-19 VITALS — Temp 97.7°F | Wt 135.8 lb

## 2014-11-19 DIAGNOSIS — M810 Age-related osteoporosis without current pathological fracture: Secondary | ICD-10-CM | POA: Diagnosis not present

## 2014-11-19 DIAGNOSIS — I10 Essential (primary) hypertension: Secondary | ICD-10-CM

## 2014-11-19 DIAGNOSIS — Z0000 Encounter for general adult medical examination without abnormal findings: Secondary | ICD-10-CM

## 2014-11-19 DIAGNOSIS — B20 Human immunodeficiency virus [HIV] disease: Secondary | ICD-10-CM | POA: Diagnosis not present

## 2014-11-19 DIAGNOSIS — M51369 Other intervertebral disc degeneration, lumbar region without mention of lumbar back pain or lower extremity pain: Secondary | ICD-10-CM

## 2014-11-19 DIAGNOSIS — E785 Hyperlipidemia, unspecified: Secondary | ICD-10-CM | POA: Diagnosis not present

## 2014-11-19 DIAGNOSIS — I739 Peripheral vascular disease, unspecified: Secondary | ICD-10-CM

## 2014-11-19 DIAGNOSIS — K5903 Drug induced constipation: Secondary | ICD-10-CM

## 2014-11-19 DIAGNOSIS — T50905A Adverse effect of unspecified drugs, medicaments and biological substances, initial encounter: Secondary | ICD-10-CM | POA: Diagnosis not present

## 2014-11-19 DIAGNOSIS — F411 Generalized anxiety disorder: Secondary | ICD-10-CM | POA: Diagnosis not present

## 2014-11-19 DIAGNOSIS — Z720 Tobacco use: Secondary | ICD-10-CM | POA: Diagnosis not present

## 2014-11-19 DIAGNOSIS — Z23 Encounter for immunization: Secondary | ICD-10-CM

## 2014-11-19 DIAGNOSIS — K5909 Other constipation: Secondary | ICD-10-CM | POA: Diagnosis not present

## 2014-11-19 DIAGNOSIS — M5136 Other intervertebral disc degeneration, lumbar region: Secondary | ICD-10-CM

## 2014-11-19 MED ORDER — HYDROCODONE-ACETAMINOPHEN 10-325 MG PO TABS: 1.0000 | ORAL_TABLET | Freq: Three times a day (TID) | ORAL | Status: DC | PRN

## 2014-11-19 MED ORDER — FENTANYL 50 MCG/HR TD PT72: 50.0000 ug | MEDICATED_PATCH | TRANSDERMAL | Status: DC

## 2014-11-19 NOTE — Progress Notes (Signed)
   Subjective:    Patient ID: Jessica [name], female    DOB: [birth year], 74 y.o.   MRN: [id number]  HPI  Jessica [name] is here for follow-up of hypertension and chronic pain control assessment. Please see the A&P for the status of the pt's chronic medical problems.  Review of Systems  Constitutional: Positive for fatigue. Negative for activity change and unexpected weight change.       Has been feeling more fatigued since death of her daughter 6 months ago.  Respiratory: Negative for chest tightness, shortness of breath and wheezing.   Cardiovascular: Negative for chest pain and leg swelling.  Gastrointestinal: Positive for nausea, abdominal pain, diarrhea and constipation. Negative for vomiting.       Chronic abdominal pain and constipation unchanged.  Had 1 episode of diarrhea and nausea w/o vomiting this AM.  Musculoskeletal: Positive for back pain and arthralgias. Negative for joint swelling.  Skin: Negative for rash and wound.  Neurological: Positive for syncope and light-headedness.       Continued lightheadedness.  Recent syncopal episode seen in ED and felt to be related to straining to stool which the patient agrees was the likely problem.  Psychiatric/Behavioral: Positive for sleep disturbance.       Has had even more difficulty sleeping since death of her daughter.      Objective:   Physical Exam  Constitutional: She is oriented to person, place, and time. She appears well-developed and well-nourished. No distress.  HENT:  Head: Normocephalic and atraumatic.  Eyes: Conjunctivae are normal. Right eye exhibits no discharge. Left eye exhibits no discharge. No scleral icterus.  Cardiovascular: Normal rate, regular rhythm and normal heart sounds.  Exam reveals no gallop and no friction rub.   No murmur heard. Pulmonary/Chest: Effort normal and breath sounds normal. No respiratory distress. She has no wheezes. She has no rales.  Musculoskeletal: Normal range of  motion. She exhibits no edema or tenderness.  Neurological: She is alert and oriented to person, place, and time. She exhibits normal muscle tone.  Skin: Skin is warm and dry. No rash noted. She is not diaphoretic. No erythema.  Psychiatric: She has a normal mood and affect. Her behavior is normal. Judgment and thought content normal.  Nursing note and vitals reviewed.     Assessment & Plan:   Please see problem oriented charting.

## 2014-11-19 NOTE — Assessment & Plan Note (Signed)
She continues to have chronic constipation due to her narcotic pain medications. She currently is using Colace, milk of magnesia, sorbitol, and MiraLAX on a when necessary basis with satisfactory results. We will therefore continue with this regimen allowing her to modified as needed for symptom management. We will reassess her constipation on this regimen at the follow-up visit

## 2014-11-19 NOTE — Assessment & Plan Note (Signed)
At this point she is asymptomatic. We will continue with risk factor modification including trying to maintain blood pressure is close to goal as possible as well as continue her statin therapy. She will also be maintained on an aspirin a day. We will reassess for symptoms at the follow-up visit.

## 2014-11-19 NOTE — Assessment & Plan Note (Signed)
She continues to smoke. Her insurance apparently did not pay for the nicotine gum. When I assessed her willingness to quit at this time she said she was not ready. Some of this may be related to the recent death of her daughter. We will bring the issue back up at the follow-up visit.

## 2014-11-19 NOTE — Assessment & Plan Note (Signed)
She continues to have pain despite her extensive pharmacologic regimen. Currently she is on a fentanyl patch at 50 g every 3 days, hydrocodone-acetaminophen 10-325 mg 1 tablet every 8 hours as needed dispense #90 per month, ibuprofen 400 mg every 8 hours as needed, and cyclobenzaprine 10 mg by mouth every 8 hours as needed. Her pharmacy sent an unsolicited compounded cream for my signature. I discussed this with [facility]. [facility] and we decided it was unlikely to be helpful or necessary at this time. Of note, she does state she occasionally smokes marijuana when the pain is significant and she feels this helps her more than anything else. We reviewed all of her pain medications and she feels they are all efficacious in controlling her pain is much as possible. With this regimen she is able to maintain function and remains quite active. She was given three 1 month prescriptions for the fentanyl and hydrocodone so she would not have to return to clinic on a monthly basis to pick them up given the distance she lives from here. A urine drug screen was obtained at this visit and is pending at the time of this dictation. We will continue the regimen as noted above and reassess her pain at the return visit.

## 2014-11-19 NOTE — Assessment & Plan Note (Signed)
She continues to have mild systolic hypertension with her blood pressure today being 156/63. This is on amlodipine 10 mg by mouth daily, atenolol 50 mg by mouth daily, benazepril 40 mg by mouth daily, and spironolactone 50 mg by mouth daily. Of note, she has had lightheadedness upon standing and had a syncopal episode after straining to stool several months ago. Her orthostatic blood pressure today while lying down was 162/66 with a pulse of 71 and while standing up was 164/64 with a pulse of 67. She therefore is not orthostatic today. Given her mild systolic hypertension and concomitant dizziness with even a syncopal episode we will continue her blood pressure medications at their current doses rather than escalating. At this point I am willing to tolerate a slightly elevated blood pressure in order to avoid further syncopal episodes or significant decrease in the quality of her life secondary to dizziness. She remains active and I think that this is important. We will reassess her blood pressure at the follow-up visit.

## 2014-11-19 NOTE — Assessment & Plan Note (Signed)
She continues to take the alendronate 70 mg by mouth weekly as well as the calcium and vitamin D. It is been two and a half years since her last DEXA scan. We ordered a repeat DEXA scan to assess for stability in her osteoporosis while on the bisphosphonate therapy.

## 2014-11-19 NOTE — Assessment & Plan Note (Signed)
She is tolerating the lovastatin 40 mg by mouth daily without myalgias. Her most recent LDL was within the last 3 months and was 52. This is well within target and we will continue the lovastatin at 40 mg by mouth daily. No further lipid panels are necessary at this time given the dose recent American Heart Association guidelines.

## 2014-11-19 NOTE — Assessment & Plan Note (Signed)
She continues to be followed very closely at [facility] by Dr. Wendie Agreste. Her disease is exceptionally well controlled with no viral load detected and an excellent CD4 count. I will defer continued pharmacologic management of her HIV to her infectious disease specialists.

## 2014-11-19 NOTE — Patient Instructions (Signed)
It was good to see you today.  I am very sorry to hear about your daughter.  1) Keep taking the medications as you are.  I gave you three months of the fentanyl and hydrocodone prescriptions.  2) We gave you the flu shot today.  3) I will order the DEXA scan if it has been more than 2 years so Medicare will pay for it.  4) Let me know if you are ready to quit smoking.  I will see you in three months, sooner if necessary.

## 2014-11-19 NOTE — Assessment & Plan Note (Signed)
Anxiety has been exacerbated recently by the death of her daughter. Symptomatically this has resulted in worsening of her insomnia. She is continuing the alprazolam at 1 mg by mouth every 8 hours as needed. This has been relatively effective. She did make one note of an episode where she cut her tights without realizing it. I'm concerned she may have been drowsy or sleeping at this time. Fortunately she did not hurt herself in any other way and has had no other episodes. We will continue the alprazolam at the current dose. We will reassess for disturbed sleep or aberrant sleep behavior at the follow-up visit.

## 2014-11-19 NOTE — Assessment & Plan Note (Signed)
She received her flu vaccination today. Otherwise she is up-to-date on her health care maintenance.

## 2014-11-20 ENCOUNTER — Ambulatory Visit: Payer: Medicare Other | Admitting: Internal Medicine

## 2014-11-21 MED ORDER — ATENOLOL 50 MG PO TABS: 50.0000 mg | ORAL_TABLET | Freq: Every day | ORAL | Status: DC

## 2014-11-27 LAB — FENTANYL/NORFENTANYL, CONFIRM
FENTANYL UR CFM-MCNC: 10400 pg/mL
Fentanyl Ur Ql Cfm: POSITIVE — AB
Fentanyl+Norfentanyl Qr Ql Cfm: POSITIVE — AB
Norfentanyl Ur Cfm-Mcnc: 299500 pg/mL
Norfentanyl Ur Ql Cfm: POSITIVE — AB

## 2014-11-27 LAB — BENZODIAZEPINES CONFIRM, URINE
ALPRAZOLAM CONFIRM: 279 ng/mL
ALPRAZOLAM: POSITIVE — AB
BENZODIAZEPINES: POSITIVE ng/mL — AB
CLONAZEPAM: NEGATIVE
Flurazepam: NEGATIVE
LORAZEPAM: NEGATIVE
MIDAZOLAM: NEGATIVE
NORDIAZEPAM: NEGATIVE
OXAZEPAM UR: NEGATIVE
TEMAZEPAM: NEGATIVE
TRIAZOLAM: NEGATIVE

## 2014-11-27 LAB — CANNABINOID (GC/MS), URINE
CANNABINOID UR: POSITIVE — AB
Carboxy THC (GC/MS): 258 ng/mL

## 2014-11-27 LAB — PRESCRIPTION ABUSE MONITORING 17P, URINE
6-Acetylmorphine, Urine: NEGATIVE ng/mL
Amphetamine Scrn, Ur: NEGATIVE ng/mL
BARBITURATE SCREEN URINE: NEGATIVE ng/mL
Buprenorphine, Urine: NEGATIVE ng/mL
CREATININE(CRT), U: 183.3 mg/dL (ref 20.0–300.0)
Carisoprodol/Meprobamate, Ur: NEGATIVE ng/mL
Cocaine (Metab) Scrn, Ur: NEGATIVE ng/mL
EDDP, Urine: NEGATIVE ng/mL
MDMA Screen, Urine: NEGATIVE ng/mL
METHADONE SCREEN, URINE: NEGATIVE ng/mL
Meperidine Screen, Urine: NEGATIVE ng/mL
Nitrite Urine, Quantitative: NEGATIVE ug/mL
OPIATE SCREEN URINE: NEGATIVE ng/mL
OXYCODONE+OXYMORPHONE UR QL SCN: NEGATIVE ng/mL
PROPOXYPHENE SCREEN URINE: NEGATIVE ng/mL
Ph of Urine: 6.5 (ref 4.5–8.9)
Phencyclidine Qn, Ur: NEGATIVE ng/mL
SPECIFIC GRAVITY: 1.015
TAPENTADOL, URINE: NEGATIVE ng/mL
Tramadol Screen, Urine: NEGATIVE ng/mL

## 2014-11-30 NOTE — Progress Notes (Signed)
UDS Positive for:  Fentanyl Alprazolam Cannabis  All of above expected.  Negative for hydrocodone, but this is PRN medication and I failed to document her most recent dose at the time of the UDS collection.  Continue with the current longstanding regimen which enhances her daily function.

## 2014-12-10 NOTE — Progress Notes (Signed)
Notified [facility] via fax.  Howell, Michelle M, RN  

## 2014-12-17 ENCOUNTER — Other Ambulatory Visit: Payer: Self-pay | Admitting: Internal Medicine

## 2014-12-17 DIAGNOSIS — F411 Generalized anxiety disorder: Secondary | ICD-10-CM

## 2014-12-18 ENCOUNTER — Other Ambulatory Visit: Payer: Self-pay | Admitting: Infectious Disease

## 2014-12-21 ENCOUNTER — Other Ambulatory Visit: Payer: Self-pay | Admitting: Internal Medicine

## 2014-12-21 NOTE — Telephone Encounter (Signed)
Rx phoned into pharmacy.

## 2014-12-23 ENCOUNTER — Ambulatory Visit: Admission: RE | Admit: 2014-12-23 | Payer: Medicare Other | Source: Ambulatory Visit

## 2014-12-23 ENCOUNTER — Other Ambulatory Visit: Payer: Self-pay | Admitting: *Deleted

## 2014-12-23 ENCOUNTER — Other Ambulatory Visit: Payer: Medicare Other

## 2014-12-23 DIAGNOSIS — B20 Human immunodeficiency virus [HIV] disease: Secondary | ICD-10-CM

## 2014-12-23 DIAGNOSIS — E785 Hyperlipidemia, unspecified: Secondary | ICD-10-CM

## 2014-12-23 LAB — CBC WITH DIFFERENTIAL/PLATELET
BASOS ABS: 0 10*3/uL (ref 0.0–0.1)
Basophils Relative: 0 % (ref 0–1)
Eosinophils Absolute: 0.2 10*3/uL (ref 0.0–0.7)
Eosinophils Relative: 2 % (ref 0–5)
HEMATOCRIT: 39 % (ref 36.0–46.0)
HEMOGLOBIN: 13.6 g/dL (ref 12.0–15.0)
LYMPHS ABS: 5.5 10*3/uL — AB (ref 0.7–4.0)
LYMPHS PCT: 54 % — AB (ref 12–46)
MCH: 33 pg (ref 26.0–34.0)
MCHC: 34.9 g/dL (ref 30.0–36.0)
MCV: 94.7 fL (ref 78.0–100.0)
MPV: 8.7 fL (ref 8.6–12.4)
Monocytes Absolute: 0.1 10*3/uL (ref 0.1–1.0)
Monocytes Relative: 1 % — ABNORMAL LOW (ref 3–12)
NEUTROS ABS: 4.4 10*3/uL (ref 1.7–7.7)
Neutrophils Relative %: 43 % (ref 43–77)
Platelets: 389 10*3/uL (ref 150–400)
RBC: 4.12 MIL/uL (ref 3.87–5.11)
RDW: 15.1 % (ref 11.5–15.5)
WBC: 10.2 10*3/uL (ref 4.0–10.5)

## 2014-12-23 LAB — COMPLETE METABOLIC PANEL WITH GFR
ALBUMIN: 4.8 g/dL (ref 3.6–5.1)
ALT: 12 U/L (ref 6–29)
AST: 18 U/L (ref 10–35)
Alkaline Phosphatase: 55 U/L (ref 33–130)
BUN: 7 mg/dL (ref 7–25)
CHLORIDE: 114 mmol/L — AB (ref 98–110)
CO2: 19 mmol/L — ABNORMAL LOW (ref 20–31)
Calcium: 10.2 mg/dL (ref 8.6–10.4)
Creat: 0.72 mg/dL (ref 0.60–0.93)
GFR, Est African American: >89 mL/min (ref >=60)
GFR, Est Non African American: 84 mL/min (ref >=60)
Glucose, Bld: 107 mg/dL — ABNORMAL HIGH (ref 65–99)
Potassium: 4 mmol/L (ref 3.5–5.3)
Sodium: 143 mmol/L (ref 135–146)
TOTAL PROTEIN: 8.1 g/dL (ref 6.1–8.1)
Total Bilirubin: 0.4 mg/dL (ref 0.2–1.2)

## 2014-12-23 LAB — LIPID PANEL
CHOLESTEROL: 144 mg/dL (ref 125–200)
HDL: 65 mg/dL (ref >=46)
LDL Cholesterol: 66 mg/dL (ref <130)
Total CHOL/HDL Ratio: 2.2 Ratio (ref <=5.0)
Triglycerides: 67 mg/dL (ref <150)
VLDL: 13 mg/dL (ref <30)

## 2014-12-23 MED ORDER — EFAVIRENZ-EMTRICITAB-TENOFOVIR 600-200-300 MG PO TABS: 1.0000 | ORAL_TABLET | Freq: Every day | ORAL | Status: DC

## 2014-12-24 ENCOUNTER — Other Ambulatory Visit: Payer: Self-pay | Admitting: Internal Medicine

## 2014-12-24 LAB — RPR

## 2014-12-24 NOTE — Addendum Note (Signed)
Addended by: Dolan Amen D on: 12/24/2014 09:52 AM   Modules accepted: Orders

## 2014-12-25 LAB — HIV-1 RNA QUANT-NO REFLEX-BLD: HIV 1 RNA Quant: <20 copies/mL (ref <20)

## 2014-12-25 LAB — T-HELPER CELL (CD4) - (RCID CLINIC ONLY)
CD4 T CELL HELPER: 37 % (ref 33–55)
CD4 T Cell Abs: 1990 /uL (ref 400–2700)

## 2015-01-06 ENCOUNTER — Ambulatory Visit: Payer: Medicare Other | Admitting: Infectious Disease

## 2015-01-07 ENCOUNTER — Telehealth: Payer: Self-pay | Admitting: Internal Medicine

## 2015-01-07 NOTE — Telephone Encounter (Signed)
Pt daughter states mother cough for 1 week and want something to be call into the pharmacy. Offered appointment to come in, pt refuse.

## 2015-01-07 NOTE — Telephone Encounter (Signed)
Talked with daughter [name] and pt - has a clear productive cough past 2 weeks. Denies head congestion. Lives in [place] - prefers not to come to clinic. Suggest ER or Urgent Care in area. Pt prefers to wait and use OTC med. Will call if any change for an appt. Hilda Blades Salina Stanfield RN 01/07/15 10:20AM

## 2015-01-09 ENCOUNTER — Emergency Department (HOSPITAL_COMMUNITY)
Admission: EM | Admit: 2015-01-09 | Discharge: 2015-01-09 | Disposition: A | Discharge status: Home / Self Care | Payer: Medicare Other | Attending: Emergency Medicine | Admitting: Emergency Medicine

## 2015-01-09 ENCOUNTER — Emergency Department (HOSPITAL_COMMUNITY): Payer: Medicare Other

## 2015-01-09 ENCOUNTER — Encounter (HOSPITAL_COMMUNITY): Payer: Self-pay

## 2015-01-09 DIAGNOSIS — I10 Essential (primary) hypertension: Secondary | ICD-10-CM | POA: Diagnosis not present

## 2015-01-09 DIAGNOSIS — B20 Human immunodeficiency virus [HIV] disease: Secondary | ICD-10-CM | POA: Insufficient documentation

## 2015-01-09 DIAGNOSIS — Z7982 Long term (current) use of aspirin: Secondary | ICD-10-CM | POA: Insufficient documentation

## 2015-01-09 DIAGNOSIS — E785 Hyperlipidemia, unspecified: Secondary | ICD-10-CM | POA: Insufficient documentation

## 2015-01-09 DIAGNOSIS — Z720 Tobacco use: Secondary | ICD-10-CM | POA: Insufficient documentation

## 2015-01-09 DIAGNOSIS — J209 Acute bronchitis, unspecified: Secondary | ICD-10-CM | POA: Diagnosis not present

## 2015-01-09 DIAGNOSIS — H409 Unspecified glaucoma: Secondary | ICD-10-CM | POA: Diagnosis not present

## 2015-01-09 DIAGNOSIS — R011 Cardiac murmur, unspecified: Secondary | ICD-10-CM | POA: Insufficient documentation

## 2015-01-09 DIAGNOSIS — G8929 Other chronic pain: Secondary | ICD-10-CM | POA: Insufficient documentation

## 2015-01-09 DIAGNOSIS — Z79899 Other long term (current) drug therapy: Secondary | ICD-10-CM | POA: Insufficient documentation

## 2015-01-09 DIAGNOSIS — J40 Bronchitis, not specified as acute or chronic: Secondary | ICD-10-CM

## 2015-01-09 DIAGNOSIS — Z872 Personal history of diseases of the skin and subcutaneous tissue: Secondary | ICD-10-CM | POA: Diagnosis not present

## 2015-01-09 DIAGNOSIS — Z87828 Personal history of other (healed) physical injury and trauma: Secondary | ICD-10-CM | POA: Diagnosis not present

## 2015-01-09 DIAGNOSIS — R05 Cough: Secondary | ICD-10-CM | POA: Diagnosis present

## 2015-01-09 DIAGNOSIS — Z7952 Long term (current) use of systemic steroids: Secondary | ICD-10-CM | POA: Diagnosis not present

## 2015-01-09 DIAGNOSIS — M810 Age-related osteoporosis without current pathological fracture: Secondary | ICD-10-CM | POA: Insufficient documentation

## 2015-01-09 DIAGNOSIS — K5903 Drug induced constipation: Secondary | ICD-10-CM | POA: Diagnosis not present

## 2015-01-09 DIAGNOSIS — F419 Anxiety disorder, unspecified: Secondary | ICD-10-CM | POA: Diagnosis not present

## 2015-01-09 DIAGNOSIS — H903 Sensorineural hearing loss, bilateral: Secondary | ICD-10-CM | POA: Insufficient documentation

## 2015-01-09 DIAGNOSIS — F17210 Nicotine dependence, cigarettes, uncomplicated: Secondary | ICD-10-CM | POA: Diagnosis not present

## 2015-01-09 LAB — CBC WITH DIFFERENTIAL/PLATELET
BASOS PCT: 1 %
Basophils Absolute: 0 10*3/uL (ref 0.0–0.1)
EOS ABS: 0.1 10*3/uL (ref 0.0–0.7)
EOS PCT: 1 %
HCT: 34.3 % — ABNORMAL LOW (ref 36.0–46.0)
Hemoglobin: 11.9 g/dL — ABNORMAL LOW (ref 12.0–15.0)
LYMPHS ABS: 4.1 10*3/uL — AB (ref 0.7–4.0)
Lymphocytes Relative: 50 %
MCH: 33.1 pg (ref 26.0–34.0)
MCHC: 34.7 g/dL (ref 30.0–36.0)
MCV: 95.5 fL (ref 78.0–100.0)
Monocytes Absolute: 0.3 10*3/uL (ref 0.1–1.0)
Monocytes Relative: 3 %
Neutro Abs: 3.8 10*3/uL (ref 1.7–7.7)
Neutrophils Relative %: 45 %
PLATELETS: 367 10*3/uL (ref 150–400)
RBC: 3.59 MIL/uL — AB (ref 3.87–5.11)
RDW: 14.9 % (ref 11.5–15.5)
WBC: 8.3 10*3/uL (ref 4.0–10.5)

## 2015-01-09 LAB — COMPREHENSIVE METABOLIC PANEL
ALBUMIN: 3.8 g/dL (ref 3.5–5.0)
ALT: 13 U/L — ABNORMAL LOW (ref 14–54)
ANION GAP: 7 (ref 5–15)
AST: 20 U/L (ref 15–41)
Alkaline Phosphatase: 49 U/L (ref 38–126)
BUN: 7 mg/dL (ref 6–20)
CHLORIDE: 119 mmol/L — AB (ref 101–111)
CO2: 16 mmol/L — AB (ref 22–32)
Calcium: 9.2 mg/dL (ref 8.9–10.3)
Creatinine, Ser: 0.64 mg/dL (ref 0.44–1.00)
GFR calc Af Amer: >60 mL/min (ref >60)
GFR calc non Af Amer: >60 mL/min (ref >60)
Glucose, Bld: 105 mg/dL — ABNORMAL HIGH (ref 65–99)
Potassium: 3.1 mmol/L — ABNORMAL LOW (ref 3.5–5.1)
SODIUM: 142 mmol/L (ref 135–145)
Total Bilirubin: 0.3 mg/dL (ref 0.3–1.2)
Total Protein: 7.5 g/dL (ref 6.5–8.1)

## 2015-01-09 MED ORDER — AZITHROMYCIN 250 MG PO TABS: 250.0000 mg | ORAL_TABLET | Freq: Every day | ORAL | Status: DC

## 2015-01-09 NOTE — ED Provider Notes (Signed)
CSN: [id number]     Arrival date & time 01/09/15  9485 History   First MD Initiated Contact with Patient 01/09/15 [phone]     Chief Complaint  Patient presents with  . Cough     (Consider location/radiation/quality/duration/timing/severity/associated sxs/prior Treatment) Patient is a 74 y.o. female presenting with cough. The history is provided by the patient. No language interpreter was used.  Cough Cough characteristics:  Productive Sputum characteristics:  Clear Severity:  Moderate Onset quality:  Gradual Duration:  3 weeks Timing:  Constant Progression:  Worsening Chronicity:  New Smoker: yes   Context: not animal exposure, not sick contacts and not smoke exposure   Relieved by:  Nothing Worsened by:  Nothing tried Ineffective treatments:  None tried Associated symptoms: no chest pain, no fever and no shortness of breath   Pt has had a cough for 3 weeks.   Pt reports she felt bad yesterday, she is feeling better today.  Pt has HIv and is followed by [facility].  Per MD in June, undetectable, with perfect medication compliance.    Past Medical History  Diagnosis Date  . Human immunodeficiency virus disease ([place]) 03/27/1986  . Tobacco abuse 02/19/2012  . Essential hypertension 07/20/2006  . Hyperlipidemia LDL goal < 100 04/05/2012  . 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 Lumbar degenerative disc disease 07/20/2006    With chronic back pain   . 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 Small bowel obstruction due to adhesions ([place]) 02/08/2012    s/p Exploratory laparotomy, lysis of adhesions 02/12/12    . Constipation due to pain medication 04/27/2010  . Diverticulosis 02/08/2012    Extensive left-sided diverticula on colonoscopy March 2012 per Dr. Gala Romney   . Genital herpes 07/20/2006  . 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 Seborrhea 09/01/2010  . Anxiety 04/05/2012  .  Bursitis of right shoulder 07/12/2012    s/p shoulder injection 07/12/2012   . Voiding dysfunction     s/p cystoscopy and meatal dilation Dec 2005  . Cataract of right eye   . Heart murmur 1961  . Right rotator cuff tear 02/01/2013    Responds to periodic steroid injections  . Periumbilical hernia 0/05/5007    1 cm left periumbilical abdominal wall defect  . 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  Hearing aides with possible masking of tinnitus recommended but patient wished to defer secondary to finances.  . Blood transfusion without reported diagnosis     pt denies   Past Surgical History  Procedure Laterality Date  . Eye surgery    . Aorto-femoral bypass graft  04/2007  . Appendectomy    . Cholecystectomy    . Colectomy  01/2011    Dr. Margot Chimes; "took out 12 inches of small intestiines and removed blockage"  . Abdominal hysterectomy    . Laparotomy  02/12/2012    Procedure: EXPLORATORY LAPAROTOMY;  Surgeon: Stark Klein, MD;  Location: [facility] OR;  Service: General;  Laterality: N/A;  Exploratory Laparotomy, lysis of adhesions  . Breast surgery      Breast biopsy: negative  . Small intestine surgery     Family History  Problem Relation Age of Onset  . Kidney failure Mother   .  Diabetes Mother   . Hypertension Mother   . Heart disease Mother   . Glaucoma Father   . Congestive Heart Failure Sister   . Diabetes Sister   . Kidney disease Sister   . Diabetes Brother   . Unexplained death Brother 63    Automobile accident  . Hypothyroidism Daughter   . Arthritis Daughter     Neck/Back  . Healthy Son   . HIV/AIDS Brother   . HIV Daughter   . Kidney disease Daughter   . Arthritis Son     Knee   Social History  Substance Use Topics  . Smoking status: Current Every Day Smoker -- 0.50 packs/day for 50 years    Types: Cigars,  Cigarettes  . Smokeless tobacco: Never Used     Comment: smokes black and mild cigars  . Alcohol Use: No     Comment: "last drink of alcohol ~ 1977"   OB History    Gravida Para Term Preterm AB TAB SAB Ectopic Multiple Living   6 4 4  2  2   4      Review of Systems  Constitutional: Negative for fever.  Respiratory: Positive for cough. Negative for shortness of breath.   Cardiovascular: Negative for chest pain.  All other systems reviewed and are negative.     Allergies  Hctz  Home Medications   Prior to Admission medications   Medication Sig Start Date End Date Taking? Authorizing Provider  acyclovir (ZOVIRAX) 400 MG tablet TAKE 1 TABLET BY MOUTH THREE TIMES DAILY AS NEEDED FOR OUTBREAKS FOR 7 DAYS 11/13/14   Truman Hayward, MD  albuterol (PROVENTIL HFA;VENTOLIN HFA) 108 (90 BASE) MCG/ACT inhaler Inhale 1-2 puffs into the lungs every 6 (six) hours as needed for wheezing. 07/04/14   Tanna Furry, MD  alendronate (FOSAMAX) 70 MG tablet Take 1 tablet (70 mg total) by mouth once a week. Patient taking differently: Take 70 mg by mouth once a week. Takes on Sunday 05/26/14   Oval Linsey, MD  ALPRAZolam Duanne Moron) 1 MG tablet Take 1 tablet (1 mg total) by mouth 3 (three) times daily as needed for anxiety. 12/17/14   Oval Linsey, MD  amLODipine (NORVASC) 10 MG tablet TAKE 1 TABLET BY MOUTH DAILY. 05/22/14   Sid Falcon, MD  aspirin 81 MG EC tablet Take 81 mg by mouth daily.      Historical Provider, MD  atenolol (TENORMIN) 50 MG tablet Take 1 tablet (50 mg total) by mouth daily. 11/21/14   Oval Linsey, MD  benazepril (LOTENSIN) 40 MG tablet TAKE 1 TABLET BY MOUTH EVERY DAY 12/21/14   Truman Hayward, MD  Calcium Carbonate Antacid (ROLAIDS EXTRA STRENGTH PO) Take 1 tablet by mouth as needed (acid reflux).     Historical Provider, MD  calcium-vitamin D (OSCAL WITH D) 500-200 MG-UNIT per tablet Take 1 tablet by mouth 2 (two) times daily.    Historical Provider, MD  COMBIGAN  0.2-0.5 % ophthalmic solution Place 1 drop into the left eye 2 (two) times daily. 01/06/13   Historical Provider, MD  cyclobenzaprine (FLEXERIL) 10 MG tablet Take 1 tablet (10 mg total) by mouth 3 (three) times daily as needed for muscle spasms. 07/21/14   Oval Linsey, MD  docusate sodium (COLACE) 100 MG capsule Take 100 mg by mouth 2 (two) times daily.    Historical Provider, MD  efavirenz-emtricitabine-tenofovir (ATRIPLA) 600-200-300 MG tablet Take 1 tablet by mouth at bedtime. 12/23/14   Truman Hayward,  MD  fentaNYL (DURAGESIC) 50 MCG/HR Place 1 patch (50 mcg total) onto the skin every 3 (three) days. 11/19/14 02/04/15  Oval Linsey, MD  HYDROcodone-acetaminophen (NORCO) 10-325 MG per tablet Take 1 tablet by mouth every 8 (eight) hours as needed for moderate pain. 11/19/14   Oval Linsey, MD  ibuprofen (ADVIL,MOTRIN) 200 MG tablet Take 400 mg by mouth 3 (three) times daily as needed for pain.    Historical Provider, MD  lovastatin (MEVACOR) 40 MG tablet TAKE 1 TABLET BY MOUTH AT BEDTIME 07/21/14   Truman Hayward, MD  magnesium hydroxide (MILK OF MAGNESIA) 800 MG/5ML suspension Take 30 mLs by mouth daily. Patient taking differently: Take 30 mLs by mouth daily as needed for constipation or heartburn.  09/09/13   Jones Bales, MD  megestrol (MEGACE) 40 MG tablet Take 2 tablets (80 mg total) by mouth 2 (two) times daily. 03/23/14   Oval Linsey, MD  Multiple Vitamin (MULTIVITAMIN WITH MINERALS) TABS Take 1 tablet by mouth daily.    Historical Provider, MD  sorbitol 70 % solution Take 15 mLs by mouth daily as needed. 02/20/14   Oval Linsey, MD  spironolactone (ALDACTONE) 50 MG tablet Take 1 tablet (50 mg total) by mouth daily. 04/24/14 04/24/15  Oval Linsey, MD  triamcinolone ointment (KENALOG) 0.1 % APPLY TO AFFECTED AREA TWICE DAILY 09/21/14   Truman Hayward, MD   BP 178/78 mmHg  Pulse 80  Temp(Src) 98.5 F (36.9 C) (Oral)  Resp 2  SpO2 100% Physical Exam   Constitutional: She is oriented to person, place, and time. She appears well-developed and well-nourished.  HENT:  Head: Normocephalic.  Eyes: Conjunctivae and EOM are normal. Pupils are equal, round, and reactive to light.  Neck: Normal range of motion. Neck supple.  Cardiovascular: Normal rate and regular rhythm.   Pulmonary/Chest: Effort normal and breath sounds normal.  cough  Abdominal: Soft. She exhibits no distension.  Musculoskeletal: Normal range of motion.  Neurological: She is alert and oriented to person, place, and time.  Skin: Skin is warm.  Psychiatric: She has a normal mood and affect.  Nursing note and vitals reviewed.   ED Course  Procedures (including critical care time) Labs Review Labs Reviewed  CBC WITH DIFFERENTIAL/PLATELET - Abnormal; Notable for the following:    RBC 3.59 (*)    Hemoglobin 11.9 (*)    HCT 34.3 (*)    Lymphs Abs 4.1 (*)    All other components within normal limits  COMPREHENSIVE METABOLIC PANEL - Abnormal; Notable for the following:    Potassium 3.1 (*)    Chloride 119 (*)    CO2 16 (*)    Glucose, Bld 105 (*)    ALT 13 (*)    All other components within normal limits    Imaging Review Dg Chest 2 View  01/09/2015  CLINICAL DATA:  Cough for 3 weeks, chest pain, back pain. EXAM: CHEST  2 VIEW COMPARISON:  09/03/2014 FINDINGS: Cardiomediastinal silhouette is normal. Mediastinal contours appear intact. Tortuosity and atherosclerotic calcifications of the aorta noted. There is no evidence of focal airspace consolidation, pleural effusion or pneumothorax. Osseous structures are without acute abnormality. Thoracic spine kyphosis with mild anterior compression deformity of mid thoracic vertebral bodies is stable. Soft tissues are grossly normal. IMPRESSION: No active cardiopulmonary disease. Electronically Signed   By: Fidela Salisbury M.D.   On: 01/09/2015 10:16   I have personally reviewed and evaluated these images and lab results as  part of my  medical decision-making.   EKG Interpretation None       no pneumonia,  No elevation in wbc count   Final diagnoses:  Bronchitis    Pt counseled on results.  I will treat with zithromax.   Pt advised to see her Md for recheck.    [place], PA-C 01/09/15 Kingman Liu, MD 01/10/15 [phone]

## 2015-01-09 NOTE — ED Notes (Addendum)
Pt reports cough, productive at times, headache, and chest pain with coughing x 3 weeks.  Denies fever.

## 2015-01-09 NOTE — Discharge Instructions (Signed)

## 2015-01-12 ENCOUNTER — Other Ambulatory Visit: Payer: Self-pay | Admitting: Infectious Disease

## 2015-01-17 ENCOUNTER — Other Ambulatory Visit: Payer: Self-pay | Admitting: Infectious Disease

## 2015-02-15 ENCOUNTER — Other Ambulatory Visit: Payer: Self-pay | Admitting: *Deleted

## 2015-02-15 ENCOUNTER — Ambulatory Visit (INDEPENDENT_AMBULATORY_CARE_PROVIDER_SITE_OTHER): Payer: Medicare Other | Admitting: Infectious Disease

## 2015-02-15 ENCOUNTER — Telehealth: Payer: Self-pay | Admitting: Internal Medicine

## 2015-02-15 ENCOUNTER — Encounter: Payer: Self-pay | Admitting: Infectious Disease

## 2015-02-15 VITALS — BP 126/76 | HR 68 | Temp 98.2°F | Wt 140.0 lb

## 2015-02-15 DIAGNOSIS — B20 Human immunodeficiency virus [HIV] disease: Secondary | ICD-10-CM | POA: Diagnosis not present

## 2015-02-15 DIAGNOSIS — I10 Essential (primary) hypertension: Secondary | ICD-10-CM

## 2015-02-15 DIAGNOSIS — M810 Age-related osteoporosis without current pathological fracture: Secondary | ICD-10-CM

## 2015-02-15 DIAGNOSIS — K5903 Drug induced constipation: Secondary | ICD-10-CM

## 2015-02-15 DIAGNOSIS — R55 Syncope and collapse: Secondary | ICD-10-CM | POA: Diagnosis not present

## 2015-02-15 DIAGNOSIS — I739 Peripheral vascular disease, unspecified: Secondary | ICD-10-CM | POA: Diagnosis not present

## 2015-02-15 DIAGNOSIS — E785 Hyperlipidemia, unspecified: Secondary | ICD-10-CM | POA: Diagnosis not present

## 2015-02-15 DIAGNOSIS — K219 Gastro-esophageal reflux disease without esophagitis: Secondary | ICD-10-CM | POA: Diagnosis not present

## 2015-02-15 DIAGNOSIS — M5136 Other intervertebral disc degeneration, lumbar region: Secondary | ICD-10-CM

## 2015-02-15 HISTORY — DX: Syncope and collapse: R55

## 2015-02-15 MED ORDER — ATORVASTATIN CALCIUM 10 MG PO TABS: 10.0000 mg | ORAL_TABLET | Freq: Every day | ORAL | Status: DC

## 2015-02-15 MED ORDER — EMTRICITABINE-TENOFOVIR AF 200-25 MG PO TABS: 1.0000 | ORAL_TABLET | Freq: Every day | ORAL | Status: DC

## 2015-02-15 MED ORDER — DOLUTEGRAVIR SODIUM 50 MG PO TABS: 50.0000 mg | ORAL_TABLET | Freq: Every day | ORAL | Status: DC

## 2015-02-15 MED ORDER — PANTOPRAZOLE SODIUM 40 MG PO TBEC: 40.0000 mg | DELAYED_RELEASE_TABLET | Freq: Every day | ORAL | Status: DC

## 2015-02-15 NOTE — Progress Notes (Signed)
Chief complaint: several falls also followup for HIV Subjective:    Patient ID: Jessica [name], female    DOB: 1940-11-12, 74 y.o.   MRN: [id number]  HPI   Jessica [name] is a 74 y.o. female with HIV infection who is doing superbly well on her antiviral regimen, Atripla with undetectable viral load and health cd4 count.   Lab Results  Component Value Date   HIV1RNAQUANT <20 12/23/2014   Lab Results  Component Value Date   CD4TABS 1990 12/23/2014   CD4TABS 1830 08/19/2014   CD4TABS 2350 02/16/2014     She does have PVD and is seen by Cardiology, [facility] having undergon bypass surgery and seen by Dr Eppie Gibson in [facility] clinic   Apparently Jessica [name] has had 3 falls recently one was a mechanical fall as she was stepping up on a ladder and fell through one of the steps and cut her shin. The other 2 falls were syncopal events that happened while she was on the toilet straining to have a bowel movement and sound to be vasovagal mediated syncopal episodes.  We did check orthostatic vital signs today her blood pressure systolic did drop by 20 points but she was not symptomatic with this drop and it was to the 120s from the 140s.  Past Medical History  Diagnosis Date  . Human immunodeficiency virus disease ([place]) 03/27/1986  . Tobacco abuse 02/19/2012  . Essential hypertension 07/20/2006  . Hyperlipidemia LDL goal < 100 04/05/2012  . 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[place]) 11/01/2011    s/p aortobifem bypass 2009   . Lumbar degenerative disc disease 07/20/2006    With chronic back pain   . 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 Small bowel obstruction due to adhesions ([place]) 02/08/2012    s/p Exploratory laparotomy, lysis of adhesions 02/12/12    . Constipation due to pain medication 04/27/2010  . Diverticulosis 02/08/2012    Extensive left-sided diverticula on colonoscopy March 2012 per Dr. Gala Romney   . Genital herpes 07/20/2006  .  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 Seborrhea 09/01/2010  . Anxiety 04/05/2012  . Bursitis of right shoulder 07/12/2012    s/p shoulder injection 07/12/2012   . Voiding dysfunction     s/p cystoscopy and meatal dilation Dec 2005  . Cataract of right eye   . Heart murmur 1961  . Right rotator cuff tear 02/01/2013    Responds to periodic steroid injections  . Periumbilical hernia 123XX123    1 cm left periumbilical abdominal wall defect  . 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  Hearing aides with possible masking of tinnitus recommended but patient wished to defer secondary to finances.  . Blood transfusion without reported diagnosis     pt denies    Past Surgical History  Procedure Laterality Date  . Eye surgery    . Aorto-femoral bypass graft  04/2007  . Appendectomy    . Cholecystectomy    . Colectomy  01/2011    Dr. Margot Chimes; "took out 12 inches of small intestiines and removed blockage"  . Abdominal hysterectomy    . Laparotomy  02/12/2012    Procedure: EXPLORATORY LAPAROTOMY;  Surgeon: Stark Klein, MD;  Location: [facility];  Service: General;  Laterality: N/A;  Exploratory Laparotomy,  lysis of adhesions  . Breast surgery      Breast biopsy: negative  . Small intestine surgery      Family History  Problem Relation Age of Onset  . Kidney failure Mother   . Diabetes Mother   . Hypertension Mother   . Heart disease Mother   . Glaucoma Father   . Congestive Heart Failure Sister   . Diabetes Sister   . Kidney disease Sister   . Diabetes Brother   . Unexplained death Brother 58    Automobile accident  . Hypothyroidism Daughter   . Arthritis Daughter     Neck/Back  . Healthy Son   . HIV/AIDS Brother   . HIV Daughter   . Kidney disease Daughter   . Arthritis Son     Knee      Social History   Social History  . Marital Status:  Widowed    Spouse Name: N/A  . Number of Children: 4  . Years of Education: 2y college   Occupational History  . retired     previously worked as a Designer, fashion/clothing for [employer] History Main Topics  . Smoking status: Current Every Day Smoker -- 0.50 packs/day for 50 years    Types: Cigarettes  . Smokeless tobacco: Never Used  . Alcohol Use: No     Comment: "last drink of alcohol ~ 1977"  . Drug Use: No     Comment: cutting back on chantix, 1 cigarette this morning  . Sexual Activity: No   Other Topics Concern  . None   Social History Narrative   Lives alone in [place], [place]    Allergies  Allergen Reactions  . Hctz [Hydrochlorothiazide] Other (See Comments)    Dizziness, syncope. Does not wish to take anymore     Current outpatient prescriptions:  .  acyclovir (ZOVIRAX) 400 MG tablet, TAKE 1 TABLET BY MOUTH THREE TIMES DAILY AS NEEDED FOR OUTBREAKS FOR 7 DAYS, Disp: 21 tablet, Rfl: 2 .  alendronate (FOSAMAX) 70 MG tablet, Take 1 tablet (70 mg total) by mouth once a week. (Patient taking differently: Take 70 mg by mouth once a week. Takes on Sunday), Disp: 12 tablet, Rfl: 3 .  ALPRAZolam (XANAX) 1 MG tablet, Take 1 tablet (1 mg total) by mouth 3 (three) times daily as needed for anxiety., Disp: 90 tablet, Rfl: 5 .  amLODipine (NORVASC) 10 MG tablet, TAKE 1 TABLET BY MOUTH DAILY., Disp: 90 tablet, Rfl: 3 .  aspirin 81 MG EC tablet, Take 81 mg by mouth daily.  , Disp: , Rfl:  .  atenolol (TENORMIN) 50 MG tablet, Take 1 tablet (50 mg total) by mouth daily., Disp: 90 tablet, Rfl: 3 .  benazepril (LOTENSIN) 40 MG tablet, TAKE 1 TABLET BY MOUTH EVERY DAY, Disp: 30 tablet, Rfl: 11 .  calcium-vitamin D (OSCAL WITH D) 500-200 MG-UNIT per tablet, Take 1 tablet by mouth 2 (two) times daily., Disp: , Rfl:  .  COMBIGAN 0.2-0.5 % ophthalmic solution, Place 1 drop into the left eye 2 (two) times daily., Disp: , Rfl:  .  cyclobenzaprine (FLEXERIL) 10 MG tablet, Take 1 tablet (10 mg total) by  mouth 3 (three) times daily as needed for muscle spasms., Disp: 90 tablet, Rfl: 11 .  docusate sodium (COLACE) 100 MG capsule, Take 100 mg by mouth 2 (two) times daily., Disp: , Rfl:  .  HYDROcodone-acetaminophen (NORCO) 10-325 MG per tablet, Take 1 tablet by mouth every 8 (eight) hours  as needed for moderate pain., Disp: 90 tablet, Rfl: 0 .  ibuprofen (ADVIL,MOTRIN) 200 MG tablet, Take 400 mg by mouth 3 (three) times daily as needed for pain., Disp: , Rfl:  .  magnesium hydroxide (MILK OF MAGNESIA) 800 MG/5ML suspension, Take 30 mLs by mouth daily. (Patient taking differently: Take 30 mLs by mouth daily as needed for constipation or heartburn. ), Disp: 240 mL, Rfl: 0 .  megestrol (MEGACE) 40 MG tablet, Take 2 tablets (80 mg total) by mouth 2 (two) times daily., Disp: 360 tablet, Rfl: 3 .  Multiple Vitamin (MULTIVITAMIN WITH MINERALS) TABS, Take 1 tablet by mouth daily., Disp: , Rfl:  .  sorbitol 70 % solution, Take 15 mLs by mouth daily as needed., Disp: 473 mL, Rfl: 0 .  spironolactone (ALDACTONE) 50 MG tablet, Take 1 tablet (50 mg total) by mouth daily., Disp: 90 tablet, Rfl: 3 .  triamcinolone ointment (KENALOG) 0.1 %, APPLY TO AFFECTED AREA TWICE DAILY, Disp: 454 g, Rfl: 3 .  atorvastatin (LIPITOR) 10 MG tablet, Take 1 tablet (10 mg total) by mouth daily., Disp: 30 tablet, Rfl: 11 .  dolutegravir (TIVICAY) 50 MG tablet, Take 1 tablet (50 mg total) by mouth daily., Disp: 30 tablet, Rfl: 11 .  emtricitabine-tenofovir AF (DESCOVY) 200-25 MG tablet, Take 1 tablet by mouth daily., Disp: 30 tablet, Rfl: 11 .  fentaNYL (DURAGESIC) 50 MCG/HR, Place 1 patch (50 mcg total) onto the skin every 3 (three) days., Disp: 10 patch, Rfl: 0 .  pantoprazole (PROTONIX) 40 MG tablet, Take 1 tablet (40 mg total) by mouth daily., Disp: 30 tablet, Rfl: 11        Review of Systems  Constitutional: Negative for chills, diaphoresis, activity change, appetite change, fatigue and unexpected weight change.  HENT:  Negative for congestion, ear pain, rhinorrhea, sinus pressure, sneezing, sore throat and trouble swallowing.   Eyes: Negative for photophobia and visual disturbance.  Respiratory: Negative for cough, chest tightness, shortness of breath, wheezing and stridor.   Cardiovascular: Negative for palpitations and leg swelling.  Gastrointestinal: Negative for nausea, vomiting, diarrhea, constipation, blood in stool, abdominal distention and anal bleeding.  Genitourinary: Negative for hematuria, flank pain and difficulty urinating.  Musculoskeletal: Negative for myalgias, joint swelling, arthralgias and gait problem.  Skin: Negative for color change, pallor and wound.  Neurological: Negative for dizziness, tremors and light-headedness.  Hematological: Negative for adenopathy. Does not bruise/bleed easily.  Psychiatric/Behavioral: Negative for behavioral problems, confusion, sleep disturbance, dysphoric mood, decreased concentration and agitation.        Objective:   Physical Exam  Constitutional: She is oriented to person, place, and time. She appears well-developed and well-nourished. No distress.  HENT:  Head: Normocephalic and atraumatic.  Mouth/Throat: No oropharyngeal exudate.  Eyes: Conjunctivae and EOM are normal. No scleral icterus.  Neck: Normal range of motion. Neck supple.  Cardiovascular: Normal rate and regular rhythm.   Pulmonary/Chest: Effort normal. No respiratory distress. She has no wheezes.  Abdominal: She exhibits no distension.  Musculoskeletal: She exhibits no edema or tenderness.  Neurological: She is alert and oriented to person, place, and time. She exhibits normal muscle tone. Coordination normal.  Skin: Skin is warm and dry. No rash noted. She is not diaphoretic. No erythema. No pallor.  Psychiatric: She has a normal mood and affect. Her behavior is normal. Judgment and thought content normal.          Assessment & Plan:   HIV: change to Tivicay and Descovy and  recheck labs in  one month   HTN being managed by Dr. Eppie Gibson.  Falls and 2 syncopal events: She needs to exercise extreme caution when having BM since this appears to be vagal + syncope due to straining.  Grief with loss of her daughter (who was also my patient and who was found deceased at home by Judson Roch). Daughter had IVDU problem was on methadone and then cut off from the drug. Suspicion was that it was a drug-related overdose causing her death.  I've counseled her today and offered her counseling with Jody but she refused next, bring Jody long just introduce her to the patient.  Gastroesophageal reflux disease we will change her to a proton pump inhibitor given that some of her meds that she is take this might interfere with her new regimen DTG component  Hyperlipidemia: Will change to Lipitor just to avoid potential drug drug reaction current cholesterol lowering statin.  All her medications are extensive and reviewed by my pharmacist Onnie Boer who made recommendations with re to the GERD and hyperlipidemia   I spent greater than 40 minutes with the patient including greater than 50% of time in face to face counsel of the patient and her HIV regimen her syncopal episodes her grief loss of her daughter her hyperlipidemia hypertension, falls and in coordination of  her care.

## 2015-02-15 NOTE — Progress Notes (Signed)
Patient ID: Jessica [name], female   DOB: [birth year], 74 y.o.   MRN: [id number] HPI: Jessica [name] is a 74 y.o. female who is here for her HIV f/u.   Allergies: Allergies  Allergen Reactions  . Hctz [Hydrochlorothiazide] Other (See Comments)    Dizziness, syncope. Does not wish to take anymore    Vitals: Temp: 98.2 F (36.8 C) (12/05 0940) Temp Source: Oral (12/05 0940) BP: 126/76 mmHg (12/05 1032) Pulse Rate: 68 (12/05 1032)  Past Medical History: Past Medical History  Diagnosis Date  . Human immunodeficiency virus disease ([place]) 03/27/1986  . Tobacco abuse 02/19/2012  . Essential hypertension 07/20/2006  . Hyperlipidemia LDL goal < 100 04/05/2012  . 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[place]) 11/01/2011    s/p aortobifem bypass 2009   . Lumbar degenerative disc disease 07/20/2006    With chronic back pain   . 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 Small bowel obstruction due to adhesions ([place]) 02/08/2012    s/p Exploratory laparotomy, lysis of adhesions 02/12/12    . Constipation due to pain medication 04/27/2010  . Diverticulosis 02/08/2012    Extensive left-sided diverticula on colonoscopy March 2012 per Dr. Gala Romney   . Genital herpes 07/20/2006  . 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 Seborrhea 09/01/2010  . Anxiety 04/05/2012  . Bursitis of right shoulder 07/12/2012    s/p shoulder injection 07/12/2012   . Voiding dysfunction     s/p cystoscopy and meatal dilation Dec 2005  . Cataract of right eye   . Heart murmur 1961  . Right rotator cuff tear 02/01/2013    Responds to periodic steroid injections  . Periumbilical hernia 123XX123    1 cm left periumbilical abdominal wall defect  . 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  Hearing aides with possible masking of  tinnitus recommended but patient wished to defer secondary to finances.  . Blood transfusion without reported diagnosis     pt denies    Social History: Social History   Social History  . Marital Status: Widowed    Spouse Name: N/A  . Number of Children: 4  . Years of Education: 2y college   Occupational History  . 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 Smoking status: Current Every Day Smoker -- 0.50 packs/day for 50 years    Types: Cigarettes  . Smokeless tobacco: Never Used  . Alcohol Use: No     Comment: "last drink of alcohol ~ 1977"  . Drug Use: No     Comment: cutting back on chantix, 1 cigarette this morning  . Sexual Activity: No   Other Topics Concern  . None   Social History Narrative   Lives alone in [place], [place]    Previous Regimen:   Current Regimen: Atripla  Labs: HIV 1 RNA QUANT (copies/mL)  Date Value  12/23/2014 <20  08/19/2014 <20  02/16/2014 <20   HIV-1 RNA VIRAL LOAD (no units)  Date Value  06/15/2011 <40  10/27/2010 <40  06/29/2010 <40   CD4 (no units)  Date Value  06/15/2011 1210  10/27/2010 1167  06/29/2010 1859   CD4 T CELL ABS (/uL)  Date Value  12/23/2014 1990  08/19/2014 1830  02/16/2014 2350   HEP B S AB (no units)  Date Value  05/07/2006 No   HEPATITIS B SURFACE AG (no units)  Date Value  08/11/2008 NEG   HCV AB (no units)  Date Value  06/29/2010 NEGATIVE    CrCl: CrCl cannot be calculated (Patient has no serum creatinine result on file.).  Lipids:    Component Value Date/Time   CHOL 144 12/23/2014 1005   TRIG 67 12/23/2014 1005   HDL 65 12/23/2014 1005   CHOLHDL 2.2 12/23/2014 1005   VLDL 13 12/23/2014 1005   LDLCALC 66 12/23/2014 1005    Assessment: 74 yo who is very well controlled on Atripla. We are changing her ART to DTG+Descovy for the safety profile. Additionally, she is also on lovastatin which is no longer prefer due to drug  interaction profile. We are going to change it to atorvastatin instead. She has been taking rolaids for her reflux symptoms. The daughter keeps asking about Nexium for her. We are going to add protonix and this would not interact with her other meds. They are very excited about the change. Made her a calendar for her new HIV meds.   Recommendations: Stop Atripla Start Descovy 1 PO qday Start Tivicay 50mg  PO qday Change Lovastatin to atorvastatin 10mg  PO qday Stop Rolaids and add Protonix 40mg  PO qday Rx sent to [facility], Annia Belt, PharmD [facility] 02/15/2015, 10:59 AM

## 2015-02-16 MED ORDER — HYDROCODONE-ACETAMINOPHEN 10-325 MG PO TABS: 1.0000 | ORAL_TABLET | Freq: Three times a day (TID) | ORAL | Status: DC | PRN

## 2015-02-19 ENCOUNTER — Ambulatory Visit: Payer: Medicare Other | Admitting: Internal Medicine

## 2015-02-22 ENCOUNTER — Ambulatory Visit: Payer: Medicare Other | Admitting: Internal Medicine

## 2015-02-22 ENCOUNTER — Other Ambulatory Visit: Payer: Self-pay | Admitting: *Deleted

## 2015-02-22 ENCOUNTER — Telehealth: Payer: Self-pay | Admitting: *Deleted

## 2015-02-22 DIAGNOSIS — M5136 Other intervertebral disc degeneration, lumbar region: Secondary | ICD-10-CM

## 2015-02-22 MED ORDER — FENTANYL 50 MCG/HR TD PT72: 50.0000 ug | MEDICATED_PATCH | TRANSDERMAL | Status: DC

## 2015-02-22 NOTE — Telephone Encounter (Signed)
Walk-in to clinic - has not received new HIV rxes.  RN called Development worker, community in [place].  [facility] to expedite delivery to the patient.  Pt informed that [facility] will be calling her about receiving her rx.

## 2015-02-22 NOTE — Telephone Encounter (Signed)
Pt unable to see her pcp today (office cancelled appt).  Pt requesting refill on her Duragesic patches.  Will send to attending pool for completion as pt needs rx today.Jessica [name], [name]12/12/20168:54 AM

## 2015-02-22 NOTE — Telephone Encounter (Signed)
Very good 

## 2015-02-24 ENCOUNTER — Other Ambulatory Visit: Payer: Self-pay | Admitting: Internal Medicine

## 2015-02-24 DIAGNOSIS — I10 Essential (primary) hypertension: Secondary | ICD-10-CM

## 2015-03-16 ENCOUNTER — Other Ambulatory Visit: Payer: Medicare Other

## 2015-03-16 DIAGNOSIS — B20 Human immunodeficiency virus [HIV] disease: Secondary | ICD-10-CM

## 2015-03-16 LAB — CBC WITH DIFFERENTIAL/PLATELET
BASOS PCT: 1 % (ref 0–1)
Basophils Absolute: 0.1 10*3/uL (ref 0.0–0.1)
Eosinophils Absolute: 0.2 10*3/uL (ref 0.0–0.7)
Eosinophils Relative: 2 % (ref 0–5)
HEMATOCRIT: 39 % (ref 36.0–46.0)
HEMOGLOBIN: 13 g/dL (ref 12.0–15.0)
LYMPHS PCT: 66 % — AB (ref 12–46)
Lymphs Abs: 5.6 10*3/uL — ABNORMAL HIGH (ref 0.7–4.0)
MCH: 32.3 pg (ref 26.0–34.0)
MCHC: 33.3 g/dL (ref 30.0–36.0)
MCV: 97 fL (ref 78.0–100.0)
MONO ABS: 0.4 10*3/uL (ref 0.1–1.0)
MONOS PCT: 5 % (ref 3–12)
MPV: 9 fL (ref 8.6–12.4)
NEUTROS ABS: 2.2 10*3/uL (ref 1.7–7.7)
NEUTROS PCT: 26 % — AB (ref 43–77)
Platelets: 338 10*3/uL (ref 150–400)
RBC: 4.02 MIL/uL (ref 3.87–5.11)
RDW: 13.1 % (ref 11.5–15.5)
WBC: 8.5 10*3/uL (ref 4.0–10.5)

## 2015-03-16 LAB — COMPLETE METABOLIC PANEL WITH GFR
ALBUMIN: 4.3 g/dL (ref 3.6–5.1)
ALT: 9 U/L (ref 6–29)
AST: 15 U/L (ref 10–35)
Alkaline Phosphatase: 43 U/L (ref 33–130)
BILIRUBIN TOTAL: 0.3 mg/dL (ref 0.2–1.2)
BUN: 14 mg/dL (ref 7–25)
CALCIUM: 9.9 mg/dL (ref 8.6–10.4)
CO2: 20 mmol/L (ref 20–31)
CREATININE: 0.97 mg/dL — AB (ref 0.60–0.93)
Chloride: 110 mmol/L (ref 98–110)
GFR, Est African American: 67 mL/min (ref >=60)
GFR, Est Non African American: 59 mL/min — ABNORMAL LOW (ref >=60)
GLUCOSE: 85 mg/dL (ref 65–99)
Potassium: 4.1 mmol/L (ref 3.5–5.3)
SODIUM: 141 mmol/L (ref 135–146)
TOTAL PROTEIN: 7.3 g/dL (ref 6.1–8.1)

## 2015-03-17 DIAGNOSIS — H2513 Age-related nuclear cataract, bilateral: Secondary | ICD-10-CM | POA: Diagnosis not present

## 2015-03-17 LAB — T-HELPER CELL (CD4) - (RCID CLINIC ONLY)
CD4 T CELL ABS: 2300 /uL (ref 400–2700)
CD4 T CELL HELPER: 42 % (ref 33–55)

## 2015-03-17 LAB — HIV-1 RNA QUANT-NO REFLEX-BLD: HIV-1 RNA Quant, Log: <1.3 Log copies/mL (ref <1.30)

## 2015-03-18 LAB — RPR

## 2015-03-26 ENCOUNTER — Ambulatory Visit (INDEPENDENT_AMBULATORY_CARE_PROVIDER_SITE_OTHER): Payer: Medicare Other | Admitting: Internal Medicine

## 2015-03-26 ENCOUNTER — Telehealth: Payer: Self-pay | Admitting: Internal Medicine

## 2015-03-26 ENCOUNTER — Encounter: Payer: Self-pay | Admitting: Internal Medicine

## 2015-03-26 VITALS — BP 156/61 | HR 62 | Wt 143.0 lb

## 2015-03-26 DIAGNOSIS — T402X5D Adverse effect of other opioids, subsequent encounter: Secondary | ICD-10-CM | POA: Diagnosis not present

## 2015-03-26 DIAGNOSIS — I10 Essential (primary) hypertension: Secondary | ICD-10-CM | POA: Diagnosis not present

## 2015-03-26 DIAGNOSIS — B20 Human immunodeficiency virus [HIV] disease: Secondary | ICD-10-CM

## 2015-03-26 DIAGNOSIS — F17200 Nicotine dependence, unspecified, uncomplicated: Secondary | ICD-10-CM

## 2015-03-26 DIAGNOSIS — M25511 Pain in right shoulder: Secondary | ICD-10-CM

## 2015-03-26 DIAGNOSIS — E785 Hyperlipidemia, unspecified: Secondary | ICD-10-CM | POA: Diagnosis not present

## 2015-03-26 DIAGNOSIS — A6000 Herpesviral infection of urogenital system, unspecified: Secondary | ICD-10-CM | POA: Diagnosis not present

## 2015-03-26 DIAGNOSIS — M51369 Other intervertebral disc degeneration, lumbar region without mention of lumbar back pain or lower extremity pain: Secondary | ICD-10-CM

## 2015-03-26 DIAGNOSIS — Z0000 Encounter for general adult medical examination without abnormal findings: Secondary | ICD-10-CM

## 2015-03-26 DIAGNOSIS — K5903 Drug induced constipation: Secondary | ICD-10-CM

## 2015-03-26 DIAGNOSIS — M5136 Other intervertebral disc degeneration, lumbar region: Secondary | ICD-10-CM

## 2015-03-26 DIAGNOSIS — M75101 Unspecified rotator cuff tear or rupture of right shoulder, not specified as traumatic: Secondary | ICD-10-CM

## 2015-03-26 DIAGNOSIS — Z21 Asymptomatic human immunodeficiency virus [HIV] infection status: Secondary | ICD-10-CM | POA: Diagnosis not present

## 2015-03-26 DIAGNOSIS — Z720 Tobacco use: Secondary | ICD-10-CM

## 2015-03-26 MED ORDER — HYDROCODONE-ACETAMINOPHEN 10-325 MG PO TABS: 1.0000 | ORAL_TABLET | Freq: Three times a day (TID) | ORAL | Status: DC | PRN

## 2015-03-26 MED ORDER — FENTANYL 50 MCG/HR TD PT72: 50.0000 ug | MEDICATED_PATCH | TRANSDERMAL | Status: DC

## 2015-03-26 NOTE — Assessment & Plan Note (Signed)
Assessment  Her constipation due to her narcotic pain medication is reasonably well controlled with as needed milk of magnesia, Colace, and sorbitol 70%.  Plan  We will continue the above regimen on an as-needed basis. Depending on how successful we are with weaning down the narcotics her constipation may improve.

## 2015-03-26 NOTE — Assessment & Plan Note (Signed)
Assessment  She continues to have chronic pain related to her lumbar degenerative disc disease. She is quite functional with the fentanyl 50 g per hour via patch changed every 3 days and hydrocodone-acetaminophen 10-325 mg 1 tablet by mouth every 8 hours as needed for pain, dispense #90 per month.  That being said, she is also on alprazolam. The morphine equivalent dose is 150 mg a day which is exceptionally high. This is even more of a concern in the setting of alprazolam. I am concerned that her falls may be more related to the higher doses of the narcotics in combination with the alprazolam then any other reason. Therefore it will be important to wean this medication as far as tolerated to more safer doses. We had a very long discussion about the risks of such high doses of narcotic in the setting of concomitant benzodiazepine use and the need to slowly wean this medication without exacerbating her pain or precipitating narcotic withdrawal. I proposed lowering the fentanyl and increasing the hydrocodone with an ultimate slight decrease in the morphine equivalent dose. The patient and her daughter felt the fentanyl was much more effective than the hydrocodone and would prefer that the hydrocodone dose be weaned down. Given their input and desire that was the strategy we will take.  Plan  We will continue the fentanyl 50 mcg/h via patch applied every 3 days and also continue the hydrocodone-acetaminophen 10-325 mg 1 tablet every 8 hours as needed for pain but decrease the number provided each month from 90 tablets to 85 tablets. As she has a current prescription for #90 tablets in a month we will begin this slow wean with next month's prescription. We will continue the 85 tablets per month until she is reassessed in clinic.  Because of the high morphine equivalent dose we will attempt to provide her and her family with Narcan should she accidentally overdose. The pharmacist was not available in clinic  today. As she has been working on this issue with [name] in clinic I will discuss this issue with her upon her return on January 17. It so happens that the patient will also return on January 17 for a shoulder injection and we can further discuss the issue of the naloxone at that time.  In December she was given 3 prescriptions for her hydrocodone-acetaminophen to cover her for the next 3 months. She states that she gave these prescriptions to the pharmacist. When she contacted the pharmacist today they said they did not have the prescriptions. Review of our records indicate that she had signed for the 3 hydrocodone prescriptions and therefore had them in her possession at one point. At this point, we are unclear if the prescriptions were misplaced or lost by [name] or by the pharmacist. Nonetheless, the prescription was replaced and included the fentanyl for the month. She was informed she would no longer be permitted to pick up more than one month's worth of narcotic prescriptions given this incident.

## 2015-03-26 NOTE — Assessment & Plan Note (Signed)
Assessment  Her blood pressure was elevated today at 156/61. This is while on amlodipine 10 mg by mouth daily, atenolol 50 mg by mouth daily, benazepril 40 mg by mouth daily, and spironolactone 50 mg by mouth daily.  Plan  This is an isolated elevation compared to her previous visit where the blood pressure is 126/76 on this regimen. We will continue the 4 drug regimen as noted above and reassess blood pressure control at follow-up given the most recent reading prior to today that was well within target.

## 2015-03-26 NOTE — Assessment & Plan Note (Signed)
Assessment  Continued tobacco abuse with no interest in quitting at this time.  Plan  We discussed the risks to her health especially given the fact she required an aortobifem bypass in 2009. I asked her to contact me should she change her mind about wanting to quit smoking as I could provide her with some pharmacologic assistance. We will reassess her readiness for smoking cessation at the follow-up visit.

## 2015-03-26 NOTE — Assessment & Plan Note (Signed)
Assessment  Her LDL was 66 within the last 4 months and is within target on atorvastatin 10 mg by mouth daily. She is without associated myalgias.  Plan  We will continue the atorvastatin 10 mg by mouth daily. We will reassess for associated intolerances to the atorvastatin at the follow-up visit.

## 2015-03-26 NOTE — Patient Instructions (Signed)
See you again.  I am glad I got to meet your daughter.  1) Decrease hydrocodone to 85 tablets a month, probably in March.  That means that for 5 days of the month, if you can only take 2 tablets that would be good.  We will slowly decrease this over the next few years to decrease your risks of overdose and death.  2) I will get information on naloxone for your daughter to have on hand.  We will discuss what I find next week.  3) I gave you to additional months of prescriptions for fentanyl and hydrocodone.  4) Keep taking other medications as you are.  5) Schedule an appointment for next week when I am down in clinic and we will give the right shoulder an injection.

## 2015-03-26 NOTE — Assessment & Plan Note (Signed)
She is currently up-to-date on her healthcare maintenance. 

## 2015-03-26 NOTE — Assessment & Plan Note (Signed)
Assessment  Her HIV disease is well controlled. She has recently been converted to [place] and Descovy by her ID specialist Dr. Wendie Agreste who follows her closely and manages her HIV Disease.  She does not have any apparent side effects from the new medications at this time.  Plan  We will continue her anti-retroviral therapy as dictated by Dr. Wendie Agreste of the [facility] for Infectious Diseases.

## 2015-03-26 NOTE — Progress Notes (Signed)
   Subjective:    Patient ID: Jessica [name], female    DOB: [birth year], 75 y.o.   MRN: [id number]  HPI  Jessica [name] is here for follow-up of her HIV disease, chronic pain, generalized anxiety disorder, and smoking . Please see the A&P for the status of the pt's chronic medical problems.  Review of Systems  Constitutional: Negative for activity change, appetite change and unexpected weight change.  Cardiovascular: Negative for leg swelling.  Gastrointestinal: Positive for constipation. Negative for diarrhea.  Musculoskeletal: Positive for myalgias, back pain, arthralgias and gait problem. Negative for joint swelling.  Neurological: Negative for syncope and weakness.  Psychiatric/Behavioral: The patient is nervous/anxious.       Objective:   Physical Exam  Constitutional: She is oriented to person, place, and time. She appears well-developed and well-nourished. No distress.  HENT:  Head: Normocephalic and atraumatic.  Eyes: Conjunctivae are normal. Right eye exhibits no discharge. Left eye exhibits no discharge. No scleral icterus.  Cardiovascular: Normal rate, regular rhythm and normal heart sounds.  Exam reveals no gallop and no friction rub.   No murmur heard. Pulmonary/Chest: Effort normal and breath sounds normal. No respiratory distress. She has no wheezes. She has no rales.  Musculoskeletal: Normal range of motion. She exhibits no edema or tenderness.  Neurological: She is alert and oriented to person, place, and time. She exhibits normal muscle tone.  Skin: Skin is warm and dry. No rash noted. She is not diaphoretic. No erythema.  Psychiatric: She has a normal mood and affect. Her behavior is normal. Judgment and thought content normal.  Nursing note and vitals reviewed.     Assessment & Plan:   Please see problem oriented charting.

## 2015-03-26 NOTE — Assessment & Plan Note (Signed)
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  Plan  We will continue the as needed acyclovir for flares.

## 2015-03-26 NOTE — Assessment & Plan Note (Signed)
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 This has responded well in the past to steroid injections. She is a candidate for a repeat steroid injection but this could not be provided today given the time limitations of the clinic visit.  Plan  The patient was rescheduled for the clinic on Tuesday, January 17, at 2:15 PM for a right shoulder injection. We will reassess her response to this therapy once provided next Tuesday.

## 2015-03-29 ENCOUNTER — Ambulatory Visit (INDEPENDENT_AMBULATORY_CARE_PROVIDER_SITE_OTHER): Payer: Medicare Other | Admitting: Infectious Disease

## 2015-03-29 ENCOUNTER — Encounter: Payer: Self-pay | Admitting: Infectious Disease

## 2015-03-29 VITALS — BP 151/73 | HR 61 | Temp 97.5°F | Wt 145.0 lb

## 2015-03-29 DIAGNOSIS — R55 Syncope and collapse: Secondary | ICD-10-CM

## 2015-03-29 DIAGNOSIS — F411 Generalized anxiety disorder: Secondary | ICD-10-CM | POA: Diagnosis not present

## 2015-03-29 DIAGNOSIS — Z720 Tobacco use: Secondary | ICD-10-CM | POA: Diagnosis not present

## 2015-03-29 DIAGNOSIS — B20 Human immunodeficiency virus [HIV] disease: Secondary | ICD-10-CM

## 2015-03-29 DIAGNOSIS — I10 Essential (primary) hypertension: Secondary | ICD-10-CM

## 2015-03-29 NOTE — Progress Notes (Signed)
Chief complaint: Follow-up for HIV on medication still with some grief due to loss of her daughter last April Subjective:    Patient ID: Jessica [name], female    DOB: 06-Feb-1941, 75 y.o.   MRN: [id number]  HPI   Jessica [name] is a 75 y.o. female with HIV infection who is doing superbly well on her antiviral regimen, changed from Atripla to [facility] with undetectable viral load and health cd4 count.   Lab Results  Component Value Date   HIV1RNAQUANT <20 03/16/2015   Lab Results  Component Value Date   CD4TABS 2300 03/16/2015   CD4TABS 1990 12/23/2014   CD4TABS 1830 08/19/2014    She does have PVD and is seen by Cardiology, [facility] having undergon bypass surgery and seen by Dr Eppie Gibson in [facility]  She is having her narcotic regimen fine tuned esp given her recent falls.  She still grieving the loss of her daughter but is improved proved she still has not begun to try to clean her daughter's room due to causing compressive symptoms. She is typically accompanied by family member everywhere she goes. She declined offer for counseling today and says that she is doing much better  Past Medical History  Diagnosis Date  . Human immunodeficiency virus disease ([place]) 03/27/1986  . Tobacco abuse 02/19/2012  . Essential hypertension 07/20/2006  . Hyperlipidemia LDL goal < 100 04/05/2012  . 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[place]) 11/01/2011    s/p aortobifem bypass 2009   . Lumbar degenerative disc disease 07/20/2006    With chronic back pain   . 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 Small bowel obstruction due to adhesions ([place]) 02/08/2012    s/p Exploratory laparotomy, lysis of adhesions 02/12/12    . Constipation due to pain medication 04/27/2010  . Diverticulosis 02/08/2012    Extensive left-sided diverticula on colonoscopy March 2012 per Dr. Gala Romney   . Genital herpes 07/20/2006  . Glaucoma of left eye  07/20/2006  . Seborrhea 09/01/2010  . Anxiety 04/05/2012  . Bursitis of right shoulder 07/12/2012    s/p shoulder injection 07/12/2012   . Voiding dysfunction     s/p cystoscopy and meatal dilation Dec 2005  . Cataract of right eye   . Heart murmur 1961  . Right rotator cuff tear 02/01/2013    Responds to periodic steroid injections  . Periumbilical hernia 123XX123    1 cm left periumbilical abdominal wall defect  . 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  Hearing aides with possible masking of tinnitus recommended but patient wished to defer secondary to finances.  . Blood transfusion without reported diagnosis     pt denies  . Vasovagal syncope 02/15/2015    Past Surgical History  Procedure Laterality Date  . Eye surgery    . Aorto-femoral bypass graft  04/2007  . Appendectomy    . Cholecystectomy    . Colectomy  01/2011    Dr. Margot Chimes; "took out 12 inches of small intestiines and removed blockage"  . Abdominal hysterectomy    . Laparotomy  02/12/2012    Procedure: EXPLORATORY LAPAROTOMY;  Surgeon: Stark Klein, MD;  Location: [facility] OR;  Service: General;  Laterality: N/A;  Exploratory Laparotomy, lysis of adhesions  . Breast surgery  Breast biopsy: negative  . Small intestine surgery      Family History  Problem Relation Age of Onset  . Kidney failure Mother   . Diabetes Mother   . Hypertension Mother   . Heart disease Mother   . Glaucoma Father   . Congestive Heart Failure Sister   . Diabetes Sister   . Kidney disease Sister   . Diabetes Brother   . Unexplained death Brother 52    Automobile accident  . Hypothyroidism Daughter   . Arthritis Daughter     Neck/Back  . Healthy Son   . HIV/AIDS Brother   . HIV Daughter   . Kidney disease Daughter   . Arthritis Son     Knee      Social History   Social History  .  Marital Status: Widowed    Spouse Name: N/A  . Number of Children: 4  . Years of Education: 2y college   Occupational History  . retired     previously worked as a Designer, fashion/clothing for [employer] History Main Topics  . Smoking status: Current Every Day Smoker -- 0.40 packs/day for 50 years    Types: Cigarettes  . Smokeless tobacco: Never Used  . Alcohol Use: No     Comment: "last drink of alcohol ~ 1977"  . Drug Use: No     Comment: cutting back on chantix, 1 cigarette this morning  . Sexual Activity: No   Other Topics Concern  . None   Social History Narrative   Lives alone in [place], [place]    Allergies  Allergen Reactions  . Hctz [Hydrochlorothiazide] Other (See Comments)    Dizziness, syncope. Does not wish to take anymore     Current outpatient prescriptions:  .  acyclovir (ZOVIRAX) 400 MG tablet, TAKE 1 TABLET BY MOUTH THREE TIMES DAILY AS NEEDED FOR OUTBREAKS FOR 7 DAYS, Disp: 21 tablet, Rfl: 2 .  alendronate (FOSAMAX) 70 MG tablet, Take 1 tablet (70 mg total) by mouth once a week. (Patient taking differently: Take 70 mg by mouth once a week. Takes on Sunday), Disp: 12 tablet, Rfl: 3 .  ALPRAZolam (XANAX) 1 MG tablet, Take 1 tablet (1 mg total) by mouth 3 (three) times daily as needed for anxiety., Disp: 90 tablet, Rfl: 5 .  amLODipine (NORVASC) 10 MG tablet, TAKE 1 TABLET BY MOUTH DAILY., Disp: 90 tablet, Rfl: 3 .  aspirin 81 MG EC tablet, Take 81 mg by mouth daily.  , Disp: , Rfl:  .  atenolol (TENORMIN) 50 MG tablet, Take 1 tablet (50 mg total) by mouth daily., Disp: 90 tablet, Rfl: 3 .  atorvastatin (LIPITOR) 10 MG tablet, Take 1 tablet (10 mg total) by mouth daily., Disp: 30 tablet, Rfl: 11 .  benazepril (LOTENSIN) 40 MG tablet, TAKE 1 TABLET BY MOUTH EVERY DAY, Disp: 30 tablet, Rfl: 11 .  calcium-vitamin D (OSCAL WITH D) 500-200 MG-UNIT per tablet, Take 1 tablet by mouth 2 (two) times daily., Disp: , Rfl:  .  COMBIGAN 0.2-0.5 % ophthalmic solution, Place 1 drop  into the left eye 2 (two) times daily., Disp: , Rfl:  .  cyclobenzaprine (FLEXERIL) 10 MG tablet, Take 1 tablet (10 mg total) by mouth 3 (three) times daily as needed for muscle spasms., Disp: 90 tablet, Rfl: 11 .  docusate sodium (COLACE) 100 MG capsule, Take 100 mg by mouth 2 (two) times daily., Disp: , Rfl:  .  dolutegravir (TIVICAY) 50 MG tablet, Take  1 tablet (50 mg total) by mouth daily., Disp: 30 tablet, Rfl: 11 .  emtricitabine-tenofovir AF (DESCOVY) 200-25 MG tablet, Take 1 tablet by mouth daily., Disp: 30 tablet, Rfl: 11 .  fentaNYL (DURAGESIC) 50 MCG/HR, Place 1 patch (50 mcg total) onto the skin every 3 (three) days., Disp: 10 patch, Rfl: 0 .  HYDROcodone-acetaminophen (NORCO) 10-325 MG tablet, Take 1 tablet by mouth every 8 (eight) hours as needed for moderate pain., Disp: 85 tablet, Rfl: 0 .  ibuprofen (ADVIL,MOTRIN) 200 MG tablet, Take 400 mg by mouth 3 (three) times daily as needed for pain., Disp: , Rfl:  .  magnesium hydroxide (MILK OF MAGNESIA) 800 MG/5ML suspension, Take 30 mLs by mouth daily. (Patient taking differently: Take 30 mLs by mouth daily as needed for constipation or heartburn. ), Disp: 240 mL, Rfl: 0 .  megestrol (MEGACE) 40 MG tablet, Take 2 tablets (80 mg total) by mouth 2 (two) times daily., Disp: 360 tablet, Rfl: 3 .  Multiple Vitamin (MULTIVITAMIN WITH MINERALS) TABS, Take 1 tablet by mouth daily., Disp: , Rfl:  .  pantoprazole (PROTONIX) 40 MG tablet, Take 1 tablet (40 mg total) by mouth daily., Disp: 30 tablet, Rfl: 11 .  sorbitol 70 % solution, Take 15 mLs by mouth daily as needed., Disp: 473 mL, Rfl: 0 .  spironolactone (ALDACTONE) 50 MG tablet, Take 1 tablet (50 mg total) by mouth daily., Disp: 90 tablet, Rfl: 3 .  triamcinolone ointment (KENALOG) 0.1 %, APPLY TO AFFECTED AREA TWICE DAILY, Disp: 454 g, Rfl: 3        Review of Systems  Constitutional: Negative for chills, diaphoresis, activity change, appetite change, fatigue and unexpected weight  change.  HENT: Negative for congestion, ear pain, rhinorrhea, sinus pressure, sneezing, sore throat and trouble swallowing.   Eyes: Negative for photophobia and visual disturbance.  Respiratory: Negative for cough, chest tightness, shortness of breath, wheezing and stridor.   Cardiovascular: Negative for palpitations and leg swelling.  Gastrointestinal: Negative for nausea, vomiting, diarrhea, constipation, blood in stool, abdominal distention and anal bleeding.  Genitourinary: Negative for hematuria, flank pain and difficulty urinating.  Musculoskeletal: Negative for myalgias, joint swelling, arthralgias and gait problem.  Skin: Negative for color change, pallor and wound.  Neurological: Negative for dizziness, tremors and light-headedness.  Hematological: Negative for adenopathy. Does not bruise/bleed easily.  Psychiatric/Behavioral: Negative for behavioral problems, confusion, sleep disturbance, dysphoric mood, decreased concentration and agitation.        Objective:   Physical Exam  Constitutional: She is oriented to person, place, and time. She appears well-developed and well-nourished. No distress.  HENT:  Head: Normocephalic and atraumatic.  Mouth/Throat: No oropharyngeal exudate.  Eyes: Conjunctivae and EOM are normal. No scleral icterus.  Neck: Normal range of motion. Neck supple.  Cardiovascular: Normal rate and regular rhythm.   Pulmonary/Chest: Effort normal. No respiratory distress. She has no wheezes.  Abdominal: She exhibits no distension.  Musculoskeletal: She exhibits no edema or tenderness.  Neurological: She is alert and oriented to person, place, and time. She exhibits normal muscle tone. Coordination normal.  Skin: Skin is warm and dry. No rash noted. She is not diaphoretic. No erythema. No pallor.  Psychiatric: She has a normal mood and affect. Her behavior is normal. Judgment and thought content normal.          Assessment & Plan:   HIV: change to [place]  and [place] and recheck labs in 6 months, renew SPAP  HTN being managed by Dr. Eppie Gibson.  Falls  and 2 syncopal events: She needs to exercise extreme caution with vagal mediated responses and I agree with trying to reduce her sedating meds  Grief with loss of her daughter (who was also my patient and who was found deceased at home by Judson Roch). Daughter had IVDU problem was on methadone and then cut off from the drug. Suspicion was that it was a drug-related overdose causing her death.  I've counseled her again today and offered her counseling with Jody but she did not want to see a counselor. I made her aware of [facility].  Gastroesophageal reflux disease  Continue her proton pump inhibitor   Hyperlipidemia: Continue Lipitor just to avoid potential drug drug reaction current cholesterol lowering statin.   I spent greater than 40 minutes with the patient including greater than 50% of time in face to face counsel of the patient and her HIV regimen her syncopal episodes her grief loss of her daughter her hyperlipidemia hypertension, falls and in coordination of  her care.

## 2015-03-30 ENCOUNTER — Encounter: Payer: Self-pay | Admitting: Internal Medicine

## 2015-03-30 ENCOUNTER — Ambulatory Visit (INDEPENDENT_AMBULATORY_CARE_PROVIDER_SITE_OTHER): Payer: Medicare Other | Admitting: Internal Medicine

## 2015-03-30 VITALS — BP 149/66 | HR 69 | Temp 98.4°F | Ht 68.0 in | Wt 143.9 lb

## 2015-03-30 DIAGNOSIS — M7551 Bursitis of right shoulder: Secondary | ICD-10-CM | POA: Diagnosis not present

## 2015-03-30 DIAGNOSIS — M75101 Unspecified rotator cuff tear or rupture of right shoulder, not specified as traumatic: Secondary | ICD-10-CM

## 2015-03-30 NOTE — Progress Notes (Signed)
[facility] Subjective:   Patient ID: Jessica [name] female   DOB: May 07, 1940 75 y.o.   MRN: [id number]  HPI: Ms.Jessica Jenetta Downer Danton [name] is a 75 y.o. female with a PMH detailed below who presents for for right shoulder pain requesting a steroid injection.  Please see problem based charting below for the status of her chronic medical problems.    Past Medical History  Diagnosis Date  . Human immunodeficiency virus disease ([place]) 03/27/1986  . Tobacco abuse 02/19/2012  . Essential hypertension 07/20/2006  . Hyperlipidemia LDL goal < 100 04/05/2012  . 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[place]) 11/01/2011    s/p aortobifem bypass 2009   . Lumbar degenerative disc disease 07/20/2006    With chronic back pain   . 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 Small bowel obstruction due to adhesions ([place]) 02/08/2012    s/p Exploratory laparotomy, lysis of adhesions 02/12/12    . Constipation due to pain medication 04/27/2010  . Diverticulosis 02/08/2012    Extensive left-sided diverticula on colonoscopy March 2012 per Dr. Gala Romney   . Genital herpes 07/20/2006  . 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 Seborrhea 09/01/2010  . Anxiety 04/05/2012  . Bursitis of right shoulder 07/12/2012    s/p shoulder injection 07/12/2012   . Voiding dysfunction     s/p cystoscopy and meatal dilation Dec 2005  . Cataract of right eye   . Heart murmur 1961  . Right rotator cuff tear 02/01/2013    Responds to periodic steroid injections  . Periumbilical hernia 123XX123    1 cm left periumbilical abdominal wall defect  . 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  Hearing aides with possible masking of tinnitus recommended but patient wished to defer secondary to finances.  . Blood transfusion  without reported diagnosis     pt denies  . Vasovagal syncope 02/15/2015   Current Outpatient Prescriptions  Medication Sig Dispense Refill  . acyclovir (ZOVIRAX) 400 MG tablet TAKE 1 TABLET BY MOUTH THREE TIMES DAILY AS NEEDED FOR OUTBREAKS FOR 7 DAYS 21 tablet 2  . alendronate (FOSAMAX) 70 MG tablet Take 1 tablet (70 mg total) by mouth once a week. (Patient taking differently: Take 70 mg by mouth once a week. Takes on Sunday) 12 tablet 3  . ALPRAZolam (XANAX) 1 MG tablet Take 1 tablet (1 mg total) by mouth 3 (three) times daily as needed for anxiety. 90 tablet 5  . amLODipine (NORVASC) 10 MG tablet TAKE 1 TABLET BY MOUTH DAILY. 90 tablet 3  . aspirin 81 MG EC tablet Take 81 mg by mouth daily.      Marland Kitchen atenolol (TENORMIN) 50 MG tablet Take 1 tablet (50 mg total) by mouth daily. 90 tablet 3  . atorvastatin (LIPITOR) 10 MG tablet Take 1 tablet (10 mg total) by mouth daily. 30 tablet 11  . benazepril (LOTENSIN) 40 MG tablet TAKE 1 TABLET BY MOUTH EVERY DAY 30 tablet 11  . calcium-vitamin D (OSCAL WITH D) 500-200 MG-UNIT per tablet Take 1 tablet by mouth 2 (two) times daily.    . COMBIGAN 0.2-0.5 % ophthalmic solution Place 1 drop into the left eye 2 (two) times daily.    . cyclobenzaprine (FLEXERIL) 10 MG tablet Take 1 tablet (  10 mg total) by mouth 3 (three) times daily as needed for muscle spasms. 90 tablet 11  . docusate sodium (COLACE) 100 MG capsule Take 100 mg by mouth 2 (two) times daily.    . dolutegravir (TIVICAY) 50 MG tablet Take 1 tablet (50 mg total) by mouth daily. 30 tablet 11  . emtricitabine-tenofovir AF (DESCOVY) 200-25 MG tablet Take 1 tablet by mouth daily. 30 tablet 11  . fentaNYL (DURAGESIC) 50 MCG/HR Place 1 patch (50 mcg total) onto the skin every 3 (three) days. 10 patch 0  . HYDROcodone-acetaminophen (NORCO) 10-325 MG tablet Take 1 tablet by mouth every 8 (eight) hours as needed for moderate pain. 85 tablet 0  . ibuprofen (ADVIL,MOTRIN) 200 MG tablet Take 400 mg by mouth 3  (three) times daily as needed for pain.    . magnesium hydroxide (MILK OF MAGNESIA) 800 MG/5ML suspension Take 30 mLs by mouth daily. (Patient taking differently: Take 30 mLs by mouth daily as needed for constipation or heartburn. ) 240 mL 0  . megestrol (MEGACE) 40 MG tablet Take 2 tablets (80 mg total) by mouth 2 (two) times daily. 360 tablet 3  . Multiple Vitamin (MULTIVITAMIN WITH MINERALS) TABS Take 1 tablet by mouth daily.    . pantoprazole (PROTONIX) 40 MG tablet Take 1 tablet (40 mg total) by mouth daily. 30 tablet 11  . sorbitol 70 % solution Take 15 mLs by mouth daily as needed. 473 mL 0  . spironolactone (ALDACTONE) 50 MG tablet Take 1 tablet (50 mg total) by mouth daily. 90 tablet 3  . triamcinolone ointment (KENALOG) 0.1 % APPLY TO AFFECTED AREA TWICE DAILY 454 g 3   No current facility-administered medications for this visit.   Family History  Problem Relation Age of Onset  . Kidney failure Mother   . Diabetes Mother   . Hypertension Mother   . Heart disease Mother   . Glaucoma Father   . Congestive Heart Failure Sister   . Diabetes Sister   . Kidney disease Sister   . Diabetes Brother   . Unexplained death Brother 6    Automobile accident  . Hypothyroidism Daughter   . Arthritis Daughter     Neck/Back  . Healthy Son   . HIV/AIDS Brother   . HIV Daughter   . Kidney disease Daughter   . Arthritis Son     Knee   Social History   Social History  . Marital Status: Widowed    Spouse Name: N/A  . Number of Children: 4  . Years of Education: 2y college   Occupational History  . retired     previously worked as a Designer, fashion/clothing for [employer] History Main Topics  . Smoking status: Current Every Day Smoker -- 0.40 packs/day for 50 years    Types: Cigarettes  . Smokeless tobacco: Never Used     Comment: 1/2 PPD  . Alcohol Use: No     Comment: "last drink of alcohol ~ 1977"  . Drug Use: No     Comment: cutting back on chantix, 1 cigarette this morning   . Sexual Activity: No   Other Topics Concern  . None   Social History Narrative   Lives alone in [place], [place]   Review of Systems: Review of Systems  Constitutional: Negative for fever and chills.  Musculoskeletal: Positive for joint pain. Negative for back pain, falls and neck pain.  Neurological: Negative for tingling and sensory change.  Objective:  Physical Exam: Filed Vitals:   03/30/15 1431  BP: 149/66  Pulse: 69  Temp: 98.4 F (36.9 C)  TempSrc: Oral  Height: 5\' 8"  (1.727 m)  Weight: 143 lb 14.4 oz (65.273 kg)  SpO2: 97%  Physical Exam  Constitutional: She is well-developed, well-nourished, and in no distress.  HENT:  Head: Normocephalic and atraumatic.  Musculoskeletal:       Right shoulder: She exhibits decreased range of motion, tenderness and pain. She exhibits no swelling, no effusion, no crepitus and no deformity.       Left shoulder: She exhibits normal range of motion and no tenderness.       Cervical back: She exhibits normal range of motion and no tenderness.  neers and hawkin's test positive on right, tenderness at [facility] joint on right  Nursing note and vitals reviewed.   Assessment & Plan:  Case discussed and patient seen with Dr. Eppie Gibson  Right rotator cuff tear HPI: Patient has a history of pain in her right shoulder since 2015 when her dog pulled away while she was holding him on a lesh.  She had previously responded well to steroid injections to her subacromial bursa with the last injection 1 year ago.  She currently reports 8/10 pain, this pain does not respond well to her Norco, fentanyl, or ibuprofen.  She presents today requesting a steroid injection of her right shoulder.  A: Right rotator cuff tendinopathy with sub acromial bursitis.  P: - The right sub acromial bursa was injected with a mixture of kenalog and lidocaine, see procedure note.    Medications Ordered No orders of the defined types were placed in this encounter.   Other  Orders No orders of the defined types were placed in this encounter.   Follow Up: Return if symptoms worsen or fail to improve.

## 2015-03-30 NOTE — Patient Instructions (Signed)
General Instructions: Please call if you develop worsening pain, redness, or fever.  Please bring your medicines with you each time you come to clinic.  Medicines may include prescription medications, over-the-counter medications, herbal remedies, eye drops, vitamins, or other pills.   Progress Toward Treatment Goals:  Treatment Goal 11/19/2014  Blood pressure unchanged  Stop smoking smoking the same amount  Prevent falls -    Self Care Goals & Plans:  Self Care Goal 09/24/2014  Manage my medications take my medicines as prescribed; bring my medications to every visit; refill my medications on time  Monitor my health -  Eat healthy foods drink diet soda or water instead of juice or soda; eat more vegetables; eat fruit for snacks and desserts; eat baked foods instead of fried foods; eat foods that are low in salt  Be physically active take a walk every day; find an activity I enjoy  Stop smoking cut down the number of cigarettes smoked  Meeting treatment goals -    No flowsheet data found.   Care Management & Community Referrals:  Referral 11/19/2014  Referrals made for care management support none needed  Referrals made to community resources none

## 2015-03-30 NOTE — Progress Notes (Signed)
Procedure note Preprocedure diagnosis: Right Subacrominal bursitis Post Procedure diagnosis: Right Subacromial bursitis After informed written consent, patient was seated on exam table. Right shoulder was prepped with betadine and utilizing lateral approach, patient's right subacromial space was injected with mixture of 40mg  of kenalog and 1cc of 1% lidocaine. Patient tolerated the procedure well without immediate complications.  Entire procedure was supervised by Dr Eppie Gibson.  Lucious Groves, DO

## 2015-03-30 NOTE — Progress Notes (Signed)
I saw and evaluated the patient. I personally confirmed the key portions of Dr. Jodene Nam history and exam and reviewed pertinent patient test results. The assessment, diagnosis, and plan were formulated together and I agree with the documentation in the resident's note.  A time out was performed to make sure we had the right patient for the right procedure at the right location (right shoulder).  I was present at Dr. Jodene Nam side for the entire procedure.  The patient tolerated the procedure well with some early improvement in her right shoulder symptoms.  As she brought in the previous narcotic prescriptions that had previously been lost we will resume giving her 3 months worth of prescriptions at a time to spare her the long trip to pick them up on a monthly basis.

## 2015-03-30 NOTE — Assessment & Plan Note (Signed)
HPI: Patient has a history of pain in her right shoulder since 2015 when her dog pulled away while she was holding him on a lesh.  She had previously responded well to steroid injections to her subacromial bursa with the last injection 1 year ago.  She currently reports 8/10 pain, this pain does not respond well to her Norco, fentanyl, or ibuprofen.  She presents today requesting a steroid injection of her right shoulder.  A: Right rotator cuff tendinopathy with sub acromial bursitis.  P: - The right sub acromial bursa was injected with a mixture of kenalog and lidocaine, see procedure note.

## 2015-03-31 ENCOUNTER — Ambulatory Visit: Payer: Medicare Other | Admitting: Pharmacist

## 2015-04-01 ENCOUNTER — Ambulatory Visit: Payer: Medicare Other | Admitting: Pharmacist

## 2015-04-01 DIAGNOSIS — M5136 Other intervertebral disc degeneration, lumbar region: Secondary | ICD-10-CM

## 2015-04-01 DIAGNOSIS — Z719 Counseling, unspecified: Secondary | ICD-10-CM

## 2015-04-01 DIAGNOSIS — Z79891 Long term (current) use of opiate analgesic: Secondary | ICD-10-CM

## 2015-04-01 MED ORDER — NALOXONE HCL 0.4 MG/0.4ML IJ SOAJ: 0.4000 mg | Freq: Once | INTRAMUSCULAR | Status: DC | PRN

## 2015-04-01 MED FILL — *EVZIO 0.4 MG AUTO-INJECTOR: 0.4 | 2 days supply | Qty: 1 | Fill #0

## 2015-04-01 NOTE — Progress Notes (Signed)
Jessica [name] is a 75 y.o. female reports to clinical pharmacist appointment with her daughter for naloxone education.   Naloxone (Evzio) supplied by [facility] outpatient pharmacy and reviewed with the patient and daughter, including name, instructions, indication, and safe use. Advised to contact clinic if medication expires.  Patient and daughter also watched [facility] opiod education video and completed the controlled medication treatment agreement form (documented under FYI tab in Epic for team use).  Patient and family verbalized understanding by repeating back information and was advised to contact me if further medication-related questions arise. Patient was also provided an information handout.

## 2015-04-01 NOTE — Patient Instructions (Signed)
Naloxone (also called Narcan or Evzio) is an antidote for opioid overdose. It works by neutralizing the opioids in your system and helping you breathe again. Naloxone only works if a person has opioids in their system; the medication doesn't work on other drugs. You can't get high from it and it is safe for nearly everyone. It's a Therapist, sports and has been used in programs all over the world.  The main sign of overdose is unresponsiveness. Other signs include: not breathing, turning blue, deep snoring, vomiting, gasping, gurgling.  If you suspect an overdose, 1. CALL 911  2. Start rescue breathing  3. To give Evzio (naloxone) injection:   If possible, put on gloves and prepare injection site with alcohol pad  Follow instructions below, then continue rescue breathing  Pull Evzio from the outer case   Pull off the red safety guard   Place the black end against the middle of the patient's outer thigh (through pants if necessary). Press firmly and hold in place for 5 seconds after you hear the "click" and "hiss" sounds.   If patient does not respond within 2 to 3 minutes, give another dose using the second Evzio in the package.  Store [employer] at room temperature. Protect from light. Do not freeze.

## 2015-04-06 ENCOUNTER — Other Ambulatory Visit: Payer: Self-pay | Admitting: Internal Medicine

## 2015-04-06 DIAGNOSIS — B20 Human immunodeficiency virus [HIV] disease: Secondary | ICD-10-CM

## 2015-04-26 ENCOUNTER — Other Ambulatory Visit: Payer: Self-pay | Admitting: Infectious Disease

## 2015-04-26 DIAGNOSIS — B029 Zoster without complications: Secondary | ICD-10-CM

## 2015-05-26 ENCOUNTER — Telehealth: Payer: Self-pay | Admitting: *Deleted

## 2015-05-26 NOTE — Telephone Encounter (Signed)
Pt states she had become constipated within the last 10 days, Sunday she had 3 large bm's and has had several since then, on Sunday she noted blood in the commode with the bowel movements and with everyone since there has been some blood with every stool, getting less every time and none today. Refused earlier appt or ED, stated she will wait to see her md

## 2015-06-04 ENCOUNTER — Encounter: Payer: Self-pay | Admitting: Internal Medicine

## 2015-06-04 ENCOUNTER — Ambulatory Visit (INDEPENDENT_AMBULATORY_CARE_PROVIDER_SITE_OTHER): Payer: Medicare Other | Admitting: Internal Medicine

## 2015-06-04 VITALS — BP 161/70 | HR 90 | Temp 97.9°F | Wt 151.0 lb

## 2015-06-04 DIAGNOSIS — M75101 Unspecified rotator cuff tear or rupture of right shoulder, not specified as traumatic: Secondary | ICD-10-CM | POA: Diagnosis not present

## 2015-06-04 DIAGNOSIS — G8929 Other chronic pain: Secondary | ICD-10-CM

## 2015-06-04 DIAGNOSIS — K5903 Drug induced constipation: Secondary | ICD-10-CM

## 2015-06-04 DIAGNOSIS — Z0000 Encounter for general adult medical examination without abnormal findings: Secondary | ICD-10-CM

## 2015-06-04 DIAGNOSIS — B20 Human immunodeficiency virus [HIV] disease: Secondary | ICD-10-CM

## 2015-06-04 DIAGNOSIS — I10 Essential (primary) hypertension: Secondary | ICD-10-CM

## 2015-06-04 DIAGNOSIS — M810 Age-related osteoporosis without current pathological fracture: Secondary | ICD-10-CM

## 2015-06-04 DIAGNOSIS — Z21 Asymptomatic human immunodeficiency virus [HIV] infection status: Secondary | ICD-10-CM

## 2015-06-04 DIAGNOSIS — M51369 Other intervertebral disc degeneration, lumbar region without mention of lumbar back pain or lower extremity pain: Secondary | ICD-10-CM

## 2015-06-04 DIAGNOSIS — M5136 Other intervertebral disc degeneration, lumbar region: Secondary | ICD-10-CM

## 2015-06-04 DIAGNOSIS — K5732 Diverticulitis of large intestine without perforation or abscess without bleeding: Secondary | ICD-10-CM | POA: Diagnosis not present

## 2015-06-04 DIAGNOSIS — Z1231 Encounter for screening mammogram for malignant neoplasm of breast: Secondary | ICD-10-CM

## 2015-06-04 MED ORDER — METRONIDAZOLE 500 MG PO TABS: 500.0000 mg | ORAL_TABLET | Freq: Three times a day (TID) | ORAL | Status: DC

## 2015-06-04 MED ORDER — FENTANYL 50 MCG/HR TD PT72: 50.0000 ug | MEDICATED_PATCH | TRANSDERMAL | Status: DC

## 2015-06-04 MED ORDER — CIPROFLOXACIN HCL 500 MG PO TABS: 500.0000 mg | ORAL_TABLET | Freq: Two times a day (BID) | ORAL | Status: DC

## 2015-06-04 MED ORDER — HYDROCODONE-ACETAMINOPHEN 10-325 MG PO TABS: 1.0000 | ORAL_TABLET | Freq: Three times a day (TID) | ORAL | Status: DC | PRN

## 2015-06-04 NOTE — Assessment & Plan Note (Signed)
Assessment  Her chronic low back pain is stable on the fentanyl 50 g every 3 days, hydrocortisone-acetaminophen 10-325 mg 1 tablet every 8 hours dispense #85, ibuprofen 400 mg by mouth 3 times daily as necessary, and cyclobenzaprine 10 mg by mouth every 8 hours as needed. This regimen allows her to function in her community. Of note, she is receiving less hydrocodone-acetaminophen per month than during the last visit and not experiencing any decrement in the quality of her chronic pain control. More importantly, she's had no further falls since decreasing the total number of hydrocodone-acetaminophen tablets per month.  Plan  We will continue with the fentanyl, cyclobenzaprine, and ibuprofen. The hydrocodone-acetaminophen 10-325 mg will be tapered to 80 tablets per month. We will reassess the quality of her chronic pain control as we continue to try to wean her short acting narcotic to a more reasonable morphine equivalent per month in the setting of chronic benzodiazepine use. She was given 3 paper prescriptions for the fentanyl and hydrocodone-acetaminophen for the months of May, June, and July.

## 2015-06-04 NOTE — Assessment & Plan Note (Signed)
Assessment  She's had some recent constipation despite her extensive bowel regimen which has previously been excellent in controlling her constipation related to her narcotic medication.  Plan  We'll continue with her bowel regimen and reassess her control at the follow-up visit.

## 2015-06-04 NOTE — Assessment & Plan Note (Signed)
Assessment  Jessica [name] notes blood in her stools 2 days prior to admission after a bout of constipation. She also notes some subjective fevers and left lower quadrant pain since that time. Examination reveals left lower quadrant tenderness to palpation with mild rebound consistent with acute diverticulitis. She has known diverticulosis in this area and this pain is similar to a previous bout of diverticulitis.  Plan  We will treat with a one-week course of ciprofloxacin 500 mg by mouth twice daily and metronidazole 500 mg by mouth 3 times daily. She was instructed to call by the middle of next week if she is not symptomatically improved on these antibiotics for reassessment.

## 2015-06-04 NOTE — Assessment & Plan Note (Signed)
Assessment  It has been over 2 years since her last DEXA scan and she is due for reassessment of the success of her alendronate, calcium, and vitamin D at maintaining her current bone health.  Plan  She was scheduled for a bone density examination to reassess her osteoporosis. This test result will be followed up upon when completed.

## 2015-06-04 NOTE — Assessment & Plan Note (Signed)
She is due for a DEXA scan and mammogram. These orders were placed. We will follow-up on the results when the tests are completed.

## 2015-06-04 NOTE — Assessment & Plan Note (Signed)
Assessment  Her blood pressure is elevated today at 161/70. This is while she has some abdominal pain. Unfortunately, I cannot attribute all of this hypertension to the abdominal pain given her chronic essential hypertension. Her current regimen includes amlodipine 10 mg by mouth daily, atenolol 50 mg by mouth daily, benazepril 40 mg by mouth daily, and spironolactone 50 mg by mouth daily.  Plan  We will continue with the above antihypertensive regimen and treat her acute diverticulitis with a one-week course of antibiotics and reassess her blood pressure at the follow-up visit.

## 2015-06-04 NOTE — Addendum Note (Signed)
Addended by: Marcelino Duster on: 06/04/2015 04:20 PM   Modules accepted: Orders

## 2015-06-04 NOTE — Assessment & Plan Note (Signed)
Assessment  The pain associated with her right rotator cuff tear has improved with the right shoulder steroid injection. That being said, she notes some continued pain but this is tolerable and she is not interested in any further intervention.  Plan  We will reassess the pain control in the right shoulder at the follow-up visit. We will consider re-injection of the right shoulder at that time if needed. We discussed the option of surgery to reassess the right shoulder rotator cuff tear and she was not interested at this time as her pain was not considered to be that significant after the right shoulder injection.

## 2015-06-04 NOTE — Assessment & Plan Note (Signed)
Assessment  Her HIV disease is well controlled on the [place] and [place]. She is followed closely in the [facility].  Plan  She was encouraged to continue her HIV regimen as prescribed by Dr. Tommy Medal in the [facility] given her excellent control.

## 2015-06-04 NOTE — Patient Instructions (Addendum)
It was good to see you again.  I am glad you are doing better.  I am also glad to hear the falls have stopped since we weaned down the Vicodin.  1) We decreased the vicodin to #80/month and will keep it there for 3 months.  I gave you the next three months of Vicodin and fentanyl.  2) Keep taking the other medications as you are.  3) Consider making an appointment with your dentist to see if he or she can fit you with a piece to help with the teeth grinding.  4) We ordered a mammogram and a DEXA scan for screening.  5) Let me know if you want any help with quitting smoking.  6) We started antibiotics for your right lower belly pain which I think is your diverticulitis.  Take 1 week of Cipro 500 mg twice daily and Flagyl 500 mg three times daily.  If there is no improvement please let us know.  I will see you in 3 months, sooner if necessary.

## 2015-06-04 NOTE — Progress Notes (Signed)
   Subjective:    Patient ID: Jessica [name], female    DOB: 06/10/1940, 75 y.o.   MRN: [id number]  HPI  Jessica [name] is here for reevaluation of her chronic pain on a lower number of hydrocodone tablets per month. Please see the A&P for the status of the pt's chronic medical problems.  Review of Systems  Constitutional: Negative for activity change and appetite change.  Gastrointestinal: Positive for abdominal pain, constipation and anal bleeding.  Musculoskeletal: Positive for back pain and arthralgias. Negative for joint swelling.       Right shoulder pain improved since injection but not resolved.  Neurological: Positive for headaches. Negative for dizziness, syncope and light-headedness.      Objective:   Physical Exam  Constitutional: She is oriented to person, place, and time. She appears well-developed and well-nourished. No distress.  HENT:  Head: Normocephalic and atraumatic.  Eyes: Conjunctivae are normal. Right eye exhibits no discharge. Left eye exhibits no discharge. No scleral icterus.  Abdominal: Soft. Bowel sounds are normal. She exhibits no distension. There is tenderness. There is rebound. There is no guarding.  LLQ point tenderness to palpation with mild rebound.  No guarding.  Musculoskeletal: Normal range of motion. She exhibits no edema or tenderness.  Neurological: She is alert and oriented to person, place, and time. She exhibits normal muscle tone.  Skin: Skin is warm and dry. No rash noted. She is not diaphoretic. No erythema.  Psychiatric: She has a normal mood and affect. Her behavior is normal. Judgment and thought content normal.  Nursing note and vitals reviewed.     Assessment & Plan:   Please see problem oriented charting.

## 2015-06-07 ENCOUNTER — Other Ambulatory Visit: Payer: Self-pay | Admitting: Infectious Disease

## 2015-06-07 ENCOUNTER — Other Ambulatory Visit: Payer: Self-pay | Admitting: Internal Medicine

## 2015-06-07 DIAGNOSIS — R21 Rash and other nonspecific skin eruption: Secondary | ICD-10-CM

## 2015-06-07 NOTE — Telephone Encounter (Signed)
Rx called into pharmacy .Despina Hidden Cassady3/27/20171:34 PM

## 2015-06-10 ENCOUNTER — Other Ambulatory Visit: Payer: Self-pay | Admitting: Internal Medicine

## 2015-06-14 ENCOUNTER — Other Ambulatory Visit: Payer: Self-pay | Admitting: Internal Medicine

## 2015-06-14 NOTE — Telephone Encounter (Signed)
Fentanyl and hydrocodone were filled and given (documented on AVS) at [place] 3/24.  Xanax called in to pharmacy 3/27.  Tried to call patient but number rings and then disconnects, unable to LVM

## 2015-06-14 NOTE — Telephone Encounter (Signed)
Pt requesting alprazolam, hydrocodone and fentanyl to be filled.

## 2015-06-15 NOTE — Telephone Encounter (Signed)
Received call from patient-stating she has rxs for May, June, and July, but not one for April. There is a rx dated for April in pt's file.  Pt informed that she may pick up rx today.Regenia Skeeter, Darlene Cassady4/4/20179:31 AM

## 2015-06-30 ENCOUNTER — Encounter: Payer: Self-pay | Admitting: *Deleted

## 2015-07-05 ENCOUNTER — Other Ambulatory Visit: Payer: Self-pay | Admitting: Internal Medicine

## 2015-07-05 DIAGNOSIS — F411 Generalized anxiety disorder: Secondary | ICD-10-CM

## 2015-07-05 NOTE — Telephone Encounter (Signed)
Called to pharmacy 

## 2015-08-04 ENCOUNTER — Other Ambulatory Visit: Payer: Self-pay | Admitting: Internal Medicine

## 2015-08-04 DIAGNOSIS — M5136 Other intervertebral disc degeneration, lumbar region: Secondary | ICD-10-CM

## 2015-08-13 ENCOUNTER — Ambulatory Visit (INDEPENDENT_AMBULATORY_CARE_PROVIDER_SITE_OTHER): Payer: Medicare Other | Admitting: Internal Medicine

## 2015-08-13 ENCOUNTER — Encounter: Payer: Self-pay | Admitting: Internal Medicine

## 2015-08-13 VITALS — BP 164/72 | HR 67 | Temp 98.2°F | Wt 155.4 lb

## 2015-08-13 DIAGNOSIS — K5730 Diverticulosis of large intestine without perforation or abscess without bleeding: Secondary | ICD-10-CM | POA: Diagnosis not present

## 2015-08-13 DIAGNOSIS — Z720 Tobacco use: Secondary | ICD-10-CM

## 2015-08-13 DIAGNOSIS — G8929 Other chronic pain: Secondary | ICD-10-CM | POA: Diagnosis present

## 2015-08-13 DIAGNOSIS — K5903 Drug induced constipation: Secondary | ICD-10-CM

## 2015-08-13 DIAGNOSIS — F411 Generalized anxiety disorder: Secondary | ICD-10-CM

## 2015-08-13 DIAGNOSIS — A6000 Herpesviral infection of urogenital system, unspecified: Secondary | ICD-10-CM | POA: Diagnosis not present

## 2015-08-13 DIAGNOSIS — M810 Age-related osteoporosis without current pathological fracture: Secondary | ICD-10-CM

## 2015-08-13 DIAGNOSIS — E785 Hyperlipidemia, unspecified: Secondary | ICD-10-CM | POA: Diagnosis not present

## 2015-08-13 DIAGNOSIS — T40605D Adverse effect of unspecified narcotics, subsequent encounter: Secondary | ICD-10-CM

## 2015-08-13 DIAGNOSIS — B20 Human immunodeficiency virus [HIV] disease: Secondary | ICD-10-CM

## 2015-08-13 DIAGNOSIS — M47816 Spondylosis without myelopathy or radiculopathy, lumbar region: Secondary | ICD-10-CM

## 2015-08-13 DIAGNOSIS — I10 Essential (primary) hypertension: Secondary | ICD-10-CM

## 2015-08-13 DIAGNOSIS — M5136 Other intervertebral disc degeneration, lumbar region: Secondary | ICD-10-CM

## 2015-08-13 DIAGNOSIS — F17200 Nicotine dependence, unspecified, uncomplicated: Secondary | ICD-10-CM

## 2015-08-13 DIAGNOSIS — Z21 Asymptomatic human immunodeficiency virus [HIV] infection status: Secondary | ICD-10-CM | POA: Diagnosis not present

## 2015-08-13 MED ORDER — HYDROCODONE-ACETAMINOPHEN 10-325 MG PO TABS: 1.0000 | ORAL_TABLET | Freq: Three times a day (TID) | ORAL | Status: DC | PRN

## 2015-08-13 MED ORDER — FENTANYL 50 MCG/HR TD PT72: 50.0000 ug | MEDICATED_PATCH | TRANSDERMAL | Status: DC

## 2015-08-13 MED ORDER — SPIRONOLACTONE 100 MG PO TABS: 100.0000 mg | ORAL_TABLET | Freq: Every day | ORAL | Status: DC

## 2015-08-13 MED ORDER — MEGESTROL ACETATE 40 MG PO TABS: 40.0000 mg | ORAL_TABLET | Freq: Two times a day (BID) | ORAL | Status: DC

## 2015-08-13 NOTE — Assessment & Plan Note (Signed)
Assessment  She is tolerating the atorvastatin at 10 mg by mouth daily without myalgias.  Plan  We will continue the atorvastatin at 10 mg by mouth daily, medium intensity, given her underlying peripheral vascular occlusive disease. We will reassess for intolerances to the atorvastatin at the follow-up visit.

## 2015-08-13 NOTE — Assessment & Plan Note (Signed)
Assessment  At the last clinic visit she had an acute episode of acute diverticulitis. She was given a course of antibiotics with complete resolution of her symptoms. She is currently asymptomatic with regards to her diverticulosis.  Plan  We will continue to follow her diverticulosis symptomatically.

## 2015-08-13 NOTE — Assessment & Plan Note (Signed)
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 This is on Colace 100 mg by mouth twice daily, milk of magnesia, and sorbitol 70% as needed. She wishes to continue with this regimen.  Plan  We will continue the Colace, sorbitol, and milk of magnesia. We will reassess the degree of her constipation at the follow-up visit.

## 2015-08-13 NOTE — Assessment & Plan Note (Signed)
Assessment  Her chronic generalized anxiety disorder is symptomatically managed on alprazolam 1 mg by mouth 3 times daily as needed. She feels she continues to require this medication to control her anxiety.  Plan  We will continue the alprazolam at 1 mg by mouth 3 times daily as needed for anxiety and reassess her symptoms on this regimen at the follow-up visit.

## 2015-08-13 NOTE — Assessment & Plan Note (Signed)
Assessment  Her chronic low back pain is unchanged from baseline and she remains functional on her narcotic pain regimen. This is despite slowly weaning the number of hydrocodone tablets she gets each month. We discussed further weaning with the option of either lowing the hydrocodone dose or number of tablets per month. She chose the latter.  Plan  We will continue the fentanyl 50 g placed on skin every 3 days, hydrocodone-acetaminophen 10-325 mg 1 tablet every 8 hours as needed for pain dispense #75 per month, cyclobenzaprine 10 mg by mouth every 8 hours as needed for pain, and ibuprofen 400 mg by mouth 3 times daily as needed for pain. We will reassess her pain control at the follow-up visit in likely continue with the current hydrocodone wean given her morphine milligram equivalents per day with concomitant benzodiazepine use.

## 2015-08-13 NOTE — Assessment & Plan Note (Signed)
Assessment  She continues to smoke. As at each visit, we spent more than 3 minutes discussing the risks of continued smoking, especially in the setting of her underlying peripheral vascular occlusive disease. At this point she remains in the pre-contemplative stage and is not mentally ready to make an attempt to quit smoking. That being said, this is the first time she mentioned that she appreciates my continued inquiry about her smoking and encouragement to quit. She states that at some point she may be ready to quit with this continued conversation.  Plan  We will continue to reassess her readiness for smoking cessation at the follow-up visit and offer pharmacologic therapy, should she be ready.

## 2015-08-13 NOTE — Assessment & Plan Note (Signed)
Assessment  Her blood pressure remains elevated today at 164/72. Her target systolic blood pressure is less than 150 given her age. This is on amlodipine 10 mg by mouth daily, atenolol 50 mg by mouth daily, benazepril 40 mg by mouth daily, and spironolactone 50 mg by mouth daily. She requires further escalation in her pharmacologic regimen but is on maximal doses of amlodipine and benazepril and her pulse limits further escalation of the atenolol dose.  Plan  We will continue the amlodipine at 10 mg by mouth daily, atenolol at 50 mg by mouth daily, benazepril at 40 mg by mouth daily, and increase the spironolactone to 100 mg by mouth daily. We will reassess the blood pressure control at the follow-up visit as well as obtaining a basic metabolic panel to assure the potassium stays within range while on a higher dose of spironolactone.

## 2015-08-13 NOTE — Assessment & Plan Note (Signed)
Assessment  She continues the alendronate 70 mg by mouth weekly as well as the calcium and vitamin D supplementation. It is been more than 3 years since she's had a DEXA scan and is interested in having this performed. She would prefer this be done at [facility].  Plan  We will arrange for a DEXA scan at [facility] and reassess the stability of her osteoporosis on her current regimen once those results have returned.

## 2015-08-13 NOTE — Assessment & Plan Note (Signed)
I reviewed the reason for her hysterectomy and she states it was for a benign cause. Therefore she does not require further Pap smears given the reason for her hysterectomy. She is interested in continuing to follow with mammography and this will be arranged. She is otherwise up-to-date on her health care maintenance.

## 2015-08-13 NOTE — Patient Instructions (Signed)
It was good to see you again.  I am sorry about your recent fall.  1) We lowered the megace dose to 40 mg (1 tablet) twice daily.  I think we can slowly wean this medication off.  2) I lowered the number of hydrocodone tablets to 75/month.  I gave you the written prescriptions for August and September for both the hydrocodone and the fentanyl.  3) We are trying to set up the DEXA scan and mammogram tests.  4) I increased your spironolactone to 100 mg daily.  You can take 2 of the 50 mg until you get the new 100 mg tablets at which point you will go back to 1 tablet.  Keep taking the other medications as you are.  I will see you back in 3 months, sooner if necessary.

## 2015-08-13 NOTE — Assessment & Plan Note (Signed)
Assessment  She continues to have occasional recurrent bouts of genital herpes. This symptomatically responds well to as needed acyclovir. Since the last clinic visit she has required one course of the acyclovir.  Plan  We will continue the as needed acyclovir for her recurrent genital herpes. The frequency of recurrence is low and does not justify chronic suppressive therapy. We will reassess the frequency of her recurrences at the follow-up visit.

## 2015-08-13 NOTE — Assessment & Plan Note (Signed)
Assessment  Her HIV disease remains well controlled on her current regimen that is monitored closely by Dr. Tommy Medal.  Plan  She will continue her antiretroviral therapy with follow-up as [facility]. We discussed the Megace and she admitted it is unlikely contributing to her appetite. It does have several potential side effects and we decided to wean this medication slowly given the chronicity in which she has been on it. She will take Megace 40 mg by mouth twice daily, down from 80 mg by mouth twice daily. At the follow-up visit we will decrease the Megace to 40 mg by mouth daily and then subsequently wean the medication completely off.

## 2015-08-13 NOTE — Progress Notes (Signed)
   Subjective:    Patient ID: Jessica [name], female    DOB: 1940-11-27, 75 y.o.   MRN: [id number]  HPI  Haizley Leiker is here for follow-up of her chronic low back pain, hypertension, and HIV disease. Please see the A&P for the status of the pt's chronic medical problems.  Review of Systems  Constitutional: Negative for activity change, appetite change and unexpected weight change.  Respiratory: Negative for shortness of breath.   Cardiovascular: Positive for leg swelling. Negative for chest pain.       Intermittent lower extremity swelling that worsens throughout the day and resolves overnight, consistent with her chronic venous insufficiency  Gastrointestinal: Positive for constipation. Negative for abdominal pain, diarrhea and abdominal distention.       Diverticulitis pain responded to previous course of antibiotics.  Still has chronic constipation.  Musculoskeletal: Positive for back pain and arthralgias. Negative for joint swelling.  Skin: Negative for color change, rash and wound.  Neurological: Positive for headaches.       Chronic headaches unchanged.  Psychiatric/Behavioral: The patient is nervous/anxious.       Objective:   Physical Exam  Constitutional: She is oriented to person, place, and time. She appears well-developed and well-nourished. No distress.  HENT:  Head: Normocephalic and atraumatic.  Eyes: Conjunctivae are normal. Right eye exhibits no discharge. Left eye exhibits no discharge. No scleral icterus.  Cardiovascular: Normal rate, regular rhythm and normal heart sounds.  Exam reveals no gallop and no friction rub.   No murmur heard. Pulmonary/Chest: Effort normal and breath sounds normal. No respiratory distress. She has no wheezes. She has no rales.  Abdominal: Soft. Bowel sounds are normal. She exhibits no distension. There is no tenderness. There is no rebound and no guarding.  Musculoskeletal: Normal range of motion. She exhibits no  tenderness.  Neurological: She is alert and oriented to person, place, and time. She exhibits normal muscle tone.  Skin: Skin is warm and dry. No rash noted. She is not diaphoretic. No erythema.  Psychiatric: She has a normal mood and affect. Her behavior is normal. Judgment and thought content normal.  Nursing note and vitals reviewed.     Assessment & Plan:   Please see problem oriented charting.

## 2015-08-19 ENCOUNTER — Other Ambulatory Visit (HOSPITAL_COMMUNITY): Payer: Medicare Other

## 2015-08-25 ENCOUNTER — Other Ambulatory Visit (HOSPITAL_COMMUNITY): Payer: Medicare Other

## 2015-08-26 ENCOUNTER — Other Ambulatory Visit (HOSPITAL_COMMUNITY): Payer: Medicare Other

## 2015-08-26 ENCOUNTER — Ambulatory Visit (HOSPITAL_COMMUNITY)
Admission: RE | Admit: 2015-08-26 | Discharge: 2015-08-26 | Disposition: A | Discharge status: Home / Self Care | Payer: Medicare Other | Source: Ambulatory Visit | Attending: Internal Medicine | Admitting: Internal Medicine

## 2015-08-26 DIAGNOSIS — M810 Age-related osteoporosis without current pathological fracture: Secondary | ICD-10-CM | POA: Diagnosis present

## 2015-08-26 DIAGNOSIS — Z1231 Encounter for screening mammogram for malignant neoplasm of breast: Secondary | ICD-10-CM | POA: Diagnosis not present

## 2015-08-27 ENCOUNTER — Telehealth: Payer: Self-pay | Admitting: *Deleted

## 2015-08-27 NOTE — Telephone Encounter (Signed)
Received message on recorder from patient informing office that she has had both her bone density and mammogram done at [facility].Despina Hidden Cassady6/16/201711:55 AM

## 2015-09-03 ENCOUNTER — Ambulatory Visit: Payer: Medicare Other | Admitting: Internal Medicine

## 2015-09-06 ENCOUNTER — Other Ambulatory Visit: Payer: Medicare Other

## 2015-09-06 ENCOUNTER — Other Ambulatory Visit: Payer: Self-pay | Admitting: Infectious Disease

## 2015-09-06 ENCOUNTER — Other Ambulatory Visit (HOSPITAL_COMMUNITY)
Admission: RE | Admit: 2015-09-06 | Discharge: 2015-09-06 | Disposition: A | Discharge status: Home / Self Care | Payer: Medicare Other | Source: Ambulatory Visit | Attending: Infectious Disease | Admitting: Infectious Disease

## 2015-09-06 DIAGNOSIS — Z21 Asymptomatic human immunodeficiency virus [HIV] infection status: Secondary | ICD-10-CM

## 2015-09-06 DIAGNOSIS — Z113 Encounter for screening for infections with a predominantly sexual mode of transmission: Secondary | ICD-10-CM | POA: Insufficient documentation

## 2015-09-06 LAB — CBC WITH DIFFERENTIAL/PLATELET
BASOS PCT: 1 %
Basophils Absolute: 88 cells/uL (ref 0–200)
EOS ABS: 176 {cells}/uL (ref 15–500)
Eosinophils Relative: 2 %
HCT: 40.1 % (ref 35.0–45.0)
Hemoglobin: 13.7 g/dL (ref 11.7–15.5)
LYMPHS PCT: 52 %
Lymphs Abs: 4576 cells/uL — ABNORMAL HIGH (ref 850–3900)
MCH: 32.5 pg (ref 27.0–33.0)
MCHC: 34.2 g/dL (ref 32.0–36.0)
MCV: 95 fL (ref 80.0–100.0)
MONOS PCT: 3 %
MPV: 9 fL (ref 7.5–12.5)
Monocytes Absolute: 264 cells/uL (ref 200–950)
Neutro Abs: 3696 cells/uL (ref 1500–7800)
Neutrophils Relative %: 42 %
PLATELETS: 361 10*3/uL (ref 140–400)
RBC: 4.22 MIL/uL (ref 3.80–5.10)
RDW: 14.2 % (ref 11.0–15.0)
WBC: 8.8 10*3/uL (ref 3.8–10.8)

## 2015-09-06 LAB — COMPLETE METABOLIC PANEL WITH GFR
ALBUMIN: 4.5 g/dL (ref 3.6–5.1)
ALK PHOS: 49 U/L (ref 33–130)
ALT: 14 U/L (ref 6–29)
AST: 17 U/L (ref 10–35)
BUN: 10 mg/dL (ref 7–25)
CO2: 16 mmol/L — ABNORMAL LOW (ref 20–31)
Calcium: 9.8 mg/dL (ref 8.6–10.4)
Chloride: 115 mmol/L — ABNORMAL HIGH (ref 98–110)
Creat: 0.74 mg/dL (ref 0.60–0.93)
GFR, EST NON AFRICAN AMERICAN: 81 mL/min (ref >=60)
GFR, Est African American: >89 mL/min (ref >=60)
GLUCOSE: 91 mg/dL (ref 65–99)
POTASSIUM: 3.6 mmol/L (ref 3.5–5.3)
SODIUM: 141 mmol/L (ref 135–146)
Total Bilirubin: 0.4 mg/dL (ref 0.2–1.2)
Total Protein: 7.8 g/dL (ref 6.1–8.1)

## 2015-09-07 LAB — T-HELPER CELL (CD4) - (RCID CLINIC ONLY)
CD4 T CELL ABS: 1990 /uL (ref 400–2700)
CD4 T CELL HELPER: 41 % (ref 33–55)

## 2015-09-07 LAB — URINE CYTOLOGY ANCILLARY ONLY
CHLAMYDIA, DNA PROBE: NEGATIVE
Neisseria Gonorrhea: NEGATIVE
TRICH (WINDOWPATH): NEGATIVE

## 2015-09-07 LAB — HIV-1 RNA QUANT-NO REFLEX-BLD
HIV 1 RNA Quant: <20 copies/mL (ref <20)
HIV-1 RNA Quant, Log: <1.3 Log copies/mL (ref <1.30)

## 2015-09-08 ENCOUNTER — Telehealth: Payer: Self-pay

## 2015-09-08 NOTE — Telephone Encounter (Signed)
Went over [employer] and confirmed

## 2015-09-08 NOTE — Telephone Encounter (Signed)
Cyril Mourning from [facility] drug pharmacy requesting the nurse to call back regarding meds.

## 2015-09-20 ENCOUNTER — Ambulatory Visit (INDEPENDENT_AMBULATORY_CARE_PROVIDER_SITE_OTHER): Payer: Medicare Other | Admitting: Infectious Disease

## 2015-09-20 ENCOUNTER — Ambulatory Visit: Payer: Medicare Other

## 2015-09-20 ENCOUNTER — Encounter: Payer: Self-pay | Admitting: Infectious Disease

## 2015-09-20 VITALS — BP 144/76 | HR 69 | Temp 98.3°F | Ht 68.0 in | Wt 151.0 lb

## 2015-09-20 DIAGNOSIS — I10 Essential (primary) hypertension: Secondary | ICD-10-CM

## 2015-09-20 DIAGNOSIS — F4321 Adjustment disorder with depressed mood: Secondary | ICD-10-CM

## 2015-09-20 DIAGNOSIS — E785 Hyperlipidemia, unspecified: Secondary | ICD-10-CM | POA: Diagnosis not present

## 2015-09-20 DIAGNOSIS — R55 Syncope and collapse: Secondary | ICD-10-CM

## 2015-09-20 DIAGNOSIS — B20 Human immunodeficiency virus [HIV] disease: Secondary | ICD-10-CM

## 2015-09-20 HISTORY — DX: Syncope and collapse: R55

## 2015-09-20 NOTE — Progress Notes (Signed)
Chief complaint: Follow-up for HIV on medication and with hx of recent syncope and fall in late June  Subjective:    Patient ID: Jessica [name], female    DOB: 09/01/1940, 75 y.o.   MRN: [id number]  HPI   Jessica [name] is a 75 y.o. female with HIV infection who is doing superbly well on her antiviral regimen, changed from Atripla to [facility] and [place] with undetectable viral load and health cd4 count.   Lab Results  Component Value Date   HIV1RNAQUANT <20 09/06/2015   HIV1RNAQUANT <20 03/16/2015   HIV1RNAQUANT <20 12/23/2014     Lab Results  Component Value Date   CD4TABS 1990 09/06/2015   CD4TABS 2300 03/16/2015   CD4TABS 1990 12/23/2014    She recounts history of having gotten up to go to the BR to urinate with a  Full bladder but on here way she completely blacked out and hit the floor striking her head. She never sought care in ED or with PCP or with me.     Past Medical History  Diagnosis Date  . Human immunodeficiency virus disease ([place]) 03/27/1986  . Tobacco abuse 02/19/2012  . Essential hypertension 07/20/2006  . Hyperlipidemia LDL goal < 100 04/05/2012  . 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 Lumbar degenerative disc disease 07/20/2006    With chronic back pain   . 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 Small bowel obstruction due to adhesions ([place]) 02/08/2012    s/p Exploratory laparotomy, lysis of adhesions 02/12/12    . Constipation due to pain medication 04/27/2010  . Diverticulosis 02/08/2012    Extensive left-sided diverticula on colonoscopy March 2012 per Dr. Gala Romney   . Genital herpes 07/20/2006  . 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 Seborrhea 09/01/2010  . Anxiety 04/05/2012  . Bursitis of right shoulder 07/12/2012    s/p shoulder injection 07/12/2012   . Voiding dysfunction     s/p cystoscopy and meatal dilation Dec 2005  . Cataract of  right eye   . Heart murmur 1961  . Right rotator cuff tear 02/01/2013    Responds to periodic steroid injections  . Periumbilical hernia [id number]    1 cm left periumbilical abdominal wall defect  . 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  Hearing aides with possible masking of tinnitus recommended but patient wished to defer secondary to finances.  . Blood transfusion without reported diagnosis     pt denies  . Vasovagal syncope 02/15/2015  . Micturition syncope 09/20/2015    Past Surgical History  Procedure Laterality Date  . Eye surgery    . Aorto-femoral bypass graft  04/2007  . Appendectomy    . Cholecystectomy    . Colectomy  01/2011    Dr. Margot Chimes; "took out 12 inches of small intestiines and removed blockage"  . Abdominal hysterectomy    . Laparotomy  02/12/2012    Procedure: EXPLORATORY LAPAROTOMY;  Surgeon: Stark Klein, MD;  Location: [facility] OR;  Service: General;  Laterality: N/A;  Exploratory Laparotomy, lysis of adhesions  . Breast surgery      Breast biopsy: negative  . Small intestine surgery      Family History  Problem Relation Age  of Onset  . Kidney failure Mother   . Diabetes Mother   . Hypertension Mother   . Heart disease Mother   . Glaucoma Father   . Congestive Heart Failure Sister   . Diabetes Sister   . Kidney disease Sister   . Diabetes Brother   . Unexplained death Brother 61    Automobile accident  . Hypothyroidism Daughter   . Arthritis Daughter     Neck/Back  . Healthy Son   . HIV/AIDS Brother   . HIV Daughter   . Kidney disease Daughter   . Arthritis Son     Knee      Social History   Social History  . Marital Status: Widowed    Spouse Name: N/A  . Number of Children: 4  . Years of Education: 2y college   Occupational History  . retired     previously worked as a Designer, fashion/clothing for [employer] History Main Topics  . Smoking status: Current Every Day Smoker -- 0.50 packs/day for 50 years    Types: Cigarettes  . Smokeless tobacco: Never Used     Comment: 1/2 PPD  . Alcohol Use: No     Comment: "last drink of alcohol ~ 1977"  . Drug Use: No     Comment: cutting back on chantix, 1 cigarette this morning  . Sexual Activity: No   Other Topics Concern  . Not on file   Social History Narrative   Lives alone in [place], [place]    Allergies  Allergen Reactions  . Hctz [Hydrochlorothiazide] Other (See Comments)    Dizziness, syncope. Does not wish to take anymore     Current outpatient prescriptions:  .  acyclovir (ZOVIRAX) 400 MG tablet, TAKE 1 TABLET BY MOUTH THREE TIMES DAILY AS NEEDED FOR OUTBREAKS FOR 7 DAYS, Disp: 21 tablet, Rfl: 5 .  alendronate (FOSAMAX) 70 MG tablet, TAKE 1 TABLET BY MOUTH ONCE A WEEK., Disp: 12 tablet, Rfl: 3 .  ALPRAZolam (XANAX) 1 MG tablet, Take 1 tablet (1 mg total) by mouth 3 (three) times daily as needed for anxiety., Disp: 90 tablet, Rfl: 5 .  amLODipine (NORVASC) 10 MG tablet, TAKE 1 TABLET BY MOUTH DAILY., Disp: 90 tablet, Rfl: 3 .  aspirin 81 MG EC tablet, Take 81 mg by mouth daily.  , Disp: , Rfl:  .  atenolol (TENORMIN) 50 MG tablet, Take 1 tablet (50 mg total) by mouth daily., Disp: 90 tablet, Rfl: 3 .  atorvastatin (LIPITOR) 10 MG tablet, Take 1 tablet (10 mg total) by mouth daily., Disp: 30 tablet, Rfl: 11 .  benazepril (LOTENSIN) 40 MG tablet, TAKE 1 TABLET BY MOUTH EVERY DAY, Disp: 30 tablet, Rfl: 11 .  calcium-vitamin D (OSCAL WITH D) 500-200 MG-UNIT per tablet, Take 1 tablet by mouth 2 (two) times daily., Disp: , Rfl:  .  COMBIGAN 0.2-0.5 % ophthalmic solution, Place 1 drop into the left eye 2 (two) times daily., Disp: , Rfl:  .  cyclobenzaprine (FLEXERIL) 10 MG tablet, Take 1 tablet (10 mg total) by mouth 3 (three) times daily as needed for muscle spasms., Disp: 90 tablet, Rfl: 11 .  docusate sodium (COLACE) 100 MG  capsule, Take 100 mg by mouth 2 (two) times daily., Disp: , Rfl:  .  dolutegravir (TIVICAY) 50 MG tablet, Take 1 tablet (50 mg total) by mouth daily., Disp: 30 tablet, Rfl: 11 .  emtricitabine-tenofovir AF (DESCOVY) 200-25 MG tablet, Take 1 tablet by mouth daily.,  Disp: 30 tablet, Rfl: 11 .  fentaNYL (DURAGESIC) 50 MCG/HR, Place 1 patch (50 mcg total) onto the skin every 3 (three) days., Disp: 10 patch, Rfl: 0 .  HYDROcodone-acetaminophen (NORCO) 10-325 MG tablet, Take 1 tablet by mouth every 8 (eight) hours as needed for moderate pain., Disp: 75 tablet, Rfl: 0 .  ibuprofen (ADVIL,MOTRIN) 200 MG tablet, Take 400 mg by mouth 3 (three) times daily as needed for pain., Disp: , Rfl:  .  megestrol (MEGACE) 40 MG tablet, Take 1 tablet (40 mg total) by mouth 2 (two) times daily., Disp: 180 tablet, Rfl: 3 .  Multiple Vitamin (MULTIVITAMIN WITH MINERALS) TABS, Take 1 tablet by mouth daily., Disp: , Rfl:  .  pantoprazole (PROTONIX) 40 MG tablet, Take 1 tablet (40 mg total) by mouth daily., Disp: 30 tablet, Rfl: 11 .  sorbitol 70 % solution, Take 15 mLs by mouth daily as needed., Disp: 473 mL, Rfl: 0 .  spironolactone (ALDACTONE) 100 MG tablet, Take 1 tablet (100 mg total) by mouth daily., Disp: 90 tablet, Rfl: 3 .  triamcinolone ointment (KENALOG) 0.1 %, APPLY TO AFFECTED AREA TWICE DAILY, Disp: 454 g, Rfl: 3 .  magnesium hydroxide (MILK OF MAGNESIA) 800 MG/5ML suspension, Take 30 mLs by mouth daily. (Patient taking differently: Take 30 mLs by mouth daily as needed for constipation or heartburn. ), Disp: 240 mL, Rfl: 0 .  Naloxone HCl (EVZIO) 0.4 MG/0.4ML SOAJ, Inject 0.4 mg as directed once as needed. For overdose and dial 911. May give another dose if needed. (Patient not taking: Reported on 09/20/2015), Disp: 0.4 mL, Rfl: 0        Review of Systems  Constitutional: Negative for chills, diaphoresis, activity change, appetite change, fatigue and unexpected weight change.  HENT: Negative for congestion,  ear pain, rhinorrhea, sinus pressure, sneezing, sore throat and trouble swallowing.   Eyes: Negative for photophobia and visual disturbance.  Respiratory: Negative for cough, chest tightness, shortness of breath, wheezing and stridor.   Cardiovascular: Negative for palpitations and leg swelling.  Gastrointestinal: Negative for nausea, vomiting, diarrhea, constipation, blood in stool, abdominal distention and anal bleeding.  Genitourinary: Negative for hematuria, flank pain and difficulty urinating.  Musculoskeletal: Negative for myalgias, joint swelling, arthralgias and gait problem.  Skin: Negative for color change, pallor and wound.  Neurological: Negative for dizziness, tremors and light-headedness.  Hematological: Negative for adenopathy. Does not bruise/bleed easily.  Psychiatric/Behavioral: Negative for behavioral problems, confusion, sleep disturbance, dysphoric mood, decreased concentration and agitation.        Objective:   Physical Exam  Constitutional: She is oriented to person, place, and time. She appears well-developed and well-nourished. No distress.  HENT:  Head: Normocephalic and atraumatic.  Mouth/Throat: No oropharyngeal exudate.  Eyes: Conjunctivae and EOM are normal. No scleral icterus.  Neck: Normal range of motion. Neck supple.  Cardiovascular: Normal rate and regular rhythm.   Pulmonary/Chest: Effort normal. No respiratory distress. She has no wheezes.  Abdominal: She exhibits no distension.  Musculoskeletal: She exhibits no edema or tenderness.  Neurological: She is alert and oriented to person, place, and time. She exhibits normal muscle tone. Coordination normal.  Skin: Skin is warm and dry. No rash noted. She is not diaphoretic. No erythema. No pallor.  Psychiatric: She has a normal mood and affect. Her behavior is normal. Judgment and thought content normal.          Assessment & Plan:   Micturition Syncope: We checked orthostatic VS and she was not  orthostatic. I  think she had micturition syncope. She has already had several events that appear to have been mediated by vagal stimulation   HIV: continue  Tivicay and Descovy and recheck labs in 6 months, renew SPAP  HTN being managed by Dr. Eppie Gibson.   Hyperlipidemia: Continue Lipitor just to avoid potential drug drug reaction current cholesterol lowering statin.  Grief from loss of her daughter: she is coping better   I spent greater than 40 minutes with the patient including greater than 50% of time in face to face counsel of the patient and her syncope and possible causes, HIV regimenher grief loss of her daughter her hyperlipidemia hypertension,and in coordination of  her care.

## 2015-09-29 ENCOUNTER — Ambulatory Visit: Payer: Medicare Other

## 2015-10-04 ENCOUNTER — Other Ambulatory Visit: Payer: Self-pay | Admitting: Infectious Disease

## 2015-10-04 DIAGNOSIS — H401121 Primary open-angle glaucoma, left eye, mild stage: Secondary | ICD-10-CM | POA: Diagnosis not present

## 2015-10-04 DIAGNOSIS — R21 Rash and other nonspecific skin eruption: Secondary | ICD-10-CM

## 2015-10-13 ENCOUNTER — Encounter: Payer: Self-pay | Admitting: Infectious Disease

## 2015-10-26 ENCOUNTER — Other Ambulatory Visit: Payer: Self-pay | Admitting: Infectious Disease

## 2015-10-26 DIAGNOSIS — B029 Zoster without complications: Secondary | ICD-10-CM

## 2015-12-10 ENCOUNTER — Encounter: Payer: Self-pay | Admitting: Internal Medicine

## 2015-12-10 ENCOUNTER — Ambulatory Visit (INDEPENDENT_AMBULATORY_CARE_PROVIDER_SITE_OTHER): Payer: Medicare Other | Admitting: Internal Medicine

## 2015-12-10 VITALS — BP 152/60 | HR 68 | Temp 98.4°F | Wt 149.3 lb

## 2015-12-10 DIAGNOSIS — Z79818 Long term (current) use of other agents affecting estrogen receptors and estrogen levels: Secondary | ICD-10-CM

## 2015-12-10 DIAGNOSIS — M810 Age-related osteoporosis without current pathological fracture: Secondary | ICD-10-CM

## 2015-12-10 DIAGNOSIS — Z0000 Encounter for general adult medical examination without abnormal findings: Secondary | ICD-10-CM

## 2015-12-10 DIAGNOSIS — B20 Human immunodeficiency virus [HIV] disease: Secondary | ICD-10-CM

## 2015-12-10 DIAGNOSIS — I10 Essential (primary) hypertension: Secondary | ICD-10-CM | POA: Diagnosis not present

## 2015-12-10 DIAGNOSIS — F17210 Nicotine dependence, cigarettes, uncomplicated: Secondary | ICD-10-CM | POA: Diagnosis not present

## 2015-12-10 DIAGNOSIS — Z79891 Long term (current) use of opiate analgesic: Secondary | ICD-10-CM

## 2015-12-10 DIAGNOSIS — R55 Syncope and collapse: Secondary | ICD-10-CM | POA: Diagnosis not present

## 2015-12-10 DIAGNOSIS — Z21 Asymptomatic human immunodeficiency virus [HIV] infection status: Secondary | ICD-10-CM | POA: Diagnosis not present

## 2015-12-10 DIAGNOSIS — I739 Peripheral vascular disease, unspecified: Secondary | ICD-10-CM

## 2015-12-10 DIAGNOSIS — E785 Hyperlipidemia, unspecified: Secondary | ICD-10-CM

## 2015-12-10 DIAGNOSIS — F411 Generalized anxiety disorder: Secondary | ICD-10-CM

## 2015-12-10 DIAGNOSIS — Z79899 Other long term (current) drug therapy: Secondary | ICD-10-CM | POA: Diagnosis not present

## 2015-12-10 DIAGNOSIS — M5136 Other intervertebral disc degeneration, lumbar region: Secondary | ICD-10-CM | POA: Diagnosis not present

## 2015-12-10 DIAGNOSIS — A6000 Herpesviral infection of urogenital system, unspecified: Secondary | ICD-10-CM

## 2015-12-10 DIAGNOSIS — Z23 Encounter for immunization: Secondary | ICD-10-CM

## 2015-12-10 DIAGNOSIS — Z720 Tobacco use: Secondary | ICD-10-CM

## 2015-12-10 MED ORDER — FENTANYL 50 MCG/HR TD PT72: 50.0000 ug | MEDICATED_PATCH | TRANSDERMAL | 0 refills | Status: DC

## 2015-12-10 MED ORDER — MEGESTROL ACETATE 40 MG PO TABS: 40.0000 mg | ORAL_TABLET | Freq: Every day | ORAL | 0 refills | Status: DC

## 2015-12-10 MED ORDER — HYDROCODONE-ACETAMINOPHEN 10-325 MG PO TABS: 1.0000 | ORAL_TABLET | Freq: Three times a day (TID) | ORAL | 0 refills | Status: DC | PRN

## 2015-12-10 NOTE — Assessment & Plan Note (Signed)
Assessment  She is followed closely by Dr. Drucilla Schmidt at the [facility]. She is tolerating her antiretroviral regimen well. Her appetite remains decent despite our slow wean of her Megace.  Plan  She will continue her anti-retroviral therapy under the guidance of Dr. Drucilla Schmidt. We will decrease the Megace to 40 mg by mouth daily. We will reassess her symptoms at the follow-up visit, but are hopeful we can stop this medication altogether at the follow-up visit. This will limit her polypharmacy and may slightly improve her leg pressure to allow [name] to reach target.

## 2015-12-10 NOTE — Assessment & Plan Note (Signed)
Assessment  She states she's had one outbreak in the last 3 months that required her to utilize acyclovir.  Plan  We will continue the as needed acyclovir for outbreaks. Her symptoms are not frequent enough to justify chronic prophylactic therapy. We will reassess the frequency of her recurrent disease at the follow-up visit.

## 2015-12-10 NOTE — Assessment & Plan Note (Signed)
Assessment  We again discussed the importance of tobacco cessation to her overall health. I asked her if she was ready to quit. She states at this time she is not ready but is aware that she can call at any time to ask for pharmacologic therapy should she be ready. At least 3 minutes were spent discussing tobacco cessation.  Plan  We will continue to assess her readiness for tobacco cessation at each subsequent visit and offer pharmacologic therapy should she be interested.

## 2015-12-10 NOTE — Assessment & Plan Note (Signed)
Assessment  She continues to tolerate the atorvastatin 10 mg by mouth daily without myalgias.  Plan  We will continue with this moderate intensity statin that does not interact with her antiretroviral therapy. We will reassess for intolerances at the follow-up visit.

## 2015-12-10 NOTE — Assessment & Plan Note (Signed)
Assessment  Her blood pressure today was 152/60 which is above target. This is on amlodipine 10 mg by mouth daily, atenolol 50 mg by mouth daily, benazepril 30 mg by mouth daily, and spironolactone 100 mg by mouth daily.  Plan  We will continue the amlodipine, atenolol, benazepril, and spironolactone at the current doses. We are slowly weaning off the Megace and I'm hopeful this will allow [name] to reach her target blood pressure of less than 150/90. We have checked a basic metabolic panel today to assure that she does not have hyperkalemia with the recent increase in her spironolactone from 50 to 100 mg daily. The results are pending at the time of this dictation. We will reassess her blood pressure at the follow-up visit.

## 2015-12-10 NOTE — Assessment & Plan Note (Signed)
Assessment  She has had no symptoms consistent with recurrent peripheral vascular occlusive disease of the lower extremity status post her aortobifem bypass.  Plan  We will continue aggressive risk factor modification including control of her blood pressure and her hyperlipidemia. We again continue to stress the importance of tobacco cessation on her overall health.

## 2015-12-10 NOTE — Assessment & Plan Note (Signed)
Assessment  She states she remains anxious and is reliant on the Xanax that she is been getting for well over 10 years. Her current dose is 1 mg every 8 hours as needed for anxiety dispense #90 per month.  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 I will likely begin this process after achieving a Narco pill count of 60 per month. At this time, we will continue the Xanax at 1 mg every 6 hours as needed for anxiety dispense #90 per month.

## 2015-12-10 NOTE — Assessment & Plan Note (Signed)
She is currently up-to-date on her health care maintenance after receiving the flu vaccination today.

## 2015-12-10 NOTE — Progress Notes (Signed)
   Subjective:    Patient ID: Jessica [name], female    DOB: [birth year], 75 y.o.   MRN: [id number]  HPI  Jessica [name] is here for follow-up of her hypertension, chronic pain, chronic anxiety, and HIV disease. Please see the A&P for the status of the pt's chronic medical problems.  Review of Systems  Constitutional: Negative for activity change, appetite change and unexpected weight change.  Respiratory: Negative for chest tightness and shortness of breath.   Cardiovascular: Negative for chest pain and leg swelling.  Gastrointestinal: Positive for constipation. Negative for abdominal pain, diarrhea, nausea and vomiting.  Musculoskeletal: Positive for back pain and myalgias.  Neurological: Positive for weakness.       Subjective weakness in the right thigh which she states is chronic  Psychiatric/Behavioral: The patient is nervous/anxious.       Objective:   Physical Exam  Constitutional: She is oriented to person, place, and time. She appears well-developed and well-nourished. No distress.  HENT:  Head: Normocephalic and atraumatic.  Eyes: Conjunctivae are normal. Right eye exhibits no discharge. Left eye exhibits no discharge. No scleral icterus.  Cardiovascular: Normal rate, regular rhythm and normal heart sounds.  Exam reveals no gallop and no friction rub.   No murmur heard. Pulmonary/Chest: Effort normal and breath sounds normal. No respiratory distress. She has no wheezes. She has no rales.  Abdominal: Soft. Bowel sounds are normal. She exhibits no distension. There is no tenderness. There is no rebound and no guarding.  Musculoskeletal: Normal range of motion. She exhibits no edema or tenderness.  Neurological: She is alert and oriented to person, place, and time. She exhibits normal muscle tone.  Skin: Skin is warm and dry. No rash noted. She is not diaphoretic. No erythema.  Psychiatric: She has a normal mood and affect. Her behavior is normal. Judgment and  thought content normal.  Nursing note and vitals reviewed.     Assessment & Plan:   Please see problem based charting.

## 2015-12-10 NOTE — Patient Instructions (Signed)
It was good to see you again.  1) I decreased the megace to 40 mg daily.  If you do well with this we will stop it at the next visit.  2) Keep taking your other medications as you are.  3) You got the flu vaccine today.  4) I gave you the next three months of the narco and fentanyl written prescriptions.  5) I will see you back in 3 months, sooner if necessary.

## 2015-12-10 NOTE — Assessment & Plan Note (Signed)
Assessment  Her chronic low back pain is stable on her current regimen that includes fentanyl 50 g patch every 3 days, hydrocodone-acetaminophen 10-325 one tablet every 6 hours as needed for pain dispense #75 per month, and Flexeril 10 mg by mouth every 8 hours as needed for muscle spasms. With this regimen she is able to perform her activities of daily living.  Plan  We will continue the fentanyl patches and slowly continue to wean the hydrocodone-acetaminophen. I have given her 3 one month written prescriptions for each of these medications to get her through December. With the hydrocodone-acetaminophen I have decreased the number of tablets per month to 70. Ultimately, I would like to get this down to 60 tablets per month. We will reassess her pain control on the above regimen after these changes.

## 2015-12-10 NOTE — Assessment & Plan Note (Signed)
Assessment  She recently underwent a repeat DEXA scan which showed slight improvement in her bone density, although she still qualifies for a diagnosis of osteoporosis.  Plan  She will continue the alendronate at 70 mg by mouth weekly, as well as the calcium and vitamin D supplementation.

## 2015-12-10 NOTE — Assessment & Plan Note (Signed)
Assessment  I reviewed her recent episode of syncope. She feels it was related to getting up too quickly. She denies chronic dizziness and feels this was an isolated event that requires no further evaluation or adjustment in her regimen.  Plan  We will reassess for evidence of further episodes of syncope at the follow-up visit. Otherwise we are continuing with our current regimen.

## 2015-12-11 LAB — BMP8+ANION GAP
ANION GAP: 16 mmol/L (ref 10.0–18.0)
BUN/Creatinine Ratio: 19 (ref 12–28)
BUN: 17 mg/dL (ref 8–27)
CALCIUM: 9.7 mg/dL (ref 8.7–10.3)
CO2: 15 mmol/L — AB (ref 18–29)
CREATININE: 0.9 mg/dL (ref 0.57–1.00)
Chloride: 113 mmol/L — ABNORMAL HIGH (ref 96–106)
GFR calc Af Amer: 73 mL/min/{1.73_m2} (ref >59)
GFR, EST NON AFRICAN AMERICAN: 64 mL/min/{1.73_m2} (ref >59)
Glucose: 104 mg/dL — ABNORMAL HIGH (ref 65–99)
Potassium: 3.8 mmol/L (ref 3.5–5.2)
SODIUM: 144 mmol/L (ref 134–144)

## 2015-12-15 NOTE — Progress Notes (Signed)
Patient ID: Jessica [name], female   DOB: 1940/11/17, 75 y.o.   MRN: [id number]  BMP: K 3.8, Cl 113, HCO3 15 AG 16, BUN 17, Cr 0.90, eGFR 73  No hyperkalemia on the higher dose of spironolactone.  BMP otherwise stable.  We will continue with current therapy.

## 2015-12-22 ENCOUNTER — Telehealth: Payer: Self-pay | Admitting: Internal Medicine

## 2015-12-22 DIAGNOSIS — I10 Essential (primary) hypertension: Secondary | ICD-10-CM

## 2015-12-23 NOTE — Telephone Encounter (Signed)
Received call from pharmacy- atenolol is on a national backorder and pt's is due for a refill.  Pharmacy would like to know if there's an alternative you would like to use in the mean time?  Will forward info to pcp for review, please advise.Regenia Skeeter, Manan Olmo Cassady10/12/201710:01 AM

## 2015-12-24 MED ORDER — METOPROLOL TARTRATE 50 MG PO TABS: 50.0000 mg | ORAL_TABLET | Freq: Two times a day (BID) | ORAL | 3 refills | Status: DC

## 2015-12-27 NOTE — Telephone Encounter (Signed)
  I converted her to metoprolol 50 mg twice daily and sent the prescription. Please call her to let her know that atenolol is not currently available and I changed her to a similar medication which needs to be taken twice a day rather than once a day  Pt aware.Despina Hidden Cassady10/16/20173:22 PM

## 2016-01-03 ENCOUNTER — Other Ambulatory Visit: Payer: Self-pay | Admitting: Infectious Disease

## 2016-01-03 ENCOUNTER — Other Ambulatory Visit: Payer: Self-pay | Admitting: Internal Medicine

## 2016-01-03 DIAGNOSIS — F411 Generalized anxiety disorder: Secondary | ICD-10-CM

## 2016-01-06 NOTE — Telephone Encounter (Signed)
Alprazolam rx called to [facility].

## 2016-01-31 ENCOUNTER — Other Ambulatory Visit: Payer: Self-pay | Admitting: Infectious Disease

## 2016-01-31 DIAGNOSIS — E785 Hyperlipidemia, unspecified: Secondary | ICD-10-CM

## 2016-01-31 DIAGNOSIS — R21 Rash and other nonspecific skin eruption: Secondary | ICD-10-CM

## 2016-02-18 ENCOUNTER — Other Ambulatory Visit: Payer: Self-pay | Admitting: Infectious Disease

## 2016-02-18 DIAGNOSIS — B20 Human immunodeficiency virus [HIV] disease: Secondary | ICD-10-CM

## 2016-02-18 DIAGNOSIS — B029 Zoster without complications: Secondary | ICD-10-CM

## 2016-02-18 MED ORDER — EMTRICITABINE-TENOFOVIR AF 200-25 MG PO TABS: 1.0000 | ORAL_TABLET | Freq: Every day | ORAL | 11 refills | Status: DC

## 2016-03-01 ENCOUNTER — Other Ambulatory Visit (HOSPITAL_COMMUNITY)
Admission: RE | Admit: 2016-03-01 | Discharge: 2016-03-01 | Disposition: A | Discharge status: Home / Self Care | Payer: Medicare Other | Source: Ambulatory Visit | Attending: Infectious Disease | Admitting: Infectious Disease

## 2016-03-01 ENCOUNTER — Other Ambulatory Visit: Payer: Self-pay | Admitting: Infectious Disease

## 2016-03-01 ENCOUNTER — Other Ambulatory Visit: Payer: Medicare Other

## 2016-03-01 DIAGNOSIS — Z113 Encounter for screening for infections with a predominantly sexual mode of transmission: Secondary | ICD-10-CM | POA: Diagnosis not present

## 2016-03-01 DIAGNOSIS — B20 Human immunodeficiency virus [HIV] disease: Secondary | ICD-10-CM | POA: Diagnosis not present

## 2016-03-01 DIAGNOSIS — K219 Gastro-esophageal reflux disease without esophagitis: Secondary | ICD-10-CM

## 2016-03-01 LAB — CBC WITH DIFFERENTIAL/PLATELET
Basophils Absolute: 0 cells/uL (ref 0–200)
Basophils Relative: 0 %
EOS ABS: 105 {cells}/uL (ref 15–500)
Eosinophils Relative: 1 %
HEMATOCRIT: 34.5 % — AB (ref 35.0–45.0)
HEMOGLOBIN: 11.5 g/dL — AB (ref 11.7–15.5)
LYMPHS ABS: 5145 {cells}/uL — AB (ref 850–3900)
LYMPHS PCT: 49 %
MCH: 32.4 pg (ref 27.0–33.0)
MCHC: 33.3 g/dL (ref 32.0–36.0)
MCV: 97.2 fL (ref 80.0–100.0)
MONO ABS: 420 {cells}/uL (ref 200–950)
MPV: 9.1 fL (ref 7.5–12.5)
Monocytes Relative: 4 %
Neutro Abs: 4830 cells/uL (ref 1500–7800)
Neutrophils Relative %: 46 %
Platelets: 371 10*3/uL (ref 140–400)
RBC: 3.55 MIL/uL — ABNORMAL LOW (ref 3.80–5.10)
RDW: 13.6 % (ref 11.0–15.0)
WBC: 10.5 10*3/uL (ref 3.8–10.8)

## 2016-03-01 LAB — COMPLETE METABOLIC PANEL WITH GFR
ALBUMIN: 3.6 g/dL (ref 3.6–5.1)
ALT: 13 U/L (ref 6–29)
AST: 20 U/L (ref 10–35)
Alkaline Phosphatase: 46 U/L (ref 33–130)
BILIRUBIN TOTAL: 0.4 mg/dL (ref 0.2–1.2)
BUN: 14 mg/dL (ref 7–25)
CALCIUM: 9.3 mg/dL (ref 8.6–10.4)
CO2: 18 mmol/L — ABNORMAL LOW (ref 20–31)
CREATININE: 1.15 mg/dL — AB (ref 0.60–0.93)
Chloride: 111 mmol/L — ABNORMAL HIGH (ref 98–110)
GFR, EST AFRICAN AMERICAN: 55 mL/min — AB (ref >=60)
GFR, Est Non African American: 47 mL/min — ABNORMAL LOW (ref >=60)
Glucose, Bld: 88 mg/dL (ref 65–99)
Potassium: 4.2 mmol/L (ref 3.5–5.3)
Sodium: 139 mmol/L (ref 135–146)
TOTAL PROTEIN: 7 g/dL (ref 6.1–8.1)

## 2016-03-02 LAB — RPR

## 2016-03-02 LAB — T-HELPER CELL (CD4) - (RCID CLINIC ONLY)
CD4 T CELL ABS: 2060 /uL (ref 400–2700)
CD4 T CELL HELPER: 38 % (ref 33–55)

## 2016-03-02 LAB — URINE CYTOLOGY ANCILLARY ONLY
CHLAMYDIA, DNA PROBE: NEGATIVE
Neisseria Gonorrhea: NEGATIVE

## 2016-03-03 LAB — HIV-1 RNA QUANT-NO REFLEX-BLD: HIV-1 RNA Quant, Log: <1.3 Log copies/mL (ref <1.30)

## 2016-03-15 ENCOUNTER — Ambulatory Visit (INDEPENDENT_AMBULATORY_CARE_PROVIDER_SITE_OTHER): Payer: Medicare HMO | Admitting: Infectious Disease

## 2016-03-15 ENCOUNTER — Encounter: Payer: Self-pay | Admitting: Infectious Disease

## 2016-03-15 VITALS — BP 159/73 | HR 76 | Temp 97.8°F | Ht 68.0 in | Wt 142.0 lb

## 2016-03-15 DIAGNOSIS — R55 Syncope and collapse: Secondary | ICD-10-CM | POA: Diagnosis not present

## 2016-03-15 DIAGNOSIS — I739 Peripheral vascular disease, unspecified: Secondary | ICD-10-CM | POA: Diagnosis not present

## 2016-03-15 DIAGNOSIS — R69 Illness, unspecified: Secondary | ICD-10-CM | POA: Diagnosis not present

## 2016-03-15 DIAGNOSIS — B20 Human immunodeficiency virus [HIV] disease: Secondary | ICD-10-CM | POA: Diagnosis not present

## 2016-03-15 DIAGNOSIS — Z720 Tobacco use: Secondary | ICD-10-CM

## 2016-03-15 DIAGNOSIS — M5136 Other intervertebral disc degeneration, lumbar region: Secondary | ICD-10-CM

## 2016-03-15 NOTE — Progress Notes (Signed)
Chief complaint: Follow-up for HIV on medication and with hx of dizziness yet again and near syncope after getting up to urinate. Note she had the exact same chief complaint the last time I saw her was again accompanied by her daughter  Subjective:    Patient ID: Jessica [name], female    DOB: 12-Feb-1941, 76 y.o.   MRN: [id number]  HPI  Becki Cornely is a 76 y.o. female with HIV infection who is doing superbly well on her antiviral regimen, changed from Atripla to [facility] with undetectable viral load and health cd4 count.   Lab Results  Component Value Date   HIV1RNAQUANT <20 03/01/2016   HIV1RNAQUANT <20 09/06/2015   HIV1RNAQUANT <20 03/16/2015     Lab Results  Component Value Date   CD4TABS 2,060 03/01/2016   CD4TABS 1,990 09/06/2015   CD4TABS 2,300 03/16/2015    She recounts Essentially identical history that she gave me the last time I saw her of a history of hearing lightheadedness and falling after having had a full bladder and try to get up to go to the restroom before she urinated on herself. At her last visit she mentioned that she asked she blacked out. We checked orthostatics on her last visit and she was not orthostatic. I recommend that she take time to go the bathroom frequently and avoid her bladder becoming full as I think she is suffering from micturition syncope.       Past Medical History:  Diagnosis Date  . Anxiety 04/05/2012  . Bilateral sensorineural hearing loss 06/11/2014   Mild to moderate on the left side and slight to mild on the right side per audiometry 05/2014.  Hearing aides with possible masking of tinnitus recommended but patient wished to defer secondary to finances.  . Blood transfusion without reported diagnosis    pt denies  . Bursitis of right shoulder 07/12/2012   s/p shoulder injection 07/12/2012   . Cataract of right eye   . Constipation due to pain medication 04/27/2010  . Diverticulosis 02/08/2012   Extensive  left-sided diverticula on colonoscopy March 2012 per Dr. Gala Romney   . Essential hypertension 07/20/2006  . Genital herpes 07/20/2006  . 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 Heart murmur 1961  . Human immunodeficiency virus disease ([place]) 03/27/1986  . Hyperlipidemia LDL goal < 100 04/05/2012  . Lumbar degenerative disc disease 07/20/2006   With chronic back pain   . Micturition syncope 09/20/2015  . 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[place]) 11/01/2011   s/p aortobifem bypass 2009   . Periumbilical hernia 123XX123   1 cm left periumbilical abdominal wall defect  . 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 Right rotator cuff tear 02/01/2013   Responds to periodic steroid injections  . Seborrhea 09/01/2010  . Small bowel obstruction due to adhesions 02/08/2012   s/p Exploratory laparotomy, lysis of adhesions 02/12/12    . 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 Tobacco abuse 02/19/2012  . Vasovagal syncope 02/15/2015  . Voiding dysfunction    s/p cystoscopy and meatal dilation Dec 2005    Past Surgical History:  Procedure Laterality Date  . ABDOMINAL HYSTERECTOMY    . AORTO-FEMORAL BYPASS GRAFT  04/2007  . APPENDECTOMY    . BREAST SURGERY     Breast biopsy: negative  . CHOLECYSTECTOMY    . COLECTOMY  01/2011   Dr. Margot Chimes; "took out 12 inches of  small intestiines and removed blockage"  . EYE SURGERY    . LAPAROTOMY  02/12/2012   Procedure: EXPLORATORY LAPAROTOMY;  Surgeon: Stark Klein, MD;  Location: [facility] OR;  Service: General;  Laterality: N/A;  Exploratory Laparotomy, lysis of adhesions  . SMALL INTESTINE SURGERY      Family History  Problem Relation Age of Onset  . Kidney failure Mother   . Diabetes Mother   . Hypertension Mother   . Heart disease Mother   . Glaucoma Father   . Congestive Heart Failure Sister   . Diabetes Sister   . Kidney disease Sister   . Diabetes Brother   . Unexplained death Brother 81    Automobile  accident  . Hypothyroidism Daughter   . Arthritis Daughter     Neck/Back  . Healthy Son   . HIV/AIDS Brother   . HIV Daughter   . Kidney disease Daughter   . Arthritis Son     Knee      Social History   Social History  . Marital status: Widowed    Spouse name: N/A  . Number of children: 4  . Years of education: 2y college   Occupational History  . 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 Smoking status: Current Every Day Smoker    Packs/day: 0.50    Years: 50.00    Types: Cigarettes  . Smokeless tobacco: Never Used     Comment: 1/2 PPD  . Alcohol use No     Comment: "last drink of alcohol ~ 1977"  . Drug use: No     Comment: cutting back on chantix, 1 cigarette this morning  . Sexual activity: No   Other Topics Concern  . Not on file   Social History Narrative   Lives alone in [place], [place]    Allergies  Allergen Reactions  . Hctz [Hydrochlorothiazide] Other (See Comments)    Dizziness, syncope. Does not wish to take anymore     Current Outpatient Prescriptions:  .  acyclovir (ZOVIRAX) 400 MG tablet, TAKE 1 TABLET BY MOUTH THREE TIMES DAILY AS NEEDED FOR OUTBREAKS FOR 7 DAYS, Disp: 21 tablet, Rfl: 5 .  alendronate (FOSAMAX) 70 MG tablet, TAKE 1 TABLET BY MOUTH ONCE A WEEK., Disp: 12 tablet, Rfl: 3 .  ALPRAZolam (XANAX) 1 MG tablet, Take 1 tablet (1 mg total) by mouth 3 (three) times daily as needed for anxiety., Disp: 90 tablet, Rfl: 5 .  amLODipine (NORVASC) 10 MG tablet, TAKE 1 TABLET BY MOUTH DAILY., Disp: 90 tablet, Rfl: 3 .  aspirin 81 MG EC tablet, Take 81 mg by mouth daily.  , Disp: , Rfl:  .  atorvastatin (LIPITOR) 10 MG tablet, TAKE 1 TABLET BY MOUTH EVERY DAY (STOP LOVASTATIN), Disp: 30 tablet, Rfl: 11 .  benazepril (LOTENSIN) 40 MG tablet, TAKE 1 TABLET BY MOUTH EVERY DAY, Disp: 30 tablet, Rfl: 5 .  calcium-vitamin D (OSCAL WITH D) 500-200 MG-UNIT per tablet, Take 1 tablet by mouth 2 (two) times  daily., Disp: , Rfl:  .  COMBIGAN 0.2-0.5 % ophthalmic solution, Place 1 drop into the left eye 2 (two) times daily., Disp: , Rfl:  .  cyclobenzaprine (FLEXERIL) 10 MG tablet, Take 1 tablet (10 mg total) by mouth 3 (three) times daily as needed for muscle spasms., Disp: 90 tablet, Rfl: 11 .  docusate sodium (COLACE) 100 MG capsule, Take 100 mg by mouth 2 (two)  times daily., Disp: , Rfl:  .  emtricitabine-tenofovir AF (DESCOVY) 200-25 MG tablet, Take 1 tablet by mouth daily., Disp: 30 tablet, Rfl: 11 .  fentaNYL (DURAGESIC) 50 MCG/HR, Place 1 patch (50 mcg total) onto the skin every 3 (three) days., Disp: 10 patch, Rfl: 0 .  HYDROcodone-acetaminophen (NORCO) 10-325 MG tablet, Take 1 tablet by mouth every 8 (eight) hours as needed for moderate pain., Disp: 70 tablet, Rfl: 0 .  ibuprofen (ADVIL,MOTRIN) 200 MG tablet, Take 400 mg by mouth 3 (three) times daily as needed for pain., Disp: , Rfl:  .  magnesium hydroxide (MILK OF MAGNESIA) 800 MG/5ML suspension, Take 30 mLs by mouth daily. (Patient taking differently: Take 30 mLs by mouth daily as needed for constipation or heartburn. ), Disp: 240 mL, Rfl: 0 .  megestrol (MEGACE) 40 MG tablet, Take 1 tablet (40 mg total) by mouth daily., Disp: 90 tablet, Rfl: 0 .  metoprolol (LOPRESSOR) 50 MG tablet, Take 1 tablet (50 mg total) by mouth 2 (two) times daily., Disp: 180 tablet, Rfl: 3 .  Multiple Vitamin (MULTIVITAMIN WITH MINERALS) TABS, Take 1 tablet by mouth daily., Disp: , Rfl:  .  Naloxone HCl (EVZIO) 0.4 MG/0.4ML SOAJ, Inject 0.4 mg as directed once as needed. For overdose and dial 911. May give another dose if needed. (Patient not taking: Reported on 09/20/2015), Disp: 0.4 mL, Rfl: 0 .  pantoprazole (PROTONIX) 40 MG tablet, TAKE 1 TABLET BY MOUTH EVERY DAY, Disp: 30 tablet, Rfl: 11 .  sorbitol 70 % solution, Take 15 mLs by mouth daily as needed., Disp: 473 mL, Rfl: 0 .  spironolactone (ALDACTONE) 100 MG tablet, Take 1 tablet (100 mg total) by mouth  daily., Disp: 90 tablet, Rfl: 3 .  TIVICAY 50 MG tablet, TAKE 1 TABLET BY MOUTH EVERY DAY, Disp: 30 tablet, Rfl: 11 .  triamcinolone ointment (KENALOG) 0.1 %, APPLY TO AFFECTED AREA TWICE DAILY, Disp: 454 g, Rfl: 3        Review of Systems  Constitutional: Negative for activity change, appetite change, chills, diaphoresis, fatigue and unexpected weight change.  HENT: Negative for congestion, ear pain, rhinorrhea, sinus pressure, sneezing, sore throat and trouble swallowing.   Eyes: Negative for photophobia and visual disturbance.  Respiratory: Negative for cough, chest tightness, shortness of breath, wheezing and stridor.   Cardiovascular: Negative for palpitations and leg swelling.  Gastrointestinal: Negative for abdominal distention, anal bleeding, blood in stool, constipation, diarrhea, nausea and vomiting.  Genitourinary: Negative for difficulty urinating, flank pain and hematuria.  Musculoskeletal: Negative for arthralgias, gait problem, joint swelling and myalgias.  Skin: Negative for color change, pallor and wound.  Neurological: Negative for dizziness, tremors and light-headedness.  Hematological: Negative for adenopathy. Does not bruise/bleed easily.  Psychiatric/Behavioral: Negative for agitation, behavioral problems, confusion, decreased concentration, dysphoric mood and sleep disturbance.        Objective:   Physical Exam  Constitutional: She is oriented to person, place, and time. She appears well-developed and well-nourished. No distress.  HENT:  Head: Normocephalic and atraumatic.  Mouth/Throat: No oropharyngeal exudate.  Eyes: Conjunctivae and EOM are normal. No scleral icterus.  Neck: Normal range of motion. Neck supple.  Cardiovascular: Normal rate and regular rhythm.   Pulmonary/Chest: Effort normal. No respiratory distress. She has no wheezes.  Abdominal: She exhibits no distension.  Musculoskeletal: She exhibits no edema or tenderness.  Neurological: She is  alert and oriented to person, place, and time. She exhibits normal muscle tone. Coordination normal.  Skin: Skin is warm  and dry. No rash noted. She is not diaphoretic. No erythema. No pallor.  Psychiatric: She has a normal mood and affect. Her behavior is normal. Judgment and thought content normal.          Assessment & Plan:   Micturition Syncope: Hopefully she will heed the advice that I gave her 6 months ago and will stop holding her urine for such protracted periods of time and putting her at risk for falling and potentially even suffering something such as a fractured hip.   HIV: continue  Tivicay and Descovy and recheck labs in 12 months, renew SPAP every 6 months  HTN being managed by Dr. Eppie Gibson.   Hyperlipidemia: Continue Lipitor just to avoid potential drug drug reaction current cholesterol lowering statin.  Grief from loss of her daughter: she is coping better   I spent greater than 25 of the patient and her syncope and possible causes, HIV regimen hypertension,and in coordination of  her care.

## 2016-03-17 ENCOUNTER — Ambulatory Visit (INDEPENDENT_AMBULATORY_CARE_PROVIDER_SITE_OTHER): Payer: Medicare HMO | Admitting: Internal Medicine

## 2016-03-17 ENCOUNTER — Encounter: Payer: Self-pay | Admitting: Internal Medicine

## 2016-03-17 VITALS — BP 124/79 | HR 68 | Temp 97.9°F | Wt 143.5 lb

## 2016-03-17 DIAGNOSIS — Z21 Asymptomatic human immunodeficiency virus [HIV] infection status: Secondary | ICD-10-CM

## 2016-03-17 DIAGNOSIS — R69 Illness, unspecified: Secondary | ICD-10-CM | POA: Diagnosis not present

## 2016-03-17 DIAGNOSIS — F17210 Nicotine dependence, cigarettes, uncomplicated: Secondary | ICD-10-CM | POA: Diagnosis not present

## 2016-03-17 DIAGNOSIS — I739 Peripheral vascular disease, unspecified: Secondary | ICD-10-CM

## 2016-03-17 DIAGNOSIS — J3489 Other specified disorders of nose and nasal sinuses: Secondary | ICD-10-CM

## 2016-03-17 DIAGNOSIS — B20 Human immunodeficiency virus [HIV] disease: Secondary | ICD-10-CM

## 2016-03-17 DIAGNOSIS — M5136 Other intervertebral disc degeneration, lumbar region: Secondary | ICD-10-CM | POA: Diagnosis not present

## 2016-03-17 DIAGNOSIS — I10 Essential (primary) hypertension: Secondary | ICD-10-CM

## 2016-03-17 DIAGNOSIS — R55 Syncope and collapse: Secondary | ICD-10-CM

## 2016-03-17 DIAGNOSIS — Z79899 Other long term (current) drug therapy: Secondary | ICD-10-CM

## 2016-03-17 DIAGNOSIS — J00 Acute nasopharyngitis [common cold]: Secondary | ICD-10-CM

## 2016-03-17 DIAGNOSIS — M75111 Incomplete rotator cuff tear or rupture of right shoulder, not specified as traumatic: Secondary | ICD-10-CM

## 2016-03-17 DIAGNOSIS — Z8679 Personal history of other diseases of the circulatory system: Secondary | ICD-10-CM

## 2016-03-17 DIAGNOSIS — M25511 Pain in right shoulder: Secondary | ICD-10-CM

## 2016-03-17 DIAGNOSIS — Z79891 Long term (current) use of opiate analgesic: Secondary | ICD-10-CM | POA: Diagnosis not present

## 2016-03-17 DIAGNOSIS — E785 Hyperlipidemia, unspecified: Secondary | ICD-10-CM

## 2016-03-17 DIAGNOSIS — Z8739 Personal history of other diseases of the musculoskeletal system and connective tissue: Secondary | ICD-10-CM

## 2016-03-17 DIAGNOSIS — F411 Generalized anxiety disorder: Secondary | ICD-10-CM

## 2016-03-17 DIAGNOSIS — Z720 Tobacco use: Secondary | ICD-10-CM

## 2016-03-17 DIAGNOSIS — J069 Acute upper respiratory infection, unspecified: Secondary | ICD-10-CM | POA: Insufficient documentation

## 2016-03-17 DIAGNOSIS — E7849 Other hyperlipidemia: Secondary | ICD-10-CM

## 2016-03-17 HISTORY — DX: Long term (current) use of opiate analgesic: Z79.891

## 2016-03-17 MED ORDER — HYDROCODONE-ACETAMINOPHEN 10-325 MG PO TABS: 1.0000 | ORAL_TABLET | Freq: Three times a day (TID) | ORAL | 0 refills | Status: DC | PRN

## 2016-03-17 MED ORDER — CHLORPHENIRAMINE MALEATE 4 MG PO TABS: 4.0000 mg | ORAL_TABLET | Freq: Two times a day (BID) | ORAL | 0 refills | Status: DC | PRN

## 2016-03-17 MED ORDER — FENTANYL 50 MCG/HR TD PT72: 50.0000 ug | MEDICATED_PATCH | TRANSDERMAL | 0 refills | Status: DC

## 2016-03-17 NOTE — Assessment & Plan Note (Signed)
Assessment  Her anxiety is well controlled on her Ativan 1 mg by mouth every 8 hours as needed. Ideally, I would like to slowly wean this dose and hope to do so once we are able to wean the Percocet off, or at least decrease to a reasonable count per month.  Plan  Continue the Ativan 1 mg by mouth every 8 hours as needed for anxiety. We will reassess efficacy of this regimen and look for any possible side effects at the follow-up visit.

## 2016-03-17 NOTE — Assessment & Plan Note (Signed)
Assessment  She notes some mild discomfort in the right shoulder consistent with her previous right rotator cuff tear pain. She does not feel it is significant enough to require a steroid injection at this time.  Plan  She was asked to call the clinic or make another appointment if this pain were to progress to the point where she felt she may benefit from a another steroid injection into the right shoulder. Of note, she responds well to steroid injections.

## 2016-03-17 NOTE — Assessment & Plan Note (Signed)
Assessment  She continues to smoke and is not mentally ready to quit at this time. Therefore, she remains in the pre-contemplative stage. We had the usual discussion for at least 3 minutes about the importance of smoking cessation given her chronic medical conditions.  Plan  We will reassess her readiness for smoking cessation at the follow-up visit to provide any pharmacologic aids that she may be interested in at that time.

## 2016-03-17 NOTE — Assessment & Plan Note (Signed)
Assessment  She is tolerating the atorvastatin 10 mg by mouth daily without myalgias.  Plan  We will continue this moderate intensity statin and reassess for intolerances at the follow-up visit. Given her antiretroviral therapy and potential interactions she is not a candidate for a change in her statin therapy or an escalation in the dose.

## 2016-03-17 NOTE — Assessment & Plan Note (Signed)
Assessment  Her back pain is symptomatically well controlled in allowing her to function on the following regimen; fentanyl 50 g per hour apply topically every 3 days, hydrocodone-acetaminophen 10-325 mg 1 tablet every 6 hours as needed dispense #70 per month, Flexeril 10 mg by mouth every 8 hours as needed for muscle spasm, and ibuprofen 400 mg by mouth every 8 hours as needed for pain.  Plan  We will continue the fentanyl, Flexeril, and ibuprofen at the current doses. We are continuing the slow wean of the hydrocodone-acetaminophen 10-325 mg and will provide her with #65 per month. She was given 3 prescriptions to get her through March. We will reassess the efficacy of this regimen in controlling her symptoms and allowing her to remain functional at the follow-up visit.

## 2016-03-17 NOTE — Assessment & Plan Note (Signed)
Assessment  She continues to deny any claudication symptoms with ambulation or changes in her skin in the lower extremity that would suggest an exacerbation or progression of her peripheral vascular occlusive disease.  Plan  We are continuing aggressive risk factor modification with control of her blood pressure and use of a moderate intensity statin. Unfortunately, she continues to smoke and is not ready to stop at this time. We will reassess for symptomatic peripheral vascular occlusive disease at the follow-up visit.

## 2016-03-17 NOTE — Assessment & Plan Note (Signed)
Assessment  Several weeks ago she had another episode of syncope after standing up from urination. This was is again felt to be likely micturition syncope. Dr. Tommy Medal advised her to urinate more frequently and she is trying to do so. She has not had any is episodes of micturition syncope since.  Plan  We will try to have timed urination to decrease the bouts of micturition syncope. She will also get up slowly from the toilet after urinating. We will reassess for further episodes of micturition syncope at the follow-up visit. We will consider other etiologies if this were to continue despite timed voiding.

## 2016-03-17 NOTE — Patient Instructions (Signed)
It was great to see you again.  You are doing wonderfully!  1) Keep taking the medications as you are.  2) We started chlorpheniramine 4 mg every 12 hours as needed for runny nose.  3) I gave you prescriptions for pain medication to get you through March.  I will see you in 3 months, sooner if necessary.

## 2016-03-17 NOTE — Assessment & Plan Note (Signed)
Assessment  She has noted marked rhinorrhea that is clear but associated with some soreness in the right nostril. She has had rare myalgias but currently is without any. I am concerned these symptoms may be consistent with a mild common cold or viral upper respiratory tract infection. She finds the rhinorrhea to be symptomatically disturbing.  Plan  We will start a one-week course of chlorpheniramine 4 mg by mouth every 12 hours as needed for rhinorrhea. I realize this may slightly increase her blood pressure but given the severity of her symptoms on the quality of her life a slight increase in blood pressure is not unreasonable for one week if she is symptomatically improved with this therapy. We will reassess for evidence of a common cold or persistent rhinorrhea at the follow-up visit.

## 2016-03-17 NOTE — Assessment & Plan Note (Signed)
Assessment  Her blood pressure control at this visit is excellent. She is well within target at 124/79. This is on amlodipine 10 mg by mouth daily, benazepril 40 mg by mouth daily, spironolactone 100 mg by mouth daily, and metoprolol 50 mg by mouth twice daily.  Plan  We will continue the amlodipine, benazepril, spironolactone, and metoprolol at the current doses and reassess the blood pressure at follow-up visit.

## 2016-03-17 NOTE — Assessment & Plan Note (Signed)
Assessment  She is compliant with her anti-retroviral therapy as prescribed by Dr. Tommy Medal. Her viral load remains undetectable and her CD4 count is outstanding. She is tolerating this regimen well.  Plan  We will continue the antiretroviral therapy as prescribed by Dr. Tommy Medal. She will continue to follow in the [facility] on a yearly basis.

## 2016-03-17 NOTE — Progress Notes (Signed)
   Subjective:    Patient ID: Jessica [name], female    DOB: [birth year], 76 y.o.   MRN: [id number]  HPI  Jessica [name] is here for follow-up of chronic pain, hypertension, and presumed micturition syncope. Please see the A&P for the status of the pt's chronic medical problems.  Review of Systems  Constitutional: Negative for activity change, appetite change and unexpected weight change.  HENT: Positive for postnasal drip and rhinorrhea. Negative for sinus pain and sinus pressure.   Respiratory: Negative for chest tightness, shortness of breath and wheezing.   Cardiovascular: Negative for chest pain, palpitations and leg swelling.  Gastrointestinal: Positive for abdominal pain, constipation, nausea and vomiting. Negative for abdominal distention and diarrhea.       Had 1 bout of nausea, vomiting, and abdominal pain a few days ago after eating barbeque.  She vomited X 1 and the symptoms resolved.  Her daughter is pretty convinced the barbeque sandwich was the culprit.  Musculoskeletal: Positive for arthralgias. Negative for joint swelling and myalgias.       Mild right shoulder pain lately.  She does not feel she is at the point she requires a right shoulder injection.  Skin: Negative for color change, rash and wound.  Neurological: Positive for syncope.       Episode of syncope after micturition several weeks ago.  Psychiatric/Behavioral: The patient is nervous/anxious.        Anxiety is well controlled on the as needed ativan      Objective:   Physical Exam  Constitutional: She is oriented to person, place, and time. She appears well-developed and well-nourished. No distress.  HENT:  Head: Normocephalic and atraumatic.  Eyes: Conjunctivae are normal. Right eye exhibits no discharge. Left eye exhibits no discharge. No scleral icterus.  Cardiovascular: Normal rate, regular rhythm and normal heart sounds.  Exam reveals no gallop and no friction rub.   No murmur  heard. Pulmonary/Chest: Effort normal and breath sounds normal. No respiratory distress. She has no wheezes. She has no rales.  Abdominal: Soft. She exhibits no distension. There is no tenderness. There is no rebound and no guarding.  Musculoskeletal: Normal range of motion. She exhibits no edema, tenderness or deformity.  Neurological: She is alert and oriented to person, place, and time. She exhibits normal muscle tone.  Skin: Skin is warm and dry. No rash noted. She is not diaphoretic. No erythema.  Psychiatric: She has a normal mood and affect. Her behavior is normal. Judgment and thought content normal.  Nursing note and vitals reviewed.     Assessment & Plan:   Please see problem oriented charting.

## 2016-03-17 NOTE — Assessment & Plan Note (Signed)
She is up-to-date on her health care maintenance. 

## 2016-03-24 LAB — TOXASSURE SELECT,+ANTIDEPR,UR

## 2016-04-04 NOTE — Progress Notes (Signed)
Patient ID: Jessica [name], female   DOB: 07-Dec-1940, 76 y.o.   MRN: [id number]  UDS revealed an unanticipated result:  No fentanyl was detectable despite her reporting placement of the patch.  I tried on multiple occasions over multiple days to get a hold of her via the phone but never received an answer or a voice mail.  I will discuss this result at the next visit, but when she is due for renewal of her Fentanyl I will begin a taper of the medication given my concerns for intermittent compliance.  We have been weaning the hydrocodone-acetaminophen 10-325 mg with a goal of #60/month.  We are currently at #65/month and will hold at this point while we wean off the Fentanyl.  For the April prescription (she already has prescriptions through March) she will be prescribed Fenantyl patches 25 mcg/hr for three months, then 12.5 mcg/hr X 3 months, then off.

## 2016-04-17 ENCOUNTER — Telehealth: Payer: Self-pay | Admitting: *Deleted

## 2016-04-17 NOTE — Telephone Encounter (Signed)
Please schedule her in [facility] when an attending capable of shoulder injection is available.  Thank You.

## 2016-04-17 NOTE — Telephone Encounter (Signed)
Per patient's dtr- patient is ready to come in for her yearly cortisone shot for her shoulder. Advised her she will be called to set up appt based on physician's schedule/availability. Thanks!

## 2016-04-18 ENCOUNTER — Ambulatory Visit (INDEPENDENT_AMBULATORY_CARE_PROVIDER_SITE_OTHER): Payer: Medicare HMO | Admitting: Internal Medicine

## 2016-04-18 ENCOUNTER — Encounter: Payer: Self-pay | Admitting: Internal Medicine

## 2016-04-18 VITALS — BP 153/63 | HR 63 | Temp 97.3°F | Wt 140.0 lb

## 2016-04-18 DIAGNOSIS — M7551 Bursitis of right shoulder: Secondary | ICD-10-CM

## 2016-04-18 MED ORDER — TRIAMCINOLONE ACETONIDE 40 MG/ML IJ SUSP: 40.0000 mg | Freq: Once | INTRAMUSCULAR | Status: DC

## 2016-04-18 MED ORDER — LIDOCAINE HCL (PF) 1 % IJ SOLN: 1.0000 mL | Freq: Once | INTRAMUSCULAR | Status: DC

## 2016-04-18 NOTE — Progress Notes (Signed)
    CC: shoulder pain  HPI: [name] is a 76 y.o. female with PMHx of HIV, Lumbar DDD, Osteoporosis who presents to the clinic for acute on chronic right shoulder pain.  Patient admits to a history of right shoulder subacromial bursitis for which she has received steroid injections in the past. Her last injection was over 6 months ago and they typically last for 2 months. She denies any recent injury of falls. Her right shoulder hurts worst with movement, raising her arm up or laying on her right side. She denies any recent illness, fever or chills.   Past Medical History:  Diagnosis Date  . Anxiety 04/05/2012  . Bilateral sensorineural hearing loss 06/11/2014   Mild to moderate on the left side and slight to mild on the right side per audiometry 05/2014.  Hearing aides with possible masking of tinnitus recommended but patient wished to defer secondary to finances.  . Blood transfusion without reported diagnosis    pt denies  . Bursitis of right shoulder 07/12/2012   s/p shoulder injection 07/12/2012   . Cataract of right eye   . Constipation due to pain medication 04/27/2010  . Diverticulosis 02/08/2012   Extensive left-sided diverticula on colonoscopy March 2012 per Dr. Gala Romney   . Essential hypertension 07/20/2006  . Genital herpes 07/20/2006  . 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 Heart murmur 1961  . Human immunodeficiency virus disease ([place]) 03/27/1986  . Hyperlipidemia LDL goal < 100 04/05/2012  . Long-term current use of opiate analgesic 03/17/2016  . Lumbar degenerative disc disease 07/20/2006   With chronic back pain   . Micturition syncope 09/20/2015  . 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[place]) 11/01/2011   s/p aortobifem bypass 2009   . Periumbilical hernia 123XX123   1 cm left periumbilical abdominal wall defect  . 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 Right rotator cuff tear 02/01/2013   Responds to periodic  steroid injections  . Seborrhea 09/01/2010  . Small bowel obstruction due to adhesions 02/08/2012   s/p Exploratory laparotomy, lysis of adhesions 02/12/12    . 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 Tobacco abuse 02/19/2012  . Tobacco abuse   . Vasovagal syncope 02/15/2015  . Voiding dysfunction    s/p cystoscopy and meatal dilation Dec 2005    Review of Systems: Please see pertinent ROS reviewed in HPI and problem based charting.   Physical Exam: Vitals:   04/18/16 1500  BP: (!) 153/63  Pulse: 63  Temp: 97.3 F (36.3 C)  TempSrc: Oral  SpO2: 100%  Weight: 140 lb (63.5 kg)   General: Vital signs reviewed.  Patient is elderly, thin, in no acute distress and cooperative with exam.  Cardiovascular: RRR, S1 normal, S2 normal, no murmurs, gallops, or rubs. Pulmonary/Chest: Clear to auscultation bilaterally, no wheezes, rales, or rhonchi. Musculoskeletal: Tenderness over lateral and posterior right shoulder, decreased ROM. Skin: Warm, dry and intact. No rashes or erythema.  Assessment & Plan:  See encounters tab for problem based medical decision making. Patient seen with Dr. Angelia Mould

## 2016-04-18 NOTE — Assessment & Plan Note (Addendum)
Patient admits to a history of right shoulder subacromial bursitis for which she has received steroid injections in the past. Her last injection was over 6 months ago and they typically last for 2 months. She denies any recent injury of falls. Her right shoulder hurts worst with movement, raising her arm up or laying on her right side. She denies any recent illness, fever or chills.   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  Consent for procedure obtained. Time Out: Verified patient identification, verified procedure, site/side was marked, verified correct patient position, special equipment/implants available, medications/allergies/relevent history reviewed, required imaging and test results available.  Performed  [employer] identified.  Sterile technique was used including antiseptics, gloves, hand hygiene and sheet. Skin prep: Iodine solution; local anesthetic administered.  Using a 25 G needle a mixture of 1 cc of 1% lidocaine with 40 mg of Kenalog was injected into the right subacromial space using a lateral approach.   Evaluation Patient reported slight immediate numbness and improvement at the site.  Complications: No apparent complications.  The above was performed under supervision of Dr. Joni Reining.  Martyn Malay, DO PGY-3 Internal Medicine Resident Pager # [phone] 04/18/2016 4:04 PM

## 2016-04-18 NOTE — Telephone Encounter (Signed)
Informed doriss. Of need, read her dr Caroline More note, she will schedule pt

## 2016-04-18 NOTE — Patient Instructions (Signed)
Today you received a lidocaine and steroid injection into your right shoulder for bursitis.     Bursitis Introduction Bursitis is when the fluid-filled sac (bursa) that covers and protects a joint is swollen (inflamed). Bursitis is most common near joints, especially the knees, elbows, hips, and shoulders. Follow these instructions at home:  Take medicines only as told by your doctor.  If you were prescribed an antibiotic medicine, finish it all even if you start to feel better.  Rest the affected area as told by your doctor.  Keep the area raised up.  Avoid doing things that make the pain worse.  Apply ice to the injured area:  Place ice in a plastic bag.  Place a towel between your skin and the bag.  Leave the ice on for 20 minutes, 2-3 times a day.  Use splints, braces, pads, or walking aids as told by your doctor.  Keep all follow-up visits as told by your doctor. This is important. Contact a doctor if:  You have more pain with home care.  You have a fever.  You have chills. This information is not intended to replace advice given to you by your health care provider. Make sure you discuss any questions you have with your health care provider. Document Released: 08/17/2009 Document Revised: 08/05/2015 Document Reviewed: 05/19/2013  2017 Elsevier

## 2016-04-20 NOTE — Progress Notes (Signed)
Internal Medicine Clinic Attending  I saw and evaluated the patient.  I personally confirmed the key portions of the history and exam documented by Dr. Quay Burow and I reviewed pertinent patient test results.  The assessment, diagnosis, and plan were formulated together and I agree with the documentation in the resident's note. I was present and assisted during the entire procedure.

## 2016-05-30 ENCOUNTER — Other Ambulatory Visit: Payer: Self-pay | Admitting: Internal Medicine

## 2016-05-30 ENCOUNTER — Other Ambulatory Visit: Payer: Self-pay | Admitting: Infectious Disease

## 2016-05-30 DIAGNOSIS — R21 Rash and other nonspecific skin eruption: Secondary | ICD-10-CM

## 2016-05-30 NOTE — Telephone Encounter (Signed)
Reviewed Dr Caroline More note 12/08/15, patient has responded appropriately to bisphosphonate therapy, plan to continue.  It appears this was started back in 2014.  Will provide refill.

## 2016-05-31 ENCOUNTER — Other Ambulatory Visit: Payer: Self-pay | Admitting: *Deleted

## 2016-05-31 DIAGNOSIS — I10 Essential (primary) hypertension: Secondary | ICD-10-CM

## 2016-06-01 MED ORDER — AMLODIPINE BESYLATE 10 MG PO TABS: 10.0000 mg | ORAL_TABLET | Freq: Every day | ORAL | 3 refills | Status: DC

## 2016-06-15 ENCOUNTER — Telehealth: Payer: Self-pay | Admitting: Internal Medicine

## 2016-06-15 NOTE — Telephone Encounter (Signed)
Calling to confirm appt for 06/16/16 at 10:15 phone disconnected

## 2016-06-16 ENCOUNTER — Encounter: Payer: Self-pay | Admitting: Internal Medicine

## 2016-06-16 ENCOUNTER — Telehealth: Payer: Self-pay | Admitting: *Deleted

## 2016-06-16 ENCOUNTER — Ambulatory Visit (INDEPENDENT_AMBULATORY_CARE_PROVIDER_SITE_OTHER): Payer: Medicare HMO | Admitting: Internal Medicine

## 2016-06-16 VITALS — BP 150/69 | HR 73 | Temp 98.2°F | Wt 141.0 lb

## 2016-06-16 DIAGNOSIS — Z87898 Personal history of other specified conditions: Secondary | ICD-10-CM

## 2016-06-16 DIAGNOSIS — F411 Generalized anxiety disorder: Secondary | ICD-10-CM | POA: Diagnosis not present

## 2016-06-16 DIAGNOSIS — F17210 Nicotine dependence, cigarettes, uncomplicated: Secondary | ICD-10-CM | POA: Diagnosis not present

## 2016-06-16 DIAGNOSIS — M75111 Incomplete rotator cuff tear or rupture of right shoulder, not specified as traumatic: Secondary | ICD-10-CM

## 2016-06-16 DIAGNOSIS — Z79899 Other long term (current) drug therapy: Secondary | ICD-10-CM

## 2016-06-16 DIAGNOSIS — Z79891 Long term (current) use of opiate analgesic: Secondary | ICD-10-CM | POA: Diagnosis not present

## 2016-06-16 DIAGNOSIS — R55 Syncope and collapse: Secondary | ICD-10-CM | POA: Diagnosis not present

## 2016-06-16 DIAGNOSIS — I739 Peripheral vascular disease, unspecified: Secondary | ICD-10-CM | POA: Diagnosis not present

## 2016-06-16 DIAGNOSIS — F1290 Cannabis use, unspecified, uncomplicated: Secondary | ICD-10-CM

## 2016-06-16 DIAGNOSIS — Z21 Asymptomatic human immunodeficiency virus [HIV] infection status: Secondary | ICD-10-CM

## 2016-06-16 DIAGNOSIS — M5136 Other intervertebral disc degeneration, lumbar region: Secondary | ICD-10-CM

## 2016-06-16 DIAGNOSIS — M810 Age-related osteoporosis without current pathological fracture: Secondary | ICD-10-CM | POA: Diagnosis not present

## 2016-06-16 DIAGNOSIS — M25552 Pain in left hip: Secondary | ICD-10-CM | POA: Diagnosis not present

## 2016-06-16 DIAGNOSIS — Z0000 Encounter for general adult medical examination without abnormal findings: Secondary | ICD-10-CM

## 2016-06-16 DIAGNOSIS — G8929 Other chronic pain: Secondary | ICD-10-CM

## 2016-06-16 DIAGNOSIS — E785 Hyperlipidemia, unspecified: Secondary | ICD-10-CM | POA: Diagnosis not present

## 2016-06-16 DIAGNOSIS — M75101 Unspecified rotator cuff tear or rupture of right shoulder, not specified as traumatic: Secondary | ICD-10-CM | POA: Diagnosis not present

## 2016-06-16 DIAGNOSIS — E7849 Other hyperlipidemia: Secondary | ICD-10-CM

## 2016-06-16 DIAGNOSIS — Z720 Tobacco use: Secondary | ICD-10-CM

## 2016-06-16 DIAGNOSIS — R69 Illness, unspecified: Secondary | ICD-10-CM | POA: Diagnosis not present

## 2016-06-16 MED ORDER — FENTANYL 25 MCG/HR TD PT72: 25.0000 ug | MEDICATED_PATCH | TRANSDERMAL | 0 refills | Status: DC

## 2016-06-16 MED ORDER — HYDROCODONE-ACETAMINOPHEN 10-325 MG PO TABS: 1.0000 | ORAL_TABLET | Freq: Three times a day (TID) | ORAL | 0 refills | Status: DC | PRN

## 2016-06-16 NOTE — Telephone Encounter (Signed)
Pt here for visit with pcp and presents paperwork from [facility] stating they will no longer cover alendronate.  PA request was submitted online via Cover My Meds-request sent for review.Jessica Nickerson Cassady4/6/201812:00 PM       ZRAQT:[id number] RxGroup: [id number] PCN: [id number] Pt ID: [id number]

## 2016-06-16 NOTE — Assessment & Plan Note (Signed)
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 She denies any trauma or fevers. Physical exam was not suggestive of a pseudoaneurysm in this area. 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 She does not have signs or symptoms consistent with claudication and therefore I do not believe it is related to her peripheral vascular occlusive disease.  Plan  She was encouraged to quit smoking but is not ready at this time. We are aggressively treating her hyperlipidemia with statin therapy. There was not enough time during this visit to address her hypertension and this will be prioritized at the next visit. We will also continue the aspirin at 81 mg by mouth daily.

## 2016-06-16 NOTE — Assessment & Plan Note (Signed)
She is currently up-to-date on her health maintenance.

## 2016-06-16 NOTE — Assessment & Plan Note (Signed)
Assessment  As I have been lowering the hydrocodone number of tablets per month she states that going from 70 to 65 tablets/month had a significant impact on the ability for her to control her chronic pain. She also notes new pain in the left hip area. At the last clinic visit a UDS was inappropriate in that there were no fentanyl or fentanyl metabolites found despite her claim she was using the fentanyl. The hydrocodone metabolites were present. I was unable to reach her over the phone despite multiple attempts and so we discussed these results today. She was surprised that there was no fentanyl metabolites in her urine at the last visit. She does state that the patches do fall off of her skin about 2-3 times per week. She states she uses tape to keep them on the skin. She also cuts the patches into 2 to place them in different areas of the body that are most painful whether it be near the back or near the groin. She feels it is quite possible the fentanyl patch fell off prior to her previous urine drug screen.  Plan  I repeated the urine drug screen prior to seeing her today in clinic. We are waiting the results to see, if in fact, the UDS is appropriate at this time. That being said, I decided to decrease the fentanyl to 25 g every 3 days and go back up on the hydrocodone-acetaminophen 10 mg-325 mg 1 tablet every 6 hours as needed, dispense #90 per month. If her UDS today is appropriate and this adjustment in her regimen remains effective in allowing her to continue to be independent and ambulatory we will continue the regimen as outlined above. If she fails to have fentanyl in her UDS from this morning I will continue to wean the fentanyl to 12.5 g every 3 days at the follow-up visit prior to discontinuing it altogether thereafter. If her left hip pain continues to bother her we will check a plain film to assess for evidence of osteoarthritis and/or avascular necrosis given her HIV disease.

## 2016-06-16 NOTE — Patient Instructions (Signed)
It was good to see you again.  I am sorry you are having pain in your left hip.  The good news is that it is not related to your previous surgery.  Unfortunately, I think it may be related to arthritis.  If it gets worse we may need to do an X-ray.  1) I adjusted you pain medications.  I went up on your hydrocodone and down on your fentanyl patch dose.  I believe this will give you more flexibility on treating your pain on an as needed basis.  I also repeated the urine drug test to see if the previous study was wrong when it did not show any fentanyl in your system.  You were given three months of prescriptions.  2) Keep taking your other medications as you are.  We did not have today to review all of them but your medical problems are doing well today so we will review them next time in detail if we have time.  3) We will look into the alendronate issue with your insurance company.  Ideally you need 10 more months of that therapy before we can give you a holiday from it.  I will see you back in 3 months, sooner if necessary.

## 2016-06-16 NOTE — Assessment & Plan Note (Signed)
Assessment  She is been tolerating the alendronate on a weekly basis for the last 4+ years. Her insurance company states there are some issues with this medication but it is unclear exactly what those issues are upon my review of the letter she received.  Plan  We will continue the alendronate 70 mg by mouth weekly and contact her insurance company to get more specifics as to what the issue is. The plan was to continue the alendronate through February 2019 as this would be the five-year mark and then to reassess after a drug holiday. I am hopeful the insurance issue is easily resolved so we can complete this plan.

## 2016-06-16 NOTE — Assessment & Plan Note (Signed)
Assessment  She was encouraged to quit smoking and we spent at least 3 minutes discussing the personal health benefits she would achieve if she successfully quit. She is in the pre-contemplative stage at this time and is not ready to quit smoking.  Plan  I asked her to contact us if she were to change her mind as we could provide her with pharmacologic therapy to assist in smoking cessation. We will reassess her readiness to quit smoking at the follow-up visit.

## 2016-06-16 NOTE — Progress Notes (Signed)
   Subjective:    Patient ID: Jessica [name], female    DOB: [birth year], 76 y.o.   MRN: [id number]  HPI  Jessica [name] is here for follow-up of her chronic low back pain and discussion of her opoid regimen and the inappropriate UDS at the last visit. Please see the A&P for the status of the pt's chronic medical problems.  Review of Systems  Constitutional: Negative for activity change, appetite change and unexpected weight change.  Respiratory: Negative for shortness of breath.   Cardiovascular: Negative for chest pain, palpitations and leg swelling.  Gastrointestinal: Negative for diarrhea, nausea and vomiting.  Musculoskeletal: Positive for arthralgias and back pain. Negative for joint swelling and myalgias.  Skin: Negative for rash.  Neurological: Negative for syncope and light-headedness.      Objective:   Physical Exam  Constitutional: She is oriented to person, place, and time. She appears well-developed and well-nourished. No distress.  HENT:  Head: Normocephalic and atraumatic.  Eyes: Conjunctivae are normal. Right eye exhibits no discharge. Left eye exhibits no discharge. No scleral icterus.  Abdominal: Soft. She exhibits no distension. There is no tenderness. There is no rebound and no guarding.  Musculoskeletal: Normal range of motion. She exhibits no edema, tenderness or deformity.  No tenderness to palpation in the left inguinal ligament area.  No palpable left pseudoaneurysm.  Neurological: She is alert and oriented to person, place, and time. She exhibits normal muscle tone. Coordination normal.  Skin: Skin is warm and dry. No rash noted. She is not diaphoretic. No erythema.  Psychiatric: She has a normal mood and affect. Her behavior is normal. Judgment and thought content normal.  Nursing note and vitals reviewed.     Assessment & Plan:   Please see problem oriented charting.

## 2016-06-16 NOTE — Assessment & Plan Note (Signed)
Assessment  She has had no interim episodes of micturition syncope.  Plan  We will reassess for recurrence of her micturition syncope at the follow-up visit.

## 2016-06-16 NOTE — Assessment & Plan Note (Signed)
Assessment  She is tolerating the atorvastatin 10 mg by mouth daily without myalgias.  Plan  We will continue with this moderate intensity statin and reassess for intolerances at the follow-up visit. 

## 2016-06-16 NOTE — Assessment & Plan Note (Signed)
Assessment  Since I last saw her she underwent a steroid injection in her right subacromial bursa. This resolved the pain she was experiencing at that time.  Plan  We will reassess for recurrence of right shoulder pain and her interest and need in repeating the shoulder injection at that time.

## 2016-06-16 NOTE — Assessment & Plan Note (Signed)
Assessment  Her anxiety is well controlled on the alprazolam 1 mg by mouth every 8 hours as needed for anxiety.  Plan  We will continue the alprazolam at 1 mg by mouth every 8 hours as needed for anxiety and reassess the efficacy of this therapy at the follow-up visit.

## 2016-06-22 ENCOUNTER — Ambulatory Visit: Payer: Medicare HMO

## 2016-06-27 LAB — TOXASSURE SELECT,+ANTIDEPR,UR

## 2016-07-03 ENCOUNTER — Encounter: Payer: Self-pay | Admitting: Internal Medicine

## 2016-07-03 DIAGNOSIS — F1290 Cannabis use, unspecified, uncomplicated: Secondary | ICD-10-CM

## 2016-07-03 HISTORY — DX: Cannabis use, unspecified, uncomplicated: F12.90

## 2016-07-03 NOTE — Progress Notes (Signed)
Patient ID: Jessica [name], female   DOB: 12-28-1940, 76 y.o.   MRN: [id number]  UDS demonstrated both hydrocodone and fentanyl.  The lack of fentanyl on the previous specimen was likely secondary to the patch falling off sometime prior to the last urine collection.  That being said, we will stick with the changes made at the appointment and decrease the fentanyl dose given the medication is occasionally falling off to the point that there is no metabolites in the urine.  Interestingly, alprazolam was not detected in the urine and the last use was less than 24 hours before.  This suggests she may not take it on a daily basis and it may be appropriate to lower the number prescribed per month.  Will discuss this at the next visit.  Finally, THC was detected.

## 2016-07-06 ENCOUNTER — Encounter: Payer: Self-pay | Admitting: Infectious Disease

## 2016-07-06 ENCOUNTER — Other Ambulatory Visit: Payer: Self-pay

## 2016-07-06 DIAGNOSIS — I10 Essential (primary) hypertension: Secondary | ICD-10-CM

## 2016-07-07 ENCOUNTER — Other Ambulatory Visit: Payer: Self-pay

## 2016-07-07 DIAGNOSIS — F411 Generalized anxiety disorder: Secondary | ICD-10-CM

## 2016-07-07 MED ORDER — BENAZEPRIL HCL 40 MG PO TABS: 40.0000 mg | ORAL_TABLET | Freq: Every day | ORAL | 3 refills | Status: DC

## 2016-07-07 MED ORDER — ALPRAZOLAM 1 MG PO TABS: 1.0000 mg | ORAL_TABLET | Freq: Three times a day (TID) | ORAL | 3 refills | Status: DC | PRN

## 2016-07-10 NOTE — Telephone Encounter (Signed)
Xanax rx called to [facility].

## 2016-07-21 NOTE — Telephone Encounter (Signed)
Follow up call made to [facility].  I was informed that PA request had been approved through Dec 2018. No further action needed at this time, phone call complete.Despina Hidden Cassady5/11/20181:48 PM

## 2016-07-31 DIAGNOSIS — Z7982 Long term (current) use of aspirin: Secondary | ICD-10-CM | POA: Diagnosis not present

## 2016-07-31 DIAGNOSIS — Z0000 Encounter for general adult medical examination without abnormal findings: Secondary | ICD-10-CM | POA: Diagnosis not present

## 2016-07-31 DIAGNOSIS — R3915 Urgency of urination: Secondary | ICD-10-CM | POA: Diagnosis not present

## 2016-07-31 DIAGNOSIS — Z79899 Other long term (current) drug therapy: Secondary | ICD-10-CM | POA: Diagnosis not present

## 2016-07-31 DIAGNOSIS — K08409 Partial loss of teeth, unspecified cause, unspecified class: Secondary | ICD-10-CM | POA: Diagnosis not present

## 2016-07-31 DIAGNOSIS — R69 Illness, unspecified: Secondary | ICD-10-CM | POA: Diagnosis not present

## 2016-07-31 DIAGNOSIS — Z79891 Long term (current) use of opiate analgesic: Secondary | ICD-10-CM | POA: Diagnosis not present

## 2016-07-31 DIAGNOSIS — Z972 Presence of dental prosthetic device (complete) (partial): Secondary | ICD-10-CM | POA: Diagnosis not present

## 2016-07-31 DIAGNOSIS — G8929 Other chronic pain: Secondary | ICD-10-CM | POA: Diagnosis not present

## 2016-07-31 DIAGNOSIS — H409 Unspecified glaucoma: Secondary | ICD-10-CM | POA: Diagnosis not present

## 2016-07-31 DIAGNOSIS — Z6821 Body mass index (BMI) 21.0-21.9, adult: Secondary | ICD-10-CM | POA: Diagnosis not present

## 2016-07-31 DIAGNOSIS — I10 Essential (primary) hypertension: Secondary | ICD-10-CM | POA: Diagnosis not present

## 2016-07-31 DIAGNOSIS — K5900 Constipation, unspecified: Secondary | ICD-10-CM | POA: Diagnosis not present

## 2016-07-31 DIAGNOSIS — Z9181 History of falling: Secondary | ICD-10-CM | POA: Diagnosis not present

## 2016-07-31 DIAGNOSIS — K219 Gastro-esophageal reflux disease without esophagitis: Secondary | ICD-10-CM | POA: Diagnosis not present

## 2016-07-31 DIAGNOSIS — E785 Hyperlipidemia, unspecified: Secondary | ICD-10-CM | POA: Diagnosis not present

## 2016-08-04 ENCOUNTER — Other Ambulatory Visit: Payer: Self-pay | Admitting: *Deleted

## 2016-08-04 DIAGNOSIS — M5136 Other intervertebral disc degeneration, lumbar region: Secondary | ICD-10-CM

## 2016-08-04 MED ORDER — CYCLOBENZAPRINE HCL 10 MG PO TABS: 10.0000 mg | ORAL_TABLET | Freq: Three times a day (TID) | ORAL | 3 refills | Status: DC | PRN

## 2016-08-23 ENCOUNTER — Other Ambulatory Visit: Payer: Self-pay | Admitting: Internal Medicine

## 2016-08-23 DIAGNOSIS — I10 Essential (primary) hypertension: Secondary | ICD-10-CM

## 2016-08-25 ENCOUNTER — Encounter: Payer: Self-pay | Admitting: Internal Medicine

## 2016-08-25 ENCOUNTER — Ambulatory Visit (INDEPENDENT_AMBULATORY_CARE_PROVIDER_SITE_OTHER): Payer: Medicare HMO | Admitting: Internal Medicine

## 2016-08-25 VITALS — BP 150/63 | HR 50 | Temp 98.2°F | Ht 68.0 in | Wt 142.3 lb

## 2016-08-25 DIAGNOSIS — Z21 Asymptomatic human immunodeficiency virus [HIV] infection status: Secondary | ICD-10-CM

## 2016-08-25 DIAGNOSIS — F17210 Nicotine dependence, cigarettes, uncomplicated: Secondary | ICD-10-CM | POA: Diagnosis not present

## 2016-08-25 DIAGNOSIS — Z0000 Encounter for general adult medical examination without abnormal findings: Secondary | ICD-10-CM

## 2016-08-25 DIAGNOSIS — Z79891 Long term (current) use of opiate analgesic: Secondary | ICD-10-CM | POA: Diagnosis not present

## 2016-08-25 DIAGNOSIS — B20 Human immunodeficiency virus [HIV] disease: Secondary | ICD-10-CM

## 2016-08-25 DIAGNOSIS — I10 Essential (primary) hypertension: Secondary | ICD-10-CM

## 2016-08-25 DIAGNOSIS — Z79899 Other long term (current) drug therapy: Secondary | ICD-10-CM

## 2016-08-25 DIAGNOSIS — M5136 Other intervertebral disc degeneration, lumbar region: Secondary | ICD-10-CM

## 2016-08-25 DIAGNOSIS — R69 Illness, unspecified: Secondary | ICD-10-CM | POA: Diagnosis not present

## 2016-08-25 MED ORDER — FENTANYL 25 MCG/HR TD PT72: 25.0000 ug | MEDICATED_PATCH | TRANSDERMAL | 0 refills | Status: DC

## 2016-08-25 MED ORDER — HYDROCODONE-ACETAMINOPHEN 10-325 MG PO TABS: 1.0000 | ORAL_TABLET | Freq: Three times a day (TID) | ORAL | 0 refills | Status: DC | PRN

## 2016-08-25 MED ORDER — BACLOFEN 10 MG PO TABS: 10.0000 mg | ORAL_TABLET | Freq: Three times a day (TID) | ORAL | 11 refills | Status: DC | PRN

## 2016-08-25 NOTE — Progress Notes (Signed)
   Subjective:    Patient ID: Jessica [name], female    DOB: [birth year], 76 y.o.   MRN: [id number]  HPI  Jessica [name] is here for evaluation of acute left sided back pain, chronic pain, hypertension, and HIV. Please see the A&P for the status of the pt's chronic medical problems.  With regards to her acute back pain she was in her usual state of health until earlier this week when she bent over to place some close in the washer. Upon bending she had an acute pain along the left side of her mid back that radiated across the back. The pain was severe and jolted her and she had difficulty standing up straight thereafter because of the pain. She denies any trauma acutely or recently. She also denies any falls. There is no numbness or weakness associated with this pain. Her bowel and bladder function are unremarkable at this time. She denies any fevers, shakes, chills, or weight loss recently. The pain has not been responsive to her chronic fentanyl and Vicodin as well as the as needed ibuprofen which she has taken 400 mg every 6 hours without relief.  Review of Systems  Constitutional: Negative for activity change, appetite change, chills, diaphoresis, fever and unexpected weight change.  Endocrine: Negative for polyuria.  Genitourinary: Positive for flank pain. Negative for difficulty urinating.  Musculoskeletal: Positive for arthralgias, back pain and myalgias. Negative for gait problem and joint swelling.  Skin: Negative for rash and wound.  Neurological: Negative for syncope, weakness and numbness.  Psychiatric/Behavioral: Negative for sleep disturbance.      Objective:   Physical Exam  Constitutional: She is oriented to person, place, and time. She appears well-developed and well-nourished. No distress.  HENT:  Head: Normocephalic and atraumatic.  Eyes: Conjunctivae are normal. Right eye exhibits no discharge. Left eye exhibits no discharge. No scleral icterus.    Musculoskeletal: Normal range of motion.  Left mid-back paraspinal muscle spasm with associated tenderness in the lower thoracic region.  The spine is non-tender and there is no step off.  The right paraspinous muscles are without spasm or tenderness.  Neurological: She is alert and oriented to person, place, and time. She exhibits normal muscle tone.  Skin: Skin is warm and dry. No rash noted. She is not diaphoretic. No erythema.  Psychiatric: She has a normal mood and affect. Her behavior is normal. Judgment and thought content normal.  Nursing note and vitals reviewed.     Assessment & Plan:   Please see problem based charting.

## 2016-08-25 NOTE — Assessment & Plan Note (Addendum)
She is currently up-to-date on her health care maintenance.  As she is 76 years old she technically remains eligible for low dose CT lung cancer screening.  We briefly discussed this today but I wanted to review the guidelines to be sure she qualified.  This will be discussed at the follow-up visit.

## 2016-08-25 NOTE — Patient Instructions (Signed)
It was good to see you again.  I am sorry about your severe back apin.  1) Stop the flexeril (cyclobenzaprine) and start baclofen 10 mg every 8 hours as needed for back pain.  Take every day for 7 days to see if we can get control of your back pain.  2) Increase the advil to 4 tablets 4 times a day for 1 week also to get control of your back pain.  3) I refilled your fentanyl and Vicodin with 3 refills handed to you.  It should get you through September.  4) Keep taking your other medications as you are.  I will see you in 3 months, sooner if necessary.

## 2016-08-25 NOTE — Assessment & Plan Note (Signed)
Assessment  She is followed closely by the [facility] for Infectious Diseases and is tolerating her antiviral regimen well.  Plan  She will continue with her current antiretrovirals and maintain her close relationship and follow-up with [facility] for Infectious Diseases. We will reassess her tolerance of her antiretroviral regimen at the follow-up visit.

## 2016-08-25 NOTE — Assessment & Plan Note (Signed)
Assessment  Her blood pressure was mildly elevated at 150/63. This is on amlodipine 10 mg by mouth daily, benazepril 40 mg by mouth daily, metoprolol 50 mg by mouth twice daily, and spironolactone 100 mg by mouth daily. I'm concerned that with her acute back pain the blood pressure may be somewhat influenced by the symptoms. I do not believe it would be appropriate to make adjustments in her regimen at this time and reassess the blood pressure once her acute pain has resolved.  Plan  We will continue the amlodipine at 10 mg by mouth daily, benazepril at 40 mg by mouth daily, metoprolol at 50 mg by mouth twice daily, and spironolactone at 100 mg by mouth daily. We will reassess her blood pressure at the follow-up visit.

## 2016-08-25 NOTE — Assessment & Plan Note (Signed)
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 This resulted in spasm of the left paraspinous muscles in the thoracic region that has resulted in her symptoms and exam findings. Not surprisingly it has not responded to opiates although I am concerned that the Flexeril and ibuprofen have been ineffective. Her daughter states ,and the patient confirms, that the pain did improve somewhat with a heating pad.  Plan  She was encouraged to continue to use the heating pad and to consider taking warm baths. She was advised to increase the ibuprofen to 800 mg by mouth 4 times a day for the next one week to be taken on a regular basis with food rather than as needed. After one week it is hoped that her pain will be improved and she can go back to the 400 mg as needed. Given the ineffectiveness of the cyclobenzaprine this was discontinued and a new prescription for baclofen 10 mg by mouth every 8 hours as needed for pain was sent to her pharmacy. For the next week she was asked to take the Baclofen 10 mg every 8 hours as scheduled rather than as needed unless she became overly sedated. It is hoped that in combination with the scheduled nonsteroidal anti-inflammatories this new muscle relaxant will address the paraspinous muscle spasm on the left and resolve the acute pain. With regards to the chronic pain the fentanyl and the Vicodin were continued and she was given 3 written prescriptions which should get her through September. We will reassess the acute, as well as the chronic pain at the follow-up visit.

## 2016-09-20 ENCOUNTER — Other Ambulatory Visit: Payer: Self-pay | Admitting: Infectious Disease

## 2016-09-20 ENCOUNTER — Other Ambulatory Visit: Payer: Self-pay | Admitting: Internal Medicine

## 2016-09-20 DIAGNOSIS — F411 Generalized anxiety disorder: Secondary | ICD-10-CM

## 2016-09-20 DIAGNOSIS — I10 Essential (primary) hypertension: Secondary | ICD-10-CM

## 2016-09-20 DIAGNOSIS — R21 Rash and other nonspecific skin eruption: Secondary | ICD-10-CM

## 2016-09-20 NOTE — Telephone Encounter (Signed)
Alprazolam called in to [facility].

## 2016-09-26 DIAGNOSIS — M79671 Pain in right foot: Secondary | ICD-10-CM | POA: Diagnosis not present

## 2016-09-26 DIAGNOSIS — M79672 Pain in left foot: Secondary | ICD-10-CM | POA: Diagnosis not present

## 2016-09-26 DIAGNOSIS — B353 Tinea pedis: Secondary | ICD-10-CM | POA: Diagnosis not present

## 2016-09-28 ENCOUNTER — Other Ambulatory Visit: Payer: Self-pay | Admitting: Internal Medicine

## 2016-10-31 ENCOUNTER — Other Ambulatory Visit: Payer: Self-pay | Admitting: Internal Medicine

## 2016-11-01 DIAGNOSIS — H401124 Primary open-angle glaucoma, left eye, indeterminate stage: Secondary | ICD-10-CM | POA: Diagnosis not present

## 2016-11-02 ENCOUNTER — Other Ambulatory Visit: Payer: Self-pay | Admitting: Internal Medicine

## 2016-11-20 ENCOUNTER — Other Ambulatory Visit: Payer: Self-pay | Admitting: Internal Medicine

## 2016-11-20 DIAGNOSIS — M5136 Other intervertebral disc degeneration, lumbar region: Secondary | ICD-10-CM

## 2016-11-24 ENCOUNTER — Other Ambulatory Visit: Payer: Self-pay | Admitting: Infectious Disease

## 2016-11-24 DIAGNOSIS — B029 Zoster without complications: Secondary | ICD-10-CM

## 2016-11-28 ENCOUNTER — Other Ambulatory Visit: Payer: Self-pay | Admitting: Internal Medicine

## 2016-11-28 DIAGNOSIS — M5136 Other intervertebral disc degeneration, lumbar region: Secondary | ICD-10-CM

## 2016-12-04 ENCOUNTER — Ambulatory Visit: Payer: Medicare HMO

## 2016-12-06 ENCOUNTER — Ambulatory Visit (INDEPENDENT_AMBULATORY_CARE_PROVIDER_SITE_OTHER): Payer: Medicare HMO | Admitting: Internal Medicine

## 2016-12-06 VITALS — BP 124/58 | HR 53 | Temp 98.1°F | Ht 68.0 in | Wt 141.1 lb

## 2016-12-06 DIAGNOSIS — F17210 Nicotine dependence, cigarettes, uncomplicated: Secondary | ICD-10-CM

## 2016-12-06 DIAGNOSIS — Z841 Family history of disorders of kidney and ureter: Secondary | ICD-10-CM

## 2016-12-06 DIAGNOSIS — Z79891 Long term (current) use of opiate analgesic: Secondary | ICD-10-CM | POA: Diagnosis not present

## 2016-12-06 DIAGNOSIS — Z833 Family history of diabetes mellitus: Secondary | ICD-10-CM

## 2016-12-06 DIAGNOSIS — Z8249 Family history of ischemic heart disease and other diseases of the circulatory system: Secondary | ICD-10-CM

## 2016-12-06 DIAGNOSIS — M25511 Pain in right shoulder: Secondary | ICD-10-CM

## 2016-12-06 DIAGNOSIS — Z8261 Family history of arthritis: Secondary | ICD-10-CM

## 2016-12-06 DIAGNOSIS — Z23 Encounter for immunization: Secondary | ICD-10-CM | POA: Diagnosis not present

## 2016-12-06 DIAGNOSIS — Z83511 Family history of glaucoma: Secondary | ICD-10-CM

## 2016-12-06 DIAGNOSIS — I10 Essential (primary) hypertension: Secondary | ICD-10-CM

## 2016-12-06 DIAGNOSIS — Z79899 Other long term (current) drug therapy: Secondary | ICD-10-CM

## 2016-12-06 DIAGNOSIS — Z8349 Family history of other endocrine, nutritional and metabolic diseases: Secondary | ICD-10-CM | POA: Diagnosis not present

## 2016-12-06 DIAGNOSIS — F411 Generalized anxiety disorder: Secondary | ICD-10-CM

## 2016-12-06 DIAGNOSIS — G8929 Other chronic pain: Secondary | ICD-10-CM

## 2016-12-06 DIAGNOSIS — Z830 Family history of human immunodeficiency virus [HIV] disease: Secondary | ICD-10-CM

## 2016-12-06 DIAGNOSIS — M5136 Other intervertebral disc degeneration, lumbar region: Secondary | ICD-10-CM

## 2016-12-06 DIAGNOSIS — R69 Illness, unspecified: Secondary | ICD-10-CM | POA: Diagnosis not present

## 2016-12-06 MED ORDER — HYDROCODONE-ACETAMINOPHEN 10-325 MG PO TABS: 1.0000 | ORAL_TABLET | Freq: Three times a day (TID) | ORAL | 0 refills | Status: DC | PRN

## 2016-12-06 MED ORDER — FENTANYL 25 MCG/HR TD PT72: 25.0000 ug | MEDICATED_PATCH | TRANSDERMAL | 0 refills | Status: DC

## 2016-12-06 MED ORDER — ALPRAZOLAM 1 MG PO TABS: 1.0000 mg | ORAL_TABLET | Freq: Three times a day (TID) | ORAL | 3 refills | Status: DC | PRN

## 2016-12-06 NOTE — Assessment & Plan Note (Addendum)
Patient describes her pain as worse since her last visit but denies any new insults or injuries to her back.  She feels that she is reliant on the opiates to control her pain but she is willing to continue the lower dose fentanyl patch.  She understands the need to try to reduce her opiates.  She is still trying to use the ibuprofen and heating pad however she feels it doesn't help that much.  -continue current pain management plan in place as patient will follow-up with PCP oct 19 and can discuss further -The visit was very limited by the patient showing up very late for her appointment and still being allowed to be seen.  I would've like to talk more with her about her pain and what we could do to help her with it that did not require opiates

## 2016-12-06 NOTE — Progress Notes (Signed)
CC: R shoulder pain, follow up on chronic pain due to degenerative disc disease of lumbar spine, anxiety  HPI:  [name] is a 76 y.o. here to address her chronic pain and receive a shoulder injection.  However, as she arrived late for appointment we were only able to address refilling her prescriptions for her back pain.  She agreed to come back to clinic in the next few weeks for the shoulder injection.  She mentions she's been doing okay on the reduced fentanyl dose and that she is able to function well on her current pain regimen.    Please see A&P for status of the patient's chronic medical conditions  Past Medical History:  Diagnosis Date  . Anxiety 04/05/2012  . Bilateral sensorineural hearing loss 06/11/2014   Mild to moderate on the left side and slight to mild on the right side per audiometry 05/2014.  Hearing aides with possible masking of tinnitus recommended but patient wished to defer secondary to finances.  . Blood transfusion without reported diagnosis    pt denies  . Bursitis of right shoulder 07/12/2012   s/p shoulder injection 07/12/2012   . Cataract of right eye   . Constipation due to pain medication 04/27/2010  . Diverticulosis 02/08/2012   Extensive left-sided diverticula on colonoscopy March 2012 per Dr. Gala Romney   . Essential hypertension 07/20/2006  . Genital herpes 07/20/2006  . 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 Heart murmur 1961  . Human immunodeficiency virus disease ([place]) 03/27/1986  . Hyperlipidemia LDL goal < 100 04/05/2012  . Long-term current use of opiate analgesic 03/17/2016  . Lumbar degenerative disc disease 07/20/2006   With chronic back pain   . Marijuana use 07/03/2016  . Micturition syncope 09/20/2015  . 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[place]) 11/01/2011   s/p aortobifem bypass 2009   . Periumbilical hernia 11/16/7589   1 cm left periumbilical abdominal wall defect  . 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 Right rotator cuff tear 02/01/2013   Responds to periodic steroid injections  . Seborrhea 09/01/2010  . Small bowel obstruction due to adhesions ([place]) 02/08/2012   s/p Exploratory laparotomy, lysis of adhesions 02/12/12    . 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 Tobacco abuse 02/19/2012  . Tobacco abuse   . Vasovagal syncope 02/15/2015  . Voiding dysfunction    s/p cystoscopy and meatal dilation Dec 2005   Review of Systems:  ROS: Pulmonary: pt denies increased work of breathing, shortness of breath,  Cardiac: pt denies palpitations, chest pain,  Abdominal: pt denies abdominal pain, nausea, vomiting, or diarrhea  Physical Exam:  Vitals:   12/06/16 1042  BP: (!) 124/58  Pulse: (!) 53  Temp: 98.1 F (36.7 C)  TempSrc: Oral  SpO2: 98%  Weight: 141 lb 1.6 oz (64 kg)  Height: 5\' 8"  (1.727 m)   Physical Exam  Constitutional: She is oriented to person, place, and time. No distress.  Cardiovascular: Normal rate, regular rhythm and normal heart sounds.  Exam reveals no gallop and no friction rub.   No murmur heard. Pulmonary/Chest: Effort normal and breath sounds normal. No respiratory distress. She has no wheezes. She has no rales. She exhibits no tenderness.  Abdominal: Soft. Bowel sounds are normal. She exhibits no distension and no mass. There is no tenderness. There is no rebound and no guarding.  Neurological: She is alert and oriented to person, place,  and time.  Skin: She is not diaphoretic.    Social History   Social History  . Marital status: Widowed    Spouse name: N/A  . Number of children: 4  . Years of education: 2y college   Occupational History  . retired     previously worked as a Designer, fashion/clothing for [employer] History Main Topics  . Smoking status: Current Every Day Smoker    Packs/day: 1.00    Years: 50.00    Types: Cigarettes  . Smokeless tobacco: Never Used     Comment: 1 pkd   . Alcohol use No     Comment: "last  drink of alcohol ~ 1977"  . Drug use: No     Comment: cutting back on chantix, 1 cigarette this morning  . Sexual activity: No   Other Topics Concern  . Not on file   Social History Narrative   Lives alone in [place], [place]    Family History  Problem Relation Age of Onset  . Kidney failure Mother   . Diabetes Mother   . Hypertension Mother   . Heart disease Mother   . Glaucoma Father   . Congestive Heart Failure Sister   . Diabetes Sister   . Kidney disease Sister   . Diabetes Brother   . Unexplained death Brother 66       Automobile accident  . Hypothyroidism Daughter   . Arthritis Daughter        Neck/Back  . Healthy Son   . HIV/AIDS Brother   . HIV Daughter   . Kidney disease Daughter   . Arthritis Son        Knee    Assessment & Plan:   See Encounters Tab for problem based charting.  Patient seen with Dr. Beryle Beams

## 2016-12-06 NOTE — Assessment & Plan Note (Signed)
BP Readings from Last 3 Encounters:  12/06/16 (!) 124/58  08/25/16 (!) 150/63  06/16/16 (!) 150/69  patient's blood pressure is improved today at 124/58.  She is not endorsing any symptoms of recent low blood pressures.  Her pulse was 53 today but she is asymptomatic.  This is something to keep an eye on for now.    -Continue metoprolol tartrate 50 mg twice a day, benazepril-50 mg daily, amlodipine 10 mg daily in spironolactone 100 mg daily

## 2016-12-06 NOTE — Assessment & Plan Note (Addendum)
Patient still reporting anxiety symptoms but says the alprazolam is helping quite a bit.  Daughter corroborated that patient was recently waking her up and telling her she needed to take a shower and was feeling very anxious.  Patient took a Xanax with some relief and was able to calm herself down and go to sleep.  -Continue Xanax 1 mg 3 times a day when necessary -follow up with PCP on oct 19

## 2016-12-06 NOTE — Patient Instructions (Addendum)
Unfortunately, due to you arriving late today, we were not able to perform the shoulder injection, however we can schedule that or you can come in to [facility] clinic.  I have refilled your pain medications and we stuck with the lower dose of the fentanyl patch 30mcg.  You can follow up with your pcp to make any further changes to this regimen.

## 2016-12-07 NOTE — Progress Notes (Signed)
Medicine attending: I personally interviewed and briefly examined this patient on the day of the patient visit and reviewed pertinent clinical andlaboratory data  with resident physician Dr. Vickki Muff and we discussed a management plan.

## 2016-12-28 ENCOUNTER — Other Ambulatory Visit: Payer: Self-pay | Admitting: Internal Medicine

## 2016-12-29 ENCOUNTER — Encounter: Payer: Self-pay | Admitting: Internal Medicine

## 2016-12-29 ENCOUNTER — Ambulatory Visit (INDEPENDENT_AMBULATORY_CARE_PROVIDER_SITE_OTHER): Payer: Medicare HMO | Admitting: Internal Medicine

## 2016-12-29 VITALS — BP 138/66 | HR 66 | Temp 98.2°F | Wt 138.2 lb

## 2016-12-29 DIAGNOSIS — Z0000 Encounter for general adult medical examination without abnormal findings: Secondary | ICD-10-CM

## 2016-12-29 DIAGNOSIS — K5909 Other constipation: Secondary | ICD-10-CM

## 2016-12-29 DIAGNOSIS — Z21 Asymptomatic human immunodeficiency virus [HIV] infection status: Secondary | ICD-10-CM

## 2016-12-29 DIAGNOSIS — F411 Generalized anxiety disorder: Secondary | ICD-10-CM | POA: Diagnosis not present

## 2016-12-29 DIAGNOSIS — M75101 Unspecified rotator cuff tear or rupture of right shoulder, not specified as traumatic: Secondary | ICD-10-CM | POA: Diagnosis not present

## 2016-12-29 DIAGNOSIS — I10 Essential (primary) hypertension: Secondary | ICD-10-CM | POA: Diagnosis not present

## 2016-12-29 DIAGNOSIS — H547 Unspecified visual loss: Secondary | ICD-10-CM

## 2016-12-29 DIAGNOSIS — F4321 Adjustment disorder with depressed mood: Secondary | ICD-10-CM

## 2016-12-29 DIAGNOSIS — M5136 Other intervertebral disc degeneration, lumbar region: Secondary | ICD-10-CM

## 2016-12-29 DIAGNOSIS — Z79891 Long term (current) use of opiate analgesic: Secondary | ICD-10-CM

## 2016-12-29 DIAGNOSIS — Z79899 Other long term (current) drug therapy: Secondary | ICD-10-CM | POA: Diagnosis not present

## 2016-12-29 DIAGNOSIS — F17210 Nicotine dependence, cigarettes, uncomplicated: Secondary | ICD-10-CM | POA: Diagnosis not present

## 2016-12-29 DIAGNOSIS — R109 Unspecified abdominal pain: Secondary | ICD-10-CM | POA: Diagnosis not present

## 2016-12-29 DIAGNOSIS — R69 Illness, unspecified: Secondary | ICD-10-CM | POA: Diagnosis not present

## 2016-12-29 DIAGNOSIS — M75111 Incomplete rotator cuff tear or rupture of right shoulder, not specified as traumatic: Secondary | ICD-10-CM

## 2016-12-29 DIAGNOSIS — B20 Human immunodeficiency virus [HIV] disease: Secondary | ICD-10-CM

## 2016-12-29 DIAGNOSIS — H4010X0 Unspecified open-angle glaucoma, stage unspecified: Secondary | ICD-10-CM

## 2016-12-29 MED ORDER — HYDROCODONE-ACETAMINOPHEN 10-325 MG PO TABS: 1.0000 | ORAL_TABLET | Freq: Three times a day (TID) | ORAL | 0 refills | Status: DC | PRN

## 2016-12-29 MED ORDER — FENTANYL 25 MCG/HR TD PT72: 25.0000 ug | MEDICATED_PATCH | TRANSDERMAL | 0 refills | Status: DC

## 2016-12-29 NOTE — Assessment & Plan Note (Signed)
Assessment  She states she's had some decreased vision in both of her eyes.this is been going on for a few weeks.  Plan  She was encouraged to make her ophthalmology appointment scheduled for November 2.

## 2016-12-29 NOTE — Patient Instructions (Addendum)
It was good to see you again.  I am sorry for your nephew's illness and what you are going through right now.  1) I renewed your hydrocodone and fentanyl to get you through April 05, 2017.  You have three 1 month paper prescriptions in hand.  2) I injected your right shoulder this afternoon to help with your bursitis.  3) We did not have time to address your other medical problems, but will do so at the follow-up visit.  4) Keep taking all of your medications as you are.  I will see you back in 3 months, sooner if necessary.

## 2016-12-29 NOTE — Assessment & Plan Note (Signed)
Assessment  Her blood pressure today was well controlled at 138/66.  This is on amlodipine 10 mg by mouth daily, benazepril 40 mg by mouth daily, metoprolol 50 mg by mouth twice daily, and spironolactone 100 mg by mouth daily.  Plan  We will continue the amlodipine at 10 mg by mouth daily, benazepril at 40 mg by mouth daily, metoprolol 50 mg by mouth twice daily, and spironolactone at 100 mg by mouth daily. We will reassess the blood pressure at the follow-up visit.

## 2016-12-29 NOTE — Assessment & Plan Note (Signed)
Assessment  She has increased anxiety related to the unsuspected terminal illness in her nephew. She presented in tears today. She states that he is being transferred to hospice if a bed can be found.she asked for a diazepam pill and I told her I was uncomfortable with this given she was already on alprazolam.  Plan  We spent most of the visit discussing her grief over her nephew. We will continue with the alprazolam as needed. We will reassess her anxiety and grief at the follow-up visit.

## 2016-12-29 NOTE — Assessment & Plan Note (Signed)
She is up-to-date on her health care maintenance. 

## 2016-12-29 NOTE — Assessment & Plan Note (Signed)
Assessment  She is followed closely in the [facility] for Infectious Diseases and states she has been compliant with her anti-retroviral therapy. Plan  She will continue with her Tivicay 50 mg by mouth daily and discovy 100 mg daily and continue to follow-up with the [facility] for Infectious Diseases.

## 2016-12-29 NOTE — Assessment & Plan Note (Signed)
Assessment  She's had recurrence of her right shoulder pain attributed to her right rotator cuff tear.this is responded to steroid injections into the right shoulder in the past and she is requesting another injection.  Plan  She was given a right shoulder steroid injection during the visit.  After informed consent was obtained, a timeout was done confirming the need for a right shoulder injection in the correct patient, and after an appropriate prep, freeze spray was used to numb the lateral aspect of the right shoulder. The subacromial bursa was entered using a 27-gauge needle and injected with 40 mg of Kenalog and 1 mL of 1% lidocaine. She tolerated the procedure well without acute complications. Within 5 minutes of the procedure she noted marked subjective improvement of her right shoulder pain.

## 2016-12-29 NOTE — Assessment & Plan Note (Signed)
Assessment  Her chronic low back pain is well-controlled on the fentanyl patch 25 g/hr every 3 days and hydrocodone-acetaminophen 10 mg-325 mg 1 tablet every 8 hours as needed for pain. This regimen allows her to remain functional around the home and to ambulate outside of the home.  Plan  We will continue the fentanyl patch 25 ug/hr every 3 days and hydrocodone-acetaminophen 10 mg-325 mg 1 tablet every 8 hours as needed for pain dispense #90.  The [place] controlled substance database was reviewed and without red flags. She was given 3 hardcopy prescriptions with both narcotic medications which should get her through 04/05/2017. We will reassess the efficacy of this therapy at the follow-up visit. Ultimately, I would like to continue to decrease the total narcotic daily dose and we will readdress this issue at the follow-up visit.

## 2016-12-29 NOTE — Progress Notes (Signed)
   Subjective:    Patient ID: Jessica [name], female    DOB: Jan 28, 1941, 76 y.o.   MRN: [id number]  HPI  Jessica [name] is here for assessment of her abdominal pain likely related to her chronic constipation, right shoulder bursitis recurrence, and grief associated with her nephew's likely terminal illness. He presented to the clinic late for her scheduled appointment and thus there was not the time available to address many of her chronic medical conditions.  Please see the A&P for the status of the pt's chronic medical problems that were adressed.  Review of Systems  Constitutional: Negative for activity change and unexpected weight change.  Gastrointestinal: Positive for abdominal pain and constipation. Negative for diarrhea, nausea and vomiting.  Musculoskeletal: Positive for arthralgias and back pain. Negative for joint swelling and myalgias.  Psychiatric/Behavioral: Positive for dysphoric mood. The patient is nervous/anxious.       Objective:   Physical Exam  Constitutional: She is oriented to person, place, and time. She appears well-developed and well-nourished. She appears distressed.  Emotionally distressed with tears related to nephew's terminal illness.  HENT:  Head: Normocephalic and atraumatic.  Eyes: Conjunctivae are normal. Right eye exhibits no discharge. Left eye exhibits no discharge. No scleral icterus.  Abdominal: She exhibits no distension.  Musculoskeletal: Normal range of motion. She exhibits no edema, tenderness or deformity.  Neurological: She is alert and oriented to person, place, and time. She exhibits normal muscle tone.  Skin: Skin is warm and dry. No rash noted. She is not diaphoretic. No erythema.  Psychiatric: Her behavior is normal. Judgment and thought content normal. Her mood appears anxious. She exhibits a depressed mood.  Nursing note and vitals reviewed.     Assessment & Plan:   Please see problem oriented charting.

## 2017-01-11 ENCOUNTER — Other Ambulatory Visit: Payer: Self-pay

## 2017-01-11 ENCOUNTER — Telehealth: Payer: Self-pay | Admitting: *Deleted

## 2017-01-11 DIAGNOSIS — M5136 Other intervertebral disc degeneration, lumbar region: Secondary | ICD-10-CM

## 2017-01-11 NOTE — Telephone Encounter (Signed)
[facility] called stating they have been unable to contact patient to arrange shipment of her hiv meds. They do have correct phone number. Attempted to call patient and no answer and no voice mail. Jessica [name]

## 2017-01-11 NOTE — Telephone Encounter (Signed)
Per patient daughter HYDROcodone-acetaminophen (NORCO) 10-325 MG tablet is lost. Would like another refilled. Please call pt back.

## 2017-01-12 DIAGNOSIS — H401133 Primary open-angle glaucoma, bilateral, severe stage: Secondary | ICD-10-CM | POA: Diagnosis not present

## 2017-01-12 NOTE — Telephone Encounter (Signed)
Dr Eppie Gibson, Pt lost her pain rx-not sure of the circumstances as I've  been unable to reach pt at  numbers listed on chart. I will need to confirm if pt lost the actual prescriptions or the bottle of pills.  Female voice states pt may be on her way to [place]. You gave her 3 rxs in hand during her last visit.  As pt may show up this afternoon, what would you have me tell her regarding her lost prescription(s),  please advise.Despina Hidden Cassady11/2/201812:32 PM

## 2017-01-12 NOTE — Telephone Encounter (Signed)
Message left on recorder.Regenia Skeeter, Lum Stillinger Cassady11/2/20185:07 PM

## 2017-01-12 NOTE — Telephone Encounter (Signed)
Received call from [phone] (Pt's dtr).Marland KitchenMarland KitchenSpoke with patient- states she was at a funeral and took some items/paper out of her purse to make it lighter to carry.  She placed a rubber band around these items and placed them in the car.  Pt states she later realized the rxs were gone and is now calling for refills.  I contacted pharmacy- rx for norco (written by DrWinfrey)  was filled today and pt states she was unable to get her fentanyl because of lost rx.    Pt had a lot of going on that day and I asked that she look around the house and car for prescriptions as it was unlikely that she left them in the front seat of a car.  Pt agreed to try to locate rxs.Despina Hidden Cassady11/2/20181:32 PM

## 2017-01-12 NOTE — Telephone Encounter (Signed)
I am sorry to hear that she lost her prescriptions.  This happened once before and I informed her I would no longer give her three prescriptions in advance.  She found the prescriptions within a few hours.  If she lost them this time, I will not give her three prescriptions moving forward and she will have to pick them up monthly.  If she is unable to find them I will write for 1 month of fentanyl on Monday afternoon to match up with the prescription she received from Dr. Shan Levans.

## 2017-01-15 MED ORDER — FENTANYL 25 MCG/HR TD PT72
25.0000 ug | MEDICATED_PATCH | TRANSDERMAL | 0 refills | Status: DC
Start: 2017-02-04 — End: 2017-01-15

## 2017-01-15 MED ORDER — FENTANYL 25 MCG/HR TD PT72: 25.0000 ug | MEDICATED_PATCH | TRANSDERMAL | 0 refills | Status: DC

## 2017-01-15 NOTE — Telephone Encounter (Addendum)
Received call from pt's dtr-they were unable to locate prescriptions over the weekend and will need rx for fentanyl. Pt/Pt's dtr informed that MD will no longer be able to give her three prescriptions at a time moving forward.  Pt will await call from office to pick up fentanyl rx.Jessica [name], Jessica Belgrave Cassady11/5/201811:10 AM

## 2017-01-15 NOTE — Addendum Note (Signed)
Addended by: Oval Linsey D on: 01/15/2017 02:37 PM   Modules accepted: Orders

## 2017-01-15 NOTE — Telephone Encounter (Signed)
Patient and/or dtr will pick up rx today.Despina Hidden Cassady11/5/20182:52 PM

## 2017-01-17 ENCOUNTER — Other Ambulatory Visit: Payer: Self-pay | Admitting: Infectious Disease

## 2017-01-17 DIAGNOSIS — R21 Rash and other nonspecific skin eruption: Secondary | ICD-10-CM

## 2017-01-17 DIAGNOSIS — E785 Hyperlipidemia, unspecified: Secondary | ICD-10-CM

## 2017-01-26 DIAGNOSIS — H401133 Primary open-angle glaucoma, bilateral, severe stage: Secondary | ICD-10-CM | POA: Diagnosis not present

## 2017-02-06 ENCOUNTER — Other Ambulatory Visit: Payer: Self-pay

## 2017-02-06 DIAGNOSIS — M5136 Other intervertebral disc degeneration, lumbar region: Secondary | ICD-10-CM

## 2017-02-06 NOTE — Telephone Encounter (Signed)
Will call pt and give her 1 month fentanyl script from folder that dr Eppie Gibson did 11/5, there will then be 1 more fentanyl Hydrocodone was last picked up at pharmacy 11/2, it was filled 10/26 but not in pt's possession until 11/2, they are not holding any hydrocodone scripts for pt

## 2017-02-06 NOTE — Telephone Encounter (Signed)
fentaNYL (DURAGESIC - DOSED MCG/HR) 25 MCG/HR patch,  HYDROcodone-acetaminophen (NORCO) 10-325 MG tablet, refill request.

## 2017-02-07 NOTE — Telephone Encounter (Signed)
Tried to call again, no answer, lm for rtc

## 2017-02-07 NOTE — Telephone Encounter (Signed)
Patient calling again regarding refills

## 2017-02-09 NOTE — Telephone Encounter (Signed)
Lm for rtc 

## 2017-02-12 ENCOUNTER — Telehealth: Payer: Self-pay | Admitting: *Deleted

## 2017-02-12 NOTE — Telephone Encounter (Signed)
Pt asking about refills on Fentanyl and Hydrocodone and Xanax. Informed to call pharmacy about Hydrocodone - stated she has plenty, do not need refill now. Also informed she should have at least one more refill on Xanax. And Fentanyl rx is ready for pick up - informed of pick up hours.

## 2017-02-12 NOTE — Telephone Encounter (Signed)
Pt presented with her daughter today to pick up a script for fentanyl. Daughter stated pt had lost her 2 remaining scripts for norco at the same time she lost the fentanyl but forgot to ask for those, she then produced a med bottle dated 10/26, it contained what I assumed was 16 norco, her daughter stated her last 2 scripts needed to be replaced for the lost ones, she was concerned that she was not going to have any after today, I pointed out there were 16 in the bottle and after 7 days having 16 would mean pt did not use enough to run out for a few days, that a note would be sent to dr Eppie Gibson and as she had signed a pain contract that lost, stolen or missing scripts did not have to be replaced but that it is entirely up to dr Eppie Gibson and triage would notify her as soon as dr Eppie Gibson decided what was best for pt. Pt was agreeable

## 2017-02-14 ENCOUNTER — Other Ambulatory Visit: Payer: Self-pay | Admitting: Internal Medicine

## 2017-02-14 DIAGNOSIS — M5136 Other intervertebral disc degeneration, lumbar region: Secondary | ICD-10-CM

## 2017-02-14 MED ORDER — HYDROCODONE-ACETAMINOPHEN 10-325 MG PO TABS: 1.0000 | ORAL_TABLET | Freq: Two times a day (BID) | ORAL | 0 refills | Status: DC | PRN

## 2017-02-14 NOTE — Telephone Encounter (Signed)
No answer, no vmail 

## 2017-02-14 NOTE — Telephone Encounter (Signed)
I checked the [place] (including [place] and [place]) and confirmed the last refill was on 01/05/2017 for #90 tablets of hydrocodone-acetaminophen 10-325 mg.  If she had #16 left, that meant she went through 74 tablets in 38 days, or roughly 2/day.  Although this is a contract violation, I will take this opportunity to readjust her #/month to 60.  We will continue to work on weaning as tolerated from there.

## 2017-02-16 ENCOUNTER — Other Ambulatory Visit: Payer: Self-pay | Admitting: Internal Medicine

## 2017-02-16 NOTE — Telephone Encounter (Signed)
Patient calling back checking on the status on medicine

## 2017-02-16 NOTE — Telephone Encounter (Signed)
Patient calling back, regarding medicine refills

## 2017-02-16 NOTE — Telephone Encounter (Signed)
Received message from front office that pt's dtr returning call-Pt is currently in [place] and will pick up rx on Wed. 12/12.Despina Hidden Cassady12/7/20183:35 PM

## 2017-02-16 NOTE — Telephone Encounter (Signed)
Called back no answer, no vmail

## 2017-02-16 NOTE — Telephone Encounter (Signed)
Message left on recorder that rx is ready for pickup and the office will be closed on Monday and Tues morning due to inclement weather.Despina Hidden Cassady12/7/20182:22 PM

## 2017-02-18 ENCOUNTER — Other Ambulatory Visit: Payer: Self-pay | Admitting: Infectious Disease

## 2017-02-18 DIAGNOSIS — K219 Gastro-esophageal reflux disease without esophagitis: Secondary | ICD-10-CM

## 2017-02-26 ENCOUNTER — Other Ambulatory Visit: Payer: Medicare HMO

## 2017-03-08 ENCOUNTER — Other Ambulatory Visit: Payer: Self-pay | Admitting: Infectious Disease

## 2017-03-08 ENCOUNTER — Other Ambulatory Visit: Payer: Self-pay | Admitting: *Deleted

## 2017-03-08 DIAGNOSIS — B20 Human immunodeficiency virus [HIV] disease: Secondary | ICD-10-CM

## 2017-03-08 DIAGNOSIS — B029 Zoster without complications: Secondary | ICD-10-CM

## 2017-03-08 MED ORDER — ACYCLOVIR 400 MG PO TABS: ORAL_TABLET | ORAL | 1 refills | Status: DC

## 2017-03-08 MED ORDER — DOLUTEGRAVIR SODIUM 50 MG PO TABS: 50.0000 mg | ORAL_TABLET | Freq: Every day | ORAL | 11 refills | Status: DC

## 2017-03-08 MED ORDER — EMTRICITABINE-TENOFOVIR AF 200-25 MG PO TABS: 1.0000 | ORAL_TABLET | Freq: Every day | ORAL | 0 refills | Status: DC

## 2017-03-16 ENCOUNTER — Ambulatory Visit (INDEPENDENT_AMBULATORY_CARE_PROVIDER_SITE_OTHER): Payer: Medicare HMO | Admitting: Internal Medicine

## 2017-03-16 ENCOUNTER — Encounter: Payer: Self-pay | Admitting: Internal Medicine

## 2017-03-16 ENCOUNTER — Other Ambulatory Visit: Payer: Self-pay

## 2017-03-16 ENCOUNTER — Other Ambulatory Visit (HOSPITAL_COMMUNITY)
Admission: RE | Admit: 2017-03-16 | Discharge: 2017-03-16 | Disposition: A | Discharge status: Home / Self Care | Payer: Medicare HMO | Source: Ambulatory Visit | Attending: Internal Medicine | Admitting: Internal Medicine

## 2017-03-16 VITALS — BP 153/69 | HR 65 | Temp 98.2°F | Wt 134.3 lb

## 2017-03-16 DIAGNOSIS — M5136 Other intervertebral disc degeneration, lumbar region: Secondary | ICD-10-CM

## 2017-03-16 DIAGNOSIS — M545 Low back pain: Secondary | ICD-10-CM | POA: Diagnosis not present

## 2017-03-16 DIAGNOSIS — F411 Generalized anxiety disorder: Secondary | ICD-10-CM | POA: Diagnosis not present

## 2017-03-16 DIAGNOSIS — B20 Human immunodeficiency virus [HIV] disease: Secondary | ICD-10-CM | POA: Diagnosis present

## 2017-03-16 DIAGNOSIS — E7800 Pure hypercholesterolemia, unspecified: Secondary | ICD-10-CM

## 2017-03-16 DIAGNOSIS — E785 Hyperlipidemia, unspecified: Secondary | ICD-10-CM | POA: Diagnosis not present

## 2017-03-16 DIAGNOSIS — H409 Unspecified glaucoma: Secondary | ICD-10-CM

## 2017-03-16 DIAGNOSIS — I10 Essential (primary) hypertension: Secondary | ICD-10-CM

## 2017-03-16 DIAGNOSIS — G8929 Other chronic pain: Secondary | ICD-10-CM | POA: Diagnosis not present

## 2017-03-16 DIAGNOSIS — Z79899 Other long term (current) drug therapy: Secondary | ICD-10-CM

## 2017-03-16 DIAGNOSIS — M7551 Bursitis of right shoulder: Secondary | ICD-10-CM

## 2017-03-16 DIAGNOSIS — Z79891 Long term (current) use of opiate analgesic: Secondary | ICD-10-CM

## 2017-03-16 DIAGNOSIS — H4089 Other specified glaucoma: Secondary | ICD-10-CM

## 2017-03-16 DIAGNOSIS — M75101 Unspecified rotator cuff tear or rupture of right shoulder, not specified as traumatic: Secondary | ICD-10-CM | POA: Diagnosis not present

## 2017-03-16 DIAGNOSIS — K5900 Constipation, unspecified: Secondary | ICD-10-CM | POA: Diagnosis not present

## 2017-03-16 DIAGNOSIS — R69 Illness, unspecified: Secondary | ICD-10-CM | POA: Diagnosis not present

## 2017-03-16 DIAGNOSIS — F17210 Nicotine dependence, cigarettes, uncomplicated: Secondary | ICD-10-CM | POA: Diagnosis not present

## 2017-03-16 DIAGNOSIS — Z720 Tobacco use: Secondary | ICD-10-CM

## 2017-03-16 MED ORDER — HYDROCODONE-ACETAMINOPHEN 10-325 MG PO TABS: 1.0000 | ORAL_TABLET | Freq: Two times a day (BID) | ORAL | 0 refills | Status: DC | PRN

## 2017-03-16 MED ORDER — ATENOLOL 100 MG PO TABS: 100.0000 mg | ORAL_TABLET | Freq: Every day | ORAL | 3 refills | Status: DC

## 2017-03-16 MED ORDER — FENTANYL 25 MCG/HR TD PT72: 25.0000 ug | MEDICATED_PATCH | TRANSDERMAL | 0 refills | Status: DC

## 2017-03-16 NOTE — Assessment & Plan Note (Signed)
Assessment  She is followed by the [facility] for Infectious Diseases on a yearly basis. She states she is compliant with her medications and is without any complaints or intolerances. She is scheduled to see Dr. Drucilla Schmidt of infectious disease within the next 2 weeks. She was scheduled for a lab appointment, but was unable to make it secondary to a family emergency.  Plan  She was encouraged to follow-up with her infectious disease specialist within the next 2 weeks as scheduled. In the meantime, she was asked to continue with her current anti-retroviral regimen. Although I was unsure of the labs that Dr. Drucilla Schmidt had ordered I reviewed his previous note and ordered the labs that he had obtained the year before, assuming they would be unchanged. They are pending at this time and will be ready for Dr. Arlyss Queen visit.

## 2017-03-16 NOTE — Assessment & Plan Note (Signed)
She is currently up-to-date on her healthcare maintenance. 

## 2017-03-16 NOTE — Assessment & Plan Note (Signed)
Assessment  She has a right rotator cuff tear with occasional flares and possible bursitis. At the last clinic visit she had an acute flare and was given a steroid shot in the right shoulder. After this, her pain immediately resolved. She continues to deny any right shoulder pain since.  Plan  We will continue to monitor her symptoms clinically and if she has a another flare of right shoulder pain we will consider repeat right shoulder injection.

## 2017-03-16 NOTE — Assessment & Plan Note (Signed)
Assessment  She has glaucoma of the left eye and is followed by an ophthalmologist. I do not have access to the ophthalmologist notes. She states that the vision in her left eye has continued to decrease. She has been seen by her ophthalmologist and they are planning surgery later this month.  Plan  I encouraged her to follow through with any surgical procedure and or medical regimen her ophthalmologist recommends. I am hopeful we will receive progress notes related to this most recent intervention.

## 2017-03-16 NOTE — Assessment & Plan Note (Signed)
Assessment  We once again discussed the importance of tobacco cessation for her health. Once again, she remains in the pre-contemplative stage.  Plan  At each visit we will continue to discuss the importance of tobacco cessation on her overall health. When she is interested we will provide any support that she would like to assist with cessation.

## 2017-03-16 NOTE — Patient Instructions (Signed)
It was good to see you again.  I am sorry to hear about all of the family tragedies you have experienced recently.  1) I changed your metoprolol to atenolol for blood pressure.  This is 100 mg (1 tablet) daily.  2) Keep taking all of the other medications as you are.  3) I drew all of the labs I thought Dr. Tommy Medal would want.  I will see you back in 3 months, sooner if necessary.

## 2017-03-16 NOTE — Assessment & Plan Note (Signed)
Assessment  She is tolerating the atorvastatin at 10 mg by mouth daily without myalgias.  Plan  We will continue with this moderate intensity statin and reassess for intolerances at the follow-up visit.

## 2017-03-16 NOTE — Progress Notes (Signed)
   Subjective:    Patient ID: Jessica [name], female    DOB: 1941-01-27, 77 y.o.   MRN: [id number]  HPI  Jessica [name] is here for follow-up of her essential hypertension, lumbar degenerative disc disease with chronic back pain, generalized anxiety, tobacco abuse, hyperlipidemia, right shoulder bursitis, and HIV disease. Please see the A&P for the status of the pt's chronic medical problems.  Review of Systems  Constitutional: Negative for unexpected weight change.  Eyes: Positive for visual disturbance.       Unilateral decrease in vision reportedly undergoing a surgical intervention for glaucoma on 03/23/2017.  Respiratory: Negative for chest tightness and shortness of breath.   Cardiovascular: Negative for chest pain and leg swelling.  Gastrointestinal: Positive for constipation. Negative for abdominal pain, diarrhea, nausea and vomiting.  Genitourinary: Negative for difficulty urinating.  Musculoskeletal: Positive for back pain. Negative for joint swelling and myalgias.  Skin: Negative for color change, rash and wound.  Psychiatric/Behavioral: Positive for dysphoric mood.       Dysphoric mood secondary to recent deaths in the family      Objective:   Physical Exam  Constitutional: She is oriented to person, place, and time. She appears well-developed and well-nourished. No distress.  HENT:  Head: Normocephalic and atraumatic.  Eyes: Conjunctivae are normal. Right eye exhibits no discharge. Left eye exhibits no discharge. No scleral icterus.  Musculoskeletal: Normal range of motion. She exhibits no edema, tenderness or deformity.  Neurological: She is alert and oriented to person, place, and time. She exhibits normal muscle tone.  Skin: Skin is warm and dry. No rash noted. She is not diaphoretic. No erythema.  Psychiatric: She has a normal mood and affect. Her behavior is normal. Judgment and thought content normal.  Nursing note and vitals reviewed.       Assessment & Plan:   Please see problem oriented charting.

## 2017-03-16 NOTE — Assessment & Plan Note (Signed)
Assessment  Her back pain is relatively well controlled today. Her current regimen includes hydrocodone-acetaminophen 10 mg-325 mg 1 tablet every 12 hours as needed for pain and fentanyl patch 25 g per hour every 3 days. She no longer requires the baclofen and has not been taking it. She will take an occasional ibuprofen as well. With this regimen, her pain is controlled so that she can ambulate around the community and fulfill her necessary functions.  Plan  We will continue the hydrocodone-acetaminophen 10 mg-325 mg 1 tablet every 12 hours as needed. The most recent prescription was for 60 tablets for the month. As I feel she is on an excessive amount of opiates we will continue to wean the hydrocodone-acetaminophen and she will be provided 55 tablets per month. Refills were provided to get her through early March. We will also continue the fentanyl patch 25 g per hour every 3 days. In order to realign her narcotic prescriptions to be picked up on the same day a prescription was sent to the pharmacy for 2 patches. An additional 2 prescriptions for fentanyl patches #10 per month were also sent to the pharmacy and aligned with fill dates consistent with the hydrocodone-acetaminophen prescription. We will reassess the efficacy of this therapy in managing her pain at the follow-up visit. If she continues to do well we will continue the slow wean of the hydrocodone-acetaminophen.

## 2017-03-16 NOTE — Assessment & Plan Note (Signed)
Assessment  Her anxiety is reasonably well controlled given that she had multiple deaths within her family over the last several months. She is currently on alprazolam 1 mg every 8 hours as needed for anxiety. She receives 85 tablets per month. Of note, on her last UDS in April there was no alprazolam or metabolites in the urine. This is not necessarily surprising as this is a as needed medication.  Plan  We will continue the alprazolam, and when it is due for another prescription it will be changed to #80 per month as we continue to wean down the benzodiazepines as well. We will reassess her anxiety at the follow-up visit while on this regimen.

## 2017-03-16 NOTE — Assessment & Plan Note (Signed)
Assessment  Her blood pressure today was elevated at 153/69. When we reviewed her medications it was discovered that she was taking the metoprolol only once a day rather than as prescribed as twice a day. She was taking her other medications including amlodipine 10 mg by mouth daily, benazepril 40 mg by mouth daily, and spironolactone 100 mg by mouth daily.  Plan  We will continue the amlodipine at 10 mg by mouth daily, benazepril at 40 mg by mouth daily, and spironolactone at 100 mg by mouth daily. We have discontinued the metoprolol and will restart atenolol 100 mg by mouth daily. It is hoped that this will improve compliance with the beta blocker as this also is a once daily medication. We will reassess the blood pressure at the follow-up visit on this modified regimen.

## 2017-03-17 LAB — CBC WITH DIFFERENTIAL/PLATELET
BASOS: 1 %
Basophils Absolute: 0.1 10*3/uL (ref 0.0–0.2)
EOS (ABSOLUTE): 0.2 10*3/uL (ref 0.0–0.4)
Eos: 2 %
Hematocrit: 38.3 % (ref 34.0–46.6)
Hemoglobin: 13 g/dL (ref 11.1–15.9)
Immature Grans (Abs): 0 10*3/uL (ref 0.0–0.1)
Immature Granulocytes: 0 %
Lymphocytes Absolute: 4.9 10*3/uL — ABNORMAL HIGH (ref 0.7–3.1)
Lymphs: 53 %
MCH: 33.2 pg — AB (ref 26.6–33.0)
MCHC: 33.9 g/dL (ref 31.5–35.7)
MCV: 98 fL — AB (ref 79–97)
MONOS ABS: 0.4 10*3/uL (ref 0.1–0.9)
Monocytes: 4 %
NEUTROS ABS: 3.7 10*3/uL (ref 1.4–7.0)
Neutrophils: 40 %
PLATELETS: 291 10*3/uL (ref 150–379)
RBC: 3.92 x10E6/uL (ref 3.77–5.28)
RDW: 14.3 % (ref 12.3–15.4)
WBC: 9.2 10*3/uL (ref 3.4–10.8)

## 2017-03-17 LAB — CMP14 + ANION GAP
A/G RATIO: 1.3 (ref 1.2–2.2)
ALK PHOS: 49 IU/L (ref 39–117)
ALT: 13 IU/L (ref 0–32)
AST: 20 IU/L (ref 0–40)
Albumin: 4.3 g/dL (ref 3.5–4.8)
Anion Gap: 19 mmol/L — ABNORMAL HIGH (ref 10.0–18.0)
BILIRUBIN TOTAL: 0.3 mg/dL (ref 0.0–1.2)
BUN/Creatinine Ratio: 10 — ABNORMAL LOW (ref 12–28)
BUN: 10 mg/dL (ref 8–27)
CO2: 17 mmol/L — ABNORMAL LOW (ref 20–29)
Calcium: 10.1 mg/dL (ref 8.7–10.3)
Chloride: 106 mmol/L (ref 96–106)
Creatinine, Ser: 0.96 mg/dL (ref 0.57–1.00)
GFR calc Af Amer: 67 mL/min/{1.73_m2} (ref >59)
GFR calc non Af Amer: 58 mL/min/{1.73_m2} — ABNORMAL LOW (ref >59)
GLOBULIN, TOTAL: 3.3 g/dL (ref 1.5–4.5)
Glucose: 78 mg/dL (ref 65–99)
POTASSIUM: 4.3 mmol/L (ref 3.5–5.2)
SODIUM: 142 mmol/L (ref 134–144)
Total Protein: 7.6 g/dL (ref 6.0–8.5)

## 2017-03-17 LAB — RPR: RPR: NONREACTIVE

## 2017-03-18 LAB — HIV-1 RNA ULTRAQUANT REFLEX TO GENTYP+
HIV1 RNA # SERPL PCR: 290 {copies}/mL
HIV1 RNA Plas PCR-Log#: 2.462 log10copy/mL

## 2017-03-19 ENCOUNTER — Ambulatory Visit (INDEPENDENT_AMBULATORY_CARE_PROVIDER_SITE_OTHER): Payer: Medicare HMO | Admitting: Infectious Disease

## 2017-03-19 ENCOUNTER — Encounter: Payer: Self-pay | Admitting: Infectious Disease

## 2017-03-19 VITALS — BP 166/79 | HR 56 | Temp 98.0°F | Ht 68.0 in | Wt 134.0 lb

## 2017-03-19 DIAGNOSIS — R69 Illness, unspecified: Secondary | ICD-10-CM | POA: Diagnosis not present

## 2017-03-19 DIAGNOSIS — H409 Unspecified glaucoma: Secondary | ICD-10-CM

## 2017-03-19 DIAGNOSIS — Z113 Encounter for screening for infections with a predominantly sexual mode of transmission: Secondary | ICD-10-CM | POA: Diagnosis not present

## 2017-03-19 DIAGNOSIS — Z720 Tobacco use: Secondary | ICD-10-CM | POA: Diagnosis not present

## 2017-03-19 DIAGNOSIS — B20 Human immunodeficiency virus [HIV] disease: Secondary | ICD-10-CM | POA: Diagnosis not present

## 2017-03-19 DIAGNOSIS — Z79899 Other long term (current) drug therapy: Secondary | ICD-10-CM | POA: Diagnosis not present

## 2017-03-19 MED ORDER — BICTEGRAVIR-EMTRICITAB-TENOFOV 50-200-25 MG PO TABS
1.0000 | ORAL_TABLET | Freq: Every day | ORAL | 11 refills | Status: DC
Start: 2017-03-19 — End: 2017-10-02

## 2017-03-19 NOTE — Progress Notes (Signed)
Chief complaint: Follow-up for HIV on medication having had viral load blip up and recently having some memory problems   Subjective:    Patient ID: Jessica [name], female    DOB: May 01, 1940, 77 y.o.   MRN: [id number]  HPI  Jessica [name] is a 77 y.o. female with HIV infection who is doing superbly well on her antiviral regimen, changed from Atripla to [facility] and [place] with undetectable viral load and health cd4 count, though her VL when checked in [facility] was now 290  Lab Results  Component Value Date   HIV1RNAQUANT <20 03/01/2016   HIV1RNAQUANT <20 09/06/2015   HIV1RNAQUANT <20 03/16/2015     Lab Results  Component Value Date   CD4TABS 2,060 03/01/2016   CD4TABS 1,990 09/06/2015   CD4TABS 2,300 03/16/2015    Sge and her daughter are emphatic  That she has not missed any doses of her medicines.  Suspect that her viral load may be falsely elevated since the blood was drawn in the internal medicine clinic and previously when our clinic was in that space we discovered that blood was being drawn but not promptly centrifuged and frozen leading to chance of virus replicating in test tubes left out at room temperature. I suspect that this occurred again. Regardless we will get a new VL today.  Did also start on calcium tablets twice daily and these drugs do chelate the DTG (Tivicay). When pts take calcium, magnesium, iron or MVI with DTG they need to space these medications out from one another or take a FULL MEAL with the meds to overcome the chelation risk.      Past Medical History:  Diagnosis Date  . Anxiety 04/05/2012  . Bilateral sensorineural hearing loss 06/11/2014   Mild to moderate on the left side and slight to mild on the right side per audiometry 05/2014.  Hearing aides with possible masking of tinnitus recommended but patient wished to defer secondary to finances.  . Blood transfusion without reported diagnosis    pt denies  . Bursitis of right shoulder  07/12/2012   s/p shoulder injection 07/12/2012   . Cataract of right eye   . Constipation due to pain medication 04/27/2010  . Diverticulosis 02/08/2012   Extensive left-sided diverticula on colonoscopy March 2012 per Dr. Gala Romney   . Essential hypertension 07/20/2006  . Genital herpes 07/20/2006  . 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 Heart murmur 1961  . Human immunodeficiency virus disease ([place]) 03/27/1986  . Hyperlipidemia LDL goal < 100 04/05/2012  . Long-term current use of opiate analgesic 03/17/2016  . Lumbar degenerative disc disease 07/20/2006   With chronic back pain   . Marijuana use 07/03/2016  . Micturition syncope 09/20/2015  . 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[place]) 11/01/2011   s/p aortobifem bypass 2009   . Periumbilical hernia 07/19/5275   1 cm left periumbilical abdominal wall defect  . 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 Right rotator cuff tear 02/01/2013   Responds to periodic steroid injections  . Seborrhea 09/01/2010  . Small bowel obstruction due to adhesions ([place]) 02/08/2012   s/p Exploratory laparotomy, lysis of adhesions 02/12/12    . 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 Tobacco abuse 02/19/2012  . Tobacco abuse   . Vasovagal syncope 02/15/2015  . Voiding dysfunction    s/p cystoscopy and meatal dilation Dec 2005    Past Surgical History:  Procedure Laterality  Date  . ABDOMINAL HYSTERECTOMY    . AORTO-FEMORAL BYPASS GRAFT  04/2007  . APPENDECTOMY    . BREAST SURGERY     Breast biopsy: negative  . CHOLECYSTECTOMY    . COLECTOMY  01/2011   Dr. Margot Chimes; "took out 12 inches of small intestiines and removed blockage"  . EYE SURGERY    . LAPAROTOMY  02/12/2012   Procedure: EXPLORATORY LAPAROTOMY;  Surgeon: Stark Klein, MD;  Location: [facility] OR;  Service: General;  Laterality: N/A;  Exploratory Laparotomy, lysis of adhesions  . SMALL INTESTINE SURGERY      Family History  Problem Relation Age of Onset  .  Kidney failure Mother   . Diabetes Mother   . Hypertension Mother   . Heart disease Mother   . Glaucoma Father   . Congestive Heart Failure Sister   . Diabetes Sister   . Kidney disease Sister   . Diabetes Brother   . Unexplained death Brother 59       Automobile accident  . Hypothyroidism Daughter   . Arthritis Daughter        Neck/Back  . Healthy Son   . HIV/AIDS Brother   . HIV Daughter   . Kidney disease Daughter   . Arthritis Son        Knee      Social History   Socioeconomic History  . Marital status: Widowed    Spouse name: None  . Number of children: 4  . Years of education: 2y college  . Highest education level: None  Social Needs  . Financial resource strain: None  . Food insecurity - worry: None  . Food insecurity - inability: None  . Transportation needs - medical: None  . Transportation needs - non-medical: None  Occupational History  . 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 Smoking status: Current Every Day Smoker    Packs/day: 1.00    Years: 50.00    Pack years: 50.00    Types: Cigarettes  . Smokeless tobacco: Never Used  . Tobacco comment: 1 pkd   Substance and Sexual Activity  . Alcohol use: No    Alcohol/week: 0.0 oz    Comment: "last drink of alcohol ~ 1977"  . Drug use: No    Comment: cutting back on chantix, 1 cigarette this morning  . Sexual activity: No    Partners: Male  Other Topics Concern  . None  Social History Narrative   Lives alone in [place], [place]    Allergies  Allergen Reactions  . Hctz [Hydrochlorothiazide] Other (See Comments)    Dizziness, syncope. Does not wish to take anymore     Current Outpatient Medications:  .  acyclovir (ZOVIRAX) 400 MG tablet, TAKE 1 TABLET BY MOUTH THREE TIMES DAILY AS NEEDED FOR OUTBREAKS FOR 7 DAYS, Disp: 21 tablet, Rfl: 1 .  alendronate (FOSAMAX) 70 MG tablet, TAKE 1 TABLET BY MOUTH ONCE EVERY WEEK, Disp: 12 tablet, Rfl: 3 .   ALPRAZolam (XANAX) 1 MG tablet, Take 1 tablet (1 mg total) by mouth 3 (three) times daily as needed for anxiety., Disp: 85 tablet, Rfl: 3 .  amLODipine (NORVASC) 10 MG tablet, Take 1 tablet (10 mg total) by mouth daily., Disp: 90 tablet, Rfl: 3 .  aspirin 81 MG EC tablet, Take 81 mg by mouth daily.  , Disp: , Rfl:  .  atenolol (TENORMIN) 100 MG tablet, Take 1 tablet (100 mg total)  by mouth daily., Disp: 90 tablet, Rfl: 3 .  atorvastatin (LIPITOR) 10 MG tablet, TAKE 1 TABLET BY MOUTH EVERY DAY (STOP LOVASTATIN), Disp: 30 tablet, Rfl: 11 .  benazepril (LOTENSIN) 40 MG tablet, Take 1 tablet (40 mg total) by mouth daily., Disp: 90 tablet, Rfl: 3 .  calcium-vitamin D (OSCAL WITH D) 500-200 MG-UNIT per tablet, Take 1 tablet by mouth 2 (two) times daily., Disp: , Rfl:  .  COMBIGAN 0.2-0.5 % ophthalmic solution, Place 1 drop into the left eye 2 (two) times daily., Disp: , Rfl:  .  DESCOVY 200-25 MG tablet, TAKE 1 TABLET BY MOUTH DAILY, Disp: 30 tablet, Rfl: 0 .  docusate sodium (COLACE) 100 MG capsule, Take 100 mg by mouth 2 (two) times daily., Disp: , Rfl:  .  dolutegravir (TIVICAY) 50 MG tablet, Take 1 tablet (50 mg total) by mouth daily., Disp: 30 tablet, Rfl: 11 .  emtricitabine-tenofovir AF (DESCOVY) 200-25 MG tablet, Take 1 tablet by mouth daily., Disp: 30 tablet, Rfl: 0 .  fentaNYL (DURAGESIC - DOSED MCG/HR) 25 MCG/HR patch, Place 1 patch (25 mcg total) onto the skin every 3 (three) days., Disp: 10 patch, Rfl: 0 .  HYDROcodone-acetaminophen (NORCO) 10-325 MG tablet, Take 1 tablet by mouth every 12 (twelve) hours as needed for severe pain., Disp: 55 tablet, Rfl: 0 .  ibuprofen (ADVIL,MOTRIN) 200 MG tablet, Take 400 mg by mouth 3 (three) times daily as needed for pain., Disp: , Rfl:  .  Multiple Vitamin (MULTIVITAMIN WITH MINERALS) TABS, Take 1 tablet by mouth daily., Disp: , Rfl:  .  Naloxone HCl (EVZIO) 0.4 MG/0.4ML SOAJ, Inject 0.4 mg as directed once as needed. For overdose and dial 911. May give  another dose if needed., Disp: 0.4 mL, Rfl: 0 .  pantoprazole (PROTONIX) 40 MG tablet, TAKE 1 TABLET BY MOUTH EVERY DAY, Disp: 30 tablet, Rfl: 0 .  sorbitol 70 % solution, Take 15 mLs by mouth daily as needed., Disp: 473 mL, Rfl: 0 .  spironolactone (ALDACTONE) 100 MG tablet, Take 1 tablet (100 mg total) by mouth daily., Disp: 90 tablet, Rfl: 3 .  TIVICAY 50 MG tablet, TAKE 1 TABLET BY MOUTH EVERY DAY, Disp: 30 tablet, Rfl: 0 .  triamcinolone ointment (KENALOG) 0.1 %, APPLY TO AFFECTED AREA TWICE DAILY, Disp: 454 g, Rfl: 3        Review of Systems  Constitutional: Negative for activity change, appetite change, chills, diaphoresis, fatigue and unexpected weight change.  HENT: Negative for congestion, ear pain, rhinorrhea, sinus pressure, sneezing, sore throat and trouble swallowing.   Eyes: Negative for photophobia and visual disturbance.  Respiratory: Negative for cough, chest tightness, shortness of breath, wheezing and stridor.   Cardiovascular: Negative for palpitations and leg swelling.  Gastrointestinal: Negative for abdominal distention, anal bleeding, blood in stool, constipation, diarrhea, nausea and vomiting.  Genitourinary: Negative for difficulty urinating, flank pain and hematuria.  Musculoskeletal: Negative for arthralgias, gait problem, joint swelling and myalgias.  Skin: Negative for color change, pallor and wound.  Neurological: Negative for dizziness, tremors and light-headedness.  Hematological: Negative for adenopathy. Does not bruise/bleed easily.  Psychiatric/Behavioral: Negative for agitation, behavioral problems, confusion, decreased concentration, dysphoric mood and sleep disturbance.        Objective:   Physical Exam  Constitutional: She is oriented to person, place, and time. She appears well-developed and well-nourished. No distress.  HENT:  Head: Normocephalic and atraumatic.  Mouth/Throat: No oropharyngeal exudate.  Eyes: Conjunctivae and EOM are  normal. No scleral icterus.  Neck: Normal range of motion. Neck supple.  Cardiovascular: Normal rate and regular rhythm.  Pulmonary/Chest: Effort normal. No respiratory distress. She has no wheezes.  Abdominal: She exhibits no distension.  Musculoskeletal: She exhibits no edema or tenderness.  Neurological: She is alert and oriented to person, place, and time. She exhibits normal muscle tone. Coordination normal.  Skin: Skin is warm and dry. No rash noted. She is not diaphoretic. No erythema. No pallor.  Psychiatric: Her behavior is normal. Judgment and thought content normal. Her mood appears anxious.  Nursing note and vitals reviewed.         Assessment & Plan:     HIV:  Recheck labs today. She did not renew SPAP on time so may have been paying more out of pocket for her ARV. We will make sure she renews today  Check VL today  Change to BIKTARVY to have STR  AVOID TAKING CALCIUm, mag, MVI at same time as HIV meds UNLESS she takes a full meal  RTC in 2 months to see me and for repeat labs  HTN being managed by Dr. Eppie Gibson.   Hyperlipidemia: Continue Lipitor   Memory problems: appear mild so far but will monitor. Because she is already on an INSTI she cannot be candidaet for our Neurocognitive study with ACTG  Smoking: she had 2 packs of [employer] in her purse so I suspect this is still going to be a difficult process. I counseled her again on importance of stopping cigarettes   I spent greater than 25 minutes with the patient including greater than 50% of time in face to face counsel of the patient and daughter regarding the nature of how viral loads can be falsely elevated in the context of lab error in particular with regards to not centrifuging and freezing specimen on time other things that could interfere with her antiretrovirals in particular the calcium pills that have been taken with her antiretrovirals an empty stomach and how she should not do this but space out the  calcium pills from her antiretrovirals or take a full meal with her antiretrovirals, detailing her new regimen and change from old regimen to new regimen  and in coordination of her  care.

## 2017-03-19 NOTE — Patient Instructions (Signed)
Your new medication is BIKTARVY  Once a day  This takes the place of Tivicay and Descovy  Do NOT take a multivitamin or iron or calcium pill at the same time as this medication UNLESS you eat a full meal with the meds  RTC in 2 months we will also check a lab today

## 2017-03-21 LAB — T-HELPER CELL (CD4) - (RCID CLINIC ONLY)
CD4 % Helper T Cell: 38 % (ref 33–55)
CD4 T CELL ABS: 2240 /uL (ref 400–2700)

## 2017-03-22 LAB — HIV RNA, RTPCR W/R GT (RTI, PI,INT)
HIV 1 RNA Quant: <20 copies/mL
HIV-1 RNA QUANT, LOG: NOT DETECTED {Log_copies}/mL

## 2017-03-23 DIAGNOSIS — H401123 Primary open-angle glaucoma, left eye, severe stage: Secondary | ICD-10-CM | POA: Diagnosis not present

## 2017-03-29 ENCOUNTER — Other Ambulatory Visit: Payer: Self-pay | Admitting: Infectious Disease

## 2017-03-29 ENCOUNTER — Other Ambulatory Visit: Payer: Self-pay | Admitting: Internal Medicine

## 2017-03-29 ENCOUNTER — Encounter: Payer: Self-pay | Admitting: Infectious Disease

## 2017-03-29 ENCOUNTER — Other Ambulatory Visit: Payer: Self-pay | Admitting: *Deleted

## 2017-03-29 DIAGNOSIS — K219 Gastro-esophageal reflux disease without esophagitis: Secondary | ICD-10-CM

## 2017-03-29 DIAGNOSIS — E785 Hyperlipidemia, unspecified: Secondary | ICD-10-CM

## 2017-03-29 DIAGNOSIS — B029 Zoster without complications: Secondary | ICD-10-CM

## 2017-03-29 DIAGNOSIS — B20 Human immunodeficiency virus [HIV] disease: Secondary | ICD-10-CM

## 2017-03-29 MED ORDER — ATORVASTATIN CALCIUM 10 MG PO TABS: ORAL_TABLET | ORAL | 11 refills | Status: DC

## 2017-03-29 MED ORDER — ACYCLOVIR 400 MG PO TABS: ORAL_TABLET | ORAL | 5 refills | Status: DC

## 2017-04-06 DIAGNOSIS — H401133 Primary open-angle glaucoma, bilateral, severe stage: Secondary | ICD-10-CM | POA: Diagnosis not present

## 2017-04-20 ENCOUNTER — Other Ambulatory Visit: Payer: Self-pay | Admitting: *Deleted

## 2017-04-20 DIAGNOSIS — F411 Generalized anxiety disorder: Secondary | ICD-10-CM

## 2017-04-20 MED ORDER — ALPRAZOLAM 1 MG PO TABS: 1.0000 mg | ORAL_TABLET | Freq: Three times a day (TID) | ORAL | 3 refills | Status: DC | PRN

## 2017-04-20 NOTE — Telephone Encounter (Signed)
Database reviewed.  Prescription renewed at #80/month with 3 refills.  Upon renewal will reassess for further titration downward to #75/month.

## 2017-05-07 ENCOUNTER — Other Ambulatory Visit: Payer: Medicare HMO

## 2017-05-11 ENCOUNTER — Other Ambulatory Visit: Payer: Medicare HMO

## 2017-05-11 DIAGNOSIS — H409 Unspecified glaucoma: Secondary | ICD-10-CM

## 2017-05-11 DIAGNOSIS — B20 Human immunodeficiency virus [HIV] disease: Secondary | ICD-10-CM

## 2017-05-11 DIAGNOSIS — Z113 Encounter for screening for infections with a predominantly sexual mode of transmission: Secondary | ICD-10-CM | POA: Diagnosis not present

## 2017-05-11 DIAGNOSIS — R69 Illness, unspecified: Secondary | ICD-10-CM | POA: Diagnosis not present

## 2017-05-11 DIAGNOSIS — Z79899 Other long term (current) drug therapy: Secondary | ICD-10-CM

## 2017-05-11 DIAGNOSIS — Z720 Tobacco use: Secondary | ICD-10-CM | POA: Diagnosis not present

## 2017-05-11 LAB — T-HELPER CELL (CD4) - (RCID CLINIC ONLY)
CD4 T CELL ABS: 1710 /uL (ref 400–2700)
CD4 T CELL HELPER: 34 % (ref 33–55)

## 2017-05-14 LAB — CBC WITH DIFFERENTIAL/PLATELET
BASOS ABS: 37 {cells}/uL (ref 0–200)
Basophils Relative: 0.4 %
EOS PCT: 0.9 %
Eosinophils Absolute: 83 cells/uL (ref 15–500)
HCT: 36.6 % (ref 35.0–45.0)
HEMOGLOBIN: 12.7 g/dL (ref 11.7–15.5)
Lymphs Abs: 4894 cells/uL — ABNORMAL HIGH (ref 850–3900)
MCH: 33.1 pg — ABNORMAL HIGH (ref 27.0–33.0)
MCHC: 34.7 g/dL (ref 32.0–36.0)
MCV: 95.3 fL (ref 80.0–100.0)
MONOS PCT: 3.4 %
MPV: 9.5 fL (ref 7.5–12.5)
NEUTROS ABS: 3873 {cells}/uL (ref 1500–7800)
Neutrophils Relative %: 42.1 %
Platelets: 342 10*3/uL (ref 140–400)
RBC: 3.84 10*6/uL (ref 3.80–5.10)
RDW: 12.7 % (ref 11.0–15.0)
Total Lymphocyte: 53.2 %
WBC mixed population: 313 cells/uL (ref 200–950)
WBC: 9.2 10*3/uL (ref 3.8–10.8)

## 2017-05-14 LAB — LIPID PANEL
CHOL/HDL RATIO: 2.2 (calc) (ref <5.0)
Cholesterol: 134 mg/dL (ref <200)
HDL: 61 mg/dL (ref >50)
LDL CHOLESTEROL (CALC): 58 mg/dL
NON-HDL CHOLESTEROL (CALC): 73 mg/dL (ref <130)
Triglycerides: 74 mg/dL (ref <150)

## 2017-05-14 LAB — COMPLETE METABOLIC PANEL WITH GFR
AG Ratio: 1.5 (calc) (ref 1.0–2.5)
ALKALINE PHOSPHATASE (APISO): 48 U/L (ref 33–130)
ALT: 11 U/L (ref 6–29)
AST: 15 U/L (ref 10–35)
Albumin: 4.4 g/dL (ref 3.6–5.1)
BUN: 7 mg/dL (ref 7–25)
CALCIUM: 10 mg/dL (ref 8.6–10.4)
CO2: 23 mmol/L (ref 20–32)
CREATININE: 0.74 mg/dL (ref 0.60–0.93)
Chloride: 112 mmol/L — ABNORMAL HIGH (ref 98–110)
GFR, EST NON AFRICAN AMERICAN: 80 mL/min/{1.73_m2} (ref >=60)
GFR, Est African American: 92 mL/min/{1.73_m2} (ref >=60)
GLUCOSE: 82 mg/dL (ref 65–99)
Globulin: 3 g/dL (calc) (ref 1.9–3.7)
Potassium: 3.6 mmol/L (ref 3.5–5.3)
Sodium: 143 mmol/L (ref 135–146)
Total Bilirubin: 0.6 mg/dL (ref 0.2–1.2)
Total Protein: 7.4 g/dL (ref 6.1–8.1)

## 2017-05-14 LAB — HIV-1 RNA QUANT-NO REFLEX-BLD
HIV 1 RNA QUANT: 63 {copies}/mL — AB
HIV-1 RNA QUANT, LOG: 1.8 {Log_copies}/mL — AB

## 2017-05-14 LAB — RPR: RPR: NONREACTIVE

## 2017-05-16 ENCOUNTER — Other Ambulatory Visit: Payer: Self-pay | Admitting: Internal Medicine

## 2017-05-16 DIAGNOSIS — M5136 Other intervertebral disc degeneration, lumbar region: Secondary | ICD-10-CM

## 2017-05-16 MED ORDER — FENTANYL 25 MCG/HR TD PT72: 25.0000 ug | MEDICATED_PATCH | TRANSDERMAL | 0 refills | Status: DC

## 2017-05-16 MED ORDER — HYDROCODONE-ACETAMINOPHEN 10-325 MG PO TABS: 1.0000 | ORAL_TABLET | Freq: Two times a day (BID) | ORAL | 0 refills | Status: DC | PRN

## 2017-05-17 ENCOUNTER — Other Ambulatory Visit: Payer: Self-pay | Admitting: Internal Medicine

## 2017-05-17 DIAGNOSIS — I10 Essential (primary) hypertension: Secondary | ICD-10-CM

## 2017-05-17 NOTE — Telephone Encounter (Signed)
Next appt scheduled 4/5 with PCP. 

## 2017-05-21 ENCOUNTER — Ambulatory Visit (INDEPENDENT_AMBULATORY_CARE_PROVIDER_SITE_OTHER): Payer: Medicare HMO | Admitting: Infectious Disease

## 2017-05-21 ENCOUNTER — Encounter: Payer: Self-pay | Admitting: Infectious Disease

## 2017-05-21 VITALS — BP 157/79 | HR 56 | Temp 97.8°F | Ht 68.0 in | Wt 131.0 lb

## 2017-05-21 DIAGNOSIS — Z79899 Other long term (current) drug therapy: Secondary | ICD-10-CM | POA: Diagnosis not present

## 2017-05-21 DIAGNOSIS — B20 Human immunodeficiency virus [HIV] disease: Secondary | ICD-10-CM | POA: Diagnosis not present

## 2017-05-21 DIAGNOSIS — R69 Illness, unspecified: Secondary | ICD-10-CM | POA: Diagnosis not present

## 2017-05-21 DIAGNOSIS — I10 Essential (primary) hypertension: Secondary | ICD-10-CM

## 2017-05-21 DIAGNOSIS — Z113 Encounter for screening for infections with a predominantly sexual mode of transmission: Secondary | ICD-10-CM | POA: Diagnosis not present

## 2017-05-21 NOTE — Progress Notes (Signed)
Chief complaint: Follow-up for HIV on medication  Subjective:    Patient ID: Jessica [name], female    DOB: 1940/08/07, 77 y.o.   MRN: [id number]  HPI  Jessica [name] is a 77 y.o. female with HIV infection who is doing superbly well on her antiviral regimen, changed from Atripla to [facility] and [place] with undetectable viral load and health cd4 count, though her VL when checked in [facility] was now 290 and upon recheck was <20  Lab Results  Component Value Date   HIV1RNAQUANT 63 (H) 05/11/2017   HIV1RNAQUANT <20 NOT DETECTED 03/19/2017   HIV1RNAQUANT <20 03/01/2016     Lab Results  Component Value Date   CD4TABS 1,710 05/11/2017   CD4TABS 2,240 03/19/2017   CD4TABS 2,060 03/01/2016    Since I last saw her we changed her over to [facility].  She herself states that she did not realize that the medications have been changed though her daughter had realize this.  Her viral load is up slightly at 63 which I suspect is another blip.    Past Medical History:  Diagnosis Date  . Anxiety 04/05/2012  . Bilateral sensorineural hearing loss 06/11/2014   Mild to moderate on the left side and slight to mild on the right side per audiometry 05/2014.  Hearing aides with possible masking of tinnitus recommended but patient wished to defer secondary to finances.  . Blood transfusion without reported diagnosis    pt denies  . Bursitis of right shoulder 07/12/2012   s/p shoulder injection 07/12/2012   . Cataract of right eye   . Constipation due to pain medication 04/27/2010  . Diverticulosis 02/08/2012   Extensive left-sided diverticula on colonoscopy March 2012 per Dr. Gala Romney   . Essential hypertension 07/20/2006  . Genital herpes 07/20/2006  . 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 Heart murmur 1961  . Human immunodeficiency virus disease ([place]) 03/27/1986  . Hyperlipidemia LDL goal < 100 04/05/2012  . Long-term current use of opiate analgesic 03/17/2016  . Lumbar degenerative disc disease 07/20/2006    With chronic back pain   . Marijuana use 07/03/2016  . Micturition syncope 09/20/2015  . 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[place]) 11/01/2011   s/p aortobifem bypass 2009   . Periumbilical hernia 06/13/3293   1 cm left periumbilical abdominal wall defect  . 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 Right rotator cuff tear 02/01/2013   Responds to periodic steroid injections  . Seborrhea 09/01/2010  . Small bowel obstruction due to adhesions ([place]) 02/08/2012   s/p Exploratory laparotomy, lysis of adhesions 02/12/12    . 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 Tobacco abuse 02/19/2012  . Tobacco abuse   . Vasovagal syncope 02/15/2015  . Voiding dysfunction    s/p cystoscopy and meatal dilation Dec 2005    Past Surgical History:  Procedure Laterality Date  . ABDOMINAL HYSTERECTOMY    . AORTO-FEMORAL BYPASS GRAFT  04/2007  . APPENDECTOMY    . BREAST SURGERY     Breast biopsy: negative  . CHOLECYSTECTOMY    . COLECTOMY  01/2011   Dr. Margot Chimes; "took out 12 inches of small intestiines and removed blockage"  . EYE SURGERY    . LAPAROTOMY  02/12/2012   Procedure: EXPLORATORY LAPAROTOMY;  Surgeon: Stark Klein, MD;  Location: [facility] OR;  Service: General;  Laterality: N/A;  Exploratory Laparotomy, lysis of adhesions  . SMALL INTESTINE SURGERY  Family History  Problem Relation Age of Onset  . Kidney failure Mother   . Diabetes Mother   . Hypertension Mother   . Heart disease Mother   . Glaucoma Father   . Congestive Heart Failure Sister   . Diabetes Sister   . Kidney disease Sister   . Diabetes Brother   . Unexplained death Brother 68       Automobile accident  . Hypothyroidism Daughter   . Arthritis Daughter        Neck/Back  . Healthy Son   . HIV/AIDS Brother   . HIV Daughter   . Kidney disease Daughter   . Arthritis Son        Knee      Social History   Socioeconomic History  . Marital status: Widowed     Spouse name: None  . Number of children: 4  . Years of education: 2y college  . Highest education level: None  Social Needs  . Financial resource strain: None  . Food insecurity - worry: None  . Food insecurity - inability: None  . Transportation needs - medical: None  . Transportation needs - non-medical: None  Occupational History  . 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 Smoking status: Current Every Day Smoker    Packs/day: 1.00    Years: 50.00    Pack years: 50.00    Types: Cigarettes  . Smokeless tobacco: Never Used  . Tobacco comment: 1 pkd   Substance and Sexual Activity  . Alcohol use: No    Alcohol/week: 0.0 oz    Comment: "last drink of alcohol ~ 1977"  . Drug use: No    Comment: cutting back on chantix, 1 cigarette this morning  . Sexual activity: No    Partners: Male  Other Topics Concern  . None  Social History Narrative   Lives alone in [place], [place]    Allergies  Allergen Reactions  . Hctz [Hydrochlorothiazide] Other (See Comments)    Dizziness, syncope. Does not wish to take anymore     Current Outpatient Medications:  .  acyclovir (ZOVIRAX) 400 MG tablet, TAKE 1 TABLET BY MOUTH THREE TIMES DAILY AS NEEDED FOR OUTBREAKS FOR 7 DAYS, Disp: 21 tablet, Rfl: 5 .  alendronate (FOSAMAX) 70 MG tablet, TAKE 1 TABLET BY MOUTH ONCE EVERY WEEK, Disp: 12 tablet, Rfl: 3 .  ALPRAZolam (XANAX) 1 MG tablet, Take 1 tablet (1 mg total) by mouth 3 (three) times daily as needed for anxiety., Disp: 80 tablet, Rfl: 3 .  amLODipine (NORVASC) 10 MG tablet, Take 1 tablet (10 mg total) by mouth daily., Disp: 90 tablet, Rfl: 3 .  aspirin 81 MG EC tablet, Take 81 mg by mouth daily.  , Disp: , Rfl:  .  atenolol (TENORMIN) 100 MG tablet, Take 1 tablet (100 mg total) by mouth daily., Disp: 90 tablet, Rfl: 3 .  atorvastatin (LIPITOR) 10 MG tablet, TAKE 1 TABLET BY MOUTH EVERY DAY (STOP LOVASTATIN), Disp: 30 tablet, Rfl: 11 .   bictegravir-emtricitabine-tenofovir AF (BIKTARVY) 50-200-25 MG TABS tablet, Take 1 tablet by mouth daily., Disp: 30 tablet, Rfl: 11 .  calcium-vitamin D (OSCAL WITH D) 500-200 MG-UNIT per tablet, Take 1 tablet by mouth 2 (two) times daily., Disp: , Rfl:  .  COMBIGAN 0.2-0.5 % ophthalmic solution, Place 1 drop into the left eye 2 (two) times daily., Disp: , Rfl:  .  docusate sodium (COLACE) 100  MG capsule, Take 100 mg by mouth 2 (two) times daily., Disp: , Rfl:  .  fentaNYL (DURAGESIC - DOSED MCG/HR) 25 MCG/HR patch, Place 1 patch (25 mcg total) onto the skin every 3 (three) days., Disp: 10 patch, Rfl: 0 .  HYDROcodone-acetaminophen (NORCO) 10-325 MG tablet, Take 1 tablet by mouth every 12 (twelve) hours as needed for severe pain., Disp: 55 tablet, Rfl: 0 .  Multiple Vitamin (MULTIVITAMIN WITH MINERALS) TABS, Take 1 tablet by mouth daily., Disp: , Rfl:  .  pantoprazole (PROTONIX) 40 MG tablet, TAKE ONE TABLET BY MOUTH EVERY DAY, Disp: 30 tablet, Rfl: 5 .  triamcinolone ointment (KENALOG) 0.1 %, APPLY TO AFFECTED AREA TWICE DAILY, Disp: 454 g, Rfl: 3 .  benazepril (LOTENSIN) 40 MG tablet, Take 1 tablet (40 mg total) by mouth daily., Disp: 90 tablet, Rfl: 3 .  ibuprofen (ADVIL,MOTRIN) 200 MG tablet, Take 400 mg by mouth 3 (three) times daily as needed for pain., Disp: , Rfl:  .  Naloxone HCl (EVZIO) 0.4 MG/0.4ML SOAJ, Inject 0.4 mg as directed once as needed. For overdose and dial 911. May give another dose if needed., Disp: 0.4 mL, Rfl: 0 .  sorbitol 70 % solution, Take 15 mLs by mouth daily as needed., Disp: 473 mL, Rfl: 0 .  spironolactone (ALDACTONE) 100 MG tablet, Take 1 tablet (100 mg total) by mouth daily., Disp: 90 tablet, Rfl: 3        Review of Systems  Constitutional: Negative for activity change, appetite change, chills, diaphoresis, fatigue and unexpected weight change.  HENT: Negative for congestion, ear pain, rhinorrhea, sinus pressure, sneezing, sore throat and trouble swallowing.    Eyes: Negative for photophobia and visual disturbance.  Respiratory: Negative for cough, chest tightness, shortness of breath, wheezing and stridor.   Cardiovascular: Negative for palpitations and leg swelling.  Gastrointestinal: Negative for abdominal distention, anal bleeding, blood in stool, constipation, diarrhea, nausea and vomiting.  Genitourinary: Negative for difficulty urinating, flank pain and hematuria.  Musculoskeletal: Negative for arthralgias, gait problem, joint swelling and myalgias.  Skin: Negative for color change, pallor and wound.  Neurological: Negative for dizziness, tremors and light-headedness.  Hematological: Negative for adenopathy. Does not bruise/bleed easily.  Psychiatric/Behavioral: Negative for agitation, behavioral problems, confusion, decreased concentration, dysphoric mood and sleep disturbance.        Objective:   Physical Exam  Constitutional: She is oriented to person, place, and time. She appears well-developed and well-nourished. No distress.  HENT:  Head: Normocephalic and atraumatic.  Mouth/Throat: No oropharyngeal exudate.  Eyes: Conjunctivae and EOM are normal. No scleral icterus.  Neck: Normal range of motion. Neck supple.  Cardiovascular: Normal rate and regular rhythm.  Pulmonary/Chest: Effort normal. No respiratory distress. She has no wheezes.  Abdominal: She exhibits no distension.  Musculoskeletal: She exhibits no edema or tenderness.  Neurological: She is alert and oriented to person, place, and time. She exhibits normal muscle tone. Coordination normal.  Skin: Skin is warm and dry. No rash noted. She is not diaphoretic. No erythema. No pallor.  Psychiatric: Her behavior is normal. Judgment and thought content normal.  Nursing note and vitals reviewed.         Assessment & Plan:     HIV: Continue BIKTARVY she has renewed her SPAP program bring back to clinic in July when is time to renew the program  Chec  AVOID TAKING  CALCIUm, mag, MVI at same time as HIV meds UNLESS she takes a full meal   HTN being managed  by Dr. Eppie Gibson.   Hyperlipidemia: Continue Lipitor   Memory problems: appear mild so far but will monitor. Because she is already on an INSTI she cannot be candidaet for our Neurocognitive study with ACTG

## 2017-05-30 ENCOUNTER — Other Ambulatory Visit: Payer: Self-pay | Admitting: *Deleted

## 2017-05-30 MED ORDER — ALENDRONATE SODIUM 70 MG PO TABS: 70.0000 mg | ORAL_TABLET | ORAL | 0 refills | Status: DC

## 2017-05-30 NOTE — Telephone Encounter (Signed)
A 4 week supply of alendronate was prescribed with no refills.  We will discuss a drug holiday at the follow-up visit next month.

## 2017-06-05 DIAGNOSIS — H401133 Primary open-angle glaucoma, bilateral, severe stage: Secondary | ICD-10-CM | POA: Diagnosis not present

## 2017-06-14 ENCOUNTER — Other Ambulatory Visit: Payer: Self-pay | Admitting: Internal Medicine

## 2017-06-15 ENCOUNTER — Ambulatory Visit (INDEPENDENT_AMBULATORY_CARE_PROVIDER_SITE_OTHER): Payer: Medicare HMO | Admitting: Internal Medicine

## 2017-06-15 ENCOUNTER — Encounter: Payer: Self-pay | Admitting: Internal Medicine

## 2017-06-15 ENCOUNTER — Other Ambulatory Visit: Payer: Self-pay

## 2017-06-15 VITALS — BP 175/76 | HR 66 | Temp 98.6°F | Ht 67.0 in | Wt 132.1 lb

## 2017-06-15 DIAGNOSIS — I739 Peripheral vascular disease, unspecified: Secondary | ICD-10-CM | POA: Diagnosis not present

## 2017-06-15 DIAGNOSIS — H409 Unspecified glaucoma: Secondary | ICD-10-CM | POA: Diagnosis not present

## 2017-06-15 DIAGNOSIS — G8929 Other chronic pain: Secondary | ICD-10-CM

## 2017-06-15 DIAGNOSIS — I10 Essential (primary) hypertension: Secondary | ICD-10-CM

## 2017-06-15 DIAGNOSIS — Z7982 Long term (current) use of aspirin: Secondary | ICD-10-CM

## 2017-06-15 DIAGNOSIS — H539 Unspecified visual disturbance: Secondary | ICD-10-CM | POA: Diagnosis not present

## 2017-06-15 DIAGNOSIS — E785 Hyperlipidemia, unspecified: Secondary | ICD-10-CM | POA: Diagnosis not present

## 2017-06-15 DIAGNOSIS — F17200 Nicotine dependence, unspecified, uncomplicated: Secondary | ICD-10-CM

## 2017-06-15 DIAGNOSIS — M5136 Other intervertebral disc degeneration, lumbar region: Secondary | ICD-10-CM | POA: Diagnosis not present

## 2017-06-15 DIAGNOSIS — B20 Human immunodeficiency virus [HIV] disease: Secondary | ICD-10-CM

## 2017-06-15 DIAGNOSIS — K5900 Constipation, unspecified: Secondary | ICD-10-CM | POA: Diagnosis not present

## 2017-06-15 DIAGNOSIS — Z720 Tobacco use: Secondary | ICD-10-CM

## 2017-06-15 DIAGNOSIS — Z79891 Long term (current) use of opiate analgesic: Secondary | ICD-10-CM | POA: Diagnosis not present

## 2017-06-15 DIAGNOSIS — M75101 Unspecified rotator cuff tear or rupture of right shoulder, not specified as traumatic: Secondary | ICD-10-CM

## 2017-06-15 DIAGNOSIS — Z79899 Other long term (current) drug therapy: Secondary | ICD-10-CM

## 2017-06-15 DIAGNOSIS — N39498 Other specified urinary incontinence: Secondary | ICD-10-CM

## 2017-06-15 DIAGNOSIS — E7800 Pure hypercholesterolemia, unspecified: Secondary | ICD-10-CM

## 2017-06-15 DIAGNOSIS — F411 Generalized anxiety disorder: Secondary | ICD-10-CM

## 2017-06-15 MED ORDER — HYDROCODONE-ACETAMINOPHEN 10-325 MG PO TABS: 1.0000 | ORAL_TABLET | Freq: Three times a day (TID) | ORAL | 0 refills | Status: DC | PRN

## 2017-06-15 MED ORDER — FENTANYL 12 MCG/HR TD PT72: 12.5000 ug | MEDICATED_PATCH | TRANSDERMAL | 0 refills | Status: DC

## 2017-06-15 MED ORDER — TERAZOSIN HCL 2 MG PO CAPS: 2.0000 mg | ORAL_CAPSULE | Freq: Every day | ORAL | 3 refills | Status: DC

## 2017-06-15 NOTE — Assessment & Plan Note (Signed)
Assessment  With the gradual taper of her hydrocodone-acetaminophen 10-325 mg to 55 tablets per month she felt this wasn't an adequate number to manage her pain when it would act up. We discussed my concerns that she was on an excessive amount of opioids and that the taper was necessary. I told her I did not feel the fentanyl patches were providing her with much relief given that she reports they frequently fall off of her skin, particularly overnight.   The [place] controlled substance database was reviewed today and found without any red flags present.  Plan  We therefore decided to increase the number of hydrocodone-acetaminophen 10-325 mg tablets to 90 per month and to cut the fentanyl patches to 12.5 mcg/h every 3 days.  We will reassess the efficacy of this regimen in controlling her pain to the point that she is able to remain ambulatory and to achieve her necessary activities. Ultimately, we discussed my goal to completely stop the fentanyl patches and maintain her on as needed hydrocodone-acetaminophen. A urine drug screen was obtained today and is pending at the time of this dictation.

## 2017-06-15 NOTE — Assessment & Plan Note (Signed)
She is currently up-to-date on her healthcare maintenance. 

## 2017-06-15 NOTE — Assessment & Plan Note (Signed)
Assessment  She is starting to note some pain in the right shoulder likely related to her right rotator cuff tear. She is not quite ready for an injection today.  Plan  We will reassess her pain at the follow-up visit, and if necessary provide her with a steroid injection.

## 2017-06-15 NOTE — Assessment & Plan Note (Signed)
Assessment  She is followed closely in the [facility] by Dr. Wendie Agreste. Although there was a minor blip in her viral load, she has been tolerating the new antiretroviral regimen well.  Plan  She will continue the anti-retroviral regimen as provided and manage by the [facility] for [facility]. We will reassess for any intolerances at the follow-up visit.

## 2017-06-15 NOTE — Patient Instructions (Signed)
It was good to see you again.  I am sorry about all of your recent troubles.  1) Keep taking the medications as you are.  2) I adjusted your pain medications.  I increased your Vicodin to #90 tablets per month and changed the fentanyl to 12.5 mg/hr patches every 3 days.  I think this will give you more flexibility with your pain management.  3) I increased your blood pressure regimen to include a new blood pressure medication added to your other 4.  The medication is called terazosin 2 mg every night.  4) Your new pain prescriptions will be due for pick up on Monday April 8th.  I will see you in 3 months, sooner if necessary.

## 2017-06-15 NOTE — Assessment & Plan Note (Signed)
Assessment  She is tolerating the atorvastatin 10 mg by mouth daily without myalgias.  Plan  We will continue with this moderate intensity statin and reassess for intolerances at the follow-up visit.

## 2017-06-15 NOTE — Assessment & Plan Note (Signed)
Assessment  At the last visit she noted that she was going to have a surgical intervention on her left eye related to her glaucoma. She apparently had this surgery and an additional surgery but has been left without any vision in the left eye. She tells me the ophthalmologist stated there was not much else that could be done at this time. Unfortunately, we do not have any of the records from the ophthalmologist.  Plan  She states she has a follow-up of appointment with the ophthalmologist. We are hopeful we can also get these records for our system.

## 2017-06-15 NOTE — Assessment & Plan Note (Signed)
Assessment  Her blood pressure today was elevated at 175/76.  This is on amlodipine 10 mg by mouth daily, benazepril 40 mg by mouth daily, spironolactone 100 mg by mouth daily, and atenolol 100 mg by mouth daily. She states she is taking these medications as prescribed and is tolerating them well.  Plan  We will continue the amlodipine 10 mg by mouth daily, benazepril 40 mg by mouth daily, spironolactone 100 mg by mouth daily, and atenolol 100 mg by mouth daily. She is willing to add an additional medication and I have prescribed Terazosin 2 mg by mouth each night. With this addition we will reassess the blood pressure control at the follow-up visit.

## 2017-06-15 NOTE — Assessment & Plan Note (Signed)
Assessment  She states her general anxiety is well controlled on as needed alprazolam 1 mg by mouth every 8 hours as needed.  She currently receives 80 tablets per month. At the next prescription renewal we will lower this to 75 tablets per month.  Plan  A urine drug screen was obtained today and is pending at the time of this dictation.  We will continue the alprazolam at 1 mg by mouth every 8 hours as needed. We will reassess the efficacy of this therapy at the follow-up visit.

## 2017-06-15 NOTE — Progress Notes (Signed)
   Subjective:    Patient ID: Jessica [name], female    DOB: 24-Aug-1940, 77 y.o.   MRN: [id number]  HPI  Beatrix Breece is here for follow-up of her lumbar degenerative disc disease and chronic pain, generalized anxiety disorder, HIV disease, glaucoma of the left eye, essential hypertension, peripheral vascular occlusive disease, hyperlipidemia, and tobacco abuse disorder. Please see the A&P for the status of the pt's chronic medical problems.  Review of Systems  Constitutional: Negative for activity change and unexpected weight change.  Eyes: Positive for visual disturbance. Negative for pain.  Cardiovascular: Negative for leg swelling.  Gastrointestinal: Positive for constipation. Negative for diarrhea.  Genitourinary:       Urinary incontinence with coughing, laughing, or straining  Musculoskeletal: Positive for back pain. Negative for gait problem and joint swelling.  Neurological: Negative for dizziness, syncope and light-headedness.  Psychiatric/Behavioral: Positive for dysphoric mood. The patient is nervous/anxious.       Objective:   Physical Exam  Constitutional: She is oriented to person, place, and time. She appears well-developed and well-nourished. No distress.  HENT:  Head: Normocephalic and atraumatic.  Eyes: Right eye exhibits no discharge. Left eye exhibits no discharge. No scleral icterus.  Musculoskeletal: Normal range of motion. She exhibits no edema, tenderness or deformity.  Neurological: She is alert and oriented to person, place, and time. She exhibits normal muscle tone.  Skin: Skin is warm and dry. No rash noted. She is not diaphoretic. No erythema.  Psychiatric: She has a normal mood and affect. Her behavior is normal. Judgment and thought content normal.  Nursing note and vitals reviewed.     Assessment & Plan:   Please see problem based charting.

## 2017-06-15 NOTE — Assessment & Plan Note (Signed)
Assessment  She denies any signs or symptoms consistent with significant peripheral vascular occlusive disease. Specifically, she denies any claudication with ambulation.  Plan  We will continue with the aggressive management of her risk factors including her hypertension, hyperlipidemia, and tobacco abuse. As noted elsewhere, her antihypertensive regimen was escalated today. We are also continuing the aspirin 81 mg by mouth daily.  We will reassess for the development of signs or symptoms worrisome for clinically significant peripheral vascular occlusive disease at the follow-up visit.

## 2017-06-15 NOTE — Assessment & Plan Note (Signed)
Assessment  She remains in the pre-contemplative stage with regards to smoking cessation. We again discussed the importance of smoking cessation given her other chronic co-morbidities.   Plan  She is aware that I will bring this topic up at each visit in hopes of moving her past the pre-contemplative stage.

## 2017-06-21 LAB — TOXASSURE SELECT,+ANTIDEPR,UR

## 2017-06-22 NOTE — Progress Notes (Signed)
Patient ID: Jessica [name], female   DOB: 08/20/1940, 77 y.o.   MRN: [id number]  Other than THC UDS was without red flags.  Continue opioid regimen with planned taper as outlined.

## 2017-06-29 ENCOUNTER — Other Ambulatory Visit: Payer: Self-pay | Admitting: Internal Medicine

## 2017-06-29 DIAGNOSIS — M810 Age-related osteoporosis without current pathological fracture: Secondary | ICD-10-CM

## 2017-07-19 ENCOUNTER — Other Ambulatory Visit: Payer: Self-pay | Admitting: Internal Medicine

## 2017-07-19 DIAGNOSIS — I10 Essential (primary) hypertension: Secondary | ICD-10-CM

## 2017-08-09 ENCOUNTER — Ambulatory Visit (INDEPENDENT_AMBULATORY_CARE_PROVIDER_SITE_OTHER): Payer: Medicare HMO | Admitting: Internal Medicine

## 2017-08-09 ENCOUNTER — Encounter: Payer: Self-pay | Admitting: Internal Medicine

## 2017-08-09 VITALS — BP 151/72 | HR 47 | Temp 98.1°F | Wt 131.3 lb

## 2017-08-09 DIAGNOSIS — M810 Age-related osteoporosis without current pathological fracture: Secondary | ICD-10-CM

## 2017-08-09 DIAGNOSIS — M75101 Unspecified rotator cuff tear or rupture of right shoulder, not specified as traumatic: Secondary | ICD-10-CM

## 2017-08-09 DIAGNOSIS — R300 Dysuria: Secondary | ICD-10-CM | POA: Diagnosis not present

## 2017-08-09 DIAGNOSIS — E7800 Pure hypercholesterolemia, unspecified: Secondary | ICD-10-CM | POA: Diagnosis not present

## 2017-08-09 DIAGNOSIS — Z0000 Encounter for general adult medical examination without abnormal findings: Secondary | ICD-10-CM | POA: Diagnosis not present

## 2017-08-09 DIAGNOSIS — K5900 Constipation, unspecified: Secondary | ICD-10-CM | POA: Diagnosis not present

## 2017-08-09 DIAGNOSIS — M5136 Other intervertebral disc degeneration, lumbar region: Secondary | ICD-10-CM | POA: Diagnosis not present

## 2017-08-09 DIAGNOSIS — R69 Illness, unspecified: Secondary | ICD-10-CM | POA: Diagnosis not present

## 2017-08-09 DIAGNOSIS — I739 Peripheral vascular disease, unspecified: Secondary | ICD-10-CM

## 2017-08-09 DIAGNOSIS — F411 Generalized anxiety disorder: Secondary | ICD-10-CM

## 2017-08-09 DIAGNOSIS — Z720 Tobacco use: Secondary | ICD-10-CM | POA: Diagnosis not present

## 2017-08-09 DIAGNOSIS — B20 Human immunodeficiency virus [HIV] disease: Secondary | ICD-10-CM

## 2017-08-09 DIAGNOSIS — I10 Essential (primary) hypertension: Secondary | ICD-10-CM

## 2017-08-09 DIAGNOSIS — M549 Dorsalgia, unspecified: Secondary | ICD-10-CM

## 2017-08-09 MED ORDER — FENTANYL 12 MCG/HR TD PT72: 12.5000 ug | MEDICATED_PATCH | TRANSDERMAL | 0 refills | Status: DC

## 2017-08-09 MED ORDER — DICLOFENAC SODIUM 1 % TD GEL: 2.0000 g | Freq: Four times a day (QID) | TRANSDERMAL | 3 refills | Status: DC

## 2017-08-09 MED ORDER — HYDROCODONE-ACETAMINOPHEN 10-325 MG PO TABS: 1.0000 | ORAL_TABLET | Freq: Three times a day (TID) | ORAL | 0 refills | Status: DC | PRN

## 2017-08-09 MED ORDER — TERAZOSIN HCL 5 MG PO CAPS: 5.0000 mg | ORAL_CAPSULE | Freq: Every day | ORAL | 3 refills | Status: DC

## 2017-08-09 NOTE — Assessment & Plan Note (Signed)
Assessment  She is tolerating atorvastatin 10 mg by mouth daily without myalgias.  Plan  We will continue with this moderate intensity statin and reassess for intolerances at the follow-up visit.

## 2017-08-09 NOTE — Assessment & Plan Note (Signed)
Assessment  At the last clinic visit she notified me that she was taking Azo or phenazopyridine for urinary symptoms. She stated this was helpful and I had told her that I would look it up to make sure there were no interactions with other medication she was taking. I forgot to do so. She stopped taking the Azo waiting to hear back from me and noted some return in her symptoms. She is interested in restarting the Azo if there are no interactions. Upon review of the interactions the only medication that may be impacted by the use of Azo his prilocaine which she is not on.  Plan  She was advised to restart the Azo for her dysuria if she felt it was helpful as there was no interactions with her other medications. We will assess the efficacy of this over-the-counter therapy for her dysuria at the return visit.

## 2017-08-09 NOTE — Assessment & Plan Note (Signed)
Assessment  She continues to follow closely in the [facility] for [facility] and is tolerating her highly active retroviral therapy well.  Plan  She will continue with her highly active retroviral therapy with close follow-up in the [facility] for [facility].

## 2017-08-09 NOTE — Assessment & Plan Note (Signed)
Assessment  We discussed the importance of smoking cessation and she is not mentally ready to quit at this time.  Plan  We will reassess her level of readiness to quit smoking at the follow-up visit given the importance of tobacco cessation with underlying peripheral vascular occlusive disease, as well as her other chronic conditions.

## 2017-08-09 NOTE — Assessment & Plan Note (Signed)
A mammogram and DEXA scan were ordered for after 08/25/2017. She is otherwise up-to-date on her health care maintenance.

## 2017-08-09 NOTE — Assessment & Plan Note (Signed)
Assessment  She currently denies any symptoms of peripheral vascular occlusive disease including claudication.  Plan  We will continue to treat her hyperlipidemia with statin therapy and her hypertension with an escalation in her pharmacologic therapy. We have also advised her to quit smoking, although she remains in the pre-contemplative stage.

## 2017-08-09 NOTE — Patient Instructions (Signed)
I am sorry I forgot to look up ozo.  I will do so.  1) I increased the terazosin to 5 mg at night for the blood pressure.  2) I start Voltaren gel 1% to right shoulder twice a day.  3) Keep taking the other medications as you are.  4) I will order a bone density and mammogram after June 15th.  I will see you back in 3 months, sooner if necessary.

## 2017-08-09 NOTE — Assessment & Plan Note (Signed)
Assessment  She is tolerating the alendronate and calcium and vitamin D without difficulty. 08/25/2017 will be the two-year mark from her previous bone density scan.  Plan  We will obtain a DEXA scan after 08/25/2017 to reassess the stability of her postmenopausal osteoporosis on her bisphosphonate therapy. In the interim, we will continue with the alendronate, calcium, and vitamin D.

## 2017-08-09 NOTE — Assessment & Plan Note (Signed)
Assessment  At the last clinic visit we increased her hydrocodone-acetaminophen 10-325 mg 1 tablet every 8 hours to #90 per month and decreased her fentanyl to 12.5 mg/hr patch. With this change she feels that her chronic back pain remains well controlled and she subjectively feels less sleepy during the day. We both consider this a victory and we will continue at the current level since it is working well for her with regards to allowing her to remain active and complete her activities of daily living.  Plan  I renewed the hydrocodone-acetaminophen 10-325 mg 1 tablet every 8 hours as needed for severe pain #90 per month and fentanyl 12.5 mg per hour patch #10 per month for her chronic lumbar degenerative disc disease. She has enough refills through 11/15/2017. We will reassess the efficacy of this regimen at the follow-up visit.

## 2017-08-09 NOTE — Assessment & Plan Note (Signed)
Assessment  Her generalized anxiety disorder remains well controlled on as needed alprazolam 1 mg every 8 hours as needed dispense #80 per month.  Plan  We will continue the alprazolam 1 mg by mouth every 8 hours as needed for anxiety. At the next refill we will decrease this to #75 per month.

## 2017-08-09 NOTE — Assessment & Plan Note (Signed)
Assessment  Her blood pressure remains elevated today at 151/72. This is on 5 medications. She is currently on spironolactone 100 mg by mouth daily, benazepril 40 mg by mouth daily, atenolol 100 mg by mouth daily with a resting heart rate of 47, amlodipine 10 mg by mouth daily, and terazosin 2 mg by mouth daily. Given that she has suboptimal control on 5 different antihypertensives, which she claims to be taking regularly, it raises the possibility of a secondary cause of her hypertension. As the most common cause of secondary hypertension as hyperaldosteronism we will eventually like to assess for this. Unfortunately, while on spironolactone this is not possible. I first would like to get better control of her blood pressure before peeling back the spironolactone to assess for hyperaldosteronism. Other possible causes of secondary hypertension could include renal artery stenosis given her known peripheral vascular occlusive disease.  Plan  We will continue the spironolactone at 100 mg by mouth daily, benazepril at 40 mg by mouth daily, atenolol at 100 mg by mouth daily, amlodipine at 10 mg by mouth daily, and increase the terazosin to 5 mg by mouth at night. We will reassess the efficacy of this therapy at the follow-up visit. If her blood pressure is improved we will peel back the spironolactone so that we can assess for hyperaldosteronism. If she does not have hyperaldosteronism, we will then assess for renal artery stenosis as a potential cause for a secondary hypertension.

## 2017-08-09 NOTE — Progress Notes (Signed)
   Subjective:    Patient ID: Jessica [name], female    DOB: September 29, 1940, 77 y.o.   MRN: [id number]  HPI  Jessica [name] is here for follow-up of her dysuria, chronic back pain, HIV disease, essential hypertension, peripheral vascular occlusive disease, generalized anxiety disorder, tobacco abuse, hyperlipidemia, post menopausal osteoporosis, and right rotator cuff tear. Please see the A&P for the status of the pt's chronic medical problems.  Review of Systems  Constitutional: Negative for activity change and unexpected weight change.  Respiratory: Negative for chest tightness and shortness of breath.   Cardiovascular: Negative for chest pain, palpitations and leg swelling.  Gastrointestinal: Positive for constipation. Negative for abdominal distention, abdominal pain, diarrhea, nausea and vomiting.  Genitourinary: Negative for difficulty urinating.  Musculoskeletal: Positive for arthralgias and back pain. Negative for gait problem, joint swelling and myalgias.  Skin: Negative for rash.      Objective:   Physical Exam  Constitutional: She is oriented to person, place, and time. She appears well-developed and well-nourished. No distress.  HENT:  Head: Normocephalic and atraumatic.  Eyes: Conjunctivae are normal. Right eye exhibits no discharge. Left eye exhibits no discharge. No scleral icterus.  Musculoskeletal: She exhibits tenderness. She exhibits no edema.  Right anterior shoulder tenderness to palpation with increased pain at 45 degrees of abduction.  Neurological: She is alert and oriented to person, place, and time. She exhibits normal muscle tone.  Skin: Skin is warm and dry. No rash noted. She is not diaphoretic. No erythema.  Psychiatric: She has a normal mood and affect. Her behavior is normal. Judgment and thought content normal.  Nursing note and vitals reviewed.     Assessment & Plan:   Please see problem oriented charting.

## 2017-08-09 NOTE — Assessment & Plan Note (Signed)
Assessment  She continues to have some pain in the right shoulder consistent with her bursitis or right rotator cuff tear. She borrowed a relatives multi Erygel and placed it over the right shoulder. This worked very well and relieving her pain and she is now asking for her own prescription of Voltaren gel.  Plan  A prescription for Voltaren gel was provided today. We will reassess the efficacy of this therapy at the follow-up visit.

## 2017-08-15 ENCOUNTER — Telehealth: Payer: Self-pay | Admitting: *Deleted

## 2017-08-15 NOTE — Telephone Encounter (Signed)
Call to [employer] for PA for Diclofenac Gel 1%.  Approved 03/11/2017 thru 03/12/2018.  [facility] was called and informed of.  Regino Schultze Ilze Roselli RN 08/15/2017 9:53 AM.

## 2017-08-17 ENCOUNTER — Other Ambulatory Visit: Payer: Self-pay

## 2017-08-17 ENCOUNTER — Encounter (HOSPITAL_COMMUNITY): Payer: Self-pay | Admitting: Emergency Medicine

## 2017-08-17 ENCOUNTER — Emergency Department (HOSPITAL_COMMUNITY): Payer: Medicare HMO

## 2017-08-17 ENCOUNTER — Inpatient Hospital Stay (HOSPITAL_COMMUNITY)
Admission: EM | Admit: 2017-08-17 | Discharge: 2017-08-22 | DRG: 689 | Disposition: A | Discharge status: Home Health | Payer: Medicare HMO | Attending: Internal Medicine | Admitting: Internal Medicine

## 2017-08-17 ENCOUNTER — Inpatient Hospital Stay (HOSPITAL_COMMUNITY): Payer: Medicare HMO

## 2017-08-17 DIAGNOSIS — Z7982 Long term (current) use of aspirin: Secondary | ICD-10-CM | POA: Diagnosis not present

## 2017-08-17 DIAGNOSIS — Z79891 Long term (current) use of opiate analgesic: Secondary | ICD-10-CM

## 2017-08-17 DIAGNOSIS — G92 Toxic encephalopathy: Secondary | ICD-10-CM | POA: Diagnosis not present

## 2017-08-17 DIAGNOSIS — F1320 Sedative, hypnotic or anxiolytic dependence, uncomplicated: Secondary | ICD-10-CM | POA: Diagnosis present

## 2017-08-17 DIAGNOSIS — R001 Bradycardia, unspecified: Secondary | ICD-10-CM | POA: Diagnosis present

## 2017-08-17 DIAGNOSIS — E870 Hyperosmolality and hypernatremia: Secondary | ICD-10-CM | POA: Diagnosis not present

## 2017-08-17 DIAGNOSIS — J439 Emphysema, unspecified: Secondary | ICD-10-CM | POA: Diagnosis not present

## 2017-08-17 DIAGNOSIS — Z79899 Other long term (current) drug therapy: Secondary | ICD-10-CM | POA: Diagnosis not present

## 2017-08-17 DIAGNOSIS — R4020 Unspecified coma: Secondary | ICD-10-CM | POA: Diagnosis not present

## 2017-08-17 DIAGNOSIS — H409 Unspecified glaucoma: Secondary | ICD-10-CM | POA: Diagnosis not present

## 2017-08-17 DIAGNOSIS — H903 Sensorineural hearing loss, bilateral: Secondary | ICD-10-CM | POA: Diagnosis present

## 2017-08-17 DIAGNOSIS — M810 Age-related osteoporosis without current pathological fracture: Secondary | ICD-10-CM | POA: Diagnosis present

## 2017-08-17 DIAGNOSIS — G929 Unspecified toxic encephalopathy: Secondary | ICD-10-CM | POA: Diagnosis present

## 2017-08-17 DIAGNOSIS — G8929 Other chronic pain: Secondary | ICD-10-CM | POA: Diagnosis present

## 2017-08-17 DIAGNOSIS — K5903 Drug induced constipation: Secondary | ICD-10-CM | POA: Diagnosis present

## 2017-08-17 DIAGNOSIS — B20 Human immunodeficiency virus [HIV] disease: Secondary | ICD-10-CM | POA: Diagnosis present

## 2017-08-17 DIAGNOSIS — R69 Illness, unspecified: Secondary | ICD-10-CM | POA: Diagnosis not present

## 2017-08-17 DIAGNOSIS — E876 Hypokalemia: Secondary | ICD-10-CM | POA: Diagnosis present

## 2017-08-17 DIAGNOSIS — F17200 Nicotine dependence, unspecified, uncomplicated: Secondary | ICD-10-CM | POA: Diagnosis present

## 2017-08-17 DIAGNOSIS — N390 Urinary tract infection, site not specified: Secondary | ICD-10-CM | POA: Diagnosis not present

## 2017-08-17 DIAGNOSIS — G9340 Encephalopathy, unspecified: Secondary | ICD-10-CM

## 2017-08-17 DIAGNOSIS — I10 Essential (primary) hypertension: Secondary | ICD-10-CM | POA: Diagnosis not present

## 2017-08-17 DIAGNOSIS — B9620 Unspecified Escherichia coli [E. coli] as the cause of diseases classified elsewhere: Secondary | ICD-10-CM | POA: Diagnosis present

## 2017-08-17 DIAGNOSIS — E860 Dehydration: Secondary | ICD-10-CM | POA: Diagnosis present

## 2017-08-17 DIAGNOSIS — R410 Disorientation, unspecified: Secondary | ICD-10-CM | POA: Diagnosis not present

## 2017-08-17 DIAGNOSIS — F1120 Opioid dependence, uncomplicated: Secondary | ICD-10-CM | POA: Diagnosis present

## 2017-08-17 DIAGNOSIS — F411 Generalized anxiety disorder: Secondary | ICD-10-CM | POA: Diagnosis present

## 2017-08-17 DIAGNOSIS — A6000 Herpesviral infection of urogenital system, unspecified: Secondary | ICD-10-CM | POA: Diagnosis present

## 2017-08-17 DIAGNOSIS — N179 Acute kidney failure, unspecified: Secondary | ICD-10-CM | POA: Diagnosis present

## 2017-08-17 DIAGNOSIS — Z8673 Personal history of transient ischemic attack (TIA), and cerebral infarction without residual deficits: Secondary | ICD-10-CM

## 2017-08-17 DIAGNOSIS — F17210 Nicotine dependence, cigarettes, uncomplicated: Secondary | ICD-10-CM | POA: Diagnosis present

## 2017-08-17 DIAGNOSIS — R4182 Altered mental status, unspecified: Secondary | ICD-10-CM | POA: Diagnosis not present

## 2017-08-17 DIAGNOSIS — R569 Unspecified convulsions: Secondary | ICD-10-CM | POA: Diagnosis not present

## 2017-08-17 DIAGNOSIS — Z720 Tobacco use: Secondary | ICD-10-CM

## 2017-08-17 DIAGNOSIS — R451 Restlessness and agitation: Secondary | ICD-10-CM | POA: Diagnosis present

## 2017-08-17 DIAGNOSIS — I739 Peripheral vascular disease, unspecified: Secondary | ICD-10-CM | POA: Diagnosis present

## 2017-08-17 DIAGNOSIS — E785 Hyperlipidemia, unspecified: Secondary | ICD-10-CM | POA: Diagnosis present

## 2017-08-17 HISTORY — DX: Acute kidney failure, unspecified: N17.9

## 2017-08-17 LAB — AMMONIA: AMMONIA: 34 umol/L (ref 9–35)

## 2017-08-17 LAB — COMPREHENSIVE METABOLIC PANEL
ALBUMIN: 4 g/dL (ref 3.5–5.0)
ALT: 58 U/L — ABNORMAL HIGH (ref 14–54)
AST: 148 U/L — AB (ref 15–41)
Alkaline Phosphatase: 55 U/L (ref 38–126)
Anion gap: 13 (ref 5–15)
BILIRUBIN TOTAL: 1.2 mg/dL (ref 0.3–1.2)
BUN: 25 mg/dL — ABNORMAL HIGH (ref 6–20)
CHLORIDE: 111 mmol/L (ref 101–111)
CO2: 22 mmol/L (ref 22–32)
Calcium: 9.7 mg/dL (ref 8.9–10.3)
Creatinine, Ser: 1.56 mg/dL — ABNORMAL HIGH (ref 0.44–1.00)
GFR calc Af Amer: 37 mL/min — ABNORMAL LOW (ref >60)
GFR calc non Af Amer: 32 mL/min — ABNORMAL LOW (ref >60)
GLUCOSE: 113 mg/dL — AB (ref 65–99)
POTASSIUM: 3.3 mmol/L — AB (ref 3.5–5.1)
SODIUM: 146 mmol/L — AB (ref 135–145)
Total Protein: 8.1 g/dL (ref 6.5–8.1)

## 2017-08-17 LAB — GLUCOSE, CAPILLARY: GLUCOSE-CAPILLARY: 181 mg/dL — AB (ref 65–99)

## 2017-08-17 LAB — URINALYSIS, ROUTINE W REFLEX MICROSCOPIC
Bilirubin Urine: NEGATIVE
Glucose, UA: NEGATIVE mg/dL
Ketones, ur: 5 mg/dL — AB
Nitrite: POSITIVE — AB
PROTEIN: NEGATIVE mg/dL
SPECIFIC GRAVITY, URINE: 1.024 (ref 1.005–1.030)
pH: 6 (ref 5.0–8.0)

## 2017-08-17 LAB — RAPID URINE DRUG SCREEN, HOSP PERFORMED
AMPHETAMINES: NOT DETECTED
Barbiturates: NOT DETECTED
Benzodiazepines: POSITIVE — AB
COCAINE: NOT DETECTED
OPIATES: POSITIVE — AB
TETRAHYDROCANNABINOL: POSITIVE — AB

## 2017-08-17 LAB — CBC
HEMATOCRIT: 41.8 % (ref 36.0–46.0)
Hemoglobin: 13.7 g/dL (ref 12.0–15.0)
MCH: 31.9 pg (ref 26.0–34.0)
MCHC: 32.8 g/dL (ref 30.0–36.0)
MCV: 97.2 fL (ref 78.0–100.0)
Platelets: 224 10*3/uL (ref 150–400)
RBC: 4.3 MIL/uL (ref 3.87–5.11)
RDW: 14.2 % (ref 11.5–15.5)
WBC: 10.4 10*3/uL (ref 4.0–10.5)

## 2017-08-17 LAB — CBG MONITORING, ED: Glucose-Capillary: 117 mg/dL — ABNORMAL HIGH (ref 65–99)

## 2017-08-17 LAB — TROPONIN I
TROPONIN I: 0.03 ng/mL — AB (ref <0.03)
TROPONIN I: 0.03 ng/mL — AB (ref <0.03)

## 2017-08-17 LAB — MRSA PCR SCREENING: MRSA BY PCR: NEGATIVE

## 2017-08-17 MED ORDER — ONDANSETRON HCL 4 MG/2ML IJ SOLN
4.0000 mg | Freq: Four times a day (QID) | INTRAMUSCULAR | Status: DC | PRN
Start: 2017-08-17 — End: 2017-08-22

## 2017-08-17 MED ORDER — DOCUSATE SODIUM 100 MG PO CAPS
100.0000 mg | ORAL_CAPSULE | Freq: Two times a day (BID) | ORAL | Status: DC
  Administered 2017-08-19 – 2017-08-22 (×7): 100 mg via ORAL
  Filled 2017-08-17 (×8): qty 1

## 2017-08-17 MED ORDER — HALOPERIDOL LACTATE 5 MG/ML IJ SOLN: 1.0000 mg | Freq: Four times a day (QID) | INTRAMUSCULAR | Status: DC | PRN

## 2017-08-17 MED ORDER — LORAZEPAM 2 MG/ML IJ SOLN
INTRAMUSCULAR | Status: AC
  Filled 2017-08-17: qty 1

## 2017-08-17 MED ORDER — LORAZEPAM 2 MG/ML IJ SOLN
1.0000 mg | Freq: Once | INTRAMUSCULAR | Status: AC
  Administered 2017-08-17: 1 mg via INTRAMUSCULAR
  Filled 2017-08-17: qty 1

## 2017-08-17 MED ORDER — DEXTROSE 5 % IV SOLN
INTRAVENOUS | Status: DC
  Administered 2017-08-17 – 2017-08-19 (×2): via INTRAVENOUS

## 2017-08-17 MED ORDER — ONDANSETRON HCL 4 MG PO TABS: 4.0000 mg | ORAL_TABLET | Freq: Four times a day (QID) | ORAL | Status: DC | PRN

## 2017-08-17 MED ORDER — IOHEXOL 300 MG/ML  SOLN
60.0000 mL | Freq: Once | INTRAMUSCULAR | Status: AC | PRN
  Administered 2017-08-17: 60 mL via INTRAVENOUS

## 2017-08-17 MED ORDER — LORAZEPAM 2 MG/ML IJ SOLN
1.0000 mg | INTRAMUSCULAR | Status: DC | PRN
Start: 2017-08-17 — End: 2017-08-22
  Administered 2017-08-17 – 2017-08-18 (×4): 2 mg via INTRAVENOUS
  Filled 2017-08-17 (×5): qty 1

## 2017-08-17 MED ORDER — SODIUM CHLORIDE 0.9 % IV BOLUS
1000.0000 mL | Freq: Once | INTRAVENOUS | Status: AC
  Administered 2017-08-17: 1000 mL via INTRAVENOUS

## 2017-08-17 MED ORDER — ENOXAPARIN SODIUM 30 MG/0.3ML ~~LOC~~ SOLN
30.0000 mg | SUBCUTANEOUS | Status: DC
  Administered 2017-08-17: 30 mg via SUBCUTANEOUS
  Filled 2017-08-17: qty 0.3

## 2017-08-17 MED ORDER — LORAZEPAM 2 MG/ML IJ SOLN
1.0000 mg | Freq: Once | INTRAMUSCULAR | Status: AC
  Administered 2017-08-17: 1 mg via INTRAVENOUS
  Filled 2017-08-17: qty 1

## 2017-08-17 MED ORDER — HALOPERIDOL LACTATE 5 MG/ML IJ SOLN
2.0000 mg | Freq: Four times a day (QID) | INTRAMUSCULAR | Status: DC | PRN
  Administered 2017-08-17: 2 mg via INTRAVENOUS
  Filled 2017-08-17: qty 1

## 2017-08-17 MED ORDER — ACETAMINOPHEN 650 MG RE SUPP: 650.0000 mg | Freq: Four times a day (QID) | RECTAL | Status: DC | PRN

## 2017-08-17 MED ORDER — HALOPERIDOL LACTATE 5 MG/ML IJ SOLN
5.0000 mg | Freq: Four times a day (QID) | INTRAMUSCULAR | Status: DC | PRN
  Administered 2017-08-17 – 2017-08-18 (×2): 5 mg via INTRAVENOUS
  Filled 2017-08-17 (×3): qty 1

## 2017-08-17 MED ORDER — LORAZEPAM 2 MG/ML IJ SOLN
1.0000 mg | Freq: Once | INTRAMUSCULAR | Status: AC
  Administered 2017-08-17: 1 mg via INTRAVENOUS

## 2017-08-17 MED ORDER — LORAZEPAM 2 MG/ML IJ SOLN: 0.5000 mg | Freq: Four times a day (QID) | INTRAMUSCULAR | Status: DC | PRN

## 2017-08-17 MED ORDER — SODIUM CHLORIDE 0.9 % IV SOLN
1.0000 g | INTRAVENOUS | Status: DC
  Administered 2017-08-17 – 2017-08-19 (×3): 1 g via INTRAVENOUS
  Filled 2017-08-17 (×3): qty 1
  Filled 2017-08-17: qty 10

## 2017-08-17 MED ORDER — MORPHINE SULFATE (PF) 2 MG/ML IV SOLN
2.0000 mg | INTRAVENOUS | Status: DC | PRN
  Administered 2017-08-17 – 2017-08-21 (×8): 2 mg via INTRAVENOUS
  Filled 2017-08-17 (×8): qty 1

## 2017-08-17 MED ORDER — MORPHINE SULFATE (PF) 2 MG/ML IV SOLN
2.0000 mg | Freq: Once | INTRAVENOUS | Status: AC
  Administered 2017-08-17: 2 mg via INTRAMUSCULAR
  Filled 2017-08-17: qty 1

## 2017-08-17 MED ORDER — ACETAMINOPHEN 325 MG PO TABS
650.0000 mg | ORAL_TABLET | Freq: Four times a day (QID) | ORAL | Status: DC | PRN
  Administered 2017-08-22: 650 mg via ORAL
  Filled 2017-08-17: qty 2

## 2017-08-17 MED ORDER — LORAZEPAM 2 MG/ML IJ SOLN: 1.0000 mg | Freq: Once | INTRAMUSCULAR | Status: DC

## 2017-08-17 MED ORDER — HALOPERIDOL LACTATE 5 MG/ML IJ SOLN
5.0000 mg | Freq: Once | INTRAMUSCULAR | Status: AC
  Administered 2017-08-17: 5 mg via INTRAVENOUS
  Filled 2017-08-17: qty 1

## 2017-08-17 NOTE — Progress Notes (Signed)
08/17/2017 6:27 PM  Pt remains agitated, we increased the lorazepam and haloperidol doses, safety restraints ordered, if no improvement, would try precedex infusion, UTI currently being treated, ceftriaxone being infused.   Continue to monitor in SDU.   Will sign out to night time provider.   Murvin Natal MD

## 2017-08-17 NOTE — Progress Notes (Signed)
Patient is still in restraints Incentive is in room

## 2017-08-17 NOTE — ED Notes (Signed)
Patient transported to CT 

## 2017-08-17 NOTE — Progress Notes (Signed)
Pt very agitated upon arrival to unit and completely confused not able to speak only mumble incomprehensible words. Pt has periods of sitting straight up in the bed rocking back and forth and then has periods of apnea and being calm for 10-12 seconds at a time then starts moving constantly in the bed sitting straight up, propping up on her elbows. this is the repeated cycle of how she has been since admission. RN requested restraints as mitts and other less restrictive measures were not able to work and keep pt safe. She is restlessly fighting and not able to be calmed down. She received ativan an hour before coming to the unit and she received haldol when on the unit. She has not calmed down at all even with medication. She has 4 pt restraints on and MD is at bedside assessing pt. MD is going to increase dose and frequency of ativan because no medication is helping patient calm down at this time. Pt has a red mark also on rt buttocks cheek and Rt scapula it is in an odd shape so Md assessed it at bedside. Pt is hitting her head on the bed rails and grinding teeth. Rn at bedside  Ericka Pontiff, [place]

## 2017-08-17 NOTE — H&P (Addendum)
History and Physical  Arturo Freundlich WGN:[id number] DOB: 1940-04-10 DOA: 08/17/2017  PCP: Oval Linsey, MD   Chief Complaint: weakness  HPI: Jessica [name] is a 77 y.o. female with an extensive past medical history including chronic pain, opioid dependence, benzodiazepine dependence, chronic constipation, HIV disease, PVD, glaucoma and other comorbidities detailed below presented to the emergency department by family members with complaints of altered mental status for the last 2 days increasing in lethargic and confusion.  The patient has had a poor oral intake for the past couple of days and had been refusing to take medications or any food in the last 24 hours.  The patient has also been more confused and agitated over the past several days.  The family reports that there have been no changes to her medications.  They deny that the patient had taken any recreational substances.  The patient apparently lives home alone but family members are close and check on her regularly.  They noted that she was her normal self 3 days ago.  The patient has basically been more agitated and anxious, confused and refusing oral intake.  The patient had been moving all extremities.  The family says that she has not had fever, no diarrhea, no rash or headache.  The patient had not been complaining of any illness.  The patient recently had a small change to her blood pressure medications otherwise there have been no other changes.  The patient does have a fentanyl patch for chronic pain and also takes oral opiates in addition to taking regular Xanax at least 3 times daily.  She has not been able to take her benzodiazepine in the last 24 hours because she had been refusing to take her medications.  ED course: The patient was seen in the ED and noted to be mildly agitated and noted to be moving spontaneously in the bed and not being cooperative.  The patient was noted to be bradycardic with heart rate  in the 40s.  Patient was treated with multiple doses of lorazepam and some morphine was also given.  The patient started to calm down after that but then after that began to have more agitation.  The patient has remained nonverbal but does respond to family members.  The patient was scheduled to have an MRI.  The CT did not show acute abnormalities.  The patient was afebrile with a normal white blood cell count.  The patient had a supple neck.  The patient is being admitted to the stepdown unit for further evaluation and management.  Review of Systems: Unable to obtain secondary to patient's current condition  Past Medical History:  Diagnosis Date  . Acute renal failure (ARF) ([place]) 08/17/2017  . Anxiety 04/05/2012  . Bilateral sensorineural hearing loss 06/11/2014   Mild to moderate on the left side and slight to mild on the right side per audiometry 05/2014.  Hearing aides with possible masking of tinnitus recommended but patient wished to defer secondary to finances.  . Blood transfusion without reported diagnosis    pt denies  . Bursitis of right shoulder 07/12/2012   s/p shoulder injection 07/12/2012   . Cataract of right eye   . Constipation due to pain medication 04/27/2010  . Diverticulosis 02/08/2012   Extensive left-sided diverticula on colonoscopy March 2012 per Dr. Gala Romney   . Essential hypertension 07/20/2006  . Genital herpes 07/20/2006  . 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 Heart murmur 1961  . Human  immunodeficiency virus disease ([place]) 03/27/1986  . Hyperlipidemia LDL goal < 100 04/05/2012  . Long-term current use of opiate analgesic 03/17/2016  . Lumbar degenerative disc disease 07/20/2006   With chronic back pain   . Marijuana use 07/03/2016  . Micturition syncope 09/20/2015  . 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[place]) 11/01/2011   s/p aortobifem bypass 2009   . Periumbilical hernia 08/14/4032   1 cm left periumbilical abdominal wall defect  . 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 Right rotator cuff tear 02/01/2013   Responds to periodic steroid injections  . Seborrhea 09/01/2010  . Small bowel obstruction due to adhesions ([place]) 02/08/2012   s/p Exploratory laparotomy, lysis of adhesions 02/12/12    . 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 Tobacco abuse 02/19/2012  . Tobacco abuse   . Vasovagal syncope 02/15/2015  . Voiding dysfunction    s/p cystoscopy and meatal dilation Dec 2005   Past Surgical History:  Procedure Laterality Date  . ABDOMINAL HYSTERECTOMY    . AORTO-FEMORAL BYPASS GRAFT  04/2007  . APPENDECTOMY    . BREAST SURGERY     Breast biopsy: negative  . CHOLECYSTECTOMY    . COLECTOMY  01/2011   Dr. Margot Chimes; "took out 12 inches of small intestiines and removed blockage"  . EYE SURGERY    . LAPAROTOMY  02/12/2012   Procedure: EXPLORATORY LAPAROTOMY;  Surgeon: Stark Klein, MD;  Location: [facility] OR;  Service: General;  Laterality: N/A;  Exploratory Laparotomy, lysis of adhesions  . SMALL INTESTINE SURGERY     Social History:  reports that she has been smoking cigarettes.  She has a 25.00 pack-year smoking history. She has never used smokeless tobacco. She reports that she does not drink alcohol or use drugs.  Allergies  Allergen Reactions  . Hctz [Hydrochlorothiazide] Other (See Comments)    Dizziness, syncope. Does not wish to take anymore    Family History  Problem Relation Age of Onset  . Kidney failure Mother   . Diabetes Mother   . Hypertension Mother   . Heart disease Mother   . Glaucoma Father   . Congestive Heart Failure Sister   . Diabetes Sister   . Kidney disease Sister   . Diabetes Brother   . Unexplained death Brother 80       Automobile accident  . Hypothyroidism Daughter   . Arthritis Daughter        Neck/Back  . Healthy Son   . HIV/AIDS Brother   . HIV Daughter   . Kidney disease Daughter   . Arthritis Son        Knee    Prior to Admission medications     Medication Sig Start Date End Date Taking? Authorizing Provider  acyclovir (ZOVIRAX) 400 MG tablet TAKE 1 TABLET BY MOUTH THREE TIMES DAILY AS NEEDED FOR OUTBREAKS FOR 7 DAYS 03/29/17   Tommy Medal, Lavell Islam, MD  alendronate (FOSAMAX) 70 MG tablet Take 1 tablet (70 mg total) by mouth once a week. 07/02/17   Oval Linsey, MD  ALPRAZolam Duanne Moron) 1 MG tablet Take 1 tablet (1 mg total) by mouth 3 (three) times daily as needed for anxiety. 04/20/17   Oval Linsey, MD  amLODipine (NORVASC) 10 MG tablet Take 1 tablet (10 mg total) by mouth daily. 05/18/17   Oval Linsey, MD  aspirin 81 MG EC tablet Take 81 mg by mouth daily.      [provider]  atenolol (TENORMIN) 100 MG tablet Take 1 tablet (100 mg total) by mouth daily. 03/16/17   Oval Linsey, MD  atorvastatin (LIPITOR) 10 MG tablet TAKE 1 TABLET BY MOUTH EVERY DAY (STOP LOVASTATIN) 03/29/17   Tommy Medal, Lavell Islam, MD  baclofen (LIORESAL) 10 MG tablet Take 10 mg by mouth 3 (three) times daily as needed. for muscle spams 08/03/17   [provider]  benazepril (LOTENSIN) 40 MG tablet TAKE ONE TABLET BY MOUTH DAILY 07/19/17   Annia Belt, MD  bictegravir-emtricitabine-tenofovir AF (BIKTARVY) 50-200-25 MG TABS tablet Take 1 tablet by mouth daily. 03/19/17   Truman Hayward, MD  calcium-vitamin D (OSCAL WITH D) 500-200 MG-UNIT per tablet Take 1 tablet by mouth 2 (two) times daily.    [provider]  COMBIGAN 0.2-0.5 % ophthalmic solution Place 1 drop into the left eye 2 (two) times daily. 01/06/13   [provider]  diclofenac sodium (VOLTAREN) 1 % GEL Apply 2 g topically 4 (four) times daily. 08/09/17   Oval Linsey, MD  docusate sodium (COLACE) 100 MG capsule Take 100 mg by mouth 2 (two) times daily.    [provider]  dorzolamide (TRUSOPT) 2 % ophthalmic solution Place 1 drop into both eyes 2 (two) times daily. 08/03/17   [provider]  fentaNYL (DURAGESIC - DOSED MCG/HR) 12 MCG/HR  Place 1 patch (12.5 mcg total) onto the skin every 3 (three) days. 10/16/17   Oval Linsey, MD  HYDROcodone-acetaminophen (NORCO) 10-325 MG tablet Take 1 tablet by mouth every 8 (eight) hours as needed for severe pain. 10/16/17   Oval Linsey, MD  ibuprofen (ADVIL,MOTRIN) 200 MG tablet Take 400 mg by mouth 3 (three) times daily as needed for pain.    [provider]  latanoprost (XALATAN) 0.005 % ophthalmic solution Place 1 drop into both eyes at bedtime. 08/16/17   [provider]  Multiple Vitamin (MULTIVITAMIN WITH MINERALS) TABS Take 1 tablet by mouth daily.    [provider]  Naloxone HCl (EVZIO) 0.4 MG/0.4ML SOAJ Inject 0.4 mg as directed once as needed. For overdose and dial 911. May give another dose if needed. 04/01/15   Axel Filler, MD  pantoprazole (PROTONIX) 40 MG tablet TAKE ONE TABLET BY MOUTH EVERY DAY 03/29/17   Tommy Medal, Lavell Islam, MD  sorbitol 70 % solution Take 15 mLs by mouth daily as needed. 02/20/14   Oval Linsey, MD  spironolactone (ALDACTONE) 100 MG tablet Take 1 tablet (100 mg total) by mouth daily. 08/25/16   Oval Linsey, MD  terazosin (HYTRIN) 5 MG capsule Take 1 capsule (5 mg total) by mouth at bedtime. 08/09/17   Oval Linsey, MD  triamcinolone ointment (KENALOG) 0.1 % APPLY TO AFFECTED AREA TWICE DAILY 09/20/16   Truman Hayward, MD   Physical Exam: Vitals:   08/17/17 1308 08/17/17 1330 08/17/17 1400 08/17/17 1415  BP:  (!) 164/47 (!) 112/43   Pulse: (!) 47 (!) 56 (!) 46 (!) 57  Resp:    (!) 22  Temp:      TempSrc:      SpO2: 92% 91% 99% 100%  Weight:      Height:         General exam: Patient appears chronically ill, moderately built and nourished patient, lying comfortably supine on the gurney in no obvious distress.  Patient is arousable but lethargic and sleeping.  Head, eyes and ENT: Nontraumatic and normocephalic. Pupils equally reacting to light and accommodation.  Oral mucosa dry.  Neck: Supple. No  JVD, carotid bruit or thyromegaly.  Lymphatics: No lymphadenopathy.  Respiratory system: Clear to auscultation. No increased work of breathing.  Cardiovascular system: S1 and S2 heard, bradycardic rate. No JVD, murmurs, gallops, clicks or pedal edema.  Gastrointestinal system: Abdomen is nondistended, soft and nontender. Normal bowel sounds heard. No organomegaly or masses appreciated.  Central nervous system: Lethargic but arousable. No focal neurological deficits.  Extremities: Symmetric 5 x 5 power. Peripheral pulses symmetrically felt.   Skin: No rashes or acute findings.  Musculoskeletal system: Negative exam.  Psychiatry: Unable to assess.  Labs on Admission:  Basic Metabolic Panel: Recent Labs  Lab 08/17/17 1119  NA 146*  K 3.3*  CL 111  CO2 22  GLUCOSE 113*  BUN 25*  CREATININE 1.56*  CALCIUM 9.7   Liver Function Tests: Recent Labs  Lab 08/17/17 1119  AST 148*  ALT 58*  ALKPHOS 55  BILITOT 1.2  PROT 8.1  ALBUMIN 4.0   No results for input(s): LIPASE, AMYLASE in the last 168 hours. Recent Labs  Lab 08/17/17 1329  AMMONIA 34   CBC: Recent Labs  Lab 08/17/17 1119  WBC 10.4  HGB 13.7  HCT 41.8  MCV 97.2  PLT 224   Cardiac Enzymes: No results for input(s): CKTOTAL, CKMB, CKMBINDEX, TROPONINI in the last 168 hours.  BNP (last 3 results) No results for input(s): PROBNP in the last 8760 hours. CBG: Recent Labs  Lab 08/17/17 1055  GLUCAP 117*    Radiological Exams on Admission: Ct Head Wo Contrast  Result Date: 08/17/2017 CLINICAL DATA:  Generalize weakness over the last 3 days. Confusion. EXAM: CT HEAD WITHOUT CONTRAST TECHNIQUE: Contiguous axial images were obtained from the base of the skull through the vertex without intravenous contrast. COMPARISON:  07/27/2013 FINDINGS: Brain: Generalized atrophy. No evidence of old or acute focal infarction, mass lesion, hemorrhage, hydrocephalus or extra-axial collection. Vascular: There is  atherosclerotic calcification of the major vessels at the base of the brain. Skull: Negative Sinuses/Orbits: Clear/normal Other: None IMPRESSION: No acute or reversible finding.  Age related atrophy. Electronically Signed   By: Nelson Chimes M.D.   On: 08/17/2017 13:10   Ct Chest W Contrast  Result Date: 08/17/2017 CLINICAL DATA:  Generalized weakness for the past 3 days.  Smoker. EXAM: CT CHEST WITH CONTRAST TECHNIQUE: Multidetector CT imaging of the chest was performed during intravenous contrast administration. CONTRAST:  75mL OMNIPAQUE IOHEXOL 300 MG/ML  SOLN COMPARISON:  Chest radiographs dated 01/09/2015 and chest CTA dated 02/03/2012. FINDINGS: Cardiovascular: Atheromatous calcifications, including the coronary arteries and aorta. Mildly enlarged heart. Mediastinum/Nodes: No enlarged lymph nodes. 9 mm left lobe thyroid nodule. Tiny right lobe nodules. Lungs/Pleura: Mild bilateral dependent atelectasis. Mild bilateral bullous changes and mild peribronchial thickening. No lung nodules or pleural fluid. Upper Abdomen: Cholecystectomy clips. Musculoskeletal: Mild thoracic and lower cervical spine degenerative changes. IMPRESSION: 1. No acute abnormality. 2.  Calcific coronary artery and aortic atherosclerosis. 3. Mild changes of COPD and chronic bronchitis. 4. Mild cardiomegaly. Aortic Atherosclerosis (ICD10-I70.0) and Emphysema (ICD10-J43.9). Electronically Signed   By: Claudie Revering M.D.   On: 08/17/2017 13:30    EKG: Independently reviewed.  Sinus bradycardia  Assessment/Plan Principal Problem:   Toxic encephalopathy Active Problems:   Human immunodeficiency virus disease (HCC)   Genital herpes   Glaucoma of left eye   Essential hypertension   Constipation due to pain medication   Tobacco abuse   Generalized anxiety disorder   Bilateral sensorineural hearing  loss   Long-term current use of opiate analgesic   Acute renal failure (ARF) ([facility])   Hypernatremia   Dehydration   Benzodiazepine  dependence ([facility])   Sinus bradycardia  1. Acute toxic encephalopathy-unknown cause-suspect some medication effect secondary to opioid and benzodiazepine usage, however will await for the urine toxicology screen.  The patient is being admitted to the stepdown unit so that she could be monitored closely.  I am concerned about her being able to protect the airway.  Supportive care with IV fluids.  Aspiration precautions.  Neurochecks ordered.  Adult EEG ordered.  MRI brain without contrast ordered.  Neurology consultation requested.  The patient is afebrile with a supple neck and normal white blood cell count therefore low suspicion for meningitis at this time.  Continue to monitor closely.  Obtain blood cultures x2.  Ammonia level normal.  Urinalysis pending.  ** update urinalysis positive for infection, will start antibiotics.  2. UTI - IV ceftriaxone ordered, check urine culture.  3. Sinus bradycardia-family is not aware of any past history of this level of bradycardia.  We will continue to monitor on telemetry closely.  Hopefully will improve with supportive therapy, IV fluids.  The patient is being admitted into the stepdown unit. 4. Chronic pain-patient is dependent on opioids, would continue fentanyl patch that she has been taking for many years per daughter. 5. Benzodiazepine dependence- IV medications ordered as needed.  Follow and monitor closely, hold for sedation. 6. Acute renal failure- treating with IV fluid hydration and will follow BMP. 7. Hypernatremia-secondary to dehydration, treat with hypotonic fluids.  Recheck in a.m. 8. Chronic constipation secondary to chronic opioid use- resume home laxatives.  DVT Prophylaxis: Lovenox Code Status: Full Family Communication: Daughter and nephew Disposition Plan: Inpatient care in the stepdown unit for intensive medical therapy  Critical Care Time spent: 64 minutes  Irwin Brakeman, MD [facility] Pager [phone]  If 7PM-7AM, please  contact night-coverage [email or web address] Password [email or web address] 08/17/2017, 3:31 PM

## 2017-08-17 NOTE — ED Provider Notes (Signed)
[facility] EMERGENCY DEPARTMENT Provider Note   CSN: [id number] Arrival date & time: 08/17/17  1006     History   Chief Complaint Chief Complaint  Patient presents with  . Altered Mental Status    HPI Jessica [name] is a 77 y.o. female.  The history is provided by a relative. No language interpreter was used.  Altered Mental Status   This is a new problem. The current episode started yesterday. The problem has been gradually worsening. Associated symptoms include confusion and agitation. Her past medical history is significant for dementia and psychotropic medication treatment. Her past medical history does not include seizures.  Pt has mild dementia. Daughter reports pt began having increased confusion yesterday.  Pt is not drinking any fluids, not eating,  Pt agitated and confused.   Past Medical History:  Diagnosis Date  . Anxiety 04/05/2012  . Bilateral sensorineural hearing loss 06/11/2014   Mild to moderate on the left side and slight to mild on the right side per audiometry 05/2014.  Hearing aides with possible masking of tinnitus recommended but patient wished to defer secondary to finances.  . Blood transfusion without reported diagnosis    pt denies  . Bursitis of right shoulder 07/12/2012   s/p shoulder injection 07/12/2012   . Cataract of right eye   . Constipation due to pain medication 04/27/2010  . Diverticulosis 02/08/2012   Extensive left-sided diverticula on colonoscopy March 2012 per Dr. Gala Romney   . Essential hypertension 07/20/2006  . Genital herpes 07/20/2006  . 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 Heart murmur 1961  . Human immunodeficiency virus disease ([place]) 03/27/1986  . Hyperlipidemia LDL goal < 100 04/05/2012  . Long-term current use of opiate analgesic 03/17/2016  . Lumbar degenerative disc disease 07/20/2006   With chronic back pain   . Marijuana use 07/03/2016  . Micturition syncope 09/20/2015  . 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[place]) 11/01/2011   s/p  aortobifem bypass 2009   . Periumbilical hernia 08/13/9526   1 cm left periumbilical abdominal wall defect  . 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 Right rotator cuff tear 02/01/2013   Responds to periodic steroid injections  . Seborrhea 09/01/2010  . Small bowel obstruction due to adhesions ([place]) 02/08/2012   s/p Exploratory laparotomy, lysis of adhesions 02/12/12    . 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 Tobacco abuse 02/19/2012  . Tobacco abuse   . Vasovagal syncope 02/15/2015  . Voiding dysfunction    s/p cystoscopy and meatal dilation Dec 2005    Patient Active Problem List   Diagnosis Date Noted  . Dysuria 08/09/2017  . Marijuana use 07/03/2016  . Long-term current use of opiate analgesic 03/17/2016  . Bilateral sensorineural hearing loss 06/11/2014  . 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 Chronic sinusitis 04/02/2013  . Right rotator cuff tear 02/01/2013  . 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 Generalized anxiety disorder 04/05/2012  . Healthcare maintenance 04/05/2012  . Hyperlipidemia 04/05/2012  . Tobacco abuse 02/19/2012  . History of small bowel obstruction 02/08/2012  . Diverticulosis of colon without hemorrhage 02/08/2012  . 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[place]) 11/01/2011  . Seborrhea 09/01/2010  . Constipation due to pain medication 04/27/2010  . Genital herpes 07/20/2006  . 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 Essential hypertension 07/20/2006  . Lumbar degenerative disc disease 07/20/2006  . ABFND, PAP SMEAR, CERVIX/CERVICAL HPV NEC 07/20/2006  .  Human immunodeficiency virus disease ([place]) 03/27/1986    Past Surgical History:  Procedure Laterality Date  . ABDOMINAL HYSTERECTOMY    . AORTO-FEMORAL BYPASS GRAFT  04/2007  . APPENDECTOMY    . BREAST SURGERY     Breast biopsy: negative  . CHOLECYSTECTOMY    . COLECTOMY  01/2011   Dr. Margot Chimes; "took out 12 inches of small  intestiines and removed blockage"  . EYE SURGERY    . LAPAROTOMY  02/12/2012   Procedure: EXPLORATORY LAPAROTOMY;  Surgeon: Stark Klein, MD;  Location: [facility] OR;  Service: General;  Laterality: N/A;  Exploratory Laparotomy, lysis of adhesions  . SMALL INTESTINE SURGERY       OB History    Gravida  6   Para  4   Term  4   Preterm      AB  2   Living  4     SAB  2   TAB      Ectopic      Multiple      Live Births               Home Medications    Prior to Admission medications   Medication Sig Start Date End Date Taking? Authorizing Provider  acyclovir (ZOVIRAX) 400 MG tablet TAKE 1 TABLET BY MOUTH THREE TIMES DAILY AS NEEDED FOR OUTBREAKS FOR 7 DAYS 03/29/17   Tommy Medal, Lavell Islam, MD  alendronate (FOSAMAX) 70 MG tablet Take 1 tablet (70 mg total) by mouth once a week. 07/02/17   Oval Linsey, MD  ALPRAZolam Duanne Moron) 1 MG tablet Take 1 tablet (1 mg total) by mouth 3 (three) times daily as needed for anxiety. 04/20/17   Oval Linsey, MD  amLODipine (NORVASC) 10 MG tablet Take 1 tablet (10 mg total) by mouth daily. 05/18/17   Oval Linsey, MD  aspirin 81 MG EC tablet Take 81 mg by mouth daily.      [provider]  atenolol (TENORMIN) 100 MG tablet Take 1 tablet (100 mg total) by mouth daily. 03/16/17   Oval Linsey, MD  atorvastatin (LIPITOR) 10 MG tablet TAKE 1 TABLET BY MOUTH EVERY DAY (STOP LOVASTATIN) 03/29/17   Tommy Medal, Lavell Islam, MD  benazepril (LOTENSIN) 40 MG tablet TAKE ONE TABLET BY MOUTH DAILY 07/19/17   Annia Belt, MD  bictegravir-emtricitabine-tenofovir AF (BIKTARVY) 50-200-25 MG TABS tablet Take 1 tablet by mouth daily. 03/19/17   Truman Hayward, MD  calcium-vitamin D (OSCAL WITH D) 500-200 MG-UNIT per tablet Take 1 tablet by mouth 2 (two) times daily.    [provider]  COMBIGAN 0.2-0.5 % ophthalmic solution Place 1 drop into the left eye 2 (two) times daily. 01/06/13   [provider]  diclofenac sodium  (VOLTAREN) 1 % GEL Apply 2 g topically 4 (four) times daily. 08/09/17   Oval Linsey, MD  docusate sodium (COLACE) 100 MG capsule Take 100 mg by mouth 2 (two) times daily.    [provider]  fentaNYL (DURAGESIC - DOSED MCG/HR) 12 MCG/HR Place 1 patch (12.5 mcg total) onto the skin every 3 (three) days. 10/16/17   Oval Linsey, MD  HYDROcodone-acetaminophen (NORCO) 10-325 MG tablet Take 1 tablet by mouth every 8 (eight) hours as needed for severe pain. 10/16/17   Oval Linsey, MD  ibuprofen (ADVIL,MOTRIN) 200 MG tablet Take 400 mg by mouth 3 (three) times daily as needed for pain.    [provider]  Multiple Vitamin (MULTIVITAMIN WITH MINERALS) TABS Take 1  tablet by mouth daily.    [provider]  Naloxone HCl (EVZIO) 0.4 MG/0.4ML SOAJ Inject 0.4 mg as directed once as needed. For overdose and dial 911. May give another dose if needed. 04/01/15   Axel Filler, MD  pantoprazole (PROTONIX) 40 MG tablet TAKE ONE TABLET BY MOUTH EVERY DAY 03/29/17   Tommy Medal, Lavell Islam, MD  sorbitol 70 % solution Take 15 mLs by mouth daily as needed. 02/20/14   Oval Linsey, MD  spironolactone (ALDACTONE) 100 MG tablet Take 1 tablet (100 mg total) by mouth daily. 08/25/16   Oval Linsey, MD  terazosin (HYTRIN) 5 MG capsule Take 1 capsule (5 mg total) by mouth at bedtime. 08/09/17   Oval Linsey, MD  triamcinolone ointment (KENALOG) 0.1 % APPLY TO AFFECTED AREA TWICE DAILY 09/20/16   Tommy Medal, Lavell Islam, MD    Family History Family History  Problem Relation Age of Onset  . Kidney failure Mother   . Diabetes Mother   . Hypertension Mother   . Heart disease Mother   . Glaucoma Father   . Congestive Heart Failure Sister   . Diabetes Sister   . Kidney disease Sister   . Diabetes Brother   . Unexplained death Brother 38       Automobile accident  . Hypothyroidism Daughter   . Arthritis Daughter        Neck/Back  . Healthy Son   . HIV/AIDS Brother   . HIV  Daughter   . Kidney disease Daughter   . Arthritis Son        Knee    Social History Social History   Tobacco Use  . Smoking status: Current Every Day Smoker    Packs/day: 0.50    Years: 50.00    Pack years: 25.00    Types: Cigarettes  . Smokeless tobacco: Never Used  . Tobacco comment: smoking .5 PPD  Substance Use Topics  . Alcohol use: No    Alcohol/week: 0.0 oz    Comment: "last drink of alcohol ~ 1977"  . Drug use: No    Comment: cutting back on chantix, 1 cigarette this morning     Allergies   Hctz [hydrochlorothiazide]   Review of Systems Review of Systems  Unable to perform ROS: Mental status change  Psychiatric/Behavioral: Positive for agitation and confusion.     Physical Exam Updated Vital Signs BP 132/77 (BP Location: Left Arm) Comment: Able to calm patient down for B/P  Pulse (!) 55   Temp 98.3 F (36.8 C) (Oral)   Resp 15   Ht 5\' 7"  (1.702 m)   Wt 59.4 kg (131 lb)   SpO2 98%   BMI 20.52 kg/m   Physical Exam  Constitutional: She appears well-developed and well-nourished.  HENT:  Head: Normocephalic.  Right Ear: External ear normal.  Left Ear: External ear normal.  Mouth/Throat: Oropharynx is clear and moist.  Cardiovascular: Normal rate and regular rhythm.  Pulmonary/Chest: Effort normal and breath sounds normal.  Abdominal: Soft.  Musculoskeletal: Normal range of motion. She exhibits no edema.  Neurological:  Movement of all extremities, confusion   Skin: Skin is warm.  Psychiatric: She has a normal mood and affect.  Nursing note and vitals reviewed.    ED Treatments / Results  Labs (all labs ordered are listed, but only abnormal results are displayed) Labs Reviewed  COMPREHENSIVE METABOLIC PANEL  CBC  URINALYSIS, ROUTINE W REFLEX MICROSCOPIC  CBG MONITORING, ED    EKG EKG Interpretation  Date/Time:  Friday August 17 2017 10:15:40 EDT Ventricular Rate:  50 PR Interval:    QRS Duration: 90 QT Interval:  451 QTC  Calculation: 412 R Axis:   22 Text Interpretation:  Sinus rhythm Borderline short PR interval Abnormal R-wave progression, early transition LVH with secondary repolarization abnormality Since last tracing Rate slower LVH now present on EKG Confirmed by Noemi Chapel [phone]) on 08/17/2017 10:25:00 AM   Radiology No results found.  Procedures Procedures (including critical care time)  Medications Ordered in ED Medications  LORazepam (ATIVAN) injection 1 mg (has no administration in time range)   MDM Pt thrashing in bed, Pt not focusing on any commands, not answering question, cannot comply with exam.  Pt has not had hydrocodone or xanax in 24 hours.  Family concerned about withdrawal.  Pt given IM ativan with no relief.  Pt given Morphine 2mg  with no change.  Pt given Iv ativan in order to obtain ct scans.  Labs and ct reviewed.  Pt had decreased heart rate after ativan to 47.  Pt receiving Iv fluids.    Initial Impression / Assessment and Plan / ED Course  I have reviewed the triage vital signs and the nursing notes.  Pertinent labs & imaging results that were available during my care of the patient were reviewed by me and considered in my medical decision making (see chart for details).       Final Clinical Impressions(s) / ED Diagnoses   Final diagnoses:  Altered mental status, unspecified altered mental status type  Encephalopathy    ED Discharge Orders    None       Sidney Ace 08/17/17 1555    Noemi Chapel, MD 08/18/17 2035    Noemi Chapel, MD 08/18/17 [id number]    Noemi Chapel, MD 08/18/17 2037

## 2017-08-17 NOTE — Progress Notes (Signed)
MD at bedside with this RN and Pt just received Ativan 2 mg and pt has not let up she is still fighting. We added bed rails pads since patient keeps knocking her head on the bed rails. Dr. Wynetta Emery gave one time verbal order at bedside for Haldol 5 mg. He stated he was going to sign off to Dr. Olevia Bowens this evening and will suggest possibly starting patient on precedex to him.   6:29 PM Rendell Thivierge Rica Mote, RN

## 2017-08-17 NOTE — ED Triage Notes (Signed)
Per family member, patient has had generalized weakness x 3 days, worsening since yesterday. States she hasn't eaten much since yesterday and today patient is not talking, fidgety, not walking and confused upon awakening this morning. Patient will not answer questions at triage and is moving constantly. States patient has not had medications since yesterday, does have fentanyl patch to back.

## 2017-08-18 DIAGNOSIS — Z720 Tobacco use: Secondary | ICD-10-CM

## 2017-08-18 LAB — CBC WITH DIFFERENTIAL/PLATELET
BASOS ABS: 0 10*3/uL (ref 0.0–0.1)
BASOS PCT: 0 %
Eosinophils Absolute: 0 10*3/uL (ref 0.0–0.7)
Eosinophils Relative: 0 %
HEMATOCRIT: 44.8 % (ref 36.0–46.0)
Hemoglobin: 15.5 g/dL — ABNORMAL HIGH (ref 12.0–15.0)
Lymphocytes Relative: 34 %
Lymphs Abs: 3.3 10*3/uL (ref 0.7–4.0)
MCH: 33.2 pg (ref 26.0–34.0)
MCHC: 34.6 g/dL (ref 30.0–36.0)
MCV: 95.9 fL (ref 78.0–100.0)
MONOS PCT: 9 %
Monocytes Absolute: 0.8 10*3/uL (ref 0.1–1.0)
NEUTROS ABS: 5.5 10*3/uL (ref 1.7–7.7)
NEUTROS PCT: 57 %
Platelets: 276 10*3/uL (ref 150–400)
RBC: 4.67 MIL/uL (ref 3.87–5.11)
RDW: 13.9 % (ref 11.5–15.5)
WBC: 9.7 10*3/uL (ref 4.0–10.5)

## 2017-08-18 LAB — COMPREHENSIVE METABOLIC PANEL
ALBUMIN: 4.1 g/dL (ref 3.5–5.0)
ALK PHOS: 56 U/L (ref 38–126)
ALT: 71 U/L — ABNORMAL HIGH (ref 14–54)
ANION GAP: 10 (ref 5–15)
AST: 155 U/L — ABNORMAL HIGH (ref 15–41)
BILIRUBIN TOTAL: 0.8 mg/dL (ref 0.3–1.2)
BUN: 13 mg/dL (ref 6–20)
CALCIUM: 9.9 mg/dL (ref 8.9–10.3)
CO2: 25 mmol/L (ref 22–32)
Chloride: 109 mmol/L (ref 101–111)
Creatinine, Ser: 0.64 mg/dL (ref 0.44–1.00)
Glucose, Bld: 150 mg/dL — ABNORMAL HIGH (ref 65–99)
POTASSIUM: 3 mmol/L — AB (ref 3.5–5.1)
Sodium: 144 mmol/L (ref 135–145)
Total Protein: 8.5 g/dL — ABNORMAL HIGH (ref 6.5–8.1)

## 2017-08-18 LAB — AMMONIA: AMMONIA: 40 umol/L — AB (ref 9–35)

## 2017-08-18 LAB — MAGNESIUM: MAGNESIUM: 1.8 mg/dL (ref 1.7–2.4)

## 2017-08-18 LAB — TROPONIN I: Troponin I: <0.03 ng/mL (ref <0.03)

## 2017-08-18 MED ORDER — FENTANYL 12 MCG/HR TD PT72: 12.5000 ug | MEDICATED_PATCH | TRANSDERMAL | Status: DC

## 2017-08-18 MED ORDER — MAGNESIUM SULFATE 2 GM/50ML IV SOLN
2.0000 g | Freq: Once | INTRAVENOUS | Status: AC
  Administered 2017-08-18: 2 g via INTRAVENOUS
  Filled 2017-08-18: qty 50

## 2017-08-18 MED ORDER — ASPIRIN 81 MG PO CHEW
81.0000 mg | CHEWABLE_TABLET | Freq: Every day | ORAL | Status: DC
  Administered 2017-08-19 – 2017-08-22 (×4): 81 mg via ORAL
  Filled 2017-08-18 (×5): qty 1

## 2017-08-18 MED ORDER — BRIMONIDINE TARTRATE 0.2 % OP SOLN
1.0000 [drp] | Freq: Two times a day (BID) | OPHTHALMIC | Status: DC
  Administered 2017-08-18 – 2017-08-22 (×8): 1 [drp] via OPHTHALMIC
  Filled 2017-08-18 (×2): qty 5

## 2017-08-18 MED ORDER — BENAZEPRIL HCL 10 MG PO TABS
40.0000 mg | ORAL_TABLET | Freq: Every day | ORAL | Status: DC
  Administered 2017-08-19 – 2017-08-22 (×4): 40 mg via ORAL
  Filled 2017-08-18 (×5): qty 4

## 2017-08-18 MED ORDER — HYDRALAZINE HCL 20 MG/ML IJ SOLN
10.0000 mg | INTRAMUSCULAR | Status: DC | PRN
  Administered 2017-08-19 – 2017-08-22 (×5): 10 mg via INTRAVENOUS
  Filled 2017-08-18 (×5): qty 1

## 2017-08-18 MED ORDER — DOCUSATE SODIUM 100 MG PO CAPS: 100.0000 mg | ORAL_CAPSULE | Freq: Two times a day (BID) | ORAL | Status: DC

## 2017-08-18 MED ORDER — POTASSIUM CHLORIDE 10 MEQ/100ML IV SOLN
10.0000 meq | INTRAVENOUS | Status: AC
  Administered 2017-08-18 (×4): 10 meq via INTRAVENOUS
  Filled 2017-08-18 (×2): qty 100

## 2017-08-18 MED ORDER — ENOXAPARIN SODIUM 40 MG/0.4ML ~~LOC~~ SOLN
40.0000 mg | SUBCUTANEOUS | Status: DC
  Administered 2017-08-18 – 2017-08-21 (×4): 40 mg via SUBCUTANEOUS
  Filled 2017-08-18 (×4): qty 0.4

## 2017-08-18 MED ORDER — DORZOLAMIDE HCL 2 % OP SOLN
1.0000 [drp] | Freq: Two times a day (BID) | OPHTHALMIC | Status: DC
  Administered 2017-08-18 – 2017-08-22 (×8): 1 [drp] via OPHTHALMIC
  Filled 2017-08-18 (×2): qty 10

## 2017-08-18 MED ORDER — BICTEGRAVIR-EMTRICITAB-TENOFOV 50-200-25 MG PO TABS
1.0000 | ORAL_TABLET | Freq: Every day | ORAL | Status: DC
  Administered 2017-08-19 – 2017-08-22 (×4): 1 via ORAL
  Filled 2017-08-18 (×7): qty 1

## 2017-08-18 MED ORDER — LATANOPROST 0.005 % OP SOLN
1.0000 [drp] | Freq: Every day | OPHTHALMIC | Status: DC
  Administered 2017-08-18 – 2017-08-21 (×4): 1 [drp] via OPHTHALMIC
  Filled 2017-08-18: qty 2.5

## 2017-08-18 MED ORDER — BRIMONIDINE TARTRATE-TIMOLOL 0.2-0.5 % OP SOLN: 1.0000 [drp] | Freq: Two times a day (BID) | OPHTHALMIC | Status: DC

## 2017-08-18 MED ORDER — PANTOPRAZOLE SODIUM 40 MG PO TBEC
40.0000 mg | DELAYED_RELEASE_TABLET | Freq: Every day | ORAL | Status: DC
  Administered 2017-08-19 – 2017-08-22 (×4): 40 mg via ORAL
  Filled 2017-08-18 (×5): qty 1

## 2017-08-18 MED ORDER — TIMOLOL MALEATE 0.5 % OP SOLN
1.0000 [drp] | Freq: Two times a day (BID) | OPHTHALMIC | Status: DC
  Administered 2017-08-18 – 2017-08-22 (×8): 1 [drp] via OPHTHALMIC
  Filled 2017-08-18 (×2): qty 5

## 2017-08-18 MED ORDER — AMLODIPINE BESYLATE 5 MG PO TABS
10.0000 mg | ORAL_TABLET | Freq: Every day | ORAL | Status: DC
  Administered 2017-08-19 – 2017-08-22 (×4): 10 mg via ORAL
  Filled 2017-08-18 (×5): qty 2

## 2017-08-18 MED ORDER — ATENOLOL 25 MG PO TABS
100.0000 mg | ORAL_TABLET | Freq: Every day | ORAL | Status: DC
  Administered 2017-08-19 – 2017-08-22 (×3): 100 mg via ORAL
  Filled 2017-08-18 (×5): qty 4

## 2017-08-18 NOTE — Progress Notes (Signed)
PROGRESS NOTE    Jessica [name]  RXV:[id number]  DOB: 11-23-1940  DOA: 08/17/2017 PCP: Oval Linsey, MD   Brief Admission Hx: Jessica [name] is a 77 y.o. female with an extensive past medical history including chronic pain, opioid dependence, benzodiazepine dependence, chronic constipation, HIV disease, PVD, glaucoma and other comorbidities presented to the emergency department by family members with altered mental status for the past 2 days increasing in lethargy and confusion.   The patient has had combative behaviors. The patient was noted to have a UTI.  Pt also was positive for THC.  Pt admitted to SDU.    MDM/Assessment & Plan:   1. Acute toxic encephalopathy-unknown cause, however UDS was positive for THC, it was possibly laced with an unknown substance, pt has slowly showed some improvement.  The patient admitted to the stepdown unit so that she could be monitored closely. Continue supportive care with IV fluids.  Aspiration precautions.  Neurochecks ordered.  Adult EEG ordered.  MRI brain without contrast negative for acute findings.   Neurology consultation requested.  The patient is afebrile with a supple neck and normal white blood cell count therefore low suspicion for meningitis at this time.  Continue to monitor closely.  Follow blood cultures.  Ammonia level slightly elevated.  Urinalysis positive for infection, treating with antibiotics.  2. UTI - IV ceftriaxone ordered, follow urine culture.  3. Sinus bradycardia-slightly improved, TSH pending, family is not aware of any past history of this level of bradycardia.  We will continue to monitor on telemetry closely.  Hopefully will improve with supportive therapy, IV fluids.  4. Chronic pain-patient is dependent on opioids, would continue fentanyl patch that she has been taking for many years per daughter. 5. Benzodiazepine dependence- IV medications ordered as needed.  Follow and monitor closely, hold for  sedation. 6. Acute renal failure-Resolved now after treating with IV fluid hydration. 7. Hypernatremia-Resolved. Secondary to dehydration, treated with hypotonic fluids.  8. Chronic constipation secondary to chronic opioid use- resumed home laxatives.  DVT Prophylaxis: Lovenox Code Status: Full Family Communication: Daughter and nephew Disposition Plan: Inpatient care in the stepdown unit for intensive medical therapy  Subjective: Pt remains very agitated, not resting well, not interactive, delirious, vocalizing more today than yesterday  Objective: Vitals:   08/18/17 0500 08/18/17 0530 08/18/17 0600 08/18/17 0641  BP: (!) 181/64 (!) 189/57  (!) 162/71  Pulse: 78 76 67 69  Resp: 14 10 12  (!) 8  Temp:      TempSrc:      SpO2: 100% 100% 100% 100%  Weight:      Height:        Intake/Output Summary (Last 24 hours) at 08/18/2017 0826 Last data filed at 08/18/2017 0530 Gross per 24 hour  Intake 1962.5 ml  Output 1200 ml  Net 762.5 ml   Filed Weights   08/17/17 1015 08/18/17 0400  Weight: 59.4 kg (131 lb) 55.1 kg (121 lb 7.6 oz)     REVIEW OF SYSTEMS  As per history otherwise all reviewed and reported negative  Exam:  General exam: thin, chronically ill appearing female, awake but confused, encephalopathic, nonresponsive to all except name Respiratory system: Clear. No increased work of breathing. Cardiovascular system: S1 & S2 heard. No JVD, murmurs, gallops, clicks or pedal edema. Gastrointestinal system: Abdomen is nondistended, soft and nontender. Normal bowel sounds heard. Central nervous system: encephalopathic. No focal neurological deficits. Extremities: no CCE.  Data Reviewed: Basic Metabolic Panel: Recent Labs  Lab 08/17/17 1119 08/18/17 0502  NA 146* 144  K 3.3* 3.0*  CL 111 109  CO2 22 25  GLUCOSE 113* 150*  BUN 25* 13  CREATININE 1.56* 0.64  CALCIUM 9.7 9.9  MG  --  1.8   Liver Function Tests: Recent Labs  Lab 08/17/17 1119 08/18/17 0502    AST 148* 155*  ALT 58* 71*  ALKPHOS 55 56  BILITOT 1.2 0.8  PROT 8.1 8.5*  ALBUMIN 4.0 4.1   No results for input(s): LIPASE, AMYLASE in the last 168 hours. Recent Labs  Lab 08/17/17 1329 08/18/17 0502  AMMONIA 34 40*   CBC: Recent Labs  Lab 08/17/17 1119 08/18/17 0502  WBC 10.4 9.7  NEUTROABS  --  5.5  HGB 13.7 15.5*  HCT 41.8 44.8  MCV 97.2 95.9  PLT 224 276   Cardiac Enzymes: Recent Labs  Lab 08/17/17 1719 08/17/17 2158 08/18/17 0502  TROPONINI 0.03* 0.03* <0.03   CBG (last 3)  Recent Labs    08/17/17 1055 08/17/17 2210  GLUCAP 117* 181*   Recent Results (from the past 240 hour(s))  MRSA PCR Screening     Status: None   Collection Time: 08/17/17  4:23 PM  Result Value Ref Range Status   MRSA by PCR NEGATIVE NEGATIVE Final    Comment:        The GeneXpert MRSA Assay (FDA approved for NASAL specimens only), is one component of a comprehensive MRSA colonization surveillance program. It is not intended to diagnose MRSA infection nor to guide or monitor treatment for MRSA infections. Performed at [facility], [address], [place], [place] [address]   Culture, blood (Routine X 2) w Reflex to ID Panel     Status: None (Preliminary result)   Collection Time: 08/17/17  5:17 PM  Result Value Ref Range Status   Specimen Description LEFT ANTECUBITAL  Final   Special Requests   Final    BOTTLES DRAWN AEROBIC AND ANAEROBIC Blood Culture adequate volume Performed at [facility], [address] [place], [place] [address]    Culture PENDING  Incomplete   Report Status PENDING  Incomplete  Culture, blood (Routine X 2) w Reflex to ID Panel     Status: None (Preliminary result)   Collection Time: 08/17/17  5:21 PM  Result Value Ref Range Status   Specimen Description BLOOD RIGHT ARM  Final   Special Requests   Final    BOTTLES DRAWN AEROBIC ONLY Blood Culture results may not be optimal due to an inadequate volume of blood received in culture  bottles Performed at [facility], [address], [place], [place] [address]    Culture PENDING  Incomplete   Report Status PENDING  Incomplete     Studies: Ct Head Wo Contrast  Result Date: 08/17/2017 CLINICAL DATA:  Generalize weakness over the last 3 days. Confusion. EXAM: CT HEAD WITHOUT CONTRAST TECHNIQUE: Contiguous axial images were obtained from the base of the skull through the vertex without intravenous contrast. COMPARISON:  07/27/2013 FINDINGS: Brain: Generalized atrophy. No evidence of old or acute focal infarction, mass lesion, hemorrhage, hydrocephalus or extra-axial collection. Vascular: There is atherosclerotic calcification of the major vessels at the base of the brain. Skull: Negative Sinuses/Orbits: Clear/normal Other: None IMPRESSION: No acute or reversible finding.  Age related atrophy. Electronically Signed   By: Nelson Chimes M.D.   On: 08/17/2017 13:10   Ct Chest W Contrast  Result Date: 08/17/2017 CLINICAL DATA:  Generalized weakness for the past  3 days.  Smoker. EXAM: CT CHEST WITH CONTRAST TECHNIQUE: Multidetector CT imaging of the chest was performed during intravenous contrast administration. CONTRAST:  71mL OMNIPAQUE IOHEXOL 300 MG/ML  SOLN COMPARISON:  Chest radiographs dated 01/09/2015 and chest CTA dated 02/03/2012. FINDINGS: Cardiovascular: Atheromatous calcifications, including the coronary arteries and aorta. Mildly enlarged heart. Mediastinum/Nodes: No enlarged lymph nodes. 9 mm left lobe thyroid nodule. Tiny right lobe nodules. Lungs/Pleura: Mild bilateral dependent atelectasis. Mild bilateral bullous changes and mild peribronchial thickening. No lung nodules or pleural fluid. Upper Abdomen: Cholecystectomy clips. Musculoskeletal: Mild thoracic and lower cervical spine degenerative changes. IMPRESSION: 1. No acute abnormality. 2.  Calcific coronary artery and aortic atherosclerosis. 3. Mild changes of COPD and chronic bronchitis. 4. Mild cardiomegaly. Aortic  Atherosclerosis (ICD10-I70.0) and Emphysema (ICD10-J43.9). Electronically Signed   By: Claudie Revering M.D.   On: 08/17/2017 13:30   Mr Brain Wo Contrast  Result Date: 08/17/2017 CLINICAL DATA:  Altered level of consciousness.  Weakness for 3 days EXAM: MRI HEAD WITHOUT CONTRAST TECHNIQUE: Multiplanar, multiecho pulse sequences of the brain and surrounding structures were obtained without intravenous contrast. COMPARISON:  Head CT from earlier today.  Brain MRI 07/23/2011 FINDINGS: Brain: No acute infarction, hemorrhage, hydrocephalus, extra-axial collection or mass lesion. Small remote left cerebellar infarct. Age normal brain volume. Vascular: Major flow voids are preserved Skull and upper cervical spine: No evidence of marrow lesion Sinuses/Orbits: Negative Other: For base of motion degradation, reportedly best obtainable. IMPRESSION: 1. No acute finding. 2. Small remote left cerebellar infarct.  Otherwise age normal exam. 3. Motion degraded. Electronically Signed   By: Monte Fantasia M.D.   On: 08/17/2017 15:49   Scheduled Meds: . docusate sodium  100 mg Oral BID  . enoxaparin (LOVENOX) injection  30 mg Subcutaneous Q24H   Continuous Infusions: . cefTRIAXone (ROCEPHIN)  IV Stopped (08/17/17 1806)  . dextrose 75 mL/hr at 08/18/17 0130  . magnesium sulfate 1 - 4 g bolus IVPB    . potassium chloride      Principal Problem:   Toxic encephalopathy Active Problems:   Human immunodeficiency virus disease ([place])   Genital herpes   Glaucoma of left eye   Essential hypertension   Constipation due to pain medication   Tobacco abuse   Generalized anxiety disorder   UTI (urinary tract infection)   Bilateral sensorineural hearing loss   Long-term current use of opiate analgesic   Acute renal failure (ARF) ([facility])   Hypernatremia   Dehydration   Benzodiazepine dependence ([facility])   Sinus bradycardia  Critical Care Time spent: 35 minutes  Irwin Brakeman, MD, FAAFP [facility] Pager  [phone]  If 7PM-7AM, please contact night-coverage [email or web address] Password [email or web address] 08/18/2017, 8:26 AM    LOS: 1 day

## 2017-08-18 NOTE — Progress Notes (Signed)
SLP Cancellation Note  Patient Details Name: Jessica [name] MRN: [id number] DOB: November 28, 1940   Cancelled treatment:       Reason Eval/Treat Not Completed: Medical issues which prohibited therapy. RN indicated pt likely not ready for bedside swallow eval today given cognitive status. Will f/u as status improves. Please contact me at number below if pt status improves this date to the point of being able to participate in evaluation.   Kern Reap, [place], CCC-SLP 08/18/2017, 8:43 AM  [phone]

## 2017-08-19 DIAGNOSIS — E876 Hypokalemia: Secondary | ICD-10-CM

## 2017-08-19 LAB — CBC WITH DIFFERENTIAL/PLATELET
BASOS ABS: 0 10*3/uL (ref 0.0–0.1)
BASOS PCT: 0 %
EOS PCT: 0 %
Eosinophils Absolute: 0 10*3/uL (ref 0.0–0.7)
HCT: 42.1 % (ref 36.0–46.0)
Hemoglobin: 14.4 g/dL (ref 12.0–15.0)
LYMPHS PCT: 26 %
Lymphs Abs: 2.3 10*3/uL (ref 0.7–4.0)
MCH: 32.1 pg (ref 26.0–34.0)
MCHC: 34.2 g/dL (ref 30.0–36.0)
MCV: 93.8 fL (ref 78.0–100.0)
Monocytes Absolute: 0.5 10*3/uL (ref 0.1–1.0)
Monocytes Relative: 6 %
NEUTROS ABS: 6.2 10*3/uL (ref 1.7–7.7)
Neutrophils Relative %: 68 %
PLATELETS: 288 10*3/uL (ref 150–400)
RBC: 4.49 MIL/uL (ref 3.87–5.11)
RDW: 13.6 % (ref 11.5–15.5)
WBC: 9 10*3/uL (ref 4.0–10.5)

## 2017-08-19 LAB — TSH: TSH: 3.593 u[IU]/mL (ref 0.350–4.500)

## 2017-08-19 LAB — COMPREHENSIVE METABOLIC PANEL
ALT: 63 U/L — ABNORMAL HIGH (ref 14–54)
AST: 116 U/L — ABNORMAL HIGH (ref 15–41)
Albumin: 3.8 g/dL (ref 3.5–5.0)
Alkaline Phosphatase: 52 U/L (ref 38–126)
Anion gap: 11 (ref 5–15)
BUN: 6 mg/dL (ref 6–20)
CHLORIDE: 107 mmol/L (ref 101–111)
CO2: 21 mmol/L — ABNORMAL LOW (ref 22–32)
Calcium: 9.7 mg/dL (ref 8.9–10.3)
Creatinine, Ser: 0.56 mg/dL (ref 0.44–1.00)
GFR calc Af Amer: >60 mL/min (ref >60)
Glucose, Bld: 122 mg/dL — ABNORMAL HIGH (ref 65–99)
POTASSIUM: 3 mmol/L — AB (ref 3.5–5.1)
Sodium: 139 mmol/L (ref 135–145)
TOTAL PROTEIN: 8 g/dL (ref 6.5–8.1)
Total Bilirubin: 1.4 mg/dL — ABNORMAL HIGH (ref 0.3–1.2)

## 2017-08-19 LAB — MAGNESIUM: MAGNESIUM: 1.9 mg/dL (ref 1.7–2.4)

## 2017-08-19 MED ORDER — POTASSIUM CHLORIDE IN NACL 20-0.9 MEQ/L-% IV SOLN
INTRAVENOUS | Status: DC
  Administered 2017-08-19 – 2017-08-22 (×4): via INTRAVENOUS

## 2017-08-19 MED ORDER — POTASSIUM CHLORIDE 10 MEQ/100ML IV SOLN
10.0000 meq | INTRAVENOUS | Status: AC
  Administered 2017-08-19 (×4): 10 meq via INTRAVENOUS
  Filled 2017-08-19 (×2): qty 100

## 2017-08-19 MED ORDER — SODIUM CHLORIDE 0.9 % IV SOLN
INTRAVENOUS | Status: DC
  Administered 2017-08-19: 09:00:00 via INTRAVENOUS

## 2017-08-19 NOTE — Progress Notes (Signed)
PROGRESS NOTE    Jessica [name]  XBD:[id number]  DOB: 03/10/1941  DOA: 08/17/2017 PCP: Oval Linsey, MD   Brief Admission Hx: Jessica [name] is a 77 y.o. female with an extensive past medical history including chronic pain, opioid dependence, benzodiazepine dependence, chronic constipation, HIV disease, PVD, glaucoma and other comorbidities presented to the emergency department by family members with altered mental status for the past 2 days increasing in lethargy and confusion.   The patient has had combative behaviors. The patient was noted to have a UTI.  Pt also was positive for THC.  Pt admitted to SDU.    MDM/Assessment & Plan:   1. Acute toxic encephalopathy-unknown primary cause, however UDS was positive for THC, it could have been laced with an unknown substance, pt slowly improving daily.  The patient admitted to the stepdown unit so that she could be monitored closely. Continue supportive care with IV fluids.  Aspiration precautions.  Neurochecks ordered.  Adult EEG ordered.  MRI brain without contrast negative for acute findings.   Neurology consultation requested.  The patient is afebrile with a supple neck and normal white blood cell count therefore low suspicion for meningitis at this time.  Continue to monitor closely.  Follow blood cultures.  Ammonia level slightly elevated.  Urinalysis positive for infection, treating with antibiotics.  I am concerned about withdrawal, possibly alcohol but family denies this.  2. UTI - IV ceftriaxone ordered, follow urine culture.  3. Hypokalemia - replace in IVFs, magnesium given as well.  Recheck in AM.  4. Sinus bradycardia-slightly improved, TSH pending, family is not aware of any past history of this level of bradycardia.  We will continue to monitor on telemetry closely.  Hopefully will improve with supportive therapy, IV fluids.  5. Chronic pain-patient is dependent on opioids, interestingly, fentanyl patch has been off  last couple of days and no complaints of pain, IV morphine ordered as needed.   6. Benzodiazepine dependence- IV medications ordered as needed.  Follow and monitor closely, hold for sedation. 7. Acute renal failure-Resolved now after treating with IV fluid hydration. 8. Hypernatremia-Resolved. Secondary to dehydration, treated with hypotonic fluids.  9. HIV disease - resume home medication.  10. Chronic constipation secondary to chronic opioid use- resumed laxatives. No BM yet, try suppository.   DVT Prophylaxis: Lovenox Code Status: Full Family Communication: Daughter and nephew Disposition Plan: Inpatient care in the stepdown unit for intensive medical therapy  Subjective: Pt remains confused, but much less agitated and much more verbal and more periods of lucency, pt denies pain or discomfort, says she could eat today.    Objective: Vitals:   08/19/17 0400 08/19/17 0500 08/19/17 0801 08/19/17 0913  BP: (!) 156/63 133/65  (!) 157/70  Pulse: 72 85    Resp: 17 (!) 22    Temp:   97.7 F (36.5 C)   TempSrc:   Oral   SpO2: 100% 100%    Weight:      Height:        Intake/Output Summary (Last 24 hours) at 08/19/2017 1115 Last data filed at 08/19/2017 0323 Gross per 24 hour  Intake 1121 ml  Output 1050 ml  Net 71 ml   Filed Weights   08/17/17 1015 08/18/17 0400  Weight: 59.4 kg (131 lb) 55.1 kg (121 lb 7.6 oz)   REVIEW OF SYSTEMS  As per history otherwise all reviewed and reported negative  Exam:  General exam: thin, chronically ill appearing female, awake but  confused, encephalopathic, nonresponsive to all except name Respiratory system: Clear. No increased work of breathing. Cardiovascular system: S1 & S2 heard. No JVD, murmurs, gallops, clicks or pedal edema. Gastrointestinal system: Abdomen is nondistended, soft and nontender. Normal bowel sounds heard. Central nervous system: encephalopathic. No focal neurological deficits. Extremities: no CCE.  Data Reviewed: Basic  Metabolic Panel: Recent Labs  Lab 08/17/17 1119 08/18/17 0502 08/19/17 0827  NA 146* 144 139  K 3.3* 3.0* 3.0*  CL 111 109 107  CO2 22 25 21*  GLUCOSE 113* 150* 122*  BUN 25* 13 6  CREATININE 1.56* 0.64 0.56  CALCIUM 9.7 9.9 9.7  MG  --  1.8 1.9   Liver Function Tests: Recent Labs  Lab 08/17/17 1119 08/18/17 0502 08/19/17 0827  AST 148* 155* 116*  ALT 58* 71* 63*  ALKPHOS 55 56 52  BILITOT 1.2 0.8 1.4*  PROT 8.1 8.5* 8.0  ALBUMIN 4.0 4.1 3.8   No results for input(s): LIPASE, AMYLASE in the last 168 hours. Recent Labs  Lab 08/17/17 1329 08/18/17 0502  AMMONIA 34 40*   CBC: Recent Labs  Lab 08/17/17 1119 08/18/17 0502 08/19/17 0827  WBC 10.4 9.7 9.0  NEUTROABS  --  5.5 6.2  HGB 13.7 15.5* 14.4  HCT 41.8 44.8 42.1  MCV 97.2 95.9 93.8  PLT 224 276 288   Cardiac Enzymes: Recent Labs  Lab 08/17/17 1719 08/17/17 2158 08/18/17 0502  TROPONINI 0.03* 0.03* <0.03   CBG (last 3)  Recent Labs    08/17/17 1055 08/17/17 2210  GLUCAP 117* 181*   Recent Results (from the past 240 hour(s))  Urine culture     Status: None (Preliminary result)   Collection Time: 08/17/17  4:00 PM  Result Value Ref Range Status   Specimen Description   Final    URINE, CLEAN CATCH Performed at [facility], [address], [place], [place] [address]    Special Requests   Final    NONE Performed at [facility], [address] [place], [place] [address]    Culture   Final    CULTURE REINCUBATED FOR BETTER GROWTH Performed at [facility], [place] [address], [place], [place] [address]    Report Status PENDING  Incomplete  MRSA PCR Screening     Status: None   Collection Time: 08/17/17  4:23 PM  Result Value Ref Range Status   MRSA by PCR NEGATIVE NEGATIVE Final    Comment:        The GeneXpert MRSA Assay (FDA approved for NASAL specimens only), is one component of a comprehensive MRSA colonization surveillance program. It is not intended to diagnose  MRSA infection nor to guide or monitor treatment for MRSA infections. Performed at [facility], [address], [place], [place] [address]   Culture, blood (Routine X 2) w Reflex to ID Panel     Status: None (Preliminary result)   Collection Time: 08/17/17  5:17 PM  Result Value Ref Range Status   Specimen Description LEFT ANTECUBITAL  Final   Special Requests   Final    BOTTLES DRAWN AEROBIC AND ANAEROBIC Blood Culture adequate volume   Culture   Final    NO GROWTH < 24 HOURS Performed at [facility], [address] [place], [place] [address]    Report Status PENDING  Incomplete  Culture, blood (Routine X 2) w Reflex to ID Panel     Status: None (Preliminary result)   Collection Time: 08/17/17  5:21 PM  Result Value Ref Range Status   Specimen Description BLOOD RIGHT ARM  Final   Special Requests   Final    BOTTLES DRAWN AEROBIC ONLY Blood Culture results may not be optimal due to an inadequate volume of blood received in culture bottles   Culture   Final    NO GROWTH < 24 HOURS Performed at [facility], [address] [place], [place] [address]    Report Status PENDING  Incomplete     Studies: Ct Head Wo Contrast  Result Date: 08/17/2017 CLINICAL DATA:  Generalize weakness over the last 3 days. Confusion. EXAM: CT HEAD WITHOUT CONTRAST TECHNIQUE: Contiguous axial images were obtained from the base of the skull through the vertex without intravenous contrast. COMPARISON:  07/27/2013 FINDINGS: Brain: Generalized atrophy. No evidence of old or acute focal infarction, mass lesion, hemorrhage, hydrocephalus or extra-axial collection. Vascular: There is atherosclerotic calcification of the major vessels at the base of the brain. Skull: Negative Sinuses/Orbits: Clear/normal Other: None IMPRESSION: No acute or reversible finding.  Age related atrophy. Electronically Signed   By: Nelson Chimes M.D.   On: 08/17/2017 13:10   Ct Chest W Contrast  Result Date: 08/17/2017 CLINICAL DATA:   Generalized weakness for the past 3 days.  Smoker. EXAM: CT CHEST WITH CONTRAST TECHNIQUE: Multidetector CT imaging of the chest was performed during intravenous contrast administration. CONTRAST:  79mL OMNIPAQUE IOHEXOL 300 MG/ML  SOLN COMPARISON:  Chest radiographs dated 01/09/2015 and chest CTA dated 02/03/2012. FINDINGS: Cardiovascular: Atheromatous calcifications, including the coronary arteries and aorta. Mildly enlarged heart. Mediastinum/Nodes: No enlarged lymph nodes. 9 mm left lobe thyroid nodule. Tiny right lobe nodules. Lungs/Pleura: Mild bilateral dependent atelectasis. Mild bilateral bullous changes and mild peribronchial thickening. No lung nodules or pleural fluid. Upper Abdomen: Cholecystectomy clips. Musculoskeletal: Mild thoracic and lower cervical spine degenerative changes. IMPRESSION: 1. No acute abnormality. 2.  Calcific coronary artery and aortic atherosclerosis. 3. Mild changes of COPD and chronic bronchitis. 4. Mild cardiomegaly. Aortic Atherosclerosis (ICD10-I70.0) and Emphysema (ICD10-J43.9). Electronically Signed   By: Claudie Revering M.D.   On: 08/17/2017 13:30   Mr Brain Wo Contrast  Result Date: 08/17/2017 CLINICAL DATA:  Altered level of consciousness.  Weakness for 3 days EXAM: MRI HEAD WITHOUT CONTRAST TECHNIQUE: Multiplanar, multiecho pulse sequences of the brain and surrounding structures were obtained without intravenous contrast. COMPARISON:  Head CT from earlier today.  Brain MRI 07/23/2011 FINDINGS: Brain: No acute infarction, hemorrhage, hydrocephalus, extra-axial collection or mass lesion. Small remote left cerebellar infarct. Age normal brain volume. Vascular: Major flow voids are preserved Skull and upper cervical spine: No evidence of marrow lesion Sinuses/Orbits: Negative Other: For base of motion degradation, reportedly best obtainable. IMPRESSION: 1. No acute finding. 2. Small remote left cerebellar infarct.  Otherwise age normal exam. 3. Motion degraded.  Electronically Signed   By: Monte Fantasia M.D.   On: 08/17/2017 15:49   Scheduled Meds: . amLODipine  10 mg Oral Daily  . aspirin  81 mg Oral Daily  . atenolol  100 mg Oral Daily  . benazepril  40 mg Oral Daily  . bictegravir-emtricitabine-tenofovir AF  1 tablet Oral Daily  . brimonidine  1 drop Left Eye BID   And  . timolol  1 drop Left Eye BID  . docusate sodium  100 mg Oral BID  . dorzolamide  1 drop Both Eyes BID  . enoxaparin (LOVENOX) injection  40 mg Subcutaneous Q24H  . latanoprost  1 drop Both Eyes QHS  . pantoprazole  40 mg Oral Daily   Continuous Infusions: . 0.9 % NaCl with KCl 20 mEq / L    . cefTRIAXone (ROCEPHIN)  IV Stopped (08/18/17 1745)  . potassium chloride      Principal Problem:   Toxic encephalopathy Active Problems:   Human immunodeficiency virus disease ([place])   Genital herpes   Glaucoma of left eye   Essential hypertension   Constipation due to pain medication   Tobacco abuse   Generalized anxiety disorder   UTI (urinary tract infection)   Bilateral sensorineural hearing loss   Long-term current use of opiate analgesic   Acute renal failure (ARF) (H[facility])   Hypernatremia   Dehydration   Benzodiazepine dependence ([facility])   Sinus bradycardia  Critical Care Time spent: 33 minutes  Irwin Brakeman, MD, FAAFP [facility] Pager [phone]  If 7PM-7AM, please contact night-coverage [email or web address] Password [email or web address] 08/19/2017, 11:15 AM    LOS: 2 days

## 2017-08-20 ENCOUNTER — Inpatient Hospital Stay (HOSPITAL_COMMUNITY): Payer: Medicare HMO

## 2017-08-20 ENCOUNTER — Other Ambulatory Visit: Payer: Self-pay | Admitting: Internal Medicine

## 2017-08-20 DIAGNOSIS — K5903 Drug induced constipation: Secondary | ICD-10-CM

## 2017-08-20 DIAGNOSIS — G92 Toxic encephalopathy: Secondary | ICD-10-CM

## 2017-08-20 DIAGNOSIS — B20 Human immunodeficiency virus [HIV] disease: Secondary | ICD-10-CM

## 2017-08-20 DIAGNOSIS — N390 Urinary tract infection, site not specified: Principal | ICD-10-CM

## 2017-08-20 DIAGNOSIS — I10 Essential (primary) hypertension: Secondary | ICD-10-CM

## 2017-08-20 DIAGNOSIS — F411 Generalized anxiety disorder: Secondary | ICD-10-CM

## 2017-08-20 DIAGNOSIS — F1320 Sedative, hypnotic or anxiolytic dependence, uncomplicated: Secondary | ICD-10-CM

## 2017-08-20 DIAGNOSIS — N179 Acute kidney failure, unspecified: Secondary | ICD-10-CM

## 2017-08-20 DIAGNOSIS — E870 Hyperosmolality and hypernatremia: Secondary | ICD-10-CM

## 2017-08-20 LAB — CBC WITH DIFFERENTIAL/PLATELET
Basophils Absolute: 0.1 10*3/uL (ref 0.0–0.1)
Basophils Relative: 1 %
Eosinophils Absolute: 0 10*3/uL (ref 0.0–0.7)
Eosinophils Relative: 0 %
HEMATOCRIT: 42.1 % (ref 36.0–46.0)
HEMOGLOBIN: 14.2 g/dL (ref 12.0–15.0)
LYMPHS ABS: 3.5 10*3/uL (ref 0.7–4.0)
LYMPHS PCT: 41 %
MCH: 31.8 pg (ref 26.0–34.0)
MCHC: 33.7 g/dL (ref 30.0–36.0)
MCV: 94.4 fL (ref 78.0–100.0)
MONOS PCT: 7 %
Monocytes Absolute: 0.6 10*3/uL (ref 0.1–1.0)
NEUTROS ABS: 4.4 10*3/uL (ref 1.7–7.7)
Neutrophils Relative %: 51 %
Platelets: 302 10*3/uL (ref 150–400)
RBC: 4.46 MIL/uL (ref 3.87–5.11)
RDW: 13.7 % (ref 11.5–15.5)
WBC: 8.5 10*3/uL (ref 4.0–10.5)

## 2017-08-20 LAB — VITAMIN B12: VITAMIN B 12: 193 pg/mL (ref 180–914)

## 2017-08-20 LAB — COMPREHENSIVE METABOLIC PANEL
ALBUMIN: 3.7 g/dL (ref 3.5–5.0)
ALT: 58 U/L — ABNORMAL HIGH (ref 14–54)
AST: 94 U/L — AB (ref 15–41)
Alkaline Phosphatase: 51 U/L (ref 38–126)
Anion gap: 10 (ref 5–15)
BILIRUBIN TOTAL: 1.1 mg/dL (ref 0.3–1.2)
BUN: 8 mg/dL (ref 6–20)
CHLORIDE: 112 mmol/L — AB (ref 101–111)
CO2: 20 mmol/L — AB (ref 22–32)
Calcium: 9.5 mg/dL (ref 8.9–10.3)
Creatinine, Ser: 0.53 mg/dL (ref 0.44–1.00)
GFR calc Af Amer: >60 mL/min (ref >60)
GFR calc non Af Amer: >60 mL/min (ref >60)
GLUCOSE: 121 mg/dL — AB (ref 65–99)
POTASSIUM: 3.5 mmol/L (ref 3.5–5.1)
SODIUM: 142 mmol/L (ref 135–145)
TOTAL PROTEIN: 7.5 g/dL (ref 6.5–8.1)

## 2017-08-20 LAB — URINE CULTURE

## 2017-08-20 LAB — MAGNESIUM: Magnesium: 1.8 mg/dL (ref 1.7–2.4)

## 2017-08-20 MED ORDER — NITROFURANTOIN MONOHYD MACRO 100 MG PO CAPS
100.0000 mg | ORAL_CAPSULE | Freq: Two times a day (BID) | ORAL | Status: DC
Start: 2017-08-20 — End: 2017-08-22
  Administered 2017-08-20 – 2017-08-22 (×5): 100 mg via ORAL
  Filled 2017-08-20 (×5): qty 1

## 2017-08-20 MED ORDER — POLYETHYLENE GLYCOL 3350 17 G PO PACK
17.0000 g | PACK | Freq: Every day | ORAL | Status: DC
  Administered 2017-08-21 – 2017-08-22 (×2): 17 g via ORAL
  Filled 2017-08-20 (×2): qty 1

## 2017-08-20 NOTE — Consult Note (Signed)
Jessica A. Merlene Laughter, MD     [email or web address]          Jessica [name] is an 77 y.o. female.   ASSESSMENT/PLAN: 1. Acute encephalopathy that is improving:  Etiology is likely due to acute renal failure and acute urinary tract infection.  No evidence of primary CNS infections, infections related to HIV or stroke.  Seizures are unlikely.  An EEG will be obtained.  These labs will be obtain.  Will follow the patient progressed.  2.  Remote small lacunar infarct left cerebellar region: Consider low-dose aspirin 81 mg.   The patient is a 77 year old black female who presents with a 3 day history of confusion and unresponsiveness.  The nurses report that she was mostly unresponsive and the drowsy over last few days.  She typically lives independently and is quite functional and active.  She is able to function well at baseline.  She is on multiple medications but apparently there had been no recent changes in medications.  She has not been eating well and may have not been taking her medications they 3 days that she was confusion unresponsive.  No clear focal weakness are reported.  No clear loss of consciousness or syncope or convulsions noted.  The nursing staff reports that over the last day or to her unresponsiveness has improved.  She is not taking her medications had and is communicating although confused.  Review of systems very limited given the confusion but seems otherwise unrevealing.  Workup has been significant for UTI positive E coli and elevated creatinine over baseline.        GENERAL:   This is a very pleasant thin female who is in no acute distress.  HEENT:   Neck is supple and no evidence of trauma appreciated.  ABDOMEN: Soft  EXTREMITIES: No edema   BACK: Normal alignment.  SKIN: Normal by inspection.    MENTAL STATUS:  She is awake and alert.  She does cooperate with the evaluation.  She is disoriented and has nonsensical speech such as  talking about wedding that she is currently participating in.  She knows that she is in the hospital at [facility] but not oriented to time.  CRANIAL NERVES: Pupils L 18m and R 463mboth round and reactive to light and accommodation; extraocular movements are full, there is no significant nystagmus; upper and lower facial muscles are normal in strength and symmetric, there is no flattening of the nasolabial folds; tongue is midline; uvula is midline; shoulder elevation is normal.   Visual fields are intact.  MOTOR: Normal tone, bulk and strength; no pronator drift.  COORDINATION: Left finger to nose is normal, right finger to nose is normal, No rest tremor; no intention tremor; no postural tremor; no bradykinesia.  REFLEXES: Deep tendon reflexes are symmetrical and normal. Plantar responses are flexor bilaterally.   SENSATION: Normal to light touch and temperature.     Blood pressure (!) 181/71, pulse 71, temperature (!) 97.3 F (36.3 C), temperature source Oral, resp. rate 15, height '5\' 7"'  (1.702 m), weight 121 lb 7.6 oz (55.1 kg), SpO2 97 %.  Past Medical History:  Diagnosis Date  . Acute renal failure (ARF) ([place]6/09/2017  . Anxiety 04/05/2012  . Bilateral sensorineural hearing loss 06/11/2014   Mild to moderate on the left side and slight to mild on the right side per audiometry 05/2014.  Hearing aides with possible masking of tinnitus recommended but patient wished to defer secondary to finances.  .Marland Kitchen  Blood transfusion without reported diagnosis    pt denies  . Bursitis of right shoulder 07/12/2012   s/p shoulder injection 07/12/2012   . Cataract of right eye   . Constipation due to pain medication 04/27/2010  . Diverticulosis 02/08/2012   Extensive left-sided diverticula on colonoscopy March 2012 per Dr. Gala Romney   . Essential hypertension 07/20/2006  . Genital herpes 07/20/2006  . 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 Heart murmur 1961  . Human immunodeficiency virus disease ([place]) 03/27/1986  .  Hyperlipidemia LDL goal < 100 04/05/2012  . Long-term current use of opiate analgesic 03/17/2016  . Lumbar degenerative disc disease 07/20/2006   With chronic back pain   . Marijuana use 07/03/2016  . Micturition syncope 09/20/2015  . 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[place]) 11/01/2011   s/p aortobifem bypass 2009   . Periumbilical hernia 04/19/621   1 cm left periumbilical abdominal wall defect  . 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 Right rotator cuff tear 02/01/2013   Responds to periodic steroid injections  . Seborrhea 09/01/2010  . Small bowel obstruction due to adhesions ([place]) 02/08/2012   s/p Exploratory laparotomy, lysis of adhesions 02/12/12    . 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 Tobacco abuse 02/19/2012  . Tobacco abuse   . Vasovagal syncope 02/15/2015  . Voiding dysfunction    s/p cystoscopy and meatal dilation Dec 2005    Past Surgical History:  Procedure Laterality Date  . ABDOMINAL HYSTERECTOMY    . AORTO-FEMORAL BYPASS GRAFT  04/2007  . APPENDECTOMY    . BREAST SURGERY     Breast biopsy: negative  . CHOLECYSTECTOMY    . COLECTOMY  01/2011   Dr. Margot Chimes; "took out 12 inches of small intestiines and removed blockage"  . EYE SURGERY    . LAPAROTOMY  02/12/2012   Procedure: EXPLORATORY LAPAROTOMY;  Surgeon: Stark Klein, MD;  Location: [facility] OR;  Service: General;  Laterality: N/A;  Exploratory Laparotomy, lysis of adhesions  . SMALL INTESTINE SURGERY      Family History  Problem Relation Age of Onset  . Kidney failure Mother   . Diabetes Mother   . Hypertension Mother   . Heart disease Mother   . Glaucoma Father   . Congestive Heart Failure Sister   . Diabetes Sister   . Kidney disease Sister   . Diabetes Brother   . Unexplained death Brother 10       Automobile accident  . Hypothyroidism Daughter   . Arthritis Daughter        Neck/Back  . Healthy Son   . HIV/AIDS Brother   . HIV Daughter     . Kidney disease Daughter   . Arthritis Son        Knee    Social History:  reports that she has been smoking cigarettes.  She has a 25.00 pack-year smoking history. She has never used smokeless tobacco. She reports that she does not drink alcohol or use drugs.  Allergies:  Allergies  Allergen Reactions  . Hctz [Hydrochlorothiazide] Other (See Comments)    Dizziness, syncope. Does not wish to take anymore    Medications: Prior to Admission medications   Medication Sig Start Date End Date Taking? Authorizing Provider  alendronate (FOSAMAX) 70 MG tablet Take 1 tablet (70 mg total) by mouth once a week. 07/02/17  Yes Oval Linsey, MD  ALPRAZolam Duanne Moron) 1 MG tablet Take 1 tablet (1  mg total) by mouth 3 (three) times daily as needed for anxiety. 04/20/17  Yes Oval Linsey, MD  amLODipine (NORVASC) 10 MG tablet Take 1 tablet (10 mg total) by mouth daily. 05/18/17  Yes Oval Linsey, MD  aspirin 81 MG EC tablet Take 81 mg by mouth daily.     Yes [provider]  atenolol (TENORMIN) 100 MG tablet Take 1 tablet (100 mg total) by mouth daily. 03/16/17  Yes Oval Linsey, MD  atorvastatin (LIPITOR) 10 MG tablet TAKE 1 TABLET BY MOUTH EVERY DAY (STOP LOVASTATIN) 03/29/17  Yes Tommy Medal, Lavell Islam, MD  baclofen (LIORESAL) 10 MG tablet Take 10 mg by mouth 3 (three) times daily as needed. for muscle spams 08/03/17  Yes [provider]  benazepril (LOTENSIN) 40 MG tablet TAKE ONE TABLET BY MOUTH DAILY 07/19/17  Yes Annia Belt, MD  bictegravir-emtricitabine-tenofovir AF (BIKTARVY) 50-200-25 MG TABS tablet Take 1 tablet by mouth daily. 03/19/17  Yes Tommy Medal, Lavell Islam, MD  calcium-vitamin D (OSCAL WITH D) 500-200 MG-UNIT per tablet Take 1 tablet by mouth 2 (two) times daily.   Yes [provider]  COMBIGAN 0.2-0.5 % ophthalmic solution Place 1 drop into the left eye 2 (two) times daily. 01/06/13  Yes [provider]  diclofenac sodium (VOLTAREN) 1 % GEL Apply  2 g topically 4 (four) times daily. 08/09/17  Yes Oval Linsey, MD  docusate sodium (COLACE) 100 MG capsule Take 100 mg by mouth 2 (two) times daily.   Yes [provider]  dorzolamide (TRUSOPT) 2 % ophthalmic solution Place 1 drop into both eyes 2 (two) times daily. 08/03/17  Yes [provider]  fentaNYL (DURAGESIC - DOSED MCG/HR) 12 MCG/HR Place 1 patch (12.5 mcg total) onto the skin every 3 (three) days. 10/16/17  Yes Oval Linsey, MD  HYDROcodone-acetaminophen (NORCO) 10-325 MG tablet Take 1 tablet by mouth every 8 (eight) hours as needed for severe pain. 10/16/17  Yes Oval Linsey, MD  ibuprofen (ADVIL,MOTRIN) 200 MG tablet Take 400 mg by mouth 3 (three) times daily as needed for pain.   Yes [provider]  latanoprost (XALATAN) 0.005 % ophthalmic solution Place 1 drop into both eyes at bedtime. 08/16/17  Yes [provider]  Multiple Vitamin (MULTIVITAMIN WITH MINERALS) TABS Take 1 tablet by mouth daily.   Yes [provider]  Naloxone HCl (EVZIO) 0.4 MG/0.4ML SOAJ Inject 0.4 mg as directed once as needed. For overdose and dial 911. May give another dose if needed. 04/01/15  Yes Axel Filler, MD  pantoprazole (PROTONIX) 40 MG tablet TAKE ONE TABLET BY MOUTH EVERY DAY 03/29/17  Yes Tommy Medal, Lavell Islam, MD  spironolactone (ALDACTONE) 100 MG tablet Take 1 tablet (100 mg total) by mouth daily. 08/25/16  Yes Oval Linsey, MD  terazosin (HYTRIN) 5 MG capsule Take 1 capsule (5 mg total) by mouth at bedtime. 08/09/17  Yes Oval Linsey, MD    Scheduled Meds: . amLODipine  10 mg Oral Daily  . aspirin  81 mg Oral Daily  . atenolol  100 mg Oral Daily  . benazepril  40 mg Oral Daily  . bictegravir-emtricitabine-tenofovir AF  1 tablet Oral Daily  . brimonidine  1 drop Left Eye BID   And  . timolol  1 drop Left Eye BID  . docusate sodium  100 mg Oral BID  . dorzolamide  1 drop Both Eyes BID  . enoxaparin (LOVENOX) injection  40 mg  Subcutaneous Q24H  . latanoprost  1  drop Both Eyes QHS  . pantoprazole  40 mg Oral Daily   Continuous Infusions: . 0.9 % NaCl with KCl 20 mEq / L 50 mL/hr at 08/20/17 0600  . cefTRIAXone (ROCEPHIN)  IV 1 g (08/19/17 1639)   PRN Meds:.acetaminophen **OR** acetaminophen, haloperidol lactate, hydrALAZINE, LORazepam, morphine injection, ondansetron **OR** ondansetron (ZOFRAN) IV     Results for orders placed or performed during the hospital encounter of 08/17/17 (from the past 48 hour(s))  Comprehensive metabolic panel     Status: Abnormal   Collection Time: 08/19/17  8:27 AM  Result Value Ref Range   Sodium 139 135 - 145 mmol/L   Potassium 3.0 (L) 3.5 - 5.1 mmol/L   Chloride 107 101 - 111 mmol/L   CO2 21 (L) 22 - 32 mmol/L   Glucose, Bld 122 (H) 65 - 99 mg/dL   BUN 6 6 - 20 mg/dL   Creatinine, Ser 0.56 0.44 - 1.00 mg/dL   Calcium 9.7 8.9 - 10.3 mg/dL   Total Protein 8.0 6.5 - 8.1 g/dL   Albumin 3.8 3.5 - 5.0 g/dL   AST 116 (H) 15 - 41 U/L   ALT 63 (H) 14 - 54 U/L   Alkaline Phosphatase 52 38 - 126 U/L   Total Bilirubin 1.4 (H) 0.3 - 1.2 mg/dL   GFR calc non Af Amer >60 >60 mL/min   GFR calc Af Amer >60 >60 mL/min    Comment: (NOTE) The eGFR has been calculated using the CKD EPI equation. This calculation has not been validated in all clinical situations. eGFR's persistently <60 mL/min signify possible Chronic Kidney Disease.    Anion gap 11 5 - 15    Comment: Performed at [facility], [address], [place], [place] [address]  Magnesium     Status: None   Collection Time: 08/19/17  8:27 AM  Result Value Ref Range   Magnesium 1.9 1.7 - 2.4 mg/dL    Comment: Performed at [facility], [address], [place], [place] [address]  CBC with Differential/Platelet     Status: None   Collection Time: 08/19/17  8:27 AM  Result Value Ref Range   WBC 9.0 4.0 - 10.5 K/uL   RBC 4.49 3.87 - 5.11 MIL/uL   Hemoglobin 14.4 12.0 - 15.0 g/dL   HCT 42.1 36.0 - 46.0 %   MCV 93.8 78.0 -  100.0 fL   MCH 32.1 26.0 - 34.0 pg   MCHC 34.2 30.0 - 36.0 g/dL   RDW 13.6 11.5 - 15.5 %   Platelets 288 150 - 400 K/uL   Neutrophils Relative % 68 %   Neutro Abs 6.2 1.7 - 7.7 K/uL   Lymphocytes Relative 26 %   Lymphs Abs 2.3 0.7 - 4.0 K/uL   Monocytes Relative 6 %   Monocytes Absolute 0.5 0.1 - 1.0 K/uL   Eosinophils Relative 0 %   Eosinophils Absolute 0.0 0.0 - 0.7 K/uL   Basophils Relative 0 %   Basophils Absolute 0.0 0.0 - 0.1 K/uL    Comment: Performed at [facility], [address], [place], [place] [address]  TSH     Status: None   Collection Time: 08/19/17 11:20 AM  Result Value Ref Range   TSH 3.593 0.350 - 4.500 uIU/mL    Comment: Performed by a 3rd Generation assay with a functional sensitivity of <=0.01 uIU/mL. Performed at [facility], [address] [place], [place] [address]   Comprehensive metabolic panel     Status: Abnormal  Collection Time: 08/20/17  5:00 AM  Result Value Ref Range   Sodium 142 135 - 145 mmol/L   Potassium 3.5 3.5 - 5.1 mmol/L   Chloride 112 (H) 101 - 111 mmol/L   CO2 20 (L) 22 - 32 mmol/L   Glucose, Bld 121 (H) 65 - 99 mg/dL   BUN 8 6 - 20 mg/dL   Creatinine, Ser 0.53 0.44 - 1.00 mg/dL   Calcium 9.5 8.9 - 10.3 mg/dL   Total Protein 7.5 6.5 - 8.1 g/dL   Albumin 3.7 3.5 - 5.0 g/dL   AST 94 (H) 15 - 41 U/L   ALT 58 (H) 14 - 54 U/L   Alkaline Phosphatase 51 38 - 126 U/L   Total Bilirubin 1.1 0.3 - 1.2 mg/dL   GFR calc non Af Amer >60 >60 mL/min   GFR calc Af Amer >60 >60 mL/min    Comment: (NOTE) The eGFR has been calculated using the CKD EPI equation. This calculation has not been validated in all clinical situations. eGFR's persistently <60 mL/min signify possible Chronic Kidney Disease.    Anion gap 10 5 - 15    Comment: Performed at [facility], [address], [place], [place] [address]  Magnesium     Status: None   Collection Time: 08/20/17  5:00 AM  Result Value Ref Range   Magnesium 1.8 1.7 - 2.4 mg/dL    Comment:  Performed at [facility], [address], [place], [place] [address]  CBC WITH DIFFERENTIAL     Status: None   Collection Time: 08/20/17  5:00 AM  Result Value Ref Range   WBC 8.5 4.0 - 10.5 K/uL   RBC 4.46 3.87 - 5.11 MIL/uL   Hemoglobin 14.2 12.0 - 15.0 g/dL   HCT 42.1 36.0 - 46.0 %   MCV 94.4 78.0 - 100.0 fL   MCH 31.8 26.0 - 34.0 pg   MCHC 33.7 30.0 - 36.0 g/dL   RDW 13.7 11.5 - 15.5 %   Platelets 302 150 - 400 K/uL   Neutrophils Relative % 51 %   Neutro Abs 4.4 1.7 - 7.7 K/uL   Lymphocytes Relative 41 %   Lymphs Abs 3.5 0.7 - 4.0 K/uL   Monocytes Relative 7 %   Monocytes Absolute 0.6 0.1 - 1.0 K/uL   Eosinophils Relative 0 %   Eosinophils Absolute 0.0 0.0 - 0.7 K/uL   Basophils Relative 1 %   Basophils Absolute 0.1 0.0 - 0.1 K/uL    Comment: Performed at [facility], [address] [place], [place] [address]    Studies/Results:   BRAIN MRI FINDINGS: Brain: No acute infarction, hemorrhage, hydrocephalus, extra-axial collection or mass lesion. Small remote left cerebellar infarct. Age normal brain volume.  Vascular: Major flow voids are preserved  Skull and upper cervical spine: No evidence of marrow lesion  Sinuses/Orbits: Negative  Other: For base of motion degradation, reportedly best obtainable.  IMPRESSION: 1. No acute finding. 2. Small remote left cerebellar infarct.  Otherwise age normal exam. 3. Motion degraded.      The brain MRI scan is reviewed impression.  No acute findings are seen on DWI.  SWI shows no hemorrhage.  There is mild periventricular leukoencephalopathy indicating mild microvascular is chronic ischemic changes.  There is subtle encephalomalacia involving the inferior aspect of the left cerebellar indicating a remote cerebellar lacunar infarct.    Glennda Weatherholtz A. Merlene [name], M.D.  Diplomate, Tax adviser of Psychiatry and Neurology ( Neurology). 08/20/2017, 8:49 AM

## 2017-08-20 NOTE — Evaluation (Signed)
Clinical/Bedside Swallow Evaluation Patient Details  Name: Jessica [name] MRN: [id number] Date of Birth: May 06, 1940  Today's Date: 08/20/2017 Time: SLP Start Time (ACUTE ONLY): 43 SLP Stop Time (ACUTE ONLY): 1056 SLP Time Calculation (min) (ACUTE ONLY): 21 min  Past Medical History:  Past Medical History:  Diagnosis Date  . Acute renal failure (ARF) ([place]) 08/17/2017  . Anxiety 04/05/2012  . Bilateral sensorineural hearing loss 06/11/2014   Mild to moderate on the left side and slight to mild on the right side per audiometry 05/2014.  Hearing aides with possible masking of tinnitus recommended but patient wished to defer secondary to finances.  . Blood transfusion without reported diagnosis    pt denies  . Bursitis of right shoulder 07/12/2012   s/p shoulder injection 07/12/2012   . Cataract of right eye   . Constipation due to pain medication 04/27/2010  . Diverticulosis 02/08/2012   Extensive left-sided diverticula on colonoscopy March 2012 per Dr. Gala Romney   . Essential hypertension 07/20/2006  . Genital herpes 07/20/2006  . 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 Heart murmur 1961  . Human immunodeficiency virus disease ([place]) 03/27/1986  . Hyperlipidemia LDL goal < 100 04/05/2012  . Long-term current use of opiate analgesic 03/17/2016  . Lumbar degenerative disc disease 07/20/2006   With chronic back pain   . Marijuana use 07/03/2016  . Micturition syncope 09/20/2015  . 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[place]) 11/01/2011   s/p aortobifem bypass 2009   . Periumbilical hernia 0/05/5007   1 cm left periumbilical abdominal wall defect  . 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 Right rotator cuff tear 02/01/2013   Responds to periodic steroid injections  . Seborrhea 09/01/2010  . Small bowel obstruction due to adhesions ([place]) 02/08/2012   s/p Exploratory laparotomy, lysis of adhesions 02/12/12    . Subjective tinnitus of both ears 05/18/2014   . Tobacco abuse 02/19/2012  . Tobacco abuse   . Vasovagal syncope 02/15/2015  . Voiding dysfunction    s/p cystoscopy and meatal dilation Dec 2005   Past Surgical History:  Past Surgical History:  Procedure Laterality Date  . ABDOMINAL HYSTERECTOMY    . AORTO-FEMORAL BYPASS GRAFT  04/2007  . APPENDECTOMY    . BREAST SURGERY     Breast biopsy: negative  . CHOLECYSTECTOMY    . COLECTOMY  01/2011   Dr. Margot Chimes; "took out 12 inches of small intestiines and removed blockage"  . EYE SURGERY    . LAPAROTOMY  02/12/2012   Procedure: EXPLORATORY LAPAROTOMY;  Surgeon: Stark Klein, MD;  Location: [facility] OR;  Service: General;  Laterality: N/A;  Exploratory Laparotomy, lysis of adhesions  . SMALL INTESTINE SURGERY     HPI:  Jessica [name] is a 77 y.o. female with an extensive past medical history including chronic pain, opioid dependence, benzodiazepine dependence, chronic constipation, HIV disease, PVD, glaucoma and other comorbidities presented to the emergency department by family members with altered mental status for the past 2 days increasing in lethargy and confusion.   The patient has had combative behaviors. The patient was noted to have a UTI.  Pt also was positive for THC.  Pt admitted to SDU.   Assessment / Plan / Recommendation Clinical Impression  Clinical swallow evaluation completed at bedside with RN present. Oral motor examination is WNL. Pt shows no overt signs or symptoms of aspiration with consistencies and textures presented. Pt also able to take  a handful of medications with straw sips of water without incident. Recommend regular textures and thin liquids. No further SLP follow up indicated at this time. Above to RN and MD.  SLP Visit Diagnosis: Dysphagia, unspecified (R13.10)    Aspiration Risk  No limitations    Diet Recommendation Regular;Thin liquid   Liquid Administration via: Cup;Straw Medication Administration: Whole meds with liquid Supervision: Patient able  to self feed Postural Changes: Seated upright at 90 degrees;Remain upright for at least 30 minutes after po intake    Other  Recommendations Oral Care Recommendations: Oral care BID;Patient independent with oral care Other Recommendations: Clarify dietary restrictions   Follow up Recommendations None      Frequency and Duration            Prognosis Prognosis for Safe Diet Advancement: Good      Swallow Study   General Date of Onset: 08/17/17 HPI: Jessica [name] is a 77 y.o. female with an extensive past medical history including chronic pain, opioid dependence, benzodiazepine dependence, chronic constipation, HIV disease, PVD, glaucoma and other comorbidities presented to the emergency department by family members with altered mental status for the past 2 days increasing in lethargy and confusion.   The patient has had combative behaviors. The patient was noted to have a UTI.  Pt also was positive for THC.  Pt admitted to SDU. Type of Study: Bedside Swallow Evaluation Previous Swallow Assessment: None on record Diet Prior to this Study: (clear liquids) Temperature Spikes Noted: No Respiratory Status: Room air History of Recent Intubation: No Behavior/Cognition: Alert;Cooperative;Pleasant mood Oral Cavity Assessment: Within Functional Limits Oral Care Completed by SLP: Yes Oral Cavity - Dentition: Adequate natural dentition Vision: Functional for self-feeding Self-Feeding Abilities: Able to feed self Patient Positioning: Upright in bed Baseline Vocal Quality: Normal Volitional Cough: Strong Volitional Swallow: Able to elicit    Oral/Motor/Sensory Function Overall Oral Motor/Sensory Function: Within functional limits   Ice Chips Ice chips: Within functional limits Presentation: Spoon   Thin Liquid Thin Liquid: Within functional limits Presentation: Cup;Self Fed;Straw    Nectar Thick Nectar Thick Liquid: Not tested   Honey Thick Honey Thick Liquid: Not tested    Puree Puree: Within functional limits Presentation: Spoon   Solid   Thank you,  Genene Churn, CCC-SLP [phone]    Solid: Within functional limits Presentation: Self Fed        Jessica [name] 08/20/2017,10:58 AM

## 2017-08-20 NOTE — Progress Notes (Signed)
EEG completed; results pending.    

## 2017-08-20 NOTE — Care Management Important Message (Signed)
Important Message  Patient Details  Name: Marche Hottenstein MRN: [id number] Date of Birth: [birth year]   Medicare Important Message Given:  Yes    Shelda Altes 08/20/2017, 11:21 AM

## 2017-08-20 NOTE — Progress Notes (Signed)
Pt is refusing to keep gown and monitor on. Keeps trying to get out of bed and yelling out.  AO4. This nurse with the help of other staff has repeatively responded to the pt and attempted to dress and reattach leads. Pt keeps saying that she needs to void. pt has foley intact. Pt is still pulling at leads and taking off clothes. Bed alarm is on. VSS.

## 2017-08-20 NOTE — Care Management Note (Signed)
Case Management Note  Patient Details  Name: Zhana Jeangilles MRN: [id number] Date of Birth: 1940/11/27  Subjective/Objective:   Toxic encephalopathy. Patient confused. Chart reviewed. From home, independent. Has PCP and insurance with prescription coverage.               Action/Plan: Anticipate DC home with care of family/self care.   Expected Discharge Date:   08/22/2017               Expected Discharge Plan:     In-House Referral:     Discharge planning Services  CM Consult  Post Acute Care Choice:    Choice offered to:     DME Arranged:    DME Agency:     HH Arranged:    HH Agency:     Status of Service:  In process, will continue to follow  If discussed at Long Length of Stay Meetings, dates discussed:    Additional Comments:  Salvadore Valvano, Chauncey Reading, RN 08/20/2017, 2:21 PM

## 2017-08-20 NOTE — Progress Notes (Signed)
PROGRESS NOTE    Jessica [name]  NIO:[id number]  DOB: 1940/06/27  DOA: 08/17/2017 PCP: Oval Linsey, MD   Brief Admission Hx: Jessica [name] is a 77 y.o. female with an extensive past medical history including chronic pain, opioid dependence, benzodiazepine dependence, chronic constipation, HIV disease, PVD, glaucoma and other comorbidities presented to the emergency department by family members with altered mental status for the past 2 days increasing in lethargy and confusion.   The patient has had combative behaviors. The patient was noted to have a UTI.  Pt also was positive for THC.  Pt admitted to SDU.    MDM/Assessment & Plan:   1. Acute toxic encephalopathy-unknown primary cause, however UDS was positive for THC, it could have been laced with an unknown substance, pt slowly improving daily.  The patient admitted to the stepdown unit so that she could be monitored closely. Continue supportive care with IV fluids.  Aspiration precautions.    EEG has been done, with report pending.  MRI brain without contrast negative for acute findings.  Neurology following.  The patient is afebrile with a supple neck and normal white blood cell count therefore low suspicion for meningitis at this time.  Continue to monitor closely.  Follow blood cultures.  Ammonia level slightly elevated.  Urinalysis positive for infection, treating with antibiotics.   2. UTI -urine culture positive for E. coli as well as VRE.  She has been receiving ceftriaxone up until now.  Will change to Macrodantin based on sensitivities. 3. Hypokalemia -improved with replacement.  Magnesium normal. 4. Sinus bradycardia-slightly improved, TSH in normal range, family is not aware of any past history of this level of bradycardia.  We will continue to monitor on telemetry closely.    Appears to be improving.  5. Chronic pain-patient is dependent on opioids, interestingly, fentanyl patch has been off last couple of days  and no complaints of pain, IV morphine ordered as needed.   6. Benzodiazepine dependence- IV medications ordered as needed.  Follow and monitor closely, hold for sedation. 7. Acute renal failure-Resolved now after treating with IV fluid hydration. 8. Hypernatremia-Resolved. Secondary to dehydration, treated with hypotonic fluids.  9. HIV disease -continue home antiretrovirals 10. Chronic constipation secondary to chronic opioid use- start on daily MiraLAX.  DVT Prophylaxis: Lovenox Code Status: Full Family Communication:  No family present Disposition Plan:  Transfer to MedSurg bed.  Subjective: Appears to be more awake today.  Wants to go home.  No nausea or vomiting.  Ready to eat solid food.  Objective: Vitals:   08/20/17 0434 08/20/17 0500 08/20/17 0600 08/20/17 0605  BP:  (!) 161/63 (!) 191/65 (!) 181/71  Pulse:  70 71   Resp:  11 15   Temp: (!) 97.3 F (36.3 C)     TempSrc: Oral     SpO2:  98% 97%   Weight:      Height:        Intake/Output Summary (Last 24 hours) at 08/20/2017 1211 Last data filed at 08/20/2017 0600 Gross per 24 hour  Intake 1917.5 ml  Output 950 ml  Net 967.5 ml   Filed Weights   08/17/17 1015 08/18/17 0400  Weight: 59.4 kg (131 lb) 55.1 kg (121 lb 7.6 oz)   REVIEW OF SYSTEMS  As per history otherwise all reviewed and reported negative  Exam:  General exam: Alert, awake, oriented x 3 Respiratory system: Clear to auscultation. Respiratory effort normal. Cardiovascular system:RRR. No murmurs, rubs, gallops. Gastrointestinal  system: Abdomen is nondistended, soft and nontender. No organomegaly or masses felt. Normal bowel sounds heard. Central nervous system: Alert and oriented. No focal neurological deficits. Extremities: No C/C/E, +pedal pulses Skin: No rashes, lesions or ulcers Psychiatry: Continues to have some mild confusion.  She is alert and oriented x3, but continues to have some visual hallucinations.   Data Reviewed: Basic  Metabolic Panel: Recent Labs  Lab 08/17/17 1119 08/18/17 0502 08/19/17 0827 08/20/17 0500  NA 146* 144 139 142  K 3.3* 3.0* 3.0* 3.5  CL 111 109 107 112*  CO2 22 25 21* 20*  GLUCOSE 113* 150* 122* 121*  BUN 25* 13 6 8   CREATININE 1.56* 0.64 0.56 0.53  CALCIUM 9.7 9.9 9.7 9.5  MG  --  1.8 1.9 1.8   Liver Function Tests: Recent Labs  Lab 08/17/17 1119 08/18/17 0502 08/19/17 0827 08/20/17 0500  AST 148* 155* 116* 94*  ALT 58* 71* 63* 58*  ALKPHOS 55 56 52 51  BILITOT 1.2 0.8 1.4* 1.1  PROT 8.1 8.5* 8.0 7.5  ALBUMIN 4.0 4.1 3.8 3.7   No results for input(s): LIPASE, AMYLASE in the last 168 hours. Recent Labs  Lab 08/17/17 1329 08/18/17 0502  AMMONIA 34 40*   CBC: Recent Labs  Lab 08/17/17 1119 08/18/17 0502 08/19/17 0827 08/20/17 0500  WBC 10.4 9.7 9.0 8.5  NEUTROABS  --  5.5 6.2 4.4  HGB 13.7 15.5* 14.4 14.2  HCT 41.8 44.8 42.1 42.1  MCV 97.2 95.9 93.8 94.4  PLT 224 276 288 302   Cardiac Enzymes: Recent Labs  Lab 08/17/17 1719 08/17/17 2158 08/18/17 0502  TROPONINI 0.03* 0.03* <0.03   CBG (last 3)  Recent Labs    08/17/17 2210  GLUCAP 181*   Recent Results (from the past 240 hour(s))  Urine culture     Status: Abnormal   Collection Time: 08/17/17  4:00 PM  Result Value Ref Range Status   Specimen Description   Final    URINE, CLEAN CATCH Performed at [facility], [address] [place], [place] [address]    Special Requests   Final    NONE Performed at [facility], [address] [place], [place] [address]    Culture (A)  Final    >=100,000 COLONIES/mL ESCHERICHIA COLI >=100,000 COLONIES/mL VANCOMYCIN RESISTANT ENTEROCOCCUS    Report Status 08/20/2017 FINAL  Final   Organism ID, Bacteria ESCHERICHIA COLI (A)  Final   Organism ID, Bacteria VANCOMYCIN RESISTANT ENTEROCOCCUS (A)  Final      Susceptibility   Escherichia coli - MIC*    AMPICILLIN >=32 RESISTANT Resistant     CEFAZOLIN <=4 SENSITIVE Sensitive     CEFTRIAXONE <=1  SENSITIVE Sensitive     CIPROFLOXACIN >=4 RESISTANT Resistant     GENTAMICIN <=1 SENSITIVE Sensitive     IMIPENEM <=0.25 SENSITIVE Sensitive     NITROFURANTOIN <=16 SENSITIVE Sensitive     TRIMETH/SULFA <=20 SENSITIVE Sensitive     AMPICILLIN/SULBACTAM 16 INTERMEDIATE Intermediate     PIP/TAZO <=4 SENSITIVE Sensitive     Extended ESBL NEGATIVE Sensitive     * >=100,000 COLONIES/mL ESCHERICHIA COLI   Vancomycin resistant enterococcus - MIC*    AMPICILLIN 8 SENSITIVE Sensitive     LEVOFLOXACIN 0.5 SENSITIVE Sensitive     NITROFURANTOIN <=16 SENSITIVE Sensitive     VANCOMYCIN >=32 RESISTANT Resistant     * >=100,000 COLONIES/mL VANCOMYCIN RESISTANT ENTEROCOCCUS  MRSA PCR Screening     Status: None   Collection Time: 08/17/17  4:23 PM  Result Value Ref Range Status   MRSA by PCR NEGATIVE NEGATIVE Final    Comment:        The GeneXpert MRSA Assay (FDA approved for NASAL specimens only), is one component of a comprehensive MRSA colonization surveillance program. It is not intended to diagnose MRSA infection nor to guide or monitor treatment for MRSA infections. Performed at [facility], [address], [place], [place] [address]   Culture, blood (Routine X 2) w Reflex to ID Panel     Status: None (Preliminary result)   Collection Time: 08/17/17  5:17 PM  Result Value Ref Range Status   Specimen Description LEFT ANTECUBITAL  Final   Special Requests   Final    BOTTLES DRAWN AEROBIC AND ANAEROBIC Blood Culture adequate volume   Culture   Final    NO GROWTH 3 DAYS Performed at [facility], [address] [place], [place] [address]    Report Status PENDING  Incomplete  Culture, blood (Routine X 2) w Reflex to ID Panel     Status: None (Preliminary result)   Collection Time: 08/17/17  5:21 PM  Result Value Ref Range Status   Specimen Description BLOOD RIGHT ARM  Final   Special Requests   Final    BOTTLES DRAWN AEROBIC ONLY Blood Culture results may not be optimal due to an  inadequate volume of blood received in culture bottles   Culture   Final    NO GROWTH 3 DAYS Performed at [facility], [address] [place], [place] [address]    Report Status PENDING  Incomplete     Studies: No results found. Scheduled Meds: . amLODipine  10 mg Oral Daily  . aspirin  81 mg Oral Daily  . atenolol  100 mg Oral Daily  . benazepril  40 mg Oral Daily  . bictegravir-emtricitabine-tenofovir AF  1 tablet Oral Daily  . brimonidine  1 drop Left Eye BID   And  . timolol  1 drop Left Eye BID  . docusate sodium  100 mg Oral BID  . dorzolamide  1 drop Both Eyes BID  . enoxaparin (LOVENOX) injection  40 mg Subcutaneous Q24H  . latanoprost  1 drop Both Eyes QHS  . nitrofurantoin (macrocrystal-monohydrate)  100 mg Oral Q12H  . pantoprazole  40 mg Oral Daily   Continuous Infusions: . 0.9 % NaCl with KCl 20 mEq / L 50 mL/hr at 08/20/17 0600    Principal Problem:   Toxic encephalopathy Active Problems:   Human immunodeficiency virus disease (HCC)   Genital herpes   Glaucoma of left eye   Essential hypertension   Constipation due to pain medication   Tobacco abuse   Generalized anxiety disorder   UTI (urinary tract infection)   Bilateral sensorineural hearing loss   Long-term current use of opiate analgesic   Acute renal failure (ARF) (HCC)   Hypernatremia   Dehydration   Benzodiazepine dependence (HCC)   Sinus bradycardia  Time spent: 30 minutes  Kathie Dike, MD [facility] Pager [phone]  If 7PM-7AM, please contact night-coverage [email or web address] Password [email or web address] 08/20/2017, 12:11 PM    LOS: 3 days

## 2017-08-21 LAB — COMPREHENSIVE METABOLIC PANEL
ALT: 51 U/L (ref 14–54)
AST: 70 U/L — AB (ref 15–41)
Albumin: 3.7 g/dL (ref 3.5–5.0)
Alkaline Phosphatase: 47 U/L (ref 38–126)
Anion gap: 9 (ref 5–15)
BILIRUBIN TOTAL: 1.3 mg/dL — AB (ref 0.3–1.2)
BUN: 11 mg/dL (ref 6–20)
CALCIUM: 9.5 mg/dL (ref 8.9–10.3)
CHLORIDE: 114 mmol/L — AB (ref 101–111)
CO2: 19 mmol/L — ABNORMAL LOW (ref 22–32)
CREATININE: 0.55 mg/dL (ref 0.44–1.00)
Glucose, Bld: 103 mg/dL — ABNORMAL HIGH (ref 65–99)
Potassium: 3.5 mmol/L (ref 3.5–5.1)
Sodium: 142 mmol/L (ref 135–145)
TOTAL PROTEIN: 7.3 g/dL (ref 6.5–8.1)

## 2017-08-21 LAB — HOMOCYSTEINE: HOMOCYSTEINE-NORM: 13.4 umol/L (ref 0.0–15.0)

## 2017-08-21 NOTE — Evaluation (Signed)
Physical Therapy Evaluation Patient Details Name: Jessica [name] MRN: [id number] DOB: [birth year] Today's Date: 08/21/2017   History of Present Illness  Jessica [name] is a 77 y.o. female with an extensive past medical history including chronic pain, opioid dependence, benzodiazepine dependence, chronic constipation, HIV disease, PVD, glaucoma and other comorbidities detailed below presented to the emergency department by family members with complaints of altered mental status for the last 2 days increasing in lethargic and confusion.  The patient has had a poor oral intake for the past couple of days and had been refusing to take medications or any food in the last 24 hours.  The patient has also been more confused and agitated over the past several days.  The family reports that there have been no changes to her medications.  They deny that the patient had taken any recreational substances.  The patient apparently lives home alone but family members are close and check on her regularly.  They noted that she was her normal self 3 days ago.  The patient has basically been more agitated and anxious, confused and refusing oral intake.  The patient had been moving all extremities.  The family says that she has not had fever, no diarrhea, no rash or headache.  The patient had not been complaining of any illness.  The patient recently had a small change to her blood pressure medications otherwise there have been no other changes.  The patient does have a fentanyl patch for chronic pain and also takes oral opiates in addition to taking regular Xanax at least 3 times daily.  She has not been able to take her benzodiazepine in the last 24 hours because she had been refusing to take her medications.    Clinical Impression  Patient demonstrates slow labored movement for sitting up at bedside and during gait training with tendency to look down possibly due to kyphotic trunk, limited secondary to  fatigue and put back to bed after therapy.  Patient will benefit from continued physical therapy in hospital and recommended venue below to increase strength, balance, endurance for safe ADLs and gait.    Follow Up Recommendations Home health PT;Supervision for mobility/OOB    Equipment Recommendations  None recommended by PT    Recommendations for Other Services       Precautions / Restrictions Precautions Precautions: Fall Restrictions Weight Bearing Restrictions: No      Mobility  Bed Mobility Overal bed mobility: Needs Assistance Bed Mobility: Sit to Supine;Supine to Sit     Supine to sit: Mod assist Sit to supine: Mod assist   General bed mobility comments: slow labored movement  Transfers Overall transfer level: Needs assistance Equipment used: Rolling walker (2 wheeled) Transfers: Sit to/from Stand Sit to Stand: Min assist         General transfer comment: VC's to lean forward during sit to stands  Ambulation/Gait Ambulation/Gait assistance: Min assist Ambulation Distance (Feet): 35 Feet Assistive device: Rolling walker (2 wheeled) Gait Pattern/deviations: Decreased step length - right;Decreased step length - left;Decreased stride length Gait velocity: slow   General Gait Details: demonstrates slow labored cadence with difficulty making turns, no loss of balance, VC's to look forward, tends to look down possible due to kyphotic trunk  Stairs            Wheelchair Mobility    Modified Rankin (Stroke Patients Only)       Balance Overall balance assessment: Needs assistance Sitting-balance support: Feet supported;No upper extremity supported  Sitting balance-Leahy Scale: Fair     Standing balance support: Bilateral upper extremity supported;During functional activity Standing balance-Leahy Scale: Fair                               Pertinent Vitals/Pain Pain Assessment: 0-10 Pain Score: 5  Pain Location: chronic left sided low  back pain Pain Descriptors / Indicators: Aching;Discomfort Pain Intervention(s): Limited activity within patient's tolerance;Monitored during session    Home Living Family/patient expects to be discharged to:: Private residence Living Arrangements: Children Available Help at Discharge: Family Type of Home: House Home Access: Level entry     Home Layout: One level Home Equipment: Environmental consultant - 4 wheels;Bedside commode;Shower seat      Prior Function Level of Independence: Independent with assistive device(s)         Comments: household ambulator with Rollator     Hand Dominance        Extremity/Trunk Assessment   Upper Extremity Assessment Upper Extremity Assessment: Generalized weakness    Lower Extremity Assessment Lower Extremity Assessment: Generalized weakness    Cervical / Trunk Assessment Cervical / Trunk Assessment: Kyphotic  Communication   Communication: No difficulties  Cognition Arousal/Alertness: Awake/alert Behavior During Therapy: WFL for tasks assessed/performed Overall Cognitive Status: Within Functional Limits for tasks assessed                                        General Comments      Exercises     Assessment/Plan    PT Assessment Patient needs continued PT services  PT Problem List Decreased strength;Decreased activity tolerance;Decreased balance;Decreased mobility       PT Treatment Interventions Gait training;Stair training;Functional mobility training;Therapeutic activities;Therapeutic exercise;Patient/family education    PT Goals (Current goals can be found in the Care Plan section)  Acute Rehab PT Goals Patient Stated Goal: return home with daughter to assist PT Goal Formulation: With patient/family Time For Goal Achievement: 08/28/17 Potential to Achieve Goals: Good    Frequency Min 3X/week   Barriers to discharge        Co-evaluation               AM-PAC PT "6 Clicks" Daily Activity  Outcome  Measure Difficulty turning over in bed (including adjusting bedclothes, sheets and blankets)?: A Little Difficulty moving from lying on back to sitting on the side of the bed? : A Lot Difficulty sitting down on and standing up from a chair with arms (e.g., wheelchair, bedside commode, etc,.)?: A Lot Help needed moving to and from a bed to chair (including a wheelchair)?: A Little Help needed walking in hospital room?: A Little Help needed climbing 3-5 steps with a railing? : A Lot 6 Click Score: 15    End of Session   Activity Tolerance: Patient tolerated treatment well;Patient limited by fatigue Patient left: in bed;with call bell/phone within reach;with family/visitor present Nurse Communication: Mobility status PT Visit Diagnosis: Unsteadiness on feet (R26.81);Other abnormalities of gait and mobility (R26.89);Muscle weakness (generalized) (M62.81)    Time: 0960-4540 PT Time Calculation (min) (ACUTE ONLY): 29 min   Charges:   PT Evaluation $PT Eval Moderate Complexity: 1 Mod PT Treatments $Therapeutic Activity: 23-37 mins   PT G Codes:        4:08 PM, 12-Sep-2017 Lonell Grandchild, MPT Physical Therapist with [facility] [phone] office  [place] mobile phone

## 2017-08-21 NOTE — Procedures (Signed)
Harvey A. Merlene Laughter, MD     [email or web address]           HISTORY: This is a 77 year old female who presents with confusion and altered mental status.  The study is being done to evaluate for seizures as the etiology.  MEDICATIONS: Scheduled Meds: . amLODipine  10 mg Oral Daily  . aspirin  81 mg Oral Daily  . atenolol  100 mg Oral Daily  . benazepril  40 mg Oral Daily  . bictegravir-emtricitabine-tenofovir AF  1 tablet Oral Daily  . brimonidine  1 drop Left Eye BID   And  . timolol  1 drop Left Eye BID  . docusate sodium  100 mg Oral BID  . dorzolamide  1 drop Both Eyes BID  . enoxaparin (LOVENOX) injection  40 mg Subcutaneous Q24H  . latanoprost  1 drop Both Eyes QHS  . nitrofurantoin (macrocrystal-monohydrate)  100 mg Oral Q12H  . pantoprazole  40 mg Oral Daily  . polyethylene glycol  17 g Oral Daily   Continuous Infusions: . 0.9 % NaCl with KCl 20 mEq / L 50 mL/hr at 08/21/17 0916   PRN Meds:.acetaminophen **OR** acetaminophen, haloperidol lactate, hydrALAZINE, LORazepam, morphine injection, ondansetron **OR** ondansetron (ZOFRAN) IV  Prior to Admission medications   Medication Sig Start Date End Date Taking? Authorizing Provider  alendronate (FOSAMAX) 70 MG tablet Take 1 tablet (70 mg total) by mouth once a week. 07/02/17  Yes Oval Linsey, MD  ALPRAZolam Duanne Moron) 1 MG tablet Take 1 tablet (1 mg total) by mouth 3 (three) times daily as needed for anxiety. 04/20/17  Yes Oval Linsey, MD  amLODipine (NORVASC) 10 MG tablet Take 1 tablet (10 mg total) by mouth daily. 05/18/17  Yes Oval Linsey, MD  aspirin 81 MG EC tablet Take 81 mg by mouth daily.     Yes [provider]  atenolol (TENORMIN) 100 MG tablet Take 1 tablet (100 mg total) by mouth daily. 03/16/17  Yes Oval Linsey, MD  atorvastatin (LIPITOR) 10 MG tablet TAKE 1 TABLET BY MOUTH EVERY DAY (STOP LOVASTATIN) 03/29/17  Yes Tommy Medal, Lavell Islam, MD  baclofen (LIORESAL) 10 MG tablet  Take 10 mg by mouth 3 (three) times daily as needed. for muscle spams 08/03/17  Yes [provider]  benazepril (LOTENSIN) 40 MG tablet TAKE ONE TABLET BY MOUTH DAILY 07/19/17  Yes Annia Belt, MD  bictegravir-emtricitabine-tenofovir AF (BIKTARVY) 50-200-25 MG TABS tablet Take 1 tablet by mouth daily. 03/19/17  Yes Tommy Medal, Lavell Islam, MD  calcium-vitamin D (OSCAL WITH D) 500-200 MG-UNIT per tablet Take 1 tablet by mouth 2 (two) times daily.   Yes [provider]  COMBIGAN 0.2-0.5 % ophthalmic solution Place 1 drop into the left eye 2 (two) times daily. 01/06/13  Yes [provider]  diclofenac sodium (VOLTAREN) 1 % GEL Apply 2 g topically 4 (four) times daily. 08/09/17  Yes Oval Linsey, MD  docusate sodium (COLACE) 100 MG capsule Take 100 mg by mouth 2 (two) times daily.   Yes [provider]  dorzolamide (TRUSOPT) 2 % ophthalmic solution Place 1 drop into both eyes 2 (two) times daily. 08/03/17  Yes [provider]  fentaNYL (DURAGESIC - DOSED MCG/HR) 12 MCG/HR Place 1 patch (12.5 mcg total) onto the skin every 3 (three) days. 10/16/17  Yes Oval Linsey, MD  HYDROcodone-acetaminophen (NORCO) 10-325 MG tablet Take 1 tablet by mouth every 8 (eight) hours as needed for severe pain. 10/16/17  Yes Oval Linsey, MD  ibuprofen (ADVIL,MOTRIN) 200 MG tablet Take 400 mg by mouth 3 (three) times daily as needed for pain.   Yes [provider]  latanoprost (XALATAN) 0.005 % ophthalmic solution Place 1 drop into both eyes at bedtime. 08/16/17  Yes [provider]  Multiple Vitamin (MULTIVITAMIN WITH MINERALS) TABS Take 1 tablet by mouth daily.   Yes [provider]  Naloxone HCl (EVZIO) 0.4 MG/0.4ML SOAJ Inject 0.4 mg as directed once as needed. For overdose and dial 911. May give another dose if needed. 04/01/15  Yes Axel Filler, MD  pantoprazole (PROTONIX) 40 MG tablet TAKE ONE TABLET BY MOUTH EVERY DAY 03/29/17  Yes Tommy Medal, Lavell Islam, MD  spironolactone (ALDACTONE) 100 MG tablet Take 1 tablet (100 mg total) by mouth daily. 08/25/16  Yes Oval Linsey, MD  terazosin (HYTRIN) 5 MG capsule Take 1 capsule (5 mg total) by mouth at bedtime. 08/09/17  Yes Oval Linsey, MD      ANALYSIS: A 16 channel recording using standard 10 20 measurements is conducted for 21 minutes.  The background activity gets as high as 7.5-8 hertz which attenuates with eye-opening.  There is beta activity observed in frontal areas.  Awake and drowsy activities are observed.  Photic stimulation and hyperventilation are not conducted.  There is no focal or lateralized slowing.  There is a single sharp wave activity has phase reverses at T4.    IMPRESSION: 1.  This recording a slightly abnormal showing a single right temporal epileptiform activity.      Raul Winterhalter A. Merlene Laughter, M.D.  Diplomate, Tax adviser of Psychiatry and Neurology ( Neurology).

## 2017-08-21 NOTE — Plan of Care (Signed)
Pt had uneventful night; resting in bed with eyes closed. Will continue to monitor.

## 2017-08-21 NOTE — Progress Notes (Signed)
PROGRESS NOTE    Jessica [name]  NWG:[id number]  DOB: 04-May-1940  DOA: 08/17/2017 PCP: Oval Linsey, MD   Brief Admission Hx: Jessica [name] is a 77 y.o. female with an extensive past medical history including chronic pain, opioid dependence, benzodiazepine dependence, chronic constipation, HIV disease, PVD, glaucoma and other comorbidities presented to the emergency department by family members with altered mental status for the past 2 days increasing in lethargy and confusion.   The patient has had combative behaviors. The patient was noted to have a UTI.  Pt also was positive for THC.  Pt admitted to SDU.    MDM/Assessment & Plan:   1. Acute toxic encephalopathy-unknown primary cause, however UDS was positive for THC, it could have been laced with an unknown substance, pt slowly improving daily.  Continue supportive care with IV fluids.  Aspiration precautions.    EEG showed a single right temporal epileptiform activity.  Await further input from neurology.  MRI brain without contrast negative for acute findings. The patient is afebrile with a supple neck and normal white blood cell count therefore low suspicion for meningitis at this time.  Continue to monitor closely.  Follow blood cultures.  Ammonia level slightly elevated.  Urinalysis positive for infection, treating with antibiotics.   2. UTI -urine culture positive for E. coli as well as VRE.  She has been receiving ceftriaxone up until now.  Will change to Macrodantin based on sensitivities. 3. Hypokalemia -improved with replacement.  Magnesium normal. 4. Sinus bradycardia-slightly improved, TSH in normal range, family is not aware of any past history of this level of bradycardia.  We will continue to monitor on telemetry closely.    Appears to be improving.  5. Chronic pain-patient is dependent on opioids, interestingly, fentanyl patch has been off last couple of days and no complaints of pain, IV morphine ordered as  needed.   6. Benzodiazepine dependence- IV medications ordered as needed.  Follow and monitor closely, hold for sedation. 7. Acute renal failure-Resolved now after treating with IV fluid hydration. 8. Hypernatremia-Resolved. Secondary to dehydration, treated with hypotonic fluids.  9. HIV disease -continue home antiretrovirals 10. Chronic constipation secondary to chronic opioid use- started on daily MiraLAX.  DVT Prophylaxis: Lovenox Code Status: Full Family Communication:  No family present Disposition Plan:  Continue current treatments.  Likely discharge in the next 24 hours after physical therapy evaluation and neurology recommendations on EEG.  Subjective: She is feeling better today.  She is more coherent today.  Denies any shortness of breath or chest pain.  Objective: Vitals:   08/20/17 1930 08/21/17 0646 08/21/17 0716 08/21/17 1510  BP:  (!) 180/63 (!) 155/52 (!) 171/54  Pulse: 60 (!) 49 (!) 51 (!) 52  Resp: 14 15  18   Temp:  98 F (36.7 C)  98.1 F (36.7 C)  TempSrc:  Oral    SpO2:  100% 96% 98%  Weight:  56 kg (123 lb 7.3 oz)    Height:        Intake/Output Summary (Last 24 hours) at 08/21/2017 1759 Last data filed at 08/21/2017 5784 Gross per 24 hour  Intake 1025 ml  Output 400 ml  Net 625 ml   Filed Weights   08/17/17 1015 08/18/17 0400 08/21/17 0646  Weight: 59.4 kg (131 lb) 55.1 kg (121 lb 7.6 oz) 56 kg (123 lb 7.3 oz)   REVIEW OF SYSTEMS  As per history otherwise all reviewed and reported negative  Exam:  General  exam: Alert, awake, oriented x 3 Respiratory system: Clear to auscultation. Respiratory effort normal. Cardiovascular system:RRR. No murmurs, rubs, gallops. Gastrointestinal system: Abdomen is nondistended, soft and nontender. No organomegaly or masses felt. Normal bowel sounds heard. Central nervous system: Alert and oriented. No focal neurological deficits. Extremities: No C/C/E, +pedal pulses Skin: No rashes, lesions or  ulcers Psychiatry: Judgement and insight appear normal. Mood & affect appropriate.   Data Reviewed: Basic Metabolic Panel: Recent Labs  Lab 08/17/17 1119 08/18/17 0502 08/19/17 0827 08/20/17 0500 08/21/17 0426  NA 146* 144 139 142 142  K 3.3* 3.0* 3.0* 3.5 3.5  CL 111 109 107 112* 114*  CO2 22 25 21* 20* 19*  GLUCOSE 113* 150* 122* 121* 103*  BUN 25* 13 6 8 11   CREATININE 1.56* 0.64 0.56 0.53 0.55  CALCIUM 9.7 9.9 9.7 9.5 9.5  MG  --  1.8 1.9 1.8  --    Liver Function Tests: Recent Labs  Lab 08/17/17 1119 08/18/17 0502 08/19/17 0827 08/20/17 0500 08/21/17 0426  AST 148* 155* 116* 94* 70*  ALT 58* 71* 63* 58* 51  ALKPHOS 55 56 52 51 47  BILITOT 1.2 0.8 1.4* 1.1 1.3*  PROT 8.1 8.5* 8.0 7.5 7.3  ALBUMIN 4.0 4.1 3.8 3.7 3.7   No results for input(s): LIPASE, AMYLASE in the last 168 hours. Recent Labs  Lab 08/17/17 1329 08/18/17 0502  AMMONIA 34 40*   CBC: Recent Labs  Lab 08/17/17 1119 08/18/17 0502 08/19/17 0827 08/20/17 0500  WBC 10.4 9.7 9.0 8.5  NEUTROABS  --  5.5 6.2 4.4  HGB 13.7 15.5* 14.4 14.2  HCT 41.8 44.8 42.1 42.1  MCV 97.2 95.9 93.8 94.4  PLT 224 276 288 302   Cardiac Enzymes: Recent Labs  Lab 08/17/17 1719 08/17/17 2158 08/18/17 0502  TROPONINI 0.03* 0.03* <0.03   CBG (last 3)  No results for input(s): GLUCAP in the last 72 hours. Recent Results (from the past 240 hour(s))  Urine culture     Status: Abnormal   Collection Time: 08/17/17  4:00 PM  Result Value Ref Range Status   Specimen Description   Final    URINE, CLEAN CATCH Performed at [facility], [address], [place], [place] [address]    Special Requests   Final    NONE Performed at [facility], [address], [place], [place] [address]    Culture (A)  Final    >=100,000 COLONIES/mL ESCHERICHIA COLI >=100,000 COLONIES/mL VANCOMYCIN RESISTANT ENTEROCOCCUS    Report Status 08/20/2017 FINAL  Final   Organism ID, Bacteria ESCHERICHIA COLI (A)  Final   Organism ID,  Bacteria VANCOMYCIN RESISTANT ENTEROCOCCUS (A)  Final      Susceptibility   Escherichia coli - MIC*    AMPICILLIN >=32 RESISTANT Resistant     CEFAZOLIN <=4 SENSITIVE Sensitive     CEFTRIAXONE <=1 SENSITIVE Sensitive     CIPROFLOXACIN >=4 RESISTANT Resistant     GENTAMICIN <=1 SENSITIVE Sensitive     IMIPENEM <=0.25 SENSITIVE Sensitive     NITROFURANTOIN <=16 SENSITIVE Sensitive     TRIMETH/SULFA <=20 SENSITIVE Sensitive     AMPICILLIN/SULBACTAM 16 INTERMEDIATE Intermediate     PIP/TAZO <=4 SENSITIVE Sensitive     Extended ESBL NEGATIVE Sensitive     * >=100,000 COLONIES/mL ESCHERICHIA COLI   Vancomycin resistant enterococcus - MIC*    AMPICILLIN 8 SENSITIVE Sensitive     LEVOFLOXACIN 0.5 SENSITIVE Sensitive     NITROFURANTOIN <=16 SENSITIVE Sensitive  VANCOMYCIN >=32 RESISTANT Resistant     * >=100,000 COLONIES/mL VANCOMYCIN RESISTANT ENTEROCOCCUS  MRSA PCR Screening     Status: None   Collection Time: 08/17/17  4:23 PM  Result Value Ref Range Status   MRSA by PCR NEGATIVE NEGATIVE Final    Comment:        The GeneXpert MRSA Assay (FDA approved for NASAL specimens only), is one component of a comprehensive MRSA colonization surveillance program. It is not intended to diagnose MRSA infection nor to guide or monitor treatment for MRSA infections. Performed at [facility], [address], [place], [place] [address]   Culture, blood (Routine X 2) w Reflex to ID Panel     Status: None (Preliminary result)   Collection Time: 08/17/17  5:17 PM  Result Value Ref Range Status   Specimen Description LEFT ANTECUBITAL  Final   Special Requests   Final    BOTTLES DRAWN AEROBIC AND ANAEROBIC Blood Culture adequate volume   Culture   Final    NO GROWTH 4 DAYS Performed at [facility], [address], [place], [place] [address]    Report Status PENDING  Incomplete  Culture, blood (Routine X 2) w Reflex to ID Panel     Status: None (Preliminary result)   Collection Time: 08/17/17   5:21 PM  Result Value Ref Range Status   Specimen Description BLOOD RIGHT ARM  Final   Special Requests   Final    BOTTLES DRAWN AEROBIC ONLY Blood Culture results may not be optimal due to an inadequate volume of blood received in culture bottles   Culture   Final    NO GROWTH 4 DAYS Performed at [facility], [address] [place], [place] [address]    Report Status PENDING  Incomplete     Studies: No results found. Scheduled Meds: . amLODipine  10 mg Oral Daily  . aspirin  81 mg Oral Daily  . atenolol  100 mg Oral Daily  . benazepril  40 mg Oral Daily  . bictegravir-emtricitabine-tenofovir AF  1 tablet Oral Daily  . brimonidine  1 drop Left Eye BID   And  . timolol  1 drop Left Eye BID  . docusate sodium  100 mg Oral BID  . dorzolamide  1 drop Both Eyes BID  . enoxaparin (LOVENOX) injection  40 mg Subcutaneous Q24H  . latanoprost  1 drop Both Eyes QHS  . nitrofurantoin (macrocrystal-monohydrate)  100 mg Oral Q12H  . pantoprazole  40 mg Oral Daily  . polyethylene glycol  17 g Oral Daily   Continuous Infusions: . 0.9 % NaCl with KCl 20 mEq / L 50 mL/hr at 08/21/17 8242    Principal Problem:   Toxic encephalopathy Active Problems:   Human immunodeficiency virus disease (HCC)   Genital herpes   Glaucoma of left eye   Essential hypertension   Constipation due to pain medication   Tobacco abuse   Generalized anxiety disorder   UTI (urinary tract infection)   Bilateral sensorineural hearing loss   Long-term current use of opiate analgesic   Acute renal failure (ARF) (HCC)   Hypernatremia   Dehydration   Benzodiazepine dependence (HCC)   Sinus bradycardia  Time spent: 30 minutes  Kathie Dike, MD [facility] Pager [phone]  If 7PM-7AM, please contact night-coverage [email or web address] Password [email or web address] 08/21/2017, 5:59 PM    LOS: 4 days

## 2017-08-21 NOTE — Plan of Care (Signed)
  Problem: Acute Rehab PT Goals(only PT should resolve) Goal: Pt Will Go Supine/Side To Sit Outcome: Progressing Flowsheets (Taken 08/21/2017 1609) Pt will go Supine/Side to Sit: with min guard assist Goal: Patient Will Transfer Sit To/From Stand Outcome: Progressing Flowsheets (Taken 08/21/2017 1609) Patient will transfer sit to/from stand: with min guard assist Goal: Pt Will Transfer Bed To Chair/Chair To Bed Outcome: Progressing Flowsheets (Taken 08/21/2017 1609) Pt will Transfer Bed to Chair/Chair to Bed: min guard assist Goal: Pt Will Ambulate Outcome: Progressing Flowsheets (Taken 08/21/2017 1609) Pt will Ambulate: 50 feet;with min guard assist;with rolling walker   4:10 PM, 08/21/17 Lonell Grandchild, MPT Physical Therapist with [facility] [phone] office [phone] mobile phone

## 2017-08-22 LAB — COMPREHENSIVE METABOLIC PANEL
ALT: 46 U/L (ref 14–54)
AST: 58 U/L — ABNORMAL HIGH (ref 15–41)
Albumin: 3.8 g/dL (ref 3.5–5.0)
Alkaline Phosphatase: 50 U/L (ref 38–126)
Anion gap: 9 (ref 5–15)
BILIRUBIN TOTAL: 1.1 mg/dL (ref 0.3–1.2)
BUN: 9 mg/dL (ref 6–20)
CHLORIDE: 114 mmol/L — AB (ref 101–111)
CO2: 20 mmol/L — ABNORMAL LOW (ref 22–32)
CREATININE: 0.61 mg/dL (ref 0.44–1.00)
Calcium: 9.5 mg/dL (ref 8.9–10.3)
Glucose, Bld: 115 mg/dL — ABNORMAL HIGH (ref 65–99)
POTASSIUM: 3.7 mmol/L (ref 3.5–5.1)
Sodium: 143 mmol/L (ref 135–145)
TOTAL PROTEIN: 7.5 g/dL (ref 6.5–8.1)

## 2017-08-22 LAB — CULTURE, BLOOD (ROUTINE X 2)
Culture: NO GROWTH
Culture: NO GROWTH
Special Requests: ADEQUATE

## 2017-08-22 MED ORDER — ALPRAZOLAM 1 MG PO TABS: 0.5000 mg | ORAL_TABLET | Freq: Three times a day (TID) | ORAL | 3 refills | Status: DC | PRN

## 2017-08-22 MED ORDER — HYDRALAZINE HCL 25 MG PO TABS
25.0000 mg | ORAL_TABLET | Freq: Three times a day (TID) | ORAL | Status: DC
  Administered 2017-08-22: 25 mg via ORAL
  Filled 2017-08-22: qty 1

## 2017-08-22 MED ORDER — HYDRALAZINE HCL 25 MG PO TABS: 25.0000 mg | ORAL_TABLET | Freq: Three times a day (TID) | ORAL | 0 refills | Status: DC

## 2017-08-22 MED ORDER — NITROFURANTOIN MONOHYD MACRO 100 MG PO CAPS: 100.0000 mg | ORAL_CAPSULE | Freq: Two times a day (BID) | ORAL | 0 refills | Status: DC

## 2017-08-22 NOTE — Care Management Important Message (Signed)
Important Message  Patient Details  Name: Jessica [name] MRN: [id number] Date of Birth: 17-Jul-1940   Medicare Important Message Given:  Yes    Shelda Altes 08/22/2017, 11:55 AM

## 2017-08-22 NOTE — Progress Notes (Signed)
BP 182/62, hydralazine given as ordered at 0602. BP 167/59 within the hr. Will continue to monitor.

## 2017-08-22 NOTE — Progress Notes (Signed)
Physical Therapy Treatment Patient Details Name: Jo Booze MRN: [id number] DOB: 1940-04-18 Today's Date: 08/22/2017    History of Present Illness Helene Bernstein is a 77 y.o. female with an extensive past medical history including chronic pain, opioid dependence, benzodiazepine dependence, chronic constipation, HIV disease, PVD, glaucoma and other comorbidities detailed below presented to the emergency department by family members with complaints of altered mental status for the last 2 days increasing in lethargic and confusion.  The patient has had a poor oral intake for the past couple of days and had been refusing to take medications or any food in the last 24 hours.  The patient has also been more confused and agitated over the past several days.  The family reports that there have been no changes to her medications.  They deny that the patient had taken any recreational substances.  The patient apparently lives home alone but family members are close and check on her regularly.  They noted that she was her normal self 3 days ago.  The patient has basically been more agitated and anxious, confused and refusing oral intake.  The patient had been moving all extremities.  The family says that she has not had fever, no diarrhea, no rash or headache.  The patient had not been complaining of any illness.  The patient recently had a small change to her blood pressure medications otherwise there have been no other changes.  The patient does have a fentanyl patch for chronic pain and also takes oral opiates in addition to taking regular Xanax at least 3 times daily.  She has not been able to take her benzodiazepine in the last 24 hours because she had been refusing to take her medications.    PT Comments    Patient presents with legs hanging over side rail and appears slightly confused, but able to acknowledge person and place and states, "I know where I am at, but cannot sometimes put it  in her head."   Overall patient demonstrates improvement for sitting up and tolerated longer distance for ambulation in hallway without loss of balance, limited secondary to fatigue.  Nursing staff informed that patient may be slightly confused or baseline?  Plan:  Patient to discharge home to day and discharged from physical therapy to nursing staff with recommendations below.   Follow Up Recommendations  Home health PT;Supervision for mobility/OOB     Equipment Recommendations  None recommended by PT    Recommendations for Other Services       Precautions / Restrictions Precautions Precautions: Fall Restrictions Weight Bearing Restrictions: No    Mobility  Bed Mobility Overal bed mobility: Needs Assistance Bed Mobility: Supine to Sit;Sit to Supine     Supine to sit: Min assist Sit to supine: Min assist   General bed mobility comments: slow labored movement  Transfers Overall transfer level: Needs assistance Equipment used: Rolling walker (2 wheeled) Transfers: Sit to/from [facility] Sit to Stand: Min assist Stand pivot transfers: Min guard       General transfer comment: verbal cues to push from bed with 1 hand during sit to stands  Ambulation/Gait Ambulation/Gait assistance: Min guard Ambulation Distance (Feet): 45 Feet Assistive device: Rolling walker (2 wheeled) Gait Pattern/deviations: Decreased step length - right;Decreased step length - left;Decreased stride length Gait velocity: slow   General Gait Details: demonstrates increased endurance/distance for gait training with slow slightly labored cadence, VC's to not look down with fair/good carryover, limited secondary to fatigue  Stairs             Wheelchair Mobility    Modified Rankin (Stroke Patients Only)       Balance Overall balance assessment: Needs assistance Sitting-balance support: Feet supported;No upper extremity supported Sitting balance-Leahy Scale: Fair      Standing balance support: Bilateral upper extremity supported;During functional activity Standing balance-Leahy Scale: Fair                              Cognition Arousal/Alertness: Awake/alert Behavior During Therapy: WFL for tasks assessed/performed Overall Cognitive Status: Within Functional Limits for tasks assessed                                        Exercises General Exercises - Lower Extremity Ankle Circles/Pumps: Seated;AROM;Strengthening;Both;10 reps Long Arc Quad: Seated;AROM;Strengthening;Both;10 reps Hip Flexion/Marching: Seated;AROM;Strengthening;Both;10 reps    General Comments        Pertinent Vitals/Pain Pain Assessment: Faces Pain Score: 0-No pain    Home Living                      Prior Function            PT Goals (current goals can now be found in the care plan section) Acute Rehab PT Goals Patient Stated Goal: return home with daughter to assist PT Goal Formulation: With patient/family Time For Goal Achievement: 09-12-17 Potential to Achieve Goals: Good Progress towards PT goals: Progressing toward goals    Frequency    Min 3X/week      PT Plan Current plan remains appropriate    Co-evaluation              AM-PAC PT "6 Clicks" Daily Activity  Outcome Measure  Difficulty turning over in bed (including adjusting bedclothes, sheets and blankets)?: None Difficulty moving from lying on back to sitting on the side of the bed? : A Little Difficulty sitting down on and standing up from a chair with arms (e.g., wheelchair, bedside commode, etc,.)?: A Little Help needed moving to and from a bed to chair (including a wheelchair)?: A Little Help needed walking in hospital room?: A Little Help needed climbing 3-5 steps with a railing? : A Lot 6 Click Score: 18    End of Session Equipment Utilized During Treatment: Gait belt Activity Tolerance: Patient tolerated treatment well;Patient limited by  fatigue Patient left: in bed;with call bell/phone within reach;with bed alarm set Nurse Communication: Mobility status PT Visit Diagnosis: Unsteadiness on feet (R26.81);Other abnormalities of gait and mobility (R26.89);Muscle weakness (generalized) (M62.81)     Time: 1040-1106 PT Time Calculation (min) (ACUTE ONLY): 26 min  Charges:  $Therapeutic Exercise: 8-22 mins $Therapeutic Activity: 8-22 mins                    G Codes:       2:11 PM, 2017/09/12 Lonell Grandchild, MPT Physical Therapist with [facility] 336 [phone] office [phone] mobile phone

## 2017-08-22 NOTE — Discharge Summary (Signed)
Physician Discharge Summary  [name] URK:[id number] DOB: 04/24/1940 DOA: 08/17/2017  PCP: Oval Linsey, MD  Admit date: 08/17/2017 Discharge date: 08/22/2017  Admitted From: home Disposition:  home  Recommendations for Outpatient Follow-up:  1. Follow up with PCP in 1-2 weeks 2. Please obtain BMP/CBC in one week 3. Please follow up on the following pending results:  Home Health:home health PT Equipment/Devices:  Discharge Condition:stable CODE STATUS:full code Diet recommendation: heart healthy  Brief/Interim Summary: [name] Mannsis a 77 y.o.femalewith an extensive past medical history including chronic pain, opioid dependence, benzodiazepine dependence, chronic constipation, HIV disease, PVD, glaucoma and other comorbidities presented to the emergency department by family members with altered mental status for the past 2 days increasing in lethargy and confusion.   The patient has had combative behaviors. The patient was noted to have a UTI.  Pt also was positive for THC.  Pt admitted to SDU    Discharge Diagnoses:  Principal Problem:   Toxic encephalopathy Active Problems:   Human immunodeficiency virus disease ([place])   Genital herpes   Glaucoma of left eye   Essential hypertension   Constipation due to pain medication   Tobacco abuse   Generalized anxiety disorder   UTI (urinary tract infection)   Bilateral sensorineural hearing loss   Long-term current use of opiate analgesic   Acute renal failure (ARF) (HCC)   Hypernatremia   Dehydration   Benzodiazepine dependence (HCC)   Sinus bradycardia  1. Acute toxic encephalopathy-unknown primary cause, however UDS was positive for THC, it could have been laced with an unknown substance. Patient was also found to have UTI, which may have also been contributing. She was treated with supportive care with IV fluids. Aspiration precautions.   EEG showed a single right temporal epileptiform activity.   Discussed with Neurology and antiepileptics were not recommended since she had other causes for alteration in mental status and she was clinically improving. MRI brain without contrast negative for acute findings. The patient is afebrile with a supple neck and normal white blood cell count therefore low suspicion for meningitis at this time.  Blood cultures showed no growth. Ammonia level slightly elevated, but did not appear to be clinically signfiicant. Since admission, she has significantly improved and appears to be approaching baseline. She feels ready for discharge home   2. UTI -urine culture positive for E. coli as well as VRE.  She was initially treated with ceftriaxone, but after culture sensitivities became available, she was transitioned to Macrodantin. 3. Hypokalemia -improved with replacement.  Magnesium normal. 4. Sinus bradycardia-slightly improved, TSH in normal range, family is not aware of any past history of this level of bradycardia. She was monitored closely on telemetry.   Appears to be improving.  5. Chronic pain- patient is on several chronic pain medications.  Fentanyl was held over the last several days of hospitalization and she has not had any worsening pain.  We recommend discontinuing further opiates.  These can be readdressed by primary care physician if the need arises.   6. Benzodiazepine dependence-  she received as needed Ativan so as not to precipitate benzodiazepine withdrawal.  On discharge, we will reduce her dose of Xanax.  Hopefully this can be weaned off as an outpatient. 7. Acute renal failure-Resolved now after treating with IV fluid hydration. 8. Hypernatremia-Resolved. Secondary to dehydration, treated with hypotonic fluids.  9. HIV disease -continue home antiretrovirals   Discharge Instructions  Discharge Instructions    Diet - low sodium  heart healthy   Complete by:  As directed    Increase activity slowly   Complete by:  As directed       Allergies as of 08/22/2017      Reactions   Hctz [hydrochlorothiazide] Other (See Comments)   Dizziness, syncope. Does not wish to take anymore      Medication List    STOP taking these medications   fentaNYL 12 MCG/HR Commonly known as:  DURAGESIC - dosed mcg/hr   HYDROcodone-acetaminophen 10-325 MG tablet Commonly known as:  [place] these medications   alendronate 70 MG tablet Commonly known as:  FOSAMAX Take 1 tablet (70 mg total) by mouth once a week.   ALPRAZolam 1 MG tablet Commonly known as:  XANAX Take 0.5 tablets (0.5 mg total) by mouth 3 (three) times daily as needed for anxiety. What changed:  how much to take   amLODipine 10 MG tablet Commonly known as:  NORVASC Take 1 tablet (10 mg total) by mouth daily.   aspirin 81 MG EC tablet Take 81 mg by mouth daily.   atenolol 100 MG tablet Commonly known as:  TENORMIN Take 1 tablet (100 mg total) by mouth daily.   atorvastatin 10 MG tablet Commonly known as:  LIPITOR TAKE 1 TABLET BY MOUTH EVERY DAY (STOP LOVASTATIN)   baclofen 10 MG tablet Commonly known as:  LIORESAL Take 10 mg by mouth 3 (three) times daily as needed. for muscle spams   benazepril 40 MG tablet Commonly known as:  LOTENSIN TAKE ONE TABLET BY MOUTH DAILY   bictegravir-emtricitabine-tenofovir AF 50-200-25 MG Tabs tablet Commonly known as:  BIKTARVY Take 1 tablet by mouth daily.   calcium-vitamin D 500-200 MG-UNIT tablet Commonly known as:  OSCAL WITH D Take 1 tablet by mouth 2 (two) times daily.   COMBIGAN 0.2-0.5 % ophthalmic solution Generic drug:  brimonidine-timolol Place 1 drop into the left eye 2 (two) times daily.   diclofenac sodium 1 % Gel Commonly known as:  VOLTAREN Apply 2 g topically 4 (four) times daily.   docusate sodium 100 MG capsule Commonly known as:  COLACE Take 100 mg by mouth 2 (two) times daily.   dorzolamide 2 % ophthalmic solution Commonly known as:  TRUSOPT Place 1 drop into both eyes 2  (two) times daily.   hydrALAZINE 25 MG tablet Commonly known as:  APRESOLINE Take 1 tablet (25 mg total) by mouth 3 (three) times daily.   ibuprofen 200 MG tablet Commonly known as:  ADVIL,MOTRIN Take 400 mg by mouth 3 (three) times daily as needed for pain.   latanoprost 0.005 % ophthalmic solution Commonly known as:  XALATAN Place 1 drop into both eyes at bedtime.   multivitamin with minerals Tabs tablet Take 1 tablet by mouth daily.   Naloxone HCl 0.4 MG/0.4ML Soaj Commonly known as:  EVZIO Inject 0.4 mg as directed once as needed. For overdose and dial 911. May give another dose if needed.   nitrofurantoin (macrocrystal-monohydrate) 100 MG capsule Commonly known as:  MACROBID Take 1 capsule (100 mg total) by mouth every 12 (twelve) hours.   pantoprazole 40 MG tablet Commonly known as:  PROTONIX TAKE ONE TABLET BY MOUTH EVERY DAY   spironolactone 100 MG tablet Commonly known as:  ALDACTONE Take 1 tablet (100 mg total) by mouth daily.   terazosin 5 MG capsule Commonly known as:  HYTRIN Take 1 capsule (5 mg total) by mouth at bedtime.       Allergies  Allergen Reactions  . Hctz [Hydrochlorothiazide] Other (See Comments)    Dizziness, syncope. Does not wish to take anymore    Consultations:  Neurology   Procedures/Studies: Ct Head Wo Contrast  Result Date: 08/17/2017 CLINICAL DATA:  Generalize weakness over the last 3 days. Confusion. EXAM: CT HEAD WITHOUT CONTRAST TECHNIQUE: Contiguous axial images were obtained from the base of the skull through the vertex without intravenous contrast. COMPARISON:  07/27/2013 FINDINGS: Brain: Generalized atrophy. No evidence of old or acute focal infarction, mass lesion, hemorrhage, hydrocephalus or extra-axial collection. Vascular: There is atherosclerotic calcification of the major vessels at the base of the brain. Skull: Negative Sinuses/Orbits: Clear/normal Other: None IMPRESSION: No acute or reversible finding.  Age related  atrophy. Electronically Signed   By: Nelson Chimes M.D.   On: 08/17/2017 13:10   Ct Chest W Contrast  Result Date: 08/17/2017 CLINICAL DATA:  Generalized weakness for the past 3 days.  Smoker. EXAM: CT CHEST WITH CONTRAST TECHNIQUE: Multidetector CT imaging of the chest was performed during intravenous contrast administration. CONTRAST:  61mL OMNIPAQUE IOHEXOL 300 MG/ML  SOLN COMPARISON:  Chest radiographs dated 01/09/2015 and chest CTA dated 02/03/2012. FINDINGS: Cardiovascular: Atheromatous calcifications, including the coronary arteries and aorta. Mildly enlarged heart. Mediastinum/Nodes: No enlarged lymph nodes. 9 mm left lobe thyroid nodule. Tiny right lobe nodules. Lungs/Pleura: Mild bilateral dependent atelectasis. Mild bilateral bullous changes and mild peribronchial thickening. No lung nodules or pleural fluid. Upper Abdomen: Cholecystectomy clips. Musculoskeletal: Mild thoracic and lower cervical spine degenerative changes. IMPRESSION: 1. No acute abnormality. 2.  Calcific coronary artery and aortic atherosclerosis. 3. Mild changes of COPD and chronic bronchitis. 4. Mild cardiomegaly. Aortic Atherosclerosis (ICD10-I70.0) and Emphysema (ICD10-J43.9). Electronically Signed   By: Claudie Revering M.D.   On: 08/17/2017 13:30   Mr Brain Wo Contrast  Result Date: 08/17/2017 CLINICAL DATA:  Altered level of consciousness.  Weakness for 3 days EXAM: MRI HEAD WITHOUT CONTRAST TECHNIQUE: Multiplanar, multiecho pulse sequences of the brain and surrounding structures were obtained without intravenous contrast. COMPARISON:  Head CT from earlier today.  Brain MRI 07/23/2011 FINDINGS: Brain: No acute infarction, hemorrhage, hydrocephalus, extra-axial collection or mass lesion. Small remote left cerebellar infarct. Age normal brain volume. Vascular: Major flow voids are preserved Skull and upper cervical spine: No evidence of marrow lesion Sinuses/Orbits: Negative Other: For base of motion degradation, reportedly best  obtainable. IMPRESSION: 1. No acute finding. 2. Small remote left cerebellar infarct.  Otherwise age normal exam. 3. Motion degraded. Electronically Signed   By: Monte Fantasia M.D.   On: 08/17/2017 15:49       Subjective: Feeling better today. No chest pain or shortness of breath  Discharge Exam: Vitals:   08/21/17 2152 08/22/17 0553  BP: (!) 164/62 (!) 182/62  Pulse: 65 (!) 59  Resp: 15 15  Temp: 98.6 F (37 C) 98.2 F (36.8 C)  SpO2: 97% 100%   Vitals:   08/21/17 1510 08/21/17 1803 08/21/17 2152 08/22/17 0553  BP: (!) 171/54 (!) 155/55 (!) 164/62 (!) 182/62  Pulse: (!) 52 (!) 53 65 (!) 59  Resp: 18 18 15 15   Temp: 98.1 F (36.7 C) 98.1 F (36.7 C) 98.6 F (37 C) 98.2 F (36.8 C)  TempSrc:      SpO2: 98% 98% 97% 100%  Weight:    56.5 kg (124 lb 9 oz)  Height:        General: Pt is alert, awake, not in acute distress Cardiovascular: RRR, S1/S2 +, no rubs, no gallops  Respiratory: CTA bilaterally, no wheezing, no rhonchi Abdominal: Soft, NT, ND, bowel sounds + Extremities: no edema, no cyanosis    The results of significant diagnostics from this hospitalization (including imaging, microbiology, ancillary and laboratory) are listed below for reference.     Microbiology: Recent Results (from the past 240 hour(s))  Urine culture     Status: Abnormal   Collection Time: 08/17/17  4:00 PM  Result Value Ref Range Status   Specimen Description   Final    URINE, CLEAN CATCH Performed at [facility], [address], [place], [place] [address]    Special Requests   Final    NONE Performed at [facility], [address] [place], [place] [address]    Culture (A)  Final    >=100,000 COLONIES/mL ESCHERICHIA COLI >=100,000 COLONIES/mL VANCOMYCIN RESISTANT ENTEROCOCCUS    Report Status 08/20/2017 FINAL  Final   Organism ID, Bacteria ESCHERICHIA COLI (A)  Final   Organism ID, Bacteria VANCOMYCIN RESISTANT ENTEROCOCCUS (A)  Final      Susceptibility   Escherichia  coli - MIC*    AMPICILLIN >=32 RESISTANT Resistant     CEFAZOLIN <=4 SENSITIVE Sensitive     CEFTRIAXONE <=1 SENSITIVE Sensitive     CIPROFLOXACIN >=4 RESISTANT Resistant     GENTAMICIN <=1 SENSITIVE Sensitive     IMIPENEM <=0.25 SENSITIVE Sensitive     NITROFURANTOIN <=16 SENSITIVE Sensitive     TRIMETH/SULFA <=20 SENSITIVE Sensitive     AMPICILLIN/SULBACTAM 16 INTERMEDIATE Intermediate     PIP/TAZO <=4 SENSITIVE Sensitive     Extended ESBL NEGATIVE Sensitive     * >=100,000 COLONIES/mL ESCHERICHIA COLI   Vancomycin resistant enterococcus - MIC*    AMPICILLIN 8 SENSITIVE Sensitive     LEVOFLOXACIN 0.5 SENSITIVE Sensitive     NITROFURANTOIN <=16 SENSITIVE Sensitive     VANCOMYCIN >=32 RESISTANT Resistant     * >=100,000 COLONIES/mL VANCOMYCIN RESISTANT ENTEROCOCCUS  MRSA PCR Screening     Status: None   Collection Time: 08/17/17  4:23 PM  Result Value Ref Range Status   MRSA by PCR NEGATIVE NEGATIVE Final    Comment:        The GeneXpert MRSA Assay (FDA approved for NASAL specimens only), is one component of a comprehensive MRSA colonization surveillance program. It is not intended to diagnose MRSA infection nor to guide or monitor treatment for MRSA infections. Performed at [facility], [address], [place], [place] [address]   Culture, blood (Routine X 2) w Reflex to ID Panel     Status: None   Collection Time: 08/17/17  5:17 PM  Result Value Ref Range Status   Specimen Description LEFT ANTECUBITAL  Final   Special Requests   Final    BOTTLES DRAWN AEROBIC AND ANAEROBIC Blood Culture adequate volume   Culture   Final    NO GROWTH 5 DAYS Performed at [facility], [address] [place], [place] [address]    Report Status 08/22/2017 FINAL  Final  Culture, blood (Routine X 2) w Reflex to ID Panel     Status: None   Collection Time: 08/17/17  5:21 PM  Result Value Ref Range Status   Specimen Description BLOOD RIGHT ARM  Final   Special Requests   Final    BOTTLES  DRAWN AEROBIC ONLY Blood Culture results may not be optimal due to an inadequate volume of blood received in culture bottles   Culture   Final    NO GROWTH 5 DAYS Performed at  [facility], [address] [place], [place] [address]    Report Status 08/22/2017 FINAL  Final     Labs: BNP (last 3 results) No results for input(s): BNP in the last 8760 hours. Basic Metabolic Panel: Recent Labs  Lab 08/18/17 0502 08/19/17 0827 08/20/17 0500 08/21/17 0426 08/22/17 0510  NA 144 139 142 142 143  K 3.0* 3.0* 3.5 3.5 3.7  CL 109 107 112* 114* 114*  CO2 25 21* 20* 19* 20*  GLUCOSE 150* 122* 121* 103* 115*  BUN 13 6 8 11 9   CREATININE 0.64 0.56 0.53 0.55 0.61  CALCIUM 9.9 9.7 9.5 9.5 9.5  MG 1.8 1.9 1.8  --   --    Liver Function Tests: Recent Labs  Lab 08/18/17 0502 08/19/17 0827 08/20/17 0500 08/21/17 0426 08/22/17 0510  AST 155* 116* 94* 70* 58*  ALT 71* 63* 58* 51 46  ALKPHOS 56 52 51 47 50  BILITOT 0.8 1.4* 1.1 1.3* 1.1  PROT 8.5* 8.0 7.5 7.3 7.5  ALBUMIN 4.1 3.8 3.7 3.7 3.8   No results for input(s): LIPASE, AMYLASE in the last 168 hours. Recent Labs  Lab 08/17/17 1329 08/18/17 0502  AMMONIA 34 40*   CBC: Recent Labs  Lab 08/17/17 1119 08/18/17 0502 08/19/17 0827 08/20/17 0500  WBC 10.4 9.7 9.0 8.5  NEUTROABS  --  5.5 6.2 4.4  HGB 13.7 15.5* 14.4 14.2  HCT 41.8 44.8 42.1 42.1  MCV 97.2 95.9 93.8 94.4  PLT 224 276 288 302   Cardiac Enzymes: Recent Labs  Lab 08/17/17 1719 08/17/17 2158 08/18/17 0502  TROPONINI 0.03* 0.03* <0.03   BNP: Invalid input(s): POCBNP CBG: Recent Labs  Lab 08/17/17 1055 08/17/17 2210  GLUCAP 117* 181*   D-Dimer No results for input(s): DDIMER in the last 72 hours. Hgb A1c No results for input(s): HGBA1C in the last 72 hours. Lipid Profile No results for input(s): CHOL, HDL, LDLCALC, TRIG, CHOLHDL, LDLDIRECT in the last 72 hours. Thyroid function studies Recent Labs    08/19/17 1120  TSH 3.593   Anemia work  up Recent Labs    08/20/17 1246  VITAMINB12 193   Urinalysis    Component Value Date/Time   COLORURINE YELLOW 08/17/2017 1600   APPEARANCEUR HAZY (A) 08/17/2017 1600   LABSPEC 1.024 08/17/2017 1600   PHURINE 6.0 08/17/2017 1600   GLUCOSEU NEGATIVE 08/17/2017 1600   GLUCOSEU NEG mg/dL 07/15/2009 2025   HGBUR LARGE (A) 08/17/2017 1600   HGBUR trace-intact 01/15/2008 1032   BILIRUBINUR NEGATIVE 08/17/2017 1600   KETONESUR 5 (A) 08/17/2017 1600   PROTEINUR NEGATIVE 08/17/2017 1600   UROBILINOGEN 0.2 09/03/2014 2205   NITRITE POSITIVE (A) 08/17/2017 1600   LEUKOCYTESUR SMALL (A) 08/17/2017 1600   Sepsis Labs Invalid input(s): PROCALCITONIN,  WBC,  LACTICIDVEN Microbiology Recent Results (from the past 240 hour(s))  Urine culture     Status: Abnormal   Collection Time: 08/17/17  4:00 PM  Result Value Ref Range Status   Specimen Description   Final    URINE, CLEAN CATCH Performed at [facility], [address], [place], [place] [address]    Special Requests   Final    NONE Performed at [facility], [address] [place], [place] [address]    Culture (A)  Final    >=100,000 COLONIES/mL ESCHERICHIA COLI >=100,000 COLONIES/mL VANCOMYCIN RESISTANT ENTEROCOCCUS    Report Status 08/20/2017 FINAL  Final   Organism ID, Bacteria ESCHERICHIA COLI (A)  Final   Organism ID, Bacteria  VANCOMYCIN RESISTANT ENTEROCOCCUS (A)  Final      Susceptibility   Escherichia coli - MIC*    AMPICILLIN >=32 RESISTANT Resistant     CEFAZOLIN <=4 SENSITIVE Sensitive     CEFTRIAXONE <=1 SENSITIVE Sensitive     CIPROFLOXACIN >=4 RESISTANT Resistant     GENTAMICIN <=1 SENSITIVE Sensitive     IMIPENEM <=0.25 SENSITIVE Sensitive     NITROFURANTOIN <=16 SENSITIVE Sensitive     TRIMETH/SULFA <=20 SENSITIVE Sensitive     AMPICILLIN/SULBACTAM 16 INTERMEDIATE Intermediate     PIP/TAZO <=4 SENSITIVE Sensitive     Extended ESBL NEGATIVE Sensitive     * >=100,000 COLONIES/mL ESCHERICHIA COLI   Vancomycin  resistant enterococcus - MIC*    AMPICILLIN 8 SENSITIVE Sensitive     LEVOFLOXACIN 0.5 SENSITIVE Sensitive     NITROFURANTOIN <=16 SENSITIVE Sensitive     VANCOMYCIN >=32 RESISTANT Resistant     * >=100,000 COLONIES/mL VANCOMYCIN RESISTANT ENTEROCOCCUS  MRSA PCR Screening     Status: None   Collection Time: 08/17/17  4:23 PM  Result Value Ref Range Status   MRSA by PCR NEGATIVE NEGATIVE Final    Comment:        The GeneXpert MRSA Assay (FDA approved for NASAL specimens only), is one component of a comprehensive MRSA colonization surveillance program. It is not intended to diagnose MRSA infection nor to guide or monitor treatment for MRSA infections. Performed at [facility], [address] [place], [place] [address]   Culture, blood (Routine X 2) w Reflex to ID Panel     Status: None   Collection Time: 08/17/17  5:17 PM  Result Value Ref Range Status   Specimen Description LEFT ANTECUBITAL  Final   Special Requests   Final    BOTTLES DRAWN AEROBIC AND ANAEROBIC Blood Culture adequate volume   Culture   Final    NO GROWTH 5 DAYS Performed at [facility], [address], [place], [place] [address]    Report Status 08/22/2017 FINAL  Final  Culture, blood (Routine X 2) w Reflex to ID Panel     Status: None   Collection Time: 08/17/17  5:21 PM  Result Value Ref Range Status   Specimen Description BLOOD RIGHT ARM  Final   Special Requests   Final    BOTTLES DRAWN AEROBIC ONLY Blood Culture results may not be optimal due to an inadequate volume of blood received in culture bottles   Culture   Final    NO GROWTH 5 DAYS Performed at [facility], [address] [place], [place] [address]    Report Status 08/22/2017 FINAL  Final     Time coordinating discharge: 43mins  SIGNED:   Kathie Dike, MD  [facility] 08/22/2017, 10:12 AM Pager   If 7PM-7AM, please contact night-coverage [email or web address] Password [email or web address]

## 2017-08-22 NOTE — Progress Notes (Signed)
IV removed, 2x2 gauze and paper tape applied to site, patient tolerated well.  Reviewed AVS/ discharge information with patient's daughter, Jessie Foot.  Patient to be transported home by daughter.

## 2017-08-22 NOTE — Care Management Note (Signed)
Case Management Note  Patient Details  Name: Jessica [name] MRN: [id number] Date of Birth: [birth year]   Action/Plan:  DC in next 24-48 hrs. Pt says she and her daughter are planning to move in together in the near future. Pt is ind with ADL's pta. PT has recommends HH PT. Pt agreeable, has chosen [facility] from list of providers. Pt aware HH has 48 hrs to make first visit. Mered[facility] Ide rep, aware of referral and will pull orders from chart.   Expected Discharge Date:     08/22/17             Expected Discharge Plan:  [place]  In-House Referral:  NA  Discharge planning Services  CM Consult  Post Acute Care Choice:  Home Health Choice offered to:  Patient  HH Arranged:  PT [place]:  [place]  Status of Service:  Completed, signed off    Sherald Barge, RN 08/22/2017, 9:39 AM

## 2017-08-23 ENCOUNTER — Other Ambulatory Visit: Payer: Self-pay | Admitting: Internal Medicine

## 2017-08-23 DIAGNOSIS — M5136 Other intervertebral disc degeneration, lumbar region: Secondary | ICD-10-CM

## 2017-08-23 DIAGNOSIS — M810 Age-related osteoporosis without current pathological fracture: Secondary | ICD-10-CM

## 2017-08-23 DIAGNOSIS — Z1231 Encounter for screening mammogram for malignant neoplasm of breast: Secondary | ICD-10-CM

## 2017-08-23 DIAGNOSIS — I10 Essential (primary) hypertension: Secondary | ICD-10-CM

## 2017-08-23 NOTE — Telephone Encounter (Signed)
Review of recent discharge summary notes that the fentanyl patch was discontinued during the hospitalization and there was no increase in the chronic pain.  It was not on her discharge medication list.  I therefore called her pharmacy to formally discontinue the fentanyl patch.

## 2017-08-27 ENCOUNTER — Other Ambulatory Visit: Payer: Self-pay | Admitting: Internal Medicine

## 2017-08-27 DIAGNOSIS — F411 Generalized anxiety disorder: Secondary | ICD-10-CM

## 2017-08-27 MED ORDER — ALPRAZOLAM 0.5 MG PO TABS: 0.5000 mg | ORAL_TABLET | Freq: Three times a day (TID) | ORAL | 5 refills | Status: DC | PRN

## 2017-09-06 ENCOUNTER — Other Ambulatory Visit: Payer: Medicare HMO

## 2017-09-07 ENCOUNTER — Telehealth: Payer: Self-pay | Admitting: Internal Medicine

## 2017-09-07 DIAGNOSIS — M5136 Other intervertebral disc degeneration, lumbar region: Secondary | ICD-10-CM

## 2017-09-07 NOTE — Telephone Encounter (Signed)
Requesting refill on pain medicine and fentol(sp), pls snd to [facility] [employer]

## 2017-09-10 MED ORDER — HYDROCODONE-ACETAMINOPHEN 10-325 MG PO TABS
1.0000 | ORAL_TABLET | Freq: Three times a day (TID) | ORAL | 0 refills | Status: DC | PRN
Start: 2017-11-15 — End: 2017-11-14

## 2017-09-10 MED ORDER — HYDROCODONE-ACETAMINOPHEN 10-325 MG PO TABS: 1.0000 | ORAL_TABLET | Freq: Three times a day (TID) | ORAL | 0 refills | Status: DC | PRN

## 2017-09-10 NOTE — Telephone Encounter (Signed)
Per front office patient calling to check on med refills. Will resend to PCP for consideration. Hubbard Hartshorn, RN, BSN

## 2017-09-10 NOTE — Telephone Encounter (Signed)
Daughter, [name], is calling back to speak with Dr. Eppie Gibson. Please call [phone]. Hubbard Hartshorn, RN, BSN

## 2017-09-10 NOTE — Telephone Encounter (Signed)
[name] was recently admitted to [facility] for an acute encephalopathy felt to be secondary to medication overdose.  The fentanyl was discontinued and the alprazolam dose was cut to 0.5 mg Q8H PRN.  I returned her call as she was requesting a refill of her fentanyl patches.  When I spoke with her she stated she had started back on the fentanyl when she got home.  She likely was using the patches she had left as the prescription was filled on the day she was admitted (6/7) .  I mentioned to her that the fentanyl had been stopped during the hospitalization, at which point the phone cut-off.  I call the number back and her daughter answered the phone.  It then immediately cut-off again.  I am unwilling to prescribe the fentanyl patches at this time as she was reportedly doing well from a pain control standpoint off of the fentanyl during the rest of her admission.  Her alprazolam has dose has been cut to 0.5 mg Q8H PRN (she picked this up on 08/29/17).  I suspect she has also been taking her hydrocodone-acetaminophen 10-325 mg 1 tablet every 8 hours to #90 per month which she also picked up on 08/17/2017).  I have no doubt she has chronic pain and she states it has responded well to the hydrocodone during past visits.  I plan on represcribing the hydrocodone-acetaminophen 10-325 1 tablet Q8H PRN disp #90 for her chronic pain, which has responded to this medication in the past.  I have just placed this order for pick-up when next due (09/16/2017).  I subsequently received another message to call back and when I did I was sent to voice mail and stated that I am returning the call as requested.

## 2017-09-11 ENCOUNTER — Other Ambulatory Visit: Payer: Self-pay

## 2017-09-11 ENCOUNTER — Ambulatory Visit (INDEPENDENT_AMBULATORY_CARE_PROVIDER_SITE_OTHER): Payer: Medicare HMO | Admitting: Internal Medicine

## 2017-09-11 ENCOUNTER — Other Ambulatory Visit: Payer: Medicare HMO

## 2017-09-11 VITALS — BP 147/61 | HR 50 | Temp 98.3°F | Wt 131.0 lb

## 2017-09-11 DIAGNOSIS — B20 Human immunodeficiency virus [HIV] disease: Secondary | ICD-10-CM | POA: Diagnosis not present

## 2017-09-11 DIAGNOSIS — R350 Frequency of micturition: Secondary | ICD-10-CM | POA: Diagnosis not present

## 2017-09-11 DIAGNOSIS — Z87440 Personal history of urinary (tract) infections: Secondary | ICD-10-CM

## 2017-09-11 DIAGNOSIS — Z113 Encounter for screening for infections with a predominantly sexual mode of transmission: Secondary | ICD-10-CM

## 2017-09-11 DIAGNOSIS — F1320 Sedative, hypnotic or anxiolytic dependence, uncomplicated: Secondary | ICD-10-CM | POA: Diagnosis not present

## 2017-09-11 DIAGNOSIS — H409 Unspecified glaucoma: Secondary | ICD-10-CM

## 2017-09-11 DIAGNOSIS — I10 Essential (primary) hypertension: Secondary | ICD-10-CM | POA: Diagnosis not present

## 2017-09-11 DIAGNOSIS — G8929 Other chronic pain: Secondary | ICD-10-CM

## 2017-09-11 DIAGNOSIS — F1120 Opioid dependence, uncomplicated: Secondary | ICD-10-CM

## 2017-09-11 DIAGNOSIS — Z79899 Other long term (current) drug therapy: Secondary | ICD-10-CM

## 2017-09-11 DIAGNOSIS — R69 Illness, unspecified: Secondary | ICD-10-CM | POA: Diagnosis not present

## 2017-09-11 DIAGNOSIS — I739 Peripheral vascular disease, unspecified: Secondary | ICD-10-CM | POA: Diagnosis not present

## 2017-09-11 DIAGNOSIS — R600 Localized edema: Secondary | ICD-10-CM | POA: Diagnosis not present

## 2017-09-11 DIAGNOSIS — K5909 Other constipation: Secondary | ICD-10-CM | POA: Diagnosis not present

## 2017-09-11 LAB — POCT URINALYSIS DIPSTICK
Bilirubin, UA: NEGATIVE
GLUCOSE UA: NEGATIVE
Ketones, UA: NEGATIVE
Leukocytes, UA: NEGATIVE
Nitrite, UA: NEGATIVE
PH UA: 6 (ref 5.0–8.0)
Protein, UA: NEGATIVE
RBC UA: NEGATIVE
Spec Grav, UA: 1.015 (ref 1.010–1.025)
UROBILINOGEN UA: 1 U/dL

## 2017-09-11 NOTE — Progress Notes (Signed)
CC: Bilateral lower extremity edema  HPI: [name] is a 77 y.o. w/ PMH of aortbifemoral bypass in 2009, chronic pain, opiod dependence, benzo dependence, chronic constipation, HIV, PVD, glaucoma, who is presenting for new onset bilateral lower extremity edema. She was recently hospitalized for acute toxic encephalopathy and was found to have a UTI and UDS was positive for THC. She completed a course of macrodantin for her UTI, fentanyl was held without worsening pain, Xanax was cut in half, and hydralazine was added as a new medication. She was only given a 7 day supply of hydralazine and last took it 10 days ago.   Please see problem based charting for full details of HPI.   Past Medical History:  Diagnosis Date  . Acute renal failure (ARF) ([place]) 08/17/2017  . Anxiety 04/05/2012  . Bilateral sensorineural hearing loss 06/11/2014   Mild to moderate on the left side and slight to mild on the right side per audiometry 05/2014.  Hearing aides with possible masking of tinnitus recommended but patient wished to defer secondary to finances.  . Blood transfusion without reported diagnosis    pt denies  . Bursitis of right shoulder 07/12/2012   s/p shoulder injection 07/12/2012   . Cataract of right eye   . Constipation due to pain medication 04/27/2010  . Diverticulosis 02/08/2012   Extensive left-sided diverticula on colonoscopy March 2012 per Dr. Gala Romney   . Essential hypertension 07/20/2006  . Genital herpes 07/20/2006  . 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 Heart murmur 1961  . Human immunodeficiency virus disease ([place]) 03/27/1986  . Hyperlipidemia LDL goal < 100 04/05/2012  . Long-term current use of opiate analgesic 03/17/2016  . Lumbar degenerative disc disease 07/20/2006   With chronic back pain   . Marijuana use 07/03/2016  . Micturition syncope 09/20/2015  . 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[place]) 11/01/2011   s/p aortobifem bypass 2009   . Periumbilical hernia 05/16/1441   1 cm left  periumbilical abdominal wall defect  . 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 Right rotator cuff tear 02/01/2013   Responds to periodic steroid injections  . Seborrhea 09/01/2010  . Small bowel obstruction due to adhesions ([place]) 02/08/2012   s/p Exploratory laparotomy, lysis of adhesions 02/12/12    . 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 Tobacco abuse 02/19/2012  . Tobacco abuse   . Vasovagal syncope 02/15/2015  . Voiding dysfunction    s/p cystoscopy and meatal dilation Dec 2005   Review of Systems:   General: Denies fever, chills, fatigue, unexpected weight loss, change in appetite and diaphoresis.  Respiratory: Denies SOB, cough, DOE, +chest tightness Cardiovascular: Denies chest pain and palpitations.  Gastrointestinal: Denies nausea, vomiting, abdominal pain, Genitourinary: +frequency, Denies dysuria, urgency, hematuria, suprapubic pain and flank pain. Neurological: Denies dizziness, headaches, weakness, lightheadedness, numbness, seizures, and syncope.   Physical Exam:  Vitals:   09/11/17 1402  BP: (!) 147/61  Pulse: (!) 50  Temp: 98.3 F (36.8 C)  TempSrc: Oral  SpO2: 96%  Weight: 131 lb (59.4 kg)   General: Vital signs reviewed.  Patient is well-developed and well-nourished, in no acute distress and cooperative with exam.  Head: Normocephalic and atraumatic. Eyes: EOMI, conjunctivae normal, no scleral icterus.  Neck: Supple, trachea midline, normal ROM, no JVD, masses, thyromegaly Cardiovascular: RRR, S1 normal, S2 normal, no murmurs, gallops, or rubs. Pulmonary/Chest: Trace crackles in bilateral lung bases Abdominal: Soft,  non-tender, non-distended, BS +, no masses, organomegaly, or guarding present.  Extremities: 2+ lower extremity edema bilaterally around ankles with tenderness to palpation, no erythema, pulses symmetric and intact bilaterally. No cyanosis or clubbing.   Assessment & Plan:   See  Encounters Tab for problem based charting.  Patient seen with Dr. Rebeca Alert

## 2017-09-11 NOTE — Telephone Encounter (Addendum)
Pt seen in [facility] on 7/2  for bilateral lower ext edema (echo scheduled for 7/8).  Pt was informed that pcp will not refill her fentanyl.  Pt states that her alprazolam dose was cut to 0.5mg  Q8H as needed-would like to go back on the 1mg  tabs because she is unable to sleep at current dose.  Discussed with pcp-pt can take 1mg  at night as needed, but should not take more than 3 (0.5mg  tabs) daily.  Pt informed and agreed with plan.Despina Hidden Cassady7/3/20192:26 PM

## 2017-09-11 NOTE — Patient Instructions (Signed)
It was nice seeing you today. Thank you for choosing [facility] for your Primary Care.   Today we talked about:  1) Leg swelling: Most likely related to medication changes. Just to be safe, we will check for protein in your urine and order a echocardiogram (ultrasound) for your heart to make sure you don't have heart failure. We will not make any medication changes at this time    FOLLOW-UP INSTRUCTIONS When: 1-2 months with Dr. Eppie Gibson, sooner if swelling does not get better For: leg swelling  What to bring: medications   Please contact the clinic if you have any problems, or need to be seen sooner.

## 2017-09-11 NOTE — Assessment & Plan Note (Addendum)
Assessment: 3 days of new onset bilateral, symmetric lower extremity edema that is now improving. Hospital admission 3 weeks ago for toxic encephalopathy. Hydralazine added to her med list at that time. Most likely related to medication changes. Less likely heart failure given lack of DOE, SOB, and JVD, albeit patient has strong family history of CHF. Less likely DVT given symmetric, bilateral edema, no h/o malignancy, and no erythema. Less likely nephrotic syndrome due to lack of frothy urine.   Plan: - STOP hydralazine, blood pressure okay today. Monitor leg swelling - Echo scheduled for 7/8  - Urine dipstick negative for protein - f/u 1-2 months with PCP

## 2017-09-12 LAB — RPR: RPR: NONREACTIVE

## 2017-09-12 LAB — CBC WITH DIFFERENTIAL/PLATELET
BASOS ABS: 32 {cells}/uL (ref 0–200)
Basophils Relative: 0.4 %
EOS PCT: 2.2 %
Eosinophils Absolute: 174 cells/uL (ref 15–500)
HEMATOCRIT: 34.7 % — AB (ref 35.0–45.0)
Hemoglobin: 11.8 g/dL (ref 11.7–15.5)
LYMPHS ABS: 4369 {cells}/uL — AB (ref 850–3900)
MCH: 32.9 pg (ref 27.0–33.0)
MCHC: 34 g/dL (ref 32.0–36.0)
MCV: 96.7 fL (ref 80.0–100.0)
MPV: 9.6 fL (ref 7.5–12.5)
Monocytes Relative: 5.9 %
NEUTROS PCT: 36.2 %
Neutro Abs: 2860 cells/uL (ref 1500–7800)
PLATELETS: 280 10*3/uL (ref 140–400)
RBC: 3.59 10*6/uL — ABNORMAL LOW (ref 3.80–5.10)
RDW: 13.7 % (ref 11.0–15.0)
TOTAL LYMPHOCYTE: 55.3 %
WBC: 7.9 10*3/uL (ref 3.8–10.8)
WBCMIX: 466 {cells}/uL (ref 200–950)

## 2017-09-12 LAB — COMPLETE METABOLIC PANEL WITH GFR
AG Ratio: 1.4 (calc) (ref 1.0–2.5)
ALKALINE PHOSPHATASE (APISO): 60 U/L (ref 33–130)
ALT: 19 U/L (ref 6–29)
AST: 21 U/L (ref 10–35)
Albumin: 4.2 g/dL (ref 3.6–5.1)
BILIRUBIN TOTAL: 0.4 mg/dL (ref 0.2–1.2)
BUN: 16 mg/dL (ref 7–25)
CHLORIDE: 111 mmol/L — AB (ref 98–110)
CO2: 25 mmol/L (ref 20–32)
CREATININE: 0.93 mg/dL (ref 0.60–0.93)
Calcium: 9.4 mg/dL (ref 8.6–10.4)
GFR, Est African American: 70 mL/min/{1.73_m2} (ref >=60)
GFR, Est Non African American: 60 mL/min/{1.73_m2} (ref >=60)
GLUCOSE: 85 mg/dL (ref 65–99)
Globulin: 2.9 g/dL (calc) (ref 1.9–3.7)
Potassium: 3.4 mmol/L — ABNORMAL LOW (ref 3.5–5.3)
Sodium: 145 mmol/L (ref 135–146)
Total Protein: 7.1 g/dL (ref 6.1–8.1)

## 2017-09-12 LAB — LIPID PANEL
Cholesterol: 138 mg/dL (ref <200)
HDL: 63 mg/dL (ref >50)
LDL Cholesterol (Calc): 56 mg/dL (calc)
NON-HDL CHOLESTEROL (CALC): 75 mg/dL (ref <130)
Total CHOL/HDL Ratio: 2.2 (calc) (ref <5.0)
Triglycerides: 104 mg/dL (ref <150)

## 2017-09-14 LAB — HIV-1 RNA QUANT-NO REFLEX-BLD
HIV 1 RNA Quant: 38 copies/mL — ABNORMAL HIGH
HIV-1 RNA QUANT, LOG: 1.58 {Log_copies}/mL — AB

## 2017-09-14 LAB — T-HELPER CELL (CD4) - (RCID CLINIC ONLY)
CD4 % Helper T Cell: 39 % (ref 33–55)
CD4 T CELL ABS: 1700 /uL (ref 400–2700)

## 2017-09-14 NOTE — Progress Notes (Signed)
Internal Medicine Clinic Attending  I saw and evaluated the patient.  I personally confirmed the key portions of the history and exam documented by Dr. Donne Hazel and I reviewed pertinent patient test results.  The assessment, diagnosis, and plan were formulated together and I agree with the documentation in the resident's note.  Here with bilateral edema that began 3 days ago, improving slightly today. Unclear trigger, although recently took 1 week of hydralazine (for unclear indication), and combination with amlodipine could cause edema. Bilateral nature suggests against DVT, no other signs or symptoms. Urine negative for protein. No signs of thyroid dysfunction. Although CHF unlikely, last echo multiple years ago and she has faint bibasilar crackles, will check TTE to ensure no significant change in LVEF or valves. Otherwise, recommended elevation and compression stockings.   Lenice Pressman, M.D., Ph.D.

## 2017-09-17 ENCOUNTER — Other Ambulatory Visit (HOSPITAL_COMMUNITY): Payer: Medicare HMO

## 2017-09-18 ENCOUNTER — Other Ambulatory Visit: Payer: Self-pay | Admitting: Infectious Disease

## 2017-09-18 DIAGNOSIS — K219 Gastro-esophageal reflux disease without esophagitis: Secondary | ICD-10-CM

## 2017-09-21 ENCOUNTER — Encounter: Payer: Medicare HMO | Admitting: Infectious Disease

## 2017-09-24 ENCOUNTER — Ambulatory Visit (HOSPITAL_COMMUNITY)
Admission: RE | Admit: 2017-09-24 | Discharge: 2017-09-24 | Disposition: A | Discharge status: Home / Self Care | Payer: Medicare HMO | Source: Ambulatory Visit | Attending: Internal Medicine | Admitting: Internal Medicine

## 2017-09-24 DIAGNOSIS — F1290 Cannabis use, unspecified, uncomplicated: Secondary | ICD-10-CM | POA: Insufficient documentation

## 2017-09-24 DIAGNOSIS — R600 Localized edema: Secondary | ICD-10-CM | POA: Insufficient documentation

## 2017-09-24 DIAGNOSIS — Z79891 Long term (current) use of opiate analgesic: Secondary | ICD-10-CM | POA: Insufficient documentation

## 2017-09-24 DIAGNOSIS — I10 Essential (primary) hypertension: Secondary | ICD-10-CM | POA: Insufficient documentation

## 2017-09-24 DIAGNOSIS — Z720 Tobacco use: Secondary | ICD-10-CM | POA: Insufficient documentation

## 2017-09-24 DIAGNOSIS — E785 Hyperlipidemia, unspecified: Secondary | ICD-10-CM | POA: Diagnosis not present

## 2017-09-24 DIAGNOSIS — B20 Human immunodeficiency virus [HIV] disease: Secondary | ICD-10-CM | POA: Insufficient documentation

## 2017-09-24 DIAGNOSIS — I739 Peripheral vascular disease, unspecified: Secondary | ICD-10-CM | POA: Diagnosis not present

## 2017-09-24 DIAGNOSIS — R69 Illness, unspecified: Secondary | ICD-10-CM | POA: Diagnosis not present

## 2017-09-24 NOTE — Progress Notes (Signed)
*  PRELIMINARY RESULTS* Echocardiogram 2D Echocardiogram has been performed.  Jessica [name] 09/24/2017, 10:24 AM

## 2017-09-26 ENCOUNTER — Telehealth: Payer: Self-pay | Admitting: Internal Medicine

## 2017-09-28 ENCOUNTER — Encounter: Payer: Self-pay | Admitting: Internal Medicine

## 2017-09-28 ENCOUNTER — Other Ambulatory Visit: Payer: Self-pay | Admitting: Internal Medicine

## 2017-09-28 ENCOUNTER — Ambulatory Visit (INDEPENDENT_AMBULATORY_CARE_PROVIDER_SITE_OTHER): Payer: Medicare HMO | Admitting: Internal Medicine

## 2017-09-28 ENCOUNTER — Other Ambulatory Visit: Payer: Self-pay

## 2017-09-28 VITALS — BP 130/55 | HR 44 | Temp 98.8°F | Ht 67.9 in | Wt 126.7 lb

## 2017-09-28 DIAGNOSIS — I119 Hypertensive heart disease without heart failure: Secondary | ICD-10-CM | POA: Diagnosis not present

## 2017-09-28 DIAGNOSIS — Z8249 Family history of ischemic heart disease and other diseases of the circulatory system: Secondary | ICD-10-CM

## 2017-09-28 DIAGNOSIS — Z79899 Other long term (current) drug therapy: Secondary | ICD-10-CM

## 2017-09-28 DIAGNOSIS — F17210 Nicotine dependence, cigarettes, uncomplicated: Secondary | ICD-10-CM

## 2017-09-28 DIAGNOSIS — R001 Bradycardia, unspecified: Secondary | ICD-10-CM

## 2017-09-28 DIAGNOSIS — R609 Edema, unspecified: Secondary | ICD-10-CM | POA: Diagnosis not present

## 2017-09-28 DIAGNOSIS — I739 Peripheral vascular disease, unspecified: Secondary | ICD-10-CM | POA: Diagnosis not present

## 2017-09-28 DIAGNOSIS — I10 Essential (primary) hypertension: Secondary | ICD-10-CM

## 2017-09-28 DIAGNOSIS — I5189 Other ill-defined heart diseases: Secondary | ICD-10-CM | POA: Insufficient documentation

## 2017-09-28 DIAGNOSIS — R600 Localized edema: Secondary | ICD-10-CM

## 2017-09-28 DIAGNOSIS — R69 Illness, unspecified: Secondary | ICD-10-CM | POA: Diagnosis not present

## 2017-09-28 MED ORDER — HYDRALAZINE HCL 50 MG PO TABS: 50.0000 mg | ORAL_TABLET | Freq: Three times a day (TID) | ORAL | 0 refills | Status: DC

## 2017-09-28 MED ORDER — ATENOLOL 50 MG PO TABS: 50.0000 mg | ORAL_TABLET | Freq: Every day | ORAL | 3 refills | Status: DC

## 2017-09-28 MED ORDER — SPIRONOLACTONE 25 MG PO TABS: 25.0000 mg | ORAL_TABLET | Freq: Every day | ORAL | 3 refills | Status: DC

## 2017-09-28 MED ORDER — AMLODIPINE BESYLATE 5 MG PO TABS: 5.0000 mg | ORAL_TABLET | Freq: Every day | ORAL | 3 refills | Status: DC

## 2017-09-28 NOTE — Patient Instructions (Signed)
[name] we are changing some of your medications today to help with your swelling, keep your heart rate from being too low and to help control your high blood pressure.  We will decrease your atenolol to 50mg  daily and decrease your amlodipine to 5mg  daily.  We have increased your hydralazine to 50mg  3 times daily and increased your spironolactone to 125mg  daily.  We will also get you some compression stockings to help with your lower extremity swelling.  Please return in 3 weeks so we can check your labs and heart rate and see how you're doing.

## 2017-09-28 NOTE — Assessment & Plan Note (Signed)
ECHO peformed for LE edema shows G2DD. Pt with no other signs or symptoms consistent with congestive heart failure  -spoke with pt about ECHO results -no diuretic therapy, will continue good blood pressure control

## 2017-09-28 NOTE — Assessment & Plan Note (Addendum)
Pt evaluated earlier this month and worked up for causality of her bilateral lower extremity edema.  We went over results today with normal UA,  She has normal albumin, her ECHO shows G2DD.  She also has extensive peripheral vascular disease and is on several blood pressure medicines that could be contributing.  On exam today she does not have signs or symptoms consistent with volume overload.  No JVD.  I was able to lay her flat for about 5 minutes and she had no increased work of breathing or SOB.  She lays flat without difficulty at home as well.  I do not feel her lower extremity edema is related to congestive heart failure but she does have diastolic dysfunction which is not unexpected given her age and longstanding difficult to control hypertension requiring multiple agents.  Diuretic therapy at this point in time is not indicated.  -We will make some blood pressure med changes that will be favorable for her LE edema see HTN a/p -also we will fit the patient for TED hose as the socks she wore today allowed for resolution of there edema with minimal compression.

## 2017-09-28 NOTE — Assessment & Plan Note (Addendum)
BP Readings from Last 3 Encounters:  09/28/17 (!) 130/55  09/11/17 (!) 147/61  08/22/17 (!) 173/53   Patient's blood pressure is improved today, is improved since her hospitalization with the addition of hydralazine.  Patient bradycardic today, not symptomatic.  She has continued to have lower extremity edema and her diastolic blood pressure is low.  We will alter her blood pressure medicine some today to create a more favorable profile.    -Amlodipine 10 mg decreased to 5 mg daily -Atenolol 100 mg decreased to 50 mg daily -Hydralazine 25 mg 3 times daily increased to 50 mg 3 times daily -Spironolactone 100 mg increased to 125 mg daily -bp, hr and bmp check in 3 weeks

## 2017-09-28 NOTE — Progress Notes (Signed)
CC: LE edema follow up, ECHO results, HTN.    HPI:  [name] is a 77 y.o. female with PMH below.  She is here to address her HTN, LE edema and follow up on ECHO results.  Please see A&P for status of the patient's chronic medical conditions  Past Medical History:  Diagnosis Date  . Acute renal failure (ARF) ([place]) 08/17/2017  . Anxiety 04/05/2012  . Bilateral sensorineural hearing loss 06/11/2014   Mild to moderate on the left side and slight to mild on the right side per audiometry 05/2014.  Hearing aides with possible masking of tinnitus recommended but patient wished to defer secondary to finances.  . Blood transfusion without reported diagnosis    pt denies  . Bursitis of right shoulder 07/12/2012   s/p shoulder injection 07/12/2012   . Cataract of right eye   . Constipation due to pain medication 04/27/2010  . Diverticulosis 02/08/2012   Extensive left-sided diverticula on colonoscopy March 2012 per Dr. Gala Romney   . Essential hypertension 07/20/2006  . Genital herpes 07/20/2006  . 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 Heart murmur 1961  . Human immunodeficiency virus disease ([place]) 03/27/1986  . Hyperlipidemia LDL goal < 100 04/05/2012  . Long-term current use of opiate analgesic 03/17/2016  . Lumbar degenerative disc disease 07/20/2006   With chronic back pain   . Marijuana use 07/03/2016  . Micturition syncope 09/20/2015  . 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[place]) 11/01/2011   s/p aortobifem bypass 2009   . Periumbilical hernia 0/03/930   1 cm left periumbilical abdominal wall defect  . 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 Right rotator cuff tear 02/01/2013   Responds to periodic steroid injections  . Seborrhea 09/01/2010  . Small bowel obstruction due to adhesions ([place]) 02/08/2012   s/p Exploratory laparotomy, lysis of adhesions 02/12/12    . 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 Tobacco abuse 02/19/2012  .  Tobacco abuse   . Vasovagal syncope 02/15/2015  . Voiding dysfunction    s/p cystoscopy and meatal dilation Dec 2005   Review of Systems:  ROS: Pulmonary: pt denies increased work of breathing, shortness of breath,  Cardiac: pt denies palpitations, chest pain,  Abdominal: pt denies nausea, vomiting, or diarrhea  Physical Exam:  Vitals:   09/28/17 1049  BP: (!) 130/55  Pulse: (!) 44  Temp: 98.8 F (37.1 C)  TempSrc: Oral  SpO2: 98%  Weight: 126 lb 11.2 oz (57.5 kg)  Height: 5' 7.9" (1.725 m)   Physical Exam  Constitutional: No distress.  Neck: No JVD present.  Cardiovascular: Regular rhythm and normal heart sounds. Bradycardia present. Exam reveals no gallop and no friction rub.  No murmur heard. Pulmonary/Chest: Effort normal and breath sounds normal. No respiratory distress. She has no wheezes. She has no rales. She exhibits no tenderness.  Musculoskeletal:  No LE edema today but pt wearing tight knee high stockings  Neurological: She is alert.  Skin: She is not diaphoretic.    Social History   Socioeconomic History  . Marital status: Widowed    Spouse name: Not on file  . Number of children: 4  . Years of education: 2y college  . Highest education level: Not on file  Occupational History  . Occupation: retired    Comment: previously worked as a Designer, fashion/clothing for [employer]  . Financial resource strain: Not on  file  . Food insecurity:    Worry: Not on file    Inability: Not on file  . Transportation needs:    Medical: Not on file    Non-medical: Not on file  Tobacco Use  . Smoking status: Current Every Day Smoker    Packs/day: 0.50    Years: 50.00    Pack years: 25.00    Types: Cigarettes  . Smokeless tobacco: Never Used  . Tobacco comment: smoking .5 PPD  Substance and Sexual Activity  . Alcohol use: No    Alcohol/week: 0.0 oz    Comment: "last drink of alcohol ~ 1977"  . Drug use: No    Comment: cutting back on chantix, 1 cigarette this  morning  . Sexual activity: Never    Partners: Male  Lifestyle  . Physical activity:    Days per week: Not on file    Minutes per session: Not on file  . Stress: Not on file  Relationships  . Social connections:    Talks on phone: Not on file    Gets together: Not on file    Attends religious service: Not on file    Active member of club or organization: Not on file    Attends meetings of clubs or organizations: Not on file    Relationship status: Not on file  . Intimate partner violence:    Fear of current or ex partner: Not on file    Emotionally abused: Not on file    Physically abused: Not on file    Forced sexual activity: Not on file  Other Topics Concern  . Not on file  Social History Narrative   Lives alone in [place], [place]    Family History  Problem Relation Age of Onset  . Kidney failure Mother   . Diabetes Mother   . Hypertension Mother   . Heart disease Mother   . Glaucoma Father   . Congestive Heart Failure Sister   . Diabetes Sister   . Kidney disease Sister   . Diabetes Brother   . Unexplained death Brother 35       Automobile accident  . Hypothyroidism Daughter   . Arthritis Daughter        Neck/Back  . Healthy Son   . HIV/AIDS Brother   . HIV Daughter   . Kidney disease Daughter   . Arthritis Son        Knee    Assessment & Plan:   See Encounters Tab for problem based charting.  Patient discussed with Dr. Daryll Drown

## 2017-10-01 NOTE — Progress Notes (Signed)
Internal Medicine Clinic Attending  Case discussed with Dr. Winfrey  at the time of the visit.  We reviewed the resident's history and exam and pertinent patient test results.  I agree with the assessment, diagnosis, and plan of care documented in the resident's note.  

## 2017-10-01 NOTE — Telephone Encounter (Signed)
Attempts to call patient x 2.  Unable to reach to inform her that her support hose had arrived.  Support were mailed to patient today.  Sander Nephew, RN 09/28/2017 1215 PM.

## 2017-10-02 ENCOUNTER — Ambulatory Visit (INDEPENDENT_AMBULATORY_CARE_PROVIDER_SITE_OTHER): Payer: Medicare HMO | Admitting: Infectious Disease

## 2017-10-02 VITALS — BP 136/63 | HR 46 | Temp 97.5°F | Wt 128.0 lb

## 2017-10-02 DIAGNOSIS — F17200 Nicotine dependence, unspecified, uncomplicated: Secondary | ICD-10-CM

## 2017-10-02 DIAGNOSIS — I10 Essential (primary) hypertension: Secondary | ICD-10-CM | POA: Diagnosis not present

## 2017-10-02 DIAGNOSIS — Z79899 Other long term (current) drug therapy: Secondary | ICD-10-CM | POA: Diagnosis not present

## 2017-10-02 DIAGNOSIS — R600 Localized edema: Secondary | ICD-10-CM

## 2017-10-02 DIAGNOSIS — B20 Human immunodeficiency virus [HIV] disease: Secondary | ICD-10-CM | POA: Diagnosis not present

## 2017-10-02 DIAGNOSIS — R69 Illness, unspecified: Secondary | ICD-10-CM | POA: Diagnosis not present

## 2017-10-02 DIAGNOSIS — Z113 Encounter for screening for infections with a predominantly sexual mode of transmission: Secondary | ICD-10-CM

## 2017-10-02 MED ORDER — BICTEGRAVIR-EMTRICITAB-TENOFOV 50-200-25 MG PO TABS: 1.0000 | ORAL_TABLET | Freq: Every day | ORAL | 11 refills | Status: DC

## 2017-10-02 NOTE — Progress Notes (Signed)
Chief complaint: Follow-up for HIV on medication  Subjective:    Patient ID: Jessica [name], female    DOB: 1940-09-22, 77 y.o.   MRN: [id number]  HPI  Jessica [name] is a 77 y.o. female with HIV infection who is doing superbly well on her antiviral regimen, changed from Atripla to [facility] and DESCOVY -->BIKTARVY with reasonable virological suppression.  Lab Results  Component Value Date   HIV1RNAQUANT 38 (H) 09/11/2017   HIV1RNAQUANT 63 (H) 05/11/2017   HIV1RNAQUANT <20 NOT DETECTED 03/19/2017   She has had LE edema being worked up by PCP. Amlodipine dose dropped and meds changed.    Past Medical History:  Diagnosis Date  . Acute renal failure (ARF) ([place]) 08/17/2017  . Anxiety 04/05/2012  . Bilateral sensorineural hearing loss 06/11/2014   Mild to moderate on the left side and slight to mild on the right side per audiometry 05/2014.  Hearing aides with possible masking of tinnitus recommended but patient wished to defer secondary to finances.  . Blood transfusion without reported diagnosis    pt denies  . Bursitis of right shoulder 07/12/2012   s/p shoulder injection 07/12/2012   . Cataract of right eye   . Constipation due to pain medication 04/27/2010  . Diverticulosis 02/08/2012   Extensive left-sided diverticula on colonoscopy March 2012 per Dr. Gala Romney   . Essential hypertension 07/20/2006  . Genital herpes 07/20/2006  . 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 Heart murmur 1961  . Human immunodeficiency virus disease ([place]) 03/27/1986  . Hyperlipidemia LDL goal < 100 04/05/2012  . Long-term current use of opiate analgesic 03/17/2016  . Lumbar degenerative disc disease 07/20/2006   With chronic back pain   . Marijuana use 07/03/2016  . Micturition syncope 09/20/2015  . 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[place]) 11/01/2011   s/p aortobifem bypass 2009   . Periumbilical hernia 08/13/9526   1 cm left periumbilical abdominal wall defect  . 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 Right rotator cuff tear 02/01/2013   Responds to periodic steroid injections  . Seborrhea 09/01/2010  . Small bowel obstruction due to adhesions ([place]) 02/08/2012   s/p Exploratory laparotomy, lysis of adhesions 02/12/12    . 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 Tobacco abuse 02/19/2012  . Tobacco abuse   . Vasovagal syncope 02/15/2015  . Voiding dysfunction    s/p cystoscopy and meatal dilation Dec 2005    Past Surgical History:  Procedure Laterality Date  . ABDOMINAL HYSTERECTOMY    . AORTO-FEMORAL BYPASS GRAFT  04/2007  . APPENDECTOMY    . BREAST SURGERY     Breast biopsy: negative  . CHOLECYSTECTOMY    . COLECTOMY  01/2011   Dr. Margot Chimes; "took out 12 inches of small intestiines and removed blockage"  . EYE SURGERY    . LAPAROTOMY  02/12/2012   Procedure: EXPLORATORY LAPAROTOMY;  Surgeon: Stark Klein, MD;  Location: [facility] OR;  Service: General;  Laterality: N/A;  Exploratory Laparotomy, lysis of adhesions  . SMALL INTESTINE SURGERY      Family History  Problem Relation Age of Onset  . Kidney failure Mother   . Diabetes Mother   . Hypertension Mother   . Heart disease Mother   . Glaucoma Father   . Congestive Heart Failure Sister   . Diabetes Sister   . Kidney disease Sister   . Diabetes Brother   . Unexplained death  Brother 24       Automobile accident  . Hypothyroidism Daughter   . Arthritis Daughter        Neck/Back  . Healthy Son   . HIV/AIDS Brother   . HIV Daughter   . Kidney disease Daughter   . Arthritis Son        Knee      Social History   Socioeconomic History  . Marital status: Widowed    Spouse name: Not on file  . Number of children: 4  . Years of education: 2y college  . Highest education level: Not on file  Occupational History  . Occupation: retired    Comment: previously worked as a Designer, fashion/clothing for [employer]  . Financial resource strain: Not on file  . Food  insecurity:    Worry: Not on file    Inability: Not on file  . Transportation needs:    Medical: Not on file    Non-medical: Not on file  Tobacco Use  . Smoking status: Current Every Day Smoker    Packs/day: 0.50    Years: 50.00    Pack years: 25.00    Types: Cigarettes  . Smokeless tobacco: Never Used  . Tobacco comment: smoking .5 PPD  Substance and Sexual Activity  . Alcohol use: No    Alcohol/week: 0.0 oz    Comment: "last drink of alcohol ~ 1977"  . Drug use: No    Comment: cutting back on chantix, 1 cigarette this morning  . Sexual activity: Never    Partners: Male  Lifestyle  . Physical activity:    Days per week: Not on file    Minutes per session: Not on file  . Stress: Not on file  Relationships  . Social connections:    Talks on phone: Not on file    Gets together: Not on file    Attends religious service: Not on file    Active member of club or organization: Not on file    Attends meetings of clubs or organizations: Not on file    Relationship status: Not on file  Other Topics Concern  . Not on file  Social History Narrative   Lives alone in [place], [place]    Allergies  Allergen Reactions  . Hctz [Hydrochlorothiazide] Other (See Comments)    Dizziness, syncope. Does not wish to take anymore     Current Outpatient Medications:  .  alendronate (FOSAMAX) 70 MG tablet, Take 1 tablet (70 mg total) by mouth once a week., Disp: 12 tablet, Rfl: 3 .  ALPRAZolam (XANAX) 0.5 MG tablet, Take 1 tablet (0.5 mg total) by mouth 3 (three) times daily as needed for anxiety., Disp: 80 tablet, Rfl: 5 .  amLODipine (NORVASC) 5 MG tablet, Take 1 tablet (5 mg total) by mouth daily., Disp: 90 tablet, Rfl: 3 .  aspirin 81 MG EC tablet, Take 81 mg by mouth daily.  , Disp: , Rfl:  .  atenolol (TENORMIN) 50 MG tablet, Take 1 tablet (50 mg total) by mouth daily., Disp: 90 tablet, Rfl: 3 .  atorvastatin (LIPITOR) 10 MG tablet, TAKE 1 TABLET BY MOUTH EVERY DAY (STOP LOVASTATIN), Disp: 30  tablet, Rfl: 11 .  baclofen (LIORESAL) 10 MG tablet, Take 10 mg by mouth 3 (three) times daily as needed. for muscle spams, Disp: , Rfl: 11 .  benazepril (LOTENSIN) 40 MG tablet, TAKE ONE TABLET BY MOUTH DAILY, Disp: 90 tablet, Rfl: 3 .  bictegravir-emtricitabine-tenofovir AF (BIKTARVY)  50-200-25 MG TABS tablet, Take 1 tablet by mouth daily., Disp: 30 tablet, Rfl: 11 .  calcium-vitamin D (OSCAL WITH D) 500-200 MG-UNIT per tablet, Take 1 tablet by mouth 2 (two) times daily., Disp: , Rfl:  .  COMBIGAN 0.2-0.5 % ophthalmic solution, Place 1 drop into the left eye 2 (two) times daily., Disp: , Rfl:  .  diclofenac sodium (VOLTAREN) 1 % GEL, Apply 2 g topically 4 (four) times daily., Disp: 100 g, Rfl: 3 .  docusate sodium (COLACE) 100 MG capsule, Take 100 mg by mouth 2 (two) times daily., Disp: , Rfl:  .  dorzolamide (TRUSOPT) 2 % ophthalmic solution, Place 1 drop into both eyes 2 (two) times daily., Disp: , Rfl: 5 .  hydrALAZINE (APRESOLINE) 50 MG tablet, Take 1 tablet (50 mg total) by mouth 3 (three) times daily., Disp: 270 tablet, Rfl: 0 .  [START ON 11/15/2017] HYDROcodone-acetaminophen (NORCO) 10-325 MG tablet, Take 1 tablet by mouth every 8 (eight) hours as needed for severe pain., Disp: 90 tablet, Rfl: 0 .  ibuprofen (ADVIL,MOTRIN) 200 MG tablet, Take 400 mg by mouth 3 (three) times daily as needed for pain., Disp: , Rfl:  .  latanoprost (XALATAN) 0.005 % ophthalmic solution, Place 1 drop into both eyes at bedtime., Disp: , Rfl:  .  Multiple Vitamin (MULTIVITAMIN WITH MINERALS) TABS, Take 1 tablet by mouth daily., Disp: , Rfl:  .  Naloxone HCl (EVZIO) 0.4 MG/0.4ML SOAJ, Inject 0.4 mg as directed once as needed. For overdose and dial 911. May give another dose if needed., Disp: 0.4 mL, Rfl: 0 .  nitrofurantoin, macrocrystal-monohydrate, (MACROBID) 100 MG capsule, Take 1 capsule (100 mg total) by mouth every 12 (twelve) hours., Disp: 10 capsule, Rfl: 0 .  pantoprazole (PROTONIX) 40 MG tablet, TAKE ONE  TABLET BY MOUTH EVERY DAY, Disp: 30 tablet, Rfl: 5 .  spironolactone (ALDACTONE) 100 MG tablet, Take 1 tablet (100 mg total) by mouth daily., Disp: 90 tablet, Rfl: 3 .  spironolactone (ALDACTONE) 25 MG tablet, Take 1 tablet (25 mg total) by mouth daily., Disp: 90 tablet, Rfl: 3 .  terazosin (HYTRIN) 5 MG capsule, Take 1 capsule (5 mg total) by mouth at bedtime., Disp: 90 capsule, Rfl: 3        Review of Systems  Constitutional: Negative for activity change, appetite change, chills, diaphoresis, fatigue and unexpected weight change.  HENT: Negative for congestion, ear pain, rhinorrhea, sinus pressure, sneezing, sore throat and trouble swallowing.   Eyes: Negative for photophobia and visual disturbance.  Respiratory: Negative for cough, chest tightness, shortness of breath, wheezing and stridor.   Cardiovascular: Positive for leg swelling. Negative for palpitations.  Gastrointestinal: Negative for abdominal distention, anal bleeding, blood in stool, constipation, diarrhea, nausea and vomiting.  Genitourinary: Negative for difficulty urinating, flank pain and hematuria.  Musculoskeletal: Negative for arthralgias, gait problem, joint swelling and myalgias.  Skin: Negative for color change, pallor and wound.  Neurological: Negative for dizziness, tremors and light-headedness.  Hematological: Negative for adenopathy. Does not bruise/bleed easily.  Psychiatric/Behavioral: Negative for agitation, behavioral problems, confusion, decreased concentration, dysphoric mood and sleep disturbance.        Objective:   Physical Exam  Constitutional: She is oriented to person, place, and time. She appears well-developed and well-nourished. No distress.  HENT:  Head: Normocephalic and atraumatic.  Mouth/Throat: No oropharyngeal exudate.  Eyes: Conjunctivae and EOM are normal. No scleral icterus.  Neck: Normal range of motion. Neck supple.  Cardiovascular: Normal rate and regular rhythm.  Pulmonary/Chest: Effort normal. No respiratory distress. She has no wheezes.  Abdominal: She exhibits no distension.  Musculoskeletal: She exhibits no edema or tenderness.  Neurological: She is alert and oriented to person, place, and time. She exhibits normal muscle tone. Coordination normal.  Skin: Skin is warm and dry. No rash noted. She is not diaphoretic. No erythema. No pallor.  Psychiatric: She has a normal mood and affect. Her behavior is normal. Judgment and thought content normal.  Nursing note and vitals reviewed.         Assessment & Plan:     HIV: Continue BIKTARVY  Renew SPAP RTC in 6 months  AVOID TAKING CALCIUm, mag, MVI at same time as HIV meds UNLESS she takes a full meal   HTN being managed by PCP  LE edema: should amlodipine be stopped altogether   Hyperlipidemia: Continue Lipitor   Smoking: counseled her to at least change to vaping instead of smoking conventional cigarettes as a possible route to stopping altogether

## 2017-10-08 ENCOUNTER — Encounter: Payer: Self-pay | Admitting: Infectious Disease

## 2017-10-10 ENCOUNTER — Telehealth: Payer: Self-pay

## 2017-10-10 NOTE — Telephone Encounter (Signed)
Pt states the doctor didn't change her medicvine like he said he was going to, called pharmacy, pharm had already prepared her pill packs for delivery when they rec'd the new scripts so they put the new scripts on file and sent the old doses since they were ready, informed the pharm they must correct this action asap, they are calling pt and explaining and sending new doses, triage informed pt

## 2017-10-10 NOTE — Telephone Encounter (Signed)
Needs to speak with a nurse about meds. Please call pt back.  

## 2017-10-10 NOTE — Telephone Encounter (Signed)
Thanks for your help with that

## 2017-10-16 DIAGNOSIS — H401133 Primary open-angle glaucoma, bilateral, severe stage: Secondary | ICD-10-CM | POA: Diagnosis not present

## 2017-10-19 ENCOUNTER — Encounter: Payer: Self-pay | Admitting: Internal Medicine

## 2017-10-19 ENCOUNTER — Ambulatory Visit (INDEPENDENT_AMBULATORY_CARE_PROVIDER_SITE_OTHER): Payer: Medicare HMO | Admitting: Internal Medicine

## 2017-10-19 ENCOUNTER — Other Ambulatory Visit: Payer: Self-pay

## 2017-10-19 DIAGNOSIS — I10 Essential (primary) hypertension: Secondary | ICD-10-CM

## 2017-10-19 DIAGNOSIS — Z79899 Other long term (current) drug therapy: Secondary | ICD-10-CM | POA: Diagnosis not present

## 2017-10-19 DIAGNOSIS — G4700 Insomnia, unspecified: Secondary | ICD-10-CM | POA: Diagnosis not present

## 2017-10-19 DIAGNOSIS — Z87440 Personal history of urinary (tract) infections: Secondary | ICD-10-CM | POA: Diagnosis not present

## 2017-10-19 DIAGNOSIS — R69 Illness, unspecified: Secondary | ICD-10-CM | POA: Diagnosis not present

## 2017-10-19 DIAGNOSIS — F411 Generalized anxiety disorder: Secondary | ICD-10-CM

## 2017-10-19 DIAGNOSIS — F1320 Sedative, hypnotic or anxiolytic dependence, uncomplicated: Secondary | ICD-10-CM

## 2017-10-19 MED ORDER — AMLODIPINE BESYLATE 5 MG PO TABS: 10.0000 mg | ORAL_TABLET | Freq: Every day | ORAL | 3 refills | Status: DC

## 2017-10-19 NOTE — Patient Instructions (Addendum)
[name],   It was a pleasure taking care of you here at the clinic today.  Your blood pressure is elevated today at 188/78 in part due to some of the medication adjustments we made 3 weeks ago.  Your heart rate is however normal.  The summary of my recommendation:  1.  I am increasing amlodipine from 5 mg to 10 mg daily 2.  Continue atenolol 50 mg daily 3.  Continue hydralazine 50 mg 3 times daily 4.  Continue spironolactone 125 mg daily  I am getting some blood work today to check your kidneys and potassium.  For your difficulty sleeping, I will go back on your previous Xanax dose.  You can take Xanax 0.5 mg during the day and increase to 1 mg during the night.  I will see you back here the clinic in 2 weeks to check your blood pressure.  Dr. Eileen Stanford

## 2017-10-19 NOTE — Assessment & Plan Note (Addendum)
Insomnia: Difficulty sleeping since Xanax was recently reduced.  She was recently admitted agrees for the hospital for UTI complicated with delirium.  Prior to admission she was taking Xanax 1 mg TID.  However at discharge Xanax was reduced from 1 mg TID to 0.5mg  TID.  I am concerned that patient might be the be withdrawing due to the drastic decrease in Xanax given her long term use. -Advised to take Xanax 0.5 mg during the day and increase to 1 mg at night  Addendum: Per discussion with Dr. Eppie Gibson and Dr. Daryll Drown, it would be unsafe at this point to increase Alprazolam dose based on history, I will call patient to inform her to disregard my previous recommendation of increasing the night time dose of Xanax to 1mg . If refills are needed, she will provided her regular Xanax 0.5 mg.

## 2017-10-19 NOTE — Progress Notes (Signed)
CC: BP Check, Medication check   HPI:  Jessica [name] is a 77 y.o. African-American woman with hypertension, history of low diastolic BPs and history of symptomatic bradycardia here for blood pressure check and medication adjustment.  She was seen at the clinic 3 weeks ago and was found to be bradycardic to 46.  During that visit, there were several medication adjustments as listed below: 1)Amlodipine 10mg  qd --> 5mg  qd 2)Atenolol 100mg  qd -->50mg  qd 3)Hydralazine 25mg  TID -->50mg  TID 4)Spironolactone 100mg  qd --> 125mg  qd  Today, she reports of being compliant with all medications and denies any complaints of chest pain, shortness of breath, headaches or lightheadedness.  She does however reports of trouble sleeping as her recent Xanax dose was decreased from 1 mg TID to 0.5 mg 3 times daily.  She was recently admitted at the hospital with a UTI complicated with delirium.  Hypertension: She was seen at the clinic 3 weeks ago and was found to be bradycardic to 46. During that visit, there were several medication adjustments as listed below: 1)Amlodipine 10mg  qd --> 5mg  qd 2)Atenolol 100mg  qd -->50mg  qd 3)Hydralazine 25mg  TID -->50mg  TID 4)Spironolactone 100mg  qd --> 125mg  qd  Today, blood pressure is elevated at 188/78 with normal heart rate of 69.  I will slowly titrate up her BP meds for optimal BP control given her history of bradycardia and low diastolic BPs --> I will increase amlodipine 5 mg daily to 10 mg daily -->Continue Atenolol 50mg  qd -->Continue Hydralazine 50mg  TID -->Continue Spironolactone 125mg  qd -->Obtain BMP -->Next Step Plan: RTC in 2 weeks for BP check and slowly titrate up meds if BP not within goal   Insomnia: Difficulty sleeping since Xanax was recently reduced.  She was recently admitted agrees for the hospital for UTI complicated with delirium.  Prior to admission she was taking Xanax 1 mg TID.  However at discharge Xanax was reduced from 1 mg TID  to 0.5mg  TID.  I am concerned that patient might be the be withdrawing due to the drastic decrease in Xanax given her long term use. -Advised to take Xanax 0.5 mg during the day and increase to 1 mg at night   Past Medical History:  Diagnosis Date  . Acute renal failure (ARF) ([place]) 08/17/2017  . Anxiety 04/05/2012  . Bilateral sensorineural hearing loss 06/11/2014   Mild to moderate on the left side and slight to mild on the right side per audiometry 05/2014.  Hearing aides with possible masking of tinnitus recommended but patient wished to defer secondary to finances.  . Blood transfusion without reported diagnosis    pt denies  . Bursitis of right shoulder 07/12/2012   s/p shoulder injection 07/12/2012   . Cataract of right eye   . Constipation due to pain medication 04/27/2010  . Diverticulosis 02/08/2012   Extensive left-sided diverticula on colonoscopy March 2012 per Dr. Gala Romney   . Essential hypertension 07/20/2006  . Genital herpes 07/20/2006  . 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 Heart murmur 1961  . Human immunodeficiency virus disease ([place]) 03/27/1986  . Hyperlipidemia LDL goal < 100 04/05/2012  . Long-term current use of opiate analgesic 03/17/2016  . Lumbar degenerative disc disease 07/20/2006   With chronic back pain   . Marijuana use 07/03/2016  . Micturition syncope 09/20/2015  . 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[place]) 11/01/2011   s/p aortobifem bypass 2009   . Periumbilical hernia 5/0/9326   1 cm left periumbilical abdominal wall defect  .  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 Right rotator cuff tear 02/01/2013   Responds to periodic steroid injections  . Seborrhea 09/01/2010  . Small bowel obstruction due to adhesions ([place]) 02/08/2012   s/p Exploratory laparotomy, lysis of adhesions 02/12/12    . 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 Tobacco abuse 02/19/2012  . Tobacco abuse   . Vasovagal syncope 02/15/2015  . Voiding  dysfunction    s/p cystoscopy and meatal dilation Dec 2005   Review of Systems:  As per HPI  Physical Exam:  Vitals:   10/19/17 1006  BP: (!) 188/78  Pulse: 69  Temp: 98.6 F (37 C)  TempSrc: Oral  SpO2: 97%  Weight: 131 lb 6.4 oz (59.6 kg)  Height: 5' 7.9" (1.725 m)   Constitutional: No acute distress, very pleasant lady Cardiovascular: Regular rate and rhythm, no murmurs, gallops, rubs Respiratory: Clear to auscultation with no apparent crackles Abdomen: Bowel sounds present, nontender to palpation Extremity: No lower extremity edema-much improved with stockings.  Assessment & Plan:   See Encounters Tab for problem based charting.  Patient discussed with Dr. Daryll Drown

## 2017-10-19 NOTE — Assessment & Plan Note (Signed)
Hypertension: She was seen at the clinic 3 weeks ago and was found to be bradycardic to 46. During that visit, there were several medication adjustments as listed below: 1)Amlodipine 10mg  qd --> 5mg  qd 2)Atenolol 100mg  qd -->50mg  qd 3)Hydralazine 25mg  TID -->50mg  TID 4)Spironolactone 100mg  qd --> 125mg  qd  Today, blood pressure is elevated at 188/78 with normal heart rate of 69.  I will slowly titrate up her BP meds for optimal BP control given her history of bradycardia and low diastolic BPs --> I will increase amlodipine 5 mg daily to 10 mg daily -->Continue Atenolol 50mg  qd -->Continue Hydralazine 50mg  TID -->Continue Spironolactone 125mg  qd -->Obtain BMP -->Next Step Plan: RTC in 2 weeks for BP check and slowly titrate up meds if BP not within goal

## 2017-10-20 ENCOUNTER — Telehealth: Payer: Self-pay | Admitting: Internal Medicine

## 2017-10-20 LAB — BMP8+ANION GAP
ANION GAP: 12 mmol/L (ref 10.0–18.0)
BUN/Creatinine Ratio: 19 (ref 12–28)
BUN: 14 mg/dL (ref 8–27)
CO2: 19 mmol/L — AB (ref 20–29)
Calcium: 9.6 mg/dL (ref 8.7–10.3)
Chloride: 111 mmol/L — ABNORMAL HIGH (ref 96–106)
Creatinine, Ser: 0.74 mg/dL (ref 0.57–1.00)
GFR calc Af Amer: 92 mL/min/{1.73_m2} (ref >59)
GFR calc non Af Amer: 80 mL/min/{1.73_m2} (ref >59)
Glucose: 82 mg/dL (ref 65–99)
POTASSIUM: 3.4 mmol/L — AB (ref 3.5–5.2)
SODIUM: 142 mmol/L (ref 134–144)

## 2017-10-20 NOTE — Assessment & Plan Note (Addendum)
Insomnia: Difficulty sleeping since Xanax was recently reduced.  She was recently admitted agrees for the hospital for UTI complicated with delirium.  Prior to admission she was taking Xanax 1 mg TID.  However at discharge Xanax was reduced from 1 mg TID to 0.5mg  TID.  I am concerned that patient might be the be withdrawing due to the drastic decrease in Xanax given her long term use. -Advised to take Xanax 0.5 mg during the day and increase to 1 mg at night  Addendum: see discussion under generalized anxiety disorder.

## 2017-10-20 NOTE — Telephone Encounter (Signed)
I called patient this evening and had a thorough discussion about Xanax regimen.  During our clinic visit she reported of difficulty sleeping due to recent decrease in Xanax from 1 mg to 0.5 mg.  However prior discussion with PCP I was made aware that patient's daughter had been abusing her mother's Xanax.   I encouraged patient to go back to her previous regimen of Xanax 0.5mg  and she agreed.   I also discussed her lab results with her.

## 2017-10-22 NOTE — Progress Notes (Signed)
Internal Medicine Clinic Attending  I saw and evaluated the patient.  I personally confirmed the key portions of the history and exam documented by Dr. Eileen Stanford and I reviewed pertinent patient test results.  The assessment, diagnosis, and plan were formulated together and I agree with the documentation in the resident's note.  After the visit, discussed patient's care with PCP Dr. Eppie Gibson.  Based on his assessment and knowledge of patient history, he feels it would be more appropriate to keep patient on Alprazolam 0.5mg  TID  PRN dosing, which we will do.  I asked Dr. Eileen Stanford to inform patient of this decision and to make an appointment with Dr. Eppie Gibson to discuss further.  Changes to her controlled substances should not be made in the [facility] Without first discussing with Dr. Eppie Gibson going forward.

## 2017-11-02 ENCOUNTER — Ambulatory Visit (INDEPENDENT_AMBULATORY_CARE_PROVIDER_SITE_OTHER): Payer: Medicare HMO | Admitting: Internal Medicine

## 2017-11-02 DIAGNOSIS — Z79899 Other long term (current) drug therapy: Secondary | ICD-10-CM | POA: Diagnosis not present

## 2017-11-02 DIAGNOSIS — I10 Essential (primary) hypertension: Secondary | ICD-10-CM | POA: Diagnosis not present

## 2017-11-02 MED ORDER — TERAZOSIN HCL 5 MG PO CAPS: 5.0000 mg | ORAL_CAPSULE | Freq: Every day | ORAL | 3 refills | Status: DC

## 2017-11-02 NOTE — Patient Instructions (Signed)
[name],  It was a pleasure taking care of you at the clinic today and I am glad to note that your blood pressure is well controlled today because of the adjustments I made to your medication 2 weeks ago.  Continue taking all your medications.  And as we discussed, you can get a blood pressure machine at [place], [facility] or [facility].  Continue monitoring your blood pressure and please keep a log of it.  ~Dr. Eileen Stanford

## 2017-11-02 NOTE — Progress Notes (Signed)
Medicine attending: I personally interviewed and briefly examined this patient on the day of the patient visit and reviewed pertinent clinical and laboratorydata  with resident physician Dr. Nathanial Rancher and we discussed a management plan. BP controlled on current meds. No other active issues today.

## 2017-11-02 NOTE — Progress Notes (Signed)
   CC: Follow up BP  HPI:  [name] is a 77 y.o. woman with past medical history as listed below presented today to follow-up on blood pressure.  See problem based assessment below for further details:  Essential hypertension: Her blood pressure is very well controlled today at 137/68 with pulse 58.  Currently she is on amlodipine 10 mg daily, atenolol 50 mg daily, hydralazine 50 mg TID, spironolactone 125 mg daily.  I will not make any adjustments to her medications at this point.  She was at the clinic today accompanied by her daughter and son and I advised them to purchase a blood pressure cuff to consistently measure BP at home and bring log to your next clinic visit.   Plan: Continue amlodipine 10 mg daily, atenolol 50 mg daily, hydralazine 50 mg TID, spironolactone 125 mg daily and terazosin 5 mg QHS    Past Medical History:  Diagnosis Date  . Acute renal failure (ARF) ([place]) 08/17/2017  . Anxiety 04/05/2012  . Bilateral sensorineural hearing loss 06/11/2014   Mild to moderate on the left side and slight to mild on the right side per audiometry 05/2014.  Hearing aides with possible masking of tinnitus recommended but patient wished to defer secondary to finances.  . Blood transfusion without reported diagnosis    pt denies  . Bursitis of right shoulder 07/12/2012   s/p shoulder injection 07/12/2012   . Cataract of right eye   . Constipation due to pain medication 04/27/2010  . Diverticulosis 02/08/2012   Extensive left-sided diverticula on colonoscopy March 2012 per Dr. Gala Romney   . Essential hypertension 07/20/2006  . Genital herpes 07/20/2006  . 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 Heart murmur 1961  . Human immunodeficiency virus disease ([place]) 03/27/1986  . Hyperlipidemia LDL goal < 100 04/05/2012  . Long-term current use of opiate analgesic 03/17/2016  . Lumbar degenerative disc disease 07/20/2006   With chronic back pain   . Marijuana use 07/03/2016  . Micturition syncope  09/20/2015  . 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[place]) 11/01/2011   s/p aortobifem bypass 2009   . Periumbilical hernia 11/18/9890   1 cm left periumbilical abdominal wall defect  . 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 Right rotator cuff tear 02/01/2013   Responds to periodic steroid injections  . Seborrhea 09/01/2010  . Small bowel obstruction due to adhesions ([place]) 02/08/2012   s/p Exploratory laparotomy, lysis of adhesions 02/12/12    . 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 Tobacco abuse 02/19/2012  . Tobacco abuse   . Vasovagal syncope 02/15/2015  . Voiding dysfunction    s/p cystoscopy and meatal dilation Dec 2005   Review of Systems: As per HPI  Physical Exam:  Vitals:   11/02/17 1031  BP: 137/68  Pulse: (!) 58  Temp: 98 F (36.7 C)  TempSrc: Oral  SpO2: (!) 58%  Weight: 135 lb 6.4 oz (61.4 kg)   Constitutional: In no distress Cardiovascular: Normal S1 and S2.  No murmurs, gallops, rubs Respiratory: Clear to auscultation bilaterally.  No wheezes, crackles, rhonchi Abdomen: Bowel sounds present, soft, nondistended, nontender to palpation  Assessment & Plan:   See Encounters Tab for problem based charting.  Patient discussed with Dr. Beryle Beams

## 2017-11-02 NOTE — Assessment & Plan Note (Signed)
BP Readings from Last 3 Encounters:  11/02/17 137/68  10/19/17 (!) 188/78  10/02/17 136/63   Her blood pressure is very well controlled today at 137/68 with pulse 58.  Currently she is on amlodipine 10 mg daily, atenolol 50 mg daily, hydralazine 50 mg TID, spironolactone 125 mg daily.  I will not make any adjustments to her medications at this point.  She was at the clinic today accompanied by her daughter and son and I advised them to purchase a blood pressure cuff to consistently measure BP at home and bring log to your next clinic visit.   Plan: Continue amlodipine 10 mg daily, atenolol 50 mg daily, hydralazine 50 mg TID, spironolactone 125 mg daily and terazosin 5 mg QHS

## 2017-11-12 ENCOUNTER — Other Ambulatory Visit: Payer: Self-pay

## 2017-11-12 ENCOUNTER — Encounter (HOSPITAL_COMMUNITY): Payer: Self-pay | Admitting: Emergency Medicine

## 2017-11-12 ENCOUNTER — Inpatient Hospital Stay (HOSPITAL_COMMUNITY)
Admission: EM | Admit: 2017-11-12 | Discharge: 2017-11-14 | DRG: 682 | Disposition: A | Discharge status: Home Health | Payer: Medicare HMO | Attending: Internal Medicine | Admitting: Internal Medicine

## 2017-11-12 ENCOUNTER — Emergency Department (HOSPITAL_COMMUNITY): Payer: Medicare HMO

## 2017-11-12 ENCOUNTER — Inpatient Hospital Stay (HOSPITAL_COMMUNITY): Payer: Medicare HMO

## 2017-11-12 DIAGNOSIS — N1330 Unspecified hydronephrosis: Secondary | ICD-10-CM | POA: Diagnosis not present

## 2017-11-12 DIAGNOSIS — N179 Acute kidney failure, unspecified: Secondary | ICD-10-CM | POA: Diagnosis not present

## 2017-11-12 DIAGNOSIS — S79911A Unspecified injury of right hip, initial encounter: Secondary | ICD-10-CM | POA: Diagnosis not present

## 2017-11-12 DIAGNOSIS — G9341 Metabolic encephalopathy: Secondary | ICD-10-CM | POA: Diagnosis present

## 2017-11-12 DIAGNOSIS — Z7983 Long term (current) use of bisphosphonates: Secondary | ICD-10-CM | POA: Diagnosis not present

## 2017-11-12 DIAGNOSIS — Z9049 Acquired absence of other specified parts of digestive tract: Secondary | ICD-10-CM | POA: Diagnosis not present

## 2017-11-12 DIAGNOSIS — F411 Generalized anxiety disorder: Secondary | ICD-10-CM

## 2017-11-12 DIAGNOSIS — M79651 Pain in right thigh: Secondary | ICD-10-CM | POA: Diagnosis present

## 2017-11-12 DIAGNOSIS — Z841 Family history of disorders of kidney and ureter: Secondary | ICD-10-CM

## 2017-11-12 DIAGNOSIS — G929 Unspecified toxic encephalopathy: Secondary | ICD-10-CM

## 2017-11-12 DIAGNOSIS — N189 Chronic kidney disease, unspecified: Secondary | ICD-10-CM | POA: Diagnosis not present

## 2017-11-12 DIAGNOSIS — G92 Toxic encephalopathy: Secondary | ICD-10-CM

## 2017-11-12 DIAGNOSIS — S299XXA Unspecified injury of thorax, initial encounter: Secondary | ICD-10-CM | POA: Diagnosis not present

## 2017-11-12 DIAGNOSIS — F1320 Sedative, hypnotic or anxiolytic dependence, uncomplicated: Secondary | ICD-10-CM | POA: Diagnosis present

## 2017-11-12 DIAGNOSIS — Z830 Family history of human immunodeficiency virus [HIV] disease: Secondary | ICD-10-CM

## 2017-11-12 DIAGNOSIS — S0990XA Unspecified injury of head, initial encounter: Secondary | ICD-10-CM | POA: Diagnosis not present

## 2017-11-12 DIAGNOSIS — F419 Anxiety disorder, unspecified: Secondary | ICD-10-CM | POA: Diagnosis present

## 2017-11-12 DIAGNOSIS — E538 Deficiency of other specified B group vitamins: Secondary | ICD-10-CM | POA: Diagnosis not present

## 2017-11-12 DIAGNOSIS — I959 Hypotension, unspecified: Secondary | ICD-10-CM | POA: Diagnosis present

## 2017-11-12 DIAGNOSIS — F1120 Opioid dependence, uncomplicated: Secondary | ICD-10-CM | POA: Diagnosis present

## 2017-11-12 DIAGNOSIS — R69 Illness, unspecified: Secondary | ICD-10-CM | POA: Diagnosis not present

## 2017-11-12 DIAGNOSIS — H903 Sensorineural hearing loss, bilateral: Secondary | ICD-10-CM | POA: Diagnosis present

## 2017-11-12 DIAGNOSIS — I129 Hypertensive chronic kidney disease with stage 1 through stage 4 chronic kidney disease, or unspecified chronic kidney disease: Secondary | ICD-10-CM | POA: Diagnosis not present

## 2017-11-12 DIAGNOSIS — Z79899 Other long term (current) drug therapy: Secondary | ICD-10-CM

## 2017-11-12 DIAGNOSIS — Z791 Long term (current) use of non-steroidal anti-inflammatories (NSAID): Secondary | ICD-10-CM | POA: Diagnosis not present

## 2017-11-12 DIAGNOSIS — B20 Human immunodeficiency virus [HIV] disease: Secondary | ICD-10-CM | POA: Diagnosis present

## 2017-11-12 DIAGNOSIS — H409 Unspecified glaucoma: Secondary | ICD-10-CM | POA: Diagnosis not present

## 2017-11-12 DIAGNOSIS — F17210 Nicotine dependence, cigarettes, uncomplicated: Secondary | ICD-10-CM | POA: Diagnosis present

## 2017-11-12 DIAGNOSIS — R410 Disorientation, unspecified: Secondary | ICD-10-CM | POA: Diagnosis not present

## 2017-11-12 DIAGNOSIS — E875 Hyperkalemia: Secondary | ICD-10-CM | POA: Diagnosis present

## 2017-11-12 DIAGNOSIS — N182 Chronic kidney disease, stage 2 (mild): Secondary | ICD-10-CM | POA: Diagnosis present

## 2017-11-12 DIAGNOSIS — Z90710 Acquired absence of both cervix and uterus: Secondary | ICD-10-CM

## 2017-11-12 DIAGNOSIS — R32 Unspecified urinary incontinence: Secondary | ICD-10-CM | POA: Diagnosis present

## 2017-11-12 DIAGNOSIS — W06XXXA Fall from bed, initial encounter: Secondary | ICD-10-CM | POA: Diagnosis not present

## 2017-11-12 DIAGNOSIS — Z7982 Long term (current) use of aspirin: Secondary | ICD-10-CM

## 2017-11-12 DIAGNOSIS — G8929 Other chronic pain: Secondary | ICD-10-CM | POA: Diagnosis present

## 2017-11-12 DIAGNOSIS — R339 Retention of urine, unspecified: Secondary | ICD-10-CM | POA: Diagnosis not present

## 2017-11-12 DIAGNOSIS — M79652 Pain in left thigh: Secondary | ICD-10-CM | POA: Diagnosis present

## 2017-11-12 DIAGNOSIS — I739 Peripheral vascular disease, unspecified: Secondary | ICD-10-CM | POA: Diagnosis not present

## 2017-11-12 DIAGNOSIS — Z83511 Family history of glaucoma: Secondary | ICD-10-CM

## 2017-11-12 DIAGNOSIS — M25559 Pain in unspecified hip: Secondary | ICD-10-CM | POA: Diagnosis not present

## 2017-11-12 DIAGNOSIS — M549 Dorsalgia, unspecified: Secondary | ICD-10-CM | POA: Diagnosis not present

## 2017-11-12 DIAGNOSIS — M7551 Bursitis of right shoulder: Secondary | ICD-10-CM | POA: Diagnosis present

## 2017-11-12 DIAGNOSIS — M810 Age-related osteoporosis without current pathological fracture: Secondary | ICD-10-CM | POA: Diagnosis present

## 2017-11-12 DIAGNOSIS — R011 Cardiac murmur, unspecified: Secondary | ICD-10-CM | POA: Diagnosis present

## 2017-11-12 DIAGNOSIS — Z833 Family history of diabetes mellitus: Secondary | ICD-10-CM

## 2017-11-12 DIAGNOSIS — R4182 Altered mental status, unspecified: Secondary | ICD-10-CM | POA: Diagnosis not present

## 2017-11-12 DIAGNOSIS — B009 Herpesviral infection, unspecified: Secondary | ICD-10-CM | POA: Diagnosis not present

## 2017-11-12 DIAGNOSIS — E785 Hyperlipidemia, unspecified: Secondary | ICD-10-CM | POA: Diagnosis present

## 2017-11-12 DIAGNOSIS — I10 Essential (primary) hypertension: Secondary | ICD-10-CM | POA: Diagnosis present

## 2017-11-12 DIAGNOSIS — Z8249 Family history of ischemic heart disease and other diseases of the circulatory system: Secondary | ICD-10-CM

## 2017-11-12 DIAGNOSIS — S0083XA Contusion of other part of head, initial encounter: Secondary | ICD-10-CM | POA: Diagnosis present

## 2017-11-12 DIAGNOSIS — I1310 Hypertensive heart and chronic kidney disease without heart failure, with stage 1 through stage 4 chronic kidney disease, or unspecified chronic kidney disease: Secondary | ICD-10-CM | POA: Diagnosis present

## 2017-11-12 LAB — URINALYSIS, ROUTINE W REFLEX MICROSCOPIC
Bilirubin Urine: NEGATIVE
GLUCOSE, UA: NEGATIVE mg/dL
KETONES UR: NEGATIVE mg/dL
Leukocytes, UA: NEGATIVE
NITRITE: NEGATIVE
PROTEIN: NEGATIVE mg/dL
SPECIFIC GRAVITY, URINE: 1.014 (ref 1.005–1.030)
pH: 5 (ref 5.0–8.0)

## 2017-11-12 LAB — RAPID URINE DRUG SCREEN, HOSP PERFORMED
Amphetamines: NOT DETECTED
BARBITURATES: NOT DETECTED
Benzodiazepines: POSITIVE — AB
Cocaine: NOT DETECTED
Opiates: POSITIVE — AB
Tetrahydrocannabinol: NOT DETECTED

## 2017-11-12 LAB — COMPREHENSIVE METABOLIC PANEL
ALK PHOS: 51 U/L (ref 38–126)
ALT: 29 U/L (ref 0–44)
AST: 57 U/L — AB (ref 15–41)
Albumin: 3.9 g/dL (ref 3.5–5.0)
Anion gap: 14 (ref 5–15)
BILIRUBIN TOTAL: 0.9 mg/dL (ref 0.3–1.2)
BUN: 74 mg/dL — AB (ref 8–23)
CALCIUM: 9.3 mg/dL (ref 8.9–10.3)
CO2: 16 mmol/L — ABNORMAL LOW (ref 22–32)
CREATININE: 4.8 mg/dL — AB (ref 0.44–1.00)
Chloride: 107 mmol/L (ref 98–111)
GFR calc Af Amer: 9 mL/min — ABNORMAL LOW (ref >60)
GFR, EST NON AFRICAN AMERICAN: 8 mL/min — AB (ref >60)
Glucose, Bld: 101 mg/dL — ABNORMAL HIGH (ref 70–99)
POTASSIUM: 5.2 mmol/L — AB (ref 3.5–5.1)
Sodium: 137 mmol/L (ref 135–145)
TOTAL PROTEIN: 7.7 g/dL (ref 6.5–8.1)

## 2017-11-12 LAB — I-STAT CG4 LACTIC ACID, ED: Lactic Acid, Venous: 1.02 mmol/L (ref 0.5–1.9)

## 2017-11-12 LAB — CBC WITH DIFFERENTIAL/PLATELET
Abs Immature Granulocytes: 0 10*3/uL (ref 0.0–0.1)
BASOS ABS: 0 10*3/uL (ref 0.0–0.1)
Basophils Relative: 1 %
EOS ABS: 0.2 10*3/uL (ref 0.0–0.7)
EOS PCT: 3 %
HCT: 41.9 % (ref 36.0–46.0)
Hemoglobin: 13.2 g/dL (ref 12.0–15.0)
Immature Granulocytes: 1 %
Lymphocytes Relative: 40 %
Lymphs Abs: 3.3 10*3/uL (ref 0.7–4.0)
MCH: 32.8 pg (ref 26.0–34.0)
MCHC: 31.5 g/dL (ref 30.0–36.0)
MCV: 104 fL — ABNORMAL HIGH (ref 78.0–100.0)
Monocytes Absolute: 0.6 10*3/uL (ref 0.1–1.0)
Monocytes Relative: 7 %
Neutro Abs: 4 10*3/uL (ref 1.7–7.7)
Neutrophils Relative %: 48 %
Platelets: 267 10*3/uL (ref 150–400)
RBC: 4.03 MIL/uL (ref 3.87–5.11)
RDW: 13.8 % (ref 11.5–15.5)
WBC: 8.2 10*3/uL (ref 4.0–10.5)

## 2017-11-12 LAB — ETHANOL

## 2017-11-12 LAB — CK: CK TOTAL: 2449 U/L — AB (ref 38–234)

## 2017-11-12 LAB — CBG MONITORING, ED: GLUCOSE-CAPILLARY: 106 mg/dL — AB (ref 70–99)

## 2017-11-12 LAB — TROPONIN I

## 2017-11-12 LAB — AMMONIA: Ammonia: 12 umol/L (ref 9–35)

## 2017-11-12 MED ORDER — NICOTINE 14 MG/24HR TD PT24
14.0000 mg | MEDICATED_PATCH | Freq: Every day | TRANSDERMAL | Status: DC
  Administered 2017-11-14 (×2): 14 mg via TRANSDERMAL
  Filled 2017-11-12 (×3): qty 1

## 2017-11-12 MED ORDER — SODIUM CHLORIDE 0.9 % IV BOLUS
250.0000 mL | Freq: Once | INTRAVENOUS | Status: AC
  Administered 2017-11-12: 250 mL via INTRAVENOUS

## 2017-11-12 MED ORDER — SODIUM CHLORIDE 0.9 % IV SOLN
INTRAVENOUS | Status: AC
  Administered 2017-11-13: 02:00:00 via INTRAVENOUS

## 2017-11-12 MED ORDER — ACETAMINOPHEN 650 MG RE SUPP: 650.0000 mg | Freq: Four times a day (QID) | RECTAL | Status: DC | PRN

## 2017-11-12 MED ORDER — ALPRAZOLAM 0.5 MG PO TABS
0.5000 mg | ORAL_TABLET | Freq: Three times a day (TID) | ORAL | Status: DC | PRN
  Administered 2017-11-13 – 2017-11-14 (×2): 0.5 mg via ORAL
  Filled 2017-11-12 (×2): qty 1

## 2017-11-12 MED ORDER — ACETAMINOPHEN 325 MG PO TABS
650.0000 mg | ORAL_TABLET | Freq: Four times a day (QID) | ORAL | Status: DC | PRN
  Administered 2017-11-14: 650 mg via ORAL
  Filled 2017-11-12: qty 2

## 2017-11-12 MED ORDER — ASPIRIN EC 81 MG PO TBEC
81.0000 mg | DELAYED_RELEASE_TABLET | Freq: Every day | ORAL | Status: DC
  Administered 2017-11-13 – 2017-11-14 (×2): 81 mg via ORAL
  Filled 2017-11-12 (×2): qty 1

## 2017-11-12 MED ORDER — HYDROCODONE-ACETAMINOPHEN 10-325 MG PO TABS: 1.0000 | ORAL_TABLET | Freq: Three times a day (TID) | ORAL | Status: DC | PRN

## 2017-11-12 MED ORDER — DEXTROSE 50 % IV SOLN: 1.0000 | Freq: Once | INTRAVENOUS | Status: DC

## 2017-11-12 MED ORDER — POLYETHYLENE GLYCOL 3350 17 G PO PACK: 17.0000 g | PACK | Freq: Every day | ORAL | Status: DC | PRN

## 2017-11-12 MED ORDER — BICTEGRAVIR-EMTRICITAB-TENOFOV 50-200-25 MG PO TABS
1.0000 | ORAL_TABLET | Freq: Every day | ORAL | Status: DC
  Administered 2017-11-13 – 2017-11-14 (×2): 1 via ORAL
  Filled 2017-11-12 (×2): qty 1

## 2017-11-12 MED ORDER — SODIUM CHLORIDE 0.9% FLUSH: 3.0000 mL | Freq: Two times a day (BID) | INTRAVENOUS | Status: DC

## 2017-11-12 MED ORDER — BRIMONIDINE TARTRATE 0.2 % OP SOLN
1.0000 [drp] | Freq: Two times a day (BID) | OPHTHALMIC | Status: DC
  Administered 2017-11-13 – 2017-11-14 (×3): 1 [drp] via OPHTHALMIC
  Filled 2017-11-12: qty 5

## 2017-11-12 MED ORDER — PANTOPRAZOLE SODIUM 40 MG PO TBEC
40.0000 mg | DELAYED_RELEASE_TABLET | Freq: Every day | ORAL | Status: DC
  Administered 2017-11-13 – 2017-11-14 (×2): 40 mg via ORAL
  Filled 2017-11-12 (×2): qty 1

## 2017-11-12 MED ORDER — TIMOLOL MALEATE 0.5 % OP SOLN
1.0000 [drp] | Freq: Two times a day (BID) | OPHTHALMIC | Status: DC
  Administered 2017-11-13 – 2017-11-14 (×3): 1 [drp] via OPHTHALMIC
  Filled 2017-11-12: qty 5

## 2017-11-12 MED ORDER — SODIUM CHLORIDE 0.9 % IV BOLUS
1000.0000 mL | Freq: Once | INTRAVENOUS | Status: AC
  Administered 2017-11-12: 1000 mL via INTRAVENOUS

## 2017-11-12 MED ORDER — SODIUM POLYSTYRENE SULFONATE 15 GM/60ML PO SUSP
15.0000 g | Freq: Once | ORAL | Status: DC
  Filled 2017-11-12: qty 60

## 2017-11-12 MED ORDER — LATANOPROST 0.005 % OP SOLN
1.0000 [drp] | Freq: Every day | OPHTHALMIC | Status: DC
  Administered 2017-11-14: 1 [drp] via OPHTHALMIC
  Filled 2017-11-12: qty 2.5

## 2017-11-12 MED ORDER — SODIUM CHLORIDE 0.9 % IV SOLN
1.0000 g | Freq: Once | INTRAVENOUS | Status: AC
  Administered 2017-11-12: 1 g via INTRAVENOUS
  Filled 2017-11-12 (×2): qty 10

## 2017-11-12 MED ORDER — HEPARIN SODIUM (PORCINE) 5000 UNIT/ML IJ SOLN
5000.0000 [IU] | Freq: Three times a day (TID) | INTRAMUSCULAR | Status: DC
  Administered 2017-11-13 – 2017-11-14 (×3): 5000 [IU] via SUBCUTANEOUS
  Filled 2017-11-12 (×3): qty 1

## 2017-11-12 MED ORDER — SODIUM CHLORIDE 0.9 % IV SOLN
1.0000 g | Freq: Once | INTRAVENOUS | Status: AC
  Administered 2017-11-12: 1 g via INTRAVENOUS
  Filled 2017-11-12: qty 10

## 2017-11-12 MED ORDER — DORZOLAMIDE HCL 2 % OP SOLN
1.0000 [drp] | Freq: Two times a day (BID) | OPHTHALMIC | Status: DC
  Administered 2017-11-13 – 2017-11-14 (×3): 1 [drp] via OPHTHALMIC
  Filled 2017-11-12: qty 10

## 2017-11-12 MED ORDER — BRIMONIDINE TARTRATE-TIMOLOL 0.2-0.5 % OP SOLN: 1.0000 [drp] | Freq: Two times a day (BID) | OPHTHALMIC | Status: DC

## 2017-11-12 NOTE — ED Notes (Signed)
Patient transported to CT 

## 2017-11-12 NOTE — H&P (Signed)
Date: 11/12/2017               Patient Name:  Jessica [name] MRN: [id number]  DOB: Jan 21, 1941 Age / Sex: 77 y.o., female   PCP: Oval Linsey, MD         Medical Service: Internal Medicine Teaching Service         Attending Physician: Dr. Bartholomew Crews, MD    First Contact: Dr. Truman Hayward Pager: [phone]  Second Contact: Dr. Trilby Drummer Pager: [phone]       After Hours (After 5p/  First Contact Pager: [phone]  weekends / holidays): Second Contact Pager: [phone]   Chief Complaint: altered mental status  History of Present Illness: Jessica [name] is a 77 yo female with a medical history of HIV (currently compliant on [place]; most recent CD4 1,700 and viral load 38 on 09/11/17), HTN, HLD, PVD s/p aortobifemoral bypass in 2009, chronic pain, opioid dependence, benzodiazepine dependence, tobacco use, and glaucoma who presented to the ED with her daughter for altered mental status. The patient lives with her daughter and at baseline is able to perform all ADLs and iADLs by herself. The daughter reports that for the past few nights, she has awoken to find her mother asleep on the floor thinking that she's in her bed. The daughter is unsure if the patient was getting out of bed purposefully or if she was falling out of bed. Each night the daughter would help her get back into bed and doesn't believe that she was on the floor for long periods of time. The daughter states that the patient's confusion got worse yesterday, but she has otherwise not noticed anything unusual like fevers, chills, new pain, nausea, vomiting, diarrhea, or urinary changes. She has had normal appetite and PO intake. She reports that this behavior is similar to when the patient was admitted in 08/2017 for UTI. The patient herself initially reports that she is feeling well, but later in the interview states that she "couldn't feel worse." She was alert and oriented to person and place, but not to year. She was able to follow  commands and answer questions, but she also made incongruous statements about losing her cigarette and being in a dream. She reported mild abdominal pain and bilateral thigh pain, but had no other specific complaints.   Of note, the patient was admitted for toxic encephalopathy in June of 2019. The source of her encephalopathy was not fully determined. DDx included UTI (urine culture grew E. Coli and VRE and she was treated with Macrodantin), THC use (UDS was THC positive), or opioid/benzodiazepine overdose. MRI brain without contrast was negative. At the time of discharge, the patient's fentanyl patch for chronic hip and back pain was discontinued and her Xanax dose was decreased from 1 mg TID to 0.5 mg TID. She continued on hydrocodone-acetaminophen 10-325 mg 1 tablet every 8 hours. She has also been taking ibuprofen 200 mg TID, but the daughter reports that she has been taking this for years. During this hospitalization, she was also treated for acute renal failure with a BUN and Cr of 25 and 1.56, respectively. Cr normalized down to 0.61 on discharge after IV fluid resuscitation. Furthermore, when seen in [facility] on 09/28/2017, patient was found to by bradycardic. To address this, her amlodipine and atenolol were decreased in dose and her hydralazine and spironolactone were increased in dose. Her BP (137/68) and HR were well-controlled in f/u on 11/02/17.   Upon arrival to the ED,  vitals were T 97.6, RR 22, HR 68, BP 65/42, and O2 sat 96%. Her BP increased to 121/52 without intervention. Labs were significant for K 5.2, bicarb 16, BUN 74, Cr 4.80, CK 2,449, lactate 1.02, ammonia 12, ethanol neg, and trop neg. UDS positive for opiates and benzodiazepines; negative for THC. UA showed small hemoglobin, negative nitrites, negative leukocytes, rare bacteria. EKG showed normal sinus rhythm with peaked T waves. CXR showed interstitial edema and cardiomegaly. R hip xray was negative for fracture. Head CT showed mild  microvascular disease, but no AICA. She received 1.25 L NS and 1g ceftriaxone.  Meds:  Current Meds  Medication Sig  . alendronate (FOSAMAX) 70 MG tablet Take 1 tablet (70 mg total) by mouth once a week. (Patient taking differently: Take 70 mg by mouth every Sunday. )  . ALPRAZolam (XANAX) 0.5 MG tablet Take 1 tablet (0.5 mg total) by mouth 3 (three) times daily as needed for anxiety. (Patient taking differently: Take 0.5 mg by mouth See admin instructions. Take 0.5 mg by mouth at bedtime and an additional 0.5 mg two times daily as needed for anxiety)  . amLODipine (NORVASC) 5 MG tablet Take 2 tablets (10 mg total) by mouth daily. (Patient taking differently: Take 5 mg by mouth 2 (two) times daily. )  . aspirin 81 MG EC tablet Take 81 mg by mouth daily.    Marland Kitchen atenolol (TENORMIN) 50 MG tablet Take 1 tablet (50 mg total) by mouth daily.  Marland Kitchen atorvastatin (LIPITOR) 10 MG tablet TAKE 1 TABLET BY MOUTH EVERY DAY (STOP LOVASTATIN)  . baclofen (LIORESAL) 10 MG tablet Take 10 mg by mouth 3 (three) times daily as needed for muscle spasms.   . benazepril (LOTENSIN) 40 MG tablet TAKE ONE TABLET BY MOUTH DAILY  . bictegravir-emtricitabine-tenofovir AF (BIKTARVY) 50-200-25 MG TABS tablet Take 1 tablet by mouth daily.  . calcium-vitamin D (OSCAL WITH D) 500-200 MG-UNIT per tablet Take 1 tablet by mouth 2 (two) times daily.  . COMBIGAN 0.2-0.5 % ophthalmic solution Place 1 drop into the left eye 2 (two) times daily.   . diclofenac sodium (VOLTAREN) 1 % GEL Apply 2 g topically 4 (four) times daily. (Patient taking differently: Apply 2 g topically See admin instructions. Apply 2 grams to painful areas of shoulders, arms, stomach, and thighs)  . docusate sodium (COLACE) 100 MG capsule Take 100 mg by mouth 2 (two) times daily.  . dorzolamide (TRUSOPT) 2 % ophthalmic solution Place 1 drop into both eyes 2 (two) times daily.  . hydrALAZINE (APRESOLINE) 50 MG tablet Take 1 tablet (50 mg total) by mouth 3 (three) times  daily.  Derrill Memo ON 11/15/2017] HYDROcodone-acetaminophen (NORCO) 10-325 MG tablet Take 1 tablet by mouth every 8 (eight) hours as needed for severe pain.  Marland Kitchen ibuprofen (ADVIL,MOTRIN) 200 MG tablet Take 200 mg by mouth 3 (three) times daily.   Marland Kitchen latanoprost (XALATAN) 0.005 % ophthalmic solution Place 1 drop into both eyes at bedtime.  . Multiple Vitamin (MULTIVITAMIN WITH MINERALS) TABS Take 1 tablet by mouth daily.  . Naloxone HCl (EVZIO) 0.4 MG/0.4ML SOAJ Inject 0.4 mg as directed once as needed. For overdose and dial 911. May give another dose if needed.  . pantoprazole (PROTONIX) 40 MG tablet TAKE ONE TABLET BY MOUTH EVERY DAY  . spironolactone (ALDACTONE) 100 MG tablet Take 1 tablet (100 mg total) by mouth daily.  Marland Kitchen terazosin (HYTRIN) 5 MG capsule Take 1 capsule (5 mg total) by mouth at bedtime.  Allergies: Allergies as of 11/12/2017 - Review Complete 11/12/2017  Allergen Reaction Noted  . Hctz [hydrochlorothiazide] Other (See Comments) 09/05/2013   Past Medical History:  Diagnosis Date  . Acute renal failure (ARF) ([place]) 08/17/2017  . Anxiety 04/05/2012  . Bilateral sensorineural hearing loss 06/11/2014   Mild to moderate on the left side and slight to mild on the right side per audiometry 05/2014.  Hearing aides with possible masking of tinnitus recommended but patient wished to defer secondary to finances.  . Blood transfusion without reported diagnosis    pt denies  . Bursitis of right shoulder 07/12/2012   s/p shoulder injection 07/12/2012   . Cataract of right eye   . Constipation due to pain medication 04/27/2010  . Diverticulosis 02/08/2012   Extensive left-sided diverticula on colonoscopy March 2012 per Dr. Gala Romney   . Essential hypertension 07/20/2006  . Genital herpes 07/20/2006  . 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 Heart murmur 1961  . Human immunodeficiency virus disease ([place]) 03/27/1986  . Hyperlipidemia LDL goal < 100 04/05/2012  . Long-term current use of opiate analgesic  03/17/2016  . Lumbar degenerative disc disease 07/20/2006   With chronic back pain   . Marijuana use 07/03/2016  . Micturition syncope 09/20/2015  . 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[place]) 11/01/2011   s/p aortobifem bypass 2009   . Periumbilical hernia 03/18/1094   1 cm left periumbilical abdominal wall defect  . 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 Right rotator cuff tear 02/01/2013   Responds to periodic steroid injections  . Seborrhea 09/01/2010  . Small bowel obstruction due to adhesions ([place]) 02/08/2012   s/p Exploratory laparotomy, lysis of adhesions 02/12/12    . 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 Tobacco abuse 02/19/2012  . Tobacco abuse   . Vasovagal syncope 02/15/2015  . Voiding dysfunction    s/p cystoscopy and meatal dilation Dec 2005    Family History: hx of kidney failure in mother, sister, and daughter  Social History: lives at home with daughter. Functionally independent. Baseline alert and oriented x4. Smokes 1/2 ppd. Occasionally smokes marijuana. Denies etoh or illicit drug use.  Review of Systems: A complete ROS was negative except as per HPI.   Physical Exam: Blood pressure (!) 139/54, pulse 70, temperature (!) 97.4 F (36.3 C), temperature source Oral, resp. rate 16, SpO2 97 %.  Constitutional: Well-developed, well-nourished, and in no distress. Alert and oriented to person and place, but not to year. Able to follow commands.  Eyes: Pupils are equal, round, and reactive to light. EOM are normal. HENT: Normocephalic. Small linear abrasion to nasal bridge consistent with injury from glasses. Otherwise atraumatic. Dry MM. Cardiovascular: Normal rate and regular rhythm. II/VI holosystolic murmur over right upper sternal border. No rubs or gallops. Pulmonary/Chest: Effort normal. Clear to auscultation bilaterally. No wheezes, rales, or rhonchi. Abdominal: Bowel sounds present. Soft, non-distended.  Mildly tender to palpation in the LLQ, suprapubic area, and RLQ. Large ex-lap scar from previous SBO. Ext: No lower extremity edema. Distal pulses intact. Neuro: Cranial nerves II-XII intact. Normal strength and sensation throughout. No asterixis.  Skin: Warm and dry. No rashes or wounds.  EKG: personally reviewed my interpretation is NSR with peaked T waves.  CXR: personally reviewed my interpretation is poor inspiration with interstitial edema.  Assessment & Plan by Problem: Jessica [name] is a 77 yo female with a medical history of HIV (currently compliant on Biktarvy;  most recent CD4 count 1,700 and viral load 38 on 09/11/17), HTN, HLD, PVD s/p aortobifemoral bypass in 2009, chronic pain, opioid dependence, benzodiazepine dependence, tobacco use, and glaucoma who presented to the hospital with altered mental status. She was afebrile and hypotensive to 65/42, but increased to 121/52 without intervention. Patient was confused on exam, but she was alert and able to answer questions somewhat appropriately and follow commands. Exam was also significant for mild lower abdominal tenderness. Initial labs were pertinent for hyperkalemia to 5.2, bicarb 16, BUN 74, Cr 4.80, CK 2,449, normal lactate, normal ammonia, ethanol neg, and trop neg. UDS positive for opiates and benzodiazepines. UA showed small hemoglobin, negative nitrites, negative leukocytes, rare bacteria. EKG showed normal sinus rhythm with peaked T waves. CXR showed interstitial edema and cardiomegaly. R hip xray was negative for fracture. Head CT showed mild microvascular disease, but no AICA. She received 1.25 L NS and 1g ceftriaxone.  Acute metabolic encephalopathy - Pt presents with confusion as noted by her daughter. Patient is alert, but confused on exam. Oriented to person and place, but not to year.  - Encephalopathy is likely secondary to urinary retention and hyperkalemia.  - Patient was hospitalized in 08/2017 for toxic encephalopathy.  DDx included UTI, THC use, opioid/benzodiazepine overdose/withdrawal. UA today is not suspicious for a UTI. UDS negative for THC today. Opioid and benzodiazepine doses were decreased after most recent hospitalization and patient had been lucid while on this regimen before a few days ago. Pt not tachycardic, hypertensive, or agitated. Workup in June included negative brain MRI, non-reactive RPR, Vitamin B12 wnl, and TSH wnl. EEG during admission showed right temporal epileptiform activity and neurology was consulted, but patient was not felt to be seizing and anti-epileptics were not started. - There was initial concern that patient may have hit her head while falling out of bed, however head CT was negative and patient has no symptoms concerning for concussion such as nausea Plan - Treat ARF and hyperkalemia as below. - F/u blood cultures and urine culture to rule out infection  Acute renal failure secondary to urinary retention Lower abdominal pain - When discharged from the hospital in June Cr was 0.7. Upon admission, Cr 4.80, BUN 74, bicarb 16, and K 5.2. Patient had a mild AKI during last admission in June, but this is much more pronounced. No urinary symptoms. Normal PO intake. CK level of 2,449 may be contributing to AKI, but is not high enough to explain her marked increased in Cr.  - Renal US showed extremely distended urinary bladder with mild right hydronephrosis. Her AKI is most likely caused by urinary retention. Her lower abdominal pain is likely due to a distended bladder. Retention may be a side effect of one of her medications, including Norco and Xanax. - Received 1.25 L NS in the ED Plan - PVR - Place Foley catheter - IVF NS 75 ml/hr - Daily BMP - Recheck CK in the am - Repeat EKG in the am - Strict I/Os - Hold home ibuprofen and benazepril  Hyperkalemia - K 5.2 on arrival. EKG showed peaked T waves consistent with hyperkalemia. Hyperkalemia is likely due to ARF.  Plan -  Calcium gluconate 1 g once - Kayexalate 15 g once - Daily BMP - Telemetry  Hypotension - Patient has had two documented low blood pressures of 65/42 and 99/49. Her other measurements have been normotensive with the most recent 139/54. This may be measurement error or a result of dehydration. The patient and  daughter report good PO intake, however the patient appears dry on exam and may have been eating/drinking less due to abdominal pain from bladder distension.  Plan - Hold home amlodipine, atenolol, benazepril, hydralazine  HIV - Currently compliant on Biktarvy; most recent CD4 count 1,700 and viral load 38 on 09/11/17 Plan - Continue Biktarvy  Chronic Hip and Thigh Pain - Continue home Norco 1 tablet q8 hours PRN.  Glaucoma - Continue glaucoma eye drops  Other home meds: continue home xanax and aspirin Tobacco use: nicotine patch Diet: renal DVT ppx: [place] heparin Bowel regimen: continue home protonix 40 mg QD and home miralax 17 g QD Code status: full code   Dispo: Admit patient to Inpatient with expected length of stay greater than 2 midnights.  Signed: Corinne Ports, MD 11/12/2017, 11:01 PM  Pager: [phone]

## 2017-11-12 NOTE — ED Notes (Signed)
Called lab to add on CK

## 2017-11-12 NOTE — ED Provider Notes (Addendum)
Patient placed in Quick Look pathway, seen and evaluated   Chief Complaint: AMS  HPI:   77 year old female presents with AMS. PMH significant for HIV with normal counts. Daughter is at bedside who provides history. She states that her mother has becoming increasingly altered over the past week. She has had an odor to her urine and has been incontinent which is not normal. She is concerned about UTI because symptoms are similar as to when she was admitted for the same in June. Urine culture grew out Vancomycin resistent E. Coli. She also has been falling out of bed every night and she has to help her back in the bed. Daughter is concerned about possible head injury as well. No fever, chills, cough, N/V, abdominal pain.  ROS: +AMS  Physical Exam:   Gen: Lethargic. Garbled speech. Responds to voice but is confused  Neuro: Confused  Skin: Warm, diaphoretic    Focused Exam: Heart: Regular rate and rhythm      Lungs: CTA    Abdomen: Soft, non-tender   Initiation of care has begun. The patient has been counseled on the process, plan, and necessity for staying for the completion/evaluation, and the remainder of the medical screening examination      Recardo Evangelist, PA-C 11/12/17 1619    Gareth Morgan, MD 11/14/17 [phone]

## 2017-11-12 NOTE — ED Triage Notes (Signed)
Patient presents with AMS onset yesterday. Family states they found her on the floor yesterday - patient thought she was in bed at the time. She is oriented to person, but has had progressively worsening confusion since last night. Family reports recent UTI.

## 2017-11-12 NOTE — ED Provider Notes (Signed)
[facility] EMERGENCY DEPARTMENT Provider Note   CSN: [id number] Arrival date & time: 11/12/17  1601     History   Chief Complaint Chief Complaint  Patient presents with  . Altered Mental Status    HPI Jessica [name] is a 77 y.o. female with a history of HIV (CD4 1700 & viral load 38 on 7/19), benzodiazepine dependence, long-term use of opioid analgesic, and essential HTN who presents to the emergency department by EMS with a chief complaint of altered mental status.  The patient lives at home with her daughter and at baseline is alert and oriented x4 and performs all of her activities of daily living.  The patient's daughter reports waxing and waning confusion over the last week that worsened yesterday.  She states that she has awoken several times over the last few nights to find her mother sitting in the middle of the floor.  She is uncertain if she had fallen, but states that she did not hear a crash.  She was unsure how long she had been in the floor prior to her finding her.  She reports that last night the patient had gone to bed when she heard a crash and went and found the patient in the bedroom floor.  She thinks that she might have hit her ahead as she has a bruise on her forehead.  The patient's daughter denies recent fever, chills, vomiting, malodorous urine, diarrhea, hematuria, or increased work of breathing.  The patient is laying in bed and holding her right thigh.  She is oriented to self.  The patient's daughter reports that she was admitted in June 2019 for similar symptoms and was found to have a UTI.  The patient's daughter reports she has been compliant with her home HIV medications and was last seen by Dr. Tommy Medal last month.  Recent medication changes include decrease in her amlodipine and atenolol doses, but an increase in her hydralazine and spironolactone.  The patient's daughter reports that the patient does not drink alcohol.    Level V caveat secondary to mental status change.   The history is provided by the patient. No language interpreter was used.    Past Medical History:  Diagnosis Date  . Acute renal failure (ARF) ([place]) 08/17/2017  . Anxiety 04/05/2012  . Bilateral sensorineural hearing loss 06/11/2014   Mild to moderate on the left side and slight to mild on the right side per audiometry 05/2014.  Hearing aides with possible masking of tinnitus recommended but patient wished to defer secondary to finances.  . Blood transfusion without reported diagnosis    pt denies  . Bursitis of right shoulder 07/12/2012   s/p shoulder injection 07/12/2012   . Cataract of right eye   . Constipation due to pain medication 04/27/2010  . Diverticulosis 02/08/2012   Extensive left-sided diverticula on colonoscopy March 2012 per Dr. Gala Romney   . Essential hypertension 07/20/2006  . Genital herpes 07/20/2006  . 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 Heart murmur 1961  . Human immunodeficiency virus disease ([place]) 03/27/1986  . Hyperlipidemia LDL goal < 100 04/05/2012  . Long-term current use of opiate analgesic 03/17/2016  . Lumbar degenerative disc disease 07/20/2006   With chronic back pain   . Marijuana use 07/03/2016  . Micturition syncope 09/20/2015  . 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[place]) 11/01/2011   s/p aortobifem bypass 2009   . Periumbilical hernia 2/0/2542   1 cm left periumbilical abdominal wall defect  .  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 Right rotator cuff tear 02/01/2013   Responds to periodic steroid injections  . Seborrhea 09/01/2010  . Small bowel obstruction due to adhesions ([place]) 02/08/2012   s/p Exploratory laparotomy, lysis of adhesions 02/12/12    . 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 Tobacco abuse 02/19/2012  . Tobacco abuse   . Vasovagal syncope 02/15/2015  . Voiding dysfunction    s/p cystoscopy and meatal dilation Dec 2005    Patient  Active Problem List   Diagnosis Date Noted  . Diastolic dysfunction 66/44/0347  . Bilateral lower extremity edema 09/11/2017  . Acute renal failure (ARF) ([place]) 08/17/2017  . Hypernatremia 08/17/2017  . Dehydration 08/17/2017  . Toxic encephalopathy 08/17/2017  . Benzodiazepine dependence ([place]) 08/17/2017  . Sinus bradycardia 08/17/2017  . Dysuria 08/09/2017  . Marijuana use 07/03/2016  . Long-term current use of opiate analgesic 03/17/2016  . Bilateral sensorineural hearing loss 06/11/2014  . 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 UTI (urinary tract infection) 06/20/2013  . Chronic sinusitis 04/02/2013  . Right rotator cuff tear 02/01/2013  . 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 Generalized anxiety disorder 04/05/2012  . Healthcare maintenance 04/05/2012  . Hyperlipidemia 04/05/2012  . Tobacco abuse 02/19/2012  . History of small bowel obstruction 02/08/2012  . Diverticulosis of colon without hemorrhage 02/08/2012  . 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[place]) 11/01/2011  . Seborrhea 09/01/2010  . Constipation due to pain medication 04/27/2010  . Genital herpes 07/20/2006  . 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 Essential hypertension 07/20/2006  . Lumbar degenerative disc disease 07/20/2006  . ABFND, PAP SMEAR, CERVIX/CERVICAL HPV NEC 07/20/2006  . Human immunodeficiency virus disease ([place]) 03/27/1986    Past Surgical History:  Procedure Laterality Date  . ABDOMINAL HYSTERECTOMY    . AORTO-FEMORAL BYPASS GRAFT  04/2007  . APPENDECTOMY    . BREAST SURGERY     Breast biopsy: negative  . CHOLECYSTECTOMY    . COLECTOMY  01/2011   Dr. Margot Chimes; "took out 12 inches of small intestiines and removed blockage"  . EYE SURGERY    . LAPAROTOMY  02/12/2012   Procedure: EXPLORATORY LAPAROTOMY;  Surgeon: Stark Klein, MD;  Location: [facility] OR;  Service: General;  Laterality: N/A;  Exploratory Laparotomy, lysis of adhesions  . SMALL INTESTINE SURGERY       OB History     Gravida  6   Para  4   Term  4   Preterm      AB  2   Living  4     SAB  2   TAB      Ectopic      Multiple      Live Births               Home Medications    Prior to Admission medications   Medication Sig Start Date End Date Taking? Authorizing Provider  alendronate (FOSAMAX) 70 MG tablet Take 1 tablet (70 mg total) by mouth once a week. Patient taking differently: Take 70 mg by mouth every Sunday.  07/02/17  Yes Oval Linsey, MD  ALPRAZolam Duanne Moron) 0.5 MG tablet Take 1 tablet (0.5 mg total) by mouth 3 (three) times daily as needed for anxiety. Patient taking differently: Take 0.5 mg by mouth See admin instructions. Take 0.5 mg by mouth at bedtime and an additional 0.5 mg two times daily as needed for anxiety 08/27/17  Yes Oval Linsey, MD  amLODipine (NORVASC) 5 MG tablet Take 2 tablets (10 mg total) by mouth daily. Patient taking differently: Take 5 mg by mouth 2 (two) times daily.  10/19/17  Yes Jean Rosenthal, MD  aspirin 81 MG EC tablet Take 81 mg by mouth daily.     Yes [provider]  atenolol (TENORMIN) 50 MG tablet Take 1 tablet (50 mg total) by mouth daily. 09/28/17  Yes Katherine Roan, MD  atorvastatin (LIPITOR) 10 MG tablet TAKE 1 TABLET BY MOUTH EVERY DAY (STOP LOVASTATIN) 03/29/17  Yes Tommy Medal, Lavell Islam, MD  baclofen (LIORESAL) 10 MG tablet Take 10 mg by mouth 3 (three) times daily as needed for muscle spasms.  08/03/17  Yes [provider]  benazepril (LOTENSIN) 40 MG tablet TAKE ONE TABLET BY MOUTH DAILY 07/19/17  Yes Annia Belt, MD  bictegravir-emtricitabine-tenofovir AF (BIKTARVY) 50-200-25 MG TABS tablet Take 1 tablet by mouth daily. 10/02/17  Yes Tommy Medal, Lavell Islam, MD  calcium-vitamin D (OSCAL WITH D) 500-200 MG-UNIT per tablet Take 1 tablet by mouth 2 (two) times daily.   Yes [provider]  COMBIGAN 0.2-0.5 % ophthalmic solution Place 1 drop into the left eye 2 (two) times daily.  01/06/13  Yes [provider]  diclofenac sodium (VOLTAREN) 1 % GEL Apply 2 g topically 4 (four) times daily. Patient taking differently: Apply 2 g topically See admin instructions. Apply 2 grams to painful areas of shoulders, arms, stomach, and thighs 08/09/17  Yes Oval Linsey, MD  docusate sodium (COLACE) 100 MG capsule Take 100 mg by mouth 2 (two) times daily.   Yes [provider]  dorzolamide (TRUSOPT) 2 % ophthalmic solution Place 1 drop into both eyes 2 (two) times daily. 08/03/17  Yes [provider]  hydrALAZINE (APRESOLINE) 50 MG tablet Take 1 tablet (50 mg total) by mouth 3 (three) times daily. 09/28/17  Yes Katherine Roan, MD  HYDROcodone-acetaminophen (NORCO) 10-325 MG tablet Take 1 tablet by mouth every 8 (eight) hours as needed for severe pain. 11/15/17  Yes Oval Linsey, MD  ibuprofen (ADVIL,MOTRIN) 200 MG tablet Take 200 mg by mouth 3 (three) times daily.    Yes [provider]  latanoprost (XALATAN) 0.005 % ophthalmic solution Place 1 drop into both eyes at bedtime. 08/16/17  Yes [provider]  Multiple Vitamin (MULTIVITAMIN WITH MINERALS) TABS Take 1 tablet by mouth daily.   Yes [provider]  Naloxone HCl (EVZIO) 0.4 MG/0.4ML SOAJ Inject 0.4 mg as directed once as needed. For overdose and dial 911. May give another dose if needed. 04/01/15  Yes Axel Filler, MD  pantoprazole (PROTONIX) 40 MG tablet TAKE ONE TABLET BY MOUTH EVERY DAY 09/18/17  Yes Tommy Medal, Lavell Islam, MD  spironolactone (ALDACTONE) 100 MG tablet Take 1 tablet (100 mg total) by mouth daily. 08/23/17  Yes Oval Linsey, MD  terazosin (HYTRIN) 5 MG capsule Take 1 capsule (5 mg total) by mouth at bedtime. 11/02/17  Yes Agyei, Caprice Kluver, MD  nitrofurantoin, macrocrystal-monohydrate, (MACROBID) 100 MG capsule Take 1 capsule (100 mg total) by mouth every 12 (twelve) hours. Patient not taking: Reported on 11/12/2017 08/22/17   Kathie Dike, MD  spironolactone (ALDACTONE) 25 MG  tablet Take 1 tablet (25 mg total) by mouth daily. Patient not taking: Reported on 11/12/2017 09/28/17   Katherine Roan, MD    Family History Family History  Problem Relation Age of Onset  . Kidney failure Mother   .  Diabetes Mother   . Hypertension Mother   . Heart disease Mother   . Glaucoma Father   . Congestive Heart Failure Sister   . Diabetes Sister   . Kidney disease Sister   . Diabetes Brother   . Unexplained death Brother 57       Automobile accident  . Hypothyroidism Daughter   . Arthritis Daughter        Neck/Back  . Healthy Son   . HIV/AIDS Brother   . HIV Daughter   . Kidney disease Daughter   . Arthritis Son        Knee    Social History Social History   Tobacco Use  . Smoking status: Current Every Day Smoker    Packs/day: 0.50    Years: 50.00    Pack years: 25.00    Types: Cigarettes  . Smokeless tobacco: Never Used  . Tobacco comment: smoking .5 PPD  Substance Use Topics  . Alcohol use: No    Alcohol/week: 0.0 standard drinks    Comment: "last drink of alcohol ~ 1977"  . Drug use: No    Comment: cutting back on chantix, 1 cigarette this morning     Allergies   Hctz [hydrochlorothiazide]   Review of Systems Review of Systems  Unable to perform ROS: Mental status change  Gastrointestinal: Positive for abdominal pain.  Musculoskeletal: Positive for arthralgias and gait problem.  Psychiatric/Behavioral: Positive for confusion.     Physical Exam Updated Vital Signs BP (!) 139/54 (BP Location: Left Arm)   Pulse 70   Temp (!) 97.4 F (36.3 C) (Oral)   Resp 16   SpO2 97%   Physical Exam  Constitutional: No distress.  HENT:  Head: Normocephalic.  Eyes: Pupils are equal, round, and reactive to light. Conjunctivae and EOM are normal.  Neck: Neck supple.  Cardiovascular: Normal rate, regular rhythm, normal heart sounds and intact distal pulses. Exam reveals no gallop and no friction rub.  No murmur heard. Pulmonary/Chest: Effort  normal. No stridor. No respiratory distress. She has no wheezes. She has no rales.  Abdominal: Soft. She exhibits no distension and no mass. There is tenderness. There is no rebound and no guarding. No hernia.  Mild tenderness to palpation to the bilateral lower quadrants without rebound or guarding.  No CVA tenderness bilaterally.  Neurological: She is alert.  Oriented to self.  She is unable to state the month, year, or current president.  She is able to recognize her daughter by name.  Intermittently follows commands.  She speaks in complete, fluent sentences, but responses may be inappropriate. Words with intermittent slurring.   Good strength against resistance of the large muscle groups of the bilateral upper extremities.  Spontaneously moves the bilateral arms, but refuses to move the bilateral legs.  Exam is limited as the patient is not following commands.  Skin: Skin is warm. No rash noted. She is not diaphoretic.  Psychiatric: Her behavior is normal.  Nursing note and vitals reviewed.   ED Treatments / Results  Labs (all labs ordered are listed, but only abnormal results are displayed) Labs Reviewed  COMPREHENSIVE METABOLIC PANEL - Abnormal; Notable for the following components:      Result Value   Potassium 5.2 (*)    CO2 16 (*)    Glucose, Bld 101 (*)    BUN 74 (*)    Creatinine, Ser 4.80 (*)    AST 57 (*)    GFR calc non Af Amer 8 (*)  GFR calc Af Amer 9 (*)    All other components within normal limits  CBC WITH DIFFERENTIAL/PLATELET - Abnormal; Notable for the following components:   MCV 104.0 (*)    All other components within normal limits  URINALYSIS, ROUTINE W REFLEX MICROSCOPIC - Abnormal; Notable for the following components:   APPearance HAZY (*)    Hgb urine dipstick SMALL (*)    Bacteria, UA RARE (*)    All other components within normal limits  RAPID URINE DRUG SCREEN, HOSP PERFORMED - Abnormal; Notable for the following components:   Opiates POSITIVE  (*)    Benzodiazepines POSITIVE (*)    All other components within normal limits  CK - Abnormal; Notable for the following components:   Total CK 2,449 (*)    All other components within normal limits  CBG MONITORING, ED - Abnormal; Notable for the following components:   Glucose-Capillary 106 (*)    All other components within normal limits  CULTURE, BLOOD (ROUTINE X 2)  CULTURE, BLOOD (ROUTINE X 2)  URINE CULTURE  AMMONIA  ETHANOL  TROPONIN I  BASIC METABOLIC PANEL  PHOSPHORUS  CK  I-STAT CG4 LACTIC ACID, ED  I-STAT CG4 LACTIC ACID, ED    EKG EKG Interpretation  Date/Time:  Monday November 12 2017 16:07:01 EDT Ventricular Rate:  69 PR Interval:  146 QRS Duration: 94 QT Interval:  400 QTC Calculation: 428 R Axis:   20 Text Interpretation:  Normal sinus rhythm Anterior infarct , age undetermined Abnormal ECG t wave amplitude increased from last tracing Confirmed by Dorie Rank [phone]) on 11/12/2017 5:19:59 PM   Radiology Dg Chest 2 View  Result Date: 11/12/2017 CLINICAL DATA:  Patient presents with AMS onset yesterday. Family states they found her on the floor yesterday - patient thought she was in bed at the time. She is oriented to person, but has had progressively worsening confusion since last nigh.*comment was truncated* EXAM: CHEST - 2 VIEW COMPARISON:  CT 08/17/2017 FINDINGS: Enlarged cardiac silhouette. There is fine linear interstitial pattern within lungs. This has a peripheral distribution. No effusion. No infiltrate IMPRESSION: Interstitial edema and cardiomegaly. Electronically Signed   By: Suzy Bouchard M.D.   On: 11/12/2017 17:16   Ct Head Wo Contrast  Result Date: 11/12/2017 CLINICAL DATA:  AMS. Pt with strong urine odor, unusual. Has been falling out of bed multiple times. Concerned for head injury. EXAM: CT HEAD WITHOUT CONTRAST TECHNIQUE: Contiguous axial images were obtained from the base of the skull through the vertex without intravenous contrast.  COMPARISON:  None. FINDINGS: Brain: No acute intracranial hemorrhage. No focal mass lesion. No CT evidence of acute infarction. No midline shift or mass effect. No hydrocephalus. Basilar cisterns are patent. Minimal white matter microvascular disease Vascular: No hyperdense vessel or unexpected calcification. Skull: Normal. Negative for fracture or focal lesion. Sinuses/Orbits: Paranasal sinuses and mastoid air cells are clear. Orbits are clear. Other: None. IMPRESSION: 1. No acute intracranial findings. 2. Mild microvascular disease. Electronically Signed   By: Suzy Bouchard M.D.   On: 11/12/2017 18:30   Dg Hip Unilat W Or Wo Pelvis 2-3 Views Right  Result Date: 11/12/2017 CLINICAL DATA:  77 year old female with history of right-sided hip pain. Recurrent falls. EXAM: DG HIP (WITH OR WITHOUT PELVIS) 2-3V RIGHT COMPARISON:  None. FINDINGS: There is no evidence of hip fracture or dislocation. There is no evidence of arthropathy or other focal bone abnormality. Surgical clips projecting over both inguinal regions. IMPRESSION: Negative. Electronically Signed   By:  Vinnie Langton M.D.   On: 11/12/2017 18:45    Procedures Procedures (including critical care time)  Medications Ordered in ED Medications  aspirin EC tablet 81 mg (has no administration in time range)  HYDROcodone-acetaminophen (NORCO) 10-325 MG per tablet 1 tablet (has no administration in time range)  bictegravir-emtricitabine-tenofovir AF (BIKTARVY) 50-200-25 MG per tablet 1 tablet (has no administration in time range)  ALPRAZolam (XANAX) tablet 0.5 mg (has no administration in time range)  pantoprazole (PROTONIX) EC tablet 40 mg (has no administration in time range)  dorzolamide (TRUSOPT) 2 % ophthalmic solution 1 drop (has no administration in time range)  latanoprost (XALATAN) 0.005 % ophthalmic solution 1 drop (has no administration in time range)  heparin injection 5,000 Units (has no administration in time range)  sodium  chloride flush (NS) 0.9 % injection 3 mL (has no administration in time range)  acetaminophen (TYLENOL) tablet 650 mg (has no administration in time range)    Or  acetaminophen (TYLENOL) suppository 650 mg (has no administration in time range)  polyethylene glycol (MIRALAX / GLYCOLAX) packet 17 g (has no administration in time range)  brimonidine (ALPHAGAN) 0.2 % ophthalmic solution 1 drop (has no administration in time range)  timolol (TIMOPTIC) 0.5 % ophthalmic solution 1 drop (has no administration in time range)  sodium polystyrene (KAYEXALATE) 15 GM/60ML suspension 15 g (has no administration in time range)  0.9 %  sodium chloride infusion (has no administration in time range)  nicotine (NICODERM CQ - dosed in mg/24 hours) patch 14 mg (has no administration in time range)  sodium chloride 0.9 % bolus 250 mL (0 mLs Intravenous Stopped 11/12/17 2102)  sodium chloride 0.9 % bolus 1,000 mL (0 mLs Intravenous Stopped 11/12/17 2103)  cefTRIAXone (ROCEPHIN) 1 g in sodium chloride 0.9 % 100 mL IVPB (1 g Intravenous New Bag/Given 11/12/17 2116)  calcium gluconate 1 g in sodium chloride 0.9 % 100 mL IVPB (1 g Intravenous New Bag/Given 11/12/17 2258)     Initial Impression / Assessment and Plan / ED Course  I have reviewed the triage vital signs and the nursing notes.  Pertinent labs & imaging results that were available during my care of the patient were reviewed by me and considered in my medical decision making (see chart for details).     77 year old female with a history of HIV (CD4 1700 & viral load 38 on 7/19), benzodiazepine dependence, long-term use of opioid analgesic, and essential HTN who presents to the emergency department from home with a chief complaint of altered mental status.  The patient was evaluated by myself and Dr. Tomi Bamberger after a blood pressure of 65/42 was entered on her arrival vital signs, which I suspect was in error as the patient did not appear hypertensive on my evaluation and  repeat blood pressure was  121/52.  Exam is notable for bilateral lower abdominal tenderness to palpation and right hip discomfort.  The patient was oriented to self only.  She only intermittently followed commands on initial evaluation.  She was able to respond to questions pertaining to herself appropriately.  CT head is negative.  Chest x-ray with interstitial edema and cardiomegaly.  Right hip x-ray is negative for fracture.  EKG with increased amplitude and P waves from previous.  Potassium 5.2.  Patient denied chest pain or dyspnea.  Hyperkalemia treated with IV fluids.  Troponin is negative.  UA with mild hemoglobinuria and rare bacteria.  Urine culture from recent admission in June 2019 is reviewed for comparison,  which grew VRE and E. coli.  However, every other urine culture for the last 8 years has grown E. coli sensitive to Rocephin.  Urine does not appear overly infectious, but given hemoglobinuria, which was present during her positive urine culture, will treat.  Urine culture sent and a 1 dose of Rocephin has been given in the ED.  Labs are notable for creatinine of 4.80, up from 0.74 on 10/19/17. BUN/CR ~15;1.  The patient also has an elevated CK level of ~2450, which has been treated with 1 L of IV fluids.  The patient did have acute renal failure during her admission in June 2019, but her creatinine today is significantly higher.  This could be due in part to her HIV regimen, although she has had no recent medication adjustments to her BIKTARVY.   In regards to the patient's AMS, ammonia level is normal.UDS positive for opioids and benzodiazepines.  Speech was somewhat slurred on initial interview, which has resolved. She is on home hydrocodone acetaminophen 12/14/2023 every 8 hours, but the patient's fentanyl patch for her chronic hip and back pain was DC'd during her last admission and her home Xanax dose was decreased from 1 mg 3 times daily to 0.5 mg 3 times daily.   Given her acute  renal failure, elevated CK level, and acute mental status change, consulted the internal medicine residency team and spoke with Dr. Annie Paras who will accept the patient for admission. The patient appears reasonably stabilized for admission considering the current resources, flow, and capabilities available in the ED at this time, and I doubt any other [facility] requiring further screening and/or treatment in the ED prior to admission.  Final Clinical Impressions(s) / ED Diagnoses   Final diagnoses:  Toxic encephalopathy  Acute renal failure, unspecified acute renal failure type [facility])  Acute renal failure (ARF) [facility])    ED Discharge Orders    None       Maalle Starrett A, PA-C 11/13/17 0000    Dorie Rank, MD 11/14/17 2215

## 2017-11-13 ENCOUNTER — Encounter (HOSPITAL_COMMUNITY): Payer: Self-pay | Admitting: Internal Medicine

## 2017-11-13 DIAGNOSIS — G9341 Metabolic encephalopathy: Secondary | ICD-10-CM

## 2017-11-13 DIAGNOSIS — R011 Cardiac murmur, unspecified: Secondary | ICD-10-CM

## 2017-11-13 DIAGNOSIS — N179 Acute kidney failure, unspecified: Principal | ICD-10-CM

## 2017-11-13 DIAGNOSIS — G8929 Other chronic pain: Secondary | ICD-10-CM

## 2017-11-13 DIAGNOSIS — F17210 Nicotine dependence, cigarettes, uncomplicated: Secondary | ICD-10-CM

## 2017-11-13 DIAGNOSIS — H409 Unspecified glaucoma: Secondary | ICD-10-CM

## 2017-11-13 DIAGNOSIS — F419 Anxiety disorder, unspecified: Secondary | ICD-10-CM

## 2017-11-13 DIAGNOSIS — Z791 Long term (current) use of non-steroidal anti-inflammatories (NSAID): Secondary | ICD-10-CM

## 2017-11-13 DIAGNOSIS — E875 Hyperkalemia: Secondary | ICD-10-CM

## 2017-11-13 DIAGNOSIS — Z79899 Other long term (current) drug therapy: Secondary | ICD-10-CM

## 2017-11-13 DIAGNOSIS — Z960 Presence of urogenital implants: Secondary | ICD-10-CM

## 2017-11-13 DIAGNOSIS — Z7982 Long term (current) use of aspirin: Secondary | ICD-10-CM

## 2017-11-13 DIAGNOSIS — Z951 Presence of aortocoronary bypass graft: Secondary | ICD-10-CM

## 2017-11-13 DIAGNOSIS — N1330 Unspecified hydronephrosis: Secondary | ICD-10-CM

## 2017-11-13 DIAGNOSIS — Z87440 Personal history of urinary (tract) infections: Secondary | ICD-10-CM

## 2017-11-13 DIAGNOSIS — Z79891 Long term (current) use of opiate analgesic: Secondary | ICD-10-CM

## 2017-11-13 DIAGNOSIS — M25559 Pain in unspecified hip: Secondary | ICD-10-CM

## 2017-11-13 DIAGNOSIS — B20 Human immunodeficiency virus [HIV] disease: Secondary | ICD-10-CM

## 2017-11-13 DIAGNOSIS — I10 Essential (primary) hypertension: Secondary | ICD-10-CM

## 2017-11-13 DIAGNOSIS — E785 Hyperlipidemia, unspecified: Secondary | ICD-10-CM

## 2017-11-13 DIAGNOSIS — I739 Peripheral vascular disease, unspecified: Secondary | ICD-10-CM

## 2017-11-13 DIAGNOSIS — E538 Deficiency of other specified B group vitamins: Secondary | ICD-10-CM

## 2017-11-13 DIAGNOSIS — M549 Dorsalgia, unspecified: Secondary | ICD-10-CM

## 2017-11-13 DIAGNOSIS — Z888 Allergy status to other drugs, medicaments and biological substances status: Secondary | ICD-10-CM

## 2017-11-13 DIAGNOSIS — R339 Retention of urine, unspecified: Secondary | ICD-10-CM

## 2017-11-13 LAB — BASIC METABOLIC PANEL
Anion gap: 14 (ref 5–15)
Anion gap: 7 (ref 5–15)
BUN: 59 mg/dL — ABNORMAL HIGH (ref 8–23)
BUN: 46 mg/dL — ABNORMAL HIGH (ref 8–23)
CALCIUM: 9.3 mg/dL (ref 8.9–10.3)
CHLORIDE: 119 mmol/L — AB (ref 98–111)
CO2: 14 mmol/L — ABNORMAL LOW (ref 22–32)
CO2: 15 mmol/L — ABNORMAL LOW (ref 22–32)
CREATININE: 2.52 mg/dL — AB (ref 0.44–1.00)
CREATININE: 1.66 mg/dL — AB (ref 0.44–1.00)
Calcium: 9.4 mg/dL (ref 8.9–10.3)
Chloride: 112 mmol/L — ABNORMAL HIGH (ref 98–111)
GFR calc Af Amer: 34 mL/min — ABNORMAL LOW (ref >60)
GFR calc non Af Amer: 18 mL/min — ABNORMAL LOW (ref >60)
GFR, EST AFRICAN AMERICAN: 20 mL/min — AB (ref >60)
GFR, EST NON AFRICAN AMERICAN: 29 mL/min — AB (ref >60)
GLUCOSE: 85 mg/dL (ref 70–99)
Glucose, Bld: 93 mg/dL (ref 70–99)
Potassium: 5.1 mmol/L (ref 3.5–5.1)
Potassium: 5.3 mmol/L — ABNORMAL HIGH (ref 3.5–5.1)
SODIUM: 141 mmol/L (ref 135–145)
Sodium: 140 mmol/L (ref 135–145)

## 2017-11-13 LAB — URINE CULTURE

## 2017-11-13 LAB — CK
CK TOTAL: 2884 U/L — AB (ref 38–234)
Total CK: 3860 U/L — ABNORMAL HIGH (ref 38–234)

## 2017-11-13 LAB — GLUCOSE, CAPILLARY
GLUCOSE-CAPILLARY: 93 mg/dL (ref 70–99)
GLUCOSE-CAPILLARY: 119 mg/dL — AB (ref 70–99)

## 2017-11-13 LAB — PHOSPHORUS: Phosphorus: 5.4 mg/dL — ABNORMAL HIGH (ref 2.5–4.6)

## 2017-11-13 MED ORDER — ATENOLOL 50 MG PO TABS
50.0000 mg | ORAL_TABLET | Freq: Every day | ORAL | Status: DC
  Administered 2017-11-14: 50 mg via ORAL
  Filled 2017-11-13: qty 1

## 2017-11-13 MED ORDER — AMLODIPINE BESYLATE 10 MG PO TABS
10.0000 mg | ORAL_TABLET | Freq: Every day | ORAL | Status: DC
  Administered 2017-11-14: 10 mg via ORAL
  Filled 2017-11-13: qty 1

## 2017-11-13 MED ORDER — HYDROCODONE-ACETAMINOPHEN 10-325 MG PO TABS: 1.0000 | ORAL_TABLET | Freq: Three times a day (TID) | ORAL | Status: DC | PRN

## 2017-11-13 NOTE — Progress Notes (Addendum)
Subjective:  Jessica [name] is a 77 y.o. 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  She is alert and oriented x1 to name only.  She is unable to recall or describe why or how she was admitted to the hospital.  She endorses groin pain between her thighs.  Unable to describe quality or characteristic of the pain. Daughter not present this morning.   Objective:  Vital signs in last 24 hours: Vitals:   11/12/17 2115 11/12/17 2225 11/13/17 0518 11/13/17 0721  BP: 138/85 (!) 139/54 (!) 123/52 (!) 124/53  Pulse: 99 70 62 63  Resp: 14 16 16  (!) 25  Temp:  (!) 97.4 F (36.3 C) 98.1 F (36.7 C) 98 F (36.7 C)  TempSrc:  Oral  Oral  SpO2: 97% 97% 100% 97%   Physical Exam  Constitutional: She is well-developed, well-nourished, and in no distress.  HENT:  Head: Normocephalic and atraumatic.  Mouth/Throat: Oropharynx is clear and moist. No oropharyngeal exudate.  Eyes: Pupils are equal, round, and reactive to light. Conjunctivae are normal. No scleral icterus.  Neck: Normal range of motion. Neck supple.  Cardiovascular: Normal rate, regular rhythm and intact distal pulses.  Murmur (systolic murmur most prominent over right sternal border) heard. Pulmonary/Chest: Effort normal and breath sounds normal.  Abdominal: Soft. Bowel sounds are normal. There is tenderness (lower quadrants tender to deep palpation).  Transmitted systolic murmur  Genitourinary:  Genitourinary Comments: Foley catheter in place with yellow urine  Musculoskeletal: Normal range of motion. She exhibits edema. She exhibits no tenderness or deformity.  Neurological:  AAOx1 to self  Skin: Skin is warm and dry.  Psychiatric: Mood, memory, affect and judgment normal.   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 This admission prompted by daughter after finding her falling off the bed at night and confused mental state. She had prior admission with ams 08/2017 with UTi w/ e.coli and vre s/p treatment but for this admission her ua was negative. Currently working diagnosis is metabolic encephalopathy secondary to uremia 2/2 urinary retention confirmed on bladder scan and renal ultrasound but still unclear on etiology of the retention and whether this is acute or chronic. Will symptomatically treat and try to resolve her electrolyte abnormalities.  Altered mental status 2/2 metabolic encephalopathy vs drug-induced vs seizures Has hx of THC use. On chronic opioid and benzos. Admitting BUN 74. Ammonia was 12. CT head negative. Urine drug screen + for opioid and benzo. 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 Also endorsing lower abdominal pain. Possibly 2/2 anticholinergic medication but patient unable to supply info on OTC home meds - C/w foley - Trend BMP - Strict I/Os  AKI on chronic 2/2 urinary  retention vs medication induced vs rhabdo Baseline creatinine 0.7 Admit with creatinine of 4.8 now 2.52 after foley. Has hx of chronic ibuprofen 200mg  TID use. CK rising [phone]) Producing 1.4L urine since admission in foley - C/w Foley - Monitor renal function - C/w IVF NS 75cc/hr - Recheck CK, BMP in  PM  Hyperkalemia 2/2 renal failure Down trending 5.1<-5.2. Peaked T waves on EKG. Received Calcium gluconate and Kayexalate 1 time - F/u repeat EKG - Trend BMP - c/w telemetry  HTN Episodes of hypotension (65/42) after admission but currently normotensive 123/57 - Holding home bp meds: amlodipine, atenolol, benazepril, hydralazine  Chronic hip and thigh pain - c/w home meds: Norco 1tab q8hr PRN  Anxiety - C/w xanax  HIV - C/w home Biktarvy  Diet: Renal DVT ppx: sq heparin Bowel: protonix 40mg  qdaily, miralax 17g qdaily Code status: FULL  [name], MD 11/13/2017, 12:21 PM Pager: [phone]  \

## 2017-11-14 ENCOUNTER — Other Ambulatory Visit: Payer: Self-pay

## 2017-11-14 DIAGNOSIS — B009 Herpesviral infection, unspecified: Secondary | ICD-10-CM

## 2017-11-14 DIAGNOSIS — I129 Hypertensive chronic kidney disease with stage 1 through stage 4 chronic kidney disease, or unspecified chronic kidney disease: Secondary | ICD-10-CM

## 2017-11-14 DIAGNOSIS — R4182 Altered mental status, unspecified: Secondary | ICD-10-CM

## 2017-11-14 DIAGNOSIS — N189 Chronic kidney disease, unspecified: Secondary | ICD-10-CM

## 2017-11-14 LAB — BASIC METABOLIC PANEL
ANION GAP: 6 (ref 5–15)
BUN: 25 mg/dL — ABNORMAL HIGH (ref 8–23)
CO2: 16 mmol/L — AB (ref 22–32)
CREATININE: 0.95 mg/dL (ref 0.44–1.00)
Calcium: 10.2 mg/dL (ref 8.9–10.3)
Chloride: 118 mmol/L — ABNORMAL HIGH (ref 98–111)
GFR calc Af Amer: >60 mL/min (ref >60)
GFR, EST NON AFRICAN AMERICAN: 57 mL/min — AB (ref >60)
Glucose, Bld: 101 mg/dL — ABNORMAL HIGH (ref 70–99)
Potassium: 4.9 mmol/L (ref 3.5–5.1)
Sodium: 140 mmol/L (ref 135–145)

## 2017-11-14 LAB — VITAMIN B12: VITAMIN B 12: 209 pg/mL (ref 180–914)

## 2017-11-14 MED ORDER — ALPRAZOLAM 0.5 MG PO TABS: 0.5000 mg | ORAL_TABLET | Freq: Every day | ORAL | 5 refills | Status: DC

## 2017-11-14 NOTE — Discharge Instructions (Signed)
Mrs. Jessica [name] came to [name] with altered mental status and urinary retention. We are discharging you with a foley catheter because your bladder was very stretched out when we examined it and it needs time to heal and recover. The foley catheter will do the job of urinating for you. We will set up a home nurse to help you manage your foley catheter. Here are the recommendation for you at discharge:  - Please work with your home health nurse to keep your foley catheter - Please stop taking your Norco (pain medication) - Please stop taking your hydralazine - Please stop taking your baclofen (muscle relaxer) - Please reduce the amount of Xanax you are taking to once at night time - Please avoid taking Benadryl or other anti-histamine medications  Thank you for choosing [facility]

## 2017-11-14 NOTE — Discharge Summary (Signed)
Name: Jessica [name] MRN: [id number] DOB: 1940-08-30 77 y.o. PCP: Oval Linsey, MD  Date of Admission: 11/12/2017  4:05 PM Date of Discharge: 11/14/2017 4:00 PM Attending Physician: No att. providers found  Discharge Diagnosis: 1. Metabolic encephalopathy 2. Urinary Retention 3. Hyperkalemia  Discharge Medications: Allergies as of 11/14/2017      Reactions   Hctz [hydrochlorothiazide] Other (See Comments)   Dizziness, syncope; does NOT wish to take anymore      Medication List    STOP taking these medications   baclofen 10 MG tablet Commonly known as:  LIORESAL   hydrALAZINE 50 MG tablet Commonly known as:  APRESOLINE   HYDROcodone-acetaminophen 10-325 MG tablet Commonly known as:  NORCO   ibuprofen 200 MG tablet Commonly known as:  ADVIL,MOTRIN     TAKE these medications   alendronate 70 MG tablet Commonly known as:  FOSAMAX Take 1 tablet (70 mg total) by mouth once a week. What changed:  when to take this   ALPRAZolam 0.5 MG tablet Commonly known as:  XANAX Take 1 tablet (0.5 mg total) by mouth at bedtime. What changed:    when to take this  reasons to take this   amLODipine 5 MG tablet Commonly known as:  NORVASC Take 2 tablets (10 mg total) by mouth daily. What changed:    how much to take  when to take this   aspirin 81 MG EC tablet Take 81 mg by mouth daily.   atenolol 50 MG tablet Commonly known as:  TENORMIN Take 1 tablet (50 mg total) by mouth daily.   atorvastatin 10 MG tablet Commonly known as:  LIPITOR TAKE 1 TABLET BY MOUTH EVERY DAY (STOP LOVASTATIN)   benazepril 40 MG tablet Commonly known as:  LOTENSIN TAKE ONE TABLET BY MOUTH DAILY   bictegravir-emtricitabine-tenofovir AF 50-200-25 MG Tabs tablet Commonly known as:  BIKTARVY Take 1 tablet by mouth daily.   calcium-vitamin D 500-200 MG-UNIT tablet Commonly known as:  OSCAL WITH D Take 1 tablet by mouth 2 (two) times daily.   COMBIGAN 0.2-0.5 % ophthalmic  solution Generic drug:  brimonidine-timolol Place 1 drop into the left eye 2 (two) times daily.   diclofenac sodium 1 % Gel Commonly known as:  VOLTAREN Apply 2 g topically 4 (four) times daily. What changed:    when to take this  additional instructions   docusate sodium 100 MG capsule Commonly known as:  COLACE Take 100 mg by mouth 2 (two) times daily.   dorzolamide 2 % ophthalmic solution Commonly known as:  TRUSOPT Place 1 drop into both eyes 2 (two) times daily.   latanoprost 0.005 % ophthalmic solution Commonly known as:  XALATAN Place 1 drop into both eyes at bedtime.   multivitamin with minerals Tabs tablet Take 1 tablet by mouth daily.   Naloxone HCl 0.4 MG/0.4ML Soaj Inject 0.4 mg as directed once as needed. For overdose and dial 911. May give another dose if needed.   pantoprazole 40 MG tablet Commonly known as:  PROTONIX TAKE ONE TABLET BY MOUTH EVERY DAY   spironolactone 100 MG tablet Commonly known as:  ALDACTONE Take 1 tablet (100 mg total) by mouth daily.   spironolactone 25 MG tablet Commonly known as:  ALDACTONE Take 1 tablet (25 mg total) by mouth daily. Notes to patient:  Please review this medication with your provider.   terazosin 5 MG capsule Commonly known as:  HYTRIN Take 1 capsule (5 mg total) by mouth at bedtime.  Disposition and follow-up:   Jessica [name] was discharged from [facility] in Stable condition.  At the hospital follow up visit please address:  1.  Urinary Retention: - Discharged with foley catheter in place with home nursing for foley care - Please evaluate for recovery of bladder function and make decision on whether to continue foley - We were unable to find the source of her urinary retention but stopped all of her home meds which may have contributed. Please work up as appropriate for urinary retention - We checked B12 which was on the lower range of normal and MMA to assess for  B12 deficiency contributing to her urinary retention. MMA was still processing in lab on discharge. Please check the lab value on follow up and replenish as appropriate  2. Hyperkalemia: - Admit with K of 5.3, which resolved after foley and 1 time dose of Kayexalate At discharge K was 4.9 - Please recheck BMP to see that it has stayed WNL   3.  Labs / imaging needed at time of follow-up: BMP, Post-Void Residual  4.  Pending labs/ test needing follow-up: Methylmalonic acid  Follow-up Appointments: Follow-up Information    Oval Linsey, MD. Go on 11/23/2017.   Specialty:  Internal Medicine Why:  11:15 Contact information: [address] [place] [address] [place], Lavell Islam, MD .   Specialty:  Infectious Diseases Contact information: [place] [place] [address] [phone]        [facility]-Home Follow up.   Specialty:  [place] Why:  [facility] for foley care, HHPT Contact information: [place] [address] [phone]          Hospital Course by problem list: 1. Urinary Retention: - Presented with abdominal pain  - Renal US: extremely distended bladder, mild right hydronephrosis - Foley catheter output: day 1 - 1388ml, day 2 - 3074ml, day 3 - 600 - BUN/Creatinine: day 1 - 74/4.8, day 2 - 59/2.52, day 3 - 25/0.95 - Discharged with foley in place and foley catheter  2. Altered mental status: - Initially presented with AAOx2 and making incongruous statements - UTi was negative. Ammonia was WNL, UDS: +benzo, +opioids, - ethanol - Overnight had been agitated, pulling out her lines and attempting to pull out foley - In the morning was AAOx1 and minimally responsive - Improved after having 3L out on foley on day 2 - Discharged with patient back to baseline mentation  3. Acute on Chronic Kidney Disease - Presented with Creatinine of 4.8 (baseline 1.2) - After foley placement  creatinine declined to baseline within 48 hours - Discharged with foley in place (creatine at discharge 0.95)  4. Hyperkalemia 2/2 Renal Failure - Admit with K of 5.3 - Given 1 time dose of Kayexalate - Held home spironolactone and benazepril - Discharged with patient with K WNL (4.9)  5. Chronic opioid and benzo use - Home opioid and benzo stopped on admission - Benzo restarted for agitation and anxiety - MAR review showed 1 dose of benzo at night time - No obvious signs of withdrawal during admission - Discharged with recommendation to stop home Norco and change Xanax from TID to qhs after discussion with patient  Discharge Vitals:   BP (!) 116/97 (BP Location: Right Arm)   Pulse 66   Temp 98 F (36.7 C) (Oral)   Resp 18   Ht 5\' 8"  (  1.727 m)   Wt 60 kg   SpO2 99%   BMI 20.11 kg/m   Pertinent Labs, Studies, and Procedures:  BMP Latest Ref Rng & Units 11/14/2017 11/13/2017 11/13/2017  Glucose 70 - 99 mg/dL 101(H) 93 85  BUN 8 - 23 mg/dL 25(H) 46(H) 59(H)  Creatinine 0.44 - 1.00 mg/dL 0.95 1.66(H) 2.52(H)  BUN/Creat Ratio 12 - 28 - - -  Sodium 135 - 145 mmol/L 140 141 140  Potassium 3.5 - 5.1 mmol/L 4.9 5.3(H) 5.1  Chloride 98 - 111 mmol/L 118(H) 119(H) 112(H)  CO2 22 - 32 mmol/L 16(L) 15(L) 14(L)  Calcium 8.9 - 10.3 mg/dL 10.2 9.4 9.3    Discharge Instructions: Discharge Instructions    Diet - low sodium heart healthy   Complete by:  As directed    Increase activity slowly   Complete by:  As directed       Signed: [name], MD 11/16/2017, 3:47 PM   Pager: [phone]

## 2017-11-14 NOTE — Progress Notes (Signed)
Patient discharged to home, AVS reviewed including changes to medications. Foley care reviewed and home health nurse arranged to help patient manage her catheter. She also received her walker. Left floor via wheelchair with staff member

## 2017-11-14 NOTE — Evaluation (Signed)
Physical Therapy Evaluation Patient Details Name: Jessica [name] MRN: [id number] DOB: [birth year] Today's Date: 11/14/2017   History of Present Illness  77yo female brought to the ED by her daughter due to AMS, being found on the floor at night, urinary incontinence. She did have a recent admission in June for encephalopathy. Diagnosed with acute encephalopathy, urinary retention, AKI. PMH ARF, anxiety, hearing loss, R shoulder bursitis, HIV, lumbar DDD, R RCR, SBO   Clinical Impression   Patient received in bed, very pleasant and willing to participate in skilled PT session this afternoon. She is able to complete bed mobility with S, and functional transfers with and without RW with general S today. Able to ambulate 18f with RW and S, but does require min guard to ambulate 1032fwithout assistive device due to mild general unsteadiness. She was left sitting at the EOB with lunch set up, bed alarm active, family present, and all other needs met today. She will continue to benefit from skilled PT services in the acute setting as well as skilled HHPT services to further address functional deficits moving forward.     Follow Up Recommendations Home health PT    Equipment Recommendations  Rolling walker with 5" wheels    Recommendations for Other Services       Precautions / Restrictions Precautions Precautions: Fall Restrictions Weight Bearing Restrictions: No      Mobility  Bed Mobility Overal bed mobility: Needs Assistance Bed Mobility: Supine to Sit     Supine to sit: Supervision     General bed mobility comments: S for safety, no physical assistance given   Transfers Overall transfer level: Needs assistance Equipment used: Rolling walker (2 wheeled);None Transfers: Sit to/from Stand Sit to Stand: Supervision         General transfer comment: S for functional sit to stand with no device and with RW; VC provided for correct sequencing with RW    Ambulation/Gait Ambulation/Gait assistance: Min guard;Supervision Gait Distance (Feet): 100 Feet(x2) Assistive device: Rolling walker (2 wheeled);None Gait Pattern/deviations: Step-through pattern;Decreased step length - right;Decreased step length - left;Decreased stride length;Drifts right/left;Trunk flexed Gait velocity: decreased    General Gait Details: able to ambulate 10052fith RW, S; also able to ambulate approximately 100f48fth no device, min guard for safety with mild unsteadiness especially on turns   Stairs            Wheelchair Mobility    Modified Rankin (Stroke Patients Only)       Balance Overall balance assessment: Mild deficits observed, not formally tested                                           Pertinent Vitals/Pain Pain Assessment: No/denies pain    Home Living Family/patient expects to be discharged to:: Private residence Living Arrangements: Children;Other relatives Available Help at Discharge: Family Type of Home: House Home Access: Stairs to enter Entrance Stairs-Rails: None Entrance Stairs-Number of Steps: 2-3 Home Layout: One level Home Equipment: [place] wheels;Bedside commode;Shower seat      Prior Function Level of Independence: Independent with assistive device(s)         Comments: household ambulator with Rollator     Hand Dominance        Extremity/Trunk Assessment   Upper Extremity Assessment Upper Extremity Assessment: Generalized weakness    Lower Extremity Assessment Lower Extremity Assessment: Generalized  weakness    Cervical / Trunk Assessment Cervical / Trunk Assessment: Kyphotic  Communication   Communication: No difficulties  Cognition Arousal/Alertness: Awake/alert Behavior During Therapy: WFL for tasks assessed/performed Overall Cognitive Status: Within Functional Limits for tasks assessed                                        General Comments       Exercises     Assessment/Plan    PT Assessment Patient needs continued PT services  PT Problem List Decreased strength;Decreased activity tolerance;Decreased safety awareness;Decreased balance;Decreased mobility;Decreased coordination       PT Treatment Interventions DME instruction;Balance training;Gait training;Neuromuscular re-education;Stair training;Functional mobility training;Patient/family education;Therapeutic activities;Therapeutic exercise    PT Goals (Current goals can be found in the Care Plan section)  Acute Rehab PT Goals Patient Stated Goal: to go home  PT Goal Formulation: With patient/family Time For Goal Achievement: 11/28/17 Potential to Achieve Goals: Good    Frequency Min 3X/week   Barriers to discharge        Co-evaluation               AM-PAC PT "6 Clicks" Daily Activity  Outcome Measure Difficulty turning over in bed (including adjusting bedclothes, sheets and blankets)?: None Difficulty moving from lying on back to sitting on the side of the bed? : None Difficulty sitting down on and standing up from a chair with arms (e.g., wheelchair, bedside commode, etc,.)?: A Little Help needed moving to and from a bed to chair (including a wheelchair)?: A Little Help needed walking in hospital room?: A Little Help needed climbing 3-5 steps with a railing? : A Little 6 Click Score: 20    End of Session   Activity Tolerance: Patient tolerated treatment well Patient left: in bed;with call bell/phone within reach;with bed alarm set;with family/visitor present(sitting at EOB eating lunch ) Nurse Communication: Mobility status PT Visit Diagnosis: Unsteadiness on feet (R26.81);Muscle weakness (generalized) (M62.81);History of falling (Z91.81)    Time: 7494-4967 PT Time Calculation (min) (ACUTE ONLY): 23 min   Charges:   PT Evaluation $PT Eval Low Complexity: 1 Low PT Treatments $Gait Training: 8-22 mins        Deniece Ree PT, DPT,  CBIS  Supplemental Physical Therapist [place]    Pager [phone] Acute Rehab Office [phone]

## 2017-11-14 NOTE — Progress Notes (Signed)
Subjective:  Jessica [name] is a 77 y.o. with PMH of HIV, HTN, HLD, PVD, Chronic opioid and benzo use admit for altered mental status and urinary retention on hospital day 2  [name] was examined and evaluated at bedside this a.m with her sister present. She is alert and oriented x3. She states that she had a rough night because she was unable to leave her bed as she is on fall precautions and the bed alarm kept going off.  She states that she wants to go home. The necessity of Foley on discharge was explained to her.  She is agreeable to being discharged with home health nursing for Foley care. She is still endorsing abdominal pain but denies any fever nausea vomiting diarrhea constipation.  We discussed all the possible medications that may cause urinary retention.  She stated that she does not understand 'how medications she has been on for years' could cause this issue, but she was agreeable to reducing her benzo use as well as no longer taking her opioid medication in addition to stopping her hydralazine and baclofen.    Objective:  Vital signs in last 24 hours: Vitals:   11/13/17 0721 11/13/17 2145 11/14/17 0618 11/14/17 0647  BP: (!) 124/53 (!) 142/58  (!) 116/97  Pulse: 63 75  66  Resp: (!) 25 18  18   Temp: 98 F (36.7 C) 97.8 F (36.6 C)  98 F (36.7 C)  TempSrc: Oral Oral  Oral  SpO2: 97% 98%  99%  Weight:   60 kg   Height:   5\' 8"  (1.727 m)    Physical Exam  Constitutional: She is oriented to person, place, and time and well-developed, well-nourished, and in no distress. No distress.  Eating breakfast comfortably in bed  HENT:  Head: Normocephalic and atraumatic.  Mouth/Throat: Oropharynx is clear and moist. No oropharyngeal exudate.  Neck: Normal range of motion. Neck supple.  Cardiovascular: Normal rate, regular rhythm and intact distal pulses.  Murmur (systolic murmur) heard. Pulmonary/Chest: Effort normal and breath sounds normal. No respiratory  distress. She has no wheezes. She has no rales.  Abdominal: Soft. Bowel sounds are normal. She exhibits no distension. There is tenderness (umbilical tenderness to deep palpation). There is no rebound.  Musculoskeletal: Normal range of motion. She exhibits no edema, tenderness or deformity.  Neurological: She is alert and oriented to person, place, and time. No cranial nerve deficit. GCS score is 15.  Skin: Skin is warm and dry. She is not diaphoretic.  Psychiatric: Mood, affect and judgment normal.  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 Over the last 24 hours she put out 3Ls through her foley and her Bun and creatinine have normalized. Her mentation has significantly improved and she is back to baseline per her sister. Most likely explanation of her urinary retention is medication induced so we will discharge with reduction or discontinuation of possible culprits.  Altered mental status 2/2 metabolic encephalopathy  Mentation back to baseline. Cannot recall her memory during her encephalopathic event - Resolved  Urinary retention 2/2 medication induced vs obstruction No known history of urinary retention but confirmed on renal ultrasound with distended  bladder and hydronephrosis. Continuing to endorse lower abdominal pain. 3L yellow urine through foley last 24 hours - C/w foley - Discharge today  AKI on chronic CKD 2/2 urinary retention vs medication induced vs rhabdo Baseline creatinine 0.7 Admit with creatinine of 4.8. With foley and urine output, decreased to 0.95<-1.66<-2.52  Resolved  Hyperkalemia 2/2 renal failure Down trending 4.9<-5.1<-5.2.    Resolved  HTN Episodes of hypotension (65/42) after admission but currently normotensive 123/57 - Restart home bp meds: amlodipine, atenolol, benazepril, hydralazine  Chronic hip and thigh pain Has not been taking opioids since admission. - D/c Norco 1tab q8hr PRN  Anxiety - C/w xanax  HIV - C/w home Biktarvy  Diet: Renal DVT ppx: sq heparin Bowel: protonix 40mg  qdaily, miralax 17g qdaily Code status: FULL  Dispo: Anticipated discharge in approximately today(s).   [name], MD 11/14/2017, 12:48 PM Pager: [phone]

## 2017-11-14 NOTE — Progress Notes (Signed)
  Date: 11/14/2017  Patient name: Jessica [name]  Medical record number: [id number]  Date of birth: 02/13/1941   I have seen and evaluated this patient and I have discussed the plan of care with the house staff. Please see their note for complete details. I concur with their findings with the following additions/corrections: [name] was seen on morning rounds.  Her mental status changes have completely resolved.  Her sister was at bedside today.  We discussed the urinary retention that was found in the etiology of her altered mental status.  We do not yet have a great etiology for the urinary retention but she is on several medications that have been associated with urinary retention.  Her hydralazine was recently increased on August 9th and hydralazine has been associated with urinary retention so we are stopping her hydralazine.  Additionally, her hydrocodone can also be associated with urinary retention and she has not had any requirements for this medicine as an inpatient.  We stressed to her that we strongly recommend she stop taking the medication as it could trigger another episode of urinary retention.  Finally, we have recommended that she decrease her Xanax from 0.5 3 times daily to 0.5 at bedtime.  She has only gotten it at bedtime these past 2 nights and seems to be doing well with chest dosing at night.  She accepted these recommendations and is willing to give this a trial although she wants Dr. Caroline [name] input on these changes.  We will schedule her a follow-up in [facility] on September 12.  Hopefully, home health will be able to pull her Foley on the 11th so that we can do a post void residual on the 12th to ensure she does not have recurrence of the urinary retention.  If she continues to have urinary retention despite the medication changes, she will need an examination to assess for prolapse and might also need a ureterogram to ensure she does not have a stricture of her  ureter.  Additionally, I noticed herpes was on her problem list and it does not appear that she has had an outbreak since 2017.  Herpes can also cause acute urinary retention and will need to be considered.  She is stable for discharge to home with a Foley catheter and home health for Foley care.  Home health to pull the Foley on the 11th.  Follow-up in [facility] on the 12th.  Jessica Crews, MD 11/14/2017, 3:36 PM

## 2017-11-14 NOTE — Care Management Note (Addendum)
Case Management Note  Patient Details  Name: Jessica [name] MRN: [id number] Date of Birth: 1940/04/01  Subjective/Objective:  Patient for dc today, NCM offered choice to patient for [facility] for foley care, she chose [facility], referral made to [facility] with [facility], they will do foley care per [facility] protocol.  Per PT eval rec HHPT also, this was added to services with Jermaine with [facility]  And Jeneen Rinks with [facility] will bring rolling walker to patient's room prior to dc.                    Action/Plan: DC home when ready.  Expected Discharge Date:  11/14/17               Expected Discharge Plan:  [place]  In-House Referral:     Discharge planning Services  CM Consult  Post Acute Care Choice:  Home Health Choice offered to:  Patient  DME Arranged:   walker,rolling DME Agency:   [place]  HH Arranged:  RN, PT [facility] Agency:  [place]  Status of Service:  Completed, signed off  If discussed at [place] of Stay Meetings, dates discussed:    Additional Comments:  Zenon Mayo, RN 11/14/2017, 12:53 PM

## 2017-11-15 ENCOUNTER — Telehealth: Payer: Self-pay | Admitting: Internal Medicine

## 2017-11-15 ENCOUNTER — Other Ambulatory Visit: Payer: Self-pay | Admitting: Internal Medicine

## 2017-11-15 DIAGNOSIS — R339 Retention of urine, unspecified: Secondary | ICD-10-CM | POA: Diagnosis not present

## 2017-11-15 DIAGNOSIS — M5136 Other intervertebral disc degeneration, lumbar region: Secondary | ICD-10-CM | POA: Diagnosis not present

## 2017-11-15 DIAGNOSIS — M7551 Bursitis of right shoulder: Secondary | ICD-10-CM | POA: Diagnosis not present

## 2017-11-15 DIAGNOSIS — Z466 Encounter for fitting and adjustment of urinary device: Secondary | ICD-10-CM | POA: Diagnosis not present

## 2017-11-15 DIAGNOSIS — R69 Illness, unspecified: Secondary | ICD-10-CM | POA: Diagnosis not present

## 2017-11-15 DIAGNOSIS — I10 Essential (primary) hypertension: Secondary | ICD-10-CM | POA: Diagnosis not present

## 2017-11-15 DIAGNOSIS — I739 Peripheral vascular disease, unspecified: Secondary | ICD-10-CM | POA: Diagnosis not present

## 2017-11-15 NOTE — Telephone Encounter (Signed)
Pt is didn't received the antiboitics prescription in the discharge package per [facility] [phone].

## 2017-11-15 NOTE — Telephone Encounter (Signed)
Returned call to Malcolm with [facility] at [phone]. No answer and line disconnected after 10 rings. Hubbard Hartshorn, RN, BSN

## 2017-11-15 NOTE — Telephone Encounter (Signed)
No ABX needed. Nitro inadvertently put on D/C med list

## 2017-11-17 LAB — CULTURE, BLOOD (ROUTINE X 2): CULTURE: NO GROWTH

## 2017-11-19 ENCOUNTER — Telehealth: Payer: Self-pay | Admitting: *Deleted

## 2017-11-19 DIAGNOSIS — R69 Illness, unspecified: Secondary | ICD-10-CM | POA: Diagnosis not present

## 2017-11-19 DIAGNOSIS — Z466 Encounter for fitting and adjustment of urinary device: Secondary | ICD-10-CM | POA: Diagnosis not present

## 2017-11-19 DIAGNOSIS — M7551 Bursitis of right shoulder: Secondary | ICD-10-CM | POA: Diagnosis not present

## 2017-11-19 DIAGNOSIS — M5136 Other intervertebral disc degeneration, lumbar region: Secondary | ICD-10-CM | POA: Diagnosis not present

## 2017-11-19 DIAGNOSIS — I739 Peripheral vascular disease, unspecified: Secondary | ICD-10-CM | POA: Diagnosis not present

## 2017-11-19 DIAGNOSIS — I10 Essential (primary) hypertension: Secondary | ICD-10-CM | POA: Diagnosis not present

## 2017-11-19 DIAGNOSIS — R339 Retention of urine, unspecified: Secondary | ICD-10-CM | POA: Diagnosis not present

## 2017-11-19 NOTE — Telephone Encounter (Signed)
Debbie, PT with [facility] called requesting VO for [facility] PT twice weekly x 2 weeks for gait, balance and home safety. Patient had been prn cane use. Now with walker and foley cath in place. Will route to PCP for consideration. Hubbard Hartshorn, RN, BSN

## 2017-11-19 NOTE — Telephone Encounter (Signed)
Please provide [name] [facility] PT twice weekly x 2 weeks for gait.  Thank You.

## 2017-11-20 NOTE — Telephone Encounter (Signed)
Lucky at [facility] given VO to have foley pulled on 12th vs 11th. Hubbard Hartshorn, RN, BSN

## 2017-11-20 NOTE — Telephone Encounter (Signed)
[place] notified. She will also relay to [facility] RN VO to pull foley cath on 11/21/2017 per Dr. Lynnae January. Hubbard Hartshorn, RN, BSN

## 2017-11-20 NOTE — Telephone Encounter (Signed)
I had thought she was coming to [facility] on the 12th but appt with Dr Eppie Gibson (PCP) on the 13th. Can home health pull foley on Thur 12th instead? She will need PVR at appt on 13th.

## 2017-11-21 LAB — METHYLMALONIC ACID, SERUM: Methylmalonic Acid, Quantitative: 195 nmol/L (ref 0–378)

## 2017-11-22 DIAGNOSIS — I10 Essential (primary) hypertension: Secondary | ICD-10-CM | POA: Diagnosis not present

## 2017-11-22 DIAGNOSIS — K56609 Unspecified intestinal obstruction, unspecified as to partial versus complete obstruction: Secondary | ICD-10-CM | POA: Diagnosis not present

## 2017-11-22 DIAGNOSIS — Z466 Encounter for fitting and adjustment of urinary device: Secondary | ICD-10-CM | POA: Diagnosis not present

## 2017-11-22 DIAGNOSIS — Z8739 Personal history of other diseases of the musculoskeletal system and connective tissue: Secondary | ICD-10-CM | POA: Diagnosis not present

## 2017-11-22 DIAGNOSIS — R339 Retention of urine, unspecified: Secondary | ICD-10-CM | POA: Diagnosis not present

## 2017-11-22 DIAGNOSIS — I739 Peripheral vascular disease, unspecified: Secondary | ICD-10-CM | POA: Diagnosis not present

## 2017-11-22 DIAGNOSIS — M7551 Bursitis of right shoulder: Secondary | ICD-10-CM | POA: Diagnosis not present

## 2017-11-22 DIAGNOSIS — R69 Illness, unspecified: Secondary | ICD-10-CM | POA: Diagnosis not present

## 2017-11-22 DIAGNOSIS — M5136 Other intervertebral disc degeneration, lumbar region: Secondary | ICD-10-CM | POA: Diagnosis not present

## 2017-11-23 ENCOUNTER — Encounter: Payer: Self-pay | Admitting: Internal Medicine

## 2017-11-23 ENCOUNTER — Ambulatory Visit (INDEPENDENT_AMBULATORY_CARE_PROVIDER_SITE_OTHER): Payer: Medicare HMO | Admitting: Internal Medicine

## 2017-11-23 VITALS — BP 177/76 | HR 83 | Temp 98.6°F | Wt 125.4 lb

## 2017-11-23 DIAGNOSIS — E785 Hyperlipidemia, unspecified: Secondary | ICD-10-CM

## 2017-11-23 DIAGNOSIS — G4700 Insomnia, unspecified: Secondary | ICD-10-CM | POA: Diagnosis not present

## 2017-11-23 DIAGNOSIS — I10 Essential (primary) hypertension: Secondary | ICD-10-CM | POA: Diagnosis not present

## 2017-11-23 DIAGNOSIS — F411 Generalized anxiety disorder: Secondary | ICD-10-CM | POA: Diagnosis not present

## 2017-11-23 DIAGNOSIS — B20 Human immunodeficiency virus [HIV] disease: Secondary | ICD-10-CM

## 2017-11-23 DIAGNOSIS — Z87448 Personal history of other diseases of urinary system: Secondary | ICD-10-CM

## 2017-11-23 DIAGNOSIS — Z466 Encounter for fitting and adjustment of urinary device: Secondary | ICD-10-CM | POA: Diagnosis not present

## 2017-11-23 DIAGNOSIS — M5136 Other intervertebral disc degeneration, lumbar region: Secondary | ICD-10-CM

## 2017-11-23 DIAGNOSIS — E7800 Pure hypercholesterolemia, unspecified: Secondary | ICD-10-CM

## 2017-11-23 DIAGNOSIS — F1320 Sedative, hypnotic or anxiolytic dependence, uncomplicated: Secondary | ICD-10-CM

## 2017-11-23 DIAGNOSIS — G8929 Other chronic pain: Secondary | ICD-10-CM

## 2017-11-23 DIAGNOSIS — M75101 Unspecified rotator cuff tear or rupture of right shoulder, not specified as traumatic: Secondary | ICD-10-CM

## 2017-11-23 DIAGNOSIS — Z79891 Long term (current) use of opiate analgesic: Secondary | ICD-10-CM | POA: Diagnosis not present

## 2017-11-23 DIAGNOSIS — I739 Peripheral vascular disease, unspecified: Secondary | ICD-10-CM | POA: Diagnosis not present

## 2017-11-23 DIAGNOSIS — Z23 Encounter for immunization: Secondary | ICD-10-CM

## 2017-11-23 DIAGNOSIS — M545 Low back pain: Secondary | ICD-10-CM

## 2017-11-23 DIAGNOSIS — R0789 Other chest pain: Secondary | ICD-10-CM | POA: Diagnosis not present

## 2017-11-23 DIAGNOSIS — R339 Retention of urine, unspecified: Secondary | ICD-10-CM | POA: Diagnosis not present

## 2017-11-23 DIAGNOSIS — Z8669 Personal history of other diseases of the nervous system and sense organs: Secondary | ICD-10-CM

## 2017-11-23 DIAGNOSIS — M7551 Bursitis of right shoulder: Secondary | ICD-10-CM | POA: Diagnosis not present

## 2017-11-23 DIAGNOSIS — Z79899 Other long term (current) drug therapy: Secondary | ICD-10-CM

## 2017-11-23 DIAGNOSIS — Z720 Tobacco use: Secondary | ICD-10-CM

## 2017-11-23 DIAGNOSIS — R69 Illness, unspecified: Secondary | ICD-10-CM | POA: Diagnosis not present

## 2017-11-23 MED ORDER — ALPRAZOLAM 1 MG PO TABS: 1.0000 mg | ORAL_TABLET | Freq: Every evening | ORAL | 5 refills | Status: DC | PRN

## 2017-11-23 NOTE — Assessment & Plan Note (Signed)
Assessment  I suspect [name] has continued benzodiazepine dependence given how long she has been on alprazolam.  She notes that it is effective in controlling her anxiety, especially at night and helping her with sleep.  Plan  The [facility] controlled substance database was reviewed and was without any red flags.  I increase the alprazolam to 1 mg by mouth at night as needed for anxiety and sleep.  This dose was effective in the past.  I am comfortable increasing the nighttime dose as we have eliminated the morning and noontime doses.  We will reassess the efficacy of the alprazolam at 1 mg nightly as needed for anxiety and sleep at the follow-up visit.

## 2017-11-23 NOTE — Progress Notes (Signed)
   Subjective:    Patient ID: Jessica [name], female    DOB: Oct 03, 1940, 77 y.o.   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 Please see the A&P for the status of the pt's chronic medical problems.  Since the last clinic visit Jessica [name] had been admitted for toxic encephalopathy.  She was found to have urinary retention.  Several medications were discontinued including the hydralazine and hydrocodone which were felt to possibly be contributing to the urinary retention.  With relief of the urinary retention via a Foley catheter her toxic encephalopathy resolved.  She was discharged home with a urinary catheter and this was removed yesterday via urology.  Upon presentation today she was found to have a post void residual of 10 cc.  Despite the narcotics being completely discontinued she denied any pain whatsoever.  Her only complaint today was that the alprazolam 0.5 mg by mouth at night as needed for anxiety and sleep was ineffective.  She felt the 1 mg dose was much more effective.  She was without other acute complaints and stated she felt quite well.  Review of Systems  Constitutional: Negative for activity change, appetite change and unexpected weight change.  Respiratory: Negative for chest tightness and shortness of breath.   Cardiovascular: Positive for chest pain and leg swelling. Negative for palpitations.       She has had right peristernal chest pain since admission which is worse with movement of the torso.  He chronic leg swelling is unchanged.  Gastrointestinal: Negative for abdominal distention and abdominal pain.  Genitourinary: Negative for decreased urine volume, difficulty urinating, dysuria, frequency and urgency.    Musculoskeletal: Negative for arthralgias, back pain, joint swelling and myalgias.  Skin: Negative for rash.  Psychiatric/Behavioral: Positive for sleep disturbance. Negative for confusion and dysphoric mood.      Objective:   Physical Exam  Constitutional: She is oriented to person, place, and time. She appears well-developed and well-nourished. No distress.  HENT:  Head: Normocephalic and atraumatic.  Eyes: Conjunctivae are normal. Right eye exhibits no discharge. Left eye exhibits no discharge. No scleral icterus.  Cardiovascular: Normal rate, regular rhythm and normal heart sounds. Exam reveals no gallop and no friction rub.  No murmur heard. Pulmonary/Chest: Effort normal and breath sounds normal. No stridor. No respiratory distress. She has no wheezes. She has no rales. She exhibits no tenderness.  Right peristernal chest pain not reproducible with chest wall palpation  Abdominal: Soft. She exhibits no distension. There is no tenderness. There is no rebound and no guarding.  Musculoskeletal: Normal range of motion. She exhibits edema. She exhibits no tenderness or deformity.  Neurological: She is alert and oriented to person, place, and time. She exhibits normal muscle tone.  Skin: Skin is warm and dry. No rash noted. She is not diaphoretic. No erythema.  Psychiatric: She has a normal mood and affect. Her behavior is normal. Judgment and thought content normal.  Nursing note and vitals reviewed.     Assessment & Plan:   Please see problem oriented charting.

## 2017-11-23 NOTE — Assessment & Plan Note (Signed)
Assessment  Despite the complete discontinuance of the hydrocodone, and prior to that concomitant fentanyl, she has not had an exacerbation of her lumbar disc disease with chronic low back pain.  Plan  The [facility] controlled substance database was reviewed and was without red flags.  She has not renewed any of her prescriptions since discharge from the hospital.  I called her pharmacy and discontinued the remaining hydrocodone prescription.  We will use as needed nonsteroidal anti-inflammatories and Tylenol to manage her low back pain.  We will reassess the efficacy of this intervention at controlling her symptoms at the follow-up visit.  A urine drug screen was obtained during this visit and is pending at the time of this dictation.

## 2017-11-23 NOTE — Assessment & Plan Note (Signed)
She received a flu vaccination today.  A mammogram was ordered at the last visit and is pending at this time.  We will reorder it at the follow-up visit if she still has not completed it.  The same is true of a DEXA scan.  This will be reordered at the follow-up visit if it has yet to be completed by that time.  She is otherwise up-to-date in her healthcare maintenance.

## 2017-11-23 NOTE — Assessment & Plan Note (Signed)
Assessment  She has a long-standing history of benzodiazepine use for her generalized anxiety disorder.  This has resulted in benzodiazepine dependence.  That said, we have been able to wean down the benzodiazepines.  At discharge her alprazolam dose was 0.5 mg by mouth at night as needed.  She found this to be ineffective at controlling her anxiety and insomnia and asked to increase the dose to 1 mg at night as needed for anxiety and insomnia as this has worked in the past.  Given her benzodiazepine dependence and effectiveness of this dose this is a reasonable request.  Plan  I increase the alprazolam to 1 mg by mouth at night as needed for anxiety and sleep.  A new prescription was written with a total number to be dispensed of 30/month.  The pharmacy was called and the previous prescription was discontinued.  We will reassess the efficacy of this dose at the follow-up visit.

## 2017-11-23 NOTE — Assessment & Plan Note (Signed)
Assessment  She is tolerating the atorvastatin 10 mg by mouth daily without myalgias.  Plan  We will continue with the moderate intensity statin and reassess for intolerances at the follow-up visit.

## 2017-11-23 NOTE — Patient Instructions (Signed)
It was great to see you again.  This is the best I have ever seen you.  I am glad most of your pain is controlled.  1) Keep taking all of your medications as you are.  2) I increased the alprazolam to 1 mg at night as needed for anxiety.  I sent the prescription to your drug store.  3) Please let me know if you change your mind and need help quitting smoking.  I will see you back in 6 months, sooner if necessary.

## 2017-11-23 NOTE — Assessment & Plan Note (Signed)
Assessment  She is followed closely by the [facility] for Infectious Diseases and is tolerating her therapy well.  Plan  She will continue with her current antiretroviral therapy regimen and follow-up in the [facility] for Infectious Diseases.

## 2017-11-23 NOTE — Assessment & Plan Note (Signed)
Assessment  Her blood pressure was elevated today 177/76.  This is an isolated reading since the previous reading was approximately 1 week ago at 116/97.  Plan  We will continue the current regimen of amlodipine 10 mg by mouth daily, atenolol 50 mg by mouth daily, benazepril 40 mg by mouth daily, spironolactone 125 mg by mouth daily, and terazosin 5 mg by mouth daily.  We will reassess the blood pressure at the follow-up visit.  Should adjustments be required we could either increase the spironolactone or the terazosin.

## 2017-11-23 NOTE — Assessment & Plan Note (Signed)
Assessment  She remains in the pre-contemplative stage for tobacco cessation and is not mentally ready to quit at this time.  Plan  She was asked to contact us if she should change her mind and we can provide her with pharmacotherapy to help with smoking cessation.  Her readiness to quit smoking will be reassessed at the follow-up visit.

## 2017-11-23 NOTE — Assessment & Plan Note (Signed)
Assessment  Her right shoulder pain is well controlled on the Voltaren gel.  Plan  She was asked to continue the Voltaren gel for her chronic right shoulder pain and to try some on the right chest to see if that too could provide some relief for her apparent musculoskeletal pain of the chest.

## 2017-11-28 DIAGNOSIS — R339 Retention of urine, unspecified: Secondary | ICD-10-CM | POA: Diagnosis not present

## 2017-11-28 DIAGNOSIS — I10 Essential (primary) hypertension: Secondary | ICD-10-CM | POA: Diagnosis not present

## 2017-11-28 DIAGNOSIS — M5136 Other intervertebral disc degeneration, lumbar region: Secondary | ICD-10-CM | POA: Diagnosis not present

## 2017-11-28 DIAGNOSIS — M7551 Bursitis of right shoulder: Secondary | ICD-10-CM | POA: Diagnosis not present

## 2017-11-28 DIAGNOSIS — I739 Peripheral vascular disease, unspecified: Secondary | ICD-10-CM | POA: Diagnosis not present

## 2017-11-28 DIAGNOSIS — R69 Illness, unspecified: Secondary | ICD-10-CM | POA: Diagnosis not present

## 2017-11-28 DIAGNOSIS — Z466 Encounter for fitting and adjustment of urinary device: Secondary | ICD-10-CM | POA: Diagnosis not present

## 2017-11-29 LAB — TOXASSURE SELECT,+ANTIDEPR,UR

## 2017-11-30 NOTE — Progress Notes (Signed)
Patient ID: Jessica [name], female   DOB: [birth year], 77 y.o.   MRN: [id number]  Her UDS was notable for metabolites of the following medications:  Alprazolam (appropriate) Hydrocodone( (inappropriate) Cyclobenzaprine (inappropriate) Amitriptyline (inappropriate)  I have always been concerned that she has a supply of medications at home which she takes despite what we ask her to stop.  I believe that this contributes to the difficulties she has had with polypharmacy.  At this point, she will not be given any refills for the medications that are inappropriate and it is hoped she will run out of them soon.  I did call [facility] and she does not have any further prescriptions for fentanyl and hydrocodone.  She has not filled a cyclobenzaprine for over a year, so this is either an old supply or medication from a "friend". If it is an old supply she has no further refills.  She has never been given a prescription for amitriptyline from [facility], so this may also be a very old supply or also from a "friend".  Given this, a repeat UDS is appropriate at the follow-up visit.

## 2017-12-04 ENCOUNTER — Other Ambulatory Visit: Payer: Self-pay | Admitting: Internal Medicine

## 2017-12-04 ENCOUNTER — Ambulatory Visit (INDEPENDENT_AMBULATORY_CARE_PROVIDER_SITE_OTHER): Payer: Medicare HMO | Admitting: Infectious Disease

## 2017-12-04 ENCOUNTER — Ambulatory Visit
Admission: RE | Admit: 2017-12-04 | Discharge: 2017-12-04 | Disposition: A | Discharge status: Home / Self Care | Payer: Medicare HMO | Source: Ambulatory Visit | Attending: Infectious Disease | Admitting: Infectious Disease

## 2017-12-04 ENCOUNTER — Encounter: Payer: Self-pay | Admitting: Infectious Disease

## 2017-12-04 VITALS — BP 144/69 | HR 51 | Temp 97.7°F | Ht 68.0 in | Wt 125.0 lb

## 2017-12-04 DIAGNOSIS — B20 Human immunodeficiency virus [HIV] disease: Secondary | ICD-10-CM

## 2017-12-04 DIAGNOSIS — I10 Essential (primary) hypertension: Secondary | ICD-10-CM | POA: Diagnosis not present

## 2017-12-04 DIAGNOSIS — Z113 Encounter for screening for infections with a predominantly sexual mode of transmission: Secondary | ICD-10-CM

## 2017-12-04 DIAGNOSIS — M791 Myalgia, unspecified site: Secondary | ICD-10-CM

## 2017-12-04 DIAGNOSIS — Z720 Tobacco use: Secondary | ICD-10-CM | POA: Diagnosis not present

## 2017-12-04 DIAGNOSIS — R69 Illness, unspecified: Secondary | ICD-10-CM | POA: Diagnosis not present

## 2017-12-04 DIAGNOSIS — M25511 Pain in right shoulder: Secondary | ICD-10-CM

## 2017-12-04 DIAGNOSIS — M5136 Other intervertebral disc degeneration, lumbar region: Secondary | ICD-10-CM

## 2017-12-04 DIAGNOSIS — Z79899 Other long term (current) drug therapy: Secondary | ICD-10-CM | POA: Diagnosis not present

## 2017-12-04 HISTORY — DX: Pain in right shoulder: M25.511

## 2017-12-04 NOTE — Progress Notes (Signed)
Chief complaint: Follow-up for HIV on medication, continued shoulder pain  Subjective:    Patient ID: Jessica [name], female    DOB: [birth year], 77 y.o.   MRN: [id number]  HPI  Jessica [name] is a 77 y.o. female with HIV infection who has done superbly well on her antiviral regimen, changed from Atripla to [facility] and DESCOVY -->BIKTARVY with reasonable virological suppression.  Lab Results  Component Value Date   HIV1RNAQUANT 38 (H) 09/11/2017   HIV1RNAQUANT 63 (H) 05/11/2017   HIV1RNAQUANT <20 NOT DETECTED 03/19/2017   She was recently admitted to [facility] with a toxic metabolic encephalopathy and urinary retention.  She is on the internal medicine teaching service.  She is being followed closely and followed by Dr. Eppie Gibson in his clinic.  She claims that she injured her shoulder while she was in the hospital and suffering from right-sided shoulder pain with limited range of motion which he shows me today.  She does not recall much about her admission to the hospital.  When I asked her about controlled substances and had noted that according to internal medicine notes she was being taken off all opiates she told me that she still takes hydrocodone.  Looking at Dr. Caroline More note it looks like the last prescription for this medication have been discontinued by him.  Other than her shoulder pain and some neck pain she does not have much in the way of new complaints.  She has renewed her SPAP.   Past Medical History:  Diagnosis Date  . Acute renal failure (ARF) ([place]) 08/17/2017  . Anxiety 04/05/2012  . Bilateral sensorineural hearing loss 06/11/2014   Mild to moderate on the left side and slight to mild on the right side per audiometry 05/2014.  Hearing aides with possible masking of tinnitus recommended but patient wished to defer secondary to finances.  . Blood transfusion without reported diagnosis    pt denies  . Bursitis of right shoulder 07/12/2012   s/p shoulder injection  07/12/2012   . Cataract of right eye   . Constipation due to pain medication 04/27/2010  . Diverticulosis 02/08/2012   Extensive left-sided diverticula on colonoscopy March 2012 per Dr. Gala Romney   . Essential hypertension 07/20/2006  . Genital herpes 07/20/2006  . 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 Heart murmur 1961  . Human immunodeficiency virus disease ([place]) 03/27/1986  . Hyperlipidemia LDL goal < 100 04/05/2012  . Long-term current use of opiate analgesic 03/17/2016  . Lumbar degenerative disc disease 07/20/2006   With chronic back pain   . Marijuana use 07/03/2016  . Micturition syncope 09/20/2015  . 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[place]) 11/01/2011   s/p aortobifem bypass 2009   . Periumbilical hernia 0/11/9831   1 cm left periumbilical abdominal wall defect  . 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 Right rotator cuff tear 02/01/2013   Responds to periodic steroid injections  . Seborrhea 09/01/2010  . Small bowel obstruction due to adhesions ([place]) 02/08/2012   s/p Exploratory laparotomy, lysis of adhesions 02/12/12    . 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 Tobacco abuse 02/19/2012  . Tobacco abuse   . Vasovagal syncope 02/15/2015  . Voiding dysfunction    s/p cystoscopy and meatal dilation Dec 2005    Past Surgical History:  Procedure Laterality Date  . ABDOMINAL HYSTERECTOMY    . AORTO-FEMORAL BYPASS GRAFT  04/2007  . APPENDECTOMY    .  BREAST SURGERY     Breast biopsy: negative  . CHOLECYSTECTOMY    . COLECTOMY  01/2011   Dr. Margot Chimes; "took out 12 inches of small intestiines and removed blockage"  . EYE SURGERY    . LAPAROTOMY  02/12/2012   Procedure: EXPLORATORY LAPAROTOMY;  Surgeon: Stark Klein, MD;  Location: [facility] OR;  Service: General;  Laterality: N/A;  Exploratory Laparotomy, lysis of adhesions  . SMALL INTESTINE SURGERY      Family History  Problem Relation Age of Onset  . Kidney failure Mother   . Diabetes  Mother   . Hypertension Mother   . Heart disease Mother   . Glaucoma Father   . Congestive Heart Failure Sister   . Diabetes Sister   . Kidney disease Sister   . Diabetes Brother   . Unexplained death Brother 2       Automobile accident  . Hypothyroidism Daughter   . Arthritis Daughter        Neck/Back  . Healthy Son   . HIV/AIDS Brother   . HIV Daughter   . Kidney disease Daughter   . Arthritis Son        Knee      Social History   Socioeconomic History  . Marital status: Widowed    Spouse name: Not on file  . Number of children: 4  . Years of education: 2y college  . Highest education level: Not on file  Occupational History  . Occupation: retired    Comment: previously worked as a Designer, fashion/clothing for [employer]  . Financial resource strain: Not on file  . Food insecurity:    Worry: Not on file    Inability: Not on file  . Transportation needs:    Medical: Not on file    Non-medical: Not on file  Tobacco Use  . Smoking status: Current Every Day Smoker    Packs/day: 0.50    Years: 50.00    Pack years: 25.00    Types: Cigarettes  . Smokeless tobacco: Never Used  . Tobacco comment: smoking .5 PPD  Substance and Sexual Activity  . Alcohol use: No    Alcohol/week: 0.0 standard drinks    Comment: "last drink of alcohol ~ 1977"  . Drug use: No    Comment: cutting back on chantix, 1 cigarette this morning  . Sexual activity: Never    Partners: Male  Lifestyle  . Physical activity:    Days per week: Not on file    Minutes per session: Not on file  . Stress: Not on file  Relationships  . Social connections:    Talks on phone: Not on file    Gets together: Not on file    Attends religious service: Not on file    Active member of club or organization: Not on file    Attends meetings of clubs or organizations: Not on file    Relationship status: Not on file  Other Topics Concern  . Not on file  Social History Narrative   Lives alone in [place], [place]     Allergies  Allergen Reactions  . Hctz [Hydrochlorothiazide] Other (See Comments)    Dizziness, syncope; does NOT wish to take anymore     Current Outpatient Medications:  .  alendronate (FOSAMAX) 70 MG tablet, Take 1 tablet (70 mg total) by mouth once a week. (Patient taking differently: Take 70 mg by mouth every Sunday. ), Disp: 12 tablet, Rfl: 3 .  ALPRAZolam Duanne Moron)  1 MG tablet, Take 1 tablet (1 mg total) by mouth at bedtime as needed for anxiety., Disp: 30 tablet, Rfl: 5 .  amLODipine (NORVASC) 5 MG tablet, Take 2 tablets (10 mg total) by mouth daily. (Patient taking differently: Take 5 mg by mouth 2 (two) times daily. ), Disp: 90 tablet, Rfl: 3 .  aspirin 81 MG EC tablet, Take 81 mg by mouth daily.  , Disp: , Rfl:  .  atenolol (TENORMIN) 50 MG tablet, Take 1 tablet (50 mg total) by mouth daily., Disp: 90 tablet, Rfl: 3 .  atorvastatin (LIPITOR) 10 MG tablet, TAKE 1 TABLET BY MOUTH EVERY DAY (STOP LOVASTATIN), Disp: 30 tablet, Rfl: 11 .  benazepril (LOTENSIN) 40 MG tablet, TAKE ONE TABLET BY MOUTH DAILY, Disp: 90 tablet, Rfl: 3 .  bictegravir-emtricitabine-tenofovir AF (BIKTARVY) 50-200-25 MG TABS tablet, Take 1 tablet by mouth daily., Disp: 30 tablet, Rfl: 11 .  calcium-vitamin D (OSCAL WITH D) 500-200 MG-UNIT per tablet, Take 1 tablet by mouth 2 (two) times daily., Disp: , Rfl:  .  COMBIGAN 0.2-0.5 % ophthalmic solution, Place 1 drop into the left eye 2 (two) times daily. , Disp: , Rfl:  .  diclofenac sodium (VOLTAREN) 1 % GEL, Apply 2 g topically 4 (four) times daily. (Patient taking differently: Apply 2 g topically See admin instructions. Apply 2 grams to painful areas of shoulders, arms, stomach, and thighs), Disp: 100 g, Rfl: 3 .  docusate sodium (COLACE) 100 MG capsule, Take 100 mg by mouth 2 (two) times daily., Disp: , Rfl:  .  dorzolamide (TRUSOPT) 2 % ophthalmic solution, Place 1 drop into both eyes 2 (two) times daily., Disp: , Rfl: 5 .  latanoprost (XALATAN) 0.005 %  ophthalmic solution, Place 1 drop into both eyes at bedtime., Disp: , Rfl:  .  Multiple Vitamin (MULTIVITAMIN WITH MINERALS) TABS, Take 1 tablet by mouth daily., Disp: , Rfl:  .  pantoprazole (PROTONIX) 40 MG tablet, TAKE ONE TABLET BY MOUTH EVERY DAY, Disp: 30 tablet, Rfl: 5 .  spironolactone (ALDACTONE) 100 MG tablet, Take 1 tablet (100 mg total) by mouth daily., Disp: 90 tablet, Rfl: 3 .  spironolactone (ALDACTONE) 25 MG tablet, Take 1 tablet (25 mg total) by mouth daily., Disp: 90 tablet, Rfl: 3 .  terazosin (HYTRIN) 5 MG capsule, Take 1 capsule (5 mg total) by mouth at bedtime., Disp: 90 capsule, Rfl: 3        Review of Systems  Constitutional: Negative for activity change, appetite change, chills, diaphoresis, fatigue and unexpected weight change.  HENT: Negative for congestion, ear pain, rhinorrhea, sinus pressure, sneezing, sore throat and trouble swallowing.   Eyes: Negative for photophobia and visual disturbance.  Respiratory: Negative for cough, chest tightness, shortness of breath, wheezing and stridor.   Cardiovascular: Negative for palpitations.  Gastrointestinal: Negative for abdominal distention, anal bleeding, blood in stool, constipation, diarrhea, nausea and vomiting.  Genitourinary: Negative for difficulty urinating, flank pain and hematuria.  Musculoskeletal: Positive for arthralgias and myalgias. Negative for gait problem and joint swelling.  Skin: Negative for color change, pallor and wound.  Neurological: Negative for dizziness, tremors and light-headedness.  Hematological: Negative for adenopathy. Does not bruise/bleed easily.  Psychiatric/Behavioral: Negative for agitation, behavioral problems, confusion, decreased concentration, dysphoric mood and sleep disturbance.        Objective:   Physical Exam  Constitutional: She is oriented to person, place, and time. She appears well-developed and well-nourished. No distress.  HENT:  Head: Normocephalic and  atraumatic.  Mouth/Throat: No  oropharyngeal exudate.  Eyes: Conjunctivae and EOM are normal. No scleral icterus.  Neck: Normal range of motion. Neck supple.  Cardiovascular: Normal rate and regular rhythm.  Pulmonary/Chest: Effort normal. No respiratory distress. She has no wheezes.  Abdominal: She exhibits no distension.  Musculoskeletal: She exhibits no edema.       Right shoulder: She exhibits decreased range of motion and tenderness.  Neurological: She is alert and oriented to person, place, and time. She exhibits normal muscle tone. Coordination normal.  Skin: Skin is warm and dry. No rash noted. She is not diaphoretic. No erythema. No pallor.  Psychiatric: She has a normal mood and affect. Her behavior is normal. Judgment and thought content normal.  Nursing note and vitals reviewed.         Assessment & Plan:     HIV: Continue BIKTARVY  Renew SPAP RTC in January when time for SPAP renewal  AVOID TAKING CALCIUm, mag, MVI at same time as HIV meds UNLESS she takes a full meal   HTN being managed by PCP    Hyperlipidemia: Continue Lipitor   Shoulder pain: I ordered plain films for now  Myalgia;s largely shoulder: CPK was up last few times checked. WIll check again though it is also often up in people living with HIV esp those on INSTI  Encephalopathy with urinary retention: I worry about opiate use playing a role. Her daughter (who passed away had prior sig problems with opiate misuse) Hopefully she can truly come off of hydrocodone. I told her these types of drugs can themselves cause urinary retention

## 2017-12-06 NOTE — Telephone Encounter (Signed)
The hydrocodone has been discontinued at the last hospital admission secondary to severe side-effects.  It will not be restarted.

## 2017-12-06 NOTE — Telephone Encounter (Signed)
Requesting a refill on Vicodin. Please call pt back.

## 2017-12-06 NOTE — Telephone Encounter (Signed)
Called pt and gave her dr Caroline More answer to request, pt stated "im sure if he knew how I hurt he would change his mind but I understand, huummm thank you" informed pt that we hoped she felt better soon and to call for any questions or requests. Call ended

## 2017-12-08 LAB — CK: CK TOTAL: 27 U/L — AB (ref 29–143)

## 2017-12-08 LAB — COMPLETE METABOLIC PANEL WITH GFR
AG RATIO: 1.4 (calc) (ref 1.0–2.5)
ALT: 8 U/L (ref 6–29)
AST: 13 U/L (ref 10–35)
Albumin: 4.2 g/dL (ref 3.6–5.1)
Alkaline phosphatase (APISO): 54 U/L (ref 33–130)
BILIRUBIN TOTAL: 0.3 mg/dL (ref 0.2–1.2)
BUN/Creatinine Ratio: 25 (calc) — ABNORMAL HIGH (ref 6–22)
BUN: 26 mg/dL — ABNORMAL HIGH (ref 7–25)
CHLORIDE: 110 mmol/L (ref 98–110)
CO2: 21 mmol/L (ref 20–32)
Calcium: 10 mg/dL (ref 8.6–10.4)
Creat: 1.05 mg/dL — ABNORMAL HIGH (ref 0.60–0.93)
GFR, Est African American: 60 mL/min/{1.73_m2} (ref >=60)
GFR, Est Non African American: 52 mL/min/{1.73_m2} — ABNORMAL LOW (ref >=60)
Globulin: 3 g/dL (calc) (ref 1.9–3.7)
Glucose, Bld: 97 mg/dL (ref 65–99)
POTASSIUM: 4.2 mmol/L (ref 3.5–5.3)
Sodium: 138 mmol/L (ref 135–146)
Total Protein: 7.2 g/dL (ref 6.1–8.1)

## 2017-12-08 LAB — HIV RNA, RTPCR W/R GT (RTI, PI,INT)
HIV 1 RNA QUANT: DETECTED {copies}/mL
HIV-1 RNA QUANT, LOG: DETECTED {Log_copies}/mL

## 2018-01-01 ENCOUNTER — Telehealth: Payer: Self-pay

## 2018-01-01 NOTE — Telephone Encounter (Signed)
[facility]ica [name] with [name] requesting a refill on hydralazine 50mg  to be filled.

## 2018-01-03 NOTE — Telephone Encounter (Signed)
Hydralazine is not on current med list; ? Discontinued 11/14/17.

## 2018-01-04 ENCOUNTER — Other Ambulatory Visit: Payer: Self-pay

## 2018-01-04 ENCOUNTER — Observation Stay (HOSPITAL_COMMUNITY): Payer: Medicare HMO

## 2018-01-04 ENCOUNTER — Encounter (HOSPITAL_COMMUNITY): Payer: Self-pay | Admitting: *Deleted

## 2018-01-04 ENCOUNTER — Ambulatory Visit (HOSPITAL_COMMUNITY)
Admission: RE | Admit: 2018-01-04 | Discharge: 2018-01-04 | Disposition: A | Discharge status: Home / Self Care | Payer: Medicare HMO | Source: Ambulatory Visit | Attending: Family Medicine | Admitting: Family Medicine

## 2018-01-04 ENCOUNTER — Observation Stay (HOSPITAL_COMMUNITY)
Admission: EM | Admit: 2018-01-04 | Discharge: 2018-01-06 | Disposition: A | Discharge status: Home / Self Care | Payer: Medicare HMO | Attending: Emergency Medicine | Admitting: Emergency Medicine

## 2018-01-04 ENCOUNTER — Ambulatory Visit (INDEPENDENT_AMBULATORY_CARE_PROVIDER_SITE_OTHER): Payer: Medicare HMO | Admitting: Internal Medicine

## 2018-01-04 VITALS — BP 91/62 | HR 44 | Temp 97.3°F

## 2018-01-04 DIAGNOSIS — N390 Urinary tract infection, site not specified: Secondary | ICD-10-CM | POA: Insufficient documentation

## 2018-01-04 DIAGNOSIS — F17210 Nicotine dependence, cigarettes, uncomplicated: Secondary | ICD-10-CM | POA: Insufficient documentation

## 2018-01-04 DIAGNOSIS — Z7983 Long term (current) use of bisphosphonates: Secondary | ICD-10-CM | POA: Diagnosis not present

## 2018-01-04 DIAGNOSIS — T68XXXA Hypothermia, initial encounter: Secondary | ICD-10-CM | POA: Diagnosis not present

## 2018-01-04 DIAGNOSIS — N179 Acute kidney failure, unspecified: Secondary | ICD-10-CM | POA: Diagnosis not present

## 2018-01-04 DIAGNOSIS — I952 Hypotension due to drugs: Secondary | ICD-10-CM | POA: Diagnosis not present

## 2018-01-04 DIAGNOSIS — I10 Essential (primary) hypertension: Secondary | ICD-10-CM | POA: Insufficient documentation

## 2018-01-04 DIAGNOSIS — H5316 Psychophysical visual disturbances: Secondary | ICD-10-CM | POA: Insufficient documentation

## 2018-01-04 DIAGNOSIS — F1120 Opioid dependence, uncomplicated: Secondary | ICD-10-CM | POA: Insufficient documentation

## 2018-01-04 DIAGNOSIS — E875 Hyperkalemia: Secondary | ICD-10-CM | POA: Insufficient documentation

## 2018-01-04 DIAGNOSIS — R0989 Other specified symptoms and signs involving the circulatory and respiratory systems: Secondary | ICD-10-CM | POA: Diagnosis not present

## 2018-01-04 DIAGNOSIS — X58XXXA Exposure to other specified factors, initial encounter: Secondary | ICD-10-CM | POA: Diagnosis not present

## 2018-01-04 DIAGNOSIS — Z9181 History of falling: Secondary | ICD-10-CM | POA: Diagnosis not present

## 2018-01-04 DIAGNOSIS — B20 Human immunodeficiency virus [HIV] disease: Secondary | ICD-10-CM | POA: Insufficient documentation

## 2018-01-04 DIAGNOSIS — F1320 Sedative, hypnotic or anxiolytic dependence, uncomplicated: Secondary | ICD-10-CM | POA: Diagnosis not present

## 2018-01-04 DIAGNOSIS — Z79899 Other long term (current) drug therapy: Secondary | ICD-10-CM | POA: Insufficient documentation

## 2018-01-04 DIAGNOSIS — G8929 Other chronic pain: Secondary | ICD-10-CM | POA: Insufficient documentation

## 2018-01-04 DIAGNOSIS — B965 Pseudomonas (aeruginosa) (mallei) (pseudomallei) as the cause of diseases classified elsewhere: Secondary | ICD-10-CM | POA: Diagnosis not present

## 2018-01-04 DIAGNOSIS — N19 Unspecified kidney failure: Secondary | ICD-10-CM

## 2018-01-04 DIAGNOSIS — R001 Bradycardia, unspecified: Secondary | ICD-10-CM | POA: Insufficient documentation

## 2018-01-04 DIAGNOSIS — T50905A Adverse effect of unspecified drugs, medicaments and biological substances, initial encounter: Secondary | ICD-10-CM

## 2018-01-04 DIAGNOSIS — I739 Peripheral vascular disease, unspecified: Secondary | ICD-10-CM | POA: Diagnosis not present

## 2018-01-04 DIAGNOSIS — R2689 Other abnormalities of gait and mobility: Secondary | ICD-10-CM | POA: Insufficient documentation

## 2018-01-04 DIAGNOSIS — D539 Nutritional anemia, unspecified: Secondary | ICD-10-CM | POA: Insufficient documentation

## 2018-01-04 DIAGNOSIS — Z7982 Long term (current) use of aspirin: Secondary | ICD-10-CM | POA: Diagnosis not present

## 2018-01-04 DIAGNOSIS — R55 Syncope and collapse: Principal | ICD-10-CM | POA: Diagnosis present

## 2018-01-04 DIAGNOSIS — R69 Illness, unspecified: Secondary | ICD-10-CM | POA: Diagnosis not present

## 2018-01-04 DIAGNOSIS — I959 Hypotension, unspecified: Secondary | ICD-10-CM | POA: Insufficient documentation

## 2018-01-04 LAB — CBC WITH DIFFERENTIAL/PLATELET
Abs Immature Granulocytes: 0.04 10*3/uL (ref 0.00–0.07)
Basophils Absolute: 0 10*3/uL (ref 0.0–0.1)
Basophils Relative: 0 %
EOS ABS: 0.2 10*3/uL (ref 0.0–0.5)
EOS PCT: 3 %
HEMATOCRIT: 33.6 % — AB (ref 36.0–46.0)
Hemoglobin: 10.2 g/dL — ABNORMAL LOW (ref 12.0–15.0)
Immature Granulocytes: 1 %
LYMPHS ABS: 2.7 10*3/uL (ref 0.7–4.0)
Lymphocytes Relative: 32 %
MCH: 31.3 pg (ref 26.0–34.0)
MCHC: 30.4 g/dL (ref 30.0–36.0)
MCV: 103.1 fL — AB (ref 80.0–100.0)
MONOS PCT: 4 %
Monocytes Absolute: 0.3 10*3/uL (ref 0.1–1.0)
Neutro Abs: 5.2 10*3/uL (ref 1.7–7.7)
Neutrophils Relative %: 60 %
Platelets: 310 10*3/uL (ref 150–400)
RBC: 3.26 MIL/uL — ABNORMAL LOW (ref 3.87–5.11)
RDW: 13.8 % (ref 11.5–15.5)
WBC: 8.4 10*3/uL (ref 4.0–10.5)
nRBC: 0 % (ref 0.0–0.2)

## 2018-01-04 LAB — BASIC METABOLIC PANEL
ANION GAP: 14 (ref 5–15)
BUN: 59 mg/dL — ABNORMAL HIGH (ref 8–23)
CO2: 14 mmol/L — ABNORMAL LOW (ref 22–32)
Calcium: 9.9 mg/dL (ref 8.9–10.3)
Chloride: 111 mmol/L (ref 98–111)
Creatinine, Ser: 2.83 mg/dL — ABNORMAL HIGH (ref 0.44–1.00)
GFR calc Af Amer: 18 mL/min — ABNORMAL LOW (ref >60)
GFR, EST NON AFRICAN AMERICAN: 15 mL/min — AB (ref >60)
GLUCOSE: 94 mg/dL (ref 70–99)
POTASSIUM: 5 mmol/L (ref 3.5–5.1)
Sodium: 139 mmol/L (ref 135–145)

## 2018-01-04 LAB — IRON AND TIBC
Iron: 71 ug/dL (ref 28–170)
Saturation Ratios: 22 % (ref 10.4–31.8)
TIBC: 322 ug/dL (ref 250–450)
UIBC: 251 ug/dL

## 2018-01-04 LAB — COMPREHENSIVE METABOLIC PANEL
ALBUMIN: 3.6 g/dL (ref 3.5–5.0)
ALK PHOS: 47 U/L (ref 38–126)
ALT: 54 U/L — AB (ref 0–44)
AST: 123 U/L — ABNORMAL HIGH (ref 15–41)
Anion gap: 12 (ref 5–15)
BILIRUBIN TOTAL: 0.6 mg/dL (ref 0.3–1.2)
BUN: 67 mg/dL — AB (ref 8–23)
CO2: 16 mmol/L — ABNORMAL LOW (ref 22–32)
Calcium: 9.4 mg/dL (ref 8.9–10.3)
Chloride: 109 mmol/L (ref 98–111)
Creatinine, Ser: 3.78 mg/dL — ABNORMAL HIGH (ref 0.44–1.00)
GFR calc Af Amer: 12 mL/min — ABNORMAL LOW (ref >60)
GFR calc non Af Amer: 11 mL/min — ABNORMAL LOW (ref >60)
Glucose, Bld: 111 mg/dL — ABNORMAL HIGH (ref 70–99)
POTASSIUM: 5.6 mmol/L — AB (ref 3.5–5.1)
Sodium: 137 mmol/L (ref 135–145)
TOTAL PROTEIN: 7.4 g/dL (ref 6.5–8.1)

## 2018-01-04 LAB — RETICULOCYTES
Immature Retic Fract: 10.5 % (ref 2.3–15.9)
RBC.: 3.79 MIL/uL — AB (ref 3.87–5.11)
Retic Count, Absolute: 80.3 10*3/uL (ref 19.0–186.0)
Retic Ct Pct: 2.1 % (ref 0.4–3.1)

## 2018-01-04 LAB — OCCULT BLOOD X 1 CARD TO LAB, STOOL: Fecal Occult Bld: NEGATIVE

## 2018-01-04 LAB — I-STAT CG4 LACTIC ACID, ED: LACTIC ACID, VENOUS: 0.84 mmol/L (ref 0.5–1.9)

## 2018-01-04 LAB — VITAMIN B12: Vitamin B-12: 363 pg/mL (ref 180–914)

## 2018-01-04 LAB — FERRITIN: FERRITIN: 104 ng/mL (ref 11–307)

## 2018-01-04 MED ORDER — TRAMADOL HCL 50 MG PO TABS
50.0000 mg | ORAL_TABLET | Freq: Two times a day (BID) | ORAL | Status: DC | PRN
  Administered 2018-01-04: 50 mg via ORAL
  Filled 2018-01-04 (×2): qty 1

## 2018-01-04 MED ORDER — ENOXAPARIN SODIUM 40 MG/0.4ML ~~LOC~~ SOLN
40.0000 mg | SUBCUTANEOUS | Status: DC
  Filled 2018-01-04: qty 0.4

## 2018-01-04 MED ORDER — SODIUM CHLORIDE 0.9 % IV SOLN
INTRAVENOUS | Status: DC
  Administered 2018-01-04: 11:00:00 via INTRAVENOUS

## 2018-01-04 MED ORDER — ATORVASTATIN CALCIUM 10 MG PO TABS
10.0000 mg | ORAL_TABLET | Freq: Every day | ORAL | Status: DC
  Administered 2018-01-05: 10 mg via ORAL
  Filled 2018-01-04: qty 1

## 2018-01-04 MED ORDER — ALPRAZOLAM 0.5 MG PO TABS
1.0000 mg | ORAL_TABLET | Freq: Every evening | ORAL | Status: DC | PRN
  Administered 2018-01-04 – 2018-01-05 (×2): 1 mg via ORAL
  Filled 2018-01-04 (×2): qty 2

## 2018-01-04 MED ORDER — SENNA 8.6 MG PO TABS
1.0000 | ORAL_TABLET | Freq: Two times a day (BID) | ORAL | Status: DC
  Administered 2018-01-06: 8.6 mg via ORAL
  Filled 2018-01-04: qty 1

## 2018-01-04 MED ORDER — BRIMONIDINE TARTRATE-TIMOLOL 0.2-0.5 % OP SOLN
1.0000 [drp] | Freq: Two times a day (BID) | OPHTHALMIC | Status: DC
  Filled 2018-01-04: qty 5

## 2018-01-04 MED ORDER — DORZOLAMIDE HCL 2 % OP SOLN
1.0000 [drp] | Freq: Two times a day (BID) | OPHTHALMIC | Status: DC
  Administered 2018-01-04 – 2018-01-06 (×4): 1 [drp] via OPHTHALMIC
  Filled 2018-01-04 (×2): qty 10

## 2018-01-04 MED ORDER — TIMOLOL MALEATE 0.5 % OP SOLN
1.0000 [drp] | Freq: Two times a day (BID) | OPHTHALMIC | Status: DC
  Administered 2018-01-04 – 2018-01-06 (×4): 1 [drp] via OPHTHALMIC
  Filled 2018-01-04 (×2): qty 5

## 2018-01-04 MED ORDER — ASPIRIN EC 81 MG PO TBEC: 81.0000 mg | DELAYED_RELEASE_TABLET | Freq: Every day | ORAL | Status: DC

## 2018-01-04 MED ORDER — SODIUM CHLORIDE 0.9 % IV SOLN
INTRAVENOUS | Status: AC
  Administered 2018-01-04 (×2): via INTRAVENOUS

## 2018-01-04 MED ORDER — BRIMONIDINE TARTRATE 0.2 % OP SOLN
1.0000 [drp] | Freq: Two times a day (BID) | OPHTHALMIC | Status: DC
  Administered 2018-01-04 – 2018-01-06 (×4): 1 [drp] via OPHTHALMIC
  Filled 2018-01-04 (×2): qty 5

## 2018-01-04 MED ORDER — ACETAMINOPHEN 650 MG RE SUPP: 650.0000 mg | Freq: Four times a day (QID) | RECTAL | Status: DC | PRN

## 2018-01-04 MED ORDER — BICTEGRAVIR-EMTRICITAB-TENOFOV 50-200-25 MG PO TABS
1.0000 | ORAL_TABLET | Freq: Every day | ORAL | Status: DC
  Administered 2018-01-05 – 2018-01-06 (×2): 1 via ORAL
  Filled 2018-01-04 (×2): qty 1

## 2018-01-04 MED ORDER — ASPIRIN 81 MG PO TBEC: 81.0000 mg | DELAYED_RELEASE_TABLET | Freq: Every day | ORAL | Status: DC

## 2018-01-04 MED ORDER — ACETAMINOPHEN 325 MG PO TABS
650.0000 mg | ORAL_TABLET | Freq: Four times a day (QID) | ORAL | Status: DC | PRN
  Administered 2018-01-04 – 2018-01-05 (×3): 650 mg via ORAL
  Filled 2018-01-04 (×3): qty 2

## 2018-01-04 MED ORDER — CALCIUM GLUCONATE 10 % IV SOLN
1.0000 g | Freq: Once | INTRAVENOUS | Status: AC
  Administered 2018-01-04: 1 g via INTRAVENOUS
  Filled 2018-01-04: qty 10

## 2018-01-04 MED ORDER — LATANOPROST 0.005 % OP SOLN
1.0000 [drp] | Freq: Every day | OPHTHALMIC | Status: DC
  Administered 2018-01-04 – 2018-01-05 (×2): 1 [drp] via OPHTHALMIC
  Filled 2018-01-04 (×2): qty 2.5

## 2018-01-04 MED ORDER — ENOXAPARIN SODIUM 30 MG/0.3ML ~~LOC~~ SOLN
30.0000 mg | SUBCUTANEOUS | Status: DC
  Administered 2018-01-05: 30 mg via SUBCUTANEOUS
  Filled 2018-01-04 (×2): qty 0.3

## 2018-01-04 MED ORDER — NICOTINE 14 MG/24HR TD PT24
14.0000 mg | MEDICATED_PATCH | Freq: Every day | TRANSDERMAL | Status: DC
  Administered 2018-01-04 – 2018-01-06 (×3): 14 mg via TRANSDERMAL
  Filled 2018-01-04 (×4): qty 1

## 2018-01-04 MED ORDER — SODIUM POLYSTYRENE SULFONATE 15 GM/60ML PO SUSP
30.0000 g | Freq: Once | ORAL | Status: AC
  Administered 2018-01-04: 30 g via ORAL
  Filled 2018-01-04: qty 120

## 2018-01-04 NOTE — ED Triage Notes (Signed)
Pt in from the internal medicine clinic after a week of n/v and falling at home, they started an IV and gave her fluids, sent to r/o sepsis

## 2018-01-04 NOTE — Assessment & Plan Note (Signed)
>>  ASSESSMENT AND PLAN FOR POLYPHARMACY WRITTEN ON 01/04/2018  3:14 PM BY Doneen Poisson, MD  Assessment  She is on multiple medications for her multiple chronic comorbidities.  That said, unfortunately she takes medications in ways that are not always consistent with how they are prescribed or that have been previously discontinued.  In addition, she has been found on urine drug screens, to have substances that she was never prescribed and that her pharmacy, when called, say they never dispensed.  Thus, she is receiving medications from other sources and these are likely resulting in some of her side effects including the bradycardia, hypotension, near syncope, and urinary retention.  Plan  We will try to trim down her medication list to those that are absolutely necessary given the risk for taking them in ways other than as prescribed and potentially causing harm.  We will stress the importance of only taking medications that are prescribed and to not take medications that she does not have a prescription for.  We will ask, that she discard any other medication she may have that are not on her medication list upon discharge.  I believe this particular problem has led to several of her recent admissions and getting a handle of this problem will be crucial to help her avoid future similar admissions.

## 2018-01-04 NOTE — Assessment & Plan Note (Signed)
Assessment  Although we had initially thought that the hypotension was related to sepsis it was subsequently discovered that she continued to take her hydralazine and for some reason had doubled her atenolol from 50 to 100 mg.  She also had taken the rest of her antihypertensive regimen.  Thus, although sepsis remains in the differential, it now appears that this may be iatrogenic hypotension related to the continuance of medications that were stopped, as well as unexplained doubling of an antihypertensive medication.  Plan  She is admitted to the general medicine teaching service.  Because a telemetry bed was not immediately available she was sent to the emergency department for closer monitoring.  We are holding her antihypertensives and will stress the importance of stopping the beta-blocker and hydralazine moving forward.  If necessary, we will slowly reintroduce her other antihypertensives.  She has been provided a true venous fluid resuscitation to improve her blood pressure.

## 2018-01-04 NOTE — Progress Notes (Signed)
   Subjective:    Patient ID: Jessica [name], female    DOB: 07-29-1940, 77 y.o.   MRN: [id number]  HPI  Jessica [name] is here for follow-up of her chronic pain, essential hypertension, generalized anxiety disorder, and insomnia. Please see the A&P for the status of the pt's chronic medical problems.  Over the last 3 weeks she has had near syncopal episodes characterized by excessive lightheadedness.  She fell approximately 1 week ago during such an episode, but denies any loss of consciousness or hitting of her head.  The medications that she is taking are not the same as the medications that are prescribed.  It appears, she has doubled her dose of atenolol and continues to take the hydralazine despite it being discontinued at the last hospitalization.  She also notes significant pain in her inner thighs bilaterally, right posterior shoulder pain and neck pain. This has worsened despite the Voltaren gel and as needed ibuprofen.  Her daughter has also been providing her with hydrocodone even though this had been stopped and the pharmacy was instructed to provide no further prescriptions.  Upon presentation she was noted to be hypotensive at 88/38 with a pulse of 48.  Her initial temperature was 94 orally and an axillary temperature was 93.7.  Initially, she was felt to possibly be septic and an IV was started immediately, blood was drawn for basic labs and blood cultures, and IV fluids were started.  Arrangements were also made for admission and transfer to the emergency department as a telemetry bed was not immediately available.  After initiation of the IV fluids a repeat oral temperature was 97 and her systolic blood pressure had increased slightly to 91.  Review of Systems  Constitutional: Positive for chills. Negative for activity change, appetite change and fever.  Respiratory: Negative for cough, chest tightness and shortness of breath.   Cardiovascular: Negative for chest  pain, palpitations and leg swelling.  Gastrointestinal: Negative for abdominal distention, abdominal pain, constipation, diarrhea, nausea and vomiting.  Genitourinary: Positive for frequency. Negative for difficulty urinating, dysuria, flank pain and urgency.  Musculoskeletal: Positive for arthralgias, myalgias and neck pain. Negative for joint swelling.  Neurological: Positive for light-headedness. Negative for syncope.      Objective:   Physical Exam  General: Thin woman sitting comfortably in a wheelchair in no acute distress.  She is alert and oriented x3 and communicates without difficulty, occasionally cracking a joke and smiling. Lungs: Clear to auscultation bilaterally without wheezes, rhonchi, or rales. Heart: Regular rate and rhythm without murmurs, rubs, or gallops. Abdomen: Soft, without rebound. Extremities: Trace pitting edema symmetrically.     Assessment & Plan:   Please see problem based charting.

## 2018-01-04 NOTE — ED Notes (Signed)
Pt on bedside commode.

## 2018-01-04 NOTE — ED Notes (Signed)
IM at bedside.

## 2018-01-04 NOTE — ED Triage Notes (Signed)
Pt had lab work obtained down in medicine clinic, will wait for further evaluation before ordering more

## 2018-01-04 NOTE — H&P (Addendum)
Date: 01/04/2018               Patient Name:  Jessica [name] MRN: [id number]  DOB: 20-Feb-1941 Age / Sex: 77 y.o., female   PCP: Oval Linsey, MD         Medical Service: Internal Medicine Teaching Service         Attending Physician: Dr. Aldine Contes, MD    First Contact: Dr. Laural Golden Pager: [phone]  Second Contact: Dr. Heber  Pager: [phone]       After Hours (After 5p/  First Contact Pager: [phone]  weekends / holidays): Second Contact Pager: [phone]   Chief Complaint: Dizziness and generalized weakness.  History of Present Illness: Jessica [name] is a 77 y.o.lady with PMHx significant for HIV (currently compliant on Biktarvy;  ,HTN, PVD s/p aortobifem bypass in 2009, opioid & benzo dependence, and tobacco abuse presented to [facility] with complaint of dizziness and generalized weakness.  According to patient and her daughter she is experiencing intermittent nausea and vomiting with good appetite, becoming intermittently dizzy, having some gait imbalance due to dizziness which she describes more like lightheadedness, denies any fall, presented to clinic with this persistent lightheadedness and generalized weakness and thinking that she is about to pass out.  She was found to be hypothermic, bradycardic and hypotensive and was admitted for further management.  Chart review patient is having difficulty with managing her medications. Her daughter is her caregiver and she makes her pillbox, according to her daughter report she took hydralazine 50 mg, spironolactone 125 mg, atenolol 100 mg, benazepril 40 mg and amlodipine 10 mg in the morning.  Per chart review her hydralazine was discontinued during previous hospitalization in September because of urinary retention, she was supposed to take atenolol 50 mg daily but according to daughter's report she was taking atenolol 100 mg. He had a recent admission due to urinary retention and hyperkalemia and her opioids and  hydralazine were discontinued. Patient reports no difficulty with urination. She denies any recent illnesses, upper respiratory symptoms, cough, fever or chills.  She denies any diarrhea, to experience intermittent nausea and vomiting for the past 2 to 3 weeks, 1-2 vomitus per week.  Her vitals responded well with some IV fluid, she was sent to ED as there was no telemetry bed available.  She was also found to have mild hyperkalemia at 5.6.  Macrocytic anemia with hemoglobin of 10.2.  Meds:  Current Facility-Administered Medications for the 01/04/18 encounter [facility] Encounter)  Medication  . 0.9 %  sodium chloride infusion   Current Meds  Medication Sig  . alendronate (FOSAMAX) 70 MG tablet Take 1 tablet (70 mg total) by mouth once a week. (Patient taking differently: Take 70 mg by mouth every Sunday. )  . ALPRAZolam (XANAX) 1 MG tablet Take 1 tablet (1 mg total) by mouth at bedtime as needed for anxiety.  Marland Kitchen amLODipine (NORVASC) 5 MG tablet Take 2 tablets (10 mg total) by mouth daily. (Patient taking differently: Take 5 mg by mouth 2 (two) times daily. )  . aspirin 81 MG EC tablet Take 81 mg by mouth daily.    Marland Kitchen atenolol (TENORMIN) 50 MG tablet Take 1 tablet (50 mg total) by mouth daily.  Marland Kitchen atorvastatin (LIPITOR) 10 MG tablet TAKE 1 TABLET BY MOUTH EVERY DAY (STOP LOVASTATIN) (Patient taking differently: Take 10 mg by mouth daily. TAKE 1 TABLET BY MOUTH EVERY DAY (STOP LOVASTATIN))  . benazepril (LOTENSIN) 40 MG tablet TAKE ONE  TABLET BY MOUTH DAILY (Patient taking differently: Take 40 mg by mouth daily. )  . bictegravir-emtricitabine-tenofovir AF (BIKTARVY) 50-200-25 MG TABS tablet Take 1 tablet by mouth daily.  . calcium-vitamin D (OSCAL WITH D) 500-200 MG-UNIT per tablet Take 1 tablet by mouth 2 (two) times daily.  . COMBIGAN 0.2-0.5 % ophthalmic solution Place 1 drop into the left eye 2 (two) times daily.   . diclofenac sodium (VOLTAREN) 1 % GEL Apply 2 g topically 4 (four) times  daily. (Patient taking differently: Apply 2 g topically See admin instructions. Apply 2 grams to painful areas of shoulders, arms, stomach, and thighs)  . docusate sodium (COLACE) 100 MG capsule Take 100 mg by mouth 2 (two) times daily.  . dorzolamide (TRUSOPT) 2 % ophthalmic solution Place 1 drop into both eyes 2 (two) times daily.  Marland Kitchen latanoprost (XALATAN) 0.005 % ophthalmic solution Place 1 drop into both eyes at bedtime.  . Multiple Vitamin (MULTIVITAMIN WITH MINERALS) TABS Take 1 tablet by mouth daily.  . pantoprazole (PROTONIX) 40 MG tablet TAKE ONE TABLET BY MOUTH EVERY DAY (Patient taking differently: Take 40 mg by mouth daily. )  . spironolactone (ALDACTONE) 100 MG tablet Take 1 tablet (100 mg total) by mouth daily.  Marland Kitchen terazosin (HYTRIN) 5 MG capsule Take 1 capsule (5 mg total) by mouth at bedtime.  . triamcinolone ointment (KENALOG) 0.1 % Apply 1 application topically 2 (two) times daily as needed (rash).     Allergies: Allergies as of 01/04/2018 - Review Complete 01/04/2018  Allergen Reaction Noted  . Hctz [hydrochlorothiazide] Other (See Comments) 09/05/2013   Past Medical History:  Diagnosis Date  . Acute renal failure (ARF) ([place]) 08/17/2017  . Anxiety 04/05/2012  . Bilateral sensorineural hearing loss 06/11/2014   Mild to moderate on the left side and slight to mild on the right side per audiometry 05/2014.  Hearing aides with possible masking of tinnitus recommended but patient wished to defer secondary to finances.  . Blood transfusion without reported diagnosis    pt denies  . Bursitis of right shoulder 07/12/2012   s/p shoulder injection 07/12/2012   . Cataract of right eye   . Constipation due to pain medication 04/27/2010  . Diverticulosis 02/08/2012   Extensive left-sided diverticula on colonoscopy March 2012 per Dr. Gala Romney   . Essential hypertension 07/20/2006  . Genital herpes 07/20/2006  . 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 Heart murmur 1961  . Human immunodeficiency virus  disease ([place]) 03/27/1986  . Hyperlipidemia LDL goal < 100 04/05/2012  . Long-term current use of opiate analgesic 03/17/2016  . Lumbar degenerative disc disease 07/20/2006   With chronic back pain   . Marijuana use 07/03/2016  . Micturition syncope 09/20/2015  . 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[place]) 11/01/2011   s/p aortobifem bypass 2009   . Periumbilical hernia 03/15/7515   1 cm left periumbilical abdominal wall defect  . 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 Right rotator cuff tear 02/01/2013   Responds to periodic steroid injections  . Seborrhea 09/01/2010  . Shoulder pain, right 12/04/2017  . Small bowel obstruction due to adhesions ([place]) 02/08/2012   s/p Exploratory laparotomy, lysis of adhesions 02/12/12    . 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 Tobacco abuse 02/19/2012  . Tobacco abuse   . Vasovagal syncope 02/15/2015  . Voiding dysfunction    s/p cystoscopy and meatal dilation Dec 2005    Family History:  Multiple family members with hypertension.  Social History: Lives with her daughter who is her primary caretaker. Smokes half a pack per day, uses marijuana daily, denies any alcohol intake.  Review of Systems: A complete ROS was negative except as per HPI.   Physical Exam: Blood pressure (!) 97/58, pulse (!) 52, temperature (!) 97.4 F (36.3 C), temperature source Oral, resp. rate 16, SpO2 97 %. Vitals:   01/04/18 1310 01/04/18 1330  BP: (!) 101/59 (!) 97/58  Pulse: (!) 49 (!) 52  Resp: 15 16  Temp: (!) 97.4 F (36.3 C)   TempSrc: Oral   SpO2: 100% 97%   General: Vital signs reviewed.  Patient is well-developed and well-nourished, in no acute distress and cooperative with exam.  Head: Normocephalic and atraumatic. Eyes: EOMI, conjunctivae normal, no scleral icterus.  Neck: Supple, trachea midline, normal ROM, no JVD, masses, thyromegaly, or carotid bruit present.  Cardiovascular: Bradycardia with  normal rhythm, no murmurs, gallops, or rubs. Pulmonary/Chest: Clear to auscultation bilaterally, no wheezes, rales, or rhonchi. Abdominal: Soft, non-tender, non-distended, BS +,  Extremities: No lower extremity edema bilaterally,  pulses symmetric and intact bilaterally. No cyanosis or clubbing. Neurological: A&O x3, Strength is normal and symmetric bilaterally, cranial nerve II-XII are grossly intact, no focal motor deficit, sensory intact to light touch bilaterally.  Skin: Warm, dry and intact. No rashes or erythema. Psychiatric: Normal mood and affect. speech and behavior is normal. Cognition and memory are normal.  EKG: personally reviewed my interpretation is sinus bradycardia with prominent T waves.  CXR: personally reviewed my interpretation is no acute abnormality.  Assessment & Plan by Problem:  Jessica [name] is a 77 y.o.lady with PMHx significant for HIV (currently compliant on Biktarvy,HTN, PVD s/p aortobifem bypass in 2009, opioid & benzo dependence, and tobacco abuse presented to [facility] with complaint of dizziness and generalized weakness.  Active Problems:   Pre-syncope Most likely secondary to polypharmacy and medication mismanagement.  Her daughter handles her medications, will need more education as she was taking meds which has been discontinued. She might get benefit with a home health nursing care for medication management. No sign of obvious infection and her vitals improved with fluid, no need for antibiotics at this time. -Checking blood cultures and UA. -PT OT evaluation. -Checking UDS. -Need extensive education regarding medication management.  HTN.  Patient currently with softer blood pressure and already took a lot of antihypertensives this morning. Once blood pressure improves, we will discontinue her atenolol, Spironolactone and hydralazine. Benazepril and amlodipine can be added from tomorrow according to her blood pressure. She will need a close  monitoring for further management of her blood pressure.  Hyperkalemia.  Patient was taking Spironolactone 125 mg and benazepril 40 mg daily. We will discontinue Spironolactone at this time. -Give her 1 dose of calcium gluconate and Kayexalate. -Monitor BMP.  AKI.  She had her creatinine of 3.78 with normal baseline, she does has creatinine elevation during her previous hospitalization up to 4.8 in September 2019.  She appears dehydrated and BUN/creatinine ratio is prerenal.  Denies any urinary retention, history of recent admission due to urinary retention. -Continue IV normal saline at 100 mL/h for another 10 hours. -Repeat  BMP in the morning. -Renal ultrasound and postvoid bladder scan.  Bradycardia.  She was taking atenolol 100 mg daily, prescription was for 50 mg daily. -Holding atenolol and monitoring.  Macrocytic anemia.  She has a drop in her hemoglobin from 13.2  46-month ago to 10.2 today  with macrocytosis.  She had her vitamin B12 level checked in September which was 209 with normal MMA. -Rechecking anemia panel which include iron studies, ferritin, B12, RBC folate and MMA. -Also checking FOBT. -Repeat CBC in the morning.  Chronic pain.  According to her PCP note, opioids has been discontinued and she should not be prescribed any opioids. They were also trying to wean her off from benzos. -Currently continuing home dose of alprazolam 1 mg at bedtime.  HIV.  Well controlled with undetectable viral load in September 2019. -Continue Biktarvy.  Tobacco abuse.  Nicotine patch.  Code Status.  Full DVT prophylaxis.  Lovenox Diet.  Heart healthy.   Dispo: Admit patient to Observation with expected length of stay less than 2 midnights.  SignedLorella Nimrod, MD 01/04/2018, 2:25 PM  Pager: [phone]

## 2018-01-04 NOTE — ED Notes (Signed)
Admitting doctors stated to hold off on applying bair hugger for now, 4 fresh warm blankets applied. Admitting doctors stated they are coming to see the pt

## 2018-01-04 NOTE — Assessment & Plan Note (Signed)
Assessment  She is on multiple medications for her multiple chronic comorbidities.  That said, unfortunately she takes medications in ways that are not always consistent with how they are prescribed or that have been previously discontinued.  In addition, she has been found on urine drug screens, to have substances that she was never prescribed and that her pharmacy, when called, say they never dispensed.  Thus, she is receiving medications from other sources and these are likely resulting in some of her side effects including the bradycardia, hypotension, near syncope, and urinary retention.  Plan  We will try to trim down her medication list to those that are absolutely necessary given the risk for taking them in ways other than as prescribed and potentially causing harm.  We will stress the importance of only taking medications that are prescribed and to not take medications that she does not have a prescription for.  We will ask, that she discard any other medication she may have that are not on her medication list upon discharge.  I believe this particular problem has led to several of her recent admissions and getting a handle of this problem will be crucial to help her avoid future similar admissions.

## 2018-01-04 NOTE — ED Provider Notes (Signed)
[facility]PART[facility] Provider Note   CSN: [id number] Arrival date & time: 01/04/18  1132     History   Chief Complaint Chief Complaint  Patient presents with  . Emesis    HPI Jessica [name] is a 77 y.o. female.  The history is provided by the patient and medical records. No language interpreter was used.  Emesis     Jessica [name] is a 77 y.o. female  with a PMH as listed below including HIV who presents to the Emergency Department complaining of n/v for several weeks. Seen by PCP this morning who transferred to the ER. She was bradycardic, hypothermic and appeared dehydrated. Denies abdominal pain, fever, chills, chest pain, shortness of breath.   Past Medical History:  Diagnosis Date  . Acute renal failure (ARF) ([place]) 08/17/2017  . Anxiety 04/05/2012  . Bilateral sensorineural hearing loss 06/11/2014   Mild to moderate on the left side and slight to mild on the right side per audiometry 05/2014.  Hearing aides with possible masking of tinnitus recommended but patient wished to defer secondary to finances.  . Blood transfusion without reported diagnosis    pt denies  . Bursitis of right shoulder 07/12/2012   s/p shoulder injection 07/12/2012   . Cataract of right eye   . Constipation due to pain medication 04/27/2010  . Diverticulosis 02/08/2012   Extensive left-sided diverticula on colonoscopy March 2012 per Dr. Gala Romney   . Essential hypertension 07/20/2006  . Genital herpes 07/20/2006  . 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 Heart murmur 1961  . Human immunodeficiency virus disease ([place]) 03/27/1986  . Hyperlipidemia LDL goal < 100 04/05/2012  . Long-term current use of opiate analgesic 03/17/2016  . Lumbar degenerative disc disease 07/20/2006   With chronic back pain   . Marijuana use 07/03/2016  . Micturition syncope 09/20/2015  . 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[place]) 11/01/2011   s/p aortobifem bypass 2009   . Periumbilical hernia  4/0/8144   1 cm left periumbilical abdominal wall defect  . 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 Right rotator cuff tear 02/01/2013   Responds to periodic steroid injections  . Seborrhea 09/01/2010  . Shoulder pain, right 12/04/2017  . Small bowel obstruction due to adhesions ([place]) 02/08/2012   s/p Exploratory laparotomy, lysis of adhesions 02/12/12    . 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 Tobacco abuse 02/19/2012  . Tobacco abuse   . Vasovagal syncope 02/15/2015  . Voiding dysfunction    s/p cystoscopy and meatal dilation Dec 2005    Patient Active Problem List   Diagnosis Date Noted  . Pre-syncope 01/04/2018  . Shoulder pain, right 12/04/2017  . Diastolic dysfunction 81/85/6314  . Bilateral lower extremity edema 09/11/2017  . Benzodiazepine dependence ([place]) 08/17/2017  . Marijuana use 07/03/2016  . Bilateral sensorineural hearing loss 06/11/2014  . 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 Chronic sinusitis 04/02/2013  . Right rotator cuff tear 02/01/2013  . 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 Generalized anxiety disorder 04/05/2012  . Healthcare maintenance 04/05/2012  . Hyperlipidemia 04/05/2012  . Tobacco abuse 02/19/2012  . History of small bowel obstruction 02/08/2012  . Diverticulosis of colon without hemorrhage 02/08/2012  . 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[place]) 11/01/2011  . Seborrhea 09/01/2010  . Constipation due to pain medication 04/27/2010  . Genital herpes 07/20/2006  . 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 Essential  hypertension 07/20/2006  . Lumbar degenerative disc disease 07/20/2006  . ABFND, PAP SMEAR, CERVIX/CERVICAL HPV NEC 07/20/2006  . Human immunodeficiency virus disease ([place]) 03/27/1986    Past Surgical History:  Procedure Laterality Date  . ABDOMINAL HYSTERECTOMY    . AORTO-FEMORAL BYPASS GRAFT  04/2007  . APPENDECTOMY    . BREAST SURGERY     Breast biopsy:  negative  . CHOLECYSTECTOMY    . COLECTOMY  01/2011   Dr. Margot Chimes; "took out 12 inches of small intestiines and removed blockage"  . EYE SURGERY    . LAPAROTOMY  02/12/2012   Procedure: EXPLORATORY LAPAROTOMY;  Surgeon: Stark Klein, MD;  Location: [facility] OR;  Service: General;  Laterality: N/A;  Exploratory Laparotomy, lysis of adhesions  . SMALL INTESTINE SURGERY       OB History    Gravida  6   Para  4   Term  4   Preterm      AB  2   Living  4     SAB  2   TAB      Ectopic      Multiple      Live Births               Home Medications    Prior to Admission medications   Medication Sig Start Date End Date Taking? Authorizing Provider  alendronate (FOSAMAX) 70 MG tablet Take 1 tablet (70 mg total) by mouth once a week. Patient taking differently: Take 70 mg by mouth every Sunday.  07/02/17  Yes Oval Linsey, MD  ALPRAZolam Duanne Moron) 1 MG tablet Take 1 tablet (1 mg total) by mouth at bedtime as needed for anxiety. 11/23/17  Yes Oval Linsey, MD  amLODipine (NORVASC) 5 MG tablet Take 2 tablets (10 mg total) by mouth daily. Patient taking differently: Take 5 mg by mouth 2 (two) times daily.  10/19/17  Yes Jean Rosenthal, MD  aspirin 81 MG EC tablet Take 81 mg by mouth daily.     Yes [provider]  atenolol (TENORMIN) 50 MG tablet Take 1 tablet (50 mg total) by mouth daily. 09/28/17  Yes Katherine Roan, MD  atorvastatin (LIPITOR) 10 MG tablet TAKE 1 TABLET BY MOUTH EVERY DAY (STOP LOVASTATIN) Patient taking differently: Take 10 mg by mouth daily. TAKE 1 TABLET BY MOUTH EVERY DAY (STOP LOVASTATIN) 03/29/17  Yes Tommy Medal, Lavell Islam, MD  benazepril (LOTENSIN) 40 MG tablet TAKE ONE TABLET BY MOUTH DAILY Patient taking differently: Take 40 mg by mouth daily.  07/19/17  Yes Annia Belt, MD  bictegravir-emtricitabine-tenofovir AF (BIKTARVY) 50-200-25 MG TABS tablet Take 1 tablet by mouth daily. 10/02/17  Yes Tommy Medal, Lavell Islam, MD  calcium-vitamin D (OSCAL  WITH D) 500-200 MG-UNIT per tablet Take 1 tablet by mouth 2 (two) times daily.   Yes [provider]  COMBIGAN 0.2-0.5 % ophthalmic solution Place 1 drop into the left eye 2 (two) times daily.  01/06/13  Yes [provider]  diclofenac sodium (VOLTAREN) 1 % GEL Apply 2 g topically 4 (four) times daily. Patient taking differently: Apply 2 g topically See admin instructions. Apply 2 grams to painful areas of shoulders, arms, stomach, and thighs 08/09/17  Yes Oval Linsey, MD  docusate sodium (COLACE) 100 MG capsule Take 100 mg by mouth 2 (two) times daily.   Yes [provider]  dorzolamide (TRUSOPT) 2 % ophthalmic solution Place 1 drop into both eyes 2 (two) times daily. 08/03/17  Yes [provider]  latanoprost (XALATAN) 0.005 % ophthalmic solution Place 1 drop into both eyes at bedtime. 08/16/17  Yes [provider]  Multiple Vitamin (MULTIVITAMIN WITH MINERALS) TABS Take 1 tablet by mouth daily.   Yes [provider]  pantoprazole (PROTONIX) 40 MG tablet TAKE ONE TABLET BY MOUTH EVERY DAY Patient taking differently: Take 40 mg by mouth daily.  09/18/17  Yes Tommy Medal, Lavell Islam, MD  spironolactone (ALDACTONE) 100 MG tablet Take 1 tablet (100 mg total) by mouth daily. 08/23/17  Yes Oval Linsey, MD  terazosin (HYTRIN) 5 MG capsule Take 1 capsule (5 mg total) by mouth at bedtime. 11/02/17  Yes Agyei, Caprice Kluver, MD  triamcinolone ointment (KENALOG) 0.1 % Apply 1 application topically 2 (two) times daily as needed (rash).   Yes [provider]  spironolactone (ALDACTONE) 25 MG tablet Take 1 tablet (25 mg total) by mouth daily. Patient not taking: Reported on 01/04/2018 09/28/17   Katherine Roan, MD    Family History Family History  Problem Relation Age of Onset  . Kidney failure Mother   . Diabetes Mother   . Hypertension Mother   . Heart disease Mother   . Glaucoma Father   . Congestive Heart Failure Sister   . Diabetes Sister     . Kidney disease Sister   . Diabetes Brother   . Unexplained death Brother 83       Automobile accident  . Hypothyroidism Daughter   . Arthritis Daughter        Neck/Back  . Healthy Son   . HIV/AIDS Brother   . HIV Daughter   . Kidney disease Daughter   . Arthritis Son        Knee    Social History Social History   Tobacco Use  . Smoking status: Current Every Day Smoker    Packs/day: 0.50    Years: 50.00    Pack years: 25.00    Types: Cigarettes  . Smokeless tobacco: Never Used  . Tobacco comment: smoking .5 PPD  Substance Use Topics  . Alcohol use: No    Alcohol/week: 0.0 standard drinks    Comment: "last drink of alcohol ~ 1977"  . Drug use: No    Comment: cutting back on chantix, 1 cigarette this morning     Allergies   Hctz [hydrochlorothiazide]   Review of Systems Review of Systems  Gastrointestinal: Positive for nausea and vomiting.  All other systems reviewed and are negative.    Physical Exam Updated Vital Signs BP (!) 97/58   Pulse (!) 52   Temp (!) 97.4 F (36.3 C) (Oral)   Resp 16   SpO2 97%   Physical Exam  Constitutional: She is oriented to person, place, and time. She appears well-developed and well-nourished. No distress.  HENT:  Head: Normocephalic and atraumatic.  Dry cracked lips.  Neck: Neck supple.  Cardiovascular: Normal rate, regular rhythm and normal heart sounds.  No murmur heard. Pulmonary/Chest: Effort normal and breath sounds normal. No respiratory distress.  Abdominal: Soft. Bowel sounds are normal. She exhibits no distension.  Diffuse abdominal tenderness without focality.   Neurological: She is alert and oriented to person, place, and time.  Skin: Skin is warm and dry.  Nursing note and vitals reviewed.    ED Treatments / Results  Labs (all labs ordered are listed, but only abnormal results are displayed) Labs Reviewed  VITAMIN B12  IRON AND TIBC  FERRITIN  RETICULOCYTES  FOLATE RBC  OCCULT BLOOD  X 1 CARD  TO LAB, STOOL  METHYLMALONIC ACID, SERUM    EKG None  Radiology Dg Chest 2 View  Result Date: 01/04/2018 CLINICAL DATA:  Congestion and nausea for 3 weeks. Nausea and vomiting last evening. EXAM: CHEST - 2 VIEW COMPARISON:  Chest x-ray 11/12/2017 FINDINGS: The cardiac silhouette, mediastinal and hilar contours are normal. Mild tortuosity and calcification of the thoracic aorta. The lungs are clear of an acute process. Mild stable emphysematous changes. No pleural effusion. The bony thorax is intact. IMPRESSION: No acute cardiopulmonary findings. Electronically Signed   By: Marijo Sanes M.D.   On: 01/04/2018 13:00    Procedures Procedures (including critical care time)  Medications Ordered in ED Medications  enoxaparin (LOVENOX) injection 40 mg (has no administration in time range)  acetaminophen (TYLENOL) tablet 650 mg (650 mg Oral Given 01/04/18 1336)    Or  acetaminophen (TYLENOL) suppository 650 mg ( Rectal See Alternative 01/04/18 1336)  traMADol (ULTRAM) tablet 50 mg (has no administration in time range)  senna (SENOKOT) tablet 8.6 mg (has no administration in time range)  atorvastatin (LIPITOR) tablet 10 mg (has no administration in time range)  bictegravir-emtricitabine-tenofovir AF (BIKTARVY) 50-200-25 MG per tablet 1 tablet (has no administration in time range)  brimonidine-timolol (COMBIGAN) 0.2-0.5 % ophthalmic solution 1 drop (has no administration in time range)  dorzolamide (TRUSOPT) 2 % ophthalmic solution 1 drop (has no administration in time range)  latanoprost (XALATAN) 0.005 % ophthalmic solution 1 drop (has no administration in time range)  ALPRAZolam (XANAX) tablet 1 mg (has no administration in time range)  aspirin EC tablet 81 mg (has no administration in time range)  nicotine (NICODERM CQ - dosed in mg/24 hours) patch 14 mg (has no administration in time range)  calcium gluconate inj 10% (1 g) URGENT USE ONLY! (1 g Intravenous Given 01/04/18 1330)  sodium  polystyrene (KAYEXALATE) 15 GM/60ML suspension 30 g (30 g Oral Given 01/04/18 1334)     Initial Impression / Assessment and Plan / ED Course  I have reviewed the triage vital signs and the nursing notes.  Pertinent labs & imaging results that were available during my care of the patient were reviewed by me and considered in my medical decision making (see chart for details).    Chenille Toor is a 77 y.o. female who presents to ED from IM clinic for intermittent n/v for a few weeks with near syncopal event. In the office, she was bradycardic and mildly hypotensive. She was started on fluids and BP did improve. She is on multiple BP medications and per daughter, possible that she took too many of her meds. Labs were obtained at the office prior to transfer and notable for hyperkalemia of 5.6 and new AKI with creatinine of 3.78. Admitted to IMTS for further workup and care.    Final Clinical Impressions(s) / ED Diagnoses   Final diagnoses:  Hypothermia    ED Discharge Orders    None       Keyani Rigdon, Ozella Almond, PA-C 01/04/18 1442    Milton Ferguson, MD 01/05/18 2121

## 2018-01-04 NOTE — Assessment & Plan Note (Signed)
Assessment  The hypothermia in combination with the hypotension and shaking chills was concerning for possible sepsis.  Plan  An IV was immediately started and she was provided IV fluids.  Blood for culture was also drawn immediately.  The plan was for admission to a telemetry bed but none were available.  She was therefore transferred to the emergency department for closer monitoring and continued care.

## 2018-01-04 NOTE — Patient Instructions (Signed)
Patient admitted to [facility] Internal Medicine Service.

## 2018-01-04 NOTE — Assessment & Plan Note (Signed)
Assessment  Upon medication reconciliation it was discovered that she was taking 100 mg of atenolol rather than the prescribed 50 mg daily. This likely is contributing to her bradycardia.  She has a history of chronic sinus bradycardia which has been asymptomatic.  It is possible, she was doubling the dose of the atenolol during those episodes as well.  Plan  We will discontinue the atenolol and not provide her any further medication given our concerns about taking medications and ways that are not consistent with how they are prescribed.

## 2018-01-04 NOTE — ED Notes (Signed)
Page sent to IM about pts rectal temp

## 2018-01-04 NOTE — Telephone Encounter (Signed)
The hydralazine was discontinued during her last inpatient admission with the thought being it could have contributed to her urinary retention.  She has an appointment with me this AM and we will reassess the blood pressure.  If necessary adjustments will be made in her regimen and if I choose to restart the hydralazine I will re-write the prescription.  Please call [facility] to inform them that a prescription will be sent if appropriate after today's visit.  Thanks.

## 2018-01-04 NOTE — Progress Notes (Signed)
Pt transported to ED via w/c; accompanied by RN and NT also daughter. IV fluids infusing. Registered - assisted to back to be triage by EMT.

## 2018-01-05 DIAGNOSIS — Z7982 Long term (current) use of aspirin: Secondary | ICD-10-CM | POA: Diagnosis not present

## 2018-01-05 DIAGNOSIS — N179 Acute kidney failure, unspecified: Secondary | ICD-10-CM | POA: Diagnosis not present

## 2018-01-05 DIAGNOSIS — Z79899 Other long term (current) drug therapy: Secondary | ICD-10-CM | POA: Diagnosis not present

## 2018-01-05 DIAGNOSIS — F17210 Nicotine dependence, cigarettes, uncomplicated: Secondary | ICD-10-CM | POA: Diagnosis not present

## 2018-01-05 DIAGNOSIS — I10 Essential (primary) hypertension: Secondary | ICD-10-CM | POA: Diagnosis not present

## 2018-01-05 DIAGNOSIS — R69 Illness, unspecified: Secondary | ICD-10-CM | POA: Diagnosis not present

## 2018-01-05 DIAGNOSIS — D553 Anemia due to disorders of nucleotide metabolism: Secondary | ICD-10-CM | POA: Diagnosis not present

## 2018-01-05 LAB — BASIC METABOLIC PANEL
ANION GAP: 10 (ref 5–15)
BUN: 47 mg/dL — ABNORMAL HIGH (ref 8–23)
CALCIUM: 8.8 mg/dL — AB (ref 8.9–10.3)
CO2: 12 mmol/L — AB (ref 22–32)
CREATININE: 1.77 mg/dL — AB (ref 0.44–1.00)
Chloride: 117 mmol/L — ABNORMAL HIGH (ref 98–111)
GFR calc Af Amer: 31 mL/min — ABNORMAL LOW (ref >60)
GFR, EST NON AFRICAN AMERICAN: 27 mL/min — AB (ref >60)
GLUCOSE: 81 mg/dL (ref 70–99)
Potassium: 4.5 mmol/L (ref 3.5–5.1)
Sodium: 139 mmol/L (ref 135–145)

## 2018-01-05 LAB — CBC
HCT: 30.8 % — ABNORMAL LOW (ref 36.0–46.0)
Hemoglobin: 10.1 g/dL — ABNORMAL LOW (ref 12.0–15.0)
MCH: 32.7 pg (ref 26.0–34.0)
MCHC: 32.8 g/dL (ref 30.0–36.0)
MCV: 99.7 fL (ref 80.0–100.0)
PLATELETS: 284 10*3/uL (ref 150–400)
RBC: 3.09 MIL/uL — AB (ref 3.87–5.11)
RDW: 13.9 % (ref 11.5–15.5)
WBC: 8.3 10*3/uL (ref 4.0–10.5)
nRBC: 0 % (ref 0.0–0.2)

## 2018-01-05 LAB — URINALYSIS, ROUTINE W REFLEX MICROSCOPIC
Bilirubin, UA: NEGATIVE
GLUCOSE, UA: NEGATIVE
Ketones, UA: NEGATIVE
Nitrite, UA: POSITIVE — AB
RBC, UA: NEGATIVE
Specific Gravity, UA: 1.018 (ref 1.005–1.030)
Urobilinogen, Ur: 0.2 mg/dL (ref 0.2–1.0)
pH, UA: 5 (ref 5.0–7.5)

## 2018-01-05 LAB — MICROSCOPIC EXAMINATION

## 2018-01-05 LAB — GLUCOSE, CAPILLARY: GLUCOSE-CAPILLARY: 83 mg/dL (ref 70–99)

## 2018-01-05 NOTE — Progress Notes (Addendum)
   Subjective: [name] reported feeling well this morning.  She denied any abdominal pain, nausea, lightheadedness, headaches, lower extremity pain or weakness.  She is no longer feeling cold.  All questions and concerns were addressed.  Objective:  Vital signs in last 24 hours: Vitals:   01/04/18 1545 01/04/18 1630 01/04/18 1744 01/05/18 0648  BP: (!) 93/42 (!) 110/47 (!) 143/88 (!) 126/49  Pulse: (!) 54 (!) 48 (!) 58 (!) 48  Resp: 13 13 (!) 21 16  Temp:   (!) 97.4 F (36.3 C) 98.8 F (37.1 C)  TempSrc:   Oral Oral  SpO2: 97% 93%  99%  Weight:   59.6 kg 56 kg  Height:   5\' 8"  (1.727 m)    Physical Exam  Constitutional: She is oriented to person, place, and time and well-developed, well-nourished, and in no distress.  Cardiovascular: Normal rate, regular rhythm and normal heart sounds.  No murmur heard. Pulmonary/Chest: Effort normal and breath sounds normal. No respiratory distress. She has no wheezes.  Abdominal: Soft. Bowel sounds are normal. She exhibits no distension.  Musculoskeletal: She exhibits no edema.  Neurological: She is alert and oriented to person, place, and time.  Skin: Skin is warm and dry.    Assessment/Plan:  Active Problems:   Pre-syncope  JERMEKA SCHLOTTERBECK Mannsis a 77 y.o.lady with PMHx significant for HIV (currently compliant on Biktarvy,HTN, PVD s/p aortobifem bypass in 2009, opioid &benzo dependence, and tobacco abuse presented to [facility] with complaint of dizziness and generalized weakness.  Pre-syncope - Most likely secondary to polypharmacy and medication mismanagement - BC NGTD - UC multiple species present; repeat urine culture - PT recommends home health PT  - Need extensive education regarding medication management  HTN 126/49 - plan to discontinue atenolol, Spironolactone and hydralazine - Benazepril and amlodipine can be added as needed  Hyperkalemia - K 4.5 - Patient was taking Spironolactone 125 mg and benazepril 40 mg  daily - discontinue Spironolactone at this time - monitor BMP  AKI - Cr 1.77, improved from 3.78 at admission - Renal US did not show any acute renal finding, minimal asymmetric prominence of right renal collecting system without overt hydronephrosis; suspicion of chronic medical renal disease  - f/u am bmp   Bradycardia - HR 52 - She was taking atenolol 100 mg daily, prescription was for 50 mg daily. - Holding atenolol and monitoring  Macrocytic anemia - She has a drop in her hemoglobin from 13.2  89-month ago to 10.2 today with macrocytosis - vitamin b12 nl, iron studies nl, ferritin nl, reticulocytes nl, RBC mildly decreased - folate and MMA pending  - FOBT negative - CBC stable   Chronic pain -Currently continuing home dose of alprazolam 1 mg at bedtime -avoid opiates    Dispo: Anticipated discharge in approximately 1 day(s).   Mike Craze, DO 01/05/2018, 9:01 AM Pager: [phone]

## 2018-01-05 NOTE — Evaluation (Signed)
Physical Therapy Evaluation Patient Details Name: Jessica [name] MRN: [id number] DOB: 1940/03/19 Today's Date: 01/05/2018   History of Present Illness  Pt adm with weakness and hypotension. PMH - AKI, rotator cuff repair, encephalopathy, HIV, SBO  Clinical Impression  Pt presents to PT with history of falls at home. Pt's gait with rollator is steady this AM without signs of instability. Recommend HHPT to assess pt at home with history of falls.     Follow Up Recommendations Home health PT    Equipment Recommendations  None recommended by PT    Recommendations for Other Services       Precautions / Restrictions Precautions Precautions: Fall Restrictions Weight Bearing Restrictions: No      Mobility  Bed Mobility               General bed mobility comments: Pt sitting EOB  Transfers Overall transfer level: Needs assistance Equipment used: 4-wheeled walker Transfers: Sit to/from Stand Sit to Stand: Supervision         General transfer comment: Incr time and effort to rise  Ambulation/Gait Ambulation/Gait assistance: Supervision Gait Distance (Feet): 225 Feet Assistive device: 4-wheeled walker Gait Pattern/deviations: Step-through pattern;Decreased stride length Gait velocity: decr Gait velocity interpretation: >2.62 ft/sec, indicative of community ambulatory General Gait Details: Pt with steady gait with rollator  Stairs            Wheelchair Mobility    Modified Rankin (Stroke Patients Only)       Balance Overall balance assessment: Needs assistance Sitting-balance support: No upper extremity supported;Feet supported Sitting balance-Leahy Scale: Good     Standing balance support: No upper extremity supported;During functional activity Standing balance-Leahy Scale: Fair                               Pertinent Vitals/Pain Pain Assessment: No/denies pain    Home Living Family/patient expects to be discharged to::  Private residence Living Arrangements: Children;Other relatives Available Help at Discharge: Family Type of Home: House Home Access: Stairs to enter Entrance Stairs-Rails: None Entrance Stairs-Number of Steps: 2-3 Home Layout: One level Home Equipment: [place] - 4 wheels;Bedside commode;Shower seat      Prior Function Level of Independence: Independent with assistive device(s)         Comments: Uses rollator. History of falls     Hand Dominance        Extremity/Trunk Assessment   Upper Extremity Assessment Upper Extremity Assessment: Defer to OT evaluation    Lower Extremity Assessment Lower Extremity Assessment: Overall WFL for tasks assessed       Communication   Communication: No difficulties  Cognition Arousal/Alertness: Awake/alert Behavior During Therapy: WFL for tasks assessed/performed Overall Cognitive Status: No family/caregiver present to determine baseline cognitive functioning                                        General Comments      Exercises     Assessment/Plan    PT Assessment All further PT needs can be met in the next venue of care  PT Problem List Decreased balance;Decreased mobility       PT Treatment Interventions      PT Goals (Current goals can be found in the Care Plan section)  Acute Rehab PT Goals PT Goal Formulation: All assessment and education complete, DC therapy  Frequency     Barriers to discharge        Co-evaluation               AM-PAC PT "6 Clicks" Daily Activity  Outcome Measure Difficulty turning over in bed (including adjusting bedclothes, sheets and blankets)?: None Difficulty moving from lying on back to sitting on the side of the bed? : None Difficulty sitting down on and standing up from a chair with arms (e.g., wheelchair, bedside commode, etc,.)?: A Little Help needed moving to and from a bed to chair (including a wheelchair)?: None Help needed walking in hospital room?:  None Help needed climbing 3-5 steps with a railing? : A Little 6 Click Score: 22    End of Session Equipment Utilized During Treatment: Gait belt Activity Tolerance: Patient tolerated treatment well Patient left: in chair;with call bell/phone within reach;with chair alarm set Nurse Communication: Mobility status PT Visit Diagnosis: History of falling (Z91.81)    Time: 3009-2330 PT Time Calculation (min) (ACUTE ONLY): 17 min   Charges:   PT Evaluation $PT Eval Low Complexity: [place] Pager [phone] Office [place] 01/05/2018, 10:20 AM

## 2018-01-05 NOTE — Discharge Summary (Addendum)
Name: Jessica [name] MRN: [id number] DOB: 11-03-1940 77 y.o. PCP: Oval Linsey, MD  Date of Admission: 01/04/2018 11:39 AM Date of Discharge: 01/06/2018 Attending Physician: No att. providers found  Discharge Diagnosis: 1. Pre-syncope 2. AKI 3. Macrocytic Anemia  Discharge Medications: Allergies as of 01/06/2018      Reactions   Hctz [hydrochlorothiazide] Other (See Comments)   Dizziness, syncope; does NOT wish to take anymore      Medication List    STOP taking these medications   atenolol 50 MG tablet Commonly known as:  TENORMIN   spironolactone 100 MG tablet Commonly known as:  ALDACTONE   spironolactone 25 MG tablet Commonly known as:  ALDACTONE   terazosin 5 MG capsule Commonly known as:  HYTRIN     TAKE these medications   alendronate 70 MG tablet Commonly known as:  FOSAMAX Take 1 tablet (70 mg total) by mouth once a week. What changed:  when to take this   ALPRAZolam 1 MG tablet Commonly known as:  XANAX Take 1 tablet (1 mg total) by mouth at bedtime as needed for anxiety.   amLODipine 5 MG tablet Commonly known as:  NORVASC Take 2 tablets (10 mg total) by mouth daily. What changed:    how much to take  when to take this   aspirin 81 MG EC tablet Take 81 mg by mouth daily.   atorvastatin 10 MG tablet Commonly known as:  LIPITOR TAKE 1 TABLET BY MOUTH EVERY DAY (STOP LOVASTATIN) What changed:    how much to take  how to take this  when to take this   benazepril 40 MG tablet Commonly known as:  LOTENSIN TAKE ONE TABLET BY MOUTH DAILY   bictegravir-emtricitabine-tenofovir AF 50-200-25 MG Tabs tablet Commonly known as:  BIKTARVY Take 1 tablet by mouth daily.   calcium-vitamin D 500-200 MG-UNIT tablet Commonly known as:  OSCAL WITH D Take 1 tablet by mouth 2 (two) times daily.   COMBIGAN 0.2-0.5 % ophthalmic solution Generic drug:  brimonidine-timolol Place 1 drop into the left eye 2 (two) times daily.     diclofenac sodium 1 % Gel Commonly known as:  VOLTAREN Apply 2 g topically 4 (four) times daily. What changed:    when to take this  additional instructions   docusate sodium 100 MG capsule Commonly known as:  COLACE Take 100 mg by mouth 2 (two) times daily.   dorzolamide 2 % ophthalmic solution Commonly known as:  TRUSOPT Place 1 drop into both eyes 2 (two) times daily.   latanoprost 0.005 % ophthalmic solution Commonly known as:  XALATAN Place 1 drop into both eyes at bedtime.   multivitamin with minerals Tabs tablet Take 1 tablet by mouth daily.   pantoprazole 40 MG tablet Commonly known as:  PROTONIX TAKE ONE TABLET BY MOUTH EVERY DAY   triamcinolone ointment 0.1 % Commonly known as:  KENALOG Apply 1 application topically 2 (two) times daily as needed (rash).       Disposition and follow-up:   Jessica [name] was discharged from [facility] in Stable condition.  At the hospital follow up visit please address:  1.  UTI-patient found to have a UTI growing Pseudomonas; prescribed ciprofloxacin 500 mg for 3 days; address compliance and reassess symptoms  2.  Pre-syncope- patient most likely presented hypotensive due to taking multiple blood pressure medication; please reassess patient's compliance to recommended antihypertensives; PT/OT recommended home health PT which was ordered on 10/29  2.  Labs / imaging needed at time of follow-up: None  3.  Pending labs/ test needing follow-up: folate, MMA  Follow-up Appointments:   Hospital Course by problem list: 1. Pre-syncope-presented to the ED with lightheadedness and episodes of near syncope over the last week.  He was found to be hypotensive, bradycardic and hypothermic in the clinic and was referred to the ED for further evaluation.  The etiology of her hypotension was secondary to taking multiple blood pressure pills including double the dose of atenolol as well as hydralazine which was  discontinued last month.  Blood culture showed no growth to date.  She was discharged with plans to only continue Benzapril 40 mg and amlodipine 10 mg and discontinue all other antihypertensive medications. Home health PT was ordered on 10/29.  2. UTI-she complained of increased frequency during hospital stay.  Denied any burning or pain with urination.  Urine culture grew Pseudomonas.  She was prescribed ciprofloxacin 500 mg for 3 days.  3. AKI-patient noted to have an AK I with creatinine of 3.78 with baseline creatinine within normal limits.  Etiology unclear; may have ATN secondary to hypotension.  She was also on benazepril and spironolactone which could have worsened her renal function in the setting of dehydration and hypotension.  Renal ultrasound did not show any acute renal findings minimal asymmetric prominence of right renal collecting system without overt hydronephrosis; suspicion of chronic medical renal disease.  Pete BMP showed a creatinine of 0.99.  4. Macrocytic Anemia-She has a drop in her hemoglobin from 13.2 81-month ago to 10.2 today with macrocytosis. vitamin b12 nl, iron studies nl, ferritin nl, reticulocytes nl, RBC mildly decreased. FOBT negative and CBC remained stable. Folate and MMA pending.   Discharge Vitals:   BP (!) 132/56   Pulse 67   Temp 98.8 F (37.1 C) (Oral)   Resp 15   Ht 5\' 8"  (1.727 m)   Wt 55.6 kg   SpO2 97%   BMI 18.64 kg/m   Pertinent Labs, Studies, and Procedures:   CBC Latest Ref Rng & Units 01/05/2018 01/04/2018 01/04/2018  WBC 4.0 - 10.5 K/uL 8.3 8.4 -  Hemoglobin 12.0 - 15.0 g/dL 10.1(L) 10.2(L) -  Hematocrit 36.0 - 46.0 % 30.8(L) 29.6(L) 33.6(L)  Platelets 150 - 400 K/uL 284 310 -   CMP Latest Ref Rng & Units 01/06/2018 01/05/2018 01/04/2018  Glucose 70 - 99 mg/dL 79 81 94  BUN 8 - 23 mg/dL 23 47(H) 59(H)  Creatinine 0.44 - 1.00 mg/dL 0.99 1.77(H) 2.83(H)  Sodium 135 - 145 mmol/L 143 139 139  Potassium 3.5 - 5.1 mmol/L 4.1 4.5 5.0    Chloride 98 - 111 mmol/L 117(H) 117(H) 111  CO2 22 - 32 mmol/L 18(L) 12(L) 14(L)  Calcium 8.9 - 10.3 mg/dL 9.2 8.8(L) 9.9  Total Protein 6.5 - 8.1 g/dL - - -  Total Bilirubin 0.3 - 1.2 mg/dL - - -  Alkaline Phos 38 - 126 U/L - - -  AST 15 - 41 U/L - - -  ALT 0 - 44 U/L - - -   Dg Chest 2 View  Result Date: 01/04/2018 CLINICAL DATA:  Congestion and nausea for 3 weeks. Nausea and vomiting last evening. EXAM: CHEST - 2 VIEW COMPARISON:  Chest x-ray 11/12/2017 FINDINGS: The cardiac silhouette, mediastinal and hilar contours are normal. Mild tortuosity and calcification of the thoracic aorta. The lungs are clear of an acute process. Mild stable emphysematous changes. No pleural effusion. The bony thorax is  intact. IMPRESSION: No acute cardiopulmonary findings. Electronically Signed   By: Marijo Sanes M.D.   On: 01/04/2018 13:00   US Renal  Result Date: 01/04/2018 CLINICAL DATA:  77 year old female with acute renal failure. EXAM: RENAL / URINARY TRACT ULTRASOUND COMPLETE COMPARISON:  Chest CT 08/17/2017. FINDINGS: Right Kidney: Length: 8.6 centimeters. Renal cortical echogenicity appears increased (images 11 and 14). Minimal prominence of the right intrarenal collecting system but no hydronephrosis. No right renal mass or lesion. Left Kidney: Length: 9.8 centimeters. Left renal cortical thickness and echogenicity may be better preserved. No left hydronephrosis or renal mass. Bladder: Appears normal for degree of bladder distention. The left ureteral jet was detected with Doppler although the right was not. IMPRESSION: 1. No definite acute renal finding - minimal asymmetric prominence of the right renal collecting system without overt hydronephrosis. 2. Suspicion of chronic medical renal disease. Electronically Signed   By: Genevie Ann M.D.   On: 01/04/2018 17:24   Discharge Instructions: Discharge Instructions    Diet - low sodium heart healthy   Complete by:  As directed    Discharge instructions    Complete by:  As directed    Jessica [name],   It was a pleasure taking care of you while you were in the hospital. Your symptoms were most likely due to taking too many of your anti-hypertensive medications.  At this time we want you to only continue taking Benazepril 40 mg once a day and Amlodipine 10 mg (two 5 mg tablets) once a day. Please stop taking Spironolactone, Atenolol and Terazosin.  You can continue taking all your other medications as prescribed. The Voltaren gel was prescribed to you last month and there should be two more refills at your pharmacy.  Please follow up with the Internal Medicine Clinic next week. They will be in touch with you to schedule an appointment as soon as possible.  Thank you for allowing [name] to be a part of your care!   Increase activity slowly   Complete by:  As directed       Signed: Mike Craze, DO 01/08/2018, 11:56 AM   Pager: [phone]

## 2018-01-05 NOTE — Evaluation (Signed)
Occupational Therapy Evaluation  Patient Details Name: Jessica [name] MRN: [id number] DOB: [birth year] Today's Date: 01/05/2018    History of Present Illness Pt adm with weakness and hypotension. PMH - AKI, rotator cuff repair, encephalopathy, HIV, SBO   Clinical Impression   PTA, pt was living with her daughter and was independent with rollator for basic ADL and functional mobility. She currently is able to complete all ADL with modified independence in hospital setting including simulated tub transfer (with UE support on counter as she does have this at home). She does demonstrate some deficits in attention as well as hyperfocus on topics distracting her ability to focus on the task at hand. Educated pt concerning strategies to maximize safety with medication management tasks. Feel that pt would benefit from follow-up with home health OT post-acute D/C to maximize safety in her home setting. However, no further acute OT needs are identified. Will sign off acutely.     Follow Up Recommendations  Home health OT;Supervision/Assistance - 24 hour    Equipment Recommendations  None recommended by OT    Recommendations for Other Services       Precautions / Restrictions Precautions Precautions: Fall Restrictions Weight Bearing Restrictions: No      Mobility Bed Mobility               General bed mobility comments: Pt OOB in recliner on my arrival.   Transfers Overall transfer level: Needs assistance Equipment used: None Transfers: Sit to/from Stand Sit to Stand: Supervision         General transfer comment: Incr time and effort to rise    Balance Overall balance assessment: Needs assistance Sitting-balance support: No upper extremity supported;Feet supported Sitting balance-Leahy Scale: Good     Standing balance support: No upper extremity supported;During functional activity Standing balance-Leahy Scale: Fair                             ADL  either performed or assessed with clinical judgement   ADL Overall ADL's : At baseline;Modified independent                                       General ADL Comments: Completing ADL well in hospital setting today includign simulated tub transfer.      Vision Patient Visual Report: No change from baseline Vision Assessment?: No apparent visual deficits     Perception     Praxis      Pertinent Vitals/Pain Pain Assessment: No/denies pain     Hand Dominance     Extremity/Trunk Assessment Upper Extremity Assessment Upper Extremity Assessment: Overall WFL for tasks assessed   Lower Extremity Assessment Lower Extremity Assessment: Overall WFL for tasks assessed       Communication Communication Communication: No difficulties   Cognition Arousal/Alertness: Awake/alert Behavior During Therapy: WFL for tasks assessed/performed Overall Cognitive Status: No family/caregiver present to determine baseline cognitive functioning Area of Impairment: Attention;Safety/judgement                   Current Attention Level: Selective     Safety/Judgement: Decreased awareness of safety     General Comments: Pt with hyperfocus on her urinating in bed last night and with decreased ability to attend to other tasks as a result. Unsure of safety with medication management and education completed concerning use of checklists and having  daughter assist her. She does have daily pill packets which she reports is helping her compliance.    General Comments  Pt educated concerning strategies to compensate for difficulties with medication management.     Exercises     Shoulder Instructions      Home Living Family/patient expects to be discharged to:: Private residence Living Arrangements: Children;Other relatives Available Help at Discharge: Family(daughter most of the time) Type of Home: House Home Access: Stairs to enter [employer] of Steps:  2-3 Entrance Stairs-Rails: None Home Layout: One level     Bathroom Shower/Tub: Teacher, early years/pre: Standard Bathroom Accessibility: Yes   Home Equipment: Environmental consultant - 4 wheels;Bedside commode;Shower seat;Grab bars - tub/shower          Prior Functioning/Environment Level of Independence: Independent with assistive device(s)        Comments: Uses rollator. History of falls        OT Problem List: Decreased strength;Decreased activity tolerance      OT Treatment/Interventions:      OT Goals(Current goals can be found in the care plan section) Acute Rehab OT Goals Patient Stated Goal: "to go home" OT Goal Formulation: With patient  OT Frequency:     Barriers to D/C:            Co-evaluation              AM-PAC PT "6 Clicks" Daily Activity     Outcome Measure Help from another person eating meals?: None Help from another person taking care of personal grooming?: None Help from another person toileting, which includes using toliet, bedpan, or urinal?: None Help from another person bathing (including washing, rinsing, drying)?: None Help from another person to put on and taking off regular upper body clothing?: None Help from another person to put on and taking off regular lower body clothing?: None 6 Click Score: 24   End of Session    Activity Tolerance: Patient tolerated treatment well Patient left: in chair;with call bell/phone within reach  OT Visit Diagnosis: Other abnormalities of gait and mobility (R26.89)                Time: 2263-3354 OT Time Calculation (min): 12 min Charges:  OT General Charges $OT Visit: 1 Visit OT Evaluation $OT Eval Low Complexity: 1 Low OT Treatments $Self Care/Home Management : 8-22 mins  Belva Agee, OTR/L [place] [facility] [place] A Louisa Favaro 01/05/2018, 12:56 PM

## 2018-01-06 DIAGNOSIS — I10 Essential (primary) hypertension: Secondary | ICD-10-CM | POA: Diagnosis not present

## 2018-01-06 DIAGNOSIS — N390 Urinary tract infection, site not specified: Secondary | ICD-10-CM | POA: Diagnosis not present

## 2018-01-06 DIAGNOSIS — F17200 Nicotine dependence, unspecified, uncomplicated: Secondary | ICD-10-CM

## 2018-01-06 DIAGNOSIS — R55 Syncope and collapse: Secondary | ICD-10-CM | POA: Diagnosis not present

## 2018-01-06 DIAGNOSIS — N179 Acute kidney failure, unspecified: Secondary | ICD-10-CM

## 2018-01-06 DIAGNOSIS — B965 Pseudomonas (aeruginosa) (mallei) (pseudomallei) as the cause of diseases classified elsewhere: Secondary | ICD-10-CM

## 2018-01-06 DIAGNOSIS — Z21 Asymptomatic human immunodeficiency virus [HIV] infection status: Secondary | ICD-10-CM

## 2018-01-06 DIAGNOSIS — R001 Bradycardia, unspecified: Secondary | ICD-10-CM

## 2018-01-06 DIAGNOSIS — I739 Peripheral vascular disease, unspecified: Secondary | ICD-10-CM

## 2018-01-06 DIAGNOSIS — Z79899 Other long term (current) drug therapy: Secondary | ICD-10-CM

## 2018-01-06 DIAGNOSIS — Z888 Allergy status to other drugs, medicaments and biological substances status: Secondary | ICD-10-CM

## 2018-01-06 DIAGNOSIS — F1120 Opioid dependence, uncomplicated: Secondary | ICD-10-CM

## 2018-01-06 DIAGNOSIS — G8929 Other chronic pain: Secondary | ICD-10-CM

## 2018-01-06 DIAGNOSIS — D539 Nutritional anemia, unspecified: Secondary | ICD-10-CM

## 2018-01-06 DIAGNOSIS — F1320 Sedative, hypnotic or anxiolytic dependence, uncomplicated: Secondary | ICD-10-CM

## 2018-01-06 DIAGNOSIS — R69 Illness, unspecified: Secondary | ICD-10-CM | POA: Diagnosis not present

## 2018-01-06 LAB — BASIC METABOLIC PANEL WITH GFR
Anion gap: 8 (ref 5–15)
BUN: 23 mg/dL (ref 8–23)
CO2: 18 mmol/L — ABNORMAL LOW (ref 22–32)
Calcium: 9.2 mg/dL (ref 8.9–10.3)
Chloride: 117 mmol/L — ABNORMAL HIGH (ref 98–111)
Creatinine, Ser: 0.99 mg/dL (ref 0.44–1.00)
GFR calc Af Amer: >60 mL/min
GFR calc non Af Amer: 54 mL/min — ABNORMAL LOW
Glucose, Bld: 79 mg/dL (ref 70–99)
Potassium: 4.1 mmol/L (ref 3.5–5.1)
Sodium: 143 mmol/L (ref 135–145)

## 2018-01-06 LAB — GLUCOSE, CAPILLARY: Glucose-Capillary: 95 mg/dL (ref 70–99)

## 2018-01-06 NOTE — Progress Notes (Addendum)
   Subjective: [name] reported feeling well this morning. She denied any lightheadedness, weakness, chest pain or feeling cold. She reported urinary frequency that she noticed since admission. Denied any pain or burning with urination. She also reported bilateral groin pain that has been going for about the past week. Denied any weakness or pain with ambulating. Her PCP prescribed her diclofenac gel which she said has helped.   Objective:  Vital signs in last 24 hours: Vitals:   01/05/18 0648 01/05/18 1433 01/05/18 2234 01/06/18 0500  BP: (!) 126/49 102/61 (!) 132/56   Pulse: (!) 48 (!) 52 67   Resp: 16 11 15    Temp: 98.8 F (37.1 C) 98.6 F (37 C) 98.8 F (37.1 C)   TempSrc: Oral Oral Oral   SpO2: 99% 99% 97%   Weight: 56 kg   55.6 kg  Height:       Physical Exam  Constitutional: She is oriented to person, place, and time and well-developed, well-nourished, and in no distress.  Cardiovascular: Normal rate, regular rhythm and normal heart sounds.  No murmur heard. Pulmonary/Chest: Effort normal and breath sounds normal. No respiratory distress. She has no wheezes.  Abdominal: Soft. Bowel sounds are normal. She exhibits no distension. There is no tenderness.  Musculoskeletal: She exhibits no edema.  Bilateral groin pain, right muscular thigh pain when her hip was internally rotated   Neurological: She is alert and oriented to person, place, and time.  Skin: Skin is warm and dry.    Assessment/Plan:  Active Problems:   Pre-syncope  Jessica [name] a 77 y.o.lady with PMHxsignificant forHIV(currently compliant on Biktarvy,HTN, PVDs/p aortobifem bypassin 2009, opioid &benzo dependence,and tobacco abuse presented to [facility] with complaint of dizziness and generalized weakness.  Pre-syncope - Most likely secondary to polypharmacy and medication mismanagement - BC NGTD - repeat UC pending - PT recommends home health PT  - Need extensive education regarding  medication management. Plan to continue benazepril and amlodipine only  HTN 132/56 - plan to discontinue atenolol,Spironolactone and hydralazine - Benazepril and amlodipine resumed at discharge  Hyperkalemia -K 4.1, resolved  AKI - Cr 0.99  Bradycardia -HR 63 - She was taking atenolol 100 mg daily,prescription was for 50 mg daily - Holding atenolol and monitoring  Macrocytic anemia - folate and MMA pending   Chronic pain -Currently continuing home dose of alprazolam 1 mg at bedtime -avoid opiates   Dispo: Anticipated discharge is today.   Mike Craze, DO 01/06/2018, 12:47 PM Pager: [phone]

## 2018-01-06 NOTE — Progress Notes (Signed)
  Date: 01/06/2018  Patient name: Jessica [name]  Medical record number: [id number]  Date of birth: [birth year]   I have seen and evaluated this patient and I have discussed the plan of care with the house staff. Please see their note for complete details. I concur with their findings with the following additions/corrections: Stable for d/C home with close F/U in [facility]. We asked her to bring bag with all her meds.  Bartholomew Crews, MD 01/06/2018, 1:40 PM

## 2018-01-06 NOTE — Progress Notes (Signed)
Pt is refusing to wait for CM for home health set up stating his daughter is already here.  Refusing to call his daughter to wait for him.  Stated he would call his doctor tomorrow and see what they can do for it.  Idolina Primer, RN

## 2018-01-06 NOTE — Progress Notes (Signed)
Pt was confused this morning.  She stated that "I think I have to go to the doctor's office." and trying to change her clothes.  She was easily redirected.  Idolina Primer, RN

## 2018-01-07 LAB — FOLATE RBC
FOLATE, RBC: 920 ng/mL (ref >498)
Folate, Hemolysate: 272.4 ng/mL
Hematocrit: 29.6 % — ABNORMAL LOW (ref 34.0–46.6)

## 2018-01-07 LAB — METHYLMALONIC ACID, SERUM: Methylmalonic Acid, Quantitative: 158 nmol/L (ref 0–378)

## 2018-01-08 ENCOUNTER — Telehealth: Payer: Self-pay | Admitting: Internal Medicine

## 2018-01-08 ENCOUNTER — Other Ambulatory Visit: Payer: Self-pay | Admitting: Internal Medicine

## 2018-01-08 DIAGNOSIS — R55 Syncope and collapse: Secondary | ICD-10-CM

## 2018-01-08 DIAGNOSIS — N3000 Acute cystitis without hematuria: Secondary | ICD-10-CM

## 2018-01-08 DIAGNOSIS — N390 Urinary tract infection, site not specified: Secondary | ICD-10-CM

## 2018-01-08 DIAGNOSIS — R5381 Other malaise: Secondary | ICD-10-CM

## 2018-01-08 LAB — URINE CULTURE

## 2018-01-08 MED ORDER — CIPROFLOXACIN HCL 500 MG PO TABS: 500.0000 mg | ORAL_TABLET | Freq: Every day | ORAL | 0 refills | Status: AC

## 2018-01-08 MED ORDER — CEPHALEXIN 500 MG PO CAPS: 500.0000 mg | ORAL_CAPSULE | Freq: Two times a day (BID) | ORAL | 0 refills | Status: AC

## 2018-01-08 NOTE — Progress Notes (Signed)
PT/OT recommended Home Health PT during patient's hospital admission.

## 2018-01-08 NOTE — Progress Notes (Signed)
Patient found to have a UTI. She was informed and prescribed Ciprofloxacin 500 mg for 3 days.

## 2018-01-08 NOTE — Progress Notes (Signed)
Due to UTI growing Pseudomonas, prescribed Ciprofloxacin 500 mg for 3 days. Disregard change of therapy to keflex.

## 2018-01-08 NOTE — Telephone Encounter (Signed)
-----   Message from Mike Craze, DO sent at 01/07/2018  1:26 PM EDT ----- Please schedule a follow up appointment for Ms. Wilkerson by Friday or Monday of next week latest.   Thank you!

## 2018-01-08 NOTE — Progress Notes (Signed)
Changed prescription for UTI to Keflex 500 mg bid for 5 days

## 2018-01-08 NOTE — Telephone Encounter (Signed)
Patient agreed to an appointment for next Monday 01-14-18 at 10:45 am.

## 2018-01-09 LAB — CULTURE, BLOOD (ROUTINE X 2)
CULTURE: NO GROWTH
CULTURE: NO GROWTH
SPECIAL REQUESTS: ADEQUATE
Special Requests: ADEQUATE

## 2018-01-09 LAB — CULTURE, BLOOD (SINGLE)
CULTURE: NO GROWTH
Special Requests: ADEQUATE

## 2018-01-09 NOTE — Telephone Encounter (Signed)
Thank you for coordinating this.

## 2018-01-14 ENCOUNTER — Encounter: Payer: Self-pay | Admitting: Internal Medicine

## 2018-01-14 ENCOUNTER — Other Ambulatory Visit: Payer: Self-pay

## 2018-01-14 ENCOUNTER — Ambulatory Visit (INDEPENDENT_AMBULATORY_CARE_PROVIDER_SITE_OTHER): Payer: Medicare HMO | Admitting: Internal Medicine

## 2018-01-14 DIAGNOSIS — Z87440 Personal history of urinary (tract) infections: Secondary | ICD-10-CM | POA: Diagnosis not present

## 2018-01-14 DIAGNOSIS — Z79899 Other long term (current) drug therapy: Secondary | ICD-10-CM

## 2018-01-14 DIAGNOSIS — I10 Essential (primary) hypertension: Secondary | ICD-10-CM | POA: Diagnosis not present

## 2018-01-14 NOTE — Patient Instructions (Addendum)
Thank you for coming to the clinic today. It was a pleasure to see you.   For your blood pressure, please continue taking amlodipine 10 mg daily and benazepril 40 mg daily   FOLLOW-UP INSTRUCTIONS When: 1 month with Dr. Eppie Gibson For: follow up of your blood pressure   What to bring: all of your medication bottles   Please call the internal medicine center clinic if you have any questions or concerns, we may be able to help and keep you from a long and expensive emergency room wait. Our clinic and after hours phone number is [phone], the best time to call is Monday through Friday 9 am to 4 pm but there is always someone available 24/7 if you have an emergency. If you need medication refills please notify your pharmacy one week in advance and they will send [name] a request.

## 2018-01-14 NOTE — Progress Notes (Signed)
   CC: hospital follow up   HPI:  [name] is a 77 y.o. with PMH as listed below who presents for hospital follow up. Please see the assessment and plans for the status of the patient chronic medical problems.    Past Medical History:  Diagnosis Date  . Acute renal failure (ARF) ([place]) 08/17/2017  . Anxiety 04/05/2012  . Bilateral sensorineural hearing loss 06/11/2014   Mild to moderate on the left side and slight to mild on the right side per audiometry 05/2014.  Hearing aides with possible masking of tinnitus recommended but patient wished to defer secondary to finances.  . Blood transfusion without reported diagnosis    pt denies  . Bursitis of right shoulder 07/12/2012   s/p shoulder injection 07/12/2012   . Cataract of right eye   . Constipation due to pain medication 04/27/2010  . Diverticulosis 02/08/2012   Extensive left-sided diverticula on colonoscopy March 2012 per Dr. Gala Romney   . Essential hypertension 07/20/2006  . Genital herpes 07/20/2006  . 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 Heart murmur 1961  . Human immunodeficiency virus disease ([place]) 03/27/1986  . Hyperlipidemia LDL goal < 100 04/05/2012  . Long-term current use of opiate analgesic 03/17/2016  . Lumbar degenerative disc disease 07/20/2006   With chronic back pain   . Marijuana use 07/03/2016  . Micturition syncope 09/20/2015  . 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[place]) 11/01/2011   s/p aortobifem bypass 2009   . Periumbilical hernia 10/11/1912   1 cm left periumbilical abdominal wall defect  . 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 Right rotator cuff tear 02/01/2013   Responds to periodic steroid injections  . Seborrhea 09/01/2010  . Shoulder pain, right 12/04/2017  . Small bowel obstruction due to adhesions ([place]) 02/08/2012   s/p Exploratory laparotomy, lysis of adhesions 02/12/12    . 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 Tobacco abuse 02/19/2012    . Tobacco abuse   . Vasovagal syncope 02/15/2015  . Voiding dysfunction    s/p cystoscopy and meatal dilation Dec 2005   Review of Systems:  Refer to history of present illness and assessment and plans for pertinent review of systems, all others reviewed and negative  Physical Exam:  Vitals:   01/14/18 1036 01/14/18 1104  BP: (!) 175/67 (!) 123/52  Pulse: 75 67  Temp: 99.1 F (37.3 C)   TempSrc: Oral   SpO2: 98%   Weight: 125 lb 4.8 oz (56.8 kg)   Height: 5\' 8"  (1.727 m)    General: well appearing, no acute distress   Neuro: oriented to person, time, and place  Assessment & Plan:   Hypertension Patient presents for hospital follow up after recent hospitalized for pseudomonas UTI and presyncope. Pre syncope was thought to be related to medication issues, she was taking some antihypertensives at doses different than what was prescirbed. She was discharged with plans to continue benazepril 40 mg daily and amlodipine 10 mg daily and stop taking atenolol and hydralazine. Blood pressure is controlled on recheck today. She is feeling well today, she feels she has returned back to her baseline health and has had no symptoms of UTI since the time of hospital discharge.  - continue amlodipine 10 mg daily and benazepril 40 mg daily  See Encounters Tab for problem based charting.  Patient discussed with Dr. Lynnae January

## 2018-01-14 NOTE — Assessment & Plan Note (Signed)
Patient presents for hospital follow up after recent hospitalized for pseudomonas UTI and presyncope. Pre syncope was thought to be related to medication issues, she was taking some antihypertensives at doses different than what was prescirbed. She was discharged with plans to continue benazepril 40 mg daily and amlodipine 10 mg daily and stop taking atenolol and hydralazine. Blood pressure is controlled on recheck today. She is feeling well today, she feels she has returned back to her baseline health and has had no symptoms of UTI since the time of hospital discharge.  - continue amlodipine 10 mg daily and benazepril 40 mg daily

## 2018-01-15 LAB — TOXASSURE SELECT,+ANTIDEPR,UR

## 2018-01-15 NOTE — Progress Notes (Signed)
Internal Medicine Clinic Attending  Case discussed with Dr. Blum at the time of the visit.  We reviewed the resident's history and exam and pertinent patient test results.  I agree with the assessment, diagnosis, and plan of care documented in the resident's note. 

## 2018-01-18 DIAGNOSIS — R55 Syncope and collapse: Secondary | ICD-10-CM | POA: Diagnosis not present

## 2018-01-18 DIAGNOSIS — H409 Unspecified glaucoma: Secondary | ICD-10-CM | POA: Diagnosis not present

## 2018-01-18 DIAGNOSIS — M5136 Other intervertebral disc degeneration, lumbar region: Secondary | ICD-10-CM | POA: Diagnosis not present

## 2018-01-18 DIAGNOSIS — E785 Hyperlipidemia, unspecified: Secondary | ICD-10-CM | POA: Diagnosis not present

## 2018-01-18 DIAGNOSIS — I739 Peripheral vascular disease, unspecified: Secondary | ICD-10-CM | POA: Diagnosis not present

## 2018-01-18 DIAGNOSIS — D539 Nutritional anemia, unspecified: Secondary | ICD-10-CM | POA: Diagnosis not present

## 2018-01-18 DIAGNOSIS — G8929 Other chronic pain: Secondary | ICD-10-CM | POA: Diagnosis not present

## 2018-01-18 DIAGNOSIS — R69 Illness, unspecified: Secondary | ICD-10-CM | POA: Diagnosis not present

## 2018-01-18 DIAGNOSIS — I10 Essential (primary) hypertension: Secondary | ICD-10-CM | POA: Diagnosis not present

## 2018-01-18 DIAGNOSIS — M75101 Unspecified rotator cuff tear or rupture of right shoulder, not specified as traumatic: Secondary | ICD-10-CM | POA: Diagnosis not present

## 2018-01-21 ENCOUNTER — Other Ambulatory Visit: Payer: Self-pay | Admitting: Internal Medicine

## 2018-01-21 DIAGNOSIS — M75101 Unspecified rotator cuff tear or rupture of right shoulder, not specified as traumatic: Secondary | ICD-10-CM

## 2018-01-21 DIAGNOSIS — H401133 Primary open-angle glaucoma, bilateral, severe stage: Secondary | ICD-10-CM | POA: Diagnosis not present

## 2018-01-22 ENCOUNTER — Encounter: Payer: Self-pay | Admitting: Internal Medicine

## 2018-01-22 ENCOUNTER — Other Ambulatory Visit: Payer: Self-pay

## 2018-01-22 NOTE — Progress Notes (Signed)
Patient ID: Jessica [name], female   DOB: 01-07-1941, 77 y.o.   MRN: [id number]  As expected, there are additional substances in the patient's UDS and again, likely explain her symptoms resulting in the need for admission:  Expected  Alprazolam   Unexpected  Hydrocodone Buprenorphine Cyclobenzaprine Nortriptyline  I will continue to stress to the patient and the daughter the importance of not taking medications that are not prescribed for her.  This has led to significant side effects and I am concerned that she may accidentally take a non-prescribed substance that will lead to an unintentional overdose.  I had previously called her pharmacy to stop any further hydrocodone prescriptions.  We will discuss the buprenorphine in the urine and begin the weaning of the alprazolam once we have discussed this in person so we are all on the same page as to why this is felt to be necessary.  The FYI has been updated.

## 2018-01-22 NOTE — Telephone Encounter (Signed)
diclofenac sodium (VOLTAREN) 1 % GEL, refill request @  [facility] - [address], [place], [place] [phone] (Phone) [phone] (Fax)

## 2018-01-22 NOTE — Telephone Encounter (Signed)
Called pharm spoke to pharmacist, have script, a little to early to fill, they will call pt

## 2018-01-28 DIAGNOSIS — D539 Nutritional anemia, unspecified: Secondary | ICD-10-CM | POA: Diagnosis not present

## 2018-01-28 DIAGNOSIS — I10 Essential (primary) hypertension: Secondary | ICD-10-CM | POA: Diagnosis not present

## 2018-01-28 DIAGNOSIS — H409 Unspecified glaucoma: Secondary | ICD-10-CM | POA: Diagnosis not present

## 2018-01-28 DIAGNOSIS — R69 Illness, unspecified: Secondary | ICD-10-CM | POA: Diagnosis not present

## 2018-01-28 DIAGNOSIS — M5136 Other intervertebral disc degeneration, lumbar region: Secondary | ICD-10-CM | POA: Diagnosis not present

## 2018-01-28 DIAGNOSIS — E785 Hyperlipidemia, unspecified: Secondary | ICD-10-CM | POA: Diagnosis not present

## 2018-01-28 DIAGNOSIS — M75101 Unspecified rotator cuff tear or rupture of right shoulder, not specified as traumatic: Secondary | ICD-10-CM | POA: Diagnosis not present

## 2018-01-28 DIAGNOSIS — R55 Syncope and collapse: Secondary | ICD-10-CM | POA: Diagnosis not present

## 2018-01-28 DIAGNOSIS — G8929 Other chronic pain: Secondary | ICD-10-CM | POA: Diagnosis not present

## 2018-01-28 DIAGNOSIS — I739 Peripheral vascular disease, unspecified: Secondary | ICD-10-CM | POA: Diagnosis not present

## 2018-02-01 DIAGNOSIS — D539 Nutritional anemia, unspecified: Secondary | ICD-10-CM | POA: Diagnosis not present

## 2018-02-01 DIAGNOSIS — M5136 Other intervertebral disc degeneration, lumbar region: Secondary | ICD-10-CM | POA: Diagnosis not present

## 2018-02-01 DIAGNOSIS — I739 Peripheral vascular disease, unspecified: Secondary | ICD-10-CM | POA: Diagnosis not present

## 2018-02-01 DIAGNOSIS — M75101 Unspecified rotator cuff tear or rupture of right shoulder, not specified as traumatic: Secondary | ICD-10-CM | POA: Diagnosis not present

## 2018-02-01 DIAGNOSIS — R55 Syncope and collapse: Secondary | ICD-10-CM | POA: Diagnosis not present

## 2018-02-01 DIAGNOSIS — R69 Illness, unspecified: Secondary | ICD-10-CM | POA: Diagnosis not present

## 2018-02-01 DIAGNOSIS — E785 Hyperlipidemia, unspecified: Secondary | ICD-10-CM | POA: Diagnosis not present

## 2018-02-01 DIAGNOSIS — I10 Essential (primary) hypertension: Secondary | ICD-10-CM | POA: Diagnosis not present

## 2018-02-01 DIAGNOSIS — H409 Unspecified glaucoma: Secondary | ICD-10-CM | POA: Diagnosis not present

## 2018-02-01 DIAGNOSIS — G8929 Other chronic pain: Secondary | ICD-10-CM | POA: Diagnosis not present

## 2018-02-04 DIAGNOSIS — H401133 Primary open-angle glaucoma, bilateral, severe stage: Secondary | ICD-10-CM | POA: Diagnosis not present

## 2018-02-05 DIAGNOSIS — M5136 Other intervertebral disc degeneration, lumbar region: Secondary | ICD-10-CM | POA: Diagnosis not present

## 2018-02-05 DIAGNOSIS — M75101 Unspecified rotator cuff tear or rupture of right shoulder, not specified as traumatic: Secondary | ICD-10-CM | POA: Diagnosis not present

## 2018-02-05 DIAGNOSIS — E785 Hyperlipidemia, unspecified: Secondary | ICD-10-CM | POA: Diagnosis not present

## 2018-02-05 DIAGNOSIS — R69 Illness, unspecified: Secondary | ICD-10-CM | POA: Diagnosis not present

## 2018-02-05 DIAGNOSIS — D539 Nutritional anemia, unspecified: Secondary | ICD-10-CM | POA: Diagnosis not present

## 2018-02-05 DIAGNOSIS — H409 Unspecified glaucoma: Secondary | ICD-10-CM | POA: Diagnosis not present

## 2018-02-05 DIAGNOSIS — I739 Peripheral vascular disease, unspecified: Secondary | ICD-10-CM | POA: Diagnosis not present

## 2018-02-05 DIAGNOSIS — R55 Syncope and collapse: Secondary | ICD-10-CM | POA: Diagnosis not present

## 2018-02-05 DIAGNOSIS — I10 Essential (primary) hypertension: Secondary | ICD-10-CM | POA: Diagnosis not present

## 2018-02-05 DIAGNOSIS — G8929 Other chronic pain: Secondary | ICD-10-CM | POA: Diagnosis not present

## 2018-02-14 DIAGNOSIS — H401123 Primary open-angle glaucoma, left eye, severe stage: Secondary | ICD-10-CM | POA: Diagnosis not present

## 2018-02-17 ENCOUNTER — Other Ambulatory Visit: Payer: Self-pay | Admitting: Infectious Disease

## 2018-02-17 DIAGNOSIS — E785 Hyperlipidemia, unspecified: Secondary | ICD-10-CM

## 2018-02-17 DIAGNOSIS — K219 Gastro-esophageal reflux disease without esophagitis: Secondary | ICD-10-CM

## 2018-02-21 DIAGNOSIS — D539 Nutritional anemia, unspecified: Secondary | ICD-10-CM | POA: Diagnosis not present

## 2018-02-21 DIAGNOSIS — R55 Syncope and collapse: Secondary | ICD-10-CM | POA: Diagnosis not present

## 2018-02-21 DIAGNOSIS — E785 Hyperlipidemia, unspecified: Secondary | ICD-10-CM | POA: Diagnosis not present

## 2018-02-21 DIAGNOSIS — I10 Essential (primary) hypertension: Secondary | ICD-10-CM | POA: Diagnosis not present

## 2018-02-21 DIAGNOSIS — G8929 Other chronic pain: Secondary | ICD-10-CM | POA: Diagnosis not present

## 2018-02-21 DIAGNOSIS — I739 Peripheral vascular disease, unspecified: Secondary | ICD-10-CM | POA: Diagnosis not present

## 2018-02-21 DIAGNOSIS — M75101 Unspecified rotator cuff tear or rupture of right shoulder, not specified as traumatic: Secondary | ICD-10-CM | POA: Diagnosis not present

## 2018-02-21 DIAGNOSIS — M5136 Other intervertebral disc degeneration, lumbar region: Secondary | ICD-10-CM | POA: Diagnosis not present

## 2018-02-21 DIAGNOSIS — R69 Illness, unspecified: Secondary | ICD-10-CM | POA: Diagnosis not present

## 2018-02-21 DIAGNOSIS — H409 Unspecified glaucoma: Secondary | ICD-10-CM | POA: Diagnosis not present

## 2018-03-14 DIAGNOSIS — H401123 Primary open-angle glaucoma, left eye, severe stage: Secondary | ICD-10-CM | POA: Diagnosis not present

## 2018-03-20 ENCOUNTER — Other Ambulatory Visit (HOSPITAL_COMMUNITY)
Admission: RE | Admit: 2018-03-20 | Discharge: 2018-03-20 | Disposition: A | Discharge status: Home / Self Care | Payer: Medicare HMO | Source: Ambulatory Visit | Attending: Infectious Disease | Admitting: Infectious Disease

## 2018-03-20 ENCOUNTER — Other Ambulatory Visit: Payer: Medicare HMO

## 2018-03-20 DIAGNOSIS — I10 Essential (primary) hypertension: Secondary | ICD-10-CM | POA: Diagnosis not present

## 2018-03-20 DIAGNOSIS — Z113 Encounter for screening for infections with a predominantly sexual mode of transmission: Secondary | ICD-10-CM | POA: Insufficient documentation

## 2018-03-20 DIAGNOSIS — Z79899 Other long term (current) drug therapy: Secondary | ICD-10-CM

## 2018-03-20 DIAGNOSIS — R69 Illness, unspecified: Secondary | ICD-10-CM | POA: Diagnosis not present

## 2018-03-20 DIAGNOSIS — B20 Human immunodeficiency virus [HIV] disease: Secondary | ICD-10-CM | POA: Diagnosis present

## 2018-03-20 DIAGNOSIS — Z720 Tobacco use: Secondary | ICD-10-CM | POA: Insufficient documentation

## 2018-03-21 ENCOUNTER — Other Ambulatory Visit: Payer: Self-pay | Admitting: Infectious Disease

## 2018-03-21 ENCOUNTER — Other Ambulatory Visit: Payer: Medicare HMO

## 2018-03-21 DIAGNOSIS — E785 Hyperlipidemia, unspecified: Secondary | ICD-10-CM

## 2018-03-21 DIAGNOSIS — K219 Gastro-esophageal reflux disease without esophagitis: Secondary | ICD-10-CM

## 2018-03-21 LAB — T-HELPER CELL (CD4) - (RCID CLINIC ONLY)
CD4 % Helper T Cell: 41 % (ref 33–55)
CD4 T Cell Abs: 1310 /uL (ref 400–2700)

## 2018-03-21 LAB — URINE CYTOLOGY ANCILLARY ONLY
Chlamydia: NEGATIVE
Neisseria Gonorrhea: NEGATIVE

## 2018-03-22 LAB — COMPLETE METABOLIC PANEL WITH GFR
AG RATIO: 1.4 (calc) (ref 1.0–2.5)
ALBUMIN MSPROF: 4.4 g/dL (ref 3.6–5.1)
ALT: 6 U/L (ref 6–29)
AST: 12 U/L (ref 10–35)
Alkaline phosphatase (APISO): 63 U/L (ref 33–130)
BILIRUBIN TOTAL: 0.4 mg/dL (ref 0.2–1.2)
BUN: 10 mg/dL (ref 7–25)
CALCIUM: 9.9 mg/dL (ref 8.6–10.4)
CO2: 21 mmol/L (ref 20–32)
CREATININE: 0.65 mg/dL (ref 0.60–0.93)
Chloride: 115 mmol/L — ABNORMAL HIGH (ref 98–110)
GFR, EST AFRICAN AMERICAN: 101 mL/min/{1.73_m2} (ref >=60)
GFR, Est Non African American: 87 mL/min/{1.73_m2} (ref >=60)
GLOBULIN: 3.1 g/dL (ref 1.9–3.7)
Glucose, Bld: 88 mg/dL (ref 65–99)
Potassium: 3.4 mmol/L — ABNORMAL LOW (ref 3.5–5.3)
Sodium: 144 mmol/L (ref 135–146)
TOTAL PROTEIN: 7.5 g/dL (ref 6.1–8.1)

## 2018-03-22 LAB — CBC WITH DIFFERENTIAL/PLATELET
ABSOLUTE MONOCYTES: 299 {cells}/uL (ref 200–950)
BASOS PCT: 1.3 %
Basophils Absolute: 79 cells/uL (ref 0–200)
EOS ABS: 171 {cells}/uL (ref 15–500)
Eosinophils Relative: 2.8 %
HEMATOCRIT: 37.7 % (ref 35.0–45.0)
HEMOGLOBIN: 12.6 g/dL (ref 11.7–15.5)
LYMPHS ABS: 2965 {cells}/uL (ref 850–3900)
MCH: 33 pg (ref 27.0–33.0)
MCHC: 33.4 g/dL (ref 32.0–36.0)
MCV: 98.7 fL (ref 80.0–100.0)
MPV: 10 fL (ref 7.5–12.5)
Monocytes Relative: 4.9 %
NEUTROS ABS: 2586 {cells}/uL (ref 1500–7800)
Neutrophils Relative %: 42.4 %
PLATELETS: 349 10*3/uL (ref 140–400)
RBC: 3.82 10*6/uL (ref 3.80–5.10)
RDW: 11.8 % (ref 11.0–15.0)
TOTAL LYMPHOCYTE: 48.6 %
WBC: 6.1 10*3/uL (ref 3.8–10.8)

## 2018-03-22 LAB — LIPID PANEL
CHOLESTEROL: 132 mg/dL (ref <200)
HDL: 65 mg/dL (ref >50)
LDL CHOLESTEROL (CALC): 50 mg/dL
Non-HDL Cholesterol (Calc): 67 mg/dL (calc) (ref <130)
Total CHOL/HDL Ratio: 2 (calc) (ref <5.0)
Triglycerides: 88 mg/dL (ref <150)

## 2018-03-22 LAB — HIV-1 RNA QUANT-NO REFLEX-BLD
HIV 1 RNA Quant: <20 copies/mL
HIV-1 RNA Quant, Log: <1.3 Log copies/mL

## 2018-03-22 LAB — RPR: RPR Ser Ql: NONREACTIVE

## 2018-03-29 ENCOUNTER — Ambulatory Visit (INDEPENDENT_AMBULATORY_CARE_PROVIDER_SITE_OTHER): Payer: Medicare HMO | Admitting: Internal Medicine

## 2018-03-29 ENCOUNTER — Other Ambulatory Visit: Payer: Self-pay

## 2018-03-29 ENCOUNTER — Encounter: Payer: Self-pay | Admitting: Internal Medicine

## 2018-03-29 VITALS — BP 149/58 | HR 70 | Temp 97.4°F | Wt 124.5 lb

## 2018-03-29 DIAGNOSIS — Z95820 Peripheral vascular angioplasty status with implants and grafts: Secondary | ICD-10-CM

## 2018-03-29 DIAGNOSIS — E785 Hyperlipidemia, unspecified: Secondary | ICD-10-CM | POA: Diagnosis not present

## 2018-03-29 DIAGNOSIS — F17210 Nicotine dependence, cigarettes, uncomplicated: Secondary | ICD-10-CM

## 2018-03-29 DIAGNOSIS — Z720 Tobacco use: Secondary | ICD-10-CM

## 2018-03-29 DIAGNOSIS — Z79891 Long term (current) use of opiate analgesic: Secondary | ICD-10-CM

## 2018-03-29 DIAGNOSIS — I739 Peripheral vascular disease, unspecified: Secondary | ICD-10-CM | POA: Diagnosis not present

## 2018-03-29 DIAGNOSIS — Z23 Encounter for immunization: Secondary | ICD-10-CM

## 2018-03-29 DIAGNOSIS — R69 Illness, unspecified: Secondary | ICD-10-CM | POA: Diagnosis not present

## 2018-03-29 DIAGNOSIS — I10 Essential (primary) hypertension: Secondary | ICD-10-CM | POA: Diagnosis not present

## 2018-03-29 DIAGNOSIS — G8929 Other chronic pain: Secondary | ICD-10-CM | POA: Diagnosis not present

## 2018-03-29 DIAGNOSIS — M75101 Unspecified rotator cuff tear or rupture of right shoulder, not specified as traumatic: Secondary | ICD-10-CM | POA: Diagnosis not present

## 2018-03-29 DIAGNOSIS — Z79899 Other long term (current) drug therapy: Secondary | ICD-10-CM

## 2018-03-29 DIAGNOSIS — M5136 Other intervertebral disc degeneration, lumbar region: Secondary | ICD-10-CM

## 2018-03-29 DIAGNOSIS — F411 Generalized anxiety disorder: Secondary | ICD-10-CM

## 2018-03-29 DIAGNOSIS — E7800 Pure hypercholesterolemia, unspecified: Secondary | ICD-10-CM

## 2018-03-29 MED ORDER — TRAMADOL HCL 50 MG PO TABS: 100.0000 mg | ORAL_TABLET | Freq: Three times a day (TID) | ORAL | 5 refills | Status: DC | PRN

## 2018-03-29 MED ORDER — AMLODIPINE BESYLATE 10 MG PO TABS: 10.0000 mg | ORAL_TABLET | Freq: Every day | ORAL | 3 refills | Status: DC

## 2018-03-29 MED ORDER — KETOROLAC TROMETHAMINE 60 MG/2ML IM SOLN
60.0000 mg | Freq: Once | INTRAMUSCULAR | Status: AC
  Administered 2018-03-29: 60 mg via INTRAMUSCULAR

## 2018-03-29 MED ORDER — DICLOFENAC SODIUM 1 % TD GEL: 4.0000 g | Freq: Four times a day (QID) | TRANSDERMAL | 11 refills | Status: DC | PRN

## 2018-03-29 MED ORDER — ALPRAZOLAM 1 MG PO TABS: 1.0000 mg | ORAL_TABLET | Freq: Every evening | ORAL | 5 refills | Status: DC | PRN

## 2018-03-29 NOTE — Patient Instructions (Signed)
It was nice to see you again.  I am sorry you have been in pain.  1) We gave you a tetanus/diphtheria shot today as it was due.  2) We gave you a toradol shot today for pain.  3) I started tramadol 100 mg (2 tablets) by mouth every 8 hours as needed for pain (disp #180).  4) I referred you to a pain clinic to see if they had any other ideas to manage your chronic pain.  5) I renewed your xanax.  6) I sent a referral to the vascular clinic to check the blood flow in your legs again.  7) Keep taking the other medications as you are.  When you get the new amlodipine it will be back to 1 tablet (10 mg) daily instead of the two 5 mg tablets you are taking now.  8) I wrote for 3 Voltaren gel tubes per month.

## 2018-03-29 NOTE — Assessment & Plan Note (Addendum)
Assessment  She continues to have diffuse pain in her back and joints.  She gets some relief with Voltaren gel but no relief with Tylenol.  She states she has not been on any opiates in several months.  She is asking for something to help with her chronic pain as it is negatively impacting upon the quality of her life.  Plan  She will continue the Voltaren gel.  I have increased the dose to 4 g topically 4 times a day as needed.  This requires at least 3 tubes and the prescription was changed to reflect this.  We are avoiding opiates given the difficulties that she experienced previously with confusion over the dosing and acute delirium.  We will start tramadol 100 mg by mouth every 8 hours as needed for pain.  She was warned that this could also affect her mentation and that she was to take it no differently than was prescribed as needed.  We also mutually agreed that she may benefit from a pain clinic referral to see if there were other modalities to address her chronic pain that she could benefit from.  We will reassess the efficacy of the as needed tramadol at the follow-up visit and review any recommendations the chronic pain clinic may have provided in the interim.  Prior to leaving the office she received a IM injection of Toradol 60 mg x 1.

## 2018-03-29 NOTE — Assessment & Plan Note (Signed)
She received the tetanus/diphtheria shot today and a referral for mammography was placed since she did not have her mammography with the last referral.  She is otherwise up-to-date on her healthcare maintenance.

## 2018-03-29 NOTE — Assessment & Plan Note (Signed)
Assessment  She states that she recently has noted leg and calf pain with walking.  This pain resolves upon rest.  This is concerning for recurrence of her peripheral vascular occlusive disease as it sounds very much like claudication.  She is status post an aortobifem bypass in 2009.  Plan  I have placed a request for ABIs to assess blood flow to the lower extremities given our concerns for claudication.  We are also continuing to address her hypertension and hyperlipidemia and have asked to intervene with her tobacco abuse.  Further evaluation or intervention are pending the results of the ABIs.

## 2018-03-29 NOTE — Assessment & Plan Note (Signed)
Assessment  I asked if she was ready to quit smoking as I had some aids in which to assist in this attempt.  Unfortunately, she continues to remain in the pre-contemplative stage and is not interested in quitting at this time  Plan  We will reassess her interest in tobacco cessation at the follow-up visit.

## 2018-03-29 NOTE — Assessment & Plan Note (Signed)
Assessment  She denies any myalgias on the atorvastatin 10 mg by mouth daily.  Plan  We will continue with his high intensity statin and reassess for intolerances at the follow-up visit.

## 2018-03-29 NOTE — Assessment & Plan Note (Signed)
Assessment  Her blood pressure today was 149/58.  At the request of Dr. Wendie Agreste she has been following her blood pressures at home and they range anywhere from systolics of 741-287 with the majority being in the 130-150 range.  This is on benazepril 40 mg by mouth daily and amlodipine 10 mg by mouth daily.  During the most recent visit on this regimen her systolic blood pressure was 123.  Therefore, given her significant uncontrolled chronic pain, I am concerned that today's blood pressure and some of the higher blood pressures at home may be related to her chronic pain and if we are able to get better control of it, we may have an improvement in her blood pressure that is consistent and not requiring any further changes in her regimen.  Plan  We will continue with the benazepril at 40 mg by mouth daily and amlodipine at 10 mg by mouth daily.  We are aggressively addressing her pain as outlined in that section of this note.  We will reassess her blood pressure at the follow-up visit on this regimen with the hopes that the pain will be much better controlled.

## 2018-03-29 NOTE — Progress Notes (Signed)
   Subjective:    Patient ID: Jessica [name], female    DOB: [birth year], 78 y.o.   MRN: [id number]  HPI  Jessica [name] is here for follow-up of her lumbar degenerative disc disease, peripheral vascular occlusive disease, essential hypertension, hyperlipidemia, tobacco abuse, generalized anxiety disorder, and right rotator cuff tear. Please see the A&P for the status of the pt's chronic medical problems.  She is without acute complaints at this time.  Review of Systems  Constitutional: Negative for activity change, appetite change and unexpected weight change.  Respiratory: Negative for chest tightness and shortness of breath.   Cardiovascular: Negative for chest pain, palpitations and leg swelling.  Gastrointestinal: Negative for abdominal pain, nausea and vomiting.  Genitourinary: Negative for difficulty urinating.  Musculoskeletal: Positive for arthralgias and back pain. Negative for joint swelling and myalgias.  Skin: Negative for rash.  Psychiatric/Behavioral: Positive for sleep disturbance. Negative for confusion. The patient is nervous/anxious.       Objective:   Physical Exam Vitals signs and nursing note reviewed.  Constitutional:      General: She is not in acute distress.    Appearance: Normal appearance. She is normal weight. She is not ill-appearing, toxic-appearing or diaphoretic.  HENT:     Head: Normocephalic and atraumatic.  Eyes:     General: No scleral icterus.       Right eye: No discharge.        Left eye: No discharge.     Conjunctiva/sclera: Conjunctivae normal.  Musculoskeletal: Normal range of motion.        General: No swelling or deformity.     Right lower leg: No edema.     Left lower leg: No edema.  Skin:    General: Skin is warm and dry.     Findings: No erythema or rash.  Neurological:     Mental Status: She is alert and oriented to person, place, and time. Mental status is at baseline.     Motor: No weakness.   Coordination: Coordination normal.     Gait: Gait normal.  Psychiatric:        Mood and Affect: Mood normal.        Behavior: Behavior normal.        Thought Content: Thought content normal.        Judgment: Judgment normal.       Assessment & Plan:   Please see problem based charting.

## 2018-03-29 NOTE — Assessment & Plan Note (Signed)
Assessment  She continues to have right shoulder pain, but it is reasonably well controlled with the Voltaren gel.  Plan  We will increase the Voltaren gel 4 g every 6 hours as needed for her right rotator cuff tear and reassess the efficacy of this regimen at the follow-up visit.

## 2018-03-29 NOTE — Assessment & Plan Note (Signed)
Assessment  Her anxiety is symptomatically well controlled on the alprazolam 1 mg by mouth every 24 hours as needed.  She is tolerating this dose well without any episodes of confusion.  Plan  The controlled substance database for [facility] was reviewed and there are no red flags.  She was therefore represcribed the alprazolam 1 mg by mouth every 24 hours as needed for anxiety.  We will reassess the efficacy of this intervention in managing her anxiety at the follow-up visit.

## 2018-04-02 ENCOUNTER — Telehealth: Payer: Self-pay | Admitting: *Deleted

## 2018-04-02 NOTE — Telephone Encounter (Addendum)
Call to [employer]- [id number]  for PA for Tramadol 50 mg tablets.  Information was given.  Tramdol was dropped to a Tier 1 from a Tier 2.  Patient will be able to get Tramadol on Tier 1 starting 03/12/2018 thru 03/13/2019.  Sadie [phone].   Call to [facility] to inform them of the change and Prior Authorization.    Sander Nephew, RN 04/02/2018. 2:37 PM

## 2018-04-04 ENCOUNTER — Encounter: Payer: Medicare HMO | Admitting: Infectious Disease

## 2018-04-08 ENCOUNTER — Other Ambulatory Visit: Payer: Self-pay

## 2018-04-08 ENCOUNTER — Emergency Department (HOSPITAL_COMMUNITY): Payer: Medicare HMO

## 2018-04-08 ENCOUNTER — Other Ambulatory Visit (HOSPITAL_COMMUNITY): Payer: Self-pay

## 2018-04-08 ENCOUNTER — Encounter (HOSPITAL_COMMUNITY): Payer: Self-pay | Admitting: *Deleted

## 2018-04-08 ENCOUNTER — Observation Stay (HOSPITAL_COMMUNITY)
Admission: EM | Admit: 2018-04-08 | Discharge: 2018-04-09 | Disposition: A | Discharge status: Home / Self Care | Payer: Medicare HMO | Attending: Internal Medicine | Admitting: Internal Medicine

## 2018-04-08 DIAGNOSIS — Z79891 Long term (current) use of opiate analgesic: Secondary | ICD-10-CM | POA: Diagnosis not present

## 2018-04-08 DIAGNOSIS — E785 Hyperlipidemia, unspecified: Secondary | ICD-10-CM | POA: Diagnosis not present

## 2018-04-08 DIAGNOSIS — I998 Other disorder of circulatory system: Secondary | ICD-10-CM | POA: Diagnosis not present

## 2018-04-08 DIAGNOSIS — F17210 Nicotine dependence, cigarettes, uncomplicated: Secondary | ICD-10-CM | POA: Diagnosis not present

## 2018-04-08 DIAGNOSIS — R410 Disorientation, unspecified: Secondary | ICD-10-CM

## 2018-04-08 DIAGNOSIS — F17200 Nicotine dependence, unspecified, uncomplicated: Secondary | ICD-10-CM | POA: Diagnosis present

## 2018-04-08 DIAGNOSIS — B20 Human immunodeficiency virus [HIV] disease: Secondary | ICD-10-CM | POA: Diagnosis not present

## 2018-04-08 DIAGNOSIS — I10 Essential (primary) hypertension: Secondary | ICD-10-CM | POA: Diagnosis present

## 2018-04-08 DIAGNOSIS — N390 Urinary tract infection, site not specified: Principal | ICD-10-CM | POA: Diagnosis present

## 2018-04-08 DIAGNOSIS — F411 Generalized anxiety disorder: Secondary | ICD-10-CM | POA: Diagnosis present

## 2018-04-08 DIAGNOSIS — M5136 Other intervertebral disc degeneration, lumbar region: Secondary | ICD-10-CM | POA: Diagnosis not present

## 2018-04-08 DIAGNOSIS — Z7982 Long term (current) use of aspirin: Secondary | ICD-10-CM | POA: Diagnosis not present

## 2018-04-08 DIAGNOSIS — M549 Dorsalgia, unspecified: Secondary | ICD-10-CM | POA: Diagnosis present

## 2018-04-08 DIAGNOSIS — I739 Peripheral vascular disease, unspecified: Secondary | ICD-10-CM | POA: Diagnosis present

## 2018-04-08 DIAGNOSIS — R0602 Shortness of breath: Secondary | ICD-10-CM | POA: Diagnosis not present

## 2018-04-08 DIAGNOSIS — G9341 Metabolic encephalopathy: Secondary | ICD-10-CM | POA: Diagnosis present

## 2018-04-08 DIAGNOSIS — G9340 Encephalopathy, unspecified: Secondary | ICD-10-CM | POA: Diagnosis not present

## 2018-04-08 DIAGNOSIS — R69 Illness, unspecified: Secondary | ICD-10-CM | POA: Diagnosis not present

## 2018-04-08 DIAGNOSIS — R443 Hallucinations, unspecified: Secondary | ICD-10-CM | POA: Diagnosis present

## 2018-04-08 DIAGNOSIS — Z79899 Other long term (current) drug therapy: Secondary | ICD-10-CM | POA: Diagnosis not present

## 2018-04-08 DIAGNOSIS — Z720 Tobacco use: Secondary | ICD-10-CM

## 2018-04-08 LAB — URINALYSIS, ROUTINE W REFLEX MICROSCOPIC
Bilirubin Urine: NEGATIVE
Glucose, UA: NEGATIVE mg/dL
Ketones, ur: 5 mg/dL — AB
Leukocytes, UA: NEGATIVE
Nitrite: POSITIVE — AB
PROTEIN: 30 mg/dL — AB
Specific Gravity, Urine: 1.013 (ref 1.005–1.030)
pH: 6 (ref 5.0–8.0)

## 2018-04-08 LAB — COMPREHENSIVE METABOLIC PANEL
ALBUMIN: 3.8 g/dL (ref 3.5–5.0)
ALK PHOS: 52 U/L (ref 38–126)
ALT: 30 U/L (ref 0–44)
AST: 99 U/L — AB (ref 15–41)
Anion gap: 11 (ref 5–15)
BILIRUBIN TOTAL: 1.1 mg/dL (ref 0.3–1.2)
BUN: 14 mg/dL (ref 8–23)
CO2: 16 mmol/L — ABNORMAL LOW (ref 22–32)
Calcium: 9.5 mg/dL (ref 8.9–10.3)
Chloride: 112 mmol/L — ABNORMAL HIGH (ref 98–111)
Creatinine, Ser: 0.88 mg/dL (ref 0.44–1.00)
GFR calc Af Amer: >60 mL/min (ref >60)
GFR calc non Af Amer: >60 mL/min (ref >60)
GLUCOSE: 94 mg/dL (ref 70–99)
POTASSIUM: 3.6 mmol/L (ref 3.5–5.1)
Sodium: 139 mmol/L (ref 135–145)
TOTAL PROTEIN: 7.4 g/dL (ref 6.5–8.1)

## 2018-04-08 LAB — CBC WITH DIFFERENTIAL/PLATELET
Abs Immature Granulocytes: 0 10*3/uL (ref 0.00–0.07)
BASOS ABS: 0 10*3/uL (ref 0.0–0.1)
Basophils Relative: 0 %
Eosinophils Absolute: 0.1 10*3/uL (ref 0.0–0.5)
Eosinophils Relative: 1 %
HEMATOCRIT: 38.3 % (ref 36.0–46.0)
HEMOGLOBIN: 12.4 g/dL (ref 12.0–15.0)
LYMPHS ABS: 2.8 10*3/uL (ref 0.7–4.0)
LYMPHS PCT: 36 %
MCH: 32.6 pg (ref 26.0–34.0)
MCHC: 32.4 g/dL (ref 30.0–36.0)
MCV: 100.8 fL — ABNORMAL HIGH (ref 80.0–100.0)
Monocytes Absolute: 0.5 10*3/uL (ref 0.1–1.0)
Monocytes Relative: 6 %
NEUTROS PCT: 57 %
NRBC: 0 % (ref 0.0–0.2)
NRBC: 0 /100{WBCs}
Neutro Abs: 4.5 10*3/uL (ref 1.7–7.7)
Platelets: 289 10*3/uL (ref 150–400)
RBC: 3.8 MIL/uL — ABNORMAL LOW (ref 3.87–5.11)
RDW: 12.3 % (ref 11.5–15.5)
WBC: 7.9 10*3/uL (ref 4.0–10.5)

## 2018-04-08 LAB — AMMONIA: AMMONIA: 13 umol/L (ref 9–35)

## 2018-04-08 LAB — LACTIC ACID, PLASMA
Lactic Acid, Venous: 1.1 mmol/L (ref 0.5–1.9)
Lactic Acid, Venous: 1.7 mmol/L (ref 0.5–1.9)

## 2018-04-08 MED ORDER — AMLODIPINE BESYLATE 10 MG PO TABS
10.0000 mg | ORAL_TABLET | Freq: Every day | ORAL | Status: DC
  Administered 2018-04-09: 10 mg via ORAL
  Filled 2018-04-08: qty 1

## 2018-04-08 MED ORDER — SODIUM CHLORIDE 0.9 % IV SOLN
INTRAVENOUS | Status: DC
  Administered 2018-04-08: 16:00:00 via INTRAVENOUS

## 2018-04-08 MED ORDER — ATORVASTATIN CALCIUM 10 MG PO TABS
10.0000 mg | ORAL_TABLET | Freq: Every day | ORAL | Status: DC
  Administered 2018-04-09: 10 mg via ORAL
  Filled 2018-04-08: qty 1

## 2018-04-08 MED ORDER — PANTOPRAZOLE SODIUM 40 MG PO TBEC
40.0000 mg | DELAYED_RELEASE_TABLET | Freq: Every day | ORAL | Status: DC
  Administered 2018-04-09: 40 mg via ORAL
  Filled 2018-04-08: qty 1

## 2018-04-08 MED ORDER — BICTEGRAVIR-EMTRICITAB-TENOFOV 50-200-25 MG PO TABS
1.0000 | ORAL_TABLET | Freq: Every day | ORAL | Status: DC
  Administered 2018-04-09: 1 via ORAL
  Filled 2018-04-08 (×2): qty 1

## 2018-04-08 MED ORDER — ACETAMINOPHEN 325 MG PO TABS: 650.0000 mg | ORAL_TABLET | Freq: Four times a day (QID) | ORAL | Status: DC | PRN

## 2018-04-08 MED ORDER — LATANOPROST 0.005 % OP SOLN
1.0000 [drp] | Freq: Every day | OPHTHALMIC | Status: DC
  Administered 2018-04-08: 1 [drp] via OPHTHALMIC
  Filled 2018-04-08: qty 2.5

## 2018-04-08 MED ORDER — CALCIUM CARBONATE-VITAMIN D 500-200 MG-UNIT PO TABS
1.0000 | ORAL_TABLET | Freq: Two times a day (BID) | ORAL | Status: DC
  Administered 2018-04-08 – 2018-04-09 (×2): 1 via ORAL
  Filled 2018-04-08 (×2): qty 1

## 2018-04-08 MED ORDER — ASPIRIN EC 81 MG PO TBEC
81.0000 mg | DELAYED_RELEASE_TABLET | Freq: Every day | ORAL | Status: DC
  Administered 2018-04-09: 81 mg via ORAL
  Filled 2018-04-08: qty 1

## 2018-04-08 MED ORDER — ACETAMINOPHEN 650 MG RE SUPP: 650.0000 mg | Freq: Four times a day (QID) | RECTAL | Status: DC | PRN

## 2018-04-08 MED ORDER — BRIMONIDINE TARTRATE 0.2 % OP SOLN
1.0000 [drp] | Freq: Two times a day (BID) | OPHTHALMIC | Status: DC
  Administered 2018-04-08 – 2018-04-09 (×2): 1 [drp] via OPHTHALMIC
  Filled 2018-04-08: qty 5

## 2018-04-08 MED ORDER — ALPRAZOLAM 0.5 MG PO TABS: 1.0000 mg | ORAL_TABLET | Freq: Every evening | ORAL | Status: DC | PRN

## 2018-04-08 MED ORDER — TRAMADOL HCL 50 MG PO TABS
25.0000 mg | ORAL_TABLET | Freq: Three times a day (TID) | ORAL | Status: DC | PRN
  Administered 2018-04-08 – 2018-04-09 (×2): 25 mg via ORAL
  Filled 2018-04-08 (×2): qty 1

## 2018-04-08 MED ORDER — DORZOLAMIDE HCL 2 % OP SOLN
1.0000 [drp] | Freq: Two times a day (BID) | OPHTHALMIC | Status: DC
  Administered 2018-04-08 – 2018-04-09 (×2): 1 [drp] via OPHTHALMIC
  Filled 2018-04-08: qty 10

## 2018-04-08 MED ORDER — BRIMONIDINE TARTRATE-TIMOLOL 0.2-0.5 % OP SOLN: 1.0000 [drp] | Freq: Two times a day (BID) | OPHTHALMIC | Status: DC

## 2018-04-08 MED ORDER — SODIUM CHLORIDE 0.9 % IV SOLN: 1.0000 g | Freq: Once | INTRAVENOUS | Status: DC

## 2018-04-08 MED ORDER — DOCUSATE SODIUM 100 MG PO CAPS
100.0000 mg | ORAL_CAPSULE | Freq: Two times a day (BID) | ORAL | Status: DC
  Administered 2018-04-08 – 2018-04-09 (×2): 100 mg via ORAL
  Filled 2018-04-08 (×2): qty 1

## 2018-04-08 MED ORDER — TERAZOSIN HCL 5 MG PO CAPS
5.0000 mg | ORAL_CAPSULE | Freq: Every day | ORAL | Status: DC
  Administered 2018-04-08: 5 mg via ORAL
  Filled 2018-04-08 (×2): qty 1

## 2018-04-08 MED ORDER — ENOXAPARIN SODIUM 40 MG/0.4ML ~~LOC~~ SOLN
40.0000 mg | SUBCUTANEOUS | Status: DC
  Administered 2018-04-08: 40 mg via SUBCUTANEOUS
  Filled 2018-04-08: qty 0.4

## 2018-04-08 MED ORDER — CIPROFLOXACIN IN D5W 400 MG/200ML IV SOLN
400.0000 mg | Freq: Two times a day (BID) | INTRAVENOUS | Status: DC
  Administered 2018-04-08 – 2018-04-09 (×2): 400 mg via INTRAVENOUS
  Filled 2018-04-08 (×2): qty 200

## 2018-04-08 MED ORDER — DICLOFENAC SODIUM 1 % TD GEL
2.0000 g | Freq: Four times a day (QID) | TRANSDERMAL | Status: DC | PRN
  Filled 2018-04-08: qty 100

## 2018-04-08 MED ORDER — ADULT MULTIVITAMIN W/MINERALS CH
1.0000 | ORAL_TABLET | Freq: Every day | ORAL | Status: DC
  Administered 2018-04-09: 1 via ORAL
  Filled 2018-04-08: qty 1

## 2018-04-08 MED ORDER — TIMOLOL MALEATE 0.5 % OP SOLN
1.0000 [drp] | Freq: Two times a day (BID) | OPHTHALMIC | Status: DC
  Administered 2018-04-08 – 2018-04-09 (×2): 1 [drp] via OPHTHALMIC
  Filled 2018-04-08: qty 5

## 2018-04-08 MED ORDER — BENAZEPRIL HCL 40 MG PO TABS
40.0000 mg | ORAL_TABLET | Freq: Every day | ORAL | Status: DC
  Administered 2018-04-09: 40 mg via ORAL
  Filled 2018-04-08: qty 1

## 2018-04-08 NOTE — ED Notes (Signed)
Lab is in the process of locating pt's CBC

## 2018-04-08 NOTE — ED Notes (Signed)
Attempted report x1. 

## 2018-04-08 NOTE — ED Notes (Signed)
Daughter at bedside states pt has become increasingly altered since Friday.   Daughter found pt sitting in her closet, when daughter asked why she was there, pt replied "I don't know".

## 2018-04-08 NOTE — H&P (Signed)
History and Physical    Kendria Halberg PYK:[id number] DOB: 04-27-1940 DOA: 04/08/2018  PCP: Oval Linsey, MD  Patient coming from: home   I have personally briefly reviewed patient's old medical records available.   Chief Complaint: confused found by daughter.   HPI: Jessica [name] is a 78 y.o. female with medical history significant of recurrent UTI, history of urinary retention, HIV, chronic pain issues, benzodiazepine use, peripheral vascular disease and hypertension who presents to the emergency room with her daughter when she found her to be confused at home.  Patient herself is poor historian.  She states that she had some pain on her left side of the groin so she came to the ER.  Patient herself is denying any symptoms.  Patient states that her daughter was concerned that she was acting weird.According to the daughter, patient since last night was not herself.  She was hallucinating.  She was picking up at things that are not there and she was talking to people that were not there.  She was sleeping on the floor.  Patient denies any fever or chills.  Denies any nausea, vomiting, chest pain, shortness of breath.  Denies any abdominal pain.  She does have groin and leg pain, she used to be on heavy narcotics that is now tapered off and she is on tramadol 100 mg 3 times a day as of recent last week.  Daughter thinks she might have taken some extra medications.  Daughter does keep the medications with her. Patient does have history of similar episodes when she had UTI in the past.  Latest she had Pseudomonas UTI and 12/2017.  Before that she was admitted with altered mental status from UTI and urinary retention.  Patient also was noticed to be stumbling at home. ED Course: Hemodynamically stable.  Patient is alert oriented.  She knows everything going on.  Unsure how much of this is altered mentation.  Urinalysis showed abnormal urine.  Chest x-ray is normal.  Lactate is normal.   WBC count is normal.  Review of Systems: As per HPI otherwise 10 point review of systems negative.    Past Medical History:  Diagnosis Date  . Acute renal failure (ARF) ([place]) 08/17/2017  . Anxiety 04/05/2012  . Bilateral sensorineural hearing loss 06/11/2014   Mild to moderate on the left side and slight to mild on the right side per audiometry 05/2014.  Hearing aides with possible masking of tinnitus recommended but patient wished to defer secondary to finances.  . Blood transfusion without reported diagnosis    pt denies  . Bursitis of right shoulder 07/12/2012   s/p shoulder injection 07/12/2012   . Cataract of right eye   . Constipation due to pain medication 04/27/2010  . Diverticulosis 02/08/2012   Extensive left-sided diverticula on colonoscopy March 2012 per Dr. Gala Romney   . Essential hypertension 07/20/2006  . Genital herpes 07/20/2006  . 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 Heart murmur 1961  . Human immunodeficiency virus disease ([place]) 03/27/1986  . Hyperlipidemia LDL goal < 100 04/05/2012  . Long-term current use of opiate analgesic 03/17/2016  . Lumbar degenerative disc disease 07/20/2006   With chronic back pain   . Marijuana use 07/03/2016  . Micturition syncope 09/20/2015  . 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[place]) 11/01/2011   s/p aortobifem bypass 2009   . Periumbilical hernia 07/13/9765   1 cm left periumbilical abdominal wall defect  . 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 Right rotator cuff tear 02/01/2013   Responds to periodic steroid injections  . Seborrhea 09/01/2010  . Shoulder pain, right 12/04/2017  . Small bowel obstruction due to adhesions ([place]) 02/08/2012   s/p Exploratory laparotomy, lysis of adhesions 02/12/12    . 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 Tobacco abuse 02/19/2012  . Tobacco abuse   . Vasovagal syncope 02/15/2015  . Voiding dysfunction    s/p cystoscopy and meatal dilation Dec 2005    Past  Surgical History:  Procedure Laterality Date  . ABDOMINAL HYSTERECTOMY    . AORTO-FEMORAL BYPASS GRAFT  04/2007  . APPENDECTOMY    . BREAST SURGERY     Breast biopsy: negative  . CHOLECYSTECTOMY    . COLECTOMY  01/2011   Dr. Margot Chimes; "took out 12 inches of small intestiines and removed blockage"  . EYE SURGERY    . LAPAROTOMY  02/12/2012   Procedure: EXPLORATORY LAPAROTOMY;  Surgeon: Stark Klein, MD;  Location: [facility] OR;  Service: General;  Laterality: N/A;  Exploratory Laparotomy, lysis of adhesions  . SMALL INTESTINE SURGERY       reports that she has been smoking cigarettes. She has a 25.00 pack-year smoking history. She has never used smokeless tobacco. She reports that she does not drink alcohol or use drugs.  Allergies  Allergen Reactions  . Hctz [Hydrochlorothiazide] Other (See Comments)    Dizziness, syncope; does NOT wish to take anymore    Family History  Problem Relation Age of Onset  . Kidney failure Mother   . Diabetes Mother   . Hypertension Mother   . Heart disease Mother   . Glaucoma Father   . Congestive Heart Failure Sister   . Diabetes Sister   . Kidney disease Sister   . Diabetes Brother   . Unexplained death Brother 58       Automobile accident  . Hypothyroidism Daughter   . Arthritis Daughter        Neck/Back  . Healthy Son   . HIV/AIDS Brother   . HIV Daughter   . Kidney disease Daughter   . Arthritis Son        Knee     Prior to Admission medications   Medication Sig Start Date End Date Taking? Authorizing Provider  alendronate (FOSAMAX) 70 MG tablet Take 1 tablet (70 mg total) by mouth once a week. Patient taking differently: Take 70 mg by mouth every Sunday.  07/02/17  Yes Oval Linsey, MD  ALPRAZolam Duanne Moron) 1 MG tablet Take 1 tablet (1 mg total) by mouth at bedtime as needed for anxiety. 03/29/18  Yes Oval Linsey, MD  amLODipine (NORVASC) 10 MG tablet Take 1 tablet (10 mg total) by mouth daily. 03/29/18  Yes Oval Linsey, MD    aspirin 81 MG EC tablet Take 81 mg by mouth daily.     Yes [provider]  atorvastatin (LIPITOR) 10 MG tablet TAKE 1 TABLET BY MOUTH EVERY DAY (STOP LOVASTATIN) Patient taking differently: Take 10 mg by mouth daily.  03/21/18  Yes Tommy Medal, Lavell Islam, MD  benazepril (LOTENSIN) 40 MG tablet TAKE ONE TABLET BY MOUTH DAILY Patient taking differently: Take 40 mg by mouth daily.  07/19/17  Yes Annia Belt, MD  bictegravir-emtricitabine-tenofovir AF (BIKTARVY) 50-200-25 MG TABS tablet Take 1 tablet by mouth daily. 10/02/17  Yes Tommy Medal, Lavell Islam, MD  calcium-vitamin D (OSCAL WITH D) 500-200 MG-UNIT per tablet Take 1 tablet  by mouth 2 (two) times daily.   Yes [provider]  COMBIGAN 0.2-0.5 % ophthalmic solution Place 1 drop into the left eye 2 (two) times daily.  01/06/13  Yes [provider]  diclofenac sodium (VOLTAREN) 1 % GEL Apply 4 g topically 4 (four) times daily as needed. APPLY 2 GRAMS TO AFFECTED AREAS FOUR TIMES DAILY AS NEEDED FOR PAIN Patient taking differently: Apply 2 g topically 4 (four) times daily as needed (pain).  03/29/18  Yes Oval Linsey, MD  docusate sodium (COLACE) 100 MG capsule Take 100 mg by mouth 2 (two) times daily.   Yes [provider]  dorzolamide (TRUSOPT) 2 % ophthalmic solution Place 1 drop into both eyes 2 (two) times daily. 08/03/17  Yes [provider]  latanoprost (XALATAN) 0.005 % ophthalmic solution Place 1 drop into both eyes at bedtime. 08/16/17  Yes [provider]  Multiple Vitamin (MULTIVITAMIN WITH MINERALS) TABS Take 1 tablet by mouth daily.   Yes [provider]  pantoprazole (PROTONIX) 40 MG tablet TAKE 1 TABLET BY MOUTH EVERY DAY Patient taking differently: Take 40 mg by mouth daily.  03/21/18  Yes Tommy Medal, Lavell Islam, MD  terazosin (HYTRIN) 5 MG capsule Take 5 mg by mouth at bedtime. 03/28/18  Yes [provider]  traMADol (ULTRAM) 50 MG tablet Take 2 tablets (100 mg  total) by mouth every 8 (eight) hours as needed for severe pain. 03/29/18  Yes Oval Linsey, MD  triamcinolone ointment (KENALOG) 0.1 % Apply 1 application topically 2 (two) times daily as needed (rash).   Yes [provider]    Physical Exam: Vitals:   04/08/18 1000 04/08/18 1015 04/08/18 1030 04/08/18 1045  BP: (!) 154/66 (!) 167/64    Pulse: 77 78 78 78  Resp: '16 14 16 ' (!) 21  Temp:      TempSrc:      SpO2: 99% 99% 99% 100%  Weight:      Height:        Constitutional: NAD, calm, comfortable Vitals:   04/08/18 1000 04/08/18 1015 04/08/18 1030 04/08/18 1045  BP: (!) 154/66 (!) 167/64    Pulse: 77 78 78 78  Resp: '16 14 16 ' (!) 21  Temp:      TempSrc:      SpO2: 99% 99% 99% 100%  Weight:      Height:       Eyes: PERRL, lids and conjunctivae normal ENMT: Mucous membranes are moist. Posterior pharynx clear of any exudate or lesions.Normal dentition.  Neck: normal, supple, no masses, no thyromegaly Respiratory: clear to auscultation bilaterally, no wheezing, no crackles. Normal respiratory effort. No accessory muscle use.  Cardiovascular: Regular rate and rhythm, no murmurs / rubs / gallops. No extremity edema. 2+ pedal pulses. No carotid bruits.  Abdomen: no tenderness, no masses palpated. No hepatosplenomegaly. Bowel sounds positive.  Musculoskeletal: no clubbing / cyanosis. No joint deformity upper and lower extremities. Good ROM, no contractures. Normal muscle tone.  Skin: no rashes, lesions, ulcers. No induration Neurologic: CN 2-12 grossly intact. Sensation intact, DTR normal. Strength 5/5 in all 4.  Psychiatric: Normal judgment and insight. Alert and oriented x 3. Normal mood.     Labs on Admission: I have personally reviewed following labs and imaging studies  CBC: Recent Labs  Lab 04/08/18 1053  WBC 7.9  NEUTROABS 4.5  HGB 12.4  HCT 38.3  MCV 100.8*  PLT 016   Basic Metabolic Panel: Recent Labs  Lab 04/08/18 1053  NA  139  K 3.6  CL 112*    CO2 16*  GLUCOSE 94  BUN 14  CREATININE 0.88  CALCIUM 9.5   GFR: Estimated Creatinine Clearance: 49.4 mL/min (by C-G formula based on SCr of 0.88 mg/dL). Liver Function Tests: Recent Labs  Lab 04/08/18 1053  AST 99*  ALT 30  ALKPHOS 52  BILITOT 1.1  PROT 7.4  ALBUMIN 3.8   No results for input(s): LIPASE, AMYLASE in the last 168 hours. Recent Labs  Lab 04/08/18 1048  AMMONIA 13   Coagulation Profile: No results for input(s): INR, PROTIME in the last 168 hours. Cardiac Enzymes: No results for input(s): CKTOTAL, CKMB, CKMBINDEX, TROPONINI in the last 168 hours. BNP (last 3 results) No results for input(s): PROBNP in the last 8760 hours. HbA1C: No results for input(s): HGBA1C in the last 72 hours. CBG: No results for input(s): GLUCAP in the last 168 hours. Lipid Profile: No results for input(s): CHOL, HDL, LDLCALC, TRIG, CHOLHDL, LDLDIRECT in the last 72 hours. Thyroid Function Tests: No results for input(s): TSH, T4TOTAL, FREET4, T3FREE, THYROIDAB in the last 72 hours. Anemia Panel: No results for input(s): VITAMINB12, FOLATE, FERRITIN, TIBC, IRON, RETICCTPCT in the last 72 hours. Urine analysis:    Component Value Date/Time   COLORURINE YELLOW 04/08/2018 0950   APPEARANCEUR HAZY (A) 04/08/2018 0950   APPEARANCEUR Clear 01/04/2018 1125   LABSPEC 1.013 04/08/2018 0950   PHURINE 6.0 04/08/2018 0950   GLUCOSEU NEGATIVE 04/08/2018 0950   GLUCOSEU NEG mg/dL 07/15/2009 2025   HGBUR MODERATE (A) 04/08/2018 0950   HGBUR trace-intact 01/15/2008 1032   BILIRUBINUR NEGATIVE 04/08/2018 0950   BILIRUBINUR Negative 01/04/2018 1125   KETONESUR 5 (A) 04/08/2018 0950   PROTEINUR 30 (A) 04/08/2018 0950   UROBILINOGEN 1.0 09/11/2017 1529   UROBILINOGEN 0.2 09/03/2014 2205   NITRITE POSITIVE (A) 04/08/2018 0950   LEUKOCYTESUR NEGATIVE 04/08/2018 0950   LEUKOCYTESUR 2+ (A) 01/04/2018 1125    Radiological Exams on Admission: Dg Chest 2 View  Result Date:  04/08/2018 CLINICAL DATA:  Altered mental status ,,sob,smokerams EXAM: CHEST - 2 VIEW COMPARISON:  01/04/2018 FINDINGS: Normal mediastinum and cardiac silhouette. Normal pulmonary vasculature. No evidence of effusion, infiltrate, or pneumothorax. No acute bony abnormality. IMPRESSION: No acute cardiopulmonary process. Electronically Signed   By: Suzy Bouchard M.D.   On: 04/08/2018 11:17   Ct Head Wo Contrast  Result Date: 04/08/2018 CLINICAL DATA:  Encephalopathy. EXAM: CT HEAD WITHOUT CONTRAST TECHNIQUE: Contiguous axial images were obtained from the base of the skull through the vertex without intravenous contrast. COMPARISON:  11/12/2017 FINDINGS: Brain: No evidence of acute infarction, hemorrhage, hydrocephalus, extra-axial collection or mass lesion/mass effect. Vascular: No hyperdense vessel or unexpected calcification. Skull: Normal. Negative for fracture or focal lesion. Sinuses/Orbits: No acute finding. Other: None IMPRESSION: 1. No acute intracranial abnormalities. 2. 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   On: 04/08/2018 10:26    EKG: Independently reviewed.  Normal sinus rhythm.  No acute ST-T wave changes.  Comparable to previous EKGs.  Assessment/Plan Principal Problem:   Acute lower UTI Active Problems:   Human immunodeficiency virus disease ([place])   Essential hypertension   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[place])   Tobacco abuse   Generalized anxiety disorder   Acute metabolic encephalopathy   UTI (urinary tract infection)   Acute lower UTI: Suspected.  Urine culture sent.  Previous history of pansensitive Pseudomonas.  Will stop Rocephin, will treat with ciprofloxacin IV  until cultures.  Because of history of urinary retention, will check postvoid residual.  Encephalopathy: Suspect more likely from polypharmacy and less likely from infection.  CT head negative.  No focal deficits.  Patient already  improving.  Hydrate.  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  Will decrease dose of tramadol further.  She can use alprazolam at night that she is using for long time.  HIV disease: Well controlled.  She is on Biktarvy that she will continue.  Hypertension: Blood pressures are stable.  Resume home medications.  Smoking: Patient continues to smoke.  Patient is not interested to quit the smoking.  We discussed in brief about adverse health outcomes with continuous smoking.  Counseling provided.  Patient will be provided with written counseling and instructions for smoking cessation. Nicotine patch not needed as per patient.  Keep in observation.  Work with PT OT.  If adequate improvement, probable discharge home on oral antibiotics with outpatient follow-up. Met daughter at the bedside.  Will discuss about further de-escalating doses of tramadol and she agrees.  DVT prophylaxis: Lovenox. Code Status: Full code. Family Communication: Daughter at the bedside. Disposition Plan: Home.   Consults called: None. Admission status: Observation.   Barb Merino MD [facility] Pager [phone]  If 7PM-7AM, please contact night-coverage [email or web address] Password [facility]  04/08/2018, 2:41 PM

## 2018-04-08 NOTE — ED Provider Notes (Signed)
[facility] EMERGENCY DEPARTMENT Provider Note   CSN: [id number] Arrival date & time: 04/08/18  0825     History   Chief Complaint Chief Complaint  Patient presents with  . Back Pain    HPI Jessica [name] is a 78 y.o. female.  HPI   For the past few days has been confused, not herself Reports she is seeing things that aren't there, hallucinating, picking at things that aren't there, talking to people that aren't there, confused,  Found her sleeping on the floor, kept laying on the floor  Put on Tramadol 100mg  BID, daughter administers medications but daughter reports she knows where the medicine is and she saw her going to get it. Patient states she "may have" taken extra medications but she is confused and unclear if she is reliable historian.    During assessment, reports she saw at having babies at home but daughter reports they have no cat  Daughter reports similar to UTIs she has had in the past  [place] stumbling the day before yesterday, small headaches better with tylenol, no nausea or vomiting.  Lower abdominal pain per pt (daughter did not know about it), has not been going on very long,  No diarrhea., Chronic constipation. No fevers.  Occasional cough. No sore throat. Chronic mucus.     Past Medical History:  Diagnosis Date  . Acute renal failure (ARF) ([place]) 08/17/2017  . Anxiety 04/05/2012  . Bilateral sensorineural hearing loss 06/11/2014   Mild to moderate on the left side and slight to mild on the right side per audiometry 05/2014.  Hearing aides with possible masking of tinnitus recommended but patient wished to defer secondary to finances.  . Blood transfusion without reported diagnosis    pt denies  . Bursitis of right shoulder 07/12/2012   s/p shoulder injection 07/12/2012   . Cataract of right eye   . Constipation due to pain medication 04/27/2010  . Diverticulosis 02/08/2012   Extensive left-sided diverticula on colonoscopy March 2012  per Dr. Gala Romney   . Essential hypertension 07/20/2006  . Genital herpes 07/20/2006  . 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 Heart murmur 1961  . Human immunodeficiency virus disease ([place]) 03/27/1986  . Hyperlipidemia LDL goal < 100 04/05/2012  . Long-term current use of opiate analgesic 03/17/2016  . Lumbar degenerative disc disease 07/20/2006   With chronic back pain   . Marijuana use 07/03/2016  . Micturition syncope 09/20/2015  . 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[place]) 11/01/2011   s/p aortobifem bypass 2009   . Periumbilical hernia 03/16/4816   1 cm left periumbilical abdominal wall defect  . 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 Right rotator cuff tear 02/01/2013   Responds to periodic steroid injections  . Seborrhea 09/01/2010  . Shoulder pain, right 12/04/2017  . Small bowel obstruction due to adhesions ([place]) 02/08/2012   s/p Exploratory laparotomy, lysis of adhesions 02/12/12    . 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 Tobacco abuse 02/19/2012  . Tobacco abuse   . Vasovagal syncope 02/15/2015  . Voiding dysfunction    s/p cystoscopy and meatal dilation Dec 2005    Patient Active Problem List   Diagnosis Date Noted  . Acute lower UTI 04/08/2018  . Acute metabolic encephalopathy 56/31/4970  . UTI (urinary tract infection) 04/08/2018  . Diastolic dysfunction 26/37/8588  . Marijuana use 07/03/2016  . Bilateral sensorineural hearing loss 06/11/2014  .  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 Chronic sinusitis 04/02/2013  . Right rotator cuff tear 02/01/2013  . 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 Generalized anxiety disorder 04/05/2012  . Healthcare maintenance 04/05/2012  . Hyperlipidemia 04/05/2012  . Tobacco abuse 02/19/2012  . History of small bowel obstruction 02/08/2012  . Diverticulosis of colon without hemorrhage 02/08/2012  . 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[place]) 11/01/2011  . Seborrhea  09/01/2010  . Constipation due to pain medication 04/27/2010  . Genital herpes 07/20/2006  . 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 Essential hypertension 07/20/2006  . Lumbar degenerative disc disease 07/20/2006  . ABFND, PAP SMEAR, CERVIX/CERVICAL HPV NEC 07/20/2006  . Human immunodeficiency virus disease ([place]) 03/27/1986    Past Surgical History:  Procedure Laterality Date  . ABDOMINAL HYSTERECTOMY    . AORTO-FEMORAL BYPASS GRAFT  04/2007  . APPENDECTOMY    . BREAST SURGERY     Breast biopsy: negative  . CHOLECYSTECTOMY    . COLECTOMY  01/2011   Dr. Margot Chimes; "took out 12 inches of small intestiines and removed blockage"  . EYE SURGERY    . LAPAROTOMY  02/12/2012   Procedure: EXPLORATORY LAPAROTOMY;  Surgeon: Stark Klein, MD;  Location: [facility] OR;  Service: General;  Laterality: N/A;  Exploratory Laparotomy, lysis of adhesions  . SMALL INTESTINE SURGERY       OB History    Gravida  6   Para  4   Term  4   Preterm      AB  2   Living  4     SAB  2   TAB      Ectopic      Multiple      Live Births               Home Medications    Prior to Admission medications   Medication Sig Start Date End Date Taking? Authorizing Provider  alendronate (FOSAMAX) 70 MG tablet Take 1 tablet (70 mg total) by mouth once a week. Patient taking differently: Take 70 mg by mouth every Sunday.  07/02/17  Yes Oval Linsey, MD  ALPRAZolam Duanne Moron) 1 MG tablet Take 1 tablet (1 mg total) by mouth at bedtime as needed for anxiety. 03/29/18  Yes Oval Linsey, MD  amLODipine (NORVASC) 10 MG tablet Take 1 tablet (10 mg total) by mouth daily. 03/29/18  Yes Oval Linsey, MD  aspirin 81 MG EC tablet Take 81 mg by mouth daily.     Yes [provider]  atorvastatin (LIPITOR) 10 MG tablet TAKE 1 TABLET BY MOUTH EVERY DAY (STOP LOVASTATIN) Patient taking differently: Take 10 mg by mouth daily.  03/21/18  Yes Tommy Medal, Lavell Islam, MD  benazepril (LOTENSIN) 40 MG tablet TAKE ONE TABLET  BY MOUTH DAILY Patient taking differently: Take 40 mg by mouth daily.  07/19/17  Yes Annia Belt, MD  bictegravir-emtricitabine-tenofovir AF (BIKTARVY) 50-200-25 MG TABS tablet Take 1 tablet by mouth daily. 10/02/17  Yes Tommy Medal, Lavell Islam, MD  calcium-vitamin D (OSCAL WITH D) 500-200 MG-UNIT per tablet Take 1 tablet by mouth 2 (two) times daily.   Yes [provider]  COMBIGAN 0.2-0.5 % ophthalmic solution Place 1 drop into the left eye 2 (two) times daily.  01/06/13  Yes [provider]  diclofenac sodium (VOLTAREN) 1 % GEL Apply 4 g topically 4 (four) times daily as needed. APPLY 2 GRAMS TO AFFECTED AREAS FOUR TIMES DAILY AS NEEDED FOR PAIN Patient taking differently: Apply 2 g  topically 4 (four) times daily as needed (pain).  03/29/18  Yes Oval Linsey, MD  docusate sodium (COLACE) 100 MG capsule Take 100 mg by mouth 2 (two) times daily.   Yes [provider]  dorzolamide (TRUSOPT) 2 % ophthalmic solution Place 1 drop into both eyes 2 (two) times daily. 08/03/17  Yes [provider]  latanoprost (XALATAN) 0.005 % ophthalmic solution Place 1 drop into both eyes at bedtime. 08/16/17  Yes [provider]  Multiple Vitamin (MULTIVITAMIN WITH MINERALS) TABS Take 1 tablet by mouth daily.   Yes [provider]  pantoprazole (PROTONIX) 40 MG tablet TAKE 1 TABLET BY MOUTH EVERY DAY Patient taking differently: Take 40 mg by mouth daily.  03/21/18  Yes Tommy Medal, Lavell Islam, MD  terazosin (HYTRIN) 5 MG capsule Take 5 mg by mouth at bedtime. 03/28/18  Yes [provider]  traMADol (ULTRAM) 50 MG tablet Take 2 tablets (100 mg total) by mouth every 8 (eight) hours as needed for severe pain. 03/29/18  Yes Oval Linsey, MD  triamcinolone ointment (KENALOG) 0.1 % Apply 1 application topically 2 (two) times daily as needed (rash).   Yes [provider]    Family History Family History  Problem Relation Age of Onset  . Kidney failure  Mother   . Diabetes Mother   . Hypertension Mother   . Heart disease Mother   . Glaucoma Father   . Congestive Heart Failure Sister   . Diabetes Sister   . Kidney disease Sister   . Diabetes Brother   . Unexplained death Brother 90       Automobile accident  . Hypothyroidism Daughter   . Arthritis Daughter        Neck/Back  . Healthy Son   . HIV/AIDS Brother   . HIV Daughter   . Kidney disease Daughter   . Arthritis Son        Knee    Social History Social History   Tobacco Use  . Smoking status: Current Every Day Smoker    Packs/day: 0.50    Years: 50.00    Pack years: 25.00    Types: Cigarettes  . Smokeless tobacco: Never Used  . Tobacco comment: smoking .5 PPD  Substance Use Topics  . Alcohol use: No    Alcohol/week: 0.0 standard drinks    Comment: "last drink of alcohol ~ 1977"  . Drug use: No    Comment: cutting back on chantix, 1 cigarette this morning     Allergies   Hctz [hydrochlorothiazide]   Review of Systems Review of Systems  Constitutional: Negative for fever.  HENT: Negative for sore throat.   Eyes: Negative for visual disturbance.  Respiratory: Positive for cough. Negative for shortness of breath.   Cardiovascular: Negative for chest pain.  Gastrointestinal: Positive for abdominal pain. Negative for diarrhea, nausea and vomiting.  Genitourinary: Positive for frequency. Negative for difficulty urinating (sometimes harder than others but no problems recently).  Musculoskeletal: Negative for back pain and neck pain.  Skin: Negative for rash.  Neurological: Positive for headaches. Negative for syncope.  Psychiatric/Behavioral: Positive for confusion.     Physical Exam Updated Vital Signs BP 123/74 (BP Location: Left Arm)   Pulse 74   Temp 99.1 F (37.3 C) (Oral)   Resp 17   Ht 5\' 8"  (1.727 m)   Wt 56.7 kg   SpO2 99%   BMI 19.01 kg/m   Physical Exam Vitals signs and nursing note reviewed.  Constitutional:  General: She is  not in acute distress.    Appearance: She is well-developed. She is not diaphoretic.  HENT:     Head: Normocephalic and atraumatic.  Eyes:     Conjunctiva/sclera: Conjunctivae normal.  Neck:     Musculoskeletal: Normal range of motion.  Cardiovascular:     Rate and Rhythm: Normal rate and regular rhythm.     Heart sounds: Normal heart sounds. No murmur. No friction rub. No gallop.   Pulmonary:     Effort: Pulmonary effort is normal. No respiratory distress.     Breath sounds: Normal breath sounds. No wheezing or rales.  Abdominal:     General: There is no distension.     Palpations: Abdomen is soft.     Tenderness: There is no abdominal tenderness. There is no guarding.  Musculoskeletal:        General: No tenderness.  Skin:    General: Skin is warm and dry.     Findings: No erythema or rash.  Neurological:     Mental Status: She is alert and oriented to person, place, and time.      ED Treatments / Results  Labs (all labs ordered are listed, but only abnormal results are displayed) Labs Reviewed  CBC WITH DIFFERENTIAL/PLATELET - Abnormal; Notable for the following components:      Result Value   RBC 3.80 (*)    MCV 100.8 (*)    All other components within normal limits  COMPREHENSIVE METABOLIC PANEL - Abnormal; Notable for the following components:   Chloride 112 (*)    CO2 16 (*)    AST 99 (*)    All other components within normal limits  URINALYSIS, ROUTINE W REFLEX MICROSCOPIC - Abnormal; Notable for the following components:   APPearance HAZY (*)    Hgb urine dipstick MODERATE (*)    Ketones, ur 5 (*)    Protein, ur 30 (*)    Nitrite POSITIVE (*)    Bacteria, UA FEW (*)    All other components within normal limits  CULTURE, BLOOD (ROUTINE X 2)  CULTURE, BLOOD (ROUTINE X 2)  URINE CULTURE  LACTIC ACID, PLASMA  LACTIC ACID, PLASMA  AMMONIA  CBC  BASIC METABOLIC PANEL    EKG EKG Interpretation  Date/Time:  Monday April 08 2018 09:59:08  EST Ventricular Rate:  77 PR Interval:    QRS Duration: 99 QT Interval:  418 QTC Calculation: 474 R Axis:   41 Text Interpretation:  Sinus rhythm Borderline repolarization abnormality No significant change since last tracing Confirmed by Gareth Morgan [phone]) on 04/08/2018 12:38:12 PM   Radiology Dg Chest 2 View  Result Date: 04/08/2018 CLINICAL DATA:  Altered mental status ,,sob,smokerams EXAM: CHEST - 2 VIEW COMPARISON:  01/04/2018 FINDINGS: Normal mediastinum and cardiac silhouette. Normal pulmonary vasculature. No evidence of effusion, infiltrate, or pneumothorax. No acute bony abnormality. IMPRESSION: No acute cardiopulmonary process. Electronically Signed   By: Suzy Bouchard M.D.   On: 04/08/2018 11:17   Ct Head Wo Contrast  Result Date: 04/08/2018 CLINICAL DATA:  Encephalopathy. EXAM: CT HEAD WITHOUT CONTRAST TECHNIQUE: Contiguous axial images were obtained from the base of the skull through the vertex without intravenous contrast. COMPARISON:  11/12/2017 FINDINGS: Brain: No evidence of acute infarction, hemorrhage, hydrocephalus, extra-axial collection or mass lesion/mass effect. Vascular: No hyperdense vessel or unexpected calcification. Skull: Normal. Negative for fracture or focal lesion. Sinuses/Orbits: No acute finding. Other: None IMPRESSION: 1. No acute intracranial abnormalities. 2. Chronic small vessel ischemic change with brain  atrophy Electronically Signed   By: Kerby Moors M.D.   On: 04/08/2018 10:26    Procedures Procedures (including critical care time)  Medications Ordered in ED Medications  ciprofloxacin (CIPRO) IVPB 400 mg (0 mg Intravenous Stopped 04/08/18 1600)  aspirin EC tablet 81 mg (has no administration in time range)  traMADol (ULTRAM) tablet 25 mg (25 mg Oral Given 04/08/18 1736)  bictegravir-emtricitabine-tenofovir AF (BIKTARVY) 50-200-25 MG per tablet 1 tablet (has no administration in time range)  amLODipine (NORVASC) tablet 10 mg (has no  administration in time range)  atorvastatin (LIPITOR) tablet 10 mg (has no administration in time range)  benazepril (LOTENSIN) tablet 40 mg (has no administration in time range)  terazosin (HYTRIN) capsule 5 mg (has no administration in time range)  ALPRAZolam (XANAX) tablet 1 mg (has no administration in time range)  docusate sodium (COLACE) capsule 100 mg (100 mg Oral Given 04/08/18 2137)  pantoprazole (PROTONIX) EC tablet 40 mg (has no administration in time range)  calcium-vitamin D (OSCAL WITH D) 500-200 MG-UNIT per tablet 1 tablet (1 tablet Oral Given 04/08/18 2137)  multivitamin with minerals tablet 1 tablet (has no administration in time range)  diclofenac sodium (VOLTAREN) 1 % transdermal gel 2 g (has no administration in time range)  dorzolamide (TRUSOPT) 2 % ophthalmic solution 1 drop (1 drop Both Eyes Given 04/08/18 2144)  latanoprost (XALATAN) 0.005 % ophthalmic solution 1 drop (1 drop Both Eyes Given 04/08/18 2150)  enoxaparin (LOVENOX) injection 40 mg (40 mg Subcutaneous Given 04/08/18 1736)  0.9 %  sodium chloride infusion ( Intravenous New Bag/Given 04/08/18 1617)  acetaminophen (TYLENOL) tablet 650 mg (has no administration in time range)    Or  acetaminophen (TYLENOL) suppository 650 mg (has no administration in time range)  brimonidine (ALPHAGAN) 0.2 % ophthalmic solution 1 drop (1 drop Left Eye Given 04/08/18 2140)    And  timolol (TIMOPTIC) 0.5 % ophthalmic solution 1 drop (1 drop Left Eye Given 04/08/18 2136)     Initial Impression / Assessment and Plan / ED Course  I have reviewed the triage vital signs and the nursing notes.  Pertinent labs & imaging results that were available during my care of the patient were reviewed by me and considered in my medical decision making (see chart for details).     78 year old female with history of HIV well controlled, hyperlipidemia, anxiety, chronic pain, who presents with concern for altered mental status.  Presentation consistent  with waxing and waning delirium.  Labs obtained show no significant abnormalities.  Urinalysis shows signs of urinary tract infection.   CT head is negative.  No signs of CVA on exam. Polypharmacy possible contributor. Suspect infection likely contributing to delirium.  Patient given Rocephin.  Will admit for further care.  Final Clinical Impressions(s) / ED Diagnoses   Final diagnoses:  Acute delirium  Urinary tract infection without hematuria, site unspecified    ED Discharge Orders    None       Gareth Morgan, MD 04/08/18 2243

## 2018-04-09 DIAGNOSIS — N390 Urinary tract infection, site not specified: Secondary | ICD-10-CM | POA: Diagnosis not present

## 2018-04-09 LAB — CBC
HCT: 34.4 % — ABNORMAL LOW (ref 36.0–46.0)
Hemoglobin: 11.2 g/dL — ABNORMAL LOW (ref 12.0–15.0)
MCH: 32 pg (ref 26.0–34.0)
MCHC: 32.6 g/dL (ref 30.0–36.0)
MCV: 98.3 fL (ref 80.0–100.0)
Platelets: 301 10*3/uL (ref 150–400)
RBC: 3.5 MIL/uL — ABNORMAL LOW (ref 3.87–5.11)
RDW: 12.1 % (ref 11.5–15.5)
WBC: 6.6 10*3/uL (ref 4.0–10.5)
nRBC: 0 % (ref 0.0–0.2)

## 2018-04-09 LAB — BASIC METABOLIC PANEL
Anion gap: 10 (ref 5–15)
BUN: 12 mg/dL (ref 8–23)
CALCIUM: 9 mg/dL (ref 8.9–10.3)
CO2: 21 mmol/L — ABNORMAL LOW (ref 22–32)
Chloride: 110 mmol/L (ref 98–111)
Creatinine, Ser: 0.64 mg/dL (ref 0.44–1.00)
GFR calc Af Amer: >60 mL/min (ref >60)
Glucose, Bld: 81 mg/dL (ref 70–99)
Potassium: 3 mmol/L — ABNORMAL LOW (ref 3.5–5.1)
Sodium: 141 mmol/L (ref 135–145)

## 2018-04-09 LAB — URINE CULTURE: Culture: >=100000 — AB

## 2018-04-09 MED ORDER — POTASSIUM CHLORIDE CRYS ER 20 MEQ PO TBCR
20.0000 meq | EXTENDED_RELEASE_TABLET | Freq: Once | ORAL | Status: AC
  Administered 2018-04-09: 20 meq via ORAL
  Filled 2018-04-09: qty 1

## 2018-04-09 MED ORDER — ALPRAZOLAM 0.5 MG PO TABS: 0.5000 mg | ORAL_TABLET | Freq: Every evening | ORAL | Status: DC | PRN

## 2018-04-09 MED ORDER — POTASSIUM CHLORIDE CRYS ER 20 MEQ PO TBCR
40.0000 meq | EXTENDED_RELEASE_TABLET | Freq: Once | ORAL | Status: AC
  Administered 2018-04-09: 40 meq via ORAL
  Filled 2018-04-09 (×2): qty 2

## 2018-04-09 MED ORDER — CIPROFLOXACIN HCL 500 MG PO TABS: 500.0000 mg | ORAL_TABLET | Freq: Two times a day (BID) | ORAL | 0 refills | Status: DC

## 2018-04-09 NOTE — Progress Notes (Signed)
Nsg Discharge Note  Admit Date:  04/08/2018 Discharge date: 04/09/2018   Jessica [name] to be D/C'd Home per MD order.  AVS completed.  Copy for chart, and copy for patient signed, and dated. Patient/caregiver able to verbalize understanding.  Discharge Medication: Allergies as of 04/09/2018      Reactions   Hctz [hydrochlorothiazide] Other (See Comments)   Dizziness, syncope; does NOT wish to take anymore      Medication List    TAKE these medications   alendronate 70 MG tablet Commonly known as:  FOSAMAX Take 1 tablet (70 mg total) by mouth once a week. What changed:  when to take this   ALPRAZolam 0.5 MG tablet Commonly known as:  XANAX Take 1 tablet (0.5 mg total) by mouth at bedtime as needed for anxiety. What changed:    medication strength  how much to take   amLODipine 10 MG tablet Commonly known as:  NORVASC Take 1 tablet (10 mg total) by mouth daily.   aspirin 81 MG EC tablet Take 81 mg by mouth daily.   atorvastatin 10 MG tablet Commonly known as:  LIPITOR TAKE 1 TABLET BY MOUTH EVERY DAY (STOP LOVASTATIN) What changed:  See the new instructions.   benazepril 40 MG tablet Commonly known as:  LOTENSIN TAKE ONE TABLET BY MOUTH DAILY   bictegravir-emtricitabine-tenofovir AF 50-200-25 MG Tabs tablet Commonly known as:  BIKTARVY Take 1 tablet by mouth daily.   calcium-vitamin D 500-200 MG-UNIT tablet Commonly known as:  OSCAL WITH D Take 1 tablet by mouth 2 (two) times daily.   ciprofloxacin 500 MG tablet Commonly known as:  CIPRO Take 1 tablet (500 mg total) by mouth 2 (two) times daily.   COMBIGAN 0.2-0.5 % ophthalmic solution Generic drug:  brimonidine-timolol Place 1 drop into the left eye 2 (two) times daily.   diclofenac sodium 1 % Gel Commonly known as:  VOLTAREN Apply 4 g topically 4 (four) times daily as needed. APPLY 2 GRAMS TO AFFECTED AREAS FOUR TIMES DAILY AS NEEDED FOR PAIN What changed:    how much to take  reasons to  take this  additional instructions   docusate sodium 100 MG capsule Commonly known as:  COLACE Take 100 mg by mouth 2 (two) times daily.   dorzolamide 2 % ophthalmic solution Commonly known as:  TRUSOPT Place 1 drop into both eyes 2 (two) times daily.   latanoprost 0.005 % ophthalmic solution Commonly known as:  XALATAN Place 1 drop into both eyes at bedtime.   multivitamin with minerals Tabs tablet Take 1 tablet by mouth daily.   pantoprazole 40 MG tablet Commonly known as:  PROTONIX TAKE 1 TABLET BY MOUTH EVERY DAY   terazosin 5 MG capsule Commonly known as:  HYTRIN Take 5 mg by mouth at bedtime.   traMADol 50 MG tablet Commonly known as:  ULTRAM Take 2 tablets (100 mg total) by mouth every 8 (eight) hours as needed for severe pain.   triamcinolone ointment 0.1 % Commonly known as:  KENALOG Apply 1 application topically 2 (two) times daily as needed (rash).       Discharge Assessment: Vitals:   04/08/18 2240 04/09/18 0543  BP: 123/74 131/67  Pulse: 74 65  Resp: 17 16  Temp: 99.1 F (37.3 C) 97.8 F (36.6 C)  SpO2: 99% 99%   Skin clean, dry and intact without evidence of skin break down, no evidence of skin tears noted. IV catheter discontinued intact. Site without signs and symptoms  of complications - no redness or edema noted at insertion site, patient denies c/o pain - only slight tenderness at site.  Dressing with slight pressure applied.  D/c Instructions-Education: Discharge instructions given to patient/family with verbalized understanding. D/c education completed with patient/family including follow up instructions, medication list, d/c activities limitations if indicated, with other d/c instructions as indicated by MD - patient able to verbalize understanding, all questions fully answered. Patient instructed to return to ED, call 911, or call MD for any changes in condition.  Patient escorted via [place], and D/C home via private auto.  Erasmo Leventhal,  RN 04/09/2018 10:23 AM

## 2018-04-09 NOTE — Discharge Summary (Signed)
Jessica [name] XIH:[id number] DOB: [birth year] DOA: 04/08/2018  PCP: Oval Linsey, MD  Admit date: 04/08/2018  Discharge date: 04/09/2018  Admitted From: Home   Disposition:  Home   Recommendations for Outpatient Follow-up:   Follow up with PCP in 1-2 weeks  PCP Please obtain BMP/CBC, 2 view CXR in 1week,  (see Discharge instructions)   PCP Please follow up on the following pending results: follow final Urine Culture results   Home Health: None Equipment/Devices: None  Consultations: None Discharge Condition: Stable   CODE STATUS: Full   Diet Recommendation: Heart Healthy     Chief Complaint  Patient presents with  . Back Pain     Brief history of present illness from the day of admission and additional interim summary    Jessica [name] is a 78 y.o. female with medical history significant of recurrent UTI, history of urinary retention, HIV, chronic pain issues, benzodiazepine use, peripheral vascular disease and hypertension who presents to the emergency room with her daughter when she found her to be mildly confused at home.                                                                 Hospital Course    Mild questionable encephalopathy.  Patient was still awake alert and oriented, she thought there was something wrong with her, mild confusion noted by daughter which is completely resolved.  Likely due to mild UTI along with Xanax use at home: She was kept on gentle IV fluids and IV Cipro, next morning she is back to her baseline and completely symptom-free, she has responded well to Cipro hence will get 2 more days of oral Cipro, home dose Xanax has been cut in half.  Will be discharged home.  She refused home PT.  She is back to baseline.   HIV disease: Well controlled.  She is on  Biktarvy that she will continue.  Hypertension: Blood pressures are stable.  Resume home medications.  Smoking: Patient continues to smoke.  She was counseled to quit.   Discharge diagnosis     Principal Problem:   Acute lower UTI Active Problems:   Human immunodeficiency virus disease ([place])   Essential hypertension   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[place])   Tobacco abuse   Generalized anxiety disorder   Acute metabolic encephalopathy   UTI (urinary tract infection)    Discharge instructions    Discharge Instructions    Diet - low sodium heart healthy   Complete by:  As directed    Discharge instructions   Complete by:  As directed    Follow with Primary MD Oval Linsey, MD in 7 days   Get CBC, CMP, checked  by Primary MD  in 5-7 days   Activity: As tolerated with Full  fall precautions use walker/cane & assistance as needed  Disposition Home    Diet: Heart Healthy   Special Instructions: If you have smoked or chewed Tobacco  in the last 2 yrs please stop smoking, stop any regular Alcohol  and or any Recreational drug use.  On your next visit with your primary care physician please Get Medicines reviewed and adjusted.  Please request your Prim.MD to go over all Hospital Tests and Procedure/Radiological results at the follow up, please get all Hospital records sent to your Prim MD by signing hospital release before you go home.  If you experience worsening of your admission symptoms, develop shortness of breath, life threatening emergency, suicidal or homicidal thoughts you must seek medical attention immediately by calling 911 or calling your MD immediately  if symptoms less severe.  You Must read complete instructions/literature along with all the possible adverse reactions/side effects for all the Medicines you take and that have been prescribed to you. Take any new Medicines after you have completely understood and accpet all the possible adverse  reactions/side effects.   Increase activity slowly   Complete by:  As directed       Discharge Medications   Allergies as of 04/09/2018      Reactions   Hctz [hydrochlorothiazide] Other (See Comments)   Dizziness, syncope; does NOT wish to take anymore      Medication List    TAKE these medications   alendronate 70 MG tablet Commonly known as:  FOSAMAX Take 1 tablet (70 mg total) by mouth once a week. What changed:  when to take this   ALPRAZolam 0.5 MG tablet Commonly known as:  XANAX Take 1 tablet (0.5 mg total) by mouth at bedtime as needed for anxiety. What changed:    medication strength  how much to take   amLODipine 10 MG tablet Commonly known as:  NORVASC Take 1 tablet (10 mg total) by mouth daily.   aspirin 81 MG EC tablet Take 81 mg by mouth daily.   atorvastatin 10 MG tablet Commonly known as:  LIPITOR TAKE 1 TABLET BY MOUTH EVERY DAY (STOP LOVASTATIN) What changed:  See the new instructions.   benazepril 40 MG tablet Commonly known as:  LOTENSIN TAKE ONE TABLET BY MOUTH DAILY   bictegravir-emtricitabine-tenofovir AF 50-200-25 MG Tabs tablet Commonly known as:  BIKTARVY Take 1 tablet by mouth daily.   calcium-vitamin D 500-200 MG-UNIT tablet Commonly known as:  OSCAL WITH D Take 1 tablet by mouth 2 (two) times daily.   ciprofloxacin 500 MG tablet Commonly known as:  CIPRO Take 1 tablet (500 mg total) by mouth 2 (two) times daily.   COMBIGAN 0.2-0.5 % ophthalmic solution Generic drug:  brimonidine-timolol Place 1 drop into the left eye 2 (two) times daily.   diclofenac sodium 1 % Gel Commonly known as:  VOLTAREN Apply 4 g topically 4 (four) times daily as needed. APPLY 2 GRAMS TO AFFECTED AREAS FOUR TIMES DAILY AS NEEDED FOR PAIN What changed:    how much to take  reasons to take this  additional instructions   docusate sodium 100 MG capsule Commonly known as:  COLACE Take 100 mg by mouth 2 (two) times daily.   dorzolamide 2 %  ophthalmic solution Commonly known as:  TRUSOPT Place 1 drop into both eyes 2 (two) times daily.   latanoprost 0.005 % ophthalmic solution Commonly known as:  XALATAN Place 1 drop into both eyes at bedtime.   multivitamin with minerals Tabs tablet Take  1 tablet by mouth daily.   pantoprazole 40 MG tablet Commonly known as:  PROTONIX TAKE 1 TABLET BY MOUTH EVERY DAY   terazosin 5 MG capsule Commonly known as:  HYTRIN Take 5 mg by mouth at bedtime.   traMADol 50 MG tablet Commonly known as:  ULTRAM Take 2 tablets (100 mg total) by mouth every 8 (eight) hours as needed for severe pain.   triamcinolone ointment 0.1 % Commonly known as:  KENALOG Apply 1 application topically 2 (two) times daily as needed (rash).       Follow-up Information    Oval Linsey, MD. Schedule an appointment as soon as possible for a visit in 1 week(s).   Specialty:  Internal Medicine Contact information: [address] [place] [address] [place], Lavell Islam, MD .   Specialty:  Infectious Diseases Contact information: [place]. [place] [address] [phone]           Major procedures and Radiology Reports - PLEASE review detailed and final reports thoroughly  -        Dg Chest 2 View  Result Date: 04/08/2018 CLINICAL DATA:  Altered mental status ,,sob,smokerams EXAM: CHEST - 2 VIEW COMPARISON:  01/04/2018 FINDINGS: Normal mediastinum and cardiac silhouette. Normal pulmonary vasculature. No evidence of effusion, infiltrate, or pneumothorax. No acute bony abnormality. IMPRESSION: No acute cardiopulmonary process. Electronically Signed   By: Suzy Bouchard M.D.   On: 04/08/2018 11:17   Ct Head Wo Contrast  Result Date: 04/08/2018 CLINICAL DATA:  Encephalopathy. EXAM: CT HEAD WITHOUT CONTRAST TECHNIQUE: Contiguous axial images were obtained from the base of the skull through the vertex without intravenous contrast. COMPARISON:  11/12/2017  FINDINGS: Brain: No evidence of acute infarction, hemorrhage, hydrocephalus, extra-axial collection or mass lesion/mass effect. Vascular: No hyperdense vessel or unexpected calcification. Skull: Normal. Negative for fracture or focal lesion. Sinuses/Orbits: No acute finding. Other: None IMPRESSION: 1. No acute intracranial abnormalities. 2. 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   On: 04/08/2018 10:26    Micro Results     No results found for this or any previous visit (from the past 240 hour(s)).  Today   Subjective    Jessica [name] today has no headache,no chest abdominal pain,no new weakness tingling or numbness, feels much better wants to go home today.     Objective   Blood pressure 131/67, pulse 65, temperature 97.8 F (36.6 C), resp. rate 16, height 5\' 8"  (1.727 m), weight 56.7 kg, SpO2 99 %.   Intake/Output Summary (Last 24 hours) at 04/09/2018 0903 Last data filed at 04/09/2018 5956 Gross per 24 hour  Intake 203.33 ml  Output -  Net 203.33 ml    Exam  Awake Alert, Oriented x 3, No new F.N deficits, Normal affect [place].AT,PERRAL Supple Neck,No JVD, No cervical lymphadenopathy appriciated.  Symmetrical Chest wall movement, Good air movement bilaterally, CTAB RRR,No Gallops,Rubs or new Murmurs, No Parasternal Heave +ve B.Sounds, Abd Soft, Non tender, No organomegaly appriciated, No rebound -guarding or rigidity. No Cyanosis, Clubbing or edema, No new Rash or bruise   Data Review   CBC w Diff:  Lab Results  Component Value Date   WBC 6.6 04/09/2018   HGB 11.2 (L) 04/09/2018   HGB 13.0 03/16/2017   HCT 34.4 (L) 04/09/2018   HCT 29.6 (L) 01/04/2018   PLT 301 04/09/2018   PLT 291 03/16/2017  LYMPHOPCT 36 04/08/2018   MONOPCT 6 04/08/2018   EOSPCT 1 04/08/2018   BASOPCT 0 04/08/2018    CMP:  Lab Results  Component Value Date   NA 141 04/09/2018   NA 142 10/19/2017   K 3.0 (L)  04/09/2018   CL 110 04/09/2018   CO2 21 (L) 04/09/2018   BUN 12 04/09/2018   BUN 14 10/19/2017   CREATININE 0.64 04/09/2018   CREATININE 0.65 03/20/2018   PROT 7.4 04/08/2018   PROT 7.6 03/16/2017   ALBUMIN 3.8 04/08/2018   ALBUMIN 4.3 03/16/2017   BILITOT 1.1 04/08/2018   BILITOT 0.3 03/16/2017   ALKPHOS 52 04/08/2018   AST 99 (H) 04/08/2018   ALT 30 04/08/2018  .   Total Time in preparing paper work, data evaluation and todays exam - 44 minutes  Lala Lund M.D on 04/09/2018 at 9:03 AM  [facility]   Office  [phone]

## 2018-04-09 NOTE — Discharge Instructions (Signed)
Follow with Primary MD Oval Linsey, MD in 7 days   Get CBC, CMP, checked  by Primary MD  in 5-7 days   Activity: As tolerated with Full fall precautions use walker/cane & assistance as needed  Disposition Home    Diet: Heart Healthy   Special Instructions: If you have smoked or chewed Tobacco  in the last 2 yrs please stop smoking, stop any regular Alcohol  and or any Recreational drug use.  On your next visit with your primary care physician please Get Medicines reviewed and adjusted.  Please request your Prim.MD to go over all Hospital Tests and Procedure/Radiological results at the follow up, please get all Hospital records sent to your Prim MD by signing hospital release before you go home.  If you experience worsening of your admission symptoms, develop shortness of breath, life threatening emergency, suicidal or homicidal thoughts you must seek medical attention immediately by calling 911 or calling your MD immediately  if symptoms less severe.  You Must read complete instructions/literature along with all the possible adverse reactions/side effects for all the Medicines you take and that have been prescribed to you. Take any new Medicines after you have completely understood and accpet all the possible adverse reactions/side effects.

## 2018-04-09 NOTE — Progress Notes (Signed)
OT Cancellation Note  Patient Details Name: Jessica [name] MRN: [id number] DOB: 1941-01-01   Cancelled Treatment:    Reason Eval/Treat Not Completed: OT screened, no needs identified, will sign off  Britt Bottom 04/09/2018, 9:43 AM

## 2018-04-12 ENCOUNTER — Ambulatory Visit: Payer: Medicare HMO

## 2018-04-12 ENCOUNTER — Encounter: Payer: Self-pay | Admitting: Infectious Disease

## 2018-04-12 ENCOUNTER — Ambulatory Visit (INDEPENDENT_AMBULATORY_CARE_PROVIDER_SITE_OTHER): Payer: Medicare HMO | Admitting: Infectious Disease

## 2018-04-12 VITALS — BP 170/81 | HR 70 | Temp 98.3°F | Wt 125.0 lb

## 2018-04-12 DIAGNOSIS — G9341 Metabolic encephalopathy: Secondary | ICD-10-CM

## 2018-04-12 DIAGNOSIS — E876 Hypokalemia: Secondary | ICD-10-CM | POA: Diagnosis not present

## 2018-04-12 DIAGNOSIS — F1120 Opioid dependence, uncomplicated: Secondary | ICD-10-CM

## 2018-04-12 DIAGNOSIS — R69 Illness, unspecified: Secondary | ICD-10-CM | POA: Diagnosis not present

## 2018-04-12 DIAGNOSIS — F11229 Opioid dependence with intoxication, unspecified: Secondary | ICD-10-CM

## 2018-04-12 DIAGNOSIS — B20 Human immunodeficiency virus [HIV] disease: Secondary | ICD-10-CM

## 2018-04-12 HISTORY — DX: Hypokalemia: E87.6

## 2018-04-12 HISTORY — DX: Opioid dependence, uncomplicated: F11.20

## 2018-04-12 MED ORDER — BICTEGRAVIR-EMTRICITAB-TENOFOV 50-200-25 MG PO TABS: 1.0000 | ORAL_TABLET | Freq: Every day | ORAL | 11 refills | Status: DC

## 2018-04-12 NOTE — Progress Notes (Signed)
Chief complaint: Follow-up for HIV on medication, concerned about how she is going to the hospital follow-up with Dr. Eppie Gibson in 1 week, after she was admitted hospital confusion  Subjective:    Patient ID: Jessica [name], female    DOB: 09-Jan-1941, 78 y.o.   MRN: [id number]  HPI  Jessica [name] is a 78 y.o. female with HIV infection who has done superbly well on her antiviral regimen, changed from Atripla to [facility] and DESCOVY -->BIKTARVY with reasonable virological suppression.  She was YET AGAIN admitted to  [facility] with a toxic metabolic encephalopathy/ likely overdose of opiates.  She was admitted to hospital several days ago and cleared within 24 hours.  Urinary tract infection was thought to also be playing a role although she did not have urinary symptoms and her urine culture only grew diphtheroids.  She finished 3 days of ciprofloxacin.  She feels much better.  She was also noted to be hypokalemic while in the hospital I do not see a repeat chemistry showing improvement of her potassium that is gotten down to 3.     Past Medical History:  Diagnosis Date  . Acute renal failure (ARF) ([place]) 08/17/2017  . Anxiety 04/05/2012  . Bilateral sensorineural hearing loss 06/11/2014   Mild to moderate on the left side and slight to mild on the right side per audiometry 05/2014.  Hearing aides with possible masking of tinnitus recommended but patient wished to defer secondary to finances.  . Blood transfusion without reported diagnosis    pt denies  . Bursitis of right shoulder 07/12/2012   s/p shoulder injection 07/12/2012   . Cataract of right eye   . Constipation due to pain medication 04/27/2010  . Diverticulosis 02/08/2012   Extensive left-sided diverticula on colonoscopy March 2012 per Dr. Gala Romney   . Essential hypertension 07/20/2006  . Genital herpes 07/20/2006  . 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 Heart murmur 1961  . Human immunodeficiency virus disease ([place]) 03/27/1986  .  Hyperlipidemia LDL goal < 100 04/05/2012  . Hypokalemia 04/12/2018  . Long-term current use of opiate analgesic 03/17/2016  . Lumbar degenerative disc disease 07/20/2006   With chronic back pain   . Marijuana use 07/03/2016  . Micturition syncope 09/20/2015  . 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[place]) 11/01/2011   s/p aortobifem bypass 2009   . Periumbilical hernia 1/0/9323   1 cm left periumbilical abdominal wall defect  . 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 Right rotator cuff tear 02/01/2013   Responds to periodic steroid injections  . Seborrhea 09/01/2010  . Shoulder pain, right 12/04/2017  . Small bowel obstruction due to adhesions ([place]) 02/08/2012   s/p Exploratory laparotomy, lysis of adhesions 02/12/12    . 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 Tobacco abuse 02/19/2012  . Tobacco abuse   . Vasovagal syncope 02/15/2015  . Voiding dysfunction    s/p cystoscopy and meatal dilation Dec 2005    Past Surgical History:  Procedure Laterality Date  . ABDOMINAL HYSTERECTOMY    . AORTO-FEMORAL BYPASS GRAFT  04/2007  . APPENDECTOMY    . BREAST SURGERY     Breast biopsy: negative  . CHOLECYSTECTOMY    . COLECTOMY  01/2011   Dr. Margot Chimes; "took out 12 inches of small intestiines and removed blockage"  . EYE SURGERY    . LAPAROTOMY  02/12/2012   Procedure: EXPLORATORY LAPAROTOMY;  Surgeon: Dorris Fetch  Barry Dienes, MD;  Location: [place];  Service: General;  Laterality: N/A;  Exploratory Laparotomy, lysis of adhesions  . SMALL INTESTINE SURGERY      Family History  Problem Relation Age of Onset  . Kidney failure Mother   . Diabetes Mother   . Hypertension Mother   . Heart disease Mother   . Glaucoma Father   . Congestive Heart Failure Sister   . Diabetes Sister   . Kidney disease Sister   . Diabetes Brother   . Unexplained death Brother 41       Automobile accident  . Hypothyroidism Daughter   . Arthritis Daughter         Neck/Back  . Healthy Son   . HIV/AIDS Brother   . HIV Daughter   . Kidney disease Daughter   . Arthritis Son        Knee      Social History   Socioeconomic History  . Marital status: Widowed    Spouse name: Not on file  . Number of children: 4  . Years of education: 2y college  . Highest education level: Not on file  Occupational History  . Occupation: retired    Comment: previously worked as a Designer, fashion/clothing for [employer]  . Financial resource strain: Not on file  . Food insecurity:    Worry: Not on file    Inability: Not on file  . Transportation needs:    Medical: Not on file    Non-medical: Not on file  Tobacco Use  . Smoking status: Current Every Day Smoker    Packs/day: 0.50    Years: 50.00    Pack years: 25.00    Types: Cigarettes  . Smokeless tobacco: Never Used  . Tobacco comment: smoking .5 PPD  Substance and Sexual Activity  . Alcohol use: No    Alcohol/week: 0.0 standard drinks    Comment: "last drink of alcohol ~ 1977"  . Drug use: No    Comment: cutting back on chantix, 1 cigarette this morning  . Sexual activity: Never    Partners: Male  Lifestyle  . Physical activity:    Days per week: Not on file    Minutes per session: Not on file  . Stress: Not on file  Relationships  . Social connections:    Talks on phone: Not on file    Gets together: Not on file    Attends religious service: Not on file    Active member of club or organization: Not on file    Attends meetings of clubs or organizations: Not on file    Relationship status: Not on file  Other Topics Concern  . Not on file  Social History Narrative   Lives alone in [place], [place]    Allergies  Allergen Reactions  . Hctz [Hydrochlorothiazide] Other (See Comments)    Dizziness, syncope; does NOT wish to take anymore     Current Outpatient Medications:  .  alendronate (FOSAMAX) 70 MG tablet, Take 1 tablet (70 mg total) by mouth once a week. (Patient taking differently: Take  70 mg by mouth every Sunday. ), Disp: 12 tablet, Rfl: 3 .  ALPRAZolam (XANAX) 0.5 MG tablet, Take 1 tablet (0.5 mg total) by mouth at bedtime as needed for anxiety., Disp: , Rfl:  .  amLODipine (NORVASC) 10 MG tablet, Take 1 tablet (10 mg total) by mouth daily., Disp: 90 tablet, Rfl: 3 .  aspirin 81 MG EC tablet, Take 81 mg  by mouth daily.  , Disp: , Rfl:  .  atorvastatin (LIPITOR) 10 MG tablet, TAKE 1 TABLET BY MOUTH EVERY DAY (STOP LOVASTATIN) (Patient taking differently: Take 10 mg by mouth daily. ), Disp: 30 tablet, Rfl: 0 .  benazepril (LOTENSIN) 40 MG tablet, TAKE ONE TABLET BY MOUTH DAILY (Patient taking differently: Take 40 mg by mouth daily. ), Disp: 90 tablet, Rfl: 3 .  bictegravir-emtricitabine-tenofovir AF (BIKTARVY) 50-200-25 MG TABS tablet, Take 1 tablet by mouth daily., Disp: 30 tablet, Rfl: 11 .  calcium-vitamin D (OSCAL WITH D) 500-200 MG-UNIT per tablet, Take 1 tablet by mouth 2 (two) times daily., Disp: , Rfl:  .  COMBIGAN 0.2-0.5 % ophthalmic solution, Place 1 drop into the left eye 2 (two) times daily. , Disp: , Rfl:  .  diclofenac sodium (VOLTAREN) 1 % GEL, Apply 4 g topically 4 (four) times daily as needed. APPLY 2 GRAMS TO AFFECTED AREAS FOUR TIMES DAILY AS NEEDED FOR PAIN (Patient taking differently: Apply 2 g topically 4 (four) times daily as needed (pain). ), Disp: 300 g, Rfl: 11 .  docusate sodium (COLACE) 100 MG capsule, Take 100 mg by mouth 2 (two) times daily., Disp: , Rfl:  .  dorzolamide (TRUSOPT) 2 % ophthalmic solution, Place 1 drop into both eyes 2 (two) times daily., Disp: , Rfl: 5 .  latanoprost (XALATAN) 0.005 % ophthalmic solution, Place 1 drop into both eyes at bedtime., Disp: , Rfl:  .  Multiple Vitamin (MULTIVITAMIN WITH MINERALS) TABS, Take 1 tablet by mouth daily., Disp: , Rfl:  .  pantoprazole (PROTONIX) 40 MG tablet, TAKE 1 TABLET BY MOUTH EVERY DAY (Patient taking differently: Take 40 mg by mouth daily. ), Disp: 30 tablet, Rfl: 0 .  terazosin (HYTRIN) 5  MG capsule, Take 5 mg by mouth at bedtime., Disp: , Rfl:  .  traMADol (ULTRAM) 50 MG tablet, Take 2 tablets (100 mg total) by mouth every 8 (eight) hours as needed for severe pain., Disp: 180 tablet, Rfl: 5 .  triamcinolone ointment (KENALOG) 0.1 %, Apply 1 application topically 2 (two) times daily as needed (rash)., Disp: , Rfl:         Review of Systems  Constitutional: Negative for activity change, appetite change, chills, diaphoresis, fatigue and unexpected weight change.  HENT: Negative for congestion, ear pain, rhinorrhea, sinus pressure, sneezing, sore throat and trouble swallowing.   Eyes: Negative for photophobia and visual disturbance.  Respiratory: Negative for cough, chest tightness, shortness of breath, wheezing and stridor.   Cardiovascular: Negative for palpitations.  Gastrointestinal: Negative for abdominal distention, anal bleeding, blood in stool, constipation, diarrhea, nausea and vomiting.  Genitourinary: Negative for difficulty urinating, flank pain and hematuria.  Musculoskeletal: Positive for arthralgias and myalgias. Negative for gait problem and joint swelling.  Skin: Negative for color change, pallor and wound.  Neurological: Negative for dizziness, tremors and light-headedness.  Hematological: Negative for adenopathy. Does not bruise/bleed easily.  Psychiatric/Behavioral: Negative for agitation, behavioral problems, confusion, decreased concentration, dysphoric mood and sleep disturbance.        Objective:   Physical Exam  Constitutional: She is oriented to person, place, and time. She appears well-developed and well-nourished. No distress.  HENT:  Head: Normocephalic and atraumatic.  Mouth/Throat: No oropharyngeal exudate.  Eyes: Conjunctivae and EOM are normal. No scleral icterus.  Neck: Normal range of motion. Neck supple.  Cardiovascular: Normal rate and regular rhythm.  Pulmonary/Chest: Effort normal. No respiratory distress. She has no wheezes.   Abdominal: She exhibits no distension.  Musculoskeletal:        General: No edema.     Right shoulder: She exhibits decreased range of motion and tenderness.  Neurological: She is alert and oriented to person, place, and time. She exhibits normal muscle tone. Coordination normal.  Skin: Skin is warm and dry. No rash noted. She is not diaphoretic. No erythema. No pallor.  Psychiatric: She has a normal mood and affect. Her behavior is normal. Judgment and thought content normal.  Nursing note and vitals reviewed.         Assessment & Plan:     HIV: Continue BIKTARVY  Her SPAP, she can return to clinic in July  AVOID TAKING CALCIUm, mag, MVI at same time as HIV meds UNLESS she takes a full meal  Admission with encephalopathy: This seems to be due to her opiates and this is been a recurring problem.  Hypokalemia I have written to have her have a repeat BMP next week as he tries to increase her potassium intake  HTN being managed by PCP  Hyperlipidemia: Continue Lipitor

## 2018-04-13 LAB — CULTURE, BLOOD (ROUTINE X 2)
Culture: NO GROWTH
Culture: NO GROWTH
Special Requests: ADEQUATE
Special Requests: ADEQUATE

## 2018-04-15 ENCOUNTER — Telehealth: Payer: Self-pay | Admitting: Behavioral Health

## 2018-04-15 NOTE — Telephone Encounter (Signed)
Patient called stating she saw Dr. Tommy Medal last week and he was supposed to be started on potassium.  Informed patient Dr. Tommy Medal would be notified to clarify this information. Pricilla Riffle RN

## 2018-04-16 ENCOUNTER — Other Ambulatory Visit: Payer: Self-pay | Admitting: Behavioral Health

## 2018-04-16 DIAGNOSIS — E876 Hypokalemia: Secondary | ICD-10-CM

## 2018-04-16 NOTE — Telephone Encounter (Signed)
I wanted her to have BMP rechecked. I would like PCP to address K dosing

## 2018-04-16 NOTE — Telephone Encounter (Signed)
Called Jessica [name], informed her that doctor Tommy Medal would like for her to follow up with her PCP for Potassium dosing.  Scheduled her an appointment to come back in the office for repeat blood work.  Patient verified understanding and states she will call her PCP. Jessica Riffle RN

## 2018-04-17 ENCOUNTER — Other Ambulatory Visit: Payer: Medicare HMO

## 2018-04-17 ENCOUNTER — Other Ambulatory Visit: Payer: Self-pay | Admitting: Infectious Disease

## 2018-04-17 DIAGNOSIS — R69 Illness, unspecified: Secondary | ICD-10-CM | POA: Diagnosis not present

## 2018-04-17 DIAGNOSIS — E876 Hypokalemia: Secondary | ICD-10-CM

## 2018-04-17 DIAGNOSIS — B20 Human immunodeficiency virus [HIV] disease: Secondary | ICD-10-CM

## 2018-04-17 DIAGNOSIS — K219 Gastro-esophageal reflux disease without esophagitis: Secondary | ICD-10-CM

## 2018-04-17 DIAGNOSIS — E785 Hyperlipidemia, unspecified: Secondary | ICD-10-CM

## 2018-04-17 LAB — BASIC METABOLIC PANEL
BUN: 11 mg/dL (ref 7–25)
CO2: 21 mmol/L (ref 20–32)
Calcium: 9.1 mg/dL (ref 8.6–10.4)
Chloride: 113 mmol/L — ABNORMAL HIGH (ref 98–110)
Creat: 0.61 mg/dL (ref 0.60–0.93)
GLUCOSE: 82 mg/dL (ref 65–99)
Sodium: 141 mmol/L (ref 135–146)

## 2018-04-22 ENCOUNTER — Other Ambulatory Visit: Payer: Medicare HMO

## 2018-04-22 DIAGNOSIS — E876 Hypokalemia: Secondary | ICD-10-CM | POA: Diagnosis not present

## 2018-04-22 LAB — LIPID PANEL
Cholesterol: 147 mg/dL (ref <200)
HDL: 64 mg/dL (ref >=50)
LDL Cholesterol (Calc): 66 mg/dL (calc)
NON-HDL CHOLESTEROL (CALC): 83 mg/dL (ref <130)
Total CHOL/HDL Ratio: 2.3 (calc) (ref <5.0)
Triglycerides: 85 mg/dL (ref <150)

## 2018-04-22 LAB — COMPLETE METABOLIC PANEL WITH GFR
AG Ratio: 1.3 (calc) (ref 1.0–2.5)
ALT: 8 U/L (ref 6–29)
Albumin: 4.1 g/dL (ref 3.6–5.1)
Alkaline phosphatase (APISO): 54 U/L (ref 37–153)
BUN: 11 mg/dL (ref 7–25)
CO2: 16 mmol/L — ABNORMAL LOW (ref 20–32)
CREATININE: 0.63 mg/dL (ref 0.60–0.93)
Calcium: 9.5 mg/dL (ref 8.6–10.4)
Chloride: 113 mmol/L — ABNORMAL HIGH (ref 98–110)
GFR, Est African American: 102 mL/min/{1.73_m2} (ref >=60)
GFR, Est Non African American: 88 mL/min/{1.73_m2} (ref >=60)
Globulin: 3.1 g/dL (calc) (ref 1.9–3.7)
Glucose, Bld: 84 mg/dL (ref 65–99)
SODIUM: 143 mmol/L (ref 135–146)
Total Protein: 7.2 g/dL (ref 6.1–8.1)

## 2018-04-22 LAB — CBC WITH DIFFERENTIAL/PLATELET

## 2018-04-22 LAB — HIV-1 RNA QUANT-NO REFLEX-BLD

## 2018-04-22 LAB — TIQ-MISC

## 2018-04-23 LAB — BASIC METABOLIC PANEL WITH GFR
BUN: 12 mg/dL (ref 7–25)
CHLORIDE: 107 mmol/L (ref 98–110)
CO2: 24 mmol/L (ref 20–32)
Calcium: 10.5 mg/dL — ABNORMAL HIGH (ref 8.6–10.4)
Creat: 0.81 mg/dL (ref 0.60–0.93)
GFR, EST AFRICAN AMERICAN: 82 mL/min/{1.73_m2} (ref >=60)
GFR, EST NON AFRICAN AMERICAN: 71 mL/min/{1.73_m2} (ref >=60)
Glucose, Bld: 93 mg/dL (ref 65–99)
Potassium: 4.9 mmol/L (ref 3.5–5.3)
Sodium: 139 mmol/L (ref 135–146)

## 2018-04-29 ENCOUNTER — Telehealth: Payer: Self-pay | Admitting: *Deleted

## 2018-04-29 NOTE — Telephone Encounter (Signed)
Call to [facility] for PA for Diclofenac Gel 1%.  Information was given.  PA was approved 04/29/2018 thru 03/13/2019.  [facility] was called and informed of.   Sander Nephew, RN 04/29/2018 11:03 AM

## 2018-04-30 DIAGNOSIS — E559 Vitamin D deficiency, unspecified: Secondary | ICD-10-CM | POA: Diagnosis not present

## 2018-04-30 DIAGNOSIS — Z79899 Other long term (current) drug therapy: Secondary | ICD-10-CM | POA: Diagnosis not present

## 2018-04-30 DIAGNOSIS — M129 Arthropathy, unspecified: Secondary | ICD-10-CM | POA: Diagnosis not present

## 2018-04-30 DIAGNOSIS — M5136 Other intervertebral disc degeneration, lumbar region: Secondary | ICD-10-CM | POA: Diagnosis not present

## 2018-05-01 ENCOUNTER — Encounter: Payer: Self-pay | Admitting: Infectious Disease

## 2018-05-01 ENCOUNTER — Telehealth: Payer: Self-pay | Admitting: *Deleted

## 2018-05-01 NOTE — Telephone Encounter (Signed)
We have tried to taper the Xanax in the past but the patient complained that her anxiety was too great.  We therefore have no plans to taper from the alprazolam 1 mg PO QHS PRN anxiety (disp #30/month).  The tramadol was just started to assess efficacy while awaiting evaluation in a pain clinic.  The plan was to discontinue if it was not efficacious and continue if it was efficacious and well tolerated.  Please pass this information on to her pain clinic making the request.  Thank You.

## 2018-05-01 NOTE — Telephone Encounter (Signed)
Pt's daughter calls and states [facility] was asking what the plan is to stop the xanax and tramadol prescribed by [facility]. They ask daughter to call and see if there was a taper plan. Jeanella Anton NP [phone] ph# or [facility] main ph#, pt's family states NP grossman ask them to contact dr Eppie Gibson for the plan. They are pleased with the care she states. Please advise Next appt 4/17 with dr Eppie Gibson but needs answer before then

## 2018-05-02 NOTE — Telephone Encounter (Signed)
Returned call to Jeanella Anton, NP at [facility]. Read note from Dr. Eppie Gibson below. Janelle states the tramadol is not efficacious and would like this to be d'c. Plan is to start Butrans patch in 2 weeks at f/u after checking labs. Hubbard Hartshorn, RN, BSN

## 2018-05-02 NOTE — Telephone Encounter (Signed)
If the tramadol has not been efficacious than I will discontinue it at this time to allow the Pain Clinic to initiate therapy they feel would be most appropriate.

## 2018-05-28 DIAGNOSIS — E559 Vitamin D deficiency, unspecified: Secondary | ICD-10-CM | POA: Diagnosis not present

## 2018-05-28 DIAGNOSIS — R768 Other specified abnormal immunological findings in serum: Secondary | ICD-10-CM | POA: Diagnosis not present

## 2018-05-28 DIAGNOSIS — Z79899 Other long term (current) drug therapy: Secondary | ICD-10-CM | POA: Diagnosis not present

## 2018-05-28 DIAGNOSIS — M2550 Pain in unspecified joint: Secondary | ICD-10-CM | POA: Diagnosis not present

## 2018-05-29 ENCOUNTER — Encounter: Payer: Self-pay | Admitting: Internal Medicine

## 2018-05-29 DIAGNOSIS — E559 Vitamin D deficiency, unspecified: Secondary | ICD-10-CM

## 2018-05-29 DIAGNOSIS — Z8639 Personal history of other endocrine, nutritional and metabolic disease: Secondary | ICD-10-CM

## 2018-05-29 HISTORY — DX: Personal history of other endocrine, nutritional and metabolic disease: Z86.39

## 2018-05-29 HISTORY — DX: Vitamin D deficiency, unspecified: E55.9

## 2018-05-30 ENCOUNTER — Other Ambulatory Visit: Payer: Self-pay | Admitting: Internal Medicine

## 2018-05-30 DIAGNOSIS — I10 Essential (primary) hypertension: Secondary | ICD-10-CM

## 2018-06-06 DIAGNOSIS — M2550 Pain in unspecified joint: Secondary | ICD-10-CM | POA: Diagnosis not present

## 2018-06-06 DIAGNOSIS — Z681 Body mass index (BMI) 19 or less, adult: Secondary | ICD-10-CM | POA: Diagnosis not present

## 2018-06-06 DIAGNOSIS — R768 Other specified abnormal immunological findings in serum: Secondary | ICD-10-CM | POA: Diagnosis not present

## 2018-06-09 ENCOUNTER — Encounter: Payer: Self-pay | Admitting: *Deleted

## 2018-06-24 DIAGNOSIS — R768 Other specified abnormal immunological findings in serum: Secondary | ICD-10-CM | POA: Diagnosis not present

## 2018-06-24 DIAGNOSIS — M2550 Pain in unspecified joint: Secondary | ICD-10-CM | POA: Diagnosis not present

## 2018-06-26 ENCOUNTER — Ambulatory Visit (INDEPENDENT_AMBULATORY_CARE_PROVIDER_SITE_OTHER): Payer: Medicare HMO | Admitting: Internal Medicine

## 2018-06-26 ENCOUNTER — Other Ambulatory Visit: Payer: Self-pay

## 2018-06-26 DIAGNOSIS — F411 Generalized anxiety disorder: Secondary | ICD-10-CM

## 2018-06-26 DIAGNOSIS — E559 Vitamin D deficiency, unspecified: Secondary | ICD-10-CM

## 2018-06-26 DIAGNOSIS — I10 Essential (primary) hypertension: Secondary | ICD-10-CM

## 2018-06-26 DIAGNOSIS — M5136 Other intervertebral disc degeneration, lumbar region: Secondary | ICD-10-CM

## 2018-06-26 DIAGNOSIS — R69 Illness, unspecified: Secondary | ICD-10-CM | POA: Diagnosis not present

## 2018-06-26 MED ORDER — BENAZEPRIL HCL 40 MG PO TABS: 40.0000 mg | ORAL_TABLET | Freq: Every day | ORAL | 3 refills | Status: DC

## 2018-06-26 MED ORDER — TERAZOSIN HCL 5 MG PO CAPS: 5.0000 mg | ORAL_CAPSULE | Freq: Every day | ORAL | 3 refills | Status: DC

## 2018-06-26 NOTE — Assessment & Plan Note (Signed)
She has not checked her BP recently, but was previously controlled and she does not have any symptoms which are concerning.  Continue her current therapy.  Monitor BP at next visit.

## 2018-06-26 NOTE — Progress Notes (Signed)
[facility] Internal [facility] Residency Telephone Encounter Continuity Care Appointment  HPI:   This telephone encounter was created for [name] on 06/26/2018 for the following purpose/cc follow up on pain management.  Been going well.  Still with a little bit of pain.  Tramadol is helping, but not completely managing her pain.  She has seen a pain specialist and she was sent to a rheumatologist.  There she found out that she osteoarthritis, she did not start a new medication.  She forgot her BP monitor and she hasn't taken it for about a week.  No headaches, no chest pain, no dizziness.  She is due to follow up with the pain specialist tomorrow. She is now taking high dose vitamin D as well.  She needs refills on benazepril and terazosin which I provided for her.    Past Medical History:  Past Medical History:  Diagnosis Date  . Acute renal failure (ARF) ([place]) 08/17/2017  . Anxiety 04/05/2012  . Bilateral sensorineural hearing loss 06/11/2014   Mild to moderate on the left side and slight to mild on the right side per audiometry 05/2014.  Hearing aides with possible masking of tinnitus recommended but patient wished to defer secondary to finances.  . Blood transfusion without reported diagnosis    pt denies  . Bursitis of right shoulder 07/12/2012   s/p shoulder injection 07/12/2012   . Cataract of right eye   . Constipation due to pain medication 04/27/2010  . Diverticulosis 02/08/2012   Extensive left-sided diverticula on colonoscopy March 2012 per Dr. Gala Romney   . Essential hypertension 07/20/2006  . Genital herpes 07/20/2006  . 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 Heart murmur 1961  . Human immunodeficiency virus disease ([place]) 03/27/1986  . Hyperlipidemia LDL goal < 100 04/05/2012  . Hypokalemia 04/12/2018  . Long-term current use of opiate analgesic 03/17/2016  . Lumbar degenerative disc disease 07/20/2006   With chronic back pain   . Marijuana use 07/03/2016  . Micturition syncope  09/20/2015  . Opiate dependence ([place]) 04/12/2018  . 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[place]) 11/01/2011   s/p aortobifem bypass 2009   . Periumbilical hernia 06/11/2876   1 cm left periumbilical abdominal wall defect  . 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 Right rotator cuff tear 02/01/2013   Responds to periodic steroid injections  . Seborrhea 09/01/2010  . Shoulder pain, right 12/04/2017  . Small bowel obstruction due to adhesions ([place]) 02/08/2012   s/p Exploratory laparotomy, lysis of adhesions 02/12/12    . 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 Tobacco abuse 02/19/2012  . Tobacco abuse   . Vasovagal syncope 02/15/2015  . Vitamin D deficiency 05/29/2018   Vitamin D 18.96 (04/30/2018), treated with ergocalciferol 50,000 units PO QWk X 4 weeks  . Voiding dysfunction    s/p cystoscopy and meatal dilation Dec 2005      ROS:   Continues with back pain, denies chest pain, fever, chills, cough, recent illness, dizziness, lightheadedness.     Assessment / Plan / Recommendations:   Please see A&P under problem oriented charting for assessment of the patient's acute and chronic medical conditions.    Consent and Medical Decision Making:     This is a telephone encounter between [place] and Gilles Chiquito on 06/26/2018 for pain management. The visit was conducted with the patient located at home and Gilles Chiquito at [facility].  The patient's identity was confirmed using their DOB and current address. The patient has consented to being evaluated through a telephone encounter and understands the associated risks (an examination cannot be done and the patient may need to come in for an appointment) / benefits (allows the patient to remain at home, decreasing exposure to coronavirus). I personally spent 10 minutes on medical discussion.    Marland Kitchen

## 2018-06-26 NOTE — Assessment & Plan Note (Signed)
Well controlled on her current dose of Xanax.  I advised her to continue this.

## 2018-06-26 NOTE — Assessment & Plan Note (Signed)
Now on high dose vitamin D.  Recheck at next opportunity.

## 2018-06-26 NOTE — Patient Instructions (Signed)
Advised patient of plan over the phone.

## 2018-06-26 NOTE — Assessment & Plan Note (Signed)
Pain is partially controlled on tramadol.  She has seen a pain specialist who started her on Vitamin D and sent her to rheumatology for an evaluation.  Rheumatology felt that she had osteoarthritis.  She is due to see the pain specialist again tomorrow and she may be started on a new pain medication which she hopes will be helpful.   Plan Follow pain management recommendations.  I advised her that we would no longer Rx pain medications once she is well established with her pain specialists, which she expressed understanding of.

## 2018-06-27 DIAGNOSIS — G894 Chronic pain syndrome: Secondary | ICD-10-CM | POA: Diagnosis not present

## 2018-06-27 DIAGNOSIS — Z79899 Other long term (current) drug therapy: Secondary | ICD-10-CM | POA: Diagnosis not present

## 2018-06-27 DIAGNOSIS — M5136 Other intervertebral disc degeneration, lumbar region: Secondary | ICD-10-CM | POA: Diagnosis not present

## 2018-06-28 ENCOUNTER — Ambulatory Visit: Payer: Medicare HMO | Admitting: Internal Medicine

## 2018-07-01 ENCOUNTER — Other Ambulatory Visit: Payer: Self-pay | Admitting: *Deleted

## 2018-07-01 DIAGNOSIS — M810 Age-related osteoporosis without current pathological fracture: Secondary | ICD-10-CM

## 2018-07-02 MED ORDER — ALENDRONATE SODIUM 70 MG PO TABS: 70.0000 mg | ORAL_TABLET | ORAL | 3 refills | Status: DC

## 2018-07-23 DIAGNOSIS — G894 Chronic pain syndrome: Secondary | ICD-10-CM | POA: Diagnosis not present

## 2018-07-23 DIAGNOSIS — Z79899 Other long term (current) drug therapy: Secondary | ICD-10-CM | POA: Diagnosis not present

## 2018-07-23 DIAGNOSIS — M1990 Unspecified osteoarthritis, unspecified site: Secondary | ICD-10-CM | POA: Diagnosis not present

## 2018-07-23 DIAGNOSIS — E559 Vitamin D deficiency, unspecified: Secondary | ICD-10-CM | POA: Diagnosis not present

## 2018-08-20 DIAGNOSIS — M75101 Unspecified rotator cuff tear or rupture of right shoulder, not specified as traumatic: Secondary | ICD-10-CM | POA: Diagnosis not present

## 2018-08-20 DIAGNOSIS — G894 Chronic pain syndrome: Secondary | ICD-10-CM | POA: Diagnosis not present

## 2018-08-20 DIAGNOSIS — Z79899 Other long term (current) drug therapy: Secondary | ICD-10-CM | POA: Diagnosis not present

## 2018-08-26 ENCOUNTER — Telehealth: Payer: Self-pay | Admitting: *Deleted

## 2018-08-26 DIAGNOSIS — M5136 Other intervertebral disc degeneration, lumbar region: Secondary | ICD-10-CM

## 2018-08-26 NOTE — Telephone Encounter (Signed)
Received fax from [facility] requesting refill on Tramadol. No longer on current med list. Will forward to PCP for consideration. Hubbard Hartshorn, RN, BSN

## 2018-08-27 MED ORDER — TRAMADOL HCL 50 MG PO TABS: 100.0000 mg | ORAL_TABLET | Freq: Three times a day (TID) | ORAL | 5 refills | Status: DC | PRN

## 2018-08-27 NOTE — Telephone Encounter (Signed)
Reviewed chart.  Patient was supposed to be seen by a pain specialist.  Does not appear by PDMP review that she has been prescribed medications by any other providers.  I would anticipate that she has not been seen yet or missed the appointment.  She has an appointment with me tomorrow and we will further discuss.   Gilles Chiquito, MD

## 2018-08-28 ENCOUNTER — Encounter: Payer: Self-pay | Admitting: Internal Medicine

## 2018-08-28 ENCOUNTER — Ambulatory Visit (INDEPENDENT_AMBULATORY_CARE_PROVIDER_SITE_OTHER): Payer: Medicare HMO | Admitting: Internal Medicine

## 2018-08-28 ENCOUNTER — Other Ambulatory Visit: Payer: Self-pay

## 2018-08-28 VITALS — BP 156/96 | HR 62 | Temp 98.2°F | Wt 129.0 lb

## 2018-08-28 DIAGNOSIS — E559 Vitamin D deficiency, unspecified: Secondary | ICD-10-CM

## 2018-08-28 DIAGNOSIS — M5136 Other intervertebral disc degeneration, lumbar region: Secondary | ICD-10-CM

## 2018-08-28 DIAGNOSIS — K5903 Drug induced constipation: Secondary | ICD-10-CM | POA: Diagnosis not present

## 2018-08-28 DIAGNOSIS — I10 Essential (primary) hypertension: Secondary | ICD-10-CM

## 2018-08-28 DIAGNOSIS — K5730 Diverticulosis of large intestine without perforation or abscess without bleeding: Secondary | ICD-10-CM

## 2018-08-28 DIAGNOSIS — M75101 Unspecified rotator cuff tear or rupture of right shoulder, not specified as traumatic: Secondary | ICD-10-CM

## 2018-08-28 DIAGNOSIS — I739 Peripheral vascular disease, unspecified: Secondary | ICD-10-CM | POA: Diagnosis not present

## 2018-08-28 DIAGNOSIS — M810 Age-related osteoporosis without current pathological fracture: Secondary | ICD-10-CM | POA: Diagnosis not present

## 2018-08-28 DIAGNOSIS — R69 Illness, unspecified: Secondary | ICD-10-CM | POA: Diagnosis not present

## 2018-08-28 DIAGNOSIS — Z0000 Encounter for general adult medical examination without abnormal findings: Secondary | ICD-10-CM

## 2018-08-28 DIAGNOSIS — F411 Generalized anxiety disorder: Secondary | ICD-10-CM

## 2018-08-28 DIAGNOSIS — B20 Human immunodeficiency virus [HIV] disease: Secondary | ICD-10-CM | POA: Diagnosis not present

## 2018-08-28 NOTE — Patient Instructions (Signed)
Jessica [name] - -  It was a pleasure to see you today!  You will need to be scheduled for a mammogram this year.    For your nose bleeds, please try nasal saline spray ([place]) at night.   You had a shoulder injection today, please call the clinic if you have any issues at the injection site or in the shoulder.      Shoulder Impingement Syndrome  Shoulder impingement syndrome is a condition that causes pain when connective tissues (tendons) surrounding the shoulder joint become pinched. These tendons are part of the group of muscles and tissues that help to stabilize the shoulder (rotator cuff). Beneath the rotator cuff is a fluid-filled sac (bursa) that allows the muscles and tendons to glide smoothly. The bursa may become swollen or irritated (bursitis). Bursitis, swelling in the rotator cuff tendons, or both conditions can decrease how much space is under a bone in the shoulder joint (acromion), resulting in impingement. What are the causes? Shoulder impingement syndrome may be caused by bursitis or swelling of the rotator cuff tendons, which may result from:  Repetitive overhead arm movements.  Falling onto the shoulder.  Weakness in the shoulder muscles. What increases the risk? You may be more likely to develop this condition if you:  Play sports that involve throwing, such as baseball.  Participate in sports such as tennis, volleyball, and swimming.  Work as a Curator, Games developer, or Architect. Some people are also more likely to develop impingement syndrome because of the shape of their acromion bone. What are the signs or symptoms? The main symptom of this condition is pain on the front or side of the shoulder. The pain may:  Get worse when lifting or raising the arm.  Get worse at night.  Wake you up from sleeping.  Feel sharp when the shoulder is moved and then fade to an ache. Other symptoms may include:  Tenderness.  Stiffness.  Inability to raise the arm  above shoulder level or behind the body.  Weakness. How is this diagnosed? This condition may be diagnosed based on:  Your symptoms and medical history.  A physical exam.  Imaging tests, such as: ? X-rays. ? MRI. ? Ultrasound. How is this treated? This condition may be treated by:  Resting your shoulder and avoiding all activities that cause pain or put stress on the shoulder.  Icing your shoulder.  NSAIDs to help reduce pain and swelling.  One or more injections of medicines to numb the area and reduce inflammation.  Physical therapy.  Surgery. This may be needed if nonsurgical treatments have not helped. Surgery may involve repairing the rotator cuff, reshaping the acromion, or removing the bursa. Follow these instructions at home: Managing pain, stiffness, and swelling   If directed, put ice on the injured area. ? Put ice in a plastic bag. ? Place a towel between your skin and the bag. ? Leave the ice on for 20 minutes, 2-3 times a day. Activity  Rest and return to your normal activities as told by your health care provider. Ask your health care provider what activities are safe for you.  Do exercises as told by your health care provider. General instructions  Do not use any products that contain nicotine or tobacco, such as cigarettes, e-cigarettes, and chewing tobacco. These can delay healing. If you need help quitting, ask your health care provider.  Ask your health care provider when it is safe for you to drive.  Take over-the-counter and  prescription medicines only as told by your health care provider.  Keep all follow-up visits as told by your health care provider. This is important. How is this prevented?  Give your body time to rest between periods of activity.  Be safe and responsible while being active. This will help you avoid falls.  Maintain physical fitness, including strength and flexibility. Contact a health care provider if:  Your  symptoms have not improved after 1-2 months of treatment and rest.  You cannot lift your arm away from your body. Summary  Shoulder impingement syndrome is a condition that causes pain when connective tissues (tendons) surrounding the shoulder joint become pinched.  The main symptom of this condition is pain on the front or side of the shoulder.  This condition is usually treated with rest, ice, and pain medicines as needed. This information is not intended to replace advice given to you by your health care provider. Make sure you discuss any questions you have with your health care provider. Document Released: 02/27/2005 Document Revised: 08/22/2017 Document Reviewed: 08/22/2017 Elsevier Interactive Patient Education  [employer].

## 2018-08-28 NOTE — Progress Notes (Signed)
PROCEDURE NOTE  PROCEDURE: right shoulder joint steroid injection.  PREOPERATIVE DIAGNOSIS: Bursitis of the right shoulder.  POSTOPERATIVE DIAGNOSIS: Bursitis of the right shoulder.  PROCEDURE: The patient was apprised of the risks and the benefits of the procedure and informed consent was obtained, as witnessed by [facility]. Time-out procedure was performed, with confirmation of the patient's name, date of birth, and correct identification of the right shoulder to be injected. The patient's shoulder was then marked at the appropriate site for injection placement. The shoulder was sterilely prepped with Betadine. A 40 mg (1 milliliter) solution of Kenalog was drawn up into a 3 mL syringe with a 2 mL of 1% lidocaine. The patient was injected with a 25 gauge needle at the lateral  aspect of her  right shoulder. There were no complications. The patient tolerated the procedure well. There was minimal bleeding. The patient was instructed to ice her shoulder upon leaving clinic and refrain from overuse over the next 3 days. The patient was instructed to go to the emergency room with any usual pain, swelling, or redness occurred in the injected area. The patient was given a followup appointment to evaluate response to the injection to his increased range of motion and reduction of pain.  The procedure was supervised by attending physician, Dr. Angelia Mould.
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
## 2018-08-29 ENCOUNTER — Telehealth: Payer: Self-pay | Admitting: Internal Medicine

## 2018-08-29 LAB — CBC
Hematocrit: 40 % (ref 34.0–46.6)
Hemoglobin: 13.9 g/dL (ref 11.1–15.9)
MCH: 33 pg (ref 26.6–33.0)
MCHC: 34.8 g/dL (ref 31.5–35.7)
MCV: 95 fL (ref 79–97)
Platelets: 294 10*3/uL (ref 150–450)
RBC: 4.21 x10E6/uL (ref 3.77–5.28)
RDW: 12.7 % (ref 11.7–15.4)
WBC: 7.6 10*3/uL (ref 3.4–10.8)

## 2018-08-29 LAB — BMP8+ANION GAP
Anion Gap: 18 mmol/L (ref 10.0–18.0)
BUN/Creatinine Ratio: 25 (ref 12–28)
BUN: 15 mg/dL (ref 8–27)
CO2: 15 mmol/L — ABNORMAL LOW (ref 20–29)
Calcium: 10.2 mg/dL (ref 8.7–10.3)
Chloride: 109 mmol/L — ABNORMAL HIGH (ref 96–106)
Creatinine, Ser: 0.61 mg/dL (ref 0.57–1.00)
GFR calc Af Amer: 102 mL/min/{1.73_m2} (ref >59)
GFR calc non Af Amer: 88 mL/min/{1.73_m2} (ref >59)
Glucose: 99 mg/dL (ref 65–99)
Potassium: 4 mmol/L (ref 3.5–5.2)
Sodium: 142 mmol/L (ref 134–144)

## 2018-08-29 LAB — VITAMIN D 25 HYDROXY (VIT D DEFICIENCY, FRACTURES): Vit D, 25-Hydroxy: 53.6 ng/mL (ref 30.0–100.0)

## 2018-08-29 NOTE — Telephone Encounter (Signed)
Called and spoke with patient about blood work.  All blood work improved or stable.  Identity confirmed with birthdate and name.   Patient will send disability placard for car to office for me to fill out.   Gilles Chiquito, MD

## 2018-09-02 NOTE — Assessment & Plan Note (Signed)
BP is elevated today to 149/66.  She tells me that she has been having "vagal" episodes and recently fell and was helped up by a friend.  She is not sure what medications she is taking as her daughter puts them all in a pill box for her.  She remembers amlodipine and benazepril, but is not sure about terazosin.  Her diastolic pressure is pretty low today, so we may have to tolerate a higher systolic to allow for a normal to low diastolic.  On review of chart, her BP has been relatively well controlled on this regimen.   Will continue 3 drug regimen.  Will discuss with family at next visit to find out if she is still taking Terazosin.

## 2018-09-02 NOTE — Assessment & Plan Note (Signed)
She reports no leg pain today.  She is using a cane.  She has ABI's in the system from 2014 which were normal.  On review of chart, she had a bypass in 2009.  Since then has had no pain or other issues.  Continuing to provide good lifestyle modification and also treat her HLD.  She is on a statin (atorvastatin), aspirin, plavix.  She continues to smoke, however, so this will need to be further discussed at every visit.   Plan Continue statin, aspirin, plavix.

## 2018-09-02 NOTE — Assessment & Plan Note (Signed)
Check Vitamin D today 

## 2018-09-02 NOTE — Assessment & Plan Note (Signed)
She is taking alendronate.  Her Ca was mildly high and I advised her to stop taking OsCal.

## 2018-09-02 NOTE — Assessment & Plan Note (Signed)
She had colonoscopy in 2012.  Diverticulosis without bleeding.  One hyperplastic polyp.  She is due for repeat in 2022.

## 2018-09-02 NOTE — Assessment & Plan Note (Signed)
Now seeing a pain specialist.  Stop tramadol.  She will let us know what her new regimen is.

## 2018-09-02 NOTE — Assessment & Plan Note (Signed)
She is well controlled on Biktarvy.  Last CD4 was 1300 and Viral load undetectable in January.  Continue follow up with ID.

## 2018-09-02 NOTE — Assessment & Plan Note (Signed)
MMG: 2017 - negative, I reminded her to schedule PAP: 2012 - negative.  She had an abnormal in 2008.  Based on current recommendations she should continue PAP smears for 20 years after abnormal with adequate treatment.  Will discuss with her at next visit.   DEXA: 2017 - Osteoporosis, on alendronate Lung: Current smoker, CT scan 2019 showed no nodules.  Discuss routine screening at next visit.  Colon: 2012 - hyperplastic polyp, due in 2022 Immunizations, up to date.

## 2018-09-02 NOTE — Assessment & Plan Note (Signed)
Well controlled today.  She has no complaints.

## 2018-09-02 NOTE — Assessment & Plan Note (Signed)
She is having increased pain today.  She has been on tramadol for this and DDD.  She would like to stop the tramadol and have her pain clinic start her on medication.  She would like to have a shoulder injection today which was performed by Dr.'s Heber Hendrix and Leonard.   Continue to monitor.  Diclofenac topical PRN

## 2018-09-02 NOTE — Assessment & Plan Note (Signed)
Well controlled with once daily Xanax.  Continue.

## 2018-09-24 ENCOUNTER — Other Ambulatory Visit: Payer: Self-pay

## 2018-09-24 DIAGNOSIS — B20 Human immunodeficiency virus [HIV] disease: Secondary | ICD-10-CM

## 2018-09-24 DIAGNOSIS — Z79899 Other long term (current) drug therapy: Secondary | ICD-10-CM | POA: Diagnosis not present

## 2018-09-24 DIAGNOSIS — Z113 Encounter for screening for infections with a predominantly sexual mode of transmission: Secondary | ICD-10-CM

## 2018-09-25 ENCOUNTER — Other Ambulatory Visit: Payer: Self-pay | Admitting: Internal Medicine

## 2018-09-25 DIAGNOSIS — I10 Essential (primary) hypertension: Secondary | ICD-10-CM

## 2018-09-26 ENCOUNTER — Other Ambulatory Visit (HOSPITAL_COMMUNITY)
Admission: RE | Admit: 2018-09-26 | Discharge: 2018-09-26 | Disposition: A | Discharge status: Home / Self Care | Payer: Medicare HMO | Source: Ambulatory Visit | Attending: Infectious Disease | Admitting: Infectious Disease

## 2018-09-26 ENCOUNTER — Encounter: Payer: Self-pay | Admitting: Infectious Disease

## 2018-09-26 ENCOUNTER — Other Ambulatory Visit: Payer: Medicare HMO

## 2018-09-26 ENCOUNTER — Other Ambulatory Visit: Payer: Self-pay

## 2018-09-26 DIAGNOSIS — Z113 Encounter for screening for infections with a predominantly sexual mode of transmission: Secondary | ICD-10-CM

## 2018-09-26 DIAGNOSIS — B20 Human immunodeficiency virus [HIV] disease: Secondary | ICD-10-CM

## 2018-09-26 DIAGNOSIS — R69 Illness, unspecified: Secondary | ICD-10-CM | POA: Diagnosis not present

## 2018-09-27 LAB — T-HELPER CELLS (CD4) COUNT (NOT AT ARMC)
CD4 % Helper T Cell: 38 % (ref 33–65)
CD4 T Cell Abs: 1377 /uL (ref 400–1790)

## 2018-10-01 LAB — URINE CYTOLOGY ANCILLARY ONLY
Chlamydia: NEGATIVE
Neisseria Gonorrhea: NEGATIVE

## 2018-10-02 LAB — COMPREHENSIVE METABOLIC PANEL
AG Ratio: 1.4 (calc) (ref 1.0–2.5)
ALT: 6 U/L (ref 6–29)
AST: 14 U/L (ref 10–35)
Albumin: 4.6 g/dL (ref 3.6–5.1)
Alkaline phosphatase (APISO): 52 U/L (ref 37–153)
BUN: 22 mg/dL (ref 7–25)
CO2: 17 mmol/L — ABNORMAL LOW (ref 20–32)
Calcium: 10 mg/dL (ref 8.6–10.4)
Chloride: 109 mmol/L (ref 98–110)
Creat: 0.82 mg/dL (ref 0.60–0.93)
Globulin: 3.3 g/dL (calc) (ref 1.9–3.7)
Glucose, Bld: 127 mg/dL — ABNORMAL HIGH (ref 65–99)
Potassium: 4.1 mmol/L (ref 3.5–5.3)
Sodium: 137 mmol/L (ref 135–146)
Total Bilirubin: 0.4 mg/dL (ref 0.2–1.2)
Total Protein: 7.9 g/dL (ref 6.1–8.1)

## 2018-10-02 LAB — CBC WITH DIFFERENTIAL/PLATELET
Absolute Monocytes: 265 cells/uL (ref 200–950)
Basophils Absolute: 88 cells/uL (ref 0–200)
Basophils Relative: 1.3 %
Eosinophils Absolute: 68 cells/uL (ref 15–500)
Eosinophils Relative: 1 %
HCT: 41 % (ref 35.0–45.0)
Hemoglobin: 13.6 g/dL (ref 11.7–15.5)
Lymphs Abs: 3849 cells/uL (ref 850–3900)
MCH: 33.1 pg — ABNORMAL HIGH (ref 27.0–33.0)
MCHC: 33.2 g/dL (ref 32.0–36.0)
MCV: 99.8 fL (ref 80.0–100.0)
MPV: 10.2 fL (ref 7.5–12.5)
Monocytes Relative: 3.9 %
Neutro Abs: 2530 cells/uL (ref 1500–7800)
Neutrophils Relative %: 37.2 %
Platelets: 224 10*3/uL (ref 140–400)
RBC: 4.11 10*6/uL (ref 3.80–5.10)
RDW: 12.2 % (ref 11.0–15.0)
Total Lymphocyte: 56.6 %
WBC: 6.8 10*3/uL (ref 3.8–10.8)

## 2018-10-02 LAB — RPR: RPR Ser Ql: NONREACTIVE

## 2018-10-02 LAB — HIV-1 RNA QUANT-NO REFLEX-BLD
HIV 1 RNA Quant: 26 copies/mL — ABNORMAL HIGH
HIV-1 RNA Quant, Log: 1.41 Log copies/mL — ABNORMAL HIGH

## 2018-10-03 ENCOUNTER — Other Ambulatory Visit: Payer: Self-pay | Admitting: *Deleted

## 2018-10-03 DIAGNOSIS — F411 Generalized anxiety disorder: Secondary | ICD-10-CM

## 2018-10-04 MED ORDER — ALPRAZOLAM 0.5 MG PO TABS: 0.5000 mg | ORAL_TABLET | Freq: Every evening | ORAL | 1 refills | Status: DC | PRN

## 2018-10-04 NOTE — Telephone Encounter (Signed)
30 tab, normal.  1 refill is fine.  Thank you!

## 2018-10-09 ENCOUNTER — Telehealth: Payer: Self-pay | Admitting: Infectious Disease

## 2018-10-09 NOTE — Telephone Encounter (Signed)
COVID-19 Pre-Screening Questions:10/09/18    Do you currently have a fever (>100 F), chills or unexplained body aches?NO   Are you currently experiencing new cough, shortness of breath, sore throat, runny nose? NO   Have you recently travelled outside the state of [place] in the last 14 days? NO   Have you been in contact with someone that is currently pending confirmation of Covid19 testing or has been confirmed to have the [place] virus?  NO  **If the patient answers NO to ALL questions -  advise the patient to please call the clinic before coming to the office should any symptoms develop.

## 2018-10-10 ENCOUNTER — Ambulatory Visit (INDEPENDENT_AMBULATORY_CARE_PROVIDER_SITE_OTHER): Payer: Medicare HMO | Admitting: Infectious Disease

## 2018-10-10 ENCOUNTER — Other Ambulatory Visit: Payer: Self-pay

## 2018-10-10 ENCOUNTER — Encounter: Payer: Self-pay | Admitting: Infectious Disease

## 2018-10-10 VITALS — BP 119/70 | HR 67 | Temp 98.4°F

## 2018-10-10 DIAGNOSIS — B20 Human immunodeficiency virus [HIV] disease: Secondary | ICD-10-CM

## 2018-10-10 DIAGNOSIS — I10 Essential (primary) hypertension: Secondary | ICD-10-CM | POA: Diagnosis not present

## 2018-10-10 DIAGNOSIS — F17200 Nicotine dependence, unspecified, uncomplicated: Secondary | ICD-10-CM

## 2018-10-10 DIAGNOSIS — E7800 Pure hypercholesterolemia, unspecified: Secondary | ICD-10-CM

## 2018-10-10 DIAGNOSIS — R69 Illness, unspecified: Secondary | ICD-10-CM | POA: Diagnosis not present

## 2018-10-10 MED ORDER — CHANTIX STARTING MONTH PAK 0.5 MG X 11 & 1 MG X 42 PO TABS: ORAL_TABLET | ORAL | 0 refills | Status: DC

## 2018-10-10 NOTE — Progress Notes (Signed)
Chief complaint: Follow-up for HIV on medication, concerned about problems with her bowels and whether medication she is seen on television would help her with this  Subjective:    Patient ID: Jessica [name], female    DOB: November 14, 1940, 78 y.o.   MRN: [id number]  HPI  Jessica [name] is a 78 y.o. female with HIV infection who has done superbly well on her antiviral regimen, changed from Atripla to [facility] and DESCOVY -->BIKTARVY with reasonable virological suppression.  She is currently living at home with her daughter and they are sheltering in place.  Unfortunately she and her daughter do smoke tobacco and I would asked him to please stop this for long-term and short-term benefits    Past Medical History:  Diagnosis Date  . Acute renal failure (ARF) ([place]) 08/17/2017  . Anxiety 04/05/2012  . Bilateral sensorineural hearing loss 06/11/2014   Mild to moderate on the left side and slight to mild on the right side per audiometry 05/2014.  Hearing aides with possible masking of tinnitus recommended but patient wished to defer secondary to finances.  . Blood transfusion without reported diagnosis    pt denies  . Bursitis of right shoulder 07/12/2012   s/p shoulder injection 07/12/2012   . Cataract of right eye   . Constipation due to pain medication 04/27/2010  . Diverticulosis 02/08/2012   Extensive left-sided diverticula on colonoscopy March 2012 per Dr. Gala Romney   . Essential hypertension 07/20/2006  . Genital herpes 07/20/2006  . 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 Heart murmur 1961  . Human immunodeficiency virus disease ([place]) 03/27/1986  . Hyperlipidemia LDL goal < 100 04/05/2012  . Hypokalemia 04/12/2018  . Long-term current use of opiate analgesic 03/17/2016  . Lumbar degenerative disc disease 07/20/2006   With chronic back pain   . Marijuana use 07/03/2016  . Micturition syncope 09/20/2015  . Opiate dependence ([place]) 04/12/2018  . 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[place]) 11/01/2011   s/p aortobifem bypass 2009   . Periumbilical hernia 3/0/8657   1 cm left periumbilical abdominal wall defect  . 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 Right rotator cuff tear 02/01/2013   Responds to periodic steroid injections  . Seborrhea 09/01/2010  . Shoulder pain, right 12/04/2017  . Small bowel obstruction due to adhesions ([place]) 02/08/2012   s/p Exploratory laparotomy, lysis of adhesions 02/12/12    . 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 Tobacco abuse 02/19/2012  . Tobacco abuse   . Vasovagal syncope 02/15/2015  . Vitamin D deficiency 05/29/2018   Vitamin D 18.96 (04/30/2018), treated with ergocalciferol 50,000 units PO QWk X 4 weeks  . Voiding dysfunction    s/p cystoscopy and meatal dilation Dec 2005    Past Surgical History:  Procedure Laterality Date  . ABDOMINAL HYSTERECTOMY    . AORTO-FEMORAL BYPASS GRAFT  04/2007  . APPENDECTOMY    . BREAST SURGERY     Breast biopsy: negative  . CHOLECYSTECTOMY    . COLECTOMY  01/2011   Dr. Margot Chimes; "took out 12 inches of small intestiines and removed blockage"  . EYE SURGERY    . LAPAROTOMY  02/12/2012   Procedure: EXPLORATORY LAPAROTOMY;  Surgeon: Stark Klein, MD;  Location: [facility] OR;  Service: General;  Laterality: N/A;  Exploratory Laparotomy, lysis of adhesions  . SMALL INTESTINE SURGERY      Family History  Problem Relation Age of Onset  . Kidney  failure Mother   . Diabetes Mother   . Hypertension Mother   . Heart disease Mother   . Glaucoma Father   . Congestive Heart Failure Sister   . Diabetes Sister   . Kidney disease Sister   . Diabetes Brother   . Unexplained death Brother 37       Automobile accident  . Hypothyroidism Daughter   . Arthritis Daughter        Neck/Back  . Healthy Son   . HIV/AIDS Brother   . HIV Daughter   . Kidney disease Daughter   . Arthritis Son        Knee      Social History   Socioeconomic History  . Marital status:  Widowed    Spouse name: Not on file  . Number of children: 4  . Years of education: 2y college  . Highest education level: Not on file  Occupational History  . Occupation: retired    Comment: previously worked as a Designer, fashion/clothing for [employer]  . Financial resource strain: Not on file  . Food insecurity    Worry: Not on file    Inability: Not on file  . Transportation needs    Medical: Not on file    Non-medical: Not on file  Tobacco Use  . Smoking status: Current Every Day Smoker    Packs/day: 0.50    Years: 50.00    Pack years: 25.00    Types: Cigarettes  . Smokeless tobacco: Never Used  Substance and Sexual Activity  . Alcohol use: No    Alcohol/week: 0.0 standard drinks    Comment: "last drink of alcohol ~ 1977"  . Drug use: No    Comment: cutting back on chantix, 1 cigarette this morning  . Sexual activity: Never    Partners: Male  Lifestyle  . Physical activity    Days per week: Not on file    Minutes per session: Not on file  . Stress: Not on file  Relationships  . Social Herbalist on phone: Not on file    Gets together: Not on file    Attends religious service: Not on file    Active member of club or organization: Not on file    Attends meetings of clubs or organizations: Not on file    Relationship status: Not on file  Other Topics Concern  . Not on file  Social History Narrative   Lives alone in [place], [place]    Allergies  Allergen Reactions  . Hctz [Hydrochlorothiazide] Other (See Comments)    Dizziness, syncope; does NOT wish to take anymore     Current Outpatient Medications:  .  alendronate (FOSAMAX) 70 MG tablet, Take 1 tablet (70 mg total) by mouth once a week., Disp: 12 tablet, Rfl: 3 .  ALPRAZolam (XANAX) 0.5 MG tablet, Take 1 tablet (0.5 mg total) by mouth at bedtime as needed for anxiety., Disp: 30 tablet, Rfl: 1 .  amLODipine (NORVASC) 10 MG tablet, Take 1 tablet (10 mg total) by mouth daily., Disp: 90 tablet, Rfl: 3 .   aspirin 81 MG EC tablet, Take 81 mg by mouth daily.  , Disp: , Rfl:  .  atorvastatin (LIPITOR) 10 MG tablet, TAKE 1 TABLET BY MOUTH EVERY DAY (STOP LOVASTATIN), Disp: 30 tablet, Rfl: 5 .  benazepril (LOTENSIN) 40 MG tablet, Take 1 tablet (40 mg total) by mouth daily., Disp: 90 tablet, Rfl: 3 .  bictegravir-emtricitabine-tenofovir AF (BIKTARVY)  50-200-25 MG TABS tablet, Take 1 tablet by mouth daily., Disp: 30 tablet, Rfl: 11 .  COMBIGAN 0.2-0.5 % ophthalmic solution, Place 1 drop into the left eye 2 (two) times daily. , Disp: , Rfl:  .  diclofenac sodium (VOLTAREN) 1 % GEL, Apply 4 g topically 4 (four) times daily as needed. APPLY 2 GRAMS TO AFFECTED AREAS FOUR TIMES DAILY AS NEEDED FOR PAIN (Patient taking differently: Apply 2 g topically 4 (four) times daily as needed (pain). ), Disp: 300 g, Rfl: 11 .  docusate sodium (COLACE) 100 MG capsule, Take 100 mg by mouth 2 (two) times daily., Disp: , Rfl:  .  dorzolamide (TRUSOPT) 2 % ophthalmic solution, Place 1 drop into both eyes 2 (two) times daily., Disp: , Rfl: 5 .  latanoprost (XALATAN) 0.005 % ophthalmic solution, Place 1 drop into both eyes at bedtime., Disp: , Rfl:  .  Multiple Vitamin (MULTIVITAMIN WITH MINERALS) TABS, Take 1 tablet by mouth daily., Disp: , Rfl:  .  terazosin (HYTRIN) 5 MG capsule, Take 1 capsule (5 mg total) by mouth at bedtime., Disp: 30 capsule, Rfl: 3        Review of Systems  Constitutional: Negative for activity change, appetite change, chills, diaphoresis, fatigue and unexpected weight change.  HENT: Negative for congestion, ear pain, rhinorrhea, sinus pressure, sneezing, sore throat and trouble swallowing.   Eyes: Negative for photophobia and visual disturbance.  Respiratory: Negative for cough, chest tightness, shortness of breath, wheezing and stridor.   Cardiovascular: Negative for palpitations.  Gastrointestinal: Positive for diarrhea. Negative for abdominal distention, anal bleeding, blood in stool,  constipation, nausea and vomiting.  Genitourinary: Negative for difficulty urinating, flank pain and hematuria.  Musculoskeletal: Negative for gait problem and joint swelling.  Skin: Negative for color change, pallor and wound.  Neurological: Negative for dizziness, tremors and light-headedness.  Hematological: Negative for adenopathy. Does not bruise/bleed easily.  Psychiatric/Behavioral: Negative for agitation, behavioral problems, confusion, decreased concentration, dysphoric mood and sleep disturbance.        Objective:   Physical Exam  Constitutional: She is oriented to person, place, and time. She appears well-developed and well-nourished. No distress.  HENT:  Head: Normocephalic and atraumatic.  Mouth/Throat: No oropharyngeal exudate.  Eyes: Conjunctivae and EOM are normal. No scleral icterus.  Neck: Normal range of motion. Neck supple.  Cardiovascular: Normal rate and regular rhythm.  Pulmonary/Chest: Effort normal. No respiratory distress. She has no wheezes.  Abdominal: She exhibits no distension.  Musculoskeletal:        General: No edema.  Neurological: She is alert and oriented to person, place, and time. She exhibits normal muscle tone. Coordination normal.  Skin: Skin is warm and dry. No rash noted. She is not diaphoretic. No erythema. No pallor.  Psychiatric: She has a normal mood and affect. Her behavior is normal. Judgment and thought content normal.  Nursing note and vitals reviewed.         Assessment & Plan:     HIV: Continue BIKTARVY  Renewed her SPAP, she can return to clinic in January   HTN being managed by PCP  Hyperlipidemia: Continue Lipitor   Smoking: Driving Chantix starter pack and continuation pack

## 2018-10-15 ENCOUNTER — Other Ambulatory Visit: Payer: Self-pay | Admitting: Infectious Disease

## 2018-10-15 DIAGNOSIS — E785 Hyperlipidemia, unspecified: Secondary | ICD-10-CM

## 2018-10-16 ENCOUNTER — Other Ambulatory Visit: Payer: Self-pay | Admitting: Infectious Disease

## 2018-10-16 DIAGNOSIS — M2550 Pain in unspecified joint: Secondary | ICD-10-CM | POA: Diagnosis not present

## 2018-10-16 DIAGNOSIS — B20 Human immunodeficiency virus [HIV] disease: Secondary | ICD-10-CM

## 2018-10-16 DIAGNOSIS — Z79899 Other long term (current) drug therapy: Secondary | ICD-10-CM | POA: Diagnosis not present

## 2018-10-16 DIAGNOSIS — G894 Chronic pain syndrome: Secondary | ICD-10-CM | POA: Diagnosis not present

## 2018-10-21 ENCOUNTER — Telehealth: Payer: Self-pay

## 2018-10-21 NOTE — Telephone Encounter (Signed)
Received incoming fax from [place] requesting PA for Chantix starting month pak  Approval for 6 months thur 04/23/2019 approval # [id number] faxed pharmacy and made aware Eugenia Mcalpine, LPN

## 2018-10-24 ENCOUNTER — Other Ambulatory Visit: Payer: Self-pay | Admitting: Internal Medicine

## 2018-11-25 DIAGNOSIS — R69 Illness, unspecified: Secondary | ICD-10-CM | POA: Diagnosis not present

## 2018-11-25 DIAGNOSIS — H401112 Primary open-angle glaucoma, right eye, moderate stage: Secondary | ICD-10-CM | POA: Diagnosis not present

## 2018-11-27 DIAGNOSIS — R55 Syncope and collapse: Secondary | ICD-10-CM | POA: Diagnosis not present

## 2018-11-27 DIAGNOSIS — E869 Volume depletion, unspecified: Secondary | ICD-10-CM | POA: Diagnosis not present

## 2018-11-27 DIAGNOSIS — K529 Noninfective gastroenteritis and colitis, unspecified: Secondary | ICD-10-CM | POA: Diagnosis not present

## 2018-11-27 DIAGNOSIS — R69 Illness, unspecified: Secondary | ICD-10-CM | POA: Diagnosis not present

## 2018-11-27 DIAGNOSIS — I959 Hypotension, unspecified: Secondary | ICD-10-CM | POA: Diagnosis not present

## 2018-11-27 DIAGNOSIS — Z8719 Personal history of other diseases of the digestive system: Secondary | ICD-10-CM | POA: Diagnosis not present

## 2018-11-27 DIAGNOSIS — R197 Diarrhea, unspecified: Secondary | ICD-10-CM | POA: Diagnosis not present

## 2018-11-27 DIAGNOSIS — R911 Solitary pulmonary nodule: Secondary | ICD-10-CM | POA: Diagnosis not present

## 2018-11-27 DIAGNOSIS — Z79899 Other long term (current) drug therapy: Secondary | ICD-10-CM | POA: Diagnosis not present

## 2018-11-27 DIAGNOSIS — I10 Essential (primary) hypertension: Secondary | ICD-10-CM | POA: Diagnosis not present

## 2018-11-27 DIAGNOSIS — Z888 Allergy status to other drugs, medicaments and biological substances status: Secondary | ICD-10-CM | POA: Diagnosis not present

## 2018-11-27 DIAGNOSIS — E7800 Pure hypercholesterolemia, unspecified: Secondary | ICD-10-CM | POA: Diagnosis not present

## 2018-11-29 ENCOUNTER — Other Ambulatory Visit: Payer: Self-pay

## 2018-11-29 ENCOUNTER — Ambulatory Visit (INDEPENDENT_AMBULATORY_CARE_PROVIDER_SITE_OTHER): Payer: Medicare HMO | Admitting: Internal Medicine

## 2018-11-29 ENCOUNTER — Encounter: Payer: Self-pay | Admitting: Internal Medicine

## 2018-11-29 DIAGNOSIS — R1032 Left lower quadrant pain: Secondary | ICD-10-CM | POA: Diagnosis not present

## 2018-11-29 DIAGNOSIS — H5461 Unqualified visual loss, right eye, normal vision left eye: Secondary | ICD-10-CM

## 2018-11-29 DIAGNOSIS — H5712 Ocular pain, left eye: Secondary | ICD-10-CM | POA: Diagnosis not present

## 2018-11-29 DIAGNOSIS — I10 Essential (primary) hypertension: Secondary | ICD-10-CM | POA: Diagnosis not present

## 2018-11-29 DIAGNOSIS — Z720 Tobacco use: Secondary | ICD-10-CM | POA: Diagnosis not present

## 2018-11-29 DIAGNOSIS — H409 Unspecified glaucoma: Secondary | ICD-10-CM | POA: Diagnosis not present

## 2018-11-29 DIAGNOSIS — R51 Headache: Secondary | ICD-10-CM

## 2018-11-29 DIAGNOSIS — F11229 Opioid dependence with intoxication, unspecified: Secondary | ICD-10-CM

## 2018-11-29 DIAGNOSIS — F17200 Nicotine dependence, unspecified, uncomplicated: Secondary | ICD-10-CM

## 2018-11-29 DIAGNOSIS — Z79891 Long term (current) use of opiate analgesic: Secondary | ICD-10-CM

## 2018-11-29 DIAGNOSIS — Z79899 Other long term (current) drug therapy: Secondary | ICD-10-CM | POA: Diagnosis not present

## 2018-11-29 DIAGNOSIS — R197 Diarrhea, unspecified: Secondary | ICD-10-CM | POA: Diagnosis not present

## 2018-11-29 MED ORDER — ACETAMINOPHEN-CODEINE #3 300-30 MG PO TABS: 1.0000 | ORAL_TABLET | Freq: Three times a day (TID) | ORAL | 0 refills | Status: DC | PRN

## 2018-11-29 NOTE — Progress Notes (Signed)
   Subjective:    Patient ID: Jessica [name], female    DOB: [birth year], 78 y.o.   MRN: [id number]  HFU from [facility]  HPI  [name] is a 78 year old woman who presented with her daughter as a HFU from an ED visit to [facility].  She went due to a vasovagal episode.  Her sisters caretaker asked her to go.  At that time, she was noted to be mildly dehydrated and was released from the ED.  She notes that she is feeling better now and has not had any more episodes of vasovagal sensation.  Her BP today was on the low side, 101/59.  She denied dizziness.  Her diarrhea is resolved.  She does note some very mild LLQ pain which has been better with tylenol #3.   Headache due to eye pain - photophobia, went to eye doctor and nothing wrong - asking for tylenol 3 which she got in the ED.  She is on chronic tramadol.  She was sent to the pain clinic by my predecessor, Dr. Eppie Gibson, but she did not feel they were helping her and the co-pay was too high.  She would like to return to her chronic tramadol 25mg  BID.  She noted that this does not help with eye/head pain however and she would like to try a short course of tylenol #3.    She has been started on chantix and her smoking is decreasing.  We discussed lung cancer screening and findings of very small nodules on her lungs on previous CT scan.  She would like to think about screening, so I gave her some information to review.   Review of Systems  Constitutional: Negative for activity change and appetite change.  Eyes: Positive for photophobia, pain and visual disturbance (blind in right eye).  Respiratory: Negative for cough and shortness of breath.   Cardiovascular: Negative for chest pain and leg swelling.  Gastrointestinal: Negative for blood in stool, constipation and diarrhea.  Genitourinary: Negative for difficulty urinating.  Neurological: Positive for headaches. Negative for dizziness and light-headedness.  Psychiatric/Behavioral: Negative  for decreased concentration and dysphoric mood.       Objective:   Physical Exam Vitals signs and nursing note reviewed.  Constitutional:      General: She is not in acute distress.    Appearance: Normal appearance.  HENT:     Head: Normocephalic and atraumatic.  Cardiovascular:     Rate and Rhythm: Normal rate and regular rhythm.     Pulses: Normal pulses.     Heart sounds: No murmur.  Pulmonary:     Effort: Pulmonary effort is normal. No respiratory distress.     Breath sounds: Normal breath sounds. No wheezing.  Abdominal:     General: Abdomen is flat. There is no distension.     Tenderness: There is no abdominal tenderness.  Musculoskeletal:        General: No swelling or tenderness.     Right lower leg: No edema.     Left lower leg: No edema.  Skin:    General: Skin is warm and dry.  Neurological:     Mental Status: She is alert.  Psychiatric:        Mood and Affect: Mood normal.        Behavior: Behavior normal.     No labs today      Assessment & Plan:  RTC in 3-4 months.

## 2018-11-29 NOTE — Patient Instructions (Signed)
[name] - -  Thank you for coming in to see me today.  I think you are getting better from your dehydration and diarrhea.   For your headache, please take tylenol #3 up to three times a day for 4 days to see if this helps.    Below, you will find some information on lung cancer screening for you to review.    Please come back to see me in 3-4 months, sooner if needed!  Take Care    Lung Cancer Screening A lung cancer screening is a test that checks for lung cancer. Lung cancer screening is done to look for lung cancer in its very early stages, before it spreads and becomes harder to treat and before symptoms appear. Finding cancer early improves the chances of successful treatment. It may save your life. Should I be screened for lung cancer? You should be screened for lung cancer if all of these apply:  You currently smoke or you have quit smoking within the past 15 years.  You are 76-34 years old. Screening may be recommended up to age 41 depending on your overall health and other factors.  You are in good general health.  You have a smoking history of 1 pack a day for 30 years or 2 packs a day for 15 years. Screening may also be recommended if you are at high risk for the disease. You may be at high risk if:  You have a family history of lung cancer.  You have been exposed to asbestos.  You have chronic obstructive pulmonary disease (COPD).  You have a history of previous lung cancer. How often should I be screened for lung cancer?  If you are at risk for lung cancer, it is recommended that you are screened once a year. The recommended screening test is a low-dose CT scan. How can I lower my risk of lung cancer? To lower your risk of developing lung cancer:  If you smoke, stop smoking all tobacco products.  Avoid secondhand smoke.  Avoid exposure to radiation.  Avoid exposure to radon gas. Have your home checked for radon regularly.  Avoid things that  cause cancer (carcinogens).  Avoid living or working in places with high air pollution. Where to find more information Ask your health care provider about the risks and benefits of screening. More information and resources are available from these organizations:  [place] (ACS): www.cancer.org  American Lung Association: www.lung.org Contact a health care provider if:  You start to show symptoms of lung cancer, including: ? Coughing that will not go away. ? Wheezing. ? Chest pain. ? Coughing up blood. ? Shortness of breath. ? Weight loss that cannot be explained. ? Constant fatigue. Summary  Lung cancer screening may find lung cancer before symptoms appear. Finding cancer early improves the chances of successful treatment. It may save your life.  If you are at risk for lung cancer, it is recommended that you are screened once a year. The recommended screening test is a low-dose CT scan.  You can make lifestyle changes to lower your risk of lung cancer.  Ask your health care provider about the risks and benefits of screening. This information is not intended to replace advice given to you by your health care provider. Make sure you discuss any questions you have with your health care provider. Document Released: 01/19/2016 Document Revised: 06/21/2018 Document Reviewed: 01/19/2016 Elsevier Patient Education  2020 [employer].

## 2018-12-03 NOTE — Assessment & Plan Note (Signed)
BP was mildly low today.  She is recovering from an illness.    She is not dizzy and diarrhea is resolving.   Plan Continue current therapy, recheck BP at next visit.

## 2018-12-03 NOTE — Assessment & Plan Note (Addendum)
She has started Chantix and is decreasing her use.  We discussed lung cancer screening and she is not interested at this time, but will take information about it.  She has small nodules on a CT scan of the chest from 1 year ago.

## 2018-12-03 NOTE — Assessment & Plan Note (Signed)
She has been to pain clinic, but is not interested in going back.  She notes that she was not happy with the service and she was being asked to pay a $50 co pay.  She has had good success with tramadol and is only taking 25mg  BID.  She also noted that she has had a persistent headache related to bright lights and her glaucoma.  She feels that tylenol #3 works better for this.  We discussed how she cannot continue to take both, but to see which works better for her and we could continue one of these prescriptions going forward. .  Plan Short course of tylenol # 3 Tramadol at previous dose

## 2018-12-10 ENCOUNTER — Other Ambulatory Visit: Payer: Self-pay | Admitting: Infectious Disease

## 2018-12-12 ENCOUNTER — Other Ambulatory Visit: Payer: Self-pay | Admitting: Internal Medicine

## 2018-12-18 ENCOUNTER — Other Ambulatory Visit: Payer: Self-pay

## 2018-12-18 DIAGNOSIS — F411 Generalized anxiety disorder: Secondary | ICD-10-CM

## 2018-12-18 NOTE — Telephone Encounter (Signed)
ALPRAZolam Duanne Moron) 0.5 MG tablet, REFILL REQUEST @  [address] [place] [phone] (Phone) [phone] (Fax)

## 2018-12-19 MED ORDER — ALPRAZOLAM 0.5 MG PO TABS: 0.5000 mg | ORAL_TABLET | Freq: Every evening | ORAL | 1 refills | Status: DC | PRN

## 2018-12-30 IMAGING — DX DG CHEST 2V
2 series · 2 of 2 positions shown · non-contrast
Comparison: Chest x-ray 11/12/2017

CLINICAL DATA: Congestion and nausea for 3 weeks. Nausea and
vomiting last evening.

EXAM:
CHEST - 2 VIEW

[chest lat]
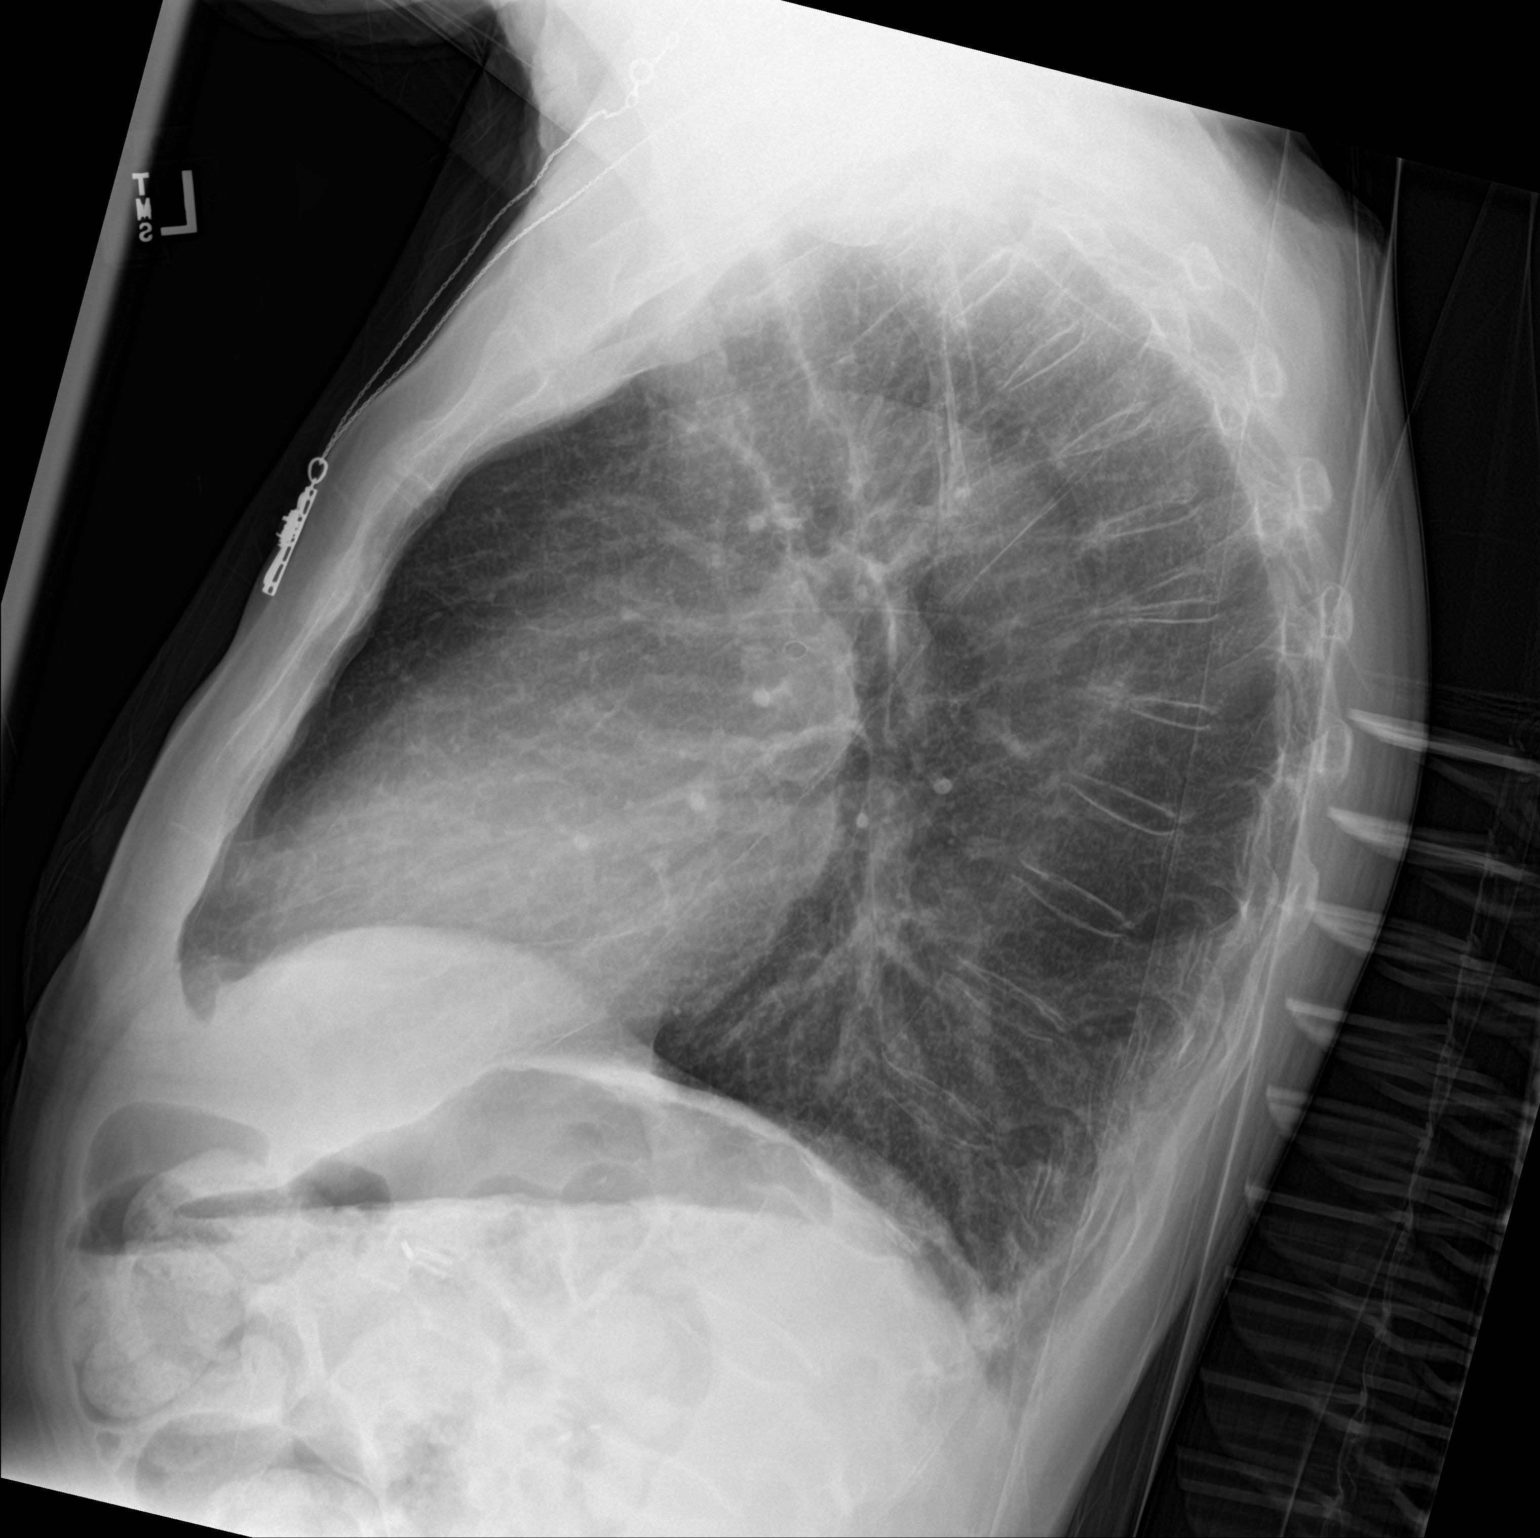

[chest ap]
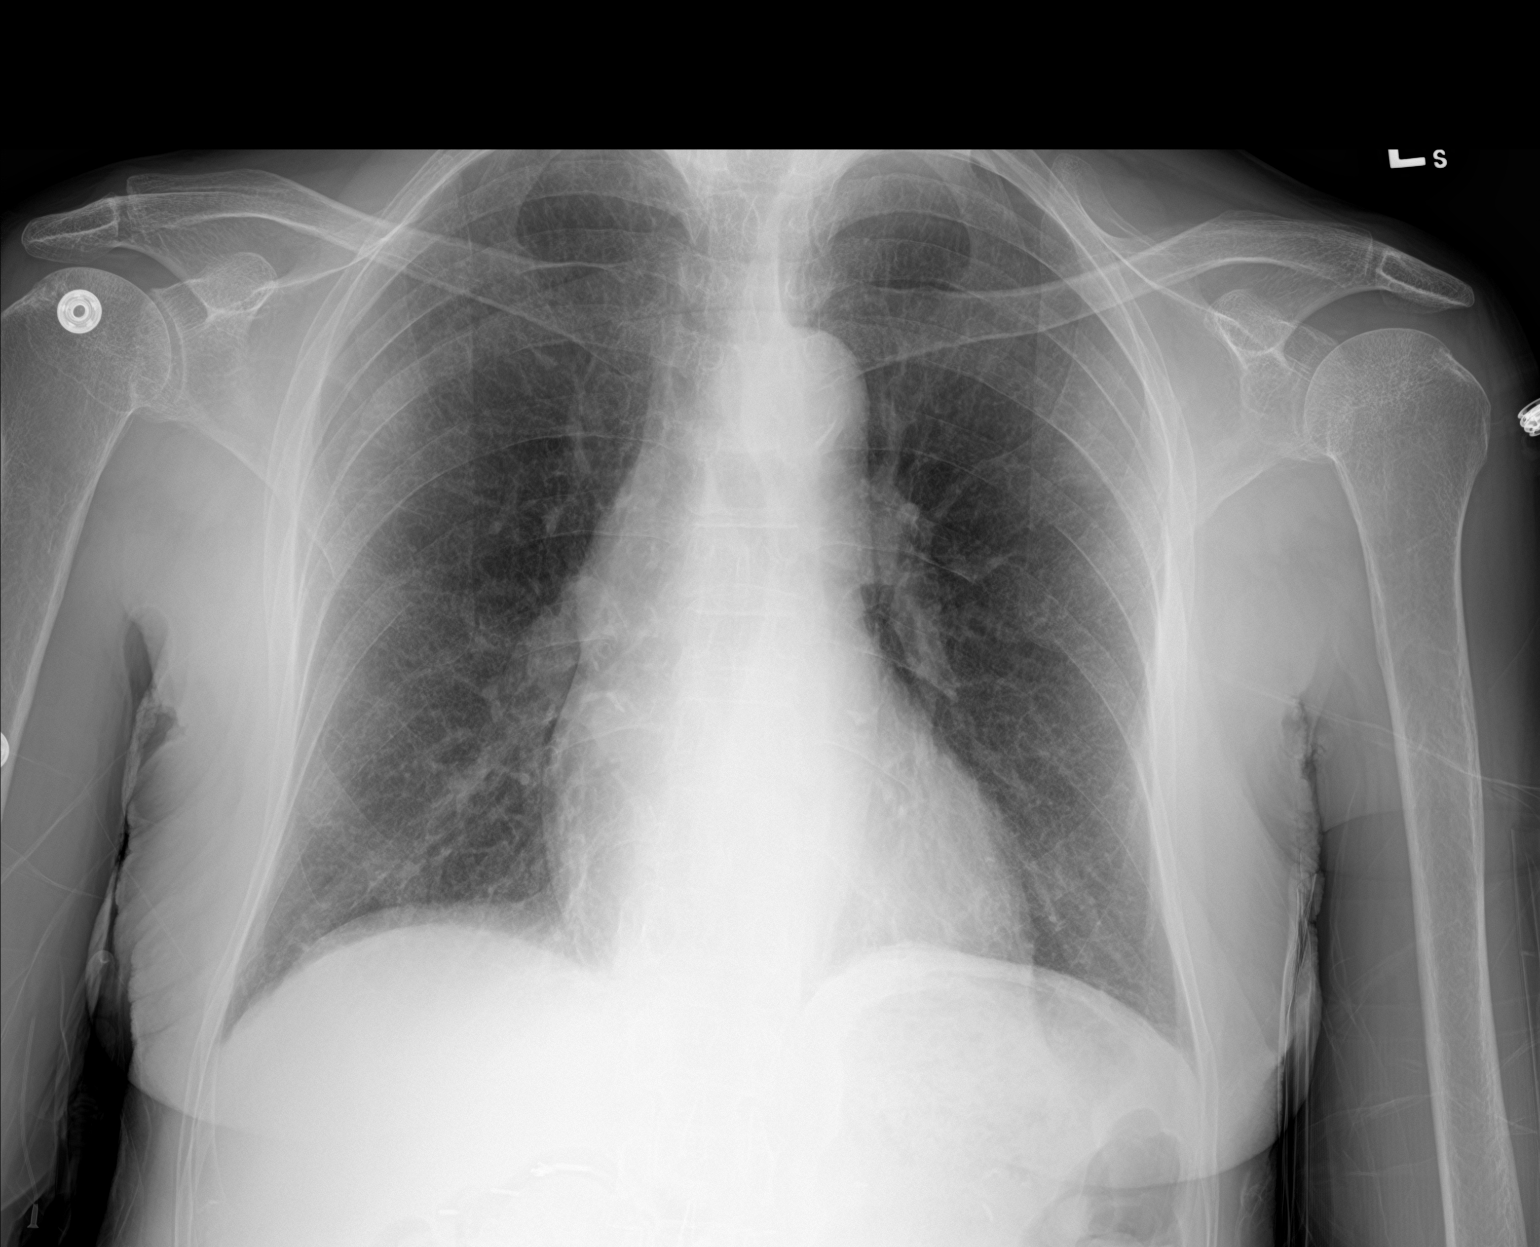

[2 of 2 positions shown; findings below may reference images not displayed]

FINDINGS: The cardiac silhouette, mediastinal and hilar contours are normal.
Mild tortuosity and calcification of the thoracic aorta. The lungs
are clear of an acute process. Mild stable emphysematous changes. No
pleural effusion. The bony thorax is intact.
IMPRESSION: No acute cardiopulmonary findings.

## 2019-02-04 ENCOUNTER — Other Ambulatory Visit: Payer: Self-pay | Admitting: Internal Medicine

## 2019-02-04 DIAGNOSIS — F411 Generalized anxiety disorder: Secondary | ICD-10-CM

## 2019-02-12 ENCOUNTER — Other Ambulatory Visit: Payer: Self-pay | Admitting: Internal Medicine

## 2019-02-12 DIAGNOSIS — M51369 Other intervertebral disc degeneration, lumbar region without mention of lumbar back pain or lower extremity pain: Secondary | ICD-10-CM

## 2019-02-12 DIAGNOSIS — M5136 Other intervertebral disc degeneration, lumbar region: Secondary | ICD-10-CM

## 2019-02-12 NOTE — Telephone Encounter (Signed)
Received refill request for tramadol-medication no longer on list.  Per 08/28/18 visit with pcp, tramadol was stopped as pt is now seeing a pain specialist. Will send to pcp for review and to deny or approve as appropriate.  Please advise.Marland KitchenMarland KitchenDespina Hidden Cassady12/2/20209:32 AM

## 2019-02-21 ENCOUNTER — Other Ambulatory Visit: Payer: Self-pay

## 2019-02-21 ENCOUNTER — Ambulatory Visit (HOSPITAL_COMMUNITY)
Admission: RE | Admit: 2019-02-21 | Discharge: 2019-02-21 | Disposition: A | Discharge status: Home / Self Care | Payer: Medicare HMO | Source: Ambulatory Visit | Attending: Internal Medicine | Admitting: Internal Medicine

## 2019-02-21 ENCOUNTER — Ambulatory Visit (INDEPENDENT_AMBULATORY_CARE_PROVIDER_SITE_OTHER): Payer: Medicare HMO | Admitting: Internal Medicine

## 2019-02-21 ENCOUNTER — Encounter: Payer: Self-pay | Admitting: Internal Medicine

## 2019-02-21 VITALS — BP 111/54 | HR 82 | Temp 98.2°F | Ht 67.0 in | Wt 126.8 lb

## 2019-02-21 DIAGNOSIS — H409 Unspecified glaucoma: Secondary | ICD-10-CM

## 2019-02-21 DIAGNOSIS — I739 Peripheral vascular disease, unspecified: Secondary | ICD-10-CM

## 2019-02-21 DIAGNOSIS — M810 Age-related osteoporosis without current pathological fracture: Secondary | ICD-10-CM | POA: Insufficient documentation

## 2019-02-21 DIAGNOSIS — G8929 Other chronic pain: Secondary | ICD-10-CM | POA: Diagnosis not present

## 2019-02-21 DIAGNOSIS — M5136 Other intervertebral disc degeneration, lumbar region: Secondary | ICD-10-CM

## 2019-02-21 DIAGNOSIS — M25559 Pain in unspecified hip: Secondary | ICD-10-CM

## 2019-02-21 DIAGNOSIS — Z720 Tobacco use: Secondary | ICD-10-CM

## 2019-02-21 DIAGNOSIS — B009 Herpesviral infection, unspecified: Secondary | ICD-10-CM | POA: Diagnosis not present

## 2019-02-21 DIAGNOSIS — Z90710 Acquired absence of both cervix and uterus: Secondary | ICD-10-CM

## 2019-02-21 DIAGNOSIS — J329 Chronic sinusitis, unspecified: Secondary | ICD-10-CM

## 2019-02-21 DIAGNOSIS — R269 Unspecified abnormalities of gait and mobility: Secondary | ICD-10-CM

## 2019-02-21 DIAGNOSIS — F1290 Cannabis use, unspecified, uncomplicated: Secondary | ICD-10-CM

## 2019-02-21 DIAGNOSIS — Z8719 Personal history of other diseases of the digestive system: Secondary | ICD-10-CM

## 2019-02-21 DIAGNOSIS — I10 Essential (primary) hypertension: Secondary | ICD-10-CM | POA: Diagnosis not present

## 2019-02-21 DIAGNOSIS — M51369 Other intervertebral disc degeneration, lumbar region without mention of lumbar back pain or lower extremity pain: Secondary | ICD-10-CM

## 2019-02-21 DIAGNOSIS — M25552 Pain in left hip: Secondary | ICD-10-CM | POA: Diagnosis not present

## 2019-02-21 DIAGNOSIS — R519 Headache, unspecified: Secondary | ICD-10-CM

## 2019-02-21 DIAGNOSIS — F17200 Nicotine dependence, unspecified, uncomplicated: Secondary | ICD-10-CM

## 2019-02-21 DIAGNOSIS — R69 Illness, unspecified: Secondary | ICD-10-CM | POA: Diagnosis not present

## 2019-02-21 DIAGNOSIS — Z79899 Other long term (current) drug therapy: Secondary | ICD-10-CM

## 2019-02-21 DIAGNOSIS — Z9181 History of falling: Secondary | ICD-10-CM

## 2019-02-21 DIAGNOSIS — Z8742 Personal history of other diseases of the female genital tract: Secondary | ICD-10-CM

## 2019-02-21 DIAGNOSIS — Z79891 Long term (current) use of opiate analgesic: Secondary | ICD-10-CM

## 2019-02-21 DIAGNOSIS — Z21 Asymptomatic human immunodeficiency virus [HIV] infection status: Secondary | ICD-10-CM

## 2019-02-21 DIAGNOSIS — S79912A Unspecified injury of left hip, initial encounter: Secondary | ICD-10-CM | POA: Diagnosis not present

## 2019-02-21 DIAGNOSIS — R21 Rash and other nonspecific skin eruption: Secondary | ICD-10-CM

## 2019-02-21 MED ORDER — TRIAMCINOLONE ACETONIDE 0.1 % EX OINT: TOPICAL_OINTMENT | CUTANEOUS | 3 refills | Status: DC

## 2019-02-21 MED ORDER — VARENICLINE TARTRATE 0.5 MG X 11 & 1 MG X 42 PO MISC: ORAL | 0 refills | Status: DC

## 2019-02-21 NOTE — Assessment & Plan Note (Signed)
She would like to start back on Chantix.   Starter pack sent to the pharmacy.

## 2019-02-21 NOTE — Assessment & Plan Note (Signed)
BP is well controlled today.  She will continue amlodipine, benazepril, terazosin.

## 2019-02-21 NOTE — Assessment & Plan Note (Signed)
She is not having any issues with constipation at this time and is on a low dose of tramadol.

## 2019-02-21 NOTE — Assessment & Plan Note (Signed)
Continue PRN tramadol at this time.  Hip xray as noted above.

## 2019-02-21 NOTE — Assessment & Plan Note (Signed)
Discussed with patient.  She had a hysterectomy partial in 1971 and then total in 1977.  She had an abnormal pap in 2008, but since then have been normal.  Reports no procedures related to this.  She is amenable to having a pap smear done at next visit for continued screening.  She reported no vaginal discharge or bleeding.

## 2019-02-21 NOTE — Assessment & Plan Note (Signed)
She reports having no issues with her sinuses except some dryness.  We discussed trying flonase OTC
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
## 2019-02-21 NOTE — Assessment & Plan Note (Signed)
Given her history and recent fall, will plan for her to have a hip xray today.  If she does have a fracture, it is likely healing at this time.  Will plan for treatment plan once xray is back.

## 2019-02-21 NOTE — Patient Instructions (Signed)
[name] - -  Please get a hip xray today.  I think you likely have a healing bruise and this should continue to get better.  However, given you have osteoporosis, would recommend an xray today.   Please schedule for a Mammogram.   Please restart the Chantix for smoking cessation.    Thank you!

## 2019-02-24 ENCOUNTER — Other Ambulatory Visit: Payer: Self-pay | Admitting: Infectious Disease

## 2019-02-28 ENCOUNTER — Ambulatory Visit: Payer: Medicare HMO | Admitting: Internal Medicine

## 2019-03-16 ENCOUNTER — Other Ambulatory Visit: Payer: Self-pay | Admitting: Internal Medicine

## 2019-03-21 ENCOUNTER — Ambulatory Visit: Payer: Medicare HMO | Admitting: Internal Medicine

## 2019-03-21 ENCOUNTER — Other Ambulatory Visit: Payer: Self-pay

## 2019-03-21 ENCOUNTER — Other Ambulatory Visit: Payer: Self-pay | Admitting: Infectious Disease

## 2019-03-21 DIAGNOSIS — B20 Human immunodeficiency virus [HIV] disease: Secondary | ICD-10-CM

## 2019-03-21 DIAGNOSIS — K5900 Constipation, unspecified: Secondary | ICD-10-CM

## 2019-03-21 NOTE — Progress Notes (Signed)
Could not reach patient.   Cell twice - no answer, no identifiable answering machine.   Home phone - disconnected.

## 2019-03-25 ENCOUNTER — Other Ambulatory Visit: Payer: Self-pay | Admitting: Infectious Disease

## 2019-03-25 ENCOUNTER — Other Ambulatory Visit: Payer: Self-pay | Admitting: Internal Medicine

## 2019-03-25 DIAGNOSIS — Z1231 Encounter for screening mammogram for malignant neoplasm of breast: Secondary | ICD-10-CM

## 2019-03-25 DIAGNOSIS — E785 Hyperlipidemia, unspecified: Secondary | ICD-10-CM

## 2019-03-26 NOTE — Addendum Note (Signed)
Addended by: Dolan Amen D on: 03/26/2019 11:17 AM   Modules accepted: Orders

## 2019-03-26 NOTE — Addendum Note (Signed)
Addended by: Dolan Amen D on: 03/26/2019 11:16 AM   Modules accepted: Orders

## 2019-03-27 ENCOUNTER — Other Ambulatory Visit: Payer: Medicare HMO

## 2019-03-27 ENCOUNTER — Other Ambulatory Visit: Payer: Self-pay

## 2019-03-27 DIAGNOSIS — E7800 Pure hypercholesterolemia, unspecified: Secondary | ICD-10-CM

## 2019-03-27 DIAGNOSIS — B20 Human immunodeficiency virus [HIV] disease: Secondary | ICD-10-CM

## 2019-03-27 DIAGNOSIS — I10 Essential (primary) hypertension: Secondary | ICD-10-CM | POA: Diagnosis not present

## 2019-03-27 DIAGNOSIS — R69 Illness, unspecified: Secondary | ICD-10-CM | POA: Diagnosis not present

## 2019-03-27 DIAGNOSIS — F17200 Nicotine dependence, unspecified, uncomplicated: Secondary | ICD-10-CM

## 2019-03-28 LAB — T-HELPER CELL (CD4) - (RCID CLINIC ONLY)
CD4 % Helper T Cell: 44 % (ref 33–65)
CD4 T Cell Abs: 2007 /uL — ABNORMAL HIGH (ref 400–1790)

## 2019-03-31 DIAGNOSIS — H401112 Primary open-angle glaucoma, right eye, moderate stage: Secondary | ICD-10-CM | POA: Diagnosis not present

## 2019-04-02 ENCOUNTER — Other Ambulatory Visit: Payer: Self-pay

## 2019-04-02 DIAGNOSIS — I10 Essential (primary) hypertension: Secondary | ICD-10-CM

## 2019-04-02 LAB — COMPLETE METABOLIC PANEL WITH GFR
AG Ratio: 1.5 (calc) (ref 1.0–2.5)
ALT: 6 U/L (ref 6–29)
AST: 14 U/L (ref 10–35)
Albumin: 4.1 g/dL (ref 3.6–5.1)
Alkaline phosphatase (APISO): 55 U/L (ref 37–153)
BUN: 16 mg/dL (ref 7–25)
CO2: 22 mmol/L (ref 20–32)
Calcium: 9.9 mg/dL (ref 8.6–10.4)
Chloride: 112 mmol/L — ABNORMAL HIGH (ref 98–110)
Creat: 0.85 mg/dL (ref 0.60–0.93)
GFR, Est African American: 77 mL/min/{1.73_m2} (ref >=60)
GFR, Est Non African American: 67 mL/min/{1.73_m2} (ref >=60)
Globulin: 2.7 g/dL (calc) (ref 1.9–3.7)
Glucose, Bld: 172 mg/dL — ABNORMAL HIGH (ref 65–99)
Potassium: 3.7 mmol/L (ref 3.5–5.3)
Sodium: 141 mmol/L (ref 135–146)
Total Bilirubin: 0.5 mg/dL (ref 0.2–1.2)
Total Protein: 6.8 g/dL (ref 6.1–8.1)

## 2019-04-02 LAB — CBC WITH DIFFERENTIAL/PLATELET
Absolute Monocytes: 389 cells/uL (ref 200–950)
Basophils Absolute: 73 cells/uL (ref 0–200)
Basophils Relative: 0.9 %
Eosinophils Absolute: 57 cells/uL (ref 15–500)
Eosinophils Relative: 0.7 %
HCT: 39.8 % (ref 35.0–45.0)
Hemoglobin: 13.3 g/dL (ref 11.7–15.5)
Lymphs Abs: 4358 cells/uL — ABNORMAL HIGH (ref 850–3900)
MCH: 32.7 pg (ref 27.0–33.0)
MCHC: 33.4 g/dL (ref 32.0–36.0)
MCV: 97.8 fL (ref 80.0–100.0)
MPV: 9.2 fL (ref 7.5–12.5)
Monocytes Relative: 4.8 %
Neutro Abs: 3224 cells/uL (ref 1500–7800)
Neutrophils Relative %: 39.8 %
Platelets: 299 10*3/uL (ref 140–400)
RBC: 4.07 10*6/uL (ref 3.80–5.10)
RDW: 11.9 % (ref 11.0–15.0)
Total Lymphocyte: 53.8 %
WBC: 8.1 10*3/uL (ref 3.8–10.8)

## 2019-04-02 LAB — HIV-1 RNA QUANT-NO REFLEX-BLD
HIV 1 RNA Quant: <20 copies/mL
HIV-1 RNA Quant, Log: <1.3 Log copies/mL

## 2019-04-02 LAB — LIPID PANEL
Cholesterol: 148 mg/dL (ref <200)
HDL: 62 mg/dL (ref >=50)
LDL Cholesterol (Calc): 66 mg/dL (calc)
Non-HDL Cholesterol (Calc): 86 mg/dL (calc) (ref <130)
Total CHOL/HDL Ratio: 2.4 (calc) (ref <5.0)
Triglycerides: 114 mg/dL (ref <150)

## 2019-04-02 LAB — RPR: RPR Ser Ql: NONREACTIVE

## 2019-04-03 ENCOUNTER — Telehealth: Payer: Self-pay | Admitting: *Deleted

## 2019-04-03 MED ORDER — DICLOFENAC SODIUM 1 % EX GEL: 2.0000 g | Freq: Four times a day (QID) | CUTANEOUS | 3 refills | Status: DC

## 2019-04-03 MED ORDER — AMLODIPINE BESYLATE 10 MG PO TABS: 10.0000 mg | ORAL_TABLET | Freq: Every day | ORAL | 3 refills | Status: DC

## 2019-04-03 NOTE — Telephone Encounter (Signed)
Information was sent through CoverMyMeds for PA for Diclofenac Gel 1%.  Approved 03/14/2019 through 03/12/2020.  Coverage will continue as long as patient has Medicare Part D Plan.  Sander Nephew, RN.  04/03/2019 3:18 PM.

## 2019-04-06 ENCOUNTER — Other Ambulatory Visit: Payer: Self-pay | Admitting: Internal Medicine

## 2019-04-06 DIAGNOSIS — F411 Generalized anxiety disorder: Secondary | ICD-10-CM

## 2019-04-07 NOTE — Telephone Encounter (Signed)
This was just refilled on 1/4.  She has 1 refill remaining.  Can you call her to check and see if she actually needs a refill?  If she is overtaking her xanax, she will need to be seen, telehealth or in person.   Thanks.

## 2019-04-07 NOTE — Telephone Encounter (Signed)
Called pt - no answer. According to med list, Xanax was refilled on 02/04/19 x 1 RF. But Terazosin was refilled on 03/17/19. Thanks

## 2019-04-08 ENCOUNTER — Encounter: Payer: Self-pay | Admitting: Infectious Disease

## 2019-04-08 ENCOUNTER — Other Ambulatory Visit: Payer: Self-pay

## 2019-04-08 ENCOUNTER — Ambulatory Visit (INDEPENDENT_AMBULATORY_CARE_PROVIDER_SITE_OTHER): Payer: Medicare HMO | Admitting: Infectious Disease

## 2019-04-08 ENCOUNTER — Ambulatory Visit: Payer: Medicare HMO

## 2019-04-08 VITALS — BP 126/75 | HR 69 | Temp 97.9°F | Wt 130.0 lb

## 2019-04-08 DIAGNOSIS — B20 Human immunodeficiency virus [HIV] disease: Secondary | ICD-10-CM

## 2019-04-08 DIAGNOSIS — R69 Illness, unspecified: Secondary | ICD-10-CM | POA: Diagnosis not present

## 2019-04-08 DIAGNOSIS — K5903 Drug induced constipation: Secondary | ICD-10-CM | POA: Diagnosis not present

## 2019-04-08 DIAGNOSIS — K5730 Diverticulosis of large intestine without perforation or abscess without bleeding: Secondary | ICD-10-CM

## 2019-04-08 DIAGNOSIS — R195 Other fecal abnormalities: Secondary | ICD-10-CM | POA: Diagnosis not present

## 2019-04-08 DIAGNOSIS — I10 Essential (primary) hypertension: Secondary | ICD-10-CM | POA: Diagnosis not present

## 2019-04-08 DIAGNOSIS — R112 Nausea with vomiting, unspecified: Secondary | ICD-10-CM

## 2019-04-08 DIAGNOSIS — I739 Peripheral vascular disease, unspecified: Secondary | ICD-10-CM

## 2019-04-08 DIAGNOSIS — F411 Generalized anxiety disorder: Secondary | ICD-10-CM

## 2019-04-08 HISTORY — DX: Other fecal abnormalities: R19.5

## 2019-04-08 HISTORY — DX: Nausea with vomiting, unspecified: R11.2

## 2019-04-08 NOTE — Progress Notes (Signed)
Chief complaint: Follow-up for HIV on medication, again concerned about problems with constipation loose stools and nausea and apparent recent syncopal events  Subjective:    Patient ID: Jessica [name], female    DOB: 1941-01-12, 79 y.o.   MRN: [id number]  HPI  Kripa Pullium is a 79 y.o. female with HIV infection who has done superbly well on her antiviral regimen, changed from Atripla to [facility] and DESCOVY -->BIKTARVY with reasonable virological suppression.  She does have a longstanding history of having been on opiates for many decades.  She has been suffering from problems with constipation and then diarrhea at times nausea that has been going on for months to years.  She tells me that she had at least 1 syncopal event after she had something that "Dr Orene Desanctis called vasovagal" which I believe was due to her straining while having a bowel movement.  She is requested referral to gastroenterology and I will make this referral.  She also at the end of the visit asked for change in her anxiolytic but this is being prescribed by primary care.  She is renewing her SPAP program.      Past Medical History:  Diagnosis Date  . Acute renal failure (ARF) ([place]) 08/17/2017  . Anxiety 04/05/2012  . Bilateral sensorineural hearing loss 06/11/2014   Mild to moderate on the left side and slight to mild on the right side per audiometry 05/2014.  Hearing aides with possible masking of tinnitus recommended but patient wished to defer secondary to finances.  . Blood transfusion without reported diagnosis    pt denies  . Bursitis of right shoulder 07/12/2012   s/p shoulder injection 07/12/2012   . Cataract of right eye   . Constipation due to pain medication 04/27/2010  . Diverticulosis 02/08/2012   Extensive left-sided diverticula on colonoscopy March 2012 per Dr. Gala Romney   . Essential hypertension 07/20/2006  . Genital herpes 07/20/2006  . 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 Heart murmur 1961  .  Human immunodeficiency virus disease ([place]) 03/27/1986  . Hyperlipidemia LDL goal < 100 04/05/2012  . Hypokalemia 04/12/2018  . Long-term current use of opiate analgesic 03/17/2016  . Lumbar degenerative disc disease 07/20/2006   With chronic back pain   . Marijuana use 07/03/2016  . Micturition syncope 09/20/2015  . Opiate dependence ([place]) 04/12/2018  . 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[place]) 11/01/2011   s/p aortobifem bypass 2009   . Periumbilical hernia 123XX123   1 cm left periumbilical abdominal wall defect  . 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 Right rotator cuff tear 02/01/2013   Responds to periodic steroid injections  . Seborrhea 09/01/2010  . Shoulder pain, right 12/04/2017  . Small bowel obstruction due to adhesions ([place]) 02/08/2012   s/p Exploratory laparotomy, lysis of adhesions 02/12/12    . 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 Tobacco abuse 02/19/2012  . Tobacco abuse   . Vasovagal syncope 02/15/2015  . Vitamin D deficiency 05/29/2018   Vitamin D 18.96 (04/30/2018), treated with ergocalciferol 50,000 units PO QWk X 4 weeks  . Voiding dysfunction    s/p cystoscopy and meatal dilation Dec 2005    Past Surgical History:  Procedure Laterality Date  . ABDOMINAL HYSTERECTOMY    . AORTO-FEMORAL BYPASS GRAFT  04/2007  . APPENDECTOMY    . BREAST SURGERY     Breast biopsy: negative  . CHOLECYSTECTOMY    .  COLECTOMY  01/2011   Dr. Margot Chimes; "took out 12 inches of small intestiines and removed blockage"  . EYE SURGERY    . LAPAROTOMY  02/12/2012   Procedure: EXPLORATORY LAPAROTOMY;  Surgeon: Stark Klein, MD;  Location: [facility] OR;  Service: General;  Laterality: N/A;  Exploratory Laparotomy, lysis of adhesions  . SMALL INTESTINE SURGERY      Family History  Problem Relation Age of Onset  . Kidney failure Mother   . Diabetes Mother   . Hypertension Mother   . Heart disease Mother   . Glaucoma Father   . Congestive  Heart Failure Sister   . Diabetes Sister   . Kidney disease Sister   . Diabetes Brother   . Unexplained death Brother 63       Automobile accident  . Hypothyroidism Daughter   . Arthritis Daughter        Neck/Back  . Healthy Son   . HIV/AIDS Brother   . HIV Daughter   . Kidney disease Daughter   . Arthritis Son        Knee      Social History   Socioeconomic History  . Marital status: Widowed    Spouse name: Not on file  . Number of children: 4  . Years of education: 2y college  . Highest education level: Not on file  Occupational History  . 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 Smoking status: Current Every Day Smoker    Packs/day: 1.00    Years: 50.00    Pack years: 50.00    Types: Cigarettes  . Smokeless tobacco: Never Used  Substance and Sexual Activity  . Alcohol use: No    Alcohol/week: 0.0 standard drinks    Comment: "last drink of alcohol ~ 1977"  . Drug use: No    Comment: cutting back on chantix, 1 cigarette this morning  . Sexual activity: Never  Other Topics Concern  . Not on file  Social History Narrative   Lives alone in [place], [place]   Social Determinants of Health   Financial Resource Strain:   . Difficulty of Paying Living Expenses: Not on file  Food Insecurity:   . Worried About Charity fundraiser in the Last Year: Not on file  . Ran Out of Food in the Last Year: Not on file  Transportation Needs:   . Lack of Transportation (Medical): Not on file  . Lack of Transportation (Non-Medical): Not on file  Physical Activity:   . Days of Exercise per Week: Not on file  . Minutes of Exercise per Session: Not on file  Stress:   . Feeling of Stress : Not on file  Social Connections:   . Frequency of Communication with Friends and Family: Not on file  . Frequency of Social Gatherings with Friends and Family: Not on file  . Attends Religious Services: Not on file  . Active Member of Clubs or  Organizations: Not on file  . Attends Archivist Meetings: Not on file  . Marital Status: Not on file    Allergies  Allergen Reactions  . Hctz [Hydrochlorothiazide] Other (See Comments)    Dizziness, syncope; does NOT wish to take anymore     Current Outpatient Medications:  .  alendronate (FOSAMAX) 70 MG tablet, Take 1 tablet (70 mg total) by mouth once a week., Disp: 12 tablet, Rfl: 3 .  ALPRAZolam (XANAX) 0.5 MG tablet,  TAKE 1 TABLET BY MOUTH AT BEDTIME AS NEEDED FOR ANXIETY, Disp: 30 tablet, Rfl: 1 .  amLODipine (NORVASC) 10 MG tablet, Take 1 tablet (10 mg total) by mouth daily., Disp: 90 tablet, Rfl: 3 .  aspirin 81 MG EC tablet, Take 81 mg by mouth daily.  , Disp: , Rfl:  .  atorvastatin (LIPITOR) 10 MG tablet, TAKE 1 TABLET BY MOUTH EVERY DAY (STOP LOVASTATIN), Disp: 30 tablet, Rfl: 5 .  benazepril (LOTENSIN) 40 MG tablet, Take 1 tablet (40 mg total) by mouth daily., Disp: 90 tablet, Rfl: 3 .  BIKTARVY 50-200-25 MG TABS tablet, TAKE 1 TABLET BY MOUTH DAILY, Disp: 30 tablet, Rfl: 1 .  CHANTIX CONTINUING MONTH PAK 1 MG tablet, TAKE 1 TABLET BY MOUTH TWICE DAILY( EVERY TWELVE HOURS), Disp: 56 tablet, Rfl: 2 .  COMBIGAN 0.2-0.5 % ophthalmic solution, Place 1 drop into the left eye 2 (two) times daily. , Disp: , Rfl:  .  diclofenac Sodium (VOLTAREN) 1 % GEL, Apply 2 g topically 4 (four) times daily., Disp: 100 g, Rfl: 3 .  docusate sodium (COLACE) 100 MG capsule, Take 100 mg by mouth 2 (two) times daily., Disp: , Rfl:  .  dorzolamide (TRUSOPT) 2 % ophthalmic solution, Place 1 drop into both eyes 2 (two) times daily., Disp: , Rfl: 5 .  latanoprost (XALATAN) 0.005 % ophthalmic solution, Place 1 drop into both eyes at bedtime., Disp: , Rfl:  .  Multiple Vitamin (MULTIVITAMIN WITH MINERALS) TABS, Take 1 tablet by mouth daily., Disp: , Rfl:  .  terazosin (HYTRIN) 5 MG capsule, TAKE ONE CAPSULE BY MOUTH AT BEDTIME, Disp: 30 capsule, Rfl: 3 .  traMADol (ULTRAM) 50 MG tablet, TAKE TWO  TABLETS BY MOUTH EVERY 8 HOURS AS NEEDED FOR SEVERE PAIN, Disp: 180 tablet, Rfl: 5 .  triamcinolone ointment (KENALOG) 0.1 %, APPLY TO AFFECTED AREA TWICE DAILY, Disp: 454 g, Rfl: 3 .  varenicline (CHANTIX PAK) 0.5 MG X 11 & 1 MG X 42 tablet, Take one 0.5 mg tablet by mouth once daily for 3 days, then increase to one 0.5 mg tablet twice daily for 4 days, then increase to one 1 mg tablet twice daily., Disp: 53 tablet, Rfl: 0        Review of Systems  Constitutional: Negative for activity change, appetite change, chills, diaphoresis, fatigue and unexpected weight change.  HENT: Negative for congestion, ear pain, rhinorrhea, sinus pressure, sneezing, sore throat and trouble swallowing.   Eyes: Negative for photophobia and visual disturbance.  Respiratory: Negative for cough, chest tightness, shortness of breath, wheezing and stridor.   Cardiovascular: Negative for palpitations.  Gastrointestinal: Positive for abdominal pain, diarrhea, nausea and vomiting. Negative for abdominal distention, anal bleeding, blood in stool and constipation.  Genitourinary: Negative for difficulty urinating, flank pain and hematuria.  Musculoskeletal: Negative for gait problem and joint swelling.  Skin: Negative for color change, pallor and wound.  Neurological: Negative for dizziness, tremors and light-headedness.  Hematological: Negative for adenopathy. Does not bruise/bleed easily.  Psychiatric/Behavioral: Negative for agitation, behavioral problems, confusion, decreased concentration, dysphoric mood and sleep disturbance.        Objective:   Physical Exam  Constitutional: She is oriented to person, place, and time. She appears well-developed and well-nourished. No distress.  HENT:  Head: Normocephalic and atraumatic.  Mouth/Throat: No oropharyngeal exudate.  Eyes: Conjunctivae and EOM are normal. No scleral icterus.  Cardiovascular: Normal rate and regular rhythm.  Pulmonary/Chest: Effort normal. No  respiratory distress. She has no  wheezes.  Abdominal: She exhibits no distension.  Musculoskeletal:        General: No edema.     Cervical back: Normal range of motion and neck supple.  Neurological: She is alert and oriented to person, place, and time. She exhibits normal muscle tone. Coordination normal.  Skin: Skin is warm and dry. No rash noted. She is not diaphoretic. No erythema. No pallor.  Psychiatric: Her speech is normal and behavior is normal. Judgment and thought content normal. Her mood appears anxious. She exhibits a depressed mood.  Nursing note and vitals reviewed.         Assessment & Plan:     HIV: Continue BIKTARVY  Renewed her SPAP, she can return to clinic in July  Constipation nausea diarrhea potential dehydration vomiting: will refer to GI per patients request  Depression and anxiety: This is managed by PCP  Chronic pain no longer on opiates but on tramadol  HTN being managed by PCP  Hyperlipidemia: Continue Lipitor

## 2019-04-09 ENCOUNTER — Encounter: Payer: Self-pay | Admitting: Infectious Disease

## 2019-04-23 ENCOUNTER — Encounter: Payer: Self-pay | Admitting: Gastroenterology

## 2019-05-06 ENCOUNTER — Ambulatory Visit
Admission: RE | Admit: 2019-05-06 | Discharge: 2019-05-06 | Disposition: A | Discharge status: Home / Self Care | Payer: Medicare HMO | Source: Ambulatory Visit | Attending: Internal Medicine | Admitting: Internal Medicine

## 2019-05-06 ENCOUNTER — Telehealth: Payer: Self-pay | Admitting: Internal Medicine

## 2019-05-06 ENCOUNTER — Other Ambulatory Visit: Payer: Self-pay

## 2019-05-06 DIAGNOSIS — R001 Bradycardia, unspecified: Secondary | ICD-10-CM | POA: Diagnosis not present

## 2019-05-06 DIAGNOSIS — R112 Nausea with vomiting, unspecified: Secondary | ICD-10-CM | POA: Diagnosis not present

## 2019-05-06 DIAGNOSIS — I10 Essential (primary) hypertension: Secondary | ICD-10-CM | POA: Diagnosis not present

## 2019-05-06 DIAGNOSIS — E869 Volume depletion, unspecified: Secondary | ICD-10-CM | POA: Diagnosis not present

## 2019-05-06 DIAGNOSIS — R1110 Vomiting, unspecified: Secondary | ICD-10-CM | POA: Diagnosis not present

## 2019-05-06 DIAGNOSIS — R69 Illness, unspecified: Secondary | ICD-10-CM | POA: Diagnosis not present

## 2019-05-06 DIAGNOSIS — Z79899 Other long term (current) drug therapy: Secondary | ICD-10-CM | POA: Diagnosis not present

## 2019-05-06 DIAGNOSIS — Z1231 Encounter for screening mammogram for malignant neoplasm of breast: Secondary | ICD-10-CM | POA: Diagnosis not present

## 2019-05-06 DIAGNOSIS — R197 Diarrhea, unspecified: Secondary | ICD-10-CM | POA: Diagnosis not present

## 2019-05-06 DIAGNOSIS — K529 Noninfective gastroenteritis and colitis, unspecified: Secondary | ICD-10-CM | POA: Diagnosis not present

## 2019-05-06 DIAGNOSIS — R531 Weakness: Secondary | ICD-10-CM | POA: Diagnosis not present

## 2019-05-06 DIAGNOSIS — E7800 Pure hypercholesterolemia, unspecified: Secondary | ICD-10-CM | POA: Diagnosis not present

## 2019-05-06 NOTE — Telephone Encounter (Signed)
Talked to pt's daughter, [name], to schedule a telehealth appt. Telehealth appt schedule tomorrow in [facility] @ 0915 AM. Unless Dr Daryll Drown you prefer an appt with you ; let me know. Thanks

## 2019-05-06 NOTE — Telephone Encounter (Signed)
Sounds like they really need some assistance, so [facility] is appropriate!  Thank you!

## 2019-05-06 NOTE — Telephone Encounter (Signed)
Pls contact pt daughter regarding medicine [phone]

## 2019-05-06 NOTE — Telephone Encounter (Signed)
Pt's daughter calls and states pt is still not sleeping, they are both tired, daughter stays up with pt to make sure she remains safe. She states they had discussed this with dr Daryll Drown and need this resolved. They are at the breast ctr this am, they have a GI appt 3/15 and pt has had hip rad done Please advise or rtc to daughter

## 2019-05-06 NOTE — Telephone Encounter (Signed)
CAn you please set her up for a telehealth visit?  I do not have any documentation that we discussed.  Need further information.  Thanks!

## 2019-05-07 ENCOUNTER — Ambulatory Visit: Payer: Medicare HMO | Admitting: Radiation Oncology

## 2019-05-07 DIAGNOSIS — G4700 Insomnia, unspecified: Secondary | ICD-10-CM

## 2019-05-07 NOTE — Progress Notes (Signed)
Attempted to reach patient on home and cell phone. Called both phone numbers twice. Called cell a third time and left voicemail for patient to return call.

## 2019-05-08 DIAGNOSIS — Z23 Encounter for immunization: Secondary | ICD-10-CM | POA: Diagnosis not present

## 2019-05-14 ENCOUNTER — Other Ambulatory Visit: Payer: Self-pay | Admitting: Internal Medicine

## 2019-05-14 DIAGNOSIS — F411 Generalized anxiety disorder: Secondary | ICD-10-CM

## 2019-05-15 ENCOUNTER — Other Ambulatory Visit: Payer: Self-pay | Admitting: Infectious Disease

## 2019-05-15 DIAGNOSIS — B20 Human immunodeficiency virus [HIV] disease: Secondary | ICD-10-CM

## 2019-05-26 ENCOUNTER — Ambulatory Visit (INDEPENDENT_AMBULATORY_CARE_PROVIDER_SITE_OTHER): Payer: Medicare HMO | Admitting: Gastroenterology

## 2019-05-26 ENCOUNTER — Encounter: Payer: Self-pay | Admitting: Gastroenterology

## 2019-05-26 VITALS — BP 144/58 | HR 70 | Temp 98.0°F | Ht 67.75 in | Wt 129.0 lb

## 2019-05-26 DIAGNOSIS — K5730 Diverticulosis of large intestine without perforation or abscess without bleeding: Secondary | ICD-10-CM

## 2019-05-26 DIAGNOSIS — K5903 Drug induced constipation: Secondary | ICD-10-CM

## 2019-05-26 DIAGNOSIS — R112 Nausea with vomiting, unspecified: Secondary | ICD-10-CM

## 2019-05-26 NOTE — Patient Instructions (Addendum)
If you are age 77 or older, your body mass index should be between 23-30. Your Body mass index is 19.76 kg/m. If this is out of the aforementioned range listed, please consider follow up with your Primary Care Provider.  If you are age 4 or younger, your body mass index should be between 19-25. Your Body mass index is 19.76 kg/m. If this is out of the aformentioned range listed, please consider follow up with your Primary Care Provider.   Dr Tarri Glenn recommends that you complete a bowel purge (to clean out your bowels). Please do the following: Purchase a bottle of Miralax over the counter as well as a box of 5 mg dulcolax tablets. Take 4 dulcolax tablets. Wait 1 hour. You will then drink 6-8 capfuls of Miralax mixed in an adequate amount of water/juice/gatorade (you may choose which of these liquids to drink) over the next 2-3 hours. You should expect results within 1 to 6 hours after completing the bowel purge.  After you complete your bowel purge you can continue with Miralax 17 grams daily.

## 2019-05-26 NOTE — Progress Notes (Signed)
Referring Provider: Sid Falcon, MD Primary Care Physician:  Sid Falcon, MD  Reason for Consultation:  Nausea and vomiting   IMPRESSION:  Longstanding constipation with recent change in bowel habits ? Overflow diarrhea Nausea and vomiting associated with constipation Recurrent SBO s/p small bowel resection 2012 Sigmoid diverticulosis History of bleeding internal hemorrhoids Hyperplastic polyp on colonoscopy 2012 Family history of colon polyps (father)  She would like to avoid endoscopy and future surgeries, if possible.   PLAN: - Bowel purge with Miralax and Dulcolax - Then start Benefiber daily, increase to BID after 2 weeks if no improvement - Low threshold to add daily Miralax - High fiber diet, drink at least 64 ounces of water daily - Avoid constipating medications - May need another colonoscopy +/- contrasted cross-sectional imaging if no improvement despite bowel purge - Follow-up in 4 weeks, earlier if needed  Please see the "Patient Instructions" section for addition details about the plan.  I spent 45 minutes of time, including in depth chart review (records regarding prior surgery and GI evaluation were difficulty to locate and review), communicating results with the patient directly, face-to-face time with the patient, coordinating care, ordering studies and medications as appropriate, and documentation.   HPI: Nieve Stitely is a 79 y.o. female referred by Dr. Tommy Medal for constipation, nausea and vomiting. The history is obtained through the patient and review of her electronic health record.  Has hypertension, hypercholesterolemia, prior cholecystectomy for symptomatic gallstones, depression and anxiety, and HIV on BIKTARVY.  SBO requiring small bowel resection with Dr. Elam Dutch 2012. Recurrent SBO s/p exp lap and lysis of adhesions with Dr. Barry Dienes 02/12/12.   Longstanding history of opiate use now on Tramadol instead. Appears to have several years of  constipation, but noted a change in bowel habits over the last several months to years. Now develops several episodes of severe diarrhea after an episode of constipation. Frequent nausea and vomiting that resolves with constipation. Often gets near syncopal with straining.  Episodes of constipation occur every 2 weeks.  No associated abdominal pain or bloating. No blood or mucous in the stool. Good appetite. Stable weight.   She stopped stool softeners and Chantix because she thought they were related to the change in her bowel habits.   SBO requiring small bowel resection with Dr. Elam Dutch 2012. Recurrent SBO  History of SBO with 12 inches of intestine removed at [facility]. No constipation.   Three prior colonoscopies at [facility]. Colonoscopy with Dr. Oletta Lamas in 2003 showing moderate internal hemorrhoids and moderate diverticulosis. A colonoscopy with Dr. Gala Romney 05/12/2010 with a hyperplastic polyp.  Records in 2012 show severe constipation where she has almost passed out with straining. Documented 1 month between bowel movements as being normal.   TSH normal 08/19/17.  Calcium 9.9 03/27/19.   Father had colon polyps. No other known family history of colon cancer or polyps. No family history of uterine/endometrial cancer, pancreatic cancer or gastric/stomach cancer.   Past Medical History:  Diagnosis Date  . Acute renal failure (ARF) ([place]) 08/17/2017  . Anxiety 04/05/2012  . Bilateral sensorineural hearing loss 06/11/2014   Mild to moderate on the left side and slight to mild on the right side per audiometry 05/2014.  Hearing aides with possible masking of tinnitus recommended but patient wished to defer secondary to finances.  . Blood transfusion without reported diagnosis    pt denies  . Bursitis of right shoulder 07/12/2012   s/p shoulder injection 07/12/2012   .  Cataract of right eye   . Constipation due to pain medication 04/27/2010  . Diverticulosis 02/08/2012   Extensive left-sided  diverticula on colonoscopy March 2012 per Dr. Gala Romney   . Essential hypertension 07/20/2006  . Genital herpes 07/20/2006  . 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 Heart murmur 1961  . Human immunodeficiency virus disease ([place]) 03/27/1986  . Hyperlipidemia LDL goal < 100 04/05/2012  . Hypokalemia 04/12/2018  . Long-term current use of opiate analgesic 03/17/2016  . Loose stools 04/08/2019  . Lumbar degenerative disc disease 07/20/2006   With chronic back pain   . Marijuana use 07/03/2016  . Micturition syncope 09/20/2015  . Nausea and vomiting 04/08/2019  . Opiate dependence ([place]) 04/12/2018  . 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[place]) 11/01/2011   s/p aortobifem bypass 2009   . Periumbilical hernia 123XX123   1 cm left periumbilical abdominal wall defect  . 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 Right rotator cuff tear 02/01/2013   Responds to periodic steroid injections  . Seborrhea 09/01/2010  . Shoulder pain, right 12/04/2017  . Small bowel obstruction due to adhesions ([place]) 02/08/2012   s/p Exploratory laparotomy, lysis of adhesions 02/12/12    . 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 Tobacco abuse 02/19/2012  . Tobacco abuse   . Vasovagal syncope 02/15/2015  . Vitamin D deficiency 05/29/2018   Vitamin D 18.96 (04/30/2018), treated with ergocalciferol 50,000 units PO QWk X 4 weeks  . Voiding dysfunction    s/p cystoscopy and meatal dilation Dec 2005    Past Surgical History:  Procedure Laterality Date  . ABDOMINAL HYSTERECTOMY    . AORTO-FEMORAL BYPASS GRAFT  04/2007  . APPENDECTOMY    . BREAST SURGERY     Breast biopsy: negative  . CHOLECYSTECTOMY    . COLECTOMY  01/2011   Dr. Margot Chimes; "took out 12 inches of small intestiines and removed blockage"  . EYE SURGERY    . LAPAROTOMY  02/12/2012   Procedure: EXPLORATORY LAPAROTOMY;  Surgeon: Stark Klein, MD;  Location: [facility] OR;  Service: General;  Laterality: N/A;  Exploratory Laparotomy,  lysis of adhesions  . SMALL INTESTINE SURGERY      Current Outpatient Medications  Medication Sig Dispense Refill  . alendronate (FOSAMAX) 70 MG tablet Take 1 tablet (70 mg total) by mouth once a week. 12 tablet 3  . ALPRAZolam (XANAX) 0.5 MG tablet TAKE 1 TABLET BY MOUTH AT BEDTIME AS NEEDED FOR ANXIETY 30 tablet 1  . amLODipine (NORVASC) 10 MG tablet Take 1 tablet (10 mg total) by mouth daily. 90 tablet 3  . aspirin 81 MG EC tablet Take 81 mg by mouth daily.      [place] Kitchen atorvastatin (LIPITOR) 10 MG tablet TAKE 1 TABLET BY MOUTH EVERY DAY (STOP LOVASTATIN) 30 tablet 5  . benazepril (LOTENSIN) 40 MG tablet Take 1 tablet (40 mg total) by mouth daily. 90 tablet 3  . BIKTARVY 50-200-25 MG TABS tablet TAKE 1 TABLET BY MOUTH DAILY 30 tablet 5  . COMBIGAN 0.2-0.5 % ophthalmic solution Place 1 drop into the left eye 2 (two) times daily.     . diclofenac Sodium (VOLTAREN) 1 % GEL Apply 2 g topically 4 (four) times daily. 100 g 3  . dorzolamide (TRUSOPT) 2 % ophthalmic solution Place 1 drop into both eyes 2 (two) times daily.  5  . latanoprost (XALATAN) 0.005 % ophthalmic solution Place 1 drop into both eyes at  bedtime.    . Multiple Vitamin (MULTIVITAMIN WITH MINERALS) TABS Take 1 tablet by mouth daily.    [place] Kitchen terazosin (HYTRIN) 5 MG capsule TAKE ONE CAPSULE BY MOUTH AT BEDTIME 30 capsule 3  . traMADol (ULTRAM) 50 MG tablet TAKE TWO TABLETS BY MOUTH EVERY 8 HOURS AS NEEDED FOR SEVERE PAIN 180 tablet 5  . triamcinolone ointment (KENALOG) 0.1 % APPLY TO AFFECTED AREA TWICE DAILY 454 g 3   No current facility-administered medications for this visit.    Allergies as of 05/26/2019 - Review Complete 05/26/2019  Allergen Reaction Noted  . Hctz [hydrochlorothiazide] Other (See Comments) 09/05/2013    Family History  Problem Relation Age of Onset  . Kidney failure Mother   . Diabetes Mother   . Hypertension Mother   . Heart disease Mother   . Glaucoma Father   . Congestive Heart Failure Sister   .  Diabetes Sister   . Kidney disease Sister   . Diabetes Brother   . Unexplained death Brother 3       Automobile accident  . Hypothyroidism Daughter   . Arthritis Daughter        Neck/Back  . Healthy Son   . HIV/AIDS Brother   . HIV Daughter   . Kidney disease Daughter   . Arthritis Son        Knee    Social History   Socioeconomic History  . Marital status: Widowed    Spouse name: Not on file  . Number of children: 4  . Years of education: 2y college  . Highest education level: Not on file  Occupational History  . 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 Smoking status: Current Every Day Smoker    Packs/day: 1.00    Years: 50.00    Pack years: 50.00    Types: Cigarettes  . Smokeless tobacco: Never Used  Substance and Sexual Activity  . Alcohol use: No    Alcohol/week: 0.0 standard drinks    Comment: "last drink of alcohol ~ 1977"  . Drug use: No    Comment: cutting back on chantix, 1 cigarette this morning  . Sexual activity: Never  Other Topics Concern  . Not on file  Social History Narrative   Lives alone in [place], [place]   Social Determinants of Health   Financial Resource Strain:   . Difficulty of Paying Living Expenses:   Food Insecurity:   . Worried About Charity fundraiser in the Last Year:   . Arboriculturist in the Last Year:   Transportation Needs:   . Film/video editor (Medical):   [place] Kitchen Lack of Transportation (Non-Medical):   Physical Activity:   . Days of Exercise per Week:   . Minutes of Exercise per Session:   Stress:   . Feeling of Stress :   Social Connections:   . Frequency of Communication with Friends and Family:   . Frequency of Social Gatherings with Friends and Family:   . Attends Religious Services:   . Active Member of Clubs or Organizations:   . Attends Archivist Meetings:   [place] Kitchen Marital Status:   Intimate Partner Violence:   . Fear of Current or Ex-Partner:     . Emotionally Abused:   [place] Kitchen Physically Abused:   . Sexually Abused:     Review of Systems: 12 system ROS is negative except as noted above.   Physical  Exam: General:   Alert,  well-nourished, pleasant and cooperative in NAD Head:  Normocephalic and atraumatic. Eyes:  Sclera clear, no icterus.   Conjunctiva pink. Ears:  Normal auditory acuity. Nose:  No deformity, discharge,  or lesions. Mouth:  No deformity or lesions.   Neck:  Supple; no masses or thyromegaly. Lungs:  Clear throughout to auscultation.   No wheezes. Heart:  Regular rate and rhythm; no murmurs. Abdomen:  Soft,nontender, nondistended, normal bowel sounds, no rebound or guarding. No hepatosplenomegaly.   Rectal:  Deferred  Msk:  Symmetrical. No boney deformities LAD: No inguinal or umbilical LAD Extremities:  No clubbing or edema. Neurologic:  Alert and  oriented x4;  grossly nonfocal Skin:  Intact without significant lesions or rashes. Psych:  Alert and cooperative. Normal mood and affect.    Chayson Charters L. Tarri Glenn, MD, MPH 05/26/2019, 10:46 AM

## 2019-05-29 ENCOUNTER — Telehealth: Payer: Self-pay

## 2019-05-29 NOTE — Telephone Encounter (Signed)
PA initiated for Chantix. Sent [place] information through covermymeds. Waiting on a response. [place]

## 2019-05-30 ENCOUNTER — Ambulatory Visit: Payer: Medicare HMO | Admitting: Internal Medicine

## 2019-06-06 DIAGNOSIS — Z23 Encounter for immunization: Secondary | ICD-10-CM | POA: Diagnosis not present

## 2019-06-10 ENCOUNTER — Other Ambulatory Visit: Payer: Self-pay | Admitting: Internal Medicine

## 2019-06-10 DIAGNOSIS — M810 Age-related osteoporosis without current pathological fracture: Secondary | ICD-10-CM

## 2019-06-10 DIAGNOSIS — I10 Essential (primary) hypertension: Secondary | ICD-10-CM

## 2019-06-10 NOTE — Telephone Encounter (Signed)
Pls sch PCP appt HTN F/U

## 2019-07-10 ENCOUNTER — Other Ambulatory Visit: Payer: Self-pay | Admitting: Internal Medicine

## 2019-07-16 ENCOUNTER — Other Ambulatory Visit: Payer: Self-pay

## 2019-07-16 DIAGNOSIS — B029 Zoster without complications: Secondary | ICD-10-CM

## 2019-07-16 MED ORDER — ACYCLOVIR 400 MG PO TABS: ORAL_TABLET | ORAL | 5 refills | Status: DC

## 2019-07-16 NOTE — Telephone Encounter (Signed)
Requesting to speak with a nurse about meds, please call pt back.  

## 2019-07-24 ENCOUNTER — Other Ambulatory Visit: Payer: Self-pay | Admitting: Internal Medicine

## 2019-07-25 DIAGNOSIS — M3501 Sicca syndrome with keratoconjunctivitis: Secondary | ICD-10-CM | POA: Diagnosis not present

## 2019-08-06 ENCOUNTER — Other Ambulatory Visit: Payer: Self-pay | Admitting: Internal Medicine

## 2019-08-06 DIAGNOSIS — F411 Generalized anxiety disorder: Secondary | ICD-10-CM

## 2019-08-08 ENCOUNTER — Encounter: Payer: Self-pay | Admitting: Internal Medicine

## 2019-08-08 ENCOUNTER — Ambulatory Visit (INDEPENDENT_AMBULATORY_CARE_PROVIDER_SITE_OTHER): Payer: Medicare HMO | Admitting: Internal Medicine

## 2019-08-08 VITALS — BP 141/60 | HR 72 | Temp 98.1°F | Ht 67.5 in | Wt 132.2 lb

## 2019-08-08 DIAGNOSIS — I10 Essential (primary) hypertension: Secondary | ICD-10-CM | POA: Diagnosis not present

## 2019-08-08 DIAGNOSIS — K5730 Diverticulosis of large intestine without perforation or abscess without bleeding: Secondary | ICD-10-CM | POA: Diagnosis not present

## 2019-08-08 DIAGNOSIS — F411 Generalized anxiety disorder: Secondary | ICD-10-CM

## 2019-08-08 DIAGNOSIS — E7800 Pure hypercholesterolemia, unspecified: Secondary | ICD-10-CM

## 2019-08-08 DIAGNOSIS — J329 Chronic sinusitis, unspecified: Secondary | ICD-10-CM | POA: Diagnosis not present

## 2019-08-08 DIAGNOSIS — F17200 Nicotine dependence, unspecified, uncomplicated: Secondary | ICD-10-CM | POA: Diagnosis not present

## 2019-08-08 DIAGNOSIS — K5903 Drug induced constipation: Secondary | ICD-10-CM | POA: Diagnosis not present

## 2019-08-08 DIAGNOSIS — F11229 Opioid dependence with intoxication, unspecified: Secondary | ICD-10-CM | POA: Diagnosis not present

## 2019-08-08 DIAGNOSIS — B20 Human immunodeficiency virus [HIV] disease: Secondary | ICD-10-CM

## 2019-08-08 DIAGNOSIS — H409 Unspecified glaucoma: Secondary | ICD-10-CM

## 2019-08-08 DIAGNOSIS — Z8639 Personal history of other endocrine, nutritional and metabolic disease: Secondary | ICD-10-CM

## 2019-08-08 DIAGNOSIS — R69 Illness, unspecified: Secondary | ICD-10-CM | POA: Diagnosis not present

## 2019-08-08 DIAGNOSIS — M75101 Unspecified rotator cuff tear or rupture of right shoulder, not specified as traumatic: Secondary | ICD-10-CM | POA: Diagnosis not present

## 2019-08-08 DIAGNOSIS — I739 Peripheral vascular disease, unspecified: Secondary | ICD-10-CM | POA: Diagnosis not present

## 2019-08-08 DIAGNOSIS — M810 Age-related osteoporosis without current pathological fracture: Secondary | ICD-10-CM

## 2019-08-08 DIAGNOSIS — E785 Hyperlipidemia, unspecified: Secondary | ICD-10-CM

## 2019-08-08 DIAGNOSIS — Z8742 Personal history of other diseases of the female genital tract: Secondary | ICD-10-CM

## 2019-08-08 DIAGNOSIS — R55 Syncope and collapse: Secondary | ICD-10-CM

## 2019-08-08 MED ORDER — ALPRAZOLAM 0.5 MG PO TABS: 0.5000 mg | ORAL_TABLET | Freq: Every evening | ORAL | 1 refills | Status: DC | PRN

## 2019-08-08 NOTE — Progress Notes (Signed)
   Subjective:    Patient ID: Jessica [name], female    DOB: 06/05/1940, 79 y.o.   MRN: [id number]  CC: 4 month follow up for chronic pain, tobacco use.   HPI  Jessica [name] presents with her daughter.  She has a history of HIV, HTN, chronic OA/pain, PVD, HLD, GAD, osteoporosis, glaucoma.  She presents with issues with her chronic right shoulder pain.   She has had injections in the past and this helps her somewhat.  She usually gets 4 months of relief from the injections.  Last one was 2019.  She uses voltaren gel and tramadol PRN for pain as well.  She has a history of anxiety.  She is using 1 0.5mg  tablet at night.  She is requesting to go back up to 1mg  at night for insomnia and anxiety.  She has been on this medication for many years.  She is requesting evaluation for issues with falling, toileting and bathing.  She would like PCS services if possible.     Review of Systems  Constitutional: Negative for activity change, appetite change and fever.  Respiratory: Negative for cough and shortness of breath.   Cardiovascular: Negative for chest pain and leg swelling.  Gastrointestinal: Negative for abdominal distention and anal bleeding.  Genitourinary: Negative for difficulty urinating and dysuria.  Musculoskeletal: Positive for arthralgias and gait problem (uses cane).  Skin: Negative for color change.  Neurological: Negative for dizziness, weakness and light-headedness.  Psychiatric/Behavioral: The patient is nervous/anxious.        Objective:   Physical Exam Constitutional:      General: She is not in acute distress.    Appearance: She is normal weight. She is not toxic-appearing.  Eyes:     General: No scleral icterus.    Conjunctiva/sclera: Conjunctivae normal.  Pulmonary:     Effort: Pulmonary effort is normal. No respiratory distress.  Abdominal:     General: Abdomen is flat.     Palpations: Abdomen is soft.  Musculoskeletal:        General: No swelling,  tenderness or signs of injury.     Right lower leg: No edema.     Left lower leg: No edema.  Neurological:     Mental Status: She is alert and oriented to person, place, and time. Mental status is at baseline.  Psychiatric:        Mood and Affect: Mood normal.        Behavior: Behavior normal.    No blood work today.         Assessment & Plan:  RTC next week for shoulder injection.  3 months for routine follow up.

## 2019-08-08 NOTE — Patient Instructions (Signed)
Thank you for coming in to see me!  I have increased your alprazolam dosing.  Please take 1-2 tablets at night now.   I have placed an order for your to see the lung cancer screening team.  Please complete that screening when you are able.   Our care managers will be calling you about personal care services.  Thank you!  History of cervical testing 1. Hysterectomy partial in 1971  2. Total Hysterectomy in 1977.   3.  abnormal pap in 2008

## 2019-08-10 ENCOUNTER — Other Ambulatory Visit: Payer: Self-pay | Admitting: Internal Medicine

## 2019-08-10 DIAGNOSIS — M5136 Other intervertebral disc degeneration, lumbar region: Secondary | ICD-10-CM

## 2019-08-12 ENCOUNTER — Ambulatory Visit: Payer: Medicare HMO

## 2019-08-12 ENCOUNTER — Telehealth: Payer: Self-pay | Admitting: *Deleted

## 2019-08-12 NOTE — Chronic Care Management (AMB) (Signed)
  Care Management   Social Work Note  08/12/2019 Name: Jessica [name] MRN: [id number] DOB: 1940-12-09  [name] is a 79 y.o. year old female who sees Sid Falcon, MD for primary care. The Care Management team was consulted for assistance with [name].   Contacted patient today to discuss process for requesting [place].  Informed patient that, unfortunately, [place] Medicaid does not cover cost of PCS.  Informed patient that she can contact her Medicaid Caseworker at [facility], however, her eligibility has likely not changed unless she has had a change in income. Discussed private pay option with patient but she is unable to afford this cost.  CCM referral closed.      Ronn Melena, [place] Coordination Social Worker [place] [phone]

## 2019-08-12 NOTE — Assessment & Plan Note (Signed)
She denies any issues with this today.  Continue to monitor.

## 2019-08-12 NOTE — Assessment & Plan Note (Signed)
We discussed today.  She has tried Chantix, but on last trial was not able to tolerate.  We discussed nicotine replacement therapy, however, she is not able to afford.  We discussed calling the [employer] to help with solutions and she will consider.  We further discussed need for lung cancer screening.  She has a CT chest from 2019 which did not show any nodules.  I think she would be appropriate for screening given her age and history.    Plan Consult to [facility] pulmonary for lung cancer screening.

## 2019-08-12 NOTE — Assessment & Plan Note (Signed)
She has no pain with walking or palpation today.  She continues to be able to exercise.  She is taking aspirin and atorvastatin without issue.    Plan Continue aspirin and atorvastatin.  Repeat ABIs at some point (last in 2014 in our system)

## 2019-08-12 NOTE — Telephone Encounter (Signed)
Called pt for shoulder injection appt - schedule for Monday June 7 @ 1315 PM in [facility].

## 2019-08-12 NOTE — Telephone Encounter (Signed)
-----   Message from Sid Falcon, MD sent at 08/08/2019 11:38 AM EDT ----- Can you please contact [name] and let her know that she could come in on June 2 in the AM or June 7 in the AM to get a shoulder injection based on which attendings will be here.    Thank you! Gilles Chiquito, MD

## 2019-08-12 NOTE — Assessment & Plan Note (Signed)
She follows with Dr. Tommy Medal.  She is on Biktarvy.   Plan Treatment per ID clinic.

## 2019-08-12 NOTE — Assessment & Plan Note (Signed)
Last LDL was 66.  She is on atorvastatin  Continue atorvastatin

## 2019-08-12 NOTE — Assessment & Plan Note (Signed)
She is having mixed symptoms at this time.  She went to see GI and was given a "purge" after which she has been having more regular bowel movements.  She is now on miralax daily, but she only takes it every other day.

## 2019-08-12 NOTE — Assessment & Plan Note (Signed)
She reports no bleeding at this time and no issues with abdominal pain.  Her last colonoscopy was in 2012 and she is due to repeat in 2022.

## 2019-08-12 NOTE — Assessment & Plan Note (Signed)
Based on our discussion, manifests mostly as insomnia.  She will increase her evening dose of xanax to 1mg  total.   Follow up UDS at next visit.

## 2019-08-12 NOTE — Assessment & Plan Note (Signed)
She is requesting date of this abnormal pap smear.  I gave her dates and she will follow up with gynecology as per her wishes.

## 2019-08-12 NOTE — Assessment & Plan Note (Signed)
She is having increased pain.  She has Xrays which shows degenerative disease in that shoulder as well.  She will be scheduled for a steroid injection in the clinic.  These tend to help her.

## 2019-08-12 NOTE — Assessment & Plan Note (Signed)
She is following with an eye physician.  On multiple drops and carbonic anhydrase inhibitor.  Continue ophthalmology follow up.

## 2019-08-12 NOTE — Assessment & Plan Note (Signed)
She is doing well with pain control at this time.  She is on tramadol.  We will update her medication contract at next visit.  Her last UDS was 1.5 years ago.  Repeat at next visit.

## 2019-08-12 NOTE — Assessment & Plan Note (Signed)
BP today was 141/60.  She has no chest pain, dizziness, lightheadedness or headache.  She is on amlodipine and terazosin.  Given history of CAD, etc, she may benefit from increased BP control.  We did not discuss at length today, but will continue to monitor.  She was in pain today as well.   Plan Continue amlodipine and terazosin.

## 2019-08-12 NOTE — Assessment & Plan Note (Signed)
Resolved.  Change Dx to history of vitamin D deficiency.

## 2019-08-12 NOTE — Assessment & Plan Note (Signed)
She is on fosamax.  Her issues with falls continues, but she has been doing okay since I last saw her.  She is requesting a review for PCS services.  Consult to CCM will be placed today.

## 2019-08-15 ENCOUNTER — Encounter (HOSPITAL_COMMUNITY): Payer: Self-pay | Admitting: *Deleted

## 2019-08-15 ENCOUNTER — Emergency Department (HOSPITAL_COMMUNITY)
Admission: EM | Admit: 2019-08-15 | Discharge: 2019-08-15 | Disposition: A | Discharge status: Home / Self Care | Payer: Medicare HMO | Attending: Emergency Medicine | Admitting: Emergency Medicine

## 2019-08-15 ENCOUNTER — Emergency Department (HOSPITAL_COMMUNITY): Payer: Medicare HMO

## 2019-08-15 ENCOUNTER — Other Ambulatory Visit: Payer: Self-pay

## 2019-08-15 DIAGNOSIS — B20 Human immunodeficiency virus [HIV] disease: Secondary | ICD-10-CM | POA: Insufficient documentation

## 2019-08-15 DIAGNOSIS — N3000 Acute cystitis without hematuria: Secondary | ICD-10-CM

## 2019-08-15 DIAGNOSIS — R0602 Shortness of breath: Secondary | ICD-10-CM | POA: Diagnosis not present

## 2019-08-15 DIAGNOSIS — Z7982 Long term (current) use of aspirin: Secondary | ICD-10-CM | POA: Diagnosis not present

## 2019-08-15 DIAGNOSIS — I110 Hypertensive heart disease with heart failure: Secondary | ICD-10-CM | POA: Diagnosis not present

## 2019-08-15 DIAGNOSIS — F17210 Nicotine dependence, cigarettes, uncomplicated: Secondary | ICD-10-CM | POA: Diagnosis not present

## 2019-08-15 DIAGNOSIS — Z79899 Other long term (current) drug therapy: Secondary | ICD-10-CM | POA: Insufficient documentation

## 2019-08-15 DIAGNOSIS — I5030 Unspecified diastolic (congestive) heart failure: Secondary | ICD-10-CM | POA: Insufficient documentation

## 2019-08-15 DIAGNOSIS — R69 Illness, unspecified: Secondary | ICD-10-CM | POA: Diagnosis not present

## 2019-08-15 DIAGNOSIS — Z20822 Contact with and (suspected) exposure to covid-19: Secondary | ICD-10-CM | POA: Insufficient documentation

## 2019-08-15 DIAGNOSIS — R509 Fever, unspecified: Secondary | ICD-10-CM | POA: Diagnosis present

## 2019-08-15 DIAGNOSIS — R05 Cough: Secondary | ICD-10-CM | POA: Diagnosis not present

## 2019-08-15 DIAGNOSIS — R5383 Other fatigue: Secondary | ICD-10-CM | POA: Diagnosis not present

## 2019-08-15 LAB — COMPREHENSIVE METABOLIC PANEL
ALT: 8 U/L (ref 0–44)
AST: 16 U/L (ref 15–41)
Albumin: 4 g/dL (ref 3.5–5.0)
Alkaline Phosphatase: 50 U/L (ref 38–126)
Anion gap: 8 (ref 5–15)
BUN: 15 mg/dL (ref 8–23)
CO2: 19 mmol/L — ABNORMAL LOW (ref 22–32)
Calcium: 9.4 mg/dL (ref 8.9–10.3)
Chloride: 112 mmol/L — ABNORMAL HIGH (ref 98–111)
Creatinine, Ser: 0.94 mg/dL (ref 0.44–1.00)
GFR calc Af Amer: >60 mL/min (ref >60)
GFR calc non Af Amer: 59 mL/min — ABNORMAL LOW (ref >60)
Glucose, Bld: 97 mg/dL (ref 70–99)
Potassium: 3.9 mmol/L (ref 3.5–5.1)
Sodium: 139 mmol/L (ref 135–145)
Total Bilirubin: 0.8 mg/dL (ref 0.3–1.2)
Total Protein: 7.2 g/dL (ref 6.5–8.1)

## 2019-08-15 LAB — CBC WITH DIFFERENTIAL/PLATELET
Abs Immature Granulocytes: 0.02 10*3/uL (ref 0.00–0.07)
Basophils Absolute: 0.1 10*3/uL (ref 0.0–0.1)
Basophils Relative: 1 %
Eosinophils Absolute: 0 10*3/uL (ref 0.0–0.5)
Eosinophils Relative: 0 %
HCT: 41.1 % (ref 36.0–46.0)
Hemoglobin: 13.3 g/dL (ref 12.0–15.0)
Immature Granulocytes: 0 %
Lymphocytes Relative: 32 %
Lymphs Abs: 2.5 10*3/uL (ref 0.7–4.0)
MCH: 33 pg (ref 26.0–34.0)
MCHC: 32.4 g/dL (ref 30.0–36.0)
MCV: 102 fL — ABNORMAL HIGH (ref 80.0–100.0)
Monocytes Absolute: 0.4 10*3/uL (ref 0.1–1.0)
Monocytes Relative: 5 %
Neutro Abs: 4.8 10*3/uL (ref 1.7–7.7)
Neutrophils Relative %: 62 %
Platelets: 247 10*3/uL (ref 150–400)
RBC: 4.03 MIL/uL (ref 3.87–5.11)
RDW: 13.2 % (ref 11.5–15.5)
WBC: 7.7 10*3/uL (ref 4.0–10.5)
nRBC: 0 % (ref 0.0–0.2)

## 2019-08-15 LAB — URINALYSIS, ROUTINE W REFLEX MICROSCOPIC
Bilirubin Urine: NEGATIVE
Glucose, UA: NEGATIVE mg/dL
Ketones, ur: NEGATIVE mg/dL
Nitrite: POSITIVE — AB
Protein, ur: NEGATIVE mg/dL
Specific Gravity, Urine: 1.015 (ref 1.005–1.030)
pH: 5 (ref 5.0–8.0)

## 2019-08-15 LAB — SARS CORONAVIRUS 2 BY RT PCR (HOSPITAL ORDER, PERFORMED IN ~~LOC~~ HOSPITAL LAB): SARS Coronavirus 2: NEGATIVE

## 2019-08-15 LAB — LACTIC ACID, PLASMA: Lactic Acid, Venous: 0.8 mmol/L (ref 0.5–1.9)

## 2019-08-15 MED ORDER — CEPHALEXIN 500 MG PO CAPS
500.0000 mg | ORAL_CAPSULE | Freq: Two times a day (BID) | ORAL | 0 refills | Status: AC
Start: 2019-08-15 — End: 2019-08-22

## 2019-08-15 MED ORDER — SODIUM CHLORIDE 0.9% FLUSH: 3.0000 mL | Freq: Once | INTRAVENOUS | Status: DC

## 2019-08-15 MED ORDER — SODIUM CHLORIDE 0.9 % IV BOLUS (SEPSIS)
1000.0000 mL | Freq: Once | INTRAVENOUS | Status: AC
  Administered 2019-08-15: 1000 mL via INTRAVENOUS

## 2019-08-15 MED ORDER — SODIUM CHLORIDE 0.9 % IV SOLN
1.0000 g | Freq: Once | INTRAVENOUS | Status: AC
  Administered 2019-08-15: 1 g via INTRAVENOUS
  Filled 2019-08-15: qty 10

## 2019-08-15 MED ORDER — KETOROLAC TROMETHAMINE 15 MG/ML IJ SOLN
15.0000 mg | Freq: Once | INTRAMUSCULAR | Status: AC
  Administered 2019-08-15: 15 mg via INTRAVENOUS
  Filled 2019-08-15: qty 1

## 2019-08-15 NOTE — Discharge Instructions (Signed)
Thank you for allowing me to care for you today. Please return to the emergency department if you have new or worsening symptoms. Take your medications as instructed.  ° °

## 2019-08-15 NOTE — ED Triage Notes (Signed)
Pt reports several days of severe headache, fever, bodyaches, and cough. Hypotensive at triage. Airway intact.

## 2019-08-15 NOTE — ED Notes (Signed)
Pt ambulatory to and from restroom with steady gait 

## 2019-08-15 NOTE — ED Notes (Signed)
Patient verbalizes understanding of discharge instructions. Opportunity for questioning and answers were provided. Pt discharged from ED. 

## 2019-08-15 NOTE — ED Provider Notes (Signed)
[facility] EMERGENCY DEPARTMENT Provider Note   CSN: [id number] Arrival date & time: 08/15/19  8295     History Chief Complaint  Patient presents with  . Fever  . Headache  . Cough    Jessica [name] is a 79 y.o. female.  Patient is a 79 y/o F from home with his of HIV, anxiety, presenting to the emergency department due to feeling unwell.  She reports that yesterday she began to have a lot of coughing, body aches, fever of 100.4, fatigue and headache.  She denies any vomiting, dysuria, abdominal pain, chest pain, shortness of breath.  She has been vaccinated against Covid.  She reports that she has been taking her HIV medications as prescribed without missing any doses.        Past Medical History:  Diagnosis Date  . Acute renal failure (ARF) ([place]) 08/17/2017  . Anxiety 04/05/2012  . Bilateral sensorineural hearing loss 06/11/2014   Mild to moderate on the left side and slight to mild on the right side per audiometry 05/2014.  Hearing aides with possible masking of tinnitus recommended but patient wished to defer secondary to finances.  . Blood transfusion without reported diagnosis    pt denies  . Bursitis of right shoulder 07/12/2012   s/p shoulder injection 07/12/2012   . Cataract of right eye   . Constipation due to pain medication 04/27/2010  . Diverticulosis 02/08/2012   Extensive left-sided diverticula on colonoscopy March 2012 per Dr. Gala Romney   . Essential hypertension 07/20/2006  . Genital herpes 07/20/2006  . 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 Heart murmur 1961  . Human immunodeficiency virus disease ([place]) 03/27/1986  . Hyperlipidemia LDL goal < 100 04/05/2012  . Hypokalemia 04/12/2018  . Long-term current use of opiate analgesic 03/17/2016  . Loose stools 04/08/2019  . Lumbar degenerative disc disease 07/20/2006   With chronic back pain   . Marijuana use 07/03/2016  . Micturition syncope 09/20/2015  . Nausea and vomiting 04/08/2019  . Opiate dependence  ([place]) 04/12/2018  . 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[place]) 11/01/2011   s/p aortobifem bypass 2009   . Periumbilical hernia 08/13/1306   1 cm left periumbilical abdominal wall defect  . 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 Right rotator cuff tear 02/01/2013   Responds to periodic steroid injections  . Seborrhea 09/01/2010  . Shoulder pain, right 12/04/2017  . Small bowel obstruction due to adhesions ([place]) 02/08/2012   s/p Exploratory laparotomy, lysis of adhesions 02/12/12    . 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 Tobacco abuse 02/19/2012  . Tobacco abuse   . Vasovagal syncope 02/15/2015  . Vitamin D deficiency 05/29/2018   Vitamin D 18.96 (04/30/2018), treated with ergocalciferol 50,000 units PO QWk X 4 weeks  . Voiding dysfunction    s/p cystoscopy and meatal dilation Dec 2005    Patient Active Problem List   Diagnosis Date Noted  . History of vitamin D deficiency 05/29/2018  . Opiate dependence ([place]) 04/12/2018  . Diastolic dysfunction 65/78/4696  . Marijuana use 07/03/2016  . Bilateral sensorineural hearing loss 06/11/2014  . 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 Chronic sinusitis 04/02/2013  . Right rotator cuff tear 02/01/2013  . 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 Generalized anxiety disorder 04/05/2012  . Healthcare maintenance 04/05/2012  . Hyperlipidemia 04/05/2012  . Smoker 02/19/2012  . History of small bowel obstruction 02/08/2012  .  Diverticulosis of colon without hemorrhage 02/08/2012  . 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[place]) 11/01/2011  . Constipation due to pain medication 04/27/2010  . Genital herpes 07/20/2006  . 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 Essential hypertension 07/20/2006  . Lumbar degenerative disc disease 07/20/2006  . History of abnormal cervical Pap smear 07/20/2006  . Human immunodeficiency virus disease ([place]) 03/27/1986    Past Surgical History:    Procedure Laterality Date  . ABDOMINAL HYSTERECTOMY    . AORTO-FEMORAL BYPASS GRAFT  04/2007  . APPENDECTOMY    . BREAST SURGERY     Breast biopsy: negative  . CHOLECYSTECTOMY    . COLECTOMY  01/2011   Dr. Margot Chimes; "took out 12 inches of small intestiines and removed blockage"  . EYE SURGERY    . LAPAROTOMY  02/12/2012   Procedure: EXPLORATORY LAPAROTOMY;  Surgeon: Stark Klein, MD;  Location: [facility] OR;  Service: General;  Laterality: N/A;  Exploratory Laparotomy, lysis of adhesions  . SMALL INTESTINE SURGERY       OB History    Gravida  6   Para  4   Term  4   Preterm      AB  2   Living  4     SAB  2   TAB      Ectopic      Multiple      Live Births              Family History  Problem Relation Age of Onset  . Kidney failure Mother   . Diabetes Mother   . Hypertension Mother   . Heart disease Mother   . Glaucoma Father   . Congestive Heart Failure Sister   . Diabetes Sister   . Kidney disease Sister   . Diabetes Brother   . Unexplained death Brother 11       Automobile accident  . Hypothyroidism Daughter   . Arthritis Daughter        Neck/Back  . Healthy Son   . HIV/AIDS Brother   . HIV Daughter   . Kidney disease Daughter   . Arthritis Son        Knee    Social History   Tobacco Use  . Smoking status: Current Every Day Smoker    Packs/day: 1.00    Years: 50.00    Pack years: 50.00    Types: Cigarettes  . Smokeless tobacco: Never Used  Substance Use Topics  . Alcohol use: No    Alcohol/week: 0.0 standard drinks    Comment: "last drink of alcohol ~ 1977"  . Drug use: No    Comment: cutting back on chantix, 1 cigarette this morning    Home Medications Prior to Admission medications   Medication Sig Start Date End Date Taking? Authorizing Provider  acyclovir (ZOVIRAX) 400 MG tablet TAKE 1 TABLET BY MOUTH THREE TIMES DAILY AS NEEDED FOR OUTBREAKS FOR 7 DAYS Patient taking differently: Take 400 mg by mouth 3 (three) times daily.   07/16/19  Yes Sid Falcon, MD  alendronate (FOSAMAX) 70 MG tablet TAKE 1 TABLET BY MOUTH ONCE WEEKLY ON SUNDAY Patient taking differently: Take 70 mg by mouth once a week.  06/10/19  Yes Bartholomew Crews, MD  ALPRAZolam Duanne Moron) 0.5 MG tablet Take 1-2 tablets (0.5-1 mg total) by mouth at bedtime as needed for anxiety or sleep. Patient taking differently: Take 0.5-1 mg by mouth at bedtime as needed for sleep.  08/08/19  Yes Sid Falcon,  MD  amLODipine (NORVASC) 10 MG tablet Take 1 tablet (10 mg total) by mouth daily. 04/03/19  Yes Sid Falcon, MD  aspirin 81 MG EC tablet Take 81 mg by mouth daily.     Yes [provider]  atorvastatin (LIPITOR) 10 MG tablet TAKE 1 TABLET BY MOUTH EVERY DAY (STOP LOVASTATIN) Patient taking differently: Take 10 mg by mouth daily.  03/25/19  Yes Tommy Medal, Lavell Islam, MD  benazepril (LOTENSIN) 40 MG tablet TAKE 1 TABLET BY MOUTH EVERY DAY Patient taking differently: Take 40 mg by mouth daily.  06/10/19  Yes Bartholomew Crews, MD  BIKTARVY 50-200-25 MG TABS tablet TAKE 1 TABLET BY MOUTH DAILY Patient taking differently: Take 1 tablet by mouth daily.  05/15/19  Yes Tommy Medal, Lavell Islam, MD  COMBIGAN 0.2-0.5 % ophthalmic solution Place 1 drop into the left eye 2 (two) times daily.  01/06/13  Yes [provider]  diclofenac Sodium (VOLTAREN) 1 % GEL APPLY TWO GRAMS TO AFFECTED AREA(S) FOUR TIMES DAILY Patient taking differently: Apply 2 g topically 4 (four) times daily as needed (pain).  07/24/19  Yes Sid Falcon, MD  dorzolamide (TRUSOPT) 2 % ophthalmic solution Place 1 drop into both eyes 2 (two) times daily. 08/03/17  Yes [provider]  latanoprost (XALATAN) 0.005 % ophthalmic solution Place 1 drop into both eyes at bedtime. 08/16/17  Yes [provider]  methazolamide (NEPTAZANE) 25 MG tablet Take 25 mg by mouth 2 (two) times daily. 07/21/19  Yes [provider]  Multiple Vitamin (MULTIVITAMIN WITH MINERALS) TABS  Take 1 tablet by mouth daily.   Yes [provider]  ondansetron (ZOFRAN-ODT) 4 MG disintegrating tablet Take 4 mg by mouth every 6 (six) hours as needed for nausea or vomiting.  05/06/19  Yes [provider]  terazosin (HYTRIN) 5 MG capsule TAKE ONE CAPSULE BY MOUTH AT BEDTIME Patient taking differently: Take 5 mg by mouth at bedtime.  07/10/19  Yes Sid Falcon, MD  traMADol (ULTRAM) 50 MG tablet TAKE TWO TABLETS BY MOUTH EVERY 8 HOURS AS NEEDED FOR SEVERE PAIN Patient taking differently: Take 100 mg by mouth every 8 (eight) hours as needed for severe pain.  08/12/19  Yes Sid Falcon, MD  triamcinolone ointment (KENALOG) 0.1 % APPLY TO AFFECTED AREA TWICE DAILY Patient taking differently: Apply 1 application topically 2 (two) times daily.  02/21/19  Yes Sid Falcon, MD  cephALEXin (KEFLEX) 500 MG capsule Take 1 capsule (500 mg total) by mouth 2 (two) times daily for 7 days. 08/15/19 08/22/19  Alveria Apley, PA-C    Allergies    Hctz [hydrochlorothiazide]  Review of Systems   Review of Systems  Constitutional: Positive for fatigue and fever. Negative for appetite change.  HENT: Negative for congestion, rhinorrhea, sinus pain and sneezing.   Eyes: Negative for photophobia.  Respiratory: Positive for cough. Negative for shortness of breath.   Cardiovascular: Negative for chest pain.  Gastrointestinal: Negative for abdominal pain, diarrhea, nausea and vomiting.  Genitourinary: Negative for dysuria.  Musculoskeletal: Negative for back pain, neck pain and neck stiffness.  Skin: Negative for rash and wound.  Allergic/Immunologic: Positive for immunocompromised state.  Neurological: Positive for headaches. Negative for dizziness.    Physical Exam Updated Vital Signs BP (!) 149/63   Pulse 69   Temp 98.5 F (36.9 C)   Resp 18   Ht 5\' 7"  (1.702 m)   Wt 59.9 kg   SpO2 96%   BMI  20.67 kg/m   Physical Exam Vitals and nursing note reviewed.  Constitutional:       General: She is not in acute distress.    Appearance: Normal appearance. She is well-developed. She is not ill-appearing, toxic-appearing or diaphoretic.  HENT:     Head: Normocephalic.     Mouth/Throat:     Mouth: Mucous membranes are moist.  Eyes:     Conjunctiva/sclera: Conjunctivae normal.  Cardiovascular:     Rate and Rhythm: Normal rate and regular rhythm.  Pulmonary:     Effort: Pulmonary effort is normal.  Abdominal:     General: Abdomen is flat. Bowel sounds are normal.     Palpations: Abdomen is soft.     Tenderness: There is no abdominal tenderness.  Musculoskeletal:        General: Normal range of motion.     Cervical back: Full passive range of motion without pain. No torticollis. No pain with movement, spinous process tenderness or muscular tenderness. Normal range of motion.  Skin:    General: Skin is warm and dry.  Neurological:     Mental Status: She is alert.  Psychiatric:        Mood and Affect: Mood normal.        Behavior: Behavior normal.     ED Results / Procedures / Treatments   Labs (all labs ordered are listed, but only abnormal results are displayed) Labs Reviewed  COMPREHENSIVE METABOLIC PANEL - Abnormal; Notable for the following components:      Result Value   Chloride 112 (*)    CO2 19 (*)    GFR calc non Af Amer 59 (*)    All other components within normal limits  CBC WITH DIFFERENTIAL/PLATELET - Abnormal; Notable for the following components:   MCV 102.0 (*)    All other components within normal limits  URINALYSIS, ROUTINE W REFLEX MICROSCOPIC - Abnormal; Notable for the following components:   APPearance HAZY (*)    Hgb urine dipstick SMALL (*)    Nitrite POSITIVE (*)    Leukocytes,Ua MODERATE (*)    Bacteria, UA FEW (*)    All other components within normal limits  SARS CORONAVIRUS 2 BY RT PCR (HOSPITAL ORDER, [facility])  URINE CULTURE  LACTIC ACID, PLASMA  LACTIC ACID, PLASMA  POC SARS CORONAVIRUS 2  AG -  ED    EKG None  Radiology DG Chest Portable 1 View  Result Date: 08/15/2019 CLINICAL DATA:  Cough.  Shortness of breath. EXAM: PORTABLE CHEST 1 VIEW COMPARISON:  04/08/2018. FINDINGS: Mediastinum and hilar structures normal. No focal infiltrate. No pleural effusion or pneumothorax. Heart size stable. No acute bony abnormality. Quadrant. IMPRESSION: No acute cardiopulmonary disease. Electronically Signed   By: Marcello Moores  Register   On: 08/15/2019 10:01    Procedures Procedures (including critical care time)  Medications Ordered in ED Medications  sodium chloride flush (NS) 0.9 % injection 3 mL (3 mLs Intravenous Not Given 08/15/19 1017)  sodium chloride 0.9 % bolus 1,000 mL (0 mLs Intravenous Stopped 08/15/19 1219)  ketorolac (TORADOL) 15 MG/ML injection 15 mg (15 mg Intravenous Given 08/15/19 1057)  cefTRIAXone (ROCEPHIN) 1 g in sodium chloride 0.9 % 100 mL IVPB (0 g Intravenous Stopped 08/15/19 1333)    ED Course  I have reviewed the triage vital signs and the nursing notes.  Pertinent labs & imaging results that were available during my care of the patient were reviewed by me and considered in my medical  decision making (see chart for details).  Clinical Course as of Aug 15 1535  Fri Aug 15, 2019  1044 Well-appearing 79 year old presenting with URI symptoms since last night.  Daughter is at bedside.  Initially per she presented with blood pressures systolically in the 89Q but this resolved with some rest.  Fluids were also started for this reason.  She is afebrile on arrival.  She is currently asking for something to eat and stating that she feels well and would like to go home.  Awaiting labs, urinalysis, chest x-ray, covid test   [KM]  1239 Work-up is consistent for a urinary tract infection with a unremarkable CMP and CBC.  She is afebrile.  Chest x-ray is clear and Covid test is pending.  She is sitting up in bed and eating a meal and request to go home.  She was given a dose of IV  Rocephin here in the ED and will be discharged home with her daughter in stable condition.  Advised on return precautions.   [KM]    Clinical Course User Index [KM] Jessica [name]   MDM Rules/Calculators/A&P                      Based on review of vitals, medical screening exam, lab work and/or imaging, there does not appear to be an acute, emergent etiology for the patient's symptoms. Counseled pt on good return precautions and encouraged both PCP and ED follow-up as needed.  Prior to discharge, I also discussed incidental imaging findings with patient in detail and advised appropriate, recommended follow-up in detail.  Clinical Impression: 1. Acute cystitis without hematuria     Disposition: Discharge  Prior to providing a prescription for a controlled substance, I independently reviewed the patient's recent prescription history on the [facility]. The patient had no recent or regular prescriptions and was deemed appropriate for a brief, less than 3 day prescription of narcotic for acute analgesia.  This note was prepared with assistance of Systems analyst. Occasional wrong-word or sound-a-like substitutions may have occurred due to the inherent limitations of voice recognition software.  Final Clinical Impression(s) / ED Diagnoses Final diagnoses:  Acute cystitis without hematuria    Rx / DC Orders ED Discharge Orders         Ordered    cephALEXin (KEFLEX) 500 MG capsule  2 times daily     08/15/19 1402           Jessica [name] 08/15/19 1537    Tegeler, Gwenyth Allegra, MD 08/16/19 [phone]

## 2019-08-17 LAB — URINE CULTURE: Culture: >=100000 — AB

## 2019-08-18 ENCOUNTER — Encounter: Payer: Self-pay | Admitting: Internal Medicine

## 2019-08-18 ENCOUNTER — Ambulatory Visit (INDEPENDENT_AMBULATORY_CARE_PROVIDER_SITE_OTHER): Payer: Medicare HMO | Admitting: Internal Medicine

## 2019-08-18 ENCOUNTER — Other Ambulatory Visit: Payer: Self-pay

## 2019-08-18 DIAGNOSIS — M75101 Unspecified rotator cuff tear or rupture of right shoulder, not specified as traumatic: Secondary | ICD-10-CM

## 2019-08-18 NOTE — Patient Instructions (Addendum)
Thank you for allowing [name] to provide your care today.  Today, you were seen for follow-up of right shoulder pain and steroid injection for rotator cuff tearing.  We did steroid injection in your right shoulder.  No complication observed after the injection today. Please come back to clinic if you develop any worsening of pain, fever, swelling or redness at the side of injection.  As always, go to the emergency room if you develop any severe symptoms. Follow up with your primary care for management of your chronic condition. Should you have any questions or concerns please call the internal medicine clinic at [phone].

## 2019-08-18 NOTE — Progress Notes (Signed)
CC: Shoulde pain follow up and steroid injection  HPI:  [name] is a 79 y.o. female with PMHx as documented below, presented for f/u of shoulder pain and steroid injection. Please refer to problem based charting for further details and assessment and plan of current problem and chronic medical conditions.   Past Medical History:  Diagnosis Date  . Acute renal failure (ARF) ([place]) 08/17/2017  . Anxiety 04/05/2012  . Bilateral sensorineural hearing loss 06/11/2014   Mild to moderate on the left side and slight to mild on the right side per audiometry 05/2014.  Hearing aides with possible masking of tinnitus recommended but patient wished to defer secondary to finances.  . Blood transfusion without reported diagnosis    pt denies  . Bursitis of right shoulder 07/12/2012   s/p shoulder injection 07/12/2012   . Cataract of right eye   . Constipation due to pain medication 04/27/2010  . Diverticulosis 02/08/2012   Extensive left-sided diverticula on colonoscopy March 2012 per Dr. Gala Romney   . Essential hypertension 07/20/2006  . Genital herpes 07/20/2006  . 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 Heart murmur 1961  . Human immunodeficiency virus disease ([place]) 03/27/1986  . Hyperlipidemia LDL goal < 100 04/05/2012  . Hypokalemia 04/12/2018  . Long-term current use of opiate analgesic 03/17/2016  . Loose stools 04/08/2019  . Lumbar degenerative disc disease 07/20/2006   With chronic back pain   . Marijuana use 07/03/2016  . Micturition syncope 09/20/2015  . Nausea and vomiting 04/08/2019  . Opiate dependence ([place]) 04/12/2018  . 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[place]) 11/01/2011   s/p aortobifem bypass 2009   . Periumbilical hernia 04/17/3662   1 cm left periumbilical abdominal wall defect  . 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 Right rotator cuff tear 02/01/2013   Responds to periodic steroid injections  . Seborrhea 09/01/2010  .  Shoulder pain, right 12/04/2017  . Small bowel obstruction due to adhesions ([place]) 02/08/2012   s/p Exploratory laparotomy, lysis of adhesions 02/12/12    . 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 Tobacco abuse 02/19/2012  . Tobacco abuse   . Vasovagal syncope 02/15/2015  . Vitamin D deficiency 05/29/2018   Vitamin D 18.96 (04/30/2018), treated with ergocalciferol 50,000 units PO QWk X 4 weeks  . Voiding dysfunction    s/p cystoscopy and meatal dilation Dec 2005   Review of Systems:  Review of Systems  Constitutional: Negative for chills and fever.  Cardiovascular: Negative for chest pain.  Musculoskeletal: Positive for joint pain.    Physical Exam:  Vitals:   08/18/19 1315  BP: 105/71  Pulse: (!) 59  Temp: 97.8 F (36.6 C)  TempSrc: Oral  SpO2: 100%  Weight: 129 lb 14.4 oz (58.9 kg)  Height: 5' 7.5" (1.715 m)    Constitutional: Well-developed and well-nourished. No acute distress.  HENT:  Head: Normocephalic and atraumatic.  Eyes: Conjunctivae are normal, EOM nl Cardiovascular:  RRR, nl S1S2, no murmur, no LEE Respiratory: Effort normal and breath sounds normal. No respiratory distress. No wheezes.  GI: Soft. Bowel sounds are normal. No distension. There is no tenderness.  Neurological: Is alert and oriented x 3  Skin: Not diaphoretic. No erythema.  Psychiatric: Normal mood and affect. Behavior is normal. Judgment and thought content normal.  MSK: Rt shoulder: No erythema or swelling. Passive ROM reproduces the pain. Abduction is limited at about 45 degree.  Assessment & Plan:    See Encounters Tab for problem based charting.  Patient seen with Dr. Evette Doffing

## 2019-08-19 ENCOUNTER — Encounter: Payer: Self-pay | Admitting: Internal Medicine

## 2019-08-19 NOTE — Assessment & Plan Note (Signed)
Right rotator cuff tear: This is a chronic problem.  Diagnosed with rotator cuff tear. Has history of a steroid injection in the past with good response. Her pain is recently increased and did not lately improve with Voltaren gel. Was seen in clinic 08/12/2019 with increased pain. She was a scheduled for steroid injection of right shoulder for rotator cuff injury today. Her exam and [name] is suggestive of rotator cuff tendinoplasty with component of GH joint orthoarteriotony. -We proceed with steroid injection today.  Procedure note: After informed consent was obtained, a timeout was done confirming the need for a right shoulder injection in the correct patient, and after an appropriate prep, freeze spray was used to numb the lateral aspect of the right shoulder. The subacromial bursa was entered using a 27-gauge needle and injected with 40 mg of Kenalog and 2 mL of 1% lidocaine. She tolerated the procedure well without acute complications. Within 5 minutes of the procedure she noted marked subjective improvement of her right shoulder pain.

## 2019-08-20 NOTE — Progress Notes (Signed)
Internal Medicine Clinic Attending  I saw and evaluated the patient.  I personally confirmed the key portions of the history and exam documented by Dr. Myrtie Hawk and I reviewed pertinent patient test results.  I was present for the entirety of the procedure. On exam she has mild pain with passive range of motion; on ultrasound there is a small glenohumeral joint effusion and some cortical irregularity of the humerus. 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 We will try the subacromial steroid injection again today because it gives her good benefit, but I advised her that it may soon become ineffective. At that point I would advise repeating the shoulder xrays and try a glenohumeral joint steroid injection.

## 2019-09-02 ENCOUNTER — Encounter: Payer: Self-pay | Admitting: *Deleted

## 2019-09-08 ENCOUNTER — Telehealth: Payer: Self-pay | Admitting: Acute Care

## 2019-09-08 NOTE — Telephone Encounter (Signed)
LMTC x 1  

## 2019-09-09 ENCOUNTER — Other Ambulatory Visit: Payer: Self-pay | Admitting: Infectious Disease

## 2019-09-09 DIAGNOSIS — E785 Hyperlipidemia, unspecified: Secondary | ICD-10-CM

## 2019-09-09 NOTE — Telephone Encounter (Signed)
Patient's daughter calling to schedule her mother in the lung cancer screening clinic. Forwarded to [employer].

## 2019-09-11 NOTE — Telephone Encounter (Signed)
ATC- Phone rang but no answer- Unable to leave voicemail. Will call back.

## 2019-09-16 NOTE — Telephone Encounter (Signed)
ATC. Unable to leave message. Will call back. Will close this message and refer to referral notes.

## 2019-09-22 ENCOUNTER — Other Ambulatory Visit: Payer: Medicare HMO

## 2019-09-29 DIAGNOSIS — H401133 Primary open-angle glaucoma, bilateral, severe stage: Secondary | ICD-10-CM | POA: Diagnosis not present

## 2019-10-01 NOTE — Addendum Note (Signed)
Addended by: Hulan Fray on: 10/01/2019 07:20 PM   Modules accepted: Orders

## 2019-10-03 ENCOUNTER — Other Ambulatory Visit: Payer: Self-pay | Admitting: Internal Medicine

## 2019-10-03 DIAGNOSIS — F411 Generalized anxiety disorder: Secondary | ICD-10-CM

## 2019-10-03 NOTE — Telephone Encounter (Signed)
Not due until 8/3. Please have patient call in closer to renewal time.

## 2019-10-07 DIAGNOSIS — H401112 Primary open-angle glaucoma, right eye, moderate stage: Secondary | ICD-10-CM | POA: Diagnosis not present

## 2019-10-08 ENCOUNTER — Ambulatory Visit: Payer: Medicare HMO

## 2019-10-08 ENCOUNTER — Other Ambulatory Visit: Payer: Self-pay

## 2019-10-09 ENCOUNTER — Encounter: Payer: Self-pay | Admitting: Infectious Disease

## 2019-10-13 ENCOUNTER — Encounter: Payer: Medicare HMO | Admitting: Infectious Disease

## 2019-10-14 MED ORDER — ALPRAZOLAM 0.5 MG PO TABS: 0.5000 mg | ORAL_TABLET | Freq: Every evening | ORAL | 1 refills | Status: DC | PRN

## 2019-10-14 NOTE — Telephone Encounter (Signed)
Filled today

## 2019-10-14 NOTE — Addendum Note (Signed)
Addended by: Gilles Chiquito B on: 10/14/2019 02:25 PM   Modules accepted: Orders

## 2019-10-16 ENCOUNTER — Other Ambulatory Visit: Payer: Self-pay | Admitting: Infectious Disease

## 2019-10-16 DIAGNOSIS — E785 Hyperlipidemia, unspecified: Secondary | ICD-10-CM

## 2019-10-20 ENCOUNTER — Ambulatory Visit (INDEPENDENT_AMBULATORY_CARE_PROVIDER_SITE_OTHER): Payer: Medicare HMO | Admitting: Infectious Disease

## 2019-10-20 ENCOUNTER — Encounter: Payer: Self-pay | Admitting: Infectious Disease

## 2019-10-20 ENCOUNTER — Other Ambulatory Visit: Payer: Self-pay

## 2019-10-20 VITALS — BP 113/65 | HR 51 | Temp 97.6°F | Wt 120.0 lb

## 2019-10-20 DIAGNOSIS — R519 Headache, unspecified: Secondary | ICD-10-CM | POA: Diagnosis not present

## 2019-10-20 DIAGNOSIS — R69 Illness, unspecified: Secondary | ICD-10-CM | POA: Diagnosis not present

## 2019-10-20 DIAGNOSIS — G4700 Insomnia, unspecified: Secondary | ICD-10-CM | POA: Diagnosis not present

## 2019-10-20 DIAGNOSIS — B20 Human immunodeficiency virus [HIV] disease: Secondary | ICD-10-CM | POA: Diagnosis not present

## 2019-10-20 DIAGNOSIS — G8929 Other chronic pain: Secondary | ICD-10-CM

## 2019-10-20 HISTORY — DX: Headache, unspecified: R51.9

## 2019-10-20 HISTORY — DX: Insomnia, unspecified: G47.00
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
## 2019-11-09 ENCOUNTER — Other Ambulatory Visit: Payer: Self-pay | Admitting: Infectious Disease

## 2019-11-09 ENCOUNTER — Other Ambulatory Visit: Payer: Self-pay | Admitting: Internal Medicine

## 2019-11-09 DIAGNOSIS — E785 Hyperlipidemia, unspecified: Secondary | ICD-10-CM

## 2019-11-11 DIAGNOSIS — R55 Syncope and collapse: Secondary | ICD-10-CM | POA: Diagnosis not present

## 2019-11-11 DIAGNOSIS — R197 Diarrhea, unspecified: Secondary | ICD-10-CM | POA: Diagnosis not present

## 2019-11-11 DIAGNOSIS — J9811 Atelectasis: Secondary | ICD-10-CM | POA: Diagnosis not present

## 2019-11-11 DIAGNOSIS — M1612 Unilateral primary osteoarthritis, left hip: Secondary | ICD-10-CM | POA: Diagnosis not present

## 2019-11-11 DIAGNOSIS — E861 Hypovolemia: Secondary | ICD-10-CM | POA: Diagnosis not present

## 2019-11-11 DIAGNOSIS — E860 Dehydration: Secondary | ICD-10-CM | POA: Diagnosis not present

## 2019-11-11 DIAGNOSIS — N289 Disorder of kidney and ureter, unspecified: Secondary | ICD-10-CM | POA: Insufficient documentation

## 2019-11-11 DIAGNOSIS — Z7983 Long term (current) use of bisphosphonates: Secondary | ICD-10-CM | POA: Diagnosis not present

## 2019-11-11 DIAGNOSIS — R69 Illness, unspecified: Secondary | ICD-10-CM | POA: Diagnosis not present

## 2019-11-11 DIAGNOSIS — I10 Essential (primary) hypertension: Secondary | ICD-10-CM | POA: Diagnosis not present

## 2019-11-11 DIAGNOSIS — Z7982 Long term (current) use of aspirin: Secondary | ICD-10-CM | POA: Diagnosis not present

## 2019-11-11 DIAGNOSIS — I959 Hypotension, unspecified: Secondary | ICD-10-CM | POA: Diagnosis not present

## 2019-11-11 DIAGNOSIS — A419 Sepsis, unspecified organism: Secondary | ICD-10-CM | POA: Diagnosis not present

## 2019-11-11 DIAGNOSIS — I739 Peripheral vascular disease, unspecified: Secondary | ICD-10-CM | POA: Diagnosis not present

## 2019-11-11 DIAGNOSIS — R937 Abnormal findings on diagnostic imaging of other parts of musculoskeletal system: Secondary | ICD-10-CM | POA: Diagnosis not present

## 2019-11-11 DIAGNOSIS — M47817 Spondylosis without myelopathy or radiculopathy, lumbosacral region: Secondary | ICD-10-CM | POA: Diagnosis not present

## 2019-11-11 DIAGNOSIS — R52 Pain, unspecified: Secondary | ICD-10-CM | POA: Diagnosis not present

## 2019-11-11 DIAGNOSIS — R0902 Hypoxemia: Secondary | ICD-10-CM | POA: Diagnosis not present

## 2019-11-11 DIAGNOSIS — R0689 Other abnormalities of breathing: Secondary | ICD-10-CM | POA: Diagnosis not present

## 2019-11-11 DIAGNOSIS — Z20822 Contact with and (suspected) exposure to covid-19: Secondary | ICD-10-CM | POA: Diagnosis not present

## 2019-11-12 DIAGNOSIS — R197 Diarrhea, unspecified: Secondary | ICD-10-CM | POA: Diagnosis not present

## 2019-11-12 DIAGNOSIS — R55 Syncope and collapse: Secondary | ICD-10-CM | POA: Diagnosis not present

## 2019-11-20 ENCOUNTER — Encounter: Payer: Self-pay | Admitting: Internal Medicine

## 2019-11-20 ENCOUNTER — Ambulatory Visit (INDEPENDENT_AMBULATORY_CARE_PROVIDER_SITE_OTHER): Payer: Medicare HMO | Admitting: Internal Medicine

## 2019-11-20 ENCOUNTER — Other Ambulatory Visit: Payer: Self-pay

## 2019-11-20 DIAGNOSIS — R519 Headache, unspecified: Secondary | ICD-10-CM | POA: Diagnosis not present

## 2019-11-20 DIAGNOSIS — R55 Syncope and collapse: Secondary | ICD-10-CM

## 2019-11-20 DIAGNOSIS — G8929 Other chronic pain: Secondary | ICD-10-CM | POA: Diagnosis not present

## 2019-11-20 MED ORDER — TOPIRAMATE 25 MG PO TABS: 25.0000 mg | ORAL_TABLET | Freq: Every day | ORAL | 3 refills | Status: DC

## 2019-11-20 NOTE — Assessment & Plan Note (Addendum)
Patient presents to the office today after a hospitalization over Labor day weekend. Patient endorsed multiple bouts of diarrhea, and "Passed out" while on the toilet, and woke up in a pool of diarrhea. Her labs were unremarkable on admission, she was fluid resuscitated and  DC home with a Dx of VV syncope.   Patient states that she feels much more improved after her hospitalization. She states that she "needed" the stay, and her diarrhea has since resolved. She states she has had one episode of feeling "light headed" but did not lose consciousness or fall.   A:  Patient with recent DC from [facility] for VV syncope after LOC while on the toilet. Was given IV fluids and observed ON, and DC the next morning. 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 May also have component of polypharmacy as patient is on both a CCB and A-blocker. Discussed importance of staying hydrated, and gradual standing from sitting. Patient voiced understanding.  - Patient much improved from initial hospital visit. Continue to monitor if recurrent episodes occur at next visit.  - If recurrent episodes, consider changes in HTN medications.

## 2019-11-20 NOTE — Assessment & Plan Note (Signed)
[name] comes to the office with complaints of frequently occurring headaches for the past 1.5 years. Patient describes the headache as a "pounding and breathing fire" sensation She states that the headaches occur across her entire head. She endorses "yellow" vision changes before and during her headaches. She states that tylenol, and occasional Advil, have not relieved her headaches. Her daughter has given her gabapentin for her headaches which has improved them. Additionally, sleeping alleviates her headaches, but patient endorses feeling "groggy" after sleep.   A:  Given etiology and description of headaches aligns with migraines, but may be mixed migraine and tension etiology. Will start topiramate today for prophylactic therapy. Patient instructed to reach out to the clinic if patient has continued or worsening headaches.   - Topiramate 25 mg QD at bedtime.

## 2019-11-20 NOTE — Progress Notes (Signed)
CC: Hospital Follow Up, Headaches  HPI:  [name] is a 79 y.o. female, with a PMH noted below, who presents to the clinic for a hospital follow up and headaches. To see the management of their acute and chronic conditions, please refer to the A&P note under the encounters tab.   Past Medical History:  Diagnosis Date  . Acute renal failure (ARF) ([place]) 08/17/2017  . Anxiety 04/05/2012  . Bilateral sensorineural hearing loss 06/11/2014   Mild to moderate on the left side and slight to mild on the right side per audiometry 05/2014.  Hearing aides with possible masking of tinnitus recommended but patient wished to defer secondary to finances.  . Blood transfusion without reported diagnosis    pt denies  . Bursitis of right shoulder 07/12/2012   s/p shoulder injection 07/12/2012   . Cataract of right eye   . Constipation due to pain medication 04/27/2010  . Diverticulosis 02/08/2012   Extensive left-sided diverticula on colonoscopy March 2012 per Dr. Gala Romney   . Essential hypertension 07/20/2006  . Genital herpes 07/20/2006  . 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 Headache 10/20/2019  . Heart murmur 1961  . Human immunodeficiency virus disease ([place]) 03/27/1986  . Hyperlipidemia LDL goal < 100 04/05/2012  . Hypokalemia 04/12/2018  . Insomnia 10/20/2019  . Long-term current use of opiate analgesic 03/17/2016  . Loose stools 04/08/2019  . Lumbar degenerative disc disease 07/20/2006   With chronic back pain   . Marijuana use 07/03/2016  . Micturition syncope 09/20/2015  . Nausea and vomiting 04/08/2019  . Opiate dependence ([place]) 04/12/2018  . 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[place]) 11/01/2011   s/p aortobifem bypass 2009   . Periumbilical hernia 5/0/9326   1 cm left periumbilical abdominal wall defect  . 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 Right rotator cuff tear 02/01/2013   Responds to periodic steroid injections  . Seborrhea  09/01/2010  . Shoulder pain, right 12/04/2017  . Small bowel obstruction due to adhesions ([place]) 02/08/2012   s/p Exploratory laparotomy, lysis of adhesions 02/12/12    . 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 Tobacco abuse 02/19/2012  . Tobacco abuse   . Vasovagal syncope 02/15/2015  . Vitamin D deficiency 05/29/2018   Vitamin D 18.96 (04/30/2018), treated with ergocalciferol 50,000 units PO QWk X 4 weeks  . Voiding dysfunction    s/p cystoscopy and meatal dilation Dec 2005   Review of Systems:   Review of Systems  Constitutional: Negative for chills, fever, malaise/fatigue and weight loss.  Eyes: Negative for blurred vision, double vision and photophobia.  Cardiovascular: Negative for chest pain, palpitations, orthopnea, claudication and leg swelling.  Gastrointestinal: Negative for abdominal pain, constipation, diarrhea, heartburn, nausea and vomiting.  Neurological: Positive for headaches. Negative for dizziness, tingling and tremors.     Physical Exam:  Vitals:   11/20/19 0840  BP: (!) 149/56  Pulse: 63  Temp: 97.8 F (36.6 C)  TempSrc: Oral  SpO2: 100%  Weight: 118 lb 8 oz (53.8 kg)  Height: 5' 7.75" (1.721 m)   Physical Exam Constitutional:      Appearance: Normal appearance.     Comments: Pleasant, appears appropriate age, NAD.   HENT:     Head: Normocephalic.  Cardiovascular:     Rate and Rhythm: Normal rate and regular rhythm.     Pulses: Normal pulses.     Heart sounds: Normal heart  sounds. No murmur heard.  No friction rub. No gallop.   Pulmonary:     Effort: Pulmonary effort is normal.     Breath sounds: Normal breath sounds.  Abdominal:     General: Abdomen is flat. Bowel sounds are normal.     Tenderness: There is no abdominal tenderness. There is no guarding.  Neurological:     Mental Status: She is alert and oriented to person, place, and time.  Psychiatric:        Mood and Affect: Mood normal.        Behavior: Behavior normal.      Assessment & Plan:   See Encounters Tab for problem based charting.  Patient discussed with Dr. Dareen Piano

## 2019-11-20 NOTE — Patient Instructions (Addendum)
To [name],   It was a pleasure meeting you today! Today we discussed your recent hospitalization and headaches. I am glad that diarrhea has resolved. Continue to stay hydrated. For your headaches they do sound like migraines. I will start you on a medication to help with migraine management (topiramate). Please take the medication as indicated. Have a good day!  Sincerely,  Maudie Mercury, MD

## 2019-11-24 NOTE — Progress Notes (Signed)
Internal Medicine Clinic Attending ? ?Case discussed with Dr. Winters  At the time of the visit.  We reviewed the resident?s history and exam and pertinent patient test results.  I agree with the assessment, diagnosis, and plan of care documented in the resident?s note.  ?

## 2019-11-25 ENCOUNTER — Telehealth: Payer: Self-pay

## 2019-11-25 NOTE — Telephone Encounter (Signed)
Received voicemail from patient today requesting call back. Attempted to call patient back, but call was dropped. Not able to leave a message at this time. Jessica [name]

## 2019-12-05 ENCOUNTER — Other Ambulatory Visit: Payer: Self-pay | Admitting: Infectious Disease

## 2019-12-05 DIAGNOSIS — B20 Human immunodeficiency virus [HIV] disease: Secondary | ICD-10-CM

## 2019-12-10 ENCOUNTER — Other Ambulatory Visit: Payer: Self-pay | Admitting: Internal Medicine

## 2019-12-10 DIAGNOSIS — F411 Generalized anxiety disorder: Secondary | ICD-10-CM

## 2019-12-31 ENCOUNTER — Ambulatory Visit: Payer: Self-pay | Admitting: Neurology

## 2020-01-06 ENCOUNTER — Other Ambulatory Visit: Payer: Self-pay | Admitting: Internal Medicine

## 2020-01-21 DIAGNOSIS — R69 Illness, unspecified: Secondary | ICD-10-CM | POA: Diagnosis not present

## 2020-02-04 ENCOUNTER — Other Ambulatory Visit: Payer: Self-pay | Admitting: Internal Medicine

## 2020-02-04 DIAGNOSIS — M5136 Other intervertebral disc degeneration, lumbar region: Secondary | ICD-10-CM

## 2020-02-26 ENCOUNTER — Other Ambulatory Visit: Payer: Self-pay | Admitting: Internal Medicine

## 2020-02-26 DIAGNOSIS — F411 Generalized anxiety disorder: Secondary | ICD-10-CM

## 2020-03-09 ENCOUNTER — Other Ambulatory Visit: Payer: Self-pay | Admitting: Internal Medicine

## 2020-03-09 DIAGNOSIS — R21 Rash and other nonspecific skin eruption: Secondary | ICD-10-CM

## 2020-03-15 ENCOUNTER — Other Ambulatory Visit: Payer: Self-pay

## 2020-03-15 ENCOUNTER — Ambulatory Visit (INDEPENDENT_AMBULATORY_CARE_PROVIDER_SITE_OTHER): Payer: Medicare Other | Admitting: Infectious Disease

## 2020-03-15 ENCOUNTER — Encounter: Payer: Self-pay | Admitting: Infectious Disease

## 2020-03-15 ENCOUNTER — Other Ambulatory Visit (HOSPITAL_COMMUNITY)
Admission: RE | Admit: 2020-03-15 | Discharge: 2020-03-15 | Disposition: A | Discharge status: Home / Self Care | Payer: Medicare Other | Source: Ambulatory Visit | Attending: Infectious Disease | Admitting: Infectious Disease

## 2020-03-15 VITALS — BP 146/70 | HR 50 | Temp 97.8°F | Wt 113.0 lb

## 2020-03-15 DIAGNOSIS — F11229 Opioid dependence with intoxication, unspecified: Secondary | ICD-10-CM

## 2020-03-15 DIAGNOSIS — B20 Human immunodeficiency virus [HIV] disease: Secondary | ICD-10-CM | POA: Diagnosis not present

## 2020-03-15 DIAGNOSIS — R55 Syncope and collapse: Secondary | ICD-10-CM | POA: Diagnosis not present

## 2020-03-15 DIAGNOSIS — K5909 Other constipation: Secondary | ICD-10-CM

## 2020-03-15 NOTE — Progress Notes (Signed)
Subjective:    Patient ID: Jessica [name], female    DOB: Jun 04, 1940, 80 y.o.   MRN: [id number]  HPI  Jessica [name] is a 80 y.o. female with HIV infection who has done superbly well on her antiviral regimen, changed from Atripla to [facility] and DESCOVY -->BIKTARVY with reasonable virological suppression.  She was admitted to [facility] with yet another episode of syncope.  It sounds to have been vasovagal associated with straining while she was having a bowel movement.  Note she does have bradycardia at batt baseline which was thought to be due in part due to medications though I am not sure what the clear culprit is based on her medication list.  She is being followed closely by internal medicine.    Past Medical History:  Diagnosis Date  . Acute renal failure (ARF) ([facility]) 08/17/2017  . Anxiety 04/05/2012  . Bilateral sensorineural hearing loss 06/11/2014   Mild to moderate on the left side and slight to mild on the right side per audiometry 05/2014.  Hearing aides with possible masking of tinnitus recommended but patient wished to defer secondary to finances.  . Blood transfusion without reported diagnosis    pt denies  . Bursitis of right shoulder 07/12/2012   s/p shoulder injection 07/12/2012   . Cataract of right eye   . Constipation due to pain medication 04/27/2010  . Diverticulosis 02/08/2012   Extensive left-sided diverticula on colonoscopy March 2012 per Dr. Jena Gauss   . Essential hypertension 07/20/2006  . Genital herpes 07/20/2006  . 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 Headache 10/20/2019  . Heart murmur 1961  . Human immunodeficiency virus disease ([facility]) 03/27/1986  . Hyperlipidemia LDL goal < 100 04/05/2012  . Hypokalemia 04/12/2018  . Insomnia 10/20/2019  . Long-term current use of opiate analgesic 03/17/2016  . Loose stools 04/08/2019  . Lumbar degenerative disc disease 07/20/2006   With chronic back pain   . Marijuana use 07/03/2016  . Micturition syncope 09/20/2015  .  Nausea and vomiting 04/08/2019  . Opiate dependence ([facility]) 04/12/2018  . 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   s/p aortobifem bypass 2009   . Periumbilical hernia 05/18/2014   1 cm left periumbilical abdominal wall defect  . 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 Right rotator cuff tear 02/01/2013   Responds to periodic steroid injections  . Seborrhea 09/01/2010  . Shoulder pain, right 12/04/2017  . Small bowel obstruction due to adhesions ([facility]) 02/08/2012   s/p Exploratory laparotomy, lysis of adhesions 02/12/12    . 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 Tobacco abuse 02/19/2012  . Tobacco abuse   . Vasovagal syncope 02/15/2015  . Vitamin D deficiency 05/29/2018   Vitamin D 18.96 (04/30/2018), treated with ergocalciferol 50,000 units PO QWk X 4 weeks  . Voiding dysfunction    s/p cystoscopy and meatal dilation Dec 2005    Past Surgical History:  Procedure Laterality Date  . ABDOMINAL HYSTERECTOMY    . AORTO-FEMORAL BYPASS GRAFT  04/2007  . APPENDECTOMY    . BREAST SURGERY     Breast biopsy: negative  . CHOLECYSTECTOMY    . COLECTOMY  01/2011   Dr. Jamey Ripa; "took out 12 inches of small intestiines and removed blockage"  . EYE SURGERY    . LAPAROTOMY  02/12/2012   Procedure: EXPLORATORY LAPAROTOMY;  Surgeon: Almond Lint, MD;  Location: [facility] OR;  Service: General;  Laterality: N/A;  Exploratory Laparotomy, lysis of adhesions  . SMALL INTESTINE SURGERY      Family History  Problem Relation Age of Onset  . Kidney failure Mother   . Diabetes Mother   . Hypertension Mother   . Heart disease Mother   . Glaucoma Father   . Congestive Heart Failure Sister   . Diabetes Sister   . Kidney disease Sister   . Diabetes Brother   . Unexplained death Brother 45       Automobile accident  . Hypothyroidism Daughter   . Arthritis Daughter        Neck/Back  . Healthy Son   . HIV/AIDS Brother   . HIV Daughter   .  Kidney disease Daughter   . Arthritis Son        Knee      Social History   Socioeconomic History  . Marital status: Widowed    Spouse name: Not on file  . Number of children: 4  . Years of education: 2y college  . Highest education level: Not on file  Occupational History  . 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 Smoking status: Current Every Day Smoker    Packs/day: 0.50    Years: 50.00    Pack years: 25.00    Types: Cigarettes  . Smokeless tobacco: Never Used  Vaping Use  . Vaping Use: Never used  Substance and Sexual Activity  . Alcohol use: No    Alcohol/week: 0.0 standard drinks    Comment: "last drink of alcohol ~ 1977"  . Drug use: No    Comment: cutting back on chantix, 1 cigarette this morning  . Sexual activity: Never  Other Topics Concern  . Not on file  Social History Narrative   Lives alone in [place], [place]   Social Determinants of Health   Financial Resource Strain: Not on file  Food Insecurity: Not on file  Transportation Needs: Not on file  Physical Activity: Not on file  Stress: Not on file  Social Connections: Not on file    Allergies  Allergen Reactions  . Hctz [Hydrochlorothiazide] Other (See Comments)    Dizziness, syncope; does NOT wish to take anymore     Current Outpatient Medications:  .  terazosin (HYTRIN) 5 MG capsule, Take 1 capsule (5 mg total) by mouth at bedtime., Disp: 90 capsule, Rfl: 3 .  acyclovir (ZOVIRAX) 400 MG tablet, TAKE 1 TABLET BY MOUTH THREE TIMES DAILY AS NEEDED FOR OUTBREAKS FOR 7 DAYS (Patient taking differently: Take 400 mg by mouth 3 (three) times daily. ), Disp: 21 tablet, Rfl: 5 .  alendronate (FOSAMAX) 70 MG tablet, TAKE 1 TABLET BY MOUTH ONCE WEEKLY ON SUNDAY (Patient taking differently: Take 70 mg by mouth once a week. ), Disp: 12 tablet, Rfl: 3 .  ALPRAZolam (XANAX) 0.5 MG tablet, TAKE 1 OR 2 TABLETS BY MOUTH AT BEDTIME AS NEEDED FOR ANXIETY OR SLEEP,  Disp: 60 tablet, Rfl: 1 .  amLODipine (NORVASC) 10 MG tablet, Take 1 tablet (10 mg total) by mouth daily., Disp: 90 tablet, Rfl: 3 .  aspirin 81 MG EC tablet, Take 81 mg by mouth daily.  , Disp: , Rfl:  .  atorvastatin (LIPITOR) 10 MG tablet, TAKE 1 TABLET BY MOUTH EVERY DAY (STOP LOVASTATIN), Disp: 30 tablet, Rfl: 4 .  benazepril (LOTENSIN) 40 MG tablet, TAKE 1 TABLET BY MOUTH EVERY DAY (Patient taking differently: Take  40 mg by mouth daily. ), Disp: 90 tablet, Rfl: 3 .  BIKTARVY 50-200-25 MG TABS tablet, TAKE 1 TABLET BY MOUTH DAILY, Disp: 30 tablet, Rfl: 5 .  COMBIGAN 0.2-0.5 % ophthalmic solution, Place 1 drop into the left eye 2 (two) times daily. , Disp: , Rfl:  .  diclofenac Sodium (VOLTAREN) 1 % GEL, Apply 2 g topically 4 (four) times daily as needed (pain)., Disp: 100 g, Rfl: 5 .  dorzolamide (TRUSOPT) 2 % ophthalmic solution, Place 1 drop into both eyes 2 (two) times daily., Disp: , Rfl: 5 .  latanoprost (XALATAN) 0.005 % ophthalmic solution, Place 1 drop into both eyes at bedtime., Disp: , Rfl:  .  methazolamide (NEPTAZANE) 25 MG tablet, Take 25 mg by mouth 2 (two) times daily., Disp: , Rfl:  .  Multiple Vitamin (MULTIVITAMIN WITH MINERALS) TABS, Take 1 tablet by mouth daily., Disp: , Rfl:  .  ondansetron (ZOFRAN-ODT) 4 MG disintegrating tablet, Take 4 mg by mouth every 6 (six) hours as needed for nausea or vomiting. , Disp: , Rfl:  .  topiramate (TOPAMAX) 25 MG tablet, Take 1 tablet (25 mg total) by mouth at bedtime., Disp: 90 tablet, Rfl: 3 .  traMADol (ULTRAM) 50 MG tablet, TAKE TWO TABLETS BY MOUTH EVERY 8 HOURS AS NEEDED FOR SEVERE PAIN, Disp: 180 tablet, Rfl: 5 .  triamcinolone ointment (KENALOG) 0.1 %, Apply 1 application topically 2 (two) times daily., Disp: 454 g, Rfl: 3    Review of Systems  Constitutional: Negative for activity change, appetite change, chills, diaphoresis, fatigue, fever and unexpected weight change.  HENT: Negative for congestion, rhinorrhea, sinus  pressure, sneezing, sore throat and trouble swallowing.   Eyes: Negative for photophobia and visual disturbance.  Respiratory: Negative for cough, chest tightness, shortness of breath, wheezing and stridor.   Cardiovascular: Negative for chest pain, palpitations and leg swelling.  Gastrointestinal: Positive for constipation. Negative for abdominal distention, abdominal pain, anal bleeding, blood in stool, diarrhea, nausea and vomiting.  Genitourinary: Negative for difficulty urinating, dysuria, flank pain and hematuria.  Musculoskeletal: Negative for arthralgias, back pain, gait problem, joint swelling and myalgias.  Skin: Negative for color change, pallor, rash and wound.  Neurological: Negative for dizziness, tremors, weakness and light-headedness.  Hematological: Negative for adenopathy. Does not bruise/bleed easily.  Psychiatric/Behavioral: Negative for agitation, behavioral problems, confusion, decreased concentration, dysphoric mood and sleep disturbance.       Objective:   Physical Exam Constitutional:      General: She is not in acute distress.    Appearance: Normal appearance. She is well-developed and well-nourished. She is not ill-appearing or diaphoretic.  HENT:     Head: Normocephalic and atraumatic.     Right Ear: Hearing and external ear normal.     Left Ear: Hearing and external ear normal.     Nose: No nasal deformity, rhinorrhea or epistaxis.  Eyes:     General: No scleral icterus.    Extraocular Movements: EOM normal.     Conjunctiva/sclera: Conjunctivae normal.     Right eye: Right conjunctiva is not injected.     Left eye: Left conjunctiva is not injected.     Pupils: Pupils are equal, round, and reactive to light.  Neck:     Vascular: No JVD.  Cardiovascular:     Rate and Rhythm: Normal rate and regular rhythm.     Heart sounds: S1 normal and S2 normal.  Abdominal:     General: Bowel sounds are normal. There is no distension  or ascites.     Palpations:  Abdomen is soft. There is no hepatosplenomegaly.     Tenderness: There is no abdominal tenderness.  Musculoskeletal:        General: Normal range of motion.     Right shoulder: Normal.     Left shoulder: Normal.     Cervical back: Normal range of motion and neck supple.     Right hip: Normal.     Left hip: Normal.     Right knee: Normal.     Left knee: Normal.  Lymphadenopathy:     Head:     Right side of head: No submandibular, preauricular or posterior auricular adenopathy.     Left side of head: No submandibular, preauricular or posterior auricular adenopathy.     Cervical: No cervical adenopathy.     Right cervical: No superficial or deep cervical adenopathy.    Left cervical: No superficial or deep cervical adenopathy.  Skin:    General: Skin is warm, dry and intact.     Coloration: Skin is not pale.     Findings: No abrasion, bruising, ecchymosis, erythema, lesion or rash.     Nails: There is no clubbing or cyanosis.  Neurological:     Mental Status: She is alert and oriented to person, place, and time.     Sensory: No sensory deficit.     Coordination: Coordination normal.     Gait: Gait normal.     Deep Tendon Reflexes: Strength normal.  Psychiatric:        Attention and Perception: She is attentive.        Mood and Affect: Mood and affect and mood normal.        Speech: Speech normal.        Behavior: Behavior normal. Behavior is cooperative.        Thought Content: Thought content normal.        Cognition and Memory: Cognition and memory normal.        Judgment: Judgment normal.           Assessment & Plan:  HIV disease continue Biktarvy check labs today and she can return to clinic in July for renewal of her SPAP program  COVID prevention she has had 3 shots including her booster which she had in November.  Vasovagal syncope: I have counseled her to ensure that she is taking measures to avoid constipation as the straining is undoubtedly precipitating the  nasal vagal syncope.  If the offending drug causing her bradycardia could be identified this could be helpful as well.  We will defer to primary care though.

## 2020-03-16 LAB — URINE CYTOLOGY ANCILLARY ONLY
Chlamydia: NEGATIVE
Comment: NEGATIVE
Comment: NORMAL
Neisseria Gonorrhea: NEGATIVE

## 2020-03-16 LAB — T-HELPER CELL (CD4) - (RCID CLINIC ONLY)
CD4 % Helper T Cell: 44 % (ref 33–65)
CD4 T Cell Abs: 1865 /uL — ABNORMAL HIGH (ref 400–1790)

## 2020-03-24 LAB — CBC WITH DIFFERENTIAL/PLATELET
Absolute Monocytes: 326 cells/uL (ref 200–950)
Basophils Absolute: 61 cells/uL (ref 0–200)
Basophils Relative: 0.9 %
Eosinophils Absolute: 7 cells/uL — ABNORMAL LOW (ref 15–500)
Eosinophils Relative: 0.1 %
HCT: 38 % (ref 35.0–45.0)
Hemoglobin: 12.7 g/dL (ref 11.7–15.5)
Lymphs Abs: 4148 cells/uL — ABNORMAL HIGH (ref 850–3900)
MCH: 32.5 pg (ref 27.0–33.0)
MCHC: 33.4 g/dL (ref 32.0–36.0)
MCV: 97.2 fL (ref 80.0–100.0)
MPV: 9.8 fL (ref 7.5–12.5)
Monocytes Relative: 4.8 %
Neutro Abs: 2258 cells/uL (ref 1500–7800)
Neutrophils Relative %: 33.2 %
Platelets: 230 10*3/uL (ref 140–400)
RBC: 3.91 10*6/uL (ref 3.80–5.10)
RDW: 12.1 % (ref 11.0–15.0)
Total Lymphocyte: 61 %
WBC: 6.8 10*3/uL (ref 3.8–10.8)

## 2020-03-24 LAB — COMPLETE METABOLIC PANEL WITH GFR
AG Ratio: 1.3 (calc) (ref 1.0–2.5)
ALT: 3 U/L — ABNORMAL LOW (ref 6–29)
AST: 15 U/L (ref 10–35)
Albumin: 4.1 g/dL (ref 3.6–5.1)
Alkaline phosphatase (APISO): 49 U/L (ref 37–153)
BUN: 13 mg/dL (ref 7–25)
CO2: 20 mmol/L (ref 20–32)
Calcium: 9.6 mg/dL (ref 8.6–10.4)
Chloride: 115 mmol/L — ABNORMAL HIGH (ref 98–110)
Creat: 0.71 mg/dL (ref 0.60–0.93)
GFR, Est African American: 95 mL/min/{1.73_m2} (ref >=60)
GFR, Est Non African American: 82 mL/min/{1.73_m2} (ref >=60)
Globulin: 3.1 g/dL (calc) (ref 1.9–3.7)
Glucose, Bld: 88 mg/dL (ref 65–99)
Potassium: 4 mmol/L (ref 3.5–5.3)
Sodium: 141 mmol/L (ref 135–146)
Total Bilirubin: 0.5 mg/dL (ref 0.2–1.2)
Total Protein: 7.2 g/dL (ref 6.1–8.1)

## 2020-03-24 LAB — HIV-1 RNA QUANT-NO REFLEX-BLD
HIV 1 RNA Quant: <20 Copies/mL
HIV-1 RNA Quant, Log: <1.3 Log cps/mL

## 2020-03-24 LAB — RPR: RPR Ser Ql: NONREACTIVE

## 2020-04-01 ENCOUNTER — Other Ambulatory Visit: Payer: Self-pay | Admitting: Internal Medicine

## 2020-04-01 ENCOUNTER — Other Ambulatory Visit: Payer: Self-pay | Admitting: Infectious Disease

## 2020-04-01 DIAGNOSIS — E785 Hyperlipidemia, unspecified: Secondary | ICD-10-CM

## 2020-04-01 DIAGNOSIS — I10 Essential (primary) hypertension: Secondary | ICD-10-CM

## 2020-04-09 ENCOUNTER — Other Ambulatory Visit: Payer: Self-pay

## 2020-04-09 ENCOUNTER — Ambulatory Visit (INDEPENDENT_AMBULATORY_CARE_PROVIDER_SITE_OTHER): Payer: Medicare Other | Admitting: Internal Medicine

## 2020-04-09 ENCOUNTER — Telehealth: Payer: Self-pay | Admitting: *Deleted

## 2020-04-09 DIAGNOSIS — Z1231 Encounter for screening mammogram for malignant neoplasm of breast: Secondary | ICD-10-CM | POA: Diagnosis not present

## 2020-04-09 DIAGNOSIS — K5903 Drug induced constipation: Secondary | ICD-10-CM | POA: Diagnosis not present

## 2020-04-09 DIAGNOSIS — F17200 Nicotine dependence, unspecified, uncomplicated: Secondary | ICD-10-CM | POA: Diagnosis not present

## 2020-04-09 DIAGNOSIS — M5136 Other intervertebral disc degeneration, lumbar region: Secondary | ICD-10-CM | POA: Diagnosis not present

## 2020-04-09 DIAGNOSIS — G8929 Other chronic pain: Secondary | ICD-10-CM | POA: Diagnosis not present

## 2020-04-09 DIAGNOSIS — R55 Syncope and collapse: Secondary | ICD-10-CM

## 2020-04-09 DIAGNOSIS — R519 Headache, unspecified: Secondary | ICD-10-CM

## 2020-04-09 DIAGNOSIS — Z1211 Encounter for screening for malignant neoplasm of colon: Secondary | ICD-10-CM

## 2020-04-09 DIAGNOSIS — H409 Unspecified glaucoma: Secondary | ICD-10-CM | POA: Diagnosis not present

## 2020-04-09 DIAGNOSIS — I10 Essential (primary) hypertension: Secondary | ICD-10-CM

## 2020-04-09 MED ORDER — NURTEC 75 MG PO TBDP: 75.0000 mg | ORAL_TABLET | ORAL | 3 refills | Status: DC

## 2020-04-09 MED ORDER — TOPIRAMATE 25 MG PO TABS: 50.0000 mg | ORAL_TABLET | Freq: Every day | ORAL | 3 refills | Status: DC

## 2020-04-09 NOTE — Assessment & Plan Note (Signed)
She continues to smoke and notes that it is one of her "only relaxing things."  She is precontemplative about cessation.  We discussed screening for lung cancer and she is amenable to start screening.    Plan CT scan for lung cancer screening.

## 2020-04-09 NOTE — Assessment & Plan Note (Signed)
She is taking quite a few drops and methazolamide for this.  Unfortunately, this interacts with Topamax and can lead to a metabolic acidosis.  Will plan to stop Topamax today.  See "migraine" problem.

## 2020-04-09 NOTE — Assessment & Plan Note (Signed)
Telehealth appointment today, she notes that her BP is "ok" at home.  She is taking her 3 medications as prescribed.  She is having headaches, but denies chest pain (See migraine problem).   Plan Continue Terazosin, amlodipine, benazapril

## 2020-04-09 NOTE — Assessment & Plan Note (Signed)
Migraines are occurring about 3X per week.  She has noticed only a small improvement with Topamax.  We are limited on going up on this medication due to interaction with methazolamide.  We will stop topamax today and start Nurtec (rimegepant) every other day to see if this helps.   Plan Stop Topamax Start Nurtec every other day.

## 2020-04-09 NOTE — Assessment & Plan Note (Signed)
No further episodes of passing out.  She does note continued intermittent episodes of diarrhea which we will attempt to treat with Imodium.  Further work up pending trial of this treatment modality.

## 2020-04-09 NOTE — Assessment & Plan Note (Signed)
She is taking tramadol without issue and this seems to be helping her.  She is due for a UDS and re-evaluation which we will do at next visit.

## 2020-04-09 NOTE — Telephone Encounter (Signed)
Call to pharmacy at pcp's request to d/c topamax rx.Regenia Skeeter, Kyla Duffy Cassady1/28/202211:28 AM

## 2020-04-09 NOTE — Progress Notes (Signed)
[facility] Internal Medicine Residency Telephone Encounter Continuity Care Appointment  HPI:   This telephone encounter was created for [name] on 04/09/2020 for the following purpose/cc f/u of HTN.  [name] is a 66 year odl woman with pMH of HTN, GAD, migraine headaches, HIV, HLD, LDD on tramadol, osteoporosis, tobacco use and recently intermittent chronic diarrhea who presented for a telehealth visit.  She was due to be seen in person, but could not leave her ailing sister.   She notes good blood pressure control, taking all of her medications as prescribed  She is due for a colonoscopy, mammogram and lung cancer screening which we will order today.  She is also due for a PAP smear, and we will discuss at next in person visit.   She noted diarrhea since being cleaned out for constipation.  She is not taking any constipation meds, but she notes that about 4-5X/month she will have days where she has diarrhea.  She will start in the morning, have 4-5 bowel movements, develop dizziness and have to crawl to and from her bed.  She once had syncope due to this and went to the ED.  She is not sure if there are any medications or foods that cause this.  She has not tried any therapy for it.    Past Medical History:  Past Medical History:  Diagnosis Date  . Acute renal failure (ARF) ([place]) 08/17/2017  . Anxiety 04/05/2012  . Bilateral sensorineural hearing loss 06/11/2014   Mild to moderate on the left side and slight to mild on the right side per audiometry 05/2014.  Hearing aides with possible masking of tinnitus recommended but patient wished to defer secondary to finances.  . Blood transfusion without reported diagnosis    pt denies  . Bursitis of right shoulder 07/12/2012   s/p shoulder injection 07/12/2012   . Cataract of right eye   . Constipation due to pain medication 04/27/2010  . Diverticulosis 02/08/2012   Extensive left-sided diverticula on colonoscopy March  2012 per Dr. Gala Romney   . Essential hypertension 07/20/2006  . Genital herpes 07/20/2006  . 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 Headache 10/20/2019  . Heart murmur 1961  . Human immunodeficiency virus disease ([place]) 03/27/1986  . Hyperlipidemia LDL goal < 100 04/05/2012  . Hypokalemia 04/12/2018  . Insomnia 10/20/2019  . Long-term current use of opiate analgesic 03/17/2016  . Loose stools 04/08/2019  . Lumbar degenerative disc disease 07/20/2006   With chronic back pain   . Marijuana use 07/03/2016  . Micturition syncope 09/20/2015  . Nausea and vomiting 04/08/2019  . Opiate dependence ([place]) 04/12/2018  . 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[place]) 11/01/2011   s/p aortobifem bypass 2009   . Periumbilical hernia 08/14/158   1 cm left periumbilical abdominal wall defect  . 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 Right rotator cuff tear 02/01/2013   Responds to periodic steroid injections  . Seborrhea 09/01/2010  . Shoulder pain, right 12/04/2017  . Small bowel obstruction due to adhesions ([place]) 02/08/2012   s/p Exploratory laparotomy, lysis of adhesions 02/12/12    . 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 Tobacco abuse 02/19/2012  . Tobacco abuse   . Vasovagal syncope 02/15/2015  . Vitamin D deficiency 05/29/2018   Vitamin D 18.96 (04/30/2018), treated with ergocalciferol 50,000 units PO QWk X 4 weeks  . Voiding dysfunction  s/p cystoscopy and meatal dilation Dec 2005      ROS:   Intermittent loose stools, dizziness, headaches   Assessment / Plan / Recommendations:   Please see A&P under problem oriented charting for assessment of the patient's acute and chronic medical conditions.   As always, pt is advised that if symptoms worsen or new symptoms arise, they should go to an urgent care facility or to to ER for further evaluation.   Consent and Medical Decision Making:   This is a telephone encounter between [place] and  Gilles Chiquito on 04/09/2020 for follow up of HTN. The visit was conducted with the patient located at home and Gilles Chiquito at [facility]. The patient's identity was confirmed using their DOB and current address. The patient has consented to being evaluated through a telephone encounter and understands the associated risks (an examination cannot be done and the patient may need to come in for an appointment) / benefits (allows the patient to remain at home, decreasing exposure to coronavirus). I personally spent 20 minutes on medical discussion.    Follow up in person in 3 months.

## 2020-04-09 NOTE — Assessment & Plan Note (Signed)
Interestingly, since she has had a clean out from her constipation issue, she is now having diarrhea.  This seems like functional diarrhea to me or possibly related to food as she is not having fever, chills and the diarrhea is for 1 day only, multiple times a month.   Plan On days that the diarrhea starts, she will take Imodium AD X 1 tablet only and see if this helps avoid the further episodes.  She does have the risk of worsening constipation, so I advised her to avoid taking more than 1 tablet.

## 2020-04-13 ENCOUNTER — Telehealth: Payer: Self-pay | Admitting: *Deleted

## 2020-04-13 NOTE — Telephone Encounter (Signed)
Call to [employer] for PA for Nurtec.  Information was given,  Approved 04/13/2020 thru 03/12/2021.  [place] [id number].  Sander Nephew, RN 04/13/2020 11:53 AM.

## 2020-04-22 ENCOUNTER — Encounter: Payer: Self-pay | Admitting: *Deleted

## 2020-04-22 IMAGING — US US RENAL
1 series · 14 of 25 positions shown · non-contrast
Comparison: Chest CT 08/17/2017.

CLINICAL DATA: 77-year-old female with acute renal failure.

EXAM:
RENAL / URINARY TRACT ULTRASOUND COMPLETE

[Series 1: us renal · 0.22mm/px · 14 of 30 slices shown]
[im 1/30]
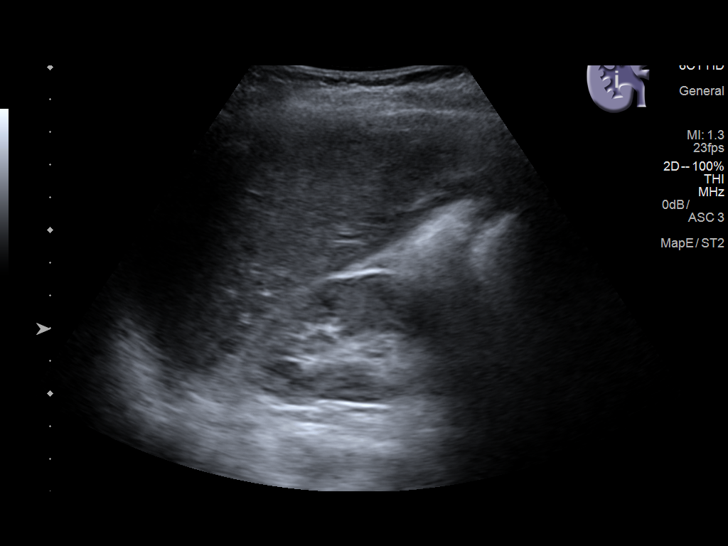
[im 3/30]
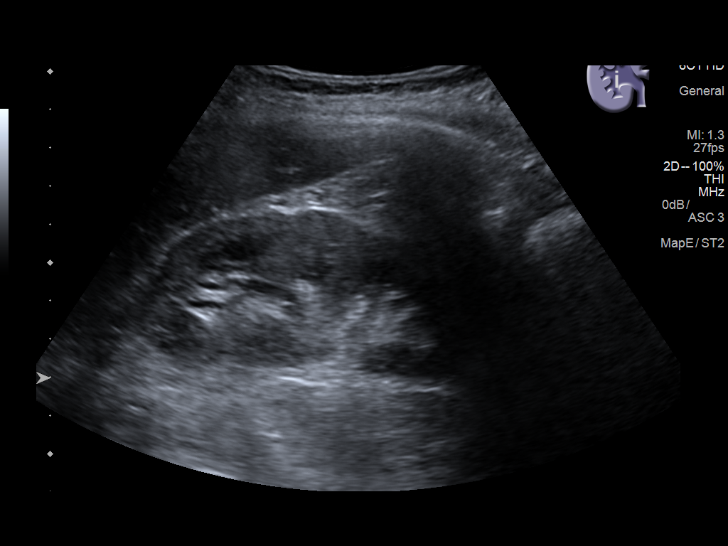
[im 5/30]
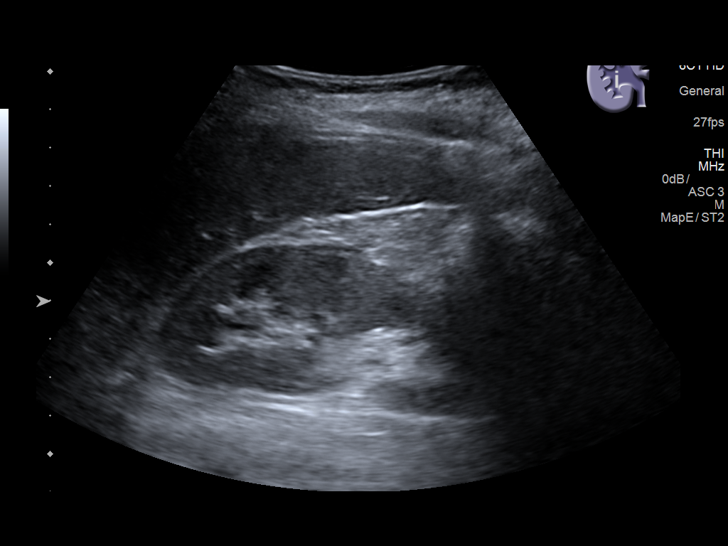
[im 8/30]
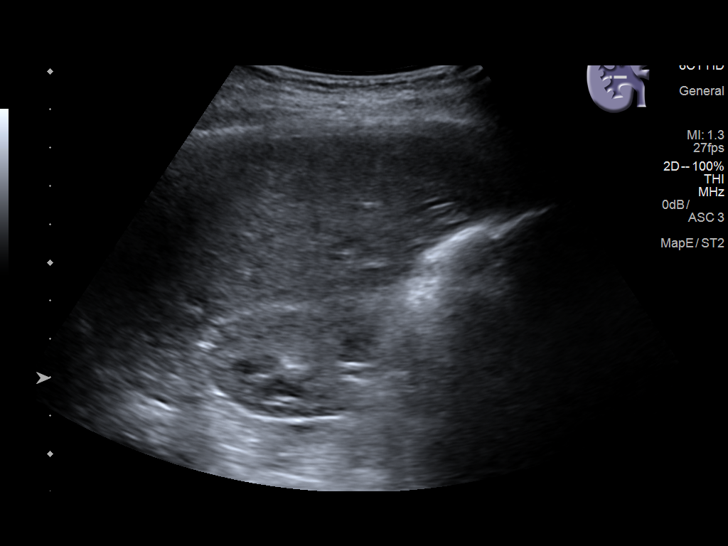
[im 10/30]
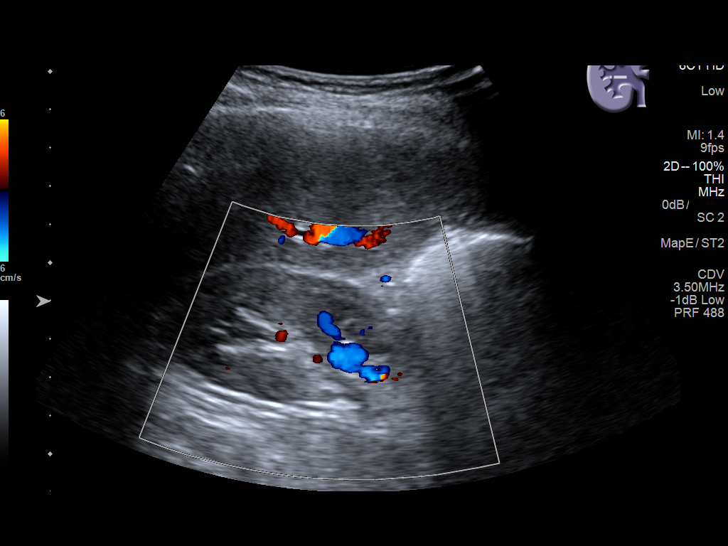
[im 11/30]
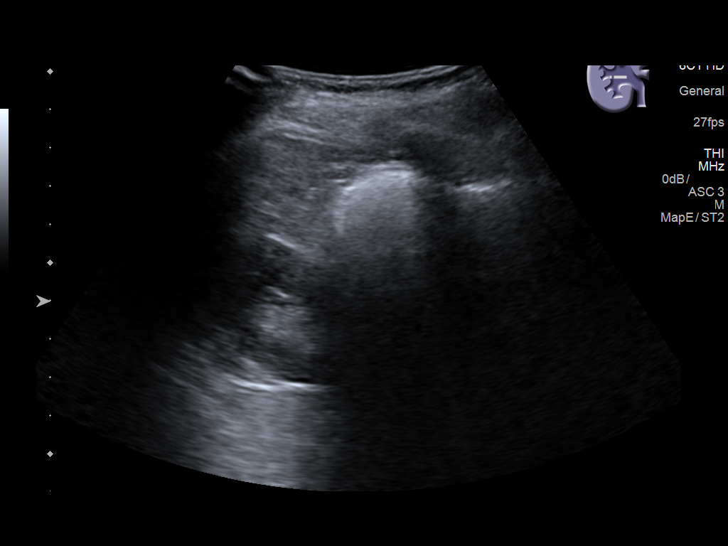
[im 14/30]
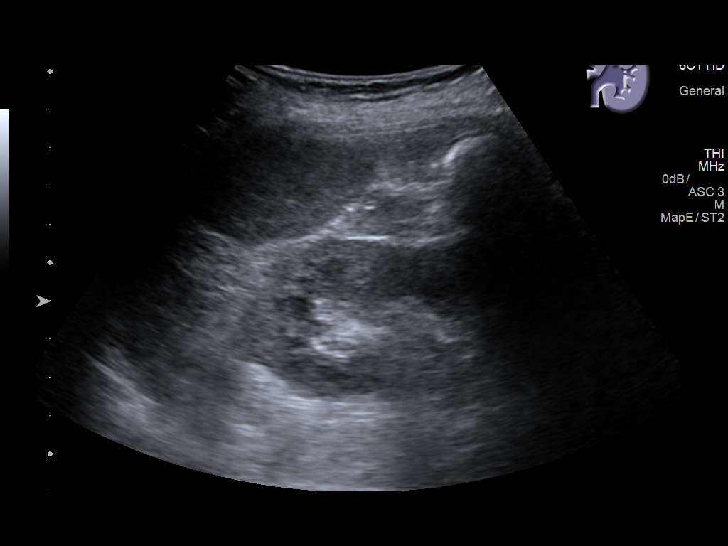
[im 16/30]
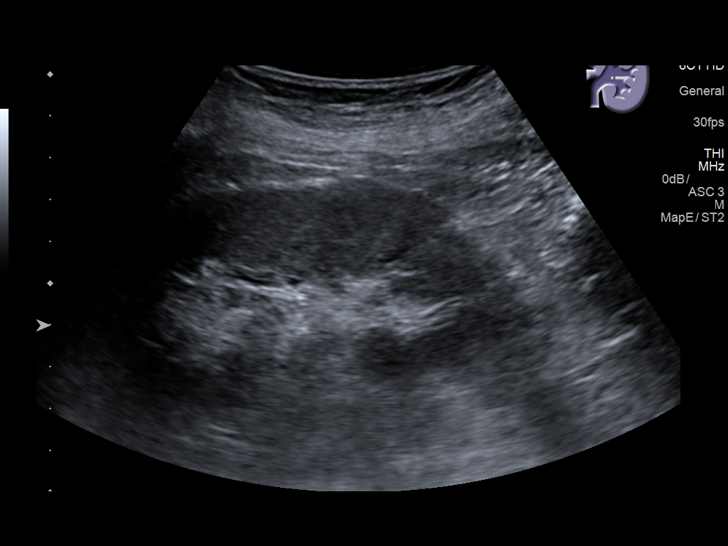
[im 19/30]
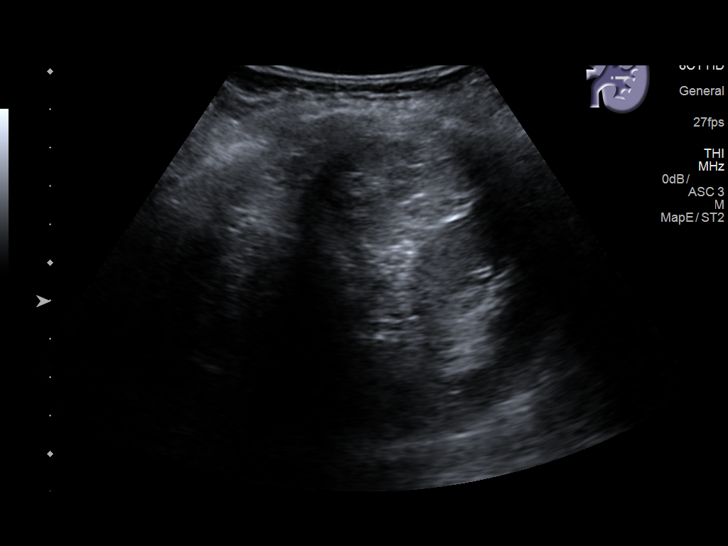
[im 20/30]
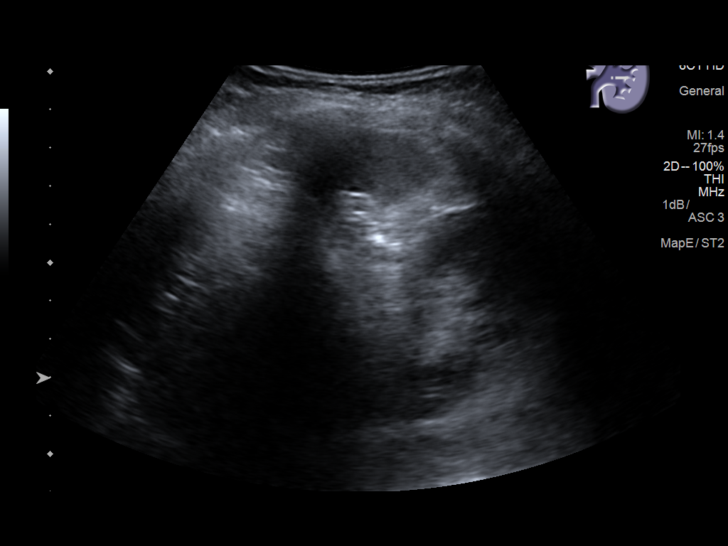
[im 22/30]
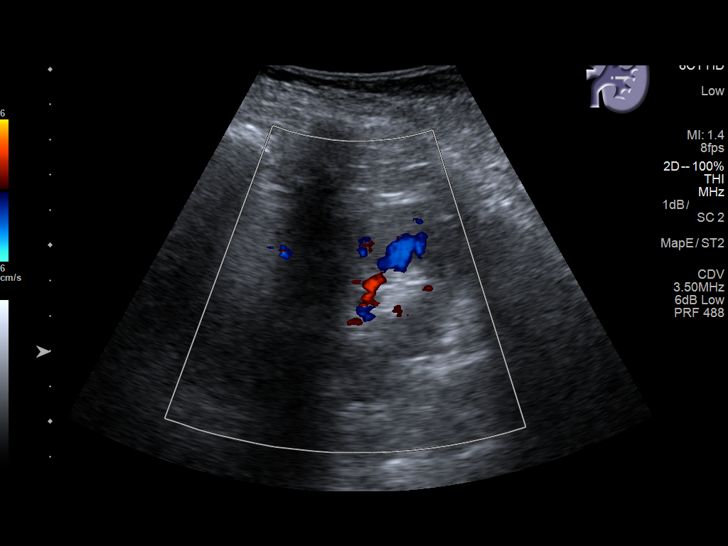
[im 25/30]
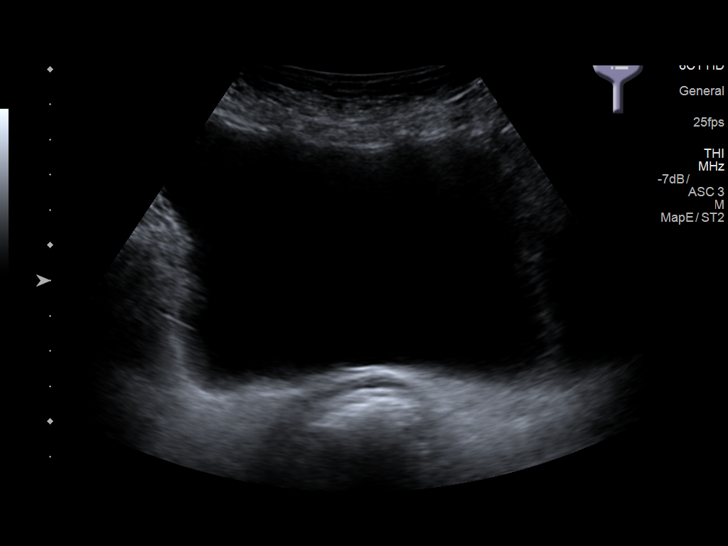
[im 27/30]
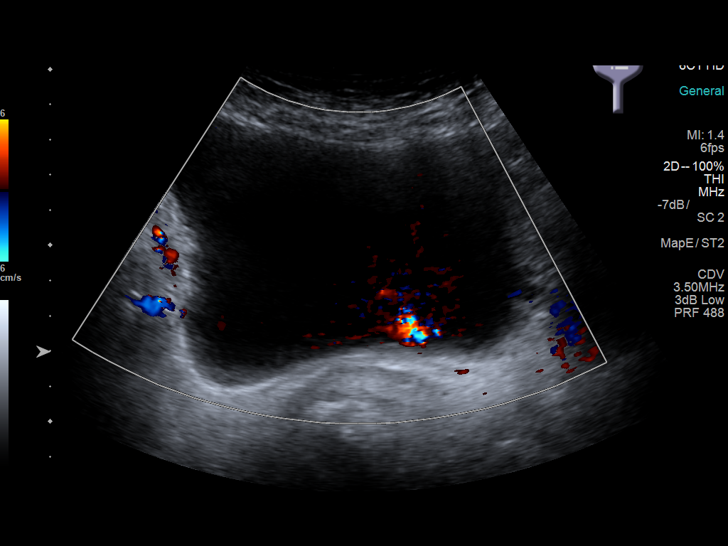
[im 30/30]
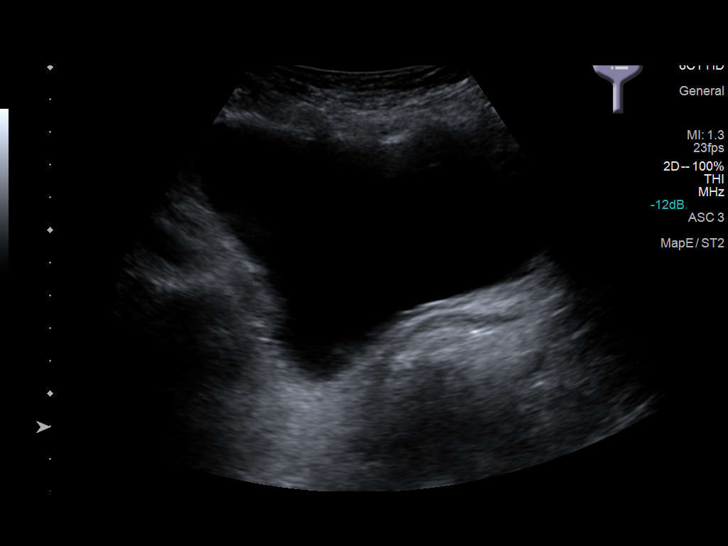

[14 of 25 positions shown; findings below may reference images not displayed]

FINDINGS: Right Kidney:

Length: 8.6 centimeters. Renal cortical echogenicity appears
increased (images 11 and 14). Minimal prominence of the right
intrarenal collecting system but no hydronephrosis. No right renal
mass or lesion.

Left Kidney:

Length: 9.8 centimeters. Left renal cortical thickness and
echogenicity may be better preserved. No left hydronephrosis or
renal mass.

Bladder:

Appears normal for degree of bladder distention. The left ureteral
jet was detected with Doppler although the right was not.
IMPRESSION: 1. No definite acute renal finding - minimal asymmetric prominence
of the right renal collecting system without overt hydronephrosis.
2. Suspicion of chronic medical renal disease.

## 2020-04-28 ENCOUNTER — Other Ambulatory Visit: Payer: Self-pay | Admitting: Internal Medicine

## 2020-04-28 DIAGNOSIS — F411 Generalized anxiety disorder: Secondary | ICD-10-CM

## 2020-05-03 DIAGNOSIS — H401133 Primary open-angle glaucoma, bilateral, severe stage: Secondary | ICD-10-CM | POA: Diagnosis not present

## 2020-05-13 ENCOUNTER — Other Ambulatory Visit: Payer: Self-pay | Admitting: *Deleted

## 2020-05-13 DIAGNOSIS — M810 Age-related osteoporosis without current pathological fracture: Secondary | ICD-10-CM

## 2020-05-13 MED ORDER — ALENDRONATE SODIUM 70 MG PO TABS: 70.0000 mg | ORAL_TABLET | ORAL | 3 refills | Status: DC

## 2020-05-18 ENCOUNTER — Ambulatory Visit (HOSPITAL_COMMUNITY): Payer: Medicare Other | Attending: Internal Medicine

## 2020-05-28 ENCOUNTER — Telehealth: Payer: Self-pay | Admitting: *Deleted

## 2020-05-28 NOTE — Telephone Encounter (Signed)
Call from [facility] who received a call from the pt that her doctor told her she could take 2 tabs of Terazosin at bedtime (instead of one tab)  but they do not a rx for ths.  I called Ms Collene Mares - she stated her daughter called in the wrong medication; she was requesting an increase dose for her eye pill. Stated they have a call in to her eye doctor.

## 2020-06-02 ENCOUNTER — Other Ambulatory Visit: Payer: Self-pay | Admitting: Infectious Disease

## 2020-06-02 DIAGNOSIS — E785 Hyperlipidemia, unspecified: Secondary | ICD-10-CM

## 2020-06-07 ENCOUNTER — Other Ambulatory Visit: Payer: Self-pay | Admitting: Infectious Disease

## 2020-06-07 DIAGNOSIS — B20 Human immunodeficiency virus [HIV] disease: Secondary | ICD-10-CM

## 2020-06-11 DIAGNOSIS — H401133 Primary open-angle glaucoma, bilateral, severe stage: Secondary | ICD-10-CM | POA: Diagnosis not present

## 2020-06-15 ENCOUNTER — Other Ambulatory Visit: Payer: Self-pay | Admitting: Internal Medicine

## 2020-06-15 DIAGNOSIS — B029 Zoster without complications: Secondary | ICD-10-CM

## 2020-06-17 ENCOUNTER — Other Ambulatory Visit: Payer: Self-pay

## 2020-06-17 DIAGNOSIS — I10 Essential (primary) hypertension: Secondary | ICD-10-CM

## 2020-06-17 MED ORDER — BENAZEPRIL HCL 40 MG PO TABS: 40.0000 mg | ORAL_TABLET | Freq: Every day | ORAL | 3 refills | Status: DC

## 2020-06-17 NOTE — Telephone Encounter (Signed)
Peggie with [facility] drug requesting refill on benazepril (LOTENSIN) 40 MG tablet.

## 2020-06-28 ENCOUNTER — Other Ambulatory Visit: Payer: Self-pay | Admitting: Internal Medicine

## 2020-06-28 DIAGNOSIS — F411 Generalized anxiety disorder: Secondary | ICD-10-CM

## 2020-06-29 DIAGNOSIS — Z79899 Other long term (current) drug therapy: Secondary | ICD-10-CM | POA: Diagnosis not present

## 2020-06-29 DIAGNOSIS — N189 Chronic kidney disease, unspecified: Secondary | ICD-10-CM | POA: Diagnosis not present

## 2020-06-29 DIAGNOSIS — H4010X3 Unspecified open-angle glaucoma, severe stage: Secondary | ICD-10-CM | POA: Diagnosis not present

## 2020-06-29 DIAGNOSIS — H401130 Primary open-angle glaucoma, bilateral, stage unspecified: Secondary | ICD-10-CM | POA: Diagnosis not present

## 2020-06-29 DIAGNOSIS — I129 Hypertensive chronic kidney disease with stage 1 through stage 4 chronic kidney disease, or unspecified chronic kidney disease: Secondary | ICD-10-CM | POA: Diagnosis not present

## 2020-06-29 DIAGNOSIS — Z7982 Long term (current) use of aspirin: Secondary | ICD-10-CM | POA: Diagnosis not present

## 2020-06-29 DIAGNOSIS — H4089 Other specified glaucoma: Secondary | ICD-10-CM | POA: Diagnosis not present

## 2020-06-29 DIAGNOSIS — H401113 Primary open-angle glaucoma, right eye, severe stage: Secondary | ICD-10-CM | POA: Diagnosis not present

## 2020-06-29 DIAGNOSIS — Z8679 Personal history of other diseases of the circulatory system: Secondary | ICD-10-CM | POA: Diagnosis not present

## 2020-06-29 DIAGNOSIS — H2511 Age-related nuclear cataract, right eye: Secondary | ICD-10-CM | POA: Diagnosis not present

## 2020-06-29 DIAGNOSIS — F17210 Nicotine dependence, cigarettes, uncomplicated: Secondary | ICD-10-CM | POA: Diagnosis not present

## 2020-06-29 DIAGNOSIS — I739 Peripheral vascular disease, unspecified: Secondary | ICD-10-CM | POA: Diagnosis not present

## 2020-06-29 DIAGNOSIS — Z7983 Long term (current) use of bisphosphonates: Secondary | ICD-10-CM | POA: Diagnosis not present

## 2020-06-29 HISTORY — PX: OTHER SURGICAL HISTORY: SHX169

## 2020-07-05 ENCOUNTER — Other Ambulatory Visit: Payer: Self-pay | Admitting: Internal Medicine

## 2020-07-05 DIAGNOSIS — R21 Rash and other nonspecific skin eruption: Secondary | ICD-10-CM

## 2020-07-06 ENCOUNTER — Ambulatory Visit (AMBULATORY_SURGERY_CENTER): Payer: Self-pay | Admitting: *Deleted

## 2020-07-06 ENCOUNTER — Encounter: Payer: Self-pay | Admitting: Gastroenterology

## 2020-07-06 ENCOUNTER — Other Ambulatory Visit: Payer: Self-pay

## 2020-07-06 VITALS — Ht 67.0 in | Wt 112.0 lb

## 2020-07-06 DIAGNOSIS — Z1211 Encounter for screening for malignant neoplasm of colon: Secondary | ICD-10-CM

## 2020-07-06 MED ORDER — SUPREP BOWEL PREP KIT 17.5-3.13-1.6 GM/177ML PO SOLN: 1.0000 | Freq: Once | ORAL | 0 refills | Status: AC

## 2020-07-06 NOTE — Progress Notes (Signed)
No egg or soy allergy known to patient  No issues with past sedation with any surgeries or procedures Patient denies ever being told they had issues or difficulty with intubation  No FH of Malignant Hyperthermia No diet pills per patient No home 02 use per patient  No blood thinners per patient  Pt denies issues with constipation- NO HARD BM'S SINCE STARTED FIBER DAILY   No A fib or A flutter  EMMI video to pt or via [facility] 19 guidelines implemented in PV today with Pt and RN  Pt is fully vaccinated  for Covid    Due to the COVID-19 pandemic we are asking patients to follow certain guidelines.  Pt aware of COVID protocols and [facility] guidelines

## 2020-07-10 ENCOUNTER — Emergency Department (HOSPITAL_COMMUNITY): Payer: Medicare Other | Admitting: Anesthesiology

## 2020-07-10 ENCOUNTER — Encounter (HOSPITAL_COMMUNITY)
Admission: EM | Disposition: A | Discharge status: Skilled Nursing Facility | Payer: Self-pay | Source: Ambulatory Visit | Attending: Surgery

## 2020-07-10 ENCOUNTER — Emergency Department (HOSPITAL_COMMUNITY): Payer: Medicare Other

## 2020-07-10 ENCOUNTER — Inpatient Hospital Stay (HOSPITAL_COMMUNITY)
Admission: EM | Admit: 2020-07-10 | Discharge: 2020-07-13 | DRG: 253 | Disposition: A | Discharge status: Skilled Nursing Facility | Payer: Medicare Other | Source: Ambulatory Visit | Attending: Surgery | Admitting: Surgery

## 2020-07-10 DIAGNOSIS — F17210 Nicotine dependence, cigarettes, uncomplicated: Secondary | ICD-10-CM | POA: Diagnosis present

## 2020-07-10 DIAGNOSIS — M79662 Pain in left lower leg: Secondary | ICD-10-CM | POA: Diagnosis not present

## 2020-07-10 DIAGNOSIS — B20 Human immunodeficiency virus [HIV] disease: Secondary | ICD-10-CM | POA: Diagnosis present

## 2020-07-10 DIAGNOSIS — R5381 Other malaise: Secondary | ICD-10-CM | POA: Diagnosis not present

## 2020-07-10 DIAGNOSIS — R262 Difficulty in walking, not elsewhere classified: Secondary | ICD-10-CM | POA: Diagnosis not present

## 2020-07-10 DIAGNOSIS — I743 Embolism and thrombosis of arteries of the lower extremities: Secondary | ICD-10-CM | POA: Diagnosis not present

## 2020-07-10 DIAGNOSIS — M6281 Muscle weakness (generalized): Secondary | ICD-10-CM | POA: Diagnosis not present

## 2020-07-10 DIAGNOSIS — Y712 Prosthetic and other implants, materials and accessory cardiovascular devices associated with adverse incidents: Secondary | ICD-10-CM | POA: Diagnosis present

## 2020-07-10 DIAGNOSIS — Z743 Need for continuous supervision: Secondary | ICD-10-CM | POA: Diagnosis not present

## 2020-07-10 DIAGNOSIS — E7800 Pure hypercholesterolemia, unspecified: Secondary | ICD-10-CM | POA: Diagnosis not present

## 2020-07-10 DIAGNOSIS — M549 Dorsalgia, unspecified: Secondary | ICD-10-CM | POA: Diagnosis not present

## 2020-07-10 DIAGNOSIS — Z90710 Acquired absence of both cervix and uterus: Secondary | ICD-10-CM

## 2020-07-10 DIAGNOSIS — M2550 Pain in unspecified joint: Secondary | ICD-10-CM | POA: Diagnosis not present

## 2020-07-10 DIAGNOSIS — Z7983 Long term (current) use of bisphosphonates: Secondary | ICD-10-CM | POA: Diagnosis not present

## 2020-07-10 DIAGNOSIS — Z83511 Family history of glaucoma: Secondary | ICD-10-CM

## 2020-07-10 DIAGNOSIS — R54 Age-related physical debility: Secondary | ICD-10-CM | POA: Diagnosis present

## 2020-07-10 DIAGNOSIS — R0989 Other specified symptoms and signs involving the circulatory and respiratory systems: Secondary | ICD-10-CM | POA: Diagnosis present

## 2020-07-10 DIAGNOSIS — E785 Hyperlipidemia, unspecified: Secondary | ICD-10-CM | POA: Diagnosis not present

## 2020-07-10 DIAGNOSIS — Z7982 Long term (current) use of aspirin: Secondary | ICD-10-CM | POA: Diagnosis not present

## 2020-07-10 DIAGNOSIS — Z95820 Peripheral vascular angioplasty status with implants and grafts: Secondary | ICD-10-CM

## 2020-07-10 DIAGNOSIS — Z9889 Other specified postprocedural states: Secondary | ICD-10-CM

## 2020-07-10 DIAGNOSIS — R296 Repeated falls: Secondary | ICD-10-CM | POA: Diagnosis present

## 2020-07-10 DIAGNOSIS — E876 Hypokalemia: Secondary | ICD-10-CM | POA: Diagnosis not present

## 2020-07-10 DIAGNOSIS — M810 Age-related osteoporosis without current pathological fracture: Secondary | ICD-10-CM | POA: Diagnosis present

## 2020-07-10 DIAGNOSIS — E559 Vitamin D deficiency, unspecified: Secondary | ICD-10-CM | POA: Diagnosis present

## 2020-07-10 DIAGNOSIS — Z841 Family history of disorders of kidney and ureter: Secondary | ICD-10-CM | POA: Diagnosis not present

## 2020-07-10 DIAGNOSIS — I340 Nonrheumatic mitral (valve) insufficiency: Secondary | ICD-10-CM | POA: Diagnosis not present

## 2020-07-10 DIAGNOSIS — I10 Essential (primary) hypertension: Secondary | ICD-10-CM | POA: Diagnosis present

## 2020-07-10 DIAGNOSIS — I351 Nonrheumatic aortic (valve) insufficiency: Secondary | ICD-10-CM | POA: Diagnosis not present

## 2020-07-10 DIAGNOSIS — H409 Unspecified glaucoma: Secondary | ICD-10-CM | POA: Diagnosis present

## 2020-07-10 DIAGNOSIS — I739 Peripheral vascular disease, unspecified: Secondary | ICD-10-CM | POA: Diagnosis not present

## 2020-07-10 DIAGNOSIS — G8929 Other chronic pain: Secondary | ICD-10-CM | POA: Diagnosis present

## 2020-07-10 DIAGNOSIS — Z8249 Family history of ischemic heart disease and other diseases of the circulatory system: Secondary | ICD-10-CM | POA: Diagnosis not present

## 2020-07-10 DIAGNOSIS — Z20822 Contact with and (suspected) exposure to covid-19: Secondary | ICD-10-CM | POA: Diagnosis not present

## 2020-07-10 DIAGNOSIS — T82868A Thrombosis of vascular prosthetic devices, implants and grafts, initial encounter: Principal | ICD-10-CM | POA: Diagnosis present

## 2020-07-10 DIAGNOSIS — R278 Other lack of coordination: Secondary | ICD-10-CM | POA: Diagnosis not present

## 2020-07-10 DIAGNOSIS — R6889 Other general symptoms and signs: Secondary | ICD-10-CM | POA: Diagnosis not present

## 2020-07-10 DIAGNOSIS — R55 Syncope and collapse: Secondary | ICD-10-CM | POA: Diagnosis not present

## 2020-07-10 DIAGNOSIS — I745 Embolism and thrombosis of iliac artery: Secondary | ICD-10-CM | POA: Diagnosis not present

## 2020-07-10 DIAGNOSIS — Z751 Person awaiting admission to adequate facility elsewhere: Secondary | ICD-10-CM

## 2020-07-10 DIAGNOSIS — R404 Transient alteration of awareness: Secondary | ICD-10-CM | POA: Diagnosis not present

## 2020-07-10 DIAGNOSIS — I998 Other disorder of circulatory system: Secondary | ICD-10-CM | POA: Diagnosis not present

## 2020-07-10 DIAGNOSIS — H903 Sensorineural hearing loss, bilateral: Secondary | ICD-10-CM | POA: Diagnosis not present

## 2020-07-10 DIAGNOSIS — J329 Chronic sinusitis, unspecified: Secondary | ICD-10-CM | POA: Diagnosis not present

## 2020-07-10 DIAGNOSIS — I5189 Other ill-defined heart diseases: Secondary | ICD-10-CM | POA: Diagnosis not present

## 2020-07-10 DIAGNOSIS — R0689 Other abnormalities of breathing: Secondary | ICD-10-CM | POA: Diagnosis not present

## 2020-07-10 DIAGNOSIS — Z79899 Other long term (current) drug therapy: Secondary | ICD-10-CM

## 2020-07-10 DIAGNOSIS — Z833 Family history of diabetes mellitus: Secondary | ICD-10-CM | POA: Diagnosis not present

## 2020-07-10 DIAGNOSIS — Z888 Allergy status to other drugs, medicaments and biological substances status: Secondary | ICD-10-CM

## 2020-07-10 DIAGNOSIS — I771 Stricture of artery: Secondary | ICD-10-CM | POA: Diagnosis not present

## 2020-07-10 DIAGNOSIS — M62262 Nontraumatic ischemic infarction of muscle, left lower leg: Secondary | ICD-10-CM | POA: Diagnosis not present

## 2020-07-10 DIAGNOSIS — R9431 Abnormal electrocardiogram [ECG] [EKG]: Secondary | ICD-10-CM | POA: Diagnosis not present

## 2020-07-10 DIAGNOSIS — F411 Generalized anxiety disorder: Secondary | ICD-10-CM | POA: Diagnosis present

## 2020-07-10 DIAGNOSIS — I7092 Chronic total occlusion of artery of the extremities: Secondary | ICD-10-CM | POA: Diagnosis not present

## 2020-07-10 DIAGNOSIS — Z7401 Bed confinement status: Secondary | ICD-10-CM | POA: Diagnosis not present

## 2020-07-10 HISTORY — PX: THROMBECTOMY FEMORAL ARTERY: SHX6406

## 2020-07-10 HISTORY — PX: FEMORAL ARTERY EXPLORATION: SHX5160

## 2020-07-10 LAB — CBC WITH DIFFERENTIAL/PLATELET
Abs Immature Granulocytes: 0.04 10*3/uL (ref 0.00–0.07)
Basophils Absolute: 0 10*3/uL (ref 0.0–0.1)
Basophils Relative: 0 %
Eosinophils Absolute: 0 10*3/uL (ref 0.0–0.5)
Eosinophils Relative: 0 %
HCT: 41 % (ref 36.0–46.0)
Hemoglobin: 13.7 g/dL (ref 12.0–15.0)
Immature Granulocytes: 0 %
Lymphocytes Relative: 12 %
Lymphs Abs: 1.2 10*3/uL (ref 0.7–4.0)
MCH: 33.2 pg (ref 26.0–34.0)
MCHC: 33.4 g/dL (ref 30.0–36.0)
MCV: 99.3 fL (ref 80.0–100.0)
Monocytes Absolute: 0.6 10*3/uL (ref 0.1–1.0)
Monocytes Relative: 6 %
Neutro Abs: 8.1 10*3/uL — ABNORMAL HIGH (ref 1.7–7.7)
Neutrophils Relative %: 82 %
Platelets: 199 10*3/uL (ref 150–400)
RBC: 4.13 MIL/uL (ref 3.87–5.11)
RDW: 13.2 % (ref 11.5–15.5)
WBC: 10 10*3/uL (ref 4.0–10.5)
nRBC: 0 % (ref 0.0–0.2)

## 2020-07-10 LAB — PROTIME-INR
INR: 1.2 (ref 0.8–1.2)
Prothrombin Time: 14.7 seconds (ref 11.4–15.2)

## 2020-07-10 LAB — RESP PANEL BY RT-PCR (FLU A&B, COVID) ARPGX2
Influenza A by PCR: NEGATIVE
Influenza B by PCR: NEGATIVE
SARS Coronavirus 2 by RT PCR: NEGATIVE

## 2020-07-10 LAB — BASIC METABOLIC PANEL
Anion gap: 10 (ref 5–15)
BUN: 16 mg/dL (ref 8–23)
CO2: 19 mmol/L — ABNORMAL LOW (ref 22–32)
Calcium: 9.4 mg/dL (ref 8.9–10.3)
Chloride: 112 mmol/L — ABNORMAL HIGH (ref 98–111)
Creatinine, Ser: 1.01 mg/dL — ABNORMAL HIGH (ref 0.44–1.00)
GFR, Estimated: 57 mL/min — ABNORMAL LOW (ref >60)
Glucose, Bld: 125 mg/dL — ABNORMAL HIGH (ref 70–99)
Potassium: 3.5 mmol/L (ref 3.5–5.1)
Sodium: 141 mmol/L (ref 135–145)

## 2020-07-10 SURGERY — THROMBECTOMY, ARTERY, FEMORAL
Anesthesia: General | Site: Leg Upper | Laterality: Left

## 2020-07-10 MED ORDER — SODIUM CHLORIDE 0.9 % IV SOLN
INTRAVENOUS | Status: DC | PRN
  Administered 2020-07-10: 500 mL

## 2020-07-10 MED ORDER — LACTATED RINGERS IV SOLN: INTRAVENOUS | Status: DC | PRN

## 2020-07-10 MED ORDER — HEPARIN (PORCINE) 25000 UT/250ML-% IV SOLN
950.0000 [IU]/h | INTRAVENOUS | Status: DC
  Administered 2020-07-10: 950 [IU]/h via INTRAVENOUS
  Filled 2020-07-10: qty 250

## 2020-07-10 MED ORDER — PHENYLEPHRINE HCL-NACL 10-0.9 MG/250ML-% IV SOLN
INTRAVENOUS | Status: DC | PRN
  Administered 2020-07-10: 25 ug/min via INTRAVENOUS

## 2020-07-10 MED ORDER — SODIUM CHLORIDE 0.9 % IV BOLUS: 1000.0000 mL | Freq: Once | INTRAVENOUS | Status: DC

## 2020-07-10 MED ORDER — DEXAMETHASONE SODIUM PHOSPHATE 10 MG/ML IJ SOLN
INTRAMUSCULAR | Status: DC | PRN
  Administered 2020-07-10: 5 mg via INTRAVENOUS

## 2020-07-10 MED ORDER — ESMOLOL HCL 100 MG/10ML IV SOLN
INTRAVENOUS | Status: DC | PRN
  Administered 2020-07-10 (×2): 25 mg via INTRAVENOUS

## 2020-07-10 MED ORDER — MORPHINE SULFATE (PF) 4 MG/ML IV SOLN
4.0000 mg | Freq: Once | INTRAVENOUS | Status: AC
  Administered 2020-07-10: 4 mg via INTRAVENOUS
  Filled 2020-07-10: qty 1

## 2020-07-10 MED ORDER — LABETALOL HCL 5 MG/ML IV SOLN
INTRAVENOUS | Status: DC | PRN
  Administered 2020-07-10: 2.5 mg via INTRAVENOUS

## 2020-07-10 MED ORDER — PROPOFOL 10 MG/ML IV BOLUS
INTRAVENOUS | Status: AC
  Filled 2020-07-10: qty 20

## 2020-07-10 MED ORDER — HEPARIN SODIUM (PORCINE) 1000 UNIT/ML IJ SOLN
INTRAMUSCULAR | Status: DC | PRN
  Administered 2020-07-10: 5000 [IU] via INTRAVENOUS

## 2020-07-10 MED ORDER — FENTANYL CITRATE (PF) 250 MCG/5ML IJ SOLN
INTRAMUSCULAR | Status: AC
  Filled 2020-07-10: qty 5

## 2020-07-10 MED ORDER — PHENYLEPHRINE 40 MCG/ML (10ML) SYRINGE FOR IV PUSH (FOR BLOOD PRESSURE SUPPORT)
PREFILLED_SYRINGE | INTRAVENOUS | Status: DC | PRN
  Administered 2020-07-10 (×2): 120 ug via INTRAVENOUS
  Administered 2020-07-10: 80 ug via INTRAVENOUS

## 2020-07-10 MED ORDER — IOHEXOL 350 MG/ML SOLN
125.0000 mL | Freq: Once | INTRAVENOUS | Status: AC | PRN
  Administered 2020-07-10: 125 mL via INTRAVENOUS

## 2020-07-10 MED ORDER — HEPARIN BOLUS VIA INFUSION
3800.0000 [IU] | Freq: Once | INTRAVENOUS | Status: AC
  Administered 2020-07-10: 3800 [IU] via INTRAVENOUS
  Filled 2020-07-10: qty 3800

## 2020-07-10 MED ORDER — PROPOFOL 10 MG/ML IV BOLUS
INTRAVENOUS | Status: DC | PRN
  Administered 2020-07-10: 50 mg via INTRAVENOUS
  Administered 2020-07-10: 150 mg via INTRAVENOUS

## 2020-07-10 MED ORDER — SODIUM CHLORIDE 0.9 % IV SOLN
INTRAVENOUS | Status: AC
  Filled 2020-07-10: qty 1.2

## 2020-07-10 MED ORDER — ESMOLOL HCL 100 MG/10ML IV SOLN
INTRAVENOUS | Status: AC
  Filled 2020-07-10: qty 10

## 2020-07-10 MED ORDER — FENTANYL CITRATE (PF) 250 MCG/5ML IJ SOLN
INTRAMUSCULAR | Status: DC | PRN
  Administered 2020-07-10: 250 ug via INTRAVENOUS

## 2020-07-10 MED ORDER — EPHEDRINE SULFATE-NACL 50-0.9 MG/10ML-% IV SOSY
PREFILLED_SYRINGE | INTRAVENOUS | Status: DC | PRN
  Administered 2020-07-10 (×5): 5 mg via INTRAVENOUS
  Administered 2020-07-10: 10 mg via INTRAVENOUS

## 2020-07-10 MED ORDER — SUCCINYLCHOLINE CHLORIDE 200 MG/10ML IV SOSY
PREFILLED_SYRINGE | INTRAVENOUS | Status: DC | PRN
  Administered 2020-07-10: 60 mg via INTRAVENOUS

## 2020-07-10 MED ORDER — PAPAVERINE HCL 30 MG/ML IJ SOLN
INTRAMUSCULAR | Status: AC
  Filled 2020-07-10: qty 2

## 2020-07-10 MED ORDER — LIDOCAINE 2% (20 MG/ML) 5 ML SYRINGE
INTRAMUSCULAR | Status: DC | PRN
  Administered 2020-07-10: 60 mg via INTRAVENOUS

## 2020-07-10 MED ORDER — 0.9 % SODIUM CHLORIDE (POUR BTL) OPTIME
TOPICAL | Status: DC | PRN
  Administered 2020-07-10: 2000 mL

## 2020-07-10 MED ORDER — ONDANSETRON HCL 4 MG/2ML IJ SOLN
INTRAMUSCULAR | Status: DC | PRN
  Administered 2020-07-10: 4 mg via INTRAVENOUS

## 2020-07-10 MED ORDER — PROTAMINE SULFATE 10 MG/ML IV SOLN
INTRAVENOUS | Status: DC | PRN
  Administered 2020-07-10: 10 mg via INTRAVENOUS
  Administered 2020-07-10: 30 mg via INTRAVENOUS
  Administered 2020-07-10: 10 mg via INTRAVENOUS

## 2020-07-10 MED ORDER — POTASSIUM CHLORIDE 10 MEQ/100ML IV SOLN: 10.0000 meq | INTRAVENOUS | Status: AC

## 2020-07-10 MED ORDER — SUGAMMADEX SODIUM 200 MG/2ML IV SOLN
INTRAVENOUS | Status: DC | PRN
  Administered 2020-07-10: 150 mg via INTRAVENOUS

## 2020-07-10 MED ORDER — ALBUMIN HUMAN 5 % IV SOLN: INTRAVENOUS | Status: DC | PRN

## 2020-07-10 MED ORDER — CEFAZOLIN SODIUM-DEXTROSE 1-4 GM/50ML-% IV SOLN
INTRAVENOUS | Status: DC | PRN
  Administered 2020-07-10: 1 g via INTRAVENOUS

## 2020-07-10 MED ORDER — ROCURONIUM BROMIDE 10 MG/ML (PF) SYRINGE
PREFILLED_SYRINGE | INTRAVENOUS | Status: DC | PRN
  Administered 2020-07-10: 40 mg via INTRAVENOUS

## 2020-07-10 MED ORDER — HEMOSTATIC AGENTS (NO CHARGE) OPTIME
TOPICAL | Status: DC | PRN
  Administered 2020-07-10: 1 via TOPICAL

## 2020-07-10 SURGICAL SUPPLY — 61 items
ADH SKN CLS APL DERMABOND .7 (GAUZE/BANDAGES/DRESSINGS) ×1
AGENT HMST KT MTR STRL THRMB (HEMOSTASIS) ×1
BANDAGE ESMARK 6X9 LF (GAUZE/BANDAGES/DRESSINGS) IMPLANT
BNDG CMPR 9X6 STRL LF SNTH (GAUZE/BANDAGES/DRESSINGS)
BNDG ELASTIC 4X5.8 VLCR STR LF (GAUZE/BANDAGES/DRESSINGS) IMPLANT
BNDG ESMARK 6X9 LF (GAUZE/BANDAGES/DRESSINGS)
CANISTER SUCT 3000ML PPV (MISCELLANEOUS) ×2 IMPLANT
CATH EMB 4FR 80CM (CATHETERS) ×2 IMPLANT
CATH EMB 5FR 80CM (CATHETERS) ×2 IMPLANT
CATH FOLEY LATEX FREE 16FR (CATHETERS) ×2
CATH FOLEY LF 16FR (CATHETERS) ×1 IMPLANT
CLIP VESOCCLUDE MED 24/CT (CLIP) ×2 IMPLANT
CLIP VESOCCLUDE SM WIDE 24/CT (CLIP) ×2 IMPLANT
COVER WAND RF STERILE (DRAPES) ×2 IMPLANT
CUFF TOURN SGL QUICK 24 (TOURNIQUET CUFF)
CUFF TOURN SGL QUICK 34 (TOURNIQUET CUFF)
CUFF TOURN SGL QUICK 42 (TOURNIQUET CUFF) IMPLANT
CUFF TRNQT CYL 24X4X16.5-23 (TOURNIQUET CUFF) IMPLANT
CUFF TRNQT CYL 34X4.125X (TOURNIQUET CUFF) IMPLANT
DERMABOND ADVANCED (GAUZE/BANDAGES/DRESSINGS) ×1
DERMABOND ADVANCED .7 DNX12 (GAUZE/BANDAGES/DRESSINGS) ×1 IMPLANT
DRAIN CHANNEL 15F RND FF W/TCR (WOUND CARE) IMPLANT
DRAPE X-RAY CASS 24X20 (DRAPES) IMPLANT
ELECT REM PT RETURN 9FT ADLT (ELECTROSURGICAL) ×2
ELECTRODE REM PT RTRN 9FT ADLT (ELECTROSURGICAL) ×1 IMPLANT
EVACUATOR SILICONE 100CC (DRAIN) IMPLANT
GLOVE BIOGEL PI IND STRL 7.5 (GLOVE) ×1 IMPLANT
GLOVE BIOGEL PI INDICATOR 7.5 (GLOVE) ×1
GLOVE SURG SS PI 7.5 STRL IVOR (GLOVE) ×2 IMPLANT
GOWN STRL REUS W/ TWL LRG LVL3 (GOWN DISPOSABLE) ×2 IMPLANT
GOWN STRL REUS W/ TWL XL LVL3 (GOWN DISPOSABLE) ×1 IMPLANT
GOWN STRL REUS W/TWL LRG LVL3 (GOWN DISPOSABLE) ×4
GOWN STRL REUS W/TWL XL LVL3 (GOWN DISPOSABLE) ×2
HEMOSTAT SNOW SURGICEL 2X4 (HEMOSTASIS) IMPLANT
KIT BASIN OR (CUSTOM PROCEDURE TRAY) ×2 IMPLANT
KIT TURNOVER KIT B (KITS) ×2 IMPLANT
MARKER GRAFT CORONARY BYPASS (MISCELLANEOUS) IMPLANT
NS IRRIG 1000ML POUR BTL (IV SOLUTION) ×4 IMPLANT
PACK PERIPHERAL VASCULAR (CUSTOM PROCEDURE TRAY) ×2 IMPLANT
PAD ARMBOARD 7.5X6 YLW CONV (MISCELLANEOUS) ×4 IMPLANT
SET COLLECT BLD 21X3/4 12 (NEEDLE) IMPLANT
STOPCOCK 4 WAY LG BORE MALE ST (IV SETS) IMPLANT
SURGIFLO W/THROMBIN 8M KIT (HEMOSTASIS) ×2 IMPLANT
SUT ETHILON 3 0 PS 1 (SUTURE) IMPLANT
SUT PROLENE 5 0 C 1 24 (SUTURE) ×4 IMPLANT
SUT PROLENE 6 0 BV (SUTURE) ×2 IMPLANT
SUT PROLENE 7 0 BV 1 (SUTURE) IMPLANT
SUT SILK 2 0 SH (SUTURE) ×2 IMPLANT
SUT SILK 3 0 (SUTURE)
SUT SILK 3-0 18XBRD TIE 12 (SUTURE) IMPLANT
SUT VIC AB 2-0 CT1 27 (SUTURE) ×2
SUT VIC AB 2-0 CT1 TAPERPNT 27 (SUTURE) ×1 IMPLANT
SUT VIC AB 3-0 SH 27 (SUTURE) ×2
SUT VIC AB 3-0 SH 27X BRD (SUTURE) ×1 IMPLANT
SUT VICRYL 4-0 PS2 18IN ABS (SUTURE) ×2 IMPLANT
SYR 5ML LUER SLIP (SYRINGE) ×2 IMPLANT
TOWEL GREEN STERILE (TOWEL DISPOSABLE) ×2 IMPLANT
TRAY FOLEY MTR SLVR 16FR STAT (SET/KITS/TRAYS/PACK) IMPLANT
TUBING EXTENTION W/L.L. (IV SETS) IMPLANT
UNDERPAD 30X36 HEAVY ABSORB (UNDERPADS AND DIAPERS) ×2 IMPLANT
WATER STERILE IRR 1000ML POUR (IV SOLUTION) ×2 IMPLANT

## 2020-07-10 NOTE — Progress Notes (Signed)
ANTICOAGULATION CONSULT NOTE - Initial Consult  Pharmacy Consult for IV heparin Indication: lower extremity ischemia   Allergies  Allergen Reactions  . Hctz [Hydrochlorothiazide] Other (See Comments)    Dizziness, syncope; does NOT wish to take anymore    Patient Measurements: Height: 5\' 7"  (170.2 cm) Weight: 55.3 kg (122 lb) IBW/kg (Calculated) : 61.6 Heparin Dosing Weight: 55.3kg   Vital Signs: Temp: 98.4 F (36.9 C) (04/30 1952) Temp Source: Oral (04/30 1952) BP: 144/65 (04/30 2045) Pulse Rate: 69 (04/30 2045)  Labs: No results for input(s): HGB, HCT, PLT, APTT, LABPROT, INR, HEPARINUNFRC, HEPRLOWMOCWT, CREATININE, CKTOTAL, CKMB, TROPONINIHS in the last 72 hours.  CrCl cannot be calculated (Patient's most recent lab result is older than the maximum 21 days allowed.).   Medical History: Past Medical History:  Diagnosis Date  . Acute renal failure (ARF) ([place]) 08/17/2017  . Anxiety 04/05/2012  . Bilateral sensorineural hearing loss 06/11/2014   Mild to moderate on the left side and slight to mild on the right side per audiometry 05/2014.  Hearing aides with possible masking of tinnitus recommended but patient wished to defer secondary to finances.  . Blood transfusion without reported diagnosis    pt denies  . Bursitis of right shoulder 07/12/2012   s/p shoulder injection 07/12/2012   . Cataract of right eye    REMOVED RIGHT EYE 4-19   . Constipation due to pain medication 04/27/2010  . Diverticulosis 02/08/2012   Extensive left-sided diverticula on colonoscopy March 2012 per Dr. Gala Romney   . Essential hypertension 07/20/2006  . Genital herpes 07/20/2006  . 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 Headache 10/20/2019  . Heart murmur 1961  . Human immunodeficiency virus disease ([place]) 03/27/1986  . Hyperlipidemia LDL goal < 100 04/05/2012  . Hypokalemia 04/12/2018  . Insomnia 10/20/2019  . Long-term current use of opiate analgesic 03/17/2016  . Loose stools 04/08/2019  . Lumbar degenerative disc  disease 07/20/2006   With chronic back pain   . Marijuana use 07/03/2016  . Micturition syncope 09/20/2015  . Nausea and vomiting 04/08/2019  . Opiate dependence ([place]) 04/12/2018  . 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 Periumbilical hernia 07/14/84   1 cm left periumbilical abdominal wall defect  . 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 Right rotator cuff tear 02/01/2013   Responds to periodic steroid injections  . Seborrhea 09/01/2010  . Shoulder pain, right 12/04/2017  . Small bowel obstruction due to adhesions ([place]) 02/08/2012   s/p Exploratory laparotomy, lysis of adhesions 02/12/12    . 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 Tobacco abuse 02/19/2012  . Tobacco abuse   . Vasovagal syncope 02/15/2015  . Vitamin D deficiency 05/29/2018   Vitamin D 18.96 (04/30/2018), treated with ergocalciferol 50,000 units PO QWk X 4 weeks  . Voiding dysfunction    s/p cystoscopy and meatal dilation Dec 2005    Medications:  Infusions:  . potassium chloride    . sodium chloride      Assessment: 79yoF presenting with decreased pulse in L foot, concerned for lower extremity ischemia. Pt is not on anticoagulation PTA. CBC wnl. Pharmacy has been consulted for starting IV heparin.   Goal of Therapy:  Heparin level 0.3-0.7 units/ml Monitor platelets by anticoagulation protocol: Yes   Plan:  Give IV heparin 3,800 units x1 Initiate IV heparin infusion at 950 units/hr  Follow up with  heparin level in 6 hours Monitor CBC, s/sx bleeding  Mercy Riding, PharmD PGY1 Acute Care Pharmacy Resident Please refer to [facility] for unit-specific pharmacist

## 2020-07-10 NOTE — ED Triage Notes (Signed)
Pt BIB [facility] from [facility] for decreased pulse in her L foot. Pt L leg is cool to touch. Pt reporting 10/10 pain from her left hip down to left foot. Pt reports no history of blood clots.  BP with EMS in the 90s/50s. Pt is AOx4. 20 RW and 20RFA

## 2020-07-10 NOTE — Anesthesia Preprocedure Evaluation (Addendum)
Anesthesia Evaluation  Patient identified by MRN, date of birth, ID band Patient awake    Reviewed: Allergy & Precautions, NPO status , Patient's Chart, lab work & pertinent test resultsPreop documentation limited or incomplete due to emergent nature of procedure.  Airway Mallampati: II  TM Distance: >3 FB Neck ROM: Full    Dental  (+) Poor Dentition   Pulmonary Current Smoker,    Pulmonary exam normal breath sounds clear to auscultation       Cardiovascular hypertension, Pt. on medications + Peripheral Vascular Disease  Normal cardiovascular exam Rhythm:Regular Rate:Normal     Neuro/Psych  Headaches, PSYCHIATRIC DISORDERS Anxiety    GI/Hepatic negative GI ROS, (+)     substance abuse  ,   Endo/Other  negative endocrine ROS  Renal/GU negative Renal ROS     Musculoskeletal  (+) Arthritis ,   Abdominal   Peds  Hematology  (+) HIV, HLD   Anesthesia Other Findings COLD LEG  Reproductive/Obstetrics                            Anesthesia Physical Anesthesia Plan  ASA: III and emergent  Anesthesia Plan: General   Post-op Pain Management:    Induction: Intravenous and Rapid sequence  PONV Risk Score and Plan: 2 and Ondansetron, Dexamethasone, Midazolam and Treatment may vary due to age or medical condition  Airway Management Planned: Oral ETT  Additional Equipment: Arterial line  Intra-op Plan:   Post-operative Plan: Extubation in OR  Informed Consent: I have reviewed the patients History and Physical, chart, labs and discussed the procedure including the risks, benefits and alternatives for the proposed anesthesia with the patient or authorized representative who has indicated his/her understanding and acceptance.     Dental advisory given  Plan Discussed with: CRNA  Anesthesia Plan Comments:         Anesthesia Quick Evaluation

## 2020-07-10 NOTE — ED Notes (Signed)
Report given to E. Lopez.  Chrys Racer requested to this RN to go ahead and start the heparin drip but the potassium and fluids can wait for right now. Pt to be brought directly up to OR due to COVID not being resulted

## 2020-07-10 NOTE — Consult Note (Signed)
.                                    Vascular and Vein Specialist of [facility]  Patient name: Jessica [name] MRN: [id number] DOB: Jul 08, 1940 Sex: female   REQUESTING PROVIDER:   ER   REASON FOR CONSULT:    Ischemic left leg  HISTORY OF PRESENT ILLNESS:   Jessica [name] is a 80 y.o. female, who arrived at the [facility] following a ER to ER transfer for evaluation of ischemic left leg.  The patient began having symptoms this afternoon approximately 2 PM.  She had several episodes of falling as well as left leg pain.  She went to [facility].  There was concern over an ischemic leg.  A CT scan was attempted however transport arrived before she got her CT scan and so she was sent to [facility].  She was not started on heparin.  I was contacted and first heard about the patient when she arrived to the ER.  She was unable to move or feel her left leg which was painful.  She underwent aortobifemoral bypass graft by Dr. Kellie Simmering in 2009.  This was done for severe claudication in both legs.  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  She is a current smoker.  She takes a statin for hypercholesterolemia.  She is medically managed for hypertension    PAST MEDICAL HISTORY    Past Medical History:  Diagnosis Date  . Acute renal failure (ARF) ([place]) 08/17/2017  . Anxiety 04/05/2012  . Bilateral sensorineural hearing loss 06/11/2014   Mild to moderate on the left side and slight to mild on the right side per audiometry 05/2014.  Hearing aides with possible masking of tinnitus recommended but patient wished to defer secondary to finances.  . Blood transfusion without reported diagnosis    pt denies  . Bursitis of right shoulder 07/12/2012   s/p shoulder injection 07/12/2012   . Cataract of right eye    REMOVED RIGHT EYE 4-19   . Constipation due to pain medication 04/27/2010  . Diverticulosis 02/08/2012   Extensive left-sided diverticula on  colonoscopy March 2012 per Dr. Gala Romney   . Essential hypertension 07/20/2006  . Genital herpes 07/20/2006  . 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 Headache 10/20/2019  . Heart murmur 1961  . Human immunodeficiency virus disease ([place]) 03/27/1986  . Hyperlipidemia LDL goal < 100 04/05/2012  . Hypokalemia 04/12/2018  . Insomnia 10/20/2019  . Long-term current use of opiate analgesic 03/17/2016  . Loose stools 04/08/2019  . Lumbar degenerative disc disease 07/20/2006   With chronic back pain   . Marijuana use 07/03/2016  . Micturition syncope 09/20/2015  . Nausea and vomiting 04/08/2019  . Opiate dependence ([place]) 04/12/2018  . 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 Periumbilical hernia 04/21/9369   1 cm left periumbilical abdominal wall defect  . 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 Right rotator cuff tear 02/01/2013   Responds to periodic steroid injections  . Seborrhea 09/01/2010  . Shoulder pain, right 12/04/2017  . Small bowel obstruction due to adhesions ([place]) 02/08/2012   s/p Exploratory laparotomy, lysis of adhesions 02/12/12    . 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 Tobacco abuse  02/19/2012  . Tobacco abuse   . Vasovagal syncope 02/15/2015  . Vitamin D deficiency 05/29/2018   Vitamin D 18.96 (04/30/2018), treated with ergocalciferol 50,000 units PO QWk X 4 weeks  . Voiding dysfunction    s/p cystoscopy and meatal dilation Dec 2005     FAMILY HISTORY   Family History  Problem Relation Age of Onset  . Kidney failure Mother   . Diabetes Mother   . Hypertension Mother   . Heart disease Mother   . Glaucoma Father   . Congestive Heart Failure Sister   . Diabetes Sister   . Kidney disease Sister   . Diabetes Brother   . Unexplained death Brother 16       Automobile accident  . Hypothyroidism Daughter   . Arthritis Daughter        Neck/Back  . Healthy Son   . HIV/AIDS Brother   .  HIV Daughter   . Kidney disease Daughter   . Arthritis Son        Knee  . Colon cancer Neg Hx   . Colon polyps Neg Hx   . Esophageal cancer Neg Hx   . Rectal cancer Neg Hx   . Stomach cancer Neg Hx     SOCIAL HISTORY:   Social History   Socioeconomic History  . Marital status: Widowed    Spouse name: Not on file  . Number of children: 4  . Years of education: 2y college  . Highest education level: Not on file  Occupational History  . 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 Smoking status: Current Every Day Smoker    Packs/day: 0.50    Years: 50.00    Pack years: 25.00    Types: Cigarettes  . Smokeless tobacco: Never Used  Vaping Use  . Vaping Use: Never used  Substance and Sexual Activity  . Alcohol use: No    Alcohol/week: 0.0 standard drinks    Comment: "last drink of alcohol ~ 1977"  . Drug use: No    Comment: cutting back on chantix, 1 cigarette this morning  . Sexual activity: Never  Other Topics Concern  . Not on file  Social History Narrative   Lives alone in [place], [place]   Social Determinants of Health   Financial Resource Strain: Not on file  Food Insecurity: Not on file  Transportation Needs: Not on file  Physical Activity: Not on file  Stress: Not on file  Social Connections: Not on file  Intimate Partner Violence: Not on file    ALLERGIES:    Allergies  Allergen Reactions  . Hctz [Hydrochlorothiazide] Other (See Comments)    Dizziness, syncope; does NOT wish to take anymore    CURRENT MEDICATIONS:    Current Facility-Administered Medications  Medication Dose Route Frequency Provider Last Rate Last Admin  . heparin ADULT infusion 100 units/mL (25000 units/225m)  950 Units/hr Intravenous Continuous LAmedeo Plenty RPH 9.5 mL/hr at 07/10/20 2150 950 Units/hr at 07/10/20 2150  . potassium chloride 10 mEq in 100 mL IVPB  10 mEq Intravenous Q1 Hr x 3 Trifan, MCarola Rhine MD      .  sodium chloride 0.9 % bolus 1,000 mL  1,000 mL Intravenous Once Trifan, MCarola Rhine MD       Current Outpatient Medications  Medication Sig Dispense Refill  . acetaminophen (TYLENOL) 500 MG tablet Take 1,000 mg by mouth every 6 (six) hours as  needed for moderate pain or headache.    Marland Kitchen acyclovir (ZOVIRAX) 400 MG tablet TAKE 1 TABLET BY MOUTH THREE TIMES DAILY AS NEEDED FOR OUTBREAKS FOR SEVEN DAYS (Patient taking differently: Take 400 mg by mouth 3 (three) times daily as needed (outbreaks).) 21 tablet 5  . alendronate (FOSAMAX) 70 MG tablet Take 1 tablet (70 mg total) by mouth once a week. Take with a full glass of water on an empty stomach. (Patient taking differently: Take 70 mg by mouth every Sunday. Take with a full glass of water on an empty stomach.) 12 tablet 3  . ALPRAZolam (XANAX) 0.5 MG tablet TAKE 1 OR 2 TABLETS BY MOUTH AT BEDTIME AS NEEDED FOR ANXIETY OR SLEEP (Patient taking differently: Take 0.5-1 mg by mouth at bedtime.) 60 tablet 1  . amLODipine (NORVASC) 10 MG tablet TAKE 1 TABLET BY MOUTH DAILY (Patient taking differently: Take 10 mg by mouth daily.) 30 tablet 3  . aspirin 81 MG EC tablet Take 81 mg by mouth daily.    Marland Kitchen atorvastatin (LIPITOR) 10 MG tablet TAKE 1 TABLET BY MOUTH EVERY DAY (STOP LOVASTATIN) (Patient taking differently: Take 10 mg by mouth at bedtime.) 30 tablet 3  . atropine 1 % ophthalmic solution Place 1 drop into both eyes in the morning and at bedtime.    . benazepril (LOTENSIN) 40 MG tablet Take 1 tablet (40 mg total) by mouth daily. 90 tablet 3  . BIKTARVY 50-200-25 MG TABS tablet TAKE 1 TABLET BY MOUTH DAILY (Patient taking differently: Take 1 tablet by mouth daily.) 30 tablet 5  . COMBIGAN 0.2-0.5 % ophthalmic solution Place 1 drop into the left eye 2 (two) times daily.     . diclofenac Sodium (VOLTAREN) 1 % GEL APPLY TWO GRAMS TO AFFECTED AREA(S) FOUR TIMES DAILY (Patient taking differently: Apply 2 g topically 4 (four) times daily as needed (hip/shoulder  pain).) 100 g 5  . dorzolamide (TRUSOPT) 2 % ophthalmic solution Place 1 drop into both eyes 2 (two) times daily.  5  . latanoprost (XALATAN) 0.005 % ophthalmic solution Place 1 drop into both eyes at bedtime.    . Multiple Vitamin (MULTIVITAMIN WITH MINERALS) TABS Take 1 tablet by mouth daily.    Marland Kitchen ofloxacin (OCUFLOX) 0.3 % ophthalmic solution Place 1 drop into the right eye 4 (four) times daily.    . ondansetron (ZOFRAN-ODT) 4 MG disintegrating tablet Take 4 mg by mouth every 6 (six) hours as needed for nausea or vomiting.     . prednisoLONE acetate (PRED FORTE) 1 % ophthalmic suspension Place 1 drop into the right eye 6 (six) times daily.    . Rimegepant Sulfate (NURTEC) 75 MG TBDP Take 75 mg by mouth every other day. 45 tablet 3  . SUPREP BOWEL PREP KIT 17.5-3.13-1.6 GM/177ML SOLN Take 1 kit by mouth as directed.    . terazosin (HYTRIN) 5 MG capsule Take 1 capsule (5 mg total) by mouth at bedtime. 90 capsule 3  . traMADol (ULTRAM) 50 MG tablet TAKE TWO TABLETS BY MOUTH EVERY 8 HOURS AS NEEDED FOR SEVERE PAIN (Patient taking differently: Take 100 mg by mouth every 8 (eight) hours as needed for severe pain.) 180 tablet 5  . triamcinolone ointment (KENALOG) 0.1 % APPLY TO THE AFFECTED AREA(S) TWICE DAILY (Patient taking differently: Apply 1 application topically 2 (two) times daily as needed (rash).) 454 g 3    REVIEW OF SYSTEMS:   '[X]'  denotes positive finding, '[ ]'  denotes negative finding Cardiac  Comments:  Chest pain or chest pressure:    Shortness of breath upon exertion:    Short of breath when lying flat:    Irregular heart rhythm:        Vascular    Pain in calf, thigh, or hip brought on by ambulation: x   Pain in feet at night that wakes you up from your sleep:     Blood clot in your veins:    Leg swelling:         Pulmonary    Oxygen at home:    Productive cough:     Wheezing:         Neurologic    Sudden weakness in arms or legs:     Sudden numbness in arms or legs:      Sudden onset of difficulty speaking or slurred speech:    Temporary loss of vision in one eye:     Problems with dizziness:         Gastrointestinal    Blood in stool:      Vomited blood:         Genitourinary    Burning when urinating:     Blood in urine:        Psychiatric    Major depression:         Hematologic    Bleeding problems:    Problems with blood clotting too easily:        Skin    Rashes or ulcers:        Constitutional    Fever or chills:     PHYSICAL EXAM:   Vitals:   07/10/20 2015 07/10/20 2045 07/10/20 2145 07/10/20 2149  BP: (!) 131/57 (!) 144/65 (!) 165/81   Pulse: 62 69 93   Resp: 18 (!) 21 20   Temp:    98.2 F (36.8 C)  TempSrc:    Oral  SpO2: 99% 99% 95%   Weight:      Height:        GENERAL: The patient is a well-nourished female, in no acute distress. The vital signs are documented above. CARDIAC: There is a regular rate and rhythm.  VASCULAR: Palpable right femoral pulse, nonpalpable left femoral pulse PULMONARY: Nonlabored respirations ABDOMEN: Soft and non-tender with normal pitched bowel sounds.  MUSCULOSKELETAL: There are no major deformities or cyanosis. NEUROLOGIC: Unable to move her left foot.  No sensation to the left foot SKIN: There are no ulcers or rashes noted. PSYCHIATRIC: The patient has a normal affect.  STUDIES:   I have reviewed her CT angiogram which shows occlusion of the left limb of her aortobifemoral graft  ASSESSMENT and PLAN   Ischemic left leg: I discussed with the patient and her daughter that this is a limb threatening emergency.  She needs to go to the operating room for revascularization.  I discussed proceeding with attempt at left leg thrombectomy.  I discussed the possibility of a femoral-femoral bypass graft as well as fasciotomies.  They understand the urgency of the situation and want to proceed.   Leia Alf, MD, FACS [facility] Specialists of [facility] [phone] Pager [phone]

## 2020-07-10 NOTE — Anesthesia Procedure Notes (Signed)
Arterial Line Insertion Start/End4/30/2022 10:15 PM, 07/10/2020 10:17 PM Performed by: Jearld Pies, CRNA, CRNA  Patient location: OR. Preanesthetic checklist: patient identified, IV checked, site marked, risks and benefits discussed, surgical consent, monitors and equipment checked, pre-op evaluation, timeout performed and anesthesia consent Left, radial was placed Catheter size: 20 G Hand hygiene performed , maximum sterile barriers used  and Seldinger technique used Allen's test indicative of satisfactory collateral circulation Attempts: 1 Procedure performed without using ultrasound guided technique. Following insertion, dressing applied and Biopatch. Post procedure assessment: normal  Patient tolerated the procedure well with no immediate complications.

## 2020-07-10 NOTE — ED Notes (Signed)
Jessica [name] [phone] daughter would like calls with updates

## 2020-07-10 NOTE — Anesthesia Procedure Notes (Signed)
Procedure Name: Intubation Date/Time: 07/10/2020 10:12 PM Performed by: Jearld Pies, CRNA Pre-anesthesia Checklist: Patient identified, Emergency Drugs available, Suction available and Patient being monitored Patient Re-evaluated:Patient Re-evaluated prior to induction Oxygen Delivery Method: Circle System Utilized Preoxygenation: Pre-oxygenation with 100% oxygen Induction Type: IV induction, Rapid sequence and Cricoid Pressure applied Ventilation: Mask ventilation without difficulty Laryngoscope Size: Miller and 2 Grade View: Grade I Tube type: Oral Tube size: 7.0 mm Number of attempts: 1 Airway Equipment and Method: Stylet Placement Confirmation: ETT inserted through vocal cords under direct vision,  positive ETCO2 and breath sounds checked- equal and bilateral Secured at: 21 cm Tube secured with: Tape Dental Injury: Teeth and Oropharynx as per pre-operative assessment

## 2020-07-10 NOTE — ED Provider Notes (Signed)
[place] EMERGENCY DEPARTMENT Provider Note   CSN: [id number] Arrival date & time: 07/10/20  1940     History Chief Complaint  Patient presents with  . decreased pulse     Decreased pulse in L foot     Jessica [name] is a 80 y.o. female w/ hx of HIV on antiviral therapy, aorto-femoral bypass (2009), on aspirin, presented to emergency department as a transfer from [facility] with concern for decreased pulse in her left foot.  The patient reports that she had abrupt onset of pain from her left hip down to her left leg, loss of feeling, and heaviness in the left leg around 1 PM today.  She was feeling lightheaded at that time.  She is a history of recurrent vasovagal syncopal episodes.  She went to [facility], rate she had a metabolic work-up completed.  This was notable for potassium of 2.5, Cr 1.2, GFR > 50.  There was concern that she was absent pulses in the left lower extremity.  They were not able to complete a CT angio of the lower extremity due to vascular access problems.  The patient was instead transferred emergently to our emergency department.  She takes aspirin daily, no other A/C.  She does not currently have a vascular surgeon, so she was "released" from her vascular surgeon's office years ago.   HPI     Past Medical History:  Diagnosis Date  . Acute renal failure (ARF) ([place]) 08/17/2017  . Anxiety 04/05/2012  . Bilateral sensorineural hearing loss 06/11/2014   Mild to moderate on the left side and slight to mild on the right side per audiometry 05/2014.  Hearing aides with possible masking of tinnitus recommended but patient wished to defer secondary to finances.  . Blood transfusion without reported diagnosis    pt denies  . Bursitis of right shoulder 07/12/2012   s/p shoulder injection 07/12/2012   . Cataract of right eye    REMOVED RIGHT EYE 4-19   . Constipation due to pain medication 04/27/2010  . Diverticulosis 02/08/2012   Extensive  left-sided diverticula on colonoscopy March 2012 per Dr. Gala Romney   . Essential hypertension 07/20/2006  . Genital herpes 07/20/2006  . 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 Headache 10/20/2019  . Heart murmur 1961  . Human immunodeficiency virus disease ([place]) 03/27/1986  . Hyperlipidemia LDL goal < 100 04/05/2012  . Hypokalemia 04/12/2018  . Insomnia 10/20/2019  . Long-term current use of opiate analgesic 03/17/2016  . Loose stools 04/08/2019  . Lumbar degenerative disc disease 07/20/2006   With chronic back pain   . Marijuana use 07/03/2016  . Micturition syncope 09/20/2015  . Nausea and vomiting 04/08/2019  . Opiate dependence ([place]) 04/12/2018  . 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 Periumbilical hernia 5/0/3546   1 cm left periumbilical abdominal wall defect  . 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 Right rotator cuff tear 02/01/2013   Responds to periodic steroid injections  . Seborrhea 09/01/2010  . Shoulder pain, right 12/04/2017  . Small bowel obstruction due to adhesions ([place]) 02/08/2012   s/p Exploratory laparotomy, lysis of adhesions 02/12/12    . 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 Tobacco abuse 02/19/2012  . Tobacco abuse   . Vasovagal syncope 02/15/2015  . Vitamin D deficiency 05/29/2018   Vitamin D 18.96 (04/30/2018), treated with  ergocalciferol 50,000 units PO QWk X 4 weeks  . Voiding dysfunction    s/p cystoscopy and meatal dilation Dec 2005    Patient Active Problem List   Diagnosis Date Noted  . 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 Status post surgery 07/10/2020  . Headache 10/20/2019  . Insomnia 10/20/2019  . History of vitamin D deficiency 05/29/2018  . Opiate dependence ([place]) 04/12/2018  . Vasovagal syncope 01/04/2018  . Diastolic dysfunction 27/05/5007  . Marijuana use 07/03/2016  . Bilateral sensorineural hearing loss  06/11/2014  . 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 Chronic sinusitis 04/02/2013  . Right rotator cuff tear 02/01/2013  . 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 Generalized anxiety disorder 04/05/2012  . Healthcare maintenance 04/05/2012  . Hyperlipidemia 04/05/2012  . Smoker 02/19/2012  . History of small bowel obstruction 02/08/2012  . Diverticulosis of colon without hemorrhage 02/08/2012  . 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 Constipation due to pain medication 04/27/2010  . Genital herpes 07/20/2006  . 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 Essential hypertension 07/20/2006  . Lumbar degenerative disc disease 07/20/2006  . History of abnormal cervical Pap smear 07/20/2006  . Human immunodeficiency virus disease ([place]) 03/27/1986    Past Surgical History:  Procedure Laterality Date  . ABDOMINAL HYSTERECTOMY    . AORTO-FEMORAL BYPASS GRAFT  04/2007  . APPENDECTOMY    . BREAST SURGERY     Breast biopsy: negative  . CHOLECYSTECTOMY    . COLECTOMY  01/2011   Dr. Margot Chimes; "took out 12 inches of small intestiines and removed blockage"  . COLONOSCOPY  2012  . EYE SURGERY    . EYE SURGERY  06/29/2020   06-29-2020- RIGHT CATARACT REMOVED AND LEFT EYE SURGERY TO REMOVE SAND LIKE SUBSTANCE   . FEMORAL ARTERY EXPLORATION Left 07/10/2020   Procedure: REDO LEFT FEMORAL ARTERY EXPOSURE;  Surgeon: Serafina Mitchell, MD;  Location: [facility];  Service: Vascular;  Laterality: Left;  . LAPAROTOMY  02/12/2012   Procedure: EXPLORATORY LAPAROTOMY;  Surgeon: Stark Klein, MD;  Location: [facility] OR;  Service: General;  Laterality: N/A;  Exploratory Laparotomy, lysis of adhesions  . SMALL INTESTINE SURGERY    . THROMBECTOMY FEMORAL ARTERY Left 07/10/2020   Procedure: THROMBECTOMY AORTA-BIFEMORAL GRAFT, PROFUNDA, AND SUPERFICIAL FEMORAL ARTERY  LEFT LEG;  Surgeon: Serafina Mitchell, MD;  Location: [facility] OR;  Service: Vascular;  Laterality: Left;     OB History    Gravida  6   Para   4   Term  4   Preterm      AB  2   Living  4     SAB  2   IAB      Ectopic      Multiple      Live Births              Family History  Problem Relation Age of Onset  . Kidney failure Mother   . Diabetes Mother   . Hypertension Mother   . Heart disease Mother   . Glaucoma Father   . Congestive Heart Failure Sister   . Diabetes Sister   . Kidney disease Sister   . Diabetes Brother   . Unexplained death Brother 3       Automobile accident  . Hypothyroidism Daughter   . Arthritis Daughter        Neck/Back  . Healthy Son   . HIV/AIDS Brother   . HIV Daughter   .  Kidney disease Daughter   . Arthritis Son        Knee  . Colon cancer Neg Hx   . Colon polyps Neg Hx   . Esophageal cancer Neg Hx   . Rectal cancer Neg Hx   . Stomach cancer Neg Hx     Social History   Tobacco Use  . Smoking status: Current Every Day Smoker    Packs/day: 0.50    Years: 50.00    Pack years: 25.00    Types: Cigarettes  . Smokeless tobacco: Never Used  Vaping Use  . Vaping Use: Never used  Substance Use Topics  . Alcohol use: No    Alcohol/week: 0.0 standard drinks    Comment: "last drink of alcohol ~ 1977"  . Drug use: No    Comment: cutting back on chantix, 1 cigarette this morning    Home Medications Prior to Admission medications   Medication Sig Start Date End Date Taking? Authorizing Provider  acetaminophen (TYLENOL) 500 MG tablet Take 1,000 mg by mouth every 6 (six) hours as needed for moderate pain or headache.   Yes [provider]  acyclovir (ZOVIRAX) 400 MG tablet TAKE 1 TABLET BY MOUTH THREE TIMES DAILY AS NEEDED FOR OUTBREAKS FOR SEVEN DAYS Patient taking differently: Take 400 mg by mouth 3 (three) times daily as needed (outbreaks). 06/15/20  Yes Sid Falcon, MD  alendronate (FOSAMAX) 70 MG tablet Take 1 tablet (70 mg total) by mouth once a week. Take with a full glass of water on an empty stomach. Patient taking differently: Take 70 mg by  mouth every Sunday. Take with a full glass of water on an empty stomach. 05/13/20  Yes Sid Falcon, MD  ALPRAZolam Duanne Moron) 0.5 MG tablet TAKE 1 OR 2 TABLETS BY MOUTH AT BEDTIME AS NEEDED FOR ANXIETY OR SLEEP Patient taking differently: Take 0.5-1 mg by mouth at bedtime. 07/01/20  Yes Sid Falcon, MD  amLODipine (NORVASC) 10 MG tablet TAKE 1 TABLET BY MOUTH DAILY Patient taking differently: Take 10 mg by mouth daily. 04/01/20  Yes Sid Falcon, MD  aspirin 81 MG EC tablet Take 81 mg by mouth daily.   Yes [provider]  atorvastatin (LIPITOR) 10 MG tablet TAKE 1 TABLET BY MOUTH EVERY DAY (STOP LOVASTATIN) Patient taking differently: Take 10 mg by mouth at bedtime. 06/02/20  Yes Tommy Medal, Lavell Islam, MD  atropine 1 % ophthalmic solution Place 1 drop into both eyes in the morning and at bedtime.   Yes [provider]  benazepril (LOTENSIN) 40 MG tablet Take 1 tablet (40 mg total) by mouth daily. 06/17/20  Yes Oda Kilts, MD  BIKTARVY 50-200-25 MG TABS tablet TAKE 1 TABLET BY MOUTH DAILY Patient taking differently: Take 1 tablet by mouth daily. 06/07/20  Yes Tommy Medal, Lavell Islam, MD  COMBIGAN 0.2-0.5 % ophthalmic solution Place 1 drop into the left eye 2 (two) times daily.  01/06/13  Yes [provider]  diclofenac Sodium (VOLTAREN) 1 % GEL APPLY TWO GRAMS TO AFFECTED AREA(S) FOUR TIMES DAILY Patient taking differently: Apply 2 g topically 4 (four) times daily as needed (hip/shoulder pain). 07/05/20  Yes Sid Falcon, MD  dorzolamide (TRUSOPT) 2 % ophthalmic solution Place 1 drop into both eyes 2 (two) times daily. 08/03/17  Yes [provider]  latanoprost (XALATAN) 0.005 % ophthalmic solution Place 1 drop into both eyes at bedtime. 08/16/17  Yes [provider]  Multiple Vitamin (MULTIVITAMIN WITH MINERALS) TABS  Take 1 tablet by mouth daily.   Yes [provider]  ofloxacin (OCUFLOX) 0.3 % ophthalmic solution Place 1 drop into the  right eye 4 (four) times daily. 06/29/20  Yes [provider]  ondansetron (ZOFRAN-ODT) 4 MG disintegrating tablet Take 4 mg by mouth every 6 (six) hours as needed for nausea or vomiting.  05/06/19  Yes [provider]  prednisoLONE acetate (PRED FORTE) 1 % ophthalmic suspension Place 1 drop into the right eye 6 (six) times daily. 06/29/20 07/29/20 Yes [provider]  Rimegepant Sulfate (NURTEC) 75 MG TBDP Take 75 mg by mouth every other day. 04/09/20  Yes Sid Falcon, MD  SUPREP BOWEL PREP KIT 17.5-3.13-1.6 GM/177ML SOLN Take 1 kit by mouth as directed. 07/06/20  Yes [provider]  terazosin (HYTRIN) 5 MG capsule Take 1 capsule (5 mg total) by mouth at bedtime. 11/10/19  Yes Sid Falcon, MD  traMADol (ULTRAM) 50 MG tablet TAKE TWO TABLETS BY MOUTH EVERY 8 HOURS AS NEEDED FOR SEVERE PAIN Patient taking differently: Take 100 mg by mouth every 8 (eight) hours as needed for severe pain. 02/05/20  Yes Sid Falcon, MD  triamcinolone ointment (KENALOG) 0.1 % APPLY TO THE AFFECTED AREA(S) TWICE DAILY Patient taking differently: Apply 1 application topically 2 (two) times daily as needed (rash). 07/05/20  Yes Sid Falcon, MD    Allergies    Hctz [hydrochlorothiazide]  Review of Systems   Review of Systems  Constitutional: Negative for chills and fever.  Eyes: Negative for pain and visual disturbance.  Respiratory: Negative for cough and shortness of breath.   Cardiovascular: Negative for chest pain and palpitations.  Gastrointestinal: Negative for abdominal pain and vomiting.  Musculoskeletal: Positive for arthralgias, back pain and gait problem.  Skin: Negative for color change and rash.  Neurological: Positive for weakness and numbness.  All other systems reviewed and are negative.   Physical Exam Updated Vital Signs BP (!) 93/55 (BP Location: Right Arm)   Pulse (!) 54   Temp 99.3 F (37.4 C) (Oral)   Resp 16   Ht '5\' 7"'  (1.702 m)   Wt 55.3  kg   SpO2 96%   BMI 19.11 kg/m   Physical Exam Constitutional:      General: She is not in acute distress.    Comments: Thin, frail  HENT:     Head: Normocephalic and atraumatic.  Eyes:     Conjunctiva/sclera: Conjunctivae normal.     Pupils: Pupils are equal, round, and reactive to light.  Cardiovascular:     Rate and Rhythm: Normal rate and regular rhythm.     Comments: Left femoral pulse audible faintly with doppler only Left popliteal, tibial and pedal pulse absent Right LE pulses present and audible with doppler Pulmonary:     Effort: Pulmonary effort is normal. No respiratory distress.  Abdominal:     General: There is no distension.     Tenderness: There is no abdominal tenderness.  Skin:    General: Skin is warm and dry.     Comments: Left foot cool to touch  Neurological:     Mental Status: She is alert.     Comments: Absent sensation in left lower extremity Unable to move left lower extremity  Psychiatric:        Mood and Affect: Mood normal.        Behavior: Behavior normal.     ED Results / Procedures / Treatments   Labs (all labs ordered  are listed, but only abnormal results are displayed) Labs Reviewed  BASIC METABOLIC PANEL - Abnormal; Notable for the following components:      Result Value   Chloride 112 (*)    CO2 19 (*)    Glucose, Bld 125 (*)    Creatinine, Ser 1.01 (*)    GFR, Estimated 57 (*)    All other components within normal limits  CBC WITH DIFFERENTIAL/PLATELET - Abnormal; Notable for the following components:   Neutro Abs 8.1 (*)    All other components within normal limits  CBC - Abnormal; Notable for the following components:   RBC 3.41 (*)    Hemoglobin 11.4 (*)    HCT 33.5 (*)    All other components within normal limits  CBC - Abnormal; Notable for the following components:   RBC 3.26 (*)    Hemoglobin 10.8 (*)    HCT 32.4 (*)    All other components within normal limits  BASIC METABOLIC PANEL - Abnormal; Notable for the  following components:   Potassium 3.1 (*)    Glucose, Bld 144 (*)    Calcium 8.8 (*)    All other components within normal limits  RESP PANEL BY RT-PCR (FLU A&B, COVID) ARPGX2  PROTIME-INR  LIPID PANEL  HEPARIN LEVEL (UNFRACTIONATED)  POCT ACTIVATED CLOTTING TIME  SURGICAL PATHOLOGY    EKG None  Radiology CT ANGIO AO+BIFEM W & OR WO CONTRAST  Addendum Date: 07/10/2020   ADDENDUM REPORT: 07/10/2020 22:45 ADDENDUM: Findings were not discussed with the provider, however the patient was in the OR undergoing a thrombectomy at the time this dictation was completed. Electronically Signed   By: Constance Holster M.D.   On: 07/10/2020 22:45   Result Date: 07/10/2020 CLINICAL DATA:  Left lower extremity pain from hip to the foot. Concern for vascular occlusion. EXAM: CT ANGIOGRAPHY OF ABDOMINAL AORTA WITH ILIOFEMORAL RUNOFF TECHNIQUE: Multidetector CT imaging of the abdomen, pelvis and lower extremities was performed using the standard protocol during bolus administration of intravenous contrast. Multiplanar CT image reconstructions and MIPs were obtained to evaluate the vascular anatomy. CONTRAST:  139m OMNIPAQUE IOHEXOL 350 MG/ML SOLN COMPARISON:  CT dated September 06, 2013 FINDINGS: VASCULAR Aorta: The patient is status post prior aortobifem bypass graft placement. The aortic portions of the graft are patent where visualized. The proximal native abdominal aorta is patent. Celiac: There is variant anatomy with the celiac axis supplying the splenic artery and left gastric artery peer SMA: The SMA is widely patent with the replaced common hepatic artery. Renals: There is moderate narrowing IMA: The IMA is chronically occluded RIGHT Lower Extremity Inflow: The right iliac limb of the graft is widely patent as before. Outflow: The right common femoral artery is widely patent. There are mild areas of narrowing involving the SFA and popliteal artery without evidence for a high-grade stenosis or complete  occlusion. Runoff: Patent three vessel runoff to the ankle. LEFT Lower Extremity Inflow: There is complete occlusion of the left iliac limb of the graft, new since 2015. However, this finding is age indeterminate. Outflow: There is no flow in the left lower extremity. Runoff: There is no flow in the left lower extremity. Veins: No obvious venous abnormality within the limitations of this arterial phase study. Review of the MIP images confirms the above findings. NON-VASCULAR Lower chest: There are emphysematous changes at the lung bases. There is chronic atelectasis at the lung bases.The heart size is normal. Hepatobiliary: The liver is normal. Status post cholecystectomy.There is  relatively stable intrahepatic and extrahepatic biliary ductal dilatation. Pancreas: Normal contours without ductal dilatation. No peripancreatic fluid collection. Spleen: Unremarkable. Adrenals/Urinary Tract: --Adrenal glands: Unremarkable. --Right kidney/ureter: No hydronephrosis or radiopaque kidney stones. --Left kidney/ureter: No hydronephrosis or radiopaque kidney stones. --Urinary bladder: Unremarkable. Stomach/Bowel: --Stomach/Duodenum: No hiatal hernia or other gastric abnormality. Normal duodenal course and caliber. --Small bowel: Unremarkable. --Colon: There is colonic diverticulosis without CT evidence for diverticulitis. There is diffuse circumferential wall thickening of the splenic flexure of the colon and descending colon. There are surrounding inflammatory changes. There is some mild wall thickening of the ascending colon and transverse colon as well. --Appendix: Not visualized. No right lower quadrant inflammation or free fluid. Lymphatic: --No retroperitoneal lymphadenopathy. --No mesenteric lymphadenopathy. --No pelvic or inguinal lymphadenopathy. Reproductive: Status post hysterectomy. No adnexal mass. Other: No ascites or free air. The abdominal wall is normal. Musculoskeletal. No acute displaced fractures.  IMPRESSION: 1. Age-indeterminate complete occlusion of the left iliac limb of the aortobifem bypass graft, new since 2015. There is essentially no flow to the left lower extremity on this study. 2. Mild atherosclerotic disease involving the right lower extremity without evidence for an occlusion or high-grade stenosis. 3. There is diffuse circumferential wall thickening of the left hemicolon. This may be secondary to infectious or inflammatory colitis. Ischemic colitis is not entirely excluded given the patient's underlying vascular disease. Aortic Atherosclerosis (ICD10-I70.0) and Emphysema (ICD10-J43.9). Electronically Signed: By: Constance Holster M.D. On: 07/10/2020 22:28    Procedures .Critical Care Performed by: Wyvonnia Dusky, MD Authorized by: Wyvonnia Dusky, MD   Critical care provider statement:    Critical care time (minutes):  45   Critical care was necessary to treat or prevent imminent or life-threatening deterioration of the following conditions:  Circulatory failure   Critical care was time spent personally by me on the following activities:  Discussions with consultants, evaluation of patient's response to treatment, examination of patient, ordering and performing treatments and interventions, ordering and review of laboratory studies, ordering and review of radiographic studies, pulse oximetry, re-evaluation of patient's condition, obtaining history from patient or surrogate and review of old charts Comments:     IV heparin, iliac artery occlusion     Medications Ordered in ED Medications  potassium chloride 10 mEq in 100 mL IVPB ( Intravenous MAR Unhold 07/11/20 0129)  sodium chloride 0.9 % bolus 1,000 mL ( Intravenous MAR Unhold 07/11/20 0129)  acyclovir (ZOVIRAX) tablet 400 mg (has no administration in time range)  amLODipine (NORVASC) tablet 10 mg (10 mg Oral Given 07/11/20 0843)  aspirin chewable tablet 81 mg (81 mg Oral Given 07/11/20 0843)  atorvastatin (LIPITOR)  tablet 10 mg (10 mg Oral Given 07/11/20 0221)  benazepril (LOTENSIN) tablet 40 mg (40 mg Oral Given 07/11/20 0845)  bictegravir-emtricitabine-tenofovir AF (BIKTARVY) 50-200-25 MG per tablet 1 tablet (1 tablet Oral Given 07/11/20 0846)  atropine 1 % ophthalmic solution 1 drop (1 drop Both Eyes Given 07/11/20 0845)  dorzolamide (TRUSOPT) 2 % ophthalmic solution 1 drop (1 drop Both Eyes Given 07/11/20 0848)  latanoprost (XALATAN) 0.005 % ophthalmic solution 1 drop (1 drop Both Eyes Given 07/11/20 0222)  ofloxacin (OCUFLOX) 0.3 % ophthalmic solution 1 drop (1 drop Right Eye Given 07/11/20 0847)  prednisoLONE acetate (PRED FORTE) 1 % ophthalmic suspension 1 drop (1 drop Right Eye Given 07/11/20 1105)  Rimegepant Sulfate TBDP 75 mg (has no administration in time range)  terazosin (HYTRIN) capsule 5 mg (5 mg Oral Given 07/11/20 0222)  0.9 %  sodium chloride infusion (has no administration in time range)  magnesium sulfate IVPB 2 g 50 mL (has no administration in time range)  potassium chloride SA (KLOR-CON) CR tablet 20-40 mEq (has no administration in time range)  acetaminophen (TYLENOL) tablet 325-650 mg (has no administration in time range)    Or  acetaminophen (TYLENOL) suppository 325-650 mg (has no administration in time range)  docusate sodium (COLACE) capsule 100 mg (100 mg Oral Given 07/11/20 0843)  ondansetron (ZOFRAN) injection 4 mg (has no administration in time range)  alum & mag hydroxide-simeth (MAALOX/MYLANTA) 200-200-20 MG/5ML suspension 15-30 mL (has no administration in time range)  pantoprazole (PROTONIX) EC tablet 40 mg (40 mg Oral Given 07/11/20 0842)  labetalol (NORMODYNE) injection 10 mg (has no administration in time range)  hydrALAZINE (APRESOLINE) injection 5 mg (has no administration in time range)  metoprolol tartrate (LOPRESSOR) injection 2-5 mg (has no administration in time range)  guaiFENesin-dextromethorphan (ROBITUSSIN DM) 100-10 MG/5ML syrup 15 mL (has no administration in time range)   phenol (CHLORASEPTIC) mouth spray 1 spray (has no administration in time range)  0.9 %  sodium chloride infusion ( Intravenous New Bag/Given 07/11/20 0208)  oxyCODONE-acetaminophen (PERCOCET/ROXICET) 5-325 MG per tablet 1-2 tablet (2 tablets Oral Given 07/11/20 0913)  HYDROmorphone (DILAUDID) injection 0.5-1 mg (has no administration in time range)  senna-docusate (Senokot-S) tablet 1 tablet (has no administration in time range)  bisacodyl (DULCOLAX) EC tablet 5 mg (has no administration in time range)  zolpidem (AMBIEN) tablet 5 mg (has no administration in time range)  heparin ADULT infusion 100 units/mL (25000 units/247m) (950 Units/hr Intravenous Handoff 07/11/20 0708)  morphine 4 MG/ML injection 4 mg (4 mg Intravenous Given 07/10/20 2101)  heparin bolus via infusion 3,800 Units (3,800 Units Intravenous Bolus from Bag 07/10/20 2150)  iohexol (OMNIPAQUE) 350 MG/ML injection 125 mL (125 mLs Intravenous Contrast Given 07/10/20 2205)  ceFAZolin (ANCEF) IVPB 2g/100 mL premix (2 g Intravenous New Bag/Given 07/11/20 1107)    ED Course  I have reviewed the triage vital signs and the nursing notes.  Pertinent labs & imaging results that were available during my care of the patient were reviewed by me and considered in my medical decision making (see chart for details).  80[facility] w[facility] HIV [facility], and found to have loss of pulses in left lower extremity.  [facility] imaging due to loss of IV access and request for emergent transfer.    I consulted Dr BTrula Sladefrom vascular surgery immediately after evaluating the patient and determining that she had no pulses in her left lower extremity.  Stat CTA abd with runoff ordered and obtained showing likely graft occlusion. IV heparin ordered per pharmacy.  Labs reviewed [facility].  CTA showing nonspecific colonic  thickening without evidence of acute colitis.  WBC 10.  Afebrile.  Will defer decision for antibiotics to inpatient team, as this may be a chronic finding.  IV morphine ordered for pain.  Clinical Course as of 07/11/20 1138  Sat Jul 10, 2020  [facility] -Cr 1.2, GFR 50.  I called CT tech and asked for stat CT imaging  [MT]  2026 [facility]. [MT]  2037 I spoke to Dr BTrula Slade vascular surgery, and consult placed. [MT]  2042  I spoke to the attending at [facility] who confirms the patient did NOT complete CT imaging there, and was not given potassium. [MT]  2147 Dr Trula Slade planning to take patient to OR. [MT]    Clinical Course User Index [MT] Buck Mcaffee, Carola Rhine, MD    Final Clinical Impression(s) / ED Diagnoses Final diagnoses:  Iliac artery occlusion, left [facility])    Rx / DC Orders ED Discharge Orders    None       Wyvonnia Dusky, MD 07/11/20 [id number]

## 2020-07-11 ENCOUNTER — Encounter (HOSPITAL_COMMUNITY): Payer: Self-pay | Admitting: Surgery

## 2020-07-11 DIAGNOSIS — I743 Embolism and thrombosis of arteries of the lower extremities: Secondary | ICD-10-CM

## 2020-07-11 HISTORY — DX: Embolism and thrombosis of arteries of the lower extremities: I74.3

## 2020-07-11 LAB — BASIC METABOLIC PANEL
Anion gap: 9 (ref 5–15)
BUN: 13 mg/dL (ref 8–23)
CO2: 22 mmol/L (ref 22–32)
Calcium: 8.8 mg/dL — ABNORMAL LOW (ref 8.9–10.3)
Chloride: 110 mmol/L (ref 98–111)
Creatinine, Ser: 0.82 mg/dL (ref 0.44–1.00)
GFR, Estimated: >60 mL/min (ref >60)
Glucose, Bld: 144 mg/dL — ABNORMAL HIGH (ref 70–99)
Potassium: 3.1 mmol/L — ABNORMAL LOW (ref 3.5–5.1)
Sodium: 141 mmol/L (ref 135–145)

## 2020-07-11 LAB — CBC
HCT: 32.4 % — ABNORMAL LOW (ref 36.0–46.0)
HCT: 33.5 % — ABNORMAL LOW (ref 36.0–46.0)
Hemoglobin: 10.8 g/dL — ABNORMAL LOW (ref 12.0–15.0)
Hemoglobin: 11.4 g/dL — ABNORMAL LOW (ref 12.0–15.0)
MCH: 33.1 pg (ref 26.0–34.0)
MCH: 33.4 pg (ref 26.0–34.0)
MCHC: 33.3 g/dL (ref 30.0–36.0)
MCHC: 34 g/dL (ref 30.0–36.0)
MCV: 99.4 fL (ref 80.0–100.0)
MCV: 98.2 fL (ref 80.0–100.0)
Platelets: 175 10*3/uL (ref 150–400)
Platelets: 198 10*3/uL (ref 150–400)
RBC: 3.26 MIL/uL — ABNORMAL LOW (ref 3.87–5.11)
RBC: 3.41 MIL/uL — ABNORMAL LOW (ref 3.87–5.11)
RDW: 13.2 % (ref 11.5–15.5)
RDW: 13 % (ref 11.5–15.5)
WBC: 7.1 10*3/uL (ref 4.0–10.5)
WBC: 7.8 10*3/uL (ref 4.0–10.5)
nRBC: 0 % (ref 0.0–0.2)
nRBC: 0 % (ref 0.0–0.2)

## 2020-07-11 LAB — HEPARIN LEVEL (UNFRACTIONATED)
Heparin Unfractionated: 0.63 IU/mL (ref 0.30–0.70)
Heparin Unfractionated: 0.63 IU/mL (ref 0.30–0.70)

## 2020-07-11 LAB — LIPID PANEL
Cholesterol: 100 mg/dL (ref 0–200)
HDL: 56 mg/dL (ref >40)
LDL Cholesterol: 39 mg/dL (ref 0–99)
Total CHOL/HDL Ratio: 1.8 RATIO
Triglycerides: 25 mg/dL (ref <150)
VLDL: 5 mg/dL (ref 0–40)

## 2020-07-11 LAB — POCT ACTIVATED CLOTTING TIME: Activated Clotting Time: 261 seconds

## 2020-07-11 MED ORDER — LATANOPROST 0.005 % OP SOLN
1.0000 [drp] | Freq: Every day | OPHTHALMIC | Status: DC
  Administered 2020-07-11 – 2020-07-12 (×3): 1 [drp] via OPHTHALMIC
  Filled 2020-07-11: qty 2.5

## 2020-07-11 MED ORDER — ALUM & MAG HYDROXIDE-SIMETH 200-200-20 MG/5ML PO SUSP: 15.0000 mL | ORAL | Status: DC | PRN

## 2020-07-11 MED ORDER — HYDRALAZINE HCL 20 MG/ML IJ SOLN: 5.0000 mg | INTRAMUSCULAR | Status: DC | PRN

## 2020-07-11 MED ORDER — DORZOLAMIDE HCL 2 % OP SOLN
1.0000 [drp] | Freq: Two times a day (BID) | OPHTHALMIC | Status: DC
  Administered 2020-07-11 – 2020-07-13 (×6): 1 [drp] via OPHTHALMIC
  Filled 2020-07-11: qty 10

## 2020-07-11 MED ORDER — ATROPINE SULFATE 1 % OP SOLN
1.0000 [drp] | Freq: Two times a day (BID) | OPHTHALMIC | Status: DC
  Administered 2020-07-11 – 2020-07-13 (×6): 1 [drp] via OPHTHALMIC
  Filled 2020-07-11: qty 2

## 2020-07-11 MED ORDER — ASPIRIN 81 MG PO CHEW
81.0000 mg | CHEWABLE_TABLET | Freq: Every day | ORAL | Status: DC
  Administered 2020-07-11 – 2020-07-13 (×3): 81 mg via ORAL
  Filled 2020-07-11 (×3): qty 1

## 2020-07-11 MED ORDER — ONDANSETRON HCL 4 MG/2ML IJ SOLN: 4.0000 mg | Freq: Four times a day (QID) | INTRAMUSCULAR | Status: DC | PRN

## 2020-07-11 MED ORDER — ACETAMINOPHEN 325 MG RE SUPP
325.0000 mg | RECTAL | Status: DC | PRN
Start: 2020-07-10 — End: 2020-07-13
  Filled 2020-07-11: qty 2

## 2020-07-11 MED ORDER — ALENDRONATE SODIUM 70 MG PO TABS: 70.0000 mg | ORAL_TABLET | ORAL | Status: DC

## 2020-07-11 MED ORDER — HEPARIN (PORCINE) 25000 UT/250ML-% IV SOLN
850.0000 [IU]/h | INTRAVENOUS | Status: DC
  Administered 2020-07-11: 950 [IU]/h via INTRAVENOUS
  Administered 2020-07-13: 850 [IU]/h via INTRAVENOUS
  Filled 2020-07-11 (×4): qty 250

## 2020-07-11 MED ORDER — HYDROMORPHONE HCL 1 MG/ML IJ SOLN
0.5000 mg | INTRAMUSCULAR | Status: DC | PRN
Start: 2020-07-11 — End: 2020-07-13

## 2020-07-11 MED ORDER — ACETAMINOPHEN 325 MG PO TABS
325.0000 mg | ORAL_TABLET | ORAL | Status: DC | PRN
  Administered 2020-07-13: 650 mg via ORAL
  Filled 2020-07-11: qty 2

## 2020-07-11 MED ORDER — BISACODYL 5 MG PO TBEC: 5.0000 mg | DELAYED_RELEASE_TABLET | Freq: Every day | ORAL | Status: DC | PRN

## 2020-07-11 MED ORDER — OFLOXACIN 0.3 % OP SOLN
1.0000 [drp] | Freq: Four times a day (QID) | OPHTHALMIC | Status: DC
  Administered 2020-07-11 – 2020-07-13 (×11): 1 [drp] via OPHTHALMIC
  Filled 2020-07-11: qty 5

## 2020-07-11 MED ORDER — SODIUM CHLORIDE 0.9 % IV SOLN: INTRAVENOUS | Status: DC

## 2020-07-11 MED ORDER — OXYCODONE-ACETAMINOPHEN 5-325 MG PO TABS
1.0000 | ORAL_TABLET | ORAL | Status: DC | PRN
  Administered 2020-07-11 (×2): 2 via ORAL
  Administered 2020-07-11: 1 via ORAL
  Administered 2020-07-12: 2 via ORAL
  Filled 2020-07-11: qty 1
  Filled 2020-07-11 (×3): qty 2

## 2020-07-11 MED ORDER — DOCUSATE SODIUM 100 MG PO CAPS
100.0000 mg | ORAL_CAPSULE | Freq: Every day | ORAL | Status: DC
  Administered 2020-07-11 – 2020-07-13 (×3): 100 mg via ORAL
  Filled 2020-07-11 (×3): qty 1

## 2020-07-11 MED ORDER — ATORVASTATIN CALCIUM 10 MG PO TABS
10.0000 mg | ORAL_TABLET | Freq: Every day | ORAL | Status: DC
  Administered 2020-07-11 – 2020-07-12 (×3): 10 mg via ORAL
  Filled 2020-07-11 (×3): qty 1

## 2020-07-11 MED ORDER — RIMEGEPANT SULFATE 75 MG PO TBDP: 75.0000 mg | ORAL_TABLET | ORAL | Status: DC

## 2020-07-11 MED ORDER — GUAIFENESIN-DM 100-10 MG/5ML PO SYRP: 15.0000 mL | ORAL_SOLUTION | ORAL | Status: DC | PRN

## 2020-07-11 MED ORDER — BENAZEPRIL HCL 20 MG PO TABS
40.0000 mg | ORAL_TABLET | Freq: Every day | ORAL | Status: DC
  Administered 2020-07-11 – 2020-07-13 (×3): 40 mg via ORAL
  Filled 2020-07-11: qty 8
  Filled 2020-07-11 (×3): qty 2

## 2020-07-11 MED ORDER — ACETAMINOPHEN 10 MG/ML IV SOLN: 1000.0000 mg | Freq: Once | INTRAVENOUS | Status: DC | PRN

## 2020-07-11 MED ORDER — CEFAZOLIN SODIUM-DEXTROSE 2-4 GM/100ML-% IV SOLN
2.0000 g | Freq: Three times a day (TID) | INTRAVENOUS | Status: AC
  Administered 2020-07-11 (×2): 2 g via INTRAVENOUS
  Filled 2020-07-11 (×2): qty 100

## 2020-07-11 MED ORDER — TERAZOSIN HCL 5 MG PO CAPS
5.0000 mg | ORAL_CAPSULE | Freq: Every day | ORAL | Status: DC
  Administered 2020-07-11 – 2020-07-12 (×3): 5 mg via ORAL
  Filled 2020-07-11 (×4): qty 1

## 2020-07-11 MED ORDER — SODIUM CHLORIDE 0.9 % IV SOLN: 500.0000 mL | Freq: Once | INTRAVENOUS | Status: DC | PRN

## 2020-07-11 MED ORDER — ACYCLOVIR 400 MG PO TABS
400.0000 mg | ORAL_TABLET | Freq: Three times a day (TID) | ORAL | Status: DC | PRN
  Filled 2020-07-11: qty 1

## 2020-07-11 MED ORDER — PREDNISOLONE ACETATE 1 % OP SUSP
1.0000 [drp] | Freq: Every day | OPHTHALMIC | Status: DC
  Administered 2020-07-11 – 2020-07-13 (×17): 1 [drp] via OPHTHALMIC
  Filled 2020-07-11: qty 5

## 2020-07-11 MED ORDER — PHENOL 1.4 % MT LIQD
1.0000 | OROMUCOSAL | Status: DC | PRN
Start: 2020-07-11 — End: 2020-07-13

## 2020-07-11 MED ORDER — SENNOSIDES-DOCUSATE SODIUM 8.6-50 MG PO TABS: 1.0000 | ORAL_TABLET | Freq: Every evening | ORAL | Status: DC | PRN

## 2020-07-11 MED ORDER — METOPROLOL TARTRATE 5 MG/5ML IV SOLN: 2.0000 mg | INTRAVENOUS | Status: DC | PRN

## 2020-07-11 MED ORDER — AMLODIPINE BESYLATE 10 MG PO TABS
10.0000 mg | ORAL_TABLET | Freq: Every day | ORAL | Status: DC
  Administered 2020-07-11 – 2020-07-13 (×3): 10 mg via ORAL
  Filled 2020-07-11 (×3): qty 1

## 2020-07-11 MED ORDER — ZOLPIDEM TARTRATE 5 MG PO TABS: 5.0000 mg | ORAL_TABLET | Freq: Every evening | ORAL | Status: DC | PRN

## 2020-07-11 MED ORDER — LABETALOL HCL 5 MG/ML IV SOLN: 10.0000 mg | INTRAVENOUS | Status: DC | PRN

## 2020-07-11 MED ORDER — POTASSIUM CHLORIDE CRYS ER 20 MEQ PO TBCR: 20.0000 meq | EXTENDED_RELEASE_TABLET | Freq: Every day | ORAL | Status: DC | PRN

## 2020-07-11 MED ORDER — MAGNESIUM SULFATE 2 GM/50ML IV SOLN: 2.0000 g | Freq: Every day | INTRAVENOUS | Status: DC | PRN

## 2020-07-11 MED ORDER — FENTANYL CITRATE (PF) 100 MCG/2ML IJ SOLN: 25.0000 ug | INTRAMUSCULAR | Status: DC | PRN

## 2020-07-11 MED ORDER — AMISULPRIDE (ANTIEMETIC) 5 MG/2ML IV SOLN: 10.0000 mg | Freq: Once | INTRAVENOUS | Status: DC | PRN

## 2020-07-11 MED ORDER — PANTOPRAZOLE SODIUM 40 MG PO TBEC
40.0000 mg | DELAYED_RELEASE_TABLET | Freq: Every day | ORAL | Status: DC
  Administered 2020-07-11 – 2020-07-13 (×3): 40 mg via ORAL
  Filled 2020-07-11 (×3): qty 1

## 2020-07-11 MED ORDER — ONDANSETRON HCL 4 MG/2ML IJ SOLN: 4.0000 mg | Freq: Once | INTRAMUSCULAR | Status: DC | PRN

## 2020-07-11 MED ORDER — BICTEGRAVIR-EMTRICITAB-TENOFOV 50-200-25 MG PO TABS
1.0000 | ORAL_TABLET | Freq: Every day | ORAL | Status: DC
  Administered 2020-07-11 – 2020-07-13 (×3): 1 via ORAL
  Filled 2020-07-11 (×3): qty 1

## 2020-07-11 NOTE — Anesthesia Postprocedure Evaluation (Signed)
Anesthesia Post Note  Patient: Jessica [name]  Procedure(s) Performed: THROMBECTOMY AORTA-BIFEMORAL GRAFT, PROFUNDA, AND SUPERFICIAL FEMORAL ARTERY  LEFT LEG (Left Leg Upper) REDO LEFT FEMORAL ARTERY EXPOSURE (Left Leg Upper)     Patient location during evaluation: PACU Anesthesia Type: General Level of consciousness: awake Pain management: pain level controlled Vital Signs Assessment: post-procedure vital signs reviewed and stable Respiratory status: spontaneous breathing, nonlabored ventilation, respiratory function stable and patient connected to nasal cannula oxygen Cardiovascular status: blood pressure returned to baseline and stable Postop Assessment: no apparent nausea or vomiting Anesthetic complications: no   No complications documented.  Last Vitals:  Vitals:   07/11/20 0110 07/11/20 0130  BP: (!) 148/70 (!) 159/69  Pulse: (!) 58 64  Resp: 14 20  Temp:  (!) 36.3 C  SpO2: 97% 96%    Last Pain:  Vitals:   07/11/20 0130  TempSrc: Axillary  PainSc: 5     LLE Motor Response: Purposeful movement (07/11/20 0153) LLE Sensation: Full sensation (07/11/20 0153) RLE Motor Response: Purposeful movement (07/11/20 0153) RLE Sensation: Full sensation (07/11/20 0153)      Jessica [name] Jessica [name]

## 2020-07-11 NOTE — Transfer of Care (Signed)
Immediate Anesthesia Transfer of Care Note  Patient: JOETTE SCHMOKER Manns  Procedure(s) Performed: THROMBECTOMY AORTA-BIFEMORAL GRAFT, PROFUNDA, AND SUPERFICIAL FEMORAL ARTERY  LEFT LEG (Left Leg Upper) REDO LEFT FEMORAL ARTERY EXPOSURE (Left Leg Upper)  Patient Location: PACU  Anesthesia Type:General  Level of Consciousness: awake, alert  and oriented  Airway & Oxygen Therapy: Patient Spontanous Breathing and Patient connected to nasal cannula oxygen  Post-op Assessment: Report given to RN and Post -op Vital signs reviewed and stable  Post vital signs: Reviewed and stable  Last Vitals:  Vitals Value Taken Time  BP 135/43 (ABP)   Temp    Pulse 54 07/11/20 0006  Resp 11 07/11/20 0006  SpO2 100 % 07/11/20 0006  Vitals shown include unvalidated device data.  Last Pain:  Vitals:   07/10/20 2149  TempSrc: Oral  PainSc:          Complications: No complications documented.

## 2020-07-11 NOTE — Progress Notes (Signed)
    Subjective  - POD #1, status post thrombectomy of occluded left limb of her aortobifemoral bypass  She feels much better today   Physical Exam:  Palpable pedal pulses bilaterally. Left groin incision is without hematoma Motor function is back to normal on the left foot with near resolution of her numbness. 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  The patient is nearly back to her baseline.  She does not have any evidence of compartment syndrome.  There is no obvious etiology of her occlusion.  I have restarted her on heparin this morning.  We will mobilize her today with anticipation of discharge tomorrow.  At discharge I will stop her heparin and treat her with aspirin and Plavix.  Continue statin therapy  Wells Nayson Traweek 07/11/2020 10:01 AM --  Vitals:   07/11/20 0607 07/11/20 0841  BP:  125/89  Pulse:  61  Resp:  13  Temp: 97.8 F (36.6 C) 98.7 F (37.1 C)  SpO2:  99%    Intake/Output Summary (Last 24 hours) at 07/11/2020 1001 Last data filed at 07/11/2020 0530 Gross per 24 hour  Intake 1670 ml  Output 1950 ml  Net -280 ml     Laboratory CBC    Component Value Date/Time   WBC 7.8 07/11/2020 0400   HGB 11.4 (L) 07/11/2020 0400   HGB 13.9 08/28/2018 0926   HCT 33.5 (L) 07/11/2020 0400   HCT 40.0 08/28/2018 0926   PLT 198 07/11/2020 0400   PLT 294 08/28/2018 0926    BMET    Component Value Date/Time   NA 141 07/11/2020 0031   NA 142 08/28/2018 0926   K 3.1 (L) 07/11/2020 0031   CL 110 07/11/2020 0031   CO2 22 07/11/2020 0031   GLUCOSE 144 (H) 07/11/2020 0031   BUN 13 07/11/2020 0031   BUN 15 08/28/2018 0926   CREATININE 0.82 07/11/2020 0031   CREATININE 0.71 03/15/2020 0000   CALCIUM 8.8 (L) 07/11/2020 0031   GFRNONAA >60 07/11/2020 0031   GFRNONAA 82 03/15/2020 0000   GFRAA 95 03/15/2020 0000    COAG Lab Results  Component Value  Date   INR 1.2 07/10/2020   INR 0.97 02/08/2012   INR 1.1 05/08/2007   No results found for: PTT  Antibiotics Anti-infectives (From admission, onward)   Start     Dose/Rate Route Frequency Ordered Stop   07/11/20 1000  bictegravir-emtricitabine-tenofovir AF (BIKTARVY) 50-200-25 MG per tablet 1 tablet        1 tablet Oral Daily 07/11/20 0107     07/11/20 0400  ceFAZolin (ANCEF) IVPB 2g/100 mL premix        2 g 200 mL/hr over 30 Minutes Intravenous Every 8 hours 07/11/20 0107 07/11/20 1959   07/11/20 0107  acyclovir (ZOVIRAX) tablet 400 mg        400 mg Oral 3 times daily PRN 07/11/20 0107         V. Leia Alf, M.D., [facility] [place] Office: [phone] Pager:  [phone]

## 2020-07-11 NOTE — Evaluation (Signed)
Physical Therapy Evaluation Patient Details Name: Jessica [name] MRN: [id number] DOB: March 19, 1940 Today's Date: 07/11/2020   History of Present Illness  Jessica [name] is a 80 y.o. 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 Found to have ischemic left leg s/p thrombectomy of left limb of aortobifemoral bypass graft, profunda, and superficial femoral artery  Clinical Impression  Patient presents with decreased mobility due to pain and limited motion/strength in L LE.  She currently needs min to mod A for mobility and is limited with upright due to low BP.  Patient with continued vision deficits due to recent surgery.  She will benefit from skilled PT in the acute setting and from follow up SNF level rehab at d/c.   Follow Up Recommendations Supervision/Assistance - 24 hour;SNF    Equipment Recommendations  None recommended by PT    Recommendations for Other Services       Precautions / Restrictions Precautions Precautions: Fall      Mobility  Bed Mobility Overal bed mobility: Needs Assistance Bed Mobility: Supine to Sit;Sit to Supine     Supine to sit: Mod assist;HOB elevated Sit to supine: Min assist   General bed mobility comments: to sit assist with trunk and L LE, to supine assist for L LE    Transfers Overall transfer level: Needs assistance Equipment used: Rolling walker (2 wheeled) Transfers: Sit to/from Stand Sit to Stand: Mod assist         General transfer comment: lifting help to stand at bedside  Ambulation/Gait Ambulation/Gait assistance: Min assist Gait Distance (Feet): 2 Feet Assistive device: Rolling walker (2 wheeled) Gait Pattern/deviations: Step-to pattern     General Gait Details: side steps to [facility] only due to low BP  Stairs            Wheelchair Mobility     Modified Rankin (Stroke Patients Only)       Balance Overall balance assessment: Needs assistance   Sitting balance-Leahy Scale: Fair     Standing balance support: Bilateral upper extremity supported Standing balance-Leahy Scale: Poor Standing balance comment: UE support and min A for balance                             Pertinent Vitals/Pain Pain Assessment: 0-10 Pain Score: 4  Pain Location: L groin Pain Descriptors / Indicators: Operative site guarding Pain Intervention(s): Monitored during session;Premedicated before session    Home Living Family/patient expects to be discharged to:: Private residence Living Arrangements: Children;Other relatives Available Help at Discharge: Family Type of Home: House Home Access: Stairs to enter Entrance Stairs-Rails: Can reach both Entrance Stairs-Number of Steps: 5 Home Layout: One level Home Equipment: Hand held shower head;Shower seat;Bedside commode Additional Comments: 2 falls recent partially due to vascular issues    Prior Function Level of Independence: Independent with assistive device(s)         Comments: uses cane, usually cooks, but not since eye surgery 3-4 weeks removed cataract on R and blisters removed off L     Hand Dominance        Extremity/Trunk Assessment   Upper Extremity Assessment Upper Extremity Assessment: Generalized weakness    Lower Extremity Assessment Lower Extremity Assessment: LLE deficits/detail;RLE deficits/detail RLE Deficits / Details: AROM WFL, strength hip flexion 3+/5, knee extension 4-/5, ankle  DF 4/5 LLE Deficits / Details: Limited due to pain, AAROM grossly limited 50%, strength hip flexion 2-/5, knee extension 3-/5, ankle DF 4-/5 LLE: Unable to fully assess due to pain       Communication   Communication: No difficulties  Cognition Arousal/Alertness: Awake/alert Behavior During Therapy: WFL for tasks assessed/performed Overall Cognitive Status: History of  cognitive impairments - at baseline                                 General Comments: slight impaired memory      General Comments General comments (skin integrity, edema, etc.): BP 80's/50's sitting/standing    Exercises General Exercises - Lower Extremity Ankle Circles/Pumps: AROM;Both;5 reps;Supine Heel Slides: AAROM;Left;5 reps   Assessment/Plan    PT Assessment Patient needs continued PT services  PT Problem List Decreased strength;Decreased mobility;Decreased range of motion;Decreased activity tolerance;Decreased balance;Decreased knowledge of use of DME;Pain       PT Treatment Interventions Therapeutic activities;DME instruction;Therapeutic exercise;Gait training;Balance training;Functional mobility training;Stair training    PT Goals (Current goals can be found in the Care Plan section)  Acute Rehab PT Goals Patient Stated Goal: to go home tomorrow PT Goal Formulation: With patient Time For Goal Achievement: 07/25/20 Potential to Achieve Goals: Good    Frequency Min 3X/week   Barriers to discharge Decreased caregiver support son works and sister also with visual deficits    Co-evaluation               AM-PAC PT "6 Clicks" Mobility  Outcome Measure Help needed turning from your back to your side while in a flat bed without using bedrails?: A Little Help needed moving from lying on your back to sitting on the side of a flat bed without using bedrails?: A Little Help needed moving to and from a bed to a chair (including a wheelchair)?: A Little Help needed standing up from a chair using your arms (e.g., wheelchair or bedside chair)?: A Little Help needed to walk in hospital room?: A Lot Help needed climbing 3-5 steps with a railing? : A Lot 6 Click Score: 16    End of Session   Activity Tolerance: Patient limited by pain;Treatment limited secondary to medical complications (Comment) Patient left: in bed;with call bell/phone within reach    PT Visit Diagnosis: Other abnormalities of gait and mobility (R26.89);Pain Pain - Right/Left: Left Pain - part of body: Hip    Time: 9811-9147 PT Time Calculation (min) (ACUTE ONLY): 18 min   Charges:   PT Evaluation $PT Eval Moderate Complexity: 1 Mod          Cyndi Cacie Gaskins, PT Acute Rehabilitation Services [facility]:[phone] [phone] 07/11/2020   Reginia Naas 07/11/2020, 1:43 PM

## 2020-07-11 NOTE — TOC Initial Note (Deleted)
Transition of Care [facility]) - Initial/Assessment Note    Patient Details  Name: Jessica [name] MRN: [id number] Date of Birth: [birth year]  Transition of Care [facility]) CM/SW Contact:    Bary Castilla, LCSW Phone Number: [phone] 07/11/2020, 3:47 PM  Clinical Narrative:                  CSW met with patient to discuss PT recommendation of a SNF. Patient was aware of recommendation and in agreement with going to a ST SNF. CSW discussed the SNF process.CSW provided patient with medicare.gov rating list.  Patient gave CSW permission to fax referrals out to local facilities near [place]. CSW answered questions about the SNF process and the next steps in the process. CSW informed pt that it was important for her to participate in PT because it would be needed to start her insurance authorization.Patient has been vaccinated however had not had booster.  TOC team will continue to assist with discharge planning needs.     Expected Discharge Plan: Skilled Nursing Facility Barriers to Discharge: Insurance Authorization,Continued Medical Work up,SNF Pending bed offer   Patient Goals and CMS Choice Patient states their goals for this hospitalization and ongoing recovery are:: To be able to go home CMS Medicare.gov Compare Post Acute Care list provided to:: Patient Choice offered to / list presented to : Patient  Expected Discharge Plan and Services Expected Discharge Plan: [place] arrangements for the past 2 months: Single Family Home                                      Prior Living Arrangements/Services Living arrangements for the past 2 months: Single Family Home Lives with:: Siblings Patient language and need for interpreter reviewed:: Yes Do you feel safe going back to the place where you live?: Yes        Care giver support system in place?: Yes (comment)      Activities of Daily Living      Permission Sought/Granted    Permission granted to share information with : Yes, Verbal Permission Granted  Share Information with NAME: [name]  Permission granted to share info w AGENCY: SNFs  Permission granted to share info w Relationship: Daughter  Permission granted to share info w Contact Information: [phone]  Emotional Assessment Appearance:: Appears stated age Attitude/Demeanor/Rapport: Gracious Affect (typically observed): Accepting,Adaptable Orientation: : Oriented to Self,Oriented to Place,Oriented to  Time,Oriented to Situation      Admission diagnosis:  Status post surgery [Z98.890] 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 Status post surgery 07/10/2020  . Headache 10/20/2019  . Insomnia 10/20/2019  . History of vitamin D deficiency 05/29/2018  . Opiate dependence ([place]) 04/12/2018  . Vasovagal syncope 01/04/2018  . Diastolic dysfunction 09/03/7626  . Marijuana use 07/03/2016  . Bilateral sensorineural hearing loss 06/11/2014  . 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 Chronic sinusitis 04/02/2013  . Right rotator cuff tear 02/01/2013  . 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 Generalized anxiety disorder 04/05/2012  . Healthcare maintenance 04/05/2012  . Hyperlipidemia 04/05/2012  . Smoker 02/19/2012  . History of small bowel obstruction 02/08/2012  . Diverticulosis of colon without hemorrhage 02/08/2012  . 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  [place]) 11/01/2011  . Constipation due to pain medication 04/27/2010  . Genital herpes 07/20/2006  . 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 Essential hypertension 07/20/2006  . Lumbar degenerative disc disease 07/20/2006  . History of abnormal cervical Pap smear 07/20/2006  . Human immunodeficiency virus disease ([place]) 03/27/1986   PCP:  Sid Falcon, MD Pharmacy:   [facility], [place] [address] [place] [address] Phone: [phone] Fax: [phone]  [facility] [place], [place] [address] [place] [address] Phone: [phone] Fax: [phone]     Social Determinants of Health (SDOH) Interventions    Readmission Risk Interventions No flowsheet data found.

## 2020-07-11 NOTE — Op Note (Signed)
Patient name: Jessica [name] MRN: [id number] DOB: 11/20/1940 Sex: female  07/10/2020 Pre-operative Diagnosis: Ischemic left leg Post-operative diagnosis:  Same Surgeon:  Annamarie Major Assistants:  Ivin Booty Procedure:   #1: Redo left femoral artery exposure   #2: Thrombectomy of left limb of aortobifemoral bypass graft, profunda, and superficial femoral artery Anesthesia: General Blood Loss: 200 cc Specimens: Thrombus to pathology  Findings: Acute thrombosis of left limb of the aortobifemoral graft.  After removal of the thrombus, there was no obvious etiology for the occlusion.  No significant stenosis was noted at the origin of the profunda or superficial femoral artery.  I opened the hood of the left femoral anastomosis of the aortobifemoral graft longitudinally.  This was closed primarily  Indications: This is a 80 year old female with a remote history of an aortobifemoral bypass graft performed for severe claudication.  Approximately 2 PM this afternoon, she developed pain and weakness of her left leg.  She presented to [facility] and was immediately transferred to [facility].  On arrival, she did not have a pulse in her groin or her foot.  She had no motor or sensory function.  CT scan showed occlusion of the left limb of her graft.  I discussed proceeding with emergent revascularization  Procedure:  The patient was identified in the holding area and taken to [place] [address]  The patient was then placed supine on the table. general anesthesia was administered.  The patient was prepped and draped in the usual sterile fashion.  A time out was called and antibiotics were administered.  A PA was necessary to expedite the procedure and assist with technical details.  The patient's previous longitudinal left groin incision was opened with a 10 blade.  Cautery was used divide subcutaneous tissue.  Using sharp and cautery dissection, I dissected out the left limb of the aortobifemoral graft  and encircled this with a vessel loop.  I also dissected out the proximal left common femoral artery.  I then exposed the superficial femoral artery for approximately 3 cm in the profundofemoral artery for approximately 2 cm.  Once I had adequate exposure, the patient was fully heparinized.  Heparin levels were checked with ACT measurements.  The arteries were then occluded.  On the hood of the graft, I made a longitudinal graftotomy which was extended down to the toe of the anastomosis with Potts scissors.  There was fresh thrombus within the common femoral artery.  I then passed a 5 Fogarty catheter up the left limb of the aortobifemoral graft and performed thrombectomy.  Acute thrombus was evacuated and sent for pathology.  After the first pass, there was excellent inflow.  I then performed another pass of the 5 Fogarty with no thrombus evacuated.  The graft was then occluded with a Henley clamp.  I then passed a #4 Fogarty catheter down the profundofemoral artery and evacuated thrombus and got good backbleeding.  A second negative pass was performed.  The profunda was then flushed with heparin saline and occluded.  The #4 Fogarty catheter was then advanced down the superficial femoral artery.  I was able to pass the Fogarty throughout its entirety.  Thrombectomy was performed and acute thrombus was evacuated.  Backbleeding was reestablished.  2 additional negative passes were performed.  The superficial femoral artery was then occluded.  I then inspected the common femoral artery and the anastomosis as well as the origin of the profunda and superficial femoral artery.  There was no obvious  stenosis.  Next, I closed the graftotomy with a running 5-0 Prolene and reestablish blood flow to the left leg.  At this point the patient had brisk Doppler signals at the origin of the profunda and superficial femoral artery as well as multiphasic signals in the posterior tibial and anterior tibial artery.  The patient's  heparin was then reversed with 50 mg of protamine.  Hemostasis was then achieved.  I then closed with 2-0 Vicryl closing the femoral sheath and then additional layers of Vicryl for the subcutaneous tissue followed by subcuticular closure.  Dermabond was placed on the incision.  At this point, the patient's calf was very soft.  I elected not to do fasciotomies at this time.  The patient was then successfully extubated and taken recovery in stable condition.   Disposition: To PACU stable.   Theotis Burrow, M.D., [facility] [place] Office: [phone] Pager:  [phone]

## 2020-07-11 NOTE — Progress Notes (Signed)
Patient complains of minimal pain after surgery. Able to doppler Right DP and PT and Left DP and PT in PACU.

## 2020-07-11 NOTE — NC FL2 (Signed)
[place] LEVEL OF CARE SCREENING TOOL     IDENTIFICATION  Patient Name: Jessica [name] Birthdate: [birth year] Sex: female Admission Date (Current Location): 07/10/2020  [facility] and [place] Number:  Herbalist and Address:  [facility], [place] [address], [place], [place] [address]      Provider Number: [id number]  Attending Physician Name and Address:  Serafina Mitchell, MD  Relative Name and Phone Number:  [name]    Current Level of Care: Hospital Recommended Level of Care: [place] Prior Approval Number:    Date Approved/Denied:   PASRR Number: [id number]  Discharge Plan: SNF    Current Diagnoses: Patient Active Problem List   Diagnosis Date Noted  . 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 Status post surgery 07/10/2020  . Headache 10/20/2019  . Insomnia 10/20/2019  . History of vitamin D deficiency 05/29/2018  . Opiate dependence ([place]) 04/12/2018  . Vasovagal syncope 01/04/2018  . Diastolic dysfunction 56/38/7564  . Marijuana use 07/03/2016  . Bilateral sensorineural hearing loss 06/11/2014  . 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 Chronic sinusitis 04/02/2013  . Right rotator cuff tear 02/01/2013  . 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 Generalized anxiety disorder 04/05/2012  . Healthcare maintenance 04/05/2012  . Hyperlipidemia 04/05/2012  . Smoker 02/19/2012  . History of small bowel obstruction 02/08/2012  . Diverticulosis of colon without hemorrhage 02/08/2012  . 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[place]) 11/01/2011  . Constipation due to pain medication 04/27/2010  . Genital herpes 07/20/2006  . 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 Essential hypertension 07/20/2006  . Lumbar degenerative disc disease 07/20/2006  . History of abnormal cervical Pap smear 07/20/2006  . Human immunodeficiency virus disease ([place]) 03/27/1986    Orientation  RESPIRATION BLADDER Height & Weight     Situation,Self,Time,Place  O2 (2l) Continent Weight: 122 lb (55.3 kg) Height:  5\' 7"  (170.2 cm)  BEHAVIORAL SYMPTOMS/MOOD NEUROLOGICAL BOWEL NUTRITION STATUS      Continent Diet (See discharge summary)  AMBULATORY STATUS COMMUNICATION OF NEEDS Skin   Limited Assist Verbally Surgical wounds (groin left)                       Personal Care Assistance Level of Assistance  Bathing,Feeding,Dressing Bathing Assistance: Limited assistance Feeding assistance: Limited assistance Dressing Assistance: Limited assistance     Functional Limitations Info  Sight,Hearing,Speech Sight Info: Impaired (blind in left eye, 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 0.9 %  sodium chloride infusion  500 mL Intravenous Once PRN Baglia, Corrina, PA-C      . 0.9 %  sodium chloride infusion   Intravenous Continuous Baglia, Corrina, PA-C 100 mL/hr at 07/11/20 1238 New Bag at 07/11/20 1238  . acetaminophen (TYLENOL) tablet 325-650 mg  325-650 mg Oral Q4H PRN Baglia, Corrina, PA-C  Or  . acetaminophen (TYLENOL) suppository 325-650 mg  325-650 mg Rectal Q4H PRN Baglia, Corrina, PA-C      . acyclovir (ZOVIRAX) tablet 400 mg  400 mg Oral TID PRN Baglia, Corrina, PA-C      . alum & mag hydroxide-simeth (MAALOX/MYLANTA) 200-200-20 MG/5ML suspension 15-30 mL  15-30 mL Oral Q2H PRN Baglia, Corrina, PA-C      . amLODipine  (NORVASC) tablet 10 mg  10 mg Oral Daily Baglia, Corrina, PA-C   10 mg at 07/11/20 0843  . aspirin chewable tablet 81 mg  81 mg Oral Daily Baglia, Corrina, PA-C   81 mg at 07/11/20 0843  . atorvastatin (LIPITOR) tablet 10 mg  10 mg Oral QHS Baglia, Corrina, PA-C   10 mg at 07/11/20 0221  . atropine 1 % ophthalmic solution 1 drop  1 drop Both Eyes BID Baglia, Corrina, PA-C   1 drop at 07/11/20 0845  . benazepril (LOTENSIN) tablet 40 mg  40 mg Oral Daily Baglia, Corrina, PA-C   40 mg at 07/11/20 0845  . bictegravir-emtricitabine-tenofovir AF (BIKTARVY) 50-200-25 MG per tablet 1 tablet  1 tablet Oral Daily Baglia, Corrina, PA-C   1 tablet at 07/11/20 0846  . bisacodyl (DULCOLAX) EC tablet 5 mg  5 mg Oral Daily PRN Baglia, Corrina, PA-C      . docusate sodium (COLACE) capsule 100 mg  100 mg Oral Daily Baglia, Corrina, PA-C   100 mg at 07/11/20 0843  . dorzolamide (TRUSOPT) 2 % ophthalmic solution 1 drop  1 drop Both Eyes BID Baglia, Corrina, PA-C   1 drop at 07/11/20 0848  . guaiFENesin-dextromethorphan (ROBITUSSIN DM) 100-10 MG/5ML syrup 15 mL  15 mL Oral Q4H PRN Baglia, Corrina, PA-C      . heparin ADULT infusion 100 units/mL (25000 units/234mL)  950 Units/hr Intravenous Continuous Baglia, Corrina, PA-C 9.5 mL/hr at 07/11/20 0532 950 Units/hr at 07/11/20 0532  . hydrALAZINE (APRESOLINE) injection 5 mg  5 mg Intravenous Q20 Min PRN Baglia, Corrina, PA-C      . HYDROmorphone (DILAUDID) injection 0.5-1 mg  0.5-1 mg Intravenous Q2H PRN Baglia, Corrina, PA-C      . labetalol (NORMODYNE) injection 10 mg  10 mg Intravenous Q10 min PRN Baglia, Corrina, PA-C      . latanoprost (XALATAN) 0.005 % ophthalmic solution 1 drop  1 drop Both Eyes QHS Baglia, Corrina, PA-C   1 drop at 07/11/20 0222  . magnesium sulfate IVPB 2 g 50 mL  2 g Intravenous Daily PRN Baglia, Corrina, PA-C      . metoprolol tartrate (LOPRESSOR) injection 2-5 mg  2-5 mg Intravenous Q2H PRN Baglia, Corrina, PA-C      . ofloxacin (OCUFLOX) 0.3 %  ophthalmic solution 1 drop  1 drop Right Eye QID Baglia, Corrina, PA-C   1 drop at 07/11/20 1335  . ondansetron (ZOFRAN) injection 4 mg  4 mg Intravenous Q6H PRN Baglia, Corrina, PA-C      . oxyCODONE-acetaminophen (PERCOCET/ROXICET) 5-325 MG per tablet 1-2 tablet  1-2 tablet Oral Q4H PRN Baglia, Corrina, PA-C   2 tablet at 07/11/20 0913  . pantoprazole (PROTONIX) EC tablet 40 mg  40 mg Oral Daily Baglia, Corrina, PA-C   40 mg at 07/11/20 0842  . phenol (CHLORASEPTIC) mouth spray 1 spray  1 spray Mouth/Throat PRN Baglia, Corrina, PA-C      . potassium chloride SA (KLOR-CON) CR tablet 20-40 mEq  20-40 mEq Oral Daily PRN Baglia, Corrina, PA-C      . prednisoLONE acetate (  PRED FORTE) 1 % ophthalmic suspension 1 drop  1 drop Right Eye 6 X Daily Baglia, Corrina, PA-C   1 drop at 07/11/20 1437  . [START ON 07/12/2020] Rimegepant Sulfate TBDP 75 mg  75 mg Oral QODAY Baglia, Corrina, PA-C      . senna-docusate (Senokot-S) tablet 1 tablet  1 tablet Oral QHS PRN Baglia, Corrina, PA-C      . sodium chloride 0.9 % bolus 1,000 mL  1,000 mL Intravenous Once Baglia, Corrina, PA-C      . terazosin (HYTRIN) capsule 5 mg  5 mg Oral QHS Baglia, Corrina, PA-C   5 mg at 07/11/20 0222  . zolpidem (AMBIEN) tablet 5 mg  5 mg Oral QHS PRN Baglia, Corrina, PA-C         Discharge Medications: Please see discharge summary for a list of discharge medications.  Relevant Imaging Results:  Relevant Lab Results:   Additional Information SSN# [id number] pt has been vaccinated and had one booster  Bary Castilla, LCSW

## 2020-07-11 NOTE — TOC Initial Note (Addendum)
Transition of Care [facility]) - Initial/Assessment Note    Patient Details  Name: Jessica [name] MRN: [id number] Date of Birth: 1940-03-14  [facility]) CM/SW Contact:    Bary Castilla, LCSW Phone Number: [phone] 07/11/2020, 2:49 PM  Clinical Narrative:                  CSW met with patient and pt's daughter [name] to discuss PT recommendation of a SNF. Patient was aware of recommendation and in agreement with going to a ST SNF. CSW discussed the SNF process.CSW provided patient with medicare.gov rating list.  Patient gave CSW permission to fax referrals out to local facilities in [place].CSW answered questions about the SNF process and the next steps in the process. They prefer facilities in [place] and are in agreement with referrals being sent to [facility], [facility] and [facility]  Pt has been vaccinated and has had one booster.  TOC team will continue to assist with discharge planning needs.    Expected Discharge Plan: Skilled Nursing Facility Barriers to Discharge: Insurance Authorization,Continued Medical Work up,SNF Pending bed offer   Patient Goals and CMS Choice Patient states their goals for this hospitalization and ongoing recovery are:: To be able to go home CMS Medicare.gov Compare Post Acute Care list provided to:: Patient Choice offered to / list presented to : Patient  Expected Discharge Plan and Services Expected Discharge Plan: [place] arrangements for the past 2 months: Single Family Home                                      Prior Living Arrangements/Services Living arrangements for the past 2 months: Single Family Home Lives with:: Siblings Patient language and need for interpreter reviewed:: Yes Do you feel safe going back to the place where you live?: Yes        Care giver support system in place?: Yes (comment)      Activities of Daily Living      Permission Sought/Granted    Permission granted to share information with : Yes, Verbal Permission Granted  Share Information with NAME: [name]  Permission granted to share info w AGENCY: SNFs  Permission granted to share info w Relationship: Daughter  Permission granted to share info w Contact Information: [phone]  Emotional Assessment Appearance:: Appears stated age Attitude/Demeanor/Rapport: Gracious Affect (typically observed): Accepting,Adaptable Orientation: : Oriented to Self,Oriented to Place,Oriented to  Time,Oriented to Situation      Admission diagnosis:  Status post surgery [Z98.890] 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 Status post surgery 07/10/2020  . Headache 10/20/2019  . Insomnia 10/20/2019  . History of vitamin D deficiency 05/29/2018  . Opiate dependence ([place]) 04/12/2018  . Vasovagal syncope 01/04/2018  . Diastolic dysfunction 61/68/3729  . Marijuana use 07/03/2016  . Bilateral sensorineural hearing loss 06/11/2014  . 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 Chronic sinusitis 04/02/2013  . Right rotator cuff tear 02/01/2013  . 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 Generalized anxiety disorder 04/05/2012  . Healthcare maintenance 04/05/2012  . Hyperlipidemia 04/05/2012  . Smoker 02/19/2012  . History of small bowel obstruction 02/08/2012  . Diverticulosis of colon without hemorrhage 02/08/2012  . Peripheral vascular  occlusive disease ([place]) 11/01/2011  . Constipation due to pain medication 04/27/2010  . Genital herpes 07/20/2006  . 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 Essential hypertension 07/20/2006  . Lumbar degenerative disc disease 07/20/2006  . History of abnormal cervical Pap smear 07/20/2006  . Human immunodeficiency virus disease ([place]) 03/27/1986   PCP:  Sid Falcon, MD Pharmacy:   [place], [place] [address] [place] [address] Phone: [phone] Fax: [phone]  [facility] [place], [place] [address] Phone: [phone] Fax: [phone]     Social Determinants of Health (SDOH) Interventions    Readmission Risk Interventions No flowsheet data found.

## 2020-07-11 NOTE — Progress Notes (Addendum)
[place] for IV heparin Indication: lower extremity ischemia   Allergies  Allergen Reactions  . Hctz [Hydrochlorothiazide] Other (See Comments)    Dizziness, syncope; does NOT wish to take anymore    Patient Measurements: Height: 5\' 7"  (170.2 cm) Weight: 55.3 kg (122 lb) IBW/kg (Calculated) : 61.6 Heparin Dosing Weight: 55.3kg   Vital Signs: Temp: 99.3 F (37.4 C) (05/01 1120) Temp Source: Oral (05/01 1120) BP: 93/55 (05/01 1120) Pulse Rate: 54 (05/01 1120)  Labs: Recent Labs    07/10/20 2022 07/11/20 0031 07/11/20 0400 07/11/20 1417  HGB 13.7 10.8* 11.4*  --   HCT 41.0 32.4* 33.5*  --   PLT 199 175 198  --   LABPROT 14.7  --   --   --   INR 1.2  --   --   --   HEPARINUNFRC  --   --   --  0.63  CREATININE 1.01* 0.82  --   --     Estimated Creatinine Clearance: 50.2 mL/min (by C-G formula based on SCr of 0.82 mg/dL).   Medical History: Past Medical History:  Diagnosis Date  . Acute renal failure (ARF) ([place]) 08/17/2017  . Anxiety 04/05/2012  . Bilateral sensorineural hearing loss 06/11/2014   Mild to moderate on the left side and slight to mild on the right side per audiometry 05/2014.  Hearing aides with possible masking of tinnitus recommended but patient wished to defer secondary to finances.  . Blood transfusion without reported diagnosis    pt denies  . Bursitis of right shoulder 07/12/2012   s/p shoulder injection 07/12/2012   . Cataract of right eye    REMOVED RIGHT EYE 4-19   . Constipation due to pain medication 04/27/2010  . Diverticulosis 02/08/2012   Extensive left-sided diverticula on colonoscopy March 2012 per Dr. Gala Romney   . Essential hypertension 07/20/2006  . Genital herpes 07/20/2006  . 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 Headache 10/20/2019  . Heart murmur 1961  . Human immunodeficiency virus disease ([place]) 03/27/1986  . Hyperlipidemia LDL goal < 100 04/05/2012  . Hypokalemia 04/12/2018  . Insomnia 10/20/2019  .  Long-term current use of opiate analgesic 03/17/2016  . Loose stools 04/08/2019  . Lumbar degenerative disc disease 07/20/2006   With chronic back pain   . Marijuana use 07/03/2016  . Micturition syncope 09/20/2015  . Nausea and vomiting 04/08/2019  . Opiate dependence ([place]) 04/12/2018  . 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[place]) 11/01/2011   s/p aortobifem bypass 2009   . Periumbilical hernia 09/14/1698   1 cm left periumbilical abdominal wall defect  . 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 Right rotator cuff tear 02/01/2013   Responds to periodic steroid injections  . Seborrhea 09/01/2010  . Shoulder pain, right 12/04/2017  . Small bowel obstruction due to adhesions ([place]) 02/08/2012   s/p Exploratory laparotomy, lysis of adhesions 02/12/12    . 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 Tobacco abuse 02/19/2012  . Tobacco abuse   . Vasovagal syncope 02/15/2015  . Vitamin D deficiency 05/29/2018   Vitamin D 18.96 (04/30/2018), treated with ergocalciferol 50,000 units PO QWk X 4 weeks  . Voiding dysfunction    s/p cystoscopy and meatal dilation Dec 2005    Medications:  Infusions:  . sodium chloride    . sodium chloride 100 mL/hr at 07/11/20 1238  . heparin Stopped (07/11/20 0554)  .  magnesium sulfate bolus IVPB    . sodium chloride      Assessment: 79yoF presenting with decreased pulse in L foot, concerned for lower extremity ischemia. Pt is not on anticoagulation PTA. CBC wnl. Pharmacy has been consulted to dose IV heparin. She is now s/p OR today with thrombectomy of occluded left limb of her aortobifemoral bypass and heparin was restarted this morning. Plans noted for aspirin and plavix closer to discharge.  -heparin level= 0.63 -hg= 11.4  Goal of Therapy:  Heparin level 0.3-0.7 units/ml Monitor platelets by anticoagulation protocol: Yes   Plan:  -Continue heparin at 950 units/hr -Recheck heparin level at 8:30pm -Daily  heparin level and CBC  Hildred Laser, PharmD Clinical Pharmacist **Pharmacist phone directory can now be found on [email or web address] (PW [email or web address]).  Listed under [place].   Addendum -heparin level confirmed at goal  Plan -Continue heparin at 950 units/hr -Daily heparin level and CBC  Hildred Laser, PharmD Clinical Pharmacist **Pharmacist phone directory can now be found on [email or web address] (PW [email or web address]).  Listed under [place].

## 2020-07-11 NOTE — Evaluation (Signed)
Occupational Therapy Evaluation Patient Details Name: Jessica [name] MRN: [id number] DOB: [birth year] Today's Date: 07/11/2020    History of Present Illness Jessica [name] is a 80 y.o. 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 Found to have ischemic left leg s/p thrombectomy of left limb of aortobifemoral bypass graft, profunda, and superficial femoral artery   Clinical Impression   This 80 yo female admitted and underwent above presents to acute OT with PLOF of being able to do all her basic ADLs, most IADLs, and cook (despite being  Totally blind in one eye and legally blind in the other). Currently she is Mod A-setup/S for basic ADLs and min A when she is up on her feet. She does not have anyone that can A her at home and thus would benefit from rehab stay prior to going home.We will continue to follow.    Follow Up Recommendations  SNF;Supervision/Assistance - 24 hour    Equipment Recommendations  None recommended by OT       Precautions / Restrictions Precautions Precautions: Fall      Mobility Bed Mobility Overal bed mobility: Needs Assistance Bed Mobility: Supine to Sit;Sit to Supine     Supine to sit: Min guard;HOB elevated Sit to supine: Min guard      Transfers Overall transfer level: Needs assistance Equipment used: Rolling walker (2 wheeled) Transfers: Sit to/from Stand Sit to Stand: Min assist            Balance Overall balance assessment: Needs assistance Sitting-balance support: No upper extremity supported;Feet supported Sitting balance-Leahy Scale: Good     Standing balance support: Bilateral upper extremity supported Standing balance-Leahy Scale: Poor Standing balance comment: UE support and min A for balance                            ADL either performed or assessed with clinical judgement   ADL Overall ADL's : Needs assistance/impaired Eating/Feeding: Set up;Sitting Eating/Feeding Details (indicate cue type and reason): EOB; post directing her where all the items are Grooming: Set up;Supervision/safety;Sitting Grooming Details (indicate cue type and reason): EOB; post directing her where all the items are Upper Body Bathing: Set up;Supervision/ safety;Sitting Upper Body Bathing Details (indicate cue type and reason): EOB; post directing her where all the items are Lower Body Bathing: Moderate assistance Lower Body Bathing Details (indicate cue type and reason): min A sit<>stand Upper Body Dressing : Set up;Supervision/safety Upper Body Dressing Details (indicate cue type and reason): EOB; post directing her where all the items are Lower Body Dressing: Moderate assistance Lower Body Dressing Details (indicate cue type and reason): EOB; post directing her where all the items are Toilet Transfer: Minimal assistance;Ambulation;RW   Toileting- Clothing Manipulation and Hygiene: Minimal assistance;Sit to/from stand               Vision Baseline Vision/History: Legally blind Additional Comments: left eye totally blind, right eye legally blind            Pertinent Vitals/Pain Pain Assessment: 0-10 Pain Score: 4  Pain Location: L groin Pain Descriptors / Indicators: Operative site guarding Pain Intervention(s): Monitored during session;Premedicated before session     Hand Dominance  right   Extremity/Trunk Assessment Upper Extremity Assessment Upper Extremity Assessment: Generalized weakness  Communication Communication Communication: No difficulties   Cognition Arousal/Alertness: Awake/alert Behavior During Therapy: WFL for tasks assessed/performed Overall Cognitive Status: History of cognitive impairments - at baseline                                 General  Comments: slight impaired memory        Home Living Family/patient expects to be discharged to:: Private residence Living Arrangements: Children;Other relatives Available Help at Discharge: Family Type of Home: House Home Access: Stairs to enter [employer] of Steps: 5 Entrance Stairs-Rails: Can reach both Home Layout: One level     Bathroom Shower/Tub: Tub/shower unit         Home Equipment: Hand held shower head;Shower seat;Bedside commode   Additional Comments: 2 falls recent partially due to vascular issues      Prior Functioning/Environment Level of Independence: Independent with assistive device(s)        Comments: uses cane, usually cooks, but not since eye surgery 3-4 weeks removed cataract on R and blisters removed off L        OT Problem List: Decreased strength;Decreased range of motion;Impaired balance (sitting and/or standing);Pain;Impaired vision/perception      OT Treatment/Interventions: Self-care/ADL training;DME and/or AE instruction;Patient/family education;Balance training    OT Goals(Current goals can be found in the care plan section) Acute Rehab OT Goals Patient Stated Goal: to go to rehab then home OT Goal Formulation: With patient Time For Goal Achievement: 07/25/20 Potential to Achieve Goals: Good  OT Frequency: Min 2X/week              AM-PAC OT "6 Clicks" Daily Activity     Outcome Measure Help from another person eating meals?: A Little Help from another person taking care of personal grooming?: A Little Help from another person toileting, which includes using toliet, bedpan, or urinal?: A Little Help from another person bathing (including washing, rinsing, drying)?: A Lot Help from another person to put on and taking off regular upper body clothing?: A Little Help from another person to put on and taking off regular lower body clothing?: A Lot 6 Click Score: 16   End of Session Equipment Utilized During Treatment:  Gait belt;Rolling walker  Activity Tolerance: Patient tolerated treatment well Patient left: in bed;with call bell/phone within reach;with bed alarm set  OT Visit Diagnosis: Unsteadiness on feet (R26.81);Other abnormalities of gait and mobility (R26.89);Muscle weakness (generalized) (M62.81);Pain;Low vision, both eyes (H54.2) Pain - Right/Left: Left Pain - part of body: Leg                Time: 2951-8841 OT Time Calculation (min): 35 min Charges:  OT General Charges $OT Visit: 1 Visit OT Evaluation $OT Eval Moderate Complexity: 1 Mod OT Treatments $Self Care/Home Management : 8-22 mins  [place], OTR/L Acute [employer] Pager [phone] Office [phone]     Almon Register 07/11/2020, 1:55 PM

## 2020-07-12 ENCOUNTER — Other Ambulatory Visit (HOSPITAL_COMMUNITY): Payer: Medicare Other

## 2020-07-12 LAB — CBC
HCT: 27.8 % — ABNORMAL LOW (ref 36.0–46.0)
Hemoglobin: 9.3 g/dL — ABNORMAL LOW (ref 12.0–15.0)
MCH: 33.3 pg (ref 26.0–34.0)
MCHC: 33.5 g/dL (ref 30.0–36.0)
MCV: 99.6 fL (ref 80.0–100.0)
Platelets: 155 10*3/uL (ref 150–400)
RBC: 2.79 MIL/uL — ABNORMAL LOW (ref 3.87–5.11)
RDW: 13.4 % (ref 11.5–15.5)
WBC: 5.8 10*3/uL (ref 4.0–10.5)
nRBC: 0 % (ref 0.0–0.2)

## 2020-07-12 LAB — HEPARIN LEVEL (UNFRACTIONATED)
Heparin Unfractionated: 0.73 IU/mL — ABNORMAL HIGH (ref 0.30–0.70)
Heparin Unfractionated: 0.45 IU/mL (ref 0.30–0.70)

## 2020-07-12 LAB — SARS CORONAVIRUS 2 (TAT 6-24 HRS): SARS Coronavirus 2: NEGATIVE

## 2020-07-12 MED ORDER — POTASSIUM CHLORIDE CRYS ER 20 MEQ PO TBCR
40.0000 meq | EXTENDED_RELEASE_TABLET | Freq: Four times a day (QID) | ORAL | Status: AC
  Administered 2020-07-12 (×2): 40 meq via ORAL
  Filled 2020-07-12: qty 2

## 2020-07-12 MED ORDER — TRAMADOL HCL 50 MG PO TABS
100.0000 mg | ORAL_TABLET | Freq: Three times a day (TID) | ORAL | Status: DC | PRN
  Administered 2020-07-12 – 2020-07-13 (×4): 100 mg via ORAL
  Filled 2020-07-12 (×4): qty 2

## 2020-07-12 NOTE — TOC Progression Note (Signed)
Transition of Care [facility]) - Progression Note    Patient Details  Name: Arleth Mccullar MRN: [id number] Date of Birth: [birth year]  Transition of [facility]) CM/SW [place], [place] Phone Number: 07/12/2020, 2:57 PM  Clinical Narrative:     Patient and family accepted bed offer with [facility].  SNF confirmed bed offer  Auth started, reference # [id number] RN informed covid test requested  Thurmond Butts, MSW, LCSW Clinical Social Worker   Expected Discharge Plan: Skilled Nursing Facility Barriers to Discharge: Insurance Authorization,Continued Medical Work up,SNF Pending bed offer  Expected Discharge Plan and Services Expected Discharge Plan: [place] arrangements for the past 2 months: Single Family Home                                       Social Determinants of Health (SDOH) Interventions    Readmission Risk Interventions No flowsheet data found.

## 2020-07-12 NOTE — Progress Notes (Signed)
Mobility Specialist: Progress Note   07/12/20 1534  Mobility  Activity Ambulated in hall  Level of Assistance Minimal assist, patient does 75% or more  Assistive Device Front wheel walker  Distance Ambulated (ft) 180 ft  Mobility Response Tolerated well  Mobility performed by Mobility specialist  $Mobility charge 1 Mobility   Post-Mobility: 81 HR, 93% SpO2  Pt minA to stand and minA during ambulation. Pt with continued vision deficits, requiring minA for RW guidance. Pt took a brief standing break after 40ft due to fatigue but said she wanted to make it to the end of the hallway and back. Pt was able to finish walk with only the one standing break. Pt back to bed after walk with family present in room.   [facility] Yordy Matton Mobility Specialist Mobility Specialist Phone: [phone]

## 2020-07-12 NOTE — Progress Notes (Signed)
[place] for IV heparin Indication: lower extremity ischemia   Allergies  Allergen Reactions  . Hctz [Hydrochlorothiazide] Other (See Comments)    Dizziness, syncope; does NOT wish to take anymore    Patient Measurements: Height: 5\' 7"  (170.2 cm) Weight: 55.3 kg (122 lb) IBW/kg (Calculated) : 61.6 Heparin Dosing Weight: 55.3kg   Vital Signs: Temp: 99.3 F (37.4 C) (05/02 1809) Temp Source: Oral (05/02 1809) BP: 140/67 (05/02 1809) Pulse Rate: 75 (05/02 1809)  Labs: Recent Labs    07/10/20 2022 07/11/20 0031 07/11/20 0400 07/11/20 1417 07/11/20 2044 07/12/20 0208 07/12/20 0932 07/12/20 1844  HGB 13.7 10.8* 11.4*  --   --  9.3*  --   --   HCT 41.0 32.4* 33.5*  --   --  27.8*  --   --   PLT 199 175 198  --   --  155  --   --   LABPROT 14.7  --   --   --   --   --   --   --   INR 1.2  --   --   --   --   --   --   --   HEPARINUNFRC  --   --   --    < > 0.63  --  0.73* 0.45  CREATININE 1.01* 0.82  --   --   --   --   --   --    < > = values in this interval not displayed.    Estimated Creatinine Clearance: 50.2 mL/min (by C-G formula based on SCr of 0.82 mg/dL).   Medical History: Past Medical History:  Diagnosis Date  . Acute renal failure (ARF) ([place]) 08/17/2017  . Anxiety 04/05/2012  . Bilateral sensorineural hearing loss 06/11/2014   Mild to moderate on the left side and slight to mild on the right side per audiometry 05/2014.  Hearing aides with possible masking of tinnitus recommended but patient wished to defer secondary to finances.  . Blood transfusion without reported diagnosis    pt denies  . Bursitis of right shoulder 07/12/2012   s/p shoulder injection 07/12/2012   . Cataract of right eye    REMOVED RIGHT EYE 4-19   . Constipation due to pain medication 04/27/2010  . Diverticulosis 02/08/2012   Extensive left-sided diverticula on colonoscopy March 2012 per Dr. Gala Romney   . Essential hypertension 07/20/2006  . 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 Headache 10/20/2019  . Heart murmur 1961  . Human immunodeficiency virus disease ([place]) 03/27/1986  . Hyperlipidemia LDL goal < 100 04/05/2012  . Hypokalemia 04/12/2018  . Insomnia 10/20/2019  . Long-term current use of opiate analgesic 03/17/2016  . Loose stools 04/08/2019  . Lumbar degenerative disc disease 07/20/2006   With chronic back pain   . Marijuana use 07/03/2016  . Micturition syncope 09/20/2015  . Nausea and vomiting 04/08/2019  . Opiate dependence () 04/12/2018  . 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[place]) 11/01/2011   s/p aortobifem bypass 2009   . Periumbilical hernia 10/13/5051   1 cm left periumbilical abdominal wall defect  . 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 Right rotator cuff tear 02/01/2013   Responds to periodic steroid injections  . Seborrhea 09/01/2010  . Shoulder pain, right 12/04/2017  . Small bowel obstruction due to adhesions ([place]) 02/08/2012   s/p Exploratory laparotomy,  lysis of adhesions 02/12/12    . 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 Tobacco abuse 02/19/2012  . Tobacco abuse   . Vasovagal syncope 02/15/2015  . Vitamin D deficiency 05/29/2018   Vitamin D 18.96 (04/30/2018), treated with ergocalciferol 50,000 units PO QWk X 4 weeks  . Voiding dysfunction    s/p cystoscopy and meatal dilation Dec 2005    Medications:  Infusions:  . sodium chloride    . sodium chloride Stopped (07/12/20 0752)  . heparin 850 Units/hr (07/12/20 1122)  . magnesium sulfate bolus IVPB    . sodium chloride      Assessment: 79yoF presenting with decreased pulse in L foot, concerned for lower extremity ischemia. Pt is not on anticoagulation PTA. CBC wnl. Pharmacy has been consulted to dose IV heparin. She is now s/p OR today with thrombectomy of occluded left limb of her aortobifemoral bypass and heparin was restarted this morning. Plans noted for aspirin and  plavix closer to discharge.   -heparin level is 0.45, therapeutic after decreased heparin rate to 850 units/hr.  No bleeding reported.  Goal of Therapy:  Heparin level 0.3-0.7 units/ml Monitor platelets by anticoagulation protocol: Yes   Plan:  Continue IV heparin  850 units/hr. Daily heparin level and CBC.  Nicole Cella, RPh Clinical Pharmacist  07/12/2020 8:03 PM  [facility] pharmacy phone numbers are listed on [email or web address]

## 2020-07-12 NOTE — Progress Notes (Addendum)
  Progress Note    07/12/2020 7:12 AM 2 Days Post-Op  Subjective:  Says her left foot feels better than when she came in but feels like pins and needles.    Tm 99.3  Now afebrile HR 89'Q-11'H  417'E systolic 08% RA  Vitals:   07/11/20 2254 07/12/20 0315  BP: 115/75 (!) 114/57  Pulse: 72 (!) 55  Resp: 18 13  Temp: 99 F (37.2 C) 98.3 F (36.8 C)  SpO2: 93% 96%    Physical Exam: Cardiac:  regular Lungs:  Non labored Incisions:  Left groin incision looks good without hematoma Extremities:  Easily palpable DP pulses bilaterally; left calf is soft and non tender   CBC    Component Value Date/Time   WBC 5.8 07/12/2020 0208   RBC 2.79 (L) 07/12/2020 0208   HGB 9.3 (L) 07/12/2020 0208   HGB 13.9 08/28/2018 0926   HCT 27.8 (L) 07/12/2020 0208   HCT 40.0 08/28/2018 0926   PLT 155 07/12/2020 0208   PLT 294 08/28/2018 0926   MCV 99.6 07/12/2020 0208   MCV 95 08/28/2018 0926   MCH 33.3 07/12/2020 0208   MCHC 33.5 07/12/2020 0208   RDW 13.4 07/12/2020 0208   RDW 12.7 08/28/2018 0926   LYMPHSABS 1.2 07/10/2020 2022   LYMPHSABS 4.9 (H) 03/16/2017 1006   MONOABS 0.6 07/10/2020 2022   EOSABS 0.0 07/10/2020 2022   EOSABS 0.2 03/16/2017 1006   BASOSABS 0.0 07/10/2020 2022   BASOSABS 0.1 03/16/2017 1006    BMET    Component Value Date/Time   NA 141 07/11/2020 0031   NA 142 08/28/2018 0926   K 3.1 (L) 07/11/2020 0031   CL 110 07/11/2020 0031   CO2 22 07/11/2020 0031   GLUCOSE 144 (H) 07/11/2020 0031   BUN 13 07/11/2020 0031   BUN 15 08/28/2018 0926   CREATININE 0.82 07/11/2020 0031   CREATININE 0.71 03/15/2020 0000   CALCIUM 8.8 (L) 07/11/2020 0031   GFRNONAA >60 07/11/2020 0031   GFRNONAA 82 03/15/2020 0000   GFRAA 95 03/15/2020 0000    INR    Component Value Date/Time   INR 1.2 07/10/2020 2022     Intake/Output Summary (Last 24 hours) at 07/12/2020 1448 Last data filed at 07/11/2020 1500 Gross per 24 hour  Intake 1403.85 ml  Output 200 ml  Net 1203.85  ml     Assessment:  80 y.o. female is s/p:  Redo left femoral artery exposure with thrombectomy of left limb of aortobifemoral bypass graft, profunda, and superficial femoral arteryThrombectomy of left limb of aortobifemoral bypass graft, profunda, and superficial femoral artery  2 Days Post-Op  Plan: -pt with palpable DP pulses bilaterally -left calf and anterior compartments are soft and non tender -left groin incision looks good -will get echo today given thrombectomy with unknown source for thrombus -PT/OT recommending SNF-TOC consulted for placement -DVT prophylaxis:  Heparin gtt -pt has pain contract with pain management.     Leontine Locket, PA-C [facility] [phone] 07/12/2020 7:12 AM   I agree with the above.  I have seen and evaluated the patient.  She has palpable pedal pulses and her groin incision is healing nicely.  She will get an echo to evaluate for embolic source.  If this is negative I will plan on sending her home on aspirin and Plavix.  She is awaiting SNF placement  [place] Sabine Tenenbaum

## 2020-07-12 NOTE — Progress Notes (Signed)
[place] for IV heparin Indication: lower extremity ischemia   Allergies  Allergen Reactions  . Hctz [Hydrochlorothiazide] Other (See Comments)    Dizziness, syncope; does NOT wish to take anymore    Patient Measurements: Height: 5\' 7"  (170.2 cm) Weight: 55.3 kg (122 lb) IBW/kg (Calculated) : 61.6 Heparin Dosing Weight: 55.3kg   Vital Signs: Temp: 98.2 F (36.8 C) (05/02 0911) Temp Source: Oral (05/02 0911) BP: 114/57 (05/02 0315) Pulse Rate: 55 (05/02 0315)  Labs: Recent Labs    07/10/20 2022 07/11/20 0031 07/11/20 0400 07/11/20 1417 07/11/20 2044 07/12/20 0208 07/12/20 0932  HGB 13.7 10.8* 11.4*  --   --  9.3*  --   HCT 41.0 32.4* 33.5*  --   --  27.8*  --   PLT 199 175 198  --   --  155  --   LABPROT 14.7  --   --   --   --   --   --   INR 1.2  --   --   --   --   --   --   HEPARINUNFRC  --   --   --  0.63 0.63  --  0.73*  CREATININE 1.01* 0.82  --   --   --   --   --     Estimated Creatinine Clearance: 50.2 mL/min (by C-G formula based on SCr of 0.82 mg/dL).   Medical History: Past Medical History:  Diagnosis Date  . Acute renal failure (ARF) ([place]) 08/17/2017  . Anxiety 04/05/2012  . Bilateral sensorineural hearing loss 06/11/2014   Mild to moderate on the left side and slight to mild on the right side per audiometry 05/2014.  Hearing aides with possible masking of tinnitus recommended but patient wished to defer secondary to finances.  . Blood transfusion without reported diagnosis    pt denies  . Bursitis of right shoulder 07/12/2012   s/p shoulder injection 07/12/2012   . Cataract of right eye    REMOVED RIGHT EYE 4-19   . Constipation due to pain medication 04/27/2010  . Diverticulosis 02/08/2012   Extensive left-sided diverticula on colonoscopy March 2012 per Dr. Gala Romney   . Essential hypertension 07/20/2006  . Genital herpes 07/20/2006  . 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 Headache 10/20/2019  . Heart murmur 1961  . 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 Hyperlipidemia LDL goal < 100 04/05/2012  . Hypokalemia 04/12/2018  . Insomnia 10/20/2019  . Long-term current use of opiate analgesic 03/17/2016  . Loose stools 04/08/2019  . Lumbar degenerative disc disease 07/20/2006   With chronic back pain   . Marijuana use 07/03/2016  . Micturition syncope 09/20/2015  . Nausea and vomiting 04/08/2019  . Opiate dependence ([place]) 04/12/2018  . 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[place]) 11/01/2011   s/p aortobifem bypass 2009   . Periumbilical hernia 10/12/9560   1 cm left periumbilical abdominal wall defect  . 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 Right rotator cuff tear 02/01/2013   Responds to periodic steroid injections  . Seborrhea 09/01/2010  . Shoulder pain, right 12/04/2017  . Small bowel obstruction due to adhesions ([place]) 02/08/2012   s/p Exploratory laparotomy, lysis of adhesions 02/12/12    . 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 Tobacco abuse 02/19/2012  . Tobacco abuse   . Vasovagal syncope 02/15/2015  . Vitamin D deficiency 05/29/2018  Vitamin D 18.96 (04/30/2018), treated with ergocalciferol 50,000 units PO QWk X 4 weeks  . Voiding dysfunction    s/p cystoscopy and meatal dilation Dec 2005    Medications:  Infusions:  . sodium chloride    . sodium chloride Stopped (07/12/20 0752)  . heparin Stopped (07/11/20 0554)  . magnesium sulfate bolus IVPB    . sodium chloride      Assessment: 79yoF presenting with decreased pulse in L foot, concerned for lower extremity ischemia. Pt is not on anticoagulation PTA. CBC wnl. Pharmacy has been consulted to dose IV heparin. She is now s/p OR today with thrombectomy of occluded left limb of her aortobifemoral bypass and heparin was restarted this morning. Plans noted for aspirin and plavix closer to discharge.   -heparin level= 0.73 (slightly above goal) -hg= 11.4 > 9.3, but no overt bleeding  or complications noted.  Goal of Therapy:  Heparin level 0.3-0.7 units/ml Monitor platelets by anticoagulation protocol: Yes   Plan:  Decrease IV heparin to 850 units/hr. Check heparin level in 8 hrs. Daily heparin level and CBC.  [place] Crane, Roylene Reason, BCCP Clinical Pharmacist  07/12/2020 10:46 AM   [facility] pharmacy phone numbers are listed on [email or web address]

## 2020-07-12 NOTE — Discharge Summary (Signed)
Discharge Summary     Jessica [name] 09-30-1940 80 y.o. 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 female who arrived at the [facility] following a ER to ER transfer for evaluation of ischemic left leg.  The patient began having symptoms this afternoon approximately 2 PM.  She had several episodes of falling as well as left leg pain.  She went to [facility].  There was concern over an ischemic leg.  A CT scan was attempted however transport arrived before she got her CT scan and so she was sent to [facility].  She was not started on heparin.  I was contacted and first heard about the patient when she arrived to the ER.  She was unable to move or feel her left leg which was painful.  She underwent aortobifemoral bypass graft by Dr. Kellie Simmering in 2009.  This was done for severe claudication in both legs.  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  She is a current smoker.  She takes a statin for hypercholesterolemia.  She is medically managed for hypertension  Hospital Course:  The patient was admitted to the hospital and taken to the operating room on 07/10/2020 and underwent: Redo left femoral artery exposure with thrombectomy of left limb of aortobifemoral bypass graft, profunda, and superficial femoral arteryThrombectomy of left limb of aortobifemoral bypass graft, profunda, and superficial femoral artery     Findings: Acute thrombosis of left limb of the aortobifemoral graft.  After removal of the thrombus, there was no obvious etiology for the occlusion.  No significant stenosis was noted at the origin of the profunda or superficial femoral artery.  I opened the hood of the left femoral anastomosis of the aortobifemoral graft  longitudinally.  This was closed primarily  The pt tolerated the procedure well and was transported to the PACU in good condition.   By POD 1, pt was feeling much better and motor function back to normal in left foot with near resolution of her numbness and no evidence of compartment syndrome.    POD 2, pt doing well with palpable DP pulses bilaterally.  Calf and anterior compartments are soft and non tender.  PT/OT evaluated and reocommending SNF.  2D echo ordered and no thrombus found.  She will be discharged on ASA and Plavix daily.  POD#3 ready for discharge to rehab.  CBC    Component Value Date/Time   WBC 7.3 07/13/2020 0104   RBC 2.92 (L) 07/13/2020 0104   HGB 9.7 (L) 07/13/2020 0104   HGB 13.9 08/28/2018 0926   HCT 28.3 (L) 07/13/2020 0104   HCT 40.0 08/28/2018 0926   PLT 165 07/13/2020 0104   PLT 294 08/28/2018 0926   MCV 96.9 07/13/2020 0104   MCV 95 08/28/2018 0926   MCH 33.2 07/13/2020 0104   MCHC 34.3 07/13/2020 0104   RDW 13.1 07/13/2020 0104   RDW 12.7 08/28/2018 0926   LYMPHSABS 1.2 07/10/2020 2022   LYMPHSABS 4.9 (H) 03/16/2017 1006   MONOABS 0.6 07/10/2020 2022   EOSABS 0.0 07/10/2020 2022   EOSABS 0.2 03/16/2017 1006   BASOSABS 0.0 07/10/2020 2022   BASOSABS 0.1 03/16/2017 1006    BMET    Component Value Date/Time   NA 140 07/13/2020 0104   NA  142 08/28/2018 0926   K 3.0 (L) 07/13/2020 0104   CL 110 07/13/2020 0104   CO2 24 07/13/2020 0104   GLUCOSE 86 07/13/2020 0104   BUN 5 (L) 07/13/2020 0104   BUN 15 08/28/2018 0926   CREATININE 0.53 07/13/2020 0104   CREATININE 0.71 03/15/2020 0000   CALCIUM 8.4 (L) 07/13/2020 0104   GFRNONAA >60 07/13/2020 0104   GFRNONAA 82 03/15/2020 0000   GFRAA 95 03/15/2020 0000     Discharge Instructions    Call MD for:  redness, tenderness, or signs of infection (pain, swelling, bleeding, redness, odor or green/yellow discharge around incision site)   Complete by: As directed    Call MD for:  severe or  increased pain, loss or decreased feeling  in affected limb(s)   Complete by: As directed    Call MD for:  temperature >100.5   Complete by: As directed    Resume previous diet   Complete by: As directed       Discharge Diagnosis:  Status post surgery [Z98.890] 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 Status post surgery 07/10/2020  . Headache 10/20/2019  . Insomnia 10/20/2019  . History of vitamin D deficiency 05/29/2018  . Opiate dependence ([place]) 04/12/2018  . Vasovagal syncope 01/04/2018  . Diastolic dysfunction 70/03/7492  . Marijuana use 07/03/2016  . Bilateral sensorineural hearing loss 06/11/2014  . 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 Chronic sinusitis 04/02/2013  . Right rotator cuff tear 02/01/2013  . 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 Generalized anxiety disorder 04/05/2012  . Healthcare maintenance 04/05/2012  . Hyperlipidemia 04/05/2012  . Smoker 02/19/2012  . History of small bowel obstruction 02/08/2012  . Diverticulosis of colon without hemorrhage 02/08/2012  . 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 Constipation due to pain medication 04/27/2010  . Genital herpes 07/20/2006  . 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 Essential hypertension 07/20/2006  . Lumbar degenerative disc disease 07/20/2006  . History of abnormal cervical Pap smear 07/20/2006  . Human immunodeficiency virus disease ([place]) 03/27/1986   Past Medical History:  Diagnosis Date  . Acute renal failure (ARF) ([place]) 08/17/2017  . Anxiety 04/05/2012  . Bilateral sensorineural hearing loss 06/11/2014   Mild to moderate on the left side and slight to mild on the right side per audiometry 05/2014.  Hearing aides with possible masking of tinnitus recommended but patient wished to defer secondary to  finances.  . Blood transfusion without reported diagnosis    pt denies  . Bursitis of right shoulder 07/12/2012   s/p shoulder injection 07/12/2012   . Cataract of right eye    REMOVED RIGHT EYE 4-19   . Constipation due to pain medication 04/27/2010  . Diverticulosis 02/08/2012   Extensive left-sided diverticula on colonoscopy March 2012 per Dr. Gala Romney   . Essential hypertension 07/20/2006  . Genital herpes 07/20/2006  . 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 Headache 10/20/2019  . Heart murmur 1961  . Human immunodeficiency virus disease ([place]) 03/27/1986  . Hyperlipidemia LDL goal < 100 04/05/2012  . Hypokalemia 04/12/2018  . Insomnia 10/20/2019  . Long-term current use of opiate analgesic 03/17/2016  . Loose stools 04/08/2019  . Lumbar degenerative disc disease 07/20/2006   With chronic back pain   . Marijuana use 07/03/2016  . Micturition syncope 09/20/2015  . Nausea  and vomiting 04/08/2019  . Opiate dependence ([place]) 04/12/2018  . 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 Periumbilical hernia 04/16/5359   1 cm left periumbilical abdominal wall defect  . 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 Right rotator cuff tear 02/01/2013   Responds to periodic steroid injections  . Seborrhea 09/01/2010  . Shoulder pain, right 12/04/2017  . Small bowel obstruction due to adhesions ([place]) 02/08/2012   s/p Exploratory laparotomy, lysis of adhesions 02/12/12    . 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 Tobacco abuse 02/19/2012  . Tobacco abuse   . Vasovagal syncope 02/15/2015  . Vitamin D deficiency 05/29/2018   Vitamin D 18.96 (04/30/2018), treated with ergocalciferol 50,000 units PO QWk X 4 weeks  . Voiding dysfunction    s/p cystoscopy and meatal dilation Dec 2005     Allergies as of 07/13/2020      Reactions   Hctz [hydrochlorothiazide] Other (See Comments)   Dizziness, syncope; does NOT wish to take anymore       Medication List    TAKE these medications   acetaminophen 500 MG tablet Commonly known as: TYLENOL Take 1,000 mg by mouth every 6 (six) hours as needed for moderate pain or headache.   acyclovir 400 MG tablet Commonly known as: ZOVIRAX TAKE 1 TABLET BY MOUTH THREE TIMES DAILY AS NEEDED FOR OUTBREAKS FOR SEVEN DAYS What changed:   how much to take  how to take this  when to take this  reasons to take this  additional instructions   alendronate 70 MG tablet Commonly known as: FOSAMAX Take 1 tablet (70 mg total) by mouth once a week. Take with a full glass of water on an empty stomach. What changed: when to take this   ALPRAZolam 0.5 MG tablet Commonly known as: XANAX TAKE 1 OR 2 TABLETS BY MOUTH AT BEDTIME AS NEEDED FOR ANXIETY OR SLEEP What changed: See the new instructions.   amLODipine 10 MG tablet Commonly known as: NORVASC TAKE 1 TABLET BY MOUTH DAILY   aspirin 81 MG EC tablet Take 81 mg by mouth daily.   atorvastatin 10 MG tablet Commonly known as: LIPITOR TAKE 1 TABLET BY MOUTH EVERY DAY (STOP LOVASTATIN) What changed: See the new instructions.   atropine 1 % ophthalmic solution Place 1 drop into both eyes in the morning and at bedtime.   benazepril 40 MG tablet Commonly known as: LOTENSIN Take 1 tablet (40 mg total) by mouth daily.   Biktarvy 50-200-25 MG Tabs tablet Generic drug: bictegravir-emtricitabine-tenofovir AF TAKE 1 TABLET BY MOUTH DAILY   clopidogrel 75 MG tablet Commonly known as: PLAVIX Take 1 tablet (75 mg total) by mouth daily. Start taking on: Jul 14, 2020   Combigan 0.2-0.5 % ophthalmic solution Generic drug: brimonidine-timolol Place 1 drop into the left eye 2 (two) times daily.   diclofenac Sodium 1 % Gel Commonly known as: VOLTAREN APPLY TWO GRAMS TO AFFECTED AREA(S) FOUR TIMES DAILY What changed: See the new instructions.   dorzolamide 2 % ophthalmic solution Commonly known as: TRUSOPT Place 1 drop into both eyes  2 (two) times daily.   latanoprost 0.005 % ophthalmic solution Commonly known as: XALATAN Place 1 drop into both eyes at bedtime.   multivitamin with minerals Tabs tablet Take 1 tablet by mouth daily.   Nurtec 75 MG Tbdp Generic drug: Rimegepant Sulfate Take 75 mg by  mouth every other day.   ofloxacin 0.3 % ophthalmic solution Commonly known as: OCUFLOX Place 1 drop into the right eye 4 (four) times daily.   ondansetron 4 MG disintegrating tablet Commonly known as: ZOFRAN-ODT Take 4 mg by mouth every 6 (six) hours as needed for nausea or vomiting.   prednisoLONE acetate 1 % ophthalmic suspension Commonly known as: PRED FORTE Place 1 drop into the right eye 6 (six) times daily.   Suprep Bowel Prep Kit 17.5-3.13-1.6 GM/177ML Soln Generic drug: Na Sulfate-K Sulfate-Mg Sulf Take 1 kit by mouth as directed.   terazosin 5 MG capsule Commonly known as: HYTRIN Take 1 capsule (5 mg total) by mouth at bedtime.   traMADol 50 MG tablet Commonly known as: ULTRAM TAKE TWO TABLETS BY MOUTH EVERY 8 HOURS AS NEEDED FOR SEVERE PAIN What changed: See the new instructions.   triamcinolone ointment 0.1 % Commonly known as: KENALOG APPLY TO THE AFFECTED AREA(S) TWICE DAILY What changed:   how much to take  when to take this  reasons to take this  additional instructions       Discharge Instructions: [facility] Discharge instructions Lower Extremity Bypass Surgery  Please refer to the following instruction for your post-procedure care. Your surgeon or physician assistant will discuss any changes with you.  Activity  You are encouraged to walk as much as you can. You can slowly return to normal activities during the month after your surgery. Avoid strenuous activity and heavy lifting until your doctor tells you it's OK. Avoid activities such as vacuuming or swinging a golf club. Do not drive until your doctor give the OK and you are no longer  taking prescription pain medications. It is also normal to have difficulty with sleep habits, eating and bowel movement after surgery. These will go away with time.  Bathing/Showering  You may shower after you go home. Do not soak in a bathtub, hot tub, or swim until the incision heals completely.  Incision Care  Clean your incision with mild soap and water. Shower every day. Pat the area dry with a clean towel. You do not need a bandage unless otherwise instructed. Do not apply any ointments or creams to your incision. If you have open wounds you will be instructed how to care for them or a visiting nurse may be arranged for you. If you have staples or sutures along your incision they will be removed at your post-op appointment. You may have skin glue on your incision. Do not peel it off. It will come off on its own in about one week.  Wash the groin wound with soap and water daily and pat dry. (No tub bath-only shower)  Then put a dry gauze or washcloth in the groin to keep this area dry to help prevent wound infection.  Do this daily and as needed.  Do not use Vaseline or neosporin on your incisions.  Only use soap and water on your incisions and then protect and keep dry.  Diet  Resume your normal diet. There are no special food restrictions following this procedure. A low fat/ low cholesterol diet is recommended for all patients with vascular disease. In order to heal from your surgery, it is CRITICAL to get adequate nutrition. Your body requires vitamins, minerals, and protein. Vegetables are the best source of vitamins and minerals. Vegetables also provide the perfect balance of protein. Processed food has little nutritional value, so try to avoid this.  Medications  Resume taking all your medications unless your doctor or Physician Assistant tells you not to. If your incision is causing pain, you may take over-the-counter pain relievers such as acetaminophen (Tylenol). If you were  prescribed a stronger pain medication, please aware these medication can cause nausea and constipation. Prevent nausea by taking the medication with a snack or meal. Avoid constipation by drinking plenty of fluids and eating foods with high amount of fiber, such as fruits, vegetables, and grains. Take Colace 100 mg (an over-the-counter stool softener) twice a day as needed for constipation.  Do not take Tylenol if you are taking prescription pain medications.  Follow Up  Our office will schedule a follow up appointment 2-3 weeks following discharge.  Please call us immediately for any of the following conditions  .Severe or worsening pain in your legs or feet while at rest or while walking .Increase pain, redness, warmth, or drainage (pus) from your incision site(s) . Fever of 101 degree or higher . The swelling in your leg with the bypass suddenly worsens and becomes more painful than when you were in the hospital . If you have been instructed to feel your graft pulse then you should do so every day. If you can no longer feel this pulse, call the office immediately. Not all patients are given this instruction. .  Leg swelling is common after leg bypass surgery.  The swelling should improve over a few months following surgery. To improve the swelling, you may elevate your legs above the level of your heart while you are sitting or resting. Your surgeon or physician assistant may ask you to apply an ACE wrap or wear compression (TED) stockings to help to reduce swelling.  Reduce your risk of vascular disease  Stop smoking. If you would like help call [facility] at 1-800-QUIT-NOW [phone]) or [place] at [phone].  . Manage your cholesterol . Maintain a desired weight . Control your diabetes weight . Control your diabetes . Keep your blood pressure down .  If you have any questions, please call the office at [phone]     Disposition: SNF  Patient's condition: is  Good  Follow up: 1. [facility] in 3-4 weeks   Leontine Locket, PA-C [facility] [phone] 07/13/2020  3:16 PM  - For VQI Registry use ---   Post-op:  Wound infection: No  Graft infection: No  Transfusion: No    If yes, n/a units given New Arrhythmia: No Ipsilateral amputation: No, '[ ]'  Minor, '[ ]'  BKA, '[ ]'  AKA Discharge patency: '[ ]'  Primary, [x ] Primary assisted, '[ ]'  Secondary, '[ ]'  Occluded Patency judged by: '[ ]'  Dopper only, '[ ]'  Palpable graft pulse, '[x]'  Palpable distal pulse, '[ ]'  ABI inc. > 0.15, '[ ]'  Duplex Discharge ABI: R not done, L  D/C Ambulatory Status: Ambulatory with Assistance  Complications: MI: No, '[ ]'  Troponin only, '[ ]'  EKG or Clinical CHF: No Resp failure:No, '[ ]'  Pneumonia, '[ ]'  Ventilator Chg in renal function: No, '[ ]'  Inc. Cr > 0.5, '[ ]'  Temp. Dialysis,  '[ ]'  Permanent dialysis Stroke: No, '[ ]'  Minor, '[ ]'  Major Return to OR: No  Reason for return to OR: '[ ]'  Bleeding, '[ ]'  Infection, '[ ]'  Thrombosis, '[ ]'  Revision  Discharge medications: Statin use:  yes ASA use:  yes Plavix use:  yes Beta blocker use: no CCB use:  Yes ACEI use:   yes ARB use:  no Coumadin use: no

## 2020-07-13 ENCOUNTER — Inpatient Hospital Stay (HOSPITAL_COMMUNITY): Payer: Medicare Other

## 2020-07-13 DIAGNOSIS — M810 Age-related osteoporosis without current pathological fracture: Secondary | ICD-10-CM | POA: Diagnosis not present

## 2020-07-13 DIAGNOSIS — M62262 Nontraumatic ischemic infarction of muscle, left lower leg: Secondary | ICD-10-CM | POA: Diagnosis not present

## 2020-07-13 DIAGNOSIS — I743 Embolism and thrombosis of arteries of the lower extremities: Secondary | ICD-10-CM | POA: Diagnosis not present

## 2020-07-13 DIAGNOSIS — I351 Nonrheumatic aortic (valve) insufficiency: Secondary | ICD-10-CM

## 2020-07-13 DIAGNOSIS — H903 Sensorineural hearing loss, bilateral: Secondary | ICD-10-CM | POA: Diagnosis not present

## 2020-07-13 DIAGNOSIS — R5381 Other malaise: Secondary | ICD-10-CM | POA: Diagnosis not present

## 2020-07-13 DIAGNOSIS — R262 Difficulty in walking, not elsewhere classified: Secondary | ICD-10-CM | POA: Diagnosis not present

## 2020-07-13 DIAGNOSIS — J329 Chronic sinusitis, unspecified: Secondary | ICD-10-CM | POA: Diagnosis not present

## 2020-07-13 DIAGNOSIS — M2550 Pain in unspecified joint: Secondary | ICD-10-CM | POA: Diagnosis not present

## 2020-07-13 DIAGNOSIS — I10 Essential (primary) hypertension: Secondary | ICD-10-CM | POA: Diagnosis not present

## 2020-07-13 DIAGNOSIS — M1990 Unspecified osteoarthritis, unspecified site: Secondary | ICD-10-CM | POA: Diagnosis not present

## 2020-07-13 DIAGNOSIS — R278 Other lack of coordination: Secondary | ICD-10-CM | POA: Diagnosis not present

## 2020-07-13 DIAGNOSIS — I340 Nonrheumatic mitral (valve) insufficiency: Secondary | ICD-10-CM | POA: Diagnosis not present

## 2020-07-13 DIAGNOSIS — Z7401 Bed confinement status: Secondary | ICD-10-CM | POA: Diagnosis not present

## 2020-07-13 DIAGNOSIS — M6281 Muscle weakness (generalized): Secondary | ICD-10-CM | POA: Diagnosis not present

## 2020-07-13 DIAGNOSIS — H409 Unspecified glaucoma: Secondary | ICD-10-CM | POA: Diagnosis not present

## 2020-07-13 DIAGNOSIS — T82898D Other specified complication of vascular prosthetic devices, implants and grafts, subsequent encounter: Secondary | ICD-10-CM | POA: Diagnosis not present

## 2020-07-13 DIAGNOSIS — I739 Peripheral vascular disease, unspecified: Secondary | ICD-10-CM | POA: Diagnosis not present

## 2020-07-13 DIAGNOSIS — Z743 Need for continuous supervision: Secondary | ICD-10-CM | POA: Diagnosis not present

## 2020-07-13 DIAGNOSIS — I5189 Other ill-defined heart diseases: Secondary | ICD-10-CM | POA: Diagnosis not present

## 2020-07-13 DIAGNOSIS — R1032 Left lower quadrant pain: Secondary | ICD-10-CM | POA: Diagnosis not present

## 2020-07-13 DIAGNOSIS — E785 Hyperlipidemia, unspecified: Secondary | ICD-10-CM | POA: Diagnosis not present

## 2020-07-13 DIAGNOSIS — Z79899 Other long term (current) drug therapy: Secondary | ICD-10-CM | POA: Diagnosis not present

## 2020-07-13 LAB — CBC
HCT: 28.3 % — ABNORMAL LOW (ref 36.0–46.0)
Hemoglobin: 9.7 g/dL — ABNORMAL LOW (ref 12.0–15.0)
MCH: 33.2 pg (ref 26.0–34.0)
MCHC: 34.3 g/dL (ref 30.0–36.0)
MCV: 96.9 fL (ref 80.0–100.0)
Platelets: 165 10*3/uL (ref 150–400)
RBC: 2.92 MIL/uL — ABNORMAL LOW (ref 3.87–5.11)
RDW: 13.1 % (ref 11.5–15.5)
WBC: 7.3 10*3/uL (ref 4.0–10.5)
nRBC: 0 % (ref 0.0–0.2)

## 2020-07-13 LAB — ECHOCARDIOGRAM COMPLETE
AR max vel: 4.44 cm2
AV Area VTI: 4.47 cm2
AV Area mean vel: 4.35 cm2
AV Mean grad: 9 mmHg
AV Peak grad: 17.1 mmHg
Ao pk vel: 2.07 m/s
Area-P 1/2: 2.42 cm2
Calc EF: 52.2 %
Height: 67 in
MV M vel: 5.13 m/s
MV Peak grad: 105.3 mmHg
MV VTI: 4.34 cm2
P 1/2 time: 830 msec
Radius: 0.4 cm
S' Lateral: 1.8 cm
Single Plane A2C EF: 52.4 %
Single Plane A4C EF: 50.3 %
Weight: 1952 oz

## 2020-07-13 LAB — BASIC METABOLIC PANEL
Anion gap: 6 (ref 5–15)
BUN: 5 mg/dL — ABNORMAL LOW (ref 8–23)
CO2: 24 mmol/L (ref 22–32)
Calcium: 8.4 mg/dL — ABNORMAL LOW (ref 8.9–10.3)
Chloride: 110 mmol/L (ref 98–111)
Creatinine, Ser: 0.53 mg/dL (ref 0.44–1.00)
GFR, Estimated: >60 mL/min (ref >60)
Glucose, Bld: 86 mg/dL (ref 70–99)
Potassium: 3 mmol/L — ABNORMAL LOW (ref 3.5–5.1)
Sodium: 140 mmol/L (ref 135–145)

## 2020-07-13 LAB — HEPARIN LEVEL (UNFRACTIONATED): Heparin Unfractionated: 0.46 IU/mL (ref 0.30–0.70)

## 2020-07-13 LAB — SURGICAL PATHOLOGY

## 2020-07-13 MED ORDER — POTASSIUM CHLORIDE CRYS ER 20 MEQ PO TBCR
40.0000 meq | EXTENDED_RELEASE_TABLET | Freq: Four times a day (QID) | ORAL | Status: DC
  Administered 2020-07-13 (×2): 40 meq via ORAL
  Filled 2020-07-13 (×2): qty 2

## 2020-07-13 MED ORDER — CLOPIDOGREL BISULFATE 75 MG PO TABS
75.0000 mg | ORAL_TABLET | Freq: Every day | ORAL | Status: DC
  Administered 2020-07-13: 75 mg via ORAL
  Filled 2020-07-13: qty 1

## 2020-07-13 MED ORDER — CLOPIDOGREL BISULFATE 75 MG PO TABS: 75.0000 mg | ORAL_TABLET | Freq: Every day | ORAL | 3 refills | Status: DC

## 2020-07-13 NOTE — Progress Notes (Signed)
*  PRELIMINARY RESULTS* Echocardiogram 2D Echocardiogram has been performed.  Luisa Hart RDCS 07/13/2020, 8:50 AM

## 2020-07-13 NOTE — Progress Notes (Signed)
Physical Therapy Treatment Patient Details Name: Jessica [name] MRN: [id number] DOB: [birth year] Today's Date: 07/13/2020    History of Present Illness Jessica [name] is a 80 y.o. 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 Found to have ischemic left leg s/p thrombectomy of left limb of aortobifemoral bypass graft, profunda, and superficial femoral artery    PT Comments    Pt received in supine, agreeable to therapy session and with good participation and tolerance for transfer and gait training with multimodal cues due to cognition/blindness. Pt requires increased time and up to minA for transfers and gait for short household distances, distance limited due to LLE pain and pt fatigue. VSS on RA. Pt continues to benefit from PT services to progress toward functional mobility goals. Continue to recommend SNF.   Follow Up Recommendations  Supervision/Assistance - 24 hour;SNF     Equipment Recommendations  None recommended by PT    Recommendations for Other Services       Precautions / Restrictions Precautions Precautions: Fall Precaution Comments: blind Restrictions Weight Bearing Restrictions: No    Mobility  Bed Mobility Overal bed mobility: Needs Assistance Bed Mobility: Supine to Sit     Supine to sit: Min guard;HOB elevated     General bed mobility comments: needs hand over hand assist to locate bed rail for self-assist, unable to sit upright without railing assist (attempted); increased time to perform    Transfers Overall transfer level: Needs assistance Equipment used: Rolling walker (2 wheeled) Transfers: Sit to/from Stand Sit to Stand: Min assist;Min guard         General transfer comment: minA from EOB to RW and min guard from toilet height to RW  using wall railing  Ambulation/Gait Ambulation/Gait assistance: Min assist Gait Distance (Feet): 60 Feet (62ft to toilet, then 63ft in hallway/room) Assistive device: Rolling walker (2 wheeled) Gait Pattern/deviations: Step-to pattern;Step-through pattern;Decreased step length - right;Decreased weight shift to left;Trunk flexed Gait velocity: variable; grossly <0.2 m/s Gait velocity interpretation: <1.8 ft/sec, indicate of risk for recurrent falls General Gait Details: pt needs cues for upright posture and positioning within RW, she tends to hold RW too far ahead to help much with offloading LLE so needs multi-modal cues, increased time and manual assist to manage device; also frequent cues for directional navigation 2/2 blindness   Stairs             Wheelchair Mobility    Modified Rankin (Stroke Patients Only)       Balance Overall balance assessment: Needs assistance Sitting-balance support: No upper extremity supported;Feet supported Sitting balance-Leahy Scale: Good     Standing balance support: Bilateral upper extremity supported Standing balance-Leahy Scale: Poor Standing balance comment: UE support and min A to min guard for balance                            Cognition Arousal/Alertness: Awake/alert Behavior During Therapy: WFL for tasks assessed/performed Overall Cognitive Status: History of cognitive impairments - at baseline                                 General Comments: slight impaired memory but good following of 1-step commands  Exercises      General Comments General comments (skin integrity, edema, etc.): BP 125/66 (83) supine; BP 132/64 (84) seated EOB; HR 67-80 bpm; SpO2 98% on RA      Pertinent Vitals/Pain Pain Assessment: 0-10 Pain Score: 6  Pain Location: LLE Pain Descriptors / Indicators: Operative site guarding;Grimacing Pain Intervention(s): Monitored during session;Premedicated before  session;Repositioned    Home Living                      Prior Function            PT Goals (current goals can now be found in the care plan section) Acute Rehab PT Goals Patient Stated Goal: to go to rehab then home PT Goal Formulation: With patient Time For Goal Achievement: 07/25/20 Potential to Achieve Goals: Good Progress towards PT goals: Progressing toward goals    Frequency    Min 3X/week      PT Plan Current plan remains appropriate    Co-evaluation              AM-PAC PT "6 Clicks" Mobility   Outcome Measure  Help needed turning from your back to your side while in a flat bed without using bedrails?: A Little Help needed moving from lying on your back to sitting on the side of a flat bed without using bedrails?: A Little Help needed moving to and from a bed to a chair (including a wheelchair)?: A Little Help needed standing up from a chair using your arms (e.g., wheelchair or bedside chair)?: A Little Help needed to walk in hospital room?: A Little Help needed climbing 3-5 steps with a railing? : A Lot 6 Click Score: 17    End of Session Equipment Utilized During Treatment: Gait belt Activity Tolerance: Patient limited by pain;Patient tolerated treatment well Patient left: in bed;with call bell/phone within reach Nurse Communication: Mobility status;Other (comment) (pt reporting she hasn't had a BM in a few days) PT Visit Diagnosis: Other abnormalities of gait and mobility (R26.89);Pain Pain - Right/Left: Left Pain - part of body: Hip     Time: 1235-1316 PT Time Calculation (min) (ACUTE ONLY): 41 min  Charges:  $Gait Training: 23-37 mins $Therapeutic Activity: 8-22 mins                     Manford Sprong P., PTA Acute Rehabilitation Services Pager: [phone] Office: [place] 07/13/2020, 1:48 PM

## 2020-07-13 NOTE — Progress Notes (Addendum)
Called report to [place], RN at [facility]. D/c tele and IVs. Sent pt to SNF with 5 eye bottle and d/c paperwork  Lavenia Atlas, RN

## 2020-07-13 NOTE — Discharge Instructions (Signed)
Dry dressing as needed for left groin.  May shower as needed.

## 2020-07-13 NOTE — Progress Notes (Signed)
[place] for IV heparin Indication: lower extremity ischemia   Allergies  Allergen Reactions  . Hctz [Hydrochlorothiazide] Other (See Comments)    Dizziness, syncope; does NOT wish to take anymore    Patient Measurements: Height: 5\' 7"  (170.2 cm) Weight: 55.3 kg (122 lb) IBW/kg (Calculated) : 61.6 Heparin Dosing Weight: 55.3kg   Vital Signs: Temp: 99.7 F (37.6 C) (05/03 0745) Temp Source: Oral (05/03 0745) BP: 158/67 (05/03 0745) Pulse Rate: 65 (05/03 0745)  Labs: Recent Labs    07/10/20 2022 07/11/20 0031 07/11/20 0400 07/11/20 1417 07/12/20 0208 07/12/20 0932 07/12/20 1844 07/13/20 0104  HGB 13.7 10.8* 11.4*  --  9.3*  --   --  9.7*  HCT 41.0 32.4* 33.5*  --  27.8*  --   --  28.3*  PLT 199 175 198  --  155  --   --  165  LABPROT 14.7  --   --   --   --   --   --   --   INR 1.2  --   --   --   --   --   --   --   HEPARINUNFRC  --   --   --    < >  --  0.73* 0.45 0.46  CREATININE 1.01* 0.82  --   --   --   --   --  0.53   < > = values in this interval not displayed.    Estimated Creatinine Clearance: 51.4 mL/min (by C-G formula based on SCr of 0.53 mg/dL).   Medical History: Past Medical History:  Diagnosis Date  . Acute renal failure (ARF) ([place]) 08/17/2017  . Anxiety 04/05/2012  . Bilateral sensorineural hearing loss 06/11/2014   Mild to moderate on the left side and slight to mild on the right side per audiometry 05/2014.  Hearing aides with possible masking of tinnitus recommended but patient wished to defer secondary to finances.  . Blood transfusion without reported diagnosis    pt denies  . Bursitis of right shoulder 07/12/2012   s/p shoulder injection 07/12/2012   . Cataract of right eye    REMOVED RIGHT EYE 4-19   . Constipation due to pain medication 04/27/2010  . Diverticulosis 02/08/2012   Extensive left-sided diverticula on colonoscopy March 2012 per Dr. Gala Romney   . Essential hypertension 07/20/2006  . 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 Headache 10/20/2019  . Heart murmur 1961  . Human immunodeficiency virus disease ([place]) 03/27/1986  . Hyperlipidemia LDL goal < 100 04/05/2012  . Hypokalemia 04/12/2018  . Insomnia 10/20/2019  . Long-term current use of opiate analgesic 03/17/2016  . Loose stools 04/08/2019  . Lumbar degenerative disc disease 07/20/2006   With chronic back pain   . Marijuana use 07/03/2016  . Micturition syncope 09/20/2015  . Nausea and vomiting 04/08/2019  . Opiate dependence ([place]) 04/12/2018  . 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[place]) 11/01/2011   s/p aortobifem bypass 2009   . Periumbilical hernia 09/15/6431   1 cm left periumbilical abdominal wall defect  . 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 Right rotator cuff tear 02/01/2013   Responds to periodic steroid injections  . Seborrhea 09/01/2010  . Shoulder pain, right 12/04/2017  . Small bowel obstruction due to adhesions ([place]) 02/08/2012   s/p Exploratory laparotomy, lysis of adhesions 02/12/12    .  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 Tobacco abuse 02/19/2012  . Tobacco abuse   . Vasovagal syncope 02/15/2015  . Vitamin D deficiency 05/29/2018   Vitamin D 18.96 (04/30/2018), treated with ergocalciferol 50,000 units PO QWk X 4 weeks  . Voiding dysfunction    s/p cystoscopy and meatal dilation Dec 2005    Medications:  Infusions:  . sodium chloride    . sodium chloride Stopped (07/12/20 0752)  . heparin 850 Units/hr (07/13/20 0720)  . magnesium sulfate bolus IVPB    . sodium chloride      Assessment: 79yoF presenting with decreased pulse in L foot, concerned for lower extremity ischemia. Pt is not on anticoagulation PTA. CBC wnl. Pharmacy has been consulted to dose IV heparin. She is now s/p OR today with thrombectomy of occluded left limb of her aortobifemoral bypass and heparin was restarted this morning. Plans noted for aspirin and  plavix closer to discharge.   -heparin level= 0.46 - within goal range -hg= 11.4 > 9.7, but no overt bleeding or complications noted.  Pltc stable  Goal of Therapy:  Heparin level 0.3-0.7 units/ml Monitor platelets by anticoagulation protocol: Yes   Plan:  Continue IV heparin at 850 units/hr. Daily heparin level and CBC. F/u plans for oral anticoagulation soon?  Pending ECHO results.  [place] Crane, Roylene Reason, BCCP Clinical Pharmacist  07/13/2020 7:52 AM   [facility] pharmacy phone numbers are listed on [email or web address]

## 2020-07-13 NOTE — TOC Progression Note (Signed)
Transition of Care [facility]) - Progression Note    Patient Details  Name: Jessica [name] MRN: [id number] Date of Birth: 06/19/1940  Transition of Care [facility]) CM/SW [place], [place] Phone Number: 07/13/2020, 2:03 PM  Clinical Narrative:     Received insurance authorization [id number]   From 05/3-05/05  Thurmond Butts, MSW, LCSW Clinical Social Worker   Expected Discharge Plan: Skilled Nursing Facility Barriers to Discharge: [place] Work up,SNF Pending bed offer  Expected Discharge Plan and Services Expected Discharge Plan: [place] arrangements for the past 2 months: Single Family Home                                       Social Determinants of Health (SDOH) Interventions    Readmission Risk Interventions No flowsheet data found.

## 2020-07-13 NOTE — Progress Notes (Addendum)
[facility] [place]  Subjective  - She is doing well over all.  Still having pins and needle feeling in the left foot.   Objective (!) 142/63 60 97.9 F (36.6 C) (Oral) 17 98% No intake or output data in the 24 hours ending 07/13/20 0716  Palpable DP with DP Signal on the right B LE motor intact Left groin soft without hematoma Lungs non labored breathing Cardiac RRR     Assessment/Planning:  80 y.o. female is s/p:  Redo left femoral artery exposure with thrombectomy of left limb of aortobifemoral bypass graft, profunda, and superficial femoral arteryThrombectomy of left limb of aortobifemoral bypass graft, profunda, and superficial femoral artery  3 Days Post-Op  Good inflow with palpable left DP Pending Echo results from 07/12/20 Plan for discharge to SNF Plan for discharge on ASA and Plavix possible anticoagulation if Echo reports clot.  Roxy Horseman 07/13/2020 7:16 AM --  Laboratory Lab Results: Recent Labs    07/12/20 0208 07/13/20 0104  WBC 5.8 7.3  HGB 9.3* 9.7*  HCT 27.8* 28.3*  PLT 155 165   BMET Recent Labs    07/11/20 0031 07/13/20 0104  NA 141 140  K 3.1* 3.0*  CL 110 110  CO2 22 24  GLUCOSE 144* 86  BUN 13 5*  CREATININE 0.82 0.53  CALCIUM 8.8* 8.4*    COAG Lab Results  Component Value Date   INR 1.2 07/10/2020   INR 0.97 02/08/2012   INR 1.1 05/08/2007   No results found for: PTT

## 2020-07-13 NOTE — TOC Transition Note (Signed)
Transition of Care [facility]) - CM/SW Discharge Note   Patient Details  Name: Jessica [name] MRN: [id number] Date of Birth: [birth year]  Transition of Care [facility]) CM/SW Contact:  Vinie Sill, LCSW Phone Number: 07/13/2020, 4:01 PM   Clinical Narrative:     Patient will Discharge to: [facility] Discharge Date: 07/13/2020 Family Notified: [name],daughter Transport [id number]  Per MD patient is ready for discharge. RN, patient, and facility notified of discharge. Discharge Summary sent to facility. RN given number for [phone], room 303-2. Ambulance transport requested for patient.   Clinical Social Worker signing off.  Thurmond Butts, MSW, LCSW Clinical Social Worker    Final next level of care: Skilled Nursing Facility Barriers to Discharge: Barriers Resolved   Patient Goals and CMS Choice Patient states their goals for this hospitalization and ongoing recovery are:: To be able to go home CMS Medicare.gov Compare Post Acute Care list provided to:: Patient Choice offered to / list presented to : Patient  Discharge Placement              Patient chooses bed at: [facility] Patient to be transferred to facility by: [place] Name of family member notified: [name],daughter Patient and family notified of of transfer: 07/13/20  Discharge Plan and Services                                     Social Determinants of Health (SDOH) Interventions     Readmission Risk Interventions No flowsheet data found.

## 2020-07-14 DIAGNOSIS — I743 Embolism and thrombosis of arteries of the lower extremities: Secondary | ICD-10-CM | POA: Diagnosis not present

## 2020-07-14 DIAGNOSIS — I10 Essential (primary) hypertension: Secondary | ICD-10-CM | POA: Diagnosis not present

## 2020-07-14 DIAGNOSIS — E785 Hyperlipidemia, unspecified: Secondary | ICD-10-CM | POA: Diagnosis not present

## 2020-07-14 DIAGNOSIS — Z79899 Other long term (current) drug therapy: Secondary | ICD-10-CM | POA: Diagnosis not present

## 2020-07-15 DIAGNOSIS — I10 Essential (primary) hypertension: Secondary | ICD-10-CM | POA: Diagnosis not present

## 2020-07-15 DIAGNOSIS — M1990 Unspecified osteoarthritis, unspecified site: Secondary | ICD-10-CM | POA: Diagnosis not present

## 2020-07-15 DIAGNOSIS — M810 Age-related osteoporosis without current pathological fracture: Secondary | ICD-10-CM | POA: Diagnosis not present

## 2020-07-15 DIAGNOSIS — H409 Unspecified glaucoma: Secondary | ICD-10-CM | POA: Diagnosis not present

## 2020-07-15 DIAGNOSIS — E785 Hyperlipidemia, unspecified: Secondary | ICD-10-CM | POA: Diagnosis not present

## 2020-07-15 DIAGNOSIS — T82898D Other specified complication of vascular prosthetic devices, implants and grafts, subsequent encounter: Secondary | ICD-10-CM | POA: Diagnosis not present

## 2020-07-19 ENCOUNTER — Encounter: Payer: Medicare Other | Admitting: Gastroenterology

## 2020-07-19 ENCOUNTER — Telehealth: Payer: Self-pay

## 2020-07-19 DIAGNOSIS — T82898D Other specified complication of vascular prosthetic devices, implants and grafts, subsequent encounter: Secondary | ICD-10-CM | POA: Diagnosis not present

## 2020-07-19 DIAGNOSIS — R1032 Left lower quadrant pain: Secondary | ICD-10-CM | POA: Diagnosis not present

## 2020-07-19 NOTE — Telephone Encounter (Signed)
[facility] Dance movement psychotherapist ) call to request a Nursing home discharge f/u appt was given  5/25/@3 :58

## 2020-07-21 ENCOUNTER — Telehealth: Payer: Self-pay | Admitting: *Deleted

## 2020-07-21 DIAGNOSIS — I739 Peripheral vascular disease, unspecified: Secondary | ICD-10-CM | POA: Diagnosis not present

## 2020-07-21 DIAGNOSIS — E785 Hyperlipidemia, unspecified: Secondary | ICD-10-CM | POA: Diagnosis not present

## 2020-07-21 DIAGNOSIS — R55 Syncope and collapse: Secondary | ICD-10-CM | POA: Diagnosis not present

## 2020-07-21 DIAGNOSIS — I119 Hypertensive heart disease without heart failure: Secondary | ICD-10-CM | POA: Diagnosis not present

## 2020-07-21 DIAGNOSIS — H409 Unspecified glaucoma: Secondary | ICD-10-CM | POA: Diagnosis not present

## 2020-07-21 DIAGNOSIS — M810 Age-related osteoporosis without current pathological fracture: Secondary | ICD-10-CM | POA: Diagnosis not present

## 2020-07-21 DIAGNOSIS — M1990 Unspecified osteoarthritis, unspecified site: Secondary | ICD-10-CM | POA: Diagnosis not present

## 2020-07-21 DIAGNOSIS — M6281 Muscle weakness (generalized): Secondary | ICD-10-CM | POA: Diagnosis not present

## 2020-07-21 DIAGNOSIS — H903 Sensorineural hearing loss, bilateral: Secondary | ICD-10-CM | POA: Diagnosis not present

## 2020-07-21 DIAGNOSIS — Z48812 Encounter for surgical aftercare following surgery on the circulatory system: Secondary | ICD-10-CM | POA: Diagnosis not present

## 2020-07-21 DIAGNOSIS — T82868D Thrombosis of vascular prosthetic devices, implants and grafts, subsequent encounter: Secondary | ICD-10-CM | POA: Diagnosis not present

## 2020-07-21 NOTE — Telephone Encounter (Signed)
Magda Paganini PT with [facility] called requesting VO for [facility] PT 2 week 3 to work on strength, balance, transfers, and gait. Also, requesting OT Eval. Verbal auth given. Will route to PCP for agreement/denial.

## 2020-07-23 DIAGNOSIS — T82868D Thrombosis of vascular prosthetic devices, implants and grafts, subsequent encounter: Secondary | ICD-10-CM | POA: Diagnosis not present

## 2020-07-23 DIAGNOSIS — E785 Hyperlipidemia, unspecified: Secondary | ICD-10-CM | POA: Diagnosis not present

## 2020-07-23 DIAGNOSIS — R55 Syncope and collapse: Secondary | ICD-10-CM | POA: Diagnosis not present

## 2020-07-23 DIAGNOSIS — M810 Age-related osteoporosis without current pathological fracture: Secondary | ICD-10-CM | POA: Diagnosis not present

## 2020-07-23 DIAGNOSIS — Z48812 Encounter for surgical aftercare following surgery on the circulatory system: Secondary | ICD-10-CM | POA: Diagnosis not present

## 2020-07-23 DIAGNOSIS — I119 Hypertensive heart disease without heart failure: Secondary | ICD-10-CM | POA: Diagnosis not present

## 2020-07-23 DIAGNOSIS — H409 Unspecified glaucoma: Secondary | ICD-10-CM | POA: Diagnosis not present

## 2020-07-23 DIAGNOSIS — H903 Sensorineural hearing loss, bilateral: Secondary | ICD-10-CM | POA: Diagnosis not present

## 2020-07-23 DIAGNOSIS — I739 Peripheral vascular disease, unspecified: Secondary | ICD-10-CM | POA: Diagnosis not present

## 2020-07-23 DIAGNOSIS — M1990 Unspecified osteoarthritis, unspecified site: Secondary | ICD-10-CM | POA: Diagnosis not present

## 2020-07-23 DIAGNOSIS — M6281 Muscle weakness (generalized): Secondary | ICD-10-CM | POA: Diagnosis not present

## 2020-07-23 NOTE — Telephone Encounter (Signed)
Called Jessica [name] - no answer.  Called Jessica [name]'s daughter,Timpt, who was able to answer some questions. She stated Jessica [name] is doing well. She was discharged from the [facility] on Tuesday where she had been receiving physical therapy. Now, Jessica [name] is receiving home [facility] [name] from [facility]. Daughter stated Jessica [name] does not any other assistance at this time; [name]'s brother and aunt are staying with the Jessica [name]. 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 Reminded daughter of Jessica [name]'s appt on May 25, she's aware and stated she will be bring Jessica [name] to the appt to see Dr Court Joy @ [phone]. Informed to call the office for any concerns/ problems.

## 2020-07-26 DIAGNOSIS — M810 Age-related osteoporosis without current pathological fracture: Secondary | ICD-10-CM | POA: Diagnosis not present

## 2020-07-26 DIAGNOSIS — H409 Unspecified glaucoma: Secondary | ICD-10-CM | POA: Diagnosis not present

## 2020-07-26 DIAGNOSIS — I739 Peripheral vascular disease, unspecified: Secondary | ICD-10-CM | POA: Diagnosis not present

## 2020-07-26 DIAGNOSIS — R55 Syncope and collapse: Secondary | ICD-10-CM | POA: Diagnosis not present

## 2020-07-26 DIAGNOSIS — T82868D Thrombosis of vascular prosthetic devices, implants and grafts, subsequent encounter: Secondary | ICD-10-CM | POA: Diagnosis not present

## 2020-07-26 DIAGNOSIS — I119 Hypertensive heart disease without heart failure: Secondary | ICD-10-CM | POA: Diagnosis not present

## 2020-07-26 DIAGNOSIS — H903 Sensorineural hearing loss, bilateral: Secondary | ICD-10-CM | POA: Diagnosis not present

## 2020-07-26 DIAGNOSIS — Z48812 Encounter for surgical aftercare following surgery on the circulatory system: Secondary | ICD-10-CM | POA: Diagnosis not present

## 2020-07-26 DIAGNOSIS — M6281 Muscle weakness (generalized): Secondary | ICD-10-CM | POA: Diagnosis not present

## 2020-07-26 DIAGNOSIS — M1990 Unspecified osteoarthritis, unspecified site: Secondary | ICD-10-CM | POA: Diagnosis not present

## 2020-07-26 DIAGNOSIS — E785 Hyperlipidemia, unspecified: Secondary | ICD-10-CM | POA: Diagnosis not present

## 2020-07-28 DIAGNOSIS — R55 Syncope and collapse: Secondary | ICD-10-CM | POA: Diagnosis not present

## 2020-07-28 DIAGNOSIS — I739 Peripheral vascular disease, unspecified: Secondary | ICD-10-CM | POA: Diagnosis not present

## 2020-07-28 DIAGNOSIS — M810 Age-related osteoporosis without current pathological fracture: Secondary | ICD-10-CM | POA: Diagnosis not present

## 2020-07-28 DIAGNOSIS — I119 Hypertensive heart disease without heart failure: Secondary | ICD-10-CM | POA: Diagnosis not present

## 2020-07-28 DIAGNOSIS — Z48812 Encounter for surgical aftercare following surgery on the circulatory system: Secondary | ICD-10-CM | POA: Diagnosis not present

## 2020-07-28 DIAGNOSIS — H409 Unspecified glaucoma: Secondary | ICD-10-CM | POA: Diagnosis not present

## 2020-07-28 DIAGNOSIS — H903 Sensorineural hearing loss, bilateral: Secondary | ICD-10-CM | POA: Diagnosis not present

## 2020-07-28 DIAGNOSIS — T82868D Thrombosis of vascular prosthetic devices, implants and grafts, subsequent encounter: Secondary | ICD-10-CM | POA: Diagnosis not present

## 2020-07-28 DIAGNOSIS — E785 Hyperlipidemia, unspecified: Secondary | ICD-10-CM | POA: Diagnosis not present

## 2020-07-28 DIAGNOSIS — M1990 Unspecified osteoarthritis, unspecified site: Secondary | ICD-10-CM | POA: Diagnosis not present

## 2020-07-28 DIAGNOSIS — M6281 Muscle weakness (generalized): Secondary | ICD-10-CM | POA: Diagnosis not present

## 2020-07-30 DIAGNOSIS — H903 Sensorineural hearing loss, bilateral: Secondary | ICD-10-CM | POA: Diagnosis not present

## 2020-07-30 DIAGNOSIS — T82868D Thrombosis of vascular prosthetic devices, implants and grafts, subsequent encounter: Secondary | ICD-10-CM | POA: Diagnosis not present

## 2020-07-30 DIAGNOSIS — M810 Age-related osteoporosis without current pathological fracture: Secondary | ICD-10-CM | POA: Diagnosis not present

## 2020-07-30 DIAGNOSIS — Z48812 Encounter for surgical aftercare following surgery on the circulatory system: Secondary | ICD-10-CM | POA: Diagnosis not present

## 2020-07-30 DIAGNOSIS — H409 Unspecified glaucoma: Secondary | ICD-10-CM | POA: Diagnosis not present

## 2020-07-30 DIAGNOSIS — M6281 Muscle weakness (generalized): Secondary | ICD-10-CM | POA: Diagnosis not present

## 2020-07-30 DIAGNOSIS — E785 Hyperlipidemia, unspecified: Secondary | ICD-10-CM | POA: Diagnosis not present

## 2020-07-30 DIAGNOSIS — I739 Peripheral vascular disease, unspecified: Secondary | ICD-10-CM | POA: Diagnosis not present

## 2020-07-30 DIAGNOSIS — M1990 Unspecified osteoarthritis, unspecified site: Secondary | ICD-10-CM | POA: Diagnosis not present

## 2020-07-30 DIAGNOSIS — I119 Hypertensive heart disease without heart failure: Secondary | ICD-10-CM | POA: Diagnosis not present

## 2020-07-30 DIAGNOSIS — R55 Syncope and collapse: Secondary | ICD-10-CM | POA: Diagnosis not present

## 2020-08-02 ENCOUNTER — Encounter: Payer: Self-pay | Admitting: Physician Assistant

## 2020-08-02 ENCOUNTER — Ambulatory Visit (INDEPENDENT_AMBULATORY_CARE_PROVIDER_SITE_OTHER): Payer: Medicare Other | Admitting: Physician Assistant

## 2020-08-02 ENCOUNTER — Other Ambulatory Visit: Payer: Self-pay

## 2020-08-02 VITALS — BP 137/71 | HR 65 | Temp 97.2°F | Resp 20 | Ht 67.0 in | Wt 109.0 lb

## 2020-08-02 DIAGNOSIS — E785 Hyperlipidemia, unspecified: Secondary | ICD-10-CM | POA: Diagnosis not present

## 2020-08-02 DIAGNOSIS — T82868D Thrombosis of vascular prosthetic devices, implants and grafts, subsequent encounter: Secondary | ICD-10-CM

## 2020-08-02 DIAGNOSIS — H409 Unspecified glaucoma: Secondary | ICD-10-CM | POA: Diagnosis not present

## 2020-08-02 DIAGNOSIS — H903 Sensorineural hearing loss, bilateral: Secondary | ICD-10-CM | POA: Diagnosis not present

## 2020-08-02 DIAGNOSIS — M810 Age-related osteoporosis without current pathological fracture: Secondary | ICD-10-CM | POA: Diagnosis not present

## 2020-08-02 DIAGNOSIS — I119 Hypertensive heart disease without heart failure: Secondary | ICD-10-CM | POA: Diagnosis not present

## 2020-08-02 DIAGNOSIS — M1990 Unspecified osteoarthritis, unspecified site: Secondary | ICD-10-CM | POA: Diagnosis not present

## 2020-08-02 DIAGNOSIS — Z48812 Encounter for surgical aftercare following surgery on the circulatory system: Secondary | ICD-10-CM | POA: Diagnosis not present

## 2020-08-02 DIAGNOSIS — I739 Peripheral vascular disease, unspecified: Secondary | ICD-10-CM

## 2020-08-02 DIAGNOSIS — M6281 Muscle weakness (generalized): Secondary | ICD-10-CM | POA: Diagnosis not present

## 2020-08-02 DIAGNOSIS — R55 Syncope and collapse: Secondary | ICD-10-CM | POA: Diagnosis not present

## 2020-08-02 DIAGNOSIS — I743 Embolism and thrombosis of arteries of the lower extremities: Secondary | ICD-10-CM

## 2020-08-02 NOTE — Progress Notes (Signed)
POST OPERATIVE OFFICE NOTE    CC:  F/u for surgery  HPI:  This is a 80 y.o. female who is s/p redo left femoral artery exposure and thrombectomy of left limb of aortobifemoral bypass graft, profunda and SFA on 07/11/2020 by Dr. Trula Slade.  POD 1, pt had palapble pedal pulses bilaterally and motor function was back to normal with near resolution of her numbness and no evidence of compartment syndrome.  She was continued on Plavix and asa and statin at discharged.   Pt returns today with her daughter for follow up.  Pt states that on Saturday she moved her left leg really fast and got a 10/10 pain in the medial aspect of her left thigh. She says it is still tender to the touch. She also still has some numbness in her left foot. She says it is a hard feeling to describe. Otherwise she feels that she has been doing well. Her groin has healed up well.   Aortobifemoral bypass was done on 05/08/2007 by Dr. Kellie Simmering for aortoiliac occlusive disease.   Allergies  Allergen Reactions  . Hctz [Hydrochlorothiazide] Other (See Comments)    Dizziness, syncope; does NOT wish to take anymore    Current Outpatient Medications  Medication Sig Dispense Refill  . acetaminophen (TYLENOL) 500 MG tablet Take 1,000 mg by mouth every 6 (six) hours as needed for moderate pain or headache.    Marland Kitchen acyclovir (ZOVIRAX) 400 MG tablet TAKE 1 TABLET BY MOUTH THREE TIMES DAILY AS NEEDED FOR OUTBREAKS FOR SEVEN DAYS (Patient taking differently: Take 400 mg by mouth 3 (three) times daily as needed (outbreaks).) 21 tablet 5  . alendronate (FOSAMAX) 70 MG tablet Take 1 tablet (70 mg total) by mouth once a week. Take with a full glass of water on an empty stomach. (Patient taking differently: Take 70 mg by mouth every Sunday. Take with a full glass of water on an empty stomach.) 12 tablet 3  . ALPRAZolam (XANAX) 0.5 MG tablet TAKE 1 OR 2 TABLETS BY MOUTH AT BEDTIME AS NEEDED FOR ANXIETY OR SLEEP (Patient taking differently: Take 0.5-1 mg  by mouth at bedtime.) 60 tablet 1  . amLODipine (NORVASC) 10 MG tablet TAKE 1 TABLET BY MOUTH DAILY (Patient taking differently: Take 10 mg by mouth daily.) 30 tablet 3  . aspirin 81 MG EC tablet Take 81 mg by mouth daily.    Marland Kitchen atorvastatin (LIPITOR) 10 MG tablet TAKE 1 TABLET BY MOUTH EVERY DAY (STOP LOVASTATIN) (Patient taking differently: Take 10 mg by mouth at bedtime.) 30 tablet 3  . atropine 1 % ophthalmic solution Place 1 drop into both eyes in the morning and at bedtime.    . benazepril (LOTENSIN) 40 MG tablet Take 1 tablet (40 mg total) by mouth daily. 90 tablet 3  . BIKTARVY 50-200-25 MG TABS tablet TAKE 1 TABLET BY MOUTH DAILY (Patient taking differently: Take 1 tablet by mouth daily.) 30 tablet 5  . clopidogrel (PLAVIX) 75 MG tablet Take 1 tablet (75 mg total) by mouth daily. 30 tablet 3  . COMBIGAN 0.2-0.5 % ophthalmic solution Place 1 drop into the left eye 2 (two) times daily.     . diclofenac Sodium (VOLTAREN) 1 % GEL APPLY TWO GRAMS TO AFFECTED AREA(S) FOUR TIMES DAILY (Patient taking differently: Apply 2 g topically 4 (four) times daily as needed (hip/shoulder pain).) 100 g 5  . dorzolamide (TRUSOPT) 2 % ophthalmic solution Place 1 drop into both eyes 2 (two) times daily.  5  .  latanoprost (XALATAN) 0.005 % ophthalmic solution Place 1 drop into both eyes at bedtime.    . Multiple Vitamin (MULTIVITAMIN WITH MINERALS) TABS Take 1 tablet by mouth daily.    Marland Kitchen ofloxacin (OCUFLOX) 0.3 % ophthalmic solution Place 1 drop into the right eye 4 (four) times daily.    . ondansetron (ZOFRAN-ODT) 4 MG disintegrating tablet Take 4 mg by mouth every 6 (six) hours as needed for nausea or vomiting.     . Rimegepant Sulfate (NURTEC) 75 MG TBDP Take 75 mg by mouth every other day. 45 tablet 3  . SUPREP BOWEL PREP KIT 17.5-3.13-1.6 GM/177ML SOLN Take 1 kit by mouth as directed.    . terazosin (HYTRIN) 5 MG capsule Take 1 capsule (5 mg total) by mouth at bedtime. 90 capsule 3  . traMADol (ULTRAM) 50 MG  tablet TAKE TWO TABLETS BY MOUTH EVERY 8 HOURS AS NEEDED FOR SEVERE PAIN (Patient taking differently: Take 100 mg by mouth every 8 (eight) hours as needed for severe pain.) 180 tablet 5  . triamcinolone ointment (KENALOG) 0.1 % APPLY TO THE AFFECTED AREA(S) TWICE DAILY (Patient taking differently: Apply 1 application topically 2 (two) times daily as needed (rash).) 454 g 3   No current facility-administered medications for this visit.     ROS:  See HPI  Physical Exam: Vitals:   08/02/20 1458  BP: 137/71  Pulse: 65  Resp: 20  Temp: (!) 97.2 F (36.2 C)  SpO2: 100%    General: well appearing, well nourished, partially blind Incision: left groin incision is healing well. No swelling or hematoma Extremities: palpable femoral pulses bilaterally, palpable popliteal and DP pulse in left foot. She has some tenderness to palpation along medial aspect of left thigh. No swelling or deformity. No skin changes. Muscles are soft. Neuro: alert and oriented Abdomen:  Flat, non distended   Assessment/Plan:  This is a 80 y.o. female who is s/p redo left femoral artery exposure and thrombectomy of left limb of aortobifemoral bypass graft, profunda and SFA on 07/11/2020 by Dr. Trula Slade. Her left groin incisions is healing very well. She has palpable femoral, popliteal and DP pulses bilaterally. Based on examination her Aortobifemoral bypass is patent. I think her recent left leg pain likely was a pulled muscle. Recommend warm compresses and Tylenol as needed - Continue Aspirin, statin and plavix -She will return in a couple weeks for her one month follow up with [facility]  [facility] [phone]   Clinic MD: Trula Slade

## 2020-08-03 DIAGNOSIS — I739 Peripheral vascular disease, unspecified: Secondary | ICD-10-CM | POA: Diagnosis not present

## 2020-08-03 DIAGNOSIS — I119 Hypertensive heart disease without heart failure: Secondary | ICD-10-CM | POA: Diagnosis not present

## 2020-08-03 DIAGNOSIS — R55 Syncope and collapse: Secondary | ICD-10-CM | POA: Diagnosis not present

## 2020-08-03 DIAGNOSIS — H903 Sensorineural hearing loss, bilateral: Secondary | ICD-10-CM | POA: Diagnosis not present

## 2020-08-03 DIAGNOSIS — Z48812 Encounter for surgical aftercare following surgery on the circulatory system: Secondary | ICD-10-CM | POA: Diagnosis not present

## 2020-08-03 DIAGNOSIS — M1990 Unspecified osteoarthritis, unspecified site: Secondary | ICD-10-CM | POA: Diagnosis not present

## 2020-08-03 DIAGNOSIS — M6281 Muscle weakness (generalized): Secondary | ICD-10-CM | POA: Diagnosis not present

## 2020-08-03 DIAGNOSIS — T82868D Thrombosis of vascular prosthetic devices, implants and grafts, subsequent encounter: Secondary | ICD-10-CM | POA: Diagnosis not present

## 2020-08-03 DIAGNOSIS — M810 Age-related osteoporosis without current pathological fracture: Secondary | ICD-10-CM | POA: Diagnosis not present

## 2020-08-03 DIAGNOSIS — E785 Hyperlipidemia, unspecified: Secondary | ICD-10-CM | POA: Diagnosis not present

## 2020-08-03 DIAGNOSIS — H409 Unspecified glaucoma: Secondary | ICD-10-CM | POA: Diagnosis not present

## 2020-08-04 ENCOUNTER — Ambulatory Visit (INDEPENDENT_AMBULATORY_CARE_PROVIDER_SITE_OTHER): Payer: Medicare Other | Admitting: Internal Medicine

## 2020-08-04 ENCOUNTER — Other Ambulatory Visit: Payer: Self-pay

## 2020-08-04 ENCOUNTER — Encounter: Payer: Self-pay | Admitting: Internal Medicine

## 2020-08-04 VITALS — BP 107/68 | HR 68 | Temp 97.8°F | Ht 67.0 in | Wt 103.7 lb

## 2020-08-04 DIAGNOSIS — E876 Hypokalemia: Secondary | ICD-10-CM

## 2020-08-04 DIAGNOSIS — D649 Anemia, unspecified: Secondary | ICD-10-CM

## 2020-08-04 DIAGNOSIS — I739 Peripheral vascular disease, unspecified: Secondary | ICD-10-CM | POA: Diagnosis not present

## 2020-08-04 DIAGNOSIS — E785 Hyperlipidemia, unspecified: Secondary | ICD-10-CM | POA: Diagnosis not present

## 2020-08-04 DIAGNOSIS — D62 Acute posthemorrhagic anemia: Secondary | ICD-10-CM

## 2020-08-04 DIAGNOSIS — M810 Age-related osteoporosis without current pathological fracture: Secondary | ICD-10-CM | POA: Diagnosis not present

## 2020-08-04 DIAGNOSIS — M1990 Unspecified osteoarthritis, unspecified site: Secondary | ICD-10-CM | POA: Diagnosis not present

## 2020-08-04 DIAGNOSIS — M6281 Muscle weakness (generalized): Secondary | ICD-10-CM | POA: Diagnosis not present

## 2020-08-04 DIAGNOSIS — Z48812 Encounter for surgical aftercare following surgery on the circulatory system: Secondary | ICD-10-CM | POA: Diagnosis not present

## 2020-08-04 DIAGNOSIS — T82868D Thrombosis of vascular prosthetic devices, implants and grafts, subsequent encounter: Secondary | ICD-10-CM | POA: Diagnosis not present

## 2020-08-04 DIAGNOSIS — H903 Sensorineural hearing loss, bilateral: Secondary | ICD-10-CM | POA: Diagnosis not present

## 2020-08-04 DIAGNOSIS — I119 Hypertensive heart disease without heart failure: Secondary | ICD-10-CM | POA: Diagnosis not present

## 2020-08-04 DIAGNOSIS — R55 Syncope and collapse: Secondary | ICD-10-CM | POA: Diagnosis not present

## 2020-08-04 DIAGNOSIS — H409 Unspecified glaucoma: Secondary | ICD-10-CM | POA: Diagnosis not present

## 2020-08-04 NOTE — Patient Instructions (Signed)
1. Hypokalemia  - BMP8+Anion Gap  2. Anemia, unspecified type  - CBC with Diff  I will call with the results. My best, Dr.Larraine Argo

## 2020-08-04 NOTE — Progress Notes (Signed)
   CC: hypokalemia and Anemia  HPI:[name] is a 80 y.o. female who presents for evaluation of hypokalemia and anemia. Patient recently admitted to hospital for arterial embolism and thrombosis of lower extremity. Please see individual problem based A/P for details.  Past Medical History:  Diagnosis Date  . Acute renal failure (ARF) ([place]) 08/17/2017  . Anxiety 04/05/2012  . Bilateral sensorineural hearing loss 06/11/2014   Mild to moderate on the left side and slight to mild on the right side per audiometry 05/2014.  Hearing aides with possible masking of tinnitus recommended but patient wished to defer secondary to finances.  . Blood transfusion without reported diagnosis    pt denies  . Bursitis of right shoulder 07/12/2012   s/p shoulder injection 07/12/2012   . Cataract of right eye    REMOVED RIGHT EYE 4-19   . Constipation due to pain medication 04/27/2010  . Diverticulosis 02/08/2012   Extensive left-sided diverticula on colonoscopy March 2012 per Dr. Gala Romney   . Essential hypertension 07/20/2006  . Genital herpes 07/20/2006  . 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 Headache 10/20/2019  . Heart murmur 1961  . Human immunodeficiency virus disease ([place]) 03/27/1986  . Hyperlipidemia LDL goal < 100 04/05/2012  . Hypokalemia 04/12/2018  . Insomnia 10/20/2019  . Long-term current use of opiate analgesic 03/17/2016  . Loose stools 04/08/2019  . Lumbar degenerative disc disease 07/20/2006   With chronic back pain   . Marijuana use 07/03/2016  . Micturition syncope 09/20/2015  . Nausea and vomiting 04/08/2019  . Opiate dependence ([place]) 04/12/2018  . 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 Periumbilical hernia 10/16/275   1 cm left periumbilical abdominal wall defect  . 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 Right rotator cuff tear 02/01/2013   Responds to periodic steroid injections  .  Seborrhea 09/01/2010  . Shoulder pain, right 12/04/2017  . Small bowel obstruction due to adhesions ([place]) 02/08/2012   s/p Exploratory laparotomy, lysis of adhesions 02/12/12    . 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 Tobacco abuse 02/19/2012  . Tobacco abuse   . Vasovagal syncope 02/15/2015  . Vitamin D deficiency 05/29/2018   Vitamin D 18.96 (04/30/2018), treated with ergocalciferol 50,000 units PO QWk X 4 weeks  . Voiding dysfunction    s/p cystoscopy and meatal dilation Dec 2005   Review of Systems:   Review of Systems  Constitutional: Negative for chills and fever.  Cardiovascular: Negative for chest pain and palpitations.     Physical Exam: Vitals:   08/04/20 1441  BP: 107/68  Pulse: 68  Temp: 97.8 F (36.6 C)  TempSrc: Oral  SpO2: 100%  Weight: 103 lb 11.2 oz (47 kg)  Height: 5\' 7"  (1.702 m)    General: Underweight, NAD, well kept HEENT: Conjunctiva nl , antiicteric sclerae, moist mucous membranes, no exudate or erythema Cardiovascular: Normal rate, regular rhythm.  No murmurs, rubs, or gallops Pulmonary : Equal breath sounds, No wheezes, rales, or rhonchi Abdominal: soft, nontender,  bowel sounds present Ext: No edema in lower extremities, no tenderness to palpation of lower extremities.   Assessment & Plan:   See Encounters Tab for problem based charting.  Patient discussed with Dr. Philipp Ovens

## 2020-08-05 LAB — CBC WITH DIFFERENTIAL/PLATELET
Basophils Absolute: 0.1 10*3/uL (ref 0.0–0.2)
Basos: 1 %
EOS (ABSOLUTE): 0.4 10*3/uL (ref 0.0–0.4)
Eos: 6 %
Hematocrit: 38.7 % (ref 34.0–46.6)
Hemoglobin: 13.3 g/dL (ref 11.1–15.9)
Immature Grans (Abs): 0 10*3/uL (ref 0.0–0.1)
Immature Granulocytes: 0 %
Lymphocytes Absolute: 3.3 10*3/uL — ABNORMAL HIGH (ref 0.7–3.1)
Lymphs: 48 %
MCH: 33.2 pg — ABNORMAL HIGH (ref 26.6–33.0)
MCHC: 34.4 g/dL (ref 31.5–35.7)
MCV: 97 fL (ref 79–97)
Monocytes Absolute: 0.3 10*3/uL (ref 0.1–0.9)
Monocytes: 5 %
Neutrophils Absolute: 2.7 10*3/uL (ref 1.4–7.0)
Neutrophils: 40 %
Platelets: 304 10*3/uL (ref 150–450)
RBC: 4.01 x10E6/uL (ref 3.77–5.28)
RDW: 11.9 % (ref 11.7–15.4)
WBC: 6.9 10*3/uL (ref 3.4–10.8)

## 2020-08-05 LAB — BMP8+ANION GAP
Anion Gap: 19 mmol/L — ABNORMAL HIGH (ref 10.0–18.0)
BUN/Creatinine Ratio: 42 — ABNORMAL HIGH (ref 12–28)
BUN: 27 mg/dL (ref 8–27)
CO2: 16 mmol/L — ABNORMAL LOW (ref 20–29)
Calcium: 10.4 mg/dL — ABNORMAL HIGH (ref 8.7–10.3)
Chloride: 105 mmol/L (ref 96–106)
Creatinine, Ser: 0.65 mg/dL (ref 0.57–1.00)
Glucose: 95 mg/dL (ref 65–99)
Potassium: 3.9 mmol/L (ref 3.5–5.2)
Sodium: 140 mmol/L (ref 134–144)
eGFR: 91 mL/min/{1.73_m2} (ref >59)

## 2020-08-08 ENCOUNTER — Encounter: Payer: Self-pay | Admitting: Internal Medicine

## 2020-08-08 DIAGNOSIS — D62 Acute posthemorrhagic anemia: Secondary | ICD-10-CM

## 2020-08-08 HISTORY — DX: Acute posthemorrhagic anemia: D62

## 2020-08-08 NOTE — Assessment & Plan Note (Signed)
Acute blood loss surrounding surgery for arterial embolism and thrombosis. Patient is here for hospital follow up . Will check CBC  - CBC show Hgb returned to nl, no further workup

## 2020-08-08 NOTE — Assessment & Plan Note (Addendum)
Patient present for hospital follow up . Potassium 3 on most recent lab work in hospital. Repeat today  Assessment/Plan:  Hypokalemia - BMP8+Anion Gap  Addendum: Potassium 3.9

## 2020-08-10 ENCOUNTER — Telehealth: Payer: Self-pay | Admitting: Internal Medicine

## 2020-08-10 DIAGNOSIS — Z48812 Encounter for surgical aftercare following surgery on the circulatory system: Secondary | ICD-10-CM | POA: Diagnosis not present

## 2020-08-10 DIAGNOSIS — H409 Unspecified glaucoma: Secondary | ICD-10-CM | POA: Diagnosis not present

## 2020-08-10 DIAGNOSIS — I739 Peripheral vascular disease, unspecified: Secondary | ICD-10-CM | POA: Diagnosis not present

## 2020-08-10 DIAGNOSIS — M1990 Unspecified osteoarthritis, unspecified site: Secondary | ICD-10-CM | POA: Diagnosis not present

## 2020-08-10 DIAGNOSIS — M810 Age-related osteoporosis without current pathological fracture: Secondary | ICD-10-CM | POA: Diagnosis not present

## 2020-08-10 DIAGNOSIS — E785 Hyperlipidemia, unspecified: Secondary | ICD-10-CM | POA: Diagnosis not present

## 2020-08-10 DIAGNOSIS — H903 Sensorineural hearing loss, bilateral: Secondary | ICD-10-CM | POA: Diagnosis not present

## 2020-08-10 DIAGNOSIS — M6281 Muscle weakness (generalized): Secondary | ICD-10-CM | POA: Diagnosis not present

## 2020-08-10 DIAGNOSIS — R55 Syncope and collapse: Secondary | ICD-10-CM | POA: Diagnosis not present

## 2020-08-10 DIAGNOSIS — I119 Hypertensive heart disease without heart failure: Secondary | ICD-10-CM | POA: Diagnosis not present

## 2020-08-10 DIAGNOSIS — T82868D Thrombosis of vascular prosthetic devices, implants and grafts, subsequent encounter: Secondary | ICD-10-CM | POA: Diagnosis not present

## 2020-08-10 NOTE — Telephone Encounter (Signed)
Pls contact pt Jessica [name] [phone]  At [facility]

## 2020-08-10 NOTE — Telephone Encounter (Signed)
RTC to [place], PT at [facility] She states she was there for a visit this morning and was ambulating patient when patient c/o dizziness.  Pt was sat down and given OJ.  Patient and caregiver stated that they do not eat until 1100. Pt then transferred to w/c, Darlene w/ [facility] states her skin was "sweaty and cold".  B/p taken 80/50 (b/p before ambulating was 102/58).  More OJ given to patient, patient started feeling better and b/p came back up to 94/58. Carlyon Shadow states she educated patient and caregiver about the importance of eating breakfast in the morning and not waiting until 1100 and they verbalized understanding. When Darlene left the home, she states the patient was sitting up, eating breakfast, smiling, talking and acting appropriately.  TC to patient, home number busy.  TC to mobile number, VM obtained and message left to call [facility] triage back.  Dr. Daryll Drown, Would you like patient to come in for orthostatics? Thx, Jaloni Davoli

## 2020-08-11 NOTE — Telephone Encounter (Signed)
Thank you Stacee

## 2020-08-11 NOTE — Telephone Encounter (Signed)
Yes, or if she can have them done with [place]?  She had recent vascular surgery, so I would like to know what her Blood pressure is doing.

## 2020-08-11 NOTE — Telephone Encounter (Signed)
Jessica [name] with [facility] called and asked if she could go out for a b/p check and get orthostatic b/p's as well.  She states she is not due for a visit until next week.  Verbal order given for a PRN visit for tomorrow morning.  She was asked to call nurse triage back with verbal report. SChaplin, RN,BSN

## 2020-08-12 DIAGNOSIS — H903 Sensorineural hearing loss, bilateral: Secondary | ICD-10-CM | POA: Diagnosis not present

## 2020-08-12 DIAGNOSIS — H409 Unspecified glaucoma: Secondary | ICD-10-CM | POA: Diagnosis not present

## 2020-08-12 DIAGNOSIS — E785 Hyperlipidemia, unspecified: Secondary | ICD-10-CM | POA: Diagnosis not present

## 2020-08-12 DIAGNOSIS — Z48812 Encounter for surgical aftercare following surgery on the circulatory system: Secondary | ICD-10-CM | POA: Diagnosis not present

## 2020-08-12 DIAGNOSIS — M810 Age-related osteoporosis without current pathological fracture: Secondary | ICD-10-CM | POA: Diagnosis not present

## 2020-08-12 DIAGNOSIS — I119 Hypertensive heart disease without heart failure: Secondary | ICD-10-CM | POA: Diagnosis not present

## 2020-08-12 DIAGNOSIS — M6281 Muscle weakness (generalized): Secondary | ICD-10-CM | POA: Diagnosis not present

## 2020-08-12 DIAGNOSIS — M1990 Unspecified osteoarthritis, unspecified site: Secondary | ICD-10-CM | POA: Diagnosis not present

## 2020-08-12 DIAGNOSIS — T82868D Thrombosis of vascular prosthetic devices, implants and grafts, subsequent encounter: Secondary | ICD-10-CM | POA: Diagnosis not present

## 2020-08-12 DIAGNOSIS — R55 Syncope and collapse: Secondary | ICD-10-CM | POA: Diagnosis not present

## 2020-08-12 DIAGNOSIS — I739 Peripheral vascular disease, unspecified: Secondary | ICD-10-CM | POA: Diagnosis not present

## 2020-08-12 NOTE — Telephone Encounter (Signed)
Okay.  Can you ask them to repeat at next scheduled nurse visit?  Thanks

## 2020-08-12 NOTE — Progress Notes (Signed)
Internal Medicine Clinic Attending  Case discussed with Dr. Steen  At the time of the visit.  We reviewed the resident's history and exam and pertinent patient test results.  I agree with the assessment, diagnosis, and plan of care documented in the resident's note.  

## 2020-08-12 NOTE — Telephone Encounter (Signed)
Bruce, OT with [facility] called in to report patient's BPs today:  110/52 sitting 92/48 standing  Patient denies lightheadedness, dizziness.

## 2020-08-15 ENCOUNTER — Other Ambulatory Visit: Payer: Self-pay | Admitting: Internal Medicine

## 2020-08-15 DIAGNOSIS — I10 Essential (primary) hypertension: Secondary | ICD-10-CM

## 2020-08-15 DIAGNOSIS — M5136 Other intervertebral disc degeneration, lumbar region: Secondary | ICD-10-CM

## 2020-08-16 ENCOUNTER — Ambulatory Visit: Payer: Medicare Other | Admitting: Physician Assistant

## 2020-08-16 ENCOUNTER — Ambulatory Visit (HOSPITAL_COMMUNITY)
Admission: RE | Admit: 2020-08-16 | Discharge: 2020-08-16 | Disposition: A | Discharge status: Home / Self Care | Payer: Medicare Other | Source: Ambulatory Visit | Attending: Surgery | Admitting: Surgery

## 2020-08-16 ENCOUNTER — Other Ambulatory Visit: Payer: Self-pay

## 2020-08-16 VITALS — BP 115/68 | HR 55 | Temp 97.2°F | Resp 16 | Ht 67.5 in | Wt 110.0 lb

## 2020-08-16 DIAGNOSIS — I739 Peripheral vascular disease, unspecified: Secondary | ICD-10-CM | POA: Diagnosis not present

## 2020-08-16 NOTE — Progress Notes (Addendum)
Peripheral Arterial Disease Follow-Up   VASCULAR SURGERY ASSESSMENT & PLAN:   Shaneese Tait is a 80 y.o. female who is s/p redo left femoral artery exposure and thrombectomy of left limb of aortobifemoral bypass graft, profunda and SFA on 07/11/2020 by Dr. Trula Slade.  This was performed secondary to ischemic left leg with findings of acute thrombosis of the left limb of the aortobifemoral graft.  The right iliac limb of the graft was widely patent as well as the right common femoral artery.  There were mild areas of narrowing involving the SFA and popliteal artery without evidence for high-grade stenosis or complete occlusion.  Three-vessel runoff was noted to the right ankle.  Her original bypass surgery was performed by Dr. Kellie Simmering in 2009 secondary to severe claudication in both legs.  She returns today for routine follow-up with ABIs.  Her daughter accompanies her today.  The patient complains of intermittent left foot tightness.  She ambulates minimally with a footed cane.  She has occasional pain at nighttime.  She is partially blind. She continues to smoke.  Patent aortobifemoral bypass graft with 1.05 left ABI and non-compressible vessels on the right with normal waveforms and palpable pedal pulses.  Patient is without claudication or rest pain; but ambulates only within her home.   Continue optimal medical management of hypertension and follow-up with primary care physician. Encouraged complete smoking cessation. Continue the following medications: Plavix, aspirin, and statin Follow-up in 9 months with ABIs.  I noticed notation by Dr. Johnsie Cancel of right ICA stenosis and encouraged her daughter to follow-up with their office. SUBJECTIVE:   The patient denies lower extremity pain with exercise or rest pain.  Denies skin loss or ulceration.  PHYSICAL EXAM:   Vitals:   08/16/20 1008  Weight: 110 lb (49.9 kg)  Height: 5' 7.5" (1.715 m)    General appearance: Well-developed,  well-nourished in no apparent distress Neurologic: Alert and oriented x 4. Cardiovascular: Heart rate and rhythm are regular.   Abdomen: No palpable pulsatile mass. Well healed midline incision without evidence of hernia. Extremities: Skin intact.  Both feet are warm and well perfused.  Motor function and sensation intact Pulse exam: 2+ femoral, popliteal, dorsalis pedis, and radial pulses bilaterally.   NON-INVASIVE VASCULAR STUDIES   ABIs 08/16/2020  Right: Resting right ankle-brachial index indicates noncompressible right  lower extremity arteries. The right toe-brachial index is normal. RT great  toe pressure = 127 mmHg. Waveforms are triphasic and biphasic.  Left: Resting left ankle-brachial index is within normal range. No  evidence of significant left lower extremity arterial disease. The left  toe-brachial index is normal. LT Great toe pressure = 115 mmHg. Waveforms are biphasic and triphasic.  PROBLEM LIST:    The patient's past medical history, past surgical history, family history, social history, allergy list and medication list are reviewed.   CURRENT MEDS:    Current Outpatient Medications:  .  acetaminophen (TYLENOL) 500 MG tablet, Take 1,000 mg by mouth every 6 (six) hours as needed for moderate pain or headache., Disp: , Rfl:  .  acyclovir (ZOVIRAX) 400 MG tablet, TAKE 1 TABLET BY MOUTH THREE TIMES DAILY AS NEEDED FOR OUTBREAKS FOR SEVEN DAYS (Patient taking differently: Take 400 mg by mouth 3 (three) times daily as needed (outbreaks).), Disp: 21 tablet, Rfl: 5 .  alendronate (FOSAMAX) 70 MG tablet, Take 1 tablet (70 mg total) by mouth once a week. Take with a full glass of water on an empty stomach. (Patient taking differently:  Take 70 mg by mouth every Sunday. Take with a full glass of water on an empty stomach.), Disp: 12 tablet, Rfl: 3 .  ALPRAZolam (XANAX) 0.5 MG tablet, TAKE 1 OR 2 TABLETS BY MOUTH AT BEDTIME AS NEEDED FOR ANXIETY OR SLEEP (Patient taking  differently: Take 0.5-1 mg by mouth at bedtime.), Disp: 60 tablet, Rfl: 1 .  amLODipine (NORVASC) 10 MG tablet, TAKE 1 TABLET BY MOUTH DAILY (Patient taking differently: Take 10 mg by mouth daily.), Disp: 30 tablet, Rfl: 3 .  aspirin 81 MG EC tablet, Take 81 mg by mouth daily., Disp: , Rfl:  .  atorvastatin (LIPITOR) 10 MG tablet, TAKE 1 TABLET BY MOUTH EVERY DAY (STOP LOVASTATIN) (Patient taking differently: Take 10 mg by mouth at bedtime.), Disp: 30 tablet, Rfl: 3 .  atropine 1 % ophthalmic solution, Place 1 drop into both eyes in the morning and at bedtime., Disp: , Rfl:  .  benazepril (LOTENSIN) 40 MG tablet, Take 1 tablet (40 mg total) by mouth daily., Disp: 90 tablet, Rfl: 3 .  BIKTARVY 50-200-25 MG TABS tablet, TAKE 1 TABLET BY MOUTH DAILY (Patient taking differently: Take 1 tablet by mouth daily.), Disp: 30 tablet, Rfl: 5 .  clopidogrel (PLAVIX) 75 MG tablet, Take 1 tablet (75 mg total) by mouth daily., Disp: 30 tablet, Rfl: 3 .  COMBIGAN 0.2-0.5 % ophthalmic solution, Place 1 drop into the left eye 2 (two) times daily. , Disp: , Rfl:  .  diclofenac Sodium (VOLTAREN) 1 % GEL, APPLY TWO GRAMS TO AFFECTED AREA(S) FOUR TIMES DAILY (Patient taking differently: Apply 2 g topically 4 (four) times daily as needed (hip/shoulder pain).), Disp: 100 g, Rfl: 5 .  dorzolamide (TRUSOPT) 2 % ophthalmic solution, Place 1 drop into both eyes 2 (two) times daily., Disp: , Rfl: 5 .  latanoprost (XALATAN) 0.005 % ophthalmic solution, Place 1 drop into both eyes at bedtime., Disp: , Rfl:  .  Multiple Vitamin (MULTIVITAMIN WITH MINERALS) TABS, Take 1 tablet by mouth daily., Disp: , Rfl:  .  ofloxacin (OCUFLOX) 0.3 % ophthalmic solution, Place 1 drop into the right eye 4 (four) times daily., Disp: , Rfl:  .  ondansetron (ZOFRAN-ODT) 4 MG disintegrating tablet, Take 4 mg by mouth every 6 (six) hours as needed for nausea or vomiting. , Disp: , Rfl:  .  Rimegepant Sulfate (NURTEC) 75 MG TBDP, Take 75 mg by mouth every  other day., Disp: 45 tablet, Rfl: 3 .  SUPREP BOWEL PREP KIT 17.5-3.13-1.6 GM/177ML SOLN, Take 1 kit by mouth as directed., Disp: , Rfl:  .  terazosin (HYTRIN) 5 MG capsule, Take 1 capsule (5 mg total) by mouth at bedtime., Disp: 90 capsule, Rfl: 3 .  traMADol (ULTRAM) 50 MG tablet, TAKE TWO TABLETS BY MOUTH EVERY 8 HOURS AS NEEDED FOR SEVERE PAIN (Patient taking differently: Take 100 mg by mouth every 8 (eight) hours as needed for severe pain.), Disp: 180 tablet, Rfl: 5 .  triamcinolone ointment (KENALOG) 0.1 %, APPLY TO THE AFFECTED AREA(S) TWICE DAILY (Patient taking differently: Apply 1 application topically 2 (two) times daily as needed (rash).), Disp: 454 g, Rfl: 3   REVIEW OF SYSTEMS:   '[X]'  denotes positive finding, '[ ]'  denotes negative finding Cardiac  Comments:  Chest pain or chest pressure:    Shortness of breath upon exertion:    Short of breath when lying flat:    Irregular heart rhythm:        Vascular    Pain in calf,  thigh, or hip brought on by ambulation:    Pain in feet at night that wakes you up from your sleep:     Blood clot in your veins:    Leg swelling:         Pulmonary    Oxygen at home:    Productive cough:     Wheezing:         Neurologic    Sudden weakness in arms or legs:     Sudden numbness in arms or legs:     Sudden onset of difficulty speaking or slurred speech:    Temporary loss of vision in one eye:     Problems with dizziness:         Gastrointestinal    Blood in stool:     Vomited blood:         Genitourinary    Burning when urinating:     Blood in urine:        Psychiatric    Major depression:         Hematologic    Bleeding problems:    Problems with blood clotting too easily:        Skin    Rashes or ulcers:        Constitutional    Fever or chills:     Barbie Banner, PA-C  Office: [phone] 08/16/2020  [facility] MD: Dr Stanford Breed on-call

## 2020-08-17 ENCOUNTER — Other Ambulatory Visit: Payer: Self-pay

## 2020-08-17 DIAGNOSIS — I739 Peripheral vascular disease, unspecified: Secondary | ICD-10-CM

## 2020-08-18 DIAGNOSIS — H26491 Other secondary cataract, right eye: Secondary | ICD-10-CM | POA: Diagnosis not present

## 2020-08-24 ENCOUNTER — Other Ambulatory Visit: Payer: Self-pay | Admitting: Infectious Diseases

## 2020-08-24 DIAGNOSIS — B20 Human immunodeficiency virus [HIV] disease: Secondary | ICD-10-CM

## 2020-08-26 ENCOUNTER — Other Ambulatory Visit: Payer: Self-pay | Admitting: Internal Medicine

## 2020-08-26 DIAGNOSIS — F411 Generalized anxiety disorder: Secondary | ICD-10-CM

## 2020-09-10 ENCOUNTER — Encounter: Payer: Medicare Other | Admitting: Internal Medicine

## 2020-09-10 DIAGNOSIS — M810 Age-related osteoporosis without current pathological fracture: Secondary | ICD-10-CM | POA: Diagnosis not present

## 2020-09-14 ENCOUNTER — Encounter: Payer: Self-pay | Admitting: *Deleted

## 2020-09-14 ENCOUNTER — Ambulatory Visit: Payer: Medicare HMO | Admitting: Infectious Disease

## 2020-09-15 ENCOUNTER — Ambulatory Visit: Payer: Medicare Other

## 2020-09-15 ENCOUNTER — Ambulatory Visit (INDEPENDENT_AMBULATORY_CARE_PROVIDER_SITE_OTHER): Payer: Medicare Other | Admitting: Infectious Disease

## 2020-09-15 ENCOUNTER — Other Ambulatory Visit: Payer: Self-pay

## 2020-09-15 ENCOUNTER — Other Ambulatory Visit (HOSPITAL_COMMUNITY)
Admission: RE | Admit: 2020-09-15 | Discharge: 2020-09-15 | Disposition: A | Discharge status: Home / Self Care | Payer: Medicare Other | Source: Ambulatory Visit | Attending: Infectious Disease | Admitting: Infectious Disease

## 2020-09-15 VITALS — BP 107/63 | HR 63 | Temp 97.7°F | Resp 16 | Ht 67.5 in | Wt 110.0 lb

## 2020-09-15 DIAGNOSIS — F17200 Nicotine dependence, unspecified, uncomplicated: Secondary | ICD-10-CM

## 2020-09-15 DIAGNOSIS — I10 Essential (primary) hypertension: Secondary | ICD-10-CM

## 2020-09-15 DIAGNOSIS — I739 Peripheral vascular disease, unspecified: Secondary | ICD-10-CM

## 2020-09-15 DIAGNOSIS — Z8742 Personal history of other diseases of the female genital tract: Secondary | ICD-10-CM

## 2020-09-15 DIAGNOSIS — R55 Syncope and collapse: Secondary | ICD-10-CM

## 2020-09-15 DIAGNOSIS — H409 Unspecified glaucoma: Secondary | ICD-10-CM | POA: Diagnosis not present

## 2020-09-15 DIAGNOSIS — D62 Acute posthemorrhagic anemia: Secondary | ICD-10-CM

## 2020-09-15 DIAGNOSIS — B20 Human immunodeficiency virus [HIV] disease: Secondary | ICD-10-CM | POA: Insufficient documentation

## 2020-09-15 DIAGNOSIS — Z79899 Other long term (current) drug therapy: Secondary | ICD-10-CM | POA: Diagnosis not present

## 2020-09-15 DIAGNOSIS — A6000 Herpesviral infection of urogenital system, unspecified: Secondary | ICD-10-CM

## 2020-09-15 NOTE — Progress Notes (Signed)
Subjective:  Chief complaint left-sided pain in her thigh  Patient ID: Jessica [name], female    DOB: [birth year], 80 y.o.   MRN: [id number]  HPI  Jessica [name] is a 80 y.o. female with HIV infection who has done superbly well on her antiviral regimen, changed from Atripla to [facility] and DESCOVY -->BIKTARVY with reasonable virological suppression.  Since I last saw her she suffered a significant arterial thrombus requiring intervention by vascular surgery with left femoral arterial thrombectomy.  This operative course was complicated by blood loss.  She has done well postsurgery but did develop what sounds to be a musculoskeletal strain in the left leg and was evaluated with vascular surgery.  Unfortunately because she continues to smoke cigarettes I have encouraged her to stop this and at minimum change over to vaping rather than conventional cigarette use.  She is very much interested inn [place] [facility] for HIV.  She has lost vision in her left eye right eye still has some vision and is improved after cataract surgery.  Has been suffering from glaucoma followed closely by ophthalmology.  She is followed closely internal medicine for multiple medical problems including hypertension smoking anxiety, opioid dependence.      Past Medical History:  Diagnosis Date   Acute blood loss anemia 08/08/2020   Acute renal failure (ARF) ([place]) 08/17/2017   Anxiety 04/05/2012   Bilateral sensorineural hearing loss 06/11/2014   Mild to moderate on the left side and slight to mild on the right side per audiometry 05/2014.  Hearing aides with possible masking of tinnitus recommended but patient wished to defer secondary to finances.   Blood transfusion without reported diagnosis    pt denies   Bursitis of right shoulder 07/12/2012   s/p shoulder injection 07/12/2012    Cataract of right eye    REMOVED RIGHT EYE 4-19    Constipation due to pain medication 04/27/2010   Diverticulosis  02/08/2012   Extensive left-sided diverticula on colonoscopy March 2012 per Dr. Gala Romney    Essential hypertension 07/20/2006   Genital herpes 07/20/2006   Glaucoma of left eye 07/20/2006   Headache 10/20/2019   Heart murmur 1961   Human immunodeficiency virus disease ([place]) 03/27/1986   Hyperlipidemia LDL goal < 100 04/05/2012   Hypokalemia 04/12/2018   Insomnia 10/20/2019   Long-term current use of opiate analgesic 03/17/2016   Loose stools 04/08/2019   Lumbar degenerative disc disease 07/20/2006   With chronic back pain    Marijuana use 07/03/2016   Micturition syncope 09/20/2015   Nausea and vomiting 04/08/2019   Opiate dependence ([place]) 04/12/2018   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[place]) 11/01/2011   s/p aortobifem bypass 2951    Periumbilical hernia 10/18/4164   1 cm left periumbilical abdominal wall defect   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   DEXA 04/15/2012: L1-L4 spine T -3.9, Right femur T -3.0    Right rotator cuff tear 02/01/2013   Responds to periodic steroid injections   Seborrhea 09/01/2010   Shoulder pain, right 12/04/2017   Small bowel obstruction due to adhesions ([place]) 02/08/2012   s/p Exploratory laparotomy, lysis of adhesions 02/12/12     Subjective tinnitus of both ears 05/18/2014   Tobacco abuse 02/19/2012   Tobacco abuse    Vasovagal syncope 02/15/2015   Vitamin D deficiency 05/29/2018   Vitamin D 18.96 (04/30/2018), treated with ergocalciferol 50,000 units PO QWk X 4 weeks   Voiding dysfunction    s/p cystoscopy and meatal dilation Dec 2005  Past Surgical History:  Procedure Laterality Date   ABDOMINAL HYSTERECTOMY     AORTO-FEMORAL BYPASS GRAFT  04/2007   APPENDECTOMY     BREAST SURGERY     Breast biopsy: negative   CHOLECYSTECTOMY     COLECTOMY  01/2011   Dr. Margot Chimes; "took out 12 inches of small intestiines and removed blockage"   COLONOSCOPY  2012   EYE SURGERY     EYE SURGERY  06/29/2020   06-29-2020- RIGHT CATARACT REMOVED AND LEFT EYE SURGERY TO REMOVE SAND LIKE  SUBSTANCE    FEMORAL ARTERY EXPLORATION Left 07/10/2020   Procedure: REDO LEFT FEMORAL ARTERY EXPOSURE;  Surgeon: Serafina Mitchell, MD;  Location: [place];  Service: Vascular;  Laterality: Left;   LAPAROTOMY  02/12/2012   Procedure: EXPLORATORY LAPAROTOMY;  Surgeon: Stark Klein, MD;  Location: [place];  Service: General;  Laterality: N/A;  Exploratory Laparotomy, lysis of adhesions   SMALL INTESTINE SURGERY     THROMBECTOMY FEMORAL ARTERY Left 07/10/2020   Procedure: THROMBECTOMY AORTA-BIFEMORAL GRAFT, PROFUNDA, AND SUPERFICIAL FEMORAL ARTERY  LEFT LEG;  Surgeon: Serafina Mitchell, MD;  Location: [facility];  Service: Vascular;  Laterality: Left;    Family History  Problem Relation Age of Onset   Kidney failure Mother    Diabetes Mother    Hypertension Mother    Heart disease Mother    Glaucoma Father    Congestive Heart Failure Sister    Diabetes Sister    Kidney disease Sister    Diabetes Brother    Unexplained death Brother 29       Automobile accident   Hypothyroidism Daughter    Arthritis Daughter        Neck/Back   Healthy Son    HIV/AIDS Brother    HIV Daughter    Kidney disease Daughter    Arthritis Son        Knee   Colon cancer Neg Hx    Colon polyps Neg Hx    Esophageal cancer Neg Hx    Rectal cancer Neg Hx    Stomach cancer Neg Hx       Social History   Socioeconomic History   Marital status: Widowed    Spouse name: Not on file   Number of children: 4   Years of education: 2y college   Highest education level: Not on file  Occupational History   Occupation: retired    Comment: previously worked as a Designer, fashion/clothing for [employer]   Tobacco Use   Smoking status: Every Day    Packs/day: 0.50    Years: 50.00    Pack years: 25.00    Types: Cigarettes   Smokeless tobacco: Never  Vaping Use   Vaping Use: Never used  Substance and Sexual Activity   Alcohol use: No    Alcohol/week: 0.0 standard drinks    Comment: "last drink of alcohol ~ 1977"   Drug use: No     Comment: cutting back on chantix, 1 cigarette this morning   Sexual activity: Never  Other Topics Concern   Not on file  Social History Narrative   Lives alone in [place], [place]   Social Determinants of Health   Financial Resource Strain: Not on file  Food Insecurity: Not on file  Transportation Needs: Not on file  Physical Activity: Not on file  Stress: Not on file  Social Connections: Not on file    Allergies  Allergen Reactions   Hctz [Hydrochlorothiazide] Other (See Comments)    Dizziness,  syncope; does NOT wish to take anymore     Current Outpatient Medications:    acetaminophen (TYLENOL) 500 MG tablet, Take 1,000 mg by mouth every 6 (six) hours as needed for moderate pain or headache., Disp: , Rfl:    acyclovir (ZOVIRAX) 400 MG tablet, TAKE 1 TABLET BY MOUTH THREE TIMES DAILY AS NEEDED FOR OUTBREAKS FOR SEVEN DAYS (Patient taking differently: Take 400 mg by mouth 3 (three) times daily as needed (outbreaks).), Disp: 21 tablet, Rfl: 5   alendronate (FOSAMAX) 70 MG tablet, Take 1 tablet (70 mg total) by mouth once a week. Take with a full glass of water on an empty stomach. (Patient taking differently: Take 70 mg by mouth every Sunday. Take with a full glass of water on an empty stomach.), Disp: 12 tablet, Rfl: 3   ALPRAZolam (XANAX) 0.5 MG tablet, Take 1-2 tablets (0.5-1 mg total) by mouth at bedtime., Disp: 60 tablet, Rfl: 1   amLODipine (NORVASC) 10 MG tablet, TAKE 1 TABLET BY MOUTH DAILY, Disp: 30 tablet, Rfl: 3   atorvastatin (LIPITOR) 10 MG tablet, TAKE 1 TABLET BY MOUTH EVERY DAY (STOP LOVASTATIN) (Patient taking differently: Take 10 mg by mouth at bedtime.), Disp: 30 tablet, Rfl: 3   clopidogrel (PLAVIX) 75 MG tablet, Take by mouth., Disp: , Rfl:    ondansetron (ZOFRAN-ODT) 4 MG disintegrating tablet, Take 4 mg by mouth every 6 (six) hours as needed for nausea or vomiting. , Disp: , Rfl:    prednisoLONE acetate (PRED FORTE) 1 % ophthalmic suspension, Administer 1 drop to both  eyes four (4) times a day., Disp: , Rfl:    terazosin (HYTRIN) 5 MG capsule, Take 1 capsule (5 mg total) by mouth at bedtime., Disp: 90 capsule, Rfl: 3   traMADol (ULTRAM) 50 MG tablet, TAKE TWO TABLETS BY MOUTH EVERY 8 HOURS AS NEEDED FOR SEVERE PAIN, Disp: 180 tablet, Rfl: 5   triamcinolone ointment (KENALOG) 0.1 %, APPLY TO THE AFFECTED AREA(S) TWICE DAILY (Patient taking differently: Apply 1 application topically 2 (two) times daily as needed (rash).), Disp: 454 g, Rfl: 3   atropine 1 % ophthalmic solution, Apply to eye., Disp: , Rfl:    benazepril (LOTENSIN) 40 MG tablet, Take 1 tablet (40 mg total) by mouth daily., Disp: 90 tablet, Rfl: 3   BIKTARVY 50-200-25 MG TABS tablet, TAKE 1 TABLET BY MOUTH DAILY (Patient taking differently: Take 1 tablet by mouth daily.), Disp: 30 tablet, Rfl: 5   COMBIGAN 0.2-0.5 % ophthalmic solution, Place 1 drop into the left eye 2 (two) times daily. , Disp: , Rfl:    diclofenac Sodium (VOLTAREN) 1 % GEL, APPLY TWO GRAMS TO AFFECTED AREA(S) FOUR TIMES DAILY (Patient taking differently: Apply 2 g topically 4 (four) times daily as needed (hip/shoulder pain).), Disp: 100 g, Rfl: 5   dorzolamide (TRUSOPT) 2 % ophthalmic solution, Place 1 drop into both eyes 2 (two) times daily., Disp: , Rfl: 5   latanoprost (XALATAN) 0.005 % ophthalmic solution, Place 1 drop into both eyes at bedtime., Disp: , Rfl:    Multiple Vitamin (MULTIVITAMIN WITH MINERALS) TABS, Take 1 tablet by mouth daily., Disp: , Rfl:    Rimegepant Sulfate (NURTEC) 75 MG TBDP, Take 75 mg by mouth every other day. (Patient not taking: Reported on 09/15/2020), Disp: 45 tablet, Rfl: 3   SUPREP BOWEL PREP KIT 17.5-3.13-1.6 GM/177ML SOLN, Take 1 kit by mouth as directed. (Patient not taking: Reported on 09/15/2020), Disp: , Rfl:     Review of Systems  Constitutional:  Negative for activity change, appetite change, chills, diaphoresis, fatigue, fever and unexpected weight change.  HENT:  Negative for congestion,  rhinorrhea, sinus pressure, sneezing, sore throat and trouble swallowing.   Eyes:  Negative for photophobia and visual disturbance.  Respiratory:  Negative for cough, chest tightness, shortness of breath, wheezing and stridor.   Cardiovascular:  Negative for chest pain, palpitations and leg swelling.  Gastrointestinal:  Negative for abdominal distention, abdominal pain, anal bleeding, blood in stool, diarrhea, nausea and vomiting.  Genitourinary:  Negative for difficulty urinating, dysuria, flank pain and hematuria.  Musculoskeletal:  Negative for arthralgias, back pain, gait problem, joint swelling and myalgias.  Skin:  Negative for color change, pallor, rash and wound.  Neurological:  Negative for dizziness, tremors, weakness and light-headedness.  Hematological:  Negative for adenopathy. Does not bruise/bleed easily.  Psychiatric/Behavioral:  Negative for agitation, behavioral problems, confusion, decreased concentration, dysphoric mood and sleep disturbance.       Objective:   Physical Exam Constitutional:      General: She is not in acute distress.    Appearance: Normal appearance. She is well-developed. She is not ill-appearing or diaphoretic.  HENT:     Head: Normocephalic and atraumatic.     Right Ear: Hearing and external ear normal.     Left Ear: Hearing and external ear normal.     Nose: No nasal deformity or rhinorrhea.  Eyes:     General: No scleral icterus.    Extraocular Movements: Extraocular movements intact.     Conjunctiva/sclera: Conjunctivae normal.     Right eye: Right conjunctiva is not injected.     Left eye: Left conjunctiva is not injected.  Neck:     Vascular: No JVD.  Cardiovascular:     Rate and Rhythm: Normal rate and regular rhythm.     Heart sounds: S1 normal and S2 normal.  Pulmonary:     Effort: Pulmonary effort is normal. No respiratory distress.     Breath sounds: No wheezing.  Abdominal:     General: Bowel sounds are normal. There is no  distension.     Palpations: Abdomen is soft.     Tenderness: There is no abdominal tenderness.  Musculoskeletal:        General: Normal range of motion.     Right shoulder: Normal.     Left shoulder: Normal.     Cervical back: Normal range of motion and neck supple.     Right hip: Normal.     Left hip: Normal.     Right knee: Normal.     Left knee: Normal.  Lymphadenopathy:     Head:     Right side of head: No submandibular, preauricular or posterior auricular adenopathy.     Left side of head: No submandibular, preauricular or posterior auricular adenopathy.     Cervical: No cervical adenopathy.     Right cervical: No superficial or deep cervical adenopathy.    Left cervical: No superficial or deep cervical adenopathy.  Skin:    General: Skin is warm and dry.     Coloration: Skin is not pale.     Findings: No abrasion, bruising, ecchymosis, erythema, lesion or rash.     Nails: There is no clubbing.  Neurological:     Mental Status: She is alert and oriented to person, place, and time.     Sensory: No sensory deficit.     Coordination: Coordination normal.     Gait: Gait normal.  Psychiatric:  Attention and Perception: She is attentive.        Mood and Affect: Mood normal.        Speech: Speech normal.        Behavior: Behavior normal. Behavior is cooperative.        Thought Content: Thought content normal.        Judgment: Judgment normal.          Assessment & Plan:  HIV disease  She was interested in [place]. I do NOT think this is a great idea for her. While she does not have hx of virological failure with R. However if she has IM injections on antiplatelet therapy should be at risk for bleeding and bruising.  Also mention to her the fact that there is a near 1% chance of urological failure with resistance in patients who actually do get their medications taken exactly on time.  I do not feel like a 1% chance of risk for urological failure is worth it in her.   Additionally removing 1 medications from the many that she takes is in my opinion not going to significantly unburden her in terms of pill fatigue or pill burden.  Understanding  that there is risk of virological failure she is decided to maintain on single tablet regimen with BIKTARVY  COVID prevention: encouraged 2nd booster  Vasovagal syncope:  not happened recently  HTN followed by PCP  Smoking: encouraged smoking cessation at minimum reduction by aging from conventional cigarette smoking to vaping.  Peripheral vascular disease with recent arterial thrombectomy: Followed by vascular surgery.    Left thigh muscle strain: She was initially concerned that this represented another clot as this is where she had symptoms of pain initially.  Blindness due to glaucoma and also in part due to cataract followed closely by ophthalmology at [facility].  I spent more than 35 minutes with the patient including face to face counseling of the patient personally reviewing radiographs, long with pertinent laboratory microbiological data review of medical records and in coordination of her care.

## 2020-09-16 LAB — URINE CYTOLOGY ANCILLARY ONLY
Chlamydia: NEGATIVE
Comment: NEGATIVE
Comment: NORMAL
Neisseria Gonorrhea: NEGATIVE

## 2020-09-16 LAB — T-HELPER CELL (CD4) - (RCID CLINIC ONLY)
CD4 % Helper T Cell: 42 % (ref 33–65)
CD4 T Cell Abs: 1333 /uL (ref 400–1790)

## 2020-09-17 LAB — CBC WITH DIFFERENTIAL/PLATELET
Absolute Monocytes: 257 cells/uL (ref 200–950)
Basophils Absolute: 47 cells/uL (ref 0–200)
Basophils Relative: 0.6 %
Eosinophils Absolute: 62 cells/uL (ref 15–500)
Eosinophils Relative: 0.8 %
HCT: 40.4 % (ref 35.0–45.0)
Hemoglobin: 13.6 g/dL (ref 11.7–15.5)
Lymphs Abs: 3268 cells/uL (ref 850–3900)
MCH: 33.2 pg — ABNORMAL HIGH (ref 27.0–33.0)
MCHC: 33.7 g/dL (ref 32.0–36.0)
MCV: 98.5 fL (ref 80.0–100.0)
MPV: 9.3 fL (ref 7.5–12.5)
Monocytes Relative: 3.3 %
Neutro Abs: 4165 cells/uL (ref 1500–7800)
Neutrophils Relative %: 53.4 %
Platelets: 303 10*3/uL (ref 140–400)
RBC: 4.1 10*6/uL (ref 3.80–5.10)
RDW: 11.9 % (ref 11.0–15.0)
Total Lymphocyte: 41.9 %
WBC: 7.8 10*3/uL (ref 3.8–10.8)

## 2020-09-17 LAB — COMPLETE METABOLIC PANEL WITH GFR
AG Ratio: 1.3 (calc) (ref 1.0–2.5)
ALT: 12 U/L (ref 6–29)
AST: 17 U/L (ref 10–35)
Albumin: 4.5 g/dL (ref 3.6–5.1)
Alkaline phosphatase (APISO): 57 U/L (ref 37–153)
BUN: 19 mg/dL (ref 7–25)
CO2: 20 mmol/L (ref 20–32)
Calcium: 10.3 mg/dL (ref 8.6–10.4)
Chloride: 106 mmol/L (ref 98–110)
Creat: 0.81 mg/dL (ref 0.60–0.93)
GFR, Est African American: 81 mL/min/{1.73_m2} (ref >=60)
GFR, Est Non African American: 70 mL/min/{1.73_m2} (ref >=60)
Globulin: 3.5 g/dL (calc) (ref 1.9–3.7)
Glucose, Bld: 93 mg/dL (ref 65–99)
Potassium: 4.4 mmol/L (ref 3.5–5.3)
Sodium: 138 mmol/L (ref 135–146)
Total Bilirubin: 0.5 mg/dL (ref 0.2–1.2)
Total Protein: 8 g/dL (ref 6.1–8.1)

## 2020-09-17 LAB — LIPID PANEL
Cholesterol: 154 mg/dL (ref <200)
HDL: 78 mg/dL (ref >=50)
LDL Cholesterol (Calc): 59 mg/dL (calc)
Non-HDL Cholesterol (Calc): 76 mg/dL (calc) (ref <130)
Total CHOL/HDL Ratio: 2 (calc) (ref <5.0)
Triglycerides: 86 mg/dL (ref <150)

## 2020-09-17 LAB — HIV-1 RNA QUANT-NO REFLEX-BLD
HIV 1 RNA Quant: NOT DETECTED Copies/mL
HIV-1 RNA Quant, Log: NOT DETECTED Log cps/mL

## 2020-09-17 LAB — RPR: RPR Ser Ql: NONREACTIVE

## 2020-09-29 DIAGNOSIS — H401133 Primary open-angle glaucoma, bilateral, severe stage: Secondary | ICD-10-CM | POA: Diagnosis not present

## 2020-10-07 ENCOUNTER — Other Ambulatory Visit: Payer: Self-pay | Admitting: Infectious Disease

## 2020-10-07 ENCOUNTER — Other Ambulatory Visit: Payer: Self-pay | Admitting: Internal Medicine

## 2020-10-07 DIAGNOSIS — E785 Hyperlipidemia, unspecified: Secondary | ICD-10-CM

## 2020-10-15 ENCOUNTER — Ambulatory Visit (INDEPENDENT_AMBULATORY_CARE_PROVIDER_SITE_OTHER): Payer: Medicare Other | Admitting: Student

## 2020-10-15 ENCOUNTER — Other Ambulatory Visit: Payer: Self-pay

## 2020-10-15 ENCOUNTER — Encounter: Payer: Self-pay | Admitting: Student

## 2020-10-15 VITALS — BP 133/56 | HR 63 | Temp 98.0°F | Ht 67.0 in | Wt 113.7 lb

## 2020-10-15 DIAGNOSIS — I10 Essential (primary) hypertension: Secondary | ICD-10-CM

## 2020-10-15 DIAGNOSIS — B20 Human immunodeficiency virus [HIV] disease: Secondary | ICD-10-CM | POA: Diagnosis not present

## 2020-10-15 MED ORDER — TERAZOSIN HCL 5 MG PO CAPS: 5.0000 mg | ORAL_CAPSULE | Freq: Every day | ORAL | 3 refills | Status: DC

## 2020-10-17 NOTE — Progress Notes (Signed)
   CC: Follow up of her chronic medical problems  HPI:  [name] is a 80 y.o. F with PMH per below.   She states she continues to have some discomfort in her L hip. She was told this was related to a muscle strain and would take time to heal. She does not feel like it is related to her recent vascular surgery. Patient has no other complaints and feels her chronic conditions are well controlled.  She does continue to smoke and was counseled on smoking cessation.   Past Medical History:  Diagnosis Date   Acute blood loss anemia 08/08/2020   Acute renal failure (ARF) ([place]) 08/17/2017   Anxiety 04/05/2012   Bilateral sensorineural hearing loss 06/11/2014   Mild to moderate on the left side and slight to mild on the right side per audiometry 05/2014.  Hearing aides with possible masking of tinnitus recommended but patient wished to defer secondary to finances.   Blood transfusion without reported diagnosis    pt denies   Bursitis of right shoulder 07/12/2012   s/p shoulder injection 07/12/2012    Cataract of right eye    REMOVED RIGHT EYE 4-19    Constipation due to pain medication 04/27/2010   Diverticulosis 02/08/2012   Extensive left-sided diverticula on colonoscopy March 2012 per Dr. Gala Romney    Essential hypertension 07/20/2006   Genital herpes 07/20/2006   Glaucoma of left eye 07/20/2006   Headache 10/20/2019   Heart murmur 1961   Human immunodeficiency virus disease ([place]) 03/27/1986   Hyperlipidemia LDL goal < 100 04/05/2012   Hypokalemia 04/12/2018   Insomnia 10/20/2019   Long-term current use of opiate analgesic 03/17/2016   Loose stools 04/08/2019   Lumbar degenerative disc disease 07/20/2006   With chronic back pain    Marijuana use 07/03/2016   Micturition syncope 09/20/2015   Nausea and vomiting 04/08/2019   Opiate dependence ([place]) 04/12/2018   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[place]) 11/01/2011   s/p aortobifem bypass 123XX123    Periumbilical hernia 123XX123   1 cm left  periumbilical abdominal wall defect   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   DEXA 04/15/2012: L1-L4 spine T -3.9, Right femur T -3.0    Right rotator cuff tear 02/01/2013   Responds to periodic steroid injections   Seborrhea 09/01/2010   Shoulder pain, right 12/04/2017   Small bowel obstruction due to adhesions ([place]) 02/08/2012   s/p Exploratory laparotomy, lysis of adhesions 02/12/12     Subjective tinnitus of both ears 05/18/2014   Tobacco abuse 02/19/2012   Tobacco abuse    Vasovagal syncope 02/15/2015   Vitamin D deficiency 05/29/2018   Vitamin D 18.96 (04/30/2018), treated with ergocalciferol 50,000 units PO QWk X 4 weeks   Voiding dysfunction    s/p cystoscopy and meatal dilation Dec 2005   Review of Systems:  Negative except per above.   Physical Exam:  Vitals:   10/15/20 1104  BP: (!) 133/56  Pulse: 63  Temp: 98 F (36.7 C)  TempSrc: Oral  SpO2: 100%  Weight: 113 lb 11.2 oz (51.6 kg)  Height: '5\' 7"'$  (1.702 m)   Gen: No acute distress CV: RRR, no murmurs or rubs or gallops Pulm: Non labored breathing on RA, no wheezing or rales Abd: Soft, NT, ND  Ext: No edema of BLE  Neuro: Decreased vision in bilateral eyes, requires cane.   Assessment & Plan:   See Encounters Tab for problem based charting.  Patient discussed with Dr. Heber [place]

## 2020-10-17 NOTE — Assessment & Plan Note (Signed)
Well controlled on [place]. Follows regularly with [facility].  -We will CTM

## 2020-10-17 NOTE — Assessment & Plan Note (Signed)
Well controlled. Denies headaches, lightheadedness, or dizziness.  Plan: Terazosin was reordered. Patient will continue her other medications.

## 2020-10-20 DIAGNOSIS — M810 Age-related osteoporosis without current pathological fracture: Secondary | ICD-10-CM | POA: Diagnosis not present

## 2020-10-25 ENCOUNTER — Other Ambulatory Visit: Payer: Self-pay | Admitting: Internal Medicine

## 2020-10-25 DIAGNOSIS — F411 Generalized anxiety disorder: Secondary | ICD-10-CM

## 2020-10-26 NOTE — Progress Notes (Signed)
Internal Medicine Clinic Attending  Case discussed with Dr. Carter  At the time of the visit.  We reviewed the resident's history and exam and pertinent patient test results.  I agree with the assessment, diagnosis, and plan of care documented in the resident's note.  

## 2020-11-07 ENCOUNTER — Other Ambulatory Visit: Payer: Self-pay | Admitting: Internal Medicine

## 2020-11-09 NOTE — Telephone Encounter (Signed)
Next appt scheduled  04/22/21.

## 2020-11-17 ENCOUNTER — Other Ambulatory Visit: Payer: Self-pay | Admitting: Internal Medicine

## 2020-11-17 DIAGNOSIS — B029 Zoster without complications: Secondary | ICD-10-CM

## 2020-11-20 DIAGNOSIS — M810 Age-related osteoporosis without current pathological fracture: Secondary | ICD-10-CM | POA: Diagnosis not present

## 2020-12-06 DIAGNOSIS — Z961 Presence of intraocular lens: Secondary | ICD-10-CM | POA: Diagnosis not present

## 2020-12-06 DIAGNOSIS — H1820 Unspecified corneal edema: Secondary | ICD-10-CM | POA: Diagnosis not present

## 2020-12-07 ENCOUNTER — Other Ambulatory Visit: Payer: Self-pay | Admitting: Internal Medicine

## 2020-12-07 DIAGNOSIS — I10 Essential (primary) hypertension: Secondary | ICD-10-CM

## 2020-12-13 ENCOUNTER — Other Ambulatory Visit: Payer: Self-pay | Admitting: *Deleted

## 2020-12-13 DIAGNOSIS — B20 Human immunodeficiency virus [HIV] disease: Secondary | ICD-10-CM

## 2020-12-13 MED ORDER — BIKTARVY 50-200-25 MG PO TABS: 1.0000 | ORAL_TABLET | Freq: Every day | ORAL | 5 refills | Status: DC

## 2020-12-19 ENCOUNTER — Other Ambulatory Visit: Payer: Self-pay | Admitting: Internal Medicine

## 2020-12-20 DIAGNOSIS — M810 Age-related osteoporosis without current pathological fracture: Secondary | ICD-10-CM | POA: Diagnosis not present

## 2020-12-21 NOTE — Telephone Encounter (Signed)
Next appt scheduled 04/22/21 with PCP.

## 2020-12-26 ENCOUNTER — Other Ambulatory Visit: Payer: Self-pay | Admitting: Internal Medicine

## 2020-12-26 DIAGNOSIS — F411 Generalized anxiety disorder: Secondary | ICD-10-CM

## 2021-01-14 ENCOUNTER — Telehealth: Payer: Self-pay

## 2021-01-14 NOTE — Telephone Encounter (Signed)
Daughter Jessica [name] calling to inquire about mother getting new application for ADAP. She missed the renewal period due to health problems. Advised that I will have finance call her back on Monday.

## 2021-01-20 DIAGNOSIS — M810 Age-related osteoporosis without current pathological fracture: Secondary | ICD-10-CM | POA: Diagnosis not present

## 2021-02-28 ENCOUNTER — Other Ambulatory Visit: Payer: Self-pay | Admitting: Internal Medicine

## 2021-02-28 ENCOUNTER — Other Ambulatory Visit: Payer: Self-pay

## 2021-02-28 DIAGNOSIS — F411 Generalized anxiety disorder: Secondary | ICD-10-CM

## 2021-02-28 MED ORDER — ALPRAZOLAM 0.5 MG PO TABS: ORAL_TABLET | ORAL | 1 refills | Status: DC

## 2021-03-08 ENCOUNTER — Other Ambulatory Visit: Payer: Self-pay | Admitting: Internal Medicine

## 2021-04-06 ENCOUNTER — Other Ambulatory Visit: Payer: Self-pay | Admitting: Internal Medicine

## 2021-04-06 DIAGNOSIS — M810 Age-related osteoporosis without current pathological fracture: Secondary | ICD-10-CM

## 2021-04-06 DIAGNOSIS — I10 Essential (primary) hypertension: Secondary | ICD-10-CM

## 2021-04-07 DIAGNOSIS — H18231 Secondary corneal edema, right eye: Secondary | ICD-10-CM | POA: Diagnosis not present

## 2021-04-07 DIAGNOSIS — H548 Legal blindness, as defined in USA: Secondary | ICD-10-CM | POA: Insufficient documentation

## 2021-04-11 ENCOUNTER — Ambulatory Visit
Admission: RE | Admit: 2021-04-11 | Discharge: 2021-04-11 | Disposition: A | Discharge status: Home / Self Care | Payer: Medicare Other | Source: Ambulatory Visit | Attending: Infectious Disease | Admitting: Infectious Disease

## 2021-04-11 ENCOUNTER — Other Ambulatory Visit: Payer: Self-pay | Admitting: Infectious Disease

## 2021-04-11 ENCOUNTER — Ambulatory Visit: Payer: Medicare Other | Admitting: Infectious Disease

## 2021-04-11 ENCOUNTER — Other Ambulatory Visit: Payer: Self-pay

## 2021-04-11 ENCOUNTER — Encounter: Payer: Self-pay | Admitting: Infectious Disease

## 2021-04-11 ENCOUNTER — Ambulatory Visit (INDEPENDENT_AMBULATORY_CARE_PROVIDER_SITE_OTHER): Payer: Medicare Other

## 2021-04-11 VITALS — BP 124/77 | HR 54 | Temp 97.5°F | Ht 67.0 in | Wt 112.5 lb

## 2021-04-11 DIAGNOSIS — M79605 Pain in left leg: Secondary | ICD-10-CM | POA: Diagnosis not present

## 2021-04-11 DIAGNOSIS — F17200 Nicotine dependence, unspecified, uncomplicated: Secondary | ICD-10-CM | POA: Diagnosis not present

## 2021-04-11 DIAGNOSIS — M25552 Pain in left hip: Secondary | ICD-10-CM

## 2021-04-11 DIAGNOSIS — I10 Essential (primary) hypertension: Secondary | ICD-10-CM | POA: Diagnosis not present

## 2021-04-11 DIAGNOSIS — Z23 Encounter for immunization: Secondary | ICD-10-CM

## 2021-04-11 DIAGNOSIS — Z79899 Other long term (current) drug therapy: Secondary | ICD-10-CM | POA: Diagnosis not present

## 2021-04-11 DIAGNOSIS — M51369 Other intervertebral disc degeneration, lumbar region without mention of lumbar back pain or lower extremity pain: Secondary | ICD-10-CM

## 2021-04-11 DIAGNOSIS — M79604 Pain in right leg: Secondary | ICD-10-CM

## 2021-04-11 DIAGNOSIS — B20 Human immunodeficiency virus [HIV] disease: Secondary | ICD-10-CM

## 2021-04-11 DIAGNOSIS — E7800 Pure hypercholesterolemia, unspecified: Secondary | ICD-10-CM | POA: Diagnosis not present

## 2021-04-11 DIAGNOSIS — M5136 Other intervertebral disc degeneration, lumbar region: Secondary | ICD-10-CM

## 2021-04-11 DIAGNOSIS — M79606 Pain in leg, unspecified: Secondary | ICD-10-CM

## 2021-04-11 HISTORY — DX: Pain in leg, unspecified: M79.606

## 2021-04-11 HISTORY — DX: Pain in left hip: M25.552

## 2021-04-11 MED ORDER — BIKTARVY 50-200-25 MG PO TABS: 1.0000 | ORAL_TABLET | Freq: Every day | ORAL | 11 refills | Status: DC

## 2021-04-11 NOTE — Progress Notes (Signed)
Subjective:  Chief complaint: Bilateral burning in her lower legs as well as left hip pain  Patient ID: Jessica [name], female    DOB: [birth year], 81 y.o.   MRN: [id number]  HPI  Jessica [name] is a 81 y.o. female with HIV infection who has done superbly well on her antiviral regimen, changed from Atripla to [facility] and DESCOVY -->BIKTARVY with reasonable virological suppression.  He has comorbid smoking coronary artery disease peripheral vascular disease hyperlipidemia hypertension.  She has chronic pain and is followed by internal medicine for all these conditions.  Serous noticed burning pain in her right lower extremity worse than left where she had bypass surgery done.  She does is very worried that something serious is going to happen.  She also is complaining of some left-sided hip pain over the last week.        Past Medical History:  Diagnosis Date   Acute blood loss anemia 08/08/2020   Acute renal failure (ARF) ([place]) 08/17/2017   Anxiety 04/05/2012   Bilateral sensorineural hearing loss 06/11/2014   Mild to moderate on the left side and slight to mild on the right side per audiometry 05/2014.  Hearing aides with possible masking of tinnitus recommended but patient wished to defer secondary to finances.   Blood transfusion without reported diagnosis    pt denies   Bursitis of right shoulder 07/12/2012   s/p shoulder injection 07/12/2012    Cataract of right eye    REMOVED RIGHT EYE 4-19    Constipation due to pain medication 04/27/2010   Diverticulosis 02/08/2012   Extensive left-sided diverticula on colonoscopy March 2012 per Dr. 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   s/p aortobifem bypass 9735    Periumbilical hernia 05/13/9922   1 cm left periumbilical abdominal wall defect   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   DEXA 04/15/2012: L1-L4 spine T -3.9, Right femur T -3.0    Right rotator cuff tear 02/01/2013   Responds to periodic steroid injections   Seborrhea 09/01/2010   Shoulder pain, right 12/04/2017   Small bowel obstruction due to adhesions ([place]) 02/08/2012   s/p Exploratory laparotomy, lysis of adhesions 02/12/12     Subjective tinnitus of both ears 05/18/2014   Tobacco abuse 02/19/2012   Tobacco abuse    Vasovagal syncope 02/15/2015   Vitamin D deficiency 05/29/2018   Vitamin D 18.96 (04/30/2018), treated with ergocalciferol 50,000 units PO QWk X 4 weeks   Voiding dysfunction    s/p cystoscopy and meatal dilation Dec 2005    Past Surgical History:  Procedure Laterality Date   ABDOMINAL HYSTERECTOMY     AORTO-FEMORAL BYPASS GRAFT  04/2007   APPENDECTOMY     BREAST SURGERY     Breast biopsy: negative   CHOLECYSTECTOMY     COLECTOMY  01/2011   Dr. Margot Chimes; "took out 12 inches of small intestiines and  removed blockage"   COLONOSCOPY  2012   EYE SURGERY     EYE SURGERY  06/29/2020   06-29-2020- RIGHT CATARACT REMOVED AND LEFT EYE SURGERY TO REMOVE SAND LIKE SUBSTANCE    FEMORAL ARTERY EXPLORATION Left 07/10/2020   Procedure: REDO LEFT FEMORAL ARTERY EXPOSURE;  Surgeon: Serafina Mitchell, MD;  Location: [facility];  Service: Vascular;  Laterality: Left;   LAPAROTOMY  02/12/2012   Procedure: EXPLORATORY LAPAROTOMY;  Surgeon: Stark Klein, MD;  Location: [facility];  Service: General;  Laterality: N/A;  Exploratory Laparotomy, lysis of adhesions   SMALL INTESTINE SURGERY      THROMBECTOMY FEMORAL ARTERY Left 07/10/2020   Procedure: THROMBECTOMY AORTA-BIFEMORAL GRAFT, PROFUNDA, AND SUPERFICIAL FEMORAL ARTERY  LEFT LEG;  Surgeon: Serafina Mitchell, MD;  Location: [facility];  Service: Vascular;  Laterality: Left;    Family History  Problem Relation Age of Onset   Kidney failure Mother    Diabetes Mother    Hypertension Mother    Heart disease Mother    Glaucoma Father    Congestive Heart Failure Sister    Diabetes Sister    Kidney disease Sister    Diabetes Brother    Unexplained death Brother 69       Automobile accident   Hypothyroidism Daughter    Arthritis Daughter        Neck/Back   Healthy Son    HIV/AIDS Brother    HIV Daughter    Kidney disease Daughter    Arthritis Son        Knee   Colon cancer Neg Hx    Colon polyps Neg Hx    Esophageal cancer Neg Hx    Rectal cancer Neg Hx    Stomach cancer Neg Hx       Social History   Socioeconomic History   Marital status: Widowed    Spouse name: Not on file   Number of children: 4   Years of education: 2y college   Highest education level: Not on file  Occupational History   Occupation: retired    Comment: previously worked as a Designer, fashion/clothing for [employer]   Tobacco Use   Smoking status: Every Day    Packs/day: 0.30    Years: 50.00    Pack years: 15.00    Types: Cigarettes   Smokeless tobacco: Never   Tobacco comments:    States "I like to smoke", not ready to quit  Vaping Use   Vaping Use: Never used  Substance and Sexual Activity   Alcohol use: No    Alcohol/week: 0.0 standard drinks    Comment: "last drink of alcohol ~ 1977"   Drug use: No    Comment: cutting back on chantix, 1 cigarette this morning   Sexual activity: Never  Other Topics Concern   Not on file  Social History Narrative   Lives alone in [place], [place]   Social Determinants of Health   Financial Resource Strain: Not on file  Food Insecurity: Not on file  Transportation Needs: Not on file  Physical Activity: Not on  file  Stress: Not on file  Social Connections: Not on file    Allergies  Allergen Reactions   Hctz [Hydrochlorothiazide] Other (See Comments)    Dizziness, syncope; does NOT wish to take anymore     Current Outpatient Medications:    acetaminophen (TYLENOL) 500 MG tablet, Take 1,000 mg by mouth every 6 (six) hours as needed for moderate pain or headache., Disp: , Rfl:  acyclovir (ZOVIRAX) 400 MG tablet, Take 1 tablet (400 mg total) by mouth 3 (three) times daily as needed (outbreaks)., Disp: 21 tablet, Rfl: 5   alendronate (FOSAMAX) 70 MG tablet, Take 1 tablet (70 mg total) by mouth every Sunday. Take with a full glass of water on an empty stomach., Disp: 12 tablet, Rfl: 3   ALPRAZolam (XANAX) 0.5 MG tablet, TAKE 1 OR 2 TABLETS BY MOUTH AT BEDTIME AS NEEDED FOR ANXIETY OR SLEEP Strength: 0.5 mg, Disp: 60 tablet, Rfl: 1   amLODipine (NORVASC) 10 MG tablet, TAKE 1 TABLET BY MOUTH DAILY, Disp: 30 tablet, Rfl: 3   atorvastatin (LIPITOR) 10 MG tablet, Take 1 tablet (10 mg total) by mouth at bedtime., Disp: 90 tablet, Rfl: 3   atropine 1 % ophthalmic solution, Apply to eye., Disp: , Rfl:    benazepril (LOTENSIN) 40 MG tablet, Take 1 tablet (40 mg total) by mouth daily., Disp: 90 tablet, Rfl: 3   brimonidine (ALPHAGAN) 0.2 % ophthalmic solution, USE 1 DROP IN [facility] EYE TWICE DAILY, Disp: , Rfl:    clopidogrel (PLAVIX) 75 MG tablet, TAKE 1 TABLET BY MOUTH DAILY, Disp: 30 tablet, Rfl: 3   COMBIGAN 0.2-0.5 % ophthalmic solution, Place 1 drop into the left eye 2 (two) times daily. , Disp: , Rfl:    diclofenac Sodium (VOLTAREN) 1 % GEL, Apply 2 g topically 4 (four) times daily as needed (hip/shoulder pain)., Disp: 350 g, Rfl: 2   dorzolamide (TRUSOPT) 2 % ophthalmic solution, Place 1 drop into both eyes 2 (two) times daily., Disp: , Rfl: 5   dorzolamide-timolol (COSOPT) 22.3-6.8 MG/ML ophthalmic solution, INSTILL ONE DROP IN [facility] EYE TWICE DAILY, Disp: , Rfl:    latanoprost (XALATAN) 0.005 %  ophthalmic solution, Administer 1 drop to both eyes nightly., Disp: , Rfl:    Multiple Vitamin (MULTIVITAMIN WITH MINERALS) TABS, Take 1 tablet by mouth daily., Disp: , Rfl:    NURTEC 75 MG TBDP, DISSOLVE ONE TABLET ON OR UNDER THE TONGUE EVERY OTHER DAY, Disp: 45 tablet, Rfl: 3   ondansetron (ZOFRAN-ODT) 4 MG disintegrating tablet, Take 4 mg by mouth every 6 (six) hours as needed for nausea or vomiting. , Disp: , Rfl:    prednisoLONE acetate (PRED FORTE) 1 % ophthalmic suspension, Place 1 drop into the right eye 3 (three) times daily., Disp: , Rfl:    terazosin (HYTRIN) 5 MG capsule, Take 1 capsule (5 mg total) by mouth at bedtime., Disp: 90 capsule, Rfl: 3   traMADol (ULTRAM) 50 MG tablet, TAKE TWO TABLETS BY MOUTH EVERY 8 HOURS AS NEEDED FOR SEVERE PAIN, Disp: 180 tablet, Rfl: 5   triamcinolone ointment (KENALOG) 0.1 %, APPLY TO THE AFFECTED AREA(S) TWICE DAILY (Patient taking differently: Apply 1 application topically 2 (two) times daily as needed (rash).), Disp: 454 g, Rfl: 3   bictegravir-emtricitabine-tenofovir AF (BIKTARVY) 50-200-25 MG TABS tablet, Take 1 tablet by mouth daily., Disp: 30 tablet, Rfl: 11    Review of Systems  Constitutional:  Negative for chills and fever.  HENT:  Negative for congestion and sore throat.   Eyes:  Negative for photophobia.  Respiratory:  Negative for cough, shortness of breath and wheezing.   Cardiovascular:  Negative for chest pain, palpitations and leg swelling.  Gastrointestinal:  Negative for abdominal pain, blood in stool, constipation, diarrhea, nausea and vomiting.  Genitourinary:  Negative for dysuria, flank pain and hematuria.  Musculoskeletal:  Positive for myalgias. Negative for back pain.  Skin:  Negative for rash.  Neurological:  Negative for dizziness, weakness and headaches.  Hematological:  Does not bruise/bleed easily.  Psychiatric/Behavioral:  Negative for behavioral problems, confusion and suicidal ideas.       Objective:    Physical Exam Constitutional:      General: She is not in acute distress.    Appearance: She is not diaphoretic.  HENT:     Head: Normocephalic and atraumatic.     Right Ear: External ear normal.     Left Ear: External ear normal.     Nose: Nose normal.     Mouth/Throat:     Pharynx: No oropharyngeal exudate.  Eyes:     General: No scleral icterus.       Right eye: No discharge.        Left eye: No discharge.     Extraocular Movements: Extraocular movements intact.     Conjunctiva/sclera: Conjunctivae normal.  Cardiovascular:     Rate and Rhythm: Normal rate and regular rhythm.     Heart sounds:    No friction rub.  Pulmonary:     Effort: Pulmonary effort is normal. No respiratory distress.     Breath sounds: No wheezing or rales.  Abdominal:     General: There is no distension.     Palpations: Abdomen is soft.     Tenderness: There is no rebound.  Musculoskeletal:        General: No tenderness.     Cervical back: Normal range of motion and neck supple.     Left hip: No deformity, lacerations or tenderness. Decreased range of motion.     Right lower leg: Normal.     Left lower leg: Normal.     Comments: Pain in inner thigh with internal rotation of hip joint  Lymphadenopathy:     Cervical: No cervical adenopathy.  Skin:    General: Skin is warm and dry.     Coloration: Skin is not jaundiced or pale.     Findings: No erythema, lesion or rash.  Neurological:     General: No focal deficit present.     Mental Status: She is alert and oriented to person, place, and time.     Coordination: Coordination normal.  Psychiatric:        Mood and Affect: Mood normal.        Behavior: Behavior normal.        Thought Content: Thought content normal.        Judgment: Judgment normal.            Assessment & Plan:   Bilateral lower calf pain with exertion and rest:  I am ordering arterial brachial indices given her known peripheral vascular disease  Study hip pain I am  ordering complete films of the hip and pelvis and the left side.  HIV disease:  I will recheck her viral load CD4 count CBC and CMP today  I am continue her BIKTARVY which she is quite happy with.  Smoking: She tells me today that she is going to try to quit smoking "for you Dr. Tommy Medal"   HTN: Blood pressure well controlled currently on [place] and as well as lisinoprilb which will be continued   Vitals:   04/11/21 0954  BP: 124/77  Pulse: (!) 54  Temp: (!) 97.5 F (36.4 C)  SpO2: 99%    Peripheral vascular disease: Followed by vascular surgery status post bypass grafting, pending ABIs today. Vaccine counseling recommended to get updated COVID-19 booster as well as Prevnar 20  which she received today  I spent 43 minutes with the patient including than 50% of the time in face to face counseling of the patient regarding her smoking peripheral vascular disease hyperlipidemia her pain in legs and left hip pain  along with review of medical records in preparation for the visit and during the visit and in coordination of her care.

## 2021-04-11 NOTE — Progress Notes (Signed)
° °  Covid-19 Vaccination Clinic  Name:  Jessica [name]    MRN: [id number] DOB: 1940-05-31  04/11/2021  [name] was observed post Covid-19 immunization for 15 minutes without incident. She was provided with Vaccine Information Sheet and instruction to access the V-Safe system.   [name] was instructed to call 911 with any severe reactions post vaccine: Difficulty breathing  Swelling of face and throat  A fast heartbeat  A bad rash all over body  Dizziness and weakness   Immunizations Administered     Name Date Dose VIS Date Route   Pfizer Covid-19 Vaccine Bivalent Booster 04/11/2021 10:37 AM 0.3 mL 11/10/2020 Intramuscular   Manufacturer: [place]   Lot: [id number]   NDC: [place] , RN

## 2021-04-12 LAB — T-HELPER CELL (CD4) - (RCID CLINIC ONLY)
CD4 % Helper T Cell: 44 % (ref 33–65)
CD4 T Cell Abs: 1389 /uL (ref 400–1790)

## 2021-04-13 LAB — CBC WITH DIFFERENTIAL/PLATELET
Absolute Monocytes: 328 cells/uL (ref 200–950)
Basophils Absolute: 62 cells/uL (ref 0–200)
Basophils Relative: 0.8 %
Eosinophils Absolute: 39 cells/uL (ref 15–500)
Eosinophils Relative: 0.5 %
HCT: 36.4 % (ref 35.0–45.0)
Hemoglobin: 12.4 g/dL (ref 11.7–15.5)
Lymphs Abs: 3276 cells/uL (ref 850–3900)
MCH: 33 pg (ref 27.0–33.0)
MCHC: 34.1 g/dL (ref 32.0–36.0)
MCV: 96.8 fL (ref 80.0–100.0)
MPV: 9.4 fL (ref 7.5–12.5)
Monocytes Relative: 4.2 %
Neutro Abs: 4095 cells/uL (ref 1500–7800)
Neutrophils Relative %: 52.5 %
Platelets: 278 10*3/uL (ref 140–400)
RBC: 3.76 10*6/uL — ABNORMAL LOW (ref 3.80–5.10)
RDW: 11.9 % (ref 11.0–15.0)
Total Lymphocyte: 42 %
WBC: 7.8 10*3/uL (ref 3.8–10.8)

## 2021-04-13 LAB — LIPID PANEL
Cholesterol: 126 mg/dL (ref <200)
HDL: 65 mg/dL (ref >=50)
LDL Cholesterol (Calc): 47 mg/dL (calc)
Non-HDL Cholesterol (Calc): 61 mg/dL (calc) (ref <130)
Total CHOL/HDL Ratio: 1.9 (calc) (ref <5.0)
Triglycerides: 61 mg/dL (ref <150)

## 2021-04-13 LAB — COMPLETE METABOLIC PANEL WITH GFR
AG Ratio: 1.4 (calc) (ref 1.0–2.5)
ALT: 6 U/L (ref 6–29)
AST: 13 U/L (ref 10–35)
Albumin: 4.2 g/dL (ref 3.6–5.1)
Alkaline phosphatase (APISO): 58 U/L (ref 37–153)
BUN: 12 mg/dL (ref 7–25)
CO2: 24 mmol/L (ref 20–32)
Calcium: 9.5 mg/dL (ref 8.6–10.4)
Chloride: 111 mmol/L — ABNORMAL HIGH (ref 98–110)
Creat: 0.73 mg/dL (ref 0.60–1.00)
Globulin: 2.9 g/dL (calc) (ref 1.9–3.7)
Glucose, Bld: 107 mg/dL — ABNORMAL HIGH (ref 65–99)
Potassium: 3.6 mmol/L (ref 3.5–5.3)
Sodium: 141 mmol/L (ref 135–146)
Total Bilirubin: 0.4 mg/dL (ref 0.2–1.2)
Total Protein: 7.1 g/dL (ref 6.1–8.1)
eGFR: 84 mL/min/{1.73_m2} (ref >=60)

## 2021-04-13 LAB — HIV-1 RNA QUANT-NO REFLEX-BLD
HIV 1 RNA Quant: NOT DETECTED Copies/mL
HIV-1 RNA Quant, Log: NOT DETECTED Log cps/mL

## 2021-04-13 LAB — RPR: RPR Ser Ql: NONREACTIVE

## 2021-04-15 ENCOUNTER — Encounter: Payer: Medicare Other | Admitting: Internal Medicine

## 2021-04-18 ENCOUNTER — Encounter (HOSPITAL_COMMUNITY): Payer: Medicare Other

## 2021-04-19 ENCOUNTER — Other Ambulatory Visit: Payer: Self-pay | Admitting: Internal Medicine

## 2021-04-19 DIAGNOSIS — B029 Zoster without complications: Secondary | ICD-10-CM

## 2021-04-22 ENCOUNTER — Encounter: Payer: Medicare Other | Admitting: Internal Medicine

## 2021-04-26 ENCOUNTER — Other Ambulatory Visit: Payer: Self-pay | Admitting: Internal Medicine

## 2021-04-26 DIAGNOSIS — F411 Generalized anxiety disorder: Secondary | ICD-10-CM

## 2021-04-29 ENCOUNTER — Other Ambulatory Visit: Payer: Self-pay

## 2021-04-29 ENCOUNTER — Ambulatory Visit (HOSPITAL_COMMUNITY)
Admission: RE | Admit: 2021-04-29 | Discharge: 2021-04-29 | Disposition: A | Discharge status: Home / Self Care | Payer: Medicare Other | Source: Ambulatory Visit | Attending: Infectious Disease | Admitting: Infectious Disease

## 2021-04-29 DIAGNOSIS — M25552 Pain in left hip: Secondary | ICD-10-CM | POA: Diagnosis not present

## 2021-04-29 DIAGNOSIS — M79605 Pain in left leg: Secondary | ICD-10-CM | POA: Diagnosis not present

## 2021-04-29 DIAGNOSIS — M79604 Pain in right leg: Secondary | ICD-10-CM | POA: Diagnosis not present

## 2021-05-01 ENCOUNTER — Other Ambulatory Visit: Payer: Self-pay | Admitting: Infectious Disease

## 2021-05-01 DIAGNOSIS — I739 Peripheral vascular disease, unspecified: Secondary | ICD-10-CM

## 2021-05-02 ENCOUNTER — Telehealth: Payer: Self-pay

## 2021-05-02 NOTE — Telephone Encounter (Signed)
-----   Message from Truman Hayward, MD sent at 05/01/2021  7:13 PM EST ----- Consult placed forVVS ----- Message ----- From: Interface, [employer] Sent: 04/29/2021  10:28 AM EST To: Truman Hayward, MD

## 2021-05-02 NOTE — Telephone Encounter (Signed)
Called patient to notify her of referral to vascular surgery, no answer and unable to leave voicemail.  Beryle Flock, RN

## 2021-05-03 NOTE — Telephone Encounter (Signed)
Spoke with patient's daughter, [name], to make her aware of referral to vascular surgery. She states she was aware of this.  Beryle Flock, RN

## 2021-06-02 ENCOUNTER — Encounter: Payer: Self-pay | Admitting: Internal Medicine

## 2021-06-03 ENCOUNTER — Ambulatory Visit (INDEPENDENT_AMBULATORY_CARE_PROVIDER_SITE_OTHER): Payer: Medicare Other | Admitting: Internal Medicine

## 2021-06-03 ENCOUNTER — Encounter: Payer: Self-pay | Admitting: Internal Medicine

## 2021-06-03 ENCOUNTER — Other Ambulatory Visit: Payer: Self-pay

## 2021-06-03 VITALS — BP 158/65 | HR 57 | Temp 97.7°F | Ht 67.0 in | Wt 119.2 lb

## 2021-06-03 DIAGNOSIS — F17200 Nicotine dependence, unspecified, uncomplicated: Secondary | ICD-10-CM | POA: Diagnosis not present

## 2021-06-03 DIAGNOSIS — M1612 Unilateral primary osteoarthritis, left hip: Secondary | ICD-10-CM | POA: Diagnosis not present

## 2021-06-03 DIAGNOSIS — S60551A Superficial foreign body of right hand, initial encounter: Secondary | ICD-10-CM

## 2021-06-03 DIAGNOSIS — M25552 Pain in left hip: Secondary | ICD-10-CM

## 2021-06-03 DIAGNOSIS — I739 Peripheral vascular disease, unspecified: Secondary | ICD-10-CM | POA: Diagnosis not present

## 2021-06-03 DIAGNOSIS — M778 Other enthesopathies, not elsewhere classified: Secondary | ICD-10-CM | POA: Diagnosis not present

## 2021-06-03 DIAGNOSIS — M79661 Pain in right lower leg: Secondary | ICD-10-CM | POA: Diagnosis not present

## 2021-06-03 DIAGNOSIS — B20 Human immunodeficiency virus [HIV] disease: Secondary | ICD-10-CM

## 2021-06-03 DIAGNOSIS — I10 Essential (primary) hypertension: Secondary | ICD-10-CM | POA: Diagnosis not present

## 2021-06-03 MED ORDER — GABAPENTIN 300 MG PO CAPS: 300.0000 mg | ORAL_CAPSULE | Freq: Every day | ORAL | 3 refills | Status: DC

## 2021-06-03 NOTE — Progress Notes (Signed)
? ?This is a Careers information officer Note.  The care of the patient was discussed with Dr. Gilles Chiquito, MD and the assessment and plan was formulated with their assistance.  Please see their note for official documentation of the patient encounter.  ? ?Subjective:  ? ?Patient ID: Jessica [name] female   DOB: Aug 18, 1940 81 y.o.   MRN: [id number] ? ?HPI: ?Jessica [name] is a 81 y.o. with PMHx of HIV, HTN, PVD who presents with L hip pain, burning R lower leg pain, and L foot tightness. The hip pain has become increasingly bothersome over the past month. She says she cannot sleep well and feels an aching throbbing pain that makes her not want to sit still. She says she had a pain of similar intensity in her hip in October 2020, after a fall. She also reports a surgery in May 2022 that she had because of a blood clot and wonders if this contributes to her pain. She takes ibuprofen, 8hr Arthritis Tylenol, and Tramadol, and she uses diclofenac gel, and these provide little relief. She has also had constant, burning pain in her R leg from her lateral mid thigh down to her ankle. The pain is localized to the lateral side, and she describes the pain as "feeling like I have been sitting close to a stove for a long time". She says she feels a tightness along the medial aspect of the plantar surface of her left foot. The pain is constant, and she said it leads into her big toe and makes her feel like there is a hole in her sock.  ? ?She has otherwise been doing well. She says she went into the woods near her home and got lost and now has a few splinters in her R hand from grabbing branches attempting to keep her balance. She denies falls. She has reduced her smoking and is currently smoking 1/2 pack per day with interest in quitting. She receives her medications in packets and is taking them as prescribed.  ? ? ?Past Medical History:  ?Diagnosis Date  ? Acute blood loss anemia 08/08/2020  ? Acute renal failure (ARF)  ([place]) 08/17/2017  ? Anxiety 04/05/2012  ? Bilateral sensorineural hearing loss 06/11/2014  ? Mild to moderate on the left side and slight to mild on the right side per audiometry 05/2014.  Hearing aides with possible masking of tinnitus recommended but patient wished to defer secondary to finances.  ? Blood transfusion without reported diagnosis   ? pt denies  ? Bursitis of right shoulder 07/12/2012  ? s/p shoulder injection 07/12/2012   ? Cataract of right eye   ? REMOVED RIGHT EYE 4-19   ? Constipation due to pain medication 04/27/2010  ? Diverticulosis 02/08/2012  ? Extensive left-sided diverticula on colonoscopy March 2012 per Dr. Gala Romney   ? Essential hypertension 07/20/2006  ? Genital herpes 07/20/2006  ? 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 Headache 10/20/2019  ? Heart murmur 1961  ? Human immunodeficiency virus disease ([place]) 03/27/1986  ? Hyperlipidemia LDL goal < 100 04/05/2012  ? Hypokalemia 04/12/2018  ? Insomnia 10/20/2019  ? Left hip pain 04/11/2021  ? Leg pain 04/11/2021  ? Long-term current use of opiate analgesic 03/17/2016  ? Loose stools 04/08/2019  ? Lumbar degenerative disc disease 07/20/2006  ? With chronic back pain   ? Marijuana use 07/03/2016  ? Micturition syncope 09/20/2015  ? Nausea and vomiting 04/08/2019  ? Opiate dependence ([place]) 04/12/2018  ? Peripheral  vascular occlusive disease ([place]) 11/01/2011  ? s/p aortobifem bypass 2009   ? Periumbilical hernia 11/20/3714  ? 1 cm left periumbilical abdominal wall defect  ? 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 DEXA 04/15/2012: L1-L4 spine T -3.9, Right femur T -3.0   ? Right rotator cuff tear 02/01/2013  ? Responds to periodic steroid injections  ? Seborrhea 09/01/2010  ? Shoulder pain, right 12/04/2017  ? Small bowel obstruction due to adhesions ([place]) 02/08/2012  ? s/p Exploratory laparotomy, lysis of adhesions 02/12/12    ? Subjective tinnitus of both ears 05/18/2014  ? Tobacco abuse 02/19/2012  ? Tobacco abuse   ? Vasovagal syncope 02/15/2015  ? Vitamin D deficiency 05/29/2018  ? Vitamin D  18.96 (04/30/2018), treated with ergocalciferol 50,000 units PO QWk X 4 weeks  ? Voiding dysfunction   ? s/p cystoscopy and meatal dilation Dec 2005  ? ?Current Outpatient Medications  ?Medication Sig Dispense Refill  ? acyclovir (ZOVIRAX) 400 MG tablet TAKE 1 TABLET BY MOUTH THREE TIMES DAILY AS NEEDED FOR OUTBREAKS FOR SEVEN DAYS 21 tablet 5  ? gabapentin (NEURONTIN) 300 MG capsule Take 1 capsule (300 mg total) by mouth at bedtime. 90 capsule 3  ? alendronate (FOSAMAX) 70 MG tablet Take 1 tablet (70 mg total) by mouth every Sunday. Take with a full glass of water on an empty stomach. 12 tablet 3  ? ALPRAZolam (XANAX) 0.5 MG tablet TAKE 1 OR 2 TABLETS BY MOUTH AT BEDTIME AS NEEDED FOR ANXIETY OR SLEEP 60 tablet 1  ? amLODipine (NORVASC) 10 MG tablet TAKE 1 TABLET BY MOUTH DAILY 30 tablet 3  ? atorvastatin (LIPITOR) 10 MG tablet Take 1 tablet (10 mg total) by mouth at bedtime. 90 tablet 3  ? atropine 1 % ophthalmic solution Apply to eye.    ? benazepril (LOTENSIN) 40 MG tablet Take 1 tablet (40 mg total) by mouth daily. 90 tablet 3  ? bictegravir-emtricitabine-tenofovir AF (BIKTARVY) 50-200-25 MG TABS tablet Take 1 tablet by mouth daily. 30 tablet 11  ? brimonidine (ALPHAGAN) 0.2 % ophthalmic solution USE 1 DROP IN [facility] EYE TWICE DAILY    ? clopidogrel (PLAVIX) 75 MG tablet TAKE 1 TABLET BY MOUTH DAILY 30 tablet 3  ? COMBIGAN 0.2-0.5 % ophthalmic solution Place 1 drop into the left eye 2 (two) times daily.     ? diclofenac Sodium (VOLTAREN) 1 % GEL Apply 2 g topically 4 (four) times daily as needed (hip/shoulder pain). 350 g 2  ? dorzolamide (TRUSOPT) 2 % ophthalmic solution Place 1 drop into both eyes 2 (two) times daily.  5  ? dorzolamide-timolol (COSOPT) 22.3-6.8 MG/ML ophthalmic solution INSTILL ONE DROP IN [facility] EYE TWICE DAILY    ? latanoprost (XALATAN) 0.005 % ophthalmic solution Administer 1 drop to both eyes nightly.    ? Multiple Vitamin (MULTIVITAMIN WITH MINERALS) TABS Take 1 tablet by mouth daily.    ?  NURTEC 75 MG TBDP DISSOLVE ONE TABLET ON OR UNDER THE TONGUE EVERY OTHER DAY 45 tablet 3  ? ondansetron (ZOFRAN-ODT) 4 MG disintegrating tablet Take 4 mg by mouth every 6 (six) hours as needed for nausea or vomiting.     ? prednisoLONE acetate (PRED FORTE) 1 % ophthalmic suspension Place 1 drop into the right eye 3 (three) times daily.    ? terazosin (HYTRIN) 5 MG capsule Take 1 capsule (5 mg total) by mouth at bedtime. 90 capsule 3  ? triamcinolone ointment (KENALOG) 0.1 % APPLY TO THE AFFECTED AREA(S) TWICE DAILY (Patient taking  differently: Apply 1 application topically 2 (two) times daily as needed (rash).) 454 g 3  ? ?No current facility-administered medications for this visit.  ? ?Family History  ?Problem Relation Age of Onset  ? Kidney failure Mother   ? Diabetes Mother   ? Hypertension Mother   ? Heart disease Mother   ? Glaucoma Father   ? Congestive Heart Failure Sister   ? Diabetes Sister   ? Kidney disease Sister   ? Diabetes Brother   ? Unexplained death Brother 58  ?     Automobile accident  ? Hypothyroidism Daughter   ? Arthritis Daughter   ?     Neck/Back  ? Healthy Son   ? HIV/AIDS Brother   ? HIV Daughter   ? Kidney disease Daughter   ? Arthritis Son   ?     Knee  ? Colon cancer Neg Hx   ? Colon polyps Neg Hx   ? Esophageal cancer Neg Hx   ? Rectal cancer Neg Hx   ? Stomach cancer Neg Hx   ? ?Social History  ? ?Socioeconomic History  ? Marital status: Widowed  ?  Spouse name: Not on file  ? Number of children: 4  ? Years of education: 2y college  ? Highest education level: Not on file  ?Occupational History  ? Occupation: retired  ?  Comment: previously worked as a Designer, fashion/clothing for [employer]   ?Tobacco Use  ? Smoking status: Every Day  ?  Packs/day: 0.50  ?  Years: 50.00  ?  Pack years: 25.00  ?  Types: Cigarettes  ? Smokeless tobacco: Never  ?Vaping Use  ? Vaping Use: Never used  ?Substance and Sexual Activity  ? Alcohol use: No  ?  Alcohol/week: 0.0 standard drinks  ?  Comment: "last drink of  alcohol ~ 1977"  ? Drug use: No  ?  Comment: cutting back on chantix, 1 cigarette this morning  ? Sexual activity: Never  ?Other Topics Concern  ? Not on file  ?Social History Narrative  ? Lives alone in [place]

## 2021-06-03 NOTE — Assessment & Plan Note (Signed)
Patient has L hip pain has become increasingly bothersome over the past month. She says she cannot sleep well and feels an aching throbbing pain that makes her not want to sit still. She says she had a pain of similar intensity in her hip in October 2020, after a fall. She also reports a surgery in May 2022 that she had because of a blood clot and wonders if this contributes to her pain. She takes ibuprofen, 8hr Arthritis Tylenol, and Tramadol, and she uses diclofenac gel, and these provide little relief. X-ray from 04/11/2021 showed evidence of stable left hip osteoarthritis with no acute bony abnormality. Results were communicated to the patient. Placed a referral for herto see a sports medicine doctor, who may be able to give her a hip injection. Ordering Oxycodone with Tylenol for her to take once daily as needed for pain. ? ?Plan ?- Oxycodone with Tylenol for pain as needed ?- Evaluation by sports medicine ?- Follow up in 3 months ?

## 2021-06-03 NOTE — Assessment & Plan Note (Signed)
Patient denies pain with palpation of both lower extremities. Symmetric DP and TP bilaterally. Results of ABI study from 04/29/21 discussed with patient. Patient has severe LLE disease with abnormal L toe brachial index. RLE has mild disease with normal R toe brachial index. Significant decline in LLE bloodflow based on studies. ABIs of left foot and toe brachial index wnl in 08/2020. ? ?Plan ?- Continue ASA and atorvastatin ?- Follow up with vascular surgery as indicated ?

## 2021-06-03 NOTE — Assessment & Plan Note (Addendum)
Patient reports good adherence to [facility]. Last labs 04/11/2021 were wnl. No changes at this time. Will continue to monitor. ?

## 2021-06-03 NOTE — Assessment & Plan Note (Addendum)
Patient's blood pressure elevated slightly 158/65. BP checked after the patient was roomed and was not rechecked today. She reports she has been taking her medications appropriately. No changes at this time. Will continue Benazepril '40mg'$  daily, Terazosin '5mg'$  daily, and Amlodipine '10mg'$  daily and will continue to monitor.  ?

## 2021-06-03 NOTE — Patient Instructions (Addendum)
It was very nice to see you in clinic today! Here are a few reminders from what we discussed: ? ?We are starting 2 new medications! Take Gabapentin '300mg'$  at night for the burning pain. Take Oxycodone with Tylenol once daily as needed for pain in your hip. ?We will place a referral for you to see a sports medicine doctor. ?We recommend you perform stretches for the pain in your right foot. You can roll your foot on a frozen water bottle or tennis ball, and this should help. ?We are proud of you for cutting back on smoking! We are here to support you however we can as you consider trying to quit. ? ?We would like for you to follow up in 3 months! If you need anything before then, please feel free to call us! ? ? ? ?Extensor Tendinitis ? ?Follow these instructions at home: ?Managing pain, stiffness, and swelling ? ?If directed, put ice on the painful area. To do this: ?Put ice in a plastic bag, or use a frozen bottle of water. ?Place a towel between your skin and the bag or bottle. ?Roll the bottom of your foot over the bag or bottle. ?Do this for 20 minutes, 2-3 times a day. ?Wear athletic shoes that have air-sole or gel-sole cushions, or try soft shoe inserts that are designed for plantar fasciitis. ?Elevate your foot above the level of your heart while you are sitting or lying down. ?

## 2021-06-03 NOTE — Assessment & Plan Note (Signed)
Patient says she continues smoking but expressed interest in attempting to quit. She says she has smoked since she was 81 years old and enjoys it "very much". She has reduced her smoking and is currently smoking 1/2 pack per day. She says Chantix has not worked for her in the past. Patient's daughter smokes too, and her daughter says they will not be able to quit. Patient expressed interest in using patches. Explained that the patch dosing is for patients who have determined a quit date. She is only considering quitting at this time, so will reassess readiness at next visit. ?

## 2021-06-03 NOTE — Assessment & Plan Note (Signed)
Patient describes constant, burning pain in her R leg from her lateral mid thigh down to her ankle. The pain is localized to the lateral side, and she describes the pain as "feeling like I have been sitting close to a stove for a long time". Ordered Gabapentin and will see if this helps. ? ?Plan ?- Gabapentin '300mg'$  at bedtime ?

## 2021-06-03 NOTE — Assessment & Plan Note (Signed)
Patient says she went into the woods near her home and got last and now has a few splinters in her R hand from grabbing branches attempting to keep her balance. On exam, did palpate splinter in thenar area. No evidence of infection. She denies pain but says it is bothersome. Told patient that it would not require removal at this time and that she should expect thi s to self-resolve over next few weeks. ?

## 2021-06-03 NOTE — Assessment & Plan Note (Signed)
Patient feels a tightness along the medial aspect of the plantar surface of her left foot. The pain is constant, and she said it leads into her big toe and makes her feel like there is a hole in her sock. On exam, area along surface of her foot that was very firm to palpation. When extending the great toe, the area moved and there was associated pain, indicating likely tendon involvement. Recommended patient perform stretching exercises and use a tennis ball or frozen water bottle to roll under the surface of the foot. This should relieve some of the tightness she describes, but will reevaluate at next visit in 6 months. ? ?Plan ?- Foot extensor tendon stretching exercises daily ?

## 2021-06-06 ENCOUNTER — Other Ambulatory Visit: Payer: Self-pay | Admitting: Internal Medicine

## 2021-06-06 ENCOUNTER — Telehealth: Payer: Self-pay | Admitting: Internal Medicine

## 2021-06-06 DIAGNOSIS — I10 Essential (primary) hypertension: Secondary | ICD-10-CM

## 2021-06-06 MED ORDER — BENAZEPRIL HCL 40 MG PO TABS: 40.0000 mg | ORAL_TABLET | Freq: Every day | ORAL | 3 refills | Status: DC

## 2021-06-06 MED ORDER — OXYCODONE-ACETAMINOPHEN 5-325 MG PO TABS: 1.0000 | ORAL_TABLET | Freq: Three times a day (TID) | ORAL | 0 refills | Status: DC | PRN

## 2021-06-06 NOTE — Telephone Encounter (Signed)
Pt's daughter requesting a refill on a a pain medication.  Please call back. ?

## 2021-06-06 NOTE — Telephone Encounter (Signed)
I could not get [employer] to work on Friday.  Sent in now! ? ?Thanks! ?

## 2021-06-06 NOTE — Progress Notes (Signed)
Attestation for Student Documentation: ? ?I personally was present and performed or re-performed the history, physical exam and medical decision-making activities of this service and have verified that the service and findings are accurately documented in the student?s note. ? ?Ms. Manns had a few MSK complaints today.  She notes hip pain and would like to try injectable therapy.  We will send a referral to Sports Medicine for evaluation of possible injectable therapy.   She requested something "stronger" for the pain while she waits for this appointment.  Reminded her not to take tramadol and the oxycodone together.  Her foot pain appears to be a tendonitis of the flexor tendon of the great toe, she will try voltaren gel and stretching to see if this helps.  ? ?We discussed smoking cessation at length today.  Given her long time smoking, will discuss lung cancer screening at next visit.  ? ?Sid Falcon, MD ?06/06/2021, 9:24 AM ? ?

## 2021-06-06 NOTE — Telephone Encounter (Signed)
Returned call to patient's daughter. States they received refill on gabapentin but not on oxycodone. States this was discussed at last OV on 3/24 for hip pain following fall. Would like the Rx to go to [facility]. ?

## 2021-06-13 ENCOUNTER — Ambulatory Visit (HOSPITAL_COMMUNITY)
Admission: RE | Admit: 2021-06-13 | Discharge: 2021-06-13 | Disposition: A | Discharge status: Home / Self Care | Payer: Medicare Other | Source: Ambulatory Visit | Attending: Vascular Surgery | Admitting: Vascular Surgery

## 2021-06-13 ENCOUNTER — Other Ambulatory Visit: Payer: Self-pay | Admitting: Internal Medicine

## 2021-06-13 ENCOUNTER — Ambulatory Visit: Payer: Medicare Other | Admitting: Physician Assistant

## 2021-06-13 VITALS — BP 129/62 | HR 49 | Temp 97.8°F | Resp 20 | Ht 67.0 in | Wt 120.2 lb

## 2021-06-13 DIAGNOSIS — I739 Peripheral vascular disease, unspecified: Secondary | ICD-10-CM

## 2021-06-13 NOTE — Progress Notes (Signed)
[facility] [place] ?HISTORY AND PHYSICAL  ? ?History of Present Illness:  Patient is a 81 y.o. year old female who presents for evaluation of claudication.  S/P original aortobifemoral bypass graft 2009 by Dr. Kellie Simmering.  She is s/p s/p redo left femoral artery exposure and thrombectomy of left limb of aortobifemoral bypass graft, profunda and SFA on 07/11/2020 by Dr. Trula Slade.  This was performed secondary to ischemic left leg with findings of acute thrombosis of the left limb of the aortobifemoral graft.  The right iliac limb of the graft was widely patent as well as the right common femoral artery.  There were mild areas of narrowing involving the SFA and popliteal artery without evidence for high-grade stenosis or complete occlusion.  Three-vessel runoff was noted to the right ankle. ? She describes burning pain in B LE.  She does not have classic symptoms of claudication, rest pain or non healing wounds.  She is able to walk through stores without calf pain.   ?  ?  She is medically managed on Lipitor and Plavix daily. ? ?Past Medical History:  ?Diagnosis Date  ? Acute blood loss anemia 08/08/2020  ? Acute renal failure (ARF) ([place]) 08/17/2017  ? Anxiety 04/05/2012  ? Bilateral sensorineural hearing loss 06/11/2014  ? Mild to moderate on the left side and slight to mild on the right side per audiometry 05/2014.  Hearing aides with possible masking of tinnitus recommended but patient wished to defer secondary to finances.  ? Blood transfusion without reported diagnosis   ? pt denies  ? Bursitis of right shoulder 07/12/2012  ? s/p shoulder injection 07/12/2012   ? Cataract of right eye   ? REMOVED RIGHT EYE 4-19   ? Constipation due to pain medication 04/27/2010  ? Diverticulosis 02/08/2012  ? Extensive left-sided diverticula on colonoscopy March 2012 per Dr. Gala Romney   ? Essential hypertension 07/20/2006  ? Genital herpes 07/20/2006  ? 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 Headache 10/20/2019  ? Heart murmur 1961  ?  Human immunodeficiency virus disease ([place]) 03/27/1986  ? Hyperlipidemia LDL goal < 100 04/05/2012  ? Hypokalemia 04/12/2018  ? Insomnia 10/20/2019  ? Left hip pain 04/11/2021  ? Leg pain 04/11/2021  ? Long-term current use of opiate analgesic 03/17/2016  ? Loose stools 04/08/2019  ? Lumbar degenerative disc disease 07/20/2006  ? With chronic back pain   ? Marijuana use 07/03/2016  ? Micturition syncope 09/20/2015  ? Nausea and vomiting 04/08/2019  ? Opiate dependence ([place]) 04/12/2018  ? 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[place]) 11/01/2011  ? s/p aortobifem bypass 2009   ? Periumbilical hernia 04/13/3084  ? 1 cm left periumbilical abdominal wall defect  ? 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 DEXA 04/15/2012: L1-L4 spine T -3.9, Right femur T -3.0   ? Right rotator cuff tear 02/01/2013  ? Responds to periodic steroid injections  ? Seborrhea 09/01/2010  ? Shoulder pain, right 12/04/2017  ? Small bowel obstruction due to adhesions ([place]) 02/08/2012  ? s/p Exploratory laparotomy, lysis of adhesions 02/12/12    ? Subjective tinnitus of both ears 05/18/2014  ? Tobacco abuse 02/19/2012  ? Tobacco abuse   ? Vasovagal syncope 02/15/2015  ? Vitamin D deficiency 05/29/2018  ? Vitamin D 18.96 (04/30/2018), treated with ergocalciferol 50,000 units PO QWk X 4 weeks  ? Voiding dysfunction   ? s/p cystoscopy and meatal dilation Dec 2005  ? ? ?Past Surgical History:  ?Procedure Laterality Date  ? ABDOMINAL HYSTERECTOMY    ?  AORTO-FEMORAL BYPASS GRAFT  04/2007  ? APPENDECTOMY    ? BREAST SURGERY    ? Breast biopsy: negative  ? CHOLECYSTECTOMY    ? COLECTOMY  01/2011  ? Dr. Margot Chimes; "took out 12 inches of small intestiines and removed blockage"  ? COLONOSCOPY  2012  ? EYE SURGERY    ? EYE SURGERY  06/29/2020  ? 06-29-2020- RIGHT CATARACT REMOVED AND LEFT EYE SURGERY TO REMOVE SAND LIKE SUBSTANCE   ? FEMORAL ARTERY EXPLORATION Left 07/10/2020  ? Procedure: REDO LEFT FEMORAL ARTERY EXPOSURE;  Surgeon: Serafina Mitchell, MD;  Location: [facility];  Service: Vascular;   Laterality: Left;  ? LAPAROTOMY  02/12/2012  ? Procedure: EXPLORATORY LAPAROTOMY;  Surgeon: Stark Klein, MD;  Location: [place];  Service: General;  Laterality: N/A;  Exploratory Laparotomy, lysis of adhesions  ? SMALL INTESTINE SURGERY    ? THROMBECTOMY FEMORAL ARTERY Left 07/10/2020  ? Procedure: THROMBECTOMY AORTA-BIFEMORAL GRAFT, PROFUNDA, AND SUPERFICIAL FEMORAL ARTERY  LEFT LEG;  Surgeon: Serafina Mitchell, MD;  Location: [place];  Service: Vascular;  Laterality: Left;  ? ? ?ROS:  ? ?General:  No weight loss, Fever, chills ? ?HEENT: No recent headaches, no nasal bleeding, no visual changes, no sore throat ? ?Neurologic: No dizziness, blackouts, seizures. No recent symptoms of stroke or mini- stroke. No recent episodes of slurred speech, or temporary blindness. ? ?Cardiac: No recent episodes of chest pain/pressure, no shortness of breath at rest.  No shortness of breath with exertion.  Denies history of atrial fibrillation or irregular heartbeat ? ?Vascular: No history of rest pain in feet.  No history of claudication.  No history of non-healing ulcer, No history of DVT  ? ?Pulmonary: No home oxygen, no productive cough, no hemoptysis,  No asthma or wheezing ? ?Musculoskeletal:  '[ ]'$  Arthritis, '[ ]'$  Low back pain,  '[ ]'$  Joint pain ? ?Hematologic:No history of hypercoagulable state.  No history of easy bleeding.  No history of anemia ? ?Gastrointestinal: No hematochezia or melena,  No gastroesophageal reflux, no trouble swallowing ? ?Urinary: '[ ]'$  chronic Kidney disease, '[ ]'$  on HD - '[ ]'$  MWF or '[ ]'$  TTHS, '[ ]'$  Burning with urination, '[ ]'$  Frequent urination, '[ ]'$  Difficulty urinating;  ? ?Skin: No rashes ? ?Psychological: No history of anxiety,  No history of depression ? ?Social History ?Social History  ? ?Tobacco Use  ? Smoking status: Every Day  ?  Packs/day: 0.50  ?  Years: 50.00  ?  Pack years: 25.00  ?  Types: Cigarettes  ? Smokeless tobacco: Never  ?Vaping Use  ? Vaping Use: Never used  ?Substance Use Topics  ? Alcohol  use: No  ?  Alcohol/week: 0.0 standard drinks  ?  Comment: "last drink of alcohol ~ 1977"  ? Drug use: No  ?  Comment: cutting back on chantix, 1 cigarette this morning  ? ? ?Family History ?Family History  ?Problem Relation Age of Onset  ? Kidney failure Mother   ? Diabetes Mother   ? Hypertension Mother   ? Heart disease Mother   ? Glaucoma Father   ? Congestive Heart Failure Sister   ? Diabetes Sister   ? Kidney disease Sister   ? Diabetes Brother   ? Unexplained death Brother 38  ?     Automobile accident  ? Hypothyroidism Daughter   ? Arthritis Daughter   ?     Neck/Back  ? Healthy Son   ? HIV/AIDS Brother   ? HIV  Daughter   ? Kidney disease Daughter   ? Arthritis Son   ?     Knee  ? Colon cancer Neg Hx   ? Colon polyps Neg Hx   ? Esophageal cancer Neg Hx   ? Rectal cancer Neg Hx   ? Stomach cancer Neg Hx   ? ? ?Allergies ? ?Allergies  ?Allergen Reactions  ? Hctz [Hydrochlorothiazide] Other (See Comments)  ?  Dizziness, syncope; does NOT wish to take anymore  ? ? ? ?Current Outpatient Medications  ?Medication Sig Dispense Refill  ? acyclovir (ZOVIRAX) 400 MG tablet TAKE 1 TABLET BY MOUTH THREE TIMES DAILY AS NEEDED FOR OUTBREAKS FOR SEVEN DAYS 21 tablet 5  ? alendronate (FOSAMAX) 70 MG tablet Take 1 tablet (70 mg total) by mouth every Sunday. Take with a full glass of water on an empty stomach. 12 tablet 3  ? ALPRAZolam (XANAX) 0.5 MG tablet TAKE 1 OR 2 TABLETS BY MOUTH AT BEDTIME AS NEEDED FOR ANXIETY OR SLEEP 60 tablet 1  ? amLODipine (NORVASC) 10 MG tablet TAKE 1 TABLET BY MOUTH DAILY 30 tablet 3  ? atorvastatin (LIPITOR) 10 MG tablet Take 1 tablet (10 mg total) by mouth at bedtime. 90 tablet 3  ? atropine 1 % ophthalmic solution Apply to eye.    ? benazepril (LOTENSIN) 40 MG tablet Take 1 tablet (40 mg total) by mouth daily. 90 tablet 3  ? bictegravir-emtricitabine-tenofovir AF (BIKTARVY) 50-200-25 MG TABS tablet Take 1 tablet by mouth daily. 30 tablet 11  ? brimonidine (ALPHAGAN) 0.2 % ophthalmic solution  USE 1 DROP IN [facility] EYE TWICE DAILY    ? clopidogrel (PLAVIX) 75 MG tablet TAKE 1 TABLET BY MOUTH DAILY 30 tablet 3  ? COMBIGAN 0.2-0.5 % ophthalmic solution Place 1 drop into the left eye 2 (two) times daily.

## 2021-06-13 NOTE — Telephone Encounter (Signed)
Approved 1x refill of percocet. Can we follow up on her sports medicine referral? Thanks. ?

## 2021-06-13 NOTE — Telephone Encounter (Signed)
Last Appt 06/03/2021.  No UA [employer].  Next appt 08/26/2021 ?

## 2021-06-21 ENCOUNTER — Other Ambulatory Visit: Payer: Self-pay | Admitting: Internal Medicine

## 2021-06-21 DIAGNOSIS — F411 Generalized anxiety disorder: Secondary | ICD-10-CM

## 2021-06-22 ENCOUNTER — Other Ambulatory Visit: Payer: Self-pay | Admitting: *Deleted

## 2021-06-22 DIAGNOSIS — I743 Embolism and thrombosis of arteries of the lower extremities: Secondary | ICD-10-CM

## 2021-06-22 DIAGNOSIS — T82868D Thrombosis of vascular prosthetic devices, implants and grafts, subsequent encounter: Secondary | ICD-10-CM

## 2021-06-22 DIAGNOSIS — I739 Peripheral vascular disease, unspecified: Secondary | ICD-10-CM

## 2021-06-27 ENCOUNTER — Other Ambulatory Visit: Payer: Self-pay | Admitting: *Deleted

## 2021-06-27 MED ORDER — OXYCODONE-ACETAMINOPHEN 5-325 MG PO TABS: ORAL_TABLET | ORAL | 0 refills | Status: DC

## 2021-06-27 NOTE — Telephone Encounter (Signed)
Further refills will need appointment.  ?

## 2021-06-27 NOTE — Telephone Encounter (Signed)
Received call from patient requesting refill on oxycodone for left hip pain. Patient was given 20 tabs on 4/3. ?

## 2021-07-05 ENCOUNTER — Other Ambulatory Visit: Payer: Self-pay | Admitting: Internal Medicine

## 2021-07-05 NOTE — Telephone Encounter (Signed)
Next appointment 08/26/2021. No [facility] ?

## 2021-07-05 NOTE — Telephone Encounter (Signed)
She needs to come in for further refills.  Please have her schedule with a resident for follow up ?

## 2021-07-05 NOTE — Telephone Encounter (Signed)
Needs appointment

## 2021-07-06 ENCOUNTER — Other Ambulatory Visit: Payer: Self-pay | Admitting: Internal Medicine

## 2021-07-12 NOTE — Telephone Encounter (Signed)
Just spoke with patient's daughter and agreed to an appointment for next Tuesday 07/19/2021 at 9:45 am with Dr. Ileene Musa. ?

## 2021-07-19 ENCOUNTER — Encounter: Payer: Medicare Other | Admitting: Internal Medicine

## 2021-07-21 ENCOUNTER — Encounter: Payer: Self-pay | Admitting: Internal Medicine

## 2021-07-21 ENCOUNTER — Ambulatory Visit (INDEPENDENT_AMBULATORY_CARE_PROVIDER_SITE_OTHER): Payer: Medicare Other | Admitting: Internal Medicine

## 2021-07-21 ENCOUNTER — Other Ambulatory Visit: Payer: Self-pay

## 2021-07-21 VITALS — BP 121/45 | HR 66 | Temp 98.5°F | Resp 28 | Ht 67.0 in | Wt 115.4 lb

## 2021-07-21 DIAGNOSIS — Z0000 Encounter for general adult medical examination without abnormal findings: Secondary | ICD-10-CM

## 2021-07-21 DIAGNOSIS — I10 Essential (primary) hypertension: Secondary | ICD-10-CM

## 2021-07-21 DIAGNOSIS — M1612 Unilateral primary osteoarthritis, left hip: Secondary | ICD-10-CM | POA: Diagnosis not present

## 2021-07-21 DIAGNOSIS — F17200 Nicotine dependence, unspecified, uncomplicated: Secondary | ICD-10-CM

## 2021-07-21 DIAGNOSIS — M79676 Pain in unspecified toe(s): Secondary | ICD-10-CM | POA: Insufficient documentation

## 2021-07-21 DIAGNOSIS — F17210 Nicotine dependence, cigarettes, uncomplicated: Secondary | ICD-10-CM | POA: Diagnosis not present

## 2021-07-21 DIAGNOSIS — M79675 Pain in left toe(s): Secondary | ICD-10-CM

## 2021-07-21 MED ORDER — TERAZOSIN HCL 5 MG PO CAPS: 5.0000 mg | ORAL_CAPSULE | Freq: Every day | ORAL | 3 refills | Status: DC

## 2021-07-21 NOTE — Patient Instructions (Addendum)
[facility] ? ?It was a pleasure seeing you in the clinic today.  ? ?We talked about your hip pain, smoking, and mammograms. ? ?Hip pain- You should go to your sports medicine appointment on the 16th. Continue using your voltaren gel and the tylenol 1000 mg three times a day. On really bad days you can take an ibuprofen but do not do this every day. ? ?Mammogram- they should call you to schedule an appointment ? ?Please call our clinic at [phone] if you have any questions or concerns. The best time to call is Monday-Friday from 9am-4pm, but there is someone available 24/7 at the same number. If you need medication refills, please notify your pharmacy one week in advance and they will send [name] a request. ?  ?Thank you for letting us take part in your care. We look forward to seeing you next time! ? ?

## 2021-07-21 NOTE — Assessment & Plan Note (Signed)
Patient has not yet seen sports medicine, has appointment coming up in 5 days. Recommended that she continue to schedule tylenol and use voltaren gel until she sees SM. Pain has not changed since her last visit and she is not have any radiculopathy symptoms. She was prescribed a few prescriptions of opioids for the pain but I would like to hold off on continuing these prescriptions to see if she is able to get relief through non-opioid therapy with SM. ?

## 2021-07-21 NOTE — Assessment & Plan Note (Signed)
Mammogram referral placed.

## 2021-07-21 NOTE — Assessment & Plan Note (Signed)
Patient complaining of toe pain on the left. No lesions to explain pain, patient may have some crowding of her toes and a mild hammertoe that could be causing some pain. Recommended she schedule another appointment with podiatry ?

## 2021-07-21 NOTE — Progress Notes (Signed)
? ?  CC: hip pain ? ?HPI: ? ?[name] is a 81 y.o. PMH noted below, who presents to the [facility] with complaints of hip pain. To see the management of his acute and chronic conditions, please refer to the A&P note under the encounters tab.  ? ?Past Medical History:  ?Diagnosis Date  ? Acute blood loss anemia 08/08/2020  ? Acute renal failure (ARF) ([place]) 08/17/2017  ? Anxiety 04/05/2012  ? Bilateral sensorineural hearing loss 06/11/2014  ? Mild to moderate on the left side and slight to mild on the right side per audiometry 05/2014.  Hearing aides with possible masking of tinnitus recommended but patient wished to defer secondary to finances.  ? Blood transfusion without reported diagnosis   ? pt denies  ? Bursitis of right shoulder 07/12/2012  ? s/p shoulder injection 07/12/2012   ? Cataract of right eye   ? REMOVED RIGHT EYE 4-19   ? Constipation due to pain medication 04/27/2010  ? Diverticulosis 02/08/2012  ? Extensive left-sided diverticula on colonoscopy March 2012 per Dr. Gala Romney   ? Essential hypertension 07/20/2006  ? Genital herpes 07/20/2006  ? 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 Headache 10/20/2019  ? Heart murmur 1961  ? Human immunodeficiency virus disease ([place]) 03/27/1986  ? Hyperlipidemia LDL goal < 100 04/05/2012  ? Hypokalemia 04/12/2018  ? Insomnia 10/20/2019  ? Left hip pain 04/11/2021  ? Leg pain 04/11/2021  ? Long-term current use of opiate analgesic 03/17/2016  ? Loose stools 04/08/2019  ? Lumbar degenerative disc disease 07/20/2006  ? With chronic back pain   ? Marijuana use 07/03/2016  ? Micturition syncope 09/20/2015  ? Nausea and vomiting 04/08/2019  ? Opiate dependence ([place]) 04/12/2018  ? 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 s/p aortobifem bypass 2009   ? Periumbilical hernia 09/13/812  ? 1 cm left periumbilical abdominal wall defect  ? 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 DEXA 04/15/2012: L1-L4 spine T -3.9, Right femur T -3.0   ? Right rotator cuff tear 02/01/2013  ? Responds to periodic  steroid injections  ? Seborrhea 09/01/2010  ? Shoulder pain, right 12/04/2017  ? Small bowel obstruction due to adhesions ([place]) 02/08/2012  ? s/p Exploratory laparotomy, lysis of adhesions 02/12/12    ? Subjective tinnitus of both ears 05/18/2014  ? Tobacco abuse 02/19/2012  ? Tobacco abuse   ? Vasovagal syncope 02/15/2015  ? Vitamin D deficiency 05/29/2018  ? Vitamin D 18.96 (04/30/2018), treated with ergocalciferol 50,000 units PO QWk X 4 weeks  ? Voiding dysfunction   ? s/p cystoscopy and meatal dilation Dec 2005  ? ?Review of Systems:  positive for hip pain, negative for weakness, radiculopathy, paresthesias, or numbness ? ?Physical Exam: ?Gen: thin elderly woman in NAD ?HEENT: normocephalic atraumatic, MMM, visually impaired ?CV: RRR, no m/r/g   ?Resp: CTAB, normal WOB  ?GI: soft, nontender ?MSK: hip pain with ambulation ?Skin:warm and dry, no calluses, erythema, warmth, or lesions noted over her left foot. ?Neuro:alert answering questions appropriately ?Psych: normal affect ? ? ?Assessment & Plan:  ? ?See Encounters Tab for problem based charting. ? ?Patient discussed with Dr. Cain Sieve  ? ?

## 2021-07-21 NOTE — Assessment & Plan Note (Signed)
Terazosin refiled today. BP at goal, 121/45. Will continue to monitor that diastolic does not get too low ?

## 2021-07-21 NOTE — Assessment & Plan Note (Signed)
Not interested in quitting today. Has a daughter at bedside who also smokes and made comments that she is tired of being asked about quitting smoking herself so she is sure that her mother is too. ?- precontemplative, continue cessation counseling in the future ?

## 2021-07-26 ENCOUNTER — Ambulatory Visit (INDEPENDENT_AMBULATORY_CARE_PROVIDER_SITE_OTHER): Payer: Medicare Other | Admitting: Sports Medicine

## 2021-07-26 VITALS — BP 136/84 | Ht 67.0 in | Wt 119.0 lb

## 2021-07-26 DIAGNOSIS — M5416 Radiculopathy, lumbar region: Secondary | ICD-10-CM

## 2021-07-26 MED ORDER — METHYLPREDNISOLONE ACETATE 80 MG/ML IJ SUSP
80.0000 mg | Freq: Once | INTRAMUSCULAR | Status: AC
  Administered 2021-07-26: 80 mg via INTRAMUSCULAR

## 2021-07-26 MED ORDER — PREDNISONE 10 MG PO TABS: ORAL_TABLET | ORAL | 0 refills | Status: DC

## 2021-07-26 NOTE — Progress Notes (Signed)
? ?  Subjective:  ? ? Patient ID: Jessica [name], female    DOB: 10/05/1940, 81 y.o.   MRN: [id number] ? ?HPI chief complaint: Left hip pain ? ?Patient is a very pleasant 81 year old female that comes in complaining of 2 months of posterior left hip pain.  Pain is constantly present but seems to get worse with walking.  Her pain has recently started to radiate down the left leg into the medial aspect of the left lower leg and ankle.  She describes it as a sharp pain.  No numbness or tingling.  No groin pain.  No recent trauma.  Previous x-rays of the left hip showed some mild to moderate degenerative changes.  She is legally blind and is here today with a relative. ? ?Past medical history reviewed ?Medications reviewed ?Allergies reviewed ? ? ? ?Review of Systems ?As above ?   ?Objective:  ? Physical Exam ? ?Well-developed, well-nourished.  No acute distress.  Sitting comfortably in the exam room. ? ?Left hip: Smooth painless hip range of motion with a negative logroll.  No tenderness to palpation over the greater trochanter.  There is some tenderness to palpation along the area of the piriformis.  Equivocal straight leg raise on the left.  No focal strength deficit appreciated. ? ? ?   ?Assessment & Plan:  ? 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 ?80 mg of Depo-Medrol IM administered today.  She will start a 6-day Sterapred Dosepak tomorrow and take it to its completion.  Although her x-ray does show some arthritic change her symptoms or not consistent with osteoarthritis of the hip.  If symptoms persist after completion of the Dosepak then follow-up for reevaluation and further work-up.  Otherwise, follow-up as needed. ? ?This note was dictated using Dragon naturally speaking software and may contain errors in syntax, spelling, or content which have not been identified prior to signing this note.  ?

## 2021-07-26 NOTE — Addendum Note (Signed)
Addended by: Cyd Silence on: 07/26/2021 10:56 AM ? ? Modules accepted: Orders ? ?

## 2021-08-03 ENCOUNTER — Other Ambulatory Visit: Payer: Self-pay | Admitting: Internal Medicine

## 2021-08-03 DIAGNOSIS — I10 Essential (primary) hypertension: Secondary | ICD-10-CM

## 2021-08-04 NOTE — Progress Notes (Signed)
Internal Medicine Clinic Attending ° °Case discussed with Dr. DeMaio  At the time of the visit.  We reviewed the resident’s history and exam and pertinent patient test results.  I agree with the assessment, diagnosis, and plan of care documented in the resident’s note. ° ° °

## 2021-08-05 ENCOUNTER — Encounter: Payer: Medicare Other | Admitting: Internal Medicine

## 2021-08-09 DIAGNOSIS — H18231 Secondary corneal edema, right eye: Secondary | ICD-10-CM | POA: Diagnosis not present

## 2021-08-09 DIAGNOSIS — Z7982 Long term (current) use of aspirin: Secondary | ICD-10-CM | POA: Diagnosis not present

## 2021-08-09 DIAGNOSIS — N189 Chronic kidney disease, unspecified: Secondary | ICD-10-CM | POA: Diagnosis not present

## 2021-08-09 DIAGNOSIS — F17210 Nicotine dependence, cigarettes, uncomplicated: Secondary | ICD-10-CM | POA: Diagnosis not present

## 2021-08-09 DIAGNOSIS — Z79899 Other long term (current) drug therapy: Secondary | ICD-10-CM | POA: Diagnosis not present

## 2021-08-09 DIAGNOSIS — Z7902 Long term (current) use of antithrombotics/antiplatelets: Secondary | ICD-10-CM | POA: Diagnosis not present

## 2021-08-09 DIAGNOSIS — I739 Peripheral vascular disease, unspecified: Secondary | ICD-10-CM | POA: Diagnosis not present

## 2021-08-09 DIAGNOSIS — I129 Hypertensive chronic kidney disease with stage 1 through stage 4 chronic kidney disease, or unspecified chronic kidney disease: Secondary | ICD-10-CM | POA: Diagnosis not present

## 2021-08-19 ENCOUNTER — Other Ambulatory Visit: Payer: Self-pay | Admitting: *Deleted

## 2021-08-19 DIAGNOSIS — F411 Generalized anxiety disorder: Secondary | ICD-10-CM

## 2021-08-19 MED ORDER — ALPRAZOLAM 0.5 MG PO TABS: ORAL_TABLET | ORAL | 1 refills | Status: DC

## 2021-08-19 NOTE — Telephone Encounter (Signed)
Would you mind filling this for me?  I don't have a fingerprint thing up here

## 2021-08-26 ENCOUNTER — Encounter: Payer: Medicare Other | Admitting: Internal Medicine

## 2021-09-12 ENCOUNTER — Ambulatory Visit (INDEPENDENT_AMBULATORY_CARE_PROVIDER_SITE_OTHER): Payer: Medicare Other | Admitting: Physician Assistant

## 2021-09-12 ENCOUNTER — Ambulatory Visit (INDEPENDENT_AMBULATORY_CARE_PROVIDER_SITE_OTHER)
Admission: RE | Admit: 2021-09-12 | Discharge: 2021-09-12 | Disposition: A | Discharge status: Home / Self Care | Payer: Medicare Other | Source: Ambulatory Visit | Attending: Surgery | Admitting: Surgery

## 2021-09-12 ENCOUNTER — Other Ambulatory Visit: Payer: Self-pay

## 2021-09-12 ENCOUNTER — Ambulatory Visit (HOSPITAL_COMMUNITY)
Admission: RE | Admit: 2021-09-12 | Discharge: 2021-09-12 | Disposition: A | Discharge status: Home / Self Care | Payer: Medicare Other | Source: Ambulatory Visit | Attending: Surgery | Admitting: Surgery

## 2021-09-12 VITALS — BP 125/70 | HR 74 | Temp 97.2°F | Resp 16 | Ht 65.5 in | Wt 112.0 lb

## 2021-09-12 DIAGNOSIS — I739 Peripheral vascular disease, unspecified: Secondary | ICD-10-CM

## 2021-09-12 DIAGNOSIS — I743 Embolism and thrombosis of arteries of the lower extremities: Secondary | ICD-10-CM | POA: Insufficient documentation

## 2021-09-12 DIAGNOSIS — T82868D Thrombosis of vascular prosthetic devices, implants and grafts, subsequent encounter: Secondary | ICD-10-CM

## 2021-09-12 NOTE — Progress Notes (Signed)
Office Note     CC:  follow up Requesting Provider:  Sid Falcon, MD  HPI: Jessica [name] is a 81 y.o. (08/04/1940) female who presents to go over vascular studies related to PAD.  She has history of aortobifemoral bypass graft in 2009 by Dr. Kellie Simmering.  She underwent thrombectomy of left limb as well as thrombectomy of profunda and SFA on 07/11/2020 by Dr. Trula Slade.  Patient's chief complaint today is of a burning feeling in her right lateral lower leg.  She states this has been present for over a year and no one has been able to tell her the cause of this.  She states she has discussed this with her primary care provider on several occasions.  She denies any claudication, rest pain, or tissue loss of bilateral lower extremities.  She is legally blind.  She is accompanied by her daughter who lives in [place] however sees her every day.    Past Medical History:  Diagnosis Date   Acute blood loss anemia 08/08/2020   Acute renal failure (ARF) ([place]) 08/17/2017   Anxiety 04/05/2012   Bilateral sensorineural hearing loss 06/11/2014   Mild to moderate on the left side and slight to mild on the right side per audiometry 05/2014.  Hearing aides with possible masking of tinnitus recommended but patient wished to defer secondary to finances.   Blood transfusion without reported diagnosis    pt denies   Bursitis of right shoulder 07/12/2012   s/p shoulder injection 07/12/2012    Cataract of right eye    REMOVED RIGHT EYE 4-19    Constipation due to pain medication 04/27/2010   Diverticulosis 02/08/2012   Extensive left-sided diverticula on colonoscopy March 2012 per Dr. Gala Romney    Essential hypertension 07/20/2006   Genital herpes 07/20/2006   Glaucoma of left eye 07/20/2006   Headache 10/20/2019   Heart murmur 1961   Human immunodeficiency virus disease ([place]) 03/27/1986   Hyperlipidemia LDL goal < 100 04/05/2012   Hypokalemia 04/12/2018   Insomnia 10/20/2019   Left hip pain 04/11/2021   Leg pain 04/11/2021    Long-term current use of opiate analgesic 03/17/2016   Loose stools 04/08/2019   Lumbar degenerative disc disease 07/20/2006   With chronic back pain    Marijuana use 07/03/2016   Micturition syncope 09/20/2015   Nausea and vomiting 04/08/2019   Opiate dependence ([place]) 04/12/2018   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   s/p aortobifem bypass 0960    Periumbilical hernia 06/15/4096   1 cm left periumbilical abdominal wall defect   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   DEXA 04/15/2012: L1-L4 spine T -3.9, Right femur T -3.0    Right rotator cuff tear 02/01/2013   Responds to periodic steroid injections   Seborrhea 09/01/2010   Shoulder pain, right 12/04/2017   Small bowel obstruction due to adhesions ([place]) 02/08/2012   s/p Exploratory laparotomy, lysis of adhesions 02/12/12     Subjective tinnitus of both ears 05/18/2014   Tobacco abuse 02/19/2012   Tobacco abuse    Vasovagal syncope 02/15/2015   Vitamin D deficiency 05/29/2018   Vitamin D 18.96 (04/30/2018), treated with ergocalciferol 50,000 units PO QWk X 4 weeks   Voiding dysfunction    s/p cystoscopy and meatal dilation Dec 2005    Past Surgical History:  Procedure Laterality Date   ABDOMINAL HYSTERECTOMY     AORTO-FEMORAL BYPASS GRAFT  04/2007   APPENDECTOMY     BREAST SURGERY     Breast biopsy:  negative   CHOLECYSTECTOMY     COLECTOMY  01/2011   Dr. Margot Chimes; "took out 12 inches of small intestiines and removed blockage"   COLONOSCOPY  2012   EYE SURGERY     EYE SURGERY  06/29/2020   06-29-2020- RIGHT CATARACT REMOVED AND LEFT EYE SURGERY TO REMOVE SAND LIKE SUBSTANCE    FEMORAL ARTERY EXPLORATION Left 07/10/2020   Procedure: REDO LEFT FEMORAL ARTERY EXPOSURE;  Surgeon: Serafina Mitchell, MD;  Location: [facility];  Service: Vascular;  Laterality: Left;   LAPAROTOMY  02/12/2012   Procedure: EXPLORATORY LAPAROTOMY;  Surgeon: Stark Klein, MD;  Location: [facility];  Service: General;  Laterality: N/A;  Exploratory Laparotomy, lysis  of adhesions   SMALL INTESTINE SURGERY     THROMBECTOMY FEMORAL ARTERY Left 07/10/2020   Procedure: THROMBECTOMY AORTA-BIFEMORAL GRAFT, PROFUNDA, AND SUPERFICIAL FEMORAL ARTERY  LEFT LEG;  Surgeon: Serafina Mitchell, MD;  Location: [facility];  Service: Vascular;  Laterality: Left;    Social History   Socioeconomic History   Marital status: Widowed    Spouse name: Not on file   Number of children: 4   Years of education: 2y college   Highest education level: Not on file  Occupational History   Occupation: retired    Comment: previously worked as a Designer, fashion/clothing for [employer]   Tobacco Use   Smoking status: Every Day    Packs/day: 0.30    Years: 50.00    Total pack years: 15.00    Types: Cigarettes    Passive exposure: Current   Smokeless tobacco: Never   Tobacco comments:    1/3 pk per day  Vaping Use   Vaping Use: Never used  Substance and Sexual Activity   Alcohol use: No    Alcohol/week: 0.0 standard drinks of alcohol    Comment: "last drink of alcohol ~ 1977"   Drug use: No    Comment: cutting back on chantix, 1 cigarette this morning   Sexual activity: Never  Other Topics Concern   Not on file  Social History Narrative   Lives alone in [place], [place]   Social Determinants of Health   Financial Resource Strain: Not on file  Food Insecurity: Not on file  Transportation Needs: Not on file  Physical Activity: Not on file  Stress: Not on file  Social Connections: Not on file  Intimate Partner Violence: Not on file    Family History  Problem Relation Age of Onset   Kidney failure Mother    Diabetes Mother    Hypertension Mother    Heart disease Mother    Glaucoma Father    Congestive Heart Failure Sister    Diabetes Sister    Kidney disease Sister    Diabetes Brother    Unexplained death Brother 68       Automobile accident   Hypothyroidism Daughter    Arthritis Daughter        Neck/Back   Healthy Son    HIV/AIDS Brother    HIV Daughter    Kidney disease Daughter     Arthritis Son        Knee   Colon cancer Neg Hx    Colon polyps Neg Hx    Esophageal cancer Neg Hx    Rectal cancer Neg Hx    Stomach cancer Neg Hx     Current Outpatient Medications  Medication Sig Dispense Refill   acyclovir (ZOVIRAX) 400 MG tablet TAKE 1 TABLET BY MOUTH THREE TIMES DAILY AS NEEDED  FOR OUTBREAKS FOR SEVEN DAYS 21 tablet 5   alendronate (FOSAMAX) 70 MG tablet Take 1 tablet (70 mg total) by mouth every Sunday. Take with a full glass of water on an empty stomach. 12 tablet 3   ALPRAZolam (XANAX) 0.5 MG tablet TAKE 1 OR 2 TABLETS BY MOUTH AT BEDTIME AS NEEDED FOR ANXIETY OR SLEEP 60 tablet 1   amLODipine (NORVASC) 10 MG tablet TAKE 1 TABLET BY MOUTH DAILY 30 tablet 3   atorvastatin (LIPITOR) 10 MG tablet Take 1 tablet (10 mg total) by mouth at bedtime. 90 tablet 3   atropine 1 % ophthalmic solution Apply to eye.     benazepril (LOTENSIN) 40 MG tablet Take 1 tablet (40 mg total) by mouth daily. 90 tablet 3   bictegravir-emtricitabine-tenofovir AF (BIKTARVY) 50-200-25 MG TABS tablet Take 1 tablet by mouth daily. 30 tablet 11   brimonidine (ALPHAGAN) 0.2 % ophthalmic solution USE 1 DROP IN EACH EYE TWICE DAILY     clopidogrel (PLAVIX) 75 MG tablet TAKE 1 TABLET BY MOUTH DAILY 30 tablet 3   COMBIGAN 0.2-0.5 % ophthalmic solution Place 1 drop into the left eye 2 (two) times daily.      diclofenac Sodium (VOLTAREN) 1 % GEL APPLY TWO GRAMS TO AFFECTED AREA(S) FOUR TIMES DAILY 350 g 2   dorzolamide (TRUSOPT) 2 % ophthalmic solution Place 1 drop into both eyes 2 (two) times daily.  5   dorzolamide-timolol (COSOPT) 22.3-6.8 MG/ML ophthalmic solution INSTILL ONE DROP IN [facility] EYE TWICE DAILY     gabapentin (NEURONTIN) 300 MG capsule Take 1 capsule (300 mg total) by mouth at bedtime. 90 capsule 3   latanoprost (XALATAN) 0.005 % ophthalmic solution Administer 1 drop to both eyes nightly.     Multiple Vitamin (MULTIVITAMIN WITH MINERALS) TABS Take 1 tablet by mouth daily.     NURTEC  75 MG TBDP DISSOLVE ONE TABLET ON OR UNDER THE TONGUE EVERY OTHER DAY 45 tablet 3   ondansetron (ZOFRAN-ODT) 4 MG disintegrating tablet Take 4 mg by mouth every 6 (six) hours as needed for nausea or vomiting.      prednisoLONE acetate (PRED FORTE) 1 % ophthalmic suspension Place 1 drop into the right eye 3 (three) times daily.     predniSONE (DELTASONE) 10 MG tablet Use as directed per doctors orders. 21 tablet 0   terazosin (HYTRIN) 5 MG capsule Take 1 capsule (5 mg total) by mouth at bedtime. 90 capsule 3   triamcinolone ointment (KENALOG) 0.1 % APPLY TO THE AFFECTED AREA(S) TWICE DAILY (Patient taking differently: Apply 1 application  topically 2 (two) times daily as needed (rash).) 454 g 3   oxyCODONE-acetaminophen (PERCOCET/ROXICET) 5-325 MG tablet TAKE 1 TABLET BY MOUTH EVERY 8 HOURS AS NEEDED FOR SEVERE PAIN (Patient not taking: Reported on 09/12/2021) 20 tablet 0   No current facility-administered medications for this visit.    Allergies  Allergen Reactions   Hctz [Hydrochlorothiazide] Other (See Comments)    Dizziness, syncope; does NOT wish to take anymore     REVIEW OF SYSTEMS:   '[X]'$  denotes positive finding, '[ ]'$  denotes negative finding Cardiac  Comments:  Chest pain or chest pressure:    Shortness of breath upon exertion:    Short of breath when lying flat:    Irregular heart rhythm:        Vascular    Pain in calf, thigh, or hip brought on by ambulation:    Pain in feet at night that wakes you up from  your sleep:     Blood clot in your veins:    Leg swelling:         Pulmonary    Oxygen at home:    Productive cough:     Wheezing:         Neurologic    Sudden weakness in arms or legs:     Sudden numbness in arms or legs:     Sudden onset of difficulty speaking or slurred speech:    Temporary loss of vision in one eye:     Problems with dizziness:         Gastrointestinal    Blood in stool:     Vomited blood:         Genitourinary    Burning when urinating:      Blood in urine:        Psychiatric    Major depression:         Hematologic    Bleeding problems:    Problems with blood clotting too easily:        Skin    Rashes or ulcers:        Constitutional    Fever or chills:      PHYSICAL EXAMINATION:  Vitals:   09/12/21 1140  BP: 125/70  Pulse: 74  Resp: 16  Temp: (!) 97.2 F (36.2 C)  TempSrc: Temporal  SpO2: 98%  Weight: 112 lb (50.8 kg)  Height: 5' 5.5" (1.664 m)    General:  WDWN in NAD; vital signs documented above Gait: Not observed HENT: WNL, normocephalic Pulmonary: normal non-labored breathing , without Rales, rhonchi,  wheezing Cardiac: regular HR Abdomen: soft, NT, no masses Skin: without rashes Vascular Exam/Pulses:  Right Left  Radial 2+ (normal) 2+ (normal)  DP Brisk by Doppler Brisk by Doppler  PT Brisk by Doppler Brisk by Doppler   Extremities: 2+ femoral pulses bilaterally ; discoloration of the left fourth toe Musculoskeletal: no muscle wasting or atrophy  Neurologic: A&O X 3;  No focal weakness or paresthesias are detected Psychiatric:  The pt has Normal affect.   Non-Invasive Vascular Imaging:   Patent aortobifemoral bypass graft with no stenosis on duplex 276 cm/s right common femoral artery  482 cm/s proximal left SFA with significant narrowing and decrease in velocity distal to this suggesting possible occlusion of SFA  ABI/TBIToday's ABIToday's TBIPrevious ABIPrevious TBI  +-------+-----------+-----------+------------+------------+  Right  0.84       0.44       0.9         0.78          +-------+-----------+-----------+------------+------------+  Left   0.44       0          0.36        0.19   ASSESSMENT/PLAN:: 81 y.o. female here for follow up for PAD  -Patient has history of aortobifemoral bypass in 2009 by Dr. Kellie Simmering.  She required revision with thrombectomy of left limb in May 2022 by Dr. Trula Slade.  She has been followed closely for high-grade stenosis of left  SFA.  The patient and her daughter were primarily concerned about the burning sensation she has in her right lateral lower leg.  She states that this has been present for over a year and has been mentioned to her PCP several times.  Her right foot is well-perfused with brisk DP and PT Doppler signals.  I am unsure of the etiology of her burning discomfort of her right lateral lower  leg.  I tried to redirect conversation to her left leg especially given that she has a new discolored left fourth toe however the patient and her daughter were only willing to discuss the burning sensation of her right leg.  By the end of the visit the daughter agreed to notify our office in 1 to 2 weeks if the left toe discoloration worsens.  I tried to express my concerns for an ischemic left fourth toe with a likely left SFA occlusion and a toe pressure of 0.  I also expressed my concerns that this could represent limb threatening ischemia.  She will require Endo versus open revascularization of left SFA in order to be able to heal left fourth toe tissue loss.  If left fourth toe improves on its own, we will repeat imaging studies in another 3 months.   Dagoberto Ligas, PA-C [facility] [phone]  Clinic MD:   Trula Slade

## 2021-09-15 ENCOUNTER — Other Ambulatory Visit: Payer: Self-pay

## 2021-10-02 ENCOUNTER — Other Ambulatory Visit: Payer: Self-pay | Admitting: Internal Medicine

## 2021-10-02 DIAGNOSIS — E785 Hyperlipidemia, unspecified: Secondary | ICD-10-CM

## 2021-10-10 ENCOUNTER — Other Ambulatory Visit: Payer: Self-pay

## 2021-10-10 ENCOUNTER — Encounter: Payer: Self-pay | Admitting: Infectious Disease

## 2021-10-10 ENCOUNTER — Ambulatory Visit (INDEPENDENT_AMBULATORY_CARE_PROVIDER_SITE_OTHER): Payer: Medicare Other | Admitting: Infectious Disease

## 2021-10-10 VITALS — BP 159/74 | HR 67 | Temp 97.4°F | Ht 67.0 in | Wt 115.0 lb

## 2021-10-10 DIAGNOSIS — I739 Peripheral vascular disease, unspecified: Secondary | ICD-10-CM | POA: Diagnosis not present

## 2021-10-10 DIAGNOSIS — B20 Human immunodeficiency virus [HIV] disease: Secondary | ICD-10-CM

## 2021-10-10 DIAGNOSIS — I10 Essential (primary) hypertension: Secondary | ICD-10-CM | POA: Diagnosis not present

## 2021-10-10 DIAGNOSIS — E785 Hyperlipidemia, unspecified: Secondary | ICD-10-CM | POA: Diagnosis not present

## 2021-10-10 MED ORDER — BIKTARVY 50-200-25 MG PO TABS: 1.0000 | ORAL_TABLET | Freq: Every day | ORAL | 11 refills | Status: DC

## 2021-10-10 NOTE — Progress Notes (Signed)
Subjective:  Chief complaint: Left-sided hip pain also still lower extremity pain    Patient ID: Jessica [name], female    DOB: May 09, 1940, 81 y.o.   MRN: [id number]  HPI  Jessica [name] is a 81 y.o. female with HIV infection who has done superbly well on her antiviral regimen, changed from Atripla to [facility] and DESCOVY -->BIKTARVY with reasonable virological suppression.  He has comorbid smoking coronary artery disease peripheral vascular disease hyperlipidemia hypertension.  She has chronic pain and is followed by internal medicine for all these conditions.  Serous noticed burning pain in her right lower extremity worse than left where she had bypass surgery done.  We obtained of vascular studies which showed continued severe peripheral vascular disease.  She does continue to smoke cigarettes which I have counseled her to work on stopping and she will try to do so.  Also having some left-sided arthritic pain today as well.         Past Medical History:  Diagnosis Date   Acute blood loss anemia 08/08/2020   Acute renal failure (ARF) ([place]) 08/17/2017   Anxiety 04/05/2012   Bilateral sensorineural hearing loss 06/11/2014   Mild to moderate on the left side and slight to mild on the right side per audiometry 05/2014.  Hearing aides with possible masking of tinnitus recommended but patient wished to defer secondary to finances.   Blood transfusion without reported diagnosis    pt denies   Bursitis of right shoulder 07/12/2012   s/p shoulder injection 07/12/2012    Cataract of right eye    REMOVED RIGHT EYE 4-19    Constipation due to pain medication 04/27/2010   Diverticulosis 02/08/2012   Extensive left-sided diverticula on colonoscopy March 2012 per Dr. Gala Romney    Essential hypertension 07/20/2006   Genital herpes 07/20/2006   Glaucoma of left eye 07/20/2006   Headache 10/20/2019   Heart murmur 1961   Human immunodeficiency virus disease ([place]) 03/27/1986    Hyperlipidemia LDL goal < 100 04/05/2012   Hypokalemia 04/12/2018   Insomnia 10/20/2019   Left hip pain 04/11/2021   Leg pain 04/11/2021   Long-term current use of opiate analgesic 03/17/2016   Loose stools 04/08/2019   Lumbar degenerative disc disease 07/20/2006   With chronic back pain    Marijuana use 07/03/2016   Micturition syncope 09/20/2015   Nausea and vomiting 04/08/2019   Opiate dependence ([place]) 04/12/2018   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[place]) 11/01/2011   s/p aortobifem bypass 5993    Periumbilical hernia 07/18/175   1 cm left periumbilical abdominal wall defect   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   DEXA 04/15/2012: L1-L4 spine T -3.9, Right femur T -3.0    Right rotator cuff tear 02/01/2013   Responds to periodic steroid injections   Seborrhea 09/01/2010   Shoulder pain, right 12/04/2017   Small bowel obstruction due to adhesions ([place]) 02/08/2012   s/p Exploratory laparotomy, lysis of adhesions 02/12/12     Subjective tinnitus of both ears 05/18/2014   Tobacco abuse 02/19/2012   Tobacco abuse    Vasovagal syncope 02/15/2015   Vitamin D deficiency 05/29/2018   Vitamin D 18.96 (04/30/2018), treated with ergocalciferol 50,000 units PO QWk X 4 weeks   Voiding dysfunction    s/p cystoscopy and meatal dilation Dec 2005    Past Surgical History:  Procedure Laterality Date   ABDOMINAL HYSTERECTOMY     AORTO-FEMORAL BYPASS GRAFT  04/2007   APPENDECTOMY     BREAST SURGERY  Breast biopsy: negative   CHOLECYSTECTOMY     COLECTOMY  01/2011   Dr. Margot Chimes; "took out 12 inches of small intestiines and removed blockage"   COLONOSCOPY  2012   EYE SURGERY     EYE SURGERY  06/29/2020   06-29-2020- RIGHT CATARACT REMOVED AND LEFT EYE SURGERY TO REMOVE SAND LIKE SUBSTANCE    FEMORAL ARTERY EXPLORATION Left 07/10/2020   Procedure: REDO LEFT FEMORAL ARTERY EXPOSURE;  Surgeon: Serafina Mitchell, MD;  Location: [place];  Service: Vascular;  Laterality: Left;   LAPAROTOMY  02/12/2012   Procedure:  EXPLORATORY LAPAROTOMY;  Surgeon: Stark Klein, MD;  Location: [place];  Service: General;  Laterality: N/A;  Exploratory Laparotomy, lysis of adhesions   SMALL INTESTINE SURGERY     THROMBECTOMY FEMORAL ARTERY Left 07/10/2020   Procedure: THROMBECTOMY AORTA-BIFEMORAL GRAFT, PROFUNDA, AND SUPERFICIAL FEMORAL ARTERY  LEFT LEG;  Surgeon: Serafina Mitchell, MD;  Location: [facility];  Service: Vascular;  Laterality: Left;    Family History  Problem Relation Age of Onset   Kidney failure Mother    Diabetes Mother    Hypertension Mother    Heart disease Mother    Glaucoma Father    Congestive Heart Failure Sister    Diabetes Sister    Kidney disease Sister    Diabetes Brother    Unexplained death Brother 38       Automobile accident   Hypothyroidism Daughter    Arthritis Daughter        Neck/Back   Healthy Son    HIV/AIDS Brother    HIV Daughter    Kidney disease Daughter    Arthritis Son        Knee   Colon cancer Neg Hx    Colon polyps Neg Hx    Esophageal cancer Neg Hx    Rectal cancer Neg Hx    Stomach cancer Neg Hx       Social History   Socioeconomic History   Marital status: Widowed    Spouse name: Not on file   Number of children: 4   Years of education: 2y college   Highest education level: Not on file  Occupational History   Occupation: retired    Comment: previously worked as a Designer, fashion/clothing for [employer]   Tobacco Use   Smoking status: Every Day    Packs/day: 0.30    Years: 50.00    Total pack years: 15.00    Types: Cigarettes    Passive exposure: Current   Smokeless tobacco: Never   Tobacco comments:    1/3 pk per day  Vaping Use   Vaping Use: Never used  Substance and Sexual Activity   Alcohol use: No    Alcohol/week: 0.0 standard drinks of alcohol    Comment: "last drink of alcohol ~ 1977"   Drug use: No    Comment: cutting back on chantix, 1 cigarette this morning   Sexual activity: Never  Other Topics Concern   Not on file  Social History Narrative    Lives alone in [place], [place]   Social Determinants of Health   Financial Resource Strain: Not on file  Food Insecurity: Not on file  Transportation Needs: Not on file  Physical Activity: Not on file  Stress: Not on file  Social Connections: Not on file    Allergies  Allergen Reactions   Hctz [Hydrochlorothiazide] Other (See Comments)    Dizziness, syncope; does NOT wish to take anymore     Current Outpatient  Medications:    acyclovir (ZOVIRAX) 400 MG tablet, TAKE 1 TABLET BY MOUTH THREE TIMES DAILY AS NEEDED FOR OUTBREAKS FOR SEVEN DAYS, Disp: 21 tablet, Rfl: 5   alendronate (FOSAMAX) 70 MG tablet, Take 1 tablet (70 mg total) by mouth every Sunday. Take with a full glass of water on an empty stomach., Disp: 12 tablet, Rfl: 3   ALPRAZolam (XANAX) 0.5 MG tablet, TAKE 1 OR 2 TABLETS BY MOUTH AT BEDTIME AS NEEDED FOR ANXIETY OR SLEEP, Disp: 60 tablet, Rfl: 1   amLODipine (NORVASC) 10 MG tablet, TAKE 1 TABLET BY MOUTH DAILY, Disp: 30 tablet, Rfl: 3   atorvastatin (LIPITOR) 10 MG tablet, TAKE 1 TABLET BY MOUTH AT BEDTIME, Disp: 90 tablet, Rfl: 3   atropine 1 % ophthalmic solution, Apply to eye., Disp: , Rfl:    benazepril (LOTENSIN) 40 MG tablet, Take 1 tablet (40 mg total) by mouth daily., Disp: 90 tablet, Rfl: 3   bictegravir-emtricitabine-tenofovir AF (BIKTARVY) 50-200-25 MG TABS tablet, Take 1 tablet by mouth daily., Disp: 30 tablet, Rfl: 11   brimonidine (ALPHAGAN) 0.2 % ophthalmic solution, USE 1 DROP IN [facility] EYE TWICE DAILY, Disp: , Rfl:    clopidogrel (PLAVIX) 75 MG tablet, TAKE 1 TABLET BY MOUTH DAILY, Disp: 30 tablet, Rfl: 3   COMBIGAN 0.2-0.5 % ophthalmic solution, Place 1 drop into the left eye 2 (two) times daily. , Disp: , Rfl:    diclofenac Sodium (VOLTAREN) 1 % GEL, APPLY TWO GRAMS TO AFFECTED AREA(S) FOUR TIMES DAILY, Disp: 350 g, Rfl: 2   dorzolamide (TRUSOPT) 2 % ophthalmic solution, Place 1 drop into both eyes 2 (two) times daily., Disp: , Rfl: 5   dorzolamide-timolol  (COSOPT) 22.3-6.8 MG/ML ophthalmic solution, INSTILL ONE DROP IN [facility] EYE TWICE DAILY, Disp: , Rfl:    gabapentin (NEURONTIN) 300 MG capsule, Take 1 capsule (300 mg total) by mouth at bedtime., Disp: 90 capsule, Rfl: 3   latanoprost (XALATAN) 0.005 % ophthalmic solution, Administer 1 drop to both eyes nightly., Disp: , Rfl:    Multiple Vitamin (MULTIVITAMIN WITH MINERALS) TABS, Take 1 tablet by mouth daily., Disp: , Rfl:    NURTEC 75 MG TBDP, DISSOLVE ONE TABLET ON OR UNDER THE TONGUE EVERY OTHER DAY, Disp: 45 tablet, Rfl: 3   ondansetron (ZOFRAN-ODT) 4 MG disintegrating tablet, Take 4 mg by mouth every 6 (six) hours as needed for nausea or vomiting. , Disp: , Rfl:    oxyCODONE-acetaminophen (PERCOCET/ROXICET) 5-325 MG tablet, TAKE 1 TABLET BY MOUTH EVERY 8 HOURS AS NEEDED FOR SEVERE PAIN (Patient not taking: Reported on 09/12/2021), Disp: 20 tablet, Rfl: 0   prednisoLONE acetate (PRED FORTE) 1 % ophthalmic suspension, Place 1 drop into the right eye 3 (three) times daily., Disp: , Rfl:    predniSONE (DELTASONE) 10 MG tablet, Use as directed per doctors orders., Disp: 21 tablet, Rfl: 0   terazosin (HYTRIN) 5 MG capsule, Take 1 capsule (5 mg total) by mouth at bedtime., Disp: 90 capsule, Rfl: 3   triamcinolone ointment (KENALOG) 0.1 %, APPLY TO THE AFFECTED AREA(S) TWICE DAILY (Patient taking differently: Apply 1 application  topically 2 (two) times daily as needed (rash).), Disp: 454 g, Rfl: 3    Review of Systems  Constitutional:  Negative for activity change, appetite change, chills, diaphoresis, fatigue, fever and unexpected weight change.  HENT:  Negative for congestion, rhinorrhea, sinus pressure, sneezing, sore throat and trouble swallowing.   Eyes:  Negative for photophobia and visual disturbance.  Respiratory:  Negative for cough,  chest tightness, shortness of breath, wheezing and stridor.   Cardiovascular:  Negative for chest pain, palpitations and leg swelling.  Gastrointestinal:   Negative for abdominal distention, abdominal pain, anal bleeding, blood in stool, constipation, diarrhea, nausea and vomiting.  Genitourinary:  Negative for difficulty urinating, dysuria, flank pain and hematuria.  Musculoskeletal:  Positive for arthralgias and myalgias. Negative for back pain, gait problem and joint swelling.  Skin:  Negative for color change, pallor, rash and wound.  Neurological:  Negative for dizziness, tremors, weakness, light-headedness and headaches.  Hematological:  Negative for adenopathy. Does not bruise/bleed easily.  Psychiatric/Behavioral:  Negative for agitation, behavioral problems, confusion, decreased concentration, dysphoric mood, sleep disturbance and suicidal ideas.        Objective:   Physical Exam Constitutional:      General: She is not in acute distress.    Appearance: She is not diaphoretic.  HENT:     Head: Normocephalic and atraumatic.     Right Ear: External ear normal.     Left Ear: External ear normal.     Nose: Nose normal.     Mouth/Throat:     Pharynx: No oropharyngeal exudate.  Eyes:     General: No scleral icterus.       Right eye: No discharge.        Left eye: No discharge.     Extraocular Movements: Extraocular movements intact.     Conjunctiva/sclera: Conjunctivae normal.  Cardiovascular:     Rate and Rhythm: Normal rate and regular rhythm.     Heart sounds:     No friction rub.  Pulmonary:     Effort: Pulmonary effort is normal. No respiratory distress.     Breath sounds: No wheezing or rales.  Abdominal:     General: There is no distension.     Palpations: Abdomen is soft.     Tenderness: There is no rebound.  Musculoskeletal:        General: No tenderness. Normal range of motion.     Cervical back: Normal range of motion and neck supple.  Lymphadenopathy:     Cervical: No cervical adenopathy.  Skin:    General: Skin is warm and dry.     Coloration: Skin is not jaundiced or pale.     Findings: No erythema, lesion  or rash.  Neurological:     General: No focal deficit present.     Mental Status: She is alert and oriented to person, place, and time.     Coordination: Coordination normal.  Psychiatric:        Mood and Affect: Mood normal.        Behavior: Behavior normal.        Thought Content: Thought content normal.        Judgment: Judgment normal.             Assessment & Plan:   HIV disease:  I will check a viral load CD4 count CBC CMP  I will continue her BIKTARVY.    Hypertension: Suboptimally controlled based on vitals today in clinic but she is followed closely with internal medicine.  Peripheral vascular disease defer to PCP vascular surgery continue optimization of blood pressure smoking cessation efforts statin

## 2021-10-11 LAB — T-HELPER CELLS (CD4) COUNT (NOT AT ARMC)
CD4 % Helper T Cell: 42 % (ref 33–65)
CD4 T Cell Abs: 1076 /uL (ref 400–1790)

## 2021-10-12 ENCOUNTER — Other Ambulatory Visit: Payer: Self-pay | Admitting: Internal Medicine

## 2021-10-12 DIAGNOSIS — B029 Zoster without complications: Secondary | ICD-10-CM

## 2021-10-12 LAB — CBC WITH DIFFERENTIAL/PLATELET
Absolute Monocytes: 252 cells/uL (ref 200–950)
Basophils Absolute: 68 cells/uL (ref 0–200)
Basophils Relative: 1 %
Eosinophils Absolute: 7 cells/uL — ABNORMAL LOW (ref 15–500)
Eosinophils Relative: 0.1 %
HCT: 33.7 % — ABNORMAL LOW (ref 35.0–45.0)
Hemoglobin: 11.5 g/dL — ABNORMAL LOW (ref 11.7–15.5)
Lymphs Abs: 2475 cells/uL (ref 850–3900)
MCH: 34.7 pg — ABNORMAL HIGH (ref 27.0–33.0)
MCHC: 34.1 g/dL (ref 32.0–36.0)
MCV: 101.8 fL — ABNORMAL HIGH (ref 80.0–100.0)
MPV: 9.4 fL (ref 7.5–12.5)
Monocytes Relative: 3.7 %
Neutro Abs: 3998 cells/uL (ref 1500–7800)
Neutrophils Relative %: 58.8 %
Platelets: 321 10*3/uL (ref 140–400)
RBC: 3.31 10*6/uL — ABNORMAL LOW (ref 3.80–5.10)
RDW: 13.1 % (ref 11.0–15.0)
Total Lymphocyte: 36.4 %
WBC: 6.8 10*3/uL (ref 3.8–10.8)

## 2021-10-12 LAB — COMPLETE METABOLIC PANEL WITH GFR
AG Ratio: 1.5 (calc) (ref 1.0–2.5)
ALT: 7 U/L (ref 6–29)
AST: 16 U/L (ref 10–35)
Albumin: 4.3 g/dL (ref 3.6–5.1)
Alkaline phosphatase (APISO): 62 U/L (ref 37–153)
BUN: 10 mg/dL (ref 7–25)
CO2: 21 mmol/L (ref 20–32)
Calcium: 9.5 mg/dL (ref 8.6–10.4)
Chloride: 109 mmol/L (ref 98–110)
Creat: 0.78 mg/dL (ref 0.60–1.00)
Globulin: 2.9 g/dL (calc) (ref 1.9–3.7)
Glucose, Bld: 89 mg/dL (ref 65–99)
Potassium: 4 mmol/L (ref 3.5–5.3)
Sodium: 141 mmol/L (ref 135–146)
Total Bilirubin: 0.5 mg/dL (ref 0.2–1.2)
Total Protein: 7.2 g/dL (ref 6.1–8.1)
eGFR: 77 mL/min/{1.73_m2} (ref >=60)

## 2021-10-12 LAB — HIV-1 RNA QUANT-NO REFLEX-BLD
HIV 1 RNA Quant: NOT DETECTED Copies/mL
HIV-1 RNA Quant, Log: NOT DETECTED Log cps/mL

## 2021-10-12 LAB — LIPID PANEL
Cholesterol: 139 mg/dL (ref <200)
HDL: 78 mg/dL (ref >=50)
LDL Cholesterol (Calc): 46 mg/dL (calc)
Non-HDL Cholesterol (Calc): 61 mg/dL (calc) (ref <130)
Total CHOL/HDL Ratio: 1.8 (calc) (ref <5.0)
Triglycerides: 71 mg/dL (ref <150)

## 2021-10-12 LAB — RPR: RPR Ser Ql: NONREACTIVE

## 2021-10-19 ENCOUNTER — Ambulatory Visit: Payer: Self-pay

## 2021-10-19 NOTE — Patient Outreach (Signed)
  Care Coordination   Initial Visit Note   10/19/2021 Name: Jessica [name] MRN: [id number] DOB: 11/22/1940  Jessica [name] Jessica [name] is a 81 y.o. year old female who sees Jessica Falcon, MD for primary care. I spoke with  Jessica [name] by phone today  What matters to the patients health and wellness today?  "I think I am doing pretty well.  My hip arthritis is the only issue and you can't really do anything about that except manage the pain".  Patient notes her daughter manages her medications as she is blind and she thinks she is not out of any.  Advised patient to have her daughter call the clinic if she is in need of anything.  Patient to make f/u appt in the next few months.   Goals Addressed   None     SDOH assessments and interventions completed:  Yes  SDOH Interventions Today    Flowsheet Row Most Recent Value  SDOH Interventions   Housing Interventions Intervention Not Indicated  Transportation Interventions Intervention Not Indicated        Care Coordination Interventions Activated:  No  Care Coordination Interventions:  No, not indicated   Follow up plan: No further intervention required.   Encounter Outcome:  Pt. Visit Completed

## 2021-10-24 DIAGNOSIS — H40051 Ocular hypertension, right eye: Secondary | ICD-10-CM | POA: Diagnosis not present

## 2021-10-31 ENCOUNTER — Other Ambulatory Visit: Payer: Self-pay | Admitting: Internal Medicine

## 2021-10-31 DIAGNOSIS — F411 Generalized anxiety disorder: Secondary | ICD-10-CM

## 2021-10-31 DIAGNOSIS — M25552 Pain in left hip: Secondary | ICD-10-CM

## 2021-10-31 MED ORDER — OXYCODONE-ACETAMINOPHEN 5-325 MG PO TABS: ORAL_TABLET | ORAL | 0 refills | Status: DC

## 2021-10-31 MED ORDER — GABAPENTIN 300 MG PO CAPS: 300.0000 mg | ORAL_CAPSULE | Freq: Every day | ORAL | 3 refills | Status: DC

## 2021-10-31 MED ORDER — ALPRAZOLAM 0.5 MG PO TABS: ORAL_TABLET | ORAL | 1 refills | Status: DC

## 2021-10-31 NOTE — Telephone Encounter (Signed)
Refill Request- Pt had fall on Friday and hurt her hip and shoulder on the left.  Pt prefers Tramadol rather Oxycodone if possible.  gabapentin (NEURONTIN) 300 MG capsule  ALPRAZolam (XANAX) 0.5 MG tablet  oxyCODONE-acetaminophen (PERCOCET/ROXICET) 5-325 MG tablet  [facility] - [place], [place] - [address]

## 2021-10-31 NOTE — Telephone Encounter (Signed)
Last appointment  07/21/2021.  Next Appointment 12/30/2021.  No ToxAssure.

## 2021-11-01 ENCOUNTER — Other Ambulatory Visit: Payer: Self-pay | Admitting: Internal Medicine

## 2021-11-01 NOTE — Telephone Encounter (Signed)
Next appt scheduled 12/30/21 with PCP. 

## 2021-11-16 ENCOUNTER — Other Ambulatory Visit: Payer: Self-pay | Admitting: Internal Medicine

## 2021-11-16 DIAGNOSIS — H18231 Secondary corneal edema, right eye: Secondary | ICD-10-CM | POA: Diagnosis not present

## 2021-11-17 NOTE — Telephone Encounter (Signed)
No ToxAssure.  Last appointment 08/26/2021.  Next appointment 12/30/2021.

## 2021-11-21 ENCOUNTER — Other Ambulatory Visit: Payer: Self-pay | Admitting: Internal Medicine

## 2021-11-23 NOTE — Telephone Encounter (Signed)
Not appropriate for refill.  Please call patient and ask if she needs to be seen given she is using this medication much more than previous.

## 2021-12-01 ENCOUNTER — Other Ambulatory Visit: Payer: Self-pay | Admitting: Internal Medicine

## 2021-12-01 DIAGNOSIS — I10 Essential (primary) hypertension: Secondary | ICD-10-CM

## 2021-12-08 ENCOUNTER — Other Ambulatory Visit: Payer: Self-pay | Admitting: Internal Medicine

## 2021-12-08 NOTE — Telephone Encounter (Signed)
Last appointment  07/21/2021.  Next Appointment 12/30/2021.  No ToxAssure.

## 2021-12-13 ENCOUNTER — Encounter: Payer: Self-pay | Admitting: Physician Assistant

## 2021-12-13 ENCOUNTER — Ambulatory Visit (INDEPENDENT_AMBULATORY_CARE_PROVIDER_SITE_OTHER): Payer: Medicare Other | Admitting: Physician Assistant

## 2021-12-13 ENCOUNTER — Ambulatory Visit (HOSPITAL_COMMUNITY)
Admission: RE | Admit: 2021-12-13 | Discharge: 2021-12-13 | Disposition: A | Discharge status: Home / Self Care | Payer: Medicare Other | Source: Ambulatory Visit | Attending: Vascular Surgery | Admitting: Vascular Surgery

## 2021-12-13 ENCOUNTER — Ambulatory Visit (INDEPENDENT_AMBULATORY_CARE_PROVIDER_SITE_OTHER)
Admission: RE | Admit: 2021-12-13 | Discharge: 2021-12-13 | Disposition: A | Discharge status: Home / Self Care | Payer: Medicare Other | Source: Ambulatory Visit | Attending: Vascular Surgery | Admitting: Vascular Surgery

## 2021-12-13 VITALS — BP 118/64 | HR 64 | Temp 97.6°F | Ht 68.0 in | Wt 115.5 lb

## 2021-12-13 DIAGNOSIS — I739 Peripheral vascular disease, unspecified: Secondary | ICD-10-CM

## 2021-12-13 DIAGNOSIS — T82868D Thrombosis of vascular prosthetic devices, implants and grafts, subsequent encounter: Secondary | ICD-10-CM

## 2021-12-13 DIAGNOSIS — I745 Embolism and thrombosis of iliac artery: Secondary | ICD-10-CM | POA: Diagnosis not present

## 2021-12-13 DIAGNOSIS — I743 Embolism and thrombosis of arteries of the lower extremities: Secondary | ICD-10-CM | POA: Diagnosis not present

## 2021-12-13 NOTE — Progress Notes (Signed)
[facility] HISTORY AND PHYSICAL   History of Present Illness:  Patient is a 81 y.o. year old female who presents for evaluation of PAD.  She has history of aortobifemoral bypass graft in 2009 by Dr. Kellie Simmering.  She underwent thrombectomy of left limb as well as thrombectomy of profunda and SFA on 07/11/2020 by Dr. Trula Slade.  She denies rest pain short distance claudication or non healing wounds.   She continues to have on/off lateral right LE burning sensation.  She states this does not interfere with every day living.    Past Medical History:  Diagnosis Date   Acute blood loss anemia 08/08/2020   Acute renal failure (ARF) ([place]) 08/17/2017   Anxiety 04/05/2012   Bilateral sensorineural hearing loss 06/11/2014   Mild to moderate on the left side and slight to mild on the right side per audiometry 05/2014.  Hearing aides with possible masking of tinnitus recommended but patient wished to defer secondary to finances.   Blood transfusion without reported diagnosis    pt denies   Bursitis of right shoulder 07/12/2012   s/p shoulder injection 07/12/2012    Cataract of right eye    REMOVED RIGHT EYE 4-19    Constipation due to pain medication 04/27/2010   Diverticulosis 02/08/2012   Extensive left-sided diverticula on colonoscopy March 2012 per Dr. Gala Romney    Essential hypertension 07/20/2006   Genital herpes 07/20/2006   Glaucoma of left eye 07/20/2006   Headache 10/20/2019   Heart murmur 1961   Human immunodeficiency virus disease ([place]) 03/27/1986   Hyperlipidemia LDL goal < 100 04/05/2012   Hypokalemia 04/12/2018   Insomnia 10/20/2019   Left hip pain 04/11/2021   Leg pain 04/11/2021   Long-term current use of opiate analgesic 03/17/2016   Loose stools 04/08/2019   Lumbar degenerative disc disease 07/20/2006   With chronic back pain    Marijuana use 07/03/2016   Micturition syncope 09/20/2015   Nausea and vomiting 04/08/2019   Opiate dependence ([place]) 04/12/2018   Peripheral vascular occlusive  disease ([place]) 11/01/2011   s/p aortobifem bypass 8250    Periumbilical hernia 07/13/9765   1 cm left periumbilical abdominal wall defect   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   DEXA 04/15/2012: L1-L4 spine T -3.9, Right femur T -3.0    Right rotator cuff tear 02/01/2013   Responds to periodic steroid injections   Seborrhea 09/01/2010   Shoulder pain, right 12/04/2017   Small bowel obstruction due to adhesions ([place]) 02/08/2012   s/p Exploratory laparotomy, lysis of adhesions 02/12/12     Subjective tinnitus of both ears 05/18/2014   Tobacco abuse 02/19/2012   Tobacco abuse    Vasovagal syncope 02/15/2015   Vitamin D deficiency 05/29/2018   Vitamin D 18.96 (04/30/2018), treated with ergocalciferol 50,000 units PO QWk X 4 weeks   Voiding dysfunction    s/p cystoscopy and meatal dilation Dec 2005    Past Surgical History:  Procedure Laterality Date   ABDOMINAL HYSTERECTOMY     AORTO-FEMORAL BYPASS GRAFT  04/2007   APPENDECTOMY     BREAST SURGERY     Breast biopsy: negative   CHOLECYSTECTOMY     COLECTOMY  01/2011   Dr. Margot Chimes; "took out 12 inches of small intestiines and removed blockage"   COLONOSCOPY  2012   EYE SURGERY     EYE SURGERY  06/29/2020   06-29-2020- RIGHT CATARACT REMOVED AND LEFT EYE SURGERY TO REMOVE SAND LIKE SUBSTANCE    FEMORAL ARTERY EXPLORATION Left  07/10/2020   Procedure: REDO LEFT FEMORAL ARTERY EXPOSURE;  Surgeon: Serafina Mitchell, MD;  Location: [facility];  Service: Vascular;  Laterality: Left;   LAPAROTOMY  02/12/2012   Procedure: EXPLORATORY LAPAROTOMY;  Surgeon: Stark Klein, MD;  Location: [facility];  Service: General;  Laterality: N/A;  Exploratory Laparotomy, lysis of adhesions   SMALL INTESTINE SURGERY     THROMBECTOMY FEMORAL ARTERY Left 07/10/2020   Procedure: THROMBECTOMY AORTA-BIFEMORAL GRAFT, PROFUNDA, AND SUPERFICIAL FEMORAL ARTERY  LEFT LEG;  Surgeon: Serafina Mitchell, MD;  Location: [facility];  Service: Vascular;  Laterality: Left;    ROS:   General:  No weight  loss, Fever, chills  HEENT: No recent headaches, no nasal bleeding, no visual changes, no sore throat  Neurologic: No dizziness, blackouts, seizures. No recent symptoms of stroke or mini- stroke. No recent episodes of slurred speech, or temporary blindness.  Cardiac: No recent episodes of chest pain/pressure, no shortness of breath at rest.  No shortness of breath with exertion.  Denies history of atrial fibrillation or irregular heartbeat  Vascular: No history of rest pain in feet.  No history of claudication.  No history of non-healing ulcer, No history of DVT   Pulmonary: No home oxygen, no productive cough, no hemoptysis,  No asthma or wheezing  Musculoskeletal:  '[ ]'$  Arthritis, '[ ]'$  Low back pain,  '[ ]'$  Joint pain  Hematologic:No history of hypercoagulable state.  No history of easy bleeding.  No history of anemia  Gastrointestinal: No hematochezia or melena,  No gastroesophageal reflux, no trouble swallowing  Urinary: '[ ]'$  chronic Kidney disease, '[ ]'$  on HD - '[ ]'$  MWF or '[ ]'$  TTHS, '[ ]'$  Burning with urination, '[ ]'$  Frequent urination, '[ ]'$  Difficulty urinating;   Skin: No rashes  Psychological: No history of anxiety,  No history of depression  Social History Social History   Tobacco Use   Smoking status: Every Day    Packs/day: 0.30    Years: 50.00    Total pack years: 15.00    Types: Cigarettes    Passive exposure: Current   Smokeless tobacco: Never   Tobacco comments:    1/3 pk per day  Vaping Use   Vaping Use: Never used  Substance Use Topics   Alcohol use: No    Alcohol/week: 0.0 standard drinks of alcohol    Comment: "last drink of alcohol ~ 1977"   Drug use: No    Comment: cutting back on chantix, 1 cigarette this morning    Family History Family History  Problem Relation Age of Onset   Kidney failure Mother    Diabetes Mother    Hypertension Mother    Heart disease Mother    Glaucoma Father    Congestive Heart Failure Sister    Diabetes Sister    Kidney  disease Sister    Diabetes Brother    Unexplained death Brother 35       Automobile accident   Hypothyroidism Daughter    Arthritis Daughter        Neck/Back   Healthy Son    HIV/AIDS Brother    HIV Daughter    Kidney disease Daughter    Arthritis Son        Knee   Colon cancer Neg Hx    Colon polyps Neg Hx    Esophageal cancer Neg Hx    Rectal cancer Neg Hx    Stomach cancer Neg Hx     Allergies  Allergies  Allergen Reactions  Hctz [Hydrochlorothiazide] Other (See Comments)    Dizziness, syncope; does NOT wish to take anymore     Current Outpatient Medications  Medication Sig Dispense Refill   acyclovir (ZOVIRAX) 400 MG tablet TAKE 1 TABLET BY MOUTH THREE TIMES DAILY AS NEEDED FOR OUTBREAKS FOR SEVEN DAYS 21 tablet 5   alendronate (FOSAMAX) 70 MG tablet Take 1 tablet (70 mg total) by mouth every Sunday. Take with a full glass of water on an empty stomach. 12 tablet 3   ALPRAZolam (XANAX) 0.5 MG tablet TAKE 1 OR 2 TABLETS BY MOUTH AT BEDTIME AS NEEDED FOR ANXIETY OR SLEEP 60 tablet 1   amLODipine (NORVASC) 10 MG tablet TAKE 1 TABLET BY MOUTH DAILY 90 tablet 3   atorvastatin (LIPITOR) 10 MG tablet TAKE 1 TABLET BY MOUTH AT BEDTIME 90 tablet 3   atropine 1 % ophthalmic solution Apply to eye.     benazepril (LOTENSIN) 40 MG tablet Take 1 tablet (40 mg total) by mouth daily. 90 tablet 3   bictegravir-emtricitabine-tenofovir AF (BIKTARVY) 50-200-25 MG TABS tablet Take 1 tablet by mouth daily. 30 tablet 11   brimonidine (ALPHAGAN) 0.2 % ophthalmic solution USE 1 DROP IN EACH EYE TWICE DAILY     clopidogrel (PLAVIX) 75 MG tablet TAKE 1 TABLET BY MOUTH DAILY 30 tablet 3   COMBIGAN 0.2-0.5 % ophthalmic solution Place 1 drop into the left eye 2 (two) times daily.      diclofenac Sodium (VOLTAREN) 1 % GEL APPLY TWO GRAMS TO AFFECTED AREA(S) FOUR TIMES DAILY 350 g 2   dorzolamide (TRUSOPT) 2 % ophthalmic solution Place 1 drop into both eyes 2 (two) times daily.  5    dorzolamide-timolol (COSOPT) 22.3-6.8 MG/ML ophthalmic solution INSTILL ONE DROP IN [facility] EYE TWICE DAILY     gabapentin (NEURONTIN) 300 MG capsule Take 1 capsule (300 mg total) by mouth at bedtime. 90 capsule 3   latanoprost (XALATAN) 0.005 % ophthalmic solution Administer 1 drop to both eyes nightly.     Multiple Vitamin (MULTIVITAMIN WITH MINERALS) TABS Take 1 tablet by mouth daily.     NURTEC 75 MG TBDP DISSOLVE ONE TABLET ON OR UNDER THE TONGUE EVERY OTHER DAY 45 tablet 3   ondansetron (ZOFRAN-ODT) 4 MG disintegrating tablet Take 4 mg by mouth every 6 (six) hours as needed for nausea or vomiting.      oxyCODONE-acetaminophen (PERCOCET/ROXICET) 5-325 MG tablet TAKE 1 TABLET BY MOUTH EVERY 4 HOURS AS NEEDED FOR PAIN 20 tablet 0   prednisoLONE acetate (PRED FORTE) 1 % ophthalmic suspension Place 1 drop into the right eye 3 (three) times daily.     predniSONE (DELTASONE) 10 MG tablet Use as directed per doctors orders. 21 tablet 0   terazosin (HYTRIN) 5 MG capsule Take 1 capsule (5 mg total) by mouth at bedtime. 90 capsule 3   triamcinolone ointment (KENALOG) 0.1 % APPLY TO THE AFFECTED AREA(S) TWICE DAILY (Patient taking differently: Apply 1 application  topically 2 (two) times daily as needed (rash).) 454 g 3   No current facility-administered medications for this visit.    Physical Examination  Vitals:   12/13/21 0955  BP: 118/64  Pulse: 64  Temp: 97.6 F (36.4 C)  TempSrc: Temporal  SpO2: 98%  Weight: 115 lb 8 oz (52.4 kg)  Height: '5\' 8"'$  (1.727 m)    Body mass index is 17.56 kg/m.  General:  Alert and oriented, no acute distress HEENT: Normal, visually impaired Neck: No bruit or JVD Pulmonary: Clear to auscultation  bilaterally Cardiac: Regular Rate and Rhythm without murmur Abdomen: Soft, non-tender, non-distended, no mass, no scars Skin: No rash Extremity no ischemic changes Musculoskeletal: No deformity or edema  Neurologic: Upper and lower extremity motor grossly  intact and symmetric  DATA:     ABI Findings:  +---------+------------------+-----+--------+--------+  Right    Rt Pressure (mmHg)IndexWaveformComment   +---------+------------------+-----+--------+--------+  Brachial 130                                      +---------+------------------+-----+--------+--------+  PTA      108               0.83 biphasic          +---------+------------------+-----+--------+--------+  DP       133               1.02 biphasic          +---------+------------------+-----+--------+--------+  Great Toe100               0.77                   +---------+------------------+-----+--------+--------+   +---------+------------------+-----+----------+-------+  Left     Lt Pressure (mmHg)IndexWaveform  Comment  +---------+------------------+-----+----------+-------+  Brachial 130                                       +---------+------------------+-----+----------+-------+  PTA      60                0.46 monophasic         +---------+------------------+-----+----------+-------+  DP       66                0.51 monophasic         +---------+------------------+-----+----------+-------+  Great Toe61                0.47                    +---------+------------------+-----+----------+-------+   +-------+-----------+-----------+------------+------------+  ABI/TBIToday's ABIToday's TBIPrevious ABIPrevious TBI  +-------+-----------+-----------+------------+------------+  Right  1.02       0.77       0.84        0.44          +-------+-----------+-----------+------------+------------+  Left   0.51       0.47       0.44        0.00          +-------+-----------+-----------+------------+------------+      Bilateral ABIs appear increased compared to prior study on 09/12/2021.     Summary:  Right: Resting right ankle-brachial index is within normal range. The  right toe-brachial index is  normal.   Left: Resting left ankle-brachial index indicates moderate left lower  extremity arterial disease. The left toe-brachial index is abnormal.    +----------+--------+-----+---------------+--------+--------+  RIGHT     PSV cm/sRatioStenosis       WaveformComments  +----------+--------+-----+---------------+--------+--------+  CFA Distal80                          biphasic          +----------+--------+-----+---------------+--------+--------+  DFA       173          30-49% stenosisbiphasic          +----------+--------+-----+---------------+--------+--------+  SFA Prox  169          30-49% stenosisbiphasic          +----------+--------+-----+---------------+--------+--------+  SFA Mid   188          30-49% stenosisbiphasic          +----------+--------+-----+---------------+--------+--------+  SFA Distal228          50-74% stenosisbiphasic          +----------+--------+-----+---------------+--------+--------+  POP Prox  98                          biphasic          +----------+--------+-----+---------------+--------+--------+  ATA Distal69                          biphasic          +----------+--------+-----+---------------+--------+--------+  PTA Distal47                          biphasic          +----------+--------+-----+---------------+--------+--------+           +----------+--------+-----+---------------+----------+---------------------  +  LEFT      PSV cm/sRatioStenosis       Waveform  Comments                +----------+--------+-----+---------------+----------+---------------------  +  CFA Distal98                                                            +----------+--------+-----+---------------+----------+---------------------  +  DFA       146                         biphasic                           +----------+--------+-----+---------------+----------+---------------------  +  SFA Prox  395          50-74% stenosisbiphasic                          +----------+--------+-----+---------------+----------+---------------------  +  SFA Mid                                         minimal flow  detected  +----------+--------+-----+---------------+----------+---------------------  +  SFA Distal130                         monophasic                        +----------+--------+-----+---------------+----------+---------------------  +  POP Prox  41                          monophasic                        +----------+--------+-----+---------------+----------+---------------------  +  ATA Distal22  monophasic                        +----------+--------+-----+---------------+----------+---------------------  +  PTA Distal25                          monophasic                        +----------+--------+-----+---------------+----------+---------------------  +      Summary:  Right: 50-74% stenosis noted in the distal superficial femoral artery.  Left: 50-74% stenosis (top end of range) in the proximal superficial  femoral artery.  Minimal to no flow detected in the mid SFA (difficult to determine due to  bulky plaque), however, there is reconstitution of flow in the distal SFA.    Aorta Graft:  +------------------------+--------+--------+--------+--------+                          PSV cm/sStenosisWaveformComments  +------------------------+--------+--------+--------+--------+  Inflow                  40                                +------------------------+--------+--------+--------+--------+  Prox anastomosis Rt     94                                +------------------------+--------+--------+--------+--------+  Proximal graft Rt       70                                 +------------------------+--------+--------+--------+--------+  Mid graft Rt            46                                +------------------------+--------+--------+--------+--------+  Distal graft Rt         76                                +------------------------+--------+--------+--------+--------+                                                            +------------------------+--------+--------+--------+--------+  Right Distal anastomosis140                               +------------------------+--------+--------+--------+--------+  Right Outflow           149                               +------------------------+--------+--------+--------+--------+  Left Limb prx anast     66                                +------------------------+--------+--------+--------+--------+  Left limb prx  53                                +------------------------+--------+--------+--------+--------+  Left limb mid           45                                Left limb dist          77                                Left limb dst anast     104                               left limb out                                                                     155                               +------------------------+--------+--------+--------+--------+     Summary:  Stenosis:  Widely patent aortobifemoral bypass graft.   ASSESSMENT/ PLAN:  81 year old female with a remote history of an aortobifemoral bypass graft performed for severe claudication. Followed by left LE ischemia 07/10/20 requiring  Thrombectomy of left limb of aortobifemoral bypass graft, profunda, and superficial femoral artery.  She denies new symptoms of ischemia.  No weakness, loss of motor or loss of sensation.  No non healing wounds.  He studies are stable without new stenosis or changes in ABI's.  Plan for f/u in 1 year.  She knows what symptoms  of ischemia are and if these occur she will call our office.    Roxy Horseman PA-C [facility] Office: [phone]  MD in clinic [place]

## 2021-12-21 ENCOUNTER — Other Ambulatory Visit: Payer: Self-pay | Admitting: Internal Medicine

## 2021-12-21 DIAGNOSIS — F411 Generalized anxiety disorder: Secondary | ICD-10-CM

## 2021-12-29 ENCOUNTER — Other Ambulatory Visit: Payer: Self-pay | Admitting: Internal Medicine

## 2021-12-29 NOTE — Telephone Encounter (Signed)
Will hold off on refilling this medicine which does not seem to be chronic as she has an appointment with Dr. Daryll Drown tomorrow. Can be addressed in the office at that time.

## 2021-12-29 NOTE — Telephone Encounter (Signed)
Last visit 07/21/2021.  Next visit 12/30/2021.  No [employer] seen.

## 2021-12-30 ENCOUNTER — Encounter: Payer: Self-pay | Admitting: Internal Medicine

## 2021-12-30 ENCOUNTER — Other Ambulatory Visit: Payer: Self-pay

## 2021-12-30 ENCOUNTER — Other Ambulatory Visit: Payer: Self-pay | Admitting: Internal Medicine

## 2021-12-30 ENCOUNTER — Ambulatory Visit (INDEPENDENT_AMBULATORY_CARE_PROVIDER_SITE_OTHER): Payer: Medicare Other | Admitting: Internal Medicine

## 2021-12-30 VITALS — BP 117/58 | HR 64 | Temp 98.1°F | Ht 67.0 in | Wt 117.5 lb

## 2021-12-30 DIAGNOSIS — F17200 Nicotine dependence, unspecified, uncomplicated: Secondary | ICD-10-CM

## 2021-12-30 DIAGNOSIS — F411 Generalized anxiety disorder: Secondary | ICD-10-CM | POA: Diagnosis not present

## 2021-12-30 DIAGNOSIS — S8011XA Contusion of right lower leg, initial encounter: Secondary | ICD-10-CM

## 2021-12-30 DIAGNOSIS — F17210 Nicotine dependence, cigarettes, uncomplicated: Secondary | ICD-10-CM | POA: Diagnosis not present

## 2021-12-30 DIAGNOSIS — I739 Peripheral vascular disease, unspecified: Secondary | ICD-10-CM | POA: Diagnosis not present

## 2021-12-30 DIAGNOSIS — M810 Age-related osteoporosis without current pathological fracture: Secondary | ICD-10-CM

## 2021-12-30 DIAGNOSIS — M79661 Pain in right lower leg: Secondary | ICD-10-CM

## 2021-12-30 DIAGNOSIS — I10 Essential (primary) hypertension: Secondary | ICD-10-CM

## 2021-12-30 DIAGNOSIS — D7589 Other specified diseases of blood and blood-forming organs: Secondary | ICD-10-CM

## 2021-12-30 DIAGNOSIS — F11229 Opioid dependence with intoxication, unspecified: Secondary | ICD-10-CM

## 2021-12-30 DIAGNOSIS — B20 Human immunodeficiency virus [HIV] disease: Secondary | ICD-10-CM

## 2021-12-30 DIAGNOSIS — K5903 Drug induced constipation: Secondary | ICD-10-CM

## 2021-12-30 DIAGNOSIS — Z79899 Other long term (current) drug therapy: Secondary | ICD-10-CM

## 2021-12-30 DIAGNOSIS — Z23 Encounter for immunization: Secondary | ICD-10-CM | POA: Diagnosis not present

## 2021-12-30 DIAGNOSIS — D62 Acute posthemorrhagic anemia: Secondary | ICD-10-CM | POA: Diagnosis not present

## 2021-12-30 DIAGNOSIS — M25552 Pain in left hip: Secondary | ICD-10-CM

## 2021-12-30 DIAGNOSIS — A6000 Herpesviral infection of urogenital system, unspecified: Secondary | ICD-10-CM

## 2021-12-30 MED ORDER — GABAPENTIN 300 MG PO CAPS: 600.0000 mg | ORAL_CAPSULE | Freq: Three times a day (TID) | ORAL | 3 refills | Status: DC

## 2021-12-30 MED ORDER — ALPRAZOLAM 0.5 MG PO TABS: ORAL_TABLET | ORAL | 1 refills | Status: DC

## 2021-12-30 MED ORDER — OXYCODONE-ACETAMINOPHEN 5-325 MG PO TABS: 1.0000 | ORAL_TABLET | Freq: Two times a day (BID) | ORAL | 0 refills | Status: DC | PRN

## 2021-12-30 NOTE — Progress Notes (Unsigned)
Established Patient Office Visit  Subjective   Patient ID: Jessica [name], female    DOB: 24-Jan-1941  Age: 81 y.o. MRN: [id number]  Chief Complaint  Patient presents with   Check-up Visit    Recent fall - right leg wound.   Medication Refill   Flu Vaccine    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  He further has well controlled HIV, Herpes simplex chronic infection, HTN, GAD, HLD, osteoporosis, opioid induced constipation and TUD.    Today Jessica [name] reports that she sustained a fall and has a Right leg wound.  She has almost complete blindness.  She was checking out her new deck and was not aware that they had not put in the floor yet.  She walked out on the non existent deck and fell.  Her wound is mid shin, about 1cm by 60m, no purulence.  She has fluctuance underneath and some darkening, probably a bruise.  She has good ROM of the ankle and is able to walk with cane and assistance.    She otherwise notes continued pain in the left hip.  Imaging has revealed OA of that hip.  She notes that on average the pain is 6-7/10 and limits her mobility.  When she takes her percocet she has complete resolution of pain.  She is taking about 2 tabs a day, sometimes multiple times a day, on average and then running out.  She also has burning pain of the right leg.  She does well with gabapentin and would like to be prescribed enough to take as she has been taking TID.    Further issue includes her sleep.  She notes insomnia.  She has difficulty falling and staying asleep. She is on alprazolam chronically for this (long term medication).  She would like to increase this.  Given the other medications she is on, I advised she take 2-3 tabs of the 0.'5mg'$  xanax.  If this is not effective, we will decrease and collaborate on other options.   Her left leg pain is well controlled today.  She was  seen by Vascular surgery in July and October.  She will continue to follow with them for PAD.   She follows with Dr. VTommy Medalfor HIV.  She is well controlled on Biktarvy.   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  She is healing well, but it does not seem to have improved her vision at this time.      Patient Active Problem List   Diagnosis Date Noted   Toe pain 07/21/2021   Splinter of right hand 06/03/2021   Pain in right lower leg 06/03/2021   Osteoarthritis of left hip 06/03/2021   Extensor tendonitis of foot 06/03/2021   Leg pain 04/11/2021   Left hip pain 04/11/2021   Secondary corneal edema of right eye 04/07/2021   Acute blood loss anemia 08/08/2020   [place]terial embol[place] and [place] of [place] 07/11/2020   Status post surgery 07/10/2020   Diarrhea 11/11/2019   Renal insufficiency 11/11/2019   Headache 10/20/2019   Insomnia 10/20/2019   History of vitamin D deficiency 05/29/2018   Opiate dependence ([place] 04/12/2018   Vasovagal syncope 132/95/1884  Diastolic dysfunction 016/60/6301  Marijuana use 07/03/2016   Bilateral sensorineural hearing loss 06/11/2014   Subjective tinnitus of both  ears 05/18/2014   Chronic sinusitis 04/02/2013   Right rotator cuff tear 02/01/2013   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   Generalized anxiety disorder 04/05/2012   Healthcare maintenance 04/05/2012   Hyperlipidemia 04/05/2012   Smoker 02/19/2012   History of small bowel obstruction 02/08/2012   Diverticulosis of colon without hemorrhage 02/08/2012   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[place]) 11/01/2011   Constipation due to pain medication 04/27/2010   Genital herpes 07/20/2006   Glaucoma of left eye 07/20/2006   Essential hypertension 07/20/2006   Lumbar degenerative disc disease 07/20/2006   History of abnormal cervical Pap smear 07/20/2006   Human immunodeficiency virus disease ([place]) 03/27/1986      Review of Systems  Constitutional:   Negative for chills, diaphoresis, fever, malaise/fatigue and weight loss.  Eyes:  Positive for blurred vision. Negative for discharge and redness.  Respiratory:  Negative for cough and wheezing.   Cardiovascular:  Positive for claudication. Negative for chest pain, palpitations and leg swelling.  Gastrointestinal:  Negative for constipation, diarrhea and heartburn.  Genitourinary:  Negative for dysuria and frequency.  Musculoskeletal:  Positive for back pain, falls, joint pain and myalgias.  Neurological:  Positive for tingling, sensory change and headaches. Negative for dizziness, focal weakness, loss of consciousness and weakness.  Psychiatric/Behavioral:  Negative for depression. The patient is nervous/anxious and has insomnia.       Objective:     BP (!) 117/58 (BP Location: Right Arm, Patient Position: Sitting, Cuff Size: Small)   Pulse 64   Temp 98.1 F (36.7 C) (Oral)   Ht '5\' 7"'$  (1.702 m)   Wt 117 lb 8 oz (53.3 kg)   SpO2 98% Comment: RA  BMI 18.40 kg/m  {Vitals History (Optional):23777}  Physical Exam Vitals and nursing note reviewed.  Constitutional:      General: She is not in acute distress.    Appearance: Normal appearance. She is normal weight. She is not toxic-appearing.  HENT:     Head: Normocephalic and atraumatic.  Eyes:     General: No scleral icterus.       Right eye: No discharge.        Left eye: No discharge.     Comments: She has cloudy conjunctivae on the left.  She only sees shadows.  Using cane.   Cardiovascular:     Rate and Rhythm: Normal rate.  Pulmonary:     Effort: Pulmonary effort is normal. No respiratory distress.  Abdominal:     General: Abdomen is flat.  Musculoskeletal:        General: Tenderness and signs of injury (right shin, small wound 1cm X 55m with underlying hematoma) present. No swelling or deformity.     Cervical back: Normal range of motion and neck supple.     Right lower leg: No edema.     Left lower leg: No edema.   Skin:    General: Skin is warm and dry.     Coloration: Skin is not jaundiced or pale.     Findings: Bruising present.  Neurological:     Mental Status: She is alert and oriented to person, place, and time. Mental status is at baseline.  Psychiatric:        Mood and Affect: Mood normal.        Behavior: Behavior normal.    CBC and BMET today   The 10-year ASCVD risk score (Arnett DK, et al., 2019) is: 27.6% - she is on plavix and statin    Assessment & Plan:  Problem List Items Addressed This Visit       Unprioritized   Essential hypertension (Chronic)   Relevant Orders   BMP8+Anion Gap   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HCC) (Chronic)   Relevant Orders   BMP8+Anion Gap   Generalized anxiety disorder (Chronic)   Relevant Medications   ALPRAZolam (XANAX) 0.5 MG tablet   Left hip pain   Relevant Medications   gabapentin (NEURONTIN) 300 MG capsule   Other Visit Diagnoses     Age-related osteoporosis without current pathological fracture    -  Primary   Relevant Orders   DG Bone Density   Macrocytosis       Relevant Orders   CBC no Diff   Need for immunization against influenza       Relevant Orders   Flu Vaccine QUAD High Dose(Fluad) (Completed)       Return in about 3 months (around 04/01/2022).    Gilles Chiquito, MD

## 2021-12-30 NOTE — Assessment & Plan Note (Signed)
She has been following with vascular surgery.  Seen in July and there was concern for the change in skin in the Left lower extremity (4th toe, loss of sensation, darkening).  She followed up again on 12/13/21 and her vascular studies appeared stable.  She has hip pain on that side, but denies any claudication type pain.  She is taking aspirin, plavix and statin with an LDL of 46.   Plan Continue follow up with vascular and taking medications as above Discussed risk with patient's daughter and when to call for assistance.

## 2021-12-30 NOTE — Assessment & Plan Note (Signed)
Her last DEXA was a few years ago.  She is on alendronate.  Given her recent fall and increased risk for falls, we will check DEXA today.  It appears she has been on this medication since 2014 and may be due a bisphosphonate holiday.  Will discuss further at next visit.   Plan Continue alendronate DEXA ordered for her to complete with mammogram.

## 2021-12-30 NOTE — Assessment & Plan Note (Signed)
BP today is 117/58 today.  She is not having any dizziness or chest pain today.  She is taking amlodipine, benazepril and terazosin without issue.  She will require her renal function checked today.   Continue 3 drug regimen BMET today

## 2021-12-30 NOTE — Patient Instructions (Signed)
[name] - -  We are going to increase your Alprazolam at night.  Take 2-3 tablets at night only as needed.   Your pain medication, you can take up to 2 times per day.  Do not take it if your pain is not above a 5-6 on the pain scale.   For your gabapentin, I have sent you in enough to take 600 TID As you have been taking it.   Please be careful with these medications as they can cause increased drowsiness, lightheadedness and falls.   Please call the clinic if you have any of these symptoms.   Thank you!  Come back to see me in 2-3 months.

## 2021-12-30 NOTE — Assessment & Plan Note (Signed)
She notes that this is not an issue today.

## 2021-12-31 LAB — BMP8+ANION GAP
Anion Gap: 14 mmol/L (ref 10.0–18.0)
BUN/Creatinine Ratio: 18 (ref 12–28)
BUN: 14 mg/dL (ref 8–27)
CO2: 20 mmol/L (ref 20–29)
Calcium: 9.8 mg/dL (ref 8.7–10.3)
Chloride: 108 mmol/L — ABNORMAL HIGH (ref 96–106)
Creatinine, Ser: 0.78 mg/dL (ref 0.57–1.00)
Glucose: 101 mg/dL — ABNORMAL HIGH (ref 70–99)
Potassium: 4.1 mmol/L (ref 3.5–5.2)
Sodium: 142 mmol/L (ref 134–144)
eGFR: 77 mL/min/{1.73_m2} (ref >59)

## 2021-12-31 LAB — CBC
Hematocrit: 37.6 % (ref 34.0–46.6)
Hemoglobin: 12.9 g/dL (ref 11.1–15.9)
MCH: 33.3 pg — ABNORMAL HIGH (ref 26.6–33.0)
MCHC: 34.3 g/dL (ref 31.5–35.7)
MCV: 97 fL (ref 79–97)
Platelets: 318 10*3/uL (ref 150–450)
RBC: 3.87 x10E6/uL (ref 3.77–5.28)
RDW: 12.1 % (ref 11.7–15.4)
WBC: 6.4 10*3/uL (ref 3.4–10.8)

## 2022-01-01 DIAGNOSIS — S8011XA Contusion of right lower leg, initial encounter: Secondary | ICD-10-CM | POA: Insufficient documentation

## 2022-01-01 NOTE — Assessment & Plan Note (Signed)
She is on chronic percocet for left hip pain.  This is her most limiting factor to her health and staying active.  She notes that with the percocet, her pain goes from a 6-7/10 to a 0-1/10.  She only takes it when needed.  She would like to stay active.   Plan Increase percocet 5/325 to BID dosing.  Sign medication agreement at next visit.

## 2022-01-01 NOTE — Assessment & Plan Note (Signed)
Recheck CBC Today

## 2022-01-01 NOTE — Assessment & Plan Note (Signed)
This is my main concern for this patient today.  She has major issues with anxiety, insomnia, chronic pain, difficulty with ambulation (falls, loss of vision).  We made changes to her medications today that will hopefully help with these issues without causing adverse effects.  Chronic monitoring of the adverse effects will be vital.  No further titrations of these medications upward given age and comorbidities.   She has had issues with over taking medications and not disposing of medications in the past, which have led to admissions about 4 years ago.  Continue to monitor closely.  Likely best to see every 2 months.

## 2022-01-01 NOTE — Assessment & Plan Note (Signed)
This seems to be mostly related to neuropathic pain from her DDD.  She had imaging many years ago which did not show impingement, however, this may have changed.  She notes good relief with gabapentin.  She has been taking it '600mg'$  TID (sometimes BID) and runs out very quickly and then has pain.  She would like to get enough of the medication to be able to take at least BID and sometimes TID.   Refill Gabapentin, 600 mg TID (2 tabs TID)

## 2022-01-01 NOTE — Assessment & Plan Note (Signed)
We discussed today.  She notes smoking about 1/4 of a ppd.  I discussed with her that smoking will continue to make PVD worse and could result in MI or stroke.  She noted understanding and will continue to try and cut down.

## 2022-01-01 NOTE — Assessment & Plan Note (Signed)
>>  ASSESSMENT AND PLAN FOR POLYPHARMACY WRITTEN ON 01/01/2022 12:02 PM BY MULLEN, EMILY B, MD  This is my main concern for this patient today.  She has major issues with anxiety, insomnia, chronic pain, difficulty with ambulation (falls, loss of vision).  We made changes to her medications today that will hopefully help with these issues without causing adverse effects.  Chronic monitoring of the adverse effects will be vital.  No further titrations of these medications upward given age and comorbidities.   She has had issues with over taking medications and not disposing of medications in the past, which have led to admissions about 4 years ago.  Continue to monitor closely.  Likely best to see every 2 months.

## 2022-01-01 NOTE — Assessment & Plan Note (Signed)
Following with Dr. Tommy Medal.  Taking her biktarvy without issue.

## 2022-01-01 NOTE — Assessment & Plan Note (Signed)
This was also a big issue for her today.  She notes taking Alprazolam for a long time.  She currently takes '1mg'$  (2 tabs of 0.5) for bedtime.  She will be able to sleep about 4-5 hours.  She is requesting an increase.  We compromised to taking 2-3 tablets at bedtime, but not increasing further.  Reminded her that all the medications provided today can cause drowsiness and to be careful taking them all together.  Will need to reassess this at every visit.

## 2022-01-01 NOTE — Assessment & Plan Note (Signed)
Healing, small hematoma/bruise underlying.  Tender to palpation.  She is walking on the leg without issue, unlikely to be broken.  Will have her continue to wrap wound until heals.  Observe at next visit.

## 2022-01-01 NOTE — Assessment & Plan Note (Signed)
She is taking acyclovir without issue.

## 2022-01-26 ENCOUNTER — Other Ambulatory Visit: Payer: Self-pay | Admitting: Internal Medicine

## 2022-01-26 NOTE — Telephone Encounter (Signed)
No [employer].  Last visit 12/30/2021.

## 2022-02-19 ENCOUNTER — Other Ambulatory Visit: Payer: Self-pay | Admitting: Internal Medicine

## 2022-02-19 DIAGNOSIS — F411 Generalized anxiety disorder: Secondary | ICD-10-CM

## 2022-02-20 ENCOUNTER — Other Ambulatory Visit: Payer: Self-pay | Admitting: Internal Medicine

## 2022-03-07 ENCOUNTER — Other Ambulatory Visit: Payer: Self-pay | Admitting: Internal Medicine

## 2022-03-16 ENCOUNTER — Other Ambulatory Visit: Payer: Self-pay | Admitting: Internal Medicine

## 2022-03-16 DIAGNOSIS — M810 Age-related osteoporosis without current pathological fracture: Secondary | ICD-10-CM

## 2022-03-21 ENCOUNTER — Other Ambulatory Visit: Payer: Self-pay | Admitting: Internal Medicine

## 2022-03-26 NOTE — Progress Notes (Deleted)
Subjective:  Chief complaint: For HIV disease on medications.    Patient ID: Jessica [name], female    DOB: 09-21-1940, 82 y.o.   MRN: [id number]  HPI  Jessica [name] is a 82 y.o. female with HIV infection who has done superbly well on her antiviral regimen, changed from Atripla to [facility] and DESCOVY -->BIKTARVY with reasonable virological suppression.  He has comorbid smoking coronary artery disease peripheral vascular disease hyperlipidemia hypertension, dependence osteoporosis, osteoarthritis Zaidi depression.  She is followed by internal medicine clinic for the bulk of these problems.            Past Medical History:  Diagnosis Date   Acute blood loss anemia 08/08/2020   Acute renal failure (ARF) ([place]) 08/17/2017   Anxiety 04/05/2012   Bilateral sensorineural hearing loss 06/11/2014   Mild to moderate on the left side and slight to mild on the right side per audiometry 05/2014.  Hearing aides with possible masking of tinnitus recommended but patient wished to defer secondary to finances.   Blood transfusion without reported diagnosis    pt denies   Bursitis of right shoulder 07/12/2012   s/p shoulder injection 07/12/2012    Cataract of right eye    REMOVED RIGHT EYE 4-19    Constipation due to pain medication 04/27/2010   Diverticulosis 02/08/2012   Extensive left-sided diverticula on colonoscopy March 2012 per Dr. Gala Romney    Essential hypertension 07/20/2006   Genital herpes 07/20/2006   Glaucoma of left eye 07/20/2006   Headache 10/20/2019   Heart murmur 1961   Human immunodeficiency virus disease ([place]) 03/27/1986   Hyperlipidemia LDL goal < 100 04/05/2012   Hypokalemia 04/12/2018   Insomnia 10/20/2019   Left hip pain 04/11/2021   Leg pain 04/11/2021   Long-term current use of opiate analgesic 03/17/2016   Loose stools 04/08/2019   Lumbar degenerative disc disease 07/20/2006   With chronic back pain    Marijuana use 07/03/2016   Micturition syncope 09/20/2015   Nausea  and vomiting 04/08/2019   Opiate dependence ([place]) 04/12/2018   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[place]) 11/01/2011   s/p aortobifem bypass 123XX123    Periumbilical hernia 123XX123   1 cm left periumbilical abdominal wall defect   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   DEXA 04/15/2012: L1-L4 spine T -3.9, Right femur T -3.0    Right rotator cuff tear 02/01/2013   Responds to periodic steroid injections   Seborrhea 09/01/2010   Shoulder pain, right 12/04/2017   Small bowel obstruction due to adhesions ([place]) 02/08/2012   s/p Exploratory laparotomy, lysis of adhesions 02/12/12     Subjective tinnitus of both ears 05/18/2014   Tobacco abuse 02/19/2012   Tobacco abuse    Vasovagal syncope 02/15/2015   Vitamin D deficiency 05/29/2018   Vitamin D 18.96 (04/30/2018), treated with ergocalciferol 50,000 units PO QWk X 4 weeks   Voiding dysfunction    s/p cystoscopy and meatal dilation Dec 2005    Past Surgical History:  Procedure Laterality Date   ABDOMINAL HYSTERECTOMY     AORTO-FEMORAL BYPASS GRAFT  04/2007   APPENDECTOMY     BREAST SURGERY     Breast biopsy: negative   CHOLECYSTECTOMY     COLECTOMY  01/2011   Dr. Margot Chimes; "took out 12 inches of small intestiines and removed blockage"   COLONOSCOPY  2012   EYE SURGERY     EYE SURGERY  06/29/2020   06-29-2020- RIGHT CATARACT REMOVED AND LEFT EYE SURGERY TO REMOVE SAND  LIKE SUBSTANCE    FEMORAL ARTERY EXPLORATION Left 07/10/2020   Procedure: REDO LEFT FEMORAL ARTERY EXPOSURE;  Surgeon: Serafina Mitchell, MD;  Location: [facility];  Service: Vascular;  Laterality: Left;   LAPAROTOMY  02/12/2012   Procedure: EXPLORATORY LAPAROTOMY;  Surgeon: Stark Klein, MD;  Location: [place] [facility];  Service: General;  Laterality: N/A;  Exploratory Laparotomy, lysis of adhesions   SMALL INTESTINE SURGERY     THROMBECTOMY FEMORAL ARTERY Left 07/10/2020   Procedure: THROMBECTOMY AORTA-BIFEMORAL GRAFT, PROFUNDA, AND SUPERFICIAL FEMORAL ARTERY  LEFT LEG;  Surgeon: Serafina Mitchell, MD;   Location: [facility];  Service: Vascular;  Laterality: Left;    Family History  Problem Relation Age of Onset   Kidney failure Mother    Diabetes Mother    Hypertension Mother    Heart disease Mother    Glaucoma Father    Congestive Heart Failure Sister    Diabetes Sister    Kidney disease Sister    Diabetes Brother    Unexplained death Brother 26       Automobile accident   Hypothyroidism Daughter    Arthritis Daughter        Neck/Back   Healthy Son    HIV/AIDS Brother    HIV Daughter    Kidney disease Daughter    Arthritis Son        Knee   Colon cancer Neg Hx    Colon polyps Neg Hx    Esophageal cancer Neg Hx    Rectal cancer Neg Hx    Stomach cancer Neg Hx       Social History   Socioeconomic History   Marital status: Widowed    Spouse name: Not on file   Number of children: 4   Years of education: 2y college   Highest education level: Not on file  Occupational History   Occupation: retired    Comment: previously worked as a Designer, fashion/clothing for [employer]   Tobacco Use   Smoking status: Every Day    Packs/day: 0.30    Years: 50.00    Total pack years: 15.00    Types: Cigarettes    Passive exposure: Current   Smokeless tobacco: Never   Tobacco comments:    1/3 pk per day  Vaping Use   Vaping Use: Never used  Substance and Sexual Activity   Alcohol use: No    Alcohol/week: 0.0 standard drinks of alcohol    Comment: "last drink of alcohol ~ 1977"   Drug use: No    Comment: cutting back on chantix, 1 cigarette this morning   Sexual activity: Never  Other Topics Concern   Not on file  Social History Narrative   Lives alone in [place], [place]   Social Determinants of Health   Financial Resource Strain: Not on file  Food Insecurity: Not on file  Transportation Needs: No Transportation Needs (10/19/2021)   PRAPARE - Hydrologist (Medical): No    Lack of Transportation (Non-Medical): No  Physical Activity: Not on file  Stress: Not on  file  Social Connections: Not on file    Allergies  Allergen Reactions   Hctz [Hydrochlorothiazide] Other (See Comments)    Dizziness, syncope; does NOT wish to take anymore     Current Outpatient Medications:    acyclovir (ZOVIRAX) 400 MG tablet, TAKE 1 TABLET BY MOUTH THREE TIMES DAILY AS NEEDED FOR OUTBREAKS FOR SEVEN DAYS, Disp: 21 tablet, Rfl: 5   alendronate (FOSAMAX) 70 MG  tablet, TAKE 1 TABLET BY MOUTH ONCE WEEKLY ON SUNDAY take with a full glass of water on an empty stomach, Disp: 12 tablet, Rfl: 3   ALPRAZolam (XANAX) 0.5 MG tablet, TAKE 2 TO 3 TABLETS BY MOUTH AT BEDTIME AS NEEDED FOR anxiety/sleep, Disp: 90 tablet, Rfl: 1   amLODipine (NORVASC) 10 MG tablet, TAKE 1 TABLET BY MOUTH DAILY, Disp: 90 tablet, Rfl: 3   atorvastatin (LIPITOR) 10 MG tablet, TAKE 1 TABLET BY MOUTH AT BEDTIME, Disp: 90 tablet, Rfl: 3   atropine 1 % ophthalmic solution, Apply to eye., Disp: , Rfl:    benazepril (LOTENSIN) 40 MG tablet, Take 1 tablet (40 mg total) by mouth daily., Disp: 90 tablet, Rfl: 3   bictegravir-emtricitabine-tenofovir AF (BIKTARVY) 50-200-25 MG TABS tablet, Take 1 tablet by mouth daily., Disp: 30 tablet, Rfl: 11   brimonidine (ALPHAGAN) 0.2 % ophthalmic solution, USE 1 DROP IN [facility] EYE TWICE DAILY, Disp: , Rfl:    clopidogrel (PLAVIX) 75 MG tablet, TAKE 1 TABLET BY MOUTH DAILY, Disp: 30 tablet, Rfl: 3   COMBIGAN 0.2-0.5 % ophthalmic solution, Place 1 drop into the left eye 2 (two) times daily. , Disp: , Rfl:    diclofenac Sodium (VOLTAREN) 1 % GEL, APPLY TWO GRAMS TO AFFECTED AREA(S) FOUR TIMES DAILY, Disp: 350 g, Rfl: 2   dorzolamide (TRUSOPT) 2 % ophthalmic solution, Place 1 drop into both eyes 2 (two) times daily., Disp: , Rfl: 5   dorzolamide-timolol (COSOPT) 22.3-6.8 MG/ML ophthalmic solution, INSTILL ONE DROP IN [facility] EYE TWICE DAILY, Disp: , Rfl:    gabapentin (NEURONTIN) 300 MG capsule, Take 2 capsules (600 mg total) by mouth 3 (three) times daily., Disp: 540 capsule, Rfl: 3    latanoprost (XALATAN) 0.005 % ophthalmic solution, Administer 1 drop to both eyes nightly., Disp: , Rfl:    Multiple Vitamin (MULTIVITAMIN WITH MINERALS) TABS, Take 1 tablet by mouth daily., Disp: , Rfl:    NURTEC 75 MG TBDP, DISSOLVE ONE TABLET ON OR UNDER THE TONGUE EVERY OTHER DAY, Disp: 45 tablet, Rfl: 3   ondansetron (ZOFRAN-ODT) 4 MG disintegrating tablet, Take 4 mg by mouth every 6 (six) hours as needed for nausea or vomiting. , Disp: , Rfl:    oxyCODONE-acetaminophen (PERCOCET/ROXICET) 5-325 MG tablet, TAKE 1 TABLET BY MOUTH EVERY TWELVE HOURS AS NEEDED FOR PAIN, Disp: 60 tablet, Rfl: 0   prednisoLONE acetate (PRED FORTE) 1 % ophthalmic suspension, Place 1 drop into the right eye 3 (three) times daily., Disp: , Rfl:    terazosin (HYTRIN) 5 MG capsule, Take 1 capsule (5 mg total) by mouth at bedtime., Disp: 90 capsule, Rfl: 3   triamcinolone ointment (KENALOG) 0.1 %, APPLY TO THE AFFECTED AREA(S) TWICE DAILY (Patient taking differently: Apply 1 application  topically 2 (two) times daily as needed (rash).), Disp: 454 g, Rfl: 3    Review of Systems  Constitutional:  Negative for activity change, appetite change, chills, diaphoresis, fatigue, fever and unexpected weight change.  HENT:  Negative for congestion, rhinorrhea, sinus pressure, sneezing, sore throat and trouble swallowing.   Eyes:  Negative for photophobia and visual disturbance.  Respiratory:  Negative for cough, chest tightness, shortness of breath, wheezing and stridor.   Cardiovascular:  Negative for chest pain, palpitations and leg swelling.  Gastrointestinal:  Negative for abdominal distention, abdominal pain, anal bleeding, blood in stool, constipation, diarrhea, nausea and vomiting.  Genitourinary:  Negative for difficulty urinating, dysuria, flank pain and hematuria.  Musculoskeletal:  Negative for arthralgias, back pain, gait problem,  joint swelling and myalgias.  Skin:  Negative for color change, pallor, rash and wound.   Neurological:  Negative for dizziness, tremors, weakness and light-headedness.  Hematological:  Negative for adenopathy. Does not bruise/bleed easily.  Psychiatric/Behavioral:  Negative for agitation, behavioral problems, confusion, decreased concentration, dysphoric mood and sleep disturbance.        Objective:   Physical Exam Constitutional:      General: She is not in acute distress.    Appearance: Normal appearance. She is well-developed. She is not ill-appearing or diaphoretic.  HENT:     Head: Normocephalic and atraumatic.     Right Ear: Hearing and external ear normal.     Left Ear: Hearing and external ear normal.     Nose: No nasal deformity or rhinorrhea.  Eyes:     General: No scleral icterus.    Conjunctiva/sclera: Conjunctivae normal.     Right eye: Right conjunctiva is not injected.     Left eye: Left conjunctiva is not injected.     Pupils: Pupils are equal, round, and reactive to light.  Neck:     Vascular: No JVD.  Cardiovascular:     Rate and Rhythm: Normal rate and regular rhythm.     Heart sounds: S1 normal and S2 normal.  Pulmonary:     Effort: Pulmonary effort is normal. No respiratory distress.     Breath sounds: No wheezing.  Abdominal:     General: There is no distension.     Palpations: Abdomen is soft.  Musculoskeletal:        General: Normal range of motion.     Right shoulder: Normal.     Left shoulder: Normal.     Cervical back: Normal range of motion and neck supple.     Right hip: Normal.     Left hip: Normal.     Right knee: Normal.     Left knee: Normal.  Lymphadenopathy:     Head:     Right side of head: No submandibular, preauricular or posterior auricular adenopathy.     Left side of head: No submandibular, preauricular or posterior auricular adenopathy.     Cervical: No cervical adenopathy.     Right cervical: No superficial or deep cervical adenopathy.    Left cervical: No superficial or deep cervical adenopathy.  Skin:     General: Skin is warm and dry.     Coloration: Skin is not pale.     Findings: No abrasion, bruising, ecchymosis, erythema, lesion or rash.     Nails: There is no clubbing.  Neurological:     Mental Status: She is alert and oriented to person, place, and time. Mental status is at baseline.     Sensory: No sensory deficit.     Coordination: Coordination normal.     Gait: Gait normal.  Psychiatric:        Attention and Perception: She is attentive.        Mood and Affect: Mood normal.        Speech: Speech normal.        Behavior: Behavior normal. Behavior is cooperative.        Thought Content: Thought content normal.        Judgment: Judgment normal.             Assessment & Plan:  HIV disease:  I will add order HIV viral load CD4 count CBC with differential CMP, RPR GC and chlamydia and I will continue  Darlyn Chamber  [facility]  prescription  Hyperlipidemia:  Check a lipid panel and continue her atorvastatin  Hypertension:  Vascular disease: Would help if she can stop smoking altogether.  Opiate dependence followed by internal medicine   Anxiety followed internal medicine currently on Xanax as needed  Peripheral vascular disease defer to PCP vascular surgery continue optimization of blood pressure smoking cessation efforts statin  Seen counseling recommended updated COVID-19 vaccine as well as Shingrix but she will need to get at pharmacy

## 2022-03-27 ENCOUNTER — Ambulatory Visit: Payer: Medicare Other | Admitting: Infectious Disease

## 2022-03-27 DIAGNOSIS — M810 Age-related osteoporosis without current pathological fracture: Secondary | ICD-10-CM

## 2022-03-27 DIAGNOSIS — S8011XD Contusion of right lower leg, subsequent encounter: Secondary | ICD-10-CM

## 2022-03-27 DIAGNOSIS — B20 Human immunodeficiency virus [HIV] disease: Secondary | ICD-10-CM

## 2022-03-27 DIAGNOSIS — F11229 Opioid dependence with intoxication, unspecified: Secondary | ICD-10-CM

## 2022-03-27 DIAGNOSIS — A6000 Herpesviral infection of urogenital system, unspecified: Secondary | ICD-10-CM

## 2022-03-27 DIAGNOSIS — I10 Essential (primary) hypertension: Secondary | ICD-10-CM

## 2022-03-27 DIAGNOSIS — K5903 Drug induced constipation: Secondary | ICD-10-CM

## 2022-03-27 DIAGNOSIS — I739 Peripheral vascular disease, unspecified: Secondary | ICD-10-CM

## 2022-03-27 DIAGNOSIS — E7800 Pure hypercholesterolemia, unspecified: Secondary | ICD-10-CM

## 2022-03-27 DIAGNOSIS — E785 Hyperlipidemia, unspecified: Secondary | ICD-10-CM

## 2022-03-27 DIAGNOSIS — F17200 Nicotine dependence, unspecified, uncomplicated: Secondary | ICD-10-CM

## 2022-04-11 DIAGNOSIS — M79674 Pain in right toe(s): Secondary | ICD-10-CM | POA: Diagnosis not present

## 2022-04-11 DIAGNOSIS — M79672 Pain in left foot: Secondary | ICD-10-CM | POA: Diagnosis not present

## 2022-04-11 DIAGNOSIS — M79675 Pain in left toe(s): Secondary | ICD-10-CM | POA: Diagnosis not present

## 2022-04-11 DIAGNOSIS — M79671 Pain in right foot: Secondary | ICD-10-CM | POA: Diagnosis not present

## 2022-04-11 DIAGNOSIS — L110 Acquired keratosis follicularis: Secondary | ICD-10-CM | POA: Diagnosis not present

## 2022-04-11 DIAGNOSIS — I739 Peripheral vascular disease, unspecified: Secondary | ICD-10-CM | POA: Diagnosis not present

## 2022-04-12 ENCOUNTER — Other Ambulatory Visit: Payer: Self-pay | Admitting: Internal Medicine

## 2022-04-12 DIAGNOSIS — B029 Zoster without complications: Secondary | ICD-10-CM

## 2022-04-21 ENCOUNTER — Other Ambulatory Visit: Payer: Self-pay | Admitting: Internal Medicine

## 2022-04-23 ENCOUNTER — Other Ambulatory Visit: Payer: Self-pay | Admitting: Internal Medicine

## 2022-04-23 DIAGNOSIS — F411 Generalized anxiety disorder: Secondary | ICD-10-CM

## 2022-04-24 ENCOUNTER — Other Ambulatory Visit: Payer: Self-pay | Admitting: Internal Medicine

## 2022-05-16 NOTE — Telephone Encounter (Signed)
Last rx written 04/24/22. Last OV  12/30/21. Next OV  05/23/22. TOX  01/04/18.

## 2022-05-23 ENCOUNTER — Other Ambulatory Visit: Payer: Self-pay

## 2022-05-23 ENCOUNTER — Encounter: Payer: Self-pay | Admitting: Internal Medicine

## 2022-05-23 ENCOUNTER — Ambulatory Visit (INDEPENDENT_AMBULATORY_CARE_PROVIDER_SITE_OTHER): Payer: Medicare Other

## 2022-05-23 ENCOUNTER — Ambulatory Visit (INDEPENDENT_AMBULATORY_CARE_PROVIDER_SITE_OTHER): Payer: Medicare Other | Admitting: Internal Medicine

## 2022-05-23 VITALS — BP 146/64 | HR 60 | Temp 97.5°F | Ht 67.0 in | Wt 127.6 lb

## 2022-05-23 DIAGNOSIS — F17200 Nicotine dependence, unspecified, uncomplicated: Secondary | ICD-10-CM

## 2022-05-23 DIAGNOSIS — B20 Human immunodeficiency virus [HIV] disease: Secondary | ICD-10-CM

## 2022-05-23 DIAGNOSIS — M810 Age-related osteoporosis without current pathological fracture: Secondary | ICD-10-CM

## 2022-05-23 DIAGNOSIS — Z0000 Encounter for general adult medical examination without abnormal findings: Secondary | ICD-10-CM | POA: Diagnosis not present

## 2022-05-23 DIAGNOSIS — F411 Generalized anxiety disorder: Secondary | ICD-10-CM | POA: Diagnosis not present

## 2022-05-23 DIAGNOSIS — F17209 Nicotine dependence, unspecified, with unspecified nicotine-induced disorders: Secondary | ICD-10-CM

## 2022-05-23 DIAGNOSIS — E7800 Pure hypercholesterolemia, unspecified: Secondary | ICD-10-CM | POA: Diagnosis not present

## 2022-05-23 DIAGNOSIS — A6000 Herpesviral infection of urogenital system, unspecified: Secondary | ICD-10-CM

## 2022-05-23 DIAGNOSIS — I10 Essential (primary) hypertension: Secondary | ICD-10-CM

## 2022-05-23 DIAGNOSIS — I739 Peripheral vascular disease, unspecified: Secondary | ICD-10-CM

## 2022-05-23 DIAGNOSIS — F17210 Nicotine dependence, cigarettes, uncomplicated: Secondary | ICD-10-CM

## 2022-05-23 DIAGNOSIS — Z716 Tobacco abuse counseling: Secondary | ICD-10-CM

## 2022-05-23 DIAGNOSIS — I743 Embolism and thrombosis of arteries of the lower extremities: Secondary | ICD-10-CM

## 2022-05-23 DIAGNOSIS — G4700 Insomnia, unspecified: Secondary | ICD-10-CM

## 2022-05-23 DIAGNOSIS — F11229 Opioid dependence with intoxication, unspecified: Secondary | ICD-10-CM

## 2022-05-23 MED ORDER — OXYCODONE-ACETAMINOPHEN 5-325 MG PO TABS: ORAL_TABLET | ORAL | 0 refills | Status: DC

## 2022-05-23 MED ORDER — ALPRAZOLAM 0.5 MG PO TABS: ORAL_TABLET | ORAL | 1 refills | Status: DC

## 2022-05-23 NOTE — Patient Instructions (Signed)
[name] - -  Thank you for coming in today.   For your smoking, I would recommend stopping smoking to decrease your risk of further disease in your legs and decrease the risk of heart attack and stroke.  More information attached.   I would also recommend you get a CT scan for screening for lung cancer.  I will order this today.   Your mammogram and DEXA are ordered, please [facility] to schedule.    I have refilled your medications.    Thank you!  Come back to see me in 3-4 months.

## 2022-05-23 NOTE — Progress Notes (Unsigned)
Established Patient Office Visit  Subjective   Patient ID: Jessica [name], female    DOB: [birth year]  Age: 82 y.o. MRN: [id number]  Chief Complaint  Patient presents with   Follow-up    Jessica [name] is a 82 year old woman with PMH of PVD, arthritis with chronic pain and chronic opiate use, TUD, HIV, HLD, GAD/insomnia, chronic sinusitis, HTN who present for follow up.    She notes that her biggest issue is chronic pain.  She has a history of shoulder pain, DDD, OA, back pain.  She is on diclofenac, gabapentin and percocet (chronic dosing for a long time).  She uses a cane.  Due to glaucoma, she has difficulty with her vision.  Her fall risk is high at home.  She lives with family.  She has had 1 fall when a chair "broke" under her, but otherwise has not fallen.   Her other chronic issues are controlled at this time and she does not have any complaints.  She has osteoporosis, last DEXA was in 2017.  She has been off and on fosamax for quite a while.      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   Insomnia 10/20/2019   History of vitamin D deficiency 05/29/2018   Opiate dependence ([place]) 04/12/2018   Polypharmacy 99991111   Diastolic dysfunction 123XX123   Marijuana use 07/03/2016   Bilateral sensorineural hearing loss 06/11/2014   Subjective tinnitus of both ears 05/18/2014   Chronic sinusitis 04/02/2013   Right rotator cuff tear 02/01/2013   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   Generalized anxiety disorder 04/05/2012   Healthcare maintenance 04/05/2012   Hyperlipidemia 04/05/2012   Smoker 02/19/2012   History of small bowel obstruction 02/08/2012   Diverticulosis of colon without hemorrhage 02/08/2012   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[place])  11/01/2011   Constipation due to pain medication 04/27/2010   Genital herpes 07/20/2006   Glaucoma of left eye 07/20/2006   Essential hypertension 07/20/2006   Lumbar degenerative disc disease 07/20/2006   History of abnormal cervical Pap smear 07/20/2006   Human immunodeficiency virus disease ([place]) 03/27/1986      Review of Systems  Constitutional:  Negative for chills, fever, malaise/fatigue and weight loss.  Respiratory:  Negative for cough.   Cardiovascular:  Negative for chest pain, claudication and leg swelling.  Musculoskeletal:  Positive for back pain, falls (Most recent was a fall out of a chair on the left hip) and joint pain.  Neurological:  Negative for dizziness and focal weakness.      Objective:     BP (!) 146/64   Pulse 60   Temp (!) 97.5 F (36.4 C) (Oral)   Ht '5\' 7"'$  (1.702 m)   Wt 127 lb 9.6 oz (57.9 kg)   SpO2 97% Comment: RA  BMI 19.98 kg/m  BP Readings from Last 3 Encounters:  05/23/22 (!) 146/64  05/23/22 (!) 146/64  12/30/21 (!) 117/58   Wt Readings from Last 3 Encounters:  05/23/22 127 lb 9.6 oz (57.9 kg)  05/23/22 127 lb 9.6 oz (57.9 kg)  12/30/21 117 lb 8 oz (53.3 kg)      Physical Exam Vitals and nursing note reviewed.  Constitutional:      General: She is not in acute distress.  Appearance: Normal appearance. She is normal weight. She is not toxic-appearing.  HENT:     Head: Normocephalic and atraumatic.  Eyes:     Comments: Wearing glasses.   Cardiovascular:     Rate and Rhythm: Normal rate.  Pulmonary:     Effort: Pulmonary effort is normal. No respiratory distress.  Skin:    General: Skin is warm and dry.  Neurological:     Mental Status: She is alert and oriented to person, place, and time. Mental status is at baseline.  Psychiatric:        Mood and Affect: Mood normal.        Behavior: Behavior normal.       The 10-year ASCVD risk score (Arnett DK, et al., 2019) is: 34.3% - on atorvastatin    Assessment & Plan:    Problem List Items Addressed This Visit       Unprioritized   Human immunodeficiency virus disease ([place]) (Chronic)    Followed by ID.  Currently undetectable.  Continue biktarvy      Genital herpes (Chronic)    Continue acyclovir, treated by ID.       Essential hypertension (Chronic)    BP is high and on recheck SBP was 144.  She notes taking terazosin at night, amlodipine and benazepril in the morning which she did take this morning.  She has a low resting diastolic, no dizziness or chest pain today. Has been well controlled and had lower blood pressures on this regimen previously.   Plan Continue current therapy.       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HCC) (Chronic)    Bypass in 2009.  She has no pain in the legs at this time.  She is taking her plavix without issue.       Generalized anxiety disorder (Chronic)   Relevant Medications   ALPRAZolam (XANAX) 0.5 MG tablet   Hyperlipidemia (Chronic)    Last LDL was 46.  She is on atorvastatin.  Continue.  CMET at next visit.       Postmenopausal osteoporosis (Chronic)    She is due for repeat DEXA.  Will need to evaluate if she is due for a drug holiday after the exam as well.   Follow up after DEXA completed.       Smoker    She does continue to smoke.  She meets criteria for lung cancer screening given she is under 80 and continues to smoke, history of heavy smoking (> 15 pack years).  We discussed and she is willing to have this study done.   We spent about 10 minutes discussing cessation and I provided her resources.       Healthcare maintenance    Repeat DEXA.  Lung Cancer screening ordered. Reminded to schedule mammogram at the time of DEXA.       Opiate dependence ([place])    She has been on a stable dose for quite a while.  No red flags.  Given age, it would be appropriate to decrease this medication in future, particularly if side effects start to outweigh the benefits.  She has chronic pain, we discussed  the risks, and will continue for now.       Insomnia    She is on low dose alprazolam for many years for this, continue.  Monitor for side effects.       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  She has no pain in the  legs at this time.  She is taking her plavix without issue.       Other Visit Diagnoses     Tobacco use disorder, continuous    -  Primary   Relevant Orders   CT CHEST LUNG CA SCREEN LOW DOSE W/O CM   Tobacco abuse counseling       Relevant Orders   CT CHEST LUNG CA SCREEN LOW DOSE W/O CM   Encounter for smoking cessation counseling       Relevant Orders   CT CHEST LUNG CA SCREEN LOW DOSE W/O CM       Return in about 4 months (around 09/22/2022).    Gilles Chiquito, MD

## 2022-05-23 NOTE — Progress Notes (Signed)
Subjective:   Jessica [name] is a 82 y.o. female who presents for an Initial Medicare Annual Wellness Visit.  Review of Systems    Defer to PCP.        Objective:    Today's Vitals   05/23/22 1742 05/23/22 1744 05/23/22 1745  BP: (!) 166/58 (!) 146/64   Pulse: 66 60   Temp: (!) 97.5 F (36.4 C) (!) 97.5 F (36.4 C)   TempSrc: Oral Oral   SpO2: 97%    Weight: 127 lb 9.6 oz (57.9 kg)    Height: '5\' 7"'$  (1.702 m)    PainSc:   7    Body mass index is 19.98 kg/m.     05/23/2022    5:51 PM 05/23/2022    3:54 PM 12/30/2021    8:50 AM 07/21/2021    1:40 PM 06/03/2021    9:44 AM 10/15/2020   11:06 AM 08/04/2020    2:50 PM  Advanced Directives  Does Patient Have a Medical Advance Directive? Yes Yes Yes Yes Yes No Yes  Type of Paramedic of [place];Living will [place];Living will [place];Living will [place];Living will [place];Living will  [place];Living will  Does patient want to make changes to medical advance directive?  No - Patient declined No - Patient declined  No - Patient declined    Copy of [place] in Chart? Yes - validated most recent copy scanned in chart (See row information) Yes - validated most recent copy scanned in chart (See row information) Yes - validated most recent copy scanned in chart (See row information) Yes - validated most recent copy scanned in chart (See row information) Yes - validated most recent copy scanned in chart (See row information)  Yes - validated most recent copy scanned in chart (See row information)  Would patient like information on creating a medical advance directive?      No - Patient declined     Current Medications (verified) Outpatient Encounter Medications as of 05/23/2022  Medication Sig   acyclovir (ZOVIRAX) 400 MG tablet TAKE 1 TABLET BY MOUTH THREE TIMES DAILY AS NEEDED FOR  OUTBREAKS FOR SEVEN DAYS   alendronate (FOSAMAX) 70 MG tablet TAKE 1 TABLET BY MOUTH ONCE WEEKLY ON SUNDAY take with a full glass of water on an empty stomach   ALPRAZolam (XANAX) 0.5 MG tablet TAKE 2 TO 3 TABLETS BY MOUTH AT BEDTIME AS NEEDED FOR anxiety/sleep   amLODipine (NORVASC) 10 MG tablet TAKE 1 TABLET BY MOUTH DAILY   atorvastatin (LIPITOR) 10 MG tablet TAKE 1 TABLET BY MOUTH AT BEDTIME   atropine 1 % ophthalmic solution Apply to eye.   benazepril (LOTENSIN) 40 MG tablet Take 1 tablet (40 mg total) by mouth daily.   bictegravir-emtricitabine-tenofovir AF (BIKTARVY) 50-200-25 MG TABS tablet Take 1 tablet by mouth daily.   brimonidine (ALPHAGAN) 0.2 % ophthalmic solution USE 1 DROP IN EACH EYE TWICE DAILY   clopidogrel (PLAVIX) 75 MG tablet TAKE 1 TABLET BY MOUTH DAILY   COMBIGAN 0.2-0.5 % ophthalmic solution Place 1 drop into the left eye 2 (two) times daily.    diclofenac Sodium (VOLTAREN) 1 % GEL APPLY TWO GRAMS TO AFFECTED AREA(S) FOUR TIMES DAILY   dorzolamide (TRUSOPT) 2 % ophthalmic solution Place 1 drop into both eyes 2 (two) times daily.   dorzolamide-timolol (COSOPT) 22.3-6.8 MG/ML ophthalmic solution INSTILL ONE DROP IN [facility] EYE TWICE  DAILY   gabapentin (NEURONTIN) 300 MG capsule Take 2 capsules (600 mg total) by mouth 3 (three) times daily.   latanoprost (XALATAN) 0.005 % ophthalmic solution Administer 1 drop to both eyes nightly.   Multiple Vitamin (MULTIVITAMIN WITH MINERALS) TABS Take 1 tablet by mouth daily.   NURTEC 75 MG TBDP DISSOLVE ONE TABLET ON OR UNDER THE TONGUE EVERY OTHER DAY   ondansetron (ZOFRAN-ODT) 4 MG disintegrating tablet Take 4 mg by mouth every 6 (six) hours as needed for nausea or vomiting.    prednisoLONE acetate (PRED FORTE) 1 % ophthalmic suspension Place 1 drop into the right eye 3 (three) times daily.   terazosin (HYTRIN) 5 MG capsule Take 1 capsule (5 mg total) by mouth at bedtime.   triamcinolone ointment (KENALOG) 0.1 % APPLY TO THE AFFECTED  AREA(S) TWICE DAILY (Patient taking differently: Apply 1 application  topically 2 (two) times daily as needed (rash).)   No facility-administered encounter medications on file as of 05/23/2022.    Allergies (verified) Hctz [hydrochlorothiazide]   History: Past Medical History:  Diagnosis Date   Acute blood loss anemia 08/08/2020   Acute renal failure (ARF) ([place]) 08/17/2017   Anxiety 04/05/2012   Bilateral sensorineural hearing loss 06/11/2014   Mild to moderate on the left side and slight to mild on the right side per audiometry 05/2014.  Hearing aides with possible masking of tinnitus recommended but patient wished to defer secondary to finances.   Blood transfusion without reported diagnosis    pt denies   Bursitis of right shoulder 07/12/2012   s/p shoulder injection 07/12/2012    Cataract of right eye    REMOVED RIGHT EYE 4-19    Constipation due to pain medication 04/27/2010   Diverticulosis 02/08/2012   Extensive left-sided diverticula on colonoscopy March 2012 per Dr. Gala Romney    Essential hypertension 07/20/2006   Genital herpes 07/20/2006   Glaucoma of left eye 07/20/2006   Headache 10/20/2019   Heart murmur 1961   Human immunodeficiency virus disease ([place]) 03/27/1986   Hyperlipidemia LDL goal < 100 04/05/2012   Hypokalemia 04/12/2018   Insomnia 10/20/2019   Left hip pain 04/11/2021   Leg pain 04/11/2021   Long-term current use of opiate analgesic 03/17/2016   Loose stools 04/08/2019   Lumbar degenerative disc disease 07/20/2006   With chronic back pain    Marijuana use 07/03/2016   Micturition syncope 09/20/2015   Nausea and vomiting 04/08/2019   Opiate dependence ([place]) 04/12/2018   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   s/p aortobifem bypass 123XX123    Periumbilical hernia 123XX123   1 cm left periumbilical abdominal wall defect   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   DEXA 04/15/2012: L1-L4 spine T -3.9, Right femur T -3.0    Right rotator cuff tear 02/01/2013   Responds to  periodic steroid injections   Seborrhea 09/01/2010   Shoulder pain, right 12/04/2017   Small bowel obstruction due to adhesions ([place]) 02/08/2012   s/p Exploratory laparotomy, lysis of adhesions 02/12/12     Subjective tinnitus of both ears 05/18/2014   Tobacco abuse 02/19/2012   Tobacco abuse    Vasovagal syncope 02/15/2015   Vitamin D deficiency 05/29/2018   Vitamin D 18.96 (04/30/2018), treated with ergocalciferol 50,000 units PO QWk X 4 weeks   Voiding dysfunction    s/p cystoscopy and meatal dilation Dec 2005   Past Surgical History:  Procedure Laterality Date   ABDOMINAL HYSTERECTOMY     AORTO-FEMORAL BYPASS GRAFT  04/2007  APPENDECTOMY     BREAST SURGERY     Breast biopsy: negative   CHOLECYSTECTOMY     COLECTOMY  01/2011   Dr. Margot Chimes; "took out 12 inches of small intestiines and removed blockage"   COLONOSCOPY  2012   EYE SURGERY     EYE SURGERY  06/29/2020   06-29-2020- RIGHT CATARACT REMOVED AND LEFT EYE SURGERY TO REMOVE SAND LIKE SUBSTANCE    FEMORAL ARTERY EXPLORATION Left 07/10/2020   Procedure: REDO LEFT FEMORAL ARTERY EXPOSURE;  Surgeon: Serafina Mitchell, MD;  Location: [facility];  Service: Vascular;  Laterality: Left;   LAPAROTOMY  02/12/2012   Procedure: EXPLORATORY LAPAROTOMY;  Surgeon: Stark Klein, MD;  Location: [facility];  Service: General;  Laterality: N/A;  Exploratory Laparotomy, lysis of adhesions   SMALL INTESTINE SURGERY     THROMBECTOMY FEMORAL ARTERY Left 07/10/2020   Procedure: THROMBECTOMY AORTA-BIFEMORAL GRAFT, PROFUNDA, AND SUPERFICIAL FEMORAL ARTERY  LEFT LEG;  Surgeon: Serafina Mitchell, MD;  Location: [facility];  Service: Vascular;  Laterality: Left;   Family History  Problem Relation Age of Onset   Kidney failure Mother    Diabetes Mother    Hypertension Mother    Heart disease Mother    Glaucoma Father    Congestive Heart Failure Sister    Diabetes Sister    Kidney disease Sister    Diabetes Brother    Unexplained death Brother 62       Automobile accident    Hypothyroidism Daughter    Arthritis Daughter        Neck/Back   Healthy Son    HIV/AIDS Brother    HIV Daughter    Kidney disease Daughter    Arthritis Son        Knee   Colon cancer Neg Hx    Colon polyps Neg Hx    Esophageal cancer Neg Hx    Rectal cancer Neg Hx    Stomach cancer Neg Hx    Social History   Socioeconomic History   Marital status: Widowed    Spouse name: Not on file   Number of children: 4   Years of education: 2y college   Highest education level: Not on file  Occupational History   Occupation: retired    Comment: previously worked as a Designer, fashion/clothing for [employer]   Tobacco Use   Smoking status: Every Day    Packs/day: 0.30    Years: 50.00    Total pack years: 15.00    Types: Cigarettes    Passive exposure: Current   Smokeless tobacco: Never   Tobacco comments:    1/3 pk per day  Vaping Use   Vaping Use: Never used  Substance and Sexual Activity   Alcohol use: No    Alcohol/week: 0.0 standard drinks of alcohol    Comment: "last drink of alcohol ~ 1977"   Drug use: No    Comment: cutting back on chantix, 1 cigarette this morning   Sexual activity: Never  Other Topics Concern   Not on file  Social History Narrative   Lives alone in [place], [place]   Social Determinants of Health   Financial Resource Strain: Low Risk  (05/23/2022)   Overall Financial Resource Strain (CARDIA)    Difficulty of Paying Living Expenses: Not hard at all  Food Insecurity: No Food Insecurity (05/23/2022)   Hunger Vital Sign    Worried About Running Out of Food in the Last Year: Never true    Ran Out of Food  in the Last Year: Never true  Transportation Needs: No Transportation Needs (05/23/2022)   PRAPARE - Hydrologist (Medical): No    Lack of Transportation (Non-Medical): No  Physical Activity: Insufficiently Active (05/23/2022)   Exercise Vital Sign    Days of Exercise per Week: 1 day    Minutes of Exercise per Session: 10 min  Stress:  Stress Concern Present (05/23/2022)   [place]    Feeling of Stress : To some extent  Social Connections: Moderately Isolated (05/23/2022)   Social Connection and Isolation Panel [NHANES]    Frequency of Communication with Friends and Family: More than three times a week    Frequency of Social Gatherings with Friends and Family: More than three times a week    Attends Religious Services: 1 to 4 times per year    Active Member of [employer] or Organizations: No    Attends Archivist Meetings: Never    Marital Status: Widowed    Tobacco Counseling Ready to quit: Not Answered Counseling given: Not Answered Tobacco comments: 1/3 pk per day   Clinical Intake:  Pre-visit preparation completed: Yes  Pain : 0-10 Pain Score: 7  Pain Type: Chronic pain Pain Location: Hip Pain Orientation: Left Pain Descriptors / Indicators: Aching, Constant Pain Onset: More than a month ago Pain Frequency: Constant Pain Relieving Factors: oxycodone  Pain Relieving Factors: oxycodone  BMI - recorded: 19.98 Nutritional Status: BMI of 19-24  Normal Nutritional Risks: None Diabetes: No  How often do you need to have someone help you when you read instructions, pamphlets, or other written materials from your doctor or pharmacy?: 2 - Rarely What is the last grade level you completed in school?: college  Diabetic?NO  Interpreter Needed?: No  Information entered by :: Izumi Mixon, Cook 05/23/2022   Activities of Daily Living    05/23/2022    5:53 PM 05/23/2022    3:53 PM  In your present state of health, do you have any difficulty performing the following activities:  Hearing? 0 0  Vision? 1 1  Difficulty concentrating or making decisions? 0 0  Walking or climbing stairs? 1 1  Dressing or bathing? 1 1  Doing errands, shopping? 1 1  Preparing Food and eating ? N   Using the Toilet? N   In the past six months, have you  accidently leaked urine? N   Do you have problems with loss of bowel control? N   Managing your Medications? N   Managing your Finances? N   Housekeeping or managing your Housekeeping? N     Patient Care Team: Sid Falcon, MD as PCP - General (Internal Medicine) Tommy Medal, Lavell Islam, MD (Infectious Diseases)  Indicate any recent Medical Services you may have received from other than [facility] providers in the past year (date may be approximate).     Assessment:   This is a routine wellness examination for Jessica [name].  Hearing/Vision screen No results found.  Dietary issues and exercise activities discussed:     Goals Addressed   None   Depression Screen    05/23/2022    5:53 PM 05/23/2022    3:53 PM 12/30/2021    8:48 AM 10/10/2021    9:18 AM 07/21/2021    1:47 PM 06/03/2021    9:43 AM 04/11/2021   10:14 AM  PHQ 2/9 Scores  PHQ - 2 Score 0 0 0 0 0 0 0  Fall Risk    05/23/2022    5:51 PM 05/23/2022    3:53 PM 12/30/2021    8:48 AM 10/10/2021    9:18 AM 07/21/2021    1:39 PM  Fall Risk   Falls in the past year? '1 1 1 '$ 0 1  Number falls in past yr: '1 1 1  1  '$ Injury with Fall? '1 1 1  '$ 0  Risk for fall due to : Impaired balance/gait;Impaired mobility;Impaired vision History of fall(s);Impaired vision;Impaired mobility Impaired vision;Impaired mobility Impaired balance/gait;Impaired mobility History of fall(s);Impaired balance/gait  Follow up Falls evaluation completed;Falls prevention discussed Falls evaluation completed;Falls prevention discussed Falls evaluation completed;Falls prevention discussed Falls evaluation completed Falls evaluation completed;Falls prevention discussed    FALL RISK PREVENTION PERTAINING TO THE HOME:  Any stairs in or around the home? Yes  If so, are there any without handrails? No  Home free of loose throw rugs in walkways, pet beds, electrical cords, etc? Yes  Adequate lighting in your home to reduce risk of falls? Yes   ASSISTIVE DEVICES UTILIZED  TO PREVENT FALLS:  Life alert? Yes  Use of a cane, walker or w/c? Yes  Grab bars in the bathroom? No  Shower chair or bench in shower? Yes  Elevated toilet seat or a handicapped toilet? Yes   TIMED UP AND GO:  Was the test performed? No .  Length of time to ambulate 10 feet: n/a sec.   Gait steady and fast with assistive device  Cognitive Function:        05/23/2022    5:53 PM  6CIT Screen  What Year? 0 points  What month? 0 points  What time? 0 points  Count back from 20 0 points  Months in reverse 0 points  Repeat phrase 0 points  Total Score 0 points    Immunizations Immunization History  Administered Date(s) Administered   DTaP 11/25/2010   Fluad Quad(high Dose 65+) 12/30/2021   H1N1 01/17/2008   Hepatitis B 07/26/2000, 08/23/2000, 01/15/2001   Influenza Split 11/25/2010, 11/24/2011   Influenza Whole 01/05/2006, 12/20/2006, 11/23/2009   Influenza,inj,Quad PF,6+ Mos 11/14/2012, 11/20/2013, 11/19/2014, 12/10/2015, 12/06/2016, 11/23/2017   Influenza-Unspecified 11/23/2018   Moderna Sars-Covid-2 Vaccination 05/08/2019, 06/02/2019   PNEUMOCOCCAL CONJUGATE-20 04/11/2021   PPD Test 03/22/2000   Pfizer Covid-19 Vaccine Bivalent Booster 72yr & up 04/11/2021   Pneumococcal Conjugate-13 05/18/2014   Pneumococcal Polysaccharide-23 01/05/2006, 07/22/2011   Td 02/13/2008   Tdap 03/29/2018    TDAP status: Up to date  Flu Vaccine status: Up to date  Pneumococcal vaccine status: Up to date  Covid-19 vaccine status: Information provided on how to obtain vaccines.   Qualifies for Shingles Vaccine? Yes   Zostavax completed No   Shingrix Completed?: No.    Education has been provided regarding the importance of this vaccine. Patient has been advised to call insurance company to determine out of pocket expense if they have not yet received this vaccine. Advised may also receive vaccine at local pharmacy or Health Dept. Verbalized acceptance and understanding.  Screening  Tests Health Maintenance  Topic Date Due   Zoster Vaccines- Shingrix (1 of 2) Never done   MAMMOGRAM  05/05/2020   COVID-19 Vaccine (4 - 2023-24 season) 11/11/2021   Medicare Annual Wellness (AWV)  05/23/2023   DTaP/Tdap/Td (4 - Td or Tdap) 03/29/2028   Pneumonia Vaccine 80 Years old  Completed   INFLUENZA VACCINE  Completed   DEXA SCAN  Completed   Hepatitis C Screening  Completed  HPV VACCINES  Aged Out    Health Maintenance  Health Maintenance Due  Topic Date Due   Zoster Vaccines- Shingrix (1 of 2) Never done   MAMMOGRAM  05/05/2020   COVID-19 Vaccine (4 - 2023-24 season) 11/11/2021      Mammogram status: Ordered 07/21/2021. Pt provided with contact info and advised to call to schedule appt.   Bone Density status: Ordered 12/30/2021. Pt provided with contact info and advised to call to schedule appt.  Lung Cancer Screening: (Low Dose CT Chest recommended if Age 47-80 years, 30 pack-year currently smoking OR have quit w/in 15years.) does not qualify.   Lung Cancer Screening Referral: Defer to PCP.   Additional Screening:  Hepatitis C Screening: does qualify; Completed 06/29/2010  Vision Screening: Recommended annual ophthalmology exams for early detection of glaucoma and other disorders of the eye. Is the patient up to date with their annual eye exam?  Yes  Who is the provider or what is the name of the office in which the patient attends annual eye exams? [facility]  If pt is not established with a provider, would they like to be referred to a provider to establish care? No .   Dental Screening: Recommended annual dental exams for proper oral hygiene  Community Resource Referral / Chronic Care Management: CRR required this visit?  No   CCM required this visit?  No      Plan:     I have personally reviewed and noted the following in the patient's chart:   Medical and social history Use of alcohol, tobacco or illicit drugs  Current medications and  supplements including opioid prescriptions. Patient is currently taking opioid prescriptions. Information provided to patient regarding non-opioid alternatives. Patient advised to discuss non-opioid treatment plan with their provider. Functional ability and status Nutritional status Physical activity Advanced directives List of other physicians Hospitalizations, surgeries, and ER visits in previous 12 months Vitals Screenings to include cognitive, depression, and falls Referrals and appointments  In addition, I have reviewed and discussed with patient certain preventive protocols, quality metrics, and best practice recommendations. A written personalized care plan for preventive services as well as general preventive health recommendations were provided to patient.     Casee Knepp, CMA   05/23/2022   Nurse Notes: FACE TO FACE.  [name] , Thank you for taking time to come for your Medicare Wellness Visit. I appreciate your ongoing commitment to your health goals. Please review the following plan we discussed and let me know if I can assist you in the future.   These are the goals we discussed:  Goals      Blood Pressure < 140/90     LDL CALC < 100     Quit smoking / using tobacco        This is a list of the screening recommended for you and due dates:  Health Maintenance  Topic Date Due   Zoster (Shingles) Vaccine (1 of 2) Never done   Mammogram  05/05/2020   COVID-19 Vaccine (4 - 2023-24 season) 11/11/2021   Medicare Annual Wellness Visit  05/23/2023   DTaP/Tdap/Td vaccine (4 - Td or Tdap) 03/29/2028   Pneumonia Vaccine  Completed   Flu Shot  Completed   DEXA scan (bone density measurement)  Completed   Hepatitis C Screening: USPSTF Recommendation to screen - Ages 7-79 yo.  Completed   HPV Vaccine  Aged Out

## 2022-05-23 NOTE — Patient Instructions (Signed)

## 2022-05-25 NOTE — Assessment & Plan Note (Signed)
She is on low dose alprazolam for many years for this, continue.  Monitor for side effects.

## 2022-05-25 NOTE — Assessment & Plan Note (Signed)
Repeat DEXA.  Lung Cancer screening ordered. Reminded to schedule mammogram at the time of DEXA.

## 2022-05-25 NOTE — Assessment & Plan Note (Signed)
She is due for repeat DEXA.  Will need to evaluate if she is due for a drug holiday after the exam as well.   Follow up after DEXA completed.

## 2022-05-25 NOTE — Assessment & Plan Note (Signed)
She does continue to smoke.  She meets criteria for lung cancer screening given she is under 80 and continues to smoke, history of heavy smoking (> 15 pack years).  We discussed and she is willing to have this study done.   We spent about 10 minutes discussing cessation and I provided her resources.

## 2022-05-25 NOTE — Assessment & Plan Note (Signed)
Bypass in 2009.  She has no pain in the legs at this time.  She is taking her plavix without issue.

## 2022-05-25 NOTE — Assessment & Plan Note (Signed)
Followed by ID.  Currently undetectable.  Continue biktarvy

## 2022-05-25 NOTE — Assessment & Plan Note (Signed)
Last LDL was 46.  She is on atorvastatin.  Continue.  CMET at next visit.

## 2022-05-25 NOTE — Assessment & Plan Note (Signed)
She has been on a stable dose for quite a while.  No red flags.  Given age, it would be appropriate to decrease this medication in future, particularly if side effects start to outweigh the benefits.  She has chronic pain, we discussed the risks, and will continue for now.

## 2022-05-25 NOTE — Assessment & Plan Note (Signed)
BP is high and on recheck SBP was 144.  She notes taking terazosin at night, amlodipine and benazepril in the morning which she did take this morning.  She has a low resting diastolic, no dizziness or chest pain today. Has been well controlled and had lower blood pressures on this regimen previously.   Plan Continue current therapy.

## 2022-05-25 NOTE — Assessment & Plan Note (Signed)
Continue acyclovir, treated by ID.

## 2022-05-31 ENCOUNTER — Other Ambulatory Visit: Payer: Self-pay | Admitting: Internal Medicine

## 2022-05-31 DIAGNOSIS — I10 Essential (primary) hypertension: Secondary | ICD-10-CM

## 2022-05-31 DIAGNOSIS — H401133 Primary open-angle glaucoma, bilateral, severe stage: Secondary | ICD-10-CM | POA: Diagnosis not present

## 2022-06-05 ENCOUNTER — Ambulatory Visit: Payer: Medicare Other | Admitting: Infectious Disease

## 2022-06-09 ENCOUNTER — Ambulatory Visit (HOSPITAL_COMMUNITY)
Admission: RE | Admit: 2022-06-09 | Discharge: 2022-06-09 | Disposition: A | Discharge status: Home / Self Care | Payer: Medicare Other | Source: Ambulatory Visit | Attending: Internal Medicine | Admitting: Internal Medicine

## 2022-06-09 DIAGNOSIS — J432 Centrilobular emphysema: Secondary | ICD-10-CM | POA: Insufficient documentation

## 2022-06-09 DIAGNOSIS — R911 Solitary pulmonary nodule: Secondary | ICD-10-CM | POA: Insufficient documentation

## 2022-06-09 DIAGNOSIS — F17209 Nicotine dependence, unspecified, with unspecified nicotine-induced disorders: Secondary | ICD-10-CM | POA: Insufficient documentation

## 2022-06-09 DIAGNOSIS — I700 Atherosclerosis of aorta: Secondary | ICD-10-CM | POA: Diagnosis not present

## 2022-06-09 DIAGNOSIS — Z122 Encounter for screening for malignant neoplasm of respiratory organs: Secondary | ICD-10-CM | POA: Diagnosis not present

## 2022-06-09 DIAGNOSIS — Z716 Tobacco abuse counseling: Secondary | ICD-10-CM | POA: Insufficient documentation

## 2022-06-09 DIAGNOSIS — J9809 Other diseases of bronchus, not elsewhere classified: Secondary | ICD-10-CM | POA: Diagnosis not present

## 2022-06-09 DIAGNOSIS — F17210 Nicotine dependence, cigarettes, uncomplicated: Secondary | ICD-10-CM | POA: Diagnosis not present

## 2022-06-15 ENCOUNTER — Ambulatory Visit (INDEPENDENT_AMBULATORY_CARE_PROVIDER_SITE_OTHER): Payer: Medicare Other

## 2022-06-15 ENCOUNTER — Ambulatory Visit: Payer: Medicare Other | Admitting: Infectious Disease

## 2022-06-15 ENCOUNTER — Other Ambulatory Visit: Payer: Self-pay

## 2022-06-15 ENCOUNTER — Encounter: Payer: Self-pay | Admitting: Infectious Disease

## 2022-06-15 VITALS — BP 131/71 | HR 61 | Temp 97.5°F | Wt 128.0 lb

## 2022-06-15 DIAGNOSIS — B20 Human immunodeficiency virus [HIV] disease: Secondary | ICD-10-CM

## 2022-06-15 DIAGNOSIS — F11229 Opioid dependence with intoxication, unspecified: Secondary | ICD-10-CM | POA: Diagnosis not present

## 2022-06-15 DIAGNOSIS — F17200 Nicotine dependence, unspecified, uncomplicated: Secondary | ICD-10-CM | POA: Diagnosis not present

## 2022-06-15 DIAGNOSIS — Z23 Encounter for immunization: Secondary | ICD-10-CM | POA: Diagnosis not present

## 2022-06-15 DIAGNOSIS — A6000 Herpesviral infection of urogenital system, unspecified: Secondary | ICD-10-CM

## 2022-06-15 DIAGNOSIS — I10 Essential (primary) hypertension: Secondary | ICD-10-CM

## 2022-06-15 DIAGNOSIS — I739 Peripheral vascular disease, unspecified: Secondary | ICD-10-CM | POA: Diagnosis not present

## 2022-06-15 DIAGNOSIS — Z7185 Encounter for immunization safety counseling: Secondary | ICD-10-CM

## 2022-06-15 DIAGNOSIS — E7800 Pure hypercholesterolemia, unspecified: Secondary | ICD-10-CM

## 2022-06-15 MED ORDER — BIKTARVY 50-200-25 MG PO TABS: 1.0000 | ORAL_TABLET | Freq: Every day | ORAL | 11 refills | Status: DC

## 2022-06-15 NOTE — Progress Notes (Signed)
Subjective:  Chief complaint: Left-sided hip pain  Patient ID: Jessica [name], female    DOB: 17-Dec-1940, 82 y.o.   MRN: [id number]  HPI  Jessica [name] is a 82 y.o. female with HIV infection who has done superbly well on her antiviral regimen, changed from Atripla to [facility] and DESCOVY -->BIKTARVY with reasonable virological suppression.  He has comorbid smoking coronary artery disease peripheral vascular disease hyperlipidemia hypertension.    There continues have problems with her hip pain and is currently on oxycodone.  She does continue to smoke which I have counseled her to stop.         Past Medical History:  Diagnosis Date   Acute blood loss anemia 08/08/2020   Acute renal failure (ARF) 08/17/2017   Anxiety 04/05/2012   Bilateral sensorineural hearing loss 06/11/2014   Mild to moderate on the left side and slight to mild on the right side per audiometry 05/2014.  Hearing aides with possible masking of tinnitus recommended but patient wished to defer secondary to finances.   Blood transfusion without reported diagnosis    pt denies   Bursitis of right shoulder 07/12/2012   s/p shoulder injection 07/12/2012    Cataract of right eye    REMOVED RIGHT EYE 4-19    Constipation due to pain medication 04/27/2010   Diverticulosis 02/08/2012   Extensive left-sided diverticula on colonoscopy March 2012 per Dr. Gala Romney    Essential hypertension 07/20/2006   Genital herpes 07/20/2006   Glaucoma of left eye 07/20/2006   Headache 10/20/2019   Heart murmur 1961   Human immunodeficiency virus disease 03/27/1986   Hyperlipidemia LDL goal < 100 04/05/2012   Hypokalemia 04/12/2018   Insomnia 10/20/2019   Left hip pain 04/11/2021   Leg pain 04/11/2021   Long-term current use of opiate analgesic 03/17/2016   Loose stools 04/08/2019   Lumbar degenerative disc disease 07/20/2006   With chronic back pain    Marijuana use 07/03/2016   Micturition syncope 09/20/2015   Nausea and vomiting  04/08/2019   Opiate dependence 04/12/2018   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   s/p aortobifem bypass 123XX123    Periumbilical hernia 123XX123   1 cm left periumbilical abdominal wall defect   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   DEXA 04/15/2012: L1-L4 spine T -3.9, Right femur T -3.0    Right rotator cuff tear 02/01/2013   Responds to periodic steroid injections   Seborrhea 09/01/2010   Shoulder pain, right 12/04/2017   Small bowel obstruction due to adhesions 02/08/2012   s/p Exploratory laparotomy, lysis of adhesions 02/12/12     Subjective tinnitus of both ears 05/18/2014   Tobacco abuse 02/19/2012   Tobacco abuse    Vasovagal syncope 02/15/2015   Vitamin D deficiency 05/29/2018   Vitamin D 18.96 (04/30/2018), treated with ergocalciferol 50,000 units PO QWk X 4 weeks   Voiding dysfunction    s/p cystoscopy and meatal dilation Dec 2005    Past Surgical History:  Procedure Laterality Date   ABDOMINAL HYSTERECTOMY     AORTO-FEMORAL BYPASS GRAFT  04/2007   APPENDECTOMY     BREAST SURGERY     Breast biopsy: negative   CHOLECYSTECTOMY     COLECTOMY  01/2011   Dr. Margot Chimes; "took out 12 inches of small intestiines and removed blockage"   COLONOSCOPY  2012   EYE SURGERY     EYE SURGERY  06/29/2020   06-29-2020- RIGHT CATARACT REMOVED AND LEFT EYE SURGERY TO REMOVE SAND LIKE SUBSTANCE  FEMORAL ARTERY EXPLORATION Left 07/10/2020   Procedure: REDO LEFT FEMORAL ARTERY EXPOSURE;  Surgeon: Serafina Mitchell, MD;  Location: [facility];  Service: Vascular;  Laterality: Left;   LAPAROTOMY  02/12/2012   Procedure: EXPLORATORY LAPAROTOMY;  Surgeon: Stark Klein, MD;  Location: [facility];  Service: General;  Laterality: N/A;  Exploratory Laparotomy, lysis of adhesions   SMALL INTESTINE SURGERY     THROMBECTOMY FEMORAL ARTERY Left 07/10/2020   Procedure: THROMBECTOMY AORTA-BIFEMORAL GRAFT, PROFUNDA, AND SUPERFICIAL FEMORAL ARTERY  LEFT LEG;  Surgeon: Serafina Mitchell, MD;  Location: [facility];  Service:  Vascular;  Laterality: Left;    Family History  Problem Relation Age of Onset   Kidney failure Mother    Diabetes Mother    Hypertension Mother    Heart disease Mother    Glaucoma Father    Congestive Heart Failure Sister    Diabetes Sister    Kidney disease Sister    Diabetes Brother    Unexplained death Brother 37       Automobile accident   Hypothyroidism Daughter    Arthritis Daughter        Neck/Back   Healthy Son    HIV/AIDS Brother    HIV Daughter    Kidney disease Daughter    Arthritis Son        Knee   Colon cancer Neg Hx    Colon polyps Neg Hx    Esophageal cancer Neg Hx    Rectal cancer Neg Hx    Stomach cancer Neg Hx       Social History   Socioeconomic History   Marital status: Widowed    Spouse name: Not on file   Number of children: 4   Years of education: 2y college   Highest education level: Not on file  Occupational History   Occupation: retired    Comment: previously worked as a Designer, fashion/clothing for [employer]   Tobacco Use   Smoking status: Every Day    Packs/day: 0.30    Years: 50.00    Additional pack years: 0.00    Total pack years: 15.00    Types: Cigarettes    Passive exposure: Current   Smokeless tobacco: Never   Tobacco comments:    1/3 pk per day  Vaping Use   Vaping Use: Never used  Substance and Sexual Activity   Alcohol use: No    Alcohol/week: 0.0 standard drinks of alcohol    Comment: "last drink of alcohol ~ 1977"   Drug use: No    Comment: cutting back on chantix, 1 cigarette this morning   Sexual activity: Never  Other Topics Concern   Not on file  Social History Narrative   Lives alone in [place], [place]   Social Determinants of Health   Financial Resource Strain: Low Risk  (05/23/2022)   Overall Financial Resource Strain (CARDIA)    Difficulty of Paying Living Expenses: Not hard at all  Food Insecurity: No Food Insecurity (05/23/2022)   Hunger Vital Sign    Worried About Running Out of Food in the Last Year: Never true     Ran Out of Food in the Last Year: Never true  Transportation Needs: No Transportation Needs (05/23/2022)   PRAPARE - Hydrologist (Medical): No    Lack of Transportation (Non-Medical): No  Physical Activity: Insufficiently Active (05/23/2022)   Exercise Vital Sign    Days of Exercise per Week: 1 day    Minutes of Exercise  per Session: 10 min  Stress: Stress Concern Present (05/23/2022)   [place]    Feeling of Stress : To some extent  Social Connections: Moderately Isolated (05/23/2022)   Social Connection and Isolation Panel [NHANES]    Frequency of Communication with Friends and Family: More than three times a week    Frequency of Social Gatherings with Friends and Family: More than three times a week    Attends Religious Services: 1 to 4 times per year    Active Member of [employer] or Organizations: No    Attends Archivist Meetings: Never    Marital Status: Widowed    Allergies  Allergen Reactions   Hctz [Hydrochlorothiazide] Other (See Comments)    Dizziness, syncope; does NOT wish to take anymore     Current Outpatient Medications:    acyclovir (ZOVIRAX) 400 MG tablet, TAKE 1 TABLET BY MOUTH THREE TIMES DAILY AS NEEDED FOR OUTBREAKS FOR SEVEN DAYS, Disp: 21 tablet, Rfl: 5   alendronate (FOSAMAX) 70 MG tablet, TAKE 1 TABLET BY MOUTH ONCE WEEKLY ON SUNDAY take with a full glass of water on an empty stomach, Disp: 12 tablet, Rfl: 3   ALPRAZolam (XANAX) 0.5 MG tablet, TAKE 2 TO 3 TABLETS BY MOUTH AT BEDTIME AS NEEDED FOR anxiety/sleep, Disp: 90 tablet, Rfl: 1   amLODipine (NORVASC) 10 MG tablet, TAKE 1 TABLET BY MOUTH DAILY, Disp: 90 tablet, Rfl: 3   atorvastatin (LIPITOR) 10 MG tablet, TAKE 1 TABLET BY MOUTH AT BEDTIME, Disp: 90 tablet, Rfl: 3   atropine 1 % ophthalmic solution, Apply to eye., Disp: , Rfl:    benazepril (LOTENSIN) 40 MG tablet, TAKE 1 TABLET BY MOUTH EVERY DAY,  Disp: 90 tablet, Rfl: 3   bictegravir-emtricitabine-tenofovir AF (BIKTARVY) 50-200-25 MG TABS tablet, Take 1 tablet by mouth daily., Disp: 30 tablet, Rfl: 11   brimonidine (ALPHAGAN) 0.2 % ophthalmic solution, USE 1 DROP IN [facility] EYE TWICE DAILY, Disp: , Rfl:    clopidogrel (PLAVIX) 75 MG tablet, TAKE 1 TABLET BY MOUTH DAILY, Disp: 30 tablet, Rfl: 3   COMBIGAN 0.2-0.5 % ophthalmic solution, Place 1 drop into the left eye 2 (two) times daily. , Disp: , Rfl:    diclofenac Sodium (VOLTAREN) 1 % GEL, APPLY TWO GRAMS TO AFFECTED AREA(S) FOUR TIMES DAILY, Disp: 350 g, Rfl: 2   dorzolamide (TRUSOPT) 2 % ophthalmic solution, Place 1 drop into both eyes 2 (two) times daily., Disp: , Rfl: 5   dorzolamide-timolol (COSOPT) 22.3-6.8 MG/ML ophthalmic solution, INSTILL ONE DROP IN [facility] EYE TWICE DAILY, Disp: , Rfl:    gabapentin (NEURONTIN) 300 MG capsule, Take 2 capsules (600 mg total) by mouth 3 (three) times daily., Disp: 540 capsule, Rfl: 3   latanoprost (XALATAN) 0.005 % ophthalmic solution, Administer 1 drop to both eyes nightly., Disp: , Rfl:    Multiple Vitamin (MULTIVITAMIN WITH MINERALS) TABS, Take 1 tablet by mouth daily., Disp: , Rfl:    NURTEC 75 MG TBDP, DISSOLVE ONE TABLET ON OR UNDER THE TONGUE EVERY OTHER DAY, Disp: 45 tablet, Rfl: 3   ondansetron (ZOFRAN-ODT) 4 MG disintegrating tablet, Take 4 mg by mouth every 6 (six) hours as needed for nausea or vomiting. , Disp: , Rfl:    oxyCODONE-acetaminophen (PERCOCET/ROXICET) 5-325 MG tablet, TAKE 1 TABLET BY MOUTH EVERY TWELVE HOURS AS NEEDED FOR PAIN, Disp: 60 tablet, Rfl: 0   prednisoLONE acetate (PRED FORTE) 1 % ophthalmic suspension, Place 1 drop into the right eye  3 (three) times daily., Disp: , Rfl:    terazosin (HYTRIN) 5 MG capsule, Take 1 capsule (5 mg total) by mouth at bedtime., Disp: 90 capsule, Rfl: 3   triamcinolone ointment (KENALOG) 0.1 %, APPLY TO THE AFFECTED AREA(S) TWICE DAILY (Patient taking differently: Apply 1 application  topically  2 (two) times daily as needed (rash).), Disp: 454 g, Rfl: 3    Review of Systems  Constitutional:  Negative for activity change, appetite change, chills, diaphoresis, fatigue, fever and unexpected weight change.  HENT:  Negative for congestion, rhinorrhea, sinus pressure, sneezing, sore throat and trouble swallowing.   Eyes:  Negative for photophobia and visual disturbance.  Respiratory:  Negative for cough, chest tightness, shortness of breath, wheezing and stridor.   Cardiovascular:  Negative for chest pain, palpitations and leg swelling.  Gastrointestinal:  Negative for abdominal distention, abdominal pain, anal bleeding, blood in stool, constipation, diarrhea, nausea and vomiting.  Genitourinary:  Negative for difficulty urinating, dysuria, flank pain and hematuria.  Musculoskeletal:  Positive for arthralgias. Negative for back pain, gait problem, joint swelling and myalgias.  Skin:  Negative for color change, pallor, rash and wound.  Neurological:  Negative for dizziness, tremors, weakness and light-headedness.  Hematological:  Negative for adenopathy. Does not bruise/bleed easily.  Psychiatric/Behavioral:  Negative for agitation, behavioral problems, confusion, decreased concentration, dysphoric mood and sleep disturbance.        Objective:   Physical Exam Constitutional:      General: She is not in acute distress.    Appearance: Normal appearance. She is well-developed. She is not ill-appearing or diaphoretic.  HENT:     Head: Normocephalic and atraumatic.     Right Ear: Hearing and external ear normal.     Left Ear: Hearing and external ear normal.     Nose: No nasal deformity or rhinorrhea.  Eyes:     General: No scleral icterus.    Conjunctiva/sclera: Conjunctivae normal.     Right eye: Right conjunctiva is not injected.     Left eye: Left conjunctiva is not injected.     Pupils: Pupils are equal, round, and reactive to light.  Neck:     Vascular: No JVD.   Cardiovascular:     Rate and Rhythm: Normal rate and regular rhythm.     Heart sounds: S1 normal and S2 normal.  Pulmonary:     Effort: Pulmonary effort is normal. No respiratory distress.     Breath sounds: No wheezing.  Abdominal:     General: There is no distension.     Palpations: Abdomen is soft.  Musculoskeletal:        General: Normal range of motion.     Right shoulder: Normal.     Left shoulder: Normal.     Cervical back: Normal range of motion and neck supple.     Right hip: Normal.     Left hip: Normal.     Right knee: Normal.     Left knee: Normal.  Lymphadenopathy:     Head:     Right side of head: No submandibular, preauricular or posterior auricular adenopathy.     Left side of head: No submandibular, preauricular or posterior auricular adenopathy.     Cervical: No cervical adenopathy.     Right cervical: No superficial or deep cervical adenopathy.    Left cervical: No superficial or deep cervical adenopathy.  Skin:    General: Skin is warm and dry.     Coloration: Skin is not pale.  Findings: No abrasion, bruising, ecchymosis, erythema, lesion or rash.     Nails: There is no clubbing.  Neurological:     General: No focal deficit present.     Mental Status: She is alert and oriented to person, place, and time.     Sensory: No sensory deficit.     Coordination: Coordination normal.     Gait: Gait normal.  Psychiatric:        Attention and Perception: She is attentive.        Mood and Affect: Mood normal.        Speech: Speech normal.        Behavior: Behavior normal. Behavior is cooperative.        Thought Content: Thought content normal.        Judgment: Judgment normal.             Assessment & Plan:   HIV disease:  I will add order HIV viral load CD4 count CBC with differential CMP, RPR GC and chlamydia and I will continue  Rodney Booze Manns's Biktarvy, prescription  HTN:  Continue benazepril amlodipine.  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  Pain management is per Gilles Chiquito with internal medicine.  Opiate dependence see above  Vaccine counseling recommended updated COVID-19 vaccine which we gave her today

## 2022-06-16 LAB — T-HELPER CELLS (CD4) COUNT (NOT AT ARMC)
CD4 % Helper T Cell: 41 % (ref 33–65)
CD4 T Cell Abs: 1139 /uL (ref 400–1790)

## 2022-06-17 LAB — CBC WITH DIFFERENTIAL/PLATELET
Absolute Monocytes: 230 cells/uL (ref 200–950)
Basophils Absolute: 39 cells/uL (ref 0–200)
Basophils Relative: 0.7 %
Eosinophils Absolute: 50 cells/uL (ref 15–500)
Eosinophils Relative: 0.9 %
HCT: 38.6 % (ref 35.0–45.0)
Hemoglobin: 13.1 g/dL (ref 11.7–15.5)
Lymphs Abs: 2783.2 cells/uL (ref 850–3900)
MCH: 32 pg (ref 27.0–33.0)
MCHC: 33.9 g/dL (ref 32.0–36.0)
MCV: 94.1 fL (ref 80.0–100.0)
MPV: 9.5 fL (ref 7.5–12.5)
Monocytes Relative: 4.1 %
Neutro Abs: 2498 cells/uL (ref 1500–7800)
Neutrophils Relative %: 44.6 %
Platelets: 300 10*3/uL (ref 140–400)
RBC: 4.1 10*6/uL (ref 3.80–5.10)
RDW: 12.1 % (ref 11.0–15.0)
Total Lymphocyte: 49.7 %
WBC: 5.6 10*3/uL (ref 3.8–10.8)

## 2022-06-17 LAB — LIPID PANEL
Cholesterol: 135 mg/dL (ref <200)
HDL: 76 mg/dL (ref >=50)
LDL Cholesterol (Calc): 45 mg/dL (calc)
Non-HDL Cholesterol (Calc): 59 mg/dL (calc) (ref <130)
Total CHOL/HDL Ratio: 1.8 (calc) (ref <5.0)
Triglycerides: 67 mg/dL (ref <150)

## 2022-06-17 LAB — COMPLETE METABOLIC PANEL WITH GFR
AG Ratio: 1.4 (calc) (ref 1.0–2.5)
ALT: 8 U/L (ref 6–29)
AST: 16 U/L (ref 10–35)
Albumin: 4.3 g/dL (ref 3.6–5.1)
Alkaline phosphatase (APISO): 74 U/L (ref 37–153)
BUN: 12 mg/dL (ref 7–25)
CO2: 23 mmol/L (ref 20–32)
Calcium: 9.2 mg/dL (ref 8.6–10.4)
Chloride: 111 mmol/L — ABNORMAL HIGH (ref 98–110)
Creat: 0.68 mg/dL (ref 0.60–1.00)
Globulin: 3 g/dL (calc) (ref 1.9–3.7)
Glucose, Bld: 105 mg/dL — ABNORMAL HIGH (ref 65–99)
Potassium: 3.4 mmol/L — ABNORMAL LOW (ref 3.5–5.3)
Sodium: 142 mmol/L (ref 135–146)
Total Bilirubin: 0.4 mg/dL (ref 0.2–1.2)
Total Protein: 7.3 g/dL (ref 6.1–8.1)
eGFR: 89 mL/min/{1.73_m2} (ref >=60)

## 2022-06-17 LAB — HIV-1 RNA QUANT-NO REFLEX-BLD
HIV 1 RNA Quant: NOT DETECTED Copies/mL
HIV-1 RNA Quant, Log: NOT DETECTED Log cps/mL

## 2022-06-17 LAB — RPR: RPR Ser Ql: NONREACTIVE

## 2022-07-03 ENCOUNTER — Other Ambulatory Visit: Payer: Self-pay | Admitting: Internal Medicine

## 2022-07-11 ENCOUNTER — Other Ambulatory Visit: Payer: Self-pay

## 2022-07-11 ENCOUNTER — Inpatient Hospital Stay (HOSPITAL_COMMUNITY)
Admission: EM | Admit: 2022-07-11 | Discharge: 2022-07-13 | DRG: 392 | Disposition: A | Discharge status: Home / Self Care | Payer: Medicare Other | Attending: Family Medicine | Admitting: Family Medicine

## 2022-07-11 ENCOUNTER — Emergency Department (HOSPITAL_COMMUNITY): Payer: Medicare Other

## 2022-07-11 ENCOUNTER — Encounter (HOSPITAL_COMMUNITY): Payer: Self-pay | Admitting: Emergency Medicine

## 2022-07-11 ENCOUNTER — Other Ambulatory Visit: Payer: Self-pay | Admitting: Internal Medicine

## 2022-07-11 DIAGNOSIS — I10 Essential (primary) hypertension: Secondary | ICD-10-CM | POA: Diagnosis not present

## 2022-07-11 DIAGNOSIS — E785 Hyperlipidemia, unspecified: Secondary | ICD-10-CM | POA: Diagnosis present

## 2022-07-11 DIAGNOSIS — E559 Vitamin D deficiency, unspecified: Secondary | ICD-10-CM | POA: Diagnosis not present

## 2022-07-11 DIAGNOSIS — Z7902 Long term (current) use of antithrombotics/antiplatelets: Secondary | ICD-10-CM | POA: Diagnosis not present

## 2022-07-11 DIAGNOSIS — R197 Diarrhea, unspecified: Secondary | ICD-10-CM

## 2022-07-11 DIAGNOSIS — E876 Hypokalemia: Secondary | ICD-10-CM | POA: Diagnosis present

## 2022-07-11 DIAGNOSIS — K5909 Other constipation: Secondary | ICD-10-CM | POA: Diagnosis present

## 2022-07-11 DIAGNOSIS — Z7982 Long term (current) use of aspirin: Secondary | ICD-10-CM

## 2022-07-11 DIAGNOSIS — K6389 Other specified diseases of intestine: Secondary | ICD-10-CM | POA: Diagnosis not present

## 2022-07-11 DIAGNOSIS — G4489 Other headache syndrome: Secondary | ICD-10-CM | POA: Diagnosis not present

## 2022-07-11 DIAGNOSIS — K5792 Diverticulitis of intestine, part unspecified, without perforation or abscess without bleeding: Secondary | ICD-10-CM | POA: Diagnosis not present

## 2022-07-11 DIAGNOSIS — G4700 Insomnia, unspecified: Secondary | ICD-10-CM | POA: Diagnosis present

## 2022-07-11 DIAGNOSIS — K5732 Diverticulitis of large intestine without perforation or abscess without bleeding: Secondary | ICD-10-CM | POA: Diagnosis not present

## 2022-07-11 DIAGNOSIS — Z7983 Long term (current) use of bisphosphonates: Secondary | ICD-10-CM | POA: Diagnosis not present

## 2022-07-11 DIAGNOSIS — B20 Human immunodeficiency virus [HIV] disease: Secondary | ICD-10-CM | POA: Diagnosis not present

## 2022-07-11 DIAGNOSIS — Z841 Family history of disorders of kidney and ureter: Secondary | ICD-10-CM

## 2022-07-11 DIAGNOSIS — F411 Generalized anxiety disorder: Secondary | ICD-10-CM

## 2022-07-11 DIAGNOSIS — R296 Repeated falls: Secondary | ICD-10-CM | POA: Diagnosis not present

## 2022-07-11 DIAGNOSIS — I739 Peripheral vascular disease, unspecified: Secondary | ICD-10-CM | POA: Diagnosis present

## 2022-07-11 DIAGNOSIS — R2689 Other abnormalities of gait and mobility: Secondary | ICD-10-CM | POA: Diagnosis not present

## 2022-07-11 DIAGNOSIS — K529 Noninfective gastroenteritis and colitis, unspecified: Secondary | ICD-10-CM | POA: Diagnosis not present

## 2022-07-11 DIAGNOSIS — Z79899 Other long term (current) drug therapy: Secondary | ICD-10-CM

## 2022-07-11 DIAGNOSIS — R531 Weakness: Secondary | ICD-10-CM

## 2022-07-11 DIAGNOSIS — F419 Anxiety disorder, unspecified: Secondary | ICD-10-CM | POA: Diagnosis present

## 2022-07-11 DIAGNOSIS — Z888 Allergy status to other drugs, medicaments and biological substances status: Secondary | ICD-10-CM

## 2022-07-11 DIAGNOSIS — R1111 Vomiting without nausea: Secondary | ICD-10-CM | POA: Diagnosis not present

## 2022-07-11 DIAGNOSIS — Z833 Family history of diabetes mellitus: Secondary | ICD-10-CM

## 2022-07-11 DIAGNOSIS — M25552 Pain in left hip: Secondary | ICD-10-CM

## 2022-07-11 DIAGNOSIS — N289 Disorder of kidney and ureter, unspecified: Secondary | ICD-10-CM | POA: Diagnosis not present

## 2022-07-11 DIAGNOSIS — Z8249 Family history of ischemic heart disease and other diseases of the circulatory system: Secondary | ICD-10-CM | POA: Diagnosis not present

## 2022-07-11 DIAGNOSIS — M810 Age-related osteoporosis without current pathological fracture: Secondary | ICD-10-CM

## 2022-07-11 DIAGNOSIS — Z720 Tobacco use: Secondary | ICD-10-CM | POA: Diagnosis not present

## 2022-07-11 DIAGNOSIS — Z743 Need for continuous supervision: Secondary | ICD-10-CM | POA: Diagnosis not present

## 2022-07-11 DIAGNOSIS — S0990XA Unspecified injury of head, initial encounter: Secondary | ICD-10-CM | POA: Diagnosis not present

## 2022-07-11 DIAGNOSIS — Z21 Asymptomatic human immunodeficiency virus [HIV] infection status: Secondary | ICD-10-CM | POA: Diagnosis present

## 2022-07-11 LAB — COMPREHENSIVE METABOLIC PANEL
ALT: 7 U/L (ref 0–44)
AST: 18 U/L (ref 15–41)
Albumin: 3.5 g/dL (ref 3.5–5.0)
Alkaline Phosphatase: 66 U/L (ref 38–126)
Anion gap: 9 (ref 5–15)
BUN: 11 mg/dL (ref 8–23)
CO2: 21 mmol/L — ABNORMAL LOW (ref 22–32)
Calcium: 8.9 mg/dL (ref 8.9–10.3)
Chloride: 108 mmol/L (ref 98–111)
Creatinine, Ser: 0.65 mg/dL (ref 0.44–1.00)
GFR, Estimated: >60 mL/min (ref >60)
Glucose, Bld: 115 mg/dL — ABNORMAL HIGH (ref 70–99)
Potassium: 3 mmol/L — ABNORMAL LOW (ref 3.5–5.1)
Sodium: 138 mmol/L (ref 135–145)
Total Bilirubin: 0.6 mg/dL (ref 0.3–1.2)
Total Protein: 7.6 g/dL (ref 6.5–8.1)

## 2022-07-11 LAB — LIPASE, BLOOD: Lipase: 29 U/L (ref 11–51)

## 2022-07-11 LAB — CBC
HCT: 39.4 % (ref 36.0–46.0)
Hemoglobin: 13.3 g/dL (ref 12.0–15.0)
MCH: 32.4 pg (ref 26.0–34.0)
MCHC: 33.8 g/dL (ref 30.0–36.0)
MCV: 96.1 fL (ref 80.0–100.0)
Platelets: 302 10*3/uL (ref 150–400)
RBC: 4.1 MIL/uL (ref 3.87–5.11)
RDW: 13.2 % (ref 11.5–15.5)
WBC: 6.6 10*3/uL (ref 4.0–10.5)
nRBC: 0 % (ref 0.0–0.2)

## 2022-07-11 LAB — MAGNESIUM: Magnesium: 2.1 mg/dL (ref 1.7–2.4)

## 2022-07-11 MED ORDER — ALPRAZOLAM 1 MG PO TABS
1.0000 mg | ORAL_TABLET | Freq: Every evening | ORAL | Status: DC | PRN
  Administered 2022-07-11: 1 mg via ORAL
  Filled 2022-07-11: qty 1

## 2022-07-11 MED ORDER — MORPHINE SULFATE (PF) 2 MG/ML IV SOLN
2.0000 mg | INTRAVENOUS | Status: DC | PRN
  Administered 2022-07-12 – 2022-07-13 (×3): 2 mg via INTRAVENOUS
  Filled 2022-07-11 (×3): qty 1

## 2022-07-11 MED ORDER — POTASSIUM CHLORIDE IN NACL 40-0.9 MEQ/L-% IV SOLN: INTRAVENOUS | Status: DC

## 2022-07-11 MED ORDER — IOHEXOL 300 MG/ML  SOLN
100.0000 mL | Freq: Once | INTRAMUSCULAR | Status: AC | PRN
  Administered 2022-07-11: 100 mL via INTRAVENOUS

## 2022-07-11 MED ORDER — TERAZOSIN HCL 5 MG PO CAPS
5.0000 mg | ORAL_CAPSULE | Freq: Every day | ORAL | Status: DC
  Administered 2022-07-11 – 2022-07-12 (×2): 5 mg via ORAL
  Filled 2022-07-11 (×2): qty 1

## 2022-07-11 MED ORDER — POTASSIUM CHLORIDE 20 MEQ PO PACK
40.0000 meq | PACK | Freq: Once | ORAL | Status: AC
  Administered 2022-07-11: 40 meq via ORAL
  Filled 2022-07-11: qty 2

## 2022-07-11 MED ORDER — HYDRALAZINE HCL 20 MG/ML IJ SOLN
10.0000 mg | Freq: Once | INTRAMUSCULAR | Status: AC
  Administered 2022-07-11: 10 mg via INTRAVENOUS
  Filled 2022-07-11: qty 1

## 2022-07-11 MED ORDER — ENOXAPARIN SODIUM 40 MG/0.4ML IJ SOSY
40.0000 mg | PREFILLED_SYRINGE | INTRAMUSCULAR | Status: DC
  Administered 2022-07-11 – 2022-07-12 (×2): 40 mg via SUBCUTANEOUS
  Filled 2022-07-11 (×2): qty 0.4

## 2022-07-11 MED ORDER — CLOPIDOGREL BISULFATE 75 MG PO TABS
75.0000 mg | ORAL_TABLET | Freq: Every day | ORAL | Status: DC
  Administered 2022-07-12 – 2022-07-13 (×2): 75 mg via ORAL
  Filled 2022-07-11 (×2): qty 1

## 2022-07-11 MED ORDER — GABAPENTIN 300 MG PO CAPS
600.0000 mg | ORAL_CAPSULE | Freq: Three times a day (TID) | ORAL | Status: DC
  Administered 2022-07-11 – 2022-07-13 (×5): 600 mg via ORAL
  Filled 2022-07-11 (×5): qty 2

## 2022-07-11 MED ORDER — POTASSIUM CHLORIDE 10 MEQ/100ML IV SOLN
10.0000 meq | Freq: Once | INTRAVENOUS | Status: AC
  Administered 2022-07-11: 10 meq via INTRAVENOUS
  Filled 2022-07-11: qty 100

## 2022-07-11 MED ORDER — BICTEGRAVIR-EMTRICITAB-TENOFOV 50-200-25 MG PO TABS
1.0000 | ORAL_TABLET | Freq: Every day | ORAL | Status: DC
  Administered 2022-07-12 – 2022-07-13 (×2): 1 via ORAL
  Filled 2022-07-11 (×3): qty 1

## 2022-07-11 MED ORDER — DORZOLAMIDE HCL-TIMOLOL MAL 2-0.5 % OP SOLN
1.0000 [drp] | Freq: Two times a day (BID) | OPHTHALMIC | Status: DC
  Administered 2022-07-11 – 2022-07-13 (×4): 1 [drp] via OPHTHALMIC
  Filled 2022-07-11: qty 10

## 2022-07-11 MED ORDER — BRIMONIDINE TARTRATE-TIMOLOL 0.2-0.5 % OP SOLN: 1.0000 [drp] | Freq: Two times a day (BID) | OPHTHALMIC | Status: DC

## 2022-07-11 MED ORDER — METRONIDAZOLE 500 MG/100ML IV SOLN
500.0000 mg | Freq: Once | INTRAVENOUS | Status: AC
  Administered 2022-07-11: 500 mg via INTRAVENOUS
  Filled 2022-07-11: qty 100

## 2022-07-11 MED ORDER — SODIUM CHLORIDE 0.9 % IV SOLN
2.0000 g | Freq: Once | INTRAVENOUS | Status: AC
  Administered 2022-07-11: 2 g via INTRAVENOUS
  Filled 2022-07-11: qty 20

## 2022-07-11 MED ORDER — HYDRALAZINE HCL 20 MG/ML IJ SOLN: 10.0000 mg | INTRAMUSCULAR | Status: DC | PRN

## 2022-07-11 MED ORDER — ONDANSETRON HCL 4 MG PO TABS: 4.0000 mg | ORAL_TABLET | Freq: Four times a day (QID) | ORAL | Status: DC | PRN

## 2022-07-11 MED ORDER — SODIUM CHLORIDE 0.9 % IV SOLN
2.0000 g | INTRAVENOUS | Status: DC
  Administered 2022-07-12: 2 g via INTRAVENOUS
  Filled 2022-07-11: qty 20

## 2022-07-11 MED ORDER — METRONIDAZOLE 500 MG/100ML IV SOLN
500.0000 mg | Freq: Two times a day (BID) | INTRAVENOUS | Status: DC
  Administered 2022-07-12 – 2022-07-13 (×3): 500 mg via INTRAVENOUS
  Filled 2022-07-11 (×3): qty 100

## 2022-07-11 MED ORDER — MELATONIN 5 MG PO TABS
5.0000 mg | ORAL_TABLET | Freq: Every day | ORAL | Status: DC
  Filled 2022-07-11 (×3): qty 1

## 2022-07-11 MED ORDER — ONDANSETRON HCL 4 MG/2ML IJ SOLN: 4.0000 mg | Freq: Four times a day (QID) | INTRAMUSCULAR | Status: DC | PRN

## 2022-07-11 MED ORDER — DM-GUAIFENESIN ER 30-600 MG PO TB12: 1.0000 | ORAL_TABLET | Freq: Two times a day (BID) | ORAL | Status: DC | PRN

## 2022-07-11 MED ORDER — ACETAMINOPHEN 650 MG RE SUPP: 650.0000 mg | Freq: Four times a day (QID) | RECTAL | Status: DC | PRN

## 2022-07-11 MED ORDER — BENAZEPRIL HCL 20 MG PO TABS
40.0000 mg | ORAL_TABLET | Freq: Every day | ORAL | Status: DC
  Administered 2022-07-12 – 2022-07-13 (×2): 40 mg via ORAL
  Filled 2022-07-11 (×2): qty 2

## 2022-07-11 MED ORDER — ACETAMINOPHEN 325 MG PO TABS
650.0000 mg | ORAL_TABLET | Freq: Four times a day (QID) | ORAL | Status: DC | PRN
  Administered 2022-07-13: 650 mg via ORAL
  Filled 2022-07-11: qty 2

## 2022-07-11 MED ORDER — ASPIRIN 81 MG PO TBEC
81.0000 mg | DELAYED_RELEASE_TABLET | Freq: Every day | ORAL | Status: DC
  Administered 2022-07-12 – 2022-07-13 (×2): 81 mg via ORAL
  Filled 2022-07-11 (×2): qty 1

## 2022-07-11 MED ORDER — LACTATED RINGERS IV BOLUS
1000.0000 mL | Freq: Once | INTRAVENOUS | Status: AC
  Administered 2022-07-11: 1000 mL via INTRAVENOUS

## 2022-07-11 NOTE — ED Provider Notes (Signed)
[place] EMERGENCY DEPARTMENT AT [facility] Provider Note   CSN: [id number] Arrival date & time: 07/11/22  1332     History  Chief Complaint  Patient presents with   Nasal Congestion   Abdominal Pain   Back Pain   Nausea    Jessica [name] is a 82 y.o. female with past medical history as outlined below presents to the ED from home with complaint of diarrhea, weakness, and falls.  Patient has had 4 falls in the last 24 hours.  She is also confused, has lightheadedness, and is having difficulty walking per daughter at bedside.  Patient unable to recall falls or circumstances surrounding them.  Patient has had multiple episodes of diarrhea and has been unable to make it to the bathroom every time.  Patient normally walks at home unassisted and uses a cane when outside of the house.  She has LLQ abdominal pain that has been ongoing for the past 5 days.  Denies fever, chills, melena, nausea, vomiting, dizziness, loss of consciousness.  Patient has not had any recent prescription medication changes, but has been taking guaifenesin/dextromethorphan cough medicine and using phenylephrine nose spray.  Patient's daughter concerned for possible drug interactions due to patient being on opiate and benzodiazepine.   Past Medical History:  Diagnosis Date   Acute blood loss anemia 08/08/2020   Acute renal failure (ARF) (HCC) 08/17/2017   Anxiety 04/05/2012   Bilateral sensorineural hearing loss 06/11/2014   Mild to moderate on the left side and slight to mild on the right side per audiometry 05/2014.  Hearing aides with possible masking of tinnitus recommended but patient wished to defer secondary to finances.   Blood transfusion without reported diagnosis    pt denies   Bursitis of right shoulder 07/12/2012   s/p shoulder injection 07/12/2012    Cataract of right eye    REMOVED RIGHT EYE 4-19    Constipation due to pain medication 04/27/2010   Diverticulosis 02/08/2012   Extensive  left-sided diverticula on colonoscopy March 2012 per Dr. Jena Gauss    Essential hypertension 07/20/2006   Genital herpes 07/20/2006   Glaucoma of left eye 07/20/2006   Headache 10/20/2019   Heart murmur 1961   Human immunodeficiency virus disease (HCC) 03/27/1986   Hyperlipidemia LDL goal < 100 04/05/2012   Hypokalemia 04/12/2018   Insomnia 10/20/2019   Left hip pain 04/11/2021   Leg pain 04/11/2021   Long-term current use of opiate analgesic 03/17/2016   Loose stools 04/08/2019   Lumbar degenerative disc disease 07/20/2006   With chronic back pain    Marijuana use 07/03/2016   Micturition syncope 09/20/2015   Nausea and vomiting 04/08/2019   Opiate dependence (HCC) 04/12/2018   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   s/p aortobifem bypass 2009    Periumbilical hernia 05/18/2014   1 cm left periumbilical abdominal wall defect   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   DEXA 04/15/2012: L1-L4 spine T -3.9, Right femur T -3.0    Right rotator cuff tear 02/01/2013   Responds to periodic steroid injections   Seborrhea 09/01/2010   Shoulder pain, right 12/04/2017   Small bowel obstruction due to adhesions (HCC) 02/08/2012   s/p Exploratory laparotomy, lysis of adhesions 02/12/12     Subjective tinnitus of both ears 05/18/2014   Tobacco abuse 02/19/2012   Tobacco abuse    Vasovagal syncope 02/15/2015   Vitamin D deficiency 05/29/2018   Vitamin D 18.96 (04/30/2018), treated with ergocalciferol 50,000 units PO QWk X  4 weeks   Voiding dysfunction    s/p cystoscopy and meatal dilation Dec 2005         Home Medications Prior to Admission medications   Medication Sig Start Date End Date Taking? Authorizing Provider  acyclovir (ZOVIRAX) 400 MG tablet TAKE 1 TABLET BY MOUTH THREE TIMES DAILY AS NEEDED FOR OUTBREAKS FOR SEVEN DAYS Patient taking differently: Take 400 mg by mouth 3 (three) times daily as needed. For severe outbreaks for 7 days 04/12/22  Yes Inez Catalina, MD  alendronate (FOSAMAX) 70 MG  tablet TAKE 1 TABLET BY MOUTH ONCE WEEKLY ON SUNDAY take with a full glass of water on an empty stomach Patient taking differently: Take 70 mg by mouth once a week. 03/16/22  Yes Inez Catalina, MD  ALPRAZolam Prudy Feeler) 0.5 MG tablet TAKE 2 TO 3 TABLETS BY MOUTH AT BEDTIME AS NEEDED FOR anxiety/sleep 05/23/22  Yes Inez Catalina, MD  ammonium lactate (AMLACTIN) 12 % cream Apply topically. 05/15/22  Yes [provider]  aspirin EC 81 MG tablet Take 1 tablet by mouth daily.   Yes [provider]  atorvastatin (LIPITOR) 10 MG tablet TAKE 1 TABLET BY MOUTH AT BEDTIME 10/04/21  Yes Inez Catalina, MD  benazepril (LOTENSIN) 40 MG tablet TAKE 1 TABLET BY MOUTH EVERY DAY 05/31/22  Yes Inez Catalina, MD  bictegravir-emtricitabine-tenofovir AF (BIKTARVY) 50-200-25 MG TABS tablet Take 1 tablet by mouth daily. 06/15/22  Yes Daiva Eves, Lisette Grinder, MD  clopidogrel (PLAVIX) 75 MG tablet TAKE 1 TABLET BY MOUTH DAILY 07/03/22  Yes Inez Catalina, MD  COMBIGAN 0.2-0.5 % ophthalmic solution Place 1 drop into the left eye 2 (two) times daily. 01/06/13  Yes [provider]  dextromethorphan-guaiFENesin (MUCINEX DM) 30-600 MG 12hr tablet Take 1-2 tablets by mouth 2 (two) times daily as needed for cough.   Yes [provider]  diclofenac Sodium (VOLTAREN) 1 % GEL APPLY TWO GRAMS TO AFFECTED AREA(S) FOUR TIMES DAILY Patient taking differently: Apply 2 g topically 4 (four) times daily. 06/06/21  Yes Inez Catalina, MD  dorzolamide-timolol (COSOPT) 22.3-6.8 MG/ML ophthalmic solution Place 1 drop into both eyes 2 (two) times daily. 03/09/21  Yes [provider]  gabapentin (NEURONTIN) 300 MG capsule Take 2 capsules (600 mg total) by mouth 3 (three) times daily. 12/30/21 12/30/22 Yes Inez Catalina, MD  melatonin 5 MG TABS Take 5 mg by mouth at bedtime.   Yes [provider]  Multiple Vitamin (MULTIVITAMIN WITH MINERALS) TABS Take 1 tablet by mouth daily.   Yes [provider]  oxyCODONE-acetaminophen (PERCOCET/ROXICET) 5-325 MG tablet TAKE 1 TABLET BY MOUTH EVERY TWELVE HOURS AS NEEDED FOR PAIN 05/23/22  Yes Inez Catalina, MD  phenylephrine (NEO-SYNEPHRINE) 1 % nasal spray Place 1 drop into both nostrils every 6 (six) hours as needed for congestion.   Yes [provider]  psyllium (REGULOID) 0.52 g capsule Take 0.52 g by mouth daily.   Yes [provider]  terazosin (HYTRIN) 5 MG capsule Take 1 capsule (5 mg total) by mouth at bedtime. 07/21/21  Yes Demaio, Alexa, MD  triamcinolone ointment (KENALOG) 0.1 % APPLY TO THE AFFECTED AREA(S) TWICE DAILY Patient taking differently: Apply 1 application  topically 2 (two) times daily as needed (rash). 07/05/20  Yes Inez Catalina, MD  acetaminophen (TYLENOL) 500 MG tablet Take by mouth.    [provider]  amLODipine (NORVASC) 10 MG tablet TAKE 1 TABLET BY MOUTH DAILY 12/01/21  Inez Catalina, MD  brimonidine (ALPHAGAN) 0.2 % ophthalmic solution USE 1 DROP IN [facility] EYE TWICE DAILY 04/01/21   [provider]      Allergies    Hctz [hydrochlorothiazide]    Review of Systems   Review of Systems  Constitutional:  Negative for chills and fever.  Gastrointestinal:  Positive for abdominal pain and diarrhea. Negative for blood in stool, nausea and vomiting.  Musculoskeletal:  Positive for back pain.  Neurological:  Positive for weakness and light-headedness. Negative for dizziness and syncope.    Physical Exam Updated Vital Signs BP (!) 193/79   Pulse 68   Temp 98.2 F (36.8 C) (Oral)   Resp 18   SpO2 95%  Physical Exam Vitals and nursing note reviewed.  Constitutional:      General: She is not in acute distress.    Appearance: She is ill-appearing. She is not toxic-appearing or diaphoretic.  HENT:     Head: Normocephalic and atraumatic.     Mouth/Throat:     Mouth: Mucous membranes are moist.     Pharynx: Oropharynx is clear.  Cardiovascular:     Rate and Rhythm:  Normal rate and regular rhythm.     Pulses: Normal pulses.          Dorsalis pedis pulses are 2+ on the right side and 2+ on the left side.     Heart sounds: Normal heart sounds.  Pulmonary:     Effort: Pulmonary effort is normal. No respiratory distress.     Breath sounds: Normal breath sounds and air entry.  Abdominal:     General: Abdomen is flat. Bowel sounds are normal. There is no distension.     Palpations: Abdomen is soft.     Tenderness: There is abdominal tenderness in the left lower quadrant.     Hernia: No hernia is present.  Musculoskeletal:     Cervical back: Full passive range of motion without pain.     Right lower leg: No edema.     Left lower leg: No edema.  Skin:    General: Skin is warm and dry.     Capillary Refill: Capillary refill takes less than 2 seconds.  Neurological:     General: No focal deficit present.     Mental Status: She is alert and oriented to person, place, and time. Mental status is at baseline.     GCS: GCS eye subscore is 4. GCS verbal subscore is 5. GCS motor subscore is 6.     Sensory: Sensation is intact.     Motor: Motor function is intact.     Comments: Patient moves all extremities appropriately while lying in bed.  She is able to answer all questions appropriately.  Sensation intact in upper and lower extremities.  4/5 strength in bilateral lower extremities.  Unable to assess gait due to patient's increased weakness with ambulating.    Psychiatric:        Mood and Affect: Mood normal.        Behavior: Behavior normal.     ED Results / Procedures / Treatments   Labs (all labs ordered are listed, but only abnormal results are displayed) Labs Reviewed  COMPREHENSIVE METABOLIC PANEL - Abnormal; Notable for the following components:      Result Value   Potassium 3.0 (*)    CO2 21 (*)    Glucose, Bld 115 (*)    All other components within normal limits  LIPASE, BLOOD  CBC  URINALYSIS,  ROUTINE W REFLEX MICROSCOPIC     EKG None  Radiology CT ABDOMEN PELVIS W CONTRAST  Result Date: 07/11/2022 CLINICAL DATA:  Left lower quadrant abdominal pain EXAM: CT ABDOMEN AND PELVIS WITH CONTRAST TECHNIQUE: Multidetector CT imaging of the abdomen and pelvis was performed using the standard protocol following bolus administration of intravenous contrast. RADIATION DOSE REDUCTION: This exam was performed according to the departmental dose-optimization program which includes automated exposure control, adjustment of the mA and/or kV according to patient size and/or use of iterative reconstruction technique. CONTRAST:  OMNIPAQUE IOHEXOL 300 MG/ML  SOLN COMPARISON:  CT abdomen and pelvis 09/06/2013 FINDINGS: Lower chest: Bibasilar atelectasis/scarring. Cardiomegaly. No acute abnormality. Hepatobiliary: Cholecystectomy. Prominent intra and extrahepatic bile ducts likely due to reservoir effect. Unremarkable liver. Pancreas: Unremarkable. Spleen: Unremarkable. Adrenals/Urinary Tract: Stable adrenal glands. Bilateral cortical renal scarring. Low-attenuation lesions in the kidneys are statistically likely to represent cysts. No follow-up is required. No obstructing urinary calculi or hydronephrosis. Unremarkable bladder for degree of distention. Stomach/Bowel: Extensive colonic diverticulosis. There is mild wall thickening and adjacent fluid and stranding about the sigmoid colon. Additional wall thickening and mucosal hyperenhancement of the right colon with stranding and fluid about the cecum. No evidence of obstruction. Stomach is within normal limits. Patulous small bowel anastomosis in the left abdomen. Fecalization of the terminal ileum. Appendectomy. Small hiatal hernia. 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 This is age indeterminate but new since the most recent comparison in 72. Predominantly calcified plaque in  the mid SMA causes moderate and possibly severe narrowing. No lymphadenopathy. Reproductive: Hysterectomy. Other: Small volume free fluid in the pelvis and about the cecum. No free intraperitoneal air. Musculoskeletal: No acute osseous abnormality. IMPRESSION: 1. Wall thickening and mucosal hyperenhancement of the right colon and sigmoid colon with adjacent fluid and stranding. Findings are favored to represent infectious or inflammatory colitis/diverticulitis. No abscess or perforation. 2. Postoperative change of aortobifemoral bypass graft. 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 This is age indeterminate and may be chronic but new since the most recent comparison in 2015. 3. 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 4. Fecalization of the terminal ileum compatible with slow transit/constipation. Critical Value/emergent results were called by telephone at the time of interpretation on 07/11/2022 at 4:25 pm to provider Eber Hong, MD, who verbally acknowledged these results. Electronically Signed   By: Minerva Fester M.D.   On: 07/11/2022 16:26   CT Head Wo Contrast  Result Date: 07/11/2022 CLINICAL DATA:  Trauma EXAM: CT HEAD WITHOUT CONTRAST TECHNIQUE: Contiguous axial images were obtained from the base of the skull through the vertex without intravenous contrast. RADIATION DOSE REDUCTION: This exam was performed according to the departmental dose-optimization program which includes automated exposure control, adjustment of the mA and/or kV according to patient size and/or use of iterative reconstruction technique. COMPARISON:  CT Head 04/08/18 FINDINGS: Brain: No evidence of acute infarction, hemorrhage, hydrocephalus, extra-axial collection or mass lesion/mass effect. 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 Vascular: No hyperdense vessel or unexpected calcification. Skull: Normal. Negative for fracture or focal lesion.  Sinuses/Orbits: No middle ear or mastoid effusion. Paranasal sinuses are clear. Right lens replacement and glaucoma drainage catheter. Orbits are otherwise unremarkable. Other: None. IMPRESSION: No CT evidence of intracranial injury Electronically Signed   By: Lorenza Cambridge M.D.   On: 07/11/2022 16:09    Procedures Procedures    Medications  Ordered in ED Medications  cefTRIAXone (ROCEPHIN) 2 g in sodium chloride 0.9 % 100 mL IVPB (0 g Intravenous Stopped 07/11/22 1819)    And  metroNIDAZOLE (FLAGYL) IVPB 500 mg (500 mg Intravenous New Bag/Given 07/11/22 1804)  potassium chloride 10 mEq in 100 mL IVPB (10 mEq Intravenous New Bag/Given 07/11/22 1810)  lactated ringers bolus 1,000 mL (1,000 mLs Intravenous New Bag/Given 07/11/22 1525)  iohexol (OMNIPAQUE) 300 MG/ML solution 100 mL (100 mLs Intravenous Contrast Given 07/11/22 1546)    ED Course/ Medical Decision Making/ A&P                             Medical Decision Making Amount and/or Complexity of Data Reviewed Labs: ordered. Radiology: ordered.  Risk Prescription drug management.   This patient presents to the ED with chief complaint(s) of diarrhea, weakness, confusion, LLQ abdominal pain with pertinent past medical history of HIV, HTN, PVD, tobacco abuse, HLD, diverticulosis, opiate dependence, marijuana abuse.  The complaint involves an extensive differential diagnosis and also carries with it a high risk of complications and morbidity.    The differential diagnosis includes diverticulitis, acute gastroenteritis, gastritis, colitis, dehydration, electrolyte disturbance, intracranial abnormality    The initial plan is to obtain baseline labs and imaging  Additional history obtained: Additional history obtained from family, patient's daughter at bedside provides majority of HPI due to patient not being able to recall all events surrounding her illness or falls Records reviewed  - patient is seen by infectious disease who manages her  HIV medications.  Last values were not detected on blood work from 06/15/22    Initial Assessment:   Exam significant for ill-appearing patient who is not in acute distress.  She is resting comfortably in bed.  Abdomen is soft and tender in the LLQ without distension or appreciable hernias.  Skin is warm and dry with good color.  Heart rate is normal with regular rhythm.  Lungs clear to auscultation bilaterally.  She is normocephalic, atraumatic with normal ROM of her cervical spine.  4/5 strength in bilateral lower extremities.  Normal sensation in all extremities.  Bilateral DP pulses 2+.  Independent ECG/labs interpretation:  The following labs were independently interpreted:  CBC without leukocytosis or anemia.  Metabolic panel with hypokalemia, but no other electrolyte disturbance.  Hepatic function and renal function both unremarkable.  Lipase not indicative of pancreatitis.   Independent visualization and interpretation of imaging: I independently visualized the following imaging with scope of interpretation limited to determining acute life threatening conditions related to emergency care: CT head, which revealed no evidence of skull fracture or intracranial abnormality.  CT abdomen pelvis with evidence of colitis/diverticulitis.  I agree with radiologist interpretation.   Treatment and Reassessment: Will start patient on IV antibiotics for treatment of diverticulitis.  I feel that patient would benefit from hospital admission at this time given her diverticulitis, diarrhea, increased weakness associated with falls.   Consultations obtained:   I requested consultation with on-call hospitalist and spoke with Kennis Carina who agreed with hospital admission.   Disposition:   Patient to be admitted to hospital for management of diverticulitis and increased weakness when ambulating.     Social Determinants of Health:   Patient's  tobacco abuse, marijuana use   increases the complexity of  managing their presentation         Final Clinical Impression(s) / ED Diagnoses Final diagnoses:  Diverticulitis  Increased weakness when ambulating  Diarrhea, unspecified type    Rx / DC Orders ED Discharge Orders     None         Barrie Lyme 07/11/22 1832    Eber Hong, MD 07/11/22 [id number]

## 2022-07-11 NOTE — H&P (Signed)
History and Physical    Jessica [name] QIO:[id number] DOB: [birth year] DOA: 07/11/2022  PCP: Inez Catalina, MD   Patient coming from: Home  I have personally briefly reviewed patient's old medical records in [facility]  Chief Complaint: Diarrhea, abdominal Pain  HPI: Jessica [name] is a 82 y.o. female with medical history significant for peripheral vascular disease, hypertension, HIV, anxiety. Patient was brought to the ED with reports of nausea, vomiting that started today, diarrhea and back pain started about 3 days ago.  Also reports onset of left lower abdominal pain started about 5 days ago.  Patient has fallen 4 times since yesterday.  Daughter at bedside report that patient was confused yesterday.  Today she is awake alert and oriented and able to answer questions appropriately.  No blood in stools.  Denies urinary symptoms.  No fever no chills.  ED Course: Temperature 98.2.  Heart rate 57-72.  Blood pressure systolic 140s to 324 systolic.  O2 sat greater than 91% on room air. WBC 6.6.  Potassium 3.  Head CT negative for acute abnormality.  CT abdomen and pelvis consistent with colitis/diverticulitis, no abscess or perforation. IV ceftriaxone and metronidazole started, 1 L bolus given.  Considering age and hypokalemia with ongoing GI losses, hospitalization was requested.  Review of Systems: As per HPI all other systems reviewed and negative.  Past Medical History:  Diagnosis Date   Acute blood loss anemia 08/08/2020   Acute renal failure (ARF) (HCC) 08/17/2017   Anxiety 04/05/2012   Bilateral sensorineural hearing loss 06/11/2014   Mild to moderate on the left side and slight to mild on the right side per audiometry 05/2014.  Hearing aides with possible masking of tinnitus recommended but patient wished to defer secondary to finances.   Blood transfusion without reported diagnosis    pt denies   Bursitis of right shoulder 07/12/2012   s/p shoulder injection  07/12/2012    Cataract of right eye    REMOVED RIGHT EYE 4-19    Constipation due to pain medication 04/27/2010   Diverticulosis 02/08/2012   Extensive left-sided diverticula on colonoscopy March 2012 per Dr. Jena Gauss    Essential hypertension 07/20/2006   Genital herpes 07/20/2006   Glaucoma of left eye 07/20/2006   Headache 10/20/2019   Heart murmur 1961   Human immunodeficiency virus disease (HCC) 03/27/1986   Hyperlipidemia LDL goal < 100 04/05/2012   Hypokalemia 04/12/2018   Insomnia 10/20/2019   Left hip pain 04/11/2021   Leg pain 04/11/2021   Long-term current use of opiate analgesic 03/17/2016   Loose stools 04/08/2019   Lumbar degenerative disc disease 07/20/2006   With chronic back pain    Marijuana use 07/03/2016   Micturition syncope 09/20/2015   Nausea and vomiting 04/08/2019   Opiate dependence (HCC) 04/12/2018   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   s/p aortobifem bypass 2009    Periumbilical hernia 05/18/2014   1 cm left periumbilical abdominal wall defect   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   DEXA 04/15/2012: L1-L4 spine T -3.9, Right femur T -3.0    Right rotator cuff tear 02/01/2013   Responds to periodic steroid injections   Seborrhea 09/01/2010   Shoulder pain, right 12/04/2017   Small bowel obstruction due to adhesions (HCC) 02/08/2012   s/p Exploratory laparotomy, lysis of adhesions 02/12/12     Subjective tinnitus of both ears 05/18/2014   Tobacco abuse 02/19/2012   Tobacco abuse    Vasovagal syncope 02/15/2015  Vitamin D deficiency 05/29/2018   Vitamin D 18.96 (04/30/2018), treated with ergocalciferol 50,000 units PO QWk X 4 weeks   Voiding dysfunction    s/p cystoscopy and meatal dilation Dec 2005    Past Surgical History:  Procedure Laterality Date   ABDOMINAL HYSTERECTOMY     AORTO-FEMORAL BYPASS GRAFT  04/2007   APPENDECTOMY     BREAST SURGERY     Breast biopsy: negative   CHOLECYSTECTOMY     COLECTOMY  01/2011   Dr. Jamey Ripa; "took out 12 inches of small  intestiines and removed blockage"   COLONOSCOPY  2012   EYE SURGERY     EYE SURGERY  06/29/2020   06-29-2020- RIGHT CATARACT REMOVED AND LEFT EYE SURGERY TO REMOVE SAND LIKE SUBSTANCE    FEMORAL ARTERY EXPLORATION Left 07/10/2020   Procedure: REDO LEFT FEMORAL ARTERY EXPOSURE;  Surgeon: Nada Libman, MD;  Location: [facility] OR;  Service: Vascular;  Laterality: Left;   LAPAROTOMY  02/12/2012   Procedure: EXPLORATORY LAPAROTOMY;  Surgeon: Almond Lint, MD;  Location: [facility] OR;  Service: General;  Laterality: N/A;  Exploratory Laparotomy, lysis of adhesions   SMALL INTESTINE SURGERY     THROMBECTOMY FEMORAL ARTERY Left 07/10/2020   Procedure: THROMBECTOMY AORTA-BIFEMORAL GRAFT, PROFUNDA, AND SUPERFICIAL FEMORAL ARTERY  LEFT LEG;  Surgeon: Nada Libman, MD;  Location: [facility] OR;  Service: Vascular;  Laterality: Left;     reports that she has been smoking cigarettes. She has a 15.00 pack-year smoking history. She has been exposed to tobacco smoke. She has never used smokeless tobacco. She reports that she does not drink alcohol and does not use drugs.  Allergies  Allergen Reactions   Hctz [Hydrochlorothiazide] Other (See Comments)    Dizziness, syncope; does NOT wish to take anymore    Family History  Problem Relation Age of Onset   Kidney failure Mother    Diabetes Mother    Hypertension Mother    Heart disease Mother    Glaucoma Father    Congestive Heart Failure Sister    Diabetes Sister    Kidney disease Sister    Diabetes Brother    Unexplained death Brother 4       Automobile accident   Hypothyroidism Daughter    Arthritis Daughter        Neck/Back   Healthy Son    HIV/AIDS Brother    HIV Daughter    Kidney disease Daughter    Arthritis Son        Knee   Colon cancer Neg Hx    Colon polyps Neg Hx    Esophageal cancer Neg Hx    Rectal cancer Neg Hx    Stomach cancer Neg Hx    Prior to Admission medications   Medication Sig Start Date End Date Taking? Authorizing Provider   acyclovir (ZOVIRAX) 400 MG tablet TAKE 1 TABLET BY MOUTH THREE TIMES DAILY AS NEEDED FOR OUTBREAKS FOR SEVEN DAYS Patient taking differently: Take 400 mg by mouth 3 (three) times daily as needed. For severe outbreaks for 7 days 04/12/22  Yes Inez Catalina, MD  alendronate (FOSAMAX) 70 MG tablet TAKE 1 TABLET BY MOUTH ONCE WEEKLY ON SUNDAY take with a full glass of water on an empty stomach Patient taking differently: Take 70 mg by mouth once a week. 03/16/22  Yes Inez Catalina, MD  ALPRAZolam Prudy Feeler) 0.5 MG tablet TAKE 2 TO 3 TABLETS BY MOUTH AT BEDTIME AS NEEDED FOR anxiety/sleep 05/23/22  Yes Inez Catalina, MD  ammonium lactate (AMLACTIN) 12 % cream Apply topically. 05/15/22  Yes [provider]  aspirin EC 81 MG tablet Take 1 tablet by mouth daily.   Yes [provider]  atorvastatin (LIPITOR) 10 MG tablet TAKE 1 TABLET BY MOUTH AT BEDTIME 10/04/21  Yes Inez Catalina, MD  benazepril (LOTENSIN) 40 MG tablet TAKE 1 TABLET BY MOUTH EVERY DAY 05/31/22  Yes Inez Catalina, MD  bictegravir-emtricitabine-tenofovir AF (BIKTARVY) 50-200-25 MG TABS tablet Take 1 tablet by mouth daily. 06/15/22  Yes Daiva Eves, Lisette Grinder, MD  clopidogrel (PLAVIX) 75 MG tablet TAKE 1 TABLET BY MOUTH DAILY 07/03/22  Yes Inez Catalina, MD  COMBIGAN 0.2-0.5 % ophthalmic solution Place 1 drop into the left eye 2 (two) times daily. 01/06/13  Yes [provider]  dextromethorphan-guaiFENesin (MUCINEX DM) 30-600 MG 12hr tablet Take 1-2 tablets by mouth 2 (two) times daily as needed for cough.   Yes [provider]  diclofenac Sodium (VOLTAREN) 1 % GEL APPLY TWO GRAMS TO AFFECTED AREA(S) FOUR TIMES DAILY Patient taking differently: Apply 2 g topically 4 (four) times daily. 06/06/21  Yes Inez Catalina, MD  dorzolamide-timolol (COSOPT) 22.3-6.8 MG/ML ophthalmic solution Place 1 drop into both eyes 2 (two) times daily. 03/09/21  Yes [provider]  gabapentin (NEURONTIN) 300 MG capsule  Take 2 capsules (600 mg total) by mouth 3 (three) times daily. 12/30/21 12/30/22 Yes Inez Catalina, MD  melatonin 5 MG TABS Take 5 mg by mouth at bedtime.   Yes [provider]  Multiple Vitamin (MULTIVITAMIN WITH MINERALS) TABS Take 1 tablet by mouth daily.   Yes [provider]  oxyCODONE-acetaminophen (PERCOCET/ROXICET) 5-325 MG tablet TAKE 1 TABLET BY MOUTH EVERY TWELVE HOURS AS NEEDED FOR PAIN 05/23/22  Yes Inez Catalina, MD  phenylephrine (NEO-SYNEPHRINE) 1 % nasal spray Place 1 drop into both nostrils every 6 (six) hours as needed for congestion.   Yes [provider]  psyllium (REGULOID) 0.52 g capsule Take 0.52 g by mouth daily.   Yes [provider]  terazosin (HYTRIN) 5 MG capsule Take 1 capsule (5 mg total) by mouth at bedtime. 07/21/21  Yes Demaio, Alexa, MD  triamcinolone ointment (KENALOG) 0.1 % APPLY TO THE AFFECTED AREA(S) TWICE DAILY Patient taking differently: Apply 1 application  topically 2 (two) times daily as needed (rash). 07/05/20  Yes Inez Catalina, MD  acetaminophen (TYLENOL) 500 MG tablet Take by mouth.    [provider]  amLODipine (NORVASC) 10 MG tablet TAKE 1 TABLET BY MOUTH DAILY 12/01/21   Inez Catalina, MD  brimonidine (ALPHAGAN) 0.2 % ophthalmic solution USE 1 DROP IN [facility] EYE TWICE DAILY 04/01/21   [provider]    Physical Exam: Vitals:   07/11/22 1800 07/11/22 1830 07/11/22 1850 07/11/22 1858  BP: (!) 193/79 (!) 202/70  (!) 200/70  Pulse: 68 68  72  Resp:    20  Temp:  98.3 F (36.8 C)  98.3 F (36.8 C)  TempSrc:    Oral  SpO2: 95% 98%  98%  Weight:   55.5 kg   Height:   5\' 6"  (1.676 m)     Constitutional: NAD, calm, comfortable Vitals:   07/11/22 1800 07/11/22 1830 07/11/22 1850 07/11/22 1858  BP: (!) 193/79 (!) 202/70  (!) 200/70  Pulse: 68 68  72  Resp:    20  Temp:  98.3 F (36.8 C)  98.3 F (36.8 C)  TempSrc:    Oral  SpO2: 95% 98%  98%  Weight:   55.5 kg   Height:   5\' 6"   (1.676 m)    Eyes: Blind left eye, with corneal scarring, minimal vision in right eye.  Lids normal.  Wearing dark shaded glasses in the room. ENMT: Mucous membranes are moist.   Neck: normal, supple, no masses, no thyromegaly Respiratory: clear to auscultation bilaterally, no wheezing, no crackles. Normal respiratory effort. No accessory muscle use.  Cardiovascular: Regular rate and rhythm, no murmurs / rubs / gallops. No extremity edema. 2+ pedal pulses. No carotid bruits.  Abdomen: no tenderness, no masses palpated. No hepatosplenomegaly.  Musculoskeletal: no clubbing / cyanosis. No joint deformity upper and lower extremities.  Skin: no rashes, lesions, ulcers. No induration Neurologic: No apparent cranial nerve abnormality, moving EXTR spontaneously. Psychiatric: Normal judgment and insight. Alert and oriented x 3. Normal mood.   Labs on Admission: I have personally reviewed following labs and imaging studies  CBC: Recent Labs  Lab 07/11/22 1439  WBC 6.6  HGB 13.3  HCT 39.4  MCV 96.1  PLT 302   Basic Metabolic Panel: Recent Labs  Lab 07/11/22 1439  NA 138  K 3.0*  CL 108  CO2 21*  GLUCOSE 115*  BUN 11  CREATININE 0.65  CALCIUM 8.9   GFR: Estimated Creatinine Clearance: 50 mL/min (by C-G formula based on SCr of 0.65 mg/dL). Liver Function Tests: Recent Labs  Lab 07/11/22 1439  AST 18  ALT 7  ALKPHOS 66  BILITOT 0.6  PROT 7.6  ALBUMIN 3.5   Recent Labs  Lab 07/11/22 1439  LIPASE 29   Urine analysis:    Component Value Date/Time   COLORURINE YELLOW 08/15/2019 1007   APPEARANCEUR HAZY (A) 08/15/2019 1007   APPEARANCEUR Clear 01/04/2018 1125   LABSPEC 1.015 08/15/2019 1007   PHURINE 5.0 08/15/2019 1007   GLUCOSEU NEGATIVE 08/15/2019 1007   GLUCOSEU NEG mg/dL 96/06/5407 8119   HGBUR SMALL (A) 08/15/2019 1007   HGBUR trace-intact 01/15/2008 1032   BILIRUBINUR NEGATIVE 08/15/2019 1007   BILIRUBINUR Negative 01/04/2018 1125   KETONESUR NEGATIVE  08/15/2019 1007   PROTEINUR NEGATIVE 08/15/2019 1007   UROBILINOGEN 1.0 09/11/2017 1529   UROBILINOGEN 0.2 09/03/2014 2205   NITRITE POSITIVE (A) 08/15/2019 1007   LEUKOCYTESUR MODERATE (A) 08/15/2019 1007    Radiological Exams on Admission: CT ABDOMEN PELVIS W CONTRAST  Result Date: 07/11/2022 CLINICAL DATA:  Left lower quadrant abdominal pain EXAM: CT ABDOMEN AND PELVIS WITH CONTRAST TECHNIQUE: Multidetector CT imaging of the abdomen and pelvis was performed using the standard protocol following bolus administration of intravenous contrast. RADIATION DOSE REDUCTION: This exam was performed according to the departmental dose-optimization program which includes automated exposure control, adjustment of the mA and/or kV according to patient size and/or use of iterative reconstruction technique. CONTRAST:  OMNIPAQUE IOHEXOL 300 MG/ML  SOLN COMPARISON:  CT abdomen and pelvis 09/06/2013 FINDINGS: Lower chest: Bibasilar atelectasis/scarring. Cardiomegaly. No acute abnormality. Hepatobiliary: Cholecystectomy. Prominent intra and extrahepatic bile ducts likely due to reservoir effect. Unremarkable liver. Pancreas: Unremarkable. Spleen: Unremarkable. Adrenals/Urinary Tract: Stable adrenal glands. Bilateral cortical renal scarring. Low-attenuation lesions in the kidneys are statistically likely to represent cysts. No follow-up is required. No obstructing urinary calculi or hydronephrosis. Unremarkable bladder for degree of distention. Stomach/Bowel: Extensive colonic diverticulosis. There is mild wall thickening and adjacent fluid and stranding about the sigmoid colon. Additional wall thickening and mucosal hyperenhancement of the right colon with stranding and fluid about the cecum. No evidence of obstruction. Stomach  is within normal limits. Patulous small bowel anastomosis in the left abdomen. Fecalization of the terminal ileum. Appendectomy. Small hiatal hernia. Vascular/Lymphatic: Postoperative change  of aortobifemoral bypass graft. There is occlusion of the left superficial femoral artery (series 2/image 83). This is age indeterminate but new since the most recent comparison in 53. 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 No lymphadenopathy. Reproductive: Hysterectomy. Other: Small volume free fluid in the pelvis and about the cecum. No free intraperitoneal air. Musculoskeletal: No acute osseous abnormality. IMPRESSION: 1. Wall thickening and mucosal hyperenhancement of the right colon and sigmoid colon with adjacent fluid and stranding. Findings are favored to represent infectious or inflammatory colitis/diverticulitis. No abscess or perforation. 2. Postoperative change of aortobifemoral bypass graft. 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 This is age indeterminate and may be chronic but new since the most recent comparison in 2015. 3. 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 4. Fecalization of the terminal ileum compatible with slow transit/constipation. Critical Value/emergent results were called by telephone at the time of interpretation on 07/11/2022 at 4:25 pm to provider Eber Hong, MD, who verbally acknowledged these results. Electronically Signed   By: Minerva Fester M.D.   On: 07/11/2022 16:26   CT Head Wo Contrast  Result Date: 07/11/2022 CLINICAL DATA:  Trauma EXAM: CT HEAD WITHOUT CONTRAST TECHNIQUE: Contiguous axial images were obtained from the base of the skull through the vertex without intravenous contrast. RADIATION DOSE REDUCTION: This exam was performed according to the departmental dose-optimization program which includes automated exposure control, adjustment of the mA and/or kV according to patient size and/or use of iterative reconstruction technique. COMPARISON:  CT Head 04/08/18 FINDINGS: Brain: No evidence of acute infarction, hemorrhage, hydrocephalus,  extra-axial collection or mass lesion/mass effect. 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 Vascular: No hyperdense vessel or unexpected calcification. Skull: Normal. Negative for fracture or focal lesion. Sinuses/Orbits: No middle ear or mastoid effusion. Paranasal sinuses are clear. Right lens replacement and glaucoma drainage catheter. Orbits are otherwise unremarkable. Other: None. IMPRESSION: No CT evidence of intracranial injury Electronically Signed   By: Lorenza Cambridge M.D.   On: 07/11/2022 16:09    EKG: None    Assessment/Plan Principal Problem:   Acute diverticulitis Active Problems:   Human immunodeficiency virus disease (HCC)   Essential hypertension   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HCC)   Hypokalemia  Assessment and Plan: * Acute diverticulitis CT abd/Pelvis + contrast-  acute diverticulitis/colitis.  Will start for sepsis.  Afebrile.  No leukocytosis.  Presenting with diarrhea, vomiting. -Stool C. Difficile - Continue IV ceftriaxone and metronidazole -1 Liter bolus given, continue N/s + 40 Kcl 75cc/hr x 20hrs -IV morphine 2 mg every 4 hours as needed for pain -Zofran as needed -Check QTc on EKG -Bowel rest with clear liquid diet  Essential hypertension Blood pressure systolic 140s to 960A. -Resume home Norvasc, benazepril, terazosin -IV hydralazine 10 mg every 4 hourly as needed for systolic > 170  Human immunodeficiency virus disease (HCC) Controlled.  Follows with Dr. Algis Liming.  On Biktarvy.  Recent HIV viral load 06/15/22- not detected.  CD4 count 1139. - Resume Birktarvy   DVT prophylaxis: Lovenox Code Status: Full code, confirmed with patient and daughter, son-in-law at bedside. Family Communication: Daughter [name]- at bedside, and son-in-law Chrissie Noa at bedside Disposition Plan: ~ 2 days Consults called: None Admission status: Inpt Tele I certify that at the point of admission it is my  clinical judgment that the patient will require inpatient  hospital care spanning beyond 2 midnights from the point of admission due to high intensity of service, high risk for further deterioration and high frequency of surveillance required.   Author: Onnie Boer, MD 07/11/2022 7:41 PM  For on call review [email or web address].

## 2022-07-11 NOTE — Assessment & Plan Note (Signed)
Resume aspirin and Plavix.

## 2022-07-11 NOTE — Plan of Care (Signed)

## 2022-07-11 NOTE — Assessment & Plan Note (Addendum)
Controlled.  Follows with Dr. Algis Liming.  On Biktarvy.  Recent HIV viral load 06/15/22- not detected.  CD4 count 1139. - Resume Birktarvy

## 2022-07-11 NOTE — ED Triage Notes (Signed)
Pt from home. A/o. Pt baseline. Pt lives with others and told ems pt was altered yesterday. Pt is a/o today. Pt c/o nasal congestion and "allergies" symptoms x 4 days. Pt had two falls yesterday. Cbg 140. Pt c/o intermittent LLQ pain that is chronic x 5 days. Nausea started this am with vomiting x 1. Diarrhea x 3 today and 3 yesterday. C/o lower back pain x 4 days. C/o headache. Pt color wnl. Pt normally walks in home without help but uses cane when she goes out. Pt lost balance in bathroom twice and fell yesterday.

## 2022-07-11 NOTE — Assessment & Plan Note (Signed)
Potassium 3.   - Check magnesium.   - Replete K

## 2022-07-11 NOTE — Assessment & Plan Note (Addendum)
Blood pressure systolic 140s to 960A. -Resume benazepril, terazosin.  -IV hydralazine 10 mg every 4 hourly as needed for systolic > 170

## 2022-07-11 NOTE — Assessment & Plan Note (Signed)
CT abd/Pelvis + contrast-  acute diverticulitis/colitis.  Will start for sepsis.  Afebrile.  No leukocytosis.  Presenting with diarrhea, vomiting. -Stool C. Difficile - Continue IV ceftriaxone and metronidazole -1 Liter bolus given, continue N/s + 40 Kcl 75cc/hr x 20hrs -IV morphine 2 mg every 4 hours as needed for pain -Zofran as needed -Check QTc on EKG -Bowel rest with clear liquid diet

## 2022-07-12 DIAGNOSIS — K5792 Diverticulitis of intestine, part unspecified, without perforation or abscess without bleeding: Secondary | ICD-10-CM | POA: Diagnosis not present

## 2022-07-12 LAB — BASIC METABOLIC PANEL
Anion gap: 8 (ref 5–15)
BUN: 6 mg/dL — ABNORMAL LOW (ref 8–23)
CO2: 21 mmol/L — ABNORMAL LOW (ref 22–32)
Calcium: 8.4 mg/dL — ABNORMAL LOW (ref 8.9–10.3)
Chloride: 112 mmol/L — ABNORMAL HIGH (ref 98–111)
Creatinine, Ser: 0.59 mg/dL (ref 0.44–1.00)
GFR, Estimated: >60 mL/min (ref >60)
Glucose, Bld: 79 mg/dL (ref 70–99)
Potassium: 2.7 mmol/L — CL (ref 3.5–5.1)
Sodium: 141 mmol/L (ref 135–145)

## 2022-07-12 LAB — CBC
HCT: 36.7 % (ref 36.0–46.0)
Hemoglobin: 12.4 g/dL (ref 12.0–15.0)
MCH: 32.1 pg (ref 26.0–34.0)
MCHC: 33.8 g/dL (ref 30.0–36.0)
MCV: 95.1 fL (ref 80.0–100.0)
Platelets: 330 10*3/uL (ref 150–400)
RBC: 3.86 MIL/uL — ABNORMAL LOW (ref 3.87–5.11)
RDW: 13.1 % (ref 11.5–15.5)
WBC: 6.3 10*3/uL (ref 4.0–10.5)
nRBC: 0 % (ref 0.0–0.2)

## 2022-07-12 MED ORDER — POTASSIUM CHLORIDE CRYS ER 20 MEQ PO TBCR
40.0000 meq | EXTENDED_RELEASE_TABLET | ORAL | Status: AC
  Administered 2022-07-12 (×2): 40 meq via ORAL
  Filled 2022-07-12 (×2): qty 2

## 2022-07-12 MED ORDER — POTASSIUM CHLORIDE IN NACL 40-0.9 MEQ/L-% IV SOLN: INTRAVENOUS | Status: AC

## 2022-07-12 MED ORDER — POTASSIUM CHLORIDE 10 MEQ/100ML IV SOLN
10.0000 meq | INTRAVENOUS | Status: DC
  Filled 2022-07-12 (×4): qty 100

## 2022-07-12 MED ORDER — BISACODYL 10 MG RE SUPP
10.0000 mg | Freq: Every day | RECTAL | Status: DC
  Administered 2022-07-12: 10 mg via RECTAL
  Filled 2022-07-12: qty 1

## 2022-07-12 MED ORDER — POLYETHYLENE GLYCOL 3350 17 G PO PACK
17.0000 g | PACK | Freq: Two times a day (BID) | ORAL | Status: DC
  Administered 2022-07-12: 17 g via ORAL
  Filled 2022-07-12 (×2): qty 1

## 2022-07-12 MED ORDER — MELATONIN 3 MG PO TABS
6.0000 mg | ORAL_TABLET | Freq: Every day | ORAL | Status: DC
  Administered 2022-07-12: 6 mg via ORAL

## 2022-07-12 NOTE — Progress Notes (Signed)
Mobility Specialist Progress Note:   07/12/22 1128  Mobility  Activity Ambulated with assistance to bathroom  Level of Assistance Contact guard assist, steadying assist  Assistive Device None  Distance Ambulated (ft) 20 ft  Activity Response Tolerated well  Mobility Referral Yes  $Mobility charge 1 Mobility   Pt bed alarm activated, NT in room assisting pt to bathroom. Returned pt back to bed. 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 Pt was able to navigate easier without RW, HHA and MinG required for safety. Left pt in bed, alarm on, phone and call bell in reach. All needs met.   Feliciana Rossetti Mobility Specialist Please contact via Special educational needs teacher or  Rehab office at [phone]

## 2022-07-12 NOTE — Progress Notes (Signed)
PROGRESS NOTE   Jessica [name], is a 82 y.o. female, DOB - 1940-06-29, BJY:[id number]  Admit date - 07/11/2022   Admitting Physician Jessica Boer, MD  Outpatient Primary MD for the patient is Jessica Catalina, MD  LOS - 1  Chief Complaint  Patient presents with   Nasal Congestion   Abdominal Pain   Back Pain   Nausea       Brief Narrative:  Jessica [name] is a 82 y.o. female with medical history significant for peripheral vascular disease, hypertension, HIV, anxiety admitted on 07/11/2022 with acute diverticulitis and constipation after presenting with nausea vomiting diarrhea and abdominal discomfort    -Assessment and Plan: 1)Acute diverticulitis of the right colon and sigmoid -No fevers, no further emesis, some nausea persist, some abdominal pain persist  -pain Rx and antiemetics as ordered -Advance diet to full liquids -Continue IV fluids pending better tolerance of oral intake =-Continue IV Rocephin/Flagyl----patient is immunocompromise due to #1 below  2)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  Fecalization of the terminal ileum compatible with slow transit/constipation. ???  If patient was having overflow diarrhea  -Send stool sample for GI panel -Give Dulcolax suppository and MiraLAX  4)Hypokalemia -Due to GI losses -Replace and recheck  5)Peripheral vascular occlusive disease--- CT abdomen and pelvis shows predominantly calcified plaque in the mid SMA causes moderate and possibly severe narrowing. C/n aspirin and Plavix.  6)HTN--- BP stable, continue Hytrin and Lotensin  may use IV Hydralazine 10 mg  Every 4 hours Prn for systolic blood pressure over 170 mmhg   7)Human immunodeficiency virus disease (HCC) Controlled.  Follows with Dr. Algis [name].  On Biktarvy.  Recent HIV viral load 06/15/22- not detected.  CD4 count 1139. -c/n   Birktarvy  Status is: Inpatient   Disposition: The patient is from: Home              Anticipated d/c is to: Home              Anticipated d/c date is: 2 days              Patient currently is not medically stable to d/c. Barriers: Not Clinically Stable-   Code Status :  -  Code Status: Full Code   Family Communication:    NA (patient is alert, awake and coherent)   DVT Prophylaxis  :   - SCDs/ enoxaparin (LOVENOX) injection 40 mg Start: 07/11/22 2030  Lab Results  Component Value Date   PLT 330 07/12/2022   Inpatient Medications  Scheduled Meds:  aspirin EC  81 mg Oral Daily   benazepril  40 mg Oral Daily   bictegravir-emtricitabine-tenofovir AF  1 tablet Oral Daily   clopidogrel  75 mg Oral Daily   dorzolamide-timolol  1 drop Both Eyes BID   enoxaparin (LOVENOX) injection  40 mg Subcutaneous Q24H   gabapentin  600 mg Oral TID   melatonin  5 mg Oral QHS   terazosin  5 mg Oral QHS   Continuous Infusions:  0.9 % NaCl with KCl 40 mEq / L 75 mL/hr at 07/12/22 1042   cefTRIAXone (ROCEPHIN)  IV 2 g (07/12/22 1550)   metronidazole Stopped (07/12/22 0618)   PRN Meds:.acetaminophen **OR** acetaminophen, ALPRAZolam, dextromethorphan-guaiFENesin, hydrALAZINE, morphine injection, ondansetron **OR** ondansetron (ZOFRAN) IV   Anti-infectives (From admission, onward)    Start     Dose/Rate Route Frequency Ordered Stop  07/12/22 1600  cefTRIAXone (ROCEPHIN) 2 g in sodium chloride 0.9 % 100 mL IVPB        2 g 200 mL/hr over 30 Minutes Intravenous Every 24 hours 07/11/22 1938     07/12/22 1000  bictegravir-emtricitabine-tenofovir AF (BIKTARVY) 50-200-25 MG per tablet 1 tablet        1 tablet Oral Daily 07/11/22 1938     07/12/22 0600  metroNIDAZOLE (FLAGYL) IVPB 500 mg        500 mg 100 mL/hr over 60 Minutes Intravenous Every 12 hours 07/11/22 1938     07/11/22 1645  cefTRIAXone (ROCEPHIN) 2 g in sodium chloride 0.9 % 100 mL IVPB       See Hyperspace for full Linked Orders  Report.   2 g 200 mL/hr over 30 Minutes Intravenous  Once 07/11/22 1640 07/11/22 1819   07/11/22 1645  metroNIDAZOLE (FLAGYL) IVPB 500 mg       See Hyperspace for full Linked Orders Report.   500 mg 100 mL/hr over 60 Minutes Intravenous  Once 07/11/22 1640 07/11/22 1904       Subjective: Jessica [name] today has no fevers,   No chest pain,   - Nausea but no emesis -Tolerating oral liquids well  Objective: Vitals:   07/12/22 0400 07/12/22 0504 07/12/22 0600 07/12/22 1545  BP:  (!) 176/66  138/60  Pulse:  66  64  Resp: 16 18 15 20   Temp:  98.4 F (36.9 C)  98.2 F (36.8 C)  TempSrc:  Oral  Oral  SpO2:  96%  97%  Weight:      Height:        Intake/Output Summary (Last 24 hours) at 07/12/2022 1640 Last data filed at 07/12/2022 1600 Gross per 24 hour  Intake 2069.41 ml  Output --  Net 2069.41 ml   Filed Weights   07/11/22 1850  Weight: 55.5 kg   Physical Exam Gen:- Awake Alert,  in no apparent distress HEENT:- [place].AT, No sclera icterus Eyes-left eye complete vision loss, right eye partial vision loss Neck-Supple Neck,No JVD,.  Lungs-  CTAB , fair symmetrical air movement CV- S1, S2 normal, regular  Abd-  +ve B.Sounds, Abd Soft, +VE  Lower abd tenderness, no rebound or guarding, no CVA area tenderness Extremity/Skin:- No  edema, pedal pulses present  Psych-affect is appropriate, oriented x3 Neuro-no new focal deficits, no tremors  Data Reviewed: I have personally reviewed following labs and imaging studies  CBC: Recent Labs  Lab 07/11/22 1439 07/12/22 0447  WBC 6.6 6.3  HGB 13.3 12.4  HCT 39.4 36.7  MCV 96.1 95.1  PLT 302 330   Basic Metabolic Panel: Recent Labs  Lab 07/11/22 1439 07/12/22 0447  NA 138 141  K 3.0* 2.7*  CL 108 112*  CO2 21* 21*  GLUCOSE 115* 79  BUN 11 6*  CREATININE 0.65 0.59  CALCIUM 8.9 8.4*  MG 2.1  --    GFR: Estimated Creatinine Clearance: 50 mL/min (by C-G formula based on SCr of 0.59 mg/dL). Liver Function  Tests: Recent Labs  Lab 07/11/22 1439  AST 18  ALT 7  ALKPHOS 66  BILITOT 0.6  PROT 7.6  ALBUMIN 3.5   Radiology Studies: CT ABDOMEN PELVIS W CONTRAST  Result Date: 07/11/2022 CLINICAL DATA:  Left lower quadrant abdominal pain EXAM: CT ABDOMEN AND PELVIS WITH CONTRAST TECHNIQUE: Multidetector CT imaging of the abdomen and pelvis was performed using the standard protocol following bolus administration of intravenous contrast. RADIATION DOSE REDUCTION: This  exam was performed according to the departmental dose-optimization program which includes automated exposure control, adjustment of the mA and/or kV according to patient size and/or use of iterative reconstruction technique. CONTRAST:  OMNIPAQUE IOHEXOL 300 MG/ML  SOLN COMPARISON:  CT abdomen and pelvis 09/06/2013 FINDINGS: Lower chest: Bibasilar atelectasis/scarring. Cardiomegaly. No acute abnormality. Hepatobiliary: Cholecystectomy. Prominent intra and extrahepatic bile ducts likely due to reservoir effect. Unremarkable liver. Pancreas: Unremarkable. Spleen: Unremarkable. Adrenals/Urinary Tract: Stable adrenal glands. Bilateral cortical renal scarring. Low-attenuation lesions in the kidneys are statistically likely to represent cysts. No follow-up is required. No obstructing urinary calculi or hydronephrosis. Unremarkable bladder for degree of distention. Stomach/Bowel: Extensive colonic diverticulosis. There is mild wall thickening and adjacent fluid and stranding about the sigmoid colon. Additional wall thickening and mucosal hyperenhancement of the right colon with stranding and fluid about the cecum. No evidence of obstruction. Stomach is within normal limits. Patulous small bowel anastomosis in the left abdomen. Fecalization of the terminal ileum. Appendectomy. Small hiatal hernia. 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 This is  age indeterminate but new since the most recent comparison in 57. 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 No lymphadenopathy. Reproductive: Hysterectomy. Other: Small volume free fluid in the pelvis and about the cecum. No free intraperitoneal air. Musculoskeletal: No acute osseous abnormality. IMPRESSION: 1. Wall thickening and mucosal hyperenhancement of the right colon and sigmoid colon with adjacent fluid and stranding. Findings are favored to represent infectious or inflammatory colitis/diverticulitis. No abscess or perforation. 2. Postoperative change of aortobifemoral bypass graft. 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 This is age indeterminate and may be chronic but new since the most recent comparison in 2015. 3. 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 4. Fecalization of the terminal ileum compatible with slow transit/constipation. Critical Value/emergent results were called by telephone at the time of interpretation on 07/11/2022 at 4:25 pm to provider Eber Hong, MD, who verbally acknowledged these results. Electronically Signed   By: Minerva Fester M.D.   On: 07/11/2022 16:26   CT Head Wo Contrast  Result Date: 07/11/2022 CLINICAL DATA:  Trauma EXAM: CT HEAD WITHOUT CONTRAST TECHNIQUE: Contiguous axial images were obtained from the base of the skull through the vertex without intravenous contrast. RADIATION DOSE REDUCTION: This exam was performed according to the departmental dose-optimization program which includes automated exposure control, adjustment of the mA and/or kV according to patient size and/or use of iterative reconstruction technique. COMPARISON:  CT Head 04/08/18 FINDINGS: Brain: No evidence of acute infarction, hemorrhage, hydrocephalus, extra-axial collection or mass lesion/mass effect. 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 Vascular: No  hyperdense vessel or unexpected calcification. Skull: Normal. Negative for fracture or focal lesion. Sinuses/Orbits: No middle ear or mastoid effusion. Paranasal sinuses are clear. Right lens replacement and glaucoma drainage catheter. Orbits are otherwise unremarkable. Other: None. IMPRESSION: No CT evidence of intracranial injury Electronically Signed   By: Lorenza Cambridge M.D.   On: 07/11/2022 16:09    Scheduled Meds:  aspirin EC  81 mg Oral Daily   benazepril  40 mg Oral Daily   bictegravir-emtricitabine-tenofovir AF  1 tablet Oral Daily   clopidogrel  75 mg Oral Daily   dorzolamide-timolol  1 drop Both Eyes BID   enoxaparin (LOVENOX) injection  40 mg Subcutaneous Q24H   gabapentin  600 mg Oral TID   melatonin  5  mg Oral QHS   terazosin  5 mg Oral QHS   Continuous Infusions:  0.9 % NaCl with KCl 40 mEq / L 75 mL/hr at 07/12/22 1042   cefTRIAXone (ROCEPHIN)  IV 2 g (07/12/22 1550)   metronidazole Stopped (07/12/22 0618)     LOS: 1 day   Shon Hale M.D on 07/12/2022 at 4:40 PM  Go to [email or web address] - for contact info  [facility] - Office  [phone]  If 7PM-7AM, please contact night-coverage [email or web address] 07/12/2022, 4:40 PM

## 2022-07-12 NOTE — TOC Progression Note (Signed)
  Transition of Care [facility]) Screening Note   Patient Details  Name: Jessica [name] Date of Birth: Aug 25, 1940   [facility]) CM/SW Contact:    Elliot Gault, LCSW Phone Number: 07/12/2022, 9:25 AM    Transition of Care Department [facility]) has reviewed patient and no TOC needs have been identified at this time. We will continue to monitor patient advancement through interdisciplinary progression rounds. If new patient transition needs arise, please place a TOC consult.

## 2022-07-13 ENCOUNTER — Other Ambulatory Visit: Payer: Self-pay | Admitting: Internal Medicine

## 2022-07-13 DIAGNOSIS — K5792 Diverticulitis of intestine, part unspecified, without perforation or abscess without bleeding: Secondary | ICD-10-CM | POA: Diagnosis not present

## 2022-07-13 LAB — RENAL FUNCTION PANEL
Albumin: 3.1 g/dL — ABNORMAL LOW (ref 3.5–5.0)
Anion gap: 7 (ref 5–15)
BUN: 5 mg/dL — ABNORMAL LOW (ref 8–23)
CO2: 19 mmol/L — ABNORMAL LOW (ref 22–32)
Calcium: 8.5 mg/dL — ABNORMAL LOW (ref 8.9–10.3)
Chloride: 113 mmol/L — ABNORMAL HIGH (ref 98–111)
Creatinine, Ser: 0.59 mg/dL (ref 0.44–1.00)
GFR, Estimated: >60 mL/min (ref >60)
Glucose, Bld: 78 mg/dL (ref 70–99)
Phosphorus: 2 mg/dL — ABNORMAL LOW (ref 2.5–4.6)
Potassium: 4.2 mmol/L (ref 3.5–5.1)
Sodium: 139 mmol/L (ref 135–145)

## 2022-07-13 LAB — GASTROINTESTINAL PANEL BY PCR, STOOL (REPLACES STOOL CULTURE)

## 2022-07-13 LAB — C DIFFICILE QUICK SCREEN W PCR REFLEX
C Diff antigen: NEGATIVE
C Diff interpretation: NOT DETECTED
C Diff toxin: NEGATIVE

## 2022-07-13 MED ORDER — ASPIRIN 81 MG PO TBEC: 81.0000 mg | DELAYED_RELEASE_TABLET | Freq: Every day | ORAL | 5 refills | Status: DC

## 2022-07-13 MED ORDER — ALENDRONATE SODIUM 70 MG PO TABS
70.0000 mg | ORAL_TABLET | ORAL | 3 refills | Status: DC
Start: 2022-07-16 — End: 2023-06-14

## 2022-07-13 MED ORDER — ACETAMINOPHEN 325 MG PO TABS: 650.0000 mg | ORAL_TABLET | Freq: Four times a day (QID) | ORAL | 2 refills | Status: DC | PRN

## 2022-07-13 MED ORDER — POLYETHYLENE GLYCOL 3350 17 G PO PACK: 17.0000 g | PACK | ORAL | 2 refills | Status: DC

## 2022-07-13 MED ORDER — ATORVASTATIN CALCIUM 10 MG PO TABS
10.0000 mg | ORAL_TABLET | Freq: Every day | ORAL | 3 refills | Status: DC
Start: 2022-07-13 — End: 2023-06-14

## 2022-07-13 MED ORDER — AMLODIPINE BESYLATE 10 MG PO TABS
10.0000 mg | ORAL_TABLET | Freq: Every day | ORAL | 3 refills | Status: DC
Start: 2022-07-13 — End: 2023-04-05

## 2022-07-13 MED ORDER — CLOPIDOGREL BISULFATE 75 MG PO TABS: 75.0000 mg | ORAL_TABLET | Freq: Every day | ORAL | 3 refills | Status: DC

## 2022-07-13 MED ORDER — BENAZEPRIL HCL 40 MG PO TABS
40.0000 mg | ORAL_TABLET | Freq: Every day | ORAL | 3 refills | Status: DC
Start: 2022-07-13 — End: 2023-06-14

## 2022-07-13 MED ORDER — GABAPENTIN 300 MG PO CAPS
600.0000 mg | ORAL_CAPSULE | Freq: Three times a day (TID) | ORAL | 3 refills | Status: DC
Start: 2022-07-13 — End: 2022-10-06

## 2022-07-13 MED ORDER — METRONIDAZOLE 500 MG PO TABS: 500.0000 mg | ORAL_TABLET | Freq: Three times a day (TID) | ORAL | 0 refills | Status: AC

## 2022-07-13 MED ORDER — CEFDINIR 300 MG PO CAPS: 300.0000 mg | ORAL_CAPSULE | Freq: Two times a day (BID) | ORAL | 0 refills | Status: AC

## 2022-07-13 NOTE — Telephone Encounter (Signed)
Being discharged from hospital today

## 2022-07-13 NOTE — Care Management Important Message (Signed)
Important Message  Patient Details  Name: Jessica [name] MRN: [id number] Date of Birth: 1940-05-31   Medicare Important Message Given:  Yes (spoke with daughter Leanor Rubenstein at [phone] to review letter)     Corey Harold 07/13/2022, 11:21 AM

## 2022-07-13 NOTE — Discharge Instructions (Signed)
1)Please take Omnicef/Cefdinir and Flagyl/Metronidazole antibiotic as prescribed 2)Follow up with Dr. Criselda Peaches, Dillard Cannon, MD (Primary Care Physician) within 1 week for recheck and re-eval 3)Please Drink plenty of fluids and avoid dehydration 4)Take Miralax/Polyethylene Glycol 1 dose twice daily for 1 week and then 1 dose daily after that indefinitely for your bowel in order to avoid constipation

## 2022-07-13 NOTE — Discharge Summary (Signed)
Jessica [name], is a 82 y.o. female  DOB [birth year]  MRN [id number].  Admission date:  07/11/2022  Admitting Physician  Onnie Boer, MD  Discharge Date:  07/13/2022   Primary MD  Inez Catalina, MD  Recommendations for primary care physician for things to follow:  1)Please take Omnicef/Cefdinir and Flagyl/Metronidazole antibiotic as prescribed 2)Follow up with Dr. Criselda Peaches, Dillard Cannon, MD (Primary Care Physician) within 1 week for recheck and re-eval 3)Please Drink plenty of fluids and avoid dehydration 4)Take Miralax/Polyethylene Glycol 1 dose twice daily for 1 week and then 1 dose daily after that indefinitely for your bowel in order to avoid constipation  Admission Diagnosis  Diverticulitis [K57.92] Increased weakness when ambulating [R53.1] Acute diverticulitis [K57.92] Diarrhea, unspecified type [R19.7]   Discharge Diagnosis  Diverticulitis [K57.92] Increased weakness when ambulating [R53.1] Acute diverticulitis [K57.92] Diarrhea, unspecified type [R19.7]    Principal Problem:   Acute diverticulitis Active Problems:   Human immunodeficiency virus disease (HCC)   Essential hypertension   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HCC)   Hypokalemia      Past Medical History:  Diagnosis Date   Acute blood loss anemia 08/08/2020   Acute renal failure (ARF) (HCC) 08/17/2017   Anxiety 04/05/2012   Bilateral sensorineural hearing loss 06/11/2014   Mild to moderate on the left side and slight to mild on the right side per audiometry 05/2014.  Hearing aides with possible masking of tinnitus recommended but patient wished to defer secondary to finances.   Blood transfusion without reported diagnosis    pt denies   Bursitis of right shoulder 07/12/2012   s/p shoulder injection 07/12/2012    Cataract of right eye    REMOVED RIGHT EYE 4-19    Constipation due to pain medication 04/27/2010    Diverticulosis 02/08/2012   Extensive left-sided diverticula on colonoscopy March 2012 per Dr. Jena Gauss    Essential hypertension 07/20/2006   Genital herpes 07/20/2006   Glaucoma of left eye 07/20/2006   Headache 10/20/2019   Heart murmur 1961   Human immunodeficiency virus disease (HCC) 03/27/1986   Hyperlipidemia LDL goal < 100 04/05/2012   Hypokalemia 04/12/2018   Insomnia 10/20/2019   Left hip pain 04/11/2021   Leg pain 04/11/2021   Long-term current use of opiate analgesic 03/17/2016   Loose stools 04/08/2019   Lumbar degenerative disc disease 07/20/2006   With chronic back pain    Marijuana use 07/03/2016   Micturition syncope 09/20/2015   Nausea and vomiting 04/08/2019   Opiate dependence (HCC) 04/12/2018   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   s/p aortobifem bypass 2009    Periumbilical hernia 05/18/2014   1 cm left periumbilical abdominal wall defect   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   DEXA 04/15/2012: L1-L4 spine T -3.9, Right femur T -3.0    Right rotator cuff tear 02/01/2013   Responds to periodic steroid injections   Seborrhea 09/01/2010   Shoulder pain, right 12/04/2017   Small bowel obstruction due to adhesions (HCC) 02/08/2012   s/p Exploratory  laparotomy, lysis of adhesions 02/12/12     Subjective tinnitus of both ears 05/18/2014   Tobacco abuse 02/19/2012   Tobacco abuse    Vasovagal syncope 02/15/2015   Vitamin D deficiency 05/29/2018   Vitamin D 18.96 (04/30/2018), treated with ergocalciferol 50,000 units PO QWk X 4 weeks   Voiding dysfunction    s/p cystoscopy and meatal dilation Dec 2005    Past Surgical History:  Procedure Laterality Date   ABDOMINAL HYSTERECTOMY     AORTO-FEMORAL BYPASS GRAFT  04/2007   APPENDECTOMY     BREAST SURGERY     Breast biopsy: negative   CHOLECYSTECTOMY     COLECTOMY  01/2011   Dr. Jamey Ripa; "took out 12 inches of small intestiines and removed blockage"   COLONOSCOPY  2012   EYE SURGERY     EYE SURGERY  06/29/2020   06-29-2020-  RIGHT CATARACT REMOVED AND LEFT EYE SURGERY TO REMOVE SAND LIKE SUBSTANCE    FEMORAL ARTERY EXPLORATION Left 07/10/2020   Procedure: REDO LEFT FEMORAL ARTERY EXPOSURE;  Surgeon: Nada Libman, MD;  Location: [facility] OR;  Service: Vascular;  Laterality: Left;   LAPAROTOMY  02/12/2012   Procedure: EXPLORATORY LAPAROTOMY;  Surgeon: Almond Lint, MD;  Location: [facility] OR;  Service: General;  Laterality: N/A;  Exploratory Laparotomy, lysis of adhesions   SMALL INTESTINE SURGERY     THROMBECTOMY FEMORAL ARTERY Left 07/10/2020   Procedure: THROMBECTOMY AORTA-BIFEMORAL GRAFT, PROFUNDA, AND SUPERFICIAL FEMORAL ARTERY  LEFT LEG;  Surgeon: Nada Libman, MD;  Location: [facility] OR;  Service: Vascular;  Laterality: Left;       HPI  from the history and physical done on the day of admission:    Chief Complaint: Diarrhea, abdominal Pain   HPI: Jessica [name] is a 82 y.o. female with medical history significant for peripheral vascular disease, hypertension, HIV, anxiety. Patient was brought to the ED with reports of nausea, vomiting that started today, diarrhea and back pain started about 3 days ago.  Also reports onset of left lower abdominal pain started about 5 days ago.  Patient has fallen 4 times since yesterday.  Daughter at bedside report that patient was confused yesterday.  Today she is awake alert and oriented and able to answer questions appropriately.  No blood in stools.  Denies urinary symptoms.  No fever no chills.   ED Course: Temperature 98.2.  Heart rate 57-72.  Blood pressure systolic 140s to 295 systolic.  O2 sat greater than 91% on room air. WBC 6.6.  Potassium 3.  Head CT negative for acute abnormality.  CT abdomen and pelvis consistent with colitis/diverticulitis, no abscess or perforation. IV ceftriaxone and metronidazole started, 1 L bolus given.  Considering age and hypokalemia with ongoing GI losses, hospitalization was requested.     Hospital Course:     Brief Narrative:  Torrance Decordova is a 82 y.o. female with medical history significant for peripheral vascular disease, hypertension, HIV, anxiety admitted on 07/11/2022 with acute diverticulitis and constipation after presenting with nausea vomiting diarrhea and abdominal discomfort     -Assessment and Plan: 1)Acute diverticulitis of the right colon and sigmoid -No fevers, no further emesis, -Nausea and abdominal pain has mostly resolved -Diet has been advanced -Treated with IV Rocephin/Flagyl----patient is immunocompromise due to #2 below -Discharge on Omnicef and Flagyl   2)Human immunodeficiency virus disease (HCC) Controlled.  Follows with Dr. Algis Liming.  On Biktarvy.  Recent HIV viral load 06/15/22- not detected.  CD4 count 1139. -c/n  Birktarvy  3) chronic constipation--- CT abdomen and pelvis shows..  Fecalization of the terminal ileum compatible with slow transit/constipation. ???  If patient was having overflow diarrhea -Stool for C. difficile and GI pathogen is negative --Constipation resolved with Dulcolax suppository and MiraLAX -Take laxatives as prescribed = Drink plenty fluids avoid dehydration and avoid constipation   4)Hypokalemia -Due to GI losses -Replaced   5)Peripheral vascular occlusive disease--- CT abdomen and pelvis shows predominantly calcified plaque in the mid SMA causes moderate and possibly severe narrowing. C/n aspirin and Plavix.  And Lipitor  PAD----imaging studies suggest predominantly calcified plaque in the mid SMA causes moderate and possibly severe narrowing -Take aspirin, Plavix and Lipitor as prescribed   6)HTN--- BP stable, continue Hytrin and Lotensin    Disposition: The patient is from: Home              Anticipated d/c is to: Home   Discharge Condition: stable  Follow UP   Follow-up Information     Inez Catalina, MD. Schedule an appointment as soon as possible for a visit in 1 week(s).   Specialty: Internal Medicine Contact information: [address] [place] [address] [phone]                 Diet and Activity recommendation:  As advised  Discharge Instructions    Discharge Instructions     Call MD for:  difficulty breathing, headache or visual disturbances   Complete by: As directed    Call MD for:  persistant dizziness or light-headedness   Complete by: As directed    Call MD for:  persistant nausea and vomiting   Complete by: As directed    Call MD for:  temperature >100.4   Complete by: As directed    Diet - low sodium heart healthy   Complete by: As directed    Discharge instructions   Complete by: As directed    1)Please take Omnicef/Cefdinir and Flagyl/Metronidazole antibiotic as prescribed 2)Follow up with Dr. Inez Catalina, MD (Primary Care Physician) within 1 week for recheck and re-eval 3)Please Drink plenty of fluids and avoid dehydration 4)Take Miralax/Polyethylene Glycol 1 dose twice daily for 1 week and then 1 dose daily after that indefinitely for your bowel in order to avoid constipation   Increase activity slowly   Complete by: As directed         Discharge Medications     Allergies as of 07/13/2022       Reactions   Hctz [hydrochlorothiazide] Other (See Comments)   Dizziness, syncope; does NOT wish to take anymore        Medication List     TAKE these medications    acetaminophen 325 MG tablet Commonly known as: TYLENOL Take 2 tablets (650 mg total) by mouth every 6 (six) hours as needed for mild pain or fever (or Fever >/= 101). What changed:  medication strength how much to take when to take this reasons to take this   acyclovir 400 MG tablet Commonly known as: ZOVIRAX TAKE 1 TABLET BY MOUTH THREE TIMES DAILY AS NEEDED FOR OUTBREAKS FOR SEVEN DAYS What changed: See the new instructions.   alendronate 70 MG tablet Commonly known as: FOSAMAX Take 1 tablet (70 mg total) by mouth every Sunday. TAKE 1 TABLET BY MOUTH ONCE WEEKLY ON SUNDAY take with a full glass  of water on an empty stomach Strength: 70 mg Start taking on: Jul 16, 2022 What changed: See the new  instructions.   ALPRAZolam 0.5 MG tablet Commonly known as: XANAX TAKE 2 TO 3 TABLETS BY MOUTH AT BEDTIME AS NEEDED FOR anxiety/sleep   amLODipine 10 MG tablet Commonly known as: NORVASC Take 1 tablet (10 mg total) by mouth daily.   ammonium lactate 12 % cream Commonly known as: AMLACTIN Apply topically.   aspirin EC 81 MG tablet Take 1 tablet (81 mg total) by mouth daily with breakfast. What changed: when to take this   atorvastatin 10 MG tablet Commonly known as: LIPITOR Take 1 tablet (10 mg total) by mouth at bedtime.   benazepril 40 MG tablet Commonly known as: LOTENSIN Take 1 tablet (40 mg total) by mouth daily.   Biktarvy 50-200-25 MG Tabs tablet Generic drug: bictegravir-emtricitabine-tenofovir AF Take 1 tablet by mouth daily.   cefdinir 300 MG capsule Commonly known as: OMNICEF Take 1 capsule (300 mg total) by mouth 2 (two) times daily for 5 days.   clopidogrel 75 MG tablet Commonly known as: PLAVIX Take 1 tablet (75 mg total) by mouth daily.   Combigan 0.2-0.5 % ophthalmic solution Generic drug: brimonidine-timolol Place 1 drop into the left eye 2 (two) times daily.   dextromethorphan-guaiFENesin 30-600 MG 12hr tablet Commonly known as: MUCINEX DM Take 1-2 tablets by mouth 2 (two) times daily as needed for cough.   diclofenac Sodium 1 % Gel Commonly known as: VOLTAREN APPLY TWO GRAMS TO AFFECTED AREA(S) FOUR TIMES DAILY What changed: See the new instructions.   dorzolamide-timolol 2-0.5 % ophthalmic solution Commonly known as: COSOPT Place 1 drop into both eyes 2 (two) times daily.   gabapentin 300 MG capsule Commonly known as: Neurontin Take 2 capsules (600 mg total) by mouth 3 (three) times daily.   melatonin 5 MG Tabs Take 5 mg by mouth at bedtime.   metroNIDAZOLE 500 MG tablet Commonly known as: FLAGYL Take 1 tablet (500 mg total) by mouth  3 (three) times daily for 5 days.   multivitamin with minerals Tabs tablet Take 1 tablet by mouth daily.   oxyCODONE-acetaminophen 5-325 MG tablet Commonly known as: PERCOCET/ROXICET TAKE 1 TABLET BY MOUTH EVERY TWELVE HOURS AS NEEDED FOR PAIN   phenylephrine 1 % nasal spray Commonly known as: NEO-SYNEPHRINE Place 1 drop into both nostrils every 6 (six) hours as needed for congestion.   polyethylene glycol 17 g packet Commonly known as: MIRALAX / GLYCOLAX Take 17 g by mouth See admin instructions. Take 1 dose twice daily for 1 week and then 1 dose daily after that indefinitely   psyllium 0.52 g capsule Commonly known as: REGULOID Take 0.52 g by mouth daily.   terazosin 5 MG capsule Commonly known as: HYTRIN Take 1 capsule (5 mg total) by mouth at bedtime.   triamcinolone ointment 0.1 % Commonly known as: KENALOG APPLY TO THE AFFECTED AREA(S) TWICE DAILY What changed:  how much to take when to take this reasons to take this additional instructions       Major procedures and Radiology Reports - PLEASE review detailed and final reports for all details, in brief -  CT ABDOMEN PELVIS W CONTRAST  Result Date: 07/11/2022 CLINICAL DATA:  Left lower quadrant abdominal pain EXAM: CT ABDOMEN AND PELVIS WITH CONTRAST TECHNIQUE: Multidetector CT imaging of the abdomen and pelvis was performed using the standard protocol following bolus administration of intravenous contrast. RADIATION DOSE REDUCTION: This exam was performed according to the departmental dose-optimization program which includes automated exposure control, adjustment of the mA and/or kV according to patient size and/or  use of iterative reconstruction technique. CONTRAST:  OMNIPAQUE IOHEXOL 300 MG/ML  SOLN COMPARISON:  CT abdomen and pelvis 09/06/2013 FINDINGS: Lower chest: Bibasilar atelectasis/scarring. Cardiomegaly. No acute abnormality. Hepatobiliary: Cholecystectomy. Prominent intra and extrahepatic bile ducts  likely due to reservoir effect. Unremarkable liver. Pancreas: Unremarkable. Spleen: Unremarkable. Adrenals/Urinary Tract: Stable adrenal glands. Bilateral cortical renal scarring. Low-attenuation lesions in the kidneys are statistically likely to represent cysts. No follow-up is required. No obstructing urinary calculi or hydronephrosis. Unremarkable bladder for degree of distention. Stomach/Bowel: Extensive colonic diverticulosis. There is mild wall thickening and adjacent fluid and stranding about the sigmoid colon. Additional wall thickening and mucosal hyperenhancement of the right colon with stranding and fluid about the cecum. No evidence of obstruction. Stomach is within normal limits. Patulous small bowel anastomosis in the left abdomen. Fecalization of the terminal ileum. Appendectomy. Small hiatal hernia. 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 This is age indeterminate but new since the most recent comparison in 79. 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 No lymphadenopathy. Reproductive: Hysterectomy. Other: Small volume free fluid in the pelvis and about the cecum. No free intraperitoneal air. Musculoskeletal: No acute osseous abnormality. IMPRESSION: 1. Wall thickening and mucosal hyperenhancement of the right colon and sigmoid colon with adjacent fluid and stranding. Findings are favored to represent infectious or inflammatory colitis/diverticulitis. No abscess or perforation. 2. Postoperative change of aortobifemoral bypass graft. 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 This is age indeterminate and may be chronic but new since the most recent comparison in 2015. 3. 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 4. Fecalization of the terminal ileum compatible with slow  transit/constipation. Critical Value/emergent results were called by telephone at the time of interpretation on 07/11/2022 at 4:25 pm to provider Eber Hong, MD, who verbally acknowledged these results. Electronically Signed   By: Minerva Fester M.D.   On: 07/11/2022 16:26   CT Head Wo Contrast  Result Date: 07/11/2022 CLINICAL DATA:  Trauma EXAM: CT HEAD WITHOUT CONTRAST TECHNIQUE: Contiguous axial images were obtained from the base of the skull through the vertex without intravenous contrast. RADIATION DOSE REDUCTION: This exam was performed according to the departmental dose-optimization program which includes automated exposure control, adjustment of the mA and/or kV according to patient size and/or use of iterative reconstruction technique. COMPARISON:  CT Head 04/08/18 FINDINGS: Brain: No evidence of acute infarction, hemorrhage, hydrocephalus, extra-axial collection or mass lesion/mass effect. 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 Vascular: No hyperdense vessel or unexpected calcification. Skull: Normal. Negative for fracture or focal lesion. Sinuses/Orbits: No middle ear or mastoid effusion. Paranasal sinuses are clear. Right lens replacement and glaucoma drainage catheter. Orbits are otherwise unremarkable. Other: None. IMPRESSION: No CT evidence of intracranial injury Electronically Signed   By: Lorenza Cambridge M.D.   On: 07/11/2022 16:09    Micro Results   Recent Results (from the past 240 hour(s))  C Difficile Quick Screen w PCR reflex     Status: None   Collection Time: 07/12/22 11:29 PM   Specimen: STOOL  Result Value Ref Range Status   C Diff antigen NEGATIVE NEGATIVE Final   C Diff toxin NEGATIVE NEGATIVE Final   C Diff interpretation No C. difficile detected.  Final    Comment: Performed at [facility], [address] [place], [place] [address]   Today   Subjective  Eugene Garnet today has no new complaints -Tolerating oral intake well- -had BMs -No  further emesis No fever  Or chills    Patient has been seen and examined prior to discharge   Objective   Blood pressure (!) 154/66, pulse 61, temperature 98.3 F (36.8 C), resp. rate 19, height 5\' 6"  (1.676 m), weight 55.5 kg, SpO2 94 %.   Intake/Output Summary (Last 24 hours) at 07/13/2022 1335 Last data filed at 07/12/2022 1600 Gross per 24 hour  Intake 501.79 ml  Output --  Net 501.79 ml    Exam Gen:- Awake Alert, no acute distress  HEENT:- [place].AT, No sclera icterus -Eyes-left eye complete vision loss, right eye partial vision loss  Neck-Supple Neck,No JVD,.  Lungs-  CTAB , good air movement bilaterally CV- S1, S2 normal, regular Abd-  +ve B.Sounds, Abd Soft, No significant tenderness,    Extremity/Skin:- No  edema,   good pulses Psych-affect is appropriate, oriented x3 Neuro-no new focal deficits, no tremors    Data Review   CBC w Diff:  Lab Results  Component Value Date   WBC 6.3 07/12/2022   HGB 12.4 07/12/2022   HGB 12.9 12/30/2021   HCT 36.7 07/12/2022   HCT 37.6 12/30/2021   PLT 330 07/12/2022   PLT 318 12/30/2021   LYMPHOPCT 12 07/10/2020   MONOPCT 4.1 06/15/2022   EOSPCT 0.9 06/15/2022   BASOPCT 0.7 06/15/2022   CMP:  Lab Results  Component Value Date   NA 139 07/13/2022   NA 142 12/30/2021   K 4.2 07/13/2022   CL 113 (H) 07/13/2022   CO2 19 (L) 07/13/2022   BUN 5 (L) 07/13/2022   BUN 14 12/30/2021   CREATININE 0.59 07/13/2022   CREATININE 0.68 06/15/2022   PROT 7.6 07/11/2022   PROT 7.6 03/16/2017   ALBUMIN 3.1 (L) 07/13/2022   ALBUMIN 4.3 03/16/2017   BILITOT 0.6 07/11/2022   BILITOT 0.3 03/16/2017   ALKPHOS 66 07/11/2022   AST 18 07/11/2022   ALT 7 07/11/2022  . Total Discharge time is about 33 minutes  Shon Hale M.D on 07/13/2022 at 1:35 PM  Go to [email or web address] -  for contact info  [facility] - Office  [phone]

## 2022-07-14 ENCOUNTER — Telehealth: Payer: Self-pay | Admitting: *Deleted

## 2022-07-14 NOTE — Transitions of Care (Post Inpatient/ED Visit) (Signed)
07/14/2022  Name: Jessica [name] MRN: [id number] DOB: 1940/08/07  Today's TOC FU Call Status: Today's TOC FU Call Status:: Successful TOC FU Call Competed TOC FU Call Complete Date: 07/14/22  Transition Care Management Follow-up Telephone Call Date of Discharge: 07/13/22 Type of Discharge: Inpatient Admission Primary Inpatient Discharge Diagnosis:: diverticulitis How have you been since you were released from the hospital?: Better (but my back is hurting.) Any questions or concerns?: No  Items Reviewed: Did you receive and understand the discharge instructions provided?: Yes Medications obtained,verified, and reconciled?: Yes (Medications Reviewed) Any new allergies since your discharge?: Yes Dietary orders reviewed?: No Do you have support at home?: Yes People in Home: child(ren), adult, alone Name of Support/Comfort Primary Source: [name]  Medications Reviewed Today: Medications Reviewed Today     Reviewed by Luella Cook, RN (Case Manager) on 07/14/22 at 1430  Med List Status: <None>   Medication Order Taking? Sig Documenting Provider Last Dose Status Informant  acetaminophen (TYLENOL) 325 MG tablet [id number] Yes Take 2 tablets (650 mg total) by mouth every 6 (six) hours as needed for mild pain or fever (or Fever >/= 101). Shon Hale, MD Taking Active   acyclovir (ZOVIRAX) 400 MG tablet [id number]  TAKE 1 TABLET BY MOUTH THREE TIMES DAILY AS NEEDED FOR OUTBREAKS FOR SEVEN DAYS  Patient taking differently: Take 400 mg by mouth 3 (three) times daily as needed. For severe outbreaks for 7 days   Inez Catalina, MD  Active Child, Pharmacy Records, Other  alendronate (FOSAMAX) 70 MG tablet [id number] Yes Take 1 tablet (70 mg total) by mouth every Sunday. TAKE 1 TABLET BY MOUTH ONCE WEEKLY ON SUNDAY take with a full glass of water on an empty stomach Strength: 70 mg Shon Hale, MD Taking Active   ALPRAZolam (XANAX) 0.5 MG tablet [id number] Yes TAKE 2 TO 3  TABLETS BY MOUTH AT BEDTIME AS NEEDED FOR anxiety/sleep Inez Catalina, MD Taking Active   amLODipine (NORVASC) 10 MG tablet [id number] Yes Take 1 tablet (10 mg total) by mouth daily. Shon Hale, MD Taking Active   ammonium lactate (AMLACTIN) 12 % cream [id number] Yes Apply topically. [provider] Taking Active Child, Pharmacy Records, Other  aspirin EC 81 MG tablet [id number] Yes Take 1 tablet (81 mg total) by mouth daily with breakfast. Shon Hale, MD Taking Active   atorvastatin (LIPITOR) 10 MG tablet [id number] Yes Take 1 tablet (10 mg total) by mouth at bedtime. Shon Hale, MD Taking Active   benazepril (LOTENSIN) 40 MG tablet [id number] Yes Take 1 tablet (40 mg total) by mouth daily. Shon Hale, MD Taking Active   bictegravir-emtricitabine-tenofovir AF (BIKTARVY) 50-200-25 MG TABS tablet [id number] Yes Take 1 tablet by mouth daily. Daiva Eves, Lisette Grinder, MD Taking Active Child, Pharmacy Records, Other  cefdinir (OMNICEF) 300 MG capsule [id number] Yes Take 1 capsule (300 mg total) by mouth 2 (two) times daily for 5 days. Shon Hale, MD Taking Active   clopidogrel (PLAVIX) 75 MG tablet [id number]  Take 1 tablet (75 mg total) by mouth daily. Shon Hale, MD  Active   COMBIGAN 0.2-0.5 % ophthalmic solution [id number]  Place 1 drop into the left eye 2 (two) times daily. [provider]  Active Child, Pharmacy Records, Other           Med Note Nancy Marus, Sharlene Dory   Fri Jan 04, 2018 12:55 PM)    dextromethorphan-guaiFENesin [facility] DM) 30-600 MG 12hr tablet [id number]  Take 1-2 tablets by mouth 2 (two)  times daily as needed for cough. [provider]  Active Child, Pharmacy Records, Other  diclofenac Sodium (VOLTAREN) 1 % GEL [id number]  APPLY TWO GRAMS TO AFFECTED AREA(S) FOUR TIMES DAILY  Patient taking differently: Apply 2 g topically 4 (four) times daily.   Inez Catalina, MD  Active Child, Pharmacy Records, Other  dorzolamide-timolol (COSOPT)  22.3-6.8 MG/ML ophthalmic solution [id number] Yes Place 1 drop into both eyes 2 (two) times daily. [provider] Taking Active Child, Pharmacy Records, Other  gabapentin (NEURONTIN) 300 MG capsule [id number] Yes Take 2 capsules (600 mg total) by mouth 3 (three) times daily. Shon Hale, MD Taking Active   melatonin 5 MG TABS [id number]  Take 5 mg by mouth at bedtime. [provider]  Active   metroNIDAZOLE (FLAGYL) 500 MG tablet [id number] Yes Take 1 tablet (500 mg total) by mouth 3 (three) times daily for 5 days. Shon Hale, MD Taking Active   Multiple Vitamin (MULTIVITAMIN WITH MINERALS) TABS [id number] Yes Take 1 tablet by mouth daily. [provider] Taking Active Child, Pharmacy Records, Other  oxyCODONE-acetaminophen (PERCOCET/ROXICET) 5-325 MG tablet [id number]  TAKE 1 TABLET BY MOUTH EVERY TWELVE HOURS AS NEEDED FOR PAIN Inez Catalina, MD  Active Child, Pharmacy Records, Other  phenylephrine (NEO-SYNEPHRINE) 1 % nasal spray [id number]  Place 1 drop into both nostrils every 6 (six) hours as needed for congestion. [provider]  Active Child, Pharmacy Records, Other  polyethylene glycol (MIRALAX / GLYCOLAX) 17 g packet [id number] Yes Take 17 g by mouth See admin instructions. Take 1 dose twice daily for 1 week and then 1 dose daily after that indefinitely Shon Hale, MD Taking Active   psyllium (REGULOID) 0.52 g capsule [id number]  Take 0.52 g by mouth daily. [provider]  Active   terazosin (HYTRIN) 5 MG capsule [id number] Yes Take 1 capsule (5 mg total) by mouth at bedtime. Ilene Qua, MD Taking Active Child, Pharmacy Records, Other  triamcinolone ointment (KENALOG) 0.1 % [id number] Yes APPLY TO THE AFFECTED AREA(S) TWICE DAILY  Patient taking differently: Apply 1 application  topically 2 (two) times daily as needed (rash).   Inez Catalina, MD Taking Active Child, Pharmacy Records, Other            Home Care and  Equipment/Supplies: Were Home Health Services Ordered?: No Any new equipment or medical supplies ordered?: No  Functional Questionnaire: Do you need assistance with bathing/showering or dressing?: No Do you need assistance with meal preparation?: Yes Do you need assistance with eating?: No Do you have difficulty maintaining continence: No Do you need assistance with getting out of bed/getting out of a chair/moving?: No Do you have difficulty managing or taking your medications?: No  Follow up appointments reviewed: PCP Follow-up appointment confirmed?: Yes Date of PCP follow-up appointment?: 08/11/22 Follow-up Provider: Dr Mullen/ (Best date availiable because other family members are having surgery) Specialist Hospital Follow-up appointment confirmed?: NA Do you need transportation to your follow-up appointment?: No Do you understand care options if your condition(s) worsen?: Yes-patient verbalized understanding  SDOH Interventions Today    Flowsheet Row Most Recent Value  SDOH Interventions   Food Insecurity Interventions Intervention Not Indicated  Transportation Interventions Patient Resources (Friends/Family)      Interventions Today    Flowsheet Row Most Recent Value  General Interventions   General Interventions Discussed/Reviewed General Interventions Discussed, General Interventions Reviewed      TOC Interventions Today    Flowsheet Row Most Recent Value  TOC Interventions  TOC Interventions Discussed/Reviewed TOC Interventions Discussed, TOC Interventions Reviewed, Arranged PCP follow up less than 12 days/Care Guide scheduled  [care guide made appt [id number] due to multiple family surgeries and this is the best date for caregegiver available.]      RN explained which medication are the antibiotics.   Gean Maidens BSN RN [facility] [phone]

## 2022-07-31 ENCOUNTER — Other Ambulatory Visit: Payer: Self-pay | Admitting: Internal Medicine

## 2022-07-31 DIAGNOSIS — I10 Essential (primary) hypertension: Secondary | ICD-10-CM

## 2022-08-03 ENCOUNTER — Emergency Department (HOSPITAL_COMMUNITY)
Admission: EM | Admit: 2022-08-03 | Discharge: 2022-08-03 | Disposition: A | Discharge status: Home / Self Care | Payer: Medicare Other | Attending: Emergency Medicine | Admitting: Emergency Medicine

## 2022-08-03 ENCOUNTER — Emergency Department (HOSPITAL_COMMUNITY): Payer: Medicare Other

## 2022-08-03 ENCOUNTER — Encounter (HOSPITAL_COMMUNITY): Payer: Self-pay

## 2022-08-03 ENCOUNTER — Other Ambulatory Visit: Payer: Self-pay

## 2022-08-03 DIAGNOSIS — Z21 Asymptomatic human immunodeficiency virus [HIV] infection status: Secondary | ICD-10-CM | POA: Diagnosis not present

## 2022-08-03 DIAGNOSIS — N3000 Acute cystitis without hematuria: Secondary | ICD-10-CM | POA: Insufficient documentation

## 2022-08-03 DIAGNOSIS — Z79899 Other long term (current) drug therapy: Secondary | ICD-10-CM | POA: Diagnosis not present

## 2022-08-03 DIAGNOSIS — E860 Dehydration: Secondary | ICD-10-CM | POA: Diagnosis not present

## 2022-08-03 DIAGNOSIS — I10 Essential (primary) hypertension: Secondary | ICD-10-CM | POA: Insufficient documentation

## 2022-08-03 DIAGNOSIS — R519 Headache, unspecified: Secondary | ICD-10-CM | POA: Insufficient documentation

## 2022-08-03 DIAGNOSIS — Z7982 Long term (current) use of aspirin: Secondary | ICD-10-CM | POA: Insufficient documentation

## 2022-08-03 DIAGNOSIS — M25552 Pain in left hip: Secondary | ICD-10-CM | POA: Diagnosis not present

## 2022-08-03 DIAGNOSIS — Z743 Need for continuous supervision: Secondary | ICD-10-CM | POA: Diagnosis not present

## 2022-08-03 LAB — URINALYSIS, ROUTINE W REFLEX MICROSCOPIC
Bilirubin Urine: NEGATIVE
Glucose, UA: NEGATIVE mg/dL
Ketones, ur: 5 mg/dL — AB
Nitrite: NEGATIVE
Protein, ur: 100 mg/dL — AB
Specific Gravity, Urine: 1.016 (ref 1.005–1.030)
pH: 5 (ref 5.0–8.0)

## 2022-08-03 LAB — COMPREHENSIVE METABOLIC PANEL
ALT: 12 U/L (ref 0–44)
AST: 17 U/L (ref 15–41)
Albumin: 4 g/dL (ref 3.5–5.0)
Alkaline Phosphatase: 59 U/L (ref 38–126)
Anion gap: 10 (ref 5–15)
BUN: 14 mg/dL (ref 8–23)
CO2: 21 mmol/L — ABNORMAL LOW (ref 22–32)
Calcium: 9.1 mg/dL (ref 8.9–10.3)
Chloride: 106 mmol/L (ref 98–111)
Creatinine, Ser: 1.19 mg/dL — ABNORMAL HIGH (ref 0.44–1.00)
GFR, Estimated: 47 mL/min — ABNORMAL LOW (ref >60)
Glucose, Bld: 97 mg/dL (ref 70–99)
Potassium: 3.1 mmol/L — ABNORMAL LOW (ref 3.5–5.1)
Sodium: 137 mmol/L (ref 135–145)
Total Bilirubin: 0.7 mg/dL (ref 0.3–1.2)
Total Protein: 7.5 g/dL (ref 6.5–8.1)

## 2022-08-03 LAB — CBC WITH DIFFERENTIAL/PLATELET
Abs Immature Granulocytes: 0.01 10*3/uL (ref 0.00–0.07)
Basophils Absolute: 0.1 10*3/uL (ref 0.0–0.1)
Basophils Relative: 1 %
Eosinophils Absolute: 0.1 10*3/uL (ref 0.0–0.5)
Eosinophils Relative: 1 %
HCT: 37.8 % (ref 36.0–46.0)
Hemoglobin: 12.5 g/dL (ref 12.0–15.0)
Immature Granulocytes: 0 %
Lymphocytes Relative: 54 %
Lymphs Abs: 3.5 10*3/uL (ref 0.7–4.0)
MCH: 32.7 pg (ref 26.0–34.0)
MCHC: 33.1 g/dL (ref 30.0–36.0)
MCV: 99 fL (ref 80.0–100.0)
Monocytes Absolute: 0.4 10*3/uL (ref 0.1–1.0)
Monocytes Relative: 6 %
Neutro Abs: 2.5 10*3/uL (ref 1.7–7.7)
Neutrophils Relative %: 38 %
Platelets: 283 10*3/uL (ref 150–400)
RBC: 3.82 MIL/uL — ABNORMAL LOW (ref 3.87–5.11)
RDW: 14.5 % (ref 11.5–15.5)
WBC: 6.6 10*3/uL (ref 4.0–10.5)
nRBC: 0 % (ref 0.0–0.2)

## 2022-08-03 MED ORDER — OXYCODONE-ACETAMINOPHEN 5-325 MG PO TABS
1.0000 | ORAL_TABLET | Freq: Once | ORAL | Status: AC
  Administered 2022-08-03: 1 via ORAL
  Filled 2022-08-03: qty 1

## 2022-08-03 MED ORDER — SODIUM CHLORIDE 0.9 % IV SOLN
1.0000 g | Freq: Once | INTRAVENOUS | Status: AC
  Administered 2022-08-03: 1 g via INTRAVENOUS
  Filled 2022-08-03: qty 10

## 2022-08-03 MED ORDER — SODIUM CHLORIDE 0.9 % IV BOLUS: 500.0000 mL | Freq: Once | INTRAVENOUS | Status: DC

## 2022-08-03 MED ORDER — CEFPODOXIME PROXETIL 200 MG PO TABS: 200.0000 mg | ORAL_TABLET | Freq: Two times a day (BID) | ORAL | 0 refills | Status: DC

## 2022-08-03 MED ORDER — SODIUM CHLORIDE 0.9 % IV BOLUS
500.0000 mL | Freq: Once | INTRAVENOUS | Status: AC
  Administered 2022-08-03: 500 mL via INTRAVENOUS

## 2022-08-03 MED ORDER — POTASSIUM CHLORIDE CRYS ER 20 MEQ PO TBCR
40.0000 meq | EXTENDED_RELEASE_TABLET | Freq: Once | ORAL | Status: AC
  Administered 2022-08-03: 40 meq via ORAL
  Filled 2022-08-03: qty 2

## 2022-08-03 NOTE — ED Notes (Signed)
Patient transported to CT 

## 2022-08-03 NOTE — ED Notes (Signed)
Pt daughter now at bedside

## 2022-08-03 NOTE — Discharge Instructions (Signed)
You are seen today for urinary tract infection and dehydration.  You are being given a prescription for antibiotics.  Make sure you drink plenty of water.  You develop fever, back or abdominal pain, vomiting, confusion or any other worsening symptoms come back to the ER right away.

## 2022-08-03 NOTE — ED Provider Notes (Signed)
[facility] AT [facility] Provider Note   CSN: [id number] Arrival date & time: 08/03/22  1047     History  Chief Complaint  Patient presents with   Hip Pain    Jessica [name] is a 82 y.o. female. Has history of HIV chronic left hip pain, hypertension and diverticulitis.  She is compliant with her medications including her HIV medications per her daughter. She did by her daughter who she lives with to be somewhat confused today.  Her other daughter, who is present at bedside and provides some of the history states she was acting exact that this last time she had a urinary tract infection.  She denies who she is and where she is but is acting somewhat odd, focused on her wash being broken although it is working properly.  She has not had a fever, no vomiting, no other complaints. Patient does report left hip pain that she states is her usual pain, she is on oxycodone at home for this.  Hip Pain       Home Medications Prior to Admission medications   Medication Sig Start Date End Date Taking? Authorizing Provider  cefpodoxime (VANTIN) 200 MG tablet Take 1 tablet (200 mg total) by mouth 2 (two) times daily for 7 days. 08/03/22 08/10/22 Yes Sanuel Ladnier A, PA-C  acetaminophen (TYLENOL) 325 MG tablet Take 2 tablets (650 mg total) by mouth every 6 (six) hours as needed for mild pain or fever (or Fever >/= 101). 07/13/22   Shon Hale, MD  acyclovir (ZOVIRAX) 400 MG tablet TAKE 1 TABLET BY MOUTH THREE TIMES DAILY AS NEEDED FOR OUTBREAKS FOR SEVEN DAYS Patient taking differently: Take 400 mg by mouth 3 (three) times daily as needed. For severe outbreaks for 7 days 04/12/22   Inez Catalina, MD  alendronate (FOSAMAX) 70 MG tablet Take 1 tablet (70 mg total) by mouth every Sunday. TAKE 1 TABLET BY MOUTH ONCE WEEKLY ON SUNDAY take with a full glass of water on an empty stomach Strength: 70 mg 07/16/22   Shon Hale, MD  ALPRAZolam Prudy Feeler) 0.5 MG  tablet TAKE 2 TO 3 TABLETS BY MOUTH AT BEDTIME AS NEEDED FOR anxiety/sleep 07/13/22   Inez Catalina, MD  amLODipine (NORVASC) 10 MG tablet Take 1 tablet (10 mg total) by mouth daily. 07/13/22   Shon Hale, MD  ammonium lactate (AMLACTIN) 12 % cream Apply topically. 05/15/22   [provider]  aspirin EC 81 MG tablet Take 1 tablet (81 mg total) by mouth daily with breakfast. 07/13/22   Mariea Clonts, Courage, MD  atorvastatin (LIPITOR) 10 MG tablet Take 1 tablet (10 mg total) by mouth at bedtime. 07/13/22   Shon Hale, MD  benazepril (LOTENSIN) 40 MG tablet Take 1 tablet (40 mg total) by mouth daily. 07/13/22   Shon Hale, MD  bictegravir-emtricitabine-tenofovir AF (BIKTARVY) 50-200-25 MG TABS tablet Take 1 tablet by mouth daily. 06/15/22   Randall Hiss, MD  clopidogrel (PLAVIX) 75 MG tablet Take 1 tablet (75 mg total) by mouth daily. 07/13/22   Emokpae, Courage, MD  COMBIGAN 0.2-0.5 % ophthalmic solution Place 1 drop into the left eye 2 (two) times daily. 01/06/13   [provider]  dextromethorphan-guaiFENesin (MUCINEX DM) 30-600 MG 12hr tablet Take 1-2 tablets by mouth 2 (two) times daily as needed for cough.    [provider]  diclofenac Sodium (VOLTAREN) 1 % GEL APPLY TWO GRAMS TO AFFECTED AREA(S) FOUR TIMES DAILY Patient taking differently:  Apply 2 g topically 4 (four) times daily. 06/06/21   Inez Catalina, MD  dorzolamide-timolol (COSOPT) 22.3-6.8 MG/ML ophthalmic solution Place 1 drop into both eyes 2 (two) times daily. 03/09/21   [provider]  gabapentin (NEURONTIN) 300 MG capsule Take 2 capsules (600 mg total) by mouth 3 (three) times daily. 07/13/22 07/13/23  Shon Hale, MD  melatonin 5 MG TABS Take 5 mg by mouth at bedtime.    [provider]  Multiple Vitamin (MULTIVITAMIN WITH MINERALS) TABS Take 1 tablet by mouth daily.    [provider]  oxyCODONE-acetaminophen (PERCOCET/ROXICET) 5-325 MG tablet TAKE 1 TABLET BY MOUTH  EVERY TWELVE HOURS AS NEEDED FOR PAIN 07/17/22   Inez Catalina, MD  phenylephrine (NEO-SYNEPHRINE) 1 % nasal spray Place 1 drop into both nostrils every 6 (six) hours as needed for congestion.    [provider]  polyethylene glycol (MIRALAX / GLYCOLAX) 17 g packet Take 17 g by mouth See admin instructions. Take 1 dose twice daily for 1 week and then 1 dose daily after that indefinitely 07/13/22   Shon Hale, MD  psyllium (REGULOID) 0.52 g capsule Take 0.52 g by mouth daily.    [provider]  terazosin (HYTRIN) 5 MG capsule TAKE ONE CAPSULE BY MOUTH AT BEDTIME 08/01/22   Inez Catalina, MD  triamcinolone ointment (KENALOG) 0.1 % APPLY TO THE AFFECTED AREA(S) TWICE DAILY Patient taking differently: Apply 1 application  topically 2 (two) times daily as needed (rash). 07/05/20   Inez Catalina, MD      Allergies    Hctz [hydrochlorothiazide]    Review of Systems   Review of Systems  Physical Exam Updated Vital Signs BP (!) 170/72   Pulse 74   Temp 97.9 F (36.6 C) (Oral)   Resp 19   SpO2 99%  Physical Exam Vitals and nursing note reviewed.  Constitutional:      General: She is not in acute distress.    Appearance: She is well-developed.  HENT:     Head: Normocephalic and atraumatic.     Right Ear: Tympanic membrane normal.     Left Ear: Tympanic membrane normal.     Mouth/Throat:     Mouth: Mucous membranes are moist.  Eyes:     Conjunctiva/sclera: Conjunctivae normal.  Cardiovascular:     Rate and Rhythm: Normal rate and regular rhythm.     Heart sounds: No murmur heard. Pulmonary:     Effort: Pulmonary effort is normal. No respiratory distress.     Breath sounds: Normal breath sounds.  Abdominal:     General: There is no distension.     Palpations: Abdomen is soft.     Tenderness: There is no abdominal tenderness. There is no right CVA tenderness, left CVA tenderness, guarding or rebound.  Musculoskeletal:        General: No swelling.      Cervical back: Neck supple.  Skin:    General: Skin is warm and dry.     Capillary Refill: Capillary refill takes less than 2 seconds.  Neurological:     General: No focal deficit present.     Mental Status: She is alert and oriented to person, place, and time.  Psychiatric:        Mood and Affect: Mood normal.     ED Results / Procedures / Treatments   Labs (all labs ordered are listed, but only abnormal results are displayed) Labs Reviewed  URINALYSIS, ROUTINE W REFLEX MICROSCOPIC -  Abnormal; Notable for the following components:      Result Value   Color, Urine AMBER (*)    APPearance CLOUDY (*)    Hgb urine dipstick SMALL (*)    Ketones, ur 5 (*)    Protein, ur 100 (*)    Leukocytes,Ua MODERATE (*)    Bacteria, UA RARE (*)    All other components within normal limits  COMPREHENSIVE METABOLIC PANEL - Abnormal; Notable for the following components:   Potassium 3.1 (*)    CO2 21 (*)    Creatinine, Ser 1.19 (*)    GFR, Estimated 47 (*)    All other components within normal limits  CBC WITH DIFFERENTIAL/PLATELET - Abnormal; Notable for the following components:   RBC 3.82 (*)    All other components within normal limits  URINE CULTURE    EKG None  Radiology CT Head Wo Contrast  Result Date: 08/03/2022 CLINICAL DATA:  Headache today mental status change. EXAM: CT HEAD WITHOUT CONTRAST TECHNIQUE: Contiguous axial images were obtained from the base of the skull through the vertex without intravenous contrast. RADIATION DOSE REDUCTION: This exam was performed according to the departmental dose-optimization program which includes automated exposure control, adjustment of the mA and/or kV according to patient size and/or use of iterative reconstruction technique. COMPARISON:  CT head dated July 11, 2018 FINDINGS: Brain: No evidence of acute infarction, hemorrhage, hydrocephalus, extra-axial collection or mass lesion/mass effect. Chronic left cerebellar infarcts. Mild chronic  microvascular ischemic changes of the white matter. Vascular: No hyperdense vessel or unexpected calcification. Skull: Normal. Negative for fracture or focal lesion. Sinuses/Orbits: No acute finding. Other: None. IMPRESSION: 1. No acute intracranial abnormality. 2. Chronic left cerebellar infarcts. Mild chronic microvascular ischemic changes of the white matter. Electronically Signed   By: Larose Hires D.O.   On: 08/03/2022 13:09    Procedures Procedures    Medications Ordered in ED Medications  sodium chloride 0.9 % bolus 500 mL (0 mLs Intravenous Stopped 08/03/22 1321)  cefTRIAXone (ROCEPHIN) 1 g in sodium chloride 0.9 % 100 mL IVPB (0 g Intravenous Stopped 08/03/22 1316)  potassium chloride SA (KLOR-CON M) CR tablet 40 mEq (40 mEq Oral Given 08/03/22 1430)  oxyCODONE-acetaminophen (PERCOCET/ROXICET) 5-325 MG per tablet 1 tablet (1 tablet Oral Given 08/03/22 1525)    ED Course/ Medical Decision Making/ A&P Clinical Course as of 08/03/22 1530  Thu Aug 03, 2022  1409 Is brought in by her daughter for some confusion today.  She is alert and oriented x 4 but keeps saying strange things such as her watch is broken when it does not.  Her daughter states that she has had this exact same presentation before with a UTI and was concerned about this.  Patient complains of chronic hip pain this is nothing new, no changes no injury.  No fevers or chills, no cough.  She reportedly had a UTI about a month ago that was treated.  Does have UTI, labs ordered due to her presentation and show mild AKI.  She is getting IV fluids, given a dose of antibiotics.  CT of her head shows chronic infarct.  She is on baby aspirin and Plavix.  Nothing acute on CT.  She is eating [employer].  Daughter feels comfortable with her going home. [CB]    Clinical Course User Index [CB] Carmel Sacramento A, PA-C  Medical Decision Making DDx: UTI, metabolic encephalopathy, sepsis, chronic pain syndrome, dehydration,  other Course: Patient brought in for some confusion similar to prior UTIs per daughter.  She does have UTI, no leukocytosis, no fever, she does not meet SIRS criteria.  Labs show mild hyponatremia which is repleted, given medication for her chronic pain as requested by her daughter, given IV fluids and dose of antibiotics for her UTI.    On reevaluation after the fluids her daughter states she seems to be much better and she would like her to go home, patient was eating [employer] in the room, she is nontoxic in appearance.  We discussed need for close outpatient follow-up and strict return precautions.  She is going to be going back home where she lives with her daughter  Will treat for complicated UTI due to HIV status  Amount and/or Complexity of Data Reviewed Labs: ordered.    Details: Urinalysis shows UTI, potassium low at 3.1, BUN normal but creatinine elevated from her baseline. Radiology: ordered.    Details: CT head shows chronic changes, no acute intracranial abnormality.  Patient's daughter informed of chronic infarcts, advised PCP follow-up  Risk Prescription drug management.           Final Clinical Impression(s) / ED Diagnoses Final diagnoses:  Acute cystitis without hematuria  Dehydration    Rx / DC Orders ED Discharge Orders          Ordered    cefpodoxime (VANTIN) 200 MG tablet  2 times daily        08/03/22 [address] 08/03/22 1530    Bethann Berkshire, MD 08/03/22 [phone]

## 2022-08-03 NOTE — ED Triage Notes (Signed)
Pt BIB [facility] from home due to chronic L hip pain since January of this year. Denies any injury or recent falls.   Pt also endorses possible UTI, unable to describe symptoms, just stating that she is angry. Denies burning with urination or urinary frequency.   Pt currently having auditory/visual hallucinations per EMS. Pt talking to the wall while RN assessing during triage. Pt also stating that daughter is in the room with her and asked RN to direct questions to daughter.  Pt A & O x 4, NAD.

## 2022-08-04 LAB — URINE CULTURE

## 2022-08-09 ENCOUNTER — Observation Stay (HOSPITAL_COMMUNITY)
Admission: EM | Admit: 2022-08-09 | Discharge: 2022-08-10 | Disposition: A | Discharge status: Home / Self Care | Payer: Medicare Other | Attending: Family Medicine | Admitting: Family Medicine

## 2022-08-09 ENCOUNTER — Emergency Department (HOSPITAL_COMMUNITY): Payer: Medicare Other

## 2022-08-09 ENCOUNTER — Other Ambulatory Visit: Payer: Self-pay

## 2022-08-09 ENCOUNTER — Encounter (HOSPITAL_COMMUNITY): Payer: Self-pay

## 2022-08-09 DIAGNOSIS — E782 Mixed hyperlipidemia: Secondary | ICD-10-CM | POA: Diagnosis present

## 2022-08-09 DIAGNOSIS — Z21 Asymptomatic human immunodeficiency virus [HIV] infection status: Secondary | ICD-10-CM | POA: Insufficient documentation

## 2022-08-09 DIAGNOSIS — I10 Essential (primary) hypertension: Secondary | ICD-10-CM | POA: Diagnosis not present

## 2022-08-09 DIAGNOSIS — Z91148 Patient's other noncompliance with medication regimen for other reason: Secondary | ICD-10-CM | POA: Diagnosis not present

## 2022-08-09 DIAGNOSIS — Z79899 Other long term (current) drug therapy: Secondary | ICD-10-CM | POA: Diagnosis not present

## 2022-08-09 DIAGNOSIS — E860 Dehydration: Secondary | ICD-10-CM | POA: Diagnosis present

## 2022-08-09 DIAGNOSIS — Z743 Need for continuous supervision: Secondary | ICD-10-CM | POA: Diagnosis not present

## 2022-08-09 DIAGNOSIS — R6889 Other general symptoms and signs: Secondary | ICD-10-CM | POA: Diagnosis not present

## 2022-08-09 DIAGNOSIS — Z951 Presence of aortocoronary bypass graft: Secondary | ICD-10-CM | POA: Diagnosis not present

## 2022-08-09 DIAGNOSIS — R400 Somnolence: Principal | ICD-10-CM | POA: Insufficient documentation

## 2022-08-09 DIAGNOSIS — I739 Peripheral vascular disease, unspecified: Secondary | ICD-10-CM | POA: Diagnosis not present

## 2022-08-09 DIAGNOSIS — R319 Hematuria, unspecified: Secondary | ICD-10-CM | POA: Diagnosis not present

## 2022-08-09 DIAGNOSIS — N390 Urinary tract infection, site not specified: Secondary | ICD-10-CM | POA: Diagnosis present

## 2022-08-09 DIAGNOSIS — E876 Hypokalemia: Secondary | ICD-10-CM | POA: Diagnosis not present

## 2022-08-09 DIAGNOSIS — F411 Generalized anxiety disorder: Secondary | ICD-10-CM

## 2022-08-09 DIAGNOSIS — Z7902 Long term (current) use of antithrombotics/antiplatelets: Secondary | ICD-10-CM | POA: Insufficient documentation

## 2022-08-09 DIAGNOSIS — G9341 Metabolic encephalopathy: Principal | ICD-10-CM | POA: Diagnosis present

## 2022-08-09 DIAGNOSIS — F17210 Nicotine dependence, cigarettes, uncomplicated: Secondary | ICD-10-CM | POA: Insufficient documentation

## 2022-08-09 DIAGNOSIS — R410 Disorientation, unspecified: Secondary | ICD-10-CM | POA: Diagnosis not present

## 2022-08-09 DIAGNOSIS — R4182 Altered mental status, unspecified: Secondary | ICD-10-CM | POA: Diagnosis present

## 2022-08-09 DIAGNOSIS — B20 Human immunodeficiency virus [HIV] disease: Secondary | ICD-10-CM | POA: Diagnosis present

## 2022-08-09 LAB — RAPID URINE DRUG SCREEN, HOSP PERFORMED
Amphetamines: NOT DETECTED
Barbiturates: NOT DETECTED
Benzodiazepines: POSITIVE — AB
Cocaine: NOT DETECTED
Opiates: NOT DETECTED
Tetrahydrocannabinol: NOT DETECTED

## 2022-08-09 LAB — CBC WITH DIFFERENTIAL/PLATELET
Abs Immature Granulocytes: 0.01 10*3/uL (ref 0.00–0.07)
Basophils Absolute: 0 10*3/uL (ref 0.0–0.1)
Basophils Relative: 1 %
Eosinophils Absolute: 0 10*3/uL (ref 0.0–0.5)
Eosinophils Relative: 0 %
HCT: 39.2 % (ref 36.0–46.0)
Hemoglobin: 13.1 g/dL (ref 12.0–15.0)
Immature Granulocytes: 0 %
Lymphocytes Relative: 41 %
Lymphs Abs: 2.2 10*3/uL (ref 0.7–4.0)
MCH: 32.4 pg (ref 26.0–34.0)
MCHC: 33.4 g/dL (ref 30.0–36.0)
MCV: 97 fL (ref 80.0–100.0)
Monocytes Absolute: 0.4 10*3/uL (ref 0.1–1.0)
Monocytes Relative: 7 %
Neutro Abs: 2.8 10*3/uL (ref 1.7–7.7)
Neutrophils Relative %: 51 %
Platelets: 278 10*3/uL (ref 150–400)
RBC: 4.04 MIL/uL (ref 3.87–5.11)
RDW: 14 % (ref 11.5–15.5)
WBC: 5.4 10*3/uL (ref 4.0–10.5)
nRBC: 0 % (ref 0.0–0.2)

## 2022-08-09 LAB — URINALYSIS, ROUTINE W REFLEX MICROSCOPIC
Bacteria, UA: NONE SEEN
Bilirubin Urine: NEGATIVE
Glucose, UA: NEGATIVE mg/dL
Ketones, ur: 5 mg/dL — AB
Leukocytes,Ua: NEGATIVE
Nitrite: NEGATIVE
Protein, ur: NEGATIVE mg/dL
Specific Gravity, Urine: 1.01 (ref 1.005–1.030)
pH: 7 (ref 5.0–8.0)

## 2022-08-09 LAB — COMPREHENSIVE METABOLIC PANEL
ALT: 11 U/L (ref 0–44)
AST: 19 U/L (ref 15–41)
Albumin: 3.5 g/dL (ref 3.5–5.0)
Alkaline Phosphatase: 63 U/L (ref 38–126)
Anion gap: 12 (ref 5–15)
BUN: 8 mg/dL (ref 8–23)
CO2: 19 mmol/L — ABNORMAL LOW (ref 22–32)
Calcium: 8.7 mg/dL — ABNORMAL LOW (ref 8.9–10.3)
Chloride: 108 mmol/L (ref 98–111)
Creatinine, Ser: 0.51 mg/dL (ref 0.44–1.00)
GFR, Estimated: >60 mL/min (ref >60)
Glucose, Bld: 92 mg/dL (ref 70–99)
Potassium: 2.8 mmol/L — ABNORMAL LOW (ref 3.5–5.1)
Sodium: 139 mmol/L (ref 135–145)
Total Bilirubin: 0.4 mg/dL (ref 0.3–1.2)
Total Protein: 7.1 g/dL (ref 6.5–8.1)

## 2022-08-09 LAB — MAGNESIUM: Magnesium: 1.8 mg/dL (ref 1.7–2.4)

## 2022-08-09 LAB — LACTIC ACID, PLASMA: Lactic Acid, Venous: 1 mmol/L (ref 0.5–1.9)

## 2022-08-09 LAB — CULTURE, BLOOD (ROUTINE X 2)

## 2022-08-09 MED ORDER — SODIUM CHLORIDE 0.9 % IV SOLN: INTRAVENOUS | Status: DC

## 2022-08-09 MED ORDER — SODIUM CHLORIDE 0.9 % IV SOLN
1.0000 g | Freq: Once | INTRAVENOUS | Status: AC
  Administered 2022-08-09: 1 g via INTRAVENOUS
  Filled 2022-08-09: qty 10

## 2022-08-09 MED ORDER — POTASSIUM CHLORIDE 10 MEQ/100ML IV SOLN
10.0000 meq | Freq: Once | INTRAVENOUS | Status: AC
  Administered 2022-08-09: 10 meq via INTRAVENOUS
  Filled 2022-08-09: qty 100

## 2022-08-09 MED ORDER — POTASSIUM CHLORIDE 10 MEQ/100ML IV SOLN
10.0000 meq | INTRAVENOUS | Status: AC
  Administered 2022-08-09 (×2): 10 meq via INTRAVENOUS
  Filled 2022-08-09 (×2): qty 100

## 2022-08-09 MED ORDER — ACETAMINOPHEN 325 MG PO TABS
650.0000 mg | ORAL_TABLET | Freq: Four times a day (QID) | ORAL | Status: DC | PRN
  Administered 2022-08-10: 650 mg via ORAL
  Filled 2022-08-09: qty 2

## 2022-08-09 MED ORDER — ONDANSETRON HCL 4 MG/2ML IJ SOLN: 4.0000 mg | Freq: Four times a day (QID) | INTRAMUSCULAR | Status: DC | PRN

## 2022-08-09 MED ORDER — ENOXAPARIN SODIUM 40 MG/0.4ML IJ SOSY
40.0000 mg | PREFILLED_SYRINGE | INTRAMUSCULAR | Status: DC
  Administered 2022-08-10: 40 mg via SUBCUTANEOUS
  Filled 2022-08-09: qty 0.4

## 2022-08-09 MED ORDER — ACETAMINOPHEN 650 MG RE SUPP: 650.0000 mg | Freq: Four times a day (QID) | RECTAL | Status: DC | PRN

## 2022-08-09 MED ORDER — SODIUM CHLORIDE 0.9 % IV BOLUS
1000.0000 mL | Freq: Once | INTRAVENOUS | Status: AC
  Administered 2022-08-09: 1000 mL via INTRAVENOUS

## 2022-08-09 MED ORDER — ONDANSETRON HCL 4 MG PO TABS: 4.0000 mg | ORAL_TABLET | Freq: Four times a day (QID) | ORAL | Status: DC | PRN

## 2022-08-09 MED ORDER — SODIUM CHLORIDE 0.9 % IV SOLN: 1.0000 g | INTRAVENOUS | Status: DC

## 2022-08-09 NOTE — H&P (Signed)
History and Physical    Patient: Jessica [name] ZOX:[id number] DOB: August 19, 1940 DOA: 08/09/2022 DOS: the patient was seen and examined on 08/10/2022 PCP: Inez Catalina, MD  Patient coming from: Home  Chief Complaint:  Chief Complaint  Patient presents with   Altered Mental Status   HPI: Jessica [name] is a 82 y.o. female with medical history significant of hypertension, PAD, HIV, anxiety, tobacco abuse who presents to the emergency department via EMS due to increased confusion and altered mental status.  At bedside, patient was lethargic, though easily arousable and answers a few questions, but quickly drifts back to sleep.  Patient states that she has been very urinating more frequently than normal in the last few days, but denies burning sensation on urination, fever or chills.  Rest of the history was obtained from home ED PA and ED medical record. Patient presented to the emergency department on 5/23 and she was treated for UTI and prescribed cefpodoxime which patient states that she has not been very compliant with this medication, though she has been compliant with other medications including home Percocet and Xanax. Per report, patient was seen by daughter about 2 days ago and she was normal at baseline (ambulatory and communicative), on seeing her today, she was confused and lethargic.  Patient lives with her other children and her sister who helps to take care of her.  Patient was reported to have hallucinations about a dog this morning (unusual for her).  Daughter checked her medications and noticed that she has missed multiple days of her antibiotic.  There was no report of fever, nausea, vomiting, pain.  ED Course:  In the emergency department, BP was 159/81, other vital signs are within normal range.  Workup in the ED showed normal CBC and BMP except for potassium of 2.8 and bicarb of 19.  Urinalysis was normal, urine drug screen was positive for benzodiazepine, lactic  acid was normal, magnesium 1.8.  Blood culture pending. Chest x-ray showed no acute abnormality noted CT head without contrast showed chronic white matter ischemic changes without acute abnormality. Patient was treated with IV ceftriaxone, IV hydration of 1 L NS was provided.  Hospitalist was asked admit patient for further evaluation and management.  Review of Systems: Review of systems as noted in the HPI. All other systems reviewed and are negative.   Past Medical History:  Diagnosis Date   Acute blood loss anemia 08/08/2020   Acute renal failure (ARF) (HCC) 08/17/2017   Anxiety 04/05/2012   Bilateral sensorineural hearing loss 06/11/2014   Mild to moderate on the left side and slight to mild on the right side per audiometry 05/2014.  Hearing aides with possible masking of tinnitus recommended but patient wished to defer secondary to finances.   Blood transfusion without reported diagnosis    pt denies   Bursitis of right shoulder 07/12/2012   s/p shoulder injection 07/12/2012    Cataract of right eye    REMOVED RIGHT EYE 4-19    Constipation due to pain medication 04/27/2010   Diverticulosis 02/08/2012   Extensive left-sided diverticula on colonoscopy March 2012 per Dr. Jena Gauss    Essential hypertension 07/20/2006   Genital herpes 07/20/2006   Glaucoma of left eye 07/20/2006   Headache 10/20/2019   Heart murmur 1961   Human immunodeficiency virus disease (HCC) 03/27/1986   Hyperlipidemia LDL goal < 100 04/05/2012   Hypokalemia 04/12/2018   Insomnia 10/20/2019   Left hip pain 04/11/2021   Leg pain  04/11/2021   Long-term current use of opiate analgesic 03/17/2016   Loose stools 04/08/2019   Lumbar degenerative disc disease 07/20/2006   With chronic back pain    Marijuana use 07/03/2016   Micturition syncope 09/20/2015   Nausea and vomiting 04/08/2019   Opiate dependence (HCC) 04/12/2018   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   s/p aortobifem bypass 2009    Periumbilical hernia 05/18/2014    1 cm left periumbilical abdominal wall defect   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   DEXA 04/15/2012: L1-L4 spine T -3.9, Right femur T -3.0    Right rotator cuff tear 02/01/2013   Responds to periodic steroid injections   Seborrhea 09/01/2010   Shoulder pain, right 12/04/2017   Small bowel obstruction due to adhesions (HCC) 02/08/2012   s/p Exploratory laparotomy, lysis of adhesions 02/12/12     Subjective tinnitus of both ears 05/18/2014   Tobacco abuse 02/19/2012   Tobacco abuse    Vasovagal syncope 02/15/2015   Vitamin D deficiency 05/29/2018   Vitamin D 18.96 (04/30/2018), treated with ergocalciferol 50,000 units PO QWk X 4 weeks   Voiding dysfunction    s/p cystoscopy and meatal dilation Dec 2005   Past Surgical History:  Procedure Laterality Date   ABDOMINAL HYSTERECTOMY     AORTO-FEMORAL BYPASS GRAFT  04/2007   APPENDECTOMY     BREAST SURGERY     Breast biopsy: negative   CHOLECYSTECTOMY     COLECTOMY  01/2011   Dr. Jamey Ripa; "took out 12 inches of small intestiines and removed blockage"   COLONOSCOPY  2012   EYE SURGERY     EYE SURGERY  06/29/2020   06-29-2020- RIGHT CATARACT REMOVED AND LEFT EYE SURGERY TO REMOVE SAND LIKE SUBSTANCE    FEMORAL ARTERY EXPLORATION Left 07/10/2020   Procedure: REDO LEFT FEMORAL ARTERY EXPOSURE;  Surgeon: Nada Libman, MD;  Location: [facility] OR;  Service: Vascular;  Laterality: Left;   LAPAROTOMY  02/12/2012   Procedure: EXPLORATORY LAPAROTOMY;  Surgeon: Almond Lint, MD;  Location: [facility] OR;  Service: General;  Laterality: N/A;  Exploratory Laparotomy, lysis of adhesions   SMALL INTESTINE SURGERY     THROMBECTOMY FEMORAL ARTERY Left 07/10/2020   Procedure: THROMBECTOMY AORTA-BIFEMORAL GRAFT, PROFUNDA, AND SUPERFICIAL FEMORAL ARTERY  LEFT LEG;  Surgeon: Nada Libman, MD;  Location: [facility] OR;  Service: Vascular;  Laterality: Left;    Social History:  reports that she has been smoking cigarettes. She has a 15.00 pack-year smoking history. She has been  exposed to tobacco smoke. She has never used smokeless tobacco. She reports that she does not drink alcohol and does not use drugs.   Allergies  Allergen Reactions   Hctz [Hydrochlorothiazide] Other (See Comments)    Dizziness, syncope; does NOT wish to take anymore    Family History  Problem Relation Age of Onset   Kidney failure Mother    Diabetes Mother    Hypertension Mother    Heart disease Mother    Glaucoma Father    Congestive Heart Failure Sister    Diabetes Sister    Kidney disease Sister    Diabetes Brother    Unexplained death Brother 76       Automobile accident   Hypothyroidism Daughter    Arthritis Daughter        Neck/Back   Healthy Son    HIV/AIDS Brother    HIV Daughter    Kidney disease Daughter    Arthritis Son        Knee  Colon cancer Neg Hx    Colon polyps Neg Hx    Esophageal cancer Neg Hx    Rectal cancer Neg Hx    Stomach cancer Neg Hx      Prior to Admission medications   Medication Sig Start Date End Date Taking? Authorizing Provider  acetaminophen (TYLENOL) 325 MG tablet Take 2 tablets (650 mg total) by mouth every 6 (six) hours as needed for mild pain or fever (or Fever >/= 101). 07/13/22  Yes Emokpae, Courage, MD  acyclovir (ZOVIRAX) 400 MG tablet TAKE 1 TABLET BY MOUTH THREE TIMES DAILY AS NEEDED FOR OUTBREAKS FOR SEVEN DAYS Patient taking differently: Take 400 mg by mouth 3 (three) times daily as needed. For severe outbreaks for 7 days 04/12/22  Yes Inez Catalina, MD  alendronate (FOSAMAX) 70 MG tablet Take 1 tablet (70 mg total) by mouth every Sunday. TAKE 1 TABLET BY MOUTH ONCE WEEKLY ON SUNDAY take with a full glass of water on an empty stomach Strength: 70 mg 07/16/22  Yes Emokpae, Courage, MD  ALPRAZolam (XANAX) 0.5 MG tablet TAKE 2 TO 3 TABLETS BY MOUTH AT BEDTIME AS NEEDED FOR anxiety/sleep Patient taking differently: Take 0.5 mg by mouth See admin instructions. TAKE 2 TO 3 TABLETS BY MOUTH AT BEDTIME AS NEEDED FOR anxiety/sleep  07/13/22  Yes Inez Catalina, MD  amLODipine (NORVASC) 10 MG tablet Take 1 tablet (10 mg total) by mouth daily. 07/13/22  Yes Emokpae, Courage, MD  ammonium lactate (AMLACTIN) 12 % cream Apply topically. 05/15/22  Yes [provider]  aspirin EC 81 MG tablet Take 1 tablet (81 mg total) by mouth daily with breakfast. 07/13/22  Yes Emokpae, Courage, MD  benazepril (LOTENSIN) 40 MG tablet Take 1 tablet (40 mg total) by mouth daily. 07/13/22  Yes Emokpae, Courage, MD  bictegravir-emtricitabine-tenofovir AF (BIKTARVY) 50-200-25 MG TABS tablet Take 1 tablet by mouth daily. 06/15/22  Yes Daiva Eves, Lisette Grinder, MD  cefpodoxime (VANTIN) 200 MG tablet Take 1 tablet (200 mg total) by mouth 2 (two) times daily for 7 days. 08/03/22 08/10/22 Yes Beatty, Celeste A, PA-C  clopidogrel (PLAVIX) 75 MG tablet Take 1 tablet (75 mg total) by mouth daily. 07/13/22  Yes Emokpae, Courage, MD  dextromethorphan-guaiFENesin (MUCINEX DM) 30-600 MG 12hr tablet Take 1-2 tablets by mouth 2 (two) times daily as needed for cough.   Yes [provider]  diclofenac Sodium (VOLTAREN) 1 % GEL APPLY TWO GRAMS TO AFFECTED AREA(S) FOUR TIMES DAILY Patient taking differently: Apply 2 g topically 4 (four) times daily. 06/06/21  Yes Inez Catalina, MD  dorzolamide-timolol (COSOPT) 2-0.5 % ophthalmic solution Place 1 drop into the right eye 2 (two) times daily. 07/18/22  Yes [provider]  dorzolamide-timolol (COSOPT) 22.3-6.8 MG/ML ophthalmic solution Place 1 drop into both eyes 2 (two) times daily. 03/09/21  Yes [provider]  gabapentin (NEURONTIN) 300 MG capsule Take 2 capsules (600 mg total) by mouth 3 (three) times daily. 07/13/22 07/13/23 Yes Emokpae, Courage, MD  melatonin 5 MG TABS Take 5 mg by mouth at bedtime.   Yes [provider]  Multiple Vitamin (MULTIVITAMIN WITH MINERALS) TABS Take 1 tablet by mouth daily.   Yes [provider]  oxyCODONE-acetaminophen (PERCOCET/ROXICET) 5-325 MG tablet TAKE  1 TABLET BY MOUTH EVERY TWELVE HOURS AS NEEDED FOR PAIN Patient taking differently: Take 1 tablet by mouth See admin instructions. TAKE 1 TABLET BY MOUTH EVERY TWELVE HOURS AS NEEDED FOR PAIN 07/17/22  Yes Inez Catalina, MD  phenylephrine (NEO-SYNEPHRINE) 1 % nasal spray Place 1 drop into both nostrils every 6 (six) hours as needed for congestion.   Yes [provider]  psyllium (REGULOID) 0.52 g capsule Take 0.52 g by mouth daily.   Yes [provider]  terazosin (HYTRIN) 5 MG capsule TAKE ONE CAPSULE BY MOUTH AT BEDTIME 08/01/22  Yes Inez Catalina, MD  triamcinolone ointment (KENALOG) 0.1 % APPLY TO THE AFFECTED AREA(S) TWICE DAILY Patient taking differently: Apply 1 application  topically 2 (two) times daily as needed (rash). 07/05/20  Yes Inez Catalina, MD  atorvastatin (LIPITOR) 10 MG tablet Take 1 tablet (10 mg total) by mouth at bedtime. 07/13/22   Shon Hale, MD  moxifloxacin (VIGAMOX) 0.5 % ophthalmic solution Place into the right eye. Patient not taking: Reported on 08/09/2022 07/14/22   [provider]  polyethylene glycol (MIRALAX / GLYCOLAX) 17 g packet Take 17 g by mouth See admin instructions. Take 1 dose twice daily for 1 week and then 1 dose daily after that indefinitely 07/13/22   Shon Hale, MD    Physical Exam: BP (!) 170/86 (BP Location: Left Arm)   Pulse 71   Temp 98 F (36.7 C) (Oral)   Resp 16   Ht 5\' 10"  (1.778 m)   Wt 54.2 kg   SpO2 100%   BMI 17.14 kg/m   General: 82 y.o. year-old female ill appearing, cachectic, somnolent, though easily arousable, but in no acute distress.   HEENT: NCAT, PERRL, dry mucous membrane Neck: Supple, trachea medial Cardiovascular: Regular rate and rhythm with no rubs or gallops.  No thyromegaly or JVD noted.  No lower extremity edema. 2/4 pulses in all 4 extremities. Respiratory: Clear to auscultation with no wheezes or rales. Good inspiratory effort. Abdomen: Soft, tender to palpation of  suprapubic area without guarding.  Nondistended with normal bowel sounds x4 quadrants. Muskuloskeletal: No cyanosis, clubbing or edema noted bilaterally Neuro: No focal neurologic deficits, moving all extremities.   Skin: No ulcerative lesions noted or rashes Psychiatry: This cannot be assessed at this time due to patient's current condition         Labs on Admission:  Basic Metabolic Panel: Recent Labs  Lab 08/03/22 1301 08/09/22 1246  NA 137 139  K 3.1* 2.8*  CL 106 108  CO2 21* 19*  GLUCOSE 97 92  BUN 14 8  CREATININE 1.19* 0.51  CALCIUM 9.1 8.7*  MG  --  1.8   Liver Function Tests: Recent Labs  Lab 08/03/22 1301 08/09/22 1246  AST 17 19  ALT 12 11  ALKPHOS 59 63  BILITOT 0.7 0.4  PROT 7.5 7.1  ALBUMIN 4.0 3.5   No results for input(s): "LIPASE", "AMYLASE" in the last 168 hours. No results for input(s): "AMMONIA" in the last 168 hours. CBC: Recent Labs  Lab 08/03/22 1301 08/09/22 1246  WBC 6.6 5.4  NEUTROABS 2.5 2.8  HGB 12.5 13.1  HCT 37.8 39.2  MCV 99.0 97.0  PLT 283 278   Cardiac Enzymes: No results for input(s): "CKTOTAL", "CKMB", "CKMBINDEX", "TROPONINI" in the last 168 hours.  BNP (last 3 results) No results for input(s): "BNP" in the last 8760 hours.  ProBNP (last 3 results) No results for input(s): "PROBNP" in the last 8760 hours.  CBG: No results for input(s): "GLUCAP" in the last 168 hours.  Radiological Exams on Admission: DG Chest Portable 1 View  Result Date: 08/09/2022 CLINICAL DATA:  Altered mental status EXAM: PORTABLE CHEST 1 VIEW COMPARISON:  08/15/2019, CT from 06/09/2022 FINDINGS: Cardiac shadow is stable. Aortic calcifications are again seen. Lungs are well aerated bilaterally. No focal infiltrate or effusion is seen. Chronic interstitial prominence is noted stable from the prior study. Known nodule in the right upper lobe is not well appreciated on this exam. No bony abnormality is seen. IMPRESSION: No acute abnormality noted.  Electronically Signed   By: Alcide Clever M.D.   On: 08/09/2022 15:59   CT Head Wo Contrast  Result Date: 08/09/2022 CLINICAL DATA:  Altered mental status EXAM: CT HEAD WITHOUT CONTRAST TECHNIQUE: Contiguous axial images were obtained from the base of the skull through the vertex without intravenous contrast. RADIATION DOSE REDUCTION: This exam was performed according to the departmental dose-optimization program which includes automated exposure control, adjustment of the mA and/or kV according to patient size and/or use of iterative reconstruction technique. COMPARISON:  08/03/2022 FINDINGS: Brain: No evidence of acute infarction, hemorrhage, hydrocephalus, extra-axial collection or mass lesion/mass effect. Mild chronic white matter ischemic changes are seen. Vascular: No hyperdense vessel or unexpected calcification. Skull: Normal. Negative for fracture or focal lesion. Sinuses/Orbits: No acute finding. Other: None. IMPRESSION: Chronic white matter ischemic change without acute abnormality. Electronically Signed   By: Alcide Clever M.D.   On: 08/09/2022 15:48    EKG: I independently viewed the EKG done and my findings are as followed: EKG was not done in the ED  Assessment/Plan Present on Admission:  Acute metabolic encephalopathy  UTI (urinary tract infection)  Hypokalemia  Dehydration  HIV (human immunodeficiency virus infection) (HCC)  Essential hypertension  Mixed hyperlipidemia  PAD (peripheral artery disease) (HCC)  Principal Problem:   Acute metabolic encephalopathy Active Problems:   HIV (human immunodeficiency virus infection) (HCC)   Essential hypertension   PAD (peripheral artery disease) (HCC)   Mixed hyperlipidemia   UTI (urinary tract infection)   Dehydration   Hypokalemia   Noncompliance with medication regimen  Acute metabolic encephalopathy possibly due to multifactorial including polypharmacy Patient's acting CNS drugs including Xanax, Percocet, gabapentin will be  temporarily held at this time Continue fall precaution, aspiration precaution, seizure precaution Patient will be temporarily placed n.p.o. pending being more alert  UTI Patient was recently started on Vantin, but she was not compliant, so, she missed several doses. She was empirically treated with IV ceftriaxone, we shall continue same at this time Urine culture pending  Noncompliance with medication regimen Patient will be consulted on importance of being compliant with medication regimen when more alert  Dehydration Continue IV hydration  Hypokalemia K+ 2.8, this was replenished Continue to monitor K+ level with morning labs  HIV Controlled.  Continue Biktarvy Patient follows with Dr. Algis Liming Recent HIV viral load 06/15/22- not detected. CD4 count 1139.   Essential hypertension Continue benazepril, terazosin and Norvasc  Mixed hyperlipidemia Continue Lipitor  PAD Continue Plavix, statin, aspirin   DVT prophylaxis: Lovenox   Advance Care Planning: Full code  Consults: None  Family Communication: None at bedside  Severity of Illness: The appropriate patient status for this patient is INPATIENT. Inpatient status is judged to be reasonable and neceFull codessary in order to provide the required intensity of service to ensure the patient's safety. The patient's presenting symptoms, physical exam findings, and initial radiographic and laboratory data in the context of their chronic comorbidities is felt to place them at high risk for further clinical deterioration. Furthermore, it is not anticipated that the patient will be medically stable for discharge from the hospital within 2 midnights of admission.   *  I certify that at the point of admission it is my clinical judgment that the patient will require inpatient hospital care spanning beyond 2 midnights from the point of admission due to high intensity of service, high risk for further deterioration and high frequency of  surveillance required.*  Author: Frankey Shown, DO 08/10/2022 12:03 AM  For on call review [email or web address].

## 2022-08-09 NOTE — ED Notes (Signed)
Pt is resting at this time. Pt does not appear to be in any distress. Pt vitals WNL.

## 2022-08-09 NOTE — ED Provider Notes (Signed)
[place] EMERGENCY DEPARTMENT AT [facility] Provider Note   CSN: [id number] Arrival date & time: 08/09/22  1142     History  Chief Complaint  Patient presents with   Altered Mental Status    Jessica [name] is a 82 y.o. female with a history including HIV (last CD4 count April 4 was around 1100), hyperlipidemia, heart murmur, hypertension who is seen here 6 days ago where she was treated with a UTI, prescribed Vantin which daughter states she has not been compliant with.  She states her mother was her normal self when she last saw her 2 days ago, when she went to visit today noted her to be increasingly confused and lethargic.  The patient lives with other children and her sister who helps to care for her.  To the daughter's knowledge she has had no nausea or vomiting, fevers or complaints of pain.  She states when she visited her this morning she was having hallucinations about a dog.  She checked her medications and discovered she has missed multiple days of her antibiotic.  The history is provided by a relative (Daughter [name] at bedside). The history is limited by the condition of the patient. Language interpreter used: pt lethargic, drifts to sleep if not stimulated.  Unable to give hx.       Home Medications Prior to Admission medications   Medication Sig Start Date End Date Taking? Authorizing Provider  acetaminophen (TYLENOL) 325 MG tablet Take 2 tablets (650 mg total) by mouth every 6 (six) hours as needed for mild pain or fever (or Fever >/= 101). 07/13/22  Yes Emokpae, Courage, MD  acyclovir (ZOVIRAX) 400 MG tablet TAKE 1 TABLET BY MOUTH THREE TIMES DAILY AS NEEDED FOR OUTBREAKS FOR SEVEN DAYS Patient taking differently: Take 400 mg by mouth 3 (three) times daily as needed. For severe outbreaks for 7 days 04/12/22  Yes Inez Catalina, MD  alendronate (FOSAMAX) 70 MG tablet Take 1 tablet (70 mg total) by mouth every Sunday. TAKE 1 TABLET BY MOUTH ONCE WEEKLY  ON SUNDAY take with a full glass of water on an empty stomach Strength: 70 mg 07/16/22  Yes Emokpae, Courage, MD  ALPRAZolam (XANAX) 0.5 MG tablet TAKE 2 TO 3 TABLETS BY MOUTH AT BEDTIME AS NEEDED FOR anxiety/sleep Patient taking differently: Take 0.5 mg by mouth See admin instructions. TAKE 2 TO 3 TABLETS BY MOUTH AT BEDTIME AS NEEDED FOR anxiety/sleep 07/13/22  Yes Inez Catalina, MD  amLODipine (NORVASC) 10 MG tablet Take 1 tablet (10 mg total) by mouth daily. 07/13/22  Yes Emokpae, Courage, MD  ammonium lactate (AMLACTIN) 12 % cream Apply topically. 05/15/22  Yes [provider]  aspirin EC 81 MG tablet Take 1 tablet (81 mg total) by mouth daily with breakfast. 07/13/22  Yes Emokpae, Courage, MD  benazepril (LOTENSIN) 40 MG tablet Take 1 tablet (40 mg total) by mouth daily. 07/13/22  Yes Emokpae, Courage, MD  bictegravir-emtricitabine-tenofovir AF (BIKTARVY) 50-200-25 MG TABS tablet Take 1 tablet by mouth daily. 06/15/22  Yes Daiva Eves, Lisette Grinder, MD  cefpodoxime (VANTIN) 200 MG tablet Take 1 tablet (200 mg total) by mouth 2 (two) times daily for 7 days. 08/03/22 08/10/22 Yes Beatty, Celeste A, PA-C  clopidogrel (PLAVIX) 75 MG tablet Take 1 tablet (75 mg total) by mouth daily. 07/13/22  Yes Emokpae, Courage, MD  dextromethorphan-guaiFENesin (MUCINEX DM) 30-600 MG 12hr tablet Take 1-2 tablets by mouth 2 (two) times daily as needed for cough.  Yes [provider]  diclofenac Sodium (VOLTAREN) 1 % GEL APPLY TWO GRAMS TO AFFECTED AREA(S) FOUR TIMES DAILY Patient taking differently: Apply 2 g topically 4 (four) times daily. 06/06/21  Yes Inez Catalina, MD  dorzolamide-timolol (COSOPT) 2-0.5 % ophthalmic solution Place 1 drop into the right eye 2 (two) times daily. 07/18/22  Yes [provider]  dorzolamide-timolol (COSOPT) 22.3-6.8 MG/ML ophthalmic solution Place 1 drop into both eyes 2 (two) times daily. 03/09/21  Yes [provider]  gabapentin (NEURONTIN) 300 MG capsule Take 2  capsules (600 mg total) by mouth 3 (three) times daily. 07/13/22 07/13/23 Yes Emokpae, Courage, MD  melatonin 5 MG TABS Take 5 mg by mouth at bedtime.   Yes [provider]  Multiple Vitamin (MULTIVITAMIN WITH MINERALS) TABS Take 1 tablet by mouth daily.   Yes [provider]  oxyCODONE-acetaminophen (PERCOCET/ROXICET) 5-325 MG tablet TAKE 1 TABLET BY MOUTH EVERY TWELVE HOURS AS NEEDED FOR PAIN Patient taking differently: Take 1 tablet by mouth See admin instructions. TAKE 1 TABLET BY MOUTH EVERY TWELVE HOURS AS NEEDED FOR PAIN 07/17/22  Yes Inez Catalina, MD  phenylephrine (NEO-SYNEPHRINE) 1 % nasal spray Place 1 drop into both nostrils every 6 (six) hours as needed for congestion.   Yes [provider]  psyllium (REGULOID) 0.52 g capsule Take 0.52 g by mouth daily.   Yes [provider]  terazosin (HYTRIN) 5 MG capsule TAKE ONE CAPSULE BY MOUTH AT BEDTIME 08/01/22  Yes Inez Catalina, MD  triamcinolone ointment (KENALOG) 0.1 % APPLY TO THE AFFECTED AREA(S) TWICE DAILY Patient taking differently: Apply 1 application  topically 2 (two) times daily as needed (rash). 07/05/20  Yes Inez Catalina, MD  atorvastatin (LIPITOR) 10 MG tablet Take 1 tablet (10 mg total) by mouth at bedtime. 07/13/22   Shon Hale, MD  moxifloxacin (VIGAMOX) 0.5 % ophthalmic solution Place into the right eye. Patient not taking: Reported on 08/09/2022 07/14/22   [provider]  polyethylene glycol (MIRALAX / GLYCOLAX) 17 g packet Take 17 g by mouth See admin instructions. Take 1 dose twice daily for 1 week and then 1 dose daily after that indefinitely 07/13/22   Shon Hale, MD      Allergies    Hctz [hydrochlorothiazide]    Review of Systems   Review of Systems  Unable to perform ROS: Mental status change    Physical Exam Updated Vital Signs BP (!) 140/67   Pulse (!) 57   Temp 97.6 F (36.4 C) (Oral)   Resp 12   Ht 5' 8.75" (1.746 m)   Wt 54.4 kg   SpO2 93%   BMI  17.85 kg/m  Physical Exam Vitals and nursing note reviewed.  Constitutional:      Appearance: She is underweight. She is not diaphoretic.     Comments: Drowsy will open her eyes and respond briefly to tactile stimulation, quickly drifts off to sleep.  HENT:     Head: Normocephalic and atraumatic.  Eyes:     Conjunctiva/sclera: Conjunctivae normal.  Cardiovascular:     Rate and Rhythm: Normal rate and regular rhythm.     Heart sounds: Normal heart sounds.  Pulmonary:     Effort: Pulmonary effort is normal.     Breath sounds: Normal breath sounds. No wheezing.  Abdominal:     General: Bowel sounds are normal.     Palpations: Abdomen is soft.     Tenderness: There is no abdominal tenderness.  Musculoskeletal:  General: Normal range of motion.     Cervical back: Normal range of motion.  Skin:    General: Skin is warm and dry.     ED Results / Procedures / Treatments   Labs (all labs ordered are listed, but only abnormal results are displayed) Labs Reviewed  COMPREHENSIVE METABOLIC PANEL - Abnormal; Notable for the following components:      Result Value   Potassium 2.8 (*)    CO2 19 (*)    Calcium 8.7 (*)    All other components within normal limits  URINALYSIS, ROUTINE W REFLEX MICROSCOPIC - Abnormal; Notable for the following components:   Hgb urine dipstick SMALL (*)    Ketones, ur 5 (*)    All other components within normal limits  RAPID URINE DRUG SCREEN, HOSP PERFORMED - Abnormal; Notable for the following components:   Benzodiazepines POSITIVE (*)    All other components within normal limits  CULTURE, BLOOD (ROUTINE X 2)  CULTURE, BLOOD (ROUTINE X 2)  CBC WITH DIFFERENTIAL/PLATELET  LACTIC ACID, PLASMA  MAGNESIUM    EKG None  Radiology DG Chest Portable 1 View  Result Date: 08/09/2022 CLINICAL DATA:  Altered mental status EXAM: PORTABLE CHEST 1 VIEW COMPARISON:  08/15/2019, CT from 06/09/2022 FINDINGS: Cardiac shadow is stable. Aortic calcifications  are again seen. Lungs are well aerated bilaterally. No focal infiltrate or effusion is seen. Chronic interstitial prominence is noted stable from the prior study. Known nodule in the right upper lobe is not well appreciated on this exam. No bony abnormality is seen. IMPRESSION: No acute abnormality noted. Electronically Signed   By: Alcide Clever M.D.   On: 08/09/2022 15:59   CT Head Wo Contrast  Result Date: 08/09/2022 CLINICAL DATA:  Altered mental status EXAM: CT HEAD WITHOUT CONTRAST TECHNIQUE: Contiguous axial images were obtained from the base of the skull through the vertex without intravenous contrast. RADIATION DOSE REDUCTION: This exam was performed according to the departmental dose-optimization program which includes automated exposure control, adjustment of the mA and/or kV according to patient size and/or use of iterative reconstruction technique. COMPARISON:  08/03/2022 FINDINGS: Brain: No evidence of acute infarction, hemorrhage, hydrocephalus, extra-axial collection or mass lesion/mass effect. Mild chronic white matter ischemic changes are seen. Vascular: No hyperdense vessel or unexpected calcification. Skull: Normal. Negative for fracture or focal lesion. Sinuses/Orbits: No acute finding. Other: None. IMPRESSION: Chronic white matter ischemic change without acute abnormality. Electronically Signed   By: Alcide Clever M.D.   On: 08/09/2022 15:48    Procedures Procedures    Medications Ordered in ED Medications  potassium chloride 10 mEq in 100 mL IVPB (has no administration in time range)  cefTRIAXone (ROCEPHIN) 1 g in sodium chloride 0.9 % 100 mL IVPB (has no administration in time range)  sodium chloride 0.9 % bolus 1,000 mL (1,000 mLs Intravenous New Bag/Given 08/09/22 1632)  potassium chloride 10 mEq in 100 mL IVPB (0 mEq Intravenous Stopped 08/09/22 1801)    ED Course/ Medical Decision Making/ A&P                             Medical Decision Making Patient presenting with  confusion, hallucinations per daughter's report earlier today, now simply somnolent and drowsy, with daughter giving majority of patient's history.  She has been noncompliant with the Vantin she was prescribed for UTI on 523.  She has taken some of her tablets but not all.  Her urinalysis today appears  normal.  Her lactate is normal as well, she is not febrile, I doubt this represents urosepsis.  She does have prescriptions for oxycodone and Xanax, although these are not new medications, have to question whether she took too many of her medication.  Her UDS is negative for narcotics but positive for benzos.  CT head is negative for acute intracranial bleed.  Differential diagnosis could be combination of metabolic encephalopathy, overmedication complicated by ongoing UTI.  Amount and/or Complexity of Data Reviewed Labs: ordered.    Details: Per above Radiology: ordered.    Details: Per above Discussion of management or test interpretation with external provider(s): Patient discussed with Dr. Thomes Dinning who accepts patient for admission.  Risk Decision regarding hospitalization.           Final Clinical Impression(s) / ED Diagnoses Final diagnoses:  Somnolence    Rx / DC Orders ED Discharge Orders     None         Victoriano Lain 08/09/22 2012    Bethann Berkshire, MD 08/13/22 1340

## 2022-08-09 NOTE — ED Triage Notes (Signed)
Pt to er via ems, per ems pt is here for increased confusion and altered mental status and hip pain.  Per ems pt's last know normal was Sunday, states that pt is normally oriented,

## 2022-08-10 ENCOUNTER — Other Ambulatory Visit: Payer: Self-pay | Admitting: *Deleted

## 2022-08-10 ENCOUNTER — Observation Stay (HOSPITAL_COMMUNITY)
Admit: 2022-08-10 | Discharge: 2022-08-10 | Disposition: A | Discharge status: Home / Self Care | Payer: Medicare Other | Attending: Family Medicine | Admitting: Family Medicine

## 2022-08-10 DIAGNOSIS — R569 Unspecified convulsions: Secondary | ICD-10-CM | POA: Diagnosis not present

## 2022-08-10 DIAGNOSIS — R4182 Altered mental status, unspecified: Secondary | ICD-10-CM

## 2022-08-10 DIAGNOSIS — G9341 Metabolic encephalopathy: Secondary | ICD-10-CM | POA: Diagnosis not present

## 2022-08-10 LAB — CBC
HCT: 45.5 % (ref 36.0–46.0)
Hemoglobin: 15.3 g/dL — ABNORMAL HIGH (ref 12.0–15.0)
MCH: 32.4 pg (ref 26.0–34.0)
MCHC: 33.6 g/dL (ref 30.0–36.0)
MCV: 96.4 fL (ref 80.0–100.0)
Platelets: 308 10*3/uL (ref 150–400)
RBC: 4.72 MIL/uL (ref 3.87–5.11)
RDW: 13.8 % (ref 11.5–15.5)
WBC: 5.7 10*3/uL (ref 4.0–10.5)
nRBC: 0 % (ref 0.0–0.2)

## 2022-08-10 LAB — MAGNESIUM: Magnesium: 1.9 mg/dL (ref 1.7–2.4)

## 2022-08-10 LAB — CULTURE, BLOOD (ROUTINE X 2): Culture: NO GROWTH

## 2022-08-10 LAB — COMPREHENSIVE METABOLIC PANEL
ALT: 13 U/L (ref 0–44)
AST: 25 U/L (ref 15–41)
Albumin: 4.1 g/dL (ref 3.5–5.0)
Alkaline Phosphatase: 74 U/L (ref 38–126)
Anion gap: 12 (ref 5–15)
BUN: 7 mg/dL — ABNORMAL LOW (ref 8–23)
CO2: 19 mmol/L — ABNORMAL LOW (ref 22–32)
Calcium: 9.1 mg/dL (ref 8.9–10.3)
Chloride: 106 mmol/L (ref 98–111)
Creatinine, Ser: 0.53 mg/dL (ref 0.44–1.00)
GFR, Estimated: >60 mL/min (ref >60)
Glucose, Bld: 80 mg/dL (ref 70–99)
Potassium: 3.5 mmol/L (ref 3.5–5.1)
Sodium: 137 mmol/L (ref 135–145)
Total Bilirubin: 0.7 mg/dL (ref 0.3–1.2)
Total Protein: 8.5 g/dL — ABNORMAL HIGH (ref 6.5–8.1)

## 2022-08-10 LAB — PHOSPHORUS: Phosphorus: 2.3 mg/dL — ABNORMAL LOW (ref 2.5–4.6)

## 2022-08-10 MED ORDER — BICTEGRAVIR-EMTRICITAB-TENOFOV 50-200-25 MG PO TABS
1.0000 | ORAL_TABLET | Freq: Every day | ORAL | Status: DC
  Administered 2022-08-10: 1 via ORAL
  Filled 2022-08-10 (×2): qty 1

## 2022-08-10 MED ORDER — ORAL CARE MOUTH RINSE: 15.0000 mL | OROMUCOSAL | Status: DC | PRN

## 2022-08-10 MED ORDER — ASPIRIN 81 MG PO TBEC
81.0000 mg | DELAYED_RELEASE_TABLET | Freq: Every day | ORAL | Status: DC
  Administered 2022-08-10: 81 mg via ORAL
  Filled 2022-08-10: qty 1

## 2022-08-10 MED ORDER — CLOPIDOGREL BISULFATE 75 MG PO TABS: 75.0000 mg | ORAL_TABLET | Freq: Every day | ORAL | 3 refills | Status: DC

## 2022-08-10 MED ORDER — ORAL CARE MOUTH RINSE
15.0000 mL | OROMUCOSAL | Status: DC
  Administered 2022-08-10 (×2): 15 mL via OROMUCOSAL

## 2022-08-10 MED ORDER — BENAZEPRIL HCL 20 MG PO TABS
40.0000 mg | ORAL_TABLET | Freq: Every day | ORAL | Status: DC
  Administered 2022-08-10: 40 mg via ORAL
  Filled 2022-08-10: qty 2

## 2022-08-10 MED ORDER — SODIUM CHLORIDE 0.9 % IV SOLN: INTRAVENOUS | Status: DC

## 2022-08-10 MED ORDER — MELATONIN 5 MG PO TABS: 5.0000 mg | ORAL_TABLET | Freq: Every evening | ORAL | 0 refills | Status: DC | PRN

## 2022-08-10 MED ORDER — CLOPIDOGREL BISULFATE 75 MG PO TABS
75.0000 mg | ORAL_TABLET | Freq: Every day | ORAL | Status: DC
  Administered 2022-08-10: 75 mg via ORAL
  Filled 2022-08-10: qty 1

## 2022-08-10 MED ORDER — ALPRAZOLAM 0.5 MG PO TABS: 0.5000 mg | ORAL_TABLET | Freq: Every evening | ORAL | 0 refills | Status: DC | PRN

## 2022-08-10 MED ORDER — K PHOS MONO-SOD PHOS DI & MONO 155-852-130 MG PO TABS
250.0000 mg | ORAL_TABLET | Freq: Three times a day (TID) | ORAL | Status: DC
  Administered 2022-08-10: 250 mg via ORAL
  Filled 2022-08-10: qty 1

## 2022-08-10 MED ORDER — POTASSIUM CHLORIDE CRYS ER 20 MEQ PO TBCR
40.0000 meq | EXTENDED_RELEASE_TABLET | Freq: Once | ORAL | Status: AC
  Administered 2022-08-10: 40 meq via ORAL
  Filled 2022-08-10: qty 2

## 2022-08-10 MED ORDER — AMLODIPINE BESYLATE 5 MG PO TABS
10.0000 mg | ORAL_TABLET | Freq: Every day | ORAL | Status: DC
  Administered 2022-08-10: 10 mg via ORAL
  Filled 2022-08-10: qty 2

## 2022-08-10 MED ORDER — ATORVASTATIN CALCIUM 10 MG PO TABS
10.0000 mg | ORAL_TABLET | Freq: Every day | ORAL | Status: DC
  Administered 2022-08-10: 10 mg via ORAL
  Filled 2022-08-10: qty 1

## 2022-08-10 MED ORDER — ALPRAZOLAM 0.5 MG PO TABS
0.5000 mg | ORAL_TABLET | Freq: Two times a day (BID) | ORAL | Status: DC | PRN
  Administered 2022-08-10: 0.5 mg via ORAL
  Filled 2022-08-10: qty 1

## 2022-08-10 MED ORDER — TERAZOSIN HCL 5 MG PO CAPS
5.0000 mg | ORAL_CAPSULE | Freq: Every day | ORAL | Status: DC
  Administered 2022-08-10: 5 mg via ORAL
  Filled 2022-08-10: qty 1

## 2022-08-10 MED ORDER — POLYETHYLENE GLYCOL 3350 17 G PO PACK: 17.0000 g | PACK | ORAL | 2 refills | Status: DC

## 2022-08-10 NOTE — Discharge Instructions (Signed)
1)Please reduce Xanax/alprazolam to 1 to 2 tablets at bedtime only----please avoid taking excessive amount of strenuous  2) follow-up with PCP in about within a week  for recheck and reevaluation

## 2022-08-10 NOTE — Progress Notes (Signed)
EEG complete - results pending 

## 2022-08-10 NOTE — TOC CM/SW Note (Signed)
Transition of Care [facility]) - Inpatient Brief Assessment   Patient Details  Name: Jessica [name] MRN: [id number] Date of Birth: 11-Aug-1940  Transition of Care [facility]) CM/SW Contact:    Elliot Gault, LCSW Phone Number: 08/10/2022, 12:24 PM   Clinical Narrative:  Transition of Care Department [facility]) has reviewed patient and no TOC needs have been identified at this time. We will continue to monitor patient advancement through interdisciplinary progression rounds. If new patient transition needs arise, please place a TOC consult.  Transition of Care Asessment: Insurance and Status: Insurance coverage has been reviewed Patient has primary care physician: Yes Home environment has been reviewed: from home with family Prior level of function:: independent Prior/Current Home Services: No current home services Social Determinants of Health Reivew: SDOH reviewed no interventions necessary Readmission risk has been reviewed: Yes Transition of care needs: no transition of care needs at this time

## 2022-08-10 NOTE — Care Management CC44 (Signed)
Condition Code 44 Documentation Completed  Patient Details  Name: Jessica [name] MRN: [id number] Date of Birth: [birth year]   Condition Code 44 given:  Yes Patient signature on Condition Code 44 notice:  Yes Documentation of 2 MD's agreement:  Yes Code 44 added to claim:  Yes    Elliot Gault, LCSW 08/10/2022, 12:22 PM

## 2022-08-10 NOTE — Procedures (Signed)
Patient Name: Jessica [name]  MRN: [id number]  Epilepsy Attending: Charlsie Quest  Referring Physician/Provider: Shon Hale, MD  Date: 08/10/2022 Duration: 25.18 mins  Patient history:  82 y.o. female with medical history significant of hypertension, PAD, HIV, anxiety, tobacco abuse who presents to the emergency department via EMS due to increased confusion and altered mental status. EEG to evaluate for seizure.  Level of alertness: Awake  AEDs during EEG study: None  Technical aspects: This EEG study was done with scalp electrodes positioned according to the 10-20 International system of electrode placement. Electrical activity was reviewed with band pass filter of 1-70Hz , sensitivity of 7 uV/mm, display speed of 1mm/sec with a 60Hz  notched filter applied as appropriate. EEG data were recorded continuously and digitally stored.  Video monitoring was available and reviewed as appropriate.  Description: The posterior dominant rhythm consists of 9 Hz activity of moderate voltage (25-35 uV) seen predominantly in posterior head regions, symmetric and reactive to eye opening and eye closing. Hyperventilation and photic stimulation were not performed.     IMPRESSION: This study is within normal limits. No seizures or epileptiform discharges were seen throughout the recording.  A normal interictal EEG does not exclude  the diagnosis of epilepsy.  Jessica [name]

## 2022-08-10 NOTE — Telephone Encounter (Signed)
Patient's daughter called in to let PCP know that mom is being d/c from [facility] today. She is requesting her pain medication be refilled. She is aware that Rx is not due till 6/6. She will discuss all other med refills with discharging Provider.  Also, she was instructed by inpatient Provider to r/s tomorrow's PCP appt out 7-10 days. Transferred back to FO to r/s.

## 2022-08-10 NOTE — Discharge Summary (Signed)
Jessica [name], is a 82 y.o. female  DOB 22-Sep-1940  MRN [id number].  Admission date:  08/09/2022  Admitting Physician  Frankey Shown, DO  Discharge Date:  08/10/2022   Primary MD  Inez Catalina, MD  Recommendations for primary care physician for things to follow:   1)Please reduce Xanax/alprazolam to 1 to 2 tablets at bedtime only----please avoid taking excessive amount of strenuous  2) follow-up with PCP in about within a week  for recheck and reevaluation  Admission Diagnosis  Somnolence [R40.0] Acute metabolic encephalopathy [G93.41]   Discharge Diagnosis  Somnolence [R40.0] Acute metabolic encephalopathy [G93.41]    Principal Problem:   Acute metabolic encephalopathy Active Problems:   HIV (human immunodeficiency virus infection) (HCC)   Essential hypertension   PAD (peripheral artery disease) (HCC)   Mixed hyperlipidemia   UTI (urinary tract infection)   Dehydration   Hypokalemia   Noncompliance with medication regimen      Past Medical History:  Diagnosis Date   Acute blood loss anemia 08/08/2020   Acute renal failure (ARF) (HCC) 08/17/2017   Anxiety 04/05/2012   Bilateral sensorineural hearing loss 06/11/2014   Mild to moderate on the left side and slight to mild on the right side per audiometry 05/2014.  Hearing aides with possible masking of tinnitus recommended but patient wished to defer secondary to finances.   Blood transfusion without reported diagnosis    pt denies   Bursitis of right shoulder 07/12/2012   s/p shoulder injection 07/12/2012    Cataract of right eye    REMOVED RIGHT EYE 4-19    Constipation due to pain medication 04/27/2010   Diverticulosis 02/08/2012   Extensive left-sided diverticula on colonoscopy March 2012 per Dr. Jena Gauss    Essential hypertension 07/20/2006   Genital herpes 07/20/2006   Glaucoma of left eye 07/20/2006   Headache 10/20/2019   Heart  murmur 1961   Human immunodeficiency virus disease (HCC) 03/27/1986   Hyperlipidemia LDL goal < 100 04/05/2012   Hypokalemia 04/12/2018   Insomnia 10/20/2019   Left hip pain 04/11/2021   Leg pain 04/11/2021   Long-term current use of opiate analgesic 03/17/2016   Loose stools 04/08/2019   Lumbar degenerative disc disease 07/20/2006   With chronic back pain    Marijuana use 07/03/2016   Micturition syncope 09/20/2015   Nausea and vomiting 04/08/2019   Opiate dependence (HCC) 04/12/2018   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   s/p aortobifem bypass 2009    Periumbilical hernia 05/18/2014   1 cm left periumbilical abdominal wall defect   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   DEXA 04/15/2012: L1-L4 spine T -3.9, Right femur T -3.0    Right rotator cuff tear 02/01/2013   Responds to periodic steroid injections   Seborrhea 09/01/2010   Shoulder pain, right 12/04/2017   Small bowel obstruction due to adhesions (HCC) 02/08/2012   s/p Exploratory laparotomy, lysis of adhesions 02/12/12     Subjective tinnitus of both ears 05/18/2014   Tobacco abuse 02/19/2012   Tobacco  abuse    Vasovagal syncope 02/15/2015   Vitamin D deficiency 05/29/2018   Vitamin D 18.96 (04/30/2018), treated with ergocalciferol 50,000 units PO QWk X 4 weeks   Voiding dysfunction    s/p cystoscopy and meatal dilation Dec 2005    Past Surgical History:  Procedure Laterality Date   ABDOMINAL HYSTERECTOMY     AORTO-FEMORAL BYPASS GRAFT  04/2007   APPENDECTOMY     BREAST SURGERY     Breast biopsy: negative   CHOLECYSTECTOMY     COLECTOMY  01/2011   Dr. Jamey Ripa; "took out 12 inches of small intestiines and removed blockage"   COLONOSCOPY  2012   EYE SURGERY     EYE SURGERY  06/29/2020   06-29-2020- RIGHT CATARACT REMOVED AND LEFT EYE SURGERY TO REMOVE SAND LIKE SUBSTANCE    FEMORAL ARTERY EXPLORATION Left 07/10/2020   Procedure: REDO LEFT FEMORAL ARTERY EXPOSURE;  Surgeon: Nada Libman, MD;  Location: [facility] OR;  Service:  Vascular;  Laterality: Left;   LAPAROTOMY  02/12/2012   Procedure: EXPLORATORY LAPAROTOMY;  Surgeon: Almond Lint, MD;  Location: [facility] OR;  Service: General;  Laterality: N/A;  Exploratory Laparotomy, lysis of adhesions   SMALL INTESTINE SURGERY     THROMBECTOMY FEMORAL ARTERY Left 07/10/2020   Procedure: THROMBECTOMY AORTA-BIFEMORAL GRAFT, PROFUNDA, AND SUPERFICIAL FEMORAL ARTERY  LEFT LEG;  Surgeon: Nada Libman, MD;  Location: [facility] OR;  Service: Vascular;  Laterality: Left;     HPI  from the history and physical done on the day of admission:     HPI: Jessica [name] is a 82 y.o. female with medical history significant of hypertension, PAD, HIV, anxiety, tobacco abuse who presents to the emergency department via EMS due to increased confusion and altered mental status.  At bedside, patient was lethargic, though easily arousable and answers a few questions, but quickly drifts back to sleep.  Patient states that she has been very urinating more frequently than normal in the last few days, but denies burning sensation on urination, fever or chills.  Rest of the history was obtained from home ED PA and ED medical record. Patient presented to the emergency department on 5/23 and she was treated for UTI and prescribed cefpodoxime which patient states that she has not been very compliant with this medication, though she has been compliant with other medications including home Percocet and Xanax. Per report, patient was seen by daughter about 2 days ago and she was normal at baseline (ambulatory and communicative), on seeing her today, she was confused and lethargic.  Patient lives with her other children and her sister who helps to take care of her.  Patient was reported to have hallucinations about a dog this morning (unusual for her).  Daughter checked her medications and noticed that she has missed multiple days of her antibiotic.  There was no report of fever, nausea, vomiting, pain.   ED Course:   In the emergency department, BP was 159/81, other vital signs are within normal range.  Workup in the ED showed normal CBC and BMP except for potassium of 2.8 and bicarb of 19.  Urinalysis was normal, urine drug screen was positive for benzodiazepine, lactic acid was normal, magnesium 1.8.  Blood culture pending. Chest x-ray showed no acute abnormality noted CT head without contrast showed chronic white matter ischemic changes without acute abnormality. Patient was treated with IV ceftriaxone, IV hydration of 1 L NS was provided.  Hospitalist was asked admit patient for further evaluation and  management.   Review of Systems: Review of systems as noted in the HPI. All other systems reviewed and are negative.     Hospital Course:    A/p  1)Acute metabolic encephalopathy possibly due polypharmacy -As per Patient's daughter Luana Shu and granddaughter at bedside patient's mentation is back to baseline after holding oxycodone, melatonin and Xanax for the last 16+ hours or so -Patient advised to avoid taking excessive amounts of Xanax ,melatonin and oxycodone together  -She was recently treated with antibiotics for UTI--however the urine culture from 08/03/2022 was actually negative -She did receive Rocephin this admission -Urine culture NGTD -No further antibiotics indicated at this time---no fevers and no UTI symptoms -EEG unremarkable -CT head and chest x-ray unremarkable   Noncompliance with medication regimen/medication errors -Patient lives with family members--- who will take over her Medication administration and Try to help    -Hypokalemia/dehydration--- -drinking and eating well - potassium replaced  -She received IV fluids  HIV Controlled.  Continue Biktarvy Patient follows with Dr. Algis Liming Recent HIV viral load 06/15/22- not detected. CD4 count 1139.    HTN--resume PTA medications including benazepril, terazosin and Norvasc   Mixed hyperlipidemia Continue Lipitor    PAD Continue Plavix, statin, aspirin  Discharge Condition: stable  Follow UP   Follow-up Information     Inez Catalina, MD. Schedule an appointment as soon as possible for a visit in 1 week(s).   Specialty: Internal Medicine Contact information: [address] [place] [address] [phone]                 Diet and Activity recommendation:  As advised  Discharge Instructions  Discharge Instructions     Call MD for:  difficulty breathing, headache or visual disturbances   Complete by: As directed    Call MD for:  persistant dizziness or light-headedness   Complete by: As directed    Call MD for:  persistant nausea and vomiting   Complete by: As directed    Call MD for:  temperature >100.4   Complete by: As directed    Diet - low sodium heart healthy   Complete by: As directed    Discharge instructions   Complete by: As directed    1)Please reduce Xanax/alprazolam to 1 to 2 tablets at bedtime only----please avoid taking excessive amount of strenuous  2) follow-up with PCP in about within a week  for recheck and reevaluation   Increase activity slowly   Complete by: As directed        Discharge Medications     Allergies as of 08/10/2022       Reactions   Hctz [hydrochlorothiazide] Other (See Comments)   Dizziness, syncope; does NOT wish to take anymore        Medication List     STOP taking these medications    cefpodoxime 200 MG tablet Commonly known as: VANTIN   dextromethorphan-guaiFENesin 30-600 MG 12hr tablet Commonly known as: MUCINEX DM   moxifloxacin 0.5 % ophthalmic solution Commonly known as: VIGAMOX       TAKE these medications    acetaminophen 325 MG tablet Commonly known as: TYLENOL Take 2 tablets (650 mg total) by mouth every 6 (six) hours as needed for mild pain or fever (or Fever >/= 101).   acyclovir 400 MG tablet Commonly known as: ZOVIRAX TAKE 1 TABLET BY MOUTH THREE TIMES DAILY AS NEEDED FOR OUTBREAKS FOR  SEVEN DAYS What changed: See the new instructions.   alendronate 70 MG  tablet Commonly known as: FOSAMAX Take 1 tablet (70 mg total) by mouth every Sunday. TAKE 1 TABLET BY MOUTH ONCE WEEKLY ON SUNDAY take with a full glass of water on an empty stomach Strength: 70 mg   ALPRAZolam 0.5 MG tablet Commonly known as: XANAX Take 1-2 tablets (0.5-1 mg total) by mouth at bedtime as needed for anxiety or sleep. 1 to 2 Tab at BEDTIME AS NEEDED FOR anxiety/sleep What changed: See the new instructions.   amLODipine 10 MG tablet Commonly known as: NORVASC Take 1 tablet (10 mg total) by mouth daily.   ammonium lactate 12 % cream Commonly known as: AMLACTIN Apply topically.   aspirin EC 81 MG tablet Take 1 tablet (81 mg total) by mouth daily with breakfast.   atorvastatin 10 MG tablet Commonly known as: LIPITOR Take 1 tablet (10 mg total) by mouth at bedtime.   benazepril 40 MG tablet Commonly known as: LOTENSIN Take 1 tablet (40 mg total) by mouth daily.   Biktarvy 50-200-25 MG Tabs tablet Generic drug: bictegravir-emtricitabine-tenofovir AF Take 1 tablet by mouth daily.   clopidogrel 75 MG tablet Commonly known as: PLAVIX Take 1 tablet (75 mg total) by mouth daily.   diclofenac Sodium 1 % Gel Commonly known as: VOLTAREN APPLY TWO GRAMS TO AFFECTED AREA(S) FOUR TIMES DAILY What changed: See the new instructions.   dorzolamide-timolol 2-0.5 % ophthalmic solution Commonly known as: COSOPT Place 1 drop into the right eye 2 (two) times daily. What changed: Another medication with the same name was removed. Continue taking this medication, and follow the directions you see here.   gabapentin 300 MG capsule Commonly known as: Neurontin Take 2 capsules (600 mg total) by mouth 3 (three) times daily.   melatonin 5 MG Tabs Take 1 tablet (5 mg total) by mouth at bedtime as needed. What changed:  when to take this reasons to take this   multivitamin with minerals Tabs tablet Take 1  tablet by mouth daily.   oxyCODONE-acetaminophen 5-325 MG tablet Commonly known as: PERCOCET/ROXICET TAKE 1 TABLET BY MOUTH EVERY TWELVE HOURS AS NEEDED FOR PAIN What changed: See the new instructions.   phenylephrine 1 % nasal spray Commonly known as: NEO-SYNEPHRINE Place 1 drop into both nostrils every 6 (six) hours as needed for congestion.   polyethylene glycol 17 g packet Commonly known as: MIRALAX / GLYCOLAX Take 17 g by mouth See admin instructions. Take 1 dose twice daily for 1 week and then 1 dose daily after that indefinitely   psyllium 0.52 g capsule Commonly known as: REGULOID Take 0.52 g by mouth daily.   terazosin 5 MG capsule Commonly known as: HYTRIN TAKE ONE CAPSULE BY MOUTH AT BEDTIME   triamcinolone ointment 0.1 % Commonly known as: KENALOG APPLY TO THE AFFECTED AREA(S) TWICE DAILY What changed:  how much to take when to take this reasons to take this additional instructions       Major procedures and Radiology Reports - PLEASE review detailed and final reports for all details, in brief -   EEG adult  Result Date: 08/10/2022 Charlsie Quest, MD     08/10/2022  1:27 PM Patient Name: Jessica [name] MRN: [id number] Epilepsy Attending: Charlsie Quest Referring Physician/Provider: Shon Hale, MD Date: 08/10/2022 Duration: 25.18 mins Patient history:  82 y.o. female with medical history significant of hypertension, PAD, HIV, anxiety, tobacco abuse who presents to the emergency department via EMS due to increased confusion and altered mental status. EEG to evaluate for  seizure. Level of alertness: Awake AEDs during EEG study: None Technical aspects: This EEG study was done with scalp electrodes positioned according to the 10-20 International system of electrode placement. Electrical activity was reviewed with band pass filter of 1-70Hz , sensitivity of 7 uV/mm, display speed of 58mm/sec with a 60Hz  notched filter applied as appropriate. EEG data were  recorded continuously and digitally stored.  Video monitoring was available and reviewed as appropriate. Description: The posterior dominant rhythm consists of 9 Hz activity of moderate voltage (25-35 uV) seen predominantly in posterior head regions, symmetric and reactive to eye opening and eye closing. Hyperventilation and photic stimulation were not performed.   IMPRESSION: This study is within normal limits. No seizures or epileptiform discharges were seen throughout the recording. A normal interictal EEG does not exclude  the diagnosis of epilepsy. Charlsie Quest   DG Chest Portable 1 View  Result Date: 08/09/2022 CLINICAL DATA:  Altered mental status EXAM: PORTABLE CHEST 1 VIEW COMPARISON:  08/15/2019, CT from 06/09/2022 FINDINGS: Cardiac shadow is stable. Aortic calcifications are again seen. Lungs are well aerated bilaterally. No focal infiltrate or effusion is seen. Chronic interstitial prominence is noted stable from the prior study. Known nodule in the right upper lobe is not well appreciated on this exam. No bony abnormality is seen. IMPRESSION: No acute abnormality noted. Electronically Signed   By: Alcide Clever M.D.   On: 08/09/2022 15:59   CT Head Wo Contrast  Result Date: 08/09/2022 CLINICAL DATA:  Altered mental status EXAM: CT HEAD WITHOUT CONTRAST TECHNIQUE: Contiguous axial images were obtained from the base of the skull through the vertex without intravenous contrast. RADIATION DOSE REDUCTION: This exam was performed according to the departmental dose-optimization program which includes automated exposure control, adjustment of the mA and/or kV according to patient size and/or use of iterative reconstruction technique. COMPARISON:  08/03/2022 FINDINGS: Brain: No evidence of acute infarction, hemorrhage, hydrocephalus, extra-axial collection or mass lesion/mass effect. Mild chronic white matter ischemic changes are seen. Vascular: No hyperdense vessel or unexpected calcification. Skull:  Normal. Negative for fracture or focal lesion. Sinuses/Orbits: No acute finding. Other: None. IMPRESSION: Chronic white matter ischemic change without acute abnormality. Electronically Signed   By: Alcide Clever M.D.   On: 08/09/2022 15:48   CT Head Wo Contrast  Result Date: 08/03/2022 CLINICAL DATA:  Headache today mental status change. EXAM: CT HEAD WITHOUT CONTRAST TECHNIQUE: Contiguous axial images were obtained from the base of the skull through the vertex without intravenous contrast. RADIATION DOSE REDUCTION: This exam was performed according to the departmental dose-optimization program which includes automated exposure control, adjustment of the mA and/or kV according to patient size and/or use of iterative reconstruction technique. COMPARISON:  CT head dated July 11, 2018 FINDINGS: Brain: No evidence of acute infarction, hemorrhage, hydrocephalus, extra-axial collection or mass lesion/mass effect. Chronic left cerebellar infarcts. Mild chronic microvascular ischemic changes of the white matter. Vascular: No hyperdense vessel or unexpected calcification. Skull: Normal. Negative for fracture or focal lesion. Sinuses/Orbits: No acute finding. Other: None. IMPRESSION: 1. No acute intracranial abnormality. 2. Chronic left cerebellar infarcts. Mild chronic microvascular ischemic changes of the white matter. Electronically Signed   By: Larose Hires D.O.   On: 08/03/2022 13:09   CT ABDOMEN PELVIS W CONTRAST  Result Date: 07/11/2022 CLINICAL DATA:  Left lower quadrant abdominal pain EXAM: CT ABDOMEN AND PELVIS WITH CONTRAST TECHNIQUE: Multidetector CT imaging of the abdomen and pelvis was performed using the standard protocol following bolus administration of intravenous contrast.  RADIATION DOSE REDUCTION: This exam was performed according to the departmental dose-optimization program which includes automated exposure control, adjustment of the mA and/or kV according to patient size and/or use of iterative  reconstruction technique. CONTRAST:  OMNIPAQUE IOHEXOL 300 MG/ML  SOLN COMPARISON:  CT abdomen and pelvis 09/06/2013 FINDINGS: Lower chest: Bibasilar atelectasis/scarring. Cardiomegaly. No acute abnormality. Hepatobiliary: Cholecystectomy. Prominent intra and extrahepatic bile ducts likely due to reservoir effect. Unremarkable liver. Pancreas: Unremarkable. Spleen: Unremarkable. Adrenals/Urinary Tract: Stable adrenal glands. Bilateral cortical renal scarring. Low-attenuation lesions in the kidneys are statistically likely to represent cysts. No follow-up is required. No obstructing urinary calculi or hydronephrosis. Unremarkable bladder for degree of distention. Stomach/Bowel: Extensive colonic diverticulosis. There is mild wall thickening and adjacent fluid and stranding about the sigmoid colon. Additional wall thickening and mucosal hyperenhancement of the right colon with stranding and fluid about the cecum. No evidence of obstruction. Stomach is within normal limits. Patulous small bowel anastomosis in the left abdomen. Fecalization of the terminal ileum. Appendectomy. Small hiatal hernia. 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 This is age indeterminate but new since the most recent comparison in 73. 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 No lymphadenopathy. Reproductive: Hysterectomy. Other: Small volume free fluid in the pelvis and about the cecum. No free intraperitoneal air. Musculoskeletal: No acute osseous abnormality. IMPRESSION: 1. Wall thickening and mucosal hyperenhancement of the right colon and sigmoid colon with adjacent fluid and stranding. Findings are favored to represent infectious or inflammatory colitis/diverticulitis. No abscess or perforation. 2. Postoperative change of aortobifemoral bypass graft. There is occlusion of  the left superficial femoral artery. This is age indeterminate and may be chronic but new since the most recent comparison in 2015. 3. 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 4. Fecalization of the terminal ileum compatible with slow transit/constipation. Critical Value/emergent results were called by telephone at the time of interpretation on 07/11/2022 at 4:25 pm to provider Eber Hong, MD, who verbally acknowledged these results. Electronically Signed   By: Minerva Fester M.D.   On: 07/11/2022 16:26   CT Head Wo Contrast  Result Date: 07/11/2022 CLINICAL DATA:  Trauma EXAM: CT HEAD WITHOUT CONTRAST TECHNIQUE: Contiguous axial images were obtained from the base of the skull through the vertex without intravenous contrast. RADIATION DOSE REDUCTION: This exam was performed according to the departmental dose-optimization program which includes automated exposure control, adjustment of the mA and/or kV according to patient size and/or use of iterative reconstruction technique. COMPARISON:  CT Head 04/08/18 FINDINGS: Brain: No evidence of acute infarction, hemorrhage, hydrocephalus, extra-axial collection or mass lesion/mass effect. 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 Vascular: No hyperdense vessel or unexpected calcification. Skull: Normal. Negative for fracture or focal lesion. Sinuses/Orbits: No middle ear or mastoid effusion. Paranasal sinuses are clear. Right lens replacement and glaucoma drainage catheter. Orbits are otherwise unremarkable. Other: None. IMPRESSION: No CT evidence of intracranial injury Electronically Signed   By: Lorenza Cambridge M.D.   On: 07/11/2022 16:09    Micro Results  Recent Results (from the past 240 hour(s))  Urine Culture     Status: Abnormal   Collection Time: 08/03/22 11:56 AM   Specimen: Urine, Clean Catch  Result Value Ref Range Status   Specimen Description   Final    URINE, CLEAN CATCH Performed at  [facility], [address] [place], [place] [address]  Special Requests   Final    NONE Performed at [facility], [address] [place], [place] [address]    Culture MULTIPLE SPECIES PRESENT, SUGGEST RECOLLECTION (A)  Final   Report Status 08/04/2022 FINAL  Final  Blood culture (routine x 2)     Status: None (Preliminary result)   Collection Time: 08/09/22  3:41 PM   Specimen: BLOOD  Result Value Ref Range Status   Specimen Description   Final    BLOOD BLOOD RIGHT ARM BOTTLES DRAWN AEROBIC AND ANAEROBIC Blood Culture adequate volume   Special Requests Immunocompromised  Final   Culture   Final    NO GROWTH < 24 HOURS Performed at [facility], [address] [place], [place] [address]    Report Status PENDING  Incomplete  Blood culture (routine x 2)     Status: None (Preliminary result)   Collection Time: 08/09/22  3:51 PM   Specimen: BLOOD  Result Value Ref Range Status   Specimen Description   Final    BLOOD BLOOD LEFT ARM AEROBIC BOTTLE ONLY Blood Culture results may not be optimal due to an inadequate volume of blood received in culture bottles   Special Requests Immunocompromised  Final   Culture   Final    NO GROWTH < 24 HOURS Performed at [facility], [address], [place], [place] [address]    Report Status PENDING  Incomplete   Today   Subjective    Jessica [name] today has no new concerns   As per Patient's daughter Luana Shu and granddaughter at bedside patient's mentation is back to baseline after holding oxycodone, melatonin and Xanax for the last 16+ hours or so  No fever  Or chills  No Nausea, Vomiting or Diarrhea  Pt is alert, conversational, coherent and appropriate   Patient has been seen and examined prior to discharge   Objective   Blood pressure 106/61, pulse 88, temperature 97.7 F (36.5 C), temperature source Oral, resp. rate 16, height 5\' 10"  (1.778 m), weight 54.2 kg, SpO2 98 %.   Intake/Output Summary (Last 24 hours)  at 08/10/2022 1509 Last data filed at 08/10/2022 1438 Gross per 24 hour  Intake 1748.15 ml  Output 1675 ml  Net 73.15 ml   Exam Gen:- Awake Alert, no acute distress , Pt is alert, conversational, coherent and appropriate HEENT:- [place].AT, No sclera icterus Neck-Supple Neck,No JVD,.  Lungs-  CTAB , good air movement bilaterally CV- S1, S2 normal, regular Abd-  +ve B.Sounds, Abd Soft, No tenderness, no CVA area tenderness Extremity/Skin:- No  edema,   good pulses Psych-affect is appropriate, oriented x3 Neuro-no new focal deficits, no tremors    Data Review   CBC w Diff:  Lab Results  Component Value Date   WBC 5.7 08/10/2022   HGB 15.3 (H) 08/10/2022   HGB 12.9 12/30/2021   HCT 45.5 08/10/2022   HCT 37.6 12/30/2021   PLT 308 08/10/2022   PLT 318 12/30/2021   LYMPHOPCT 41 08/09/2022   MONOPCT 7 08/09/2022   EOSPCT 0 08/09/2022   BASOPCT 1 08/09/2022   CMP:  Lab Results  Component Value Date   NA 137 08/10/2022   NA 142 12/30/2021   K 3.5 08/10/2022   CL 106 08/10/2022   CO2 19 (L) 08/10/2022   BUN 7 (L) 08/10/2022   BUN 14 12/30/2021   CREATININE 0.53 08/10/2022   CREATININE 0.68 06/15/2022   PROT 8.5 (H) 08/10/2022   PROT 7.6 03/16/2017   ALBUMIN  4.1 08/10/2022   ALBUMIN 4.3 03/16/2017   BILITOT 0.7 08/10/2022   BILITOT 0.3 03/16/2017   ALKPHOS 74 08/10/2022   AST 25 08/10/2022   ALT 13 08/10/2022   Total Discharge time is about 33 minutes  Shon Hale M.D on 08/10/2022 at 3:09 PM  Go to [email or web address] -  for contact info  [facility] - Office  [phone]

## 2022-08-10 NOTE — Progress Notes (Signed)
   08/10/22 1346  Assess: MEWS Score  BP 106/61  MAP (mmHg) 75  Pulse Rate 88  Resp 16  SpO2 98 %  Assess: MEWS Score  MEWS Temp 0  MEWS Systolic 0  MEWS Pulse 0  MEWS RR 0  MEWS LOC 0  MEWS Score 0  MEWS Score Color Green  Assess: SIRS CRITERIA  SIRS Temperature  0  SIRS Pulse 0  SIRS Respirations  0  SIRS WBC 0  SIRS Score Sum  0

## 2022-08-10 NOTE — Care Management Obs Status (Signed)
MEDICARE OBSERVATION STATUS NOTIFICATION   Patient Details  Name: Anah Hobday MRN: [id number] Date of Birth: [birth year]   Medicare Observation Status Notification Given:  Yes    Elliot Gault, LCSW 08/10/2022, 12:22 PM

## 2022-08-11 ENCOUNTER — Inpatient Hospital Stay: Payer: Medicare Other | Admitting: Internal Medicine

## 2022-08-11 LAB — CULTURE, BLOOD (ROUTINE X 2)

## 2022-08-11 LAB — URINE CULTURE: Culture: NO GROWTH

## 2022-08-12 LAB — CULTURE, BLOOD (ROUTINE X 2): Culture: NO GROWTH

## 2022-08-13 MED ORDER — OXYCODONE-ACETAMINOPHEN 5-325 MG PO TABS: 1.0000 | ORAL_TABLET | ORAL | 0 refills | Status: DC

## 2022-08-15 LAB — CULTURE, BLOOD (ROUTINE X 2)

## 2022-08-16 ENCOUNTER — Encounter: Payer: Self-pay | Admitting: Student

## 2022-08-16 ENCOUNTER — Ambulatory Visit (INDEPENDENT_AMBULATORY_CARE_PROVIDER_SITE_OTHER): Payer: Medicare Other | Admitting: Student

## 2022-08-16 ENCOUNTER — Other Ambulatory Visit: Payer: Self-pay

## 2022-08-16 VITALS — BP 102/39 | HR 78 | Temp 97.9°F | Ht 67.0 in | Wt 123.2 lb

## 2022-08-16 DIAGNOSIS — G9341 Metabolic encephalopathy: Secondary | ICD-10-CM

## 2022-08-16 NOTE — Progress Notes (Signed)
CC: Hospital follow-up  HPI:  Jessica [name] is a 82 y.o. female living with a history stated below and presents today for hospital follow-up. Please see problem based assessment and plan for additional details.  Past Medical History:  Diagnosis Date   Acute blood loss anemia 08/08/2020   Acute renal failure (ARF) (HCC) 08/17/2017   Anxiety 04/05/2012   Bilateral sensorineural hearing loss 06/11/2014   Mild to moderate on the left side and slight to mild on the right side per audiometry 05/2014.  Hearing aides with possible masking of tinnitus recommended but patient wished to defer secondary to finances.   Blood transfusion without reported diagnosis    pt denies   Bursitis of right shoulder 07/12/2012   s/p shoulder injection 07/12/2012    Cataract of right eye    REMOVED RIGHT EYE 4-19    Constipation due to pain medication 04/27/2010   Diverticulosis 02/08/2012   Extensive left-sided diverticula on colonoscopy March 2012 per Dr. Jena Gauss    Essential hypertension 07/20/2006   Genital herpes 07/20/2006   Glaucoma of left eye 07/20/2006   Headache 10/20/2019   Heart murmur 1961   Human immunodeficiency virus disease (HCC) 03/27/1986   Hyperlipidemia LDL goal < 100 04/05/2012   Hypokalemia 04/12/2018   Insomnia 10/20/2019   Left hip pain 04/11/2021   Leg pain 04/11/2021   Long-term current use of opiate analgesic 03/17/2016   Loose stools 04/08/2019   Lumbar degenerative disc disease 07/20/2006   With chronic back pain    Marijuana use 07/03/2016   Micturition syncope 09/20/2015   Nausea and vomiting 04/08/2019   Opiate dependence (HCC) 04/12/2018   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   s/p aortobifem bypass 2009    Periumbilical hernia 05/18/2014   1 cm left periumbilical abdominal wall defect   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   DEXA 04/15/2012: L1-L4 spine T -3.9, Right femur T -3.0    Right rotator cuff tear 02/01/2013   Responds to periodic steroid injections    Seborrhea 09/01/2010   Shoulder pain, right 12/04/2017   Small bowel obstruction due to adhesions (HCC) 02/08/2012   s/p Exploratory laparotomy, lysis of adhesions 02/12/12     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 100 tablet 2   acyclovir (ZOVIRAX) 400 MG tablet TAKE 1 TABLET BY MOUTH THREE TIMES DAILY AS NEEDED FOR OUTBREAKS FOR SEVEN DAYS (Patient taking differently: Take 400 mg by mouth 3 (three) times daily as needed. For severe outbreaks for 7 days) 21 tablet 5   alendronate (FOSAMAX) 70 MG tablet Take 1 tablet (70 mg total) by mouth every Sunday. TAKE 1 TABLET BY MOUTH ONCE WEEKLY ON SUNDAY take with a full glass of water on an empty stomach Strength: 70 mg 12 tablet 3   ALPRAZolam (XANAX) 0.5 MG tablet Take 1-2 tablets (0.5-1 mg total) by mouth at bedtime as needed for anxiety or sleep. 1 to 2 Tab  at BEDTIME AS NEEDED FOR anxiety/sleep 10 tablet 0   amLODipine (NORVASC) 10 MG tablet Take 1 tablet (10 mg total) by mouth daily. 90 tablet 3   ammonium lactate (AMLACTIN) 12 % cream Apply topically.     aspirin EC 81 MG tablet Take 1 tablet (81 mg total) by mouth daily with breakfast. 30 tablet 5   atorvastatin (LIPITOR) 10 MG tablet Take 1 tablet (10 mg total) by mouth at bedtime. 90 tablet 3   benazepril (LOTENSIN) 40 MG tablet Take 1 tablet (40 mg total) by mouth daily. 90 tablet 3   bictegravir-emtricitabine-tenofovir AF (BIKTARVY) 50-200-25 MG TABS tablet Take 1 tablet by mouth daily. 30  tablet 11   clopidogrel (PLAVIX) 75 MG tablet Take 1 tablet (75 mg total) by mouth daily. 90 tablet 3   diclofenac Sodium (VOLTAREN) 1 % GEL APPLY TWO GRAMS TO AFFECTED AREA(S) FOUR TIMES DAILY (Patient taking differently: Apply 2 g topically 4 (four) times daily.) 350 g 2   dorzolamide-timolol (COSOPT) 2-0.5 % ophthalmic solution Place 1 drop into the right eye 2 (two) times daily.     gabapentin (NEURONTIN) 300 MG capsule Take 2 capsules (600 mg total) by mouth 3 (three) times daily. 540 capsule 3   melatonin 5 MG TABS Take 1 tablet (5 mg total) by mouth at bedtime as needed. 10 tablet 0   Multiple Vitamin (MULTIVITAMIN WITH MINERALS) TABS Take 1 tablet by mouth daily.     oxyCODONE-acetaminophen (PERCOCET/ROXICET) 5-325 MG tablet Take 1 tablet by mouth See admin instructions. TAKE 1 TABLET BY MOUTH EVERY TWELVE HOURS AS NEEDED FOR PAIN 60 tablet 0   phenylephrine (NEO-SYNEPHRINE) 1 % nasal spray Place 1 drop into both nostrils every 6 (six) hours as needed for congestion.     polyethylene glycol (MIRALAX / GLYCOLAX) 17 g packet Take 17 g by mouth See admin instructions. Take 1 dose twice daily for 1 week and then 1 dose daily after that indefinitely 45 each 2   psyllium (REGULOID) 0.52 g capsule Take 0.52 g by mouth daily.     terazosin (HYTRIN) 5 MG capsule TAKE ONE CAPSULE BY MOUTH AT BEDTIME 90 capsule 3   triamcinolone ointment (KENALOG) 0.1 % APPLY TO THE AFFECTED AREA(S) TWICE DAILY (Patient taking differently: Apply 1 application  topically 2 (two) times daily as needed (rash).) 454 g 3   No current facility-administered medications on file prior to visit.    Family History  Problem Relation Age of Onset   Kidney failure Mother    Diabetes Mother    Hypertension Mother    Heart disease Mother    Glaucoma Father    Congestive Heart Failure Sister    Diabetes Sister    Kidney disease Sister    Diabetes Brother    Unexplained death Brother 81       Automobile accident    Hypothyroidism Daughter    Arthritis Daughter        Neck/Back   Healthy Son    HIV/AIDS Brother    HIV Daughter    Kidney disease Daughter    Arthritis Son        Knee   Colon cancer Neg Hx    Colon polyps Neg Hx    Esophageal cancer Neg Hx    Rectal cancer Neg Hx    Stomach cancer Neg Hx     Social History   Socioeconomic History   Marital status: Widowed    Spouse name: Not on file  Number of children: 4   Years of education: 2y college   Highest education level: Not on file  Occupational History   Occupation: retired    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   Tobacco Use   Smoking status: Every Day    Packs/day: 0.30    Years: 50.00    Additional pack years: 0.00    Total pack years: 15.00    Types: Cigarettes    Passive exposure: Current   Smokeless tobacco: Never   Tobacco comments:    1/3 pk per day  Vaping Use   Vaping Use: Never used  Substance and Sexual Activity   Alcohol use: No    Alcohol/week: 0.0 standard drinks of alcohol    Comment: "last drink of alcohol ~ 1977"   Drug use: No   Sexual activity: Never  Other Topics Concern   Not on file  Social History Narrative   Lives alone in [place], [place]   Social Determinants of Health   Financial Resource Strain: Low Risk  (08/16/2022)   Overall Financial Resource Strain (CARDIA)    Difficulty of Paying Living Expenses: Not hard at all  Food Insecurity: No Food Insecurity (08/16/2022)   Hunger Vital Sign    Worried About Running Out of Food in the Last Year: Never true    Ran Out of Food in the Last Year: Never true  Transportation Needs: No Transportation Needs (08/16/2022)   PRAPARE - Administrator, Civil Service (Medical): No    Lack of Transportation (Non-Medical): No  Physical Activity: Insufficiently Active (08/16/2022)   Exercise Vital Sign    Days of Exercise per Week: 1 day    Minutes of Exercise per Session: 10 min  Stress: Stress Concern Present (08/16/2022)    [employer] of Occupational Health - Occupational Stress Questionnaire    Feeling of Stress : To some extent  Social Connections: Moderately Isolated (08/16/2022)   Social Connection and Isolation Panel [NHANES]    Frequency of Communication with Friends and Family: More than three times a week    Frequency of Social Gatherings with Friends and Family: More than three times a week    Attends Religious Services: More than 4 times per year    Active Member of [employer] or Organizations: No    Attends Banker Meetings: Never    Marital Status: Widowed  Intimate Partner Violence: Not At Risk (08/16/2022)   Humiliation, Afraid, Rape, and Kick questionnaire    Fear of Current or Ex-Partner: No    Emotionally Abused: No    Physically Abused: No    Sexually Abused: No    Review of Systems: ROS negative except for what is noted on the assessment and plan.  Vitals:   08/16/22 0919  BP: (!) 102/39  Pulse: 78  Temp: 97.9 F (36.6 C)  SpO2: 92%  Weight: 123 lb 3.2 oz (55.9 kg)  Height: 5\' 7"  (1.702 m)    Physical Exam: Constitutional: Elderly female, sitting in wheel chair, in no acute distress HENT: normocephalic atraumatic, mucous membranes moist Cardiovascular: regular rate and rhythm, no m/r/g Pulmonary/Chest: normal work of breathing on room air, lungs clear to auscultation bilaterally Abdominal: soft, non-tender, non-distended   Assessment & Plan:   Acute metabolic encephalopathy Patient presents as follow-up for altered mental status, likely secondary to polypharmacy.  She states that she took 3 of her Xanax pills, and that led to her hospitalization.  In the hospital she was  combative, aggressive, and daughter was able to tell that she was not acting like herself.  She is on multiple centrally acting medications that could affect her mental status such as her Xanax (1 mg at night), Percocet 5-325, gabapentin.  Upon speaking with daughter, before hospitalization  patient was administering her own meds to herself.  Since hospitalization, patient is living with her son and nephew and now they administer all of her medications.  Patient is alert and orientated and able to hold conversation well.  She does have recollection of the hospital event, and feels like she is 100% like herself currently.  Daughter agrees that patient is back to her baseline.  They would like to try safeguarding her medication, before adjusting dosages to eventually help wean her off.  In the meanwhile, will need to do everything to mitigate risk of falling for this patient.  She does walk with a cane, and does endorse chronic pain for which she takes Percocet for.  On top of that she has multiple centrally acting medications.  She is also taking terazosin for her blood pressure.  Upon chart review seems as if she was hypertensive before, however since her blood pressure is low (102/39) we will stop the terazosin.  On recheck, blood pressure, up to 127/57.  Orthostatics are negative.  Plan: - Stop terazosin - Mitigate fall risks - Patient would benefit from geriatric assessment - Consider gradually decreasing dose of centrally acting medication  Patient discussed with Dr. Pauline Good Bella Brummet, M.D. [facility], PGY-1 Pager: [phone] Date 08/16/2022 Time 2:12 PM

## 2022-08-16 NOTE — Assessment & Plan Note (Signed)
Patient presents as follow-up for altered mental status, likely secondary to polypharmacy.  She states that she took 3 of her Xanax pills, and that led to her hospitalization.  In the hospital she was combative, aggressive, and daughter was able to tell that she was not acting like herself.  She is on multiple centrally acting medications that could affect her mental status such as her Xanax (1 mg at night), Percocet 5-325, gabapentin.  Upon speaking with daughter, before hospitalization patient was administering her own meds to herself.  Since hospitalization, patient is living with her son and nephew and now they administer all of her medications.  Patient is alert and orientated and able to hold conversation well.  She does have recollection of the hospital event, and feels like she is 100% like herself currently.  Daughter agrees that patient is back to her baseline.  They would like to try safeguarding her medication, before adjusting dosages to eventually help wean her off.  In the meanwhile, will need to do everything to mitigate risk of falling for this patient.  She does walk with a cane, and does endorse chronic pain for which she takes Percocet for.  On top of that she has multiple centrally acting medications.  She is also taking terazosin for her blood pressure.  Upon chart review seems as if she was hypertensive before, however since her blood pressure is low (102/39) we will stop the terazosin.  On recheck, blood pressure, up to 127/57.  Orthostatics are negative.  Plan: - Stop terazosin - Mitigate fall risks - Patient would benefit from geriatric assessment - Consider gradually decreasing dose of centrally acting medication

## 2022-08-16 NOTE — Patient Instructions (Addendum)
Thank you so much for coming to the clinic today!   It's very important to not take any extra medications than what you need, I will not make any changes to the xanax , gabapentin, and the Percocet.  However, I do think you should stop taking the terazosin.  Ideally, we would get you off of those medications like the Percocet and the Xanax as time goes on.  Here is the number for the Dr. Margaretha Sheffield: [phone]  If you have any questions please feel free to the call the clinic at anytime at [phone]. It was a pleasure seeing you!  Best, Dr. Thomasene Ripple

## 2022-08-24 NOTE — Progress Notes (Signed)
Internal Medicine Clinic Attending  Case discussed with Dr. Nooruddin  At the time of the visit.  We reviewed the resident's history and exam and pertinent patient test results.  I agree with the assessment, diagnosis, and plan of care documented in the resident's note.  

## 2022-09-05 ENCOUNTER — Ambulatory Visit (INDEPENDENT_AMBULATORY_CARE_PROVIDER_SITE_OTHER): Payer: Medicare Other | Admitting: Sports Medicine

## 2022-09-05 ENCOUNTER — Other Ambulatory Visit: Payer: Self-pay | Admitting: Internal Medicine

## 2022-09-05 VITALS — BP 128/74 | Ht 66.0 in | Wt 126.0 lb

## 2022-09-05 DIAGNOSIS — M25552 Pain in left hip: Secondary | ICD-10-CM | POA: Diagnosis not present

## 2022-09-05 DIAGNOSIS — M5416 Radiculopathy, lumbar region: Secondary | ICD-10-CM

## 2022-09-05 DIAGNOSIS — F411 Generalized anxiety disorder: Secondary | ICD-10-CM

## 2022-09-05 MED ORDER — PREDNISONE 10 MG PO TABS: ORAL_TABLET | ORAL | 0 refills | Status: DC

## 2022-09-05 MED ORDER — METHYLPREDNISOLONE ACETATE 40 MG/ML IJ SUSP
80.0000 mg | Freq: Once | INTRAMUSCULAR | Status: AC
  Administered 2022-09-05: 80 mg via INTRAMUSCULAR

## 2022-09-05 NOTE — Progress Notes (Signed)
   Subjective:    Patient ID: Jessica [name], female    DOB: 03-Oct-1940, 82 y.o.   MRN: [id number]  HPI chief complaint: Left leg pain.  Patient presents today with returning left hip and leg pain.  She was seen in the office in May of last year with a similar complaint.  She was diagnosed with lumbar radiculopathy at that time.  An IM Depo-Medrol injection followed by 6-day Sterapred Dosepak resolved her symptoms.  Pain has now returned.  No known trauma.  She localizes the pain to the posterior aspect of her left hip with radiating pain down the left leg to the left knee.  She will occasionally get some radiating pain past the knee but it is not longstanding.  She denies groin pain.  Review of Systems As above    Objective:   Physical Exam  Well-developed, well-nourished.  No acute distress  Left hip: Smooth painless hip range of motion with a negative logroll  Neurological exam: Negative straight leg raise on the left.  Reflexes are equal at the Achilles and patellar tendons bilaterally.  No atrophy.  4+/5 strength with resisted great toe extension on the left compared to 5/5 on the right.  Otherwise, strength is 5/5 in both lower extremities.      Assessment & Plan:   Returning left leg pain secondary to lumbar radiculopathy  Given her positive response to IM Depo-Medrol injection followed by a Sterapred Dosepak in May of last year, we will repeat that treatment today.  If she does not experience symptom relief with this, then consider imaging at that time of her lumbar spine.  Follow-up for ongoing or recalcitrant issues.  This note was dictated using Dragon naturally speaking software and may contain errors in syntax, spelling, or content which have not been identified prior to signing this note.

## 2022-09-21 ENCOUNTER — Other Ambulatory Visit: Payer: Self-pay | Admitting: Internal Medicine

## 2022-09-21 NOTE — Telephone Encounter (Addendum)
Last rx written - 08/13/22. Last OV - 6/5 with Dr Nooruddin. Next OV- has not been scheduled. UDS - 09/28/22.

## 2022-09-27 ENCOUNTER — Telehealth: Payer: Self-pay | Admitting: *Deleted

## 2022-09-27 ENCOUNTER — Ambulatory Visit (INDEPENDENT_AMBULATORY_CARE_PROVIDER_SITE_OTHER): Payer: Medicare Other | Admitting: Student

## 2022-09-27 ENCOUNTER — Encounter: Payer: Self-pay | Admitting: Student

## 2022-09-27 VITALS — BP 150/60 | HR 67 | Temp 98.0°F | Ht 67.0 in | Wt 123.1 lb

## 2022-09-27 DIAGNOSIS — H5316 Psychophysical visual disturbances: Secondary | ICD-10-CM

## 2022-09-27 DIAGNOSIS — R441 Visual hallucinations: Secondary | ICD-10-CM | POA: Diagnosis not present

## 2022-09-27 DIAGNOSIS — R300 Dysuria: Secondary | ICD-10-CM | POA: Diagnosis not present

## 2022-09-27 HISTORY — DX: Psychophysical visual disturbances: H53.16

## 2022-09-27 LAB — POCT URINALYSIS DIPSTICK
Bilirubin, UA: NEGATIVE
Glucose, UA: NEGATIVE
Ketones, UA: NEGATIVE
Nitrite, UA: POSITIVE
Protein, UA: NEGATIVE
Spec Grav, UA: 1.02 (ref 1.010–1.025)
Urobilinogen, UA: 1 E.U./dL
pH, UA: 5.5 (ref 5.0–8.0)

## 2022-09-27 MED ORDER — CEPHALEXIN 500 MG PO CAPS
500.0000 mg | ORAL_CAPSULE | Freq: Two times a day (BID) | ORAL | 0 refills | Status: DC
Start: 2022-09-27 — End: 2022-09-27

## 2022-09-27 NOTE — Assessment & Plan Note (Addendum)
Patient endorses urinary frequency, urgency.  She was recently discharged from hospital on 08/10/2022 with a diagnosis of acute metabolic encephalopathy.  Where her hospital course was complicated by presumed UTI treated with IV antibiotics which later showed negative cultures.  Of note she has a history of HIV.  She denies fevers, chills, or pain with urination.  Point-of-care urine dipstick showed moderate blood, positive nitrates, 1+ leukocytes.  Plan: - Keflex 500 mg twice daily for 1 week. - Urine sent for culture in setting of HIV, recent UTI during hospitalization.

## 2022-09-27 NOTE — Telephone Encounter (Signed)
Call from patient recent discharge from hospital.  Confusion, bumping into walls when she goes to the bathroom.  Frequent urination.  Patient given an appointment for this afternoon.

## 2022-09-27 NOTE — Assessment & Plan Note (Addendum)
Patient recently discharged from hospital on 08/10/2022 with a diagnosis of acute metabolic encephalopathy.  There has been significant concern for polypharmacy in this patient who takes Percocet, gabapentin, Xanax.  Patient seen today primarily for complaints of urinary urgency, frequency concerning for UTI.  However patient also endorses continued visual hallucinations.  I spoke with the patient's son as well as her sister and nephew who she lives with to attempt to obtain collateral data.  The patient endorses a 2 to 3-year history of visual hallucinations.  She says she sees people mostly, with common hallucinations such as "children climbing and jumping out of the couch".  She cannot always decide whether or not her hallucinations are real or fake.  She is blind in her left eye and her vision in her right eye is significantly impaired.  She says she sees the hallucinations "more clearly that I can see your face".  Patient and family endorse periods of confusion, however they do note that the patient's visual hallucinations are not confined to these periods of confusion.  Patient endorses occasional auditory hallucinations which she describes as "like a conversation that she cannot quite make out".  Her family denies any inappropriate or disinhibited behavior.  They say her personality has remained similar for the last 3 years.  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  They state that her ataxia has not gotten any worse and has been stable during this time.  She is able to recall pertinent facts such as the medications she takes, physicians she is seen, and timelines regarding her medical history.   Assessment: It is possible that some of the patient's visual hallucinations may be related to her polypharmacy or visual impairment, however I would also be concerned for underlying etiology such as dementia with Lewy body given this patient's persistent visual hallucinations which are  unrelated to her episodes of altered mental status and have not really been affected by attempts to reduce her pharmacological treatments.  Her ataxia and gait also increase suspicion for this disease.  Plan: Patient has follow-up with her eye doctors which I urged her to attend. Patient was seen today in clinic with Dr. Mayford Knife.  Geriatrics referral has been placed previously but patient was unable to schedule and attend.  Encourage patient to schedule with Dr. Mayford Knife in geriatrics for more in-depth workup of her visual hallucinations and complex medical needs.

## 2022-09-27 NOTE — Progress Notes (Signed)
Subjective:  CC: Dysuria, visual hallucinations.  Recent hospitalization for acute metabolic encephalopathy discharged on 08/10/2022.  HPI:  [name] is a 82 y.o. female with a past medical history stated below and presents today for dysuria, visual hallucinations. Please see problem based assessment and plan for additional details.  Past Medical History:  Diagnosis Date   Acute blood loss anemia 08/08/2020   Acute renal failure (ARF) (HCC) 08/17/2017   Anxiety 04/05/2012   Bilateral sensorineural hearing loss 06/11/2014   Mild to moderate on the left side and slight to mild on the right side per audiometry 05/2014.  Hearing aides with possible masking of tinnitus recommended but patient wished to defer secondary to finances.   Blood transfusion without reported diagnosis    pt denies   Bursitis of right shoulder 07/12/2012   s/p shoulder injection 07/12/2012    Cataract of right eye    REMOVED RIGHT EYE 4-19    Constipation due to pain medication 04/27/2010   Diverticulosis 02/08/2012   Extensive left-sided diverticula on colonoscopy March 2012 per Dr. Jena Gauss    Essential hypertension 07/20/2006   Genital herpes 07/20/2006   Glaucoma of left eye 07/20/2006   Headache 10/20/2019   Heart murmur 1961   Human immunodeficiency virus disease (HCC) 03/27/1986   Hyperlipidemia LDL goal < 100 04/05/2012   Hypokalemia 04/12/2018   Insomnia 10/20/2019   Left hip pain 04/11/2021   Leg pain 04/11/2021   Long-term current use of opiate analgesic 03/17/2016   Loose stools 04/08/2019   Lumbar degenerative disc disease 07/20/2006   With chronic back pain    Marijuana use 07/03/2016   Micturition syncope 09/20/2015   Nausea and vomiting 04/08/2019   Opiate dependence (HCC) 04/12/2018   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   s/p aortobifem bypass 2009    Periumbilical hernia 05/18/2014   1 cm left periumbilical abdominal wall defect   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   DEXA  04/15/2012: L1-L4 spine T -3.9, Right femur T -3.0    Right rotator cuff tear 02/01/2013   Responds to periodic steroid injections   Seborrhea 09/01/2010   Shoulder pain, right 12/04/2017   Small bowel obstruction due to adhesions (HCC) 02/08/2012   s/p Exploratory laparotomy, lysis of adhesions 02/12/12     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 100 tablet 2   acyclovir (ZOVIRAX) 400 MG tablet TAKE 1 TABLET BY MOUTH THREE TIMES DAILY AS NEEDED FOR OUTBREAKS FOR SEVEN DAYS (Patient taking differently: Take 400 mg by mouth 3 (three) times daily as needed. For severe outbreaks for 7 days) 21 tablet 5   amLODipine (NORVASC) 10 MG tablet Take 1 tablet (10 mg total) by mouth daily. 90 tablet 3   aspirin EC 81 MG tablet Take 1 tablet (81 mg total) by mouth daily with breakfast. 30 tablet 5   atorvastatin (LIPITOR) 10 MG tablet Take 1 tablet (10 mg total) by  mouth at bedtime. 90 tablet 3   benazepril (LOTENSIN) 40 MG tablet Take 1 tablet (40 mg total) by mouth daily. 90 tablet 3   bictegravir-emtricitabine-tenofovir AF (BIKTARVY) 50-200-25 MG TABS tablet Take 1 tablet by mouth daily. 30 tablet 11   clopidogrel (PLAVIX) 75 MG tablet Take 1 tablet (75 mg total) by mouth daily. 90 tablet 3   diclofenac Sodium (VOLTAREN) 1 % GEL APPLY TWO GRAMS TO AFFECTED AREA(S) FOUR TIMES DAILY (Patient taking differently: Apply 2 g topically 4 (four) times daily.) 350 g 2   dorzolamide-timolol (COSOPT) 2-0.5 %  ophthalmic solution Place 1 drop into the right eye 2 (two) times daily.     gabapentin (NEURONTIN) 300 MG capsule Take 2 capsules (600 mg total) by mouth 3 (three) times daily. 540 capsule 3   oxyCODONE-acetaminophen (PERCOCET/ROXICET) 5-325 MG tablet Take 1 tablet by mouth every 12 (twelve) hours as needed for severe pain. 60 tablet 0   polyethylene glycol (MIRALAX / GLYCOLAX) 17 g packet Take 17 g by mouth See admin instructions. Take 1 dose twice daily for 1 week and then 1 dose daily after that indefinitely 45 each 2   alendronate (FOSAMAX) 70 MG tablet Take 1 tablet (70 mg total) by mouth every Sunday. TAKE 1 TABLET BY MOUTH ONCE WEEKLY ON SUNDAY take with a full glass of water on an empty stomach Strength: 70 mg 12 tablet 3   ALPRAZolam (XANAX) 0.5 MG tablet TAKE 2 TO 3 TABLETS BY MOUTH AT BEDTIME AS NEEDED FOR anxiety/sleep (Patient not taking: Reported on 09/27/2022) 90 tablet 1   ammonium lactate (AMLACTIN) 12 % cream Apply topically.     melatonin 5 MG TABS Take 1 tablet (5 mg total) by mouth at bedtime as needed. (Patient not taking: Reported on 09/27/2022) 10 tablet 0   Multiple Vitamin (MULTIVITAMIN WITH MINERALS) TABS Take 1 tablet by mouth daily. (Patient not taking: Reported on 09/27/2022)     phenylephrine (NEO-SYNEPHRINE) 1 % nasal spray Place 1 drop into both nostrils every 6 (six) hours as needed for congestion. (Patient not taking: Reported on 09/27/2022)     predniSONE (DELTASONE) 10 MG tablet Use as directed per doctors orders for the next 6 days. (Patient not taking: Reported on 09/27/2022) 21 tablet 0   psyllium (REGULOID) 0.52 g capsule Take 0.52 g by mouth daily.     triamcinolone ointment (KENALOG) 0.1 % APPLY TO THE AFFECTED AREA(S) TWICE DAILY (Patient taking differently: Apply 1 application  topically 2 (two) times daily as needed (rash).) 454 g 3   No current facility-administered medications on file prior to visit.    Family History  Problem Relation Age of Onset    Kidney failure Mother    Diabetes Mother    Hypertension Mother    Heart disease Mother    Glaucoma Father    Congestive Heart Failure Sister    Diabetes Sister    Kidney disease Sister    Diabetes Brother    Unexplained death Brother 27       Automobile accident   Hypothyroidism Daughter    Arthritis Daughter        Neck/Back   Healthy Son    HIV/AIDS Brother    HIV Daughter    Kidney disease Daughter    Arthritis Son        Knee   Colon cancer Neg Hx    Colon polyps Neg Hx    Esophageal cancer Neg Hx    Rectal cancer Neg Hx  Stomach cancer Neg Hx     Social History   Socioeconomic History   Marital status: Widowed    Spouse name: Not on file   Number of children: 4   Years of education: 2y college   Highest education level: Not on file  Occupational History   Occupation: retired    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   Tobacco Use   Smoking status: Every Day    Current packs/day: 0.30    Average packs/day: 0.3 packs/day for 50.0 years (15.0 ttl pk-yrs)    Types: Cigarettes    Passive exposure: Current   Smokeless tobacco: Never   Tobacco comments:    1/3 pk per day  Vaping Use   Vaping status: Never Used  Substance and Sexual Activity   Alcohol use: No    Alcohol/week: 0.0 standard drinks of alcohol    Comment: "last drink of alcohol ~ 1977"   Drug use: No   Sexual activity: Never  Other Topics Concern   Not on file  Social History Narrative   Lives alone in [place], [place]   Social Determinants of Health   Financial Resource Strain: Low Risk  (08/16/2022)   Overall Financial Resource Strain (CARDIA)    Difficulty of Paying Living Expenses: Not hard at all  Food Insecurity: No Food Insecurity (08/16/2022)   Hunger Vital Sign    Worried About Running Out of Food in the Last Year: Never true    Ran Out of Food in the Last Year: Never true  Transportation Needs: No Transportation Needs (08/16/2022)   PRAPARE - Scientist, research (physical sciences) (Medical): No    Lack of Transportation (Non-Medical): No  Physical Activity: Insufficiently Active (08/16/2022)   Exercise Vital Sign    Days of Exercise per Week: 1 day    Minutes of Exercise per Session: 10 min  Stress: Stress Concern Present (08/16/2022)   [employer] of Occupational Health - Occupational Stress Questionnaire    Feeling of Stress : To some extent  Social Connections: Moderately Isolated (08/16/2022)   Social Connection and Isolation Panel [NHANES]    Frequency of Communication with Friends and Family: More than three times a week    Frequency of Social Gatherings with Friends and Family: More than three times a week    Attends Religious Services: More than 4 times per year    Active Member of [employer] or Organizations: No    Attends Banker Meetings: Never    Marital Status: Widowed  Intimate Partner Violence: Not At Risk (08/16/2022)   Humiliation, Afraid, Rape, and Kick questionnaire    Fear of Current or Ex-Partner: No    Emotionally Abused: No    Physically Abused: No    Sexually Abused: No    Review of Systems: ROS negative except for what is noted on the assessment and plan.  Objective:   Vitals:   09/27/22 1458 09/27/22 1500  BP: (!) 154/57 (!) 150/60  Pulse: 72 67  Temp: 98 F (36.7 C)   TempSrc: Oral   SpO2: 98%   Weight: 123 lb 1.6 oz (55.8 kg)   Height: 5\' 7"  (1.702 m)     Physical Exam: Constitutional: well-appearing in no acute distress HENT: normocephalic atraumatic, mucous membranes moist Eyes: Corneal clouding of the left eye.   Neck: supple Cardiovascular: regular rate and rhythm, no m/r/g Pulmonary/Chest: normal work of breathing on room air, lungs clear to auscultation bilaterally Abdominal: Mild pain  with palpation of the lower abdomen.   MSK: normal bulk and tone Neurological: alert & oriented x 3, equal and symmetric movement for the upper and lower extremities, slow gait, no tremor appreciated Skin:  warm and dry Psych: Relatively normal mood and affect, able to follow conversation and answer questions appropriately.   Assessment & Plan:  Dysuria Patient endorses urinary frequency, urgency.  She was recently discharged from hospital on 08/10/2022 with a diagnosis of acute metabolic encephalopathy.  Where her hospital course was complicated by presumed UTI treated with IV antibiotics which later showed negative cultures.  Of note she has a history of HIV.  She denies fevers, chills, or pain with urination.  Point-of-care urine dipstick showed moderate blood, positive nitrates, 1+ leukocytes.  Plan: - Keflex 500 mg twice daily for 1 week. - Urine sent for culture in setting of HIV, recent UTI during hospitalization.  Visual hallucination Patient recently discharged from hospital on 08/10/2022 with a diagnosis of acute metabolic encephalopathy.  There has been significant concern for polypharmacy in this patient who takes Percocet, gabapentin, Xanax.  Patient seen today primarily for complaints of urinary urgency, frequency concerning for UTI.  However patient also endorses continued visual hallucinations.  I spoke with the patient's son as well as her sister and nephew who she lives with to attempt to obtain collateral data.  The patient endorses a 2 to 3-year history of visual hallucinations.  She says she sees people mostly, with common hallucinations such as "children climbing and jumping out of the couch".  She cannot always decide whether or not her hallucinations are real or fake.  She is blind in her left eye and her vision in her right eye is significantly impaired.  She says she sees the hallucinations "more clearly that I can see your face".  Patient and family endorse periods of confusion, however they do note that the patient's visual hallucinations are not confined to these periods of confusion.  Patient endorses occasional auditory hallucinations which she describes as "like a  conversation that she cannot quite make out".  Her family denies any inappropriate or disinhibited behavior.  They say her personality has remained similar for the last 3 years.  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  They state that her ataxia has not gotten any worse and has been stable during this time.  She is able to recall pertinent facts such as the medications she takes, physicians she is seen, and timelines regarding her medical history.   Assessment: It is possible that some of the patient's visual hallucinations may be related to her polypharmacy or visual impairment, however I would be more concerned for underlying etiology such as dementia with Lewy body given this patient's persistent visual hallucinations which are unrelated to her episodes of altered mental status and have not really been affected by attempts to reduce her pharmacological treatments.  Her ataxia and gait also increase suspicion for this disease.  Plan: Patient has follow-up with her eye doctors which I urged her to attend. Patient was seen today in clinic with Dr. Mayford Knife.  Geriatrics referral has been placed previously but patient was unable to schedule and attend.  Encourage patient to schedule with Dr. Mayford Knife in geriatrics for more in-depth workup of her visual hallucinations and complex medical needs.   Patient seen with Dr. Willow Ora MD [facility]  PGY-1 Pager: [phone]  Phone: [phone] Date 09/27/2022  Time 6:09 PM

## 2022-09-27 NOTE — Patient Instructions (Addendum)
Thank you, [name] for allowing [name] to provide your care today. Today we discussed UTI.    I have ordered the following labs for you:  Lab Orders         Culture, Urine         POCT Urinalysis Dipstick (95188)      Referrals ordered today:   Referral Orders         Ambulatory referral to Geriatrics      I have ordered the following medication/changed the following medications:   Stop the following medications: Medications Discontinued During This Encounter  Medication Reason   terazosin (HYTRIN) 5 MG capsule    cephALEXin (KEFLEX) 500 MG capsule      Start the following medications: Meds ordered this encounter  Medications   DISCONTD: cephALEXin (KEFLEX) 500 MG capsule    Sig: Take 1 capsule (500 mg total) by mouth 2 (two) times daily for 10 days.    Dispense:  14 capsule    Refill:  0   cephALEXin (KEFLEX) 500 MG capsule    Sig: Take 1 capsule (500 mg total) by mouth 2 (two) times daily for 7 days.    Dispense:  14 capsule    Refill:  0     Follow up: you are scheduled to see Dr. Criselda [name] 10/06/2022  We look forward to seeing you next time. Please call our clinic at [phone] if you have any questions or concerns. The best time to call is Monday-Friday from 9am-4pm, but there is someone available 24/7. If after hours or the weekend, call the main hospital number and ask for the Internal Medicine Resident On-Call. If you need medication refills, please notify your pharmacy one week in advance and they will send [name] a request.   Thank you for trusting me with your care. Wishing you the best!  Jessica Macadamia MD [facility]

## 2022-09-28 ENCOUNTER — Ambulatory Visit: Payer: Medicare Other

## 2022-09-28 MED ORDER — CEPHALEXIN 500 MG PO CAPS
500.0000 mg | ORAL_CAPSULE | Freq: Two times a day (BID) | ORAL | 0 refills | Status: DC
Start: 2022-09-28 — End: 2022-10-06

## 2022-09-29 LAB — URINE CULTURE

## 2022-09-30 LAB — URINE CULTURE

## 2022-10-02 ENCOUNTER — Telehealth: Payer: Self-pay | Admitting: Student

## 2022-10-02 DIAGNOSIS — R300 Dysuria: Secondary | ICD-10-CM

## 2022-10-02 MED ORDER — SULFAMETHOXAZOLE-TRIMETHOPRIM 800-160 MG PO TABS
1.0000 | ORAL_TABLET | Freq: Two times a day (BID) | ORAL | 0 refills | Status: DC
Start: 2022-10-02 — End: 2022-12-28

## 2022-10-02 NOTE — Telephone Encounter (Addendum)
I spoke with Jessica [name] on the phone. Patient's identity was confirmed using two patient specific identifiers. Patient told me she still has urgency and frequency and some lower abdominal pain. Case was discussed with Dr. Mikey Bussing. Based on her sensitives on her UA we will try a 5 day course of Bactrim. The patient is set to see [name] in clinic in 4 days where we can reevaluate. Anticipatory guidance given for when to seek additional help. If their symptoms do not resolve with bactrim, consider additional workup for polyuria such as BMP, urine studies, glucose, etc.

## 2022-10-04 NOTE — Progress Notes (Signed)
Internal Medicine Clinic Attending  I was physically present during the key portions of the resident provided service and participated in the medical decision making of patient's management care. I reviewed pertinent patient test results.  The assessment, diagnosis, and plan were formulated together and I agree with the documentation in the resident's note.  Williams, Julie Anne, MD  

## 2022-10-06 ENCOUNTER — Ambulatory Visit (INDEPENDENT_AMBULATORY_CARE_PROVIDER_SITE_OTHER): Payer: Medicare Other | Admitting: Internal Medicine

## 2022-10-06 ENCOUNTER — Other Ambulatory Visit: Payer: Self-pay

## 2022-10-06 ENCOUNTER — Encounter: Payer: Self-pay | Admitting: Internal Medicine

## 2022-10-06 VITALS — BP 124/48 | HR 51 | Temp 97.6°F | Ht 67.0 in | Wt 124.1 lb

## 2022-10-06 DIAGNOSIS — Z0000 Encounter for general adult medical examination without abnormal findings: Secondary | ICD-10-CM

## 2022-10-06 DIAGNOSIS — H547 Unspecified visual loss: Secondary | ICD-10-CM | POA: Insufficient documentation

## 2022-10-06 DIAGNOSIS — H5410 Blindness, one eye, low vision other eye, unspecified eyes: Secondary | ICD-10-CM | POA: Diagnosis not present

## 2022-10-06 DIAGNOSIS — M1612 Unilateral primary osteoarthritis, left hip: Secondary | ICD-10-CM

## 2022-10-06 DIAGNOSIS — M810 Age-related osteoporosis without current pathological fracture: Secondary | ICD-10-CM

## 2022-10-06 DIAGNOSIS — M25552 Pain in left hip: Secondary | ICD-10-CM | POA: Diagnosis not present

## 2022-10-06 DIAGNOSIS — Z79899 Other long term (current) drug therapy: Secondary | ICD-10-CM | POA: Diagnosis not present

## 2022-10-06 DIAGNOSIS — Z1231 Encounter for screening mammogram for malignant neoplasm of breast: Secondary | ICD-10-CM

## 2022-10-06 DIAGNOSIS — R300 Dysuria: Secondary | ICD-10-CM | POA: Diagnosis not present

## 2022-10-06 DIAGNOSIS — R441 Visual hallucinations: Secondary | ICD-10-CM

## 2022-10-06 MED ORDER — GABAPENTIN 300 MG PO CAPS
300.0000 mg | ORAL_CAPSULE | Freq: Three times a day (TID) | ORAL | 3 refills | Status: DC
Start: 2022-10-06 — End: 2023-09-24

## 2022-10-06 NOTE — Assessment & Plan Note (Signed)
Patient endorses recurrent left hip pain for the past week. Says that her steroid injectino from 09/05/22 helped to alleviate pain, but now she has it again. Describes pain coming down her left hip to her knee. Takes percocet to alleviate pain.   Will order DEXA scan today.

## 2022-10-06 NOTE — Assessment & Plan Note (Addendum)
Patient continues to endorse urgency and frequency with urination. She denies fevers, chills, pain, or burning with urination. She reports multiple instances where she cannot make it to the bathroom in time or must change in the middle of the night due to leakage. She took Kefalex and is currently taking bactrim and plans to finish the course of this antibiotic. She feels that her sxs of urgency have not changed since these medications.   Due to UTI treatment and continuation of sxs, likely urge incontinence. Will recommend timed voiding every 2 hours and follow up in 2 months.

## 2022-10-06 NOTE — Progress Notes (Unsigned)
This is a Psychologist, occupational Note.  The care of the patient was discussed with Dr. Criselda Peaches and the assessment and plan was formulated with their assistance.  Please see their note for official documentation of the patient encounter.   Subjective:   Patient ID: Jessica [name] female   DOB: 1940-07-31 82 y.o.   MRN: [id number]  HPI: [name] is a 82 y.o. 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 She is accompanied by her nephew Wylene Men, who assists her with her medication regimen and getting to the doctors.   For the details of today's visit, please refer to the assessment and plan.     Past Medical History:  Diagnosis Date   Acute blood loss anemia 08/08/2020   Acute renal failure (ARF) (HCC) 08/17/2017   Anxiety 04/05/2012   Bilateral sensorineural hearing loss 06/11/2014   Mild to moderate on the left side and slight to mild on the right side per audiometry 05/2014.  Hearing aides with possible masking of tinnitus recommended but patient wished to defer secondary to finances.   Blood transfusion without reported diagnosis    pt denies   Bursitis of right shoulder 07/12/2012   s/p shoulder injection 07/12/2012    Cataract of right eye    REMOVED RIGHT EYE 4-19    Constipation due to pain medication 04/27/2010   Diverticulosis 02/08/2012   Extensive left-sided diverticula on colonoscopy March 2012 per Dr. Jena Gauss    Essential hypertension 07/20/2006   Genital herpes 07/20/2006   Glaucoma of left eye 07/20/2006   Headache 10/20/2019   Heart murmur 1961   Human immunodeficiency virus disease (HCC) 03/27/1986   Hyperlipidemia LDL goal < 100 04/05/2012   Hypokalemia 04/12/2018   Insomnia 10/20/2019   Left hip pain 04/11/2021   Leg pain 04/11/2021   Long-term current use of opiate analgesic 03/17/2016   Loose stools  04/08/2019   Lumbar degenerative disc disease 07/20/2006   With chronic back pain    Marijuana use 07/03/2016   Micturition syncope 09/20/2015   Nausea and vomiting 04/08/2019   Opiate dependence (HCC) 04/12/2018   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   s/p aortobifem bypass 2009    Periumbilical hernia 05/18/2014   1 cm left periumbilical abdominal wall defect   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   DEXA 04/15/2012: L1-L4 spine T -3.9, Right femur T -3.0    Right rotator cuff tear 02/01/2013   Responds to periodic steroid injections   Seborrhea 09/01/2010   Shoulder pain, right 12/04/2017   Small bowel obstruction due to adhesions (HCC) 02/08/2012   s/p Exploratory laparotomy, lysis of adhesions 02/12/12     Subjective tinnitus of both ears 05/18/2014   Tobacco abuse 02/19/2012   Tobacco abuse    Vasovagal syncope 02/15/2015   Vitamin D deficiency 05/29/2018   Vitamin D 18.96 (04/30/2018), treated with ergocalciferol 50,000 units PO QWk X 4 weeks   Voiding dysfunction    s/p cystoscopy and meatal dilation Dec 2005   Current Outpatient Medications  Medication Sig Dispense Refill   acetaminophen (TYLENOL) 325 MG tablet Take 2 tablets (650 mg total) by mouth every 6 (six) hours as needed for mild pain or fever (or Fever >/= 101). 100 tablet 2   acyclovir (ZOVIRAX) 400 MG tablet TAKE 1 TABLET BY MOUTH THREE TIMES DAILY AS NEEDED FOR  OUTBREAKS FOR SEVEN DAYS (Patient taking differently: Take 400 mg by mouth 3 (three) times daily as needed. For severe outbreaks for 7 days) 21 tablet 5   alendronate (FOSAMAX) 70 MG tablet Take 1 tablet (70 mg total) by mouth every Sunday. TAKE 1 TABLET BY MOUTH ONCE WEEKLY ON SUNDAY take with a full glass of water on an empty stomach Strength: 70 mg 12 tablet 3   ALPRAZolam (XANAX) 0.5 MG tablet TAKE 2 TO 3 TABLETS BY MOUTH AT BEDTIME AS NEEDED FOR anxiety/sleep (Patient not taking: Reported on 09/27/2022) 90 tablet 1   amLODipine (NORVASC) 10 MG tablet Take  1 tablet (10 mg total) by mouth daily. 90 tablet 3   ammonium lactate (AMLACTIN) 12 % cream Apply topically.     aspirin EC 81 MG tablet Take 1 tablet (81 mg total) by mouth daily with breakfast. 30 tablet 5   atorvastatin (LIPITOR) 10 MG tablet Take 1 tablet (10 mg total) by mouth at bedtime. 90 tablet 3   benazepril (LOTENSIN) 40 MG tablet Take 1 tablet (40 mg total) by mouth daily. 90 tablet 3   bictegravir-emtricitabine-tenofovir AF (BIKTARVY) 50-200-25 MG TABS tablet Take 1 tablet by mouth daily. 30 tablet 11   clopidogrel (PLAVIX) 75 MG tablet Take 1 tablet (75 mg total) by mouth daily. 90 tablet 3   diclofenac Sodium (VOLTAREN) 1 % GEL APPLY TWO GRAMS TO AFFECTED AREA(S) FOUR TIMES DAILY (Patient taking differently: Apply 2 g topically 4 (four) times daily.) 350 g 2   dorzolamide-timolol (COSOPT) 2-0.5 % ophthalmic solution Place 1 drop into the right eye 2 (two) times daily.     gabapentin (NEURONTIN) 300 MG capsule Take 1 capsule (300 mg total) by mouth 3 (three) times daily. 270 capsule 3   melatonin 5 MG TABS Take 1 tablet (5 mg total) by mouth at bedtime as needed. (Patient not taking: Reported on 09/27/2022) 10 tablet 0   Multiple Vitamin (MULTIVITAMIN WITH MINERALS) TABS Take 1 tablet by mouth daily. (Patient not taking: Reported on 09/27/2022)     oxyCODONE-acetaminophen (PERCOCET/ROXICET) 5-325 MG tablet Take 1 tablet by mouth every 12 (twelve) hours as needed for severe pain. 60 tablet 0   phenylephrine (NEO-SYNEPHRINE) 1 % nasal spray Place 1 drop into both nostrils every 6 (six) hours as needed for congestion. (Patient not taking: Reported on 09/27/2022)     polyethylene glycol (MIRALAX / GLYCOLAX) 17 g packet Take 17 g by mouth See admin instructions. Take 1 dose twice daily for 1 week and then 1 dose daily after that indefinitely 45 each 2   psyllium (REGULOID) 0.52 g capsule Take 0.52 g by mouth daily.     sulfamethoxazole-trimethoprim (BACTRIM DS) 800-160 MG tablet Take 1 tablet  by mouth 2 (two) times daily. 10 tablet 0   triamcinolone ointment (KENALOG) 0.1 % APPLY TO THE AFFECTED AREA(S) TWICE DAILY (Patient taking differently: Apply 1 application  topically 2 (two) times daily as needed (rash).) 454 g 3   No current facility-administered medications for this visit.    Review of Systems: Pertinent items are noted in HPI. Objective:  Physical Exam: Vitals:   10/06/22 0844 10/06/22 0846  BP:  (!) 124/48  Pulse:  (!) 51  Temp:  97.6 F (36.4 C)  TempSrc:  Oral  SpO2:  100%  Weight: 124 lb 1.6 oz (56.3 kg)   Height: 5\' 7"  (1.702 m)     Constitutional: NAD, appears comfortable Cardiovascular: RRR, 2/6 systolic murmur over left sternal border.  Pulmonary/Chest: CTAB, I heard normal lung sounds.   Extremities: No cog-wheeling appreciated in upper extremities. No visible tremor.  Eye: Left eye corneal clouding Psychiatric: Normal mood and affect  Assessment & Plan:   Blindness Patient has progressively worsening vision. She reports residual vision in the right eye where she can still see shapes and figures but cannot make out details. She has not vision in left eye. There is visible clouding of cornea of left eye. She reports difficulty doing daily talks due to this vision impairment, and frequently has difficulty making it to the bathroom in time. She also reports increased onset of visual hallucinations since May 2024; she describes these as present when her eye is open. She usually seems them in her house or daughter's house. The images include either people or boxes blocker her way. She is not able to control when they appear or disappear. She has never been spoken to by them. Says they obey her. When she is asleep, she does not feel disturbed by them. Has not noted them when her eyes are shut.   She also endorses having more frequent falls, 4 in the last 3 weeks. She reports falling in the bathtub and hitting her head several months ago. She has reported  falling when in a rush to go to the bathroom, or when her visual hallucinations cause her to misplace where things are in the bathroom like the toilet. This is likely also attributable to her decreased vision.   Visual hallucinations could be a result of gradually worsening vision impairment, visual release hallucinations. Will refer to have services for the blind to aid in home and assist with continuation of independent living. Contact information for social worker relayed to patient for [facility].   Osteoarthritis of left hip Patient endorses recurrent left hip pain for the past week. Says that her steroid injectino from 09/05/22 helped to alleviate pain, but now she has it again. Describes pain coming down her left hip to her knee. Takes percocet to alleviate pain.   Will order DEXA scan today.   Healthcare maintenance Will order MMG for repeat.   Polypharmacy Patient describes visual hallucinations and was recently hospitalized for acute metabolic encephalopathy. started in may of this year, sometimes sees kids jumping out of rocking chair, had a urinary accident two days ago when saw boxes blocking her bathroom, could not feel them. Says cannot control when they appear or disappear. Has never been spoken to by them. Says they obey her. When she is asleep, does not feel disturbed by them. Has not noted them when her eyes are shut. Usually only sees them in her house or daughters house. Is currently taking Xanax 2x/night, percocet 3x/day, and gabapentin 6x/day.   Will decrease gabapentin to 300 mg 3x/day to address visual hallucinations possibly secondary to polypharmacy.   Dysuria Patient continues to endorse urgency and frequency with urination. She denies fevers, chills, pain, or burning with urination. She reports multiple instances where she cannot make it to the bathroom in time or must change in the middle of the night due to leakage. She took Kefalex and is currently taking  bactrim and plans to finish the course of this antibiotic. She feels that her sxs of urgency have not changed since these medications.   Due to UTI treatment and continuation of sxs, likely urge incontinence. Will recommend timed voiding every 2 hours and follow up in 2 months.   Visual hallucination She also reports increased onset of visual  hallucinations since May 2024; she describes these as present when her eye is open. She usually seems them in her house or daughter's house. The images include either people or boxes blocker her way. She is not able to control when they appear or disappear. She has never been spoken to by them. Says they obey her. When she is asleep, she does not feel disturbed by them. Has not noted them when her eyes are shut.   She also endorses having more frequent falls, 4 in the last 3 weeks. She reports falling in the bathtub and hitting her head several months ago. She has reported falling when in a rush to go to the bathroom, or when her visual hallucinations cause her to misplace where things are in the bathroom like the toilet. This is likely also attributable to her decreased vision.   Visual hallucinations could be a result of gradually worsening vision impairment, visual release hallucinations. Will refer to have services for the blind to aid in home and assist with continuation of independent living. Contact information for social worker relayed to patient for [facility].

## 2022-10-06 NOTE — Assessment & Plan Note (Signed)
Will order MMG for repeat.

## 2022-10-06 NOTE — Assessment & Plan Note (Addendum)
Patient has progressively worsening vision. She reports residual vision in the right eye where she can still see shapes and figures but cannot make out details. She has not vision in left eye. There is visible clouding of cornea of left eye. She reports difficulty doing daily talks due to this vision impairment, and frequently has difficulty making it to the bathroom in time. She also reports increased onset of visual hallucinations since May 2024; she describes these as present when her eye is open. She usually seems them in her house or daughter's house. The images include either people or boxes blocker her way. She is not able to control when they appear or disappear. She has never been spoken to by them. Says they obey her. When she is asleep, she does not feel disturbed by them. Has not noted them when her eyes are shut.   She also endorses having more frequent falls, 4 in the last 3 weeks. She reports falling in the bathtub and hitting her head several months ago. She has reported falling when in a rush to go to the bathroom, or when her visual hallucinations cause her to misplace where things are in the bathroom like the toilet. This is likely also attributable to her decreased vision.   Visual hallucinations could be a result of gradually worsening vision impairment, visual release hallucinations. Will refer to have services for the blind to aid in home and assist with continuation of independent living. Contact information for social worker relayed to patient for [facility].

## 2022-10-06 NOTE — Assessment & Plan Note (Addendum)
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 She usually seems them in her house or daughter's house. The images include either people or boxes blocker her way. She is not able to control when they appear or disappear. She has never been spoken to by them. Says they obey her. When she is asleep, she does not feel disturbed by them. Has not noted them when her eyes are shut.   She also endorses having more frequent falls, 4 in the last 3 weeks. She reports falling in the bathtub and hitting her head several months ago. She has reported falling when in a rush to go to the bathroom, or when her visual hallucinations cause her to misplace where things are in the bathroom like the toilet. This is likely also attributable to her decreased vision.   Visual hallucinations could be a result of gradually worsening vision impairment, visual release hallucinations. Will refer to have services for the blind to aid in home and assist with continuation of independent living. Contact information for social worker relayed to patient for [facility].    >>ASSESSMENT AND PLAN FOR POLYPHARMACY WRITTEN ON 10/06/2022 11:13 AM BY Aveion Nguyen, MEDICAL STUDENT  Patient describes visual hallucinations and was recently hospitalized for acute metabolic encephalopathy. started in may of this year, sometimes sees kids jumping out of rocking chair, had a urinary accident two days ago when saw boxes blocking her bathroom, could not feel them. Says cannot control when they appear or disappear. Has never been spoken to by them. Says they obey her. When she is asleep, does not feel disturbed by them. Has not noted them when her eyes are shut. Usually only sees them in her house or daughters house.  Is currently taking Xanax 2x/night, percocet 3x/day, and gabapentin 6x/day.   Will decrease gabapentin to 300 mg 3x/day to address visual hallucinations possibly secondary to polypharmacy.

## 2022-10-06 NOTE — Assessment & Plan Note (Signed)
Patient describes visual hallucinations and was recently hospitalized for acute metabolic encephalopathy. started in may of this year, sometimes sees kids jumping out of rocking chair, had a urinary accident two days ago when saw boxes blocking her bathroom, could not feel them. Says cannot control when they appear or disappear. Has never been spoken to by them. Says they obey her. When she is asleep, does not feel disturbed by them. Has not noted them when her eyes are shut. Usually only sees them in her house or daughters house. Is currently taking Xanax 2x/night, percocet 3x/day, and gabapentin 6x/day.   Will decrease gabapentin to 300 mg 3x/day to address visual hallucinations possibly secondary to polypharmacy.

## 2022-10-06 NOTE — Patient Instructions (Addendum)
Social Work assistance for people with blindness - [place] [employer]  [employer]  Phone: [phone], ext. [phone] Fax: [phone] Email: [email or web address] [address] [place] [address] [address] [place], [address] [address] [place], [place] [address]  Can ask about help with medicaid application and assistance in the home.   Timed voiding: Make sure to urinate first thing in the morning and then every 2 hours, whether you feel like going or not.    Please DECREASE your gabapentin to 300mg  three times a day (1 capsule).   Please schedule your mammogram and DEXA scan.

## 2022-10-09 ENCOUNTER — Other Ambulatory Visit: Payer: Self-pay | Admitting: Internal Medicine

## 2022-10-09 DIAGNOSIS — B029 Zoster without complications: Secondary | ICD-10-CM

## 2022-10-10 NOTE — Progress Notes (Signed)
Attestation for Student Documentation:  I personally was present and re-performed the history, physical exam and medical decision-making activities of this service and have verified that the service and findings are accurately documented in the student's note.  We mainly addressed polypharmacy and visual hallucinations today.  She doesn't have any other signs/symptoms of a psychotic disorder. She does have progressive vision loss, so highest on my differential would hallucinations related to her vision loss.  Will plan to decrease gabapentin and see if this helps.  We also strongly recommended her to see her eye doctor.  Referral to CSW for help with blindness.   For health maintenance, we have asked her to schedule a mammogram and dexa scan.   Inez Catalina, MD 10/10/2022, 8:35 AM

## 2022-10-11 ENCOUNTER — Telehealth: Payer: Self-pay

## 2022-10-11 NOTE — Telephone Encounter (Signed)
RTC to patient restarted her 2 Gabapentin dosing as the 1 pill did not work.  States is feeling better since starting back on the 2 tablets.

## 2022-10-11 NOTE — Telephone Encounter (Signed)
Pt is requesting a call back ... She stated that at her last visit with PCP  her (gabapentin (NEURONTIN) 300 MG capsule ) was dropped down to take 1 tab  instead of 2 and  her  pain  started to hurt more and being that she had to go back to  work   so she went back up to taking 2 tabs instead of the 1 as directed .Marland Kitchen Pt has been taking 2 tabs  3 days after starting the 1 tab

## 2022-10-11 NOTE — Telephone Encounter (Signed)
Okay.  That is fine.  Thanks for letting me know.

## 2022-10-19 ENCOUNTER — Other Ambulatory Visit: Payer: Self-pay | Admitting: Internal Medicine

## 2022-10-19 NOTE — Telephone Encounter (Signed)
Last rx written - 09/22/22. Last OV - 10/06/22 with PCP. Next OV - 11/14/22 with Dr Mayford Knife. UDS - 05/29/18.

## 2022-10-26 ENCOUNTER — Telehealth: Payer: Self-pay

## 2022-10-26 NOTE — Telephone Encounter (Signed)
Patients daughter called she stated the patient is requesting a rx refill for cephalexin to be sent to [facility].

## 2022-10-30 NOTE — Telephone Encounter (Signed)
I called pt's daughter back and I talked to the pt. Pt stated with last 2 UTI's she had hallucinations. Pt stated she had episodes of hallucination when her daughter called last week; stated she's unsure if this is related or not. But none now. Pt stated she's having urinary frequency and urgency. Pt's daughter want to know if her mother could have a Cephalexin rx on hand.

## 2022-10-30 NOTE — Telephone Encounter (Signed)
I would not recommend that.  If she has recurrent urinary symptoms, would bring her in to be seen.  She may need Urology referral for frequent UTIs.

## 2022-10-30 NOTE — Telephone Encounter (Signed)
What is the cephalexin for and what is the need for refill?

## 2022-10-31 ENCOUNTER — Other Ambulatory Visit: Payer: Self-pay | Admitting: *Deleted

## 2022-10-31 DIAGNOSIS — R21 Rash and other nonspecific skin eruption: Secondary | ICD-10-CM

## 2022-10-31 MED ORDER — DICLOFENAC SODIUM 1 % EX GEL: 2.0000 g | Freq: Four times a day (QID) | CUTANEOUS | 2 refills | Status: AC

## 2022-10-31 MED ORDER — TRIAMCINOLONE ACETONIDE 0.1 % EX OINT: TOPICAL_OINTMENT | Freq: Two times a day (BID) | CUTANEOUS | 3 refills | Status: DC

## 2022-10-31 NOTE — Telephone Encounter (Signed)
Pt's daughter and pt informed of Dr Donnetta Hutching response. Stated they understand to call the office to schedule an appt if pt continues to experience UTI symptoms.

## 2022-10-31 NOTE — Telephone Encounter (Signed)
Pt stated she needs meds refilled esp when the weather starts to change (cooler).

## 2022-11-08 DIAGNOSIS — Z947 Corneal transplant status: Secondary | ICD-10-CM | POA: Diagnosis not present

## 2022-11-08 DIAGNOSIS — Z961 Presence of intraocular lens: Secondary | ICD-10-CM | POA: Diagnosis not present

## 2022-11-10 ENCOUNTER — Other Ambulatory Visit: Payer: Self-pay | Admitting: Internal Medicine

## 2022-11-10 DIAGNOSIS — F411 Generalized anxiety disorder: Secondary | ICD-10-CM

## 2022-11-14 ENCOUNTER — Ambulatory Visit (INDEPENDENT_AMBULATORY_CARE_PROVIDER_SITE_OTHER): Payer: Medicare Other | Admitting: Internal Medicine

## 2022-11-14 VITALS — BP 180/70 | HR 55 | Temp 97.7°F | Ht 67.0 in | Wt 128.1 lb

## 2022-11-14 DIAGNOSIS — G4700 Insomnia, unspecified: Secondary | ICD-10-CM | POA: Diagnosis not present

## 2022-11-14 DIAGNOSIS — H5316 Psychophysical visual disturbances: Secondary | ICD-10-CM

## 2022-11-14 DIAGNOSIS — R351 Nocturia: Secondary | ICD-10-CM | POA: Diagnosis not present

## 2022-11-14 NOTE — Patient Instructions (Addendum)
[name],  Thank you so much for coming in for today's visit!  It was a pleasure.  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  I encourage you to look it up online.  There are several sites that have helpful tips and tricks for living with this condition which can be annoying, but which is not dangerous.  Please also check into Services for the Blind!  We briefly touched on eliminating caffeine [facility]) at night to see if this will help with her frequent trips to the bathroom.  Do not feel badly about using her be your bedside commode-if it helps prevent you from getting lost in the home, if it is worth it!  Finally, revisited about potential risks of ongoing alprazolam and opioid use.  I will leave these decisions up to you and Dr. Criselda Peaches, but I encourage you to consider a gradual taper.  Take care and stay well!  Dr. Mayford Knife
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
## 2022-11-15 ENCOUNTER — Encounter: Payer: Self-pay | Admitting: Internal Medicine

## 2022-11-15 DIAGNOSIS — R351 Nocturia: Secondary | ICD-10-CM | POA: Insufficient documentation

## 2022-11-15 NOTE — Assessment & Plan Note (Signed)
See office note 11/14/22

## 2022-11-16 ENCOUNTER — Other Ambulatory Visit: Payer: Self-pay | Admitting: Internal Medicine

## 2022-12-04 ENCOUNTER — Other Ambulatory Visit: Payer: Self-pay

## 2022-12-04 ENCOUNTER — Ambulatory Visit: Payer: Medicare Other | Admitting: Infectious Disease

## 2022-12-04 ENCOUNTER — Encounter: Payer: Self-pay | Admitting: Infectious Disease

## 2022-12-04 VITALS — BP 179/79 | HR 65 | Resp 17 | Ht 67.0 in | Wt 124.0 lb

## 2022-12-04 DIAGNOSIS — K1379 Other lesions of oral mucosa: Secondary | ICD-10-CM

## 2022-12-04 DIAGNOSIS — E785 Hyperlipidemia, unspecified: Secondary | ICD-10-CM

## 2022-12-04 DIAGNOSIS — H5410 Blindness, one eye, low vision other eye, unspecified eyes: Secondary | ICD-10-CM

## 2022-12-04 DIAGNOSIS — F22 Delusional disorders: Secondary | ICD-10-CM

## 2022-12-04 DIAGNOSIS — Z23 Encounter for immunization: Secondary | ICD-10-CM | POA: Diagnosis not present

## 2022-12-04 DIAGNOSIS — R441 Visual hallucinations: Secondary | ICD-10-CM | POA: Diagnosis not present

## 2022-12-04 DIAGNOSIS — Z79899 Other long term (current) drug therapy: Secondary | ICD-10-CM | POA: Diagnosis not present

## 2022-12-04 DIAGNOSIS — L299 Pruritus, unspecified: Secondary | ICD-10-CM | POA: Diagnosis not present

## 2022-12-04 DIAGNOSIS — B20 Human immunodeficiency virus [HIV] disease: Secondary | ICD-10-CM

## 2022-12-04 HISTORY — DX: Visual hallucinations: R44.1

## 2022-12-04 LAB — CBC WITH DIFFERENTIAL/PLATELET
Absolute Monocytes: 454 cells/uL (ref 200–950)
Basophils Absolute: 70 cells/uL (ref 0–200)
Basophils Relative: 1.1 %
Eosinophils Absolute: 51 cells/uL (ref 15–500)
Eosinophils Relative: 0.8 %
HCT: 32.9 % — ABNORMAL LOW (ref 35.0–45.0)
Hemoglobin: 10.8 g/dL — ABNORMAL LOW (ref 11.7–15.5)
Lymphs Abs: 3853 cells/uL (ref 850–3900)
MCH: 29.9 pg (ref 27.0–33.0)
MCHC: 32.8 g/dL (ref 32.0–36.0)
MCV: 91.1 fL (ref 80.0–100.0)
MPV: 9.2 fL (ref 7.5–12.5)
Monocytes Relative: 7.1 %
Neutro Abs: 1971 cells/uL (ref 1500–7800)
Neutrophils Relative %: 30.8 %
Platelets: 341 10*3/uL (ref 140–400)
RBC: 3.61 10*6/uL — ABNORMAL LOW (ref 3.80–5.10)
RDW: 12.9 % (ref 11.0–15.0)
Total Lymphocyte: 60.2 %
WBC: 6.4 10*3/uL (ref 3.8–10.8)

## 2022-12-04 MED ORDER — BIKTARVY 50-200-25 MG PO TABS: 1.0000 | ORAL_TABLET | Freq: Every day | ORAL | 11 refills | Status: DC

## 2022-12-04 NOTE — Progress Notes (Signed)
Subjective:  Chief complaint: lesion on her hard palate and sensation of "something in her scalp that itches and which she is pulling out and which has tentacles like an octopus"   Patient ID: Jessica [name], female    DOB: 1940-09-15, 82 y.o.   MRN: [id number]  HPI  Jessica [name] is a 82y.o. female with HIV infection who has done superbly well on her antiviral regimen, changed from Atripla to [facility] and DESCOVY -->BIKTARVY with reasonable virological suppression.  He has comorbid smoking coronary artery disease peripheral vascular disease hyperlipidemia hypertension.   Mekala was recently seen by Dr. Mayford Knife due to concerns about visual hallucinations which she had felt were due to Hanna's loss of her vision.  She DID however, today in our clinic complain of being infected with "some type of parasite" in her scalp.  She experiences intense itching throughout her scalp and then they become particular areas that she is becomes concerned about that she says that she then begins to pull "something out of them" that "is like the tentacles of an Octopus"  She also has a lesion on her palate that has been there for 1-2 months.    Past Medical History:  Diagnosis Date   Acute blood loss anemia 08/08/2020   Acute renal failure (ARF) (HCC) 08/17/2017   Anxiety 04/05/2012   Bilateral sensorineural hearing loss 06/11/2014   Mild to moderate on the left side and slight to mild on the right side per audiometry 05/2014.  Hearing aides with possible masking of tinnitus recommended but patient wished to defer secondary to finances.   Blood transfusion without reported diagnosis    pt denies   Bursitis of right shoulder 07/12/2012   s/p shoulder injection 07/12/2012    Cataract of right eye    REMOVED RIGHT EYE 4-19    Constipation due to pain medication 04/27/2010   Diverticulosis 02/08/2012   Extensive left-sided diverticula on colonoscopy March 2012 per Dr. Jena Gauss     Essential hypertension 07/20/2006   Genital herpes 07/20/2006   Glaucoma of left eye 07/20/2006   Headache 10/20/2019   Heart murmur 1961   Human immunodeficiency virus disease (HCC) 03/27/1986   Hyperlipidemia LDL goal < 100 04/05/2012   Hypokalemia 04/12/2018   Insomnia 10/20/2019   Left hip pain 04/11/2021   Leg pain 04/11/2021   Long-term current use of opiate analgesic 03/17/2016   Loose stools 04/08/2019   Lumbar degenerative disc disease 07/20/2006   With chronic back pain    Marijuana use 07/03/2016   Micturition syncope 09/20/2015   Nausea and vomiting 04/08/2019   Opiate dependence (HCC) 04/12/2018   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   s/p aortobifem bypass 2009    Periumbilical hernia 05/18/2014   1 cm left periumbilical abdominal wall defect   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   DEXA 04/15/2012: L1-L4 spine T -3.9, Right femur T -3.0    Right rotator cuff tear 02/01/2013   Responds to periodic steroid injections   Seborrhea 09/01/2010   Shoulder pain, right 12/04/2017   Small bowel obstruction due to adhesions (HCC) 02/08/2012   s/p Exploratory laparotomy, lysis of adhesions 02/12/12     Subjective tinnitus of both ears 05/18/2014   Tobacco abuse 02/19/2012   Tobacco abuse    Vasovagal syncope 02/15/2015   Visual hallucination 12/04/2022   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 Jamey Ripa; "took out 12 inches of small intestiines and removed blockage"   COLONOSCOPY  2012   EYE SURGERY      EYE SURGERY  06/29/2020   06-29-2020- RIGHT CATARACT REMOVED AND LEFT EYE SURGERY TO REMOVE SAND LIKE SUBSTANCE    FEMORAL ARTERY EXPLORATION Left 07/10/2020   Procedure: REDO LEFT FEMORAL ARTERY EXPOSURE;  Surgeon: Nada Libman, MD;  Location: [facility] OR;  Service: Vascular;  Laterality: Left;   LAPAROTOMY  02/12/2012   Procedure: EXPLORATORY LAPAROTOMY;  Surgeon: Almond Lint, MD;  Location: [facility] OR;  Service: General;  Laterality: N/A;  Exploratory Laparotomy, lysis of adhesions   SMALL INTESTINE SURGERY     THROMBECTOMY FEMORAL ARTERY Left 07/10/2020   Procedure: THROMBECTOMY AORTA-BIFEMORAL GRAFT, PROFUNDA, AND SUPERFICIAL FEMORAL ARTERY  LEFT LEG;  Surgeon: Nada Libman, MD;  Location: [facility] OR;  Service: Vascular;  Laterality: Left;    Family History  Problem Relation Age of Onset   Kidney failure Mother    Diabetes Mother    Hypertension Mother    Heart disease Mother    Glaucoma Father    Congestive Heart Failure Sister    Diabetes Sister    Kidney disease Sister    Diabetes Brother    Unexplained death Brother 2       Automobile accident   Hypothyroidism Daughter    Arthritis Daughter        Neck/Back   Healthy Son    HIV/AIDS Brother    HIV Daughter    Kidney disease Daughter    Arthritis Son        Knee   Colon cancer Neg Hx    Colon polyps Neg Hx    Esophageal cancer Neg Hx    Rectal cancer Neg Hx    Stomach cancer Neg Hx       Social History   Socioeconomic History   Marital status: Widowed    Spouse name: Not on file   Number of children: 4   Years of education: 2y college   Highest education level: Not on file  Occupational History   Occupation: retired    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   Tobacco Use   Smoking status: Every Day    Current packs/day: 0.30    Average packs/day: 0.3 packs/day for 50.0 years (15.0 ttl pk-yrs)    Types: Cigarettes    Passive exposure: Current   Smokeless tobacco: Never   Tobacco comments:     5 cigs per day  Vaping Use   Vaping status: Never Used  Substance and Sexual Activity   Alcohol use: No    Alcohol/week: 0.0 standard drinks of alcohol    Comment: "last drink of alcohol ~ 1977"   Drug use: No   Sexual activity: Never  Other Topics Concern   Not on file  Social History Narrative   Lives alone in [place], [place]   Social Determinants of Health   Financial Resource Strain: Low Risk  (08/16/2022)   Overall Financial Resource Strain (CARDIA)    Difficulty of Paying  Living Expenses: Not hard at all  Food Insecurity: No Food Insecurity (08/16/2022)   Hunger Vital Sign    Worried About Running Out of Food in the Last Year: Never true    Ran Out of Food in the Last Year: Never true  Transportation Needs: No Transportation Needs (08/16/2022)   PRAPARE - Administrator, Civil Service (Medical): No    Lack of Transportation (Non-Medical): No  Physical Activity: Insufficiently Active (08/16/2022)   Exercise Vital Sign    Days of Exercise per Week: 1 day    Minutes of Exercise per Session: 10 min  Stress: Stress Concern Present (08/16/2022)   [employer] of Occupational Health - Occupational Stress Questionnaire    Feeling of Stress : To some extent  Social Connections: Moderately Isolated (08/16/2022)   Social Connection and Isolation Panel [NHANES]    Frequency of Communication with Friends and Family: More than three times a week    Frequency of Social Gatherings with Friends and Family: More than three times a week    Attends Religious Services: More than 4 times per year    Active Member of [employer] or Organizations: No    Attends Banker Meetings: Never    Marital Status: Widowed    Allergies  Allergen Reactions   Hctz [Hydrochlorothiazide] Other (See Comments)    Dizziness, syncope; does NOT wish to take anymore     Current Outpatient Medications:    acyclovir (ZOVIRAX) 400 MG tablet, TAKE 1 TABLET BY MOUTH THREE TIMES DAILY AS NEEDED FOR  OUTBREAKS FOR 7 DAYS, Disp: 21 tablet, Rfl: 5   alendronate (FOSAMAX) 70 MG tablet, Take 1 tablet (70 mg total) by mouth every Sunday. TAKE 1 TABLET BY MOUTH ONCE WEEKLY ON SUNDAY take with a full glass of water on an empty stomach Strength: 70 mg, Disp: 12 tablet, Rfl: 3   ALPRAZolam (XANAX) 0.5 MG tablet, TAKE 2 TO 3 TABLETS BY MOUTH AT BEDTIME AS NEEDED FOR anxiety/sleep, Disp: 90 tablet, Rfl: 1   amLODipine (NORVASC) 10 MG tablet, Take 1 tablet (10 mg total) by mouth daily., Disp: 90 tablet, Rfl: 3   ammonium lactate (AMLACTIN) 12 % cream, Apply topically., Disp: , Rfl:    aspirin EC 81 MG tablet, Take 1 tablet (81 mg total) by mouth daily with breakfast., Disp: 30 tablet, Rfl: 5   atorvastatin (LIPITOR) 10 MG tablet, Take 1 tablet (10 mg total) by mouth at bedtime., Disp: 90 tablet, Rfl: 3   benazepril (LOTENSIN) 40 MG tablet, Take 1 tablet (40 mg total) by mouth daily., Disp: 90 tablet, Rfl: 3   bictegravir-emtricitabine-tenofovir AF (BIKTARVY) 50-200-25 MG TABS tablet, Take 1 tablet by mouth daily., Disp: 30 tablet, Rfl: 11   clopidogrel (PLAVIX) 75 MG tablet, Take 1 tablet (75 mg total) by mouth daily., Disp: 90 tablet, Rfl: 3   diclofenac Sodium (VOLTAREN) 1 % GEL, Apply 2 g topically 4 (four) times daily. APPLY TWO GRAMS TO AFFECTED AREA(S) FOUR TIMES DAILY Strength: 1 %, Disp: 350 g, Rfl: 2   dorzolamide-timolol (COSOPT) 2-0.5 % ophthalmic solution, Place 1 drop into the right eye 2 (two) times daily., Disp: , Rfl:    gabapentin (NEURONTIN) 300 MG capsule, Take 1 capsule (300 mg total) by mouth 3 (three) times daily., Disp: 270 capsule, Rfl: 3   oxyCODONE-acetaminophen (PERCOCET/ROXICET) 5-325 MG tablet, Take 1 tablet by mouth every 12 (twelve) hours as needed for severe pain., Disp: 60 tablet, Rfl: 0   polyethylene glycol (MIRALAX /  GLYCOLAX) 17 g packet, Take 17 g by mouth See admin instructions. Take 1 dose twice daily for 1 week and then 1 dose daily after that indefinitely, Disp: 45  each, Rfl: 2   psyllium (REGULOID) 0.52 g capsule, Take 0.52 g by mouth daily., Disp: , Rfl:    sulfamethoxazole-trimethoprim (BACTRIM DS) 800-160 MG tablet, Take 1 tablet by mouth 2 (two) times daily., Disp: 10 tablet, Rfl: 0   terazosin (HYTRIN) 5 MG capsule, Take 5 mg by mouth at bedtime., Disp: , Rfl:    triamcinolone ointment (KENALOG) 0.1 %, Apply topically 2 (two) times daily. to affected area(s), Disp: 454 g, Rfl: 3   melatonin 5 MG TABS, Take 1 tablet (5 mg total) by mouth at bedtime as needed. (Patient not taking: Reported on 09/27/2022), Disp: 10 tablet, Rfl: 0   Multiple Vitamin (MULTIVITAMIN WITH MINERALS) TABS, Take 1 tablet by mouth daily. (Patient not taking: Reported on 09/27/2022), Disp: , Rfl:    phenylephrine (NEO-SYNEPHRINE) 1 % nasal spray, Place 1 drop into both nostrils every 6 (six) hours as needed for congestion. (Patient not taking: Reported on 09/27/2022), Disp: , Rfl:     Review of Systems  Constitutional:  Negative for activity change, appetite change, chills, diaphoresis, fatigue, fever and unexpected weight change.  HENT:  Negative for congestion, rhinorrhea, sinus pressure, sneezing, sore throat and trouble swallowing.   Respiratory:  Negative for cough, chest tightness, shortness of breath, wheezing and stridor.   Cardiovascular:  Negative for chest pain, palpitations and leg swelling.  Gastrointestinal:  Negative for abdominal distention, abdominal pain, anal bleeding, blood in stool, constipation, diarrhea, nausea and vomiting.  Genitourinary:  Negative for difficulty urinating, dysuria, flank pain and hematuria.  Musculoskeletal:  Negative for arthralgias, back pain, gait problem, joint swelling and myalgias.  Skin:  Negative for color change, pallor, rash and wound.  Neurological:  Negative for dizziness, tremors, weakness and light-headedness.  Hematological:  Negative for adenopathy. Does not bruise/bleed easily.  Psychiatric/Behavioral:  Positive for  dysphoric mood and hallucinations. Negative for agitation, behavioral problems, confusion, decreased concentration and sleep disturbance. The patient is nervous/anxious.        Objective:   Physical Exam Constitutional:      General: She is not in acute distress.    Appearance: Normal appearance. She is well-developed. She is not ill-appearing or diaphoretic.  HENT:     Head: Normocephalic.     Comments: Much of her hair is missing now in areas she has been trying to "pull out the parasites"    Right Ear: Hearing and external ear normal.     Left Ear: Hearing and external ear normal.     Nose: No nasal deformity or rhinorrhea.  Eyes:     General: No scleral icterus.    Conjunctiva/sclera: Conjunctivae normal.     Right eye: Right conjunctiva is not injected.     Left eye: Left conjunctiva is not injected.     Pupils: Pupils are equal, round, and reactive to light.  Neck:     Vascular: No JVD.  Cardiovascular:     Rate and Rhythm: Normal rate and regular rhythm.     Heart sounds: S1 normal and S2 normal.  Pulmonary:     Effort: Pulmonary effort is normal. No respiratory distress.     Breath sounds: No wheezing.  Abdominal:     General: There is no distension.  Musculoskeletal:        General: Normal range of motion.  Right shoulder: Normal.     Left shoulder: Normal.     Cervical back: Normal range of motion and neck supple.     Right hip: Normal.     Left hip: Normal.     Right knee: Normal.     Left knee: Normal.  Lymphadenopathy:     Head:     Right side of head: No submandibular, preauricular or posterior auricular adenopathy.     Left side of head: No submandibular, preauricular or posterior auricular adenopathy.     Cervical: No cervical adenopathy.     Right cervical: No superficial or deep cervical adenopathy.    Left cervical: No superficial or deep cervical adenopathy.  Skin:    General: Skin is warm and dry.     Coloration: Skin is not pale.      Findings: No abrasion, bruising, ecchymosis, erythema, lesion or rash.     Nails: There is no clubbing.  Neurological:     Mental Status: She is alert and oriented to person, place, and time.  Psychiatric:        Attention and Perception: Attention normal. She is attentive.        Mood and Affect: Affect is tearful.        Speech: Speech normal.        Behavior: Behavior is cooperative.        Thought Content: Thought content is delusional.        Judgment: Judgment normal.    Palate lesion           Assessment & Plan:    Delusional parasitosis:   I do not like to use this term but she has a perception of having parasites in her scalp   I wonder if this could be medication related.  She is not on Adderall has no history of injection use of drugs such as methamphetamine.  I query whether opiates that she is taking chronically could be a part of this  My greater concern for Toral is if this along with the visual hallucinations could represent a fairly unusual manifestation of dementia  I have never seen her come in with such complaints. She is quite tearful and upset and she and her daughter are clearly having a hard time.  When asked about other things such as where her HIV medications come from or about vaccine she is completely lucid again making me wonder about potential of a medication effect  I would suggest in meantime in terms of management of pruritus that she try nonsedating histamine and even could take a sedating histamine if she is careful with this but would defer to Dr. Mayford Knife and Dr. Criselda Peaches  Palate lesion not clear what this is we will refer to ENT  HIV disease:  I will add order HIV viral load CD4 count CBC with differential CMP, RPR GC and chlamydia and I will continue  Milon Score Manns's Biktarvy, prescription  Hyperlipidemia continue atorvastatin  Hypertension continue benazepril and amlodipine  HTN:  Continue benazepril  amlodipine.  Hyperlipidemia she is to continue her atorvastatin  Chronic opiate dependence: she is followed by Internal Medicine  Anxiety she is also on chronic benzodiazepenes and SSRI  Vaccine recommended updated COVID-19 and high-dose flu vaccine which she received today in clinic.  I have personally spent 40  minutes involved in face-to-face and non-face-to-face activities for this patient on the day of the visit. Professional time spent includes the following activities: Preparing to see the  patient (review of tests), Obtaining and/or reviewing separately obtained history (admission/discharge record), Performing a medically appropriate examination and/or evaluation , Ordering medications/tests/procedures, referring and communicating with other health care professionals, Documenting clinical information in the EMR, Independently interpreting results (not separately reported), Communicating results to the patient/family/caregiver, Counseling and educating the patient/family/caregiver and Care coordination (not separately reported).

## 2022-12-05 LAB — T-HELPER CELLS (CD4) COUNT (NOT AT ARMC)
CD4 % Helper T Cell: 43 % (ref 33–65)
CD4 T Cell Abs: 1556 /uL (ref 400–1790)

## 2022-12-05 LAB — LIPID PANEL
Cholesterol: 121 mg/dL (ref <200)
HDL: 70 mg/dL (ref >=50)
LDL Cholesterol (Calc): 38 mg/dL (calc)
Non-HDL Cholesterol (Calc): 51 mg/dL (calc) (ref <130)
Total CHOL/HDL Ratio: 1.7 (calc) (ref <5.0)
Triglycerides: 58 mg/dL (ref <150)

## 2022-12-05 LAB — COMPLETE METABOLIC PANEL WITH GFR
AG Ratio: 1.4 (calc) (ref 1.0–2.5)
ALT: 4 U/L — ABNORMAL LOW (ref 6–29)
AST: 12 U/L (ref 10–35)
Albumin: 4.2 g/dL (ref 3.6–5.1)
Alkaline phosphatase (APISO): 82 U/L (ref 37–153)
BUN: 9 mg/dL (ref 7–25)
CO2: 19 mmol/L — ABNORMAL LOW (ref 20–32)
Calcium: 9.3 mg/dL (ref 8.6–10.4)
Chloride: 111 mmol/L — ABNORMAL HIGH (ref 98–110)
Creat: 0.73 mg/dL (ref 0.60–0.95)
Globulin: 2.9 g/dL (calc) (ref 1.9–3.7)
Glucose, Bld: 113 mg/dL — ABNORMAL HIGH (ref 65–99)
Potassium: 3.1 mmol/L — ABNORMAL LOW (ref 3.5–5.3)
Sodium: 140 mmol/L (ref 135–146)
Total Bilirubin: 0.3 mg/dL (ref 0.2–1.2)
Total Protein: 7.1 g/dL (ref 6.1–8.1)
eGFR: 83 mL/min/{1.73_m2} (ref >=60)

## 2022-12-05 LAB — RPR: RPR Ser Ql: NONREACTIVE

## 2022-12-06 LAB — HIV-1 RNA QUANT-NO REFLEX-BLD
HIV 1 RNA Quant: NOT DETECTED Copies/mL
HIV-1 RNA Quant, Log: NOT DETECTED Log cps/mL

## 2022-12-08 LAB — HIV RNA, RTPCR W/R GT (RTI, PI,INT)
HIV 1 RNA Quant: <20 {copies}/mL — AB
HIV-1 RNA Quant, Log: <1.3 {Log} — AB

## 2022-12-11 ENCOUNTER — Ambulatory Visit: Payer: Self-pay | Admitting: Infectious Disease

## 2022-12-12 ENCOUNTER — Other Ambulatory Visit: Payer: Self-pay | Admitting: Internal Medicine

## 2022-12-21 ENCOUNTER — Other Ambulatory Visit: Payer: Self-pay

## 2022-12-21 ENCOUNTER — Ambulatory Visit: Payer: Medicare Other

## 2022-12-22 ENCOUNTER — Encounter: Payer: Medicare Other | Admitting: Internal Medicine

## 2022-12-27 ENCOUNTER — Emergency Department (HOSPITAL_COMMUNITY): Payer: Medicare Other

## 2022-12-27 ENCOUNTER — Encounter (HOSPITAL_COMMUNITY): Payer: Self-pay

## 2022-12-27 ENCOUNTER — Observation Stay (HOSPITAL_COMMUNITY)
Admission: EM | Admit: 2022-12-27 | Discharge: 2022-12-31 | Disposition: A | Discharge status: Home / Self Care | Payer: Medicare Other | Attending: Emergency Medicine | Admitting: Emergency Medicine

## 2022-12-27 DIAGNOSIS — I10 Essential (primary) hypertension: Secondary | ICD-10-CM | POA: Diagnosis present

## 2022-12-27 DIAGNOSIS — K2970 Gastritis, unspecified, without bleeding: Secondary | ICD-10-CM

## 2022-12-27 DIAGNOSIS — D649 Anemia, unspecified: Secondary | ICD-10-CM | POA: Insufficient documentation

## 2022-12-27 DIAGNOSIS — Z79899 Other long term (current) drug therapy: Secondary | ICD-10-CM | POA: Diagnosis not present

## 2022-12-27 DIAGNOSIS — F17210 Nicotine dependence, cigarettes, uncomplicated: Secondary | ICD-10-CM | POA: Diagnosis not present

## 2022-12-27 DIAGNOSIS — W44F3XA Food entering into or through a natural orifice, initial encounter: Secondary | ICD-10-CM | POA: Insufficient documentation

## 2022-12-27 DIAGNOSIS — Z7982 Long term (current) use of aspirin: Secondary | ICD-10-CM | POA: Diagnosis not present

## 2022-12-27 DIAGNOSIS — R441 Visual hallucinations: Secondary | ICD-10-CM

## 2022-12-27 DIAGNOSIS — K5731 Diverticulosis of large intestine without perforation or abscess with bleeding: Secondary | ICD-10-CM | POA: Insufficient documentation

## 2022-12-27 DIAGNOSIS — D62 Acute posthemorrhagic anemia: Secondary | ICD-10-CM | POA: Insufficient documentation

## 2022-12-27 DIAGNOSIS — K922 Gastrointestinal hemorrhage, unspecified: Secondary | ICD-10-CM | POA: Diagnosis not present

## 2022-12-27 DIAGNOSIS — F22 Delusional disorders: Secondary | ICD-10-CM | POA: Insufficient documentation

## 2022-12-27 DIAGNOSIS — Z7902 Long term (current) use of antithrombotics/antiplatelets: Secondary | ICD-10-CM | POA: Diagnosis not present

## 2022-12-27 DIAGNOSIS — I6782 Cerebral ischemia: Secondary | ICD-10-CM | POA: Diagnosis not present

## 2022-12-27 DIAGNOSIS — T18128A Food in esophagus causing other injury, initial encounter: Secondary | ICD-10-CM | POA: Diagnosis not present

## 2022-12-27 DIAGNOSIS — K2950 Unspecified chronic gastritis without bleeding: Secondary | ICD-10-CM | POA: Insufficient documentation

## 2022-12-27 DIAGNOSIS — I739 Peripheral vascular disease, unspecified: Secondary | ICD-10-CM | POA: Diagnosis present

## 2022-12-27 DIAGNOSIS — K921 Melena: Secondary | ICD-10-CM | POA: Diagnosis not present

## 2022-12-27 DIAGNOSIS — Z21 Asymptomatic human immunodeficiency virus [HIV] infection status: Secondary | ICD-10-CM | POA: Diagnosis not present

## 2022-12-27 DIAGNOSIS — R209 Unspecified disturbances of skin sensation: Secondary | ICD-10-CM

## 2022-12-27 DIAGNOSIS — E876 Hypokalemia: Secondary | ICD-10-CM | POA: Diagnosis not present

## 2022-12-27 DIAGNOSIS — Z862 Personal history of diseases of the blood and blood-forming organs and certain disorders involving the immune mechanism: Secondary | ICD-10-CM

## 2022-12-27 DIAGNOSIS — E782 Mixed hyperlipidemia: Secondary | ICD-10-CM | POA: Diagnosis not present

## 2022-12-27 DIAGNOSIS — B20 Human immunodeficiency virus [HIV] disease: Secondary | ICD-10-CM | POA: Diagnosis not present

## 2022-12-27 DIAGNOSIS — D500 Iron deficiency anemia secondary to blood loss (chronic): Secondary | ICD-10-CM

## 2022-12-27 DIAGNOSIS — G9389 Other specified disorders of brain: Secondary | ICD-10-CM | POA: Diagnosis not present

## 2022-12-27 LAB — CBC WITH DIFFERENTIAL/PLATELET
Abs Immature Granulocytes: 0.01 10*3/uL (ref 0.00–0.07)
Basophils Absolute: 0.1 10*3/uL (ref 0.0–0.1)
Basophils Relative: 1 %
Eosinophils Absolute: 0.1 10*3/uL (ref 0.0–0.5)
Eosinophils Relative: 1 %
HCT: 24.9 % — ABNORMAL LOW (ref 36.0–46.0)
Hemoglobin: 7.6 g/dL — ABNORMAL LOW (ref 12.0–15.0)
Immature Granulocytes: 0 %
Lymphocytes Relative: 48 %
Lymphs Abs: 2.7 10*3/uL (ref 0.7–4.0)
MCH: 28.3 pg (ref 26.0–34.0)
MCHC: 30.5 g/dL (ref 30.0–36.0)
MCV: 92.6 fL (ref 80.0–100.0)
Monocytes Absolute: 0.3 10*3/uL (ref 0.1–1.0)
Monocytes Relative: 6 %
Neutro Abs: 2.5 10*3/uL (ref 1.7–7.7)
Neutrophils Relative %: 44 %
Platelets: 335 10*3/uL (ref 150–400)
RBC: 2.69 MIL/uL — ABNORMAL LOW (ref 3.87–5.11)
RDW: 14.6 % (ref 11.5–15.5)
WBC: 5.6 10*3/uL (ref 4.0–10.5)
nRBC: 0 % (ref 0.0–0.2)

## 2022-12-27 LAB — COMPREHENSIVE METABOLIC PANEL
ALT: 9 U/L (ref 0–44)
AST: 17 U/L (ref 15–41)
Albumin: 3.6 g/dL (ref 3.5–5.0)
Alkaline Phosphatase: 65 U/L (ref 38–126)
Anion gap: 10 (ref 5–15)
BUN: 9 mg/dL (ref 8–23)
CO2: 18 mmol/L — ABNORMAL LOW (ref 22–32)
Calcium: 8.6 mg/dL — ABNORMAL LOW (ref 8.9–10.3)
Chloride: 111 mmol/L (ref 98–111)
Creatinine, Ser: 0.55 mg/dL (ref 0.44–1.00)
GFR, Estimated: >60 mL/min (ref >60)
Glucose, Bld: 109 mg/dL — ABNORMAL HIGH (ref 70–99)
Potassium: 3 mmol/L — ABNORMAL LOW (ref 3.5–5.1)
Sodium: 139 mmol/L (ref 135–145)
Total Bilirubin: 0.5 mg/dL (ref 0.3–1.2)
Total Protein: 7.3 g/dL (ref 6.5–8.1)

## 2022-12-27 LAB — URINALYSIS, ROUTINE W REFLEX MICROSCOPIC
Bacteria, UA: NONE SEEN
Bilirubin Urine: NEGATIVE
Glucose, UA: NEGATIVE mg/dL
Ketones, ur: NEGATIVE mg/dL
Leukocytes,Ua: NEGATIVE
Nitrite: NEGATIVE
Protein, ur: NEGATIVE mg/dL
Specific Gravity, Urine: 1.005 (ref 1.005–1.030)
pH: 7 (ref 5.0–8.0)

## 2022-12-27 LAB — TSH: TSH: 1.026 u[IU]/mL (ref 0.350–4.500)

## 2022-12-27 NOTE — H&P (Signed)
History and Physical    Patient: Jessica [name] IEP:[id number] DOB: 06/28/1940 DOA: 12/27/2022 DOS: the patient was seen and examined on 12/28/2022 PCP: Inez Catalina, MD  Patient coming from: Home  Chief Complaint:  Chief Complaint  Patient presents with   Feels like bugs are crawling on her scalp   HPI: Jessica [name] is a 82 y.o. female with medical history significant of hypertension, PAD, HIV, anxiety, tobacco abuse who presents to the emergency department with 2 months complaints of itchiness and sensation of something crawling on her scalp.  Apparently patient has been applying rubbing alcohol to her scalp every night and she has been pulling at her hair due to sensing of picking something in her scalp, but daughter reported that nothing has been seen in her hair per ED medical record.  She has been to her PCP due to same concern and was diagnosed to have delusional parasitosis. She also complained of a blister on the roof of her mouth, she states she has taken a course of acyclovir without relief. Daughter requests referral to a specialist per ED medical record. She was admitted here on 5/29 to 5/30 due to acute metabolic encephalopathy possibly due to polypharmacy  ED Course:  In the emergency department, BP was 170/67, other vital signs were within normal range.  Workup in the ED showed normocytic anemia, BMP was normal except for potassium of 3.0, bicarb 18, blood glucose 109.  TSH 1.026, urinalysis was unimpressive for UTI. CT head without contrast showed no acute intracranial abnormality, but showed generalized cerebral atrophy with chronic white matter small vessel ischemic changes.  Review of Systems: Review of systems as noted in the HPI. All other systems reviewed and are negative.   Past Medical History:  Diagnosis Date   Acute blood loss anemia 08/08/2020   Acute renal failure (ARF) (HCC) 08/17/2017   Anxiety 04/05/2012   Bilateral sensorineural  hearing loss 06/11/2014   Mild to moderate on the left side and slight to mild on the right side per audiometry 05/2014.  Hearing aides with possible masking of tinnitus recommended but patient wished to defer secondary to finances.   Blood transfusion without reported diagnosis    pt denies   Bursitis of right shoulder 07/12/2012   s/p shoulder injection 07/12/2012    Cataract of right eye    REMOVED RIGHT EYE 4-19    Constipation due to pain medication 04/27/2010   Diverticulosis 02/08/2012   Extensive left-sided diverticula on colonoscopy March 2012 per Dr. Jena Gauss    Essential hypertension 07/20/2006   Genital herpes 07/20/2006   Glaucoma of left eye 07/20/2006   Headache 10/20/2019   Heart murmur 1961   Human immunodeficiency virus disease (HCC) 03/27/1986   Hyperlipidemia LDL goal < 100 04/05/2012   Hypokalemia 04/12/2018   Insomnia 10/20/2019   Left hip pain 04/11/2021   Leg pain 04/11/2021   Long-term current use of opiate analgesic 03/17/2016   Loose stools 04/08/2019   Lumbar degenerative disc disease 07/20/2006   With chronic back pain    Marijuana use 07/03/2016   Micturition syncope 09/20/2015   Nausea and vomiting 04/08/2019   Opiate dependence (HCC) 04/12/2018   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   s/p aortobifem bypass 2009    Periumbilical hernia 05/18/2014   1 cm left periumbilical abdominal wall defect   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   DEXA 04/15/2012: L1-L4 spine T -3.9, Right femur T -3.0    Right rotator cuff tear  02/01/2013   Responds to periodic steroid injections   Seborrhea 09/01/2010   Shoulder pain, right 12/04/2017   Small bowel obstruction due to adhesions (HCC) 02/08/2012   s/p Exploratory laparotomy, lysis of adhesions 02/12/12     Subjective tinnitus of both ears 05/18/2014   Tobacco abuse 02/19/2012   Tobacco abuse    Vasovagal syncope 02/15/2015   Visual hallucination 12/04/2022   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 Jamey Ripa; "took out 12 inches of small intestiines and removed blockage"   COLONOSCOPY  2012   EYE SURGERY     EYE SURGERY  06/29/2020   06-29-2020- RIGHT CATARACT REMOVED AND LEFT EYE SURGERY TO REMOVE SAND LIKE SUBSTANCE    FEMORAL ARTERY EXPLORATION Left 07/10/2020   Procedure: REDO LEFT FEMORAL ARTERY EXPOSURE;  Surgeon: Nada Libman, MD;  Location: [facility] OR;  Service: Vascular;  Laterality: Left;   LAPAROTOMY  02/12/2012   Procedure: EXPLORATORY LAPAROTOMY;  Surgeon: Almond Lint, MD;  Location: [facility] OR;  Service: General;  Laterality: N/A;  Exploratory Laparotomy, lysis of adhesions   SMALL INTESTINE SURGERY     THROMBECTOMY FEMORAL ARTERY Left 07/10/2020   Procedure: THROMBECTOMY AORTA-BIFEMORAL GRAFT, PROFUNDA, AND SUPERFICIAL FEMORAL ARTERY  LEFT LEG;  Surgeon: Nada Libman, MD;  Location: [facility] OR;  Service: Vascular;  Laterality: Left;    Social History:  reports that she has been smoking cigarettes. She has a 15 pack-year smoking history. She has been exposed to tobacco smoke. She has never used smokeless tobacco. She reports that she does not drink alcohol and does not use drugs.   Allergies  Allergen Reactions   Hctz [Hydrochlorothiazide] Other (See Comments)    Dizziness, syncope; does NOT wish to take anymore    Family History  Problem Relation Age of Onset   Kidney failure Mother    Diabetes Mother    Hypertension Mother    Heart disease Mother    Glaucoma Father    Congestive Heart Failure Sister    Diabetes  Sister    Kidney disease Sister    Diabetes Brother    Unexplained death Brother 32       Automobile accident   Hypothyroidism Daughter    Arthritis Daughter        Neck/Back   Healthy Son    HIV/AIDS Brother    HIV Daughter    Kidney disease Daughter    Arthritis Son        Knee   Colon cancer Neg Hx    Colon polyps Neg Hx    Esophageal cancer Neg Hx    Rectal cancer Neg Hx    Stomach cancer Neg Hx      Prior to Admission medications   Medication Sig Start Date End Date Taking? Authorizing Provider  acyclovir (ZOVIRAX) 400 MG tablet TAKE 1 TABLET BY MOUTH THREE TIMES DAILY AS NEEDED FOR OUTBREAKS FOR 7 DAYS 10/10/22   Inez Catalina, MD  alendronate (FOSAMAX) 70 MG tablet  Take 1 tablet (70 mg total) by mouth every Sunday. TAKE 1 TABLET BY MOUTH ONCE WEEKLY ON SUNDAY take with a full glass of water on an empty stomach Strength: 70 mg 07/16/22   Shon Hale, MD  ALPRAZolam Prudy Feeler) 0.5 MG tablet TAKE 2 TO 3 TABLETS BY MOUTH AT BEDTIME AS NEEDED FOR anxiety/sleep 11/10/22   Inez Catalina, MD  amLODipine (NORVASC) 10 MG tablet Take 1 tablet (10 mg total) by mouth daily. 07/13/22   Shon Hale, MD  ammonium lactate (AMLACTIN) 12 % cream Apply topically. 05/15/22   [provider]  aspirin EC 81 MG tablet Take 1 tablet (81 mg total) by mouth daily with breakfast. 07/13/22   Mariea Clonts, Courage, MD  atorvastatin (LIPITOR) 10 MG tablet Take 1 tablet (10 mg total) by mouth at bedtime. 07/13/22   Shon Hale, MD  benazepril (LOTENSIN) 40 MG tablet Take 1 tablet (40 mg total) by mouth daily. 07/13/22   Shon Hale, MD  bictegravir-emtricitabine-tenofovir AF (BIKTARVY) 50-200-25 MG TABS tablet Take 1 tablet by mouth daily. 12/04/22   Randall Hiss, MD  clopidogrel (PLAVIX) 75 MG tablet Take 1 tablet (75 mg total) by mouth daily. 08/10/22   Shon Hale, MD  diclofenac Sodium (VOLTAREN) 1 % GEL Apply 2 g topically 4 (four) times daily. APPLY TWO GRAMS TO AFFECTED AREA(S)  FOUR TIMES DAILY Strength: 1 % 10/31/22   Inez Catalina, MD  dorzolamide-timolol (COSOPT) 2-0.5 % ophthalmic solution Place 1 drop into the right eye 2 (two) times daily. 07/18/22   [provider]  gabapentin (NEURONTIN) 300 MG capsule Take 1 capsule (300 mg total) by mouth 3 (three) times daily. 10/06/22 10/06/23  Inez Catalina, MD  melatonin 5 MG TABS Take 1 tablet (5 mg total) by mouth at bedtime as needed. Patient not taking: Reported on 09/27/2022 08/10/22   Shon Hale, MD  Multiple Vitamin (MULTIVITAMIN WITH MINERALS) TABS Take 1 tablet by mouth daily. Patient not taking: Reported on 09/27/2022    [provider]  oxyCODONE-acetaminophen (PERCOCET/ROXICET) 5-325 MG tablet Take 1 tablet by mouth every 12 (twelve) hours as needed for severe pain. 12/13/22   Inez Catalina, MD  phenylephrine (NEO-SYNEPHRINE) 1 % nasal spray Place 1 drop into both nostrils every 6 (six) hours as needed for congestion. Patient not taking: Reported on 09/27/2022    [provider]  polyethylene glycol (MIRALAX / GLYCOLAX) 17 g packet Take 17 g by mouth See admin instructions. Take 1 dose twice daily for 1 week and then 1 dose daily after that indefinitely 08/10/22   Shon Hale, MD  psyllium (REGULOID) 0.52 g capsule Take 0.52 g by mouth daily.    [provider]  sulfamethoxazole-trimethoprim (BACTRIM DS) 800-160 MG tablet Take 1 tablet by mouth 2 (two) times daily. 10/02/22   Lovie Macadamia, MD  terazosin (HYTRIN) 5 MG capsule Take 5 mg by mouth at bedtime. 11/08/22   [provider]  triamcinolone ointment (KENALOG) 0.1 % Apply topically 2 (two) times daily. to affected area(s) 10/31/22   Inez Catalina, MD    Physical Exam: BP (!) 131/47 (BP Location: Right Arm)   Pulse 74   Temp 98.2 F (36.8 C) (Oral)   Resp 18   Ht 5\' 7"  (1.702 m)   Wt 56.2 kg   SpO2 98%   BMI 19.42 kg/m   General: 82 y.o. year-old female well developed well nourished in no  acute distress.  Alert and oriented x3. HEENT: NCAT,  normal scalp with no obvious head lice noted.  Noted bump like substance in the upper palate Neck: Supple, trachea medial Cardiovascular: Regular rate and rhythm with no rubs or gallops.  No thyromegaly or JVD noted.  No lower extremity edema. 2/4 pulses in all 4 extremities. Respiratory: Clear to auscultation with no wheezes or rales. Good inspiratory effort. Abdomen: Soft, nontender nondistended with normal bowel sounds x4 quadrants. Muskuloskeletal: No cyanosis, clubbing or edema noted bilaterally Neuro: CN II-XII intact, strength 5/5 x 4, sensation, reflexes intact Skin: No ulcerative lesions noted or rashes Psychiatry: Mood is appropriate for condition and setting          Labs on Admission:  Basic Metabolic Panel: Recent Labs  Lab 12/27/22 1552  NA 139  K 3.0*  CL 111  CO2 18*  GLUCOSE 109*  BUN 9  CREATININE 0.55  CALCIUM 8.6*   Liver Function Tests: Recent Labs  Lab 12/27/22 1552  AST 17  ALT 9  ALKPHOS 65  BILITOT 0.5  PROT 7.3  ALBUMIN 3.6   No results for input(s): "LIPASE", "AMYLASE" in the last 168 hours. No results for input(s): "AMMONIA" in the last 168 hours. CBC: Recent Labs  Lab 12/27/22 1552  WBC 5.6  NEUTROABS 2.5  HGB 7.6*  HCT 24.9*  MCV 92.6  PLT 335   Cardiac Enzymes: No results for input(s): "CKTOTAL", "CKMB", "CKMBINDEX", "TROPONINI" in the last 168 hours.  BNP (last 3 results) No results for input(s): "BNP" in the last 8760 hours.  ProBNP (last 3 results) No results for input(s): "PROBNP" in the last 8760 hours.  CBG: No results for input(s): "GLUCAP" in the last 168 hours.  Radiological Exams on Admission: CT Head Wo Contrast  Result Date: 12/27/2022 CLINICAL DATA:  Altered mental status. EXAM: CT HEAD WITHOUT CONTRAST TECHNIQUE: Contiguous axial images were obtained from the base of the skull through the vertex without intravenous contrast. RADIATION DOSE REDUCTION:  This exam was performed according to the departmental dose-optimization program which includes automated exposure control, adjustment of the mA and/or kV according to patient size and/or use of iterative reconstruction technique. COMPARISON:  Aug 09, 2022 FINDINGS: Brain: There is mild cerebral atrophy with widening of the extra-axial spaces and ventricular dilatation. There are areas of decreased attenuation within the white matter tracts of the supratentorial brain, consistent with microvascular disease changes. Vascular: No hyperdense vessel or unexpected calcification. Skull: Normal. Negative for fracture or focal lesion. Sinuses/Orbits: No acute finding. Other: None. IMPRESSION: 1. Generalized cerebral atrophy with chronic white matter small vessel ischemic changes. 2. No acute intracranial abnormality. Electronically Signed   By: Aram Candela M.D.   On: 12/27/2022 20:08    EKG: I independently viewed the EKG done and my findings are as followed: EKG was not done in the ED  Assessment/Plan Present on Admission:  GI bleed  Hypokalemia  HIV (human immunodeficiency virus infection) (HCC)  Essential hypertension  Mixed hyperlipidemia  PAD (peripheral artery disease) (HCC)  Principal Problem:   GI bleed Active Problems:   HIV (human immunodeficiency virus infection) (HCC)   Essential hypertension   PAD (peripheral artery disease) (HCC)   Mixed hyperlipidemia   Hypokalemia   Ekbom's delusional parasitosis (HCC)  GI bleed H/H= 7.6/24.9, this was 10.8/32.9 on 12/04/2022 Hemoccult was positive Continue IV Protonix 40 mg twice daily N.p.o. at midnight Gastroenterologist was consulted and will see patient in the morning per ED PA  Hypokalemia Potassium 3.0, this will be replenished  Bump like substance in mouth  The cause of this is unknown at this time Differentials include cysts, canker sores, cold sores, and cancerous growths She endorsed decreased bump size with acyclovir.   Continue Zovirax Patient has history of HIV, consider infectious disease consult  ?  Delusional parasitosis Consider referral to dermatology for possible reassurance/management  HIV Controlled.  Continue Biktarvy Patient follows with Dr. Algis Liming  Essential hypertension Continue benazepril, terazosin and Norvasc  Mixed hyperlipidemia Continue Lipitor  PAD Continue Plavix, statin, aspirin   DVT prophylaxis: SCDs  Advance Care Planning: CODE STATUS: Full code  Consults: Gastroenterology  Family Communication: None at bedside  Severity of Illness: The appropriate patient status for this patient is OBSERVATION. Observation status is judged to be reasonable and necessary in order to provide the required intensity of service to ensure the patient's safety. The patient's presenting symptoms, physical exam findings, and initial radiographic and laboratory data in the context of their medical condition is felt to place them at decreased risk for further clinical deterioration. Furthermore, it is anticipated that the patient will be medically stable for discharge from the hospital within 2 midnights of admission.   Author: Frankey Shown, DO 12/28/2022 2:35 AM  For on call review [email or web address].

## 2022-12-27 NOTE — ED Provider Notes (Signed)
[facility] AT [facility] Provider Note   CSN: [id number] Arrival date & time: 12/27/22  1350     History  Chief Complaint  Patient presents with   Feels like bugs are crawling on her scalp    Jessica [name] is a 82 y.o. female.  HPI     Jessica [name] is a 82 y.o. female with past medical history of HIV, glaucoma, hypertension, generalized anxiety disorder, who presents to the Emergency Department complaining of itching and "something crawling out of my scalp."  Symptoms have been present for 2 months.  Patient is accompanied to the emergency department with her daughter.  Daughter states that she is applying rubbing alcohol to her scalp nightly and pulling at her hair.  Patient states that she picks at something in her scalp and tries to pull it out but it "goes back in."  She has been seen for this by her PCP without resolution of her symptoms.  Daughter is requesting referral to a specialist.  Patient denies rash or itching to other parts of her body.  [employer] daily is also currently taking acyclovir for genital herpes outbreaks.  No recent medication changes.  Home Medications Prior to Admission medications   Medication Sig Start Date End Date Taking? Authorizing Provider  acyclovir (ZOVIRAX) 400 MG tablet TAKE 1 TABLET BY MOUTH THREE TIMES DAILY AS NEEDED FOR OUTBREAKS FOR 7 DAYS 10/10/22   Inez Catalina, MD  alendronate (FOSAMAX) 70 MG tablet Take 1 tablet (70 mg total) by mouth every Sunday. TAKE 1 TABLET BY MOUTH ONCE WEEKLY ON SUNDAY take with a full glass of water on an empty stomach Strength: 70 mg 07/16/22   Shon Hale, MD  ALPRAZolam Prudy Feeler) 0.5 MG tablet TAKE 2 TO 3 TABLETS BY MOUTH AT BEDTIME AS NEEDED FOR anxiety/sleep 11/10/22   Inez Catalina, MD  amLODipine (NORVASC) 10 MG tablet Take 1 tablet (10 mg total) by mouth daily. 07/13/22   Shon Hale, MD  ammonium lactate (AMLACTIN) 12 % cream Apply  topically. 05/15/22   [provider]  aspirin EC 81 MG tablet Take 1 tablet (81 mg total) by mouth daily with breakfast. 07/13/22   Mariea Clonts, Courage, MD  atorvastatin (LIPITOR) 10 MG tablet Take 1 tablet (10 mg total) by mouth at bedtime. 07/13/22   Shon Hale, MD  benazepril (LOTENSIN) 40 MG tablet Take 1 tablet (40 mg total) by mouth daily. 07/13/22   Shon Hale, MD  bictegravir-emtricitabine-tenofovir AF (BIKTARVY) 50-200-25 MG TABS tablet Take 1 tablet by mouth daily. 12/04/22   Randall Hiss, MD  clopidogrel (PLAVIX) 75 MG tablet Take 1 tablet (75 mg total) by mouth daily. 08/10/22   Shon Hale, MD  diclofenac Sodium (VOLTAREN) 1 % GEL Apply 2 g topically 4 (four) times daily. APPLY TWO GRAMS TO AFFECTED AREA(S) FOUR TIMES DAILY Strength: 1 % 10/31/22   Inez Catalina, MD  dorzolamide-timolol (COSOPT) 2-0.5 % ophthalmic solution Place 1 drop into the right eye 2 (two) times daily. 07/18/22   [provider]  gabapentin (NEURONTIN) 300 MG capsule Take 1 capsule (300 mg total) by mouth 3 (three) times daily. 10/06/22 10/06/23  Inez Catalina, MD  melatonin 5 MG TABS Take 1 tablet (5 mg total) by mouth at bedtime as needed. Patient not taking: Reported on 09/27/2022 08/10/22   Shon Hale, MD  Multiple Vitamin (MULTIVITAMIN WITH MINERALS) TABS Take 1 tablet by mouth daily. Patient not taking:  Reported on 09/27/2022    [provider]  oxyCODONE-acetaminophen (PERCOCET/ROXICET) 5-325 MG tablet Take 1 tablet by mouth every 12 (twelve) hours as needed for severe pain. 12/13/22   Inez Catalina, MD  phenylephrine (NEO-SYNEPHRINE) 1 % nasal spray Place 1 drop into both nostrils every 6 (six) hours as needed for congestion. Patient not taking: Reported on 09/27/2022    [provider]  polyethylene glycol (MIRALAX / GLYCOLAX) 17 g packet Take 17 g by mouth See admin instructions. Take 1 dose twice daily for 1 week and then 1 dose daily after that  indefinitely 08/10/22   Shon Hale, MD  psyllium (REGULOID) 0.52 g capsule Take 0.52 g by mouth daily.    [provider]  sulfamethoxazole-trimethoprim (BACTRIM DS) 800-160 MG tablet Take 1 tablet by mouth 2 (two) times daily. 10/02/22   Lovie Macadamia, MD  terazosin (HYTRIN) 5 MG capsule Take 5 mg by mouth at bedtime. 11/08/22   [provider]  triamcinolone ointment (KENALOG) 0.1 % Apply topically 2 (two) times daily. to affected area(s) 10/31/22   Inez Catalina, MD      Allergies    Hctz [hydrochlorothiazide]    Review of Systems   Review of Systems  Constitutional:  Negative for chills, diaphoresis and fever.  Respiratory:  Negative for shortness of breath.   Cardiovascular:  Negative for chest pain.  Gastrointestinal:  Negative for diarrhea, nausea and vomiting.  Musculoskeletal:  Negative for neck pain and neck stiffness.  Skin:  Negative for rash.  Neurological:  Negative for dizziness, seizures, speech difficulty, weakness and numbness.  Psychiatric/Behavioral:  Positive for agitation and hallucinations.     Physical Exam Updated Vital Signs BP (!) 152/68   Pulse 72   Temp 98.2 F (36.8 C) (Oral)   Resp 16   Ht 5\' 7"  (1.702 m)   Wt 56.2 kg   SpO2 97%   BMI 19.42 kg/m  Physical Exam Vitals and nursing note reviewed. Exam conducted with a chaperone present.  Constitutional:      General: She is not in acute distress.    Appearance: Normal appearance. She is not ill-appearing or toxic-appearing.  HENT:     Head: Normocephalic and atraumatic.     Comments: Scalp appears clean, scattered areas of alopecia.  No head lice seen, no open wounds of the scalp    Mouth/Throat:     Mouth: Mucous membranes are moist.  Eyes:     Comments: Patient partially blind  Cardiovascular:     Rate and Rhythm: Normal rate and regular rhythm.     Pulses: Normal pulses.  Pulmonary:     Effort: Pulmonary effort is normal. No respiratory distress.  Abdominal:      General: There is no distension.     Palpations: Abdomen is soft.     Tenderness: There is no abdominal tenderness. There is no guarding.  Genitourinary:    Rectum: Guaiac result positive. No tenderness or external hemorrhoid. Normal anal tone.     Comments: Digital rectal exam performed by me.  Small amount of stool in the rectal vault, black heme positive stool present.  No palpable rectal masses. Musculoskeletal:        General: Normal range of motion.     Right lower leg: No edema.     Left lower leg: No edema.  Skin:    General: Skin is warm.     Capillary Refill: Capillary refill takes less than 2 seconds.  Findings: No erythema or rash.  Neurological:     Mental Status: She is alert.     Sensory: No sensory deficit.     Motor: No weakness.     Comments: CN II through XII grossly intact.  Speech clear.  No focal neurodeficits     ED Results / Procedures / Treatments   Labs (all labs ordered are listed, but only abnormal results are displayed) Labs Reviewed  CBC WITH DIFFERENTIAL/PLATELET - Abnormal; Notable for the following components:      Result Value   RBC 2.69 (*)    Hemoglobin 7.6 (*)    HCT 24.9 (*)    All other components within normal limits  COMPREHENSIVE METABOLIC PANEL - Abnormal; Notable for the following components:   Potassium 3.0 (*)    CO2 18 (*)    Glucose, Bld 109 (*)    Calcium 8.6 (*)    All other components within normal limits  URINALYSIS, ROUTINE W REFLEX MICROSCOPIC - Abnormal; Notable for the following components:   Color, Urine COLORLESS (*)    Hgb urine dipstick SMALL (*)    All other components within normal limits  TSH  T-HELPER CELLS (CD4) COUNT (NOT AT [facility])    EKG None  Radiology CT Head Wo Contrast  Result Date: 12/27/2022 CLINICAL DATA:  Altered mental status. EXAM: CT HEAD WITHOUT CONTRAST TECHNIQUE: Contiguous axial images were obtained from the base of the skull through the vertex without intravenous contrast.  RADIATION DOSE REDUCTION: This exam was performed according to the departmental dose-optimization program which includes automated exposure control, adjustment of the mA and/or kV according to patient size and/or use of iterative reconstruction technique. COMPARISON:  Aug 09, 2022 FINDINGS: Brain: There is mild cerebral atrophy with widening of the extra-axial spaces and ventricular dilatation. There are areas of decreased attenuation within the white matter tracts of the supratentorial brain, consistent with microvascular disease changes. Vascular: No hyperdense vessel or unexpected calcification. Skull: Normal. Negative for fracture or focal lesion. Sinuses/Orbits: No acute finding. Other: None. IMPRESSION: 1. Generalized cerebral atrophy with chronic white matter small vessel ischemic changes. 2. No acute intracranial abnormality. Electronically Signed   By: Aram Candela M.D.   On: 12/27/2022 20:08    Procedures Procedures    Medications Ordered in ED Medications - No data to display  ED Course/ Medical Decision Making/ A&P                                 Medical Decision Making Patient brought in accompanied by her daughter for evaluation of possible psychosis versus infection.  Patient reports feeling "something coming out of my scalp."  Symptoms present for 2 months.  She does have a history of HIV on oral medications.  Followed by PCP in [place].  Had CD4 count of greater than 1500 with his helper T-cell count of 43 last month.  No recent medication changes.  Differential would include but not limited to acute infection, acute psychosis secondary to medication  Amount and/or Complexity of Data Reviewed Labs: ordered.    Details: Labs interpreted by me, no evidence of leukocytosis, notably, hemoglobin today of 7.6 it was 10.8 three weeks ago.  Chemistries show mild hypokalemia with potassium of 3.  Urinalysis without evidence of infection Radiology: ordered.    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  No history of GI bleed.  She does not take NSAIDs she is on clopidogrel.  Heme positive stool on DRE.  No abdominal pain.  Patient reports last colonoscopy greater than 10 years ago.  Workup this evening without clear cause of patient's psychosis.  No evidence of infection.  Recheck of patient CD4 counts is pending  Discussed findings with Dr. Jena Gauss with GI.  He recommends hospital admission, clear liquids and n.p.o. after midnight, trending H&H and will see patient tomorrow in consultation for possible colonoscopy  Discussed findings with [facility] hospitalist, Dr. Thomes Dinning who agrees to admit.           Final Clinical Impression(s) / ED Diagnoses Final diagnoses:  Lower GI bleed    Rx / DC Orders ED Discharge Orders     None         Pauline Aus, PA-C 12/27/22 2309    Franne Forts, DO 12/29/22 0159

## 2022-12-27 NOTE — ED Triage Notes (Addendum)
Pt c/o feeling like something is crawling on her scalp x2 months.  Pt's daughter reports the Pt has been scratching her scalp and using alcohol pads w/ no relief.  Daughter reports "we haven't seen anything with our naked eye."  Pt was seen by PCP for the same and diagnosed w/ delusional parasitosis.    Additionally, Pt reports she has a blister on the roof of her mouth.  Sts she has taken a course of acyclovir w/o relief.   Daughter and Pt would like a referral to a specialist.

## 2022-12-27 NOTE — ED Notes (Signed)
Hospitalist at the bedside 

## 2022-12-28 ENCOUNTER — Encounter (HOSPITAL_COMMUNITY): Payer: Self-pay | Admitting: Internal Medicine

## 2022-12-28 ENCOUNTER — Observation Stay (HOSPITAL_COMMUNITY): Payer: Medicare Other | Admitting: Anesthesiology

## 2022-12-28 ENCOUNTER — Observation Stay (HOSPITAL_COMMUNITY): Payer: Medicare Other

## 2022-12-28 ENCOUNTER — Other Ambulatory Visit: Payer: Self-pay

## 2022-12-28 ENCOUNTER — Encounter (HOSPITAL_COMMUNITY)
Admission: EM | Disposition: A | Discharge status: Home / Self Care | Payer: Self-pay | Source: Home / Self Care | Attending: Emergency Medicine

## 2022-12-28 DIAGNOSIS — N189 Chronic kidney disease, unspecified: Secondary | ICD-10-CM

## 2022-12-28 DIAGNOSIS — I739 Peripheral vascular disease, unspecified: Secondary | ICD-10-CM | POA: Diagnosis not present

## 2022-12-28 DIAGNOSIS — I639 Cerebral infarction, unspecified: Secondary | ICD-10-CM | POA: Diagnosis not present

## 2022-12-28 DIAGNOSIS — D649 Anemia, unspecified: Secondary | ICD-10-CM

## 2022-12-28 DIAGNOSIS — K31A19 Gastric intestinal metaplasia without dysplasia, unspecified site: Secondary | ICD-10-CM

## 2022-12-28 DIAGNOSIS — Z7902 Long term (current) use of antithrombotics/antiplatelets: Secondary | ICD-10-CM

## 2022-12-28 DIAGNOSIS — K2950 Unspecified chronic gastritis without bleeding: Secondary | ICD-10-CM

## 2022-12-28 DIAGNOSIS — K2970 Gastritis, unspecified, without bleeding: Secondary | ICD-10-CM | POA: Diagnosis not present

## 2022-12-28 DIAGNOSIS — R195 Other fecal abnormalities: Secondary | ICD-10-CM

## 2022-12-28 DIAGNOSIS — F22 Delusional disorders: Secondary | ICD-10-CM | POA: Insufficient documentation

## 2022-12-28 DIAGNOSIS — D62 Acute posthemorrhagic anemia: Secondary | ICD-10-CM | POA: Diagnosis not present

## 2022-12-28 DIAGNOSIS — K3189 Other diseases of stomach and duodenum: Secondary | ICD-10-CM | POA: Diagnosis not present

## 2022-12-28 DIAGNOSIS — Z862 Personal history of diseases of the blood and blood-forming organs and certain disorders involving the immune mechanism: Secondary | ICD-10-CM | POA: Insufficient documentation

## 2022-12-28 DIAGNOSIS — T18128A Food in esophagus causing other injury, initial encounter: Secondary | ICD-10-CM | POA: Diagnosis not present

## 2022-12-28 DIAGNOSIS — I129 Hypertensive chronic kidney disease with stage 1 through stage 4 chronic kidney disease, or unspecified chronic kidney disease: Secondary | ICD-10-CM | POA: Diagnosis not present

## 2022-12-28 DIAGNOSIS — Z21 Asymptomatic human immunodeficiency virus [HIV] infection status: Secondary | ICD-10-CM | POA: Diagnosis not present

## 2022-12-28 DIAGNOSIS — D500 Iron deficiency anemia secondary to blood loss (chronic): Secondary | ICD-10-CM | POA: Insufficient documentation

## 2022-12-28 DIAGNOSIS — I10 Essential (primary) hypertension: Secondary | ICD-10-CM | POA: Diagnosis not present

## 2022-12-28 HISTORY — PX: BIOPSY: SHX5522

## 2022-12-28 HISTORY — PX: ESOPHAGOGASTRODUODENOSCOPY (EGD) WITH PROPOFOL: SHX5813

## 2022-12-28 HISTORY — DX: Personal history of diseases of the blood and blood-forming organs and certain disorders involving the immune mechanism: Z86.2

## 2022-12-28 LAB — COMPREHENSIVE METABOLIC PANEL
ALT: 9 U/L (ref 0–44)
AST: 17 U/L (ref 15–41)
Albumin: 3.5 g/dL (ref 3.5–5.0)
Alkaline Phosphatase: 61 U/L (ref 38–126)
Anion gap: 9 (ref 5–15)
BUN: 10 mg/dL (ref 8–23)
CO2: 20 mmol/L — ABNORMAL LOW (ref 22–32)
Calcium: 8.5 mg/dL — ABNORMAL LOW (ref 8.9–10.3)
Chloride: 109 mmol/L (ref 98–111)
Creatinine, Ser: 0.57 mg/dL (ref 0.44–1.00)
GFR, Estimated: >60 mL/min (ref >60)
Glucose, Bld: 99 mg/dL (ref 70–99)
Potassium: 3.4 mmol/L — ABNORMAL LOW (ref 3.5–5.1)
Sodium: 138 mmol/L (ref 135–145)
Total Bilirubin: 0.4 mg/dL (ref 0.3–1.2)
Total Protein: 6.7 g/dL (ref 6.5–8.1)

## 2022-12-28 LAB — MAGNESIUM
Magnesium: 2.1 mg/dL (ref 1.7–2.4)
Magnesium: 2.1 mg/dL (ref 1.7–2.4)

## 2022-12-28 LAB — CBC
HCT: 23.4 % — ABNORMAL LOW (ref 36.0–46.0)
Hemoglobin: 7.1 g/dL — ABNORMAL LOW (ref 12.0–15.0)
MCH: 28.4 pg (ref 26.0–34.0)
MCHC: 30.3 g/dL (ref 30.0–36.0)
MCV: 93.6 fL (ref 80.0–100.0)
Platelets: 306 10*3/uL (ref 150–400)
RBC: 2.5 MIL/uL — ABNORMAL LOW (ref 3.87–5.11)
RDW: 14.8 % (ref 11.5–15.5)
WBC: 4.8 10*3/uL (ref 4.0–10.5)
nRBC: 0 % (ref 0.0–0.2)

## 2022-12-28 LAB — PHOSPHORUS: Phosphorus: 2.4 mg/dL — ABNORMAL LOW (ref 2.5–4.6)

## 2022-12-28 LAB — IRON AND TIBC
Iron: 18 ug/dL — ABNORMAL LOW (ref 28–170)
Saturation Ratios: 4 % — ABNORMAL LOW (ref 10.4–31.8)
TIBC: 486 ug/dL — ABNORMAL HIGH (ref 250–450)
UIBC: 468 ug/dL

## 2022-12-28 LAB — FOLATE: Folate: 15.3 ng/mL (ref >5.9)

## 2022-12-28 LAB — VITAMIN B12: Vitamin B-12: 219 pg/mL (ref 180–914)

## 2022-12-28 LAB — FERRITIN: Ferritin: 9 ng/mL — ABNORMAL LOW (ref 11–307)

## 2022-12-28 SURGERY — ESOPHAGOGASTRODUODENOSCOPY (EGD) WITH PROPOFOL
Anesthesia: General

## 2022-12-28 MED ORDER — ACETAMINOPHEN 325 MG PO TABS
650.0000 mg | ORAL_TABLET | Freq: Four times a day (QID) | ORAL | Status: DC | PRN
  Filled 2022-12-28: qty 2

## 2022-12-28 MED ORDER — BICTEGRAVIR-EMTRICITAB-TENOFOV 50-200-25 MG PO TABS
1.0000 | ORAL_TABLET | Freq: Every day | ORAL | Status: DC
  Administered 2022-12-28 – 2022-12-31 (×4): 1 via ORAL
  Filled 2022-12-28 (×5): qty 1

## 2022-12-28 MED ORDER — SODIUM CHLORIDE 0.9 % IV SOLN: INTRAVENOUS | Status: DC

## 2022-12-28 MED ORDER — ATORVASTATIN CALCIUM 10 MG PO TABS
10.0000 mg | ORAL_TABLET | Freq: Every day | ORAL | Status: DC
  Administered 2022-12-28 – 2022-12-30 (×3): 10 mg via ORAL
  Filled 2022-12-28 (×3): qty 1

## 2022-12-28 MED ORDER — ONDANSETRON HCL 4 MG/2ML IJ SOLN: 4.0000 mg | Freq: Four times a day (QID) | INTRAMUSCULAR | Status: DC | PRN

## 2022-12-28 MED ORDER — POTASSIUM CHLORIDE 10 MEQ/100ML IV SOLN
10.0000 meq | INTRAVENOUS | Status: AC
  Administered 2022-12-28 (×3): 10 meq via INTRAVENOUS
  Filled 2022-12-28 (×3): qty 100

## 2022-12-28 MED ORDER — PHENYLEPHRINE HCL (PRESSORS) 10 MG/ML IV SOLN
INTRAVENOUS | Status: DC | PRN
Start: 2022-12-28 — End: 2022-12-28
  Administered 2022-12-28: 80 ug via INTRAVENOUS

## 2022-12-28 MED ORDER — TERAZOSIN HCL 5 MG PO CAPS
5.0000 mg | ORAL_CAPSULE | Freq: Every day | ORAL | Status: DC
  Administered 2022-12-28 – 2022-12-30 (×3): 5 mg via ORAL
  Filled 2022-12-28 (×4): qty 1

## 2022-12-28 MED ORDER — ASPIRIN 81 MG PO TBEC
81.0000 mg | DELAYED_RELEASE_TABLET | Freq: Every day | ORAL | Status: DC
  Filled 2022-12-28: qty 1

## 2022-12-28 MED ORDER — ACYCLOVIR 800 MG PO TABS
400.0000 mg | ORAL_TABLET | Freq: Three times a day (TID) | ORAL | Status: DC
  Administered 2022-12-28 – 2022-12-31 (×7): 400 mg via ORAL
  Filled 2022-12-28 (×9): qty 1

## 2022-12-28 MED ORDER — BENAZEPRIL HCL 20 MG PO TABS
40.0000 mg | ORAL_TABLET | Freq: Every day | ORAL | Status: DC
  Administered 2022-12-28 – 2022-12-31 (×3): 40 mg via ORAL
  Filled 2022-12-28 (×4): qty 2

## 2022-12-28 MED ORDER — EPHEDRINE SULFATE (PRESSORS) 50 MG/ML IJ SOLN
INTRAMUSCULAR | Status: DC | PRN
Start: 2022-12-28 — End: 2022-12-28
  Administered 2022-12-28: 15 mg via INTRAVENOUS
  Administered 2022-12-28: 10 mg via INTRAVENOUS

## 2022-12-28 MED ORDER — ACETAMINOPHEN 500 MG PO TABS
1000.0000 mg | ORAL_TABLET | Freq: Three times a day (TID) | ORAL | Status: DC
  Administered 2022-12-28 – 2022-12-31 (×8): 1000 mg via ORAL
  Filled 2022-12-28 (×9): qty 2

## 2022-12-28 MED ORDER — PANTOPRAZOLE SODIUM 40 MG IV SOLR
40.0000 mg | Freq: Two times a day (BID) | INTRAVENOUS | Status: DC
  Administered 2022-12-28 – 2022-12-31 (×8): 40 mg via INTRAVENOUS
  Filled 2022-12-28 (×8): qty 10

## 2022-12-28 MED ORDER — DORZOLAMIDE HCL-TIMOLOL MAL 2-0.5 % OP SOLN
1.0000 [drp] | Freq: Two times a day (BID) | OPHTHALMIC | Status: DC
  Administered 2022-12-28 – 2022-12-31 (×8): 1 [drp] via OPHTHALMIC
  Filled 2022-12-28 (×2): qty 10

## 2022-12-28 MED ORDER — PROPOFOL 10 MG/ML IV BOLUS
INTRAVENOUS | Status: DC | PRN
  Administered 2022-12-28 (×2): 50 mg via INTRAVENOUS

## 2022-12-28 MED ORDER — AMLODIPINE BESYLATE 5 MG PO TABS
10.0000 mg | ORAL_TABLET | Freq: Every day | ORAL | Status: DC
  Administered 2022-12-28 – 2022-12-31 (×3): 10 mg via ORAL
  Filled 2022-12-28 (×4): qty 2

## 2022-12-28 MED ORDER — LACTATED RINGERS IV SOLN
INTRAVENOUS | Status: DC | PRN
Start: 2022-12-28 — End: 2022-12-28

## 2022-12-28 MED ORDER — ACETAMINOPHEN 650 MG RE SUPP: 650.0000 mg | Freq: Four times a day (QID) | RECTAL | Status: DC | PRN

## 2022-12-28 MED ORDER — LIDOCAINE HCL 1 % IJ SOLN
INTRAMUSCULAR | Status: DC | PRN
  Administered 2022-12-28: 50 mg via INTRADERMAL

## 2022-12-28 MED ORDER — PEG 3350-KCL-NA BICARB-NACL 420 G PO SOLR
4000.0000 mL | Freq: Once | ORAL | Status: AC
  Administered 2022-12-28: 4000 mL via ORAL
  Filled 2022-12-28: qty 4000

## 2022-12-28 MED ORDER — ONDANSETRON HCL 4 MG PO TABS: 4.0000 mg | ORAL_TABLET | Freq: Four times a day (QID) | ORAL | Status: DC | PRN

## 2022-12-28 MED ORDER — MELATONIN 3 MG PO TABS
6.0000 mg | ORAL_TABLET | Freq: Once | ORAL | Status: AC
  Administered 2022-12-28: 6 mg via ORAL
  Filled 2022-12-28: qty 2

## 2022-12-28 MED ORDER — CLOPIDOGREL BISULFATE 75 MG PO TABS: 75.0000 mg | ORAL_TABLET | Freq: Every day | ORAL | Status: DC

## 2022-12-28 NOTE — ED Notes (Signed)
Pt lying in bed no distress seen. Pt stated I have been having this itchy feeling in my scalp for awhile. Its in between my skull. Examined scalp no bugs, lesions, or trauma seen. Pt also stated hip has been bothering me for months. Doctor notified and tylenol administered.

## 2022-12-28 NOTE — Consult Note (Addendum)
Gastroenterology Consult   Referring Provider: No ref. provider found Primary Care Physician:  Inez Catalina, MD Primary Gastroenterologist:  previously Dr. [name] [facility]  Patient ID: Jessica [name]; [id number]; 02-07-1941   Admit date: 12/27/2022  LOS: 0 days   Date of Consultation: 12/28/2022  Reason for Consultation:  GI bleed    History of Present Illness   Jessica [name] is a 82 y.o. 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  Patient was incidentally found to have profound anemia and on exam had black stool which was Hemoccult positive.  In the ED: White blood cell count 5600, hemoglobin 7.6 (down from 10.8 in September and 15.3 in May 2024).  MCV 92.6, platelets 335,000, CN III, BUN 9, creatinine 0.55, albumin 3.6, total bilirubin 0.5, alkaline phosphatase 65, AST 17, ALT 9.  December 04, 2022, HIV RNA not detected.  CT head without contrast yesterday showed generalized cerebral atrophy with chronic white matter small vessel ischemic changes but no acute intracranial abnormality.  Today: Hemoglobin 7.1, potassium 3.4, BUN 10.   Consult: Patient is unable to see enough to distinguish if there is blood in the stool or black stool. She denies having stomach issues. No abdominal pain. Appetite is good. BMs regular. No heartburn, dysphagia. No prior EGD. No ASA or NSAIDS.   Colonoscopy March 2012, Dr. Jena Gauss: -Minimal rectal mucosal trauma, likely secondary to intermittent trauma -Particular, diminutive polyp, ascending colon removed  Prior to Admission medications   Medication Sig Start Date End  Date Taking? Authorizing Provider  acyclovir (ZOVIRAX) 400 MG tablet TAKE 1 TABLET BY MOUTH THREE TIMES DAILY AS NEEDED FOR OUTBREAKS FOR 7 DAYS 10/10/22   Inez Catalina, MD  alendronate (FOSAMAX) 70 MG tablet Take 1 tablet (70 mg total) by mouth every Sunday. TAKE 1 TABLET BY MOUTH ONCE WEEKLY ON SUNDAY take with a full glass of water on an empty stomach Strength: 70 mg 07/16/22   Shon Hale, MD  ALPRAZolam Prudy Feeler) 0.5 MG tablet TAKE 2 TO 3 TABLETS BY MOUTH AT BEDTIME AS NEEDED FOR anxiety/sleep 11/10/22   Inez Catalina, MD  amLODipine (NORVASC) 10 MG tablet Take 1 tablet (10 mg total) by mouth daily. 07/13/22   Shon Hale, MD  ammonium lactate (AMLACTIN) 12 % cream Apply topically. 05/15/22   [provider]  aspirin EC 81 MG tablet Take 1 tablet (81 mg total) by mouth daily with breakfast. 07/13/22   Mariea Clonts, Courage, MD  atorvastatin (LIPITOR) 10 MG tablet Take 1 tablet (10 mg total) by mouth at bedtime. 07/13/22   Shon Hale, MD  benazepril (LOTENSIN) 40 MG tablet Take 1 tablet (40 mg total) by mouth daily. 07/13/22   Shon Hale, MD  bictegravir-emtricitabine-tenofovir AF (BIKTARVY) 50-200-25 MG TABS tablet Take 1 tablet by mouth daily. 12/04/22   Randall Hiss, MD  clopidogrel (PLAVIX) 75 MG tablet Take 1 tablet (75 mg total) by mouth daily. 08/10/22   Shon Hale, MD  diclofenac Sodium (VOLTAREN) 1 % GEL Apply 2 g topically 4 (four) times daily. APPLY TWO GRAMS TO AFFECTED AREA(S) FOUR TIMES DAILY Strength: 1 % 10/31/22   Inez Catalina, MD  dorzolamide-timolol (COSOPT) 2-0.5 % ophthalmic solution Place 1 drop into the right eye 2 (two) times daily. 07/18/22   [provider]  gabapentin (NEURONTIN) 300 MG capsule Take 1 capsule (300 mg total) by mouth 3 (three) times daily. 10/06/22 10/06/23  Inez Catalina, MD  melatonin 5 MG TABS Take 1 tablet (5 mg total) by mouth at bedtime as needed. Patient not taking: Reported on 09/27/2022 08/10/22   Shon Hale, MD  Multiple Vitamin (MULTIVITAMIN WITH MINERALS) TABS Take 1 tablet by mouth daily. Patient not taking: Reported on 09/27/2022    [provider]  oxyCODONE-acetaminophen (PERCOCET/ROXICET) 5-325 MG tablet Take 1 tablet by mouth every 12 (twelve) hours as needed for severe pain. 12/13/22   Inez Catalina, MD  phenylephrine (NEO-SYNEPHRINE) 1 % nasal spray Place 1 drop into both nostrils every 6 (six) hours as needed for congestion. Patient not taking: Reported on 09/27/2022    [provider]  polyethylene glycol (MIRALAX / GLYCOLAX) 17 g packet Take 17 g by mouth See admin instructions. Take 1 dose twice daily for 1 week and then 1 dose daily after that indefinitely 08/10/22   Shon Hale, MD  psyllium (REGULOID) 0.52 g capsule Take 0.52 g by mouth daily.    [provider]  sulfamethoxazole-trimethoprim (BACTRIM DS) 800-160 MG tablet Take 1 tablet by mouth 2 (two) times daily. 10/02/22   Lovie Macadamia, MD  terazosin (HYTRIN) 5 MG capsule Take 5 mg by mouth at bedtime. 11/08/22   [provider]  triamcinolone ointment (KENALOG) 0.1 % Apply topically 2 (two) times daily. to affected area(s) 10/31/22   Inez Catalina, MD    Current Facility-Administered Medications  Medication Dose Route Frequency Provider Last Rate Last Admin   acetaminophen (TYLENOL) tablet 650 mg  650 mg Oral Q6H PRN Adefeso, Oladapo, DO       Or   acetaminophen (TYLENOL) suppository 650 mg  650 mg Rectal Q6H PRN Adefeso, Oladapo, DO       acyclovir (ZOVIRAX) tablet 400 mg  400 mg Oral TID Adefeso, Oladapo, DO       amLODipine (NORVASC) tablet 10 mg  10 mg Oral Daily Adefeso, Oladapo, DO       atorvastatin (LIPITOR) tablet 10 mg  10 mg Oral QHS Adefeso, Oladapo, DO       benazepril (LOTENSIN) tablet 40 mg  40 mg Oral Daily Adefeso, Oladapo, DO       bictegravir-emtricitabine-tenofovir AF (BIKTARVY) 50-200-25 MG per tablet 1 tablet  1 tablet Oral Daily Adefeso, Oladapo, DO        ondansetron (ZOFRAN) tablet 4 mg  4 mg Oral Q6H PRN Adefeso, Oladapo, DO       Or   ondansetron (ZOFRAN) injection 4 mg  4 mg Intravenous Q6H PRN Adefeso, Oladapo, DO       pantoprazole (PROTONIX) injection 40 mg  40 mg Intravenous Q12H Adefeso, Oladapo, DO   40 mg at 12/28/22 0244   terazosin (HYTRIN) capsule 5 mg  5 mg Oral QHS Adefeso, Oladapo, DO       Current Outpatient Medications  Medication Sig Dispense Refill   acyclovir (ZOVIRAX) 400 MG tablet TAKE 1 TABLET BY MOUTH THREE TIMES DAILY AS NEEDED FOR OUTBREAKS FOR 7 DAYS 21 tablet 5   alendronate (FOSAMAX) 70 MG tablet Take 1 tablet (70 mg total) by mouth every Sunday. TAKE 1 TABLET BY MOUTH ONCE WEEKLY ON SUNDAY take with a full glass of water on an empty stomach Strength: 70  mg 12 tablet 3   ALPRAZolam (XANAX) 0.5 MG tablet TAKE 2 TO 3 TABLETS BY MOUTH AT BEDTIME AS NEEDED FOR anxiety/sleep 90 tablet 1   amLODipine (NORVASC) 10 MG tablet Take 1 tablet (10 mg total) by mouth daily. 90 tablet 3   ammonium lactate (AMLACTIN) 12 % cream Apply topically.     aspirin EC 81 MG tablet Take 1 tablet (81 mg total) by mouth daily with breakfast. 30 tablet 5   atorvastatin (LIPITOR) 10 MG tablet Take 1 tablet (10 mg total) by mouth at bedtime. 90 tablet 3   benazepril (LOTENSIN) 40 MG tablet Take 1 tablet (40 mg total) by mouth daily. 90 tablet 3   bictegravir-emtricitabine-tenofovir AF (BIKTARVY) 50-200-25 MG TABS tablet Take 1 tablet by mouth daily. 30 tablet 11   clopidogrel (PLAVIX) 75 MG tablet Take 1 tablet (75 mg total) by mouth daily. 90 tablet 3   diclofenac Sodium (VOLTAREN) 1 % GEL Apply 2 g topically 4 (four) times daily. APPLY TWO GRAMS TO AFFECTED AREA(S) FOUR TIMES DAILY Strength: 1 % 350 g 2   dorzolamide-timolol (COSOPT) 2-0.5 % ophthalmic solution Place 1 drop into the right eye 2 (two) times daily.     gabapentin (NEURONTIN) 300 MG capsule Take 1 capsule (300 mg total) by mouth 3 (three) times daily. 270 capsule 3   melatonin  5 MG TABS Take 1 tablet (5 mg total) by mouth at bedtime as needed. (Patient not taking: Reported on 09/27/2022) 10 tablet 0   Multiple Vitamin (MULTIVITAMIN WITH MINERALS) TABS Take 1 tablet by mouth daily. (Patient not taking: Reported on 09/27/2022)     oxyCODONE-acetaminophen (PERCOCET/ROXICET) 5-325 MG tablet Take 1 tablet by mouth every 12 (twelve) hours as needed for severe pain. 60 tablet 0   phenylephrine (NEO-SYNEPHRINE) 1 % nasal spray Place 1 drop into both nostrils every 6 (six) hours as needed for congestion. (Patient not taking: Reported on 09/27/2022)     polyethylene glycol (MIRALAX / GLYCOLAX) 17 g packet Take 17 g by mouth See admin instructions. Take 1 dose twice daily for 1 week and then 1 dose daily after that indefinitely 45 each 2   psyllium (REGULOID) 0.52 g capsule Take 0.52 g by mouth daily.     sulfamethoxazole-trimethoprim (BACTRIM DS) 800-160 MG tablet Take 1 tablet by mouth 2 (two) times daily. 10 tablet 0   terazosin (HYTRIN) 5 MG capsule Take 5 mg by mouth at bedtime.     triamcinolone ointment (KENALOG) 0.1 % Apply topically 2 (two) times daily. to affected area(s) 454 g 3    Allergies as of 12/27/2022 - Review Complete 12/27/2022  Allergen Reaction Noted   Hctz [hydrochlorothiazide] Other (See Comments) 09/05/2013    Past Medical History:  Diagnosis Date   Acute blood loss anemia 08/08/2020   Acute renal failure (ARF) (HCC) 08/17/2017   Anxiety 04/05/2012   Bilateral sensorineural hearing loss 06/11/2014   Mild to moderate on the left side and slight to mild on the right side per audiometry 05/2014.  Hearing aides with possible masking of tinnitus recommended but patient wished to defer secondary to finances.   Blood transfusion without reported diagnosis    pt denies   Bursitis of right shoulder 07/12/2012   s/p shoulder injection 07/12/2012    Cataract of right eye    REMOVED RIGHT EYE 4-19    Constipation due to pain medication 04/27/2010   Diverticulosis  02/08/2012   Extensive left-sided diverticula on colonoscopy March 2012 per Dr. Jena Gauss  Essential hypertension 07/20/2006   Genital herpes 07/20/2006   Glaucoma of left eye 07/20/2006   Headache 10/20/2019   Heart murmur 1961   Human immunodeficiency virus disease (HCC) 03/27/1986   Hyperlipidemia LDL goal < 100 04/05/2012   Hypokalemia 04/12/2018   Insomnia 10/20/2019   Left hip pain 04/11/2021   Leg pain 04/11/2021   Long-term current use of opiate analgesic 03/17/2016   Loose stools 04/08/2019   Lumbar degenerative disc disease 07/20/2006   With chronic back pain    Marijuana use 07/03/2016   Micturition syncope 09/20/2015   Nausea and vomiting 04/08/2019   Opiate dependence (HCC) 04/12/2018   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   s/p aortobifem bypass 2009    Periumbilical hernia 05/18/2014   1 cm left periumbilical abdominal wall defect   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   DEXA 04/15/2012: L1-L4 spine T -3.9, Right femur T -3.0    Right rotator cuff tear 02/01/2013   Responds to periodic steroid injections   Seborrhea 09/01/2010   Shoulder pain, right 12/04/2017   Small bowel obstruction due to adhesions (HCC) 02/08/2012   s/p Exploratory laparotomy, lysis of adhesions 02/12/12     Subjective tinnitus of both ears 05/18/2014   Tobacco abuse 02/19/2012   Tobacco abuse    Vasovagal syncope 02/15/2015   Visual hallucination 12/04/2022   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 Jamey Ripa; "took out 12  inches of small intestiines and removed blockage"   COLONOSCOPY  2012   EYE SURGERY     EYE SURGERY  06/29/2020   06-29-2020- RIGHT CATARACT REMOVED AND LEFT EYE SURGERY TO REMOVE SAND LIKE SUBSTANCE    FEMORAL ARTERY EXPLORATION Left 07/10/2020   Procedure: REDO LEFT FEMORAL ARTERY EXPOSURE;  Surgeon: Nada Libman, MD;  Location: [facility] OR;  Service: Vascular;  Laterality: Left;   LAPAROTOMY  02/12/2012   Procedure: EXPLORATORY LAPAROTOMY;  Surgeon: Almond Lint, MD;  Location: [facility] OR;  Service: General;  Laterality: N/A;  Exploratory Laparotomy, lysis of adhesions   SMALL INTESTINE SURGERY     THROMBECTOMY FEMORAL ARTERY Left 07/10/2020   Procedure: THROMBECTOMY AORTA-BIFEMORAL GRAFT, PROFUNDA, AND SUPERFICIAL FEMORAL ARTERY  LEFT LEG;  Surgeon: Nada Libman, MD;  Location: [facility] OR;  Service: Vascular;  Laterality: Left;    Family History  Problem Relation Age of Onset   Kidney failure Mother    Diabetes Mother    Hypertension Mother    Heart disease Mother    Glaucoma Father    Congestive Heart Failure Sister    Diabetes Sister    Kidney disease Sister    Diabetes Brother    Unexplained death Brother 45       Automobile accident   Hypothyroidism Daughter    Arthritis Daughter        Neck/Back   Healthy Son    HIV/AIDS Brother    HIV Daughter    Kidney disease Daughter    Arthritis Son  Knee   Colon cancer Neg Hx    Colon polyps Neg Hx    Esophageal cancer Neg Hx    Rectal cancer Neg Hx    Stomach cancer Neg Hx     Social History   Socioeconomic History   Marital status: Widowed    Spouse name: Not on file   Number of children: 4   Years of education: 2y college   Highest education level: Not on file  Occupational History   Occupation: retired    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   Tobacco Use   Smoking status: Every Day    Current packs/day: 0.30    Average packs/day: 0.3 packs/day for 50.0 years (15.0 ttl pk-yrs)    Types:  Cigarettes    Passive exposure: Current   Smokeless tobacco: Never   Tobacco comments:    5 cigs per day  Vaping Use   Vaping status: Never Used  Substance and Sexual Activity   Alcohol use: No    Alcohol/week: 0.0 standard drinks of alcohol    Comment: "last drink of alcohol ~ 1977"   Drug use: No   Sexual activity: Never  Other Topics Concern   Not on file  Social History Narrative   Lives alone in [place], [place]   Social Determinants of Health   Financial Resource Strain: Low Risk  (08/16/2022)   Overall Financial Resource Strain (CARDIA)    Difficulty of Paying Living Expenses: Not hard at all  Food Insecurity: No Food Insecurity (08/16/2022)   Hunger Vital Sign    Worried About Running Out of Food in the Last Year: Never true    Ran Out of Food in the Last Year: Never true  Transportation Needs: No Transportation Needs (08/16/2022)   PRAPARE - Administrator, Civil Service (Medical): No    Lack of Transportation (Non-Medical): No  Physical Activity: Insufficiently Active (08/16/2022)   Exercise Vital Sign    Days of Exercise per Week: 1 day    Minutes of Exercise per Session: 10 min  Stress: Stress Concern Present (08/16/2022)   [employer] of Occupational Health - Occupational Stress Questionnaire    Feeling of Stress : To some extent  Social Connections: Moderately Isolated (08/16/2022)   Social Connection and Isolation Panel [NHANES]    Frequency of Communication with Friends and Family: More than three times a week    Frequency of Social Gatherings with Friends and Family: More than three times a week    Attends Religious Services: More than 4 times per year    Active Member of [employer] or Organizations: No    Attends Banker Meetings: Never    Marital Status: Widowed  Intimate Partner Violence: Not At Risk (08/16/2022)   Humiliation, Afraid, Rape, and Kick questionnaire    Fear of Current or Ex-Partner: No    Emotionally Abused: No    Physically  Abused: No    Sexually Abused: No     Review of System:   General: Negative for anorexia, weight loss, fever, chills, fatigue, weakness. Eyes: Negative for acute vision changes, sees only shadows.  ENT: Negative for hoarseness, difficulty swallowing , nasal congestion. CV: Negative for chest pain, angina, palpitations, dyspnea on exertion, peripheral edema.  Respiratory: Negative for dyspnea at rest, dyspnea on exertion, cough, sputum, wheezing.  GI: See history of present illness. GU:  Negative for dysuria, hematuria, urinary incontinence, urinary frequency, nocturnal urination.  MS: chronic pain Derm: Negative for  rash or itching.  Neuro: Negative for weakness, abnormal sensation, seizure, frequent headaches, memory loss, confusion.  Psych: Negative for anxiety, depression, suicidal ideation, hallucinations.  Endo: Negative for unusual weight change.  Heme: Negative for bruising or bleeding. Allergy: Negative for rash or hives.      Physical Examination:   Vital signs in last 24 hours: Temp:  [98.2 F (36.8 C)-98.3 F (36.8 C)] 98.3 F (36.8 C) (10/17 0252) Pulse Rate:  [69-79] 73 (10/17 0500) Resp:  [9-20] 18 (10/17 0500) BP: (116-182)/(47-80) 132/64 (10/17 0500) SpO2:  [96 %-100 %] 97 % (10/17 0500) Weight:  [56.2 kg] 56.2 kg (10/16 1412)    General: Well-nourished, well-developed in no acute distress.  Head: Normocephalic, atraumatic.   Eyes: Conjunctiva pink, no icterus. Mouth: Oropharyngeal mucosa moist and pink   Neck: Supple without thyromegaly, masses, or lymphadenopathy.  Lungs: Clear to auscultation bilaterally.  Heart: Regular rate and rhythm, no murmurs rubs or gallops.  Abdomen: Bowel sounds are normal, nontender, nondistended, no hepatosplenomegaly or masses, no abdominal bruits or hernia , no rebound or guarding.   Rectal: not performe Extremities: No lower extremity edema, clubbing, deformity.  Neuro: Alert and oriented x 4 , grossly normal  neurologically.  Skin: Warm and dry, no rash or jaundice.   Psych: Alert and cooperative, normal mood and affect.        Intake/Output from previous day: No intake/output data recorded. Intake/Output this shift: No intake/output data recorded.  Lab Results:   CBC Recent Labs    12/27/22 1552 12/28/22 0312  WBC 5.6 4.8  HGB 7.6* 7.1*  HCT 24.9* 23.4*  MCV 92.6 93.6  PLT 335 306   BMET Recent Labs    12/27/22 1552 12/28/22 0312  NA 139 138  K 3.0* 3.4*  CL 111 109  CO2 18* 20*  GLUCOSE 109* 99  BUN 9 10  CREATININE 0.55 0.57  CALCIUM 8.6* 8.5*   LFT Recent Labs    12/27/22 1552 12/28/22 0312  BILITOT 0.5 0.4  ALKPHOS 65 61  AST 17 17  ALT 9 9  PROT 7.3 6.7  ALBUMIN 3.6 3.5    Lipase No results for input(s): "LIPASE" in the last 72 hours.  PT/INR No results for input(s): "LABPROT", "INR" in the last 72 hours.   Hepatitis Panel No results for input(s): "HEPBSAG", "HCVAB", "HEPAIGM", "HEPBIGM" in the last 72 hours.   Imaging Studies:   CT Head Wo Contrast  Result Date: 12/27/2022 CLINICAL DATA:  Altered mental status. EXAM: CT HEAD WITHOUT CONTRAST TECHNIQUE: Contiguous axial images were obtained from the base of the skull through the vertex without intravenous contrast. RADIATION DOSE REDUCTION: This exam was performed according to the departmental dose-optimization program which includes automated exposure control, adjustment of the mA and/or kV according to patient size and/or use of iterative reconstruction technique. COMPARISON:  Aug 09, 2022 FINDINGS: Brain: There is mild cerebral atrophy with widening of the extra-axial spaces and ventricular dilatation. There are areas of decreased attenuation within the white matter tracts of the supratentorial brain, consistent with microvascular disease changes. Vascular: No hyperdense vessel or unexpected calcification. Skull: Normal. Negative for fracture or focal lesion. Sinuses/Orbits: No acute finding. Other:  None. IMPRESSION: 1. Generalized cerebral atrophy with chronic white matter small vessel ischemic changes. 2. No acute intracranial abnormality. Electronically Signed   By: Aram Candela M.D.   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  Patient was incidentally found to have profound anemia and on exam had black stool which was Hemoccult positive.  Anemia/GI bleed: -Hemoglobin 15.14 Jul 2022--> 10.19 November 2022--> 7.6 on admission -On digital rectal exam, stool was black and Hemoccult positive -No prior upper endoscopy.  Remote colonoscopy as outlined above. -She denies any oral NSAID or aspirin use.  She is chronically on Plavix.  -Concern for upper GI bleed given presentation.  Of note patient is not able to see well enough to determine if she has had blood in the stool or melena previously.  Plan:   Initially recommend EGD, to discuss timing with Dr. Tasia Catchings.  I have discussed the risks, alternatives, benefits with regards to but not limited to the risk of reaction to medication, bleeding, infection, perforation and the patient is agreeable to proceed. Written consent to be obtained. Pantoprazole 40mg  IV Q12 hours.  F/u anemia labs. Transfuse to keep Hgb above 7.   LOS: 0 days   We would like to thank you for the opportunity to participate in the care of Mckelle Redford.  Leanna Battles. Dixon [facility]os [facility] [phone] 10/17/20248:23 AM

## 2022-12-28 NOTE — ED Notes (Signed)
Pt ambulated to bathroom with one assist and steady gait. MRI here for pt.

## 2022-12-28 NOTE — Op Note (Signed)
[facility] Patient Name: Jessica [name] [facility] Procedure Date: 12/28/2022 1:52 PM MRN: [id number] Date of Birth: 1940/03/31 Attending MD: Sanjuan Dame , MD, [id number] CSN: [id number] Age: 82 Admit Type: Inpatient Procedure:                Upper GI endoscopy Indications:              Acute post hemorrhagic anemia, Iron deficiency                            anemia secondary to chronic blood loss Providers:                Sanjuan Dame, MD, Crystal Page, Sheran Fava, Tammy Vaught, RN, Francoise Ceo RN, RN Referring MD:              Medicines:                Monitored Anesthesia Care Complications:            No immediate complications. Estimated Blood Loss:     Estimated blood loss: none. Procedure:                Pre-Anesthesia Assessment:                           - Prior to the procedure, a History and Physical                            was performed, and patient medications and                            allergies were reviewed. The patient's tolerance of                            previous anesthesia was also reviewed. The risks                            and benefits of the procedure and the sedation                            options and risks were discussed with the patient.                            All questions were answered, and informed consent                            was obtained. Prior Anticoagulants: The patient has                            taken no anticoagulant or antiplatelet agents                            except for aspirin. ASA Grade Assessment: III - A  patient with severe systemic disease. After                            reviewing the risks and benefits, the patient was                            deemed in satisfactory condition to undergo the                            procedure.                           After obtaining informed consent, the endoscope was                             passed under direct vision. Throughout the                            procedure, the patient's blood pressure, pulse, and                            oxygen saturations were monitored continuously. The                            [id number]-H190 ([id number]) scope was introduced through the                            mouth, and advanced to the second part of duodenum.                            The upper GI endoscopy was accomplished without                            difficulty. The patient tolerated the procedure                            well. Scope In: 3:20:27 PM Scope Out: 3:24:31 PM Total Procedure Duration: 0 hours 4 minutes 4 seconds  Findings:      Food was found in the middle third of the esophagus.      A small amount of food (residue) was found in the gastric body.      Diffuse moderate inflammation characterized by nodularity was found in       the stomach. Biopsies were taken with a cold forceps for histology.      The duodenal bulb and second portion of the duodenum were normal. Impression:               - Food in the middle third of the esophagus gently                            pushed down in the stomach                           - A small amount of food (residue) in the stomach.                            Marland Kitchen  No evidence of old or active bleeding                           - Gastritis. Biopsied.                           - Normal duodenal bulb and second portion of the                            duodenum. Moderate Sedation:      Per Anesthesia Care Recommendation:           - Await pathology results.                           -Esophagram as outpatient given food content seen                            on EGD in esophagus to assess motility                           - Consider IV iron loading while inpatient for                            severe IDA ( Ferritin:9)                           - Prepare for colonoscopy as upper endoscopy did                            not explain cause  of severe Iron deficiency anemia                           -Clear liquid idet today                           -Npo past midnight Procedure Code(s):        --- Professional ---                           [phone], Esophagogastroduodenoscopy, flexible,                            transoral; with biopsy, single or multiple Diagnosis Code(s):        --- Professional ---                           V95.638V, Food in esophagus causing other injury,                            initial encounter                           K29.70, Gastritis, unspecified, without bleeding                           D62, Acute posthemorrhagic anemia  D50.0, Iron deficiency anemia secondary to blood                            loss (chronic) CPT copyright 2022 [employer]. All rights reserved. The codes documented in this report are preliminary and upon coder review may  be revised to meet current compliance requirements. Sanjuan Dame, MD Sanjuan Dame, MD 12/28/2022 3:34:33 PM This report has been signed electronically. Number of Addenda: 0
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
## 2022-12-28 NOTE — Plan of Care (Signed)
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
## 2022-12-28 NOTE — ED Notes (Signed)
Pt resting. Nad. A/o. Pt was lying on left side and c/o left hip pain. Pt repositioned herself on back and slid up. Dr tat in with pt earlier to update. Pt advised npo and aware awaiting admission bed. No needs expressed at this time. States she still does feels bugs crawling on her especially in head. None seen

## 2022-12-28 NOTE — Anesthesia Preprocedure Evaluation (Signed)
Anesthesia Evaluation  Patient identified by MRN, date of birth, ID band Patient awake    Reviewed: Allergy & Precautions, H&P , NPO status , Patient's Chart, lab work & pertinent test results, reviewed documented beta blocker date and time   Airway Mallampati: II  TM Distance: >3 FB Neck ROM: full    Dental no notable dental hx.    Pulmonary neg pulmonary ROS, Current Smoker   Pulmonary exam normal breath sounds clear to auscultation       Cardiovascular Exercise Tolerance: Good hypertension, + Peripheral Vascular Disease  + Valvular Problems/Murmurs  Rhythm:regular Rate:Normal     Neuro/Psych  Headaches PSYCHIATRIC DISORDERS Anxiety      Neuromuscular disease    GI/Hepatic negative GI ROS, Neg liver ROS,,,  Endo/Other  negative endocrine ROS    Renal/GU Renal disease  negative genitourinary   Musculoskeletal   Abdominal   Peds  Hematology  (+) Blood dyscrasia, anemia   Anesthesia Other Findings   Reproductive/Obstetrics negative OB ROS                             Anesthesia Physical Anesthesia Plan  ASA: 4 and emergent  Anesthesia Plan: General   Post-op Pain Management:    Induction:   PONV Risk Score and Plan: Propofol infusion  Airway Management Planned:   Additional Equipment:   Intra-op Plan:   Post-operative Plan:   Informed Consent: I have reviewed the patients History and Physical, chart, labs and discussed the procedure including the risks, benefits and alternatives for the proposed anesthesia with the patient or authorized representative who has indicated his/her understanding and acceptance.     Dental Advisory Given  Plan Discussed with: CRNA  Anesthesia Plan Comments:        Anesthesia Quick Evaluation

## 2022-12-28 NOTE — ED Notes (Signed)
Pt ambulated to restroom with one assist.

## 2022-12-28 NOTE — Transfer of Care (Signed)
Immediate Anesthesia Transfer of Care Note  Patient: Jessica [name]  Procedure(s) Performed: ESOPHAGOGASTRODUODENOSCOPY (EGD) WITH PROPOFOL BIOPSY  Patient Location: PACU  Anesthesia Type:General  Level of Consciousness: awake  Airway & Oxygen Therapy: Patient Spontanous Breathing  Post-op Assessment: Report given to RN  Post vital signs: Reviewed  Last Vitals:  Vitals Value Taken Time  BP 83/48 12/28/22 1537  Temp 36.4 C 12/28/22 1530  Pulse 74 12/28/22 1539  Resp 21 12/28/22 1539  SpO2 100 % 12/28/22 1539  Vitals shown include unfiled device data.  Last Pain:  Vitals:   12/28/22 1530  TempSrc:   PainSc: Asleep      Patients Stated Pain Goal: 9 (12/28/22 1413)  Complications: No notable events documented.

## 2022-12-28 NOTE — ED Notes (Signed)
Pt returned from mri.  No changes. Pt requesting something stronger for her hip pain than tylenol

## 2022-12-28 NOTE — Interval H&P Note (Signed)
History and Physical Interval Note:  12/28/2022 3:01 PM  Jessica [name]  has presented today for surgery, with the diagnosis of anemia, melena, hemoccult positive stool.  The various methods of treatment have been discussed with the patient and family. After consideration of risks, benefits and other options for treatment, the patient has consented to  Procedure(s): ESOPHAGOGASTRODUODENOSCOPY (EGD) WITH PROPOFOL (N/A) as a surgical intervention.  The patient's history has been reviewed, patient examined, no change in status, stable for surgery.  I have reviewed the patient's chart and labs.  Questions were answered to the patient's satisfaction.    We will proceed with EGD as scheduled.  Patient is awake alert , oriented x3  I thoroughly discussed with the patient the procedure, including the risks involved. Patient understands what the procedure involves including the benefits and any risks. Patient understands alternatives to the proposed procedure. Risks including (but not limited to) bleeding, tearing of the lining (perforation), rupture of adjacent organs, problems with heart and lung function, infection, and medication reactions. A small percentage of complications may require surgery, hospitalization, repeat endoscopic procedure, and/or transfusion.  Patient understood and agreed.     Juanetta Beets [name]

## 2022-12-28 NOTE — H&P (View-Only) (Signed)
Gastroenterology Consult   Referring Provider: No ref. provider found Primary Care Physician:  Inez Catalina, MD Primary Gastroenterologist:  previously Dr. [name] [facility]  Patient ID: Jessica [name]; [id number]; 02-07-1941   Admit date: 12/27/2022  LOS: 0 days   Date of Consultation: 12/28/2022  Reason for Consultation:  GI bleed    History of Present Illness   Latrista Smolenski is a 82 y.o. 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  Patient was incidentally found to have profound anemia and on exam had black stool which was Hemoccult positive.  In the ED: White blood cell count 5600, hemoglobin 7.6 (down from 10.8 in September and 15.3 in May 2024).  MCV 92.6, platelets 335,000, CN III, BUN 9, creatinine 0.55, albumin 3.6, total bilirubin 0.5, alkaline phosphatase 65, AST 17, ALT 9.  December 04, 2022, HIV RNA not detected.  CT head without contrast yesterday showed generalized cerebral atrophy with chronic white matter small vessel ischemic changes but no acute intracranial abnormality.  Today: Hemoglobin 7.1, potassium 3.4, BUN 10.   Consult: Patient is unable to see enough to distinguish if there is blood in the stool or black stool. She denies having stomach issues. No abdominal pain. Appetite is good. BMs regular. No heartburn, dysphagia. No prior EGD. No ASA or NSAIDS.   Colonoscopy March 2012, Dr. Jena Gauss: -Minimal rectal mucosal trauma, likely secondary to intermittent trauma -Particular, diminutive polyp, ascending colon removed  Prior to Admission medications   Medication Sig Start Date End  Date Taking? Authorizing Provider  acyclovir (ZOVIRAX) 400 MG tablet TAKE 1 TABLET BY MOUTH THREE TIMES DAILY AS NEEDED FOR OUTBREAKS FOR 7 DAYS 10/10/22   Inez Catalina, MD  alendronate (FOSAMAX) 70 MG tablet Take 1 tablet (70 mg total) by mouth every Sunday. TAKE 1 TABLET BY MOUTH ONCE WEEKLY ON SUNDAY take with a full glass of water on an empty stomach Strength: 70 mg 07/16/22   Shon Hale, MD  ALPRAZolam Prudy Feeler) 0.5 MG tablet TAKE 2 TO 3 TABLETS BY MOUTH AT BEDTIME AS NEEDED FOR anxiety/sleep 11/10/22   Inez Catalina, MD  amLODipine (NORVASC) 10 MG tablet Take 1 tablet (10 mg total) by mouth daily. 07/13/22   Shon Hale, MD  ammonium lactate (AMLACTIN) 12 % cream Apply topically. 05/15/22   [provider]  aspirin EC 81 MG tablet Take 1 tablet (81 mg total) by mouth daily with breakfast. 07/13/22   Mariea Clonts, Courage, MD  atorvastatin (LIPITOR) 10 MG tablet Take 1 tablet (10 mg total) by mouth at bedtime. 07/13/22   Shon Hale, MD  benazepril (LOTENSIN) 40 MG tablet Take 1 tablet (40 mg total) by mouth daily. 07/13/22   Shon Hale, MD  bictegravir-emtricitabine-tenofovir AF (BIKTARVY) 50-200-25 MG TABS tablet Take 1 tablet by mouth daily. 12/04/22   Randall Hiss, MD  clopidogrel (PLAVIX) 75 MG tablet Take 1 tablet (75 mg total) by mouth daily. 08/10/22   Shon Hale, MD  diclofenac Sodium (VOLTAREN) 1 % GEL Apply 2 g topically 4 (four) times daily. APPLY TWO GRAMS TO AFFECTED AREA(S) FOUR TIMES DAILY Strength: 1 % 10/31/22   Inez Catalina, MD  dorzolamide-timolol (COSOPT) 2-0.5 % ophthalmic solution Place 1 drop into the right eye 2 (two) times daily. 07/18/22   [provider]  gabapentin (NEURONTIN) 300 MG capsule Take 1 capsule (300 mg total) by mouth 3 (three) times daily. 10/06/22 10/06/23  Inez Catalina, MD  melatonin 5 MG TABS Take 1 tablet (5 mg total) by mouth at bedtime as needed. Patient not taking: Reported on 09/27/2022 08/10/22   Shon Hale, MD  Multiple Vitamin (MULTIVITAMIN WITH MINERALS) TABS Take 1 tablet by mouth daily. Patient not taking: Reported on 09/27/2022    [provider]  oxyCODONE-acetaminophen (PERCOCET/ROXICET) 5-325 MG tablet Take 1 tablet by mouth every 12 (twelve) hours as needed for severe pain. 12/13/22   Inez Catalina, MD  phenylephrine (NEO-SYNEPHRINE) 1 % nasal spray Place 1 drop into both nostrils every 6 (six) hours as needed for congestion. Patient not taking: Reported on 09/27/2022    [provider]  polyethylene glycol (MIRALAX / GLYCOLAX) 17 g packet Take 17 g by mouth See admin instructions. Take 1 dose twice daily for 1 week and then 1 dose daily after that indefinitely 08/10/22   Shon Hale, MD  psyllium (REGULOID) 0.52 g capsule Take 0.52 g by mouth daily.    [provider]  sulfamethoxazole-trimethoprim (BACTRIM DS) 800-160 MG tablet Take 1 tablet by mouth 2 (two) times daily. 10/02/22   Lovie Macadamia, MD  terazosin (HYTRIN) 5 MG capsule Take 5 mg by mouth at bedtime. 11/08/22   [provider]  triamcinolone ointment (KENALOG) 0.1 % Apply topically 2 (two) times daily. to affected area(s) 10/31/22   Inez Catalina, MD    Current Facility-Administered Medications  Medication Dose Route Frequency Provider Last Rate Last Admin   acetaminophen (TYLENOL) tablet 650 mg  650 mg Oral Q6H PRN Adefeso, Oladapo, DO       Or   acetaminophen (TYLENOL) suppository 650 mg  650 mg Rectal Q6H PRN Adefeso, Oladapo, DO       acyclovir (ZOVIRAX) tablet 400 mg  400 mg Oral TID Adefeso, Oladapo, DO       amLODipine (NORVASC) tablet 10 mg  10 mg Oral Daily Adefeso, Oladapo, DO       atorvastatin (LIPITOR) tablet 10 mg  10 mg Oral QHS Adefeso, Oladapo, DO       benazepril (LOTENSIN) tablet 40 mg  40 mg Oral Daily Adefeso, Oladapo, DO       bictegravir-emtricitabine-tenofovir AF (BIKTARVY) 50-200-25 MG per tablet 1 tablet  1 tablet Oral Daily Adefeso, Oladapo, DO        ondansetron (ZOFRAN) tablet 4 mg  4 mg Oral Q6H PRN Adefeso, Oladapo, DO       Or   ondansetron (ZOFRAN) injection 4 mg  4 mg Intravenous Q6H PRN Adefeso, Oladapo, DO       pantoprazole (PROTONIX) injection 40 mg  40 mg Intravenous Q12H Adefeso, Oladapo, DO   40 mg at 12/28/22 0244   terazosin (HYTRIN) capsule 5 mg  5 mg Oral QHS Adefeso, Oladapo, DO       Current Outpatient Medications  Medication Sig Dispense Refill   acyclovir (ZOVIRAX) 400 MG tablet TAKE 1 TABLET BY MOUTH THREE TIMES DAILY AS NEEDED FOR OUTBREAKS FOR 7 DAYS 21 tablet 5   alendronate (FOSAMAX) 70 MG tablet Take 1 tablet (70 mg total) by mouth every Sunday. TAKE 1 TABLET BY MOUTH ONCE WEEKLY ON SUNDAY take with a full glass of water on an empty stomach Strength: 70  mg 12 tablet 3   ALPRAZolam (XANAX) 0.5 MG tablet TAKE 2 TO 3 TABLETS BY MOUTH AT BEDTIME AS NEEDED FOR anxiety/sleep 90 tablet 1   amLODipine (NORVASC) 10 MG tablet Take 1 tablet (10 mg total) by mouth daily. 90 tablet 3   ammonium lactate (AMLACTIN) 12 % cream Apply topically.     aspirin EC 81 MG tablet Take 1 tablet (81 mg total) by mouth daily with breakfast. 30 tablet 5   atorvastatin (LIPITOR) 10 MG tablet Take 1 tablet (10 mg total) by mouth at bedtime. 90 tablet 3   benazepril (LOTENSIN) 40 MG tablet Take 1 tablet (40 mg total) by mouth daily. 90 tablet 3   bictegravir-emtricitabine-tenofovir AF (BIKTARVY) 50-200-25 MG TABS tablet Take 1 tablet by mouth daily. 30 tablet 11   clopidogrel (PLAVIX) 75 MG tablet Take 1 tablet (75 mg total) by mouth daily. 90 tablet 3   diclofenac Sodium (VOLTAREN) 1 % GEL Apply 2 g topically 4 (four) times daily. APPLY TWO GRAMS TO AFFECTED AREA(S) FOUR TIMES DAILY Strength: 1 % 350 g 2   dorzolamide-timolol (COSOPT) 2-0.5 % ophthalmic solution Place 1 drop into the right eye 2 (two) times daily.     gabapentin (NEURONTIN) 300 MG capsule Take 1 capsule (300 mg total) by mouth 3 (three) times daily. 270 capsule 3   melatonin  5 MG TABS Take 1 tablet (5 mg total) by mouth at bedtime as needed. (Patient not taking: Reported on 09/27/2022) 10 tablet 0   Multiple Vitamin (MULTIVITAMIN WITH MINERALS) TABS Take 1 tablet by mouth daily. (Patient not taking: Reported on 09/27/2022)     oxyCODONE-acetaminophen (PERCOCET/ROXICET) 5-325 MG tablet Take 1 tablet by mouth every 12 (twelve) hours as needed for severe pain. 60 tablet 0   phenylephrine (NEO-SYNEPHRINE) 1 % nasal spray Place 1 drop into both nostrils every 6 (six) hours as needed for congestion. (Patient not taking: Reported on 09/27/2022)     polyethylene glycol (MIRALAX / GLYCOLAX) 17 g packet Take 17 g by mouth See admin instructions. Take 1 dose twice daily for 1 week and then 1 dose daily after that indefinitely 45 each 2   psyllium (REGULOID) 0.52 g capsule Take 0.52 g by mouth daily.     sulfamethoxazole-trimethoprim (BACTRIM DS) 800-160 MG tablet Take 1 tablet by mouth 2 (two) times daily. 10 tablet 0   terazosin (HYTRIN) 5 MG capsule Take 5 mg by mouth at bedtime.     triamcinolone ointment (KENALOG) 0.1 % Apply topically 2 (two) times daily. to affected area(s) 454 g 3    Allergies as of 12/27/2022 - Review Complete 12/27/2022  Allergen Reaction Noted   Hctz [hydrochlorothiazide] Other (See Comments) 09/05/2013    Past Medical History:  Diagnosis Date   Acute blood loss anemia 08/08/2020   Acute renal failure (ARF) (HCC) 08/17/2017   Anxiety 04/05/2012   Bilateral sensorineural hearing loss 06/11/2014   Mild to moderate on the left side and slight to mild on the right side per audiometry 05/2014.  Hearing aides with possible masking of tinnitus recommended but patient wished to defer secondary to finances.   Blood transfusion without reported diagnosis    pt denies   Bursitis of right shoulder 07/12/2012   s/p shoulder injection 07/12/2012    Cataract of right eye    REMOVED RIGHT EYE 4-19    Constipation due to pain medication 04/27/2010   Diverticulosis  02/08/2012   Extensive left-sided diverticula on colonoscopy March 2012 per Dr. Jena Gauss  Essential hypertension 07/20/2006   Genital herpes 07/20/2006   Glaucoma of left eye 07/20/2006   Headache 10/20/2019   Heart murmur 1961   Human immunodeficiency virus disease (HCC) 03/27/1986   Hyperlipidemia LDL goal < 100 04/05/2012   Hypokalemia 04/12/2018   Insomnia 10/20/2019   Left hip pain 04/11/2021   Leg pain 04/11/2021   Long-term current use of opiate analgesic 03/17/2016   Loose stools 04/08/2019   Lumbar degenerative disc disease 07/20/2006   With chronic back pain    Marijuana use 07/03/2016   Micturition syncope 09/20/2015   Nausea and vomiting 04/08/2019   Opiate dependence (HCC) 04/12/2018   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   s/p aortobifem bypass 2009    Periumbilical hernia 05/18/2014   1 cm left periumbilical abdominal wall defect   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   DEXA 04/15/2012: L1-L4 spine T -3.9, Right femur T -3.0    Right rotator cuff tear 02/01/2013   Responds to periodic steroid injections   Seborrhea 09/01/2010   Shoulder pain, right 12/04/2017   Small bowel obstruction due to adhesions (HCC) 02/08/2012   s/p Exploratory laparotomy, lysis of adhesions 02/12/12     Subjective tinnitus of both ears 05/18/2014   Tobacco abuse 02/19/2012   Tobacco abuse    Vasovagal syncope 02/15/2015   Visual hallucination 12/04/2022   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 Jamey Ripa; "took out 12  inches of small intestiines and removed blockage"   COLONOSCOPY  2012   EYE SURGERY     EYE SURGERY  06/29/2020   06-29-2020- RIGHT CATARACT REMOVED AND LEFT EYE SURGERY TO REMOVE SAND LIKE SUBSTANCE    FEMORAL ARTERY EXPLORATION Left 07/10/2020   Procedure: REDO LEFT FEMORAL ARTERY EXPOSURE;  Surgeon: Nada Libman, MD;  Location: [facility] OR;  Service: Vascular;  Laterality: Left;   LAPAROTOMY  02/12/2012   Procedure: EXPLORATORY LAPAROTOMY;  Surgeon: Almond Lint, MD;  Location: [facility] OR;  Service: General;  Laterality: N/A;  Exploratory Laparotomy, lysis of adhesions   SMALL INTESTINE SURGERY     THROMBECTOMY FEMORAL ARTERY Left 07/10/2020   Procedure: THROMBECTOMY AORTA-BIFEMORAL GRAFT, PROFUNDA, AND SUPERFICIAL FEMORAL ARTERY  LEFT LEG;  Surgeon: Nada Libman, MD;  Location: [facility] OR;  Service: Vascular;  Laterality: Left;    Family History  Problem Relation Age of Onset   Kidney failure Mother    Diabetes Mother    Hypertension Mother    Heart disease Mother    Glaucoma Father    Congestive Heart Failure Sister    Diabetes Sister    Kidney disease Sister    Diabetes Brother    Unexplained death Brother 45       Automobile accident   Hypothyroidism Daughter    Arthritis Daughter        Neck/Back   Healthy Son    HIV/AIDS Brother    HIV Daughter    Kidney disease Daughter    Arthritis Son  Knee   Colon cancer Neg Hx    Colon polyps Neg Hx    Esophageal cancer Neg Hx    Rectal cancer Neg Hx    Stomach cancer Neg Hx     Social History   Socioeconomic History   Marital status: Widowed    Spouse name: Not on file   Number of children: 4   Years of education: 2y college   Highest education level: Not on file  Occupational History   Occupation: retired    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   Tobacco Use   Smoking status: Every Day    Current packs/day: 0.30    Average packs/day: 0.3 packs/day for 50.0 years (15.0 ttl pk-yrs)    Types:  Cigarettes    Passive exposure: Current   Smokeless tobacco: Never   Tobacco comments:    5 cigs per day  Vaping Use   Vaping status: Never Used  Substance and Sexual Activity   Alcohol use: No    Alcohol/week: 0.0 standard drinks of alcohol    Comment: "last drink of alcohol ~ 1977"   Drug use: No   Sexual activity: Never  Other Topics Concern   Not on file  Social History Narrative   Lives alone in [place], [place]   Social Determinants of Health   Financial Resource Strain: Low Risk  (08/16/2022)   Overall Financial Resource Strain (CARDIA)    Difficulty of Paying Living Expenses: Not hard at all  Food Insecurity: No Food Insecurity (08/16/2022)   Hunger Vital Sign    Worried About Running Out of Food in the Last Year: Never true    Ran Out of Food in the Last Year: Never true  Transportation Needs: No Transportation Needs (08/16/2022)   PRAPARE - Administrator, Civil Service (Medical): No    Lack of Transportation (Non-Medical): No  Physical Activity: Insufficiently Active (08/16/2022)   Exercise Vital Sign    Days of Exercise per Week: 1 day    Minutes of Exercise per Session: 10 min  Stress: Stress Concern Present (08/16/2022)   [employer] of Occupational Health - Occupational Stress Questionnaire    Feeling of Stress : To some extent  Social Connections: Moderately Isolated (08/16/2022)   Social Connection and Isolation Panel [NHANES]    Frequency of Communication with Friends and Family: More than three times a week    Frequency of Social Gatherings with Friends and Family: More than three times a week    Attends Religious Services: More than 4 times per year    Active Member of [employer] or Organizations: No    Attends Banker Meetings: Never    Marital Status: Widowed  Intimate Partner Violence: Not At Risk (08/16/2022)   Humiliation, Afraid, Rape, and Kick questionnaire    Fear of Current or Ex-Partner: No    Emotionally Abused: No    Physically  Abused: No    Sexually Abused: No     Review of System:   General: Negative for anorexia, weight loss, fever, chills, fatigue, weakness. Eyes: Negative for acute vision changes, sees only shadows.  ENT: Negative for hoarseness, difficulty swallowing , nasal congestion. CV: Negative for chest pain, angina, palpitations, dyspnea on exertion, peripheral edema.  Respiratory: Negative for dyspnea at rest, dyspnea on exertion, cough, sputum, wheezing.  GI: See history of present illness. GU:  Negative for dysuria, hematuria, urinary incontinence, urinary frequency, nocturnal urination.  MS: chronic pain Derm: Negative for  rash or itching.  Neuro: Negative for weakness, abnormal sensation, seizure, frequent headaches, memory loss, confusion.  Psych: Negative for anxiety, depression, suicidal ideation, hallucinations.  Endo: Negative for unusual weight change.  Heme: Negative for bruising or bleeding. Allergy: Negative for rash or hives.      Physical Examination:   Vital signs in last 24 hours: Temp:  [98.2 F (36.8 C)-98.3 F (36.8 C)] 98.3 F (36.8 C) (10/17 0252) Pulse Rate:  [69-79] 73 (10/17 0500) Resp:  [9-20] 18 (10/17 0500) BP: (116-182)/(47-80) 132/64 (10/17 0500) SpO2:  [96 %-100 %] 97 % (10/17 0500) Weight:  [56.2 kg] 56.2 kg (10/16 1412)    General: Well-nourished, well-developed in no acute distress.  Head: Normocephalic, atraumatic.   Eyes: Conjunctiva pink, no icterus. Mouth: Oropharyngeal mucosa moist and pink   Neck: Supple without thyromegaly, masses, or lymphadenopathy.  Lungs: Clear to auscultation bilaterally.  Heart: Regular rate and rhythm, no murmurs rubs or gallops.  Abdomen: Bowel sounds are normal, nontender, nondistended, no hepatosplenomegaly or masses, no abdominal bruits or hernia , no rebound or guarding.   Rectal: not performe Extremities: No lower extremity edema, clubbing, deformity.  Neuro: Alert and oriented x 4 , grossly normal  neurologically.  Skin: Warm and dry, no rash or jaundice.   Psych: Alert and cooperative, normal mood and affect.        Intake/Output from previous day: No intake/output data recorded. Intake/Output this shift: No intake/output data recorded.  Lab Results:   CBC Recent Labs    12/27/22 1552 12/28/22 0312  WBC 5.6 4.8  HGB 7.6* 7.1*  HCT 24.9* 23.4*  MCV 92.6 93.6  PLT 335 306   BMET Recent Labs    12/27/22 1552 12/28/22 0312  NA 139 138  K 3.0* 3.4*  CL 111 109  CO2 18* 20*  GLUCOSE 109* 99  BUN 9 10  CREATININE 0.55 0.57  CALCIUM 8.6* 8.5*   LFT Recent Labs    12/27/22 1552 12/28/22 0312  BILITOT 0.5 0.4  ALKPHOS 65 61  AST 17 17  ALT 9 9  PROT 7.3 6.7  ALBUMIN 3.6 3.5    Lipase No results for input(s): "LIPASE" in the last 72 hours.  PT/INR No results for input(s): "LABPROT", "INR" in the last 72 hours.   Hepatitis Panel No results for input(s): "HEPBSAG", "HCVAB", "HEPAIGM", "HEPBIGM" in the last 72 hours.   Imaging Studies:   CT Head Wo Contrast  Result Date: 12/27/2022 CLINICAL DATA:  Altered mental status. EXAM: CT HEAD WITHOUT CONTRAST TECHNIQUE: Contiguous axial images were obtained from the base of the skull through the vertex without intravenous contrast. RADIATION DOSE REDUCTION: This exam was performed according to the departmental dose-optimization program which includes automated exposure control, adjustment of the mA and/or kV according to patient size and/or use of iterative reconstruction technique. COMPARISON:  Aug 09, 2022 FINDINGS: Brain: There is mild cerebral atrophy with widening of the extra-axial spaces and ventricular dilatation. There are areas of decreased attenuation within the white matter tracts of the supratentorial brain, consistent with microvascular disease changes. Vascular: No hyperdense vessel or unexpected calcification. Skull: Normal. Negative for fracture or focal lesion. Sinuses/Orbits: No acute finding. Other:  None. IMPRESSION: 1. Generalized cerebral atrophy with chronic white matter small vessel ischemic changes. 2. No acute intracranial abnormality. Electronically Signed   By: Aram Candela M.D.   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  Patient was incidentally found to have profound anemia and on exam had black stool which was Hemoccult positive.  Anemia/GI bleed: -Hemoglobin 15.14 Jul 2022--> 10.19 November 2022--> 7.6 on admission -On digital rectal exam, stool was black and Hemoccult positive -No prior upper endoscopy.  Remote colonoscopy as outlined above. -She denies any oral NSAID or aspirin use.  She is chronically on Plavix.  -Concern for upper GI bleed given presentation.  Of note patient is not able to see well enough to determine if she has had blood in the stool or melena previously.  Plan:   Initially recommend EGD, to discuss timing with Dr. Tasia Catchings.  I have discussed the risks, alternatives, benefits with regards to but not limited to the risk of reaction to medication, bleeding, infection, perforation and the patient is agreeable to proceed. Written consent to be obtained. Pantoprazole 40mg  IV Q12 hours.  F/u anemia labs. Transfuse to keep Hgb above 7.   LOS: 0 days   We would like to thank you for the opportunity to participate in the care of Mckelle Redford.  Leanna Battles. [place] [facility]os [facility] [phone] 10/17/20248:23 AM

## 2022-12-28 NOTE — ED Notes (Signed)
Attempted to call report.  Nurse unavailable.

## 2022-12-28 NOTE — ED Notes (Signed)
Family at bedside. 

## 2022-12-28 NOTE — Progress Notes (Signed)
   12/28/22 1039  TOC Brief Assessment  Insurance and Status Reviewed  Patient has primary care physician Yes  Home environment has been reviewed from home  Prior level of function: independent  Prior/Current Home Services No current home services  Social Determinants of Health Reivew SDOH reviewed no interventions necessary  Readmission risk has been reviewed Yes  Transition of care needs no transition of care needs at this time    Transition of Care Department [facility]) has reviewed patient and no TOC needs have been identified at this time. We will continue to monitor patient advancement through interdisciplinary progression rounds. If new patient transition needs arise, please place a TOC consult.

## 2022-12-28 NOTE — Progress Notes (Signed)
Patient underwent EGD under propofol sedation.  Tolerated the procedure adequately.   FINDINGS:  - Food in the middle third of the esophagus gently pushed down in the stomach  - A small amount of food ( residue) in the stomach. . No evidence of old or active bleeding  - Gastritis. Biopsied.  -Normal duodenal bulb and second portion of the duodenum.  RECOMMENDATIONS  - Await pathology results.  - Esophagram as outpatient given food content seen on EGD in esophagus to assess motility  - Consider IV iron loading while inpatient for severe IDA ( Ferritin: 9)  - Prepare for colonoscopy as upper endoscopy did not explain cause of severe Iron deficiency anemia  - Clear liquid idet today  - Npo past midnight  Vista Lawman, MD Gastroenterology and Hepatology [facility]

## 2022-12-29 ENCOUNTER — Observation Stay (HOSPITAL_BASED_OUTPATIENT_CLINIC_OR_DEPARTMENT_OTHER): Payer: Medicare Other | Admitting: Anesthesiology

## 2022-12-29 ENCOUNTER — Observation Stay (HOSPITAL_COMMUNITY): Payer: Medicare Other | Admitting: Anesthesiology

## 2022-12-29 ENCOUNTER — Encounter (HOSPITAL_COMMUNITY)
Admission: EM | Disposition: A | Discharge status: Home / Self Care | Payer: Self-pay | Source: Home / Self Care | Attending: Emergency Medicine

## 2022-12-29 DIAGNOSIS — K921 Melena: Secondary | ICD-10-CM

## 2022-12-29 DIAGNOSIS — K5730 Diverticulosis of large intestine without perforation or abscess without bleeding: Secondary | ICD-10-CM

## 2022-12-29 DIAGNOSIS — D649 Anemia, unspecified: Secondary | ICD-10-CM | POA: Diagnosis not present

## 2022-12-29 DIAGNOSIS — K2970 Gastritis, unspecified, without bleeding: Secondary | ICD-10-CM | POA: Diagnosis not present

## 2022-12-29 DIAGNOSIS — K2990 Gastroduodenitis, unspecified, without bleeding: Secondary | ICD-10-CM | POA: Diagnosis not present

## 2022-12-29 DIAGNOSIS — D509 Iron deficiency anemia, unspecified: Secondary | ICD-10-CM

## 2022-12-29 DIAGNOSIS — E876 Hypokalemia: Secondary | ICD-10-CM | POA: Diagnosis not present

## 2022-12-29 DIAGNOSIS — R209 Unspecified disturbances of skin sensation: Secondary | ICD-10-CM | POA: Diagnosis not present

## 2022-12-29 DIAGNOSIS — Z21 Asymptomatic human immunodeficiency virus [HIV] infection status: Secondary | ICD-10-CM | POA: Diagnosis not present

## 2022-12-29 DIAGNOSIS — Q438 Other specified congenital malformations of intestine: Secondary | ICD-10-CM | POA: Diagnosis not present

## 2022-12-29 HISTORY — PX: COLONOSCOPY WITH PROPOFOL: SHX5780

## 2022-12-29 LAB — BASIC METABOLIC PANEL
Anion gap: 8 (ref 5–15)
BUN: 8 mg/dL (ref 8–23)
CO2: 21 mmol/L — ABNORMAL LOW (ref 22–32)
Calcium: 8.2 mg/dL — ABNORMAL LOW (ref 8.9–10.3)
Chloride: 107 mmol/L (ref 98–111)
Creatinine, Ser: 0.63 mg/dL (ref 0.44–1.00)
GFR, Estimated: >60 mL/min (ref >60)
Glucose, Bld: 117 mg/dL — ABNORMAL HIGH (ref 70–99)
Potassium: 3.3 mmol/L — ABNORMAL LOW (ref 3.5–5.1)
Sodium: 136 mmol/L (ref 135–145)

## 2022-12-29 LAB — CBC
HCT: 19.2 % — ABNORMAL LOW (ref 36.0–46.0)
Hemoglobin: 5.8 g/dL — CL (ref 12.0–15.0)
MCH: 27.6 pg (ref 26.0–34.0)
MCHC: 30.2 g/dL (ref 30.0–36.0)
MCV: 91.4 fL (ref 80.0–100.0)
Platelets: 253 10*3/uL (ref 150–400)
RBC: 2.1 MIL/uL — ABNORMAL LOW (ref 3.87–5.11)
RDW: 14.5 % (ref 11.5–15.5)
WBC: 4.3 10*3/uL (ref 4.0–10.5)
nRBC: 0 % (ref 0.0–0.2)

## 2022-12-29 LAB — T-HELPER CELLS (CD4) COUNT (NOT AT ARMC)
CD4 % Helper T Cell: 50 % (ref 33–65)
CD4 T Cell Abs: 1580 /uL (ref 400–1790)

## 2022-12-29 LAB — ABO/RH: ABO/RH(D): O POS

## 2022-12-29 LAB — PREPARE RBC (CROSSMATCH)

## 2022-12-29 LAB — RPR: RPR Ser Ql: NONREACTIVE

## 2022-12-29 SURGERY — COLONOSCOPY WITH PROPOFOL
Anesthesia: General

## 2022-12-29 MED ORDER — SODIUM CHLORIDE 0.9% IV SOLUTION: Freq: Once | INTRAVENOUS | Status: AC

## 2022-12-29 MED ORDER — ORAL CARE MOUTH RINSE: 15.0000 mL | OROMUCOSAL | Status: DC | PRN

## 2022-12-29 MED ORDER — LIDOCAINE HCL (CARDIAC) PF 100 MG/5ML IV SOSY
PREFILLED_SYRINGE | INTRAVENOUS | Status: DC | PRN
  Administered 2022-12-29: 60 mg via INTRAVENOUS

## 2022-12-29 MED ORDER — PROPOFOL 10 MG/ML IV BOLUS
INTRAVENOUS | Status: DC | PRN
  Administered 2022-12-29: 30 mg via INTRAVENOUS
  Administered 2022-12-29: 40 mg via INTRAVENOUS
  Administered 2022-12-29: 20 mg via INTRAVENOUS
  Administered 2022-12-29 (×3): 40 mg via INTRAVENOUS

## 2022-12-29 MED ORDER — PROPOFOL 500 MG/50ML IV EMUL
INTRAVENOUS | Status: DC | PRN
  Administered 2022-12-29: 50 ug/kg/min via INTRAVENOUS
  Administered 2022-12-29: 75 ug/kg/min via INTRAVENOUS

## 2022-12-29 MED ORDER — LACTATED RINGERS IV SOLN: INTRAVENOUS | Status: DC

## 2022-12-29 MED ORDER — POTASSIUM CHLORIDE 10 MEQ/100ML IV SOLN
10.0000 meq | INTRAVENOUS | Status: AC
  Administered 2022-12-29 (×3): 10 meq via INTRAVENOUS
  Filled 2022-12-29 (×3): qty 100

## 2022-12-29 NOTE — Transfer of Care (Signed)
Immediate Anesthesia Transfer of Care Note  Patient: Jessica [name]  Procedure(s) Performed: COLONOSCOPY WITH PROPOFOL  Patient Location: PACU  Anesthesia Type:General  Level of Consciousness: awake and patient cooperative  Airway & Oxygen Therapy: Patient Spontanous Breathing  Post-op Assessment: Report given to RN and Post -op Vital signs reviewed and stable  Post vital signs: Reviewed and stable  Last Vitals:  Vitals Value Taken Time  BP 123/56 12/29/22 1526  Temp 98.4 12/29/22 1528  Pulse 91 12/29/22 1523  Resp 18 12/29/22 1528  SpO2 100 % 12/29/22 1523  Vitals shown include unfiled device data.  Last Pain:  Vitals:   12/29/22 1438  TempSrc:   PainSc: 0-No pain      Patients Stated Pain Goal: 0 (12/28/22 1635)  Complications: No notable events documented.

## 2022-12-29 NOTE — Anesthesia Preprocedure Evaluation (Signed)
Anesthesia Evaluation  Patient identified by MRN, date of birth, ID band Patient awake    Reviewed: Allergy & Precautions, H&P , NPO status , Patient's Chart, lab work & pertinent test results, reviewed documented beta blocker date and time   Airway Mallampati: II  TM Distance: >3 FB Neck ROM: full    Dental no notable dental hx.    Pulmonary neg pulmonary ROS, Current Smoker   Pulmonary exam normal breath sounds clear to auscultation       Cardiovascular Exercise Tolerance: Good hypertension, + Peripheral Vascular Disease  + Valvular Problems/Murmurs  Rhythm:regular Rate:Normal     Neuro/Psych  Headaches PSYCHIATRIC DISORDERS Anxiety      Neuromuscular disease    GI/Hepatic negative GI ROS, Neg liver ROS,,,  Endo/Other  negative endocrine ROS    Renal/GU Renal disease  negative genitourinary   Musculoskeletal   Abdominal   Peds  Hematology  (+) Blood dyscrasia, anemia   Anesthesia Other Findings   Reproductive/Obstetrics negative OB ROS                             Anesthesia Physical Anesthesia Plan  ASA: 4 and emergent  Anesthesia Plan: General   Post-op Pain Management:    Induction:   PONV Risk Score and Plan: Propofol infusion  Airway Management Planned:   Additional Equipment:   Intra-op Plan:   Post-operative Plan:   Informed Consent: I have reviewed the patients History and Physical, chart, labs and discussed the procedure including the risks, benefits and alternatives for the proposed anesthesia with the patient or authorized representative who has indicated his/her understanding and acceptance.     Dental Advisory Given  Plan Discussed with: CRNA  Anesthesia Plan Comments:        Anesthesia Quick Evaluation
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
## 2022-12-29 NOTE — Op Note (Addendum)
[facility] Patient Name: Jessica [name] [facility] Procedure Date: 12/29/2022 2:31 PM MRN: [id number] Date of Birth: 1941/02/13 Attending MD: Katrinka Blazing , , [id number] CSN: [id number] Age: 82 Admit Type: Inpatient Procedure:                Colonoscopy Indications:              Melena Providers:                Katrinka Blazing, Buel Ream. Thomasena Edis RN, RN, Lennice Sites Technician, Pensions consultant Referring MD:              Medicines:                Monitored Anesthesia Care Complications:            No immediate complications. Estimated Blood Loss:     Estimated blood loss: none. Procedure:                Pre-Anesthesia Assessment:                           - Prior to the procedure, a History and Physical                            was performed, and patient medications, allergies                            and sensitivities were reviewed. The patient's                            tolerance of previous anesthesia was reviewed.                           - The risks and benefits of the procedure and the                            sedation options and risks were discussed with the                            patient. All questions were answered and informed                            consent was obtained.                           - ASA Grade Assessment: III - A patient with severe                            systemic disease.                           After obtaining informed consent, the colonoscope                            was passed under direct vision. Throughout the  procedure, the patient's blood pressure, pulse, and                            oxygen saturations were monitored continuously. The                            [id number]-[id number] ([id number]) scope was introduced through                            the anus and advanced to the the terminal ileum.                            The colonoscopy was performed without difficulty.                             The patient tolerated the procedure well. The                            quality of the bowel preparation was adequate to                            identify polyps greater than 5 mm in size. Scope In: 2:48:29 PM Scope Out: 3:16:14 PM Scope Withdrawal Time: 0 hours 12 minutes 19 seconds  Total Procedure Duration: 0 hours 27 minutes 45 seconds  Findings:      The perianal and digital rectal examinations were normal.      The terminal ileum appeared normal.      Multiple large-mouthed and small-mouthed diverticula were found in the       sigmoid colon and descending colon.      The colon (entire examined portion) was moderately tortuous. Most of the       stool in the lumen was dark brown, with very scant amount with blood       tinge.      The retroflexed view of the distal rectum and anal verge was normal and       showed no anal or rectal abnormalities. Impression:               - The examined portion of the ileum was normal.                           - Diverticulosis in the sigmoid colon and in the                            descending colon.                           - Tortuous colon.                           - The distal rectum and anal verge are normal on                            retroflexion view.                           -  No specimens collected. Moderate Sedation:      Per Anesthesia Care Recommendation:           - Resume previous diet. NPO after midnight.                           - Repeat colonoscopy is not recommended due to                            current age (49 years or older) for screening                            purposes.                           - Daily H/H.                           - Proceed with capsule endoscopy tomorrow AM. Procedure Code(s):        --- Professional ---                           [phone], Colonoscopy, flexible; diagnostic, including                            collection of specimen(s) by brushing or washing,                             when performed (separate procedure) Diagnosis Code(s):        --- Professional ---                           K92.1, Melena (includes Hematochezia)                           K57.30, Diverticulosis of large intestine without                            perforation or abscess without bleeding                           Q43.8, Other specified congenital malformations of                            intestine CPT copyright 2022 American Medical Association. All rights reserved. The codes documented in this report are preliminary and upon coder review may  be revised to meet current compliance requirements. Katrinka Blazing, MD Katrinka Blazing,  12/29/2022 3:28:34 PM This report has been signed electronically. Number of Addenda: 0

## 2022-12-29 NOTE — Anesthesia Procedure Notes (Signed)
Date/Time: 12/29/2022 2:40 PM  Performed by: Franco Nones, CRNAPre-anesthesia Checklist: Patient identified, Emergency Drugs available, Suction available, Timeout performed and Patient being monitored Patient Re-evaluated:Patient Re-evaluated prior to induction Oxygen Delivery Method: Nasal Cannula

## 2022-12-29 NOTE — Anesthesia Postprocedure Evaluation (Signed)
Anesthesia Post Note  Patient: Jessica [name]  Procedure(s) Performed: ESOPHAGOGASTRODUODENOSCOPY (EGD) WITH PROPOFOL BIOPSY  Patient location during evaluation: Nursing Unit Anesthesia Type: General Level of consciousness: awake and alert Pain management: pain level controlled Vital Signs Assessment: post-procedure vital signs reviewed and stable Respiratory status: spontaneous breathing Cardiovascular status: blood pressure returned to baseline and stable Postop Assessment: no apparent nausea or vomiting Anesthetic complications: no   No notable events documented.   Last Vitals:  Vitals:   12/29/22 1256 12/29/22 1309  BP: (!) 162/64 (!) 162/64  Pulse: 82 82  Resp: 16 16  Temp: 37 C 37 C  SpO2:      Last Pain:  Vitals:   12/29/22 1309  TempSrc: Oral  PainSc:                  Glynn Octave

## 2022-12-29 NOTE — Plan of Care (Signed)

## 2022-12-29 NOTE — Care Management Obs Status (Signed)
MEDICARE OBSERVATION STATUS NOTIFICATION   Patient Details  Name: Jessica [name] MRN: [id number] Date of Birth: 14-Jul-1940   Medicare Observation Status Notification Given:  Yes    Corey Harold 12/29/2022, 9:18 AM

## 2022-12-29 NOTE — Plan of Care (Signed)
[facility] Tonsillectomy/Adenoidectomy, Postoperative PEDS care plan entered in error.

## 2022-12-29 NOTE — Progress Notes (Signed)
We will proceed with colonoscopy as scheduled.  I thoroughly discussed with the patient the procedure, including the risks involved. Patient understands what the procedure involves including the benefits and any risks. Patient understands alternatives to the proposed procedure. Risks including (but not limited to) bleeding, tearing of the lining (perforation), rupture of adjacent organs, problems with heart and lung function, infection, and medication reactions. A small percentage of complications may require surgery, hospitalization, repeat endoscopic procedure, and/or transfusion.  Patient understood and agreed.  Maylon Peppers, MD Gastroenterology and Hepatology [facility]

## 2022-12-29 NOTE — Brief Op Note (Addendum)
12/29/2022  3:27 PM  PATIENT:  Jessica [name]  82 y.o. female  PRE-OPERATIVE DIAGNOSIS:  Severe iron deficiency anemia  POST-OPERATIVE DIAGNOSIS:  diverticulosis  PROCEDURE:  Procedure(s): COLONOSCOPY WITH PROPOFOL (N/A)  SURGEON:  Surgeons and Role:    * Dolores Frame, MD - Primary  Patient underwent colonoscopy under propofol sedation.  Tolerated the procedure adequately.   FINDINGS: - The examined portion of the ileum was normal.  - Diverticulosis in the sigmoid colon and in the descending colon.  - Tortuous colon.  Most of the stool in the lumen was dark brown, with very scant amount with blood tinge. - The distal rectum and anal verge are normal on retroflexion view.   RECOMMENDATIONS - Resume previous diet. NPO after midnight. - Repeat colonoscopy is not recommended due to current age (71 years or older) for screening purposes.  - Daily H/H. - Proceed with capsule endoscopy tomorrow AM.  Katrinka Blazing, MD Gastroenterology and Hepatology [facility]

## 2022-12-29 NOTE — Plan of Care (Signed)
  Problem: Education: Goal: Knowledge of General Education information will improve Description: Including pain rating scale, medication(s)/side effects and non-pharmacologic comfort measures Outcome: Not Met (add Reason)   Problem: Health Behavior/Discharge Planning: Goal: Ability to manage health-related needs will improve Outcome: Not Met (add Reason)   Problem: Clinical Measurements: Goal: Ability to maintain clinical measurements within normal limits will improve Outcome: Progressing Goal: Diagnostic test results will improve Outcome: Not Met (add Reason) Goal: Respiratory complications will improve Outcome: Progressing   Problem: Activity: Goal: Risk for activity intolerance will decrease Outcome: Not Met (add Reason)

## 2022-12-30 ENCOUNTER — Encounter (HOSPITAL_COMMUNITY)
Admission: EM | Disposition: A | Discharge status: Home / Self Care | Payer: Self-pay | Source: Home / Self Care | Attending: Emergency Medicine

## 2022-12-30 DIAGNOSIS — R209 Unspecified disturbances of skin sensation: Secondary | ICD-10-CM | POA: Diagnosis not present

## 2022-12-30 DIAGNOSIS — Z21 Asymptomatic human immunodeficiency virus [HIV] infection status: Secondary | ICD-10-CM | POA: Diagnosis not present

## 2022-12-30 DIAGNOSIS — D649 Anemia, unspecified: Secondary | ICD-10-CM | POA: Diagnosis not present

## 2022-12-30 DIAGNOSIS — K5730 Diverticulosis of large intestine without perforation or abscess without bleeding: Secondary | ICD-10-CM | POA: Diagnosis not present

## 2022-12-30 DIAGNOSIS — E876 Hypokalemia: Secondary | ICD-10-CM | POA: Diagnosis not present

## 2022-12-30 DIAGNOSIS — D509 Iron deficiency anemia, unspecified: Secondary | ICD-10-CM | POA: Diagnosis not present

## 2022-12-30 DIAGNOSIS — K2970 Gastritis, unspecified, without bleeding: Secondary | ICD-10-CM | POA: Diagnosis not present

## 2022-12-30 DIAGNOSIS — K2990 Gastroduodenitis, unspecified, without bleeding: Secondary | ICD-10-CM | POA: Diagnosis not present

## 2022-12-30 DIAGNOSIS — K921 Melena: Secondary | ICD-10-CM | POA: Diagnosis not present

## 2022-12-30 HISTORY — PX: GIVENS CAPSULE STUDY: SHX5432

## 2022-12-30 LAB — TYPE AND SCREEN
ABO/RH(D): O POS
Antibody Screen: NEGATIVE
Unit division: 0
Unit division: 0

## 2022-12-30 LAB — BPAM RBC
Blood Product Expiration Date: 202411162359
Blood Product Expiration Date: 202411192359
ISSUE DATE / TIME: 202410181109
ISSUE DATE / TIME: 202410181306
Unit Type and Rh: 5100
Unit Type and Rh: 5100

## 2022-12-30 LAB — BASIC METABOLIC PANEL
Anion gap: 8 (ref 5–15)
BUN: <5 mg/dL — ABNORMAL LOW (ref 8–23)
CO2: 21 mmol/L — ABNORMAL LOW (ref 22–32)
Calcium: 8.7 mg/dL — ABNORMAL LOW (ref 8.9–10.3)
Chloride: 109 mmol/L (ref 98–111)
Creatinine, Ser: 0.66 mg/dL (ref 0.44–1.00)
GFR, Estimated: >60 mL/min (ref >60)
Glucose, Bld: 98 mg/dL (ref 70–99)
Potassium: 3.2 mmol/L — ABNORMAL LOW (ref 3.5–5.1)
Sodium: 138 mmol/L (ref 135–145)

## 2022-12-30 LAB — CBC
HCT: 30 % — ABNORMAL LOW (ref 36.0–46.0)
Hemoglobin: 9.8 g/dL — ABNORMAL LOW (ref 12.0–15.0)
MCH: 28.3 pg (ref 26.0–34.0)
MCHC: 32.7 g/dL (ref 30.0–36.0)
MCV: 86.7 fL (ref 80.0–100.0)
Platelets: 274 10*3/uL (ref 150–400)
RBC: 3.46 MIL/uL — ABNORMAL LOW (ref 3.87–5.11)
RDW: 15.3 % (ref 11.5–15.5)
WBC: 4.5 10*3/uL (ref 4.0–10.5)
nRBC: 0 % (ref 0.0–0.2)

## 2022-12-30 LAB — MAGNESIUM: Magnesium: 1.9 mg/dL (ref 1.7–2.4)

## 2022-12-30 SURGERY — IMAGING PROCEDURE, GI TRACT, INTRALUMINAL, VIA CAPSULE

## 2022-12-30 MED ORDER — ALPRAZOLAM 0.25 MG PO TABS: 0.2500 mg | ORAL_TABLET | Freq: Three times a day (TID) | ORAL | Status: DC | PRN

## 2022-12-30 MED ORDER — POTASSIUM CHLORIDE CRYS ER 20 MEQ PO TBCR
40.0000 meq | EXTENDED_RELEASE_TABLET | Freq: Once | ORAL | Status: AC
  Administered 2022-12-30: 40 meq via ORAL
  Filled 2022-12-30: qty 2

## 2022-12-30 NOTE — Progress Notes (Signed)
Katrinka Blazing, M.D. Gastroenterology & Hepatology   Interval History:  No acute events overnight.  Patient reports feeling well.  No reports of melena or hematochezia per nursing staff. Labs showed hemoglobin of 9.8 after receiving 2 units of PRBC yesterday.  Inpatient Medications:  Current Facility-Administered Medications:    acetaminophen (TYLENOL) tablet 650 mg, 650 mg, Oral, Q6H PRN **OR** acetaminophen (TYLENOL) suppository 650 mg, 650 mg, Rectal, Q6H PRN, Adefeso, Oladapo, DO   acetaminophen (TYLENOL) tablet 1,000 mg, 1,000 mg, Oral, Q8H, Tat, David, MD, 1,000 mg at 12/30/22 0417   acyclovir (ZOVIRAX) tablet 400 mg, 400 mg, Oral, TID, Adefeso, Oladapo, DO, 400 mg at 12/29/22 2200   amLODipine (NORVASC) tablet 10 mg, 10 mg, Oral, Daily, Adefeso, Oladapo, DO, 10 mg at 12/28/22 0947   atorvastatin (LIPITOR) tablet 10 mg, 10 mg, Oral, QHS, Adefeso, Oladapo, DO, 10 mg at 12/29/22 2200   benazepril (LOTENSIN) tablet 40 mg, 40 mg, Oral, Daily, Adefeso, Oladapo, DO, 40 mg at 12/28/22 0947   bictegravir-emtricitabine-tenofovir AF (BIKTARVY) 50-200-25 MG per tablet 1 tablet, 1 tablet, Oral, Daily, Adefeso, Oladapo, DO, 1 tablet at 12/28/22 0955   dorzolamide-timolol (COSOPT) 2-0.5 % ophthalmic solution 1 drop, 1 drop, Right Eye, BID, Tat, David, MD, 1 drop at 12/30/22 0401   ondansetron (ZOFRAN) tablet 4 mg, 4 mg, Oral, Q6H PRN **OR** ondansetron (ZOFRAN) injection 4 mg, 4 mg, Intravenous, Q6H PRN, Adefeso, Oladapo, DO   Oral care mouth rinse, 15 mL, Mouth Rinse, PRN, Tat, David, MD   pantoprazole (PROTONIX) injection 40 mg, 40 mg, Intravenous, Q12H, Adefeso, Oladapo, DO, 40 mg at 12/29/22 2200   terazosin (HYTRIN) capsule 5 mg, 5 mg, Oral, QHS, Adefeso, Oladapo, DO, 5 mg at 12/29/22 2201   I/O    Intake/Output Summary (Last 24 hours) at 12/30/2022 0857 Last data filed at 12/29/2022 2200 Gross per 24 hour  Intake 849.17 ml  Output --  Net 849.17 ml     Physical Exam: Temp:  [98.1  F (36.7 C)-98.6 F (37 C)] 98.1 F (36.7 C) (10/19 0356) Pulse Rate:  [70-92] 70 (10/19 0356) Resp:  [11-23] 16 (10/19 0356) BP: (127-178)/(62-115) 163/69 (10/19 0356) SpO2:  [94 %-100 %] 94 % (10/19 0356)  Temp (24hrs), Avg:98.4 F (36.9 C), Min:98.1 F (36.7 C), Max:98.6 F (37 C) GENERAL: The patient is AO x3, in no acute distress. HEENT: Head is normocephalic and atraumatic. EOMI are intact. Mouth is well hydrated and without lesions. NECK: Supple. No masses LUNGS: Clear to auscultation. No presence of rhonchi/wheezing/rales. Adequate chest expansion HEART: RRR, normal s1 and s2. ABDOMEN: Soft, nontender, no guarding, no peritoneal signs, and nondistended. BS +. No masses. EXTREMITIES: Without any cyanosis, clubbing, rash, lesions or edema. NEUROLOGIC: AOx3, no focal motor deficit. SKIN: no jaundice, no rashes  Laboratory Data: CBC:     Component Value Date/Time   WBC 4.5 12/30/2022 0349   RBC 3.46 (L) 12/30/2022 0349   HGB 9.8 (L) 12/30/2022 0349   HGB 12.9 12/30/2021 0946   HCT 30.0 (L) 12/30/2022 0349   HCT 37.6 12/30/2021 0946   PLT 274 12/30/2022 0349   PLT 318 12/30/2021 0946   MCV 86.7 12/30/2022 0349   MCV 97 12/30/2021 0946   MCH 28.3 12/30/2022 0349   MCHC 32.7 12/30/2022 0349   RDW 15.3 12/30/2022 0349   RDW 12.1 12/30/2021 0946   LYMPHSABS 2.7 12/27/2022 1552   LYMPHSABS 3.3 (H) 08/04/2020 1613   MONOABS 0.3 12/27/2022 1552   EOSABS 0.1 12/27/2022 1552  EOSABS 0.4 08/04/2020 1613   BASOSABS 0.1 12/27/2022 1552   BASOSABS 0.1 08/04/2020 1613   COAG:  Lab Results  Component Value Date   INR 1.2 07/10/2020   INR 0.97 02/08/2012   INR 1.1 05/08/2007    BMP:     Latest Ref Rng & Units 12/30/2022    3:49 AM 12/29/2022    4:19 AM 12/28/2022    3:12 AM  BMP  Glucose 70 - 99 mg/dL 98  956  99   BUN 8 - 23 mg/dL 5  8  10    Creatinine 0.44 - 1.00 mg/dL 2.13  0.86  5.78   Sodium 135 - 145 mmol/L 138  136  138   Potassium 3.5 - 5.1 mmol/L 3.2   3.3  3.4   Chloride 98 - 111 mmol/L 109  107  109   CO2 22 - 32 mmol/L 21  21  20    Calcium 8.9 - 10.3 mg/dL 8.7  8.2  8.5     HEPATIC:     Latest Ref Rng & Units 12/28/2022    3:12 AM 12/27/2022    3:52 PM 12/04/2022    1:45 PM  Hepatic Function  Total Protein 6.5 - 8.1 g/dL 6.7  7.3  7.1   Albumin 3.5 - 5.0 g/dL 3.5  3.6    AST 15 - 41 U/L 17  17  12    ALT 0 - 44 U/L 9  9  4    Alk Phosphatase 38 - 126 U/L 61  65    Total Bilirubin 0.3 - 1.2 mg/dL 0.4  0.5  0.3     CARDIAC:  Lab Results  Component Value Date   CKTOTAL 27 (L) 12/04/2017   CKMB 6.4 (HH) 02/08/2012   TROPONINI <0.03 11/12/2017      Imaging: I personally reviewed and interpreted the available labs, imaging and endoscopic files.   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  She was found to have profound anemia with presence of possible melena.  In the ER, she was found to have hemoglobin of 7.6.  Iron stores were severely depleted with a ferritin of 9, saturation 4% and total iron of 18.  Due to this, patient underwent EGD and colonoscopy during the current admission.  Esophagogastroduodenospy showed nodularity in the stomach and small mount of food in the esophagus and body.  Colonoscopy showed diverticulosis and scant amount of dark brown stool but no presence of active bleeding or any source of recent bleeding.  Patient required transfusion of 2 units of PRBC and has responded adequately to this.  As the etiology of the severe iron deficiency anemia is unclear, we will proceed with a capsule endoscopy today.  Patient agrees and understands this.  -Daily H&H -Follow gastric biopsies pathology -Follow capsule endoscopy  report -Will need to be discharged on oral iron supplementation ferrous sulfate 325 mg at least once a day.  Katrinka Blazing, MD Gastroenterology and Hepatology [facility]
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
## 2022-12-30 NOTE — Plan of Care (Signed)

## 2022-12-31 ENCOUNTER — Telehealth (INDEPENDENT_AMBULATORY_CARE_PROVIDER_SITE_OTHER): Payer: Self-pay | Admitting: Gastroenterology

## 2022-12-31 DIAGNOSIS — Z21 Asymptomatic human immunodeficiency virus [HIV] infection status: Secondary | ICD-10-CM | POA: Diagnosis not present

## 2022-12-31 DIAGNOSIS — D649 Anemia, unspecified: Secondary | ICD-10-CM | POA: Diagnosis not present

## 2022-12-31 DIAGNOSIS — R209 Unspecified disturbances of skin sensation: Secondary | ICD-10-CM

## 2022-12-31 DIAGNOSIS — K2970 Gastritis, unspecified, without bleeding: Secondary | ICD-10-CM | POA: Diagnosis not present

## 2022-12-31 DIAGNOSIS — E876 Hypokalemia: Secondary | ICD-10-CM | POA: Diagnosis not present

## 2022-12-31 DIAGNOSIS — D509 Iron deficiency anemia, unspecified: Secondary | ICD-10-CM | POA: Diagnosis not present

## 2022-12-31 DIAGNOSIS — K2990 Gastroduodenitis, unspecified, without bleeding: Secondary | ICD-10-CM | POA: Diagnosis not present

## 2022-12-31 DIAGNOSIS — R441 Visual hallucinations: Secondary | ICD-10-CM | POA: Diagnosis not present

## 2022-12-31 DIAGNOSIS — K5730 Diverticulosis of large intestine without perforation or abscess without bleeding: Secondary | ICD-10-CM | POA: Diagnosis not present

## 2022-12-31 LAB — BASIC METABOLIC PANEL
Anion gap: 7 (ref 5–15)
BUN: 7 mg/dL — ABNORMAL LOW (ref 8–23)
CO2: 20 mmol/L — ABNORMAL LOW (ref 22–32)
Calcium: 8.8 mg/dL — ABNORMAL LOW (ref 8.9–10.3)
Chloride: 110 mmol/L (ref 98–111)
Creatinine, Ser: 0.71 mg/dL (ref 0.44–1.00)
GFR, Estimated: >60 mL/min (ref >60)
Glucose, Bld: 91 mg/dL (ref 70–99)
Potassium: 3.4 mmol/L — ABNORMAL LOW (ref 3.5–5.1)
Sodium: 137 mmol/L (ref 135–145)

## 2022-12-31 LAB — CBC
HCT: 31.3 % — ABNORMAL LOW (ref 36.0–46.0)
Hemoglobin: 9.9 g/dL — ABNORMAL LOW (ref 12.0–15.0)
MCH: 27.9 pg (ref 26.0–34.0)
MCHC: 31.6 g/dL (ref 30.0–36.0)
MCV: 88.2 fL (ref 80.0–100.0)
Platelets: 265 10*3/uL (ref 150–400)
RBC: 3.55 MIL/uL — ABNORMAL LOW (ref 3.87–5.11)
RDW: 14.8 % (ref 11.5–15.5)
WBC: 4.5 10*3/uL (ref 4.0–10.5)
nRBC: 0 % (ref 0.0–0.2)

## 2022-12-31 MED ORDER — FERROUS SULFATE 325 (65 FE) MG PO TABS: 325.0000 mg | ORAL_TABLET | Freq: Every day | ORAL | Status: AC

## 2022-12-31 MED ORDER — FERROUS SULFATE 325 (65 FE) MG PO TABS: 325.0000 mg | ORAL_TABLET | Freq: Every day | ORAL | Status: DC

## 2022-12-31 MED ORDER — POTASSIUM CHLORIDE CRYS ER 20 MEQ PO TBCR
40.0000 meq | EXTENDED_RELEASE_TABLET | Freq: Once | ORAL | Status: AC
  Administered 2022-12-31: 40 meq via ORAL
  Filled 2022-12-31: qty 2
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
## 2022-12-31 NOTE — Telephone Encounter (Signed)
Hi Mitzie,  Can you please schedule a follow up appointment for this patient in 3-4 weeks with Dr. Tasia Catchings or any of the APPs?  Thanks,  Katrinka Blazing, MD Gastroenterology and Hepatology [facility] Gastroenterology

## 2022-12-31 NOTE — Plan of Care (Signed)

## 2022-12-31 NOTE — Anesthesia Postprocedure Evaluation (Signed)
Anesthesia Post Note  Patient: Jessica [name]  Procedure(s) Performed: COLONOSCOPY WITH PROPOFOL  Patient location during evaluation: Phase II Anesthesia Type: General Level of consciousness: awake Pain management: pain level controlled Vital Signs Assessment: post-procedure vital signs reviewed and stable Respiratory status: spontaneous breathing and respiratory function stable Cardiovascular status: blood pressure returned to baseline and stable Postop Assessment: no headache and no apparent nausea or vomiting Anesthetic complications: no Comments: Late entry   No notable events documented.   Last Vitals:  Vitals:   12/30/22 2006 12/31/22 0506  BP: 102/72 (!) 153/67  Pulse: 65 61  Resp: 16 20  Temp: 36.6 C 36.8 C  SpO2: 99% 99%    Last Pain:  Vitals:   12/30/22 2300  TempSrc:   PainSc: 0-No pain                 Windell Norfolk

## 2022-12-31 NOTE — Progress Notes (Signed)
Nsg Discharge Note  Admit Date:  12/27/2022 Discharge date: 12/31/2022   Jessica [name] to be D/C'd Home per MD order.  AVS completed.   Discharge Medication: Allergies as of 12/31/2022       Reactions   Hctz [hydrochlorothiazide] Other (See Comments)   Dizziness, syncope; does NOT wish to take anymore        Medication List     TAKE these medications    acyclovir 400 MG tablet Commonly known as: ZOVIRAX TAKE 1 TABLET BY MOUTH THREE TIMES DAILY AS NEEDED FOR OUTBREAKS FOR 7 DAYS   alendronate 70 MG tablet Commonly known as: FOSAMAX Take 1 tablet (70 mg total) by mouth every Sunday. TAKE 1 TABLET BY MOUTH ONCE WEEKLY ON SUNDAY take with a full glass of water on an empty stomach Strength: 70 mg   ALPRAZolam 0.5 MG tablet Commonly known as: XANAX TAKE 2 TO 3 TABLETS BY MOUTH AT BEDTIME AS NEEDED FOR anxiety/sleep   amLODipine 10 MG tablet Commonly known as: NORVASC Take 1 tablet (10 mg total) by mouth daily.   aspirin EC 81 MG tablet Take 1 tablet (81 mg total) by mouth daily with breakfast.   atorvastatin 10 MG tablet Commonly known as: LIPITOR Take 1 tablet (10 mg total) by mouth at bedtime.   benazepril 40 MG tablet Commonly known as: LOTENSIN Take 1 tablet (40 mg total) by mouth daily.   Biktarvy 50-200-25 MG Tabs tablet Generic drug: bictegravir-emtricitabine-tenofovir AF Take 1 tablet by mouth daily.   clopidogrel 75 MG tablet Commonly known as: PLAVIX Take 1 tablet (75 mg total) by mouth daily.   diclofenac Sodium 1 % Gel Commonly known as: VOLTAREN Apply 2 g topically 4 (four) times daily. APPLY TWO GRAMS TO AFFECTED AREA(S) FOUR TIMES DAILY Strength: 1 %   dorzolamide-timolol 2-0.5 % ophthalmic solution Commonly known as: COSOPT Place 1 drop into the right eye 2 (two) times daily.   ferrous sulfate 325 (65 FE) MG tablet Take 1 tablet (325 mg total) by mouth daily with breakfast. Start taking on: January 01, 2023   gabapentin 300 MG  capsule Commonly known as: Neurontin Take 1 capsule (300 mg total) by mouth 3 (three) times daily.   oxyCODONE-acetaminophen 5-325 MG tablet Commonly known as: PERCOCET/ROXICET Take 1 tablet by mouth every 12 (twelve) hours as needed for severe pain.   terazosin 5 MG capsule Commonly known as: HYTRIN Take 5 mg by mouth at bedtime.        Discharge Assessment: Vitals:   12/30/22 2006 12/31/22 0506  BP: 102/72 (!) 153/67  Pulse: 65 61  Resp: 16 20  Temp: 97.9 F (36.6 C) 98.2 F (36.8 C)  SpO2: 99% 99%   Skin clean, dry and intact without evidence of skin break down, no evidence of skin tears noted. IV catheter discontinued intact. Site without signs and symptoms of complications - no redness or edema noted at insertion site, patient denies c/o pain - only slight tenderness at site.  Dressing with slight pressure applied.  D/c Instructions-Education: Discharge instructions given to patient/family with verbalized understanding. D/c education completed with patient/family including follow up instructions, medication list, d/c activities limitations if indicated, with other d/c instructions as indicated by MD - patient able to verbalize understanding, all questions fully answered. Patient instructed to return to ED, call 911, or call MD for any changes in condition.  Patient escorted via WC, and D/C home via private auto.  Laurena Spies, RN 12/31/2022 12:48 PM

## 2022-12-31 NOTE — Plan of Care (Signed)
  Problem: Health Behavior/Discharge Planning: Goal: Ability to manage health-related needs will improve Outcome: Progressing   

## 2022-12-31 NOTE — Procedures (Signed)
Small Bowel [place] Capsule Study Procedure date: 12/30/2022  Referring Provider:  PCP PCP:  Dr. Inez Catalina, MD  Indication for procedure:  severe iron deficiency anemia  Findings:  Capsule stayed in the esophagus for 2:11 hours.  Study was adequate as capsule reached the cecum and the bowel preparation was adequate in the small bowel. No abnormalities were found in the gastrointestinal lining.    First Gastric image:  02:12:22 First Duodenal image: 02:21:10 First Cecal image: 07:04:08 Gastric Passage time: 0h 80m Small Bowel Passage time:  4h 17m  Summary & Recommendations: There was delayed esophageal emptying, but no presence of abnormalities in the stomach or small bowel that could explain severe anemia.  -Start daily ferrous sulfate 325 mg every day -Consider outpatient esophagram and possible manometry if presenting dysphagia -Okay to restart Plavix for now. - Patient will follow up in GI clinic with in 3-4 weeks.  I personally communicated these recommendations to the patient  Katrinka Blazing, MD Gastroenterology and Hepatology [facility] Gastroenterology

## 2023-01-01 ENCOUNTER — Encounter (HOSPITAL_COMMUNITY): Payer: Self-pay | Admitting: Gastroenterology

## 2023-01-01 ENCOUNTER — Telehealth: Payer: Self-pay

## 2023-01-01 NOTE — Transitions of Care (Post Inpatient/ED Visit) (Signed)
01/01/2023  Name: Jessica [name] MRN: [id number] DOB: 1941/01/12  Today's TOC FU Call Status: Today's TOC FU Call Status:: Successful TOC FU Call Completed TOC FU Call Complete Date: 01/01/23 Patient's Name and Date of Birth confirmed.  Transition Care Management Follow-up Telephone Call Date of Discharge: 12/31/22 [facility] Type of Discharge: Inpatient Admission Primary Inpatient Discharge Diagnosis:: GI bleed How have you been since you were released from the hospital?: Better Any questions or concerns?: No  Items Reviewed: Did you receive and understand the discharge instructions provided?: Yes Medications obtained,verified, and reconciled?: Yes (Medications Reviewed) Any new allergies since your discharge?: No Dietary orders reviewed?: Yes Do you have support at home?: No  Medications Reviewed Today: Medications Reviewed Today     Reviewed by Karena Addison, LPN (Licensed Practical Nurse) on 01/01/23 at 1042  Med List Status: <None>   Medication Order Taking? Sig Documenting Provider Last Dose Status Informant  acyclovir (ZOVIRAX) 400 MG tablet [id number] No TAKE 1 TABLET BY MOUTH THREE TIMES DAILY AS NEEDED FOR OUTBREAKS FOR 7 DAYS Inez Catalina, MD 12/27/2022 Active Family Member  alendronate (FOSAMAX) 70 MG tablet [id number] No Take 1 tablet (70 mg total) by mouth every Sunday. TAKE 1 TABLET BY MOUTH ONCE WEEKLY ON SUNDAY take with a full glass of water on an empty stomach Strength: 70 mg Shon Hale, MD 12/24/2022 Active Family Member  ALPRAZolam (XANAX) 0.5 MG tablet [id number] No TAKE 2 TO 3 TABLETS BY MOUTH AT BEDTIME AS NEEDED FOR anxiety/sleep Inez Catalina, MD 12/27/2022 Active Family Member  amLODipine (NORVASC) 10 MG tablet [id number] No Take 1 tablet (10 mg total) by mouth daily. Shon Hale, MD 12/27/2022 Active Family Member  aspirin EC 81 MG tablet [id number] No Take 1 tablet (81 mg total) by mouth daily with  breakfast. Shon Hale, MD 12/27/2022 Active Family Member  atorvastatin (LIPITOR) 10 MG tablet [id number] No Take 1 tablet (10 mg total) by mouth at bedtime. Shon Hale, MD 12/26/2022 Active Family Member  benazepril (LOTENSIN) 40 MG tablet [id number] No Take 1 tablet (40 mg total) by mouth daily. Shon Hale, MD 12/27/2022 Active Family Member  bictegravir-emtricitabine-tenofovir AF (BIKTARVY) 50-200-25 MG TABS tablet [id number] No Take 1 tablet by mouth daily. Daiva Eves, Lisette Grinder, MD 12/27/2022 Active Family Member  clopidogrel (PLAVIX) 75 MG tablet [id number] No Take 1 tablet (75 mg total) by mouth daily. Shon Hale, MD 12/27/2022 0800 Active Family Member  diclofenac Sodium (VOLTAREN) 1 % GEL [id number] No Apply 2 g topically 4 (four) times daily. APPLY TWO GRAMS TO AFFECTED AREA(S) FOUR TIMES DAILY Strength: 1 % Inez Catalina, MD 12/27/2022 Active Family Member  dorzolamide-timolol (COSOPT) 2-0.5 % ophthalmic solution [id number] No Place 1 drop into the right eye 2 (two) times daily. [provider] 12/27/2022 Active Family Member  ferrous sulfate 325 (65 FE) MG tablet [id number]  Take 1 tablet (325 mg total) by mouth daily with breakfast. Tat, Onalee Hua, MD  Active   gabapentin (NEURONTIN) 300 MG capsule [id number] No Take 1 capsule (300 mg total) by mouth 3 (three) times daily. Inez Catalina, MD 12/27/2022 Active Family Member  oxyCODONE-acetaminophen (PERCOCET/ROXICET) 5-325 MG tablet [id number] No Take 1 tablet by mouth every 12 (twelve) hours as needed for severe pain. Inez Catalina, MD 12/27/2022 Active Family Member  terazosin (HYTRIN) 5 MG capsule [id number] No Take 5 mg by mouth at bedtime. [provider] 12/27/2022 Active Family Member  Home Care and Equipment/Supplies: Were Home Health Services Ordered?: NA Any new equipment or medical supplies ordered?: NA  Functional Questionnaire: Do you need assistance with bathing/showering or  dressing?: No Do you need assistance with meal preparation?: No Do you need assistance with eating?: No Do you have difficulty maintaining continence: No Do you need assistance with getting out of bed/getting out of a chair/moving?: No Do you have difficulty managing or taking your medications?: No  Follow up appointments reviewed: PCP Follow-up appointment confirmed?: No (no avail appts, sent message to staff to schedule) MD Provider Line [phone] Given: No Specialist Hospital Follow-up appointment confirmed?: No Reason Specialist Follow-Up Not Confirmed: Patient has Specialist Provider Number and will Call for Appointment Do you need transportation to your follow-up appointment?: No Do you understand care options if your condition(s) worsen?: Yes-patient verbalized understanding    SIGNATURE Karena Addison, LPN [facility] Nurse Health Advisor Direct Dial [phone]

## 2023-01-02 LAB — SURGICAL PATHOLOGY

## 2023-01-02 NOTE — Progress Notes (Signed)
I reviewed the pathology results. Ann, can you send her a letter with the findings as described below please?  Thanks,  Vista Lawman, MD Gastroenterology and Hepatology [facility] Gastroenterology  ---------------------------------------------------------------------------------------------  [facility]astroenter[facility] [address], [address], [place], [place] [address] Phone:  [phone]   01/02/23 Jessica [name], [place]   Dear Suezanne Cheshire,  I am writing to inform you that the biopsies taken during your recent endoscopic examination showed:  No H. Pylori bacteria in stomach , or any early cancer changes to the stomach mucosa ( Intestinal metaplasia)   Although it appears your food-pipe ( esophagus might be slow)  Please continue to take Iron supplementation at home and follow up in GI clinic on 02/07/2023 for Iron deficiency anemia and possible esophageal dysmotility  Please call us at [phone] if you have persistent problems or have questions about your condition that have not been fully answered at this time.  Sincerely,  Vista Lawman, MD Gastroenterology and Hepatology

## 2023-01-03 ENCOUNTER — Encounter (INDEPENDENT_AMBULATORY_CARE_PROVIDER_SITE_OTHER): Payer: Self-pay | Admitting: *Deleted

## 2023-01-04 ENCOUNTER — Encounter (HOSPITAL_COMMUNITY): Payer: Self-pay | Admitting: Gastroenterology

## 2023-01-04 LAB — VITAMIN B1: Vitamin B1 (Thiamine): 54.9 nmol/L — ABNORMAL LOW (ref 66.5–200.0)

## 2023-01-05 ENCOUNTER — Encounter (HOSPITAL_COMMUNITY): Payer: Self-pay | Admitting: Gastroenterology

## 2023-01-08 ENCOUNTER — Other Ambulatory Visit: Payer: Self-pay | Admitting: Internal Medicine

## 2023-01-08 ENCOUNTER — Encounter (HOSPITAL_COMMUNITY): Payer: Medicare Other

## 2023-01-08 ENCOUNTER — Ambulatory Visit: Payer: Medicare Other

## 2023-01-08 DIAGNOSIS — H401133 Primary open-angle glaucoma, bilateral, severe stage: Secondary | ICD-10-CM | POA: Diagnosis not present

## 2023-01-08 DIAGNOSIS — Z961 Presence of intraocular lens: Secondary | ICD-10-CM | POA: Diagnosis not present

## 2023-01-08 NOTE — Telephone Encounter (Signed)
Last rx written - 12/13/22. Last OV - 11/14/22. Next OV - 01/19/23. TOX 01/04/18.

## 2023-01-10 ENCOUNTER — Other Ambulatory Visit: Payer: Self-pay | Admitting: Internal Medicine

## 2023-01-10 DIAGNOSIS — F411 Generalized anxiety disorder: Secondary | ICD-10-CM

## 2023-01-10 NOTE — Telephone Encounter (Signed)
Next appt scheduled 11/8 with PCP.

## 2023-01-15 ENCOUNTER — Other Ambulatory Visit: Payer: Self-pay | Admitting: Internal Medicine

## 2023-01-15 DIAGNOSIS — M270 Developmental disorders of jaws: Secondary | ICD-10-CM | POA: Insufficient documentation

## 2023-01-15 MED ORDER — THIAMINE MONONITRATE 100 MG PO TABS: 100.0000 mg | ORAL_TABLET | Freq: Every day | ORAL | 3 refills | Status: AC

## 2023-01-15 NOTE — Progress Notes (Signed)
Thiamine Rx.

## 2023-01-19 ENCOUNTER — Encounter: Payer: Self-pay | Admitting: Internal Medicine

## 2023-01-19 ENCOUNTER — Ambulatory Visit (INDEPENDENT_AMBULATORY_CARE_PROVIDER_SITE_OTHER): Payer: Medicare Other | Admitting: Internal Medicine

## 2023-01-19 ENCOUNTER — Other Ambulatory Visit: Payer: Self-pay

## 2023-01-19 VITALS — BP 141/64 | HR 57 | Temp 98.1°F | Ht 67.0 in | Wt 120.1 lb

## 2023-01-19 DIAGNOSIS — I739 Peripheral vascular disease, unspecified: Secondary | ICD-10-CM

## 2023-01-19 DIAGNOSIS — E876 Hypokalemia: Secondary | ICD-10-CM

## 2023-01-19 DIAGNOSIS — K2970 Gastritis, unspecified, without bleeding: Secondary | ICD-10-CM

## 2023-01-19 DIAGNOSIS — M5136 Other intervertebral disc degeneration, lumbar region with discogenic back pain only: Secondary | ICD-10-CM

## 2023-01-19 DIAGNOSIS — D649 Anemia, unspecified: Secondary | ICD-10-CM | POA: Diagnosis not present

## 2023-01-19 DIAGNOSIS — H5316 Psychophysical visual disturbances: Secondary | ICD-10-CM

## 2023-01-19 DIAGNOSIS — K2990 Gastroduodenitis, unspecified, without bleeding: Secondary | ICD-10-CM | POA: Diagnosis not present

## 2023-01-19 DIAGNOSIS — E519 Thiamine deficiency, unspecified: Secondary | ICD-10-CM

## 2023-01-19 DIAGNOSIS — F22 Delusional disorders: Secondary | ICD-10-CM

## 2023-01-19 DIAGNOSIS — K921 Melena: Secondary | ICD-10-CM

## 2023-01-19 DIAGNOSIS — I10 Essential (primary) hypertension: Secondary | ICD-10-CM

## 2023-01-19 LAB — COMPREHENSIVE METABOLIC PANEL
ALT: 10 U/L (ref 0–44)
AST: 18 U/L (ref 15–41)
Albumin: 3.9 g/dL (ref 3.5–5.0)
Alkaline Phosphatase: 67 U/L (ref 38–126)
Anion gap: 7 (ref 5–15)
BUN: 14 mg/dL (ref 8–23)
CO2: 20 mmol/L — ABNORMAL LOW (ref 22–32)
Calcium: 9.5 mg/dL (ref 8.9–10.3)
Chloride: 113 mmol/L — ABNORMAL HIGH (ref 98–111)
Creatinine, Ser: 0.69 mg/dL (ref 0.44–1.00)
GFR, Estimated: >60 mL/min (ref >60)
Glucose, Bld: 100 mg/dL — ABNORMAL HIGH (ref 70–99)
Potassium: 3.6 mmol/L (ref 3.5–5.1)
Sodium: 140 mmol/L (ref 135–145)
Total Bilirubin: 0.6 mg/dL (ref <1.2)
Total Protein: 7.4 g/dL (ref 6.5–8.1)

## 2023-01-19 LAB — CBC WITH DIFFERENTIAL/PLATELET
Abs Immature Granulocytes: 0 10*3/uL (ref 0.00–0.07)
Basophils Absolute: 0 10*3/uL (ref 0.0–0.1)
Basophils Relative: 0 %
Eosinophils Absolute: 0 10*3/uL (ref 0.0–0.5)
Eosinophils Relative: 0 %
HCT: 35.1 % — ABNORMAL LOW (ref 36.0–46.0)
Hemoglobin: 11 g/dL — ABNORMAL LOW (ref 12.0–15.0)
Lymphocytes Relative: 38 %
Lymphs Abs: 2.3 10*3/uL (ref 0.7–4.0)
MCH: 28.5 pg (ref 26.0–34.0)
MCHC: 31.3 g/dL (ref 30.0–36.0)
MCV: 90.9 fL (ref 80.0–100.0)
Monocytes Absolute: 0.2 10*3/uL (ref 0.1–1.0)
Monocytes Relative: 3 %
Neutro Abs: 3.6 10*3/uL (ref 1.7–7.7)
Neutrophils Relative %: 59 %
Platelets: 370 10*3/uL (ref 150–400)
RBC: 3.86 MIL/uL — ABNORMAL LOW (ref 3.87–5.11)
RDW: 16.3 % — ABNORMAL HIGH (ref 11.5–15.5)
WBC: 6.1 10*3/uL (ref 4.0–10.5)
nRBC: 0 % (ref 0.0–0.2)
nRBC: 0 /100{WBCs}

## 2023-01-19 MED ORDER — BENADRYL ITCH STOPPING 2 % EX GEL: 1.0000 | CUTANEOUS | 3 refills | Status: AC | PRN

## 2023-01-19 NOTE — Patient Instructions (Addendum)
[name] - -  For your vitamin deficiencies, please keep taking vitamin D and thiamine.  For your pain medication, you have the maximum amount of medication that is safe for you.  Please try to remain active and use your cane and other aids to avoid falls.   For your skin/bugs on the scalp: I would like you to try 2 different lotions for the scalp.  First is medicated benadryl oil/cream up to 4X per day.  Also, try SARNA lotion daily to see if this helps.  If this does not help, the next step is Aripiprazole (attached) at the lowest dose to see if this helps with the sensations.    Thank you!  Come back in 2 months to see how your skin is doing.

## 2023-01-19 NOTE — Progress Notes (Unsigned)
Established Patient Office Visit  Subjective   Patient ID: Jessica [name], female    DOB: Jan 07, 1941  Age: 82 y.o. MRN: [id number]  Chief Complaint  Patient presents with   Hospital F/U Visit    [facility]. Anemic. Had a fall this am. Left hip pain - pain level #7.    Jessica [name] is an 82 year old woman with blindness, HTN, chronic pain, HIV (well controlled), PAD, GAD, HLD and h/o thiamine and vitamin D deficiency who presents for follow.  Jessica [name] was recently in the hospital for GI bleeding and presents post hospitalization.  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  She has been taking iron over the counter.  She was also found to be thiamine deficient and she has been taking thiamine since we discussed this earlier this week.  She was noted to have hallucinations, visual and auditory in the hospital.  She does not drink ETOH, but does have a limited diet.    She notes that for the last few months she has been having a sensation on her scalp which feels like there are bugs under her skin and coming up through the skin.  She feels they are coming from her bones.  She will scratch and area and pull out something which she feels is the bug, but no one else has seen them.  Her daughter helped her with one one time, and felt there may have been something moving, but she felt it was most likely a scab.  She will feel a sensation of things moving under her skin, scratch and then pull something off.  She had a sensation under her left eyebrow in the clinic and began scratching at that spot, she pulled off only dried skin which she felt was a bug.  As noted above, she has very little vision and only sees shadows.   She was curious about her vitamins and is willing to taken them.  She does need a repeat CBC.  She has continued pain, but is at a high dose of pain medication for age, which we discussed.    She further notes a stumble and fall this morning  with a bump to the head.  She reports hitting the front of her head, but having no injury.  There was no bruise on her scalp today.  Daughter is in room with her.      Review of Systems  Constitutional:  Negative for chills, fever, malaise/fatigue and weight loss.  Eyes:  Positive for blurred vision. Negative for discharge and redness.  Respiratory:  Negative for cough and shortness of breath.   Cardiovascular:  Positive for chest pain (notes intermittent pain on the lateral left chest, but cannot give more details). Negative for palpitations, orthopnea, claudication and leg swelling.  Gastrointestinal:  Negative for heartburn, nausea and vomiting.  Genitourinary:  Negative for dysuria.  Musculoskeletal:  Positive for back pain, falls, joint pain and neck pain.  Skin:  Positive for itching. Negative for rash.      Objective:     BP (!) 141/64 (BP Location: Right Arm, Patient Position: Sitting, Cuff Size: Small)   Pulse (!) 57   Temp 98.1 F (36.7 C) (Oral)   Ht 5\' 7"  (1.702 m)   Wt 120 lb 1.6 oz (54.5 kg)   SpO2 100% Comment: RA  BMI 18.81 kg/m  BP Readings from Last 3 Encounters:  01/19/23 (!) 141/64  12/31/22 (!) 153/67  12/04/22 (!) 179/79   Wt Readings from Last 3 Encounters:  01/19/23 120 lb 1.6 oz (54.5 kg)  12/28/22 124 lb (56.2 kg)  12/04/22 124 lb (56.2 kg)      Physical Exam Constitutional:      Appearance: Normal appearance.  Eyes:     General: No scleral icterus.       Right eye: No discharge.        Left eye: No discharge.     Comments: Cloudy left eye  Cardiovascular:     Rate and Rhythm: Normal rate and regular rhythm.     Heart sounds: No murmur heard. Pulmonary:     Effort: Pulmonary effort is normal. No respiratory distress.  Abdominal:     General: Abdomen is flat. There is no distension.     Palpations: Abdomen is soft.  Musculoskeletal:        General: No swelling, tenderness or deformity.  Skin:    General: Skin is warm and dry.      Coloration: Skin is not jaundiced or pale.     Findings: Lesion (picking lesions) present. No bruising, erythema or rash.     Comments: She has areas of picking on the scalp, no apparent lice or other bugs on my inspection.  Areas where she feels something is "under" the skin appear to be normal skin, no bumps or other findings.  She has some areas of picking and dry skin at the forehead and eyebrows.   Neurological:     General: No focal deficit present.     Mental Status: She is alert and oriented to person, place, and time.  Psychiatric:        Mood and Affect: Mood normal. Mood is not anxious. Affect is not blunt.        Behavior: Behavior normal.        Thought Content: Thought content is delusional.        Judgment: Judgment is not impulsive or inappropriate.      Results for orders placed or performed in visit on 01/19/23  CBC with Diff  Result Value Ref Range   WBC 6.1 4.0 - 10.5 K/uL   RBC 3.86 (L) 3.87 - 5.11 MIL/uL   Hemoglobin 11.0 (L) 12.0 - 15.0 g/dL   HCT 91.4 (L) 78.2 - 95.6 %   MCV 90.9 80.0 - 100.0 fL   MCH 28.5 26.0 - 34.0 pg   MCHC 31.3 30.0 - 36.0 g/dL   RDW 21.3 (H) 08.6 - 57.8 %   Platelets 370 150 - 400 K/uL   nRBC 0.0 0.0 - 0.2 %   Neutrophils Relative % 59 %   Neutro Abs 3.6 1.7 - 7.7 K/uL   Lymphocytes Relative 38 %   Lymphs Abs 2.3 0.7 - 4.0 K/uL   Monocytes Relative 3 %   Monocytes Absolute 0.2 0.1 - 1.0 K/uL   Eosinophils Relative 0 %   Eosinophils Absolute 0.0 0.0 - 0.5 K/uL   Basophils Relative 0 %   Basophils Absolute 0.0 0.0 - 0.1 K/uL   nRBC 0 0 /100 WBC   Abs Immature Granulocytes 0.00 0.00 - 0.07 K/uL  CMP w Anion Gap (STAT/Sunquest-performed on-site)  Result Value Ref Range   Sodium 140 135 - 145 mmol/L   Potassium 3.6 3.5 - 5.1 mmol/L   Chloride 113 (H) 98 - 111 mmol/L   CO2 20 (L) 22 - 32 mmol/L   Glucose, Bld 100 (H) 70 - 99 mg/dL   BUN  14 8 - 23 mg/dL   Creatinine, Ser 5.64 0.44 - 1.00 mg/dL   Calcium 9.5 8.9 - 33.2 mg/dL    Total Protein 7.4 6.5 - 8.1 g/dL   Albumin 3.9 3.5 - 5.0 g/dL   AST 18 15 - 41 U/L   ALT 10 0 - 44 U/L   Alkaline Phosphatase 67 38 - 126 U/L   Total Bilirubin 0.6 <1.2 mg/dL   GFR, Estimated >95 >18 mL/min   Anion gap 7 5 - 15      The ASCVD Risk score (Arnett DK, et al., 2019) failed to calculate for the following reasons:   The 2019 ASCVD risk score is only valid for ages 58 to 64    Assessment & Plan:   Problem List Items Addressed This Visit       Unprioritized   Essential hypertension - Primary (Chronic)   Relevant Orders   CMP w Anion Gap (STAT/Sunquest-performed on-site) (Completed)   Gastritis and gastroduodenitis   Relevant Orders   CBC with Diff (Completed)    No follow-ups on file.    Debe Coder, MD

## 2023-01-22 ENCOUNTER — Encounter: Payer: Self-pay | Admitting: Internal Medicine

## 2023-01-22 DIAGNOSIS — M79671 Pain in right foot: Secondary | ICD-10-CM | POA: Diagnosis not present

## 2023-01-22 DIAGNOSIS — I739 Peripheral vascular disease, unspecified: Secondary | ICD-10-CM | POA: Diagnosis not present

## 2023-01-22 DIAGNOSIS — M79675 Pain in left toe(s): Secondary | ICD-10-CM | POA: Diagnosis not present

## 2023-01-22 DIAGNOSIS — M79672 Pain in left foot: Secondary | ICD-10-CM | POA: Diagnosis not present

## 2023-01-22 DIAGNOSIS — L110 Acquired keratosis follicularis: Secondary | ICD-10-CM | POA: Diagnosis not present

## 2023-01-22 DIAGNOSIS — E519 Thiamine deficiency, unspecified: Secondary | ICD-10-CM | POA: Insufficient documentation

## 2023-01-22 DIAGNOSIS — M79674 Pain in right toe(s): Secondary | ICD-10-CM | POA: Diagnosis not present

## 2023-01-22 NOTE — Assessment & Plan Note (Signed)
This was the main issue we discussed today.  She notes a sensation of crawling on her scalp, something under the skin and connected to her bone.  She showed me a few spots on her scalp which appear to be related to excoriation.  She showed me a spot on her left eyebrow which was skin only.  We discussed neuropathy and neuralgia that with her blindness may be mimicing something crawling under the skin.  She is using a steroid lotion and a medicated shampoo which is helping somewhat.  She notes taking her gabapentin.  We reviewed medications like aripiprazole for this diagnosis, and I think this might be needed in the future.   Plan Sarna lotion and topical benadryl Consider 2mg  of aripiprazole in the future.  Supplement thiamine.

## 2023-01-22 NOTE — Assessment & Plan Note (Signed)
Will check K today with a CMP.

## 2023-01-22 NOTE — Assessment & Plan Note (Signed)
Based on GI bleeding.  Will check CBC today.  Iron at next visit.  Advised her to continue iron supplementation.

## 2023-01-22 NOTE — Assessment & Plan Note (Signed)
She is here for Hospital Follow up for this issue.  Reviewed imaging and it appears the most likely diagnosis was a diverticular bleed.  She has been taking her iron without issue.  She did have hallucinations while in the hospital and further work up was completed for this including a thiamine, which was low.    Today, she has no further bleeding, no dizziness, lightheadedness or other issues.  Daughter is with her and confirms these findings.   Plan Check CBC today for H/H.  Continue iron supplementation.  Check iron stores at next visit.

## 2023-01-22 NOTE — Assessment & Plan Note (Signed)
Blood pressure today is initially elevated and then improved on recheck.  She has a low diastolic, issues with dizziness and falls, so we have opted to not increase her blood pressure medication at this time.  She will continue on amlodipine, benazepril and terazosin.  She did fall this morning, but tripped due to blindness and not dizziness.

## 2023-01-22 NOTE — Progress Notes (Signed)
Letter sent via mychart.   Labs improved, H/H improved.  Continue iron.  Check iron at next visit.

## 2023-01-22 NOTE — Assessment & Plan Note (Signed)
These continue. She was assessed for metabolic encephalopathy in the hospital, but this diagnosis continues to be the most likely for this patient.  She does have thiamine deficiency, which we will treat with thiamine 100mg  daily and then recheck.

## 2023-01-22 NOTE — Assessment & Plan Note (Signed)
>>  ASSESSMENT AND PLAN FOR SYMPTOMATIC ANEMIA WRITTEN ON 01/22/2023  2:25 PM BY MULLEN, EMILY B, MD  Based on GI bleeding.  Will check CBC today.  Iron at next visit.  Advised her to continue iron supplementation.

## 2023-01-22 NOTE — Assessment & Plan Note (Signed)
This issue can also cause a neuropathy.  Her level was low in the hospital.  Will treat and possibly her feelings of crawling on the scalp will improve.   Thiamine 100mg  daily Recheck at next visit.

## 2023-01-22 NOTE — Assessment & Plan Note (Signed)
Doing well, back on aspirin and plavix without further bleeding.  Follows with vascular surgery.

## 2023-01-22 NOTE — Assessment & Plan Note (Addendum)
Discussed today.  Family asking for increased pain medication.  We discussed with her blindness, hallucinations, chronic falls that further medication that can cause dizziness and other side effects should not be increased.  Will plan to continue current therapy.  She has percocet and gabapentin.  She needs an updated pain contract which we will do next visit.

## 2023-02-02 ENCOUNTER — Other Ambulatory Visit: Payer: Self-pay | Admitting: Internal Medicine

## 2023-02-02 NOTE — Telephone Encounter (Signed)
Last rx written - 01/08/23. Last OV - 01/19/23. Next OV - 04/05/23 with PCP. TOX - 01/04/18.

## 2023-02-05 ENCOUNTER — Ambulatory Visit (INDEPENDENT_AMBULATORY_CARE_PROVIDER_SITE_OTHER)
Admission: RE | Admit: 2023-02-05 | Discharge: 2023-02-05 | Disposition: A | Discharge status: Home / Self Care | Payer: Medicare Other | Source: Ambulatory Visit | Attending: Surgery | Admitting: Surgery

## 2023-02-05 ENCOUNTER — Ambulatory Visit (INDEPENDENT_AMBULATORY_CARE_PROVIDER_SITE_OTHER): Payer: Medicare Other | Admitting: Physician Assistant

## 2023-02-05 ENCOUNTER — Ambulatory Visit (HOSPITAL_COMMUNITY)
Admission: RE | Admit: 2023-02-05 | Discharge: 2023-02-05 | Disposition: A | Discharge status: Home / Self Care | Payer: Medicare Other | Source: Ambulatory Visit | Attending: Surgery | Admitting: Surgery

## 2023-02-05 VITALS — BP 153/72 | HR 64 | Temp 98.8°F | Ht 67.0 in | Wt 121.6 lb

## 2023-02-05 DIAGNOSIS — Z95828 Presence of other vascular implants and grafts: Secondary | ICD-10-CM

## 2023-02-05 DIAGNOSIS — F17200 Nicotine dependence, unspecified, uncomplicated: Secondary | ICD-10-CM

## 2023-02-05 DIAGNOSIS — I739 Peripheral vascular disease, unspecified: Secondary | ICD-10-CM

## 2023-02-05 LAB — VAS US ABI WITH/WO TBI
Left ABI: 0.54
Right ABI: 0.98

## 2023-02-05 NOTE — Progress Notes (Signed)
HISTORY AND PHYSICAL     CC:  follow up. Requesting Provider:  Inez Catalina, MD  HPI: This is a 82 y.o. female who is here today for follow up for PAD.  Pt has hx of aortobifemoral bypass graft by Dr. Hart Rochester in 2009. This was done for severe claudication in both legs. She simultaneously underwent a left femoral endarterectomy.  On 07/11/2020, she underwent redo left femoral artery exposure and thrombectomy of left limb of ABF, profunda and SFA by Dr. Myra Gianotti for ischemia.   Pt was last seen 12/13/2021 and at that time, she was not having any rest pain, claudication or non healing wounds.  She was having some burning in the RLE but was interfering with her daily activities.  Her ABI's were stable and she was scheduled for one year follow up.   She has PMH significant for HIV, hypercholesterolemia, HTN.  The pt returns today for follow up and here with her daughter [name].  She states she has some burning in the right lateral lower leg.  It doesn't radiate from her back.  It has been present for about 3 years.  She denies any claudication, rest pain or non healing wounds.  She is compliant with her asa/statin/plavix.  She continues to smoke.    The pt is on a statin for cholesterol management.    The pt is on an aspirin.    Other AC:  plavix The pt is on CCB for hypertension.  The pt is not on medication for diabetes. Tobacco hx:  current  Pt does not have family hx of AAA.  Past Medical History:  Diagnosis Date   Acute blood loss anemia 08/08/2020   Acute renal failure (ARF) (HCC) 08/17/2017   Anxiety 04/05/2012   Bilateral sensorineural hearing loss 06/11/2014   Mild to moderate on the left side and slight to mild on the right side per audiometry 05/2014.  Hearing aides with possible masking of tinnitus recommended but patient wished to defer secondary to finances.   Blood transfusion without reported diagnosis    pt denies   Bursitis of right shoulder 07/12/2012   s/p shoulder  injection 07/12/2012    Cataract of right eye    REMOVED RIGHT EYE 4-19    Constipation due to pain medication 04/27/2010   Diverticulosis 02/08/2012   Extensive left-sided diverticula on colonoscopy March 2012 per Dr. Jena Gauss    Essential hypertension 07/20/2006   Genital herpes 07/20/2006   Glaucoma of left eye 07/20/2006   Headache 10/20/2019   Heart murmur 1961   Human immunodeficiency virus disease (HCC) 03/27/1986   Hyperlipidemia LDL goal < 100 04/05/2012   Hypokalemia 04/12/2018   Insomnia 10/20/2019   Left hip pain 04/11/2021   Leg pain 04/11/2021   Long-term current use of opiate analgesic 03/17/2016   Loose stools 04/08/2019   Lumbar degenerative disc disease 07/20/2006   With chronic back pain    Marijuana use 07/03/2016   Micturition syncope 09/20/2015   Nausea and vomiting 04/08/2019   Opiate dependence (HCC) 04/12/2018   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   s/p aortobifem bypass 2009    Periumbilical hernia 05/18/2014   1 cm left periumbilical abdominal wall defect   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   DEXA 04/15/2012: L1-L4 spine T -3.9, Right femur T -3.0    Right rotator cuff tear 02/01/2013   Responds to periodic steroid injections   Seborrhea 09/01/2010   Shoulder pain, right 12/04/2017   Small bowel obstruction due to  adhesions (HCC) 02/08/2012   s/p Exploratory laparotomy, lysis of adhesions 02/12/12     Subjective tinnitus of both ears 05/18/2014   Tobacco abuse 02/19/2012   Tobacco abuse    Vasovagal syncope 02/15/2015   Visual hallucination 12/04/2022   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 Jamey Ripa; "took out 12 inches of small intestiines and removed blockage"   COLONOSCOPY  2012   COLONOSCOPY WITH PROPOFOL N/A 12/29/2022   Procedure: COLONOSCOPY WITH PROPOFOL;  Surgeon: Dolores Frame, MD;  [facility]: [facility];  Service: Gastroenterology;  Laterality: N/A;   ESOPHAGOGASTRODUODENOSCOPY (EGD) WITH PROPOFOL N/A 12/28/2022   Procedure: ESOPHAGOGASTRODUODENOSCOPY (EGD) WITH PROPOFOL;  Surgeon: Franky Macho, MD;  [facility]: [facility];  Service: Endoscopy;  Laterality: N/A;   EYE SURGERY     EYE SURGERY  06/29/2020   06-29-2020- RIGHT CATARACT REMOVED AND LEFT EYE SURGERY TO REMOVE SAND LIKE SUBSTANCE    FEMORAL ARTERY EXPLORATION Left 07/10/2020   Procedure: REDO LEFT FEMORAL ARTERY EXPOSURE;  Surgeon: Nada Libman, MD;  Location: [facility];  Service: Vascular;  Laterality: Left;   GIVENS CAPSULE STUDY N/A 12/30/2022   Procedure: GIVENS CAPSULE STUDY;  Surgeon: Dolores Frame, MD;  [facility]: [facility];  Service: Gastroenterology;  Laterality: N/A;   LAPAROTOMY  02/12/2012   Procedure: EXPLORATORY LAPAROTOMY;  Surgeon: Almond Lint, MD;  Location: [facility];  Service: General;  Laterality: N/A;  Exploratory Laparotomy, lysis of adhesions   SMALL INTESTINE SURGERY     THROMBECTOMY FEMORAL ARTERY Left 07/10/2020   Procedure: THROMBECTOMY AORTA-BIFEMORAL GRAFT, PROFUNDA, AND SUPERFICIAL FEMORAL ARTERY  LEFT LEG;  Surgeon: Nada Libman, MD;  Location: [facility];  Service: Vascular;  Laterality: Left;    Allergies  Allergen Reactions   Hctz [Hydrochlorothiazide] Other (See Comments)    Dizziness, syncope; does NOT wish to take anymore    Current Outpatient Medications  Medication Sig Dispense Refill   acyclovir  (ZOVIRAX) 400 MG tablet TAKE 1 TABLET BY MOUTH THREE TIMES DAILY AS NEEDED FOR OUTBREAKS FOR 7 DAYS 21 tablet 5   alendronate (FOSAMAX) 70 MG tablet Take 1 tablet (70 mg total) by mouth every Sunday. TAKE 1 TABLET BY MOUTH ONCE WEEKLY ON SUNDAY take with a full glass of water on an empty stomach Strength: 70 mg 12 tablet 3   ALPRAZolam (XANAX) 0.5 MG tablet TAKE 2 TO 3 TABLETS BY MOUTH AT BEDTIME AS NEEDED FOR anxiety/sleep 90 tablet 1   amLODipine (NORVASC) 10 MG tablet Take 1 tablet (10 mg total) by mouth daily. 90 tablet 3   aspirin EC 81 MG tablet Take 1 tablet (81 mg total) by mouth daily with breakfast. 30 tablet 5   atorvastatin (LIPITOR) 10 MG tablet Take 1 tablet (10 mg total) by mouth at bedtime. 90 tablet 3   benazepril (LOTENSIN) 40  MG tablet Take 1 tablet (40 mg total) by mouth daily. 90 tablet 3   bictegravir-emtricitabine-tenofovir AF (BIKTARVY) 50-200-25 MG TABS tablet Take 1 tablet by mouth daily. 30 tablet 11   clopidogrel (PLAVIX) 75 MG tablet Take 1 tablet (75 mg total) by mouth daily. 90 tablet 3   diclofenac Sodium (VOLTAREN) 1 % GEL Apply 2 g topically 4 (four) times daily. APPLY TWO GRAMS TO AFFECTED AREA(S) FOUR TIMES DAILY Strength: 1 % 350 g 2   DIPHENHYDRAMINE HCL, TOPICAL, (BENADRYL ITCH STOPPING) 2 % GEL Apply 1 Application topically every 4 (four) hours as needed (symptoms of bugs on scalp). 57 g 3   dorzolamide-timolol (COSOPT) 2-0.5 % ophthalmic solution Place 1 drop into the right eye 2 (two) times daily.     ferrous sulfate 325 (65 FE) MG tablet Take 1 tablet (325 mg total) by mouth daily with breakfast.     gabapentin (NEURONTIN) 300 MG capsule Take 1 capsule (300 mg total) by mouth 3 (three) times daily. 270 capsule 3   oxyCODONE-acetaminophen (PERCOCET/ROXICET) 5-325 MG tablet Take 1 tablet by mouth every 8 (eight) hours as needed for severe pain (pain score 7-10). 60 tablet 0   terazosin (HYTRIN) 5 MG capsule Take 5 mg by mouth at bedtime.     thiamine (VITAMIN  B1) 100 MG tablet Take 1 tablet (100 mg total) by mouth daily. 90 tablet 3   No current facility-administered medications for this visit.    Family History  Problem Relation Age of Onset   Kidney failure Mother    Diabetes Mother    Hypertension Mother    Heart disease Mother    Glaucoma Father    Congestive Heart Failure Sister    Diabetes Sister    Kidney disease Sister    Diabetes Brother    Unexplained death Brother 63       Automobile accident   Hypothyroidism Daughter    Arthritis Daughter        Neck/Back   Healthy Son    HIV/AIDS Brother    HIV Daughter    Kidney disease Daughter    Arthritis Son        Knee   Colon cancer Neg Hx    Colon polyps Neg Hx    Esophageal cancer Neg Hx    Rectal cancer Neg Hx    Stomach cancer Neg Hx     Social History   Socioeconomic History   Marital status: Widowed    Spouse name: Not on file   Number of children: 4   Years of education: 2y college   Highest education level: Not on file  Occupational History   Occupation: retired    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   Tobacco Use   Smoking status: Every Day    Current packs/day: 0.30    Average packs/day: 0.3 packs/day for 50.0 years (15.0 ttl pk-yrs)    Types: Cigarettes    Passive exposure: Current   Smokeless tobacco: Never   Tobacco comments:    5 cigs per day  Vaping Use   Vaping status: Never Used  Substance and Sexual Activity   Alcohol use: No    Alcohol/week: 0.0 standard drinks of alcohol    Comment: "last drink of alcohol ~ 1977"   Drug use: No   Sexual activity: Never  Other Topics Concern   Not on file  Social History Narrative   Lives alone in [place], [place]   Social Determinants of  Health   Financial Resource Strain: Low Risk  (08/16/2022)   Overall Financial Resource Strain (CARDIA)    Difficulty of Paying Living Expenses: Not hard at all  Food Insecurity: No Food Insecurity (12/28/2022)   Hunger Vital Sign    Worried About  Running Out of Food in the Last Year: Never true    Ran Out of Food in the Last Year: Never true  Transportation Needs: No Transportation Needs (12/28/2022)   PRAPARE - Administrator, Civil Service (Medical): No    Lack of Transportation (Non-Medical): No  Physical Activity: Insufficiently Active (08/16/2022)   Exercise Vital Sign    Days of Exercise per Week: 1 day    Minutes of Exercise per Session: 10 min  Stress: Stress Concern Present (08/16/2022)   [employer] of Occupational Health - Occupational Stress Questionnaire    Feeling of Stress : To some extent  Social Connections: Moderately Isolated (08/16/2022)   Social Connection and Isolation Panel [NHANES]    Frequency of Communication with Friends and Family: More than three times a week    Frequency of Social Gatherings with Friends and Family: More than three times a week    Attends Religious Services: More than 4 times per year    Active Member of [employer] or Organizations: No    Attends Banker Meetings: Never    Marital Status: Widowed  Intimate Partner Violence: Not At Risk (12/28/2022)   Humiliation, Afraid, Rape, and Kick questionnaire    Fear of Current or Ex-Partner: No    Emotionally Abused: No    Physically Abused: No    Sexually Abused: No     REVIEW OF SYSTEMS:   [X]  denotes positive finding, [ ]  denotes negative finding Cardiac  Comments:  Chest pain or chest pressure:    Shortness of breath upon exertion:    Short of breath when lying flat:    Irregular heart rhythm:        Vascular    Pain in calf, thigh, or hip brought on by ambulation:    Pain in feet at night that wakes you up from your sleep:     Blood clot in your veins:    Leg swelling:         Pulmonary    Oxygen at home:    Productive cough:     Wheezing:         Neurologic    Sudden weakness in arms or legs:     Sudden numbness in arms or legs:     Sudden onset of difficulty speaking or slurred speech:     Temporary loss of vision in one eye:     Problems with dizziness:         Gastrointestinal    Blood in stool:     Vomited blood:         Genitourinary    Burning when urinating:     Blood in urine:        Psychiatric    Major depression:         Hematologic    Bleeding problems:    Problems with blood clotting too easily:        Skin    Rashes or ulcers:        Constitutional    Fever or chills:      PHYSICAL EXAMINATION:  Today's Vitals   02/05/23 1137  BP: (!) 153/72  Pulse: 64  Temp: 98.8 F (37.1 C)  TempSrc: Temporal  SpO2: 94%  Weight: 121 lb 9.6 oz (55.2 kg)  Height: 5\' 7"  (1.702 m)  PainSc: 3    Body mass index is 19.05 kg/m.   General:  WDWN in NAD; vital signs documented above Gait: Not observed HENT: WNL, normocephalic Pulmonary: normal non-labored breathing , without wheezing Cardiac: regular HR, without carotid bruits Abdomen: soft, NT; aortic pulse is  palpable Skin: without rashes Vascular Exam/Pulses:  Right Left  Radial 2+ (normal) 2+ (normal)  Femoral 2+ (normal) 2+ (normal)  Popliteal Unable to palpate Unable to palpate  DP 2+ (normal) monophasic  PT multiphasic monophasic  Peroneal multiphasic monophasic   Extremities: without ischemic changes, without Gangrene , without cellulitis; without open wounds Musculoskeletal: no muscle wasting or atrophy  Neurologic: A&O X 3 Psychiatric:  The pt has Normal affect.   Non-Invasive Vascular Imaging:   ABI's/TBI's on 02/05/2023: Right:  0.98/0.66 - Great toe pressure: 83 Left:  0.54/0.24 - Great toe pressure: 30  Arterial duplex on 02/05/2023: +-----------+--------+-----+---------------+---------+--------------------+  RIGHT     PSV cm/sRatioStenosis       Waveform Comments              +-----------+--------+-----+---------------+---------+--------------------+  CFA Distal 135                         triphasic                      +-----------+--------+-----+---------------+---------+--------------------+  DFA       244          50-74% stenosisbiphasic no plaque  visualized  +-----------+--------+-----+---------------+---------+--------------------+  SFA Prox   130                         biphasic                      +-----------+--------+-----+---------------+---------+--------------------+  SFA Mid    170          30-49% stenosisbiphasic                      -----------+--------+-----+---------------+---------+--------------------+  SFA Distal 300          50-74% stenosisbiphasic                       +-----------+--------+-----+---------------+---------+--------------------+  POP Prox   111                         biphasic                       +-----------+--------+-----+---------------+---------+--------------------+  POP Mid    106                         biphasic                      +-----------+--------+-----+---------------+---------+--------------------+  POP Distal 72                          biphasic                      +-----------+--------+-----+---------------+---------+--------------------+  ATA Distal 61  biphasic                      +-----------+--------+-----+---------------+---------+--------------------+  PTA Distal 50                          biphasic                      +-----------+--------+-----+---------------+---------+--------------------+  PERO Distal                                     NV                   +-----------+--------+-----+---------------+---------+--------------------+   +----------+--------+-----+--------+----------+---------+  LEFT     PSV cm/sRatioStenosisWaveform  Comments   +----------+--------+-----+--------+----------+---------+  CFA Distal68                   triphasic            +----------+--------+-----+--------+----------+---------+  DFA      105                   triphasic            +----------+--------+-----+--------+----------+---------+  SFA Prox               occluded                     +----------+--------+-----+--------+----------+---------+  SFA Mid                occluded          colateral  +----------+--------+-----+--------+----------+---------+  SFA Distal             occluded          colateral  +----------+--------+-----+--------+----------+---------+  POP Prox  29                   monophasic           +----------+--------+-----+--------+----------+---------+  POP Mid   10                   monophasic           +----------+--------+-----+--------+----------+---------+  ATA Distal20                   monophasic           +----------+--------+-----+--------+----------+---------+  PTA Distal26                   monophasic           +----------+--------+-----+--------+----------+---------+     Summary:  Right: 50-74% stenosis noted in the deep femoral artery (no plaque  visualized. 30-49% stenosis noted in the mid superficial femoral artery.  50-74% stenosis noted in the distal superficial femoral artery .   Left: Total occlusion noted from the proximal superficial femoral artery  to the distal segment with visualized colaterals.   Previous ABI's/TBI's on 12/13/2021: Right:  1.02/0.77 - Great toe pressure: 100 Left:  0.51/0.47 - Great toe pressure:  61  Previous arterial duplex on 12/13/2021: +----------+--------+-----+---------------+--------+--------+  RIGHT    PSV cm/sRatioStenosis       WaveformComments  +----------+--------+-----+---------------+--------+--------+  CFA Distal80                          biphasic          +----------+--------+-----+---------------+--------+--------+  DFA      173          30-49% stenosisbiphasic          +----------+--------+-----+---------------+--------+--------+  SFA Prox  169          30-49%  stenosisbiphasic          +----------+--------+-----+---------------+--------+--------+  SFA Mid   188          30-49% stenosisbiphasic          +----------+--------+-----+---------------+--------+--------+  SFA Distal228          50-74% stenosisbiphasic          +----------+--------+-----+---------------+--------+--------+  POP Prox  98                          biphasic          +----------+--------+-----+---------------+--------+--------+  ATA Distal69                          biphasic          +----------+--------+-----+---------------+--------+--------+  PTA Distal47                          biphasic          +----------+--------+-----+---------------+--------+--------+   +----------+--------+-----+---------------+----------+---------------------  LEFT     PSV cm/sRatioStenosis       Waveform  Comments              +----------+--------+-----+---------------+----------+---------------------  CFA Distal98                                                          +----------+--------+-----+---------------+----------+---------------------  DFA      146                         biphasic                        +----------+--------+-----+---------------+----------+---------------------  SFA Prox  395          50-74% stenosisbiphasic                        +----------+--------+-----+---------------+----------+---------------------  SFA Mid                                         minimal flow detected  +----------+--------+-----+---------------+----------+---------------------  SFA Distal130                         monophasic                      +----------+--------+-----+---------------+----------+---------------------  POP Prox  41                          monophasic                      +----------+--------+-----+---------------+----------+---------------------  ATA Distal22                           monophasic                      +----------+--------+-----+---------------+----------+---------------------  PTA Distal25                          monophasic                      +----------+--------+-----+---------------+----------+---------------------   Summary:  Right: 50-74% stenosis noted in the distal superficial femoral artery.  Left: 50-74% stenosis (top end of range) in the proximal superficial femoral artery.  Minimal to no flow detected in the mid SFA (difficult to determine due to bulky plaque), however, there is reconstitution of flow in the distal SFA.      ASSESSMENT/PLAN:: 82 y.o. female here for follow up for PAD with hx of aortobifemoral bypass graft by Dr. Hart Rochester in 2009. This was done for severe claudication in both legs. She simultaneously underwent a left femoral endarterectomy.  On 07/11/2020, she underwent redo left femoral artery exposure and thrombectomy of left limb of ABF, profunda and SFA by Dr. Myra Gianotti for ischemia.   PAD -pt doing well today without claudication, rest pain or non healing wounds. -continue asa/statin/plavix -on duplex today, left SFA is occluded. She does have brisk monophasic doppler flow in the left DP/PT/Peroneal.  She is motor and sensory in tact.  ABI's are essentially unchanged. -discussed with pt to continue increased walking program and to protect her feet, especially the left one.   -the right DP pulse is palpable.  She does have some mild stenosis in the right SFA.    Current smoker -discussed the importance of smoking cessation and that it puts her at risk for limb loss, heart attack, stroke and cancers.  She is going to work on quitting.   -pt will f/u in one year with ABI, aortoiliac duplex.  Will also get carotid duplex since she has not had one recently given her hx of PAD and smoking history.     Doreatha Massed, [facility] [phone]  Clinic MD:   Hetty Blend on call MD

## 2023-02-06 ENCOUNTER — Other Ambulatory Visit: Payer: Self-pay

## 2023-02-06 DIAGNOSIS — I739 Peripheral vascular disease, unspecified: Secondary | ICD-10-CM

## 2023-02-06 DIAGNOSIS — Z95828 Presence of other vascular implants and grafts: Secondary | ICD-10-CM

## 2023-02-06 DIAGNOSIS — F17200 Nicotine dependence, unspecified, uncomplicated: Secondary | ICD-10-CM

## 2023-02-06 MED ORDER — OXYCODONE-ACETAMINOPHEN 5-325 MG PO TABS: 1.0000 | ORAL_TABLET | Freq: Three times a day (TID) | ORAL | 0 refills | Status: DC | PRN

## 2023-02-07 ENCOUNTER — Ambulatory Visit (INDEPENDENT_AMBULATORY_CARE_PROVIDER_SITE_OTHER): Payer: Medicare Other | Admitting: Gastroenterology

## 2023-02-07 ENCOUNTER — Encounter (INDEPENDENT_AMBULATORY_CARE_PROVIDER_SITE_OTHER): Payer: Self-pay | Admitting: Gastroenterology

## 2023-02-07 VITALS — BP 132/73 | HR 80 | Temp 98.2°F | Ht 67.0 in | Wt 122.6 lb

## 2023-02-07 DIAGNOSIS — D509 Iron deficiency anemia, unspecified: Secondary | ICD-10-CM

## 2023-02-07 DIAGNOSIS — T454X5A Adverse effect of iron and its compounds, initial encounter: Secondary | ICD-10-CM | POA: Diagnosis not present

## 2023-02-07 DIAGNOSIS — D500 Iron deficiency anemia secondary to blood loss (chronic): Secondary | ICD-10-CM | POA: Insufficient documentation

## 2023-02-07 DIAGNOSIS — K5903 Drug induced constipation: Secondary | ICD-10-CM | POA: Diagnosis not present

## 2023-02-07 DIAGNOSIS — K5904 Chronic idiopathic constipation: Secondary | ICD-10-CM | POA: Insufficient documentation

## 2023-02-07 DIAGNOSIS — I743 Embolism and thrombosis of arteries of the lower extremities: Secondary | ICD-10-CM

## 2023-02-07 NOTE — Patient Instructions (Signed)
It was very nice to meet you today, as dicussed with will plan for the following :  1) Hematology referral  2) Ensure adequate fluid intake: Aim for 8 glasses of water daily. Follow a high fiber diet: Include foods such as dates, prunes, pears, and kiwi. Use Metamucil twice a day.

## 2023-02-07 NOTE — Progress Notes (Signed)
Vista Lawman , M.D. Gastroenterology & Hepatology [facility] Gastroenterology [address] [place], [place] [address] Primary Care Physician: Miguel Aschoff, MD [address] [place] [address]  Chief Complaint:  Iron deficiency anemia and possible esophageal dysmotility   History of Present Illness: Jessica [name] is a 82 y.o. 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  Lab at that time demonstrated hemoglobin 7.6 ferritin.  Patient underwent inpatient upper endoscopy colonoscopy and capsule endoscopy which were negative to explain iron deficiency anemia. Esophagogastroduodenoscopy showed nodularity in the stomach and small mount of food in the esophagus and body.  Colonoscopy showed diverticulosis and scant amount of dark brown stool but no presence of active bleeding or any source of recent bleeding.    Capsule endoscopy was also negative .  Patient underwent 2 unit PRBC transfusion with adequate response.  Gastric biopsies are negative for H. pylori  Today patient is accompanying with daughter in the clinic.  Patient reports she has been taking iron tablets every day which is accompanying with some constipation and hard stools.  The patient denies having any nausea, vomiting, fever, chills, hematochezia, melena, hematemesis, abdominal distention, abdominal pain, diarrhea, jaundice, pruritus or weight loss.  Patient again today denies any dysphagia and reports that right before upper endoscopy in the hospital she quietly had few bites of food which he did not report before procedure  Last  EGD:12/2022 - Food in the middle third of the esophagus gently pushed down in the stomach  - A small amount of food ( residue) in the stomach. . No evidence of old or active bleeding  - Gastritis. Biopsied.  -Normal duodenal bulb and second portion of the duodenum  Last Colonoscopy:12/2022  - The examined portion of the ileum was normal.  - Diverticulosis in the sigmoid colon and in the descending colon.  - Tortuous colon.  Most of the stool in the lumen was dark brown, with very scant amount with blood tinge. - The distal rectum and anal verge are normal on retroflexion view.  Most recent labs from 01/19/2023 with hemoglobin 11 liver enzymes normal Past Medical History: Past Medical History:  Diagnosis Date   Acute blood loss anemia 08/08/2020   Acute renal failure (ARF) (HCC) 08/17/2017   Anxiety 04/05/2012   Bilateral sensorineural hearing loss 06/11/2014   Mild to moderate on the left side and slight to mild on the right side per audiometry 05/2014.  Hearing aides with possible masking of tinnitus recommended but patient wished to defer secondary to finances.   Blood transfusion without reported diagnosis    pt denies   Bursitis of right shoulder 07/12/2012   s/p shoulder injection 07/12/2012    Cataract of right eye    REMOVED RIGHT EYE 4-19    Constipation due to pain medication 04/27/2010   Diverticulosis 02/08/2012   Extensive left-sided diverticula on colonoscopy March 2012 per Dr. Jena Gauss    Essential hypertension 07/20/2006   Genital herpes 07/20/2006   Glaucoma of left eye 07/20/2006   Headache 10/20/2019   Heart murmur 1961   Human immunodeficiency virus disease (HCC) 03/27/1986   Hyperlipidemia LDL goal < 100 04/05/2012  Hypokalemia 04/12/2018   Insomnia 10/20/2019   Left hip pain 04/11/2021   Leg pain 04/11/2021   Long-term current use of opiate analgesic 03/17/2016   Loose stools 04/08/2019   Lumbar degenerative disc disease 07/20/2006   With chronic back pain     Marijuana use 07/03/2016   Micturition syncope 09/20/2015   Nausea and vomiting 04/08/2019   Opiate dependence (HCC) 04/12/2018   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   s/p aortobifem bypass 2009    Periumbilical hernia 05/18/2014   1 cm left periumbilical abdominal wall defect   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   DEXA 04/15/2012: L1-L4 spine T -3.9, Right femur T -3.0    Right rotator cuff tear 02/01/2013   Responds to periodic steroid injections   Seborrhea 09/01/2010   Shoulder pain, right 12/04/2017   Small bowel obstruction due to adhesions (HCC) 02/08/2012   s/p Exploratory laparotomy, lysis of adhesions 02/12/12     Subjective tinnitus of both ears 05/18/2014   Tobacco abuse 02/19/2012   Tobacco abuse    Vasovagal syncope 02/15/2015   Visual hallucination 12/04/2022   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 Jamey Ripa; "took out 12 inches of small intestiines and removed blockage"   COLONOSCOPY  2012   COLONOSCOPY WITH PROPOFOL N/A 12/29/2022   Procedure: COLONOSCOPY WITH PROPOFOL;  Surgeon: Dolores Frame, MD;  Location: [facility];  Service: Gastroenterology;  Laterality: N/A;   ESOPHAGOGASTRODUODENOSCOPY (EGD) WITH PROPOFOL N/A 12/28/2022   Procedure: ESOPHAGOGASTRODUODENOSCOPY (EGD) WITH PROPOFOL;  Surgeon: Franky Macho, MD;  Location: [facility];  Service: Endoscopy;  Laterality: N/A;   EYE SURGERY     EYE SURGERY  06/29/2020   06-29-2020- RIGHT CATARACT REMOVED AND LEFT EYE SURGERY TO REMOVE SAND LIKE SUBSTANCE    FEMORAL ARTERY EXPLORATION Left 07/10/2020   Procedure: REDO LEFT FEMORAL ARTERY EXPOSURE;  Surgeon: Nada Libman, MD;  Location: [facility] OR;  Service: Vascular;  Laterality: Left;   GIVENS CAPSULE STUDY N/A 12/30/2022   Procedure: GIVENS CAPSULE STUDY;  Surgeon: Dolores Frame, MD;  [facility]: [facility];  Service: Gastroenterology;  Laterality: N/A;   LAPAROTOMY  02/12/2012   Procedure: EXPLORATORY LAPAROTOMY;  Surgeon: Almond Lint, MD;  Location: [facility] OR;  Service: General;  Laterality: N/A;  Exploratory Laparotomy, lysis of adhesions   SMALL INTESTINE SURGERY     THROMBECTOMY FEMORAL ARTERY Left 07/10/2020   Procedure: THROMBECTOMY AORTA-BIFEMORAL GRAFT, PROFUNDA, AND SUPERFICIAL FEMORAL ARTERY  LEFT LEG;  Surgeon: Nada Libman, MD;  Location: [facility] OR;  Service: Vascular;  Laterality: Left;    Family History: Family History  Problem Relation Age of Onset   Kidney failure Mother    Diabetes Mother    Hypertension Mother    Heart disease Mother    Glaucoma Father    Congestive Heart  Failure Sister    Diabetes Sister    Kidney disease Sister    Diabetes Brother    Unexplained death Brother 33       Automobile accident   Hypothyroidism Daughter    Arthritis Daughter        Neck/Back   Healthy Son    HIV/AIDS Brother    HIV Daughter    Kidney disease Daughter    Arthritis Son        Knee   Colon cancer Neg Hx    Colon polyps Neg Hx    Esophageal cancer Neg Hx    Rectal cancer Neg Hx    Stomach cancer Neg Hx     Social History: Social History   Tobacco Use  Smoking Status Every Day   Current packs/day: 0.30   Average packs/day: 0.3 packs/day for 50.0 years (15.0 ttl pk-yrs)   Types: Cigarettes   Passive exposure: Current  Smokeless Tobacco Never  Tobacco Comments   5 cigs  per day   Social History   Substance and Sexual Activity  Alcohol Use No   Alcohol/week: 0.0 standard drinks of alcohol   Comment: "last drink of alcohol ~ 1977"   Social History   Substance and Sexual Activity  Drug Use No    Allergies: Allergies  Allergen Reactions   Hctz [Hydrochlorothiazide] Other (See Comments)    Dizziness, syncope; does NOT wish to take anymore    Medications: Current Outpatient Medications  Medication Sig Dispense Refill   acyclovir (ZOVIRAX) 400 MG tablet TAKE 1 TABLET BY MOUTH THREE TIMES DAILY AS NEEDED FOR OUTBREAKS FOR 7 DAYS 21 tablet 5   alendronate (FOSAMAX) 70 MG tablet Take 1 tablet (70 mg total) by mouth every Sunday. TAKE 1 TABLET BY MOUTH ONCE WEEKLY ON SUNDAY take with a full glass of water on an empty stomach Strength: 70 mg 12 tablet 3   ALPRAZolam (XANAX) 0.5 MG tablet TAKE 2 TO 3 TABLETS BY MOUTH AT BEDTIME AS NEEDED FOR anxiety/sleep 90 tablet 1   amLODipine (NORVASC) 10 MG tablet Take 1 tablet (10 mg total) by mouth daily. 90 tablet 3   aspirin EC 81 MG tablet Take 1 tablet (81 mg total) by mouth daily with breakfast. 30 tablet 5   atorvastatin (LIPITOR) 10 MG tablet Take 1 tablet (10 mg total) by mouth at bedtime. 90 tablet 3   benazepril (LOTENSIN) 40 MG tablet Take 1 tablet (40 mg total) by mouth daily. 90 tablet 3   bictegravir-emtricitabine-tenofovir AF (BIKTARVY) 50-200-25 MG TABS tablet Take 1 tablet by mouth daily. 30 tablet 11   clopidogrel (PLAVIX) 75 MG tablet Take 1 tablet (75 mg total) by mouth daily. 90 tablet 3   diclofenac Sodium (VOLTAREN) 1 % GEL Apply 2 g topically 4 (four) times daily. APPLY TWO GRAMS TO AFFECTED AREA(S) FOUR TIMES DAILY Strength: 1 % 350 g 2   DIPHENHYDRAMINE HCL, TOPICAL, (BENADRYL ITCH STOPPING) 2 % GEL Apply 1 Application topically every 4 (four) hours as needed (symptoms of bugs on scalp). 57 g 3   dorzolamide-timolol (COSOPT) 2-0.5 % ophthalmic solution Place 1 drop into the right eye 2 (two)  times daily.     ferrous sulfate 325 (65 FE) MG tablet Take 1 tablet (325 mg total) by mouth daily with breakfast.     gabapentin (NEURONTIN) 300 MG capsule Take 1 capsule (300 mg total) by mouth 3 (three) times daily. 270 capsule 3   oxyCODONE-acetaminophen (PERCOCET/ROXICET) 5-325 MG tablet Take 1 tablet  by mouth every 8 (eight) hours as needed for severe pain (pain score 7-10). 60 tablet 0   thiamine (VITAMIN B1) 100 MG tablet Take 1 tablet (100 mg total) by mouth daily. 90 tablet 3   No current facility-administered medications for this visit.    Review of Systems: GENERAL: negative for malaise, night sweats HEENT: No changes in hearing or vision, no nose bleeds or other nasal problems. NECK: Negative for lumps, goiter, pain and significant neck swelling RESPIRATORY: Negative for cough, wheezing CARDIOVASCULAR: Negative for chest pain, leg swelling, palpitations, orthopnea GI: SEE HPI MUSCULOSKELETAL: Negative for joint pain or swelling, back pain, and muscle pain. SKIN: Negative for lesions, rash HEMATOLOGY Negative for prolonged bleeding, bruising easily, and swollen nodes. ENDOCRINE: Negative for cold or heat intolerance, polyuria, polydipsia and goiter. NEURO: negative for tremor, gait imbalance, syncope and seizures. The remainder of the review of systems is noncontributory.   Physical Exam: BP 132/73 (BP Location: Right Arm, Patient Position: Sitting, Cuff Size: Normal)   Pulse 80   Temp 98.2 F (36.8 C) (Oral)   Ht 5\' 7"  (1.702 m)   Wt 122 lb 9.6 oz (55.6 kg)   BMI 19.20 kg/m  GENERAL: The patient is AO x3, in no acute distress. HEENT: Head is normocephalic and atraumatic. EOMI are intact. Mouth is well hydrated and without lesions. NECK: Supple. No masses LUNGS: Clear to auscultation. No presence of rhonchi/wheezing/rales. Adequate chest expansion HEART: RRR, normal s1 and s2. ABDOMEN: Soft, nontender, no guarding, no peritoneal signs, and nondistended. BS +. No  masses.  Imaging/Labs: as above     Latest Ref Rng & Units 01/19/2023   10:21 AM 12/31/2022    4:31 AM 12/30/2022    3:49 AM  CBC  WBC 4.0 - 10.5 K/uL 6.1  4.5  4.5   Hemoglobin 12.0 - 15.0 g/dL 16.1  9.9  9.8   Hematocrit 36.0 - 46.0 % 35.1  31.3  30.0   Platelets 150 - 400 K/uL 370  265  274    Lab Results  Component Value Date   IRON 18 (L) 12/28/2022   TIBC 486 (H) 12/28/2022   FERRITIN 9 (L) 12/28/2022    I personally reviewed and interpreted the available labs, imaging and endoscopic files.  Impression and Plan:  Jessica [name] is a 82 y.o. 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  Upon further questioning today patient reports right before upper endoscopy which was done inpatient , patient few bites of food which she did not report to anyone and hence explain foods seen in the esophagus  She does not have dysphagia or odynophagia and is asymptomatic.  No further workup is needed for food seen in the esophagus during procedure as it was due to eating right before the procedure  #Severe Iron deficiency anemia  Patient was seen as inpatient on 12/12/2022 where she was found to have profound anemia with presence of possible melena.  Lab at that time demonstrated hemoglobin 7.6 ferritin.  Patient underwent inpatient upper endoscopy colonoscopy and capsule endoscopy which were negative to explain iron deficiency anemia. Esophagogastroduodenoscopy  showed nodularity in the stomach and small mount of food in the esophagus and body.  Colonoscopy showed  diverticulosis and scant amount of dark brown stool but no presence of active bleeding or any source of recent bleeding.    Capsule endoscopy was also negative .  Patient underwent 2 unit PRBC transfusion with adequate response.  Gastric biopsies are negative for H. Pylori  Patient continues to take p.o. iron but is encountering constipation.  Given severe iron deficiency anemia (ferritin 9) without any overt bleeding and negative GI workup will refer the patient to hematology  No absolute GI contraindication for continuing clinically indicated antiplatelet or anticoagulation given no overt bleed has been identified  #Constipation  This is drug-induced constipation due to iron supplementation  Ensure adequate fluid intake: Aim for 8 glasses of water daily. Follow a high fiber diet: Include foods such as dates, prunes, pears, and kiwi. Use Metamucil twice a day.  All questions were answered.      Vista Lawman, MD Gastroenterology and Hepatology [facility]   This chart has been completed using [facility] Dictation software, and while attempts have been made to ensure accuracy , certain words and phrases may not be transcribed as intended

## 2023-02-15 DIAGNOSIS — R9431 Abnormal electrocardiogram [ECG] [EKG]: Secondary | ICD-10-CM | POA: Diagnosis not present

## 2023-02-15 DIAGNOSIS — Z7983 Long term (current) use of bisphosphonates: Secondary | ICD-10-CM | POA: Diagnosis not present

## 2023-02-15 DIAGNOSIS — F17210 Nicotine dependence, cigarettes, uncomplicated: Secondary | ICD-10-CM | POA: Diagnosis not present

## 2023-02-15 DIAGNOSIS — S0990XA Unspecified injury of head, initial encounter: Secondary | ICD-10-CM | POA: Diagnosis not present

## 2023-02-15 DIAGNOSIS — I10 Essential (primary) hypertension: Secondary | ICD-10-CM | POA: Diagnosis not present

## 2023-02-15 DIAGNOSIS — R0781 Pleurodynia: Secondary | ICD-10-CM | POA: Diagnosis not present

## 2023-02-15 DIAGNOSIS — Z8673 Personal history of transient ischemic attack (TIA), and cerebral infarction without residual deficits: Secondary | ICD-10-CM | POA: Diagnosis not present

## 2023-02-15 DIAGNOSIS — R0789 Other chest pain: Secondary | ICD-10-CM | POA: Diagnosis not present

## 2023-02-15 DIAGNOSIS — K5900 Constipation, unspecified: Secondary | ICD-10-CM | POA: Diagnosis not present

## 2023-02-15 DIAGNOSIS — W0110XA Fall on same level from slipping, tripping and stumbling with subsequent striking against unspecified object, initial encounter: Secondary | ICD-10-CM | POA: Diagnosis not present

## 2023-02-15 DIAGNOSIS — I6789 Other cerebrovascular disease: Secondary | ICD-10-CM | POA: Diagnosis not present

## 2023-02-15 DIAGNOSIS — R55 Syncope and collapse: Secondary | ICD-10-CM | POA: Diagnosis not present

## 2023-02-15 DIAGNOSIS — I6782 Cerebral ischemia: Secondary | ICD-10-CM | POA: Diagnosis not present

## 2023-02-15 DIAGNOSIS — Z7982 Long term (current) use of aspirin: Secondary | ICD-10-CM | POA: Diagnosis not present

## 2023-03-19 NOTE — Progress Notes (Incomplete)
   [facility] [address], [place] [address]   Clinic Day:  03/19/2023  Referring physician: Franky Macho, MD  Patient Care Team: Miguel Aschoff, MD as PCP - General (Internal Medicine) Daiva Eves, Lisette Grinder, MD (Inf

## 2023-03-20 ENCOUNTER — Inpatient Hospital Stay: Payer: Medicare Other

## 2023-03-20 ENCOUNTER — Inpatient Hospital Stay: Payer: Medicare Other | Admitting: Hematology

## 2023-03-25 ENCOUNTER — Other Ambulatory Visit: Payer: Self-pay | Admitting: Internal Medicine

## 2023-03-25 DIAGNOSIS — F411 Generalized anxiety disorder: Secondary | ICD-10-CM

## 2023-03-28 DIAGNOSIS — H401133 Primary open-angle glaucoma, bilateral, severe stage: Secondary | ICD-10-CM | POA: Diagnosis not present

## 2023-03-29 ENCOUNTER — Encounter: Payer: Medicare Other | Admitting: Internal Medicine

## 2023-04-02 DIAGNOSIS — M79671 Pain in right foot: Secondary | ICD-10-CM | POA: Diagnosis not present

## 2023-04-02 DIAGNOSIS — L110 Acquired keratosis follicularis: Secondary | ICD-10-CM | POA: Diagnosis not present

## 2023-04-02 DIAGNOSIS — I739 Peripheral vascular disease, unspecified: Secondary | ICD-10-CM | POA: Diagnosis not present

## 2023-04-02 DIAGNOSIS — M79674 Pain in right toe(s): Secondary | ICD-10-CM | POA: Diagnosis not present

## 2023-04-02 DIAGNOSIS — M79675 Pain in left toe(s): Secondary | ICD-10-CM | POA: Diagnosis not present

## 2023-04-02 DIAGNOSIS — M79672 Pain in left foot: Secondary | ICD-10-CM | POA: Diagnosis not present

## 2023-04-03 ENCOUNTER — Other Ambulatory Visit: Payer: Self-pay

## 2023-04-04 ENCOUNTER — Inpatient Hospital Stay: Payer: Medicare Other | Attending: Oncology | Admitting: Oncology

## 2023-04-04 ENCOUNTER — Inpatient Hospital Stay: Payer: Medicare Other

## 2023-04-04 VITALS — BP 123/65 | HR 69 | Temp 98.3°F | Resp 18 | Ht 66.5 in | Wt 116.8 lb

## 2023-04-04 DIAGNOSIS — D500 Iron deficiency anemia secondary to blood loss (chronic): Secondary | ICD-10-CM | POA: Diagnosis not present

## 2023-04-04 DIAGNOSIS — Z7902 Long term (current) use of antithrombotics/antiplatelets: Secondary | ICD-10-CM | POA: Diagnosis not present

## 2023-04-04 DIAGNOSIS — K922 Gastrointestinal hemorrhage, unspecified: Secondary | ICD-10-CM

## 2023-04-04 DIAGNOSIS — I10 Essential (primary) hypertension: Secondary | ICD-10-CM | POA: Diagnosis not present

## 2023-04-04 DIAGNOSIS — Z21 Asymptomatic human immunodeficiency virus [HIV] infection status: Secondary | ICD-10-CM | POA: Insufficient documentation

## 2023-04-04 DIAGNOSIS — F17200 Nicotine dependence, unspecified, uncomplicated: Secondary | ICD-10-CM | POA: Diagnosis not present

## 2023-04-04 DIAGNOSIS — K5900 Constipation, unspecified: Secondary | ICD-10-CM | POA: Insufficient documentation

## 2023-04-04 DIAGNOSIS — Z79891 Long term (current) use of opiate analgesic: Secondary | ICD-10-CM | POA: Diagnosis not present

## 2023-04-04 DIAGNOSIS — F419 Anxiety disorder, unspecified: Secondary | ICD-10-CM | POA: Diagnosis not present

## 2023-04-04 DIAGNOSIS — D509 Iron deficiency anemia, unspecified: Secondary | ICD-10-CM | POA: Insufficient documentation

## 2023-04-04 DIAGNOSIS — Z79899 Other long term (current) drug therapy: Secondary | ICD-10-CM | POA: Diagnosis not present

## 2023-04-04 DIAGNOSIS — E538 Deficiency of other specified B group vitamins: Secondary | ICD-10-CM

## 2023-04-04 DIAGNOSIS — Z720 Tobacco use: Secondary | ICD-10-CM

## 2023-04-04 DIAGNOSIS — Z79624 Long term (current) use of inhibitors of nucleotide synthesis: Secondary | ICD-10-CM | POA: Diagnosis not present

## 2023-04-04 HISTORY — DX: Iron deficiency anemia secondary to blood loss (chronic): D50.0

## 2023-04-04 LAB — LACTATE DEHYDROGENASE: LDH: 159 U/L (ref 98–192)

## 2023-04-04 LAB — CBC WITH DIFFERENTIAL/PLATELET
Abs Immature Granulocytes: 0.01 10*3/uL (ref 0.00–0.07)
Basophils Absolute: 0 10*3/uL (ref 0.0–0.1)
Basophils Relative: 1 %
Eosinophils Absolute: 0.1 10*3/uL (ref 0.0–0.5)
Eosinophils Relative: 2 %
HCT: 30.2 % — ABNORMAL LOW (ref 36.0–46.0)
Hemoglobin: 9.1 g/dL — ABNORMAL LOW (ref 12.0–15.0)
Immature Granulocytes: 0 %
Lymphocytes Relative: 47 %
Lymphs Abs: 2.8 10*3/uL (ref 0.7–4.0)
MCH: 29.2 pg (ref 26.0–34.0)
MCHC: 30.1 g/dL (ref 30.0–36.0)
MCV: 96.8 fL (ref 80.0–100.0)
Monocytes Absolute: 0.3 10*3/uL (ref 0.1–1.0)
Monocytes Relative: 6 %
Neutro Abs: 2.6 10*3/uL (ref 1.7–7.7)
Neutrophils Relative %: 44 %
Platelets: 273 10*3/uL (ref 150–400)
RBC: 3.12 MIL/uL — ABNORMAL LOW (ref 3.87–5.11)
RDW: 15.9 % — ABNORMAL HIGH (ref 11.5–15.5)
WBC: 5.8 10*3/uL (ref 4.0–10.5)
nRBC: 0 % (ref 0.0–0.2)

## 2023-04-04 LAB — COMPREHENSIVE METABOLIC PANEL
ALT: 10 U/L (ref 0–44)
AST: 20 U/L (ref 15–41)
Albumin: 3.7 g/dL (ref 3.5–5.0)
Alkaline Phosphatase: 56 U/L (ref 38–126)
Anion gap: 9 (ref 5–15)
BUN: 14 mg/dL (ref 8–23)
CO2: 21 mmol/L — ABNORMAL LOW (ref 22–32)
Calcium: 9.3 mg/dL (ref 8.9–10.3)
Chloride: 112 mmol/L — ABNORMAL HIGH (ref 98–111)
Creatinine, Ser: 0.64 mg/dL (ref 0.44–1.00)
GFR, Estimated: >60 mL/min (ref >60)
Glucose, Bld: 90 mg/dL (ref 70–99)
Potassium: 4.1 mmol/L (ref 3.5–5.1)
Sodium: 142 mmol/L (ref 135–145)
Total Bilirubin: 0.3 mg/dL (ref 0.0–1.2)
Total Protein: 6.7 g/dL (ref 6.5–8.1)

## 2023-04-04 LAB — FERRITIN: Ferritin: 60 ng/mL (ref 11–307)

## 2023-04-04 LAB — FOLATE: Folate: 26.8 ng/mL (ref >5.9)

## 2023-04-04 LAB — VITAMIN B12: Vitamin B-12: 369 pg/mL (ref 180–914)

## 2023-04-04 MED ORDER — VITAMIN B-12 1000 MCG PO TABS: 1000.0000 ug | ORAL_TABLET | Freq: Every day | ORAL | 2 refills | Status: AC

## 2023-04-04 NOTE — Patient Instructions (Signed)
VISIT SUMMARY:  You came in today for a follow-up visit after your recent hospitalization for severe anemia. Despite extensive testing, the cause of your anemia remains unclear. You are currently taking iron and vitamin B1 supplements. You also reported experiencing fatigue and constipation. We discussed your smoking habit and your ability to perform daily activities.  YOUR PLAN:  -SEVERE ANEMIA: Severe anemia means you have a very low level of hemoglobin in your blood, which can cause fatigue and other symptoms. We will check your current hemoglobin and iron levels today. Depending on the results and insurance approval, we may start you on IV iron infusions. We are also changing your oral iron supplements to every other day to help with constipation.  -VITAMIN B12 DEFICIENCY: Vitamin B12 deficiency means you have low levels of vitamin B12, which is important for nerve function and making red blood cells. We will check your B12 levels today and start you on daily B12 supplements. If your levels remain low, we may consider B12 injections.  -GENERAL HEALTH MAINTENANCE: We will monitor your anemia and potential bleeding. Routine labs will be checked, and we will review your iron and B12 levels before your next visit. Please return in 6 weeks for follow-up.  INSTRUCTIONS:  Please return in 6 weeks to monitor your anemia and potential bleeding. We will check your iron and B12 levels before your next visit. If approved, we will schedule your IV iron infusion.

## 2023-04-04 NOTE — Assessment & Plan Note (Signed)
Low levels previously noted, currently taking B1 (thiamine) but not B12. -Check B12 levels today. -Start oral B12 supplementation daily, to be sent to pharmacy. -If levels remain low, consider B12 injections.

## 2023-04-04 NOTE — Progress Notes (Signed)
[facility] at [facility] HEMATOLOGY NEW VISIT  Miguel Aschoff, MD  REASON FOR REFERRAL: Iron Deficiency Anemia  HISTORY OF PRESENT ILLNESS: Jessica [name] 83 y.o. female referred for iron deficiency anemia.  She is accompanied by her daughter today.  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  Patient had a recent hospitalization for severe anemia with a hemoglobin of 5.8.  She received blood transfusion during this time and had a colonoscopy endoscopy done which did not show any signs of active bleeding.  She was started on iron supplementation.  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  She denies any weight loss, loss of appetite.  She does report some constipation.  Patient is a current smoker with no intention to quit.She lives with family and is able to perform most activities of daily living independently, although she reports difficulty with dressing.  She reports lung cancer in brother.  I have reviewed the past medical history, past surgical history, social history and family history with the patient   ALLERGIES:  is allergic to hctz [hydrochlorothiazide].  MEDICATIONS:  Current Outpatient Medications  Medication Sig Dispense Refill   acyclovir (ZOVIRAX) 400 MG tablet TAKE 1 TABLET BY MOUTH THREE TIMES DAILY AS NEEDED FOR OUTBREAKS FOR 7 DAYS 21 tablet 5   alendronate (FOSAMAX) 70 MG tablet Take 1 tablet (70 mg total) by mouth every Sunday. TAKE 1 TABLET BY MOUTH ONCE WEEKLY ON SUNDAY take with a full glass of water on an empty stomach Strength: 70 mg 12 tablet 3   ALPRAZolam (XANAX) 0.5 MG tablet TAKE 2 TO 3 TABLETS BY MOUTH AT BEDTIME AS NEEDED FOR anxiety/sleep 90 tablet 1   amLODipine (NORVASC) 10 MG tablet Take 1 tablet (10 mg total) by mouth daily. 90  tablet 3   aspirin EC 81 MG tablet Take 1 tablet (81 mg total) by mouth daily with breakfast. 30 tablet 5   atorvastatin (LIPITOR) 10 MG tablet Take 1 tablet (10 mg total) by mouth at bedtime. 90 tablet 3   benazepril (LOTENSIN) 40 MG tablet Take 1 tablet (40 mg total) by mouth daily. 90 tablet 3   bictegravir-emtricitabine-tenofovir AF (BIKTARVY) 50-200-25 MG TABS tablet Take 1 tablet by mouth daily. 30 tablet 11   clopidogrel (PLAVIX) 75 MG tablet Take 1 tablet (75 mg total) by mouth daily. 90 tablet 3   cyanocobalamin (VITAMIN B12) 1000 MCG tablet Take 1 tablet (1,000 mcg total) by mouth daily. 30 tablet 2   diclofenac Sodium (VOLTAREN) 1 % GEL Apply 2 g topically 4 (four) times daily. APPLY TWO GRAMS TO AFFECTED AREA(S) FOUR TIMES DAILY Strength: 1 % 350 g 2   DIPHENHYDRAMINE HCL, TOPICAL, (BENADRYL ITCH STOPPING) 2 % GEL Apply 1 Application topically every 4 (four) hours as needed (symptoms of bugs on scalp). 57 g 3   dorzolamide-timolol (COSOPT) 2-0.5 % ophthalmic solution Place 1 drop into the right eye 2 (two) times daily.     ferrous sulfate 325 (65 FE) MG tablet Take 1 tablet (325 mg total) by mouth daily with breakfast.     gabapentin (NEURONTIN) 300 MG capsule Take 1 capsule (300 mg total) by mouth 3 (three) times daily. 270 capsule 3   oxyCODONE-acetaminophen (PERCOCET/ROXICET) 5-325 MG tablet Take 1 tablet by mouth every 8 (eight) hours as needed for  severe pain (pain score 7-10). 60 tablet 0   thiamine (VITAMIN B1) 100 MG tablet Take 1 tablet (100 mg total) by mouth daily. 90 tablet 3   No current facility-administered medications for this visit.     REVIEW OF SYSTEMS:   Constitutional: Denies fevers, chills or night sweats Eyes: Denies blurriness of vision Ears, nose, mouth, throat, and face: Denies mucositis or sore throat Respiratory: Denies cough, dyspnea or wheezes Cardiovascular: Denies palpitation, chest discomfort or lower extremity swelling Gastrointestinal:  Denies  nausea, heartburn or change in bowel habits Skin: Denies abnormal skin rashes Lymphatics: Denies new lymphadenopathy or easy bruising Neurological:Denies numbness, tingling or new weaknesses Behavioral/Psych: Mood is stable, no new changes  All other systems were reviewed with the patient and are negative.  PHYSICAL EXAMINATION:   Vitals:   04/04/23 1309  BP: 123/65  Pulse: 69  Resp: 18  Temp: 98.3 F (36.8 C)  SpO2: 99%    GENERAL:alert, no distress and comfortable SKIN: skin color- pale, texture, turgor are normal, no rashes or significant lesions LUNGS: clear to auscultation and percussion with normal breathing effort HEART: regular rate & rhythm and no murmurs and no lower extremity edema ABDOMEN:abdomen soft, non-tender and normal bowel sounds Musculoskeletal:no cyanosis of digits and no clubbing  NEURO: alert & oriented x 3 with fluent speech  LABORATORY DATA:  I have reviewed the data as listed  Lab Results  Component Value Date   WBC 5.8 04/04/2023   NEUTROABS 2.6 04/04/2023   HGB 9.1 (L) 04/04/2023   HCT 30.2 (L) 04/04/2023   MCV 96.8 04/04/2023   PLT 273 04/04/2023      Component Value Date/Time   NA 142 04/04/2023 1343   NA 142 12/30/2021 0946   K 4.1 04/04/2023 1343   CL 112 (H) 04/04/2023 1343   CO2 21 (L) 04/04/2023 1343   GLUCOSE 90 04/04/2023 1343   BUN 14 04/04/2023 1343   BUN 14 12/30/2021 0946   CREATININE 0.64 04/04/2023 1343   CREATININE 0.73 12/04/2022 1345   CALCIUM 9.3 04/04/2023 1343   PROT 6.7 04/04/2023 1343   PROT 7.6 03/16/2017 1006   ALBUMIN 3.7 04/04/2023 1343   ALBUMIN 4.3 03/16/2017 1006   AST 20 04/04/2023 1343   ALT 10 04/04/2023 1343   ALKPHOS 56 04/04/2023 1343   BILITOT 0.3 04/04/2023 1343   BILITOT 0.3 03/16/2017 1006   GFRNONAA >60 04/04/2023 1343   GFRNONAA 70 09/15/2020 1048   GFRAA 81 09/15/2020 1048       Chemistry      Component Value Date/Time   NA 142 04/04/2023 1343   NA 142 12/30/2021 0946   K  4.1 04/04/2023 1343   CL 112 (H) 04/04/2023 1343   CO2 21 (L) 04/04/2023 1343   BUN 14 04/04/2023 1343   BUN 14 12/30/2021 0946   CREATININE 0.64 04/04/2023 1343   CREATININE 0.73 12/04/2022 1345      Component Value Date/Time   CALCIUM 9.3 04/04/2023 1343   ALKPHOS 56 04/04/2023 1343   AST 20 04/04/2023 1343   ALT 10 04/04/2023 1343   BILITOT 0.3 04/04/2023 1343   BILITOT 0.3 03/16/2017 1006      ASSESSMENT & PLAN:  Patient is an 82 year old female who newly diagnosed iron deficiency anemia likely secondary to GI bleed.    Iron deficiency anemia due to chronic blood loss Hemoglobin previously at 5.8 with unclear etiology. No active bleeding or dark stools reported.  Endoscopy and colonoscopy did not show active  signs of bleeding iron levels are very low, indicating possible iron deficiency anemia. -Order labs today to check current hemoglobin and iron levels. -Plan for IV iron infusion pending insurance approval and lab results.  Discussed the risks versus benefits of IV iron. -Change oral iron supplementation to every other day due to constipation.  Vitamin B12 deficiency Low levels previously noted, currently taking B1 (thiamine) but not B12. -Check B12 levels today. -Start oral B12 supplementation daily, to be sent to pharmacy. -If levels remain low, consider B12 injections.  Tobacco use Patient is a chronic smoker with no intention to quit -Discussed with risks of smoking in detail and encouraged to quit   Orders Placed This Encounter  Procedures   Comprehensive metabolic panel    Standing Status:   Future    Number of Occurrences:   1    Expected Date:   04/04/2023    Expiration Date:   04/03/2024   CBC with Differential/Platelet    Standing Status:   Future    Number of Occurrences:   1    Expected Date:   04/04/2023    Expiration Date:   04/03/2024   Ferritin    Standing Status:   Future    Number of Occurrences:   1    Expected Date:   04/04/2023     Expiration Date:   04/03/2024   Vitamin B12    Standing Status:   Future    Number of Occurrences:   1    Expected Date:   04/04/2023    Expiration Date:   04/03/2024   Haptoglobin    Standing Status:   Future    Number of Occurrences:   1    Expected Date:   04/04/2023    Expiration Date:   04/03/2024   Lactate dehydrogenase    Standing Status:   Future    Number of Occurrences:   1    Expected Date:   04/04/2023    Expiration Date:   04/03/2024   Folate    Standing Status:   Future    Number of Occurrences:   1    Expected Date:   04/04/2023    Expiration Date:   04/03/2024    The total time spent in the appointment was 40 minutes encounter with patients including review of chart and various tests results, discussions about plan of care and coordination of care plan   All questions were answered. The patient knows to call the clinic with any problems, questions or concerns. No barriers to learning was detected.   Cindie Crumbly, MD 1/22/20255:06 PM

## 2023-04-04 NOTE — Assessment & Plan Note (Signed)
Hemoglobin previously at 5.8 with unclear etiology. No active bleeding or dark stools reported.  Endoscopy and colonoscopy did not show active signs of bleeding iron levels are very low, indicating possible iron deficiency anemia. -Order labs today to check current hemoglobin and iron levels. -Plan for IV iron infusion pending insurance approval and lab results.  Discussed the risks versus benefits of IV iron. -Change oral iron supplementation to every other day due to constipation.

## 2023-04-04 NOTE — Assessment & Plan Note (Signed)
>>  ASSESSMENT AND PLAN FOR TOBACCO USE WRITTEN ON 04/04/2023  5:06 PM BY KANDALA, HYNDAVI, MD  Patient is a chronic smoker with no intention to quit -Discussed with risks of smoking in detail and encouraged to quit

## 2023-04-04 NOTE — Assessment & Plan Note (Signed)
Patient is a chronic smoker with no intention to quit -Discussed with risks of smoking in detail and encouraged to quit

## 2023-04-05 ENCOUNTER — Ambulatory Visit: Payer: Medicare Other | Admitting: Internal Medicine

## 2023-04-05 ENCOUNTER — Ambulatory Visit (INDEPENDENT_AMBULATORY_CARE_PROVIDER_SITE_OTHER): Payer: Medicare Other | Admitting: Family Medicine

## 2023-04-05 ENCOUNTER — Encounter: Payer: Self-pay | Admitting: Family Medicine

## 2023-04-05 VITALS — BP 105/51 | HR 64 | Temp 98.3°F | Ht 66.5 in | Wt 115.7 lb

## 2023-04-05 VITALS — BP 132/77 | Ht 66.75 in | Wt 116.0 lb

## 2023-04-05 DIAGNOSIS — F411 Generalized anxiety disorder: Secondary | ICD-10-CM

## 2023-04-05 DIAGNOSIS — G4700 Insomnia, unspecified: Secondary | ICD-10-CM | POA: Diagnosis not present

## 2023-04-05 DIAGNOSIS — I10 Essential (primary) hypertension: Secondary | ICD-10-CM | POA: Diagnosis not present

## 2023-04-05 DIAGNOSIS — E782 Mixed hyperlipidemia: Secondary | ICD-10-CM | POA: Diagnosis not present

## 2023-04-05 DIAGNOSIS — R351 Nocturia: Secondary | ICD-10-CM | POA: Diagnosis not present

## 2023-04-05 DIAGNOSIS — Z9181 History of falling: Secondary | ICD-10-CM | POA: Diagnosis not present

## 2023-04-05 DIAGNOSIS — M5416 Radiculopathy, lumbar region: Secondary | ICD-10-CM

## 2023-04-05 DIAGNOSIS — D500 Iron deficiency anemia secondary to blood loss (chronic): Secondary | ICD-10-CM

## 2023-04-05 DIAGNOSIS — M1612 Unilateral primary osteoarthritis, left hip: Secondary | ICD-10-CM

## 2023-04-05 DIAGNOSIS — H543 Unqualified visual loss, both eyes: Secondary | ICD-10-CM

## 2023-04-05 DIAGNOSIS — F22 Delusional disorders: Secondary | ICD-10-CM

## 2023-04-05 LAB — HAPTOGLOBIN: Haptoglobin: 46 mg/dL (ref 42–346)

## 2023-04-05 MED ORDER — PREDNISONE 10 MG PO TABS: ORAL_TABLET | ORAL | 0 refills | Status: DC

## 2023-04-05 MED ORDER — ALPRAZOLAM 0.5 MG PO TABS: 0.5000 mg | ORAL_TABLET | Freq: Every day | ORAL | 1 refills | Status: DC

## 2023-04-05 MED ORDER — OXYCODONE-ACETAMINOPHEN 5-325 MG PO TABS: 1.0000 | ORAL_TABLET | Freq: Three times a day (TID) | ORAL | 0 refills | Status: DC | PRN

## 2023-04-05 MED ORDER — METHYLPREDNISOLONE ACETATE 80 MG/ML IJ SUSP
80.0000 mg | Freq: Once | INTRAMUSCULAR | Status: AC
  Administered 2023-04-05: 80 mg via INTRAMUSCULAR

## 2023-04-05 NOTE — Assessment & Plan Note (Addendum)
Low BP- stop amlodipine Alprazolam and opioids - work on slow reduction of alprazolam first Blindness - Will order a BSC and shower chair Hip pain - will see Sports Med physician right after this appt

## 2023-04-05 NOTE — Progress Notes (Signed)
83 y.o. Jessica [name] is here for routine follow-up of a number of chronic conditions; I am taking over her care upon the departure of her PCP, though I had [facility].  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  She has lost sight in her left eye and can see large shapes with her right eye defined by light and dark contrast.   Vision is being managed at [facility].     Jessica [name] last visit to [facility] was with Dr. Criselda Peaches in 01/2023. At that time she was recovering from a GIB presumed diverticular for which she was hospitalized. Her iron was noted to be low, Fe was prescribed, and recheck is intended today.  At that visit Jessica [name] was experiencing parasitosis (she also has [name] syndrome; barely sees deep contrasts).   Since last visit patient has seen her [facility] ophthalmologist with following recommendations: - Continue Cosopt BID OD - increase to TID  - Continue Brimonidine TID right eye  - resume latanoprost at bedtime both eyes  RTC 2-3 months V/T consider sulcus tube vs other PRN   She was also evaluated in the ED after a fall.   Yesterday she was evaluated by hematologist for Fe deficiency anemia; repeat labs were obtained.    Constipated - try prunes and full glass of water Nighttime bathroom - try [facility] Shower chair  Head feeling better, feeling more like herself.  She has had no recent parasitosis delusions and infrequent visual hallucinations - had one episode of a hallucinated dog in her house, which disappeared quickly and was not concerning.    Chews ice  L hip has really been hurting, making it uncomfortable to walk- requested referral to Sports Medicine today for a hip  injection.   Medications discussed: she voices needing alprazolam for sleep.    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   Insomnia 10/20/2019   Opioid dependence with current use (HCC) 04/12/2018   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 01/04/2018   Diastolic dysfunction 09/28/2017   Marijuana use 07/03/2016   Bilateral sensorineural hearing loss 06/11/2014   Subjective tinnitus of both ears 05/18/2014   Right rotator cuff tear 02/01/2013   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   Generalized anxiety disorder 04/05/2012   Healthcare maintenance 04/05/2012   Mixed hyperlipidemia 04/05/2012   Smoker 02/19/2012   History of small bowel obstruction 02/08/2012   PAD (peripheral artery disease) (HCC) 11/01/2011   Constipation due to pain medication 04/27/2010   Genital herpes 07/20/2006   Glaucoma of left eye 07/20/2006   Essential hypertension 07/20/2006   Lumbar degenerative disc disease 07/20/2006   History of abnormal cervical Pap  smear 07/20/2006   HIV (human immunodeficiency virus infection) (HCC) 03/27/1986    Current Outpatient Medications:    acyclovir (ZOVIRAX) 400 MG tablet, TAKE 1 TABLET BY MOUTH THREE TIMES DAILY AS NEEDED FOR OUTBREAKS FOR 7 DAYS, Disp: 21 tablet, Rfl: 5   alendronate (FOSAMAX) 70 MG tablet, Take 1 tablet (70 mg total) by mouth every Sunday. TAKE 1  TABLET BY MOUTH ONCE WEEKLY ON SUNDAY take with a full glass of water on an empty stomach Strength: 70 mg, Disp: 12 tablet, Rfl: 3   ALPRAZolam (XANAX) 0.5 MG tablet, Take 1 tablet (0.5 mg total) by mouth at bedtime., Disp: 40 tablet, Rfl: 1   aspirin EC 81 MG tablet, Take 1 tablet (81 mg total) by mouth daily with breakfast., Disp: 30 tablet, Rfl: 5   atorvastatin (LIPITOR) 10 MG tablet, Take 1 tablet (10 mg total) by mouth at bedtime., Disp: 90 tablet, Rfl: 3   benazepril (LOTENSIN) 40 MG tablet, Take 1 tablet (40 mg total) by mouth daily., Disp: 90 tablet, Rfl: 3   bictegravir-emtricitabine-tenofovir AF (BIKTARVY) 50-200-25 MG TABS tablet, Take 1 tablet by mouth daily., Disp: 30 tablet, Rfl: 11   clopidogrel (PLAVIX) 75 MG tablet, Take 1 tablet (75 mg total) by mouth daily., Disp: 90 tablet, Rfl: 3   cyanocobalamin (VITAMIN B12) 1000 MCG tablet, Take 1 tablet (1,000 mcg total) by mouth daily., Disp: 30 tablet, Rfl: 2   diclofenac Sodium (VOLTAREN) 1 % GEL, Apply 2 g topically 4 (four) times daily. APPLY TWO GRAMS TO AFFECTED AREA(S) FOUR TIMES DAILY Strength: 1 %, Disp: 350 g, Rfl: 2   DIPHENHYDRAMINE HCL, TOPICAL, (BENADRYL ITCH STOPPING) 2 % GEL, Apply 1 Application topically every 4 (four) hours as needed (symptoms of bugs on scalp)., Disp: 57 g, Rfl: 3   dorzolamide-timolol (COSOPT) 2-0.5 % ophthalmic solution, Place 1 drop into the right eye 2 (two) times daily., Disp: , Rfl:    ferrous sulfate 325 (65 FE) MG tablet, Take 1 tablet (325 mg total) by mouth daily with breakfast., Disp: , Rfl:    gabapentin (NEURONTIN) 300 MG capsule, Take 1 capsule (300 mg total) by mouth 3 (three) times daily., Disp: 270 capsule, Rfl: 3   oxyCODONE-acetaminophen (PERCOCET/ROXICET) 5-325 MG tablet, Take 1 tablet by mouth every 8 (eight) hours as needed for severe pain (pain score 7-10)., Disp: 60 tablet, Rfl: 0   predniSONE (DELTASONE) 10 MG tablet, Take as directed per MD instructions, Disp: 21 tablet, Rfl: 0    thiamine (VITAMIN B1) 100 MG tablet, Take 1 tablet (100 mg total) by mouth daily., Disp: 90 tablet, Rfl: 3  Functional Status: Jessica [name] lives alone.  She has a white cane. She is independent in ADLs, though has fear of falling in shower. Family assist with IADLs given her functional blindness.    Objective BP (!) 105/51 (BP Location: Left Arm, Patient Position: Sitting, Cuff Size: Small)   Pulse 64   Temp 98.3 F (36.8 C) (Oral)   Ht 5' 6.5" (1.689 m)   Wt 115 lb 11.2 oz (52.5 kg)   BMI 18.39 kg/m   Exam: Lovely lady, slender habitus.  Well-appearing woman arriving in transport chair. Mood is calm, euthymic, engaged. Good eye contact though she cannot see me clearly. L eye is cloudy with very mild ptosis. Motor tone completely normal; no rigidity, cogwheeling, or paratonia in UE or LE's. No tremor. Voice strong. Converses easily and recalls details of history. Language is full, diction clear, no word finding difficulties. Skin  turgor is poor.   Problems addressed today:  At high risk for falls Assessment & Plan: Low BP- stop amlodipine Alprazolam and opioids - work on slow reduction of alprazolam first Blindness - Will order a BSC and shower chair Hip pain - will see Sports Med physician right after this appt        Orders: -     For home use only DME Bedside commode -     For home use only DME Other see comment  Generalized anxiety disorder Assessment & Plan: Jessica [name] does not endorse feeling anxious.  She has taken the alprazolam for years, less than what is prescribed, used primarily to go to sleep, and likes to have some extra in case she gets upset about something or can't get to sleep.  We discussed the risks of falls and cognitive decline.  Her daughter is mostly concerned about having medication available if her mother becomes upset or anxious [place] and it [place] some time to get to her.  "We need it to keep her calm sometimes."  We  agreed to reduce the dispense to reflect what she is taking; rather then 0.5mg  "take 2-3 as needed", she will have prescription for 1 tab nightly and a few extra.  Goal is to gradually taper this off and replace with trazodone or other alternative.  Orders: -     ALPRAZolam; Take 1 tablet (0.5 mg total) by mouth at bedtime.  Dispense: 40 tablet; Refill: 1  Blindness of both eyes -     For home use only DME Bedside commode -     For home use only DME Other see comment  Nocturia more than twice per night Assessment & Plan: Tends to get lost at night. Will order a BSC to limit the distance she must travel.  Orders: -     For home use only DME Bedside commode  Iron deficiency anemia due to chronic blood loss Assessment & Plan: Hematology visit yesterday: repeat labs drawn and plans underway for repletion.  She reports ice pica. Not dizzy when standing.  No GI source of blood identied on upper/lower endoscopy; R sided self limited diverticular bleed possible.   Insomnia, unspecified type Assessment & Plan: See Generalized Anxiety  Mixed hyperlipidemia Assessment & Plan: Very tightly controlled on atorvastatin 10 mg; presumed for secondary prevention for PAD (asymptomatic).   Essential hypertension Assessment & Plan: BP low today at 105/51. Stop amlodipine. She continues on benazapril 40 mg daily.  Terazosin had been stopped some time ago (high association with falls).   Ekbom's delusional parasitosis (HCC) Assessment & Plan: Much improved , just in the past few weeks, w/o particular intervention.  Excoriated areas of her face are nearly healed.  She is feeling more like herself.   Other orders -     oxyCODONE-Acetaminophen; Take 1 tablet by mouth every 8 (eight) hours as needed for severe pain (pain score 7-10).  Dispense: 60 tablet; Refill: 0     Return in about 3 months (around 07/04/2023) for chronic condition monitoring, med review.

## 2023-04-05 NOTE — Telephone Encounter (Signed)
Filled in another encounter

## 2023-04-05 NOTE — Assessment & Plan Note (Signed)
Acute on chronic left-sided posterior hip pain, although she has underlying mild to moderate hip osteoarthritis presentation and history most consistent with recurrent lumbar radiculopathy  Plan: -If no improvement with above treatment, could consider potential of ultrasound-guided intra-articular hip injection in the future

## 2023-04-05 NOTE — Patient Instructions (Signed)
Stop amlodipine - your blood pressure is too low and you don't need as much medicine.  I'm excited for you to meet some new people in your group!  Let's get together in 2-3 months.  Take care!  Dr. Mayford Knife

## 2023-04-05 NOTE — Progress Notes (Signed)
DATE OF VISIT: 04/05/2023        Jessica [name] DOB: Mar 27, 1940 MRN: [id number]  CC:  Left hip pain  History of present Illness: Jessica [name] is a 83 y.o. female who presents for left hip pain Patient is legally blind and accompanied by daughter today  Previously seen by Dr Margaretha Sheffield in our office 09/05/22 and 07/26/21 with similar complaints - has known mild to moderate Lt hip OA - previously dx with Lt-sided lumbar radiculopathy.  Has responded well to IM Depo-medrol and Prednisone taper x 6 days  Reports similar symptoms to the left hip and leg to what she experienced previously. Pain in the posterior hip that radiates down to the knee Denies anterior hip or groin pain No associated numbness/tingling Denies lower extremity weakness Denies changes in bowel or bladder  Continues with Gabapentin 300mg  TID - helps with leg pain - started about 55-months ago for neuropathy  Medications:  Outpatient Encounter Medications as of 04/05/2023  Medication Sig   predniSONE (DELTASONE) 10 MG tablet Take as directed per MD instructions   acyclovir (ZOVIRAX) 400 MG tablet TAKE 1 TABLET BY MOUTH THREE TIMES DAILY AS NEEDED FOR OUTBREAKS FOR 7 DAYS   alendronate (FOSAMAX) 70 MG tablet Take 1 tablet (70 mg total) by mouth every Sunday. TAKE 1 TABLET BY MOUTH ONCE WEEKLY ON SUNDAY take with a full glass of water on an empty stomach Strength: 70 mg   ALPRAZolam (XANAX) 0.5 MG tablet Take 1 tablet (0.5 mg total) by mouth at bedtime.   aspirin EC 81 MG tablet Take 1 tablet (81 mg total) by mouth daily with breakfast.   atorvastatin (LIPITOR) 10 MG tablet Take 1 tablet (10 mg total) by mouth at bedtime.   benazepril (LOTENSIN) 40 MG tablet Take 1 tablet (40 mg total) by mouth daily.   bictegravir-emtricitabine-tenofovir AF (BIKTARVY) 50-200-25 MG TABS tablet Take 1 tablet by mouth daily.   clopidogrel (PLAVIX) 75 MG tablet Take 1 tablet (75 mg total) by mouth daily.   cyanocobalamin  (VITAMIN B12) 1000 MCG tablet Take 1 tablet (1,000 mcg total) by mouth daily.   diclofenac Sodium (VOLTAREN) 1 % GEL Apply 2 g topically 4 (four) times daily. APPLY TWO GRAMS TO AFFECTED AREA(S) FOUR TIMES DAILY Strength: 1 %   DIPHENHYDRAMINE HCL, TOPICAL, (BENADRYL ITCH STOPPING) 2 % GEL Apply 1 Application topically every 4 (four) hours as needed (symptoms of bugs on scalp).   dorzolamide-timolol (COSOPT) 2-0.5 % ophthalmic solution Place 1 drop into the right eye 2 (two) times daily.   ferrous sulfate 325 (65 FE) MG tablet Take 1 tablet (325 mg total) by mouth daily with breakfast.   gabapentin (NEURONTIN) 300 MG capsule Take 1 capsule (300 mg total) by mouth 3 (three) times daily.   oxyCODONE-acetaminophen (PERCOCET/ROXICET) 5-325 MG tablet Take 1 tablet by mouth every 8 (eight) hours as needed for severe pain (pain score 7-10).   thiamine (VITAMIN B1) 100 MG tablet Take 1 tablet (100 mg total) by mouth daily.   [EXPIRED] methylPREDNISolone acetate (DEPO-MEDROL) injection 80 mg    No facility-administered encounter medications on file as of 04/05/2023.    Allergies: is allergic to hctz [hydrochlorothiazide].  Physical Examination: Vitals: BP 132/77   Ht 5' 6.75" (1.695 m)   Wt 116 lb (52.6 kg)   BMI 18.30 kg/m  GENERAL:  Jessica [name] is a 83 y.o. female appearing their stated age, alert and oriented x 3, in no apparent distress.  MSK:  Left hip with good range of motion without pain.  No tenderness over the anterior or lateral hip.  Mild tenderness to palpation over the left posterior hip near the SI joint. Lumbar spine without any midline tenderness.  No significant paraspinal tenderness.  Negative SLR bilaterally.  Lower extremity strength 5 -/5 throughout bilaterally. 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  She was given Depo-Medrol IM 80 mg today.  Given prescription for prednisone Dosepak to take as directed over the next 6 days -If no improvement or if symptoms returning could consider imaging of the lumbar spine versus trial of ultrasound-guided intra-articular hip injection -Patient daughter expressed understanding and agreement.  All questions were answered Primary osteoarthritis of left hip Acute on chronic left-sided posterior hip pain, although she has underlying mild to moderate hip osteoarthritis presentation and history most consistent with recurrent lumbar radiculopathy  Plan: -If no improvement with above treatment, could consider potential of ultrasound-guided intra-articular hip injection in the future  Patient expressed understanding & agreement with above.  Encounter Diagnoses  Name Primary?   Lumbar radiculopathy Yes   Primary osteoarthritis of left hip     No orders of the defined types were placed in this encounter.

## 2023-04-06 ENCOUNTER — Other Ambulatory Visit: Payer: Self-pay | Admitting: Internal Medicine

## 2023-04-06 DIAGNOSIS — B029 Zoster without complications: Secondary | ICD-10-CM

## 2023-04-11 ENCOUNTER — Encounter: Payer: Self-pay | Admitting: Internal Medicine

## 2023-04-11 NOTE — Assessment & Plan Note (Signed)
Very tightly controlled on atorvastatin 10 mg; presumed for secondary prevention for PAD (asymptomatic).

## 2023-04-11 NOTE — Assessment & Plan Note (Addendum)
Jessica [name] does not endorse feeling anxious.  She has taken the alprazolam for years, less than what is prescribed, used primarily to go to sleep, and likes to have some extra in case she gets upset about something or can't get to sleep.  We discussed the risks of falls and cognitive decline.  Her daughter is mostly concerned about having medication available if her mother becomes upset or anxious [place] and it [place] some time to get to her.  "We need it to keep her calm sometimes."  We agreed to reduce the dispense to reflect what she is taking; rather then 0.5mg  "take 2-3 as needed", she will have prescription for 1 tab nightly and a few extra.  Goal is to gradually taper this off and replace with trazodone or other alternative.

## 2023-04-11 NOTE — Assessment & Plan Note (Signed)
Tends to get lost at night. Will order a BSC to limit the distance she must travel.

## 2023-04-11 NOTE — Assessment & Plan Note (Signed)
See Generalized Anxiety

## 2023-04-11 NOTE — Assessment & Plan Note (Addendum)
Hematology visit yesterday: repeat labs drawn and plans underway for repletion.  She reports ice pica. Not dizzy when standing.  No GI source of blood identied on upper/lower endoscopy; R sided self limited diverticular bleed possible.

## 2023-04-11 NOTE — Assessment & Plan Note (Signed)
BP low today at 105/51. Stop amlodipine. She continues on benazapril 40 mg daily.  Terazosin had been stopped some time ago (high association with falls).

## 2023-04-11 NOTE — Assessment & Plan Note (Signed)
Much improved , just in the past few weeks, w/o particular intervention.  Excoriated areas of her face are nearly healed.  She is feeling more like herself.

## 2023-04-11 NOTE — Assessment & Plan Note (Signed)
Chronic long term use.  High fall risk due to vision loss and coprescribed opioids and benzos.  These risks were discussed today.  Working on alprazolam first.

## 2023-04-13 ENCOUNTER — Inpatient Hospital Stay: Payer: Medicare Other

## 2023-04-13 VITALS — BP 132/92 | HR 60 | Temp 97.8°F | Resp 18

## 2023-04-13 DIAGNOSIS — E538 Deficiency of other specified B group vitamins: Secondary | ICD-10-CM | POA: Diagnosis not present

## 2023-04-13 DIAGNOSIS — D500 Iron deficiency anemia secondary to blood loss (chronic): Secondary | ICD-10-CM

## 2023-04-13 DIAGNOSIS — K5900 Constipation, unspecified: Secondary | ICD-10-CM | POA: Diagnosis not present

## 2023-04-13 DIAGNOSIS — Z79624 Long term (current) use of inhibitors of nucleotide synthesis: Secondary | ICD-10-CM | POA: Diagnosis not present

## 2023-04-13 DIAGNOSIS — Z79899 Other long term (current) drug therapy: Secondary | ICD-10-CM | POA: Diagnosis not present

## 2023-04-13 DIAGNOSIS — I10 Essential (primary) hypertension: Secondary | ICD-10-CM | POA: Diagnosis not present

## 2023-04-13 DIAGNOSIS — F17200 Nicotine dependence, unspecified, uncomplicated: Secondary | ICD-10-CM | POA: Diagnosis not present

## 2023-04-13 DIAGNOSIS — Z79891 Long term (current) use of opiate analgesic: Secondary | ICD-10-CM | POA: Diagnosis not present

## 2023-04-13 DIAGNOSIS — Z7902 Long term (current) use of antithrombotics/antiplatelets: Secondary | ICD-10-CM | POA: Diagnosis not present

## 2023-04-13 MED ORDER — FERUMOXYTOL INJECTION 510 MG/17 ML
510.0000 mg | Freq: Once | INTRAVENOUS | Status: AC
  Administered 2023-04-13: 510 mg via INTRAVENOUS
  Filled 2023-04-13: qty 510

## 2023-04-13 MED ORDER — CETIRIZINE HCL 10 MG PO TABS
10.0000 mg | ORAL_TABLET | Freq: Once | ORAL | Status: AC
  Administered 2023-04-13: 10 mg via ORAL
  Filled 2023-04-13: qty 1

## 2023-04-13 MED ORDER — SODIUM CHLORIDE 0.9 % IV SOLN: INTRAVENOUS | Status: DC

## 2023-04-13 NOTE — Patient Instructions (Signed)
[facility] [place] [facility]  Discharge Instructions: Thank you for choosing [facility] to provide your oncology and hematology care.  If you have a lab appointment with the Cancer Center - please note that after April 8th, 2024, all labs will be drawn in the cancer center.  You do not have to check in or register with the main entrance as you have in the past but will complete your check-in in the cancer center.  Wear comfortable clothing and clothing appropriate for easy access to any Portacath or PICC line.   We strive to give you quality time with your provider. You may need to reschedule your appointment if you arrive late (15 or more minutes).  Arriving late affects you and other patients whose appointments are after yours.  Also, if you miss three or more appointments without notifying the office, you may be dismissed from the clinic at the provider's discretion.      For prescription refill requests, have your pharmacy contact our office and allow 72 hours for refills to be completed.    Today you received the following feraheme iron infusion   To help prevent nausea and vomiting after your treatment, we encourage you to take your nausea medication as directed.  BELOW ARE SYMPTOMS THAT SHOULD BE REPORTED IMMEDIATELY: *FEVER GREATER THAN 100.4 F (38 C) OR HIGHER *CHILLS OR SWEATING *NAUSEA AND VOMITING THAT IS NOT CONTROLLED WITH YOUR NAUSEA MEDICATION *UNUSUAL SHORTNESS OF BREATH *UNUSUAL BRUISING OR BLEEDING *URINARY PROBLEMS (pain or burning when urinating, or frequent urination) *BOWEL PROBLEMS (unusual diarrhea, constipation, pain near the anus) TENDERNESS IN MOUTH AND THROAT WITH OR WITHOUT PRESENCE OF ULCERS (sore throat, sores in mouth, or a toothache) UNUSUAL RASH, SWELLING OR PAIN  UNUSUAL VAGINAL DISCHARGE OR ITCHING   Items with * indicate a potential emergency and should be followed up as soon as possible or go to  the Emergency Department if any problems should occur.  Please show the CHEMOTHERAPY ALERT CARD or IMMUNOTHERAPY ALERT CARD at check-in to the Emergency Department and triage nurse.  Should you have questions after your visit or need to cancel or reschedule your appointment, please contact [facility] [place] - A DEPT OF [facility] [phone]  and follow the prompts.  Office hours are 8:00 a.m. to 4:30 p.m. Monday - Friday. Please note that voicemails left after 4:00 p.m. may not be returned until the following business day.  We are closed weekends and major holidays. You have access to a nurse at all times for urgent questions. Please call the main number to the clinic [phone] and follow the prompts.  For any non-urgent questions, you may also contact your provider using MyChart. We now offer e-Visits for anyone 58 and older to request care online for non-urgent symptoms. For details visit [email or web address].   Also download the MyChart app! Go to the app store, search "MyChart", open the app, select [place], and log in with your MyChart username and password.

## 2023-04-13 NOTE — Progress Notes (Signed)
 Feraheme iron infusion given per orders. Patient tolerated it well without problems. Vitals stable and discharged home from clinic ambulatory. Follow up as scheduled.

## 2023-04-19 ENCOUNTER — Telehealth: Payer: Self-pay | Admitting: *Deleted

## 2023-04-19 DIAGNOSIS — F411 Generalized anxiety disorder: Secondary | ICD-10-CM

## 2023-04-19 DIAGNOSIS — H5410 Blindness, one eye, low vision other eye, unspecified eyes: Secondary | ICD-10-CM

## 2023-04-19 DIAGNOSIS — Z9181 History of falling: Secondary | ICD-10-CM

## 2023-04-19 MED ORDER — ALPRAZOLAM 0.5 MG PO TABS: ORAL_TABLET | ORAL | 1 refills | Status: DC

## 2023-04-19 NOTE — Telephone Encounter (Signed)
 Spoke with Jessica [name] about orders for the [employer] and the Bed side Commode.  Orders will need to be sent in separately as referrals.  Will forward to patient's PCP to complete.

## 2023-04-19 NOTE — Telephone Encounter (Signed)
 Call from patient's daughter states patient's current dosage of the Xanax  is not working.  Patient's wants to know if she can get her original dose restarted.  If so will need a prescription sent in.  Patient has not received the Shower chair or the bedside Commode that was ordered as of yet.

## 2023-04-20 ENCOUNTER — Inpatient Hospital Stay: Payer: Medicare Other | Attending: Hematology

## 2023-04-20 VITALS — BP 117/46 | HR 57 | Temp 97.3°F | Resp 17

## 2023-04-20 DIAGNOSIS — D509 Iron deficiency anemia, unspecified: Secondary | ICD-10-CM | POA: Diagnosis not present

## 2023-04-20 DIAGNOSIS — F17210 Nicotine dependence, cigarettes, uncomplicated: Secondary | ICD-10-CM | POA: Diagnosis not present

## 2023-04-20 DIAGNOSIS — E538 Deficiency of other specified B group vitamins: Secondary | ICD-10-CM | POA: Diagnosis not present

## 2023-04-20 DIAGNOSIS — Z79899 Other long term (current) drug therapy: Secondary | ICD-10-CM | POA: Insufficient documentation

## 2023-04-20 DIAGNOSIS — D500 Iron deficiency anemia secondary to blood loss (chronic): Secondary | ICD-10-CM

## 2023-04-20 MED ORDER — SODIUM CHLORIDE 0.9 % IV SOLN
INTRAVENOUS | Status: DC
Start: 2023-04-20 — End: 2023-04-20

## 2023-04-20 MED ORDER — CETIRIZINE HCL 10 MG PO TABS
10.0000 mg | ORAL_TABLET | Freq: Once | ORAL | Status: AC
  Administered 2023-04-20: 10 mg via ORAL
  Filled 2023-04-20: qty 1

## 2023-04-20 MED ORDER — FERUMOXYTOL INJECTION 510 MG/17 ML
510.0000 mg | Freq: Once | INTRAVENOUS | Status: AC
  Administered 2023-04-20: 510 mg via INTRAVENOUS
  Filled 2023-04-20: qty 510

## 2023-04-20 NOTE — Patient Instructions (Signed)
 [facility] [place] [facility]  Discharge Instructions: Thank you for choosing [facility] to provide your oncology and hematology care.  If you have a lab appointment with the Cancer Center - please note that after April 8th, 2024, all labs will be drawn in the cancer center.  You do not have to check in or register with the main entrance as you have in the past but will complete your check-in in the cancer center.  Wear comfortable clothing and clothing appropriate for easy access to any Portacath or PICC line.   We strive to give you quality time with your provider. You may need to reschedule your appointment if you arrive late (15 or more minutes).  Arriving late affects you and other patients whose appointments are after yours.  Also, if you miss three or more appointments without notifying the office, you may be dismissed from the clinic at the provider's discretion.      For prescription refill requests, have your pharmacy contact our office and allow 72 hours for refills to be completed.    Today you received the following:  Feraheme.  Ferumoxytol Injection What is this medication? FERUMOXYTOL (FER ue MOX i tol) treats low levels of iron in your body (iron deficiency anemia). Iron is a mineral that plays an important role in making red blood cells, which carry oxygen from your lungs to the rest of your body. This medicine may be used for other purposes; ask your health care provider or pharmacist if you have questions. COMMON BRAND NAME(S): Feraheme What should I tell my care team before I take this medication? They need to know if you have any of these conditions: Anemia not caused by low iron levels High levels of iron in the blood Magnetic resonance imaging (MRI) test scheduled An unusual or allergic reaction to iron, other medications, foods, dyes, or preservatives Pregnant or trying to get pregnant Breastfeeding How should I  use this medication? This medication is injected into a vein. It is given by your care team in a hospital or clinic setting. Talk to your care team the use of this medication in children. Special care may be needed. Overdosage: If you think you have taken too much of this medicine contact a poison control center or emergency room at once. NOTE: This medicine is only for you. Do not share this medicine with others. What if I miss a dose? It is important not to miss your dose. Call your care team if you are unable to keep an appointment. What may interact with this medication? Other iron products This list may not describe all possible interactions. Give your health care provider a list of all the medicines, herbs, non-prescription drugs, or dietary supplements you use. Also tell them if you smoke, drink alcohol, or use illegal drugs. Some items may interact with your medicine. What should I watch for while using this medication? Visit your care team for regular checks on your progress. Tell your care team if your symptoms do not start to get better or if they get worse. You may need blood work done while you are taking this medication. You may need to eat more foods that contain iron. Talk to your care team. Foods that contain iron include whole grains or cereals, dried fruits, beans, peas, leafy green vegetables, and organ meats (liver, kidney). What side effects may I notice from receiving this medication? Side effects that you should  report to your care team as soon as possible: Allergic reactions--skin rash, itching, hives, swelling of the face, lips, tongue, or throat Low blood pressure--dizziness, feeling faint or lightheaded, blurry vision Shortness of breath Side effects that usually do not require medical attention (report to your care team if they continue or are bothersome): Flushing Headache Joint pain Muscle pain Nausea Pain, redness, or irritation at injection site This list  may not describe all possible side effects. Call your doctor for medical advice about side effects. You may report side effects to FDA at 1-800-FDA-1088. Where should I keep my medication? This medication is given in a hospital or clinic. It will not be stored at home. NOTE: This sheet is a summary. It may not cover all possible information. If you have questions about this medicine, talk to your doctor, pharmacist, or health care provider.  2024 Elsevier/Gold Standard (2022-10-18 00:00:00)    To help prevent nausea and vomiting after your treatment, we encourage you to take your nausea medication as directed.  BELOW ARE SYMPTOMS THAT SHOULD BE REPORTED IMMEDIATELY: *FEVER GREATER THAN 100.4 F (38 C) OR HIGHER *CHILLS OR SWEATING *NAUSEA AND VOMITING THAT IS NOT CONTROLLED WITH YOUR NAUSEA MEDICATION *UNUSUAL SHORTNESS OF BREATH *UNUSUAL BRUISING OR BLEEDING *URINARY PROBLEMS (pain or burning when urinating, or frequent urination) *BOWEL PROBLEMS (unusual diarrhea, constipation, pain near the anus) TENDERNESS IN MOUTH AND THROAT WITH OR WITHOUT PRESENCE OF ULCERS (sore throat, sores in mouth, or a toothache) UNUSUAL RASH, SWELLING OR PAIN  UNUSUAL VAGINAL DISCHARGE OR ITCHING   Items with * indicate a potential emergency and should be followed up as soon as possible or go to the Emergency Department if any problems should occur.  Please show the CHEMOTHERAPY ALERT CARD or IMMUNOTHERAPY ALERT CARD at check-in to the Emergency Department and triage nurse.  Should you have questions after your visit or need to cancel or reschedule your appointment, please contact [facility] [place] - A DEPT OF [facility] [phone]  and follow the prompts.  Office hours are 8:00 a.m. to 4:30 p.m. Monday - Friday. Please note that voicemails left after 4:00 p.m. may not be returned until the following business day.  We are closed weekends and major holidays. You have access to a  nurse at all times for urgent questions. Please call the main number to the clinic [phone] and follow the prompts.  For any non-urgent questions, you may also contact your provider using MyChart. We now offer e-Visits for anyone 39 and older to request care online for non-urgent symptoms. For details visit [email or web address].   Also download the MyChart app! Go to the app store, search "MyChart", open the app, select [place], and log in with your MyChart username and password.

## 2023-04-20 NOTE — Telephone Encounter (Signed)
 Call to patient spoke with her daughter [name] informed her that Dr. Trudy has increased her Xanax  to 2 tablets at night.  To only take the extra tablet if needed.  Prescription has been sent to the Pharmacy.  The Shower Chair and the Bed side Commode have been reordered for patient.  Daughter voiced understanding of the plan.

## 2023-04-20 NOTE — Progress Notes (Signed)
 Patient presents today for iron  infusion.  Patient is in satisfactory condition with no new complaints voiced.  Vital signs are stable.  IV placed in R arm.   IV flushed well with good blood return noted.  We will proceed with infusion per provider orders.    Patient tolerated infusion well with no complaints voiced.  Patient left via wheelchair with daughter in stable condition.  Vital signs stable at discharge.  Follow up as scheduled.

## 2023-05-01 ENCOUNTER — Other Ambulatory Visit: Payer: Self-pay

## 2023-05-02 MED ORDER — OXYCODONE-ACETAMINOPHEN 5-325 MG PO TABS: 1.0000 | ORAL_TABLET | Freq: Three times a day (TID) | ORAL | 0 refills | Status: DC | PRN

## 2023-05-14 ENCOUNTER — Other Ambulatory Visit: Payer: Self-pay

## 2023-05-14 DIAGNOSIS — D500 Iron deficiency anemia secondary to blood loss (chronic): Secondary | ICD-10-CM

## 2023-05-14 DIAGNOSIS — E538 Deficiency of other specified B group vitamins: Secondary | ICD-10-CM

## 2023-05-15 ENCOUNTER — Inpatient Hospital Stay: Payer: Medicare Other | Attending: Oncology

## 2023-05-15 DIAGNOSIS — Z79899 Other long term (current) drug therapy: Secondary | ICD-10-CM | POA: Diagnosis not present

## 2023-05-15 DIAGNOSIS — E538 Deficiency of other specified B group vitamins: Secondary | ICD-10-CM | POA: Insufficient documentation

## 2023-05-15 DIAGNOSIS — D500 Iron deficiency anemia secondary to blood loss (chronic): Secondary | ICD-10-CM

## 2023-05-15 DIAGNOSIS — Z7902 Long term (current) use of antithrombotics/antiplatelets: Secondary | ICD-10-CM | POA: Diagnosis not present

## 2023-05-15 DIAGNOSIS — D509 Iron deficiency anemia, unspecified: Secondary | ICD-10-CM | POA: Diagnosis not present

## 2023-05-15 LAB — COMPREHENSIVE METABOLIC PANEL
ALT: 11 U/L (ref 0–44)
AST: 17 U/L (ref 15–41)
Albumin: 3.7 g/dL (ref 3.5–5.0)
Alkaline Phosphatase: 46 U/L (ref 38–126)
Anion gap: 8 (ref 5–15)
BUN: 13 mg/dL (ref 8–23)
CO2: 23 mmol/L (ref 22–32)
Calcium: 9.5 mg/dL (ref 8.9–10.3)
Chloride: 107 mmol/L (ref 98–111)
Creatinine, Ser: 0.64 mg/dL (ref 0.44–1.00)
GFR, Estimated: >60 mL/min (ref >60)
Glucose, Bld: 91 mg/dL (ref 70–99)
Potassium: 3.3 mmol/L — ABNORMAL LOW (ref 3.5–5.1)
Sodium: 138 mmol/L (ref 135–145)
Total Bilirubin: 0.3 mg/dL (ref 0.0–1.2)
Total Protein: 7 g/dL (ref 6.5–8.1)

## 2023-05-15 LAB — CBC WITH DIFFERENTIAL/PLATELET
Abs Immature Granulocytes: 0 10*3/uL (ref 0.00–0.07)
Basophils Absolute: 0 10*3/uL (ref 0.0–0.1)
Basophils Relative: 1 %
Eosinophils Absolute: 0.3 10*3/uL (ref 0.0–0.5)
Eosinophils Relative: 7 %
HCT: 35.7 % — ABNORMAL LOW (ref 36.0–46.0)
Hemoglobin: 11.2 g/dL — ABNORMAL LOW (ref 12.0–15.0)
Immature Granulocytes: 0 %
Lymphocytes Relative: 55 %
Lymphs Abs: 2.8 10*3/uL (ref 0.7–4.0)
MCH: 31.2 pg (ref 26.0–34.0)
MCHC: 31.4 g/dL (ref 30.0–36.0)
MCV: 99.4 fL (ref 80.0–100.0)
Monocytes Absolute: 0.4 10*3/uL (ref 0.1–1.0)
Monocytes Relative: 7 %
Neutro Abs: 1.5 10*3/uL — ABNORMAL LOW (ref 1.7–7.7)
Neutrophils Relative %: 30 %
Platelets: 289 10*3/uL (ref 150–400)
RBC: 3.59 MIL/uL — ABNORMAL LOW (ref 3.87–5.11)
RDW: 16.6 % — ABNORMAL HIGH (ref 11.5–15.5)
WBC: 5 10*3/uL (ref 4.0–10.5)
nRBC: 0 % (ref 0.0–0.2)

## 2023-05-15 LAB — FERRITIN: Ferritin: 195 ng/mL (ref 11–307)

## 2023-05-15 LAB — IRON AND TIBC
Iron: 144 ug/dL (ref 28–170)
Saturation Ratios: 39 % — ABNORMAL HIGH (ref 10.4–31.8)
TIBC: 373 ug/dL (ref 250–450)
UIBC: 229 ug/dL

## 2023-05-15 LAB — LACTATE DEHYDROGENASE: LDH: 135 U/L (ref 98–192)

## 2023-05-15 LAB — FOLATE: Folate: 17.7 ng/mL (ref >5.9)

## 2023-05-15 LAB — VITAMIN B12: Vitamin B-12: 175 pg/mL — ABNORMAL LOW (ref 180–914)

## 2023-05-16 ENCOUNTER — Inpatient Hospital Stay

## 2023-05-16 ENCOUNTER — Inpatient Hospital Stay (HOSPITAL_BASED_OUTPATIENT_CLINIC_OR_DEPARTMENT_OTHER): Payer: Medicare Other | Admitting: Oncology

## 2023-05-16 VITALS — BP 119/61 | HR 65 | Temp 97.9°F | Resp 17 | Wt 113.4 lb

## 2023-05-16 DIAGNOSIS — Z79899 Other long term (current) drug therapy: Secondary | ICD-10-CM | POA: Diagnosis not present

## 2023-05-16 DIAGNOSIS — Z7902 Long term (current) use of antithrombotics/antiplatelets: Secondary | ICD-10-CM | POA: Diagnosis not present

## 2023-05-16 DIAGNOSIS — D500 Iron deficiency anemia secondary to blood loss (chronic): Secondary | ICD-10-CM

## 2023-05-16 DIAGNOSIS — E538 Deficiency of other specified B group vitamins: Secondary | ICD-10-CM

## 2023-05-16 DIAGNOSIS — D649 Anemia, unspecified: Secondary | ICD-10-CM | POA: Diagnosis not present

## 2023-05-16 DIAGNOSIS — D509 Iron deficiency anemia, unspecified: Secondary | ICD-10-CM | POA: Diagnosis not present

## 2023-05-16 LAB — HAPTOGLOBIN: Haptoglobin: 84 mg/dL (ref 42–346)

## 2023-05-16 MED ORDER — CYANOCOBALAMIN 1000 MCG/ML IJ SOLN
1000.0000 ug | Freq: Once | INTRAMUSCULAR | Status: AC
  Administered 2023-05-16: 1000 ug via INTRAMUSCULAR
  Filled 2023-05-16: qty 1

## 2023-05-16 NOTE — Progress Notes (Signed)
B12 injection given per orders. Patient tolerated it well without problems. Vitals stable and discharged home from clinic via wheelchair. Follow up as scheduled.  

## 2023-05-16 NOTE — Patient Instructions (Signed)
 VISIT SUMMARY:  During today's visit, we discussed your improved condition with iron deficiency anemia and addressed your ongoing vitamin B12 deficiency. Your hemoglobin levels have increased significantly, but your vitamin B12 levels remain low despite oral supplementation.  YOUR PLAN:  -VITAMIN B12 DEFICIENCY: Vitamin B12 deficiency is a condition where your body lacks enough vitamin B12, which is essential for red blood cell production and nerve function. To address this, you will receive vitamin B12 injections weekly for four weeks, then monthly for four months. You should also continue taking your oral vitamin B12 supplements at home. Please schedule your injections at the infusion center.  -IRON DEFICIENCY ANEMIA: Iron deficiency anemia is a condition where your body lacks enough iron to produce healthy red blood cells. Your condition has improved significantly with IV iron infusions, and your hemoglobin levels have increased from 6 g/dL to 11.9 g/dL. Continue your oral every other day  INSTRUCTIONS:  Please schedule a follow-up appointment in three months to reassess your condition. At that time, we will perform laboratory tests to check your hemoglobin and vitamin B12 levels.  For more information, you can read your full clinical note, available in your patient portal.

## 2023-05-16 NOTE — Assessment & Plan Note (Signed)
 Vitamin B12 low today. -Will start parenteral vitamin B12 supplementation -Continue oral vitamin B12 supplementation   Return to clinic in 3 months with labs to assess response

## 2023-05-16 NOTE — Assessment & Plan Note (Signed)
 Hemoglobin previously at 5.8 with unclear etiology. No active bleeding or dark stools reported.  Endoscopy and colonoscopy did not show active signs of bleeding iron levels are very low, indicating possible iron deficiency anemia.  Iron deficiency improved significantly with IV iron. -Continue oral iron supplementation every other day. -Continue to follow with GI  Return to clinic in 3 months with labs

## 2023-05-16 NOTE — Progress Notes (Signed)
 [facility] at [facility] HEMATOLOGY FOLLOW-UP VISIT  Miguel Aschoff, MD  REASON FOR FOLLOW-UP: Iron deficiency anemia  ASSESSMENT & PLAN:  Patient is an 83 year old female referred for iron deficiency anemia   Iron deficiency anemia due to chronic blood loss Hemoglobin previously at 5.8 with unclear etiology. No active bleeding or dark stools reported.  Endoscopy and colonoscopy did not show active signs of bleeding iron levels are very low, indicating possible iron deficiency anemia.  Iron deficiency improved significantly with IV iron. -Continue oral iron supplementation every other day. -Continue to follow with GI  Return to clinic in 3 months with labs  Vitamin B12 deficiency Vitamin B12 low today. -Will start parenteral vitamin B12 supplementation -Continue oral vitamin B12 supplementation   Return to clinic in 3 months with labs to assess response   Orders Placed This Encounter  Procedures   Ferritin    Standing Status:   Future    Expected Date:   08/13/2023    Expiration Date:   05/15/2024   Folate    Standing Status:   Future    Expected Date:   08/13/2023    Expiration Date:   05/15/2024   Vitamin B12    Standing Status:   Future    Expected Date:   08/13/2023    Expiration Date:   05/15/2024   CBC with Differential/Platelet    Standing Status:   Future    Expected Date:   08/13/2023    Expiration Date:   05/15/2024   Comprehensive metabolic panel    Standing Status:   Future    Expected Date:   08/13/2023    Expiration Date:   05/15/2024   Iron and TIBC    Standing Status:   Future    Expected Date:   08/13/2023    Expiration Date:   05/15/2024    The total time spent in the appointment was 20 minutes encounter with patients including review of chart and various tests results, discussions about plan of care and coordination of care plan   All questions were answered. The patient knows to call the clinic with any problems, questions or  concerns. No barriers to learning was detected.  Cindie Crumbly, MD 3/5/20252:47 PM   SUMMARY OF HEMATOLOGIC HISTORY: Patient with significant iron deficiency -12/31/2022: Endoscopy and colonoscopy: No signs of active bleeding seen -S/p IV Feraheme into 2 doses on 04/13/2023 and 04/20/2023 -Low vitamin B12 levels.  Will start parenteral vitamin B12 supplementation.   INTERVAL HISTORY: Jessica [name] 83 y.o. female following for iron deficiency anemia.  Patient is accompanied by her daughter today.  Patient reports improved appetite and less fatigue since starting iron supplements.  She has no other complaints today.  We discussed that her iron labs had significant improvement but her vitamin B12 levels are still low and will start parenteral vitamin B12 supplementation.  Patient and daughter agreed to the plan.   I have reviewed the past medical history, past surgical history, social history and family history with the patient   ALLERGIES:  is allergic to hctz [hydrochlorothiazide].  MEDICATIONS:  Current Outpatient Medications  Medication Sig Dispense Refill   acyclovir (ZOVIRAX) 400 MG tablet TAKE 1 TABLET BY MOUTH THREE TIMES DAILY AS NEEDED FOR OUTBREAKS FOR 7 DAYS 21 tablet 5   alendronate (FOSAMAX) 70 MG tablet Take 1 tablet (70 mg total) by mouth every Sunday. TAKE 1 TABLET BY MOUTH ONCE WEEKLY ON SUNDAY take with a full  glass of water on an empty stomach Strength: 70 mg 12 tablet 3   ALPRAZolam (XANAX) 0.5 MG tablet Take no more than 2 at night if needed for sleep.  Try one tab first and use the second only if absolutely needed. 60 tablet 1   aspirin EC 81 MG tablet Take 1 tablet (81 mg total) by mouth daily with breakfast. 30 tablet 5   atorvastatin (LIPITOR) 10 MG tablet Take 1 tablet (10 mg total) by mouth at bedtime. 90 tablet 3   benazepril (LOTENSIN) 40 MG tablet Take 1 tablet (40 mg total) by mouth daily. 90 tablet 3   bictegravir-emtricitabine-tenofovir AF  (BIKTARVY) 50-200-25 MG TABS tablet Take 1 tablet by mouth daily. 30 tablet 11   brimonidine (ALPHAGAN) 0.2 % ophthalmic solution 1 drop 3 (three) times daily.     clopidogrel (PLAVIX) 75 MG tablet Take 1 tablet (75 mg total) by mouth daily. 90 tablet 3   cyanocobalamin (VITAMIN B12) 1000 MCG tablet Take 1 tablet (1,000 mcg total) by mouth daily. 30 tablet 2   diclofenac Sodium (VOLTAREN) 1 % GEL Apply 2 g topically 4 (four) times daily. APPLY TWO GRAMS TO AFFECTED AREA(S) FOUR TIMES DAILY Strength: 1 % 350 g 2   DIPHENHYDRAMINE HCL, TOPICAL, (BENADRYL ITCH STOPPING) 2 % GEL Apply 1 Application topically every 4 (four) hours as needed (symptoms of bugs on scalp). 57 g 3   dorzolamide-timolol (COSOPT) 2-0.5 % ophthalmic solution Place 1 drop into the right eye 2 (two) times daily.     ferrous sulfate 325 (65 FE) MG tablet Take 1 tablet (325 mg total) by mouth daily with breakfast.     gabapentin (NEURONTIN) 300 MG capsule Take 1 capsule (300 mg total) by mouth 3 (three) times daily. 270 capsule 3   oxyCODONE-acetaminophen (PERCOCET/ROXICET) 5-325 MG tablet Take 1 tablet by mouth every 8 (eight) hours as needed for severe pain (pain score 7-10). 60 tablet 0   predniSONE (DELTASONE) 10 MG tablet Take as directed per MD instructions 21 tablet 0   thiamine (VITAMIN B1) 100 MG tablet Take 1 tablet (100 mg total) by mouth daily. 90 tablet 3   No current facility-administered medications for this visit.     REVIEW OF SYSTEMS:   Constitutional: Denies fevers, chills or night sweats Eyes: Denies blurriness of vision Ears, nose, mouth, throat, and face: Denies mucositis or sore throat Respiratory: Denies cough, dyspnea or wheezes Cardiovascular: Denies palpitation, chest discomfort or lower extremity swelling Gastrointestinal:  Denies nausea, heartburn or change in bowel habits Skin: Denies abnormal skin rashes Lymphatics: Denies new lymphadenopathy or easy bruising Neurological:Denies numbness,  tingling or new weaknesses Behavioral/Psych: Mood is stable, no new changes  All other systems were reviewed with the patient and are negative.  PHYSICAL EXAMINATION:   Vitals:   05/16/23 0957  BP: 119/61  Pulse: 65  Resp: 17  Temp: 97.9 F (36.6 C)  SpO2: 98%    GENERAL:alert, no distress and comfortable LUNGS: clear to auscultation and percussion with normal breathing effort HEART: regular rate & rhythm and no murmurs and no lower extremity edema ABDOMEN:abdomen soft, non-tender and normal bowel sounds Musculoskeletal:no cyanosis of digits and no clubbing  NEURO: alert & oriented x 3 with fluent speech  LABORATORY DATA:  I have reviewed the data as listed  Lab Results  Component Value Date   WBC 5.0 05/15/2023   NEUTROABS 1.5 (L) 05/15/2023   HGB 11.2 (L) 05/15/2023   HCT 35.7 (L) 05/15/2023   MCV  99.4 05/15/2023   PLT 289 05/15/2023      Chemistry      Component Value Date/Time   NA 138 05/15/2023 1038   NA 142 12/30/2021 0946   K 3.3 (L) 05/15/2023 1038   CL 107 05/15/2023 1038   CO2 23 05/15/2023 1038   BUN 13 05/15/2023 1038   BUN 14 12/30/2021 0946   CREATININE 0.64 05/15/2023 1038   CREATININE 0.73 12/04/2022 1345      Component Value Date/Time   CALCIUM 9.5 05/15/2023 1038   ALKPHOS 46 05/15/2023 1038   AST 17 05/15/2023 1038   ALT 11 05/15/2023 1038   BILITOT 0.3 05/15/2023 1038   BILITOT 0.3 03/16/2017 1006      Latest Reference Range & Units 05/15/23 10:38  Iron 28 - 170 ug/dL 130  UIBC ug/dL 865  TIBC 784 - 696 ug/dL 295  Saturation Ratios 10.4 - 31.8 % 39 (H)  Ferritin 11 - 307 ng/mL 195  Folate >5.9 ng/mL 17.7  Vitamin B12 180 - 914 pg/mL 175 (L)  (H): Data is abnormally high (L): Data is abnormally low

## 2023-05-23 ENCOUNTER — Inpatient Hospital Stay

## 2023-05-24 ENCOUNTER — Inpatient Hospital Stay

## 2023-05-24 VITALS — BP 103/65 | HR 66 | Temp 97.8°F | Resp 16

## 2023-05-24 DIAGNOSIS — Z7902 Long term (current) use of antithrombotics/antiplatelets: Secondary | ICD-10-CM | POA: Diagnosis not present

## 2023-05-24 DIAGNOSIS — E538 Deficiency of other specified B group vitamins: Secondary | ICD-10-CM

## 2023-05-24 DIAGNOSIS — D509 Iron deficiency anemia, unspecified: Secondary | ICD-10-CM | POA: Diagnosis not present

## 2023-05-24 DIAGNOSIS — Z79899 Other long term (current) drug therapy: Secondary | ICD-10-CM | POA: Diagnosis not present

## 2023-05-24 MED ORDER — CYANOCOBALAMIN 1000 MCG/ML IJ SOLN
1000.0000 ug | Freq: Once | INTRAMUSCULAR | Status: DC
Start: 2023-05-24 — End: 2023-05-24

## 2023-05-24 MED ORDER — CYANOCOBALAMIN 1000 MCG/ML IJ SOLN
1000.0000 ug | Freq: Once | INTRAMUSCULAR | Status: AC
  Administered 2023-05-24: 1000 ug via INTRAMUSCULAR
  Filled 2023-05-24: qty 1

## 2023-05-24 NOTE — Patient Instructions (Signed)

## 2023-05-24 NOTE — Progress Notes (Signed)
 Patient tolerated injection with no complaints voiced.  Site clean and dry with no bruising or swelling noted at site.  See MAR for details.  Band aid applied.  Patient stable during and after injection.  Vss with discharge and left in satisfactory condition with no s/s of distress noted.

## 2023-05-28 ENCOUNTER — Other Ambulatory Visit: Payer: Self-pay

## 2023-05-30 ENCOUNTER — Ambulatory Visit: Payer: Self-pay | Admitting: Internal Medicine

## 2023-05-30 ENCOUNTER — Inpatient Hospital Stay

## 2023-05-30 ENCOUNTER — Ambulatory Visit: Admitting: Internal Medicine

## 2023-05-30 VITALS — BP 118/53 | HR 51 | Temp 97.9°F | Resp 16

## 2023-05-30 VITALS — BP 123/68 | HR 56 | Temp 98.5°F | Wt 110.9 lb

## 2023-05-30 DIAGNOSIS — E538 Deficiency of other specified B group vitamins: Secondary | ICD-10-CM | POA: Diagnosis not present

## 2023-05-30 DIAGNOSIS — F22 Delusional disorders: Secondary | ICD-10-CM

## 2023-05-30 DIAGNOSIS — F411 Generalized anxiety disorder: Secondary | ICD-10-CM

## 2023-05-30 DIAGNOSIS — F1120 Opioid dependence, uncomplicated: Secondary | ICD-10-CM

## 2023-05-30 DIAGNOSIS — Z7902 Long term (current) use of antithrombotics/antiplatelets: Secondary | ICD-10-CM | POA: Diagnosis not present

## 2023-05-30 DIAGNOSIS — M1612 Unilateral primary osteoarthritis, left hip: Secondary | ICD-10-CM | POA: Diagnosis not present

## 2023-05-30 DIAGNOSIS — D509 Iron deficiency anemia, unspecified: Secondary | ICD-10-CM | POA: Diagnosis not present

## 2023-05-30 DIAGNOSIS — K5903 Drug induced constipation: Secondary | ICD-10-CM | POA: Diagnosis not present

## 2023-05-30 DIAGNOSIS — Z79899 Other long term (current) drug therapy: Secondary | ICD-10-CM | POA: Diagnosis not present

## 2023-05-30 MED ORDER — ALPRAZOLAM 0.5 MG PO TABS: ORAL_TABLET | ORAL | 0 refills | Status: DC

## 2023-05-30 MED ORDER — POLYETHYLENE GLYCOL 3350 17 G PO PACK
17.0000 g | PACK | Freq: Every day | ORAL | 2 refills | Status: AC
Start: 2023-05-30 — End: ?

## 2023-05-30 MED ORDER — CYANOCOBALAMIN 1000 MCG/ML IJ SOLN
1000.0000 ug | Freq: Once | INTRAMUSCULAR | Status: AC
Start: 2023-05-30 — End: 2023-05-30
  Administered 2023-05-30: 1000 ug via INTRAMUSCULAR
  Filled 2023-05-30: qty 1

## 2023-05-30 MED ORDER — OXYCODONE-ACETAMINOPHEN 5-325 MG PO TABS: 1.0000 | ORAL_TABLET | Freq: Three times a day (TID) | ORAL | 0 refills | Status: DC | PRN

## 2023-05-30 MED ORDER — SENNA 8.6 MG PO TABS: 1.0000 | ORAL_TABLET | Freq: Every day | ORAL | 0 refills | Status: AC

## 2023-05-30 NOTE — Progress Notes (Signed)
 Received notification from front desk that patient is here for visit to evaluate for hip pain. Asked front desk to relay to her that her appointment is with internal medicine across the street.   Sandie Ano, RN

## 2023-05-30 NOTE — Telephone Encounter (Signed)
 Please address.

## 2023-05-30 NOTE — Progress Notes (Signed)
 Patient tolerated B12 injection with no complaints voiced.  Site clean and dry with no bruising or swelling noted at site.  See MAR for details.  Band aid applied.  Patient stable during and after injection.  Vss with discharge and left in satisfactory condition with no s/s of distress noted. All follow ups as scheduled.   Jessica [name] [employer]

## 2023-05-30 NOTE — Telephone Encounter (Signed)
  Chief Complaint: hip pain  Symptoms: pain  Disposition: [] ED /[] Urgent Care (no appt availability in office) / [x] Appointment(In office/virtual)/ []  [place] [facility]/ [] Home Care/ [] Refused Recommended Disposition /[] [place] [facility]/ []  Follow-up with PCP Additional Notes: Pt daughter,[name], called about hip pain and being out of pain medication. Med refill was called in on Monday. RN reminded pt that PCP has 72 hours to fill request. [name] stated "she can't go that long." Pt has sharp pain while walking and limps at times. Pt has appt at 1345. RN gave care advice and [name] verbalized understanding.              Copied from CRM [id number]. Topic: Clinical - Red Word Triage >> May 30, 2023  9:10 AM Philippa Chester F wrote: [facility] that prompted transfer to Nurse Triage:   Extreme pain; Medication refill has not been taken care of for oxyCODONE-Acetaminophen Reason for Disposition  [1] SEVERE pain (e.g., excruciating, unable to do any normal activities) AND [2] not improved after 2 hours of pain medicine  Answer Assessment - Initial Assessment Questions 1. LOCATION and RADIATION: "Where is the pain located?"      Hip pain  2. QUALITY: "What does the pain feel like?"  (e.g., sharp, dull, aching, burning)     Sharp  3. SEVERITY: "How bad is the pain?" "What does it keep you from doing?"   (Scale 1-10; or mild, moderate, severe)   -  MILD (1-3): doesn't interfere with normal activities    -  MODERATE (4-7): interferes with normal activities (e.g., work or school) or awakens from sleep, limping    -  SEVERE (8-10): excruciating pain, unable to do any normal activities, unable to walk     Moderate  4. ONSET: "When did the pain start?" "Does it come and go, or is it there all the time?"     Not sure   6. CAUSE: "What do you think is causing the hip pain?"      Aging  7. AGGRAVATING FACTORS: "What makes the hip pain worse?" (e.g., walking, climbing stairs, running)     Walking   8. OTHER SYMPTOMS: "Do you have any other symptoms?" (e.g., back pain, pain shooting down leg,  fever, rash)     Denies all  Protocols used: Hip Pain-A-AH

## 2023-05-30 NOTE — Progress Notes (Unsigned)
 Subjective:  CC: pain concerns  HPI:  Jessica [name] is a 83 y.o. female with a past medical history of lumbar radiculopathy, HIV, glaucoma, bilateral SNHL, IDA who presents today due to difficulty filling pain medications. On PDMP she last refilled percocet on 2/19 with 60 tablets for 20 days. She takes medication 1-2 times daily depending on level of pain in her left hip and back. She has followed with sports medicine in the past with recommendations to have steroid injection in hip if pain worsened.  Please see problem based assessment and plan for additional details.  Past Medical History:  Diagnosis Date   Acute blood loss anemia 08/08/2020   Acute renal failure (ARF) (HCC) 08/17/2017   Anxiety 04/05/2012   Bilateral sensorineural hearing loss 06/11/2014   Mild to moderate on the left side and slight to mild on the right side per audiometry 05/2014.  Hearing aides with possible masking of tinnitus recommended but patient wished to defer secondary to finances.   Blood transfusion without reported diagnosis    pt denies   Bursitis of right shoulder 07/12/2012   s/p shoulder injection 07/12/2012    Cataract of right eye    REMOVED RIGHT EYE 4-19    Constipation due to pain medication 04/27/2010   Diverticulosis 02/08/2012   Extensive left-sided diverticula on colonoscopy March 2012 per Dr. Jena Gauss    Diverticulosis of colon without hemorrhage 02/08/2012   Extensive left-sided diverticula on colonoscopy March 2012 per Dr. Jena Gauss     Essential hypertension 07/20/2006   Genital herpes 07/20/2006   Glaucoma of left eye 07/20/2006   Headache 10/20/2019   Heart murmur 1961   History of vitamin D deficiency 05/29/2018   Vitamin D 18.96 (04/30/2018), treated with ergocalciferol 50,000 units PO QWk X 4 weeks     Human immunodeficiency virus disease (HCC) 03/27/1986   Hyperlipidemia LDL goal < 100 04/05/2012   Hypokalemia 04/12/2018   Insomnia 10/20/2019   Iron deficiency  anemia due to chronic blood loss 04/04/2023   Left hip pain 04/11/2021   Leg pain 04/11/2021   Long-term current use of opiate analgesic 03/17/2016   Loose stools 04/08/2019   Lumbar degenerative disc disease 07/20/2006   With chronic back pain    Marijuana use 07/03/2016   Micturition syncope 09/20/2015   Nausea and vomiting 04/08/2019   Opiate dependence (HCC) 04/12/2018   Opioid dependence with current use (HCC) 04/12/2018   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   s/p aortobifem bypass 2009    Periumbilical hernia 05/18/2014   1 cm left periumbilical abdominal wall defect   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   DEXA 04/15/2012: L1-L4 spine T -3.9, Right femur T -3.0    Right rotator cuff tear 02/01/2013   Responds to periodic steroid injections   Seborrhea 09/01/2010   Shoulder pain, right 12/04/2017   Small bowel obstruction due to adhesions (HCC) 02/08/2012   s/p Exploratory laparotomy, lysis of adhesions 02/12/12     Subjective tinnitus of both ears 05/18/2014   Tobacco abuse 02/19/2012   Tobacco abuse    Vasovagal syncope 02/15/2015   Visual hallucination 12/04/2022   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   Vitamin D deficiency 05/29/2018   Vitamin D 18.96 (04/30/2018), treated with ergocalciferol 50,000 units PO QWk X 4 weeks   Voiding dysfunction    s/p cystoscopy and meatal dilation Dec 2005    MEDICATIONS:  Acyclovir 400 mg 3 times daily as needed for breakouts  Xanax 0.5 mg nightly as needed Ultrasound abdominal exclusion Permanente Tdap Tomeko solution Biktarvy Percocet 5-3 25 every 8 hours as needed Atorvastatin 10 mg nightly Gabapentin 300 mg 3 times daily Benazepril 40 mg daily Alendronate 70 mg daily Clopidogrel 75 mg daily Aspirin 81 mg  Family History  Problem Relation Age of Onset   Kidney failure Mother    Diabetes Mother    Hypertension Mother    Heart disease Mother    Glaucoma Father     Congestive Heart Failure Sister    Diabetes Sister    Kidney disease Sister    Diabetes Brother    Unexplained death Brother 27       Automobile accident   Hypothyroidism Daughter    Arthritis Daughter        Neck/Back   Healthy Son    HIV/AIDS Brother    HIV Daughter    Kidney disease Daughter    Arthritis Son        Knee   Colon cancer Neg Hx    Colon polyps Neg Hx    Esophageal cancer Neg Hx    Rectal cancer Neg Hx    Stomach cancer Neg Hx     Past Surgical History:  Procedure Laterality Date   ABDOMINAL HYSTERECTOMY     AORTO-FEMORAL BYPASS GRAFT  04/2007   APPENDECTOMY     BIOPSY  12/28/2022   Procedure: BIOPSY;  Surgeon: Franky Macho, MD;  [facility]: [facility];  Service: Endoscopy;;   BREAST SURGERY     Breast biopsy: negative   CHOLECYSTECTOMY     COLECTOMY  01/2011   Dr. Jamey Ripa; "took out 12 inches of small intestiines and removed blockage"   COLONOSCOPY  2012   COLONOSCOPY WITH PROPOFOL N/A 12/29/2022   Procedure: COLONOSCOPY WITH PROPOFOL;  Surgeon: Dolores Frame, MD;  Location: [facility];  Service: Gastroenterology;  Laterality: N/A;   ESOPHAGOGASTRODUODENOSCOPY (EGD) WITH PROPOFOL N/A 12/28/2022   Procedure: ESOPHAGOGASTRODUODENOSCOPY (EGD) WITH PROPOFOL;  Surgeon: Franky Macho, MD;  [facility]: [facility];  Service: Endoscopy;  Laterality: N/A;   EYE SURGERY     EYE SURGERY  06/29/2020   06-29-2020- RIGHT CATARACT REMOVED AND LEFT EYE SURGERY TO REMOVE SAND LIKE SUBSTANCE    FEMORAL ARTERY EXPLORATION Left 07/10/2020   Procedure: REDO LEFT FEMORAL ARTERY EXPOSURE;  Surgeon: Nada Libman, MD;  Location: [facility];  Service: Vascular;  Laterality: Left;   GIVENS CAPSULE STUDY N/A 12/30/2022   Procedure: GIVENS CAPSULE STUDY;  Surgeon: Dolores Frame, MD;  Location: [facility];  Service: Gastroenterology;  Laterality: N/A;   LAPAROTOMY  02/12/2012   Procedure: EXPLORATORY LAPAROTOMY;  Surgeon: Almond Lint, MD;   Location: [facility] OR;  Service: General;  Laterality: N/A;  Exploratory Laparotomy, lysis of adhesions   SMALL INTESTINE SURGERY     THROMBECTOMY FEMORAL ARTERY Left 07/10/2020   Procedure: THROMBECTOMY AORTA-BIFEMORAL GRAFT, PROFUNDA, AND SUPERFICIAL FEMORAL ARTERY  LEFT LEG;  Surgeon: Nada Libman, MD;  Location: [facility];  Service: Vascular;  Laterality: Left;     Social History   Socioeconomic History   Marital status: Widowed    Spouse name: Not on file   Number of children: 4   Years of education: 2y college   Highest education level: Not on file  Occupational History   Occupation: retired    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   Tobacco Use   Smoking status: Every Day  Current packs/day: 0.30    Average packs/day: 0.3 packs/day for 50.0 years (15.0 ttl pk-yrs)    Types: Cigarettes    Passive exposure: Current   Smokeless tobacco: Never   Tobacco comments:    5 cigs per day  Vaping Use   Vaping status: Never Used  Substance and Sexual Activity   Alcohol use: No    Alcohol/week: 0.0 standard drinks of alcohol    Comment: "last drink of alcohol ~ 1977"   Drug use: No   Sexual activity: Never  Other Topics Concern   Not on file  Social History Narrative   Lives alone in [place], [place]   Social Drivers of Health   Financial Resource Strain: Low Risk  (08/16/2022)   Overall Financial Resource Strain (CARDIA)    Difficulty of Paying Living Expenses: Not hard at all  Food Insecurity: No Food Insecurity (12/28/2022)   Hunger Vital Sign    Worried About Running Out of Food in the Last Year: Never true    Ran Out of Food in the Last Year: Never true  Transportation Needs: No Transportation Needs (12/28/2022)   PRAPARE - Administrator, Civil Service (Medical): No    Lack of Transportation (Non-Medical): No  Physical Activity: Insufficiently Active (08/16/2022)   Exercise Vital Sign    Days of Exercise per Week: 1 day    Minutes of Exercise per  Session: 10 min  Stress: Stress Concern Present (08/16/2022)   [employer] of Occupational Health - Occupational Stress Questionnaire    Feeling of Stress : To some extent  Social Connections: Moderately Isolated (08/16/2022)   Social Connection and Isolation Panel [NHANES]    Frequency of Communication with Friends and Family: More than three times a week    Frequency of Social Gatherings with Friends and Family: More than three times a week    Attends Religious Services: More than 4 times per year    Active Member of [employer] or Organizations: No    Attends Banker Meetings: Never    Marital Status: Widowed  Intimate Partner Violence: Not At Risk (12/28/2022)   Humiliation, Afraid, Rape, and Kick questionnaire    Fear of Current or Ex-Partner: No    Emotionally Abused: No    Physically Abused: No    Sexually Abused: No    Review of Systems: ROS negative except for what is noted on the assessment and plan.  Objective:   Vitals:   05/30/23 1359  BP: 123/68  Pulse: (!) 56  Temp: 98.5 F (36.9 C)  TempSrc: Oral  Weight: 110 lb 14.4 oz (50.3 kg)    Physical Exam: Constitutional: thin, sitting in transport chair with daughter at side HENT: Hyperpigmented nodule to center of anterior part of hairline, no excoriations noted Cardiovascular: regular rate and rhythm, no m/r/g Pulmonary/Chest: normal work of breathing on room air, lungs clear to auscultation bilaterally Abdominal: soft, non-tender, distended MSK: normal bulk and tone Neurological: alert & oriented x 3 Skin: warm and dry  Assessment & Plan:  Opioid dependence with current use [facility]) Patient was scheduled by virtual nurse service line because her pain medications had not been filled yet.  Prescription was requested on the 17th.  She takes medications for sharp pain in her left hip from osteoarthritis.  PDMP reviewed and she last picked up pain medications on 2/19.  Jessica [name] is having constipation.  She says  she goes about every week.  I talked with her that the pain medication  can make her constipation worse.  She does not take anything to help with her constipation.  I encouraged her to start MiraLAX and senna daily to improve symptoms. P: Refill Percocet 5-325 mg, 60 tabs.  Daughter requested prescription be printed so they can take 2 pharmacy themselves.  Ekbom's delusional parasitosis (HCC) She is very concerned about area on front of forehead near hairline.  She feels like area is raised and there is something in it.  She has been putting alcohol there to try to clean.  At times she pulls scab off. P: Encouraged to stop using alcohol and try over the counter steroid cream   Generalized anxiety disorder Patient presents with concerns about not being able to sleep after her sister passed away in May 30, 2023. She was prescribed 60 tablets of xanax 0.5 mg and PDMP shows refill on 2/27. Jessica [name] feels that the bottle from the pharmacy did not contain 60 tablets when she filled it but she did not count. She has been taking 2 tablets of 0.5 mg nightly. P: Xanax 0.5 mg, 10 tablets filled We discussed risks of falls with xanax and pain medications. I encouraged her to continue conversations with primary care doctor about continuing high-risk medications.   Patient discussed with Dr. Laney Pastor Hillary Struss, D.O. [facility]  [id number] Pager: [phone]  Phone: [phone] Date 05/31/2023  Time 8:02 AM

## 2023-05-30 NOTE — Patient Instructions (Signed)
 Thank you, Ms.Suezanne Cheshire for allowing [name] to provide your care today.   I have provided a paper prescription for pain medications and anxiety medications. These medications are high risk and put you at increased risk of falls. Please continue to have conversations with Dr. Mayford Knife about this.  Please start taking miralax and senna to help with constipation.  Consider going back to sports med. I wonder if getting a steroid injection in your hip would help with pain.  I have ordered the following medication/changed the following medications:   Stop the following medications: Medications Discontinued During This Encounter  Medication Reason   predniSONE (DELTASONE) 10 MG tablet    oxyCODONE-acetaminophen (PERCOCET/ROXICET) 5-325 MG tablet Reorder   ALPRAZolam (XANAX) 0.5 MG tablet Reorder     Start the following medications: Meds ordered this encounter  Medications   oxyCODONE-acetaminophen (PERCOCET/ROXICET) 5-325 MG tablet    Sig: Take 1 tablet by mouth every 8 (eight) hours as needed for severe pain (pain score 7-10).    Dispense:  60 tablet    Refill:  0   ALPRAZolam (XANAX) 0.5 MG tablet    Sig: Take no more than 2 at night if needed for sleep.  Try one tab first and use the second only if absolutely needed.    Dispense:  10 tablet    Refill:  0    No early refills   polyethylene glycol (MIRALAX) 17 g packet    Sig: Take 17 g by mouth daily.    Dispense:  90 packet    Refill:  2   senna (SENOKOT) 8.6 MG TABS tablet    Sig: Take 1 tablet (8.6 mg total) by mouth daily.    Dispense:  120 tablet    Refill:  0     We look forward to seeing you next time. Please call our clinic at [phone] if you have any questions or concerns. The best time to call is Monday-Friday from 9am-4pm, but there is someone available 24/7. If after hours or the weekend, call the main hospital number and ask for the Internal Medicine Resident On-Call. If you need medication refills,  please notify your pharmacy one week in advance and they will send [name] a request.   Thank you for trusting me with your care. Wishing you the best!   Rudene Christians, DO [facility]

## 2023-05-31 NOTE — Assessment & Plan Note (Signed)
 She is very concerned about area on front of forehead near hairline.  She feels like area is raised and there is something in it.  She has been putting alcohol there to try to clean.  At times she pulls scab off. P: Encouraged to stop using alcohol and try over the counter steroid cream

## 2023-05-31 NOTE — Assessment & Plan Note (Signed)
 Patient presents with concerns about not being able to sleep after her sister passed away in 2023-05-08. She was prescribed 60 tablets of xanax 0.5 mg and PDMP shows refill on 2/27. Jessica [name] feels that the bottle from the pharmacy did not contain 60 tablets when she filled it but she did not count. She has been taking 2 tablets of 0.5 mg nightly. P: Xanax 0.5 mg, 10 tablets filled We discussed risks of falls with xanax and pain medications. I encouraged her to continue conversations with primary care doctor about continuing high-risk medications.

## 2023-05-31 NOTE — Assessment & Plan Note (Signed)
 Patient was scheduled by virtual nurse service line because her pain medications had not been filled yet.  Prescription was requested on the 17th.  She takes medications for sharp pain in her left hip from osteoarthritis.  PDMP reviewed and she last picked up pain medications on 2/19.  Jessica [name] is having constipation.  She says she goes about every week.  I talked with her that the pain medication can make her constipation worse.  She does not take anything to help with her constipation.  I encouraged her to start MiraLAX and senna daily to improve symptoms. P: Refill Percocet 5-325 mg, 60 tabs.  Daughter requested prescription be printed so they can take 2 pharmacy themselves.

## 2023-06-05 ENCOUNTER — Other Ambulatory Visit: Payer: Self-pay

## 2023-06-05 ENCOUNTER — Ambulatory Visit (INDEPENDENT_AMBULATORY_CARE_PROVIDER_SITE_OTHER): Payer: Medicare Other | Admitting: Infectious Disease

## 2023-06-05 ENCOUNTER — Encounter: Payer: Self-pay | Admitting: Infectious Disease

## 2023-06-05 ENCOUNTER — Other Ambulatory Visit (HOSPITAL_COMMUNITY)
Admission: RE | Admit: 2023-06-05 | Discharge: 2023-06-05 | Disposition: A | Discharge status: Home / Self Care | Source: Ambulatory Visit | Attending: Infectious Disease | Admitting: Infectious Disease

## 2023-06-05 VITALS — BP 97/68 | HR 74 | Resp 16 | Ht 66.75 in | Wt 108.6 lb

## 2023-06-05 DIAGNOSIS — B20 Human immunodeficiency virus [HIV] disease: Secondary | ICD-10-CM

## 2023-06-05 DIAGNOSIS — F411 Generalized anxiety disorder: Secondary | ICD-10-CM | POA: Diagnosis not present

## 2023-06-05 DIAGNOSIS — D500 Iron deficiency anemia secondary to blood loss (chronic): Secondary | ICD-10-CM

## 2023-06-05 DIAGNOSIS — E785 Hyperlipidemia, unspecified: Secondary | ICD-10-CM

## 2023-06-05 DIAGNOSIS — L989 Disorder of the skin and subcutaneous tissue, unspecified: Secondary | ICD-10-CM

## 2023-06-05 DIAGNOSIS — F1120 Opioid dependence, uncomplicated: Secondary | ICD-10-CM

## 2023-06-05 DIAGNOSIS — F22 Delusional disorders: Secondary | ICD-10-CM

## 2023-06-05 DIAGNOSIS — I10 Essential (primary) hypertension: Secondary | ICD-10-CM

## 2023-06-05 DIAGNOSIS — M5136 Other intervertebral disc degeneration, lumbar region with discogenic back pain only: Secondary | ICD-10-CM

## 2023-06-05 DIAGNOSIS — K922 Gastrointestinal hemorrhage, unspecified: Secondary | ICD-10-CM | POA: Diagnosis not present

## 2023-06-05 DIAGNOSIS — E538 Deficiency of other specified B group vitamins: Secondary | ICD-10-CM | POA: Diagnosis not present

## 2023-06-05 MED ORDER — BIKTARVY 50-200-25 MG PO TABS: 1.0000 | ORAL_TABLET | Freq: Every day | ORAL | 11 refills | Status: DC

## 2023-06-05 MED ORDER — VALACYCLOVIR HCL 1 G PO TABS: 1000.0000 mg | ORAL_TABLET | Freq: Two times a day (BID) | ORAL | 0 refills | Status: DC

## 2023-06-05 NOTE — Progress Notes (Signed)
 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   MRN: [id number]  HPI  Discussed the use of AI scribe software for clinical note transcription with the patient, who gave verbal consent to proceed.  History of Present Illness   The patient, with a history of HIV, presents with persistent scalp lesions. She was hospitalized three times in October for an unidentified internal bleed. She underwent a colonoscopy and MRI, but the source of the bleed was not identified. She was treated with a proton pump inhibitor and iron supplements for suspected upper GI bleed. She is also receiving B12 injections.  The scalp lesions have been present for several months and have not resolved. She describes the lesions as healing and then reappearing. She has seen a dermatologist who she and her daughter did not find helpful. The patient is concerned the lesions may be due to an infection or parasite as described in my notes and that of internal medicine. I did offer to treat for possible HSV infection here.      Past Medical History:  Diagnosis Date   Acute blood loss anemia 08/08/2020   Acute renal failure (ARF) (HCC) 08/17/2017   Anxiety 04/05/2012   Bilateral sensorineural hearing loss 06/11/2014   Mild to moderate on the left side and slight to mild on the right side per audiometry 05/2014.  Hearing aides with possible masking of tinnitus recommended but patient wished to defer secondary to finances.   Blood transfusion without reported diagnosis    pt denies   Bursitis of right shoulder 07/12/2012   s/p shoulder injection 07/12/2012    Cataract of right eye    REMOVED RIGHT EYE 4-19    Constipation due to pain medication 04/27/2010   Diverticulosis 02/08/2012   Extensive left-sided diverticula on colonoscopy March 2012 per Dr. Jena Gauss    Diverticulosis of colon without hemorrhage 02/08/2012   Extensive  left-sided diverticula on colonoscopy March 2012 per Dr. Jena Gauss     Essential hypertension 07/20/2006   Genital herpes 07/20/2006   Glaucoma of left eye 07/20/2006   Headache 10/20/2019   Heart murmur 1961   History of vitamin D deficiency 05/29/2018   Vitamin D 18.96 (04/30/2018), treated with ergocalciferol 50,000 units PO QWk X 4 weeks     Human immunodeficiency virus disease (HCC) 03/27/1986   Hyperlipidemia LDL goal < 100 04/05/2012   Hypokalemia 04/12/2018   Insomnia 10/20/2019   Iron deficiency anemia due to chronic blood loss 04/04/2023   Left hip pain 04/11/2021   Leg pain 04/11/2021   Long-term current use of opiate analgesic 03/17/2016   Loose stools 04/08/2019   Lumbar degenerative disc disease 07/20/2006   With chronic back pain    Marijuana use 07/03/2016   Micturition syncope 09/20/2015   Nausea and vomiting 04/08/2019   Opiate dependence (HCC) 04/12/2018   Opioid dependence with current use (HCC) 04/12/2018   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   s/p aortobifem bypass 2009    Periumbilical hernia 05/18/2014   1 cm left periumbilical abdominal wall defect   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   DEXA 04/15/2012: L1-L4 spine T -3.9, Right femur T -3.0    Right rotator cuff tear 02/01/2013   Responds to periodic steroid injections   Seborrhea 09/01/2010   Shoulder pain, right 12/04/2017   Small bowel obstruction due to adhesions (HCC) 02/08/2012   s/p Exploratory laparotomy, lysis of  adhesions 02/12/12     Subjective tinnitus of both ears 05/18/2014   Tobacco abuse 02/19/2012   Tobacco abuse    Vasovagal syncope 02/15/2015   Visual hallucination 12/04/2022   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 Jamey Ripa; "took out 12 inches of small intestiines and removed blockage"   COLONOSCOPY  2012   COLONOSCOPY WITH PROPOFOL N/A 12/29/2022   Procedure: COLONOSCOPY WITH PROPOFOL;  Surgeon: Dolores Frame, MD;  Location: [facility];  Service: Gastroenterology;  Laterality: N/A;   ESOPHAGOGASTRODUODENOSCOPY (EGD) WITH PROPOFOL N/A 12/28/2022   Procedure: ESOPHAGOGASTRODUODENOSCOPY (EGD) WITH PROPOFOL;  Surgeon: Franky Macho, MD;  Location: [facility];  Service: Endoscopy;  Laterality: N/A;   EYE SURGERY     EYE SURGERY  06/29/2020   06-29-2020- RIGHT CATARACT REMOVED AND LEFT EYE SURGERY TO REMOVE SAND LIKE SUBSTANCE    FEMORAL ARTERY EXPLORATION Left 07/10/2020   Procedure: REDO LEFT FEMORAL ARTERY EXPOSURE;  Surgeon: Nada Libman, MD;  Location: [facility];  Service: Vascular;  Laterality: Left;   GIVENS CAPSULE STUDY N/A 12/30/2022   Procedure: GIVENS CAPSULE STUDY;  Surgeon: Dolores Frame, MD;  Location: [facility];  Service: Gastroenterology;  Laterality: N/A;   LAPAROTOMY  02/12/2012   Procedure: EXPLORATORY LAPAROTOMY;  Surgeon: Almond Lint, MD;  Location: [facility];  Service: General;  Laterality: N/A;  Exploratory Laparotomy, lysis of adhesions   SMALL INTESTINE SURGERY     THROMBECTOMY FEMORAL ARTERY Left 07/10/2020   Procedure: THROMBECTOMY AORTA-BIFEMORAL GRAFT, PROFUNDA, AND SUPERFICIAL FEMORAL ARTERY  LEFT LEG;  Surgeon: Nada Libman, MD;  Location: [facility];  Service: Vascular;  Laterality: Left;    Family History  Problem Relation Age of Onset   Kidney failure Mother    Diabetes Mother    Hypertension Mother    Heart  disease Mother    Glaucoma Father    Congestive Heart Failure Sister    Diabetes Sister    Kidney disease Sister    Diabetes Brother    Unexplained death Brother 42       Automobile accident   Hypothyroidism Daughter    Arthritis Daughter        Neck/Back   Healthy Son    HIV/AIDS Brother    HIV Daughter    Kidney disease Daughter    Arthritis Son        Knee   Colon cancer Neg Hx    Colon polyps Neg Hx    Esophageal cancer Neg Hx    Rectal cancer Neg Hx    Stomach cancer Neg Hx       Social History   Socioeconomic History   Marital status: Widowed    Spouse name: Not on file   Number of children: 4   Years of education: 2y college   Highest education level:  Not on file  Occupational History   Occupation: retired    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   Tobacco Use   Smoking status: Every Day    Current packs/day: 0.30    Average packs/day: 0.3 packs/day for 50.0 years (15.0 ttl pk-yrs)    Types: Cigarettes    Passive exposure: Current   Smokeless tobacco: Never   Tobacco comments:    5 cigs per day  Vaping Use   Vaping status: Never Used  Substance and Sexual Activity   Alcohol use: No    Alcohol/week: 0.0 standard drinks of alcohol    Comment: "last drink of alcohol ~ 1977"   Drug use: No   Sexual activity: Never  Other Topics Concern   Not on file  Social History Narrative   Lives alone in [place], [place]   Social Drivers of Health   Financial Resource Strain: Low Risk  (08/16/2022)   Overall Financial Resource Strain (CARDIA)    Difficulty of Paying Living Expenses: Not hard at all  Food Insecurity: No Food Insecurity (12/28/2022)   Hunger Vital Sign    Worried About Running Out of Food in the Last Year: Never true    Ran Out of Food in the Last Year: Never true  Transportation Needs: No Transportation Needs (12/28/2022)   PRAPARE - Administrator, Civil Service (Medical): No    Lack of Transportation (Non-Medical): No   Physical Activity: Insufficiently Active (08/16/2022)   Exercise Vital Sign    Days of Exercise per Week: 1 day    Minutes of Exercise per Session: 10 min  Stress: Stress Concern Present (08/16/2022)   [employer] of Occupational Health - Occupational Stress Questionnaire    Feeling of Stress : To some extent  Social Connections: Moderately Isolated (08/16/2022)   Social Connection and Isolation Panel [NHANES]    Frequency of Communication with Friends and Family: More than three times a week    Frequency of Social Gatherings with Friends and Family: More than three times a week    Attends Religious Services: More than 4 times per year    Active Member of [employer] or Organizations: No    Attends Banker Meetings: Never    Marital Status: Widowed    Allergies  Allergen Reactions   Hctz [Hydrochlorothiazide] Other (See Comments)    Dizziness, syncope; does NOT wish to take anymore     Current Outpatient Medications:    alendronate (FOSAMAX) 70 MG tablet, Take 1 tablet (70 mg total) by mouth every Sunday. TAKE 1 TABLET BY MOUTH ONCE WEEKLY ON SUNDAY take with a full glass of water on an empty stomach Strength: 70 mg, Disp: 12 tablet, Rfl: 3   ALPRAZolam (XANAX) 0.5 MG tablet, Take no more than 2 at night if needed for sleep.  Try one tab first and use the second only if absolutely needed., Disp: 10 tablet, Rfl: 0   aspirin EC 81 MG tablet, Take 1 tablet (81 mg total) by mouth daily with breakfast., Disp: 30 tablet, Rfl: 5   atorvastatin (LIPITOR) 10 MG tablet, Take 1 tablet (10 mg total) by mouth at bedtime., Disp: 90 tablet, Rfl: 3   benazepril (LOTENSIN) 40 MG tablet, Take 1 tablet (40 mg total) by mouth daily., Disp: 90 tablet, Rfl: 3   brimonidine (ALPHAGAN) 0.2 % ophthalmic solution, 1 drop 3 (three) times daily., Disp: , Rfl:    clopidogrel (PLAVIX) 75 MG tablet, Take 1 tablet (75  mg total) by mouth daily., Disp: 90 tablet, Rfl: 3   cyanocobalamin (VITAMIN B12) 1000 MCG  tablet, Take 1 tablet (1,000 mcg total) by mouth daily., Disp: 30 tablet, Rfl: 2   diclofenac Sodium (VOLTAREN) 1 % GEL, Apply 2 g topically 4 (four) times daily. APPLY TWO GRAMS TO AFFECTED AREA(S) FOUR TIMES DAILY Strength: 1 %, Disp: 350 g, Rfl: 2   DIPHENHYDRAMINE HCL, TOPICAL, (BENADRYL ITCH STOPPING) 2 % GEL, Apply 1 Application topically every 4 (four) hours as needed (symptoms of bugs on scalp)., Disp: 57 g, Rfl: 3   dorzolamide-timolol (COSOPT) 2-0.5 % ophthalmic solution, Place 1 drop into the right eye 2 (two) times daily., Disp: , Rfl:    ferrous sulfate 325 (65 FE) MG tablet, Take 1 tablet (325 mg total) by mouth daily with breakfast., Disp: , Rfl:    gabapentin (NEURONTIN) 300 MG capsule, Take 1 capsule (300 mg total) by mouth 3 (three) times daily., Disp: 270 capsule, Rfl: 3   oxyCODONE-acetaminophen (PERCOCET/ROXICET) 5-325 MG tablet, Take 1 tablet by mouth every 8 (eight) hours as needed for severe pain (pain score 7-10)., Disp: 60 tablet, Rfl: 0   polyethylene glycol (MIRALAX) 17 g packet, Take 17 g by mouth daily., Disp: 90 packet, Rfl: 2   senna (SENOKOT) 8.6 MG TABS tablet, Take 1 tablet (8.6 mg total) by mouth daily., Disp: 120 tablet, Rfl: 0   thiamine (VITAMIN B1) 100 MG tablet, Take 1 tablet (100 mg total) by mouth daily., Disp: 90 tablet, Rfl: 3   valACYclovir (VALTREX) 1000 MG tablet, Take 1 tablet (1,000 mg total) by mouth 2 (two) times daily., Disp: 28 tablet, Rfl: 0   bictegravir-emtricitabine-tenofovir AF (BIKTARVY) 50-200-25 MG TABS tablet, Take 1 tablet by mouth daily., Disp: 30 tablet, Rfl: 11  Review of Systems  Constitutional:  Negative for chills and fever.  HENT:  Negative for congestion and sore throat.   Eyes:  Negative for photophobia.  Respiratory:  Negative for cough, shortness of breath and wheezing.   Cardiovascular:  Negative for chest pain, palpitations and leg swelling.  Gastrointestinal:  Negative for abdominal pain, blood in stool, constipation,  diarrhea, nausea and vomiting.  Genitourinary:  Negative for dysuria, flank pain and hematuria.  Musculoskeletal:  Negative for back pain and myalgias.  Skin:  Positive for rash.  Neurological:  Negative for dizziness, weakness and headaches.  Hematological:  Does not bruise/bleed easily.  Psychiatric/Behavioral:  Positive for dysphoric mood. Negative for suicidal ideas. The patient is nervous/anxious.        Objective:   Physical Exam Constitutional:      General: She is not in acute distress.    Appearance: She is not diaphoretic.  HENT:     Head: Normocephalic and atraumatic.     Right Ear: External ear normal.     Left Ear: External ear normal.     Nose: Nose normal.     Mouth/Throat:     Pharynx: No oropharyngeal exudate.  Eyes:     General: No scleral icterus.       Right eye: No discharge.        Left eye: No discharge.     Extraocular Movements: Extraocular movements intact.     Conjunctiva/sclera: Conjunctivae normal.  Cardiovascular:     Rate and Rhythm: Normal rate and regular rhythm.  Pulmonary:     Effort: Pulmonary effort is normal. No respiratory distress.     Breath sounds: No wheezing.  Abdominal:     General: There is  no distension.     Palpations: Abdomen is soft.  Musculoskeletal:        General: No tenderness. Normal range of motion.     Cervical back: Normal range of motion and neck supple.  Lymphadenopathy:     Cervical: No cervical adenopathy.  Skin:    General: Skin is warm and dry.     Coloration: Skin is not jaundiced or pale.     Findings: No erythema, lesion or rash.  Neurological:     General: No focal deficit present.     Mental Status: She is alert and oriented to person, place, and time.  Psychiatric:        Mood and Affect: Mood is anxious.        Speech: Speech normal.        Behavior: Behavior normal.        Thought Content: Thought content is delusional.        Judgment: Judgment normal.    Lesions pictured  today            Assessment & Plan:   Assessment and Plan    HIV infection On Biktarvy. Advised separation of Biktarvy and iron intake to prevent interaction affecting drug absorption. - Instructed to take Biktarvy in the morning and iron supplements at night.  Upper gastrointestinal bleed Experienced upper GI bleed with black stools. Source unidentified. Receiving vitamin B12 and iron for anemia. Recommended PPI for acid-related causes. - Continue iron supplementation. - Start proton pump inhibitor like omeprazole or Protonix.   B12 deficiency : continuing B12 injections  Scalp and facial rash Persistent rash on scalp and face. 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  - Monitor for improvement with Valtrex.     Anemia: Continue Lipitor  Chronic opioid dependence continue to follow with internal medicine

## 2023-06-05 NOTE — Telephone Encounter (Signed)
 Called and spoke with Tache and her daughter [name] since patient has not accessed MyChart since 2014. Discussed that mental status changes could be due to a multitude of causes and that provider would not want to test her urine since she's not having any urinary symptoms.   She does not have a neurologist. She is seeing her PCP on 4/3. Encouraged Jerlisa and her daughter to bring these concerns up to Dr. Mayford Knife as she may want to place a referral to neuro. They verbalized understanding and were appreciative of the call.   Sandie Ano, RN

## 2023-06-06 ENCOUNTER — Inpatient Hospital Stay

## 2023-06-06 ENCOUNTER — Encounter (HOSPITAL_COMMUNITY): Payer: Self-pay

## 2023-06-06 ENCOUNTER — Other Ambulatory Visit: Payer: Self-pay

## 2023-06-06 ENCOUNTER — Emergency Department (HOSPITAL_COMMUNITY)
Admission: EM | Admit: 2023-06-06 | Discharge: 2023-06-06 | Disposition: A | Discharge status: Home / Self Care | Attending: Emergency Medicine | Admitting: Emergency Medicine

## 2023-06-06 VITALS — BP 128/61 | HR 65 | Temp 96.4°F | Resp 18

## 2023-06-06 DIAGNOSIS — Z7982 Long term (current) use of aspirin: Secondary | ICD-10-CM | POA: Insufficient documentation

## 2023-06-06 DIAGNOSIS — R4182 Altered mental status, unspecified: Secondary | ICD-10-CM | POA: Diagnosis present

## 2023-06-06 DIAGNOSIS — Z21 Asymptomatic human immunodeficiency virus [HIV] infection status: Secondary | ICD-10-CM | POA: Insufficient documentation

## 2023-06-06 DIAGNOSIS — R441 Visual hallucinations: Secondary | ICD-10-CM | POA: Insufficient documentation

## 2023-06-06 DIAGNOSIS — N390 Urinary tract infection, site not specified: Secondary | ICD-10-CM | POA: Diagnosis not present

## 2023-06-06 DIAGNOSIS — R410 Disorientation, unspecified: Secondary | ICD-10-CM | POA: Insufficient documentation

## 2023-06-06 DIAGNOSIS — E538 Deficiency of other specified B group vitamins: Secondary | ICD-10-CM

## 2023-06-06 LAB — URINE CYTOLOGY ANCILLARY ONLY
Chlamydia: NEGATIVE
Comment: NEGATIVE
Comment: NORMAL
Neisseria Gonorrhea: NEGATIVE

## 2023-06-06 LAB — URINALYSIS, W/ REFLEX TO CULTURE (INFECTION SUSPECTED)
Bilirubin Urine: NEGATIVE
Glucose, UA: NEGATIVE mg/dL
Ketones, ur: 20 mg/dL — AB
Nitrite: NEGATIVE
Protein, ur: 30 mg/dL — AB
Specific Gravity, Urine: 1.018 (ref 1.005–1.030)
pH: 5 (ref 5.0–8.0)

## 2023-06-06 MED ORDER — ALPRAZOLAM 0.5 MG PO TABS
1.0000 mg | ORAL_TABLET | Freq: Once | ORAL | Status: AC
  Administered 2023-06-06: 1 mg via ORAL
  Filled 2023-06-06: qty 2

## 2023-06-06 MED ORDER — CEPHALEXIN 500 MG PO CAPS: 500.0000 mg | ORAL_CAPSULE | Freq: Two times a day (BID) | ORAL | 0 refills | Status: DC

## 2023-06-06 MED ORDER — CEPHALEXIN 500 MG PO CAPS
500.0000 mg | ORAL_CAPSULE | Freq: Once | ORAL | Status: AC
  Administered 2023-06-06: 500 mg via ORAL
  Filled 2023-06-06: qty 1

## 2023-06-06 MED ORDER — CYANOCOBALAMIN 1000 MCG/ML IJ SOLN
1000.0000 ug | Freq: Once | INTRAMUSCULAR | Status: AC
  Administered 2023-06-06: 1000 ug via INTRAMUSCULAR
  Filled 2023-06-06: qty 1

## 2023-06-06 NOTE — ED Provider Notes (Signed)
 Jessica [name] EMERGENCY DEPARTMENT AT [facility] Provider Note   CSN: [id number] Arrival date & time: 06/06/23  1130     History  Chief Complaint  Patient presents with   Altered Mental Status    Jessica [name] is a 83 y.o. female.  HPI     83 year old female comes in with chief complaint of altered mental status.  Patient accompanied by daughter.  Patient has history of HIV, PAD/PVD, glaucoma and blindness.  According to the patient's daughter, patient lost her sister with whom she has been living last month.  Prior to that, patient was seen by her PCP and her Xanax was decreased to q. nightly instead of 3 times daily.  The change was made because patient's vision was getting worse, and patient rarely needed to take Xanax 3 times daily anyways.  With the recent loss of her sister, she started requiring more Xanax and ran out of it a week ago.  PCP did call an emergency prescription, but it cannot be picked up until tomorrow.  In the interim, patient has not been sleeping well.  She has been hallucinating, talking about meeting people that do not exist.  Per daughter, patient has had UTI in the past with similar symptoms.  Currently patient has lower abdominal pain, but is not necessarily new.  She has chronic back pain as well.  She denies any burning with urination.  Home Medications Prior to Admission medications   Medication Sig Start Date End Date Taking? Authorizing Provider  cephALEXin (KEFLEX) 500 MG capsule Take 1 capsule (500 mg total) by mouth 2 (two) times daily. 06/06/23  Yes Derwood Kaplan, MD  alendronate (FOSAMAX) 70 MG tablet Take 1 tablet (70 mg total) by mouth every Sunday. TAKE 1 TABLET BY MOUTH ONCE WEEKLY ON SUNDAY take with a full glass of water on an empty stomach Strength: 70 mg 07/16/22   Shon Hale, MD  ALPRAZolam Prudy Feeler) 0.5 MG tablet Take no more than 2 at night if needed for sleep.  Try one tab first and use the second only if  absolutely needed. 05/30/23   Masters, Florentina Addison, DO  aspirin EC 81 MG tablet Take 1 tablet (81 mg total) by mouth daily with breakfast. 07/13/22   Mariea Clonts, Courage, MD  atorvastatin (LIPITOR) 10 MG tablet Take 1 tablet (10 mg total) by mouth at bedtime. 07/13/22   Shon Hale, MD  benazepril (LOTENSIN) 40 MG tablet Take 1 tablet (40 mg total) by mouth daily. 07/13/22   Shon Hale, MD  bictegravir-emtricitabine-tenofovir AF (BIKTARVY) 50-200-25 MG TABS tablet Take 1 tablet by mouth daily. 06/05/23   Randall Hiss, MD  brimonidine [facility]) 0.2 % ophthalmic solution 1 drop 3 (three) times daily. 04/28/23   [provider]  clopidogrel (PLAVIX) 75 MG tablet Take 1 tablet (75 mg total) by mouth daily. 08/10/22   Shon Hale, MD  cyanocobalamin (VITAMIN B12) 1000 MCG tablet Take 1 tablet (1,000 mcg total) by mouth daily. 04/04/23   Cindie Crumbly, MD  diclofenac Sodium (VOLTAREN) 1 % GEL Apply 2 g topically 4 (four) times daily. APPLY TWO GRAMS TO AFFECTED AREA(S) FOUR TIMES DAILY Strength: 1 % 10/31/22   Inez Catalina, MD  DIPHENHYDRAMINE HCL, TOPICAL, (BENADRYL ITCH STOPPING) 2 % GEL Apply 1 Application topically every 4 (four) hours as needed (symptoms of bugs on scalp). 01/19/23   Inez Catalina, MD  dorzolamide-timolol (COSOPT) 2-0.5 % ophthalmic solution Place 1 drop into the right eye 2 (two)  times daily. 07/18/22   [provider]  ferrous sulfate 325 (65 FE) MG tablet Take 1 tablet (325 mg total) by mouth daily with breakfast. 01/01/23   Tat, Onalee Hua, MD  gabapentin (NEURONTIN) 300 MG capsule Take 1 capsule (300 mg total) by mouth 3 (three) times daily. 10/06/22 10/06/23  Inez Catalina, MD  oxyCODONE-acetaminophen (PERCOCET/ROXICET) 5-325 MG tablet Take 1 tablet by mouth every 8 (eight) hours as needed for severe pain (pain score 7-10). 05/30/23   Masters, Katie, DO  polyethylene glycol (MIRALAX) 17 g packet Take 17 g by mouth daily. 05/30/23   Masters, Florentina Addison, DO  senna  (SENOKOT) 8.6 MG TABS tablet Take 1 tablet (8.6 mg total) by mouth daily. 05/30/23   Masters, Katie, DO  thiamine (VITAMIN B1) 100 MG tablet Take 1 tablet (100 mg total) by mouth daily. 01/15/23   Inez Catalina, MD  valACYclovir (VALTREX) 1000 MG tablet Take 1 tablet (1,000 mg total) by mouth 2 (two) times daily. 06/05/23   Randall Hiss, MD      Allergies    Hctz [hydrochlorothiazide]    Review of Systems   Review of Systems  All other systems reviewed and are negative.   Physical Exam Updated Vital Signs BP 132/68 (BP Location: Right Arm)   Pulse 65   Temp (!) 97.5 F (36.4 C) (Temporal)   Resp 18   Ht 5' 6.75" (1.695 m)   Wt 49.2 kg   SpO2 97%   BMI 17.12 kg/m  Physical Exam Vitals and nursing note reviewed.  Constitutional:      Appearance: She is well-developed.  HENT:     Head: Atraumatic.  Cardiovascular:     Rate and Rhythm: Normal rate.  Pulmonary:     Effort: Pulmonary effort is normal.  Musculoskeletal:     Cervical back: Normal range of motion and neck supple.  Skin:    General: Skin is warm and dry.  Neurological:     Mental Status: She is alert and oriented to person, place, and time.     Cranial Nerves: No cranial nerve deficit.     Sensory: No sensory deficit.     Motor: No weakness.     Comments: GCS is 15  Psychiatric:        Attention and Perception: Attention normal.        Mood and Affect: Mood normal. Affect is tearful.        Speech: Speech normal.        Behavior: Behavior normal.        Thought Content: Thought content normal.     ED Results / Procedures / Treatments   Labs (all labs ordered are listed, but only abnormal results are displayed) Labs Reviewed  URINALYSIS, W/ REFLEX TO CULTURE (INFECTION SUSPECTED) - Abnormal; Notable for the following components:      Result Value   Hgb urine dipstick SMALL (*)    Ketones, ur 20 (*)    Protein, ur 30 (*)    Leukocytes,Ua LARGE (*)    Bacteria, UA RARE (*)    All other  components within normal limits  URINE CULTURE    EKG None  Radiology No results found.  Procedures Procedures    Medications Ordered in ED Medications  ALPRAZolam Prudy Feeler) tablet 1 mg (1 mg Oral Given 06/06/23 1401)  cephALEXin (KEFLEX) capsule 500 mg (500 mg Oral Given 06/06/23 1408)    ED Course/ Medical Decision Making/ A&P  Medical Decision Making Amount and/or Complexity of Data Reviewed Labs: ordered.  Risk Prescription drug management.   83 year old patient with pertinent past medical history of HIV, blindness, chronic Xanax use comes in with chief complaint of altered mental status.  Essentially patient has been having some hallucinations, confusion, agitation.  Symptoms relatively new, but patient has had some confounders including a sister with whom she lived passing away a month ago, running out of Xanax.  Daughter also states that patient has had UTI with similar symptoms.  Collateral history provided by by patient's daughter.  I have reviewed patient's previous records including the medications.  Differential diagnosis considered for this patient includes: ICH / Stroke, acute coronary syndrome, Infection - UTI/Pneumonia/soft tissue infection leading to encephalopathy, encephalopathy due to electrolyte abnormality or drug interactions or toxins or HIV psychosis.  Currently patient is alert, she is not delirious, she is not having any hallucinations and is oriented to self, location and is aware how she got here.  She is not psychotic.  She had lab draw yesterday.  I independently interpreted patient's CBC and CMP from yesterday, there reassuring.  No repeat CBC or CMP indicated.  UA was ordered and it shows positive leukocytes, positive pyuria and bacteriuria.  I think it might be best in this case to give patient Keflex with questionable UTI as the cause. I will also give patient Xanax today, it appears that she will be able to  pick up her refill tomorrow.  I think benzo withdrawal might also be contributing to her symptoms, along with not getting proper sleep.  Lab results discussed with the family.  They are comfortable with the plan of taking the antibiotics now and restarting Xanax tomorrow.   Final Clinical Impression(s) / ED Diagnoses Final diagnoses:  Delirium  Lower urinary tract infectious disease    Rx / DC Orders ED Discharge Orders          Ordered    cephALEXin (KEFLEX) 500 MG capsule  2 times daily        06/06/23 1409              Derwood Kaplan, MD 06/06/23 1423

## 2023-06-06 NOTE — ED Triage Notes (Signed)
 Pt arrived in wheelchair with her daughter for evaluation following her B12 injection she received upstairs in the Cancer center. Per Pts daughter, Pt has been having hallucinations and more difficulty sleeping over the past few weeks. Pts daughter reports Pt has a Hx of UTI, and Pt endorses chronic pain.

## 2023-06-06 NOTE — Discharge Instructions (Signed)
 Take the Mobic that was prescribed for UTI. Return to the ER if your symptoms get worse.  Take your nighttime Xanax as well.  We suspect that your delirium, is likely multifactorial and due to the stress, and possibly infection and medication changes.  Follow-up with your primary care doctor in 1 week.

## 2023-06-06 NOTE — Progress Notes (Signed)
 Patient and patient's daughter presents to Cancer Center to receive B12 injection. Per pt.'s daughter pt has been having hallucinations for a couple of days and would like to see a doctor. Dr Anders Simmonds made aware. Dr Anders Simmonds at chair side to assess patient. Per MD to proceed with B12. MD recommend to patient's daughter that patient needs to go to the ER for further evaluation. Pt and pt.'s daughter verbalized understanding and agrees to plan.    Patient tolerated injection with no complaints voiced.  Site clean and dry with no bruising or swelling noted at site.  See MAR for details.  Band aid applied.  Patient stable during and after injection.  Vss with discharge and left in satisfactory condition with no s/s of distress noted.   Ozzie Knobel [employer]

## 2023-06-07 ENCOUNTER — Telehealth: Payer: Self-pay

## 2023-06-07 LAB — CBC WITH DIFFERENTIAL/PLATELET
Absolute Lymphocytes: 3304 {cells}/uL (ref 850–3900)
Absolute Monocytes: 434 {cells}/uL (ref 200–950)
Basophils Absolute: 70 {cells}/uL (ref 0–200)
Basophils Relative: 1 %
Eosinophils Absolute: 28 {cells}/uL (ref 15–500)
Eosinophils Relative: 0.4 %
HCT: 33.7 % — ABNORMAL LOW (ref 35.0–45.0)
Hemoglobin: 10.9 g/dL — ABNORMAL LOW (ref 11.7–15.5)
MCH: 31.3 pg (ref 27.0–33.0)
MCHC: 32.3 g/dL (ref 32.0–36.0)
MCV: 96.8 fL (ref 80.0–100.0)
MPV: 9.2 fL (ref 7.5–12.5)
Monocytes Relative: 6.2 %
Neutro Abs: 3164 {cells}/uL (ref 1500–7800)
Neutrophils Relative %: 45.2 %
Platelets: 312 10*3/uL (ref 140–400)
RBC: 3.48 10*6/uL — ABNORMAL LOW (ref 3.80–5.10)
RDW: 14 % (ref 11.0–15.0)
Total Lymphocyte: 47.2 %
WBC: 7 10*3/uL (ref 3.8–10.8)

## 2023-06-07 LAB — SURESWAB HSV, TYPE 1/2 DNA, PCR
HSV 1 DNA: NOT DETECTED
HSV 2 DNA: NOT DETECTED

## 2023-06-07 LAB — LIPID PANEL
Cholesterol: 140 mg/dL (ref <200)
HDL: 62 mg/dL (ref >=50)
LDL Cholesterol (Calc): 59 mg/dL
Non-HDL Cholesterol (Calc): 78 mg/dL (ref <130)
Total CHOL/HDL Ratio: 2.3 (calc) (ref <5.0)
Triglycerides: 105 mg/dL (ref <150)

## 2023-06-07 LAB — HIV-1 RNA QUANT-NO REFLEX-BLD
HIV 1 RNA Quant: NOT DETECTED {copies}/mL
HIV-1 RNA Quant, Log: NOT DETECTED {Log_copies}/mL

## 2023-06-07 LAB — COMPLETE METABOLIC PANEL WITHOUT GFR
AG Ratio: 1.5 (calc) (ref 1.0–2.5)
ALT: 5 U/L — ABNORMAL LOW (ref 6–29)
AST: 13 U/L (ref 10–35)
Albumin: 4.1 g/dL (ref 3.6–5.1)
Alkaline phosphatase (APISO): 46 U/L (ref 37–153)
BUN/Creatinine Ratio: 26 (calc) — ABNORMAL HIGH (ref 6–22)
BUN: 25 mg/dL (ref 7–25)
CO2: 20 mmol/L (ref 20–32)
Calcium: 9.7 mg/dL (ref 8.6–10.4)
Chloride: 109 mmol/L (ref 98–110)
Creat: 0.97 mg/dL — ABNORMAL HIGH (ref 0.60–0.95)
Globulin: 2.8 g/dL (ref 1.9–3.7)
Glucose, Bld: 78 mg/dL (ref 65–99)
Potassium: 4.1 mmol/L (ref 3.5–5.3)
Sodium: 141 mmol/L (ref 135–146)
Total Bilirubin: 0.4 mg/dL (ref 0.2–1.2)
Total Protein: 6.9 g/dL (ref 6.1–8.1)

## 2023-06-07 LAB — T-HELPER CELLS (CD4) COUNT (NOT AT ARMC)
CD4 % Helper T Cell: 41 % (ref 33–65)
CD4 T Cell Abs: 1056 /uL (ref 400–1790)

## 2023-06-07 LAB — RPR: RPR Ser Ql: NONREACTIVE

## 2023-06-07 NOTE — Telephone Encounter (Signed)
 Called patient's daughter and review results. Understands that Dr. Daiva Eves would like for her to continue with Valtrex trial. Verbalized understanding. She did want provider to review notes from [facility]. Will forward message. Juanita Laster, RMA

## 2023-06-07 NOTE — Telephone Encounter (Signed)
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

## 2023-06-07 NOTE — Transitions of Care (Post Inpatient/ED Visit) (Unsigned)
   06/07/2023  Name: Jessica [name] MRN: [id number] DOB: Apr 07, 1940  Today's TOC FU Call Status: Today's TOC FU Call Status:: Unsuccessful Call (1st Attempt) Unsuccessful Call (1st Attempt) Date: 06/07/23  Attempted to reach the patient regarding the most recent Inpatient/ED visit.  Follow Up Plan: Additional outreach attempts will be made to reach the patient to complete the Transitions of Care (Post Inpatient/ED visit) call.   Signature Karena Addison, LPN [facility] Nurse Health Advisor Direct Dial [phone]

## 2023-06-08 LAB — URINE CULTURE: Culture: 30000 — AB

## 2023-06-08 NOTE — Progress Notes (Signed)
 Internal Medicine Clinic Attending  Case discussed with the resident at the time of the visit.  We reviewed the resident's history and exam and pertinent patient test results.  I agree with the assessment, diagnosis, and plan of care documented in the resident's note.

## 2023-06-08 NOTE — Transitions of Care (Post Inpatient/ED Visit) (Signed)
 06/08/2023  Name: Jessica [name] MRN: [id number] DOB: 1940-05-06  Today's TOC FU Call Status: Today's TOC FU Call Status:: Successful TOC FU Call Completed Unsuccessful Call (1st Attempt) Date: 06/07/23 [facility] FU Call Complete Date: 06/08/23 Patient's Name and Date of Birth confirmed.  Transition Care Management Follow-up Telephone Call Date of Discharge: 06/06/23 [facility] Type of Discharge: Emergency Department Reason for ED Visit: Other: (UTI) How have you been since you were released from the hospital?: Better Any questions or concerns?: No  Items Reviewed: Did you receive and understand the discharge instructions provided?: Yes Medications obtained,verified, and reconciled?: Yes (Medications Reviewed) Any new allergies since your discharge?: No Dietary orders reviewed?: Yes Do you have support at home?: Yes People in Home: child(ren), adult  Medications Reviewed Today: Medications Reviewed Today     Reviewed by Karena Addison, LPN (Licensed Practical Nurse) on 06/08/23 at 1014  Med List Status: <None>   Medication Order Taking? Sig Documenting Provider Last Dose Status Informant  alendronate (FOSAMAX) 70 MG tablet [id number] No Take 1 tablet (70 mg total) by mouth every Sunday. TAKE 1 TABLET BY MOUTH ONCE WEEKLY ON SUNDAY take with a full glass of water on an empty stomach Strength: 70 mg Shon Hale, MD Taking Active Family Member  ALPRAZolam Prudy Feeler) 0.5 MG tablet [id number] No Take no more than 2 at night if needed for sleep.  Try one tab first and use the second only if absolutely needed. Masters, Florentina Addison, DO Taking Active   aspirin EC 81 MG tablet [id number] No Take 1 tablet (81 mg total) by mouth daily with breakfast. Shon Hale, MD Taking Active Family Member  atorvastatin (LIPITOR) 10 MG tablet [id number] No Take 1 tablet (10 mg total) by mouth at bedtime. Shon Hale, MD Taking Active Family Member  benazepril (LOTENSIN)  40 MG tablet [id number] No Take 1 tablet (40 mg total) by mouth daily. Shon Hale, MD Taking Active Family Member  bictegravir-emtricitabine-tenofovir AF (BIKTARVY) 50-200-25 MG TABS tablet [id number]  Take 1 tablet by mouth daily. Daiva Eves, Lisette Grinder, MD  Active   brimonidine [facility]) 0.2 % ophthalmic solution [id number] No 1 drop 3 (three) times daily. [provider] Taking Active   cephALEXin (KEFLEX) 500 MG capsule [id number]  Take 1 capsule (500 mg total) by mouth 2 (two) times daily. Derwood Kaplan, MD  Active   clopidogrel (PLAVIX) 75 MG tablet [id number] No Take 1 tablet (75 mg total) by mouth daily. Shon Hale, MD Taking Active Family Member  cyanocobalamin (VITAMIN B12) 1000 MCG tablet [id number] No Take 1 tablet (1,000 mcg total) by mouth daily. Cindie Crumbly, MD Taking Active   diclofenac Sodium (VOLTAREN) 1 % GEL [id number] No Apply 2 g topically 4 (four) times daily. APPLY TWO GRAMS TO AFFECTED AREA(S) FOUR TIMES DAILY Strength: 1 % Inez Catalina, MD Taking Active Family Member  DIPHENHYDRAMINE HCL, TOPICAL, (BENADRYL ITCH STOPPING) 2 % GEL [id number] No Apply 1 Application topically every 4 (four) hours as needed (symptoms of bugs on scalp). Inez Catalina, MD Taking Active   dorzolamide-timolol (COSOPT) 2-0.5 % ophthalmic solution [id number] No Place 1 drop into the right eye 2 (two) times daily. [provider] Taking Active Family Member  ferrous sulfate 325 (65 FE) MG tablet [id number] No Take 1 tablet (325 mg total) by mouth daily with breakfast. Tat, Onalee Hua, MD Taking Active   gabapentin (NEURONTIN) 300 MG capsule [id number] No Take 1 capsule (300 mg total) by mouth 3 (three) times daily.  Inez Catalina, MD Taking Active Family Member  oxyCODONE-acetaminophen (PERCOCET/ROXICET) 5-325 MG tablet [id number] No Take 1 tablet by mouth every 8 (eight) hours as needed for severe pain (pain score 7-10). Masters, Florentina Addison, DO Taking Active   polyethylene glycol  (MIRALAX) 17 g packet [id number] No Take 17 g by mouth daily. Masters, Florentina Addison, DO Taking Active   senna (SENOKOT) 8.6 MG TABS tablet [id number] No Take 1 tablet (8.6 mg total) by mouth daily. Masters, Florentina Addison, DO Taking Active   thiamine (VITAMIN B1) 100 MG tablet [id number] No Take 1 tablet (100 mg total) by mouth daily. Inez Catalina, MD Taking Active   valACYclovir (VALTREX) 1000 MG tablet [id number]  Take 1 tablet (1,000 mg total) by mouth 2 (two) times daily. Daiva Eves, Lisette Grinder, MD  Active             Home Care and Equipment/Supplies: Were Home Health Services Ordered?: NA Any new equipment or medical supplies ordered?: NA  Functional Questionnaire: Do you need assistance with bathing/showering or dressing?: No Do you need assistance with meal preparation?: No Do you need assistance with eating?: No Do you have difficulty maintaining continence: No Do you need assistance with getting out of bed/getting out of a chair/moving?: No Do you have difficulty managing or taking your medications?: No  Follow up appointments reviewed: PCP Follow-up appointment confirmed?: Yes Date of PCP follow-up appointment?: 06/14/23 Follow-up Provider: [facility] Follow-up appointment confirmed?: NA Do you need transportation to your follow-up appointment?: No Do you understand care options if your condition(s) worsen?: Yes-patient verbalized understanding    SIGNATURE Karena Addison, LPN [facility] Nurse Health Advisor Direct Dial [phone]

## 2023-06-09 ENCOUNTER — Telehealth (HOSPITAL_BASED_OUTPATIENT_CLINIC_OR_DEPARTMENT_OTHER): Payer: Self-pay | Admitting: *Deleted

## 2023-06-09 NOTE — Telephone Encounter (Signed)
 Post ED Visit - Positive Culture Follow-up: Unsuccessful Patient Follow-up  Culture assessed and recommendations reviewed by:  [x]  Malva Cogan, Pharm.D. []  Celedonio Miyamoto, Pharm.D., BCPS AQ-ID []  Garvin Fila, Pharm.D., BCPS []  Georgina Pillion, Pharm.D., BCPS []  [place], [address].D., BCPS, AAHIVP []  Estella Husk, Pharm.D., BCPS, AAHIVP []  Sherlynn Carbon, PharmD []  Pollyann Samples, PharmD, BCPS  Positive urine culture  Plan: Check if symptoms improving. If not improving, -Stop Cephalexin -Start Amoxicillin 500mg  PO q8h x 5 days per Melina Modena, PA-C  Unable to contact patient , letter will be sent to address on file  Patsey Berthold 06/09/2023, 4:54 PM

## 2023-06-09 NOTE — Progress Notes (Signed)
 ED Antimicrobial Stewardship Positive Culture Follow Up   Jessica [name] is an 82 y.o. female who presented to [facility] on 06/06/2023 with a chief complaint of  Chief Complaint  Patient presents with   Altered Mental Status    Recent Results (from the past 720 hours)  SureSwab HSV, Type 1/2 DNA, PCR     Status: None   Collection Time: 06/05/23  9:51 AM   Specimen: Sterile Swab  Result Value Ref Range Status   HSV 1 DNA NOT DETECTED NOT DETECTED Final   HSV 2 DNA NOT DETECTED NOT DETECTED Final  Urine Culture     Status: Abnormal   Collection Time: 06/06/23  1:44 PM   Specimen: Urine, Random  Result Value Ref Range Status   Specimen Description   Final    URINE, RANDOM Performed at [facility], [address], [place], [place] [address]    Special Requests   Final    NONE Reflexed from [id number] Performed at [facility], [address], [place], [place] [address]    Culture 30,000 COLONIES/mL ENTEROCOCCUS FAECALIS (A)  Final   Report Status 06/08/2023 FINAL  Final   Organism ID, Bacteria ENTEROCOCCUS FAECALIS (A)  Final      Susceptibility   Enterococcus faecalis - MIC*    AMPICILLIN <=2 SENSITIVE Sensitive     NITROFURANTOIN <=16 SENSITIVE Sensitive     VANCOMYCIN 1 SENSITIVE Sensitive     * 30,000 COLONIES/mL ENTEROCOCCUS FAECALIS   Presented with AMS - had UTI in past with similar symptoms. UA showing large leukocytes, rare bacteria, WBC 21-50, squamous cells 0-5.   [x]  Treated with cephalexin, organism resistant to prescribed antimicrobial []  Patient discharged originally without antimicrobial agent and treatment is now indicated  New antibiotic plan: Check if symptoms improving. If not, stop cephalexin and start amoxicillin 500 mg PO every 8 hours for 5 days total.   ED Provider: Melina Modena, PA  Thank you for allowing pharmacy to participate in this patient's care,  Sherron Monday, PharmD, BCCCP Clinical Pharmacist  Phone: [phone] 06/09/2023 9:49  AM  Please check AMION for all [facility] phone numbers After 10:00 PM, call Main Pharmacy [phone]

## 2023-06-12 DIAGNOSIS — M79672 Pain in left foot: Secondary | ICD-10-CM | POA: Diagnosis not present

## 2023-06-12 DIAGNOSIS — M79674 Pain in right toe(s): Secondary | ICD-10-CM | POA: Diagnosis not present

## 2023-06-12 DIAGNOSIS — M79671 Pain in right foot: Secondary | ICD-10-CM | POA: Diagnosis not present

## 2023-06-12 DIAGNOSIS — L110 Acquired keratosis follicularis: Secondary | ICD-10-CM | POA: Diagnosis not present

## 2023-06-12 DIAGNOSIS — M79675 Pain in left toe(s): Secondary | ICD-10-CM | POA: Diagnosis not present

## 2023-06-12 DIAGNOSIS — I739 Peripheral vascular disease, unspecified: Secondary | ICD-10-CM | POA: Diagnosis not present

## 2023-06-13 DIAGNOSIS — H401133 Primary open-angle glaucoma, bilateral, severe stage: Secondary | ICD-10-CM | POA: Diagnosis not present

## 2023-06-14 ENCOUNTER — Encounter: Payer: Self-pay | Admitting: Internal Medicine

## 2023-06-14 ENCOUNTER — Telehealth: Payer: Self-pay | Admitting: Internal Medicine

## 2023-06-14 ENCOUNTER — Ambulatory Visit: Payer: Medicare Other | Admitting: Internal Medicine

## 2023-06-14 VITALS — BP 117/47 | HR 65 | Temp 97.6°F | Wt 107.8 lb

## 2023-06-14 DIAGNOSIS — E785 Hyperlipidemia, unspecified: Secondary | ICD-10-CM

## 2023-06-14 DIAGNOSIS — H548 Legal blindness, as defined in USA: Secondary | ICD-10-CM | POA: Diagnosis not present

## 2023-06-14 DIAGNOSIS — F17200 Nicotine dependence, unspecified, uncomplicated: Secondary | ICD-10-CM | POA: Diagnosis not present

## 2023-06-14 DIAGNOSIS — F5102 Adjustment insomnia: Secondary | ICD-10-CM

## 2023-06-14 DIAGNOSIS — F4321 Adjustment disorder with depressed mood: Secondary | ICD-10-CM | POA: Diagnosis not present

## 2023-06-14 DIAGNOSIS — H5316 Psychophysical visual disturbances: Secondary | ICD-10-CM

## 2023-06-14 DIAGNOSIS — I10 Essential (primary) hypertension: Secondary | ICD-10-CM

## 2023-06-14 DIAGNOSIS — R634 Abnormal weight loss: Secondary | ICD-10-CM | POA: Diagnosis not present

## 2023-06-14 DIAGNOSIS — K5903 Drug induced constipation: Secondary | ICD-10-CM

## 2023-06-14 DIAGNOSIS — Z9181 History of falling: Secondary | ICD-10-CM

## 2023-06-14 DIAGNOSIS — R911 Solitary pulmonary nodule: Secondary | ICD-10-CM | POA: Diagnosis not present

## 2023-06-14 DIAGNOSIS — F411 Generalized anxiety disorder: Secondary | ICD-10-CM

## 2023-06-14 DIAGNOSIS — I739 Peripheral vascular disease, unspecified: Secondary | ICD-10-CM

## 2023-06-14 DIAGNOSIS — M810 Age-related osteoporosis without current pathological fracture: Secondary | ICD-10-CM

## 2023-06-14 MED ORDER — MIRTAZAPINE 7.5 MG PO TABS: 7.5000 mg | ORAL_TABLET | Freq: Every day | ORAL | 3 refills | Status: DC

## 2023-06-14 MED ORDER — ATORVASTATIN CALCIUM 10 MG PO TABS: 10.0000 mg | ORAL_TABLET | Freq: Every day | ORAL | 3 refills | Status: AC

## 2023-06-14 MED ORDER — ALENDRONATE SODIUM 70 MG PO TABS: 70.0000 mg | ORAL_TABLET | ORAL | 3 refills | Status: AC

## 2023-06-14 MED ORDER — ALPRAZOLAM 0.5 MG PO TABS: 0.5000 mg | ORAL_TABLET | Freq: Every evening | ORAL | 3 refills | Status: DC | PRN

## 2023-06-14 MED ORDER — BENAZEPRIL HCL 20 MG PO TABS: 20.0000 mg | ORAL_TABLET | Freq: Every day | ORAL | 3 refills | Status: DC

## 2023-06-14 MED ORDER — ASPIRIN 81 MG PO TBEC
81.0000 mg | DELAYED_RELEASE_TABLET | Freq: Every day | ORAL | 3 refills | Status: AC
Start: 2023-06-14 — End: ?

## 2023-06-14 MED ORDER — BENAZEPRIL HCL 20 MG PO TABS: 40.0000 mg | ORAL_TABLET | Freq: Every day | ORAL | 3 refills | Status: DC

## 2023-06-14 MED ORDER — CLOPIDOGREL BISULFATE 75 MG PO TABS: 75.0000 mg | ORAL_TABLET | Freq: Every day | ORAL | 3 refills | Status: AC

## 2023-06-14 NOTE — Patient Instructions (Signed)
 [name],  I'm so sorry to hear about your sister.  I'll keep you in my thoughts.  Let's try a new medicine to take at night to help with your sadness, your appetite, and your sleep.  Just one tablet at night.  Don't take more!  During this, you should only take one xanax tablet at night.    We are also reducing the dose of your blood pressure medicine by half.  When we lose weight, we often don't need as much medication.  See you in a couple of months!  Dr. Mayford Knife

## 2023-06-14 NOTE — Progress Notes (Signed)
 83 y.o. Jessica [name] is here for routine follow-up of multiple chronic conditions listed below.  We have been more recently addressing her functional status, chronic pain, and difficulty sleeping.  Since last visit patient has lost her sister (on home hospice care) whom she was caring for in the home.  She subsequently had an acute decline in health and was treated for UTI in ED (cephalex completed, sxs resolved).  Was noted to be B12 deficient at hematology appt and injections have been added to oral supplements (this was an acute deficiency; she had a normal level very recently).  Saw her ID doctor who treated her eyebrow bumps with valtrex; and has had an eye appt.  We spent a considerable part of visit discussing her loneliness and grief.    ROS updates - using alprazolam 0.5 mg x 2 at night to help her sleep (called in when she c/o inability to get to sleep during acute grief).  Had an episode where she walked into the door (or door frame?) and hit her R face (eyebrow and cheek beneath her R eye) and fell to ground; was able to make it back to her bed independently.  No bladder sxs but having more constipation; another provider recommended adding miralax and senakot (she had found daily prunes to be inadequate).  Reports eating ok but further inquiry reveals only one meal a day and she has been losing weight since an employed caregiver stopped coming a couple of months ago (they are hoping to hire her again; she was on maternity leave).  Jessica [name] hallucinations have improved, though they remain a part of her life - she is very aware of them.  Recently there were 6 young girls following her around; on another occasion she was watching tv through the windshield while standing in a car - she is entertained by these for the most part.  She continues to have constant images of a block pattern (recently red and white) on wall and floor surfaces.    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   Iron deficiency anemia due to chronic blood loss 12/28/2022   GI bleed hospitalized Fall 2024, no source found 12/27/2022   Nocturia more than twice per night 11/15/2022   Osteoarthritis of left hip 06/03/2021   Legal blindness 04/07/2021   Insomnia 10/20/2019   Opioid dependence with current use (HCC) 04/12/2018   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 01/04/2018   Diastolic dysfunction 09/28/2017   Marijuana use 07/03/2016   Bilateral sensorineural hearing loss 06/11/2014   Subjective tinnitus of both ears 05/18/2014   Right rotator cuff tear 02/01/2013   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   Generalized anxiety disorder 04/05/2012   Mixed hyperlipidemia 04/05/2012   Hyperlipidemia with target low density lipoprotein (LDL) cholesterol less than 100 mg/dL 16/12/9602   Smoker 54/11/8117   History of small bowel obstruction 02/08/2012   PAD (peripheral artery disease) (HCC) 11/01/2011   Constipation due to pain medication 04/27/2010   Genital herpes 07/20/2006   Essential hypertension 07/20/2006   Lumbar degenerative disc disease 07/20/2006   Human immunodeficiency virus disease (HCC) 03/27/1986    Current Outpatient Medications:    mirtazapine (REMERON) 7.5 MG tablet, Take 1 tablet (  7.5 mg total) by mouth at bedtime., Disp: 30 tablet, Rfl: 3   alendronate (FOSAMAX) 70 MG tablet, Take 1 tablet (70 mg total) by mouth every Sunday. TAKE 1 TABLET BY MOUTH ONCE WEEKLY ON SUNDAY take with a full glass of water on an empty stomach Strength: 70 mg, Disp: 12 tablet, Rfl: 3   ALPRAZolam (XANAX) 0.5 MG tablet, Take no more than 2 at night if needed for sleep.  Try one tab first and use the second only if absolutely  needed., Disp: 10 tablet, Rfl: 0   aspirin EC 81 MG tablet, Take 1 tablet (81 mg total) by mouth daily with breakfast., Disp: 30 tablet, Rfl: 5   atorvastatin (LIPITOR) 10 MG tablet, Take 1 tablet (10 mg total) by mouth at bedtime., Disp: 90 tablet, Rfl: 3   benazepril (LOTENSIN) 20 MG tablet, Take 1 tablet (20 mg total) by mouth daily., Disp: 90 tablet, Rfl: 3   bictegravir-emtricitabine-tenofovir AF (BIKTARVY) 50-200-25 MG TABS tablet, Take 1 tablet by mouth daily., Disp: 30 tablet, Rfl: 11   brimonidine (ALPHAGAN) 0.2 % ophthalmic solution, 1 drop 3 (three) times daily., Disp: , Rfl:    clopidogrel (PLAVIX) 75 MG tablet, Take 1 tablet (75 mg total) by mouth daily., Disp: 90 tablet, Rfl: 3   cyanocobalamin (VITAMIN B12) 1000 MCG tablet, Take 1 tablet (1,000 mcg total) by mouth daily., Disp: 30 tablet, Rfl: 2   diclofenac Sodium (VOLTAREN) 1 % GEL, Apply 2 g topically 4 (four) times daily. APPLY TWO GRAMS TO AFFECTED AREA(S) FOUR TIMES DAILY Strength: 1 %, Disp: 350 g, Rfl: 2   DIPHENHYDRAMINE HCL, TOPICAL, (BENADRYL ITCH STOPPING) 2 % GEL, Apply 1 Application topically every 4 (four) hours as needed (symptoms of bugs on scalp)., Disp: 57 g, Rfl: 3   dorzolamide-timolol (COSOPT) 2-0.5 % ophthalmic solution, Place 1 drop into the right eye 2 (two) times daily., Disp: , Rfl:    ferrous sulfate 325 (65 FE) MG tablet, Take 1 tablet (325 mg total) by mouth daily with breakfast., Disp: , Rfl:    gabapentin (NEURONTIN) 300 MG capsule, Take 1 capsule (300 mg total) by mouth 3 (three) times daily., Disp: 270 capsule, Rfl: 3   oxyCODONE-acetaminophen (PERCOCET/ROXICET) 5-325 MG tablet, Take 1 tablet by mouth every 8 (eight) hours as needed for severe pain (pain score 7-10)., Disp: 60 tablet, Rfl: 0   polyethylene glycol (MIRALAX) 17 g packet, Take 17 g by mouth daily., Disp: 90 packet, Rfl: 2   senna (SENOKOT) 8.6 MG TABS tablet, Take 1 tablet (8.6 mg total) by mouth daily., Disp: 120 tablet, Rfl: 0    thiamine (VITAMIN B1) 100 MG tablet, Take 1 tablet (100 mg total) by mouth daily., Disp: 90 tablet, Rfl: 3   valACYclovir (VALTREX) 1000 MG tablet, Take 1 tablet (1,000 mg total) by mouth 2 (two) times daily., Disp: 28 tablet, Rfl: 0  Functional Status: Lives alone (sister was living with her until her death 30-Apr-2023); her daughter and care support, [name],  lives [place], about 40-45 minutes away.  Ms. Trilby Drummer is legally blind.  She walks carefully using a cane or the walls/furniture to prevent falling; she has a bad back and chronically painful left hip.  She walks to the bathroom or uses a BSC when needed.  Sometimes she crawls from the bathroom back to bed if she is feeling dizzy.  She has recently acquired a shower chair.  She dresses and eats independently.  Is able to microwave prepared meals,  and eats take-out meals brought by her son on occasion or food brought by [name].    Objective BP (!) 117/47 (BP Location: Right Arm, Patient Position: Sitting, Cuff Size: Small)   Pulse 65   Temp 97.6 F (36.4 C) (Oral)   Wt 107 lb 12.8 oz (48.9 kg)   SpO2 100%   BMI 17.01 kg/m   Exam: Thin, frail appearing lady, nicely dressed and groomed, alert and appropriately interactive and conversant.  Soft tissues noticeably diminished; bony protuberances notable.  Periphery warm and well perfused.  Skin turgor decreased.  No tremor.  Nml motor tone.    Problems addressed today (in no particular order):  Grief Assessment & Plan: Sister with whom she lived, and the last remaining sibling, passed away 2023/05/16.  She is now living alone and experiencing loneliness and loss.  She was allowed space to process and cry today, together with her daughter [name].  She isn't sure yet if she would be interested in counseling.  In order to address her desire to sleep, to increase her appetite, and potentially help with mood, we will trial mirtazapine 7.5 mg nightly.  She is advised to stop it if she is uncomfortable  with any potential side effects.  Max dose would be 15 mg if increase needed.  Orders: -     Mirtazapine; Take 1 tablet (7.5 mg total) by mouth at bedtime.  Dispense: 30 tablet; Refill: 3  Essential hypertension Assessment & Plan: Amlodipine stopped at last visit; BP remains low, will decrease benazapril from 40 mg to 20 mg daily.  Decreased BP attributed to unintended weight loss (insufficient calories). High fall risk, BP treatment should be assessed for sxs of dizziness (which she has had recently).  Orders: -     Benazepril HCl; Take 1 tablet 20 mg by mouth daily.  Dispense: 90 tablet; Refill: 3  Adjustment insomnia Assessment & Plan: See also Grief.  She desires a pill to help her go to sleep, and endorses periods of worry when trying to do so.  She has used the alprazolam for this purpose for years.  Will trial mirtazapine.  She will take only 0.5 (and not 1.0) mg of alprazolam at night if needed, with ongoing goal to taper off altogether.  We discussed her high risk for falls and injury.  Orders: -     Mirtazapine; Take 1 tablet (7.5 mg total) by mouth at bedtime.  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  Hallucinations fluctuate in frequency. Non threatening and she recognizes them for what they are.  Sees visions of people and animals, and nearly always sees a pattern of blocks of color on the walls and floors.  Monitor.   Legal blindness Assessment & Plan: Saw her ophthalmologist in [facility] yesterday and was deemed legally blind.  [facility] has meetings/gathering for individuals living with blindness; they have attended a couple.      Constipation due to pain medication Assessment & Plan: Constipation due to Fe and opioid. Prunes insufficient. Has begun miralax and senokot (advised by another provider).   At high risk for falls Assessment & Plan: Discussed  again:  BP meds to be further reduced. Will reduce alprazolam again to 0.5 mg nightly with ongoing goal to taper off. L hip pain Blindness  She is now alone in the house which is concerning.  A life alert or similar may  be helpful.   Unintended weight loss Assessment & Plan: Eats well when she has food/meals provided, but is only eating one meal a day.  She relies on microwaved meals.  Daughter is working on enrolling her in meal delivery, they will begin utilizing ensures as meal supplements, and will try to hire back the former aide who had been preparing her and her sister's meals before she went on maternity leave.    Smoker Assessment & Plan: Reviewed lung cancer screening history in light of her weight loss; had CT screen 06/2023 with a small nodule, suspected benign, but suggested for f/u in 53M.  I will order f/u CT now.    Orders: -     CT CHEST LCS NODULE F/U LOW DOSE WO CONTRAST; Future  Lung nodule seen on imaging study -     CT CHEST LCS NODULE F/U LOW DOSE WO CONTRAST; Future     Return in about 8 weeks (around 08/09/2023) for chronic condition monitoring, symptom re-check, med review.

## 2023-06-14 NOTE — Assessment & Plan Note (Signed)
 Continued part of her existence.  Hallucinations fluctuate in frequency. Non threatening and she recognizes them for what they are.  Sees visions of people and animals, and nearly always sees a pattern of blocks of color on the walls and floors.  Monitor.

## 2023-06-14 NOTE — Assessment & Plan Note (Signed)
 Saw her ophthalmologist in [place] yesterday and was deemed legally blind.  [facility] has meetings/gathering for individuals living with blindness; they have attended a couple.

## 2023-06-14 NOTE — Assessment & Plan Note (Signed)
 Amlodipine stopped at last visit; BP remains low, will decrease benazapril from 40 mg to 20 mg daily.  Decreased BP attributed to unintended weight loss (insufficient calories). High fall risk, BP treatment should be assessed for sxs of dizziness (which she has had recently).

## 2023-06-14 NOTE — Assessment & Plan Note (Signed)
 Constipation due to Fe and opioid. Prunes insufficient. Has begun miralax and senokot (advised by another provider).

## 2023-06-14 NOTE — Assessment & Plan Note (Signed)
 Eats well when she has food/meals provided, but is only eating one meal a day.  She relies on microwaved meals.  Daughter is working on enrolling her in meal delivery, they will begin utilizing ensures as meal supplements, and will try to hire back the former aide who had been preparing her and her sister's meals before she went on maternity leave.

## 2023-06-14 NOTE — Assessment & Plan Note (Signed)
 Reviewed lung cancer screening history in light of her weight loss; had CT screen 06/2023 with a small nodule, suspected benign, but suggested for f/u in 58M.  I will order f/u CT now.

## 2023-06-14 NOTE — Assessment & Plan Note (Signed)
 Discussed again:  BP meds to be further reduced. Will reduce alprazolam again to 0.5 mg nightly with ongoing goal to taper off. L hip pain Blindness  She is now alone in the house which is concerning.  A life alert or similar may be helpful.

## 2023-06-14 NOTE — Telephone Encounter (Signed)
 Please see message below   Copied from CRM [id number]. Topic: General - Other >> Jun 14, 2023 12:16 PM Corin V wrote: Reason for CRM: Patient and her daughter called to find out about the name of an office that would help with insurance distribution. She said she had been going back in 2016 and Nurse May should know the name of it. She is needing assistance with applying for additional insurance services.

## 2023-06-14 NOTE — Assessment & Plan Note (Signed)
 Sister with whom she lived, and the last remaining sibling, passed away 2023/05/11.  She is now living alone and experiencing loneliness and loss.  She was allowed space to process and cry today, together with her daughter [name].  She isn't sure yet if she would be interested in counseling.  In order to address her desire to sleep, to increase her appetite, and potentially help with mood, we will trial mirtazapine 7.5 mg nightly.  She is advised to stop it if she is uncomfortable with any potential side effects.  Max dose would be 15 mg if increase needed.

## 2023-06-14 NOTE — Assessment & Plan Note (Signed)
 See also Grief.  She desires a pill to help her go to sleep, and endorses periods of worry when trying to do so.  She has used the alprazolam for this purpose for years.  Will trial mirtazapine.  She will take only 0.5 (and not 1.0) mg of alprazolam at night if needed, with ongoing goal to taper off altogether.  We discussed her high risk for falls and injury.

## 2023-06-18 NOTE — Telephone Encounter (Signed)
 I called pt back, no answer and unable to LVM for pt to call back.

## 2023-06-22 ENCOUNTER — Encounter

## 2023-07-03 ENCOUNTER — Other Ambulatory Visit: Payer: Self-pay | Admitting: Internal Medicine

## 2023-07-03 DIAGNOSIS — M1612 Unilateral primary osteoarthritis, left hip: Secondary | ICD-10-CM

## 2023-07-03 NOTE — Telephone Encounter (Signed)
 Copied from CRM [id number]. Topic: Clinical - Medication Refill >> Jul 03, 2023  8:54 AM Blair Bumpers wrote: Most Recent Primary Care Visit:  Provider: WILLIAMS, JULIE ANNE  Department: [facility]  Visit Type: OPEN ESTABLISHED  Date: 06/14/2023  Medication: oxyCODONE -acetaminophen  (PERCOCET/ROXICET) 5-325 MG tablet  Has the patient contacted their pharmacy? Yes, pharmacy sent request but hasn't received a response.  (Agent: If no, request that the patient contact the pharmacy for the refill. If patient does not wish to contact the pharmacy document the reason why and proceed with request.) (Agent: If yes, when and what did the pharmacy advise?)  Is this the correct pharmacy for this prescription? Yes If no, delete pharmacy and type the correct one.  This is the patient's preferred pharmacy:  [facility] - [place], [place] - [address] [place] [address] Phone: [phone] Fax: [phone]   Has the prescription been filled recently? Yes  Is the patient out of the medication? Yes & needs it as soon as possible. Not feeling well.   Has the patient been seen for an appointment in the last year OR does the patient have an upcoming appointment? Yes  Can we respond through MyChart? Yes  Agent: Please be advised that Rx refills may take up to 3 business days. We ask that you follow-up with your pharmacy.

## 2023-07-05 ENCOUNTER — Other Ambulatory Visit: Payer: Self-pay | Admitting: Internal Medicine

## 2023-07-05 DIAGNOSIS — M1612 Unilateral primary osteoarthritis, left hip: Secondary | ICD-10-CM

## 2023-07-05 NOTE — Telephone Encounter (Signed)
 Last rx written - 05/30/23. Last OV - 06/14/23. Next OV - 08/16/23.

## 2023-07-05 NOTE — Telephone Encounter (Unsigned)
 Copied from CRM [id number]. Topic: Clinical - Prescription Issue >> Jul 05, 2023  9:26 AM [name] wrote: Reason for CRM: Patient's daughter [name] would like a status update on the patient medication refill request for oxyCODONE -acetaminophen  (PERCOCET/ROXICET) 5-325 MG tablet, please give her a call back at [phone].

## 2023-07-05 NOTE — Telephone Encounter (Signed)
 Copied from CRM [id number]. Topic: Clinical - Prescription Issue >> Jul 05, 2023  9:26 AM [name] wrote: Reason for CRM: Patient's daughter [name] would like a status update on the patient medication refill request for oxyCODONE -acetaminophen  (PERCOCET/ROXICET) 5-325 MG tablet, please give her a call back at [phone]. >> Jul 05, 2023  1:48 PM [name] wrote: Patient called back again today wanting to get an update on her oxycodone  medication. Please call her daughter Timpy back at [phone]

## 2023-07-06 ENCOUNTER — Other Ambulatory Visit: Payer: Self-pay | Admitting: Internal Medicine

## 2023-07-06 DIAGNOSIS — M1612 Unilateral primary osteoarthritis, left hip: Secondary | ICD-10-CM

## 2023-07-06 MED ORDER — OXYCODONE-ACETAMINOPHEN 5-325 MG PO TABS: 1.0000 | ORAL_TABLET | Freq: Three times a day (TID) | ORAL | 0 refills | Status: DC | PRN

## 2023-07-06 NOTE — Telephone Encounter (Signed)
 Medication refill has already been addressed by pcp.

## 2023-07-06 NOTE — Telephone Encounter (Signed)
 Call to patient's daughter -informed her that prescription for the Percocet had ben done and sent to the Pharmacy for patient.

## 2023-07-06 NOTE — Telephone Encounter (Signed)
Duplicate, already refilled.

## 2023-07-06 NOTE — Telephone Encounter (Signed)
 Copied from CRM [id number]. Topic: Clinical - Prescription Issue >> Jul 05, 2023  9:26 AM [name] wrote: Reason for CRM: Patient's daughter [name] would like a status update on the patient medication refill request for oxyCODONE -acetaminophen  (PERCOCET/ROXICET) 5-325 MG tablet, please give her a call back at [phone]. >> Jul 06, 2023  8:40 AM Valeri Gate H wrote: Patient's daughter calling back again for an update on refill medication, says her mom has never had to wait like this before for her medication and she may just have to take her to the emergency room. Medication was sent to the doctor and is still pending. Transferred her to [facility] in the clinic.  >> Jul 05, 2023  1:48 PM [name] wrote: Patient called back again today wanting to get an update on her oxycodone  medication. Please call her daughter Timpy back at [phone]

## 2023-07-10 ENCOUNTER — Ambulatory Visit: Admitting: Family Medicine

## 2023-07-12 ENCOUNTER — Ambulatory Visit: Admitting: Sports Medicine

## 2023-07-12 ENCOUNTER — Other Ambulatory Visit: Payer: Self-pay

## 2023-07-12 VITALS — BP 137/62 | Ht 66.75 in | Wt 108.0 lb

## 2023-07-12 DIAGNOSIS — M1612 Unilateral primary osteoarthritis, left hip: Secondary | ICD-10-CM

## 2023-07-12 MED ORDER — METHYLPREDNISOLONE ACETATE 40 MG/ML IJ SUSP
40.0000 mg | Freq: Once | INTRAMUSCULAR | Status: AC
  Administered 2023-07-12: 40 mg via INTRA_ARTICULAR

## 2023-07-12 NOTE — Assessment & Plan Note (Signed)
 Patient with known Lumbar DDD and radiculopathy as well as mild-moderate left hip OA here with acute on chronic left hip pain. Classic C-sign in office today and her testing is grossly + for intra-articular etiology of her pain.  Plan: -Independent review of her left hip x-rays from 03/2021 as above -Recommended intra-articular hip injection today which she was amenable to. See below. -Continue as needed tylenol  and ibuprofen . -Advised to monitor pain response over the next 4 weeks, if failing to get benefit would recommend attention back to the lumbar spine. Patient's questions were answered and they are in agreement with this plan.

## 2023-07-12 NOTE — Progress Notes (Signed)
 PCP: [name], MD  SUBJECTIVE:   HPI:  Patient is a 83 y.o. female here for follow-up of left hip pain. Patient is legally blind and accompanied by daughter today.  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 She was seen for this in 03/2023 but pain though to be primarily radicular pain from her lumbar spine. She was treated with IM Depo and Prednisone  taper. She is also on Gabapentin  300mg  TID which helps.  Today she reports that the IM depo and prednisone  taper provided her relief for roughly 8 weeks. Her pain today is both in the buttock and groin on the left side. She feels it radiates up from there. Denies any radiating pain or N/T down the leg currently. Denies incontinence. Open to trying an alternative approach today.  Pertinent ROS were reviewed with the patient and found to be negative unless otherwise specified above in HPI.   PERTINENT  PMH / PSH / FH / SH:  Past Medical, Surgical, Social, and Family History Reviewed & Updated in the EMR.  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 Linell Rhymes; "took out 12 inches of small intestiines and removed blockage"   COLONOSCOPY  2012   COLONOSCOPY WITH PROPOFOL  N/A 12/29/2022   Procedure: COLONOSCOPY WITH PROPOFOL ;  Surgeon: Urban Garden, MD;  [facility]: [facility];  Service: Gastroenterology;  Laterality: N/A;   ESOPHAGOGASTRODUODENOSCOPY (EGD) WITH PROPOFOL  N/A 12/28/2022   Procedure: ESOPHAGOGASTRODUODENOSCOPY (EGD) WITH PROPOFOL ;  Surgeon: Hargis Lias, MD;  [facility]: [facility];  Service: Endoscopy;  Laterality: N/A;   EYE SURGERY     EYE SURGERY  06/29/2020   06-29-2020- RIGHT CATARACT REMOVED AND LEFT EYE SURGERY TO REMOVE SAND LIKE SUBSTANCE    FEMORAL ARTERY EXPLORATION Left 07/10/2020   Procedure: REDO LEFT FEMORAL ARTERY EXPOSURE;  Surgeon: Margherita Shell, MD;  Location: [facility];  Service: Vascular;  Laterality: Left;   GIVENS CAPSULE STUDY N/A 12/30/2022   Procedure: GIVENS CAPSULE STUDY;  Surgeon: Urban Garden, MD;  Location: [facility];  Service: Gastroenterology;  Laterality: N/A;   LAPAROTOMY  02/12/2012   Procedure: EXPLORATORY LAPAROTOMY;  Surgeon: Lockie Rima, MD;  Location: [facility];  Service: General;  Laterality: N/A;  Exploratory Laparotomy, lysis of adhesions   SMALL INTESTINE SURGERY     THROMBECTOMY FEMORAL ARTERY Left 07/10/2020   Procedure: THROMBECTOMY AORTA-BIFEMORAL GRAFT, PROFUNDA, AND SUPERFICIAL FEMORAL ARTERY  LEFT LEG;  Surgeon: Margherita Shell, MD;  Location: [facility];  Service: Vascular;  Laterality: Left;    Allergies  Allergen Reactions   Hctz [Hydrochlorothiazide ] Other (See Comments)    Dizziness, syncope; does NOT wish to take anymore    OBJECTIVE:  BP 137/62   Ht 5' 6.75" (1.695 m)   Wt 108 lb (49 kg)   BMI 17.04 kg/m   PHYSICAL EXAM:  GEN: Alert and Oriented, NAD, comfortable in exam room RESP: Unlabored respirations, symmetric chest rise PSY: normal mood, congruent affect  BACK/LEFT HIP MSK EXAM: No deformity or swelling. Full range of motion with 5/5 strength. TTP over anterior hip joint and posterior hip joint. No lumbar paraspinal or midline TTP. SI joints non-tender. Greater Troch non-tender. +Stinchfield Negative SLR. +FABER and FADIR. Neurovascularly intact distally.  Independent review of her left hip x-rays from 03/2021 show moderate narrowing of the femoroacetabulum joint without significant spurring or other acute abnormalities. Assessment & Plan Primary  osteoarthritis of left hip Patient with known Lumbar DDD and radiculopathy as well as mild-moderate left hip OA here with acute on chronic left hip pain. Classic C-sign in office today and her testing is grossly + for intra-articular etiology of her pain.  Plan: -Independent review of her left hip x-rays from 03/2021 as above -Recommended intra-articular hip injection today which she was amenable to. See below. -Continue as needed tylenol  and ibuprofen . -Advised to monitor pain response over the next 4 weeks, if failing to get benefit would recommend attention back to the lumbar spine. Patient's questions were answered and they are in agreement with this plan.   Procedure: U/S Guided Left Hip Joint Injection After discussion of the risks, benefits, and alternatives to injection the patient provided verbal and written consent to proceed.  A preprocedural timeout was conducted to verify correct patient, procedure, and site.  Prior to the procedure the anterior left hip was examined with a curvilinear probe with visualization of the femoral neck, femoral head, femoral acetabular joint, and overlying joint capsule.  Color Doppler flow utilized to visualize vasculature and optimal needle track.  Following this the skin was marked and prepped with chlorhexidine  scrub.  The area was again reexamined using the same transducer and sterile gel in the "no touch" technique.  Local anesthesia was obtained using a 1.5 inch 27 gauge needle with 3 mL 1% lidocaine  under ultrasound guidance.  Then under ultrasound guidance a 2.5 inch 22-gauge needle was advanced into the hip joint using in-plane approach.  A mixture of 4 mL 1% lidocaine  and 1 mL of Depo-Medrol  40 mg/mL was injected with good visualization of injectate within the capsule. Patient tolerated procedure well without immediate complications. The patient was counseled as to the expected post-injection course, including the possibility of worsening of pain with  steroid flare. Instructed as to concerning symptoms and advised to contact the office if these should arise.    Lin Rend, MD PGY-4, Sports Medicine Fellow [facility]

## 2023-07-18 ENCOUNTER — Ambulatory Visit

## 2023-07-18 ENCOUNTER — Other Ambulatory Visit: Payer: Self-pay

## 2023-07-24 ENCOUNTER — Other Ambulatory Visit: Payer: Self-pay | Admitting: Internal Medicine

## 2023-07-24 DIAGNOSIS — I10 Essential (primary) hypertension: Secondary | ICD-10-CM

## 2023-07-24 NOTE — Telephone Encounter (Signed)
 Medication discontinued 09/27/2022

## 2023-07-25 ENCOUNTER — Other Ambulatory Visit: Payer: Self-pay

## 2023-07-25 DIAGNOSIS — B20 Human immunodeficiency virus [HIV] disease: Secondary | ICD-10-CM

## 2023-07-30 ENCOUNTER — Other Ambulatory Visit: Payer: Self-pay

## 2023-07-30 ENCOUNTER — Other Ambulatory Visit: Payer: Self-pay | Admitting: Internal Medicine

## 2023-07-30 ENCOUNTER — Other Ambulatory Visit

## 2023-07-30 DIAGNOSIS — B20 Human immunodeficiency virus [HIV] disease: Secondary | ICD-10-CM

## 2023-07-30 DIAGNOSIS — M1612 Unilateral primary osteoarthritis, left hip: Secondary | ICD-10-CM

## 2023-07-30 MED ORDER — OXYCODONE-ACETAMINOPHEN 5-325 MG PO TABS: ORAL_TABLET | ORAL | 0 refills | Status: DC

## 2023-07-30 NOTE — Telephone Encounter (Signed)
 Copied from CRM [id number]. Topic: Clinical - Medication Refill >> Jul 30, 2023  2:19 PM Shamecia H wrote: Medication: oxyCODONE -acetaminophen  (PERCOCET/ROXICET) 5-325 MG tablet, ALPRAZolam  (XANAX ) 0.5 MG tablet  Has the patient contacted their pharmacy? Yes (Agent: If no, request that the patient contact the pharmacy for the refill. If patient does not wish to contact the pharmacy document the reason why and proceed with request.) (Agent: If yes, when and what did the pharmacy advise?)  This is the patient's preferred pharmacy:  [facility] - [place], [place] - [address] [place] [address] Phone: [phone] Fax: [phone]  Is this the correct pharmacy for this prescription? Yes If no, delete pharmacy and type the correct one.   Has the prescription been filled recently? Yes  Is the patient out of the medication? No  Has the patient been seen for an appointment in the last year OR does the patient have an upcoming appointment? Yes  Can we respond through MyChart? Yes  Agent: Please be advised that Rx refills may take up to 3 business days. We ask that you follow-up with your pharmacy.

## 2023-07-31 ENCOUNTER — Other Ambulatory Visit

## 2023-08-01 LAB — HIV-1 RNA QUANT-NO REFLEX-BLD
HIV 1 RNA Quant: <20 {copies}/mL — AB
HIV-1 RNA Quant, Log: <1.3 {Log_copies}/mL — AB

## 2023-08-01 LAB — T-HELPER CELLS (CD4) COUNT (NOT AT ARMC)
Absolute CD4: 1137 {cells}/uL (ref 490–1740)
CD4 T Helper %: 41 % (ref 30–61)
Total lymphocyte count: 2760 {cells}/uL (ref 850–3900)

## 2023-08-02 ENCOUNTER — Other Ambulatory Visit: Payer: Self-pay | Admitting: Internal Medicine

## 2023-08-02 DIAGNOSIS — I10 Essential (primary) hypertension: Secondary | ICD-10-CM

## 2023-08-03 NOTE — Telephone Encounter (Signed)
 Medication discontinued 09/27/2022

## 2023-08-08 ENCOUNTER — Ambulatory Visit (INDEPENDENT_AMBULATORY_CARE_PROVIDER_SITE_OTHER): Admitting: Sports Medicine

## 2023-08-08 VITALS — BP 138/69 | Ht 66.75 in | Wt 112.0 lb

## 2023-08-08 DIAGNOSIS — M5136 Other intervertebral disc degeneration, lumbar region with discogenic back pain only: Secondary | ICD-10-CM

## 2023-08-08 DIAGNOSIS — M1612 Unilateral primary osteoarthritis, left hip: Secondary | ICD-10-CM

## 2023-08-08 MED ORDER — METHYLPREDNISOLONE ACETATE 40 MG/ML IJ SUSP
80.0000 mg | Freq: Once | INTRAMUSCULAR | Status: AC
  Administered 2023-08-08: 80 mg via INTRAMUSCULAR

## 2023-08-08 MED ORDER — PREDNISONE 10 MG PO TABS: ORAL_TABLET | ORAL | 0 refills | Status: DC

## 2023-08-08 NOTE — Assessment & Plan Note (Signed)
 Known moderate left hip osteoarthritis on x-ray images from 03/2021.  She only got short-lived 2 to 3 weeks of benefit from intra-articular cortisone injection in the left hip earlier this month.  Today we discussed definitive management being in the form of a hip replacement, however patient expressed that she would not be interested in this at this time.  Plan: -IM Depo-Medrol  80 mg followed by short prednisone  taper sent to  for pain control -Discussed additional pain mitigation strategies continuing her Percocet as well as additional Tylenol  500 to 650 mg 3 times daily -Discussed as needed NSAID use as well -Will follow-up as needed or if patient has further interest in possible hip arthroplasty. Patient's questions were answered and they are in agreement with this plan.

## 2023-08-08 NOTE — Patient Instructions (Addendum)
 Your pain is coming from arthritis in the left hip. Since the injection into the hip joint did not provide you long lasting relief, the next step would be to consider a hip replacement. In the meantime you may continue the Oxycodone -Tylenol  (Percocet) and take an additional tylenol  500-650mg  every 8 hours. We did an IM injection of Depomedrol as well as a prescription for oral prednisone .   Follow-up with us  as needed moving forward.

## 2023-08-08 NOTE — Assessment & Plan Note (Signed)
 Stable chronic issue. Do not think her presenting pain today is related to radiculopathy based on her exam and clinical benefit from the intra-articular hip injection performed earlier this month.

## 2023-08-08 NOTE — Progress Notes (Signed)
   PCP: [name], MD  SUBJECTIVE:   HPI:  Patient is a 83 y.o. female here for follow-up on her left hip. Patient is legally blind and accompanied by daughter today.   She had an intra-articular left hip injection done earlier this month and reports only 2 maybe 3 weeks of pain relief.  Her pain is back and worse than before today.  She had previously been getting IM Depo injections which were giving her roughly 8 weeks of relief.  She states that she would not want to pursue surgery/hip replacement. She is on Percocet for chronic pain through her PCP and also taking additional tylenol .  Pertinent ROS were reviewed with the patient and found to be negative unless otherwise specified above in HPI.   PERTINENT  PMH / PSH / FH / SH:  Past Medical, Surgical, Social, and Family History Reviewed & Updated in the EMR.  Pertinent findings include:  Lumbar DDD w/ h/o radiculopathy Left hip OA HIV, undetected RNA quant on most recent labs reviewed from 07/30/2023  Allergies  Allergen Reactions   Hctz [Hydrochlorothiazide ] Other (See Comments)    Dizziness, syncope; does NOT wish to take anymore    OBJECTIVE:  BP 138/69   Ht 5' 6.75" (1.695 m)   Wt 112 lb (50.8 kg)   BMI 17.67 kg/m   PHYSICAL EXAM:  GEN: Alert and Oriented, NAD, thin elderly female, comfortable in exam room RESP: Unlabored respirations, symmetric chest rise PSY: normal mood, congruent affect   BACK/LEFT HIP MSK EXAM: No gross deformity, scoliosis, hip swelling. TTP over anterior hip joint and posterior hip joint.  No midline or bony spinal TTP. Greater trochanter and SI joints non-tender. Hip ROM limited in flexion and IR, preserved ER. +FADIR and FABER. Strength LEs 4/5 all muscle groups.   2+ MSRs in patellar and achilles tendons, equal bilaterally. Negative SLRs. Sensation intact to light touch bilaterally. Assessment & Plan Primary osteoarthritis of left hip Known moderate left hip osteoarthritis on  x-ray images from 03/2021.  She only got short-lived 2 to 3 weeks of benefit from intra-articular cortisone injection in the left hip earlier this month.  Today we discussed definitive management being in the form of a hip replacement, however patient expressed that she would not be interested in this at this time.  Plan: -IM Depo-Medrol  80 mg followed by short prednisone  taper sent to  for pain control -Discussed additional pain mitigation strategies continuing her Percocet as well as additional Tylenol  500 to 650 mg 3 times daily -Discussed as needed NSAID use as well -Will follow-up as needed or if patient has further interest in possible hip arthroplasty. Patient's questions were answered and they are in agreement with this plan. Degeneration of intervertebral disc of lumbar region with discogenic back pain Stable chronic issue. Do not think her presenting pain today is related to radiculopathy based on her exam and clinical benefit from the intra-articular hip injection performed earlier this month.    Lin Rend, MD PGY-4, Sports Medicine Fellow [facility]
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
## 2023-08-14 ENCOUNTER — Other Ambulatory Visit: Payer: Self-pay

## 2023-08-14 ENCOUNTER — Ambulatory Visit (INDEPENDENT_AMBULATORY_CARE_PROVIDER_SITE_OTHER): Admitting: Infectious Disease

## 2023-08-14 ENCOUNTER — Encounter: Payer: Self-pay | Admitting: Infectious Disease

## 2023-08-14 VITALS — BP 172/81 | HR 61 | Temp 97.4°F | Wt 112.2 lb

## 2023-08-14 DIAGNOSIS — R634 Abnormal weight loss: Secondary | ICD-10-CM | POA: Diagnosis not present

## 2023-08-14 DIAGNOSIS — F4321 Adjustment disorder with depressed mood: Secondary | ICD-10-CM

## 2023-08-14 DIAGNOSIS — F17200 Nicotine dependence, unspecified, uncomplicated: Secondary | ICD-10-CM | POA: Diagnosis not present

## 2023-08-14 DIAGNOSIS — I10 Essential (primary) hypertension: Secondary | ICD-10-CM | POA: Diagnosis not present

## 2023-08-14 DIAGNOSIS — M1612 Unilateral primary osteoarthritis, left hip: Secondary | ICD-10-CM

## 2023-08-14 DIAGNOSIS — H548 Legal blindness, as defined in USA: Secondary | ICD-10-CM

## 2023-08-14 DIAGNOSIS — F22 Delusional disorders: Secondary | ICD-10-CM

## 2023-08-14 DIAGNOSIS — I739 Peripheral vascular disease, unspecified: Secondary | ICD-10-CM | POA: Diagnosis not present

## 2023-08-14 DIAGNOSIS — B20 Human immunodeficiency virus [HIV] disease: Secondary | ICD-10-CM

## 2023-08-14 DIAGNOSIS — E785 Hyperlipidemia, unspecified: Secondary | ICD-10-CM | POA: Diagnosis not present

## 2023-08-14 DIAGNOSIS — E782 Mixed hyperlipidemia: Secondary | ICD-10-CM

## 2023-08-14 DIAGNOSIS — M5136 Other intervertebral disc degeneration, lumbar region with discogenic back pain only: Secondary | ICD-10-CM | POA: Diagnosis not present

## 2023-08-14 DIAGNOSIS — H5316 Psychophysical visual disturbances: Secondary | ICD-10-CM

## 2023-08-14 DIAGNOSIS — F1120 Opioid dependence, uncomplicated: Secondary | ICD-10-CM

## 2023-08-14 MED ORDER — BIKTARVY 50-200-25 MG PO TABS: 1.0000 | ORAL_TABLET | Freq: Every day | ORAL | 11 refills | Status: DC

## 2023-08-15 ENCOUNTER — Encounter: Payer: Self-pay | Admitting: Oncology

## 2023-08-15 ENCOUNTER — Inpatient Hospital Stay: Attending: Oncology

## 2023-08-15 DIAGNOSIS — Z79899 Other long term (current) drug therapy: Secondary | ICD-10-CM | POA: Diagnosis not present

## 2023-08-15 DIAGNOSIS — D509 Iron deficiency anemia, unspecified: Secondary | ICD-10-CM | POA: Diagnosis not present

## 2023-08-15 DIAGNOSIS — D649 Anemia, unspecified: Secondary | ICD-10-CM

## 2023-08-15 DIAGNOSIS — E538 Deficiency of other specified B group vitamins: Secondary | ICD-10-CM | POA: Insufficient documentation

## 2023-08-15 DIAGNOSIS — Z7902 Long term (current) use of antithrombotics/antiplatelets: Secondary | ICD-10-CM | POA: Insufficient documentation

## 2023-08-15 DIAGNOSIS — Z7982 Long term (current) use of aspirin: Secondary | ICD-10-CM | POA: Insufficient documentation

## 2023-08-15 LAB — CBC WITH DIFFERENTIAL/PLATELET
Abs Immature Granulocytes: 0.04 10*3/uL (ref 0.00–0.07)
Basophils Absolute: 0 10*3/uL (ref 0.0–0.1)
Basophils Relative: 0 %
Eosinophils Absolute: 0 10*3/uL (ref 0.0–0.5)
Eosinophils Relative: 0 %
HCT: 43.1 % (ref 36.0–46.0)
Hemoglobin: 14.2 g/dL (ref 12.0–15.0)
Immature Granulocytes: 0 %
Lymphocytes Relative: 35 %
Lymphs Abs: 3.5 10*3/uL (ref 0.7–4.0)
MCH: 32.6 pg (ref 26.0–34.0)
MCHC: 32.9 g/dL (ref 30.0–36.0)
MCV: 99.1 fL (ref 80.0–100.0)
Monocytes Absolute: 0.7 10*3/uL (ref 0.1–1.0)
Monocytes Relative: 7 %
Neutro Abs: 5.8 10*3/uL (ref 1.7–7.7)
Neutrophils Relative %: 58 %
Platelets: 272 10*3/uL (ref 150–400)
RBC: 4.35 MIL/uL (ref 3.87–5.11)
RDW: 13.1 % (ref 11.5–15.5)
WBC: 10 10*3/uL (ref 4.0–10.5)
nRBC: 0 % (ref 0.0–0.2)

## 2023-08-15 LAB — COMPREHENSIVE METABOLIC PANEL WITH GFR
ALT: 58 U/L — ABNORMAL HIGH (ref 0–44)
AST: 38 U/L (ref 15–41)
Albumin: 3.6 g/dL (ref 3.5–5.0)
Alkaline Phosphatase: 58 U/L (ref 38–126)
Anion gap: 6 (ref 5–15)
BUN: 21 mg/dL (ref 8–23)
CO2: 23 mmol/L (ref 22–32)
Calcium: 9.5 mg/dL (ref 8.9–10.3)
Chloride: 109 mmol/L (ref 98–111)
Creatinine, Ser: 0.98 mg/dL (ref 0.44–1.00)
GFR, Estimated: 58 mL/min — ABNORMAL LOW (ref >60)
Glucose, Bld: 99 mg/dL (ref 70–99)
Potassium: 3 mmol/L — ABNORMAL LOW (ref 3.5–5.1)
Sodium: 138 mmol/L (ref 135–145)
Total Bilirubin: <0.2 mg/dL (ref 0.0–1.2)
Total Protein: 7.2 g/dL (ref 6.5–8.1)

## 2023-08-15 LAB — IRON AND TIBC
Iron: 100 ug/dL (ref 28–170)
Saturation Ratios: 26 % (ref 10.4–31.8)
TIBC: 388 ug/dL (ref 250–450)
UIBC: 288 ug/dL

## 2023-08-15 LAB — VITAMIN B12: Vitamin B-12: 429 pg/mL (ref 180–914)

## 2023-08-15 LAB — FOLATE: Folate: 11.9 ng/mL (ref >5.9)

## 2023-08-15 LAB — FERRITIN: Ferritin: 67 ng/mL (ref 11–307)

## 2023-08-16 ENCOUNTER — Ambulatory Visit: Admitting: Internal Medicine

## 2023-08-16 VITALS — BP 111/54 | HR 51 | Temp 97.8°F | Ht 66.0 in | Wt 115.6 lb

## 2023-08-16 DIAGNOSIS — H5316 Psychophysical visual disturbances: Secondary | ICD-10-CM | POA: Diagnosis not present

## 2023-08-16 DIAGNOSIS — F22 Delusional disorders: Secondary | ICD-10-CM | POA: Diagnosis not present

## 2023-08-16 DIAGNOSIS — I10 Essential (primary) hypertension: Secondary | ICD-10-CM | POA: Diagnosis not present

## 2023-08-16 DIAGNOSIS — R634 Abnormal weight loss: Secondary | ICD-10-CM | POA: Diagnosis not present

## 2023-08-16 DIAGNOSIS — F17210 Nicotine dependence, cigarettes, uncomplicated: Secondary | ICD-10-CM | POA: Diagnosis not present

## 2023-08-16 DIAGNOSIS — F17200 Nicotine dependence, unspecified, uncomplicated: Secondary | ICD-10-CM

## 2023-08-16 DIAGNOSIS — L309 Dermatitis, unspecified: Secondary | ICD-10-CM

## 2023-08-16 NOTE — Progress Notes (Signed)
 83 y.o. Jessica [name] is here for routine follow-up of chronic conditions, most concerning being gradual weight loss probably related to grief and living alone with inadequate caloric intake.  Generally feeling better.  She and her daughter [name] have been spending more time together, emphasizing emotional support and healthy meals. Has gained some weight! Better mood, no falls.  No new problems, continues to have visual hallucinations and sensation of parasites or bugs, usually in her scalp hair or eyebrows. She would like a dermatology referral, though she is aware of the likelihood that these are delusions.  Reducing smoking! 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   Iron  deficiency anemia due to chronic blood loss 12/28/2022   GI bleed hospitalized Fall 2024, no source found 12/27/2022   Nocturia more than twice per night 11/15/2022   Osteoarthritis of left hip 06/03/2021   Legal blindness 04/07/2021   Insomnia 10/20/2019   Opioid dependence with current use (HCC) 04/12/2018   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   Diastolic dysfunction 09/28/2017   Marijuana use 07/03/2016   Bilateral sensorineural hearing loss 06/11/2014   Subjective tinnitus of both ears 05/18/2014   Right rotator cuff tear 02/01/2013   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   Generalized anxiety disorder 04/05/2012   Mixed hyperlipidemia 04/05/2012   Hyperlipidemia with target low density lipoprotein (LDL) cholesterol less than 100 mg/dL 98/75/7985   Smoker 87/90/7986   History of small bowel obstruction 02/08/2012   PAD (peripheral artery disease) (HCC) 11/01/2011    Constipation due to pain medication 04/27/2010   Genital herpes 07/20/2006   Essential hypertension 07/20/2006   Lumbar degenerative disc disease 07/20/2006   Human immunodeficiency virus disease (HCC) 03/27/1986    Current Outpatient Medications:    alendronate  (FOSAMAX ) 70 MG tablet, Take 1 tablet (70 mg total) by mouth every Sunday. TAKE 1 TABLET BY MOUTH ONCE WEEKLY ON SUNDAY take with a full glass of water on an empty stomach Strength: 70 mg, Disp: 12 tablet, Rfl: 3   ALPRAZolam  (XANAX ) 0.5 MG tablet, Take 1 tablet (0.5 mg total) by mouth at bedtime as needed for sleep or anxiety., Disp: 30 tablet, Rfl: 3   aspirin  EC 81 MG tablet, Take 1 tablet (81 mg total) by mouth daily with breakfast., Disp: 90 tablet, Rfl: 3   atorvastatin  (LIPITOR) 10 MG tablet, Take 1 tablet (10 mg total) by mouth at bedtime., Disp: 90 tablet, Rfl: 3   benazepril  (LOTENSIN ) 20 MG tablet, Take 1 tablet (20 mg total) by mouth daily., Disp: 90 tablet, Rfl: 3   bictegravir-emtricitabine -tenofovir  AF (BIKTARVY ) 50-200-25 MG TABS tablet, Take 1 tablet by mouth daily., Disp: 30 tablet, Rfl: 11   brimonidine  (ALPHAGAN ) 0.2 % ophthalmic solution, 1 drop 3 (three) times daily., Disp: , Rfl:    clopidogrel  (PLAVIX ) 75 MG tablet, Take 1 tablet (75 mg total) by mouth daily., Disp: 90 tablet, Rfl: 3   cyanocobalamin  (VITAMIN B12) 1000 MCG tablet, Take 1 tablet (1,000 mcg total) by mouth daily., Disp: 30 tablet, Rfl: 2   diclofenac  Sodium (VOLTAREN ) 1 % GEL, Apply 2 g topically 4 (four) times daily. APPLY TWO GRAMS TO AFFECTED AREA(S) FOUR  TIMES DAILY Strength: 1 %, Disp: 350 g, Rfl: 2   DIPHENHYDRAMINE  HCL, TOPICAL, (BENADRYL  ITCH STOPPING) 2 % GEL, Apply 1 Application topically every 4 (four) hours as needed (symptoms of bugs on scalp)., Disp: 57 g, Rfl: 3   dorzolamide -timolol  (COSOPT ) 2-0.5 % ophthalmic solution, Place 1 drop into the right eye 2 (two) times daily., Disp: , Rfl:    ferrous sulfate  325 (65 FE) MG tablet, Take  1 tablet (325 mg total) by mouth daily with breakfast., Disp: , Rfl:    gabapentin  (NEURONTIN ) 300 MG capsule, Take 1 capsule (300 mg total) by mouth 3 (three) times daily., Disp: 270 capsule, Rfl: 3   mirtazapine  (REMERON ) 7.5 MG tablet, Take 1 tablet (7.5 mg total) by mouth at bedtime., Disp: 30 tablet, Rfl: 3   oxyCODONE -acetaminophen  (PERCOCET/ROXICET) 5-325 MG tablet, Take 1 tablet only for severe pain; as often as every 8 hours.  Use sparingly due to risk of falls., Disp: 60 tablet, Rfl: 0   polyethylene glycol (MIRALAX ) 17 g packet, Take 17 g by mouth daily., Disp: 90 packet, Rfl: 2   predniSONE  (DELTASONE ) 10 MG tablet, Take as directed per MD instructions, Disp: 21 tablet, Rfl: 0   senna (SENOKOT) 8.6 MG TABS tablet, Take 1 tablet (8.6 mg total) by mouth daily., Disp: 120 tablet, Rfl: 0   thiamine  (VITAMIN B1) 100 MG tablet, Take 1 tablet (100 mg total) by mouth daily., Disp: 90 tablet, Rfl: 3   valACYclovir  (VALTREX ) 1000 MG tablet, Take 1 tablet (1,000 mg total) by mouth 2 (two) times daily., Disp: 28 tablet, Rfl: 0  Functional Status:   Lives alone (sister was living with her until her death 15-May-2023); her daughter and care support, [name],  lives over the [place], about 40-45 minutes away.  [name] is legally blind.  She walks carefully using a cane or the walls/furniture to prevent falling; she has a bad back and chronically painful left hip.  She walks to the bathroom or uses a BSC when needed.  Sometimes she crawls from the bathroom back to bed if she is feeling dizzy.  She has recently acquired a shower chair.  She dresses and eats independently.  Is able to microwave prepared meals, and eats take-out meals brought by her son on occasion or food brought by [name].    Objective BP (!) 111/54 (BP Location: Left Arm, Patient Position: Sitting, Cuff Size: Small)   Pulse (!) 51   Temp 97.8 F (36.6 C) (Oral)   Ht 5' 6 (1.676 m)   Wt 115 lb 9.6 oz (52.4 kg)   SpO2 100%   BMI 18.66 kg/m    Exam: Smiling and much healthier appearing - the weight gain is visible - has perk and zest and good color which has been absent! She is very proud of her progress. Walks with limp favoring chronically painful L hip.  Heart RRR, no LE edema.  Skin turgor normal. No lesions or interlopers in the hair of scalp or eyebrows.   Problems addressed today:  Unintended weight loss Assessment & Plan: Gained a few pounds!  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  Her daughter, who lives out of town, has been very helpful with encouragement and taking her home with her for days at a time to ensure she is eating.    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  She is living with  them.   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  Scalp, hairline, forehead skin were carefully examined.  Skin is healthy with no lesions aside from excoriated areas.  The bumps which she perceives are normal hair follicles.  She does wish to see a dermatologist for added reassurance.  We discussed how these are most likely delusions, or tricks the mind plays.   -     Ambulatory referral to Dermatology  Essential hypertension Assessment & Plan: 111/54 on current regimen. No positional dizziness.  Continue.          No follow-ups on file.

## 2023-08-20 ENCOUNTER — Telehealth: Payer: Self-pay | Admitting: *Deleted

## 2023-08-20 MED ORDER — NICOTINE 14 MG/24HR TD PT24: 14.0000 mg | MEDICATED_PATCH | Freq: Every day | TRANSDERMAL | 2 refills | Status: DC

## 2023-08-20 NOTE — Telephone Encounter (Unsigned)
 Copied from CRM [id number]. Topic: Clinical - Medication Question >> Aug 20, 2023  9:14 AM Retta Caster wrote: Reason for CRM: Nicotine  patch-Patient daughter [name] needs call back update on prescription. [phone]

## 2023-08-22 ENCOUNTER — Inpatient Hospital Stay: Admitting: Oncology

## 2023-08-22 ENCOUNTER — Encounter: Payer: Self-pay | Admitting: Oncology

## 2023-08-23 ENCOUNTER — Telehealth: Payer: Self-pay | Admitting: *Deleted

## 2023-08-23 NOTE — Telephone Encounter (Signed)
 Will forward message to PCP. Copied from CRM [id number]. Topic: Clinical - Medication Question >> Aug 23, 2023  9:41 AM Shelby Dessert H wrote: Reason for CRM: Patients daughter called and stated that the patient ALPRAZolam  (XANAX ) 0.5 MG tablet is not strong enough and wants to know can she start back taking two? If so can she get another rx for it? Patients callback number is [phone].

## 2023-08-24 ENCOUNTER — Inpatient Hospital Stay (HOSPITAL_BASED_OUTPATIENT_CLINIC_OR_DEPARTMENT_OTHER): Admitting: Oncology

## 2023-08-24 VITALS — Wt 112.6 lb

## 2023-08-24 DIAGNOSIS — D500 Iron deficiency anemia secondary to blood loss (chronic): Secondary | ICD-10-CM

## 2023-08-24 DIAGNOSIS — Z79899 Other long term (current) drug therapy: Secondary | ICD-10-CM | POA: Diagnosis not present

## 2023-08-24 DIAGNOSIS — D509 Iron deficiency anemia, unspecified: Secondary | ICD-10-CM | POA: Diagnosis not present

## 2023-08-24 DIAGNOSIS — E538 Deficiency of other specified B group vitamins: Secondary | ICD-10-CM | POA: Diagnosis not present

## 2023-08-24 DIAGNOSIS — Z7902 Long term (current) use of antithrombotics/antiplatelets: Secondary | ICD-10-CM | POA: Diagnosis not present

## 2023-08-24 DIAGNOSIS — Z7982 Long term (current) use of aspirin: Secondary | ICD-10-CM | POA: Diagnosis not present

## 2023-08-24 NOTE — Assessment & Plan Note (Addendum)
 Hemoglobin previously at 5.8 with unclear etiology. No active bleeding or dark stools reported.   Endoscopy and colonoscopy did not show active signs of bleeding iron levels are very low, indicating possible iron deficiency anemia.   Iron deficiency improved significantly with IV iron. Anemia resolved at this time  -Can discontinue oral iron supplementation  No other hematological needs at this time.  Can follow-up with primary care for further care.  Recommended patient to reach out to us  with future concerns.

## 2023-08-24 NOTE — Assessment & Plan Note (Addendum)
 Vitamin B12 low today.  Likely secondary to malnutrition. Improved with parenteral and oral vitamin B12 supplementation  -Continue oral vitamin B12 supplementation  No further hematological needs at this time.  Please follow-up with primary care.

## 2023-08-24 NOTE — Progress Notes (Signed)
 [facility] at [facility]  HEMATOLOGY FOLLOW-UP VISIT  [name], MD  REASON FOR FOLLOW-UP: Iron deficiency anemia  ASSESSMENT & PLAN:  Patient is an 83 year old female following for iron deficiency anemia and vitamin B12 deficiency  Iron deficiency anemia due to chronic blood loss Hemoglobin previously at 5.8 with unclear etiology. No active bleeding or dark stools reported.   Endoscopy and colonoscopy did not show active signs of bleeding iron levels are very low, indicating possible iron deficiency anemia.   Iron deficiency improved significantly with IV iron. Anemia resolved at this time  -Can discontinue oral iron supplementation  No other hematological needs at this time.  Can follow-up with primary care for further care.  Recommended patient to reach out to us  with future concerns.  Vitamin B12 deficiency Vitamin B12 low today.  Likely secondary to malnutrition. Improved with parenteral and oral vitamin B12 supplementation  -Continue oral vitamin B12 supplementation  No further hematological needs at this time.  Please follow-up with primary care.   No orders of the defined types were placed in this encounter.   The total time spent in the appointment was 20 minutes encounter with patients including review of chart and various tests results, discussions about plan of care and coordination of care plan   All questions were answered. The patient knows to call the clinic with any problems, questions or concerns. No barriers to learning was detected.  Jessica Grade, MD 6/13/20252:27 PM   SUMMARY OF HEMATOLOGIC HISTORY: Patient with significant iron deficiency -12/31/2022: Endoscopy and colonoscopy: No signs of active bleeding seen -S/p IV Feraheme into 2 doses on 04/13/2023 and 04/20/2023 -Low vitamin B12 levels.  Received 4 doses of parenteral vitamin B12 weekly  INTERVAL HISTORY: Jessica [name] Manns 83 y.o. female following  for anemia.  She is accompanied by her daughter today.  Reports no complaints and is significantly feeling better.She has been feeling stronger and has been making an effort to finish her meals.   I have reviewed the past medical history, past surgical history, social history and family history with the patient   ALLERGIES:  is allergic to hctz [hydrochlorothiazide ].  MEDICATIONS:  Current Outpatient Medications  Medication Sig Dispense Refill   acyclovir  (ZOVIRAX ) 400 MG tablet Take 400 mg by mouth 3 (three) times daily as needed.     alendronate  (FOSAMAX ) 70 MG tablet Take 1 tablet (70 mg total) by mouth every Sunday. TAKE 1 TABLET BY MOUTH ONCE WEEKLY ON SUNDAY take with a full glass of water on an empty stomach Strength: 70 mg 12 tablet 3   ALPRAZolam  (XANAX ) 0.5 MG tablet Take 1 tablet (0.5 mg total) by mouth at bedtime as needed for sleep or anxiety. 30 tablet 3   amLODipine  (NORVASC ) 10 MG tablet Take 10 mg by mouth daily.     aspirin  EC 81 MG tablet Take 1 tablet (81 mg total) by mouth daily with breakfast. 90 tablet 3   atorvastatin  (LIPITOR) 10 MG tablet Take 1 tablet (10 mg total) by mouth at bedtime. 90 tablet 3   benazepril  (LOTENSIN ) 40 MG tablet Take 40 mg by mouth daily.     bictegravir-emtricitabine -tenofovir  AF (BIKTARVY ) 50-200-25 MG TABS tablet Take 1 tablet by mouth daily. 30 tablet 11   brimonidine  (ALPHAGAN ) 0.2 % ophthalmic solution 1 drop 3 (three) times daily.     clopidogrel  (PLAVIX ) 75 MG tablet Take 1 tablet (75 mg total) by mouth daily. 90 tablet 3   cyanocobalamin  (VITAMIN  B12) 1000 MCG tablet Take 1 tablet (1,000 mcg total) by mouth daily. 30 tablet 2   diclofenac  Sodium (VOLTAREN ) 1 % GEL Apply 2 g topically 4 (four) times daily. APPLY TWO GRAMS TO AFFECTED AREA(S) FOUR TIMES DAILY Strength: 1 % 350 g 2   DIPHENHYDRAMINE  HCL, TOPICAL, (BENADRYL  ITCH STOPPING) 2 % GEL Apply 1 Application topically every 4 (four) hours as needed (symptoms of bugs on scalp). 57 g 3    dorzolamide -timolol  (COSOPT ) 2-0.5 % ophthalmic solution Place 1 drop into the right eye 2 (two) times daily.     ferrous sulfate  325 (65 FE) MG tablet Take 1 tablet (325 mg total) by mouth daily with breakfast.     gabapentin  (NEURONTIN ) 300 MG capsule Take 1 capsule (300 mg total) by mouth 3 (three) times daily. 270 capsule 3   latanoprost  (XALATAN ) 0.005 % ophthalmic solution 1 drop at bedtime.     mirtazapine  (REMERON ) 7.5 MG tablet Take 1 tablet (7.5 mg total) by mouth at bedtime. 30 tablet 3   nicotine  (NICODERM CQ  - DOSED IN MG/24 HOURS) 14 mg/24hr patch Place 1 patch (14 mg total) onto the skin daily. 14 patch 2   oxyCODONE -acetaminophen  (PERCOCET/ROXICET) 5-325 MG tablet Take 1 tablet only for severe pain; as often as every 8 hours.  Use sparingly due to risk of falls. 60 tablet 0   polyethylene glycol (MIRALAX ) 17 g packet Take 17 g by mouth daily. 90 packet 2   senna (SENOKOT) 8.6 MG TABS tablet Take 1 tablet (8.6 mg total) by mouth daily. 120 tablet 0   thiamine  (VITAMIN B1) 100 MG tablet Take 1 tablet (100 mg total) by mouth daily. 90 tablet 3   valACYclovir  (VALTREX ) 1000 MG tablet Take 1 tablet (1,000 mg total) by mouth 2 (two) times daily. 28 tablet 0   predniSONE  (DELTASONE ) 10 MG tablet Take as directed per MD instructions 21 tablet 0   No current facility-administered medications for this visit.     REVIEW OF SYSTEMS:   Constitutional: Denies fevers, chills or night sweats Eyes: Denies blurriness of vision Ears, nose, mouth, throat, and face: Denies mucositis or sore throat Respiratory: Denies cough, dyspnea or wheezes Cardiovascular: Denies palpitation, chest discomfort or lower extremity swelling Gastrointestinal:  Denies nausea, heartburn or change in bowel habits Skin: Denies abnormal skin rashes Lymphatics: Denies new lymphadenopathy or easy bruising Neurological:Denies numbness, tingling or new weaknesses Behavioral/Psych: Mood is stable, no new changes  All  other systems were reviewed with the patient and are negative.  PHYSICAL EXAMINATION: Vitals: Blood pressure: 107 over 88 mmHg Pulse: 54 bpm RR: 16 Temp: 97.9 Oxygen  saturation: 100%  GENERAL:alert, no distress and comfortable LUNGS: clear to auscultation and percussion with normal breathing effort HEART: regular rate & rhythm and no murmurs and no lower extremity edema ABDOMEN:abdomen soft, non-tender and normal bowel sounds Musculoskeletal:no cyanosis of digits and no clubbing  NEURO: alert & oriented x 3 with fluent speech  LABORATORY DATA:  I have reviewed the data as listed  Lab Results  Component Value Date   WBC 10.0 08/15/2023   NEUTROABS 5.8 08/15/2023   HGB 14.2 08/15/2023   HCT 43.1 08/15/2023   MCV 99.1 08/15/2023   PLT 272 08/15/2023      Chemistry      Component Value Date/Time   NA 138 08/15/2023 1131   NA 142 12/30/2021 0946   K 3.0 (L) 08/15/2023 1131   CL 109 08/15/2023 1131   CO2 23 08/15/2023 1131  BUN 21 08/15/2023 1131   BUN 14 12/30/2021 0946   CREATININE 0.98 08/15/2023 1131   CREATININE 0.97 (H) 06/05/2023 0958      Component Value Date/Time   CALCIUM  9.5 08/15/2023 1131   ALKPHOS 58 08/15/2023 1131   AST 38 08/15/2023 1131   ALT 58 (H) 08/15/2023 1131   BILITOT <0.2 08/15/2023 1131   BILITOT 0.3 03/16/2017 1006      Latest Reference Range & Units 08/15/23 11:31  Iron 28 - 170 ug/dL 604  UIBC ug/dL 540  TIBC 981 - 191 ug/dL 478  Saturation Ratios 10.4 - 31.8 % 26  Ferritin 11 - 307 ng/mL 67  Folate >5.9 ng/mL 11.9  Vitamin B12 180 - 914 pg/mL 429

## 2023-09-03 ENCOUNTER — Other Ambulatory Visit: Payer: Self-pay

## 2023-09-03 DIAGNOSIS — M1612 Unilateral primary osteoarthritis, left hip: Secondary | ICD-10-CM

## 2023-09-05 MED ORDER — OXYCODONE-ACETAMINOPHEN 5-325 MG PO TABS: ORAL_TABLET | ORAL | 0 refills | Status: DC

## 2023-09-24 ENCOUNTER — Other Ambulatory Visit: Payer: Self-pay | Admitting: Internal Medicine

## 2023-09-24 DIAGNOSIS — M25552 Pain in left hip: Secondary | ICD-10-CM

## 2023-09-25 ENCOUNTER — Ambulatory Visit: Payer: Self-pay

## 2023-09-25 ENCOUNTER — Other Ambulatory Visit: Payer: Self-pay

## 2023-09-25 DIAGNOSIS — R634 Abnormal weight loss: Secondary | ICD-10-CM

## 2023-09-25 DIAGNOSIS — F5102 Adjustment insomnia: Secondary | ICD-10-CM

## 2023-09-25 DIAGNOSIS — F4321 Adjustment disorder with depressed mood: Secondary | ICD-10-CM

## 2023-09-25 MED ORDER — MIRTAZAPINE 7.5 MG PO TABS: 7.5000 mg | ORAL_TABLET | Freq: Every day | ORAL | 3 refills | Status: DC

## 2023-09-25 NOTE — Telephone Encounter (Signed)
 Called pt who stated the CP started 6/30; non-radiating but sometimes her shoulder hurts. Denies sob. Stated the pain comes and goes.; no c/o CP at this times I'm having a pretty good day now; she had some pain early this am.  Advised pt to go to the ER to be evaluated; stated she was not going and she already has an appt here on Thursday. Advised pt if her symptoms worsen to go to the ER - stated she understands.

## 2023-09-25 NOTE — Telephone Encounter (Signed)
 FYI Only or Action Required?: FYI only for provider.  Patient was last seen in primary care on 08/16/2023 by Trudy Mliss Dragon, MD.  Called Nurse Triage reporting Chest Pain.  Symptoms began several weeks ago.  Interventions attempted: Nothing.  Symptoms are: unchanged.  Triage Disposition: See PCP When Office is Open (Within 3 Days)  Patient/caregiver understands and will follow disposition?: Yes   Copied from CRM [id number]. Topic: Clinical - Red Word Triage >> Sep 25, 2023  2:43 PM Jessica [name] wrote: Red Word that prompted transfer to Nurse Triage: LT Thigh /LT upper Chest pain since 06/30 Reason for Disposition  [1] Chest pain from known angina comes and goes AND [2] is NOT happening more often (increasing in frequency) or getting worse (increasing in severity)  Answer Assessment - Initial Assessment Questions 1. LOCATION: Where does it hurt?       Left chest pain 2. RADIATION: Does the pain go anywhere else? (e.g., into neck, jaw, arms, back)     Up into left shoulder 3. ONSET: When did the chest pain begin? (Minutes, hours or days)      09/10/2023 4. PATTERN: Does the pain come and go, or has it been constant since it started?  Does it get worse with exertion?      Comes and goes 5. DURATION: How long does it last (e.g., seconds, minutes, hours)     Sometimes a few hours, some times for a day 6. SEVERITY: How bad is the pain?  (e.g., Scale 1-10; mild, moderate, or severe)     At worse, 7/10 7. CARDIAC RISK FACTORS: Do you have any history of heart problems or risk factors for heart disease? (e.g., angina, prior heart attack; diabetes, high blood pressure, high cholesterol, smoker, or strong family history of heart disease)     Hypertension, smoker, aortic bypass a few years ago 8. PULMONARY RISK FACTORS: Do you have any history of lung disease?  (e.g., blood clots in lung, asthma, emphysema, birth control pills)     Denies all except for blood clot in leg 9.  CAUSE: What do you think is causing the chest pain?     unknown 10. OTHER SYMPTOMS: Do you have any other symptoms? (e.g., dizziness, nausea, vomiting, sweating, fever, difficulty breathing, cough)       Night sweats and labored breathing at times, but not associated with the chest pain  Protocols used: Chest Pain-A-AH

## 2023-09-27 ENCOUNTER — Encounter (HOSPITAL_COMMUNITY): Payer: Self-pay

## 2023-09-27 ENCOUNTER — Other Ambulatory Visit: Payer: Self-pay

## 2023-09-27 ENCOUNTER — Ambulatory Visit: Admitting: Student

## 2023-09-27 ENCOUNTER — Emergency Department (HOSPITAL_COMMUNITY)
Admission: EM | Admit: 2023-09-27 | Discharge: 2023-09-27 | Discharge status: Against Medical Advice | Source: Ambulatory Visit | Attending: Emergency Medicine | Admitting: Emergency Medicine

## 2023-09-27 ENCOUNTER — Emergency Department (HOSPITAL_COMMUNITY)

## 2023-09-27 VITALS — BP 91/60 | HR 74 | Temp 97.7°F | Ht 66.0 in | Wt 122.2 lb

## 2023-09-27 DIAGNOSIS — F17200 Nicotine dependence, unspecified, uncomplicated: Secondary | ICD-10-CM

## 2023-09-27 DIAGNOSIS — R079 Chest pain, unspecified: Secondary | ICD-10-CM | POA: Diagnosis not present

## 2023-09-27 DIAGNOSIS — R0789 Other chest pain: Secondary | ICD-10-CM | POA: Insufficient documentation

## 2023-09-27 DIAGNOSIS — Z5321 Procedure and treatment not carried out due to patient leaving prior to being seen by health care provider: Secondary | ICD-10-CM | POA: Diagnosis not present

## 2023-09-27 DIAGNOSIS — F17210 Nicotine dependence, cigarettes, uncomplicated: Secondary | ICD-10-CM

## 2023-09-27 DIAGNOSIS — R918 Other nonspecific abnormal finding of lung field: Secondary | ICD-10-CM | POA: Diagnosis not present

## 2023-09-27 LAB — CBC
HCT: 40.1 % (ref 36.0–46.0)
Hemoglobin: 13.1 g/dL (ref 12.0–15.0)
MCH: 32.7 pg (ref 26.0–34.0)
MCHC: 32.7 g/dL (ref 30.0–36.0)
MCV: 100 fL (ref 80.0–100.0)
Platelets: 275 K/uL (ref 150–400)
RBC: 4.01 MIL/uL (ref 3.87–5.11)
RDW: 13.8 % (ref 11.5–15.5)
WBC: 7.8 K/uL (ref 4.0–10.5)
nRBC: 0 % (ref 0.0–0.2)

## 2023-09-27 LAB — BASIC METABOLIC PANEL WITH GFR
Anion gap: 9 (ref 5–15)
BUN: 17 mg/dL (ref 8–23)
CO2: 19 mmol/L — ABNORMAL LOW (ref 22–32)
Calcium: 9.3 mg/dL (ref 8.9–10.3)
Chloride: 110 mmol/L (ref 98–111)
Creatinine, Ser: 0.86 mg/dL (ref 0.44–1.00)
GFR, Estimated: >60 mL/min (ref >60)
Glucose, Bld: 110 mg/dL — ABNORMAL HIGH (ref 70–99)
Potassium: 3.4 mmol/L — ABNORMAL LOW (ref 3.5–5.1)
Sodium: 138 mmol/L (ref 135–145)

## 2023-09-27 LAB — TROPONIN I (HIGH SENSITIVITY)
Troponin I (High Sensitivity): 6 ng/L (ref <18)
Troponin I (High Sensitivity): 7 ng/L (ref <18)

## 2023-09-27 MED ORDER — NICOTINE 14 MG/24HR TD PT24: 14.0000 mg | MEDICATED_PATCH | Freq: Every day | TRANSDERMAL | 2 refills | Status: AC

## 2023-09-27 NOTE — ED Triage Notes (Signed)
 Reports chest pain that started 6/30 and radiates to her left arm.  Denies any other symptoms.

## 2023-09-27 NOTE — Assessment & Plan Note (Addendum)
 Jessica [name], an 83 year old wheelchair-bound female with a history of peripheral artery disease and hypertension, presented with concern for chest pain lasting two weeks. She describes the pain as midsternal, throbbing in nature, rated 8/10, and radiating to the left shoulder. The pain is exacerbated by activity and has remained unchanged in intensity since onset. She denies associated symptoms such as diaphoresis, nausea, fever, chills, or dyspnea, though she reports an intermittent dry cough. The pain is not reproducible on exam. Given her age, medical history, and symptom characteristics, referral to the emergency department is appropriate for further evaluation to rule out potentially life-threatening cardiac or pulmonary etiologies such as ACS. - Refer to ED for further workup

## 2023-09-27 NOTE — ED Provider Triage Note (Signed)
 Emergency Medicine Provider Triage Evaluation Note  Wilbur Labuda , a 83 y.o. female  was evaluated in triage.  Pt complains of cp. Recurrent L sided cp radiates to L arm x 2 weeks.  No fever, chills, cough, sob, nausea, lighthead or dizzy.  Seen by PCP and sent here for further eval  Review of Systems  Positive: As above Negative: As above  Physical Exam  BP 127/70 (BP Location: Right Arm)   Pulse 62   Temp 98.1 F (36.7 C)   Resp 16   Ht 5' 6 (1.676 m)   Wt 55.3 kg   SpO2 95%   BMI 19.69 kg/m  Gen:   Awake, no distress   Resp:  Normal effort  MSK:   Moves extremities without difficulty  Other:    Medical Decision Making  Medically screening exam initiated at 4:26 PM.  Appropriate orders placed.  Lauraine MALVA Donnell Elmer was informed that the remainder of the evaluation will be completed by another provider, this initial triage assessment does not replace that evaluation, and the importance of remaining in the ED until their evaluation is complete.     Nivia Colon, PA-C 09/27/23 1627

## 2023-09-27 NOTE — ED Notes (Signed)
 Patient left.

## 2023-09-27 NOTE — Progress Notes (Signed)
 CC: Chest pain   HPI:  Jessica [name] is a 83 y.o. female living with a history stated below and presents today for chest pain . Please see problem based assessment and plan for additional details.  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  Hearing aides with possible masking of tinnitus recommended but patient wished to defer secondary to finances.   Blood transfusion without reported diagnosis    pt denies   Bursitis of right shoulder 07/12/2012   s/p shoulder injection 07/12/2012    Cataract of right eye    REMOVED RIGHT EYE 4-19    Constipation due to pain medication 04/27/2010   Diverticulosis 02/08/2012   Extensive left-sided diverticula on colonoscopy March 2012 per Dr. Shaaron    Diverticulosis of colon without hemorrhage 02/08/2012   Extensive left-sided diverticula on colonoscopy March 2012 per Dr. 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   Headache 10/20/2019   Heart murmur 1961   History of vitamin D  deficiency 05/29/2018   Vitamin D  18.96 (04/30/2018), treated with ergocalciferol  50,000 units PO QWk X 4 weeks     Human immunodeficiency virus disease (HCC) 03/27/1986   Hyperlipidemia LDL goal < 100 04/05/2012   Hypokalemia 04/12/2018   Insomnia 10/20/2019   Iron deficiency anemia due to chronic blood loss 04/04/2023   Left hip pain 04/11/2021   Leg pain 04/11/2021   Long-term current use of opiate analgesic 03/17/2016   Loose stools 04/08/2019   Lumbar degenerative disc disease 07/20/2006   With chronic back pain    Marijuana use 07/03/2016   Micturition syncope 09/20/2015    Nausea and vomiting 04/08/2019   Opiate dependence (HCC) 04/12/2018   Opioid dependence with current use (HCC) 04/12/2018   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   s/p aortobifem bypass 2009    Periumbilical hernia 05/18/2014   1 cm left periumbilical abdominal wall defect   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   DEXA 04/15/2012: L1-L4 spine T -3.9, Right femur T -3.0    Right rotator cuff tear 02/01/2013   Responds to periodic steroid injections   Seborrhea 09/01/2010   Shoulder pain, right 12/04/2017   Small bowel obstruction due to adhesions (HCC) 02/08/2012   s/p Exploratory laparotomy, lysis of adhesions 02/12/12     Subjective tinnitus of both ears 05/18/2014   Tobacco abuse 02/19/2012   Tobacco abuse    Vasovagal syncope 02/15/2015   Visual hallucination 12/04/2022   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     alendronate  (FOSAMAX ) 70 MG tablet Take 1 tablet (70 mg total) by mouth every Sunday. TAKE 1 TABLET BY MOUTH ONCE WEEKLY ON SUNDAY take with a full glass  of water on an empty stomach Strength: 70 mg 12 tablet 3   ALPRAZolam  (XANAX ) 0.5 MG tablet Take 1 tablet (0.5 mg total) by mouth at bedtime as needed for sleep or anxiety. 30 tablet 3   amLODipine  (NORVASC ) 10 MG tablet Take 10 mg by mouth daily.     aspirin  EC 81 MG tablet Take 1 tablet (81 mg total) by mouth daily with breakfast. 90 tablet 3   atorvastatin  (LIPITOR) 10 MG tablet Take 1 tablet (10 mg total) by mouth at bedtime. 90 tablet 3   benazepril  (LOTENSIN ) 40 MG tablet Take 40 mg by mouth daily.      bictegravir-emtricitabine -tenofovir  AF (BIKTARVY ) 50-200-25 MG TABS tablet Take 1 tablet by mouth daily. 30 tablet 11   brimonidine  (ALPHAGAN ) 0.2 % ophthalmic solution 1 drop 3 (three) times daily.     clopidogrel  (PLAVIX ) 75 MG tablet Take 1 tablet (75 mg total) by mouth daily. 90 tablet 3   cyanocobalamin  (VITAMIN B12) 1000 MCG tablet Take 1 tablet (1,000 mcg total) by mouth daily. 30 tablet 2   diclofenac  Sodium (VOLTAREN ) 1 % GEL Apply 2 g topically 4 (four) times daily. APPLY TWO GRAMS TO AFFECTED AREA(S) FOUR TIMES DAILY Strength: 1 % 350 g 2   DIPHENHYDRAMINE  HCL, TOPICAL, (BENADRYL  ITCH STOPPING) 2 % GEL Apply 1 Application topically every 4 (four) hours as needed (symptoms of bugs on scalp). 57 g 3   dorzolamide -timolol  (COSOPT ) 2-0.5 % ophthalmic solution Place 1 drop into the right eye 2 (two) times daily.     ferrous sulfate  325 (65 FE) MG tablet Take 1 tablet (325 mg total) by mouth daily with breakfast.     gabapentin  (NEURONTIN ) 300 MG capsule TAKE ONE CAPSULE BY MOUTH THREE TIMES DAILY (DOSE decrease) 270 capsule 3   latanoprost  (XALATAN ) 0.005 % ophthalmic solution 1 drop at bedtime.     mirtazapine  (REMERON ) 7.5 MG tablet Take 1 tablet (7.5 mg total) by mouth at bedtime. 30 tablet 3   oxyCODONE -acetaminophen  (PERCOCET/ROXICET) 5-325 MG tablet Take 1 tablet only for severe pain; as often as every 8 hours.  Use sparingly due to risk of falls. 60 tablet 0   polyethylene glycol (MIRALAX ) 17 g packet Take 17 g by mouth daily. 90 packet 2   predniSONE  (DELTASONE ) 10 MG tablet Take as directed per MD instructions 21 tablet 0   senna (SENOKOT) 8.6 MG TABS tablet Take 1 tablet (8.6 mg total) by mouth daily. 120 tablet 0   thiamine  (VITAMIN B1) 100 MG tablet Take 1 tablet (100 mg total) by mouth daily. 90 tablet 3   valACYclovir  (VALTREX ) 1000 MG tablet Take 1 tablet (1,000 mg total) by mouth 2 (two) times daily. 28 tablet 0   No current facility-administered medications on file prior  to visit.    Family History  Problem Relation Age of Onset   Kidney failure Mother    Diabetes Mother    Hypertension Mother    Heart disease Mother    Glaucoma Father    Congestive Heart Failure Sister    Diabetes Sister    Kidney disease Sister    Diabetes Brother    Unexplained death Brother 56       Automobile accident   Hypothyroidism Daughter    Arthritis Daughter        Neck/Back   Healthy Son    HIV/AIDS Brother    HIV Daughter    Kidney disease Daughter    Arthritis Son  Knee   Colon cancer Neg Hx    Colon polyps Neg Hx    Esophageal cancer Neg Hx    Rectal cancer Neg Hx    Stomach cancer Neg Hx     Social History   Socioeconomic History   Marital status: Widowed    Spouse name: Not on file   Number of children: 4   Years of education: 2y college   Highest education level: Not on file  Occupational History   Occupation: retired    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   Tobacco Use   Smoking status: Every Day    Current packs/day: 0.30    Average packs/day: 0.3 packs/day for 50.0 years (15.0 ttl pk-yrs)    Types: Cigarettes    Passive exposure: Current   Smokeless tobacco: Never   Tobacco comments:    5 cigs per day  Vaping Use   Vaping status: Never Used  Substance and Sexual Activity   Alcohol use: No    Alcohol/week: 0.0 standard drinks of alcohol    Comment: last drink of alcohol ~ 1977   Drug use: No   Sexual activity: Never  Other Topics Concern   Not on file  Social History Narrative   Lives alone in [place], [place]   Social Drivers of Health   Financial Resource Strain: Low Risk  (08/16/2022)   Overall Financial Resource Strain (CARDIA)    Difficulty of Paying Living Expenses: Not hard at all  Food Insecurity: No Food Insecurity (12/28/2022)   Hunger Vital Sign    Worried About Running Out of Food in the Last Year: Never true    Ran Out of Food in the Last Year: Never true  Transportation Needs: No  Transportation Needs (12/28/2022)   PRAPARE - Administrator, Civil Service (Medical): No    Lack of Transportation (Non-Medical): No  Physical Activity: Insufficiently Active (08/16/2022)   Exercise Vital Sign    Days of Exercise per Week: 1 day    Minutes of Exercise per Session: 10 min  Stress: Stress Concern Present (08/16/2022)   [employer] of Occupational Health - Occupational Stress Questionnaire    Feeling of Stress : To some extent  Social Connections: Moderately Isolated (08/16/2022)   Social Connection and Isolation Panel    Frequency of Communication with Friends and Family: More than three times a week    Frequency of Social Gatherings with Friends and Family: More than three times a week    Attends Religious Services: More than 4 times per year    Active Member of [employer] or Organizations: No    Attends Banker Meetings: Never    Marital Status: Widowed  Intimate Partner Violence: Not At Risk (12/28/2022)   Humiliation, Afraid, Rape, and Kick questionnaire    Fear of Current or Ex-Partner: No    Emotionally Abused: No    Physically Abused: No    Sexually Abused: No    Review of Systems: ROS negative except for what is noted on the assessment and plan.  Vitals:   09/27/23 1444 09/27/23 1445  BP: 99/64 91/60  Pulse: 74 74  Temp: 97.7 F (36.5 C)   TempSrc: Oral   SpO2: 92%   Weight: 122 lb 3.2 oz (55.4 kg)   Height: 5' 6 (1.676 m)     Physical Exam: Constitutional: Chronically ill-appearing woman, sitting in a wheelchair in no acute distress Cardiovascular: regular rate and rhythm.  No signs of  trauma to the chest Pulmonary/Chest: normal work of breathing on room air, Neurological: alert & oriented x 3, no focal deficit Skin: warm and dry Psych: normal mood and behavior  Assessment & Plan:   Chest pain in adult Ms. Donnell, an 83 year old wheelchair-bound female with a history of peripheral artery disease and hypertension,  presented with concern for chest pain lasting two weeks. She describes the pain as midsternal, throbbing in nature, rated 8/10, and radiating to the left shoulder. The pain is exacerbated by activity and has remained unchanged in intensity since onset. She denies associated symptoms such as diaphoresis, nausea, fever, chills, or dyspnea, though she reports an intermittent dry cough. The pain is not reproducible on exam. Given her age, medical history, and symptom characteristics, referral to the emergency department is appropriate for further evaluation to rule out potentially life-threatening cardiac or pulmonary etiologies such as ACS. - Refer to ED for further workup     Patient discussed with Dr. Narendra    Marget Outten, M.D [facility] Phone: [phone] Date 09/27/2023 Time 4:40 PM

## 2023-09-27 NOTE — Patient Instructions (Addendum)
 Thank you, Ms.Lauraine MALVA Donnell Elmer for allowing us  to provide your care today. Today we discussed your chest pain.  Based on your symptoms symptoms and your risk factors I think is reasonable for you to go to the emergency room and have you further evaluated.  NB: Please have this patient evaluated emergently  I have ordered the following labs for you:  Lab Orders  No laboratory test(s) ordered today     Tests ordered today:    Referrals ordered today:   Referral Orders  No referral(s) requested today     I have ordered the following medication/changed the following medications:   Stop the following medications: There are no discontinued medications.   Start the following medications: No orders of the defined types were placed in this encounter.    Follow up:     Remember:   Should you have any questions or concerns please call the internal medicine clinic at [phone].   Drue Lisa Grow MD 09/27/2023, 3:14 PM   [facility]

## 2023-09-30 NOTE — Progress Notes (Signed)
 Internal Medicine Clinic Attending  Case discussed with the resident at the time of the visit.  We reviewed the resident's history and exam and pertinent patient test results.  I agree with the assessment, diagnosis, and plan of care documented in the resident's note.

## 2023-10-03 ENCOUNTER — Other Ambulatory Visit: Payer: Self-pay

## 2023-10-04 NOTE — Telephone Encounter (Signed)
 This is prescribed by Dr. Lindia, ID

## 2023-10-08 ENCOUNTER — Other Ambulatory Visit: Payer: Self-pay

## 2023-10-08 DIAGNOSIS — M1612 Unilateral primary osteoarthritis, left hip: Secondary | ICD-10-CM

## 2023-10-08 MED ORDER — OXYCODONE-ACETAMINOPHEN 5-325 MG PO TABS: ORAL_TABLET | ORAL | 0 refills | Status: DC

## 2023-10-15 NOTE — Assessment & Plan Note (Signed)
 Recent visit with her ID doctor who treated complaints about lesions with valacylovir.  Scalp, hairline, forehead skin were carefully examined.  Skin is healthy with no lesions aside from excoriated areas.  The bumps which she perceives are normal hair follicles.  She does wish to see a dermatologist for added reassurance.  We discussed how these are most likely delusions, or tricks the mind plays.

## 2023-10-15 NOTE — Assessment & Plan Note (Signed)
 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  She is living with them.

## 2023-10-15 NOTE — Assessment & Plan Note (Signed)
 Gained a few pounds!  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  Her daughter, who lives out of town, has been very helpful with encouragement and taking her home with her for days at a time to ensure she is eating.

## 2023-10-23 ENCOUNTER — Encounter (HOSPITAL_COMMUNITY): Payer: Self-pay

## 2023-10-23 ENCOUNTER — Ambulatory Visit (HOSPITAL_COMMUNITY)
Admission: RE | Admit: 2023-10-23 | Discharge: 2023-10-23 | Disposition: A | Discharge status: Home / Self Care | Source: Ambulatory Visit | Attending: Internal Medicine | Admitting: Internal Medicine

## 2023-10-23 DIAGNOSIS — F17200 Nicotine dependence, unspecified, uncomplicated: Secondary | ICD-10-CM | POA: Diagnosis not present

## 2023-10-23 DIAGNOSIS — R911 Solitary pulmonary nodule: Secondary | ICD-10-CM | POA: Insufficient documentation

## 2023-10-23 DIAGNOSIS — J432 Centrilobular emphysema: Secondary | ICD-10-CM | POA: Diagnosis not present

## 2023-10-23 DIAGNOSIS — I7121 Aneurysm of the ascending aorta, without rupture: Secondary | ICD-10-CM | POA: Diagnosis not present

## 2023-10-30 ENCOUNTER — Other Ambulatory Visit: Payer: Self-pay | Admitting: Internal Medicine

## 2023-10-30 ENCOUNTER — Encounter: Payer: Self-pay | Admitting: *Deleted

## 2023-10-30 DIAGNOSIS — M1612 Unilateral primary osteoarthritis, left hip: Secondary | ICD-10-CM

## 2023-10-30 NOTE — Telephone Encounter (Signed)
 Copied from CRM #[id number]. Topic: Clinical - Medication Refill >> Oct 30, 2023 11:17 AM Merlynn A wrote: Medication: oxyCODONE -acetaminophen  (PERCOCET/ROXICET) 5-325 MG tablet  Has the patient contacted their pharmacy? Yes (Agent: If no, request that the patient contact the pharmacy for the refill. If patient does not wish to contact the pharmacy document the reason why and proceed with request.) (Agent: If yes, when and what did the pharmacy advise?)  This is the patient's preferred pharmacy:  [facility] - [place], [place] - [address] [place] [address] Phone: [phone] Fax: [phone]  Is this the correct pharmacy for this prescription? Yes If no, delete pharmacy and type the correct one.   Has the prescription been filled recently? No  Is the patient out of the medication? Yes  Has the patient been seen for an appointment in the last year OR does the patient have an upcoming appointment? Yes  Can we respond through MyChart? Yes  Agent: Please be advised that Rx refills may take up to 3 business days. We ask that you follow-up with your pharmacy.

## 2023-10-30 NOTE — Telephone Encounter (Signed)
 Last rx written - 10/08/23. Last OV - 09/27/23. Next OV -11/22/23. TOX - 01/04/18.

## 2023-10-31 MED ORDER — OXYCODONE-ACETAMINOPHEN 5-325 MG PO TABS: ORAL_TABLET | ORAL | 0 refills | Status: DC

## 2023-11-02 ENCOUNTER — Other Ambulatory Visit: Payer: Self-pay

## 2023-11-02 ENCOUNTER — Observation Stay (HOSPITAL_COMMUNITY)
Admission: EM | Admit: 2023-11-02 | Discharge: 2023-11-03 | Disposition: A | Discharge status: Home / Self Care | Attending: Gastroenterology | Admitting: Gastroenterology

## 2023-11-02 DIAGNOSIS — R531 Weakness: Secondary | ICD-10-CM | POA: Diagnosis not present

## 2023-11-02 DIAGNOSIS — I10 Essential (primary) hypertension: Secondary | ICD-10-CM | POA: Diagnosis present

## 2023-11-02 DIAGNOSIS — E876 Hypokalemia: Secondary | ICD-10-CM | POA: Diagnosis present

## 2023-11-02 DIAGNOSIS — B9681 Helicobacter pylori [H. pylori] as the cause of diseases classified elsewhere: Secondary | ICD-10-CM | POA: Diagnosis not present

## 2023-11-02 DIAGNOSIS — K2950 Unspecified chronic gastritis without bleeding: Principal | ICD-10-CM | POA: Insufficient documentation

## 2023-11-02 DIAGNOSIS — Z79899 Other long term (current) drug therapy: Secondary | ICD-10-CM | POA: Insufficient documentation

## 2023-11-02 DIAGNOSIS — B20 Human immunodeficiency virus [HIV] disease: Secondary | ICD-10-CM | POA: Diagnosis present

## 2023-11-02 DIAGNOSIS — K921 Melena: Secondary | ICD-10-CM | POA: Insufficient documentation

## 2023-11-02 DIAGNOSIS — K922 Gastrointestinal hemorrhage, unspecified: Principal | ICD-10-CM | POA: Diagnosis present

## 2023-11-02 DIAGNOSIS — R195 Other fecal abnormalities: Secondary | ICD-10-CM | POA: Insufficient documentation

## 2023-11-02 DIAGNOSIS — H548 Legal blindness, as defined in USA: Secondary | ICD-10-CM | POA: Diagnosis not present

## 2023-11-02 DIAGNOSIS — F17210 Nicotine dependence, cigarettes, uncomplicated: Secondary | ICD-10-CM | POA: Insufficient documentation

## 2023-11-02 DIAGNOSIS — K5900 Constipation, unspecified: Secondary | ICD-10-CM | POA: Diagnosis not present

## 2023-11-02 LAB — I-STAT CHEM 8, ED
BUN: 10 mg/dL (ref 8–23)
Calcium, Ion: 1.12 mmol/L — ABNORMAL LOW (ref 1.15–1.40)
Chloride: 111 mmol/L (ref 98–111)
Creatinine, Ser: 0.7 mg/dL (ref 0.44–1.00)
Glucose, Bld: 121 mg/dL — ABNORMAL HIGH (ref 70–99)
HCT: 33 % — ABNORMAL LOW (ref 36.0–46.0)
Hemoglobin: 11.2 g/dL — ABNORMAL LOW (ref 12.0–15.0)
Potassium: 2.7 mmol/L — CL (ref 3.5–5.1)
Sodium: 143 mmol/L (ref 135–145)
TCO2: 18 mmol/L — ABNORMAL LOW (ref 22–32)

## 2023-11-02 LAB — CBC
HCT: 36.2 % (ref 36.0–46.0)
Hemoglobin: 11.9 g/dL — ABNORMAL LOW (ref 12.0–15.0)
MCH: 33.1 pg (ref 26.0–34.0)
MCHC: 32.9 g/dL (ref 30.0–36.0)
MCV: 100.6 fL — ABNORMAL HIGH (ref 80.0–100.0)
Platelets: 276 K/uL (ref 150–400)
RBC: 3.6 MIL/uL — ABNORMAL LOW (ref 3.87–5.11)
RDW: 13.3 % (ref 11.5–15.5)
WBC: 6.8 K/uL (ref 4.0–10.5)
nRBC: 0 % (ref 0.0–0.2)

## 2023-11-02 LAB — CBC WITH DIFFERENTIAL/PLATELET
Abs Immature Granulocytes: 0.02 K/uL (ref 0.00–0.07)
Basophils Absolute: 0 K/uL (ref 0.0–0.1)
Basophils Relative: 0 %
Eosinophils Absolute: 0 K/uL (ref 0.0–0.5)
Eosinophils Relative: 0 %
HCT: 34.1 % — ABNORMAL LOW (ref 36.0–46.0)
Hemoglobin: 11.5 g/dL — ABNORMAL LOW (ref 12.0–15.0)
Immature Granulocytes: 0 %
Lymphocytes Relative: 25 %
Lymphs Abs: 1.4 K/uL (ref 0.7–4.0)
MCH: 33.6 pg (ref 26.0–34.0)
MCHC: 33.7 g/dL (ref 30.0–36.0)
MCV: 99.7 fL (ref 80.0–100.0)
Monocytes Absolute: 0.2 K/uL (ref 0.1–1.0)
Monocytes Relative: 4 %
Neutro Abs: 4 K/uL (ref 1.7–7.7)
Neutrophils Relative %: 71 %
Platelets: 261 K/uL (ref 150–400)
RBC: 3.42 MIL/uL — ABNORMAL LOW (ref 3.87–5.11)
RDW: 13.3 % (ref 11.5–15.5)
WBC: 5.7 K/uL (ref 4.0–10.5)
nRBC: 0 % (ref 0.0–0.2)

## 2023-11-02 LAB — COMPREHENSIVE METABOLIC PANEL WITH GFR
ALT: 9 U/L (ref 0–44)
AST: 15 U/L (ref 15–41)
Albumin: 3.2 g/dL — ABNORMAL LOW (ref 3.5–5.0)
Alkaline Phosphatase: 53 U/L (ref 38–126)
Anion gap: 11 (ref 5–15)
BUN: 12 mg/dL (ref 8–23)
CO2: 18 mmol/L — ABNORMAL LOW (ref 22–32)
Calcium: 8.2 mg/dL — ABNORMAL LOW (ref 8.9–10.3)
Chloride: 112 mmol/L — ABNORMAL HIGH (ref 98–111)
Creatinine, Ser: 0.77 mg/dL (ref 0.44–1.00)
GFR, Estimated: >60 mL/min (ref >60)
Glucose, Bld: 124 mg/dL — ABNORMAL HIGH (ref 70–99)
Potassium: 2.7 mmol/L — CL (ref 3.5–5.1)
Sodium: 141 mmol/L (ref 135–145)
Total Bilirubin: 0.7 mg/dL (ref 0.0–1.2)
Total Protein: 6 g/dL — ABNORMAL LOW (ref 6.5–8.1)

## 2023-11-02 LAB — TYPE AND SCREEN
ABO/RH(D): O POS
Antibody Screen: NEGATIVE

## 2023-11-02 LAB — MAGNESIUM: Magnesium: 1.7 mg/dL (ref 1.7–2.4)

## 2023-11-02 MED ORDER — AMLODIPINE BESYLATE 5 MG PO TABS
10.0000 mg | ORAL_TABLET | Freq: Every day | ORAL | Status: DC
Start: 2023-11-03 — End: 2023-11-03
  Administered 2023-11-03: 10 mg via ORAL
  Filled 2023-11-02: qty 2

## 2023-11-02 MED ORDER — HYDROCODONE-ACETAMINOPHEN 5-325 MG PO TABS
1.0000 | ORAL_TABLET | Freq: Once | ORAL | Status: AC
  Administered 2023-11-02: 1 via ORAL
  Filled 2023-11-02: qty 1

## 2023-11-02 MED ORDER — PANTOPRAZOLE SODIUM 40 MG IV SOLR
40.0000 mg | Freq: Two times a day (BID) | INTRAVENOUS | Status: DC
  Administered 2023-11-03: 40 mg via INTRAVENOUS
  Filled 2023-11-02 (×2): qty 10

## 2023-11-02 MED ORDER — SODIUM CHLORIDE 0.9 % IV BOLUS
1000.0000 mL | Freq: Once | INTRAVENOUS | Status: AC
  Administered 2023-11-02: 1000 mL via INTRAVENOUS

## 2023-11-02 MED ORDER — ALPRAZOLAM 0.5 MG PO TABS
0.5000 mg | ORAL_TABLET | Freq: Every evening | ORAL | Status: DC | PRN
  Administered 2023-11-02: 0.5 mg via ORAL
  Filled 2023-11-02: qty 1

## 2023-11-02 MED ORDER — POTASSIUM CHLORIDE CRYS ER 20 MEQ PO TBCR
40.0000 meq | EXTENDED_RELEASE_TABLET | Freq: Once | ORAL | Status: AC
  Administered 2023-11-02: 40 meq via ORAL
  Filled 2023-11-02: qty 2

## 2023-11-02 MED ORDER — ONDANSETRON HCL 4 MG PO TABS: 4.0000 mg | ORAL_TABLET | Freq: Four times a day (QID) | ORAL | Status: DC | PRN

## 2023-11-02 MED ORDER — LATANOPROST 0.005 % OP SOLN
1.0000 [drp] | Freq: Every day | OPHTHALMIC | Status: DC
  Administered 2023-11-02: 1 [drp] via OPHTHALMIC
  Filled 2023-11-02: qty 2.5

## 2023-11-02 MED ORDER — ATORVASTATIN CALCIUM 10 MG PO TABS
10.0000 mg | ORAL_TABLET | Freq: Every day | ORAL | Status: DC
Start: 2023-11-02 — End: 2023-11-03
  Administered 2023-11-02: 10 mg via ORAL
  Filled 2023-11-02: qty 1

## 2023-11-02 MED ORDER — GABAPENTIN 300 MG PO CAPS
300.0000 mg | ORAL_CAPSULE | Freq: Three times a day (TID) | ORAL | Status: DC
  Administered 2023-11-02 – 2023-11-03 (×2): 300 mg via ORAL
  Filled 2023-11-02 (×2): qty 1

## 2023-11-02 MED ORDER — MIRTAZAPINE 15 MG PO TABS
7.5000 mg | ORAL_TABLET | Freq: Every day | ORAL | Status: DC
  Administered 2023-11-02: 7.5 mg via ORAL
  Filled 2023-11-02: qty 1

## 2023-11-02 MED ORDER — HYDRALAZINE HCL 20 MG/ML IJ SOLN
10.0000 mg | Freq: Once | INTRAMUSCULAR | Status: AC
  Administered 2023-11-02: 10 mg via INTRAVENOUS
  Filled 2023-11-02: qty 1

## 2023-11-02 MED ORDER — POTASSIUM CHLORIDE CRYS ER 20 MEQ PO TBCR
40.0000 meq | EXTENDED_RELEASE_TABLET | Freq: Three times a day (TID) | ORAL | Status: DC
  Administered 2023-11-02: 40 meq via ORAL
  Filled 2023-11-02 (×2): qty 2

## 2023-11-02 MED ORDER — ONDANSETRON HCL 4 MG/2ML IJ SOLN: 4.0000 mg | Freq: Four times a day (QID) | INTRAMUSCULAR | Status: DC | PRN

## 2023-11-02 MED ORDER — LACTATED RINGERS IV SOLN: INTRAVENOUS | Status: DC

## 2023-11-02 MED ORDER — BICTEGRAVIR-EMTRICITAB-TENOFOV 50-200-25 MG PO TABS
1.0000 | ORAL_TABLET | Freq: Every day | ORAL | Status: DC
  Administered 2023-11-03: 1 via ORAL
  Filled 2023-11-02 (×2): qty 1

## 2023-11-02 MED ORDER — POTASSIUM CHLORIDE 10 MEQ/100ML IV SOLN
10.0000 meq | Freq: Once | INTRAVENOUS | Status: AC
  Administered 2023-11-02: 10 meq via INTRAVENOUS
  Filled 2023-11-02: qty 100

## 2023-11-02 MED ORDER — BENAZEPRIL HCL 20 MG PO TABS
40.0000 mg | ORAL_TABLET | Freq: Every day | ORAL | Status: DC
  Administered 2023-11-03: 40 mg via ORAL
  Filled 2023-11-02: qty 2

## 2023-11-02 MED ORDER — PANTOPRAZOLE SODIUM 40 MG IV SOLR
40.0000 mg | Freq: Once | INTRAVENOUS | Status: AC
  Administered 2023-11-02: 40 mg via INTRAVENOUS
  Filled 2023-11-02: qty 10

## 2023-11-02 NOTE — ED Triage Notes (Signed)
 Pt BIB [facility] for generalized weakness. Pt is blind in both eyes and takes care of herself at home. Pts family said that her stool has been black for a few days. Pts BP was 128/67 laying down and then dropped to 87/50 standing .

## 2023-11-02 NOTE — ED Provider Notes (Signed)
 [facility] EMERGENCY DEPARTMENT AT [facility] Provider Note   CSN: [id number] Arrival date & time: 11/02/23  1700     Patient presents with: Weakness   Meleena Munroe is a 83 y.o. female.   Patient has been having black stools for few days.  No vomiting no diarrhea  The history is provided by the patient and medical records. No language interpreter was used.  Weakness Severity:  Moderate Onset quality:  Sudden Timing:  Constant Progression:  Worsening Chronicity:  New Context: not alcohol use   Relieved by:  Nothing Associated symptoms: no abdominal pain, no chest pain, no cough, no diarrhea, no frequency, no headaches and no seizures        Prior to Admission medications   Medication Sig Start Date End Date Taking? Authorizing Provider  acyclovir  (ZOVIRAX ) 400 MG tablet Take 400 mg by mouth 3 (three) times daily as needed. 08/03/23   [provider]  alendronate  (FOSAMAX ) 70 MG tablet Take 1 tablet (70 mg total) by mouth every Sunday. TAKE 1 TABLET BY MOUTH ONCE WEEKLY ON SUNDAY take with a full glass of water on an empty stomach Strength: 70 mg 06/17/23   Trudy Mliss Dragon, MD  ALPRAZolam  (XANAX ) 0.5 MG tablet Take 1 tablet (0.5 mg total) by mouth at bedtime as needed for sleep or anxiety. 06/14/23 06/13/24  Trudy Mliss Dragon, MD  amLODipine  (NORVASC ) 10 MG tablet Take 10 mg by mouth daily. 07/04/23   [provider]  aspirin  EC 81 MG tablet Take 1 tablet (81 mg total) by mouth daily with breakfast. 06/14/23   Trudy Mliss Dragon, MD  atorvastatin  (LIPITOR) 10 MG tablet Take 1 tablet (10 mg total) by mouth at bedtime. 06/14/23   Trudy Mliss Dragon, MD  benazepril  (LOTENSIN ) 40 MG tablet Take 40 mg by mouth daily. 08/03/23   [provider]  bictegravir-emtricitabine -tenofovir  AF (BIKTARVY ) 50-200-25 MG TABS tablet Take 1 tablet by mouth daily. 08/14/23   Fleeta Kathie Jomarie LOISE, MD  brimonidine  (ALPHAGAN ) 0.2 % ophthalmic solution 1 drop  3 (three) times daily. 04/28/23   [provider]  clopidogrel  (PLAVIX ) 75 MG tablet Take 1 tablet (75 mg total) by mouth daily. 06/14/23   Trudy Mliss Dragon, MD  cyanocobalamin  (VITAMIN B12) 1000 MCG tablet Take 1 tablet (1,000 mcg total) by mouth daily. 04/04/23   Kandala, Hyndavi, MD  diclofenac  Sodium (VOLTAREN ) 1 % GEL Apply 2 g topically 4 (four) times daily. APPLY TWO GRAMS TO AFFECTED AREA(S) FOUR TIMES DAILY Strength: 1 % 10/31/22   Leopold Damien NOVAK, MD  DIPHENHYDRAMINE  HCL, TOPICAL, (BENADRYL  ITCH STOPPING) 2 % GEL Apply 1 Application topically every 4 (four) hours as needed (symptoms of bugs on scalp). 01/19/23   Leopold Damien NOVAK, MD  dorzolamide -timolol  (COSOPT ) 2-0.5 % ophthalmic solution Place 1 drop into the right eye 2 (two) times daily. 07/18/22   [provider]  ferrous sulfate  325 (65 FE) MG tablet Take 1 tablet (325 mg total) by mouth daily with breakfast. 01/01/23   Tat, Alm, MD  gabapentin  (NEURONTIN ) 300 MG capsule TAKE ONE CAPSULE BY MOUTH THREE TIMES DAILY (DOSE decrease) 09/24/23   Trudy Mliss Dragon, MD  latanoprost  (XALATAN ) 0.005 % ophthalmic solution 1 drop at bedtime. 07/04/23   [provider]  mirtazapine  (REMERON ) 7.5 MG tablet Take 1 tablet (7.5 mg total) by mouth at bedtime. 09/25/23   Trudy Mliss Dragon, MD  nicotine  (NICODERM CQ  - DOSED IN MG/24 HOURS) 14 mg/24hr patch Place 1  patch (14 mg total) onto the skin daily. 09/27/23 11/08/23  Amoako, Prince, MD  oxyCODONE -acetaminophen  (PERCOCET/ROXICET) 5-325 MG tablet Take 1 tablet only for severe pain; as often as every 8 hours.  Use sparingly due to risk of falls. 11/05/23   Trudy Mliss Dragon, MD  polyethylene glycol (MIRALAX ) 17 g packet Take 17 g by mouth daily. 05/30/23   Masters, Izetta, DO  predniSONE  (DELTASONE ) 10 MG tablet Take as directed per MD instructions 08/08/23   Currieo, Andrew D, MD  senna (SENOKOT) 8.6 MG TABS tablet Take 1 tablet (8.6 mg total) by mouth daily. 05/30/23   Masters,  Katie, DO  thiamine  (VITAMIN B1) 100 MG tablet Take 1 tablet (100 mg total) by mouth daily. 01/15/23   Leopold Damien NOVAK, MD  valACYclovir  (VALTREX ) 1000 MG tablet Take 1 tablet (1,000 mg total) by mouth 2 (two) times daily. 06/05/23   Fleeta Kathie Jomarie LOISE, MD    Allergies: Hctz [hydrochlorothiazide ]    Review of Systems  Constitutional:  Negative for appetite change and fatigue.  HENT:  Negative for congestion, ear discharge and sinus pressure.   Eyes:  Negative for discharge.  Respiratory:  Negative for cough.   Cardiovascular:  Negative for chest pain.  Gastrointestinal:  Negative for abdominal pain and diarrhea.       Black stool  Genitourinary:  Negative for frequency and hematuria.  Musculoskeletal:  Negative for back pain.  Skin:  Negative for rash.  Neurological:  Positive for weakness. Negative for seizures and headaches.  Psychiatric/Behavioral:  Negative for hallucinations.     Updated Vital Signs Ht 5' 7 (1.702 m)   Wt 57.2 kg   BMI 19.73 kg/m   Physical Exam Vitals and nursing note reviewed.  Constitutional:      Appearance: She is well-developed.  HENT:     Head: Normocephalic.     Nose: Nose normal.  Eyes:     General: No scleral icterus.    Conjunctiva/sclera: Conjunctivae normal.  Neck:     Thyroid : No thyromegaly.  Cardiovascular:     Rate and Rhythm: Normal rate and regular rhythm.     Heart sounds: No murmur heard.    No friction rub. No gallop.  Pulmonary:     Breath sounds: No stridor. No wheezing or rales.  Chest:     Chest wall: No tenderness.  Abdominal:     General: There is no distension.     Tenderness: There is no abdominal tenderness. There is no rebound.  Musculoskeletal:        General: Normal range of motion.     Cervical back: Neck supple.  Lymphadenopathy:     Cervical: No cervical adenopathy.  Skin:    Findings: No erythema or rash.  Neurological:     Mental Status: She is alert and oriented to person, place, and time.      Motor: No abnormal muscle tone.     Coordination: Coordination normal.  Psychiatric:        Behavior: Behavior normal.     (all labs ordered are listed, but only abnormal results are displayed) Labs Reviewed  CBC WITH DIFFERENTIAL/PLATELET - Abnormal; Notable for the following components:      Result Value   RBC 3.42 (*)    Hemoglobin 11.5 (*)    HCT 34.1 (*)    All other components within normal limits  COMPREHENSIVE METABOLIC PANEL WITH GFR - Abnormal; Notable for the following components:   Potassium 2.7 (*)    Chloride  112 (*)    CO2 18 (*)    Glucose, Bld 124 (*)    Calcium  8.2 (*)    Total Protein 6.0 (*)    Albumin  3.2 (*)    All other components within normal limits  I-STAT CHEM 8, ED - Abnormal; Notable for the following components:   Potassium 2.7 (*)    Glucose, Bld 121 (*)    Calcium , Ion 1.12 (*)    TCO2 18 (*)    Hemoglobin 11.2 (*)    HCT 33.0 (*)    All other components within normal limits  CBC  MAGNESIUM   POC OCCULT BLOOD, ED  TYPE AND SCREEN    EKG: None  Radiology: No results found.   Procedures   Medications Ordered in the ED  potassium chloride  10 mEq in 100 mL IVPB (10 mEq Intravenous New Bag/Given 11/02/23 1832)  potassium chloride  SA (KLOR-CON  M) CR tablet 40 mEq (has no administration in time range)  sodium chloride  0.9 % bolus 1,000 mL (1,000 mLs Intravenous New Bag/Given 11/02/23 1738)  pantoprazole  (PROTONIX ) injection 40 mg (40 mg Intravenous Given 11/02/23 1740)  potassium chloride  SA (KLOR-CON  M) CR tablet 40 mEq (40 mEq Oral Given 11/02/23 1808)  HYDROcodone -acetaminophen  (NORCO/VICODIN) 5-325 MG per tablet 1 tablet (1 tablet Oral Given 11/02/23 1818)   Patient with heme positive stool and very dark stools.  I did talk with gi about the pt.   he would like patient put on PPI and n.p.o. after midnight and they will consult tomorrow     CRITICAL CARE Performed by: Fairy Sermon Total critical care time: 45 minutes Critical care  time was exclusive of separately billable procedures and treating other patients. Critical care was necessary to treat or prevent imminent or life-threatening deterioration. Critical care was time spent personally by me on the following activities: development of treatment plan with patient and/or surrogate as well as nursing, discussions with consultants, evaluation of patient's response to treatment, examination of patient, obtaining history from patient or surrogate, ordering and performing treatments and interventions, ordering and review of laboratory studies, ordering and review of radiographic studies, pulse oximetry and re-evaluation of patient's condition.                                Medical Decision Making Amount and/or Complexity of Data Reviewed Labs: ordered.  Risk Prescription drug management. Decision regarding hospitalization.   Upper GI bleed with stable hemoglobin.  Patient will be seen by GI tomorrow and admitted to medicine     Final diagnoses:  Upper GI bleed    ED Discharge Orders     None          Sermon Fairy, MD 11/05/23 1710

## 2023-11-02 NOTE — H&P (Signed)
 History and Physical    Patient: Jessica [name] FMW:[id number] DOB: [birth year] DOA: 11/02/2023 DOS: the patient was seen and examined on 11/02/2023 PCP: Trudy Mliss Dragon, MD  Patient coming from: Home  Chief Complaint:  Chief Complaint  Patient presents with   Weakness   HPI: Jessica [name] is a 83 y.o. female with medical history significant of hypertension, glaucoma, blindness, HIV on antivirals.  Patient presents with dark stools.  Due to blindness, she was unaware of stools so is uncertain of how long this has been going on.  Her son went to work this morning.  When he got home and looked at her stools, he called EMS for her to come to the hospital. No abdominal pain, fevers, chills, SOB, chest pain.  Review of Systems: As mentioned in the history of present illness. All other systems reviewed and are negative. 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  Hearing aides with possible masking of tinnitus recommended but patient wished to defer secondary to finances.   Blood transfusion without reported diagnosis    pt denies   Bursitis of right shoulder 07/12/2012   s/p shoulder injection 07/12/2012    Cataract of right eye    REMOVED RIGHT EYE 4-19    Constipation due to pain medication 04/27/2010   Diverticulosis 02/08/2012   Extensive left-sided diverticula on colonoscopy March 2012 per Dr. Shaaron    Diverticulosis of colon without hemorrhage 02/08/2012   Extensive left-sided diverticula on colonoscopy March 2012 per Dr. 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   Headache 10/20/2019    Heart murmur 1961   History of vitamin D  deficiency 05/29/2018   Vitamin D  18.96 (04/30/2018), treated with ergocalciferol  50,000 units PO QWk X 4 weeks     Human immunodeficiency virus disease (HCC) 03/27/1986   Hyperlipidemia LDL goal < 100 04/05/2012   Hypokalemia 04/12/2018   Insomnia 10/20/2019   Iron deficiency anemia due to chronic blood loss 04/04/2023   Left hip pain 04/11/2021   Leg pain 04/11/2021   Long-term current use of opiate analgesic 03/17/2016   Loose stools 04/08/2019   Lumbar degenerative disc disease 07/20/2006   With chronic back pain    Marijuana use 07/03/2016   Micturition syncope 09/20/2015   Nausea and vomiting 04/08/2019   Opiate dependence (HCC) 04/12/2018   Opioid dependence with current use (HCC) 04/12/2018   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   s/p aortobifem bypass 2009    Periumbilical hernia 05/18/2014   1 cm left periumbilical abdominal wall defect   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   DEXA 04/15/2012: L1-L4 spine T -3.9, Right femur T -3.0    Right rotator cuff tear 02/01/2013   Responds to periodic steroid injections   Seborrhea 09/01/2010   Shoulder pain, right 12/04/2017   Small bowel obstruction due to adhesions (HCC) 02/08/2012   s/p Exploratory laparotomy, lysis of adhesions 02/12/12     Subjective tinnitus of both ears 05/18/2014   Tobacco abuse 02/19/2012   Tobacco abuse    Vasovagal syncope 02/15/2015   Visual hallucination 12/04/2022   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 Merrilyn; took out 12 inches of small intestiines and removed blockage   COLONOSCOPY  2012   COLONOSCOPY WITH PROPOFOL  N/A 12/29/2022   Procedure: COLONOSCOPY WITH PROPOFOL ;  Surgeon: Eartha Angelia Sieving, MD;  Location: [facility];  Service: Gastroenterology;  Laterality: N/A;   ESOPHAGOGASTRODUODENOSCOPY (EGD) WITH PROPOFOL  N/A 12/28/2022   Procedure: ESOPHAGOGASTRODUODENOSCOPY (EGD) WITH PROPOFOL ;  Surgeon: Cinderella Deatrice FALCON, MD;  Location: [facility];  Service: Endoscopy;  Laterality: N/A;   EYE SURGERY     EYE SURGERY  06/29/2020   06-29-2020- RIGHT CATARACT REMOVED AND LEFT EYE SURGERY TO REMOVE SAND LIKE SUBSTANCE    FEMORAL ARTERY EXPLORATION Left 07/10/2020   Procedure: REDO LEFT FEMORAL ARTERY EXPOSURE;  Surgeon: Serene Gaile ORN, MD;  Location: [facility];  Service: Vascular;  Laterality: Left;   GIVENS CAPSULE STUDY N/A 12/30/2022   Procedure: GIVENS CAPSULE STUDY;  Surgeon: Eartha Angelia Sieving, MD;  Location: [facility];  Service: Gastroenterology;  Laterality: N/A;   LAPAROTOMY  02/12/2012   Procedure: EXPLORATORY LAPAROTOMY;  Surgeon: Jina Nephew, MD;  Location: [facility];  Service: General;  Laterality: N/A;  Exploratory Laparotomy, lysis of adhesions   SMALL INTESTINE SURGERY     THROMBECTOMY FEMORAL ARTERY Left 07/10/2020   Procedure: THROMBECTOMY AORTA-BIFEMORAL GRAFT, PROFUNDA, AND SUPERFICIAL FEMORAL ARTERY  LEFT LEG;  Surgeon: Serene Gaile ORN, MD;  Location: [facility];  Service: Vascular;  Laterality: Left;   Social History:  reports that she has been smoking cigarettes. She has a 15 pack-year smoking history. She has been exposed to tobacco smoke. She has never used smokeless tobacco. She reports that she does not drink alcohol and does not use drugs.  Allergies  Allergen Reactions   Hctz [Hydrochlorothiazide ] Other (See  Comments)    Dizziness, syncope; does NOT wish to take anymore    Family History  Problem Relation Age of Onset   Kidney failure Mother    Diabetes Mother    Hypertension Mother    Heart disease Mother    Glaucoma Father    Congestive Heart Failure Sister    Diabetes Sister    Kidney disease Sister    Diabetes Brother    Unexplained death Brother 18       Automobile accident   Hypothyroidism Daughter    Arthritis Daughter        Neck/Back   Healthy Son    HIV/AIDS Brother    HIV Daughter    Kidney disease Daughter    Arthritis Son        Knee   Colon cancer Neg Hx    Colon polyps Neg Hx    Esophageal cancer Neg Hx    Rectal cancer Neg Hx    Stomach cancer Neg Hx     Prior to Admission medications   Medication  Sig Start Date End Date Taking? Authorizing Provider  acyclovir  (ZOVIRAX ) 400 MG tablet Take 400 mg by mouth 3 (three) times daily as needed. 08/03/23   [provider]  alendronate  (FOSAMAX ) 70 MG tablet Take 1 tablet (70 mg total) by mouth every Sunday. TAKE 1 TABLET BY MOUTH ONCE WEEKLY ON SUNDAY take with a full glass of water on an empty stomach Strength: 70 mg 06/17/23   Trudy Mliss Dragon, MD  ALPRAZolam  (XANAX ) 0.5 MG tablet Take 1 tablet (0.5 mg total) by mouth at bedtime as needed for sleep or anxiety. 06/14/23 06/13/24  Trudy Mliss Dragon, MD  amLODipine  (NORVASC ) 10 MG tablet Take 10 mg by mouth daily. 07/04/23   [provider]  aspirin  EC 81 MG tablet Take 1 tablet (81 mg total) by mouth daily with breakfast. 06/14/23   Trudy Mliss Dragon, MD  atorvastatin  (LIPITOR) 10 MG tablet Take 1 tablet (10 mg total) by mouth at bedtime. 06/14/23   Trudy Mliss Dragon, MD  benazepril  (LOTENSIN ) 40 MG tablet Take 40 mg by mouth daily. 08/03/23   [provider]  bictegravir-emtricitabine -tenofovir  AF (BIKTARVY ) 50-200-25 MG TABS tablet Take 1 tablet by mouth daily. 08/14/23   Fleeta Kathie Jomarie LOISE, MD  brimonidine  (ALPHAGAN ) 0.2 % ophthalmic  solution 1 drop 3 (three) times daily. 04/28/23   [provider]  clopidogrel  (PLAVIX ) 75 MG tablet Take 1 tablet (75 mg total) by mouth daily. 06/14/23   Trudy Mliss Dragon, MD  cyanocobalamin  (VITAMIN B12) 1000 MCG tablet Take 1 tablet (1,000 mcg total) by mouth daily. 04/04/23   Kandala, Hyndavi, MD  diclofenac  Sodium (VOLTAREN ) 1 % GEL Apply 2 g topically 4 (four) times daily. APPLY TWO GRAMS TO AFFECTED AREA(S) FOUR TIMES DAILY Strength: 1 % 10/31/22   Leopold Damien NOVAK, MD  DIPHENHYDRAMINE  HCL, TOPICAL, (BENADRYL  ITCH STOPPING) 2 % GEL Apply 1 Application topically every 4 (four) hours as needed (symptoms of bugs on scalp). 01/19/23   Leopold Damien NOVAK, MD  dorzolamide -timolol  (COSOPT ) 2-0.5 % ophthalmic solution Place 1 drop into the right eye 2 (two) times daily. 07/18/22   [provider]  ferrous sulfate  325 (65 FE) MG tablet Take 1 tablet (325 mg total) by mouth daily with breakfast. 01/01/23   Tat, Alm, MD  gabapentin  (NEURONTIN ) 300 MG capsule TAKE ONE CAPSULE BY MOUTH THREE TIMES DAILY (DOSE decrease) 09/24/23   Trudy Mliss Dragon, MD  latanoprost  (XALATAN ) 0.005 % ophthalmic solution 1 drop at bedtime. 07/04/23   [provider]  mirtazapine  (REMERON ) 7.5 MG tablet Take 1 tablet (7.5 mg total) by mouth at bedtime. 09/25/23   Trudy Mliss Dragon, MD  nicotine  (NICODERM CQ  - DOSED IN MG/24 HOURS) 14 mg/24hr patch Place 1 patch (14 mg total) onto the skin daily. 09/27/23 11/08/23  Amoako, Prince, MD  oxyCODONE -acetaminophen  (PERCOCET/ROXICET) 5-325 MG tablet Take 1 tablet only for severe pain; as often as every 8 hours.  Use sparingly due to risk of falls. 11/05/23   Trudy Mliss Dragon, MD  polyethylene glycol (MIRALAX ) 17 g packet Take 17 g by mouth daily. 05/30/23   Masters, Izetta, DO  predniSONE  (DELTASONE ) 10 MG tablet Take as directed per MD instructions 08/08/23   Currieo, Andrew D, MD  senna (SENOKOT) 8.6 MG TABS tablet Take 1 tablet (8.6 mg total) by mouth daily.  05/30/23   Masters, Katie, DO  thiamine  (VITAMIN B1) 100 MG tablet Take 1 tablet (100 mg total) by mouth daily. 01/15/23   Leopold Damien NOVAK, MD  valACYclovir  (VALTREX ) 1000 MG tablet Take 1 tablet (1,000 mg total) by mouth 2 (two) times daily. 06/05/23   Fleeta Kathie Jomarie LOISE, MD    Physical Exam: Vitals:   11/02/23 1711 11/02/23 1715  BP:  137/69  Pulse:  (!) 57  SpO2:  96%  Weight: 57.2 kg   Height: 5' 7 (1.702 m)    General: elderly female. Awake and alert and oriented x3. No acute cardiopulmonary distress.  HEENT: Normocephalic atraumatic.  Right and left ears normal in appearance.   Extraocular muscles are intact. Sclerae anicteric and noninjected.  Moist mucosal membranes. No mucosal lesions.  Neck: Neck supple without lymphadenopathy. No carotid bruits. No masses palpated.  Cardiovascular: Regular rate with normal S1-S2 sounds. No murmurs, rubs, gallops auscultated. No JVD.  Respiratory: Good respiratory effort with no wheezes, rales, rhonchi. Lungs clear to auscultation bilaterally.  No accessory muscle use. Abdomen: Soft, nontender, nondistended. Active bowel sounds. No masses or hepatosplenomegaly  Skin: No rashes, lesions, or ulcerations.  Dry, warm to touch. 2+ dorsalis pedis and radial pulses. Musculoskeletal: No calf or leg pain. All major joints not erythematous nontender.  No upper or lower joint deformation.  Good ROM.  No contractures  Psychiatric: Intact judgment and insight. Pleasant and cooperative. Neurologic: No focal neurological deficits. Strength is 5/5 and symmetric in upper and lower extremities.  Cranial nerves II through XII are grossly intact.  Data Reviewed: Images and labs reviewed by me.  Assessment and Plan: No notes have been filed under this hospital service. Service: Hospitalist  Principal Problem:   GI bleed hospitalized Fall 2024, no source found Active Problems:   Human immunodeficiency virus disease (HCC)   Essential hypertension    Hypokalemia   Legal blindness  Upper GI bleed Eliquis N.p.o. after midnight Protonix  GI consults Hold Plavix  and aspirin  HIV Continue antivirals Hypertension Continue antihypertensives Hypokalemia Continue potassium, will give oral Check magnesium  level Recheck potassium morning Blindness   Advance Care Planning:   Code Status: Full Code confirmed by patient  Consults: GI  Family Communication: None  Severity of Illness: The appropriate patient status for this patient is OBSERVATION. Observation status is judged to be reasonable and necessary in order to provide the required intensity of service to ensure the patient's safety. The patient's presenting symptoms, physical exam findings, and initial radiographic and laboratory data in the context of their medical condition is felt to place them at decreased risk for further clinical deterioration. Furthermore, it is anticipated that the patient will be medically stable for discharge from the hospital within 2 midnights of admission.   Author: Larnell Granlund J Aeneas Longsworth, DO 11/02/2023 7:19 PM  For on call review [email or web address].

## 2023-11-03 ENCOUNTER — Observation Stay (HOSPITAL_COMMUNITY): Admitting: Anesthesiology

## 2023-11-03 ENCOUNTER — Encounter (HOSPITAL_COMMUNITY): Payer: Self-pay | Admitting: Family Medicine

## 2023-11-03 ENCOUNTER — Encounter (HOSPITAL_COMMUNITY)
Admission: EM | Disposition: A | Discharge status: Home / Self Care | Payer: Self-pay | Source: Home / Self Care | Attending: Emergency Medicine

## 2023-11-03 DIAGNOSIS — I10 Essential (primary) hypertension: Secondary | ICD-10-CM

## 2023-11-03 DIAGNOSIS — K31A11 Gastric intestinal metaplasia without dysplasia, involving the antrum: Secondary | ICD-10-CM

## 2023-11-03 DIAGNOSIS — K921 Melena: Secondary | ICD-10-CM | POA: Diagnosis not present

## 2023-11-03 DIAGNOSIS — K2950 Unspecified chronic gastritis without bleeding: Secondary | ICD-10-CM | POA: Diagnosis not present

## 2023-11-03 DIAGNOSIS — B20 Human immunodeficiency virus [HIV] disease: Secondary | ICD-10-CM

## 2023-11-03 DIAGNOSIS — R195 Other fecal abnormalities: Secondary | ICD-10-CM | POA: Insufficient documentation

## 2023-11-03 HISTORY — PX: ESOPHAGOGASTRODUODENOSCOPY: SHX5428

## 2023-11-03 LAB — IRON AND TIBC
Iron: 32 ug/dL (ref 28–170)
Saturation Ratios: 12 % (ref 10.4–31.8)
TIBC: 277 ug/dL (ref 250–450)
UIBC: 245 ug/dL

## 2023-11-03 LAB — CBC
HCT: 36.7 % (ref 36.0–46.0)
Hemoglobin: 12 g/dL (ref 12.0–15.0)
MCH: 32.4 pg (ref 26.0–34.0)
MCHC: 32.7 g/dL (ref 30.0–36.0)
MCV: 99.2 fL (ref 80.0–100.0)
Platelets: 279 K/uL (ref 150–400)
RBC: 3.7 MIL/uL — ABNORMAL LOW (ref 3.87–5.11)
RDW: 13.2 % (ref 11.5–15.5)
WBC: 6.4 K/uL (ref 4.0–10.5)
nRBC: 0 % (ref 0.0–0.2)

## 2023-11-03 LAB — BASIC METABOLIC PANEL WITH GFR
Anion gap: 8 (ref 5–15)
BUN: 8 mg/dL (ref 8–23)
CO2: 18 mmol/L — ABNORMAL LOW (ref 22–32)
Calcium: 8.3 mg/dL — ABNORMAL LOW (ref 8.9–10.3)
Chloride: 113 mmol/L — ABNORMAL HIGH (ref 98–111)
Creatinine, Ser: 0.63 mg/dL (ref 0.44–1.00)
GFR, Estimated: >60 mL/min (ref >60)
Glucose, Bld: 78 mg/dL (ref 70–99)
Potassium: 3.8 mmol/L (ref 3.5–5.1)
Sodium: 139 mmol/L (ref 135–145)

## 2023-11-03 LAB — FERRITIN: Ferritin: 47 ng/mL (ref 11–307)

## 2023-11-03 LAB — FOLATE: Folate: 14 ng/mL (ref >5.9)

## 2023-11-03 LAB — GLUCOSE, CAPILLARY
Glucose-Capillary: 80 mg/dL (ref 70–99)
Glucose-Capillary: 107 mg/dL — ABNORMAL HIGH (ref 70–99)

## 2023-11-03 LAB — VITAMIN B12: Vitamin B-12: 385 pg/mL (ref 180–914)

## 2023-11-03 SURGERY — EGD (ESOPHAGOGASTRODUODENOSCOPY)
Anesthesia: General

## 2023-11-03 MED ORDER — POTASSIUM CHLORIDE 10 MEQ/100ML IV SOLN: 10.0000 meq | INTRAVENOUS | Status: DC

## 2023-11-03 MED ORDER — POTASSIUM CHLORIDE CRYS ER 20 MEQ PO TBCR: 40.0000 meq | EXTENDED_RELEASE_TABLET | ORAL | Status: DC

## 2023-11-03 MED ORDER — CYANOCOBALAMIN 1000 MCG/ML IJ SOLN
1000.0000 ug | Freq: Once | INTRAMUSCULAR | Status: AC
  Administered 2023-11-03: 1000 ug via INTRAMUSCULAR
  Filled 2023-11-03: qty 1

## 2023-11-03 MED ORDER — LACTATED RINGERS IV SOLN: INTRAVENOUS | Status: DC | PRN

## 2023-11-03 MED ORDER — HYDROCODONE-ACETAMINOPHEN 5-325 MG PO TABS
1.0000 | ORAL_TABLET | Freq: Four times a day (QID) | ORAL | Status: DC | PRN
  Administered 2023-11-03 (×2): 1 via ORAL
  Filled 2023-11-03 (×2): qty 1

## 2023-11-03 MED ORDER — PROPOFOL 10 MG/ML IV BOLUS
INTRAVENOUS | Status: DC | PRN
Start: 2023-11-03 — End: 2023-11-03
  Administered 2023-11-03: 100 mg via INTRAVENOUS

## 2023-11-03 MED ORDER — HYDRALAZINE HCL 20 MG/ML IJ SOLN
10.0000 mg | Freq: Four times a day (QID) | INTRAMUSCULAR | Status: DC | PRN
  Administered 2023-11-03: 10 mg via INTRAVENOUS
  Filled 2023-11-03: qty 1

## 2023-11-03 MED ORDER — PROPOFOL 10 MG/ML IV BOLUS
INTRAVENOUS | Status: AC
  Filled 2023-11-03: qty 20

## 2023-11-03 MED ORDER — MAGNESIUM SULFATE 2 GM/50ML IV SOLN: 2.0000 g | Freq: Once | INTRAVENOUS | Status: DC

## 2023-11-03 MED ORDER — SODIUM CHLORIDE 0.9 % IV SOLN: INTRAVENOUS | Status: DC

## 2023-11-03 MED ORDER — IRON SUCROSE 200 MG IVPB - SIMPLE MED
200.0000 mg | Freq: Once | Status: AC
  Administered 2023-11-03: 200 mg via INTRAVENOUS
  Filled 2023-11-03: qty 110

## 2023-11-03 NOTE — Op Note (Signed)
 [facility] Patient Name: Jessica [name] [facility] Procedure Date: 11/03/2023 8:44 AM MRN: [id number] Date of Birth: Jul 31, 1940 Attending MD: Toribio Fortune , , [id number] CSN: [id number] Age: 83 Admit Type: Inpatient Procedure:                Upper GI endoscopy Indications:              Melena Providers:                Toribio Fortune, Olam Ada, RN, Gordy Lonni Balm, Technician Referring MD:              Medicines:                Monitored Anesthesia Care Complications:            No immediate complications. Estimated Blood Loss:     Estimated blood loss: none. Procedure:                Pre-Anesthesia Assessment:                           - Prior to the procedure, a History and Physical                            was performed, and patient medications, allergies                            and sensitivities were reviewed. The patient's                            tolerance of previous anesthesia was reviewed.                           - The risks and benefits of the procedure and the                            sedation options and risks were discussed with the                            patient. All questions were answered and informed                            consent was obtained.                           - ASA Grade Assessment: II - A patient with mild                            systemic disease.                           After obtaining informed consent, the endoscope was                            passed under direct vision. Throughout the  procedure, the patient's blood pressure, pulse, and                            oxygen  saturations were monitored continuously. The                            [id number] ([id number]) Upper was introduced through                            the mouth, and advanced to the second part of                            duodenum. The upper GI endoscopy was accomplished                             without difficulty. The patient tolerated the                            procedure well. Scope In: 9:24:27 AM Scope Out: 9:29:41 AM Total Procedure Duration: 0 hours 5 minutes 14 seconds  Findings:      The examined esophagus was normal.      The entire examined stomach was normal. Biopsies were taken with a cold       forceps for Helicobacter pylori testing.      The examined duodenum was normal. Impression:               - Normal esophagus.                           - Normal stomach. Biopsied.                           - Normal examined duodenum. Moderate Sedation:      Per Anesthesia Care Recommendation:           - Return patient to hospital ward for possible                            discharge same day.                           - Resume previous diet.                           - Await pathology results.                           - Continue pantoprazole  40 mg qday. Procedure Code(s):        --- Professional ---                           [phone], Esophagogastroduodenoscopy, flexible,                            transoral; with biopsy, single or multiple Diagnosis Code(s):        --- Professional ---  K92.1, Melena (includes Hematochezia) CPT copyright 2022 American [employer] Association. All rights reserved. The codes documented in this report are preliminary and upon coder review may  be revised to meet current compliance requirements. Toribio Fortune, MD Toribio Fortune,  11/03/2023 9:45:31 AM This report has been signed electronically. Number of Addenda: 0

## 2023-11-03 NOTE — Progress Notes (Signed)
 On call night MD, Barbra made aware of patient elevated blood pressure. 198/61 (97) Hydralazine  given.

## 2023-11-03 NOTE — Progress Notes (Signed)
 Patient doesn't want to sign the consent form at this time, for the EGD. Patient prefers to speak to the MD first.

## 2023-11-03 NOTE — Progress Notes (Signed)
 Patient blood pressure elevated again, 187/74 (106). Patient having knee pain. 8/10. On call MD Adefeso made aware. Received orders for PRN hydralazine  and PRN Norco.

## 2023-11-03 NOTE — Anesthesia Preprocedure Evaluation (Signed)
 Anesthesia Evaluation  Patient identified by MRN, date of birth, ID band Patient awake    Reviewed: Allergy & Precautions, H&P , NPO status , Patient's Chart, lab work & pertinent test results, reviewed documented beta blocker date and time   Airway Mallampati: II  TM Distance: >3 FB Neck ROM: full    Dental no notable dental hx.    Pulmonary neg pulmonary ROS, Current Smoker   Pulmonary exam normal breath sounds clear to auscultation       Cardiovascular Exercise Tolerance: Good hypertension, + Peripheral Vascular Disease  negative cardio ROS + Valvular Problems/Murmurs  Rhythm:regular Rate:Normal     Neuro/Psych  Headaches PSYCHIATRIC DISORDERS Anxiety      Neuromuscular disease negative neurological ROS  negative psych ROS   GI/Hepatic negative GI ROS, Neg liver ROS,,,  Endo/Other  negative endocrine ROS    Renal/GU Renal diseasenegative Renal ROS  negative genitourinary   Musculoskeletal   Abdominal   Peds  Hematology  (+) Blood dyscrasia, anemia , HIV  Anesthesia Other Findings   Reproductive/Obstetrics negative OB ROS                              Anesthesia Physical Anesthesia Plan  ASA: 4 and emergent  Anesthesia Plan: General   Post-op Pain Management:    Induction:   PONV Risk Score and Plan: Propofol  infusion  Airway Management Planned:   Additional Equipment:   Intra-op Plan:   Post-operative Plan:   Informed Consent: I have reviewed the patients History and Physical, chart, labs and discussed the procedure including the risks, benefits and alternatives for the proposed anesthesia with the patient or authorized representative who has indicated his/her understanding and acceptance.     Dental Advisory Given  Plan Discussed with: CRNA  Anesthesia Plan Comments:         Anesthesia Quick Evaluation

## 2023-11-03 NOTE — Consult Note (Signed)
 Toribio Fortune, M.D. Gastroenterology & Hepatology                                           Patient Name: Jessica [name] Account #: @FLAACCTNO @   MRN: [id number] Admission Date: 11/02/2023 Date of Evaluation:  11/03/2023 Time of Evaluation: 9:09 AM   Referring Physician: Lang Peel, DO  Chief Complaint:  melena  HPI:  This is a 83 y.o. female with history of blindness, anxiety, peripheral vascular disease, hypertension, HIV, hyperlipidemia, B12 and iron  deficiency, who came to the hospital for evaluation of possible melena.  Patient was brought to the hospital by her son after he saw her stools were black.  She is blind and cannot confirm this and does not know when this started.  Denies any abdominal pain, nausea, vomiting, fever, chills, hematochezia, abdominal distention.  No NSAID use.  She denies taking anticoagulation, it is unclear if she is taking Eliquis as outpatient.  In the ED, he was HD stable and afebrile. Labs were remarkable for K 2.7, Cl 111,  CBC Hb 11.5 rest WNL. ER staff performed rectal and stated it was melena.   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  Hearing aides with possible masking of tinnitus recommended but patient wished to defer secondary to finances.   Blood transfusion without reported diagnosis    pt denies   Bursitis of right shoulder 07/12/2012   s/p shoulder injection 07/12/2012    Cataract of right eye    REMOVED RIGHT EYE 4-19    Constipation due to pain medication 04/27/2010   Diverticulosis 02/08/2012   Extensive left-sided diverticula on colonoscopy March 2012 per Dr.  Shaaron    Diverticulosis of colon without hemorrhage 02/08/2012   Extensive left-sided diverticula on colonoscopy March 2012 per Dr. 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   Headache 10/20/2019   Heart murmur 1961   History of vitamin D  deficiency 05/29/2018   Vitamin D  18.96 (04/30/2018), treated with ergocalciferol  50,000 units PO QWk X 4 weeks     Human immunodeficiency virus disease (HCC) 03/27/1986   Hyperlipidemia LDL goal < 100 04/05/2012   Hypokalemia 04/12/2018   Insomnia 10/20/2019   Iron  deficiency anemia due to chronic blood loss 04/04/2023   Left hip pain 04/11/2021   Leg pain 04/11/2021   Long-term current use of opiate analgesic 03/17/2016   Loose stools 04/08/2019   Lumbar degenerative disc disease 07/20/2006   With chronic back pain    Marijuana use 07/03/2016   Micturition syncope 09/20/2015   Nausea and vomiting 04/08/2019   Opiate dependence (HCC) 04/12/2018   Opioid dependence with current use (HCC) 04/12/2018   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   s/p aortobifem bypass 2009    Periumbilical hernia 05/18/2014   1 cm left periumbilical abdominal wall defect   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   DEXA 04/15/2012: L1-L4 spine T -3.9, Right femur T -3.0    Right rotator  cuff tear 02/01/2013   Responds to periodic steroid injections   Seborrhea 09/01/2010   Shoulder pain, right 12/04/2017   Small bowel obstruction due to adhesions (HCC) 02/08/2012   s/p Exploratory laparotomy, lysis of adhesions 02/12/12     Subjective tinnitus of both ears 05/18/2014   Tobacco abuse 02/19/2012   Tobacco abuse    Vasovagal syncope 02/15/2015   Visual hallucination 12/04/2022   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 Merrilyn; took out 12 inches of small intestiines and removed blockage   COLONOSCOPY  2012   COLONOSCOPY WITH PROPOFOL  N/A 12/29/2022   Procedure: COLONOSCOPY WITH PROPOFOL ;  Surgeon: Eartha Angelia Sieving, MD;  Location: [facility];  Service: Gastroenterology;  Laterality: N/A;   ESOPHAGOGASTRODUODENOSCOPY (EGD) WITH PROPOFOL  N/A 12/28/2022   Procedure: ESOPHAGOGASTRODUODENOSCOPY (EGD) WITH PROPOFOL ;  Surgeon: Cinderella Deatrice FALCON, MD;  Location: [facility];  Service: Endoscopy;  Laterality: N/A;   EYE SURGERY     EYE SURGERY  06/29/2020   06-29-2020- RIGHT CATARACT REMOVED AND LEFT EYE SURGERY TO REMOVE SAND LIKE SUBSTANCE    FEMORAL ARTERY EXPLORATION Left 07/10/2020   Procedure: REDO LEFT FEMORAL ARTERY EXPOSURE;  Surgeon: Serene Gaile ORN, MD;  Location: [facility];  Service: Vascular;  Laterality: Left;   GIVENS CAPSULE STUDY N/A 12/30/2022   Procedure: GIVENS CAPSULE STUDY;  Surgeon: Eartha Angelia Sieving, MD;  Location: [facility];  Service: Gastroenterology;  Laterality: N/A;   LAPAROTOMY  02/12/2012   Procedure: EXPLORATORY LAPAROTOMY;  Surgeon: Jina Nephew, MD;  Location: [facility];  Service: General;  Laterality: N/A;  Exploratory Laparotomy, lysis of adhesions   SMALL INTESTINE SURGERY     THROMBECTOMY FEMORAL ARTERY Left 07/10/2020   Procedure: THROMBECTOMY AORTA-BIFEMORAL GRAFT, PROFUNDA, AND SUPERFICIAL FEMORAL ARTERY  LEFT LEG;  Surgeon: Serene Gaile ORN, MD;  Location: [facility];  Service: Vascular;  Laterality: Left;   Family History:  Family  History  Problem Relation Age of Onset   Kidney failure Mother    Diabetes Mother    Hypertension Mother    Heart disease Mother    Glaucoma Father    Congestive Heart Failure Sister    Diabetes Sister    Kidney disease Sister    Diabetes Brother    Unexplained death Brother 75       Automobile accident   Hypothyroidism Daughter    Arthritis Daughter        Neck/Back   Healthy Son    HIV/AIDS Brother    HIV Daughter    Kidney disease Daughter    Arthritis Son        Knee   Colon cancer Neg Hx    Colon polyps Neg Hx    Esophageal cancer Neg Hx    Rectal cancer Neg Hx    Stomach cancer Neg Hx  Social History:  Social History   Tobacco Use   Smoking status: Every Day    Current packs/day: 0.30    Average packs/day: 0.3 packs/day for 50.0 years (15.0 ttl pk-yrs)    Types: Cigarettes    Passive exposure: Current   Smokeless tobacco: Never   Tobacco comments:    5 cigs per day  Vaping Use   Vaping status: Never Used  Substance Use Topics   Alcohol use: No    Alcohol/week: 0.0 standard drinks of alcohol    Comment: last drink of alcohol ~ 1977   Drug use: No    Home Medications:  Prior to Admission medications   Medication Sig Start Date End Date Taking? Authorizing Provider  acyclovir  (ZOVIRAX ) 400 MG tablet Take 400 mg by mouth 3 (three) times daily as needed. 08/03/23   [provider]  alendronate  (FOSAMAX ) 70 MG tablet Take 1 tablet (70 mg total) by mouth every Sunday. TAKE 1 TABLET BY MOUTH ONCE WEEKLY ON SUNDAY take with a full glass of water on an empty stomach Strength: 70 mg 06/17/23   Trudy Mliss Dragon, MD  ALPRAZolam  (XANAX ) 0.5 MG tablet Take 1 tablet (0.5 mg total) by mouth at bedtime as needed for sleep or anxiety. 06/14/23 06/13/24  Trudy Mliss Dragon, MD  amLODipine  (NORVASC ) 10 MG tablet Take 10 mg by mouth daily. 07/04/23   [provider]  aspirin  EC 81 MG tablet Take 1 tablet (81 mg total) by mouth daily with breakfast. 06/14/23    Trudy Mliss Dragon, MD  atorvastatin  (LIPITOR) 10 MG tablet Take 1 tablet (10 mg total) by mouth at bedtime. 06/14/23   Trudy Mliss Dragon, MD  benazepril  (LOTENSIN ) 40 MG tablet Take 40 mg by mouth daily. 08/03/23   [provider]  bictegravir-emtricitabine -tenofovir  AF (BIKTARVY ) 50-200-25 MG TABS tablet Take 1 tablet by mouth daily. 08/14/23   Fleeta Kathie Jomarie LOISE, MD  brimonidine  (ALPHAGAN ) 0.2 % ophthalmic solution 1 drop 3 (three) times daily. 04/28/23   [provider]  clopidogrel  (PLAVIX ) 75 MG tablet Take 1 tablet (75 mg total) by mouth daily. 06/14/23   Trudy Mliss Dragon, MD  cyanocobalamin  (VITAMIN B12) 1000 MCG tablet Take 1 tablet (1,000 mcg total) by mouth daily. 04/04/23   Kandala, Hyndavi, MD  diclofenac  Sodium (VOLTAREN ) 1 % GEL Apply 2 g topically 4 (four) times daily. APPLY TWO GRAMS TO AFFECTED AREA(S) FOUR TIMES DAILY Strength: 1 % 10/31/22   Leopold Damien NOVAK, MD  DIPHENHYDRAMINE  HCL, TOPICAL, (BENADRYL  ITCH STOPPING) 2 % GEL Apply 1 Application topically every 4 (four) hours as needed (symptoms of bugs on scalp). 01/19/23   Leopold Damien NOVAK, MD  dorzolamide -timolol  (COSOPT ) 2-0.5 % ophthalmic solution Place 1 drop into the right eye 2 (two) times daily. 07/18/22   [provider]  ferrous sulfate  325 (65 FE) MG tablet Take 1 tablet (325 mg total) by mouth daily with breakfast. 01/01/23   Tat, Alm, MD  gabapentin  (NEURONTIN ) 300 MG capsule TAKE ONE CAPSULE BY MOUTH THREE TIMES DAILY (DOSE decrease) 09/24/23   Trudy Mliss Dragon, MD  latanoprost  (XALATAN ) 0.005 % ophthalmic solution 1 drop at bedtime. 07/04/23   [provider]  mirtazapine  (REMERON ) 7.5 MG tablet Take 1 tablet (7.5 mg total) by mouth at bedtime. 09/25/23   Trudy Mliss Dragon, MD  nicotine  (NICODERM CQ  - DOSED IN MG/24 HOURS) 14 mg/24hr patch Place 1 patch (14 mg total) onto the skin daily. 09/27/23 11/08/23  Amoako, Prince, MD  oxyCODONE -acetaminophen  (PERCOCET/ROXICET)  5-325 MG  tablet Take 1 tablet only for severe pain; as often as every 8 hours.  Use sparingly due to risk of falls. 11/05/23   Trudy Mliss Dragon, MD  polyethylene glycol (MIRALAX ) 17 g packet Take 17 g by mouth daily. 05/30/23   Masters, Izetta, DO  predniSONE  (DELTASONE ) 10 MG tablet Take as directed per MD instructions 08/08/23   Currieo, Andrew D, MD  senna (SENOKOT) 8.6 MG TABS tablet Take 1 tablet (8.6 mg total) by mouth daily. 05/30/23   Masters, Katie, DO  thiamine  (VITAMIN B1) 100 MG tablet Take 1 tablet (100 mg total) by mouth daily. 01/15/23   Leopold Damien NOVAK, MD  valACYclovir  (VALTREX ) 1000 MG tablet Take 1 tablet (1,000 mg total) by mouth 2 (two) times daily. 06/05/23   Fleeta Kathie Jomarie LOISE, MD    Inpatient Medications:  Current Facility-Administered Medications:    0.9 %  sodium chloride  infusion, , Intravenous, Continuous, Castaneda Mayorga, Nixon Kolton, MD   ALPRAZolam  (XANAX ) tablet 0.5 mg, 0.5 mg, Oral, QHS PRN, Stinson, Jacob J, DO, 0.5 mg at 11/02/23 2334   amLODipine  (NORVASC ) tablet 10 mg, 10 mg, Oral, Daily, Stinson, Jacob J, DO   atorvastatin  (LIPITOR) tablet 10 mg, 10 mg, Oral, QHS, Stinson, Jacob J, DO, 10 mg at 11/02/23 2325   benazepril  (LOTENSIN ) tablet 40 mg, 40 mg, Oral, Daily, Stinson, Jacob J, DO   bictegravir-emtricitabine -tenofovir  AF (BIKTARVY ) 50-200-25 MG per tablet 1 tablet, 1 tablet, Oral, Daily, Stinson, Jacob J, DO   gabapentin  (NEURONTIN ) capsule 300 mg, 300 mg, Oral, TID, Stinson, Jacob J, DO, 300 mg at 11/02/23 2325   hydrALAZINE  (APRESOLINE ) injection 10 mg, 10 mg, Intravenous, Q6H PRN, Adefeso, Oladapo, DO, 10 mg at 11/03/23 0149   HYDROcodone -acetaminophen  (NORCO/VICODIN) 5-325 MG per tablet 1 tablet, 1 tablet, Oral, Q6H PRN, Adefeso, Oladapo, DO, 1 tablet at 11/03/23 0247   lactated ringers  infusion, , Intravenous, Continuous, Stinson, Jacob J, DO, Last Rate: 100 mL/hr at 11/03/23 0532, New Bag at 11/03/23 0532   latanoprost  (XALATAN ) 0.005 % ophthalmic solution 1  drop, 1 drop, Both Eyes, QHS, Stinson, Jacob J, DO, 1 drop at 11/02/23 2322   mirtazapine  (REMERON ) tablet 7.5 mg, 7.5 mg, Oral, QHS, Stinson, Jacob J, DO, 7.5 mg at 11/02/23 2324   ondansetron  (ZOFRAN ) tablet 4 mg, 4 mg, Oral, Q6H PRN **OR** ondansetron  (ZOFRAN ) injection 4 mg, 4 mg, Intravenous, Q6H PRN, Stinson, Jacob J, DO   pantoprazole  (PROTONIX ) injection 40 mg, 40 mg, Intravenous, Q12H, Stinson, Jacob J, DO   potassium chloride  SA (KLOR-CON  M) CR tablet 40 mEq, 40 mEq, Oral, TID, Stinson, Jacob J, DO, 40 mEq at 11/02/23 2324 Allergies: Hctz [hydrochlorothiazide ]  Complete Review of Systems: GENERAL: negative for malaise, night sweats HEENT: No changes in hearing or vision, no nose bleeds or other nasal problems. NECK: Negative for lumps, goiter, pain and significant neck swelling RESPIRATORY: Negative for cough, wheezing CARDIOVASCULAR: Negative for chest pain, leg swelling, palpitations, orthopnea GI: SEE HPI MUSCULOSKELETAL: Negative for joint pain or swelling, back pain, and muscle pain. SKIN: Negative for lesions, rash PSYCH: Negative for sleep disturbance, mood disorder and recent psychosocial stressors. HEMATOLOGY Negative for prolonged bleeding, bruising easily, and swollen nodes. ENDOCRINE: Negative for cold or heat intolerance, polyuria, polydipsia and goiter. NEURO: negative for tremor, gait imbalance, syncope and seizures. The remainder of the review of systems is noncontributory.  Physical Exam: BP (!) 155/97 (BP Location: Left Arm)   Pulse 96   Temp 98.3 F (36.8 C) (Oral)  Resp 18   Ht 5' 7 (1.702 m)   Wt 51.9 kg   SpO2 97%   BMI 17.92 kg/m  GENERAL: The patient is AO x3, in no acute distress. HEENT: Head is normocephalic and atraumatic. EOMI are intact. Mouth is well hydrated and without lesions. NECK: Supple. No masses LUNGS: Clear to auscultation. No presence of rhonchi/wheezing/rales. Adequate chest expansion HEART: RRR, normal s1 and s2. ABDOMEN:  Soft, nontender, no guarding, no peritoneal signs, and nondistended. BS +. No masses. EXTREMITIES: Without any cyanosis, clubbing, rash, lesions or edema. NEUROLOGIC: AOx3, no focal motor deficit. SKIN: no jaundice, no rashes  Laboratory Data CBC:     Component Value Date/Time   WBC 6.4 11/03/2023 0357   RBC 3.70 (L) 11/03/2023 0357   HGB 12.0 11/03/2023 0357   HGB 12.9 12/30/2021 0946   HCT 36.7 11/03/2023 0357   HCT 37.6 12/30/2021 0946   PLT 279 11/03/2023 0357   PLT 318 12/30/2021 0946   MCV 99.2 11/03/2023 0357   MCV 97 12/30/2021 0946   MCH 32.4 11/03/2023 0357   MCHC 32.7 11/03/2023 0357   RDW 13.2 11/03/2023 0357   RDW 12.1 12/30/2021 0946   LYMPHSABS 1.4 11/02/2023 1726   LYMPHSABS 3.3 (H) 08/04/2020 1613   MONOABS 0.2 11/02/2023 1726   EOSABS 0.0 11/02/2023 1726   EOSABS 0.4 08/04/2020 1613   BASOSABS 0.0 11/02/2023 1726   BASOSABS 0.1 08/04/2020 1613   COAG:  Lab Results  Component Value Date   INR 1.2 07/10/2020   INR 0.97 02/08/2012   INR 1.1 05/08/2007    BMP:     Latest Ref Rng & Units 11/02/2023    5:26 PM 09/27/2023    4:26 PM 08/15/2023   11:31 AM  BMP  Glucose 70 - 99 mg/dL 70 - 99 mg/dL 878    875  889  99   BUN 8 - 23 mg/dL 8 - 23 mg/dL 10    12  17  21    Creatinine 0.44 - 1.00 mg/dL 9.55 - 8.99 mg/dL 9.29    9.22  9.13  9.01   Sodium 135 - 145 mmol/L 135 - 145 mmol/L 143    141  138  138   Potassium 3.5 - 5.1 mmol/L 3.5 - 5.1 mmol/L 2.7    2.7  3.4  3.0   Chloride 98 - 111 mmol/L 98 - 111 mmol/L 111    112  110  109   CO2 22 - 32 mmol/L 18  19  23    Calcium  8.9 - 10.3 mg/dL 8.2  9.3  9.5     HEPATIC:     Latest Ref Rng & Units 11/02/2023    5:26 PM 08/15/2023   11:31 AM 06/05/2023    9:58 AM  Hepatic Function  Total Protein 6.5 - 8.1 g/dL 6.0  7.2  6.9   Albumin  3.5 - 5.0 g/dL 3.2  3.6    AST 15 - 41 U/L 15  38  13   ALT 0 - 44 U/L 9  58  5   Alk Phosphatase 38 - 126 U/L 53  58    Total Bilirubin 0.0 - 1.2 mg/dL 0.7  <9.7   0.4     CARDIAC:  Lab Results  Component Value Date   CKTOTAL 27 (L) 12/04/2017   CKMB 6.4 (HH) 02/08/2012   TROPONINI <0.03 11/12/2017     Imaging: I personally reviewed and interpreted the available imaging  Assessment & Plan: Jessica [name]  Jessica [name] is a 83 y.o. female with history of blindness, anxiety, peripheral vascular disease, hypertension, HIV, hyperlipidemia, B12 and iron  deficiency, who came to the hospital for evaluation of possible melena. Patient had mild drop in hemoglobin and possible melena in PE yesterday. We will evaluate this with an EGD given concern for UGIB. Will keep PPI BID IV for now. Patient will be kept NPO.  Toribio Fortune, MD Gastroenterology and Hepatology [facility] Gastroenterology

## 2023-11-03 NOTE — Hospital Course (Addendum)
 83 year old female with a history of hypertension, peripheral artery disease, HIV, tobacco abuse, left eye blindness, hyperlipidemia, anxiety, tobacco abuse, blindness, B12 and iron  deficiency presenting with dark stools. Due to blindness, she was unaware of stools so is uncertain of how long this has been going on.  Her son went to work this morning.  When he got home and looked at her stools, he called EMS for her to come to the hospital.  Patient denies fevers, chills, headache, chest pain, dyspnea, nausea, vomiting, diarrhea, abdominal pain, dysuria.  In the ED, the patient was afebrile and hemodynamically stable with oxygen  saturation 96% room air.  WBC 5.7, hemoglobin 11.5, platelets 261.  BMP showed sodium 141, potassium 2.7, bicarbonate 18, serum creatinine 0.77.  LFTs unremarkable.  Magnesium  1.7. GI was consulted to assist with management.

## 2023-11-03 NOTE — Progress Notes (Addendum)
 PROGRESS NOTE  Jessica [name] FMW:[id number] DOB: 09-Apr-1940 DOA: 11/02/2023 PCP: Trudy Mliss Dragon, MD  Brief History:  83 year old female with a history of hypertension, peripheral artery disease, HIV, tobacco abuse, left eye blindness, hyperlipidemia, anxiety, tobacco abuse, blindness, B12 and iron  deficiency presenting with dark stools. Due to blindness, she was unaware of stools so is uncertain of how long this has been going on.  Her son went to work this morning.  When he got home and looked at her stools, he called EMS for her to come to the hospital.  Patient denies fevers, chills, headache, chest pain, dyspnea, nausea, vomiting, diarrhea, abdominal pain, dysuria.  In the ED, the patient was afebrile and hemodynamically stable with oxygen  saturation 96% room air.  WBC 5.7, hemoglobin 11.5, platelets 261.  BMP showed sodium 141, potassium 2.7, bicarbonate 18, serum creatinine 0.77.  LFTs unremarkable.  Magnesium  1.7. GI was consulted to assist with management.         Assessment/Plan: Dark stools/acute anemia/Hemoccult positive stool - Patient had hemoglobin 14.2 on 08/15/2023 -10/17 EGD--food in middle 1/3 esophagus--pushed into stomach; gastritis; no old or active bleed -10/18 colonoscopy--sigmoid and asc colon diverticulosis with scant blood tinged stool -capsule endoscopy 10/19>>normal - GI consult - Check anemia panel - Hemoccult positive in the ED - Remain n.p.o. for now pending GI consult  Hypokalemia -Repleted -Check magnesium  2.1   HIV disease -07/30/23 HIV RNA undetectable -07/30/23 CD4--1137/41% -Outpatient follow-up with ID -Continue Biktarvy    Mixed hyperlipidemia -Continue statin   Essential hypertension -Continue amlodipine  and benazepril    Anxiety -continue home dose alprazolam  -PDMP reviewed  - Alprazolam  0.5 mg, #30, last refill 10/01/2023 - Percocet 5/325, #60, last refill 10/08/2023  Opioid dependence - Continue Percocet  home dose  Hypokalemia - Replete - Magnesium  1.7  Ekbom's delusional parasitosis  -pt has persistent concern regarding forehead and scalp infestation with parasites -putting alcohol there to try to clean.  At times she pulls scab off       Family Communication:  no Family at bedside  Consultants:  none  Code Status:  FULL   DVT Prophylaxis:  SCDs   Procedures: As Listed in Progress Note Above  Antibiotics: None     Subjective: Patient denies fevers, chills, headache, chest pain, dyspnea, nausea, vomiting, diarrhea, abdominal pain, dysuria, hematuria, hematochezia, and melena.   Objective: Vitals:   11/03/23 0112 11/03/23 0247 11/03/23 0500 11/03/23 0800  BP: (!) 187/74 (!) 183/52 (!) 182/67 (!) 155/97  Pulse: 61 63 96   Resp: 18  18   Temp: 98.5 F (36.9 C)  98.3 F (36.8 C)   TempSrc: Oral  Oral   SpO2: 96%  97%   Weight:      Height:        Intake/Output Summary (Last 24 hours) at 11/03/2023 0907 Last data filed at 11/03/2023 9663 Gross per 24 hour  Intake 767 ml  Output --  Net 767 ml   Weight change:  Exam:  General:  Pt is alert, follows commands appropriately, not in acute distress HEENT: No icterus, No thrush, No neck mass, [place]/AT Cardiovascular: RRR, S1/S2, no rubs, no gallops Respiratory: CTA bilaterally, no wheezing, no crackles, no rhonchi Abdomen: Soft/+BS, non tender, non distended, no guarding Extremities: No edema, No lymphangitis, No petechiae, No rashes, no synovitis   Data Reviewed: I have personally reviewed following labs and imaging studies Basic Metabolic Panel: Recent Labs  Lab 11/02/23 1726 11/02/23  1830  NA 141  143  --   K 2.7*  2.7*  --   CL 112*  111  --   CO2 18*  --   GLUCOSE 124*  121*  --   BUN 12  10  --   CREATININE 0.77  0.70  --   CALCIUM  8.2*  --   MG  --  1.7   Liver Function Tests: Recent Labs  Lab 11/02/23 1726  AST 15  ALT 9  ALKPHOS 53  BILITOT 0.7  PROT 6.0*  ALBUMIN  3.2*   No  results for input(s): LIPASE, AMYLASE in the last 168 hours. No results for input(s): AMMONIA in the last 168 hours. Coagulation Profile: No results for input(s): INR, PROTIME in the last 168 hours. CBC: Recent Labs  Lab 11/02/23 1726 11/02/23 2240 11/03/23 0357  WBC 5.7 6.8 6.4  NEUTROABS 4.0  --   --   HGB 11.5*  11.2* 11.9* 12.0  HCT 34.1*  33.0* 36.2 36.7  MCV 99.7 100.6* 99.2  PLT 261 276 279   Cardiac Enzymes: No results for input(s): CKTOTAL, CKMB, CKMBINDEX, TROPONINI in the last 168 hours. BNP: Invalid input(s): POCBNP CBG: Recent Labs  Lab 11/03/23 0718  GLUCAP 80   HbA1C: No results for input(s): HGBA1C in the last 72 hours. Urine analysis:    Component Value Date/Time   COLORURINE YELLOW 06/06/2023 1344   APPEARANCEUR CLEAR 06/06/2023 1344   APPEARANCEUR Clear 01/04/2018 1125   LABSPEC 1.018 06/06/2023 1344   PHURINE 5.0 06/06/2023 1344   GLUCOSEU NEGATIVE 06/06/2023 1344   GLUCOSEU NEG mg/dL 94/94/7988 7974   HGBUR SMALL (A) 06/06/2023 1344   HGBUR trace-intact 01/15/2008 1032   BILIRUBINUR NEGATIVE 06/06/2023 1344   BILIRUBINUR negative 09/27/2022 1632   BILIRUBINUR Negative 01/04/2018 1125   KETONESUR 20 (A) 06/06/2023 1344   PROTEINUR 30 (A) 06/06/2023 1344   UROBILINOGEN 1.0 09/27/2022 1632   UROBILINOGEN 0.2 09/03/2014 2205   NITRITE NEGATIVE 06/06/2023 1344   LEUKOCYTESUR LARGE (A) 06/06/2023 1344   Sepsis Labs: @LABRCNTIP (procalcitonin:4,lacticidven:4) )No results found for this or any previous visit (from the past 240 hours).   Scheduled Meds:  amLODipine   10 mg Oral Daily   atorvastatin   10 mg Oral QHS   benazepril   40 mg Oral Daily   bictegravir-emtricitabine -tenofovir  AF  1 tablet Oral Daily   gabapentin   300 mg Oral TID   latanoprost   1 drop Both Eyes QHS   mirtazapine   7.5 mg Oral QHS   pantoprazole  (PROTONIX ) IV  40 mg Intravenous Q12H   potassium chloride   40 mEq Oral TID   Continuous Infusions:   sodium chloride      lactated ringers  100 mL/hr at 11/03/23 0532    Procedures/Studies: No results found.  Alm Schneider, DO  [facility]  If 7PM-7AM, please contact night-coverage [email or web address] Password [email or web address] 11/03/2023, 9:07 AM   LOS: 0 days

## 2023-11-03 NOTE — Transfer of Care (Signed)
 Immediate Anesthesia Transfer of Care Note  Patient: Jessica [name]  Procedure(s) Performed: EGD (ESOPHAGOGASTRODUODENOSCOPY)  Patient Location: PACU  Anesthesia Type:General  Level of Consciousness: drowsy  Airway & Oxygen  Therapy: Patient Spontanous Breathing  Post-op Assessment: Report given to RN and Post -op Vital signs reviewed and stable  Post vital signs: Reviewed and stable  Last Vitals:  Vitals Value Taken Time  BP 105/42   Temp 98   Pulse 70   Resp 19   SpO2 95     Last Pain:  Vitals:   11/03/23 0500  TempSrc: Oral  PainSc:          Complications: No notable events documented.

## 2023-11-03 NOTE — Discharge Summary (Signed)
 Physician Discharge Summary   Patient: Jessica [name] MRN: [id number] DOB: 05-22-1940  Admit date:     11/02/2023  Discharge date: 11/03/23  Discharge Physician: Alm Camry Robello   PCP: Trudy Mliss Dragon, MD   Recommendations at discharge:   Please follow up with primary care provider within 1-2 weeks  Please repeat BMP and CBC in one week   Hospital Course: 83 year old female with a history of hypertension, peripheral artery disease, HIV, tobacco abuse, left eye blindness, hyperlipidemia, anxiety, tobacco abuse, blindness, B12 and iron  deficiency presenting with dark stools. Due to blindness, she was unaware of stools so is uncertain of how long this has been going on.  Her son went to work this morning.  When he got home and looked at her stools, he called EMS for her to come to the hospital.  Patient denies fevers, chills, headache, chest pain, dyspnea, nausea, vomiting, diarrhea, abdominal pain, dysuria.  In the ED, the patient was afebrile and hemodynamically stable with oxygen  saturation 96% room air.  WBC 5.7, hemoglobin 11.5, platelets 261.  BMP showed sodium 141, potassium 2.7, bicarbonate 18, serum creatinine 0.77.  LFTs unremarkable.  Magnesium  1.7. GI was consulted to assist with management.        Assessment and Plan:  Dark stools/acute anemia/Hemoccult positive stool - Patient had hemoglobin 14.2 on 08/15/2023 -10/17 EGD--food in middle 1/3 esophagus--pushed into stomach; gastritis; no old or active bleed -10/18 colonoscopy--sigmoid and asc colon diverticulosis with scant blood tinged stool -capsule endoscopy 10/19>>normal - GI consult appreciated - 11/03/23 EGD--normal esophagus, stomach, duodenum - iron  saturation 12, ferritin 47, B12--385, folate 14.0 - Hemoccult positive in the ED - pt normally takes iron  at home;  GI doubt pt had GI bleed - venofer  x 1 given, B12 injection x 1 given - cleared for d/c by GI   Hypokalemia -Repleted -Check magnesium  2.1    HIV disease -07/30/23 HIV RNA undetectable -07/30/23 CD4--1137/41% -Outpatient follow-up with ID -Continue Biktarvy    Mixed hyperlipidemia -Continue statin   Essential hypertension -Continue amlodipine  and benazepril    Anxiety -continue home dose alprazolam  -PDMP reviewed  - Alprazolam  0.5 mg, #30, last refill 10/01/2023 - Percocet 5/325, #60, last refill 10/08/2023   Opioid dependence - Continue Percocet home dose   Hypokalemia - Replete - Magnesium  1.7   Ekbom's delusional parasitosis  -pt has persistent concern regarding forehead and scalp infestation with parasites -putting alcohol there to try to clean.  At times she pulls scab off        Consultants: GI Procedures performed: EGD  Disposition: Home Diet recommendation:  Cardiac diet DISCHARGE MEDICATION: Allergies as of 11/03/2023       Reactions   Hctz [hydrochlorothiazide ] Other (See Comments)   Dizziness, syncope; does NOT wish to take anymore        Medication List     STOP taking these medications    predniSONE  10 MG tablet Commonly known as: DELTASONE    valACYclovir  1000 MG tablet Commonly known as: Valtrex        TAKE these medications    acyclovir  400 MG tablet Commonly known as: ZOVIRAX  Take 400 mg by mouth 3 (three) times daily as needed.   alendronate  70 MG tablet Commonly known as: FOSAMAX  Take 1 tablet (70 mg total) by mouth every Sunday. TAKE 1 TABLET BY MOUTH ONCE WEEKLY ON SUNDAY take with a full glass of water on an empty stomach Strength: 70 mg   ALPRAZolam  0.5 MG tablet Commonly known as: Xanax  Take 1 tablet (  0.5 mg total) by mouth at bedtime as needed for sleep or anxiety.   amLODipine  10 MG tablet Commonly known as: NORVASC  Take 10 mg by mouth daily.   aspirin  EC 81 MG tablet Take 1 tablet (81 mg total) by mouth daily with breakfast.   atorvastatin  10 MG tablet Commonly known as: LIPITOR Take 1 tablet (10 mg total) by mouth at bedtime.   Benadryl  Itch Stopping  2 % Gel Generic drug: DIPHENHYDRAMINE  HCL (TOPICAL) Apply 1 Application topically every 4 (four) hours as needed (symptoms of bugs on scalp).   benazepril  40 MG tablet Commonly known as: LOTENSIN  Take 40 mg by mouth daily.   Biktarvy  50-200-25 MG Tabs tablet Generic drug: bictegravir-emtricitabine -tenofovir  AF Take 1 tablet by mouth daily.   brimonidine  0.2 % ophthalmic solution Commonly known as: ALPHAGAN  1 drop 3 (three) times daily.   clopidogrel  75 MG tablet Commonly known as: PLAVIX  Take 1 tablet (75 mg total) by mouth daily.   cyanocobalamin  1000 MCG tablet Commonly known as: VITAMIN B12 Take 1 tablet (1,000 mcg total) by mouth daily.   diclofenac  Sodium 1 % Gel Commonly known as: VOLTAREN  Apply 2 g topically 4 (four) times daily. APPLY TWO GRAMS TO AFFECTED AREA(S) FOUR TIMES DAILY Strength: 1 %   dorzolamide -timolol  2-0.5 % ophthalmic solution Commonly known as: COSOPT  Place 1 drop into the right eye 2 (two) times daily.   ferrous sulfate  325 (65 FE) MG tablet Take 1 tablet (325 mg total) by mouth daily with breakfast.   gabapentin  300 MG capsule Commonly known as: NEURONTIN  TAKE ONE CAPSULE BY MOUTH THREE TIMES DAILY (DOSE decrease)   latanoprost  0.005 % ophthalmic solution Commonly known as: XALATAN  1 drop at bedtime.   mirtazapine  7.5 MG tablet Commonly known as: REMERON  Take 1 tablet (7.5 mg total) by mouth at bedtime.   nicotine  14 mg/24hr patch Commonly known as: NICODERM CQ  - dosed in mg/24 hours Place 1 patch (14 mg total) onto the skin daily.   oxyCODONE -acetaminophen  5-325 MG tablet Commonly known as: PERCOCET/ROXICET Take 1 tablet only for severe pain; as often as every 8 hours.  Use sparingly due to risk of falls. Start taking on: November 05, 2023   polyethylene glycol 17 g packet Commonly known as: MiraLax  Take 17 g by mouth daily.   senna 8.6 MG Tabs tablet Commonly known as: SENOKOT Take 1 tablet (8.6 mg total) by mouth daily.    thiamine  100 MG tablet Commonly known as: VITAMIN B1 Take 1 tablet (100 mg total) by mouth daily.        Discharge Exam: Filed Weights   11/02/23 1711 11/02/23 1928  Weight: 57.2 kg 51.9 kg   HEENT:  Fate/AT, No thrush, no icterus CV:  RRR, no rub, no S3, no S4 Lung:  CTA, no wheeze, no rhonchi Abd:  soft/+BS, NT Ext:  No edema, no lymphangitis, no synovitis, no rash   Condition at discharge: stable  The results of significant diagnostics from this hospitalization (including imaging, microbiology, ancillary and laboratory) are listed below for reference.   Imaging Studies: No results found.  Microbiology: Results for orders placed or performed during the hospital encounter of 06/06/23  Urine Culture     Status: Abnormal   Collection Time: 06/06/23  1:44 PM   Specimen: Urine, Random  Result Value Ref Range Status   Specimen Description   Final    URINE, RANDOM Performed at [facility], [address] [place], [place] [address]    Special Requests  Final    NONE Reflexed from [id number] Performed at [facility], [address] [place], [place] [address]    Culture 30,000 COLONIES/mL ENTEROCOCCUS FAECALIS (A)  Final   Report Status 06/08/2023 FINAL  Final   Organism ID, Bacteria ENTEROCOCCUS FAECALIS (A)  Final      Susceptibility   Enterococcus faecalis - MIC*    AMPICILLIN <=2 SENSITIVE Sensitive     NITROFURANTOIN  <=16 SENSITIVE Sensitive     VANCOMYCIN 1 SENSITIVE Sensitive     * 30,000 COLONIES/mL ENTEROCOCCUS FAECALIS   *Note: Due to a large number of results and/or encounters for the requested time period, some results have not been displayed. A complete set of results can be found in Results Review.    Labs: CBC: Recent Labs  Lab 11/02/23 1726 11/02/23 2240 11/03/23 0357  WBC 5.7 6.8 6.4  NEUTROABS 4.0  --   --   HGB 11.5*  11.2* 11.9* 12.0  HCT 34.1*  33.0* 36.2 36.7  MCV 99.7 100.6* 99.2  PLT 261 276 279   Basic Metabolic Panel: Recent Labs   Lab 11/02/23 1726 11/02/23 1830 11/03/23 1002  NA 141  143  --  139  K 2.7*  2.7*  --  3.8  CL 112*  111  --  113*  CO2 18*  --  18*  GLUCOSE 124*  121*  --  78  BUN 12  10  --  8  CREATININE 0.77  0.70  --  0.63  CALCIUM  8.2*  --  8.3*  MG  --  1.7  --    Liver Function Tests: Recent Labs  Lab 11/02/23 1726  AST 15  ALT 9  ALKPHOS 53  BILITOT 0.7  PROT 6.0*  ALBUMIN  3.2*   CBG: Recent Labs  Lab 11/03/23 0718 11/03/23 1105  GLUCAP 80 107*    Discharge time spent: greater than 30 minutes.  Signed: Alm Schneider, MD [facility] 11/03/2023

## 2023-11-03 NOTE — Brief Op Note (Addendum)
 11/03/2023  9:38 AM  PATIENT:  Jessica [name]  83 y.o. female  PRE-OPERATIVE DIAGNOSIS:  melena  POST-OPERATIVE DIAGNOSIS:  * No post-op diagnosis entered *  PROCEDURE:  Procedure(s): EGD (ESOPHAGOGASTRODUODENOSCOPY) (N/A)  SURGEON:  Surgeons and Role:    * Eartha Angelia Sieving, MD - Primary  Patient underwent EGD under propofol  sedation.  Tolerated the procedure adequately.   FINDINGS: - Normal esophagus.  - Normal stomach.  Biopsied.  - Normal examined duodenum.   RECOMMENDATIONS - Return patient to hospital ward for possible discharge same day.  - Resume previous diet.  - Await pathology results.  - Continue pantoprazole  40 mg qday. - GI service will sign-off, please call us  back if you have any more questions.  Sieving Eartha, MD Gastroenterology and Hepatology [facility]enterology

## 2023-11-05 ENCOUNTER — Telehealth: Payer: Self-pay

## 2023-11-05 ENCOUNTER — Encounter (HOSPITAL_COMMUNITY): Payer: Self-pay | Admitting: Gastroenterology

## 2023-11-05 NOTE — Transitions of Care (Post Inpatient/ED Visit) (Signed)
 11/05/2023  Name: Jessica [name] MRN: [id number] DOB: Sep 10, 1940  Today's TOC FU Call Status: Today's TOC FU Call Status:: Successful TOC FU Call Completed TOC FU Call Complete Date: 11/05/23 Patient's Name and Date of Birth confirmed.  Transition Care Management Follow-up Telephone Call Date of Discharge: 11/03/23 [facility] Type of Discharge: Inpatient Admission Primary Inpatient Discharge Diagnosis:: GI bleed How have you been since you were released from the hospital?: Better Any questions or concerns?: No  Items Reviewed: Did you receive and understand the discharge instructions provided?: Yes Medications obtained,verified, and reconciled?: Yes (Medications Reviewed) Any new allergies since your discharge?: No Dietary orders reviewed?: Yes Do you have support at home?: Yes People in Home [RPT]: child(ren), adult  Medications Reviewed Today: Medications Reviewed Today     Reviewed by Emmitt Pan, LPN (Licensed Practical Nurse) on 11/05/23 at 1355  Med List Status: <None>   Medication Order Taking? Sig Documenting Provider Last Dose Status Informant  acyclovir  (ZOVIRAX ) 400 MG tablet [id number] Yes Take 400 mg by mouth 3 (three) times daily as needed. [provider]  Active   alendronate  (FOSAMAX ) 70 MG tablet [id number] Yes Take 1 tablet (70 mg total) by mouth every Sunday. TAKE 1 TABLET BY MOUTH ONCE WEEKLY ON SUNDAY take with a full glass of water on an empty stomach Strength: 70 mg Trudy Mliss Dragon, MD  Active   ALPRAZolam  (XANAX ) 0.5 MG tablet [id number] Yes Take 1 tablet (0.5 mg total) by mouth at bedtime as needed for sleep or anxiety. Trudy Mliss Dragon, MD  Active   amLODipine  (NORVASC ) 10 MG tablet [id number] Yes Take 10 mg by mouth daily. [provider]  Active   aspirin  EC 81 MG tablet [id number] Yes Take 1 tablet (81 mg total) by mouth daily with breakfast. Trudy Mliss Dragon, MD  Active   atorvastatin   (LIPITOR) 10 MG tablet [id number] Yes Take 1 tablet (10 mg total) by mouth at bedtime. Trudy Mliss Dragon, MD  Active   benazepril  (LOTENSIN ) 40 MG tablet [id number] Yes Take 40 mg by mouth daily. [provider]  Active   bictegravir-emtricitabine -tenofovir  AF (BIKTARVY ) 50-200-25 MG TABS tablet [id number] Yes Take 1 tablet by mouth daily. Fleeta Rothman, Jomarie SAILOR, MD  Active   brimonidine  (ALPHAGAN ) 0.2 % ophthalmic solution [id number] Yes 1 drop 3 (three) times daily. [provider]  Active   clopidogrel  (PLAVIX ) 75 MG tablet [id number] Yes Take 1 tablet (75 mg total) by mouth daily. Trudy Mliss Dragon, MD  Active   cyanocobalamin  (VITAMIN B12) 1000 MCG tablet [id number] Yes Take 1 tablet (1,000 mcg total) by mouth daily. Kandala, Hyndavi, MD  Active   diclofenac  Sodium (VOLTAREN ) 1 % GEL [id number] Yes Apply 2 g topically 4 (four) times daily. APPLY TWO GRAMS TO AFFECTED AREA(S) FOUR TIMES DAILY Strength: 1 % Leopold Damien NOVAK, MD  Active Family Member  DIPHENHYDRAMINE  HCL, TOPICAL, (BENADRYL  ITCH STOPPING) 2 % GEL [id number] Yes Apply 1 Application topically every 4 (four) hours as needed (symptoms of bugs on scalp). Leopold Damien NOVAK, MD  Active   dorzolamide -timolol  (COSOPT ) 2-0.5 % ophthalmic solution [id number] Yes Place 1 drop into the right eye 2 (two) times daily. [provider]  Active Family Member  ferrous sulfate  325 (65 FE) MG tablet [id number] Yes Take 1 tablet (325 mg total) by mouth daily with breakfast. Tat, Alm, MD  Active   gabapentin  (NEURONTIN ) 300 MG capsule [id number] Yes TAKE ONE CAPSULE BY MOUTH THREE TIMES DAILY (DOSE  decrease) Trudy Mliss Dragon, MD  Active   latanoprost  (XALATAN ) 0.005 % ophthalmic solution [id number] Yes 1 drop at bedtime. [provider]  Active   mirtazapine  (REMERON ) 7.5 MG tablet [id number] Yes Take 1 tablet (7.5 mg total) by mouth at bedtime. Trudy Mliss Dragon, MD  Active   nicotine  (NICODERM CQ  - DOSED IN MG/24 HOURS) 14  mg/24hr patch [id number] Yes Place 1 patch (14 mg total) onto the skin daily. Amoako, Prince, MD  Active   oxyCODONE -acetaminophen  (PERCOCET/ROXICET) 5-325 MG tablet [id number] Yes Take 1 tablet only for severe pain; as often as every 8 hours.  Use sparingly due to risk of falls. Trudy Mliss Dragon, MD  Active   polyethylene glycol (MIRALAX ) 17 g packet [id number] Yes Take 17 g by mouth daily. Masters, Izetta, DO  Active   senna (SENOKOT) 8.6 MG TABS tablet [id number] Yes Take 1 tablet (8.6 mg total) by mouth daily. Masters, Izetta, DO  Active   thiamine  (VITAMIN B1) 100 MG tablet [id number] Yes Take 1 tablet (100 mg total) by mouth daily. Leopold Damien NOVAK, MD  Active             Home Care and Equipment/Supplies: Were Home Health Services Ordered?: NA Any new equipment or medical supplies ordered?: NA  Functional Questionnaire: Do you need assistance with bathing/showering or dressing?: Yes Do you need assistance with meal preparation?: Yes Do you need assistance with eating?: No Do you have difficulty maintaining continence: No Do you need assistance with getting out of bed/getting out of a chair/moving?: Yes Do you have difficulty managing or taking your medications?: No  Follow up appointments reviewed: PCP Follow-up appointment confirmed?: No (declined) MD Provider Line [phone] Given: No Specialist Hospital Follow-up appointment confirmed?: Yes Date of Specialist follow-up appointment?: 11/01/23 Follow-Up Specialty Provider:: ortho Do you need transportation to your follow-up appointment?: No Do you understand care options if your condition(s) worsen?: Yes-patient verbalized understanding    SIGNATURE Julian Lemmings, LPN [facility] Nurse Health Advisor Direct Dial [phone]

## 2023-11-06 ENCOUNTER — Other Ambulatory Visit: Payer: Self-pay | Admitting: *Deleted

## 2023-11-06 ENCOUNTER — Ambulatory Visit: Payer: Self-pay | Admitting: Gastroenterology

## 2023-11-06 DIAGNOSIS — F5102 Adjustment insomnia: Secondary | ICD-10-CM

## 2023-11-06 DIAGNOSIS — F411 Generalized anxiety disorder: Secondary | ICD-10-CM

## 2023-11-06 LAB — SURGICAL PATHOLOGY

## 2023-11-08 ENCOUNTER — Encounter: Payer: Self-pay | Admitting: Family Medicine

## 2023-11-08 ENCOUNTER — Ambulatory Visit (INDEPENDENT_AMBULATORY_CARE_PROVIDER_SITE_OTHER): Admitting: Family Medicine

## 2023-11-08 VITALS — BP 150/68 | Ht 66.0 in | Wt 126.0 lb

## 2023-11-08 DIAGNOSIS — M1612 Unilateral primary osteoarthritis, left hip: Secondary | ICD-10-CM | POA: Diagnosis not present

## 2023-11-08 DIAGNOSIS — M25562 Pain in left knee: Secondary | ICD-10-CM | POA: Diagnosis not present

## 2023-11-08 MED ORDER — METHYLPREDNISOLONE ACETATE 40 MG/ML IJ SUSP
80.0000 mg | Freq: Once | INTRAMUSCULAR | Status: AC
  Administered 2023-11-08: 80 mg via INTRAMUSCULAR

## 2023-11-08 NOTE — Assessment & Plan Note (Signed)
 Acute on chronic left hip pain with known osteoarthritis  Plan: - Has done well with IM steroid injections in the past.  Was given Depo-Medrol  80 mg IM today. - Can continue her Percocet as prescribed - Can use intermittent Tylenol , did discuss dosing recommendations since she is taking Percocet as well - Can continue activity as tolerated - Follow-up if worsening or not improving

## 2023-11-08 NOTE — Progress Notes (Signed)
 DATE OF VISIT: 11/08/2023        Jessica [name] DOB: 08-13-1940 MRN: [id number]  CC:  f/u Lt hip pain, also Lt knee pain  History of present Illness: Jessica [name] is a 83 y.o. female who presents for a follow-up visit for left hip pain.  She has known hip osteoarthritis.  Previous cortisone injection in early May, but only had approximately 2 to 3 weeks of improvement.  Has done well with IM Depo-Medrol  injections, last 08/08/2023.  She reports Depo injections will often last for several months.  She has been starting to experience some increasing left hip pain and is interested in repeat IM injection today  She is also complaining of some increasing left knee pain.  No prior knee x-rays.  She is taking Percocet 3 times daily which does help her pain She will occasionally take Tylenol  extra strength 1-2 times a month in addition.  Medications:  Outpatient Encounter Medications as of 11/08/2023  Medication Sig   acyclovir  (ZOVIRAX ) 400 MG tablet Take 400 mg by mouth 3 (three) times daily as needed.   alendronate  (FOSAMAX ) 70 MG tablet Take 1 tablet (70 mg total) by mouth every Sunday. TAKE 1 TABLET BY MOUTH ONCE WEEKLY ON SUNDAY take with a full glass of water on an empty stomach Strength: 70 mg   ALPRAZolam  (XANAX ) 0.5 MG tablet Take 1 tablet (0.5 mg total) by mouth at bedtime as needed for sleep or anxiety.   amLODipine  (NORVASC ) 10 MG tablet Take 10 mg by mouth daily.   aspirin  EC 81 MG tablet Take 1 tablet (81 mg total) by mouth daily with breakfast.   atorvastatin  (LIPITOR) 10 MG tablet Take 1 tablet (10 mg total) by mouth at bedtime.   benazepril  (LOTENSIN ) 40 MG tablet Take 40 mg by mouth daily.   bictegravir-emtricitabine -tenofovir  AF (BIKTARVY ) 50-200-25 MG TABS tablet Take 1 tablet by mouth daily.   brimonidine  (ALPHAGAN ) 0.2 % ophthalmic solution 1 drop 3 (three) times daily.   clopidogrel  (PLAVIX ) 75 MG tablet Take 1 tablet (75 mg total) by mouth daily.    cyanocobalamin  (VITAMIN B12) 1000 MCG tablet Take 1 tablet (1,000 mcg total) by mouth daily.   diclofenac  Sodium (VOLTAREN ) 1 % GEL Apply 2 g topically 4 (four) times daily. APPLY TWO GRAMS TO AFFECTED AREA(S) FOUR TIMES DAILY Strength: 1 %   DIPHENHYDRAMINE  HCL, TOPICAL, (BENADRYL  ITCH STOPPING) 2 % GEL Apply 1 Application topically every 4 (four) hours as needed (symptoms of bugs on scalp).   dorzolamide -timolol  (COSOPT ) 2-0.5 % ophthalmic solution Place 1 drop into the right eye 2 (two) times daily.   ferrous sulfate  325 (65 FE) MG tablet Take 1 tablet (325 mg total) by mouth daily with breakfast.   gabapentin  (NEURONTIN ) 300 MG capsule TAKE ONE CAPSULE BY MOUTH THREE TIMES DAILY (DOSE decrease)   latanoprost  (XALATAN ) 0.005 % ophthalmic solution 1 drop at bedtime.   mirtazapine  (REMERON ) 7.5 MG tablet Take 1 tablet (7.5 mg total) by mouth at bedtime.   nicotine  (NICODERM CQ  - DOSED IN MG/24 HOURS) 14 mg/24hr patch Place 1 patch (14 mg total) onto the skin daily.   oxyCODONE -acetaminophen  (PERCOCET/ROXICET) 5-325 MG tablet Take 1 tablet only for severe pain; as often as every 8 hours.  Use sparingly due to risk of falls.   polyethylene glycol (MIRALAX ) 17 g packet Take 17 g by mouth daily.   senna (SENOKOT) 8.6 MG TABS tablet Take 1 tablet (8.6 mg total) by mouth daily.  thiamine  (VITAMIN B1) 100 MG tablet Take 1 tablet (100 mg total) by mouth daily.   [EXPIRED] methylPREDNISolone  acetate (DEPO-MEDROL ) injection 80 mg    No facility-administered encounter medications on file as of 11/08/2023.    Allergies: is allergic to hctz [hydrochlorothiazide ].  Physical Examination: Vitals: BP (!) 150/68   Ht 5' 6 (1.676 m)   Wt 126 lb (57.2 kg)   BMI 20.34 kg/m  GENERAL:  Jessica [name] is a 83 y.o. female appearing their stated age, alert and oriented x 3, in no apparent distress.  SKIN: no rashes or lesions, skin clean, dry, intact MSK: Knee: Left knee with trace effusion.  Good range  of motion with 1-2+ patellofemoral crepitus.  Has mild medial joint line tenderness.  No lateral joint line tenderness.  Negative McMurray.  No ligamentous laxity.  Right knee with good range of motion without pain. Hips: Left hip with decreased range of motion with pain. Ambulating with the assistance of a cane Neurovascular intact distally  Assessment & Plan Primary osteoarthritis of left hip Acute on chronic left hip pain with known osteoarthritis  Plan: - Has done well with IM steroid injections in the past.  Was given Depo-Medrol  80 mg IM today. - Can continue her Percocet as prescribed - Can use intermittent Tylenol , did discuss dosing recommendations since she is taking Percocet as well - Can continue activity as tolerated - Follow-up if worsening or not improving Acute pain of left knee Acute left knee pain, suspect underlying osteoarthritis that is likely exacerbated from altered gait related to her hip arthritis  Plan: - Diagnosis and treatment discussed - Did offer to obtain x-ray of the left knee, but she would like to defer at this time.  Will consider if getting worse - Was given Depo-Medrol  80 mg IM which should help with the knee as well as the hip - She will continue her Percocet as noted above - Will follow-up with us  on an as needed basis, if worsening will consider imaging with x-ray to rule out arthritis.   Patient expressed understanding & agreement with above.  Encounter Diagnosis  Name Primary?   Primary osteoarthritis of left hip Yes    No orders of the defined types were placed in this encounter.

## 2023-11-09 MED ORDER — ALPRAZOLAM 0.5 MG PO TABS: 0.5000 mg | ORAL_TABLET | Freq: Every evening | ORAL | 3 refills | Status: DC | PRN

## 2023-11-10 NOTE — Anesthesia Postprocedure Evaluation (Signed)
 Anesthesia Post Note  Patient: Jessica [name]  Procedure(s) Performed: EGD (ESOPHAGOGASTRODUODENOSCOPY)  Patient location during evaluation: Phase II Anesthesia Type: General Level of consciousness: awake Pain management: pain level controlled Vital Signs Assessment: post-procedure vital signs reviewed and stable Respiratory status: spontaneous breathing and respiratory function stable Cardiovascular status: blood pressure returned to baseline and stable Postop Assessment: no headache and no apparent nausea or vomiting Anesthetic complications: no Comments: Late entry   No notable events documented.   Last Vitals:  Vitals:   11/03/23 0945 11/03/23 1425  BP: (!) 154/79 (!) 99/46  Pulse: 65 (!) 53  Resp: 20 16  Temp:  36.8 C  SpO2: 98% 95%    Last Pain:  Vitals:   11/03/23 1425  TempSrc: Oral  PainSc:                  [name]

## 2023-11-14 ENCOUNTER — Telehealth: Payer: Self-pay | Admitting: *Deleted

## 2023-11-14 NOTE — Telephone Encounter (Signed)
 Return call to Gabriel,radiology - wanted to be sure the doctor is awred of EXAM: CT CHEST WITHOUT CONTRAST FOR LUNG CANCER SCREENING NODULE FOLLOW-UP Final result is in EPIC.

## 2023-11-14 NOTE — Telephone Encounter (Signed)
 Copied from CRM #[id number]. Topic: Clinical - Lab/Test Results >> Nov 14, 2023 12:55 PM Carmell SAUNDERS wrote: Reason for CRM: Aloha calling from the radiology reading room. Needs a nurse to contact him back to get the report on this patient's results. It could possibly have some cancerous results. [phone].

## 2023-11-15 ENCOUNTER — Other Ambulatory Visit: Payer: Self-pay | Admitting: Internal Medicine

## 2023-11-15 DIAGNOSIS — R911 Solitary pulmonary nodule: Secondary | ICD-10-CM

## 2023-11-15 NOTE — Telephone Encounter (Signed)
 Spoke to Ms. Manns who wishes to proceed with pulmonary referral for additional diagnostics and planning. Referral placed.

## 2023-11-19 ENCOUNTER — Encounter (INDEPENDENT_AMBULATORY_CARE_PROVIDER_SITE_OTHER): Payer: Self-pay | Admitting: *Deleted

## 2023-11-19 NOTE — Assessment & Plan Note (Signed)
>>  ASSESSMENT AND PLAN FOR SMOKING 1/2 PACK A DAY OR LESS WRITTEN ON 11/19/2023  4:19 PM BY Shaunee Mulkern ANNE, MD  Has reduced to 1/2 ppd and desires lower dose patches.  Ordered.

## 2023-11-19 NOTE — Assessment & Plan Note (Signed)
 111/54 on current regimen. No positional dizziness.  Continue.

## 2023-11-19 NOTE — Assessment & Plan Note (Signed)
 Has reduced to 1/2 ppd and desires lower dose patches.  Ordered.

## 2023-11-19 NOTE — Progress Notes (Signed)
 3 yr EGD noted in recall Patient result letter mailed

## 2023-11-22 ENCOUNTER — Ambulatory Visit: Admitting: Internal Medicine

## 2023-11-22 VITALS — BP 239/83 | HR 54 | Temp 97.5°F | Wt 113.0 lb

## 2023-11-22 DIAGNOSIS — K921 Melena: Secondary | ICD-10-CM

## 2023-11-22 DIAGNOSIS — R911 Solitary pulmonary nodule: Secondary | ICD-10-CM

## 2023-11-22 DIAGNOSIS — Z8719 Personal history of other diseases of the digestive system: Secondary | ICD-10-CM

## 2023-11-22 DIAGNOSIS — Z634 Disappearance and death of family member: Secondary | ICD-10-CM

## 2023-11-22 DIAGNOSIS — F4321 Adjustment disorder with depressed mood: Secondary | ICD-10-CM | POA: Diagnosis not present

## 2023-11-22 DIAGNOSIS — R634 Abnormal weight loss: Secondary | ICD-10-CM | POA: Diagnosis not present

## 2023-11-22 DIAGNOSIS — K922 Gastrointestinal hemorrhage, unspecified: Secondary | ICD-10-CM

## 2023-11-22 DIAGNOSIS — F22 Delusional disorders: Secondary | ICD-10-CM

## 2023-11-22 NOTE — Assessment & Plan Note (Addendum)
 After successful weight gain with increased calories, she has again lost, due to new grief.  She does have food available but will need to be intentional about eating enough to sustain herself.  She is already very frail. Has been taking mirtazapine  7.5 nightly prior to this.

## 2023-11-22 NOTE — Progress Notes (Unsigned)
 83 y.o. Jessica [name] is here for hospital f/u, and to discuss the recent unexpected death of her daughter/advocate (acute illness), as well as the finding of a lung nodule on her recent lung cancer screening CT.  She is accompanied by her son Jessica [name], who is stepping into the role of caregiver (though he doesn't live with her) for his mother.   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   Her son had noted the color of her stools to be very dark when he checked on her after work, and called EMS.  On arrival her hemoglobin was 11.5 (14.2 on 08/15/2023), potassium incidentally noted low at 2.7 and bicarb at 18 with creatinine 0.77.  She underwent EGD which showed normal esophagus stomach and duodenum.  Iron  saturation was 12 with ferritin of 47, B12 385.  GI service evidently questioned whether she was indeed experiencing melena, as she takes daily iron  which may have turned her stools dark.  She received Venofir and a B12 injection and was discharged after repleting her electrolytes.To her knowledge she has had no further bleeding, though she is unable to see.  She has not had dizziness or fatigue which may suggest anemia.   She is feeling very stunned upon the passing of her daughter.  Hasn't yet considered grief counseling, which we discussed today.  She just lost her sister in 04/2023 which whom she'd been living.  Appetite is down, as is her weight.  She is sleeping fair.  We discussed the finding of a lung nodule.  She feels that she may have been having more of a cough than usual, though she's thought nothing of it.  No dyspnea.  Unknown hemoptysis given her blindness.  No recent delusions of parasitosis.  The spot on her mid forehead hairline has healed where she had been picking.   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   Iron  deficiency anemia due to chronic blood loss 12/28/2022   GI bleed hospitalized Fall 2024, no source found 12/27/2022   Nocturia more than twice per night 11/15/2022   Osteoarthritis of left hip 06/03/2021   Legal blindness 04/07/2021   Insomnia 10/20/2019   Hypokalemia 04/12/2018   Opioid dependence with current use (HCC) 04/12/2018   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   Diastolic dysfunction 09/28/2017   Marijuana use 07/03/2016   Bilateral sensorineural hearing loss 06/11/2014   Subjective tinnitus of both ears 05/18/2014   Right rotator cuff tear 02/01/2013   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   Generalized anxiety disorder 04/05/2012   Mixed hyperlipidemia 04/05/2012   Hyperlipidemia with target low density lipoprotein (LDL) cholesterol less than 100 mg/dL 98/75/7985   Smoker 87/90/7986   History of small bowel obstruction 02/08/2012   PAD (peripheral artery disease) (HCC) 11/01/2011   Constipation due to pain medication 04/27/2010   Genital herpes 07/20/2006   Essential hypertension 07/20/2006   Lumbar degenerative disc disease 07/20/2006   Human immunodeficiency virus disease (HCC) 03/27/1986  Current Outpatient Medications:    acyclovir  (ZOVIRAX ) 400 MG tablet, Take 400 mg by mouth 3 (three) times daily as needed., Disp: , Rfl:    alendronate  (FOSAMAX ) 70 MG tablet, Take 1 tablet (70 mg total) by mouth every Sunday. TAKE 1 TABLET BY MOUTH ONCE WEEKLY ON SUNDAY take with a full glass of water on an empty stomach Strength: 70 mg, Disp: 12 tablet, Rfl: 3   ALPRAZolam  (XANAX ) 0.5 MG tablet, Take 1 tablet  (0.5 mg total) by mouth at bedtime as needed for sleep or anxiety., Disp: 30 tablet, Rfl: 3   amLODipine  (NORVASC ) 10 MG tablet, Take 10 mg by mouth daily., Disp: , Rfl:    aspirin  EC 81 MG tablet, Take 1 tablet (81 mg total) by mouth daily with breakfast., Disp: 90 tablet, Rfl: 3   atorvastatin  (LIPITOR) 10 MG tablet, Take 1 tablet (10 mg total) by mouth at bedtime., Disp: 90 tablet, Rfl: 3   benazepril  (LOTENSIN ) 40 MG tablet, Take 40 mg by mouth daily., Disp: , Rfl:    bictegravir-emtricitabine -tenofovir  AF (BIKTARVY ) 50-200-25 MG TABS tablet, Take 1 tablet by mouth daily., Disp: 30 tablet, Rfl: 11   brimonidine  (ALPHAGAN ) 0.2 % ophthalmic solution, 1 drop 3 (three) times daily., Disp: , Rfl:    clopidogrel  (PLAVIX ) 75 MG tablet, Take 1 tablet (75 mg total) by mouth daily., Disp: 90 tablet, Rfl: 3   cyanocobalamin  (VITAMIN B12) 1000 MCG tablet, Take 1 tablet (1,000 mcg total) by mouth daily., Disp: 30 tablet, Rfl: 2   diclofenac  Sodium (VOLTAREN ) 1 % GEL, Apply 2 g topically 4 (four) times daily. APPLY TWO GRAMS TO AFFECTED AREA(S) FOUR TIMES DAILY Strength: 1 %, Disp: 350 g, Rfl: 2   DIPHENHYDRAMINE  HCL, TOPICAL, (BENADRYL  ITCH STOPPING) 2 % GEL, Apply 1 Application topically every 4 (four) hours as needed (symptoms of bugs on scalp)., Disp: 57 g, Rfl: 3   dorzolamide -timolol  (COSOPT ) 2-0.5 % ophthalmic solution, Place 1 drop into the right eye 2 (two) times daily., Disp: , Rfl:    ferrous sulfate  325 (65 FE) MG tablet, Take 1 tablet (325 mg total) by mouth daily with breakfast., Disp: , Rfl:    gabapentin  (NEURONTIN ) 300 MG capsule, TAKE ONE CAPSULE BY MOUTH THREE TIMES DAILY (DOSE decrease), Disp: 270 capsule, Rfl: 3   latanoprost  (XALATAN ) 0.005 % ophthalmic solution, 1 drop at bedtime., Disp: , Rfl:    mirtazapine  (REMERON ) 7.5 MG tablet, Take 1 tablet (7.5 mg total) by mouth at bedtime., Disp: 30 tablet, Rfl: 3   oxyCODONE -acetaminophen  (PERCOCET/ROXICET) 5-325 MG tablet, Take 1 tablet only  for severe pain; as often as every 8 hours.  Use sparingly due to risk of falls., Disp: 60 tablet, Rfl: 0   polyethylene glycol (MIRALAX ) 17 g packet, Take 17 g by mouth daily., Disp: 90 packet, Rfl: 2   senna (SENOKOT) 8.6 MG TABS tablet, Take 1 tablet (8.6 mg total) by mouth daily., Disp: 120 tablet, Rfl: 0   thiamine  (VITAMIN B1) 100 MG tablet, Take 1 tablet (100 mg total) by mouth daily., Disp: 90 tablet, Rfl: 3  Objective BP (!) 239/83 (BP Location: Right Arm, Patient Position: Sitting, Cuff Size: Small)   Pulse (!) 54   Temp (!) 97.5 F (36.4 C) (Oral)   Wt 113 lb (51.3 kg)   SpO2 98%   BMI 18.24 kg/m   Exam:  Exam: Thin, frail appearing lady, nicely dressed and groomed, alert and appropriately interactive, but much more reserved today and at times quietly tearful.  Soft tissues noticeably diminished; bony protuberances notable.  Periphery warm and well perfused. Lungs clear throughout. Deep inspiration doesn't elicit discomfort.   Problems addressed today:  Unintended weight loss Assessment & Plan: After successful weight gain with increased calories, she has again lost, due to new grief.  She does have food available but will need to be intentional about eating enough to sustain herself.  She is already very frail. Has been taking mirtazapine  7.5 nightly prior to this.  Ekbom's delusional parasitosis [facility]) Assessment & Plan: Referral to derm pending; this hasn't been bothering her as much.  Area of her concern at upper mid forehead hairline has healed from excoriations.  Grief at loss of child Assessment & Plan: Recommended [facility] grief counseling to Ms. Manns and her son.  Contact information provided.  Nodule of upper lobe of right lung Assessment & Plan: Identified on recent screening CT which resulted last week: Spiculated and enlarging posterior segment right upper lobe nodule. Lung-RADS 4X, highly suspicious.  Discussed the implications with her and her son.  She  desires referral to pulmonology and would wish to proceed with treatment if recommended.  referral was placed earlier.   Melena Assessment & Plan: Question of melena at recent overnight hospitalization; EGD was negative.  No abdominal or digestive discomfort, no constipation or diarrhea.  She is blind so can't assess stool color.  F/u Hg today.             No follow-ups on file.

## 2023-11-22 NOTE — Progress Notes (Signed)
 [phone] [id number] [name] [phone]

## 2023-11-22 NOTE — Assessment & Plan Note (Signed)
Referral to derm pending.

## 2023-11-22 NOTE — Patient Instructions (Addendum)
 Please call [facility] [phone] to inquire about arranging complimentary (free) grief counseling, which can be done over the phone.  Norleen, I've asked [facility] to reach out to you  - if you don't hear from them, please call [phone].  You can look them up on line.  I think they could be a huge help.  We will get together in a month.  Ms. Elmer, hard as it is, you need to maintain your weight.  Hang in there.    The pulmonary office should be calling soon to arrange your consultation to discuss your lung nodule.  Dr. Trudy

## 2023-11-23 LAB — CBC
Hematocrit: 41.6 % (ref 34.0–46.6)
Hemoglobin: 13.4 g/dL (ref 11.1–15.9)
MCH: 32.4 pg (ref 26.6–33.0)
MCHC: 32.2 g/dL (ref 31.5–35.7)
MCV: 101 fL — ABNORMAL HIGH (ref 79–97)
Platelets: 304 x10E3/uL (ref 150–450)
RBC: 4.14 x10E6/uL (ref 3.77–5.28)
RDW: 12.2 % (ref 11.7–15.4)
WBC: 7.6 x10E3/uL (ref 3.4–10.8)

## 2023-11-28 NOTE — Assessment & Plan Note (Signed)
 Recommended [facility] grief counseling to Ms. Manns and her son.  Contact information provided.

## 2023-11-30 ENCOUNTER — Encounter: Payer: Self-pay | Admitting: Internal Medicine

## 2023-11-30 DIAGNOSIS — R911 Solitary pulmonary nodule: Secondary | ICD-10-CM | POA: Insufficient documentation

## 2023-11-30 NOTE — Assessment & Plan Note (Signed)
 Identified on recent screening CT which resulted last week: Spiculated and enlarging posterior segment right upper lobe nodule. Lung-RADS 4X, highly suspicious.  Discussed the implications with her and her son.  She desires referral to pulmonology and would wish to proceed with treatment if recommended.  referral was placed earlier.

## 2023-11-30 NOTE — Assessment & Plan Note (Signed)
 Question of melena at recent overnight hospitalization; EGD was negative.  No abdominal or digestive discomfort, no constipation or diarrhea.  She is blind so can't assess stool color.  F/u Hg today.

## 2023-12-03 ENCOUNTER — Other Ambulatory Visit: Payer: Self-pay | Admitting: *Deleted

## 2023-12-03 DIAGNOSIS — M1612 Unilateral primary osteoarthritis, left hip: Secondary | ICD-10-CM

## 2023-12-03 DIAGNOSIS — F5102 Adjustment insomnia: Secondary | ICD-10-CM

## 2023-12-03 DIAGNOSIS — F411 Generalized anxiety disorder: Secondary | ICD-10-CM

## 2023-12-05 ENCOUNTER — Other Ambulatory Visit: Payer: Self-pay | Admitting: Internal Medicine

## 2023-12-05 MED ORDER — ACYCLOVIR 400 MG PO TABS: 400.0000 mg | ORAL_TABLET | Freq: Three times a day (TID) | ORAL | 0 refills | Status: DC | PRN

## 2023-12-05 MED ORDER — ALPRAZOLAM 0.5 MG PO TABS: 0.5000 mg | ORAL_TABLET | Freq: Every evening | ORAL | 3 refills | Status: DC | PRN

## 2023-12-05 MED ORDER — OXYCODONE-ACETAMINOPHEN 5-325 MG PO TABS: ORAL_TABLET | ORAL | 0 refills | Status: DC

## 2023-12-15 NOTE — Progress Notes (Deleted)
 New Patient Pulmonology Office Visit   Subjective:  Patient ID: Jessica [name], female    DOB: November 24, 1940  MRN: [id number]  Referred by: Trudy Mliss Dragon, MD  CC: No chief complaint on file.   HPI Jessica [name] is a 83 y.o. female with *** who presents for initial evaluation of solitary pulmonary nodule.  Symptoms Associated with Lung cancer:   {Central Tumor Sx:33645}  {Peripheral Tumor Sx:33646}  {Sx of Metastasis:33647}  {Conditions associated with lung cancer & imp to identify prior to bronch:33648}  {STOPBANG:33649}  {Hx of Anesthesia reactions:33650}  PMH:   Important Medications:   Allergies:   Social History:  {Smoking and [employer] Exposure:33651}  {Occupational Exposures:33652}  {Military Specific Exposures:33653}  Family History: {Cancer-related QY:66345}  ASA grade:  {ASA GRADE:110003}  Karnofsky Performance Status: {Karnofsky Performance Status:33655}  ECOG Performance Status: {findings; ecog performance status:31780}   {PULM QUESTIONNAIRES (Optional):33196}  ROS  Allergies: Hctz [hydrochlorothiazide ]  Current Outpatient Medications:    acyclovir  (ZOVIRAX ) 400 MG tablet, Take 1 tablet (400 mg total) by mouth 3 (three) times daily as needed., Disp: 21 tablet, Rfl: 0   alendronate  (FOSAMAX ) 70 MG tablet, Take 1 tablet (70 mg total) by mouth every Sunday. TAKE 1 TABLET BY MOUTH ONCE WEEKLY ON SUNDAY take with a full glass of water on an empty stomach Strength: 70 mg, Disp: 12 tablet, Rfl: 3   ALPRAZolam  (XANAX ) 0.5 MG tablet, Take 1 tablet (0.5 mg total) by mouth at bedtime as needed for sleep or anxiety., Disp: 30 tablet, Rfl: 3   amLODipine  (NORVASC ) 10 MG tablet, Take 10 mg by mouth daily., Disp: , Rfl:    aspirin  EC 81 MG tablet, Take 1 tablet (81 mg total) by mouth daily with breakfast., Disp: 90 tablet, Rfl: 3   atorvastatin  (LIPITOR) 10 MG tablet, Take 1 tablet (10 mg total) by mouth at bedtime., Disp: 90  tablet, Rfl: 3   benazepril  (LOTENSIN ) 40 MG tablet, Take 40 mg by mouth daily., Disp: , Rfl:    bictegravir-emtricitabine -tenofovir  AF (BIKTARVY ) 50-200-25 MG TABS tablet, Take 1 tablet by mouth daily., Disp: 30 tablet, Rfl: 11   brimonidine  (ALPHAGAN ) 0.2 % ophthalmic solution, 1 drop 3 (three) times daily., Disp: , Rfl:    clopidogrel  (PLAVIX ) 75 MG tablet, Take 1 tablet (75 mg total) by mouth daily., Disp: 90 tablet, Rfl: 3   cyanocobalamin  (VITAMIN B12) 1000 MCG tablet, Take 1 tablet (1,000 mcg total) by mouth daily., Disp: 30 tablet, Rfl: 2   diclofenac  Sodium (VOLTAREN ) 1 % GEL, Apply 2 g topically 4 (four) times daily. APPLY TWO GRAMS TO AFFECTED AREA(S) FOUR TIMES DAILY Strength: 1 %, Disp: 350 g, Rfl: 2   DIPHENHYDRAMINE  HCL, TOPICAL, (BENADRYL  ITCH STOPPING) 2 % GEL, Apply 1 Application topically every 4 (four) hours as needed (symptoms of bugs on scalp)., Disp: 57 g, Rfl: 3   dorzolamide -timolol  (COSOPT ) 2-0.5 % ophthalmic solution, Place 1 drop into the right eye 2 (two) times daily., Disp: , Rfl:    ferrous sulfate  325 (65 FE) MG tablet, Take 1 tablet (325 mg total) by mouth daily with breakfast., Disp: , Rfl:    gabapentin  (NEURONTIN ) 300 MG capsule, TAKE ONE CAPSULE BY MOUTH THREE TIMES DAILY (DOSE decrease), Disp: 270 capsule, Rfl: 3   latanoprost  (XALATAN ) 0.005 % ophthalmic solution, 1 drop at bedtime., Disp: , Rfl:    mirtazapine  (REMERON ) 7.5 MG tablet, Take 1 tablet (7.5 mg total) by mouth at bedtime., Disp: 30 tablet, Rfl: 3  oxyCODONE -acetaminophen  (PERCOCET/ROXICET) 5-325 MG tablet, Take 1 tablet only for severe pain; as often as every 8 hours.  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  Hearing aides with possible masking of tinnitus recommended but patient wished to defer secondary to finances.   Blood transfusion without reported diagnosis    pt denies   Bursitis of right shoulder 07/12/2012   s/p shoulder injection 07/12/2012    Cataract of right eye    REMOVED RIGHT EYE 4-19    Constipation due to pain medication 04/27/2010   Diverticulosis 02/08/2012   Extensive left-sided diverticula on colonoscopy March 2012 per Dr. Shaaron    Diverticulosis of colon without hemorrhage 02/08/2012   Extensive left-sided diverticula on colonoscopy March 2012 per Dr. 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   Headache 10/20/2019   Heart murmur 1961   History of small bowel obstruction 02/08/2012   s/p Exploratory laparotomy, lysis of adhesions 02/12/12     History of vitamin D  deficiency 05/29/2018   Vitamin D  18.96 (04/30/2018), treated with ergocalciferol  50,000 units PO QWk X 4 weeks     Human immunodeficiency virus disease (HCC) 03/27/1986   Hyperlipidemia LDL goal < 100 04/05/2012   Hypokalemia 04/12/2018   Insomnia 10/20/2019   Iron  deficiency anemia due to chronic blood loss 04/04/2023   Left hip pain 04/11/2021   Leg pain 04/11/2021   Long-term current use of opiate analgesic 03/17/2016   Loose stools 04/08/2019   Lumbar degenerative disc disease 07/20/2006   With chronic back pain    Marijuana use 07/03/2016   Micturition syncope 09/20/2015   Nausea and vomiting 04/08/2019   Opiate dependence (HCC) 04/12/2018    Opioid dependence with current use (HCC) 04/12/2018   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   s/p aortobifem bypass 2009    Periumbilical hernia 05/18/2014   1 cm left periumbilical abdominal wall defect   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   DEXA 04/15/2012: L1-L4 spine T -3.9, Right femur T -3.0    Right rotator cuff tear 02/01/2013   Responds to periodic steroid injections   Seborrhea 09/01/2010   Shoulder pain, right 12/04/2017   Small bowel obstruction due to adhesions (HCC) 02/08/2012   s/p Exploratory laparotomy, lysis of adhesions 02/12/12     Subjective tinnitus of both ears 05/18/2014   Tobacco abuse 02/19/2012   Tobacco abuse    Vasovagal syncope 02/15/2015   Visual hallucination 12/04/2022   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 Merrilyn; took out 12 inches of small intestiines and removed blockage   COLONOSCOPY  2012   COLONOSCOPY WITH PROPOFOL  N/A 12/29/2022   Procedure: COLONOSCOPY WITH PROPOFOL ;  Surgeon: Eartha Angelia Sieving, MD;  [facility]: [facility];  Service: Gastroenterology;  Laterality: N/A;   ESOPHAGOGASTRODUODENOSCOPY N/A 11/03/2023   Procedure: EGD (ESOPHAGOGASTRODUODENOSCOPY);  Surgeon: Eartha Angelia, Sieving, MD;  [facility]: [facility];  Service: Gastroenterology;  Laterality: N/A;   ESOPHAGOGASTRODUODENOSCOPY (EGD) WITH PROPOFOL  N/A 12/28/2022   Procedure:  ESOPHAGOGASTRODUODENOSCOPY (EGD) WITH PROPOFOL ;  Surgeon: Cinderella Deatrice FALCON, MD;  [facility]: [facility];  Service: Endoscopy;  Laterality: N/A;   EYE SURGERY     EYE SURGERY  06/29/2020   06-29-2020- RIGHT CATARACT REMOVED AND LEFT EYE SURGERY TO REMOVE SAND LIKE SUBSTANCE    FEMORAL ARTERY EXPLORATION Left 07/10/2020   Procedure: REDO LEFT FEMORAL ARTERY EXPOSURE;  Surgeon: Serene Gaile ORN, MD;  Location: [facility];  Service: Vascular;  Laterality: Left;   GIVENS CAPSULE STUDY N/A 12/30/2022   Procedure: GIVENS CAPSULE STUDY;  Surgeon: Eartha Angelia Sieving, MD;  [facility]: [facility];  Service: Gastroenterology;  Laterality: N/A;   LAPAROTOMY  02/12/2012   Procedure: EXPLORATORY LAPAROTOMY;  Surgeon: Jina Nephew, MD;  Location: [facility];  Service: General;  Laterality: N/A;  Exploratory Laparotomy, lysis of adhesions   SMALL INTESTINE SURGERY     THROMBECTOMY FEMORAL ARTERY Left 07/10/2020   Procedure: THROMBECTOMY AORTA-BIFEMORAL GRAFT, PROFUNDA, AND SUPERFICIAL FEMORAL ARTERY  LEFT LEG;  Surgeon: Serene Gaile ORN, MD;  Location: [facility];  Service: Vascular;  Laterality: Left;   Family History  Problem Relation Age of Onset   Kidney failure Mother    Diabetes Mother    Hypertension Mother    Heart disease Mother    Glaucoma Father    Congestive Heart Failure Sister    Diabetes Sister    Kidney disease Sister    Diabetes Brother    Unexplained death Brother 34       Automobile accident   Hypothyroidism Daughter    Arthritis Daughter        Neck/Back   Healthy Son    HIV/AIDS Brother    HIV Daughter    Kidney disease Daughter    Arthritis Son        Knee   Colon cancer Neg Hx    Colon polyps Neg Hx    Esophageal cancer Neg Hx    Rectal cancer Neg Hx    Stomach cancer Neg Hx    Social History   Socioeconomic History   Marital status: Widowed    Spouse name: Not on file   Number of children: 4   Years of education: 2y college   Highest education level: Not on file   Occupational History   Occupation: retired    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   Tobacco Use   Smoking status: Every Day    Current packs/day: 0.30    Average packs/day: 0.3 packs/day for 50.0 years (15.0 ttl pk-yrs)  Types: Cigarettes    Passive exposure: Current   Smokeless tobacco: Never   Tobacco comments:    5 cigs per day  Vaping Use   Vaping status: Never Used  Substance and Sexual Activity   Alcohol use: No    Alcohol/week: 0.0 standard drinks of alcohol    Comment: last drink of alcohol ~ 1977   Drug use: No   Sexual activity: Never  Other Topics Concern   Not on file  Social History Narrative   Lives alone in [place], [place]   Social Drivers of Health   Financial Resource Strain: Low Risk  (08/16/2022)   Overall Financial Resource Strain (CARDIA)    Difficulty of Paying Living Expenses: Not hard at all  Food Insecurity: No Food Insecurity (11/03/2023)   Hunger Vital Sign    Worried About Running Out of Food in the Last Year: Never true    Ran Out of Food in the Last Year: Never true  Transportation Needs: No Transportation Needs (11/03/2023)   PRAPARE - Administrator, Civil Service (Medical): No    Lack of Transportation (Non-Medical): No  Physical Activity: Insufficiently Active (08/16/2022)   Exercise Vital Sign    Days of Exercise per Week: 1 day    Minutes of Exercise per Session: 10 min  Stress: Stress Concern Present (08/16/2022)   [employer] of Occupational Health - Occupational Stress Questionnaire    Feeling of Stress : To some extent  Social Connections: Moderately Isolated (11/03/2023)   Social Connection and Isolation Panel    Frequency of Communication with Friends and Family: More than three times a week    Frequency of Social Gatherings with Friends and Family: More than three times a week    Attends Religious Services: More than 4 times per year    Active Member of [employer] or Organizations: No    Attends Occupational hygienist Meetings: Never    Marital Status: Widowed  Intimate Partner Violence: Not At Risk (11/03/2023)   Humiliation, Afraid, Rape, and Kick questionnaire    Fear of Current or Ex-Partner: No    Emotionally Abused: No    Physically Abused: No    Sexually Abused: No       Objective:  There were no vitals taken for this visit. {Pulm Vitals (Optional):32837}  Physical Exam  Diagnostic Review:  {Labs (Optional):32838} LDCT 10/23/23: IMPRESSION: 1. Spiculated and enlarging posterior segment right upper lobe nodule. Lung-RADS 4X, highly suspicious. Additional imaging evaluation or consultation with Pulmonology or Thoracic Surgery recommended. These results will be called to the ordering clinician or representative by the Radiologist Assistant, and communication documented in the PACS or [employer]. 2. 4.0 cm ascending aortic aneurysm, unchanged. Recommend annual imaging followup by CTA or MRA. This recommendation follows 2010 ACCF/AHA/AATS/ACR/ASA/SCA/SCAI/SIR/STS/SVM Guidelines for the Diagnosis and Management of Patients with Thoracic Aortic Disease. Circulation. 2010; 121: [id number]. Aortic aneurysm NOS (ICD10-I71.9). 3. Aortic atherosclerosis (ICD10-I70.0). Coronary artery calcification. 4. Enlarged pulmonic trunk, indicative of pulmonary arterial hypertension. 5.  Emphysema (ICD10-J43.9).    Assessment & Plan:   Assessment & Plan   No orders of the defined types were placed in this encounter.     No follow-ups on file.   Sahir Tolson, MD

## 2023-12-17 ENCOUNTER — Ambulatory Visit (HOSPITAL_BASED_OUTPATIENT_CLINIC_OR_DEPARTMENT_OTHER): Admitting: Pulmonary Disease

## 2023-12-17 NOTE — Progress Notes (Signed)
 New Patient Pulmonology Office Visit   Subjective:  Patient ID: Jessica [name], female    DOB: April 08, 1940  MRN: [id number]  Referred by: Trudy Mliss Dragon, MD  CC:  Chief Complaint  Patient presents with   Establish Care    HPI Jessica [name] is a 83 y.o. female with HTN, PAD on DAPT (plavix , and aspirin ), emphysema, nicotine  dependence with current use, blindness who presents for initial evaluation of solitary pulmonary nodule.  Discussed the use of AI scribe software for clinical note transcription with the patient, who gave verbal consent to proceed.  History of Present Illness Jessica [name] is an 83 year old female who presents with an enlarging lung nodule. She is accompanied by Jessica [name], a family member.  She has a history of smoking for approximately 60 years, starting at the age of 65, and currently smokes about 10 cigarettes a day. She has a history of emphysema, which was noted on a recent CT scan. She experiences occasional shortness of breath and has requested an inhaler for relief.  A CT scan performed a year ago showed a small nodule in the periphery of her right upper lobe, which has since increased in size from approximately 6-7 millimeters to 10 millimeters. She has a family history of lung cancer, with her brother having died from the disease.  She is currently on Plavix  and aspirin  following a stent placement in her leg. She denies any history of stroke or heart attack. She also reports a heart murmur diagnosed at age 40 and experiences occasional shortness of breath but does not use an inhaler.  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 She is unsure of any asbestos exposure during her employment. She denies any history of diabetes or allergies to latex, but reports an allergy to hydrochlorothiazide  (HCTZ).  Symptoms Associated with Lung  cancer:   Central Tumor Sx: Dyspnea  Peripheral Tumor Sx: Dyspnea  Sx of metastatic disease: unintentional weight loss.  Conditions associated with lung cancer & imp to identify prior to bronch: CVD & COPD  Anesthesia Reactions: Underwent EGD under general anesthesia on 11/10/2023 without any problems.  PMH:  - PVD - Emphysema - Blindness - HTN  Important Medications: - Plavix  - Aspirin   Allergies: hydrochlorothiazide , no latex allergy  Social History:  Smoking and [employer] Exposure: Tobacco Smoking 30 PY, current smoker  Family History: History of lung cancer: Brother with lung cancer  ASA grade:  ASA 3 - Patient with moderate systemic disease with functional limitations  Karnofsky Performance Status: 60 Requires occasional assistance, but is able to care for most personal needs   ECOG Performance Status: (2) Ambulatory and capable of self care, unable to carry out work activity, up and about > 50% or waking hours  ROS  Allergies: Hctz [hydrochlorothiazide ]  Current Outpatient Medications:    acyclovir  (ZOVIRAX ) 400 MG tablet, Take 1 tablet (400 mg total) by mouth 3 (three) times daily as needed., Disp: 21 tablet, Rfl: 0   albuterol  (VENTOLIN  HFA) 108 (90 Base) MCG/ACT inhaler, Inhale 2 puffs into the lungs every 6 (six) hours as needed for wheezing or shortness of breath., Disp: 8.5 g, Rfl: 2   alendronate  (FOSAMAX ) 70 MG tablet, Take 1 tablet (70 mg total) by mouth every Sunday. TAKE 1 TABLET BY MOUTH ONCE WEEKLY ON SUNDAY take with a full glass of  water on an empty stomach Strength: 70 mg, Disp: 12 tablet, Rfl: 3   ALPRAZolam  (XANAX ) 0.5 MG tablet, Take 1 tablet (0.5 mg total) by mouth at bedtime as needed for sleep or anxiety., Disp: 30 tablet, Rfl: 3   amLODipine  (NORVASC ) 10 MG tablet, Take 10 mg by mouth daily., Disp: , Rfl:    aspirin  EC 81 MG tablet, Take 1 tablet (81 mg total) by mouth daily with breakfast., Disp: 90 tablet, Rfl: 3   atorvastatin   (LIPITOR) 10 MG tablet, Take 1 tablet (10 mg total) by mouth at bedtime., Disp: 90 tablet, Rfl: 3   benazepril  (LOTENSIN ) 40 MG tablet, Take 40 mg by mouth daily., Disp: , Rfl:    bictegravir-emtricitabine -tenofovir  AF (BIKTARVY ) 50-200-25 MG TABS tablet, Take 1 tablet by mouth daily., Disp: 30 tablet, Rfl: 11   brimonidine  (ALPHAGAN ) 0.2 % ophthalmic solution, 1 drop 3 (three) times daily., Disp: , Rfl:    clopidogrel  (PLAVIX ) 75 MG tablet, Take 1 tablet (75 mg total) by mouth daily., Disp: 90 tablet, Rfl: 3   cyanocobalamin  (VITAMIN B12) 1000 MCG tablet, Take 1 tablet (1,000 mcg total) by mouth daily., Disp: 30 tablet, Rfl: 2   diclofenac  Sodium (VOLTAREN ) 1 % GEL, Apply 2 g topically 4 (four) times daily. APPLY TWO GRAMS TO AFFECTED AREA(S) FOUR TIMES DAILY Strength: 1 %, Disp: 350 g, Rfl: 2   DIPHENHYDRAMINE  HCL, TOPICAL, (BENADRYL  ITCH STOPPING) 2 % GEL, Apply 1 Application topically every 4 (four) hours as needed (symptoms of bugs on scalp)., Disp: 57 g, Rfl: 3   dorzolamide -timolol  (COSOPT ) 2-0.5 % ophthalmic solution, Place 1 drop into the right eye 2 (two) times daily., Disp: , Rfl:    ferrous sulfate  325 (65 FE) MG tablet, Take 1 tablet (325 mg total) by mouth daily with breakfast., Disp: , Rfl:    gabapentin  (NEURONTIN ) 300 MG capsule, TAKE ONE CAPSULE BY MOUTH THREE TIMES DAILY (DOSE decrease), Disp: 270 capsule, Rfl: 3   latanoprost  (XALATAN ) 0.005 % ophthalmic solution, 1 drop at bedtime., Disp: , Rfl:    mirtazapine  (REMERON ) 7.5 MG tablet, Take 1 tablet (7.5 mg total) by mouth at bedtime., Disp: 30 tablet, Rfl: 3   nicotine  (NICODERM CQ  - DOSED IN MG/24 HOURS) 14 mg/24hr patch, RX #1 Weeks 1-6: 14 mg x 1 patch daily. Wear for 24 hours. If you have sleep disturbances, remove at bedtime., Disp: 42 patch, Rfl: 0   nicotine  (NICODERM CQ  - DOSED IN MG/24 HR) 7 mg/24hr patch, RX #2 Weeks 7-8: 7 mg x 1 patch dailyWear for 24 hours. If you have sleep disturbances, remove at bedtime.., Disp: 14  patch, Rfl: 0   oxyCODONE -acetaminophen  (PERCOCET/ROXICET) 5-325 MG tablet, Take 1 tablet only for severe pain; as often as every 8 hours.  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  Hearing aides with possible masking of tinnitus recommended but patient wished to defer secondary to finances.   Blood transfusion without reported diagnosis    pt denies   Bursitis of right shoulder 07/12/2012   s/p shoulder injection 07/12/2012    Cataract of right eye    REMOVED RIGHT EYE 4-19    Constipation due to pain medication 04/27/2010   Diverticulosis 02/08/2012   Extensive left-sided diverticula on colonoscopy March 2012 per Dr. Shaaron    Diverticulosis of colon without hemorrhage 02/08/2012   Extensive left-sided diverticula on colonoscopy March 2012 per Dr. 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   Headache 10/20/2019   Heart murmur 1961   History of small bowel obstruction 02/08/2012   s/p Exploratory laparotomy, lysis of adhesions 02/12/12     History of vitamin D  deficiency 05/29/2018   Vitamin D  18.96 (04/30/2018), treated with ergocalciferol  50,000 units PO QWk X 4 weeks     Human immunodeficiency virus disease (HCC) 03/27/1986   Hyperlipidemia LDL goal  < 100 04/05/2012   Hypokalemia 04/12/2018   Insomnia 10/20/2019   Iron  deficiency anemia due to chronic blood loss 04/04/2023   Left hip pain 04/11/2021   Leg pain 04/11/2021   Long-term current use of opiate analgesic 03/17/2016   Loose stools 04/08/2019   Lumbar degenerative disc disease 07/20/2006   With chronic back pain    Marijuana use 07/03/2016   Micturition syncope 09/20/2015   Nausea and vomiting 04/08/2019   Opiate dependence (HCC) 04/12/2018   Opioid dependence with current use (HCC) 04/12/2018   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   s/p aortobifem bypass 2009    Periumbilical hernia 05/18/2014   1 cm left periumbilical abdominal wall defect   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   DEXA 04/15/2012: L1-L4 spine T -3.9, Right femur T -3.0    Right rotator cuff tear 02/01/2013   Responds to periodic steroid injections   Seborrhea 09/01/2010   Shoulder pain, right 12/04/2017   Small bowel obstruction due to adhesions (HCC) 02/08/2012   s/p Exploratory laparotomy, lysis of adhesions 02/12/12     Subjective tinnitus of both ears 05/18/2014   Tobacco abuse 02/19/2012   Tobacco abuse    Vasovagal syncope 02/15/2015   Visual hallucination 12/04/2022   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 Merrilyn; took out 12 inches of small intestiines and removed blockage   COLONOSCOPY  2012    COLONOSCOPY WITH PROPOFOL  N/A 12/29/2022   Procedure: COLONOSCOPY WITH PROPOFOL ;  Surgeon: Eartha Angelia Sieving, MD;  [facility]: [facility];  Service: Gastroenterology;  Laterality: N/A;   ESOPHAGOGASTRODUODENOSCOPY N/A 11/03/2023   Procedure: EGD (ESOPHAGOGASTRODUODENOSCOPY);  Surgeon: Eartha Angelia, Sieving, MD;  [facility]: [facility];  Service: Gastroenterology;  Laterality: N/A;   ESOPHAGOGASTRODUODENOSCOPY (EGD) WITH PROPOFOL  N/A 12/28/2022   Procedure: ESOPHAGOGASTRODUODENOSCOPY (EGD) WITH PROPOFOL ;  Surgeon: Cinderella Deatrice FALCON, MD;  [facility]: [facility];  Service: Endoscopy;  Laterality: N/A;   EYE SURGERY     EYE SURGERY  06/29/2020   06-29-2020- RIGHT CATARACT REMOVED AND LEFT EYE SURGERY TO REMOVE SAND LIKE SUBSTANCE    FEMORAL ARTERY EXPLORATION Left 07/10/2020   Procedure: REDO LEFT FEMORAL ARTERY EXPOSURE;  Surgeon: Serene Gaile ORN, MD;  Location: [facility];  Service: Vascular;  Laterality: Left;   GIVENS CAPSULE STUDY N/A 12/30/2022   Procedure: GIVENS CAPSULE STUDY;  Surgeon: Eartha Angelia Sieving, MD;  [facility]: [facility];  Service: Gastroenterology;  Laterality: N/A;   LAPAROTOMY  02/12/2012   Procedure: EXPLORATORY LAPAROTOMY;  Surgeon: Jina Nephew, MD;  Location: [facility];  Service: General;  Laterality: N/A;  Exploratory Laparotomy, lysis of adhesions   SMALL INTESTINE SURGERY     THROMBECTOMY FEMORAL ARTERY Left 07/10/2020   Procedure: THROMBECTOMY AORTA-BIFEMORAL GRAFT, PROFUNDA, AND SUPERFICIAL FEMORAL ARTERY  LEFT LEG;  Surgeon: Serene Gaile ORN, MD;  Location: [facility];  Service: Vascular;  Laterality: Left;   Family History  Problem Relation Age of Onset   Kidney failure Mother    Diabetes Mother    Hypertension Mother    Heart disease Mother    Glaucoma Father    Congestive Heart Failure Sister    Diabetes Sister    Kidney disease Sister    Diabetes Brother    Unexplained death Brother 69       Automobile accident   Hypothyroidism Daughter     Arthritis Daughter        Neck/Back   Healthy Son    HIV/AIDS Brother    HIV Daughter    Kidney disease Daughter    Arthritis Son        Knee   Colon cancer Neg Hx    Colon polyps Neg Hx    Esophageal cancer Neg Hx    Rectal cancer Neg Hx    Stomach cancer Neg Hx    Social History   Socioeconomic History   Marital status: Widowed    Spouse name: Not on file   Number of children: 4   Years of education: 2y college   Highest education level: Not on file  Occupational History   Occupation: retired    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   Tobacco Use   Smoking status: Every Day    Current packs/day: 0.30    Average packs/day: 0.3 packs/day for 50.0 years (15.0 ttl pk-yrs)    Types: Cigarettes    Passive exposure: Current   Smokeless tobacco: Never   Tobacco comments:    5 cigs per day  Vaping Use   Vaping status: Never Used  Substance and Sexual Activity   Alcohol use: No    Alcohol/week: 0.0 standard drinks of alcohol    Comment: last drink of alcohol ~ 1977   Drug use: No   Sexual activity: Never  Other Topics Concern   Not on file  Social History Narrative   Lives alone in [place], [place]   Social Drivers of Health   Financial Resource Strain: Low Risk  (08/16/2022)   Overall Financial Resource Strain (CARDIA)    Difficulty of Paying Living Expenses: Not hard at all  Food Insecurity: No Food Insecurity (11/03/2023)   Hunger Vital Sign    Worried About Running Out of  Food in the Last Year: Never true    Ran Out of Food in the Last Year: Never true  Transportation Needs: No Transportation Needs (11/03/2023)   PRAPARE - Administrator, Civil Service (Medical): No    Lack of Transportation (Non-Medical): No  Physical Activity: Insufficiently Active (08/16/2022)   Exercise Vital Sign    Days of Exercise per Week: 1 day    Minutes of Exercise per Session: 10 min  Stress: Stress Concern Present (08/16/2022)   [employer] of  Occupational Health - Occupational Stress Questionnaire    Feeling of Stress : To some extent  Social Connections: Moderately Isolated (11/03/2023)   Social Connection and Isolation Panel    Frequency of Communication with Friends and Family: More than three times a week    Frequency of Social Gatherings with Friends and Family: More than three times a week    Attends Religious Services: More than 4 times per year    Active Member of [employer] or Organizations: No    Attends Banker Meetings: Never    Marital Status: Widowed  Intimate Partner Violence: Not At Risk (11/03/2023)   Humiliation, Afraid, Rape, and Kick questionnaire    Fear of Current or Ex-Partner: No    Emotionally Abused: No    Physically Abused: No    Sexually Abused: No       Objective:  BP (!) 219/83   Pulse 62   Ht 5' 6 (1.676 m)   Wt 117 lb (53.1 kg)   SpO2 94%   BMI 18.88 kg/m  Wt Readings from Last 3 Encounters:  12/18/23 117 lb (53.1 kg)  11/22/23 113 lb (51.3 kg)  11/08/23 126 lb (57.2 kg)   BMI Readings from Last 3 Encounters:  12/18/23 18.88 kg/m  11/22/23 18.24 kg/m  11/08/23 20.34 kg/m   SpO2 Readings from Last 3 Encounters:  12/18/23 94%  11/22/23 98%  11/03/23 95%    Physical Exam General: elderly woman, not in any acute distress, thin, frail Eyes: PERRL, no scleral icterus ENMT: oropharynx clear, good dentition, no oral lesions, mallampati score II Skin: warm, intact, no rashes Neck: JVD flat, ROM and lymph node assessment normal CV: RRR, systolic murmur, nl S1 and S2, no peripheral edema Resp: clear to auscultation bilaterally, no wheezes, rales, or rhonchi, normal effort, + clubbing Abdom: Normoactive bowel sounds, soft, nontender, nondistended, no hepatosplenomegaly Neuro: Awake alert oriented to person place time and situation  Diagnostic Review:  Last CBC Lab Results  Component Value Date   WBC 7.6 11/22/2023   HGB 13.4 11/22/2023   HCT 41.6 11/22/2023   MCV  101 (H) 11/22/2023   MCH 32.4 11/22/2023   RDW 12.2 11/22/2023   PLT 304 11/22/2023   Last metabolic panel Lab Results  Component Value Date   GLUCOSE 78 11/03/2023   NA 139 11/03/2023   K 3.8 11/03/2023   CL 113 (H) 11/03/2023   CO2 18 (L) 11/03/2023   BUN 8 11/03/2023   CREATININE 0.63 11/03/2023   GFRNONAA >60 11/03/2023   CALCIUM  8.3 (L) 11/03/2023   PHOS 2.4 (L) 12/28/2022   PROT 6.0 (L) 11/02/2023   ALBUMIN  3.2 (L) 11/02/2023   LABGLOB 3.3 03/16/2017   AGRATIO 1.3 03/16/2017   BILITOT 0.7 11/02/2023   ALKPHOS 53 11/02/2023   AST 15 11/02/2023   ALT 9 11/02/2023   ANIONGAP 8 11/03/2023   LDCT 10/23/23: IMPRESSION: 1. Spiculated and enlarging posterior segment right upper lobe nodule. Lung-RADS 4X,  highly suspicious. Additional imaging evaluation or consultation with Pulmonology or Thoracic Surgery recommended. These results will be called to the ordering clinician or representative by the Radiologist Assistant, and communication documented in the PACS or [employer]. 2. 4.0 cm ascending aortic aneurysm, unchanged. Recommend annual imaging followup by CTA or MRA. This recommendation follows 2010 ACCF/AHA/AATS/ACR/ASA/SCA/SCAI/SIR/STS/SVM Guidelines for the Diagnosis and Management of Patients with Thoracic Aortic Disease. Circulation. 2010; 121: [id number]. Aortic aneurysm NOS (ICD10-I71.9). 3. Aortic atherosclerosis (ICD10-I70.0). Coronary artery calcification. 4. Enlarged pulmonic trunk, indicative of pulmonary arterial hypertension. 5.  Emphysema (ICD10-J43.9).    Assessment & Plan:   Assessment & Plan Solitary pulmonary nodule  Nicotine  dependence with current use Smoking assessment and cessation counseling  Patient currently smoking:  I spent 6 minutes discussing the continuing health risks associated with smoking. I offered the patient tobacco cessation treatment options such as medication and behavioral interventions to help facilitate smoking  cessation. I do not advise e-cigarettes as a form of stopping smoking. I have informed the patient that we can assist and have options of nicotine  replacement therapy, provided smoking cessation education today, provided smoking cessation counseling, and provided cessation resources.  Smoking cessation counseling advised for:  CPT 99406 3 to 10 minutes  Centrilobular emphysema (HCC)   Orders Placed This Encounter  Procedures   Procedural/ Surgical Case Request: VIDEO BRONCHOSCOPY WITH ENDOBRONCHIAL NAVIGATION   NM PET Image Initial (PI) Skull Base To Thigh   NM PET SUPER D CT   Pulmonary function test   Assessment and Plan Assessment & Plan Enlarging right upper lobe lung nodule, suspicious for malignancy Enlarging 10 mm nodule in right upper lobe, suspicious for stage 1 lung cancer. Discussed PET CT and biopsy evaluation. Explained biopsy risks (<3% for infection, lung collapse, bleeding). Discussed surgical removal or radiation therapy based on lung function. - Order PET CT scan and Super D CT to assess for malignancy and pre-procedural planning - Will pursue navigational bronchoscopy with ION for tissue diagnosis of RUL SPN. - Stop Plavix  5 days before biopsy. - Stop aspirin  2 days before biopsy.  Emphysema Mild emphysema likely due to smoking. Occasional dyspnea reported. MMRC < 2, CAT < 10. No exacerbations. Class A. - Prescribe inhaler for use as needed, Albuterol  MDI 2 puffs every 4 hours.  Nicotine  dependence Long-term smoking, 10 cigarettes/day. Willing to attempt cessation. - Prescribe nicotine  patches, 1 patch per day. - Provide information for smoking cessation support via 1-800-QUIT-NOW.  Superficial vein pain, possible superficial thrombophlebitis Pain in superficial vein, likely superficial thrombophlebitis. - Advise warm compresses to alleviate pain.  Essential Hypertension: BP elevated in 200s. No sign/symptoms of hypertensive emergency. The patient notes long  term elevation in BP. I messaged internal medicine team to set her up for an appointment to discuss BP medications. They are working on scheduling her soon.   Return in about 4 weeks (around 01/15/2024).   I spent 50 minutes reviewing patient's chart including prior consultant notes, imaging, and PFTs as well as face-to-face with the patient, over half in discussion of the diagnosis and the importance of compliance with the treatment plan.   Caelum Federici, MD

## 2023-12-17 NOTE — H&P (View-Only) (Signed)
 New Patient Pulmonology Office Visit   Subjective:  Patient ID: Jessica [name], female    DOB: April 08, 1940  MRN: [id number]  Referred by: Trudy Mliss Dragon, MD  CC:  Chief Complaint  Patient presents with   Establish Care    HPI Jessica [name] is a 83 y.o. female with HTN, PAD on DAPT (plavix , and aspirin ), emphysema, nicotine  dependence with current use, blindness who presents for initial evaluation of solitary pulmonary nodule.  Discussed the use of AI scribe software for clinical note transcription with the patient, who gave verbal consent to proceed.  History of Present Illness Jessica [name] is an 83 year old female who presents with an enlarging lung nodule. She is accompanied by Junior, a family member.  She has a history of smoking for approximately 60 years, starting at the age of 65, and currently smokes about 10 cigarettes a day. She has a history of emphysema, which was noted on a recent CT scan. She experiences occasional shortness of breath and has requested an inhaler for relief.  A CT scan performed a year ago showed a small nodule in the periphery of her right upper lobe, which has since increased in size from approximately 6-7 millimeters to 10 millimeters. She has a family history of lung cancer, with her brother having died from the disease.  She is currently on Plavix  and aspirin  following a stent placement in her leg. She denies any history of stroke or heart attack. She also reports a heart murmur diagnosed at age 40 and experiences occasional shortness of breath but does not use an inhaler.  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 She is unsure of any asbestos exposure during her employment. She denies any history of diabetes or allergies to latex, but reports an allergy to hydrochlorothiazide  (HCTZ).  Symptoms Associated with Lung  cancer:   Central Tumor Sx: Dyspnea  Peripheral Tumor Sx: Dyspnea  Sx of metastatic disease: unintentional weight loss.  Conditions associated with lung cancer & imp to identify prior to bronch: CVD & COPD  Anesthesia Reactions: Underwent EGD under general anesthesia on 11/10/2023 without any problems.  PMH:  - PVD - Emphysema - Blindness - HTN  Important Medications: - Plavix  - Aspirin   Allergies: hydrochlorothiazide , no latex allergy  Social History:  Smoking and [employer] Exposure: Tobacco Smoking 30 PY, current smoker  Family History: History of lung cancer: Brother with lung cancer  ASA grade:  ASA 3 - Patient with moderate systemic disease with functional limitations  Karnofsky Performance Status: 60 Requires occasional assistance, but is able to care for most personal needs   ECOG Performance Status: (2) Ambulatory and capable of self care, unable to carry out work activity, up and about > 50% or waking hours  ROS  Allergies: Hctz [hydrochlorothiazide ]  Current Outpatient Medications:    acyclovir  (ZOVIRAX ) 400 MG tablet, Take 1 tablet (400 mg total) by mouth 3 (three) times daily as needed., Disp: 21 tablet, Rfl: 0   albuterol  (VENTOLIN  HFA) 108 (90 Base) MCG/ACT inhaler, Inhale 2 puffs into the lungs every 6 (six) hours as needed for wheezing or shortness of breath., Disp: 8.5 g, Rfl: 2   alendronate  (FOSAMAX ) 70 MG tablet, Take 1 tablet (70 mg total) by mouth every Sunday. TAKE 1 TABLET BY MOUTH ONCE WEEKLY ON SUNDAY take with a full glass of  water on an empty stomach Strength: 70 mg, Disp: 12 tablet, Rfl: 3   ALPRAZolam  (XANAX ) 0.5 MG tablet, Take 1 tablet (0.5 mg total) by mouth at bedtime as needed for sleep or anxiety., Disp: 30 tablet, Rfl: 3   amLODipine  (NORVASC ) 10 MG tablet, Take 10 mg by mouth daily., Disp: , Rfl:    aspirin  EC 81 MG tablet, Take 1 tablet (81 mg total) by mouth daily with breakfast., Disp: 90 tablet, Rfl: 3   atorvastatin   (LIPITOR) 10 MG tablet, Take 1 tablet (10 mg total) by mouth at bedtime., Disp: 90 tablet, Rfl: 3   benazepril  (LOTENSIN ) 40 MG tablet, Take 40 mg by mouth daily., Disp: , Rfl:    bictegravir-emtricitabine -tenofovir  AF (BIKTARVY ) 50-200-25 MG TABS tablet, Take 1 tablet by mouth daily., Disp: 30 tablet, Rfl: 11   brimonidine  (ALPHAGAN ) 0.2 % ophthalmic solution, 1 drop 3 (three) times daily., Disp: , Rfl:    clopidogrel  (PLAVIX ) 75 MG tablet, Take 1 tablet (75 mg total) by mouth daily., Disp: 90 tablet, Rfl: 3   cyanocobalamin  (VITAMIN B12) 1000 MCG tablet, Take 1 tablet (1,000 mcg total) by mouth daily., Disp: 30 tablet, Rfl: 2   diclofenac  Sodium (VOLTAREN ) 1 % GEL, Apply 2 g topically 4 (four) times daily. APPLY TWO GRAMS TO AFFECTED AREA(S) FOUR TIMES DAILY Strength: 1 %, Disp: 350 g, Rfl: 2   DIPHENHYDRAMINE  HCL, TOPICAL, (BENADRYL  ITCH STOPPING) 2 % GEL, Apply 1 Application topically every 4 (four) hours as needed (symptoms of bugs on scalp)., Disp: 57 g, Rfl: 3   dorzolamide -timolol  (COSOPT ) 2-0.5 % ophthalmic solution, Place 1 drop into the right eye 2 (two) times daily., Disp: , Rfl:    ferrous sulfate  325 (65 FE) MG tablet, Take 1 tablet (325 mg total) by mouth daily with breakfast., Disp: , Rfl:    gabapentin  (NEURONTIN ) 300 MG capsule, TAKE ONE CAPSULE BY MOUTH THREE TIMES DAILY (DOSE decrease), Disp: 270 capsule, Rfl: 3   latanoprost  (XALATAN ) 0.005 % ophthalmic solution, 1 drop at bedtime., Disp: , Rfl:    mirtazapine  (REMERON ) 7.5 MG tablet, Take 1 tablet (7.5 mg total) by mouth at bedtime., Disp: 30 tablet, Rfl: 3   nicotine  (NICODERM CQ  - DOSED IN MG/24 HOURS) 14 mg/24hr patch, RX #1 Weeks 1-6: 14 mg x 1 patch daily. Wear for 24 hours. If you have sleep disturbances, remove at bedtime., Disp: 42 patch, Rfl: 0   nicotine  (NICODERM CQ  - DOSED IN MG/24 HR) 7 mg/24hr patch, RX #2 Weeks 7-8: 7 mg x 1 patch dailyWear for 24 hours. If you have sleep disturbances, remove at bedtime.., Disp: 14  patch, Rfl: 0   oxyCODONE -acetaminophen  (PERCOCET/ROXICET) 5-325 MG tablet, Take 1 tablet only for severe pain; as often as every 8 hours.  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  Hearing aides with possible masking of tinnitus recommended but patient wished to defer secondary to finances.   Blood transfusion without reported diagnosis    pt denies   Bursitis of right shoulder 07/12/2012   s/p shoulder injection 07/12/2012    Cataract of right eye    REMOVED RIGHT EYE 4-19    Constipation due to pain medication 04/27/2010   Diverticulosis 02/08/2012   Extensive left-sided diverticula on colonoscopy March 2012 per Dr. Shaaron    Diverticulosis of colon without hemorrhage 02/08/2012   Extensive left-sided diverticula on colonoscopy March 2012 per Dr. 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   Headache 10/20/2019   Heart murmur 1961   History of small bowel obstruction 02/08/2012   s/p Exploratory laparotomy, lysis of adhesions 02/12/12     History of vitamin D  deficiency 05/29/2018   Vitamin D  18.96 (04/30/2018), treated with ergocalciferol  50,000 units PO QWk X 4 weeks     Human immunodeficiency virus disease (HCC) 03/27/1986   Hyperlipidemia LDL goal  < 100 04/05/2012   Hypokalemia 04/12/2018   Insomnia 10/20/2019   Iron  deficiency anemia due to chronic blood loss 04/04/2023   Left hip pain 04/11/2021   Leg pain 04/11/2021   Long-term current use of opiate analgesic 03/17/2016   Loose stools 04/08/2019   Lumbar degenerative disc disease 07/20/2006   With chronic back pain    Marijuana use 07/03/2016   Micturition syncope 09/20/2015   Nausea and vomiting 04/08/2019   Opiate dependence (HCC) 04/12/2018   Opioid dependence with current use (HCC) 04/12/2018   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   s/p aortobifem bypass 2009    Periumbilical hernia 05/18/2014   1 cm left periumbilical abdominal wall defect   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   DEXA 04/15/2012: L1-L4 spine T -3.9, Right femur T -3.0    Right rotator cuff tear 02/01/2013   Responds to periodic steroid injections   Seborrhea 09/01/2010   Shoulder pain, right 12/04/2017   Small bowel obstruction due to adhesions (HCC) 02/08/2012   s/p Exploratory laparotomy, lysis of adhesions 02/12/12     Subjective tinnitus of both ears 05/18/2014   Tobacco abuse 02/19/2012   Tobacco abuse    Vasovagal syncope 02/15/2015   Visual hallucination 12/04/2022   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 Merrilyn; took out 12 inches of small intestiines and removed blockage   COLONOSCOPY  2012    COLONOSCOPY WITH PROPOFOL  N/A 12/29/2022   Procedure: COLONOSCOPY WITH PROPOFOL ;  Surgeon: Eartha Angelia Sieving, MD;  [facility];  Service: Gastroenterology;  Laterality: N/A;   ESOPHAGOGASTRODUODENOSCOPY N/A 11/03/2023   Procedure: EGD (ESOPHAGOGASTRODUODENOSCOPY);  Surgeon: Eartha Angelia, Sieving, MD;  [facility];  Service: Gastroenterology;  Laterality: N/A;   ESOPHAGOGASTRODUODENOSCOPY (EGD) WITH PROPOFOL  N/A 12/28/2022   Procedure: ESOPHAGOGASTRODUODENOSCOPY (EGD) WITH PROPOFOL ;  Surgeon: Cinderella Deatrice FALCON, MD;  [facility];  Service: Endoscopy;  Laterality: N/A;   EYE SURGERY     EYE SURGERY  06/29/2020   06-29-2020- RIGHT CATARACT REMOVED AND LEFT EYE SURGERY TO REMOVE SAND LIKE SUBSTANCE    FEMORAL ARTERY EXPLORATION Left 07/10/2020   Procedure: REDO LEFT FEMORAL ARTERY EXPOSURE;  Surgeon: Serene Gaile ORN, MD;  Location: [facility];  Service: Vascular;  Laterality: Left;   GIVENS CAPSULE STUDY N/A 12/30/2022   Procedure: GIVENS CAPSULE STUDY;  Surgeon: Eartha Angelia Sieving, MD;  [facility];  Service: Gastroenterology;  Laterality: N/A;   LAPAROTOMY  02/12/2012   Procedure: EXPLORATORY LAPAROTOMY;  Surgeon: Jina Nephew, MD;  Location: [facility];  Service: General;  Laterality: N/A;  Exploratory Laparotomy, lysis of adhesions   SMALL INTESTINE SURGERY     THROMBECTOMY FEMORAL ARTERY Left 07/10/2020   Procedure: THROMBECTOMY AORTA-BIFEMORAL GRAFT, PROFUNDA, AND SUPERFICIAL FEMORAL ARTERY  LEFT LEG;  Surgeon: Serene Gaile ORN, MD;  Location: [facility];  Service: Vascular;  Laterality: Left;   Family History  Problem Relation Age of Onset   Kidney failure Mother    Diabetes Mother    Hypertension Mother    Heart disease Mother    Glaucoma Father    Congestive Heart Failure Sister    Diabetes Sister    Kidney disease Sister    Diabetes Brother    Unexplained death Brother 69       Automobile accident   Hypothyroidism Daughter     Arthritis Daughter        Neck/Back   Healthy Son    HIV/AIDS Brother    HIV Daughter    Kidney disease Daughter    Arthritis Son        Knee   Colon cancer Neg Hx    Colon polyps Neg Hx    Esophageal cancer Neg Hx    Rectal cancer Neg Hx    Stomach cancer Neg Hx    Social History   Socioeconomic History   Marital status: Widowed    Spouse name: Not on file   Number of children: 4   Years of education: 2y college   Highest education level: Not on file  Occupational History   Occupation: retired    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   Tobacco Use   Smoking status: Every Day    Current packs/day: 0.30    Average packs/day: 0.3 packs/day for 50.0 years (15.0 ttl pk-yrs)    Types: Cigarettes    Passive exposure: Current   Smokeless tobacco: Never   Tobacco comments:    5 cigs per day  Vaping Use   Vaping status: Never Used  Substance and Sexual Activity   Alcohol use: No    Alcohol/week: 0.0 standard drinks of alcohol    Comment: last drink of alcohol ~ 1977   Drug use: No   Sexual activity: Never  Other Topics Concern   Not on file  Social History Narrative   Lives alone in [place], [place]   Social Drivers of Health   Financial Resource Strain: Low Risk  (08/16/2022)   Overall Financial Resource Strain (CARDIA)    Difficulty of Paying Living Expenses: Not hard at all  Food Insecurity: No Food Insecurity (11/03/2023)   Hunger Vital Sign    Worried About Running Out of  Food in the Last Year: Never true    Ran Out of Food in the Last Year: Never true  Transportation Needs: No Transportation Needs (11/03/2023)   PRAPARE - Administrator, Civil Service (Medical): No    Lack of Transportation (Non-Medical): No  Physical Activity: Insufficiently Active (08/16/2022)   Exercise Vital Sign    Days of Exercise per Week: 1 day    Minutes of Exercise per Session: 10 min  Stress: Stress Concern Present (08/16/2022)   [employer] of  Occupational Health - Occupational Stress Questionnaire    Feeling of Stress : To some extent  Social Connections: Moderately Isolated (11/03/2023)   Social Connection and Isolation Panel    Frequency of Communication with Friends and Family: More than three times a week    Frequency of Social Gatherings with Friends and Family: More than three times a week    Attends Religious Services: More than 4 times per year    Active Member of [employer] or Organizations: No    Attends Banker Meetings: Never    Marital Status: Widowed  Intimate Partner Violence: Not At Risk (11/03/2023)   Humiliation, Afraid, Rape, and Kick questionnaire    Fear of Current or Ex-Partner: No    Emotionally Abused: No    Physically Abused: No    Sexually Abused: No       Objective:  BP (!) 219/83   Pulse 62   Ht 5' 6 (1.676 m)   Wt 117 lb (53.1 kg)   SpO2 94%   BMI 18.88 kg/m  Wt Readings from Last 3 Encounters:  12/18/23 117 lb (53.1 kg)  11/22/23 113 lb (51.3 kg)  11/08/23 126 lb (57.2 kg)   BMI Readings from Last 3 Encounters:  12/18/23 18.88 kg/m  11/22/23 18.24 kg/m  11/08/23 20.34 kg/m   SpO2 Readings from Last 3 Encounters:  12/18/23 94%  11/22/23 98%  11/03/23 95%    Physical Exam General: elderly woman, not in any acute distress, thin, frail Eyes: PERRL, no scleral icterus ENMT: oropharynx clear, good dentition, no oral lesions, mallampati score II Skin: warm, intact, no rashes Neck: JVD flat, ROM and lymph node assessment normal CV: RRR, systolic murmur, nl S1 and S2, no peripheral edema Resp: clear to auscultation bilaterally, no wheezes, rales, or rhonchi, normal effort, + clubbing Abdom: Normoactive bowel sounds, soft, nontender, nondistended, no hepatosplenomegaly Neuro: Awake alert oriented to person place time and situation  Diagnostic Review:  Last CBC Lab Results  Component Value Date   WBC 7.6 11/22/2023   HGB 13.4 11/22/2023   HCT 41.6 11/22/2023   MCV  101 (H) 11/22/2023   MCH 32.4 11/22/2023   RDW 12.2 11/22/2023   PLT 304 11/22/2023   Last metabolic panel Lab Results  Component Value Date   GLUCOSE 78 11/03/2023   NA 139 11/03/2023   K 3.8 11/03/2023   CL 113 (H) 11/03/2023   CO2 18 (L) 11/03/2023   BUN 8 11/03/2023   CREATININE 0.63 11/03/2023   GFRNONAA >60 11/03/2023   CALCIUM  8.3 (L) 11/03/2023   PHOS 2.4 (L) 12/28/2022   PROT 6.0 (L) 11/02/2023   ALBUMIN  3.2 (L) 11/02/2023   LABGLOB 3.3 03/16/2017   AGRATIO 1.3 03/16/2017   BILITOT 0.7 11/02/2023   ALKPHOS 53 11/02/2023   AST 15 11/02/2023   ALT 9 11/02/2023   ANIONGAP 8 11/03/2023   LDCT 10/23/23: IMPRESSION: 1. Spiculated and enlarging posterior segment right upper lobe nodule. Lung-RADS 4X,  highly suspicious. Additional imaging evaluation or consultation with Pulmonology or Thoracic Surgery recommended. These results will be called to the ordering clinician or representative by the Radiologist Assistant, and communication documented in the PACS or [employer]. 2. 4.0 cm ascending aortic aneurysm, unchanged. Recommend annual imaging followup by CTA or MRA. This recommendation follows 2010 ACCF/AHA/AATS/ACR/ASA/SCA/SCAI/SIR/STS/SVM Guidelines for the Diagnosis and Management of Patients with Thoracic Aortic Disease. Circulation. 2010; 121: Z733-z630. Aortic aneurysm NOS (ICD10-I71.9). 3. Aortic atherosclerosis (ICD10-I70.0). Coronary artery calcification. 4. Enlarged pulmonic trunk, indicative of pulmonary arterial hypertension. 5.  Emphysema (ICD10-J43.9).    Assessment & Plan:   Assessment & Plan Solitary pulmonary nodule  Nicotine  dependence with current use Smoking assessment and cessation counseling  Patient currently smoking:  I spent 6 minutes discussing the continuing health risks associated with smoking. I offered the patient tobacco cessation treatment options such as medication and behavioral interventions to help facilitate smoking  cessation. I do not advise e-cigarettes as a form of stopping smoking. I have informed the patient that we can assist and have options of nicotine  replacement therapy, provided smoking cessation education today, provided smoking cessation counseling, and provided cessation resources.  Smoking cessation counseling advised for:  CPT 99406 3 to 10 minutes  Centrilobular emphysema (HCC)   Orders Placed This Encounter  Procedures   Procedural/ Surgical Case Request: VIDEO BRONCHOSCOPY WITH ENDOBRONCHIAL NAVIGATION   NM PET Image Initial (PI) Skull Base To Thigh   NM PET SUPER D CT   Pulmonary function test   Assessment and Plan Assessment & Plan Enlarging right upper lobe lung nodule, suspicious for malignancy Enlarging 10 mm nodule in right upper lobe, suspicious for stage 1 lung cancer. Discussed PET CT and biopsy evaluation. Explained biopsy risks (<3% for infection, lung collapse, bleeding). Discussed surgical removal or radiation therapy based on lung function. - Order PET CT scan and Super D CT to assess for malignancy and pre-procedural planning - Will pursue navigational bronchoscopy with ION for tissue diagnosis of RUL SPN. - Stop Plavix  5 days before biopsy. - Stop aspirin  2 days before biopsy.  Emphysema Mild emphysema likely due to smoking. Occasional dyspnea reported. MMRC < 2, CAT < 10. No exacerbations. Class A. - Prescribe inhaler for use as needed, Albuterol  MDI 2 puffs every 4 hours.  Nicotine  dependence Long-term smoking, 10 cigarettes/day. Willing to attempt cessation. - Prescribe nicotine  patches, 1 patch per day. - Provide information for smoking cessation support via 1-800-QUIT-NOW.  Superficial vein pain, possible superficial thrombophlebitis Pain in superficial vein, likely superficial thrombophlebitis. - Advise warm compresses to alleviate pain.  Essential Hypertension: BP elevated in 200s. No sign/symptoms of hypertensive emergency. The patient notes long  term elevation in BP. I messaged internal medicine team to set her up for an appointment to discuss BP medications. They are working on scheduling her soon.   Return in about 4 weeks (around 01/15/2024).   I spent 50 minutes reviewing patient's chart including prior consultant notes, imaging, and PFTs as well as face-to-face with the patient, over half in discussion of the diagnosis and the importance of compliance with the treatment plan.   Caelum Federici, MD

## 2023-12-18 ENCOUNTER — Telehealth (HOSPITAL_BASED_OUTPATIENT_CLINIC_OR_DEPARTMENT_OTHER): Payer: Self-pay | Admitting: Pulmonary Disease

## 2023-12-18 ENCOUNTER — Encounter (HOSPITAL_BASED_OUTPATIENT_CLINIC_OR_DEPARTMENT_OTHER): Payer: Self-pay | Admitting: Pulmonary Disease

## 2023-12-18 ENCOUNTER — Ambulatory Visit (INDEPENDENT_AMBULATORY_CARE_PROVIDER_SITE_OTHER): Admitting: Pulmonary Disease

## 2023-12-18 VITALS — BP 246/87 | HR 62 | Ht 66.0 in | Wt 117.0 lb

## 2023-12-18 DIAGNOSIS — R911 Solitary pulmonary nodule: Secondary | ICD-10-CM | POA: Diagnosis not present

## 2023-12-18 DIAGNOSIS — F17200 Nicotine dependence, unspecified, uncomplicated: Secondary | ICD-10-CM

## 2023-12-18 DIAGNOSIS — J432 Centrilobular emphysema: Secondary | ICD-10-CM

## 2023-12-18 DIAGNOSIS — F17210 Nicotine dependence, cigarettes, uncomplicated: Secondary | ICD-10-CM

## 2023-12-18 DIAGNOSIS — I10 Essential (primary) hypertension: Secondary | ICD-10-CM

## 2023-12-18 MED ORDER — ALBUTEROL SULFATE HFA 108 (90 BASE) MCG/ACT IN AERS: 2.0000 | INHALATION_SPRAY | Freq: Four times a day (QID) | RESPIRATORY_TRACT | 2 refills | Status: DC | PRN

## 2023-12-18 MED ORDER — NICOTINE 7 MG/24HR TD PT24: MEDICATED_PATCH | TRANSDERMAL | 0 refills | Status: AC

## 2023-12-18 MED ORDER — NICOTINE 14 MG/24HR TD PT24: MEDICATED_PATCH | TRANSDERMAL | 0 refills | Status: AC

## 2023-12-18 NOTE — Patient Instructions (Addendum)
-   Call 1-800 QUIT NOW to sign up for smoking cessation. They should send you the nicotine  lozenges or gums. - 1 patch of nicotine  a day - Get your scans done - [facility] - I will set up the biopsy - [facility] - Come off plavix  5 days prior to procedure and aspirin  2 days prior to procedure

## 2023-12-18 NOTE — Telephone Encounter (Signed)
 Please schedule the following:  Provider performing procedure:Byrum/Dewald/Tresean Mattix Diagnosis: RUL solitary pulmonary nodule Which side if for nodule / mass? right Procedure: Ion navigation bronchoscopy  Has patient been spoken to by Provider and given informed consent? Yes Anesthesia: General Do you need Fluro? Yes Duration of procedure: 1.5 hours Date: 10/14 Alternate Date: 10/16 Alternate Date: 10/20 Alternate Date: 10/21 Time: PM Time preferrable Location: [facility] Does patient have OSA? N/A DM? N/A Or Latex allergy? NO Medication Restriction/ Anticoagulate/Antiplatelet: Plavix  and Aspirin . Need to stop plavix  5 days prior to procedure Pre-op Labs Ordered:determined by Anesthesia Imaging request: PET/CT and Super D ordered  (If, SuperDimension CT Chest, please have STAT courier sent to ENDO)

## 2023-12-20 NOTE — Telephone Encounter (Signed)
 Jessica [name] scheduled will send to [facility] to check auth

## 2023-12-21 ENCOUNTER — Encounter (HOSPITAL_COMMUNITY)
Admission: RE | Admit: 2023-12-21 | Discharge: 2023-12-21 | Disposition: A | Discharge status: Home / Self Care | Source: Ambulatory Visit | Attending: Pulmonary Disease | Admitting: Pulmonary Disease

## 2023-12-21 DIAGNOSIS — I2510 Atherosclerotic heart disease of native coronary artery without angina pectoris: Secondary | ICD-10-CM | POA: Diagnosis not present

## 2023-12-21 DIAGNOSIS — I700 Atherosclerosis of aorta: Secondary | ICD-10-CM | POA: Diagnosis not present

## 2023-12-21 DIAGNOSIS — R911 Solitary pulmonary nodule: Secondary | ICD-10-CM | POA: Insufficient documentation

## 2023-12-21 DIAGNOSIS — J432 Centrilobular emphysema: Secondary | ICD-10-CM | POA: Diagnosis not present

## 2023-12-21 LAB — GLUCOSE, CAPILLARY: Glucose-Capillary: 85 mg/dL (ref 70–99)

## 2023-12-21 MED ORDER — FLUDEOXYGLUCOSE F - 18 (FDG) INJECTION
5.8500 | Freq: Once | INTRAVENOUS | Status: AC
  Administered 2023-12-21: 5.73 via INTRAVENOUS

## 2023-12-23 ENCOUNTER — Other Ambulatory Visit: Payer: Self-pay | Admitting: Internal Medicine

## 2023-12-23 DIAGNOSIS — I10 Essential (primary) hypertension: Secondary | ICD-10-CM

## 2023-12-23 MED ORDER — AMLODIPINE BESYLATE 10 MG PO TABS: 10.0000 mg | ORAL_TABLET | Freq: Every day | ORAL | 11 refills | Status: AC

## 2023-12-23 NOTE — Progress Notes (Unsigned)
 SBP has risen considerably; amlodipine  10 mg had been stopped due to low Bps. Lung nodule biopsy 10/21; Bp must be better controlled. Refill for amlodipine  sent to [facility]. Pt needs office visit or at least nurse BP recheck to ensure control before biopsy. She is scheduled with me on 10/23 (unfortunately after the fact). 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  Son is new to caregiving role, medication/pharmacy/refills, etc for his mother.  Daughter, who managed meds before, has passed away.

## 2023-12-24 ENCOUNTER — Telehealth: Payer: Self-pay | Admitting: *Deleted

## 2023-12-24 NOTE — Telephone Encounter (Signed)
 Call to patient message was left on son's voice mail to pick up medication for patient and to have patient start as soon as possible.   Call to Patient's home spoke with nephew and patient-informed patient that Amlodipine  has been sent to her Pharmacy.  She will need to have it picked up as soon as possible and to start taking for her elevated blood pressure.  Patient was also informed that she will need to come in before her procedure on 10/21/for a B/P check or a doctors visit.  {Patient voiced understanding of and was transferred to the front to schedule an appointment.

## 2023-12-26 ENCOUNTER — Other Ambulatory Visit: Payer: Self-pay

## 2023-12-26 ENCOUNTER — Encounter (INDEPENDENT_AMBULATORY_CARE_PROVIDER_SITE_OTHER): Payer: Self-pay | Admitting: Gastroenterology

## 2023-12-26 ENCOUNTER — Ambulatory Visit (INDEPENDENT_AMBULATORY_CARE_PROVIDER_SITE_OTHER)

## 2023-12-26 VITALS — BP 146/76 | HR 62 | Temp 98.4°F | Ht 66.0 in | Wt 113.8 lb

## 2023-12-26 DIAGNOSIS — Z23 Encounter for immunization: Secondary | ICD-10-CM | POA: Diagnosis not present

## 2023-12-26 DIAGNOSIS — F17210 Nicotine dependence, cigarettes, uncomplicated: Secondary | ICD-10-CM

## 2023-12-26 DIAGNOSIS — I10 Essential (primary) hypertension: Secondary | ICD-10-CM | POA: Diagnosis not present

## 2023-12-26 DIAGNOSIS — Z8249 Family history of ischemic heart disease and other diseases of the circulatory system: Secondary | ICD-10-CM

## 2023-12-26 DIAGNOSIS — Z79899 Other long term (current) drug therapy: Secondary | ICD-10-CM

## 2023-12-26 NOTE — Progress Notes (Signed)
   Established Patient Office Visit  Subjective   Patient ID: Jessica [name], female    DOB: Apr 07, 1940  Age: 83 y.o. MRN: [id number]  Chief Complaint  Patient presents with   Follow-up    Routine office visit with medication refill / bp check and flu shot   Jessica [name]'s is an 83yo F with a PMH of HIV, HTN, PAD, GAD, glaucoma, and a RUL lung nodule who presents today for blood pressure follow up.    Review of Systems  Constitutional:  Negative for chills, fever and weight loss.  Eyes:  Negative for pain and discharge.  Respiratory:  Negative for cough, shortness of breath and wheezing.   Cardiovascular:  Negative for chest pain, palpitations and leg swelling.  Gastrointestinal:  Negative for abdominal pain, diarrhea, nausea and vomiting.  Neurological:  Negative for dizziness, tingling, sensory change and headaches.      Objective:     BP (!) 146/76 (BP Location: Left Arm, Patient Position: Sitting, Cuff Size: Normal)   Pulse 62   Temp 98.4 F (36.9 C) (Oral)   Ht 5' 6 (1.676 m)   Wt 113 lb 12.8 oz (51.6 kg)   SpO2 93%   BMI 18.37 kg/m     Const: Awake, alert in NAD HENT: Normocephalic, atraumatic, mucus membranes moist Card: RRR, No MRG, No pitting edema on LE's bilaterally  Resp: LCTAB, no increased work of breathing Abd: Soft, NTND Extremities: Warm, pink   No results found for any visits on 12/26/23.  The ASCVD Risk score (Arnett DK, et al., 2019) failed to calculate for the following reasons:   The 2019 ASCVD risk score is only valid for ages 54 to 67    Assessment & Plan:   Assessment & Plan Essential hypertension The patient's blood pressure during her last visit was 246/87. She was initiated on amlodipine  on 10/12 and has taken it for two days previous to this visit. Today, her BP is 146/76. With significant improvement in systolic blood pressure and the very recent initiation of amlodipine , We will continue current management of amlodipine  10  and benazepril  40 without changes. I would recommend close follow up in 4 weeks to ensure her blood pressure is being adequately controlled.        Schuyler Novak, DO

## 2023-12-28 NOTE — Assessment & Plan Note (Signed)
 The patient's blood pressure during her last visit was 246/87. She was initiated on amlodipine  on 10/12 and has taken it for two days previous to this visit. Today, her BP is 146/76. With significant improvement in systolic blood pressure and the very recent initiation of amlodipine , We will continue current management of amlodipine  10 and benazepril  40 without changes. I would recommend close follow up in 4 weeks to ensure her blood pressure is being adequately controlled.

## 2023-12-31 ENCOUNTER — Other Ambulatory Visit: Payer: Self-pay

## 2023-12-31 ENCOUNTER — Other Ambulatory Visit: Payer: Self-pay | Admitting: *Deleted

## 2023-12-31 ENCOUNTER — Encounter (HOSPITAL_COMMUNITY): Payer: Self-pay | Admitting: Emergency Medicine

## 2023-12-31 DIAGNOSIS — F5102 Adjustment insomnia: Secondary | ICD-10-CM

## 2023-12-31 DIAGNOSIS — M1612 Unilateral primary osteoarthritis, left hip: Secondary | ICD-10-CM

## 2023-12-31 DIAGNOSIS — F411 Generalized anxiety disorder: Secondary | ICD-10-CM

## 2023-12-31 NOTE — Progress Notes (Addendum)
 PCP - Trudy Mliss Dragon, MD  Cardiologist - denies  PPM/ICD - denies Device Orders - n/a Rep Notified - n/a  Chest x-ray - denies EKG - 09-27-23 Stress Test - denies ECHO - 07-13-20 Cardiac Cath - denies  CPAP - n/a  DM -denies  Blood Thinner Instructions: clopidogrel  (PLAVIX ) LAST DOSE per patient she has not taken plavix  in a while only when she takes injections in her left hip for arthritis. Aspirin  Instructions: LAST DOSE 12-26-23  ERAS Protcol - NPO  COVID TEST- n/a  Anesthesia review: Yes, HTN, heart murmur. Per patient she has had heart murmur since she was 18.  Patient verbally denies any shortness of breath, fever, cough and chest pain during phone call   -------------  SDW INSTRUCTIONS given:  Your procedure is scheduled on January 01, 2024.  Report to [facility] Main Entrance A at 10:45 A.M., and check in at the Admitting office.  Call this number if you have problems the morning of surgery:  [phone]   Remember:  Do not eat or drink after midnight the night before your surgery      Take these medicines the morning of surgery with A SIP OF WATER  acyclovir  (ZOVIRAX )  albuterol  (VENTOLIN  HFA)  inhaler  amLODipine  (NORVASC )  (BIKTARVY )  brimonidine  (ALPHAGAN )  dorzolamide -timolol  (COSOPT )  gabapentin  (NEURONTIN )  (PERCOCET/ROXICET)     As of today, STOP taking any Aspirin  (unless otherwise instructed by your surgeon) Aleve, Naproxen, Ibuprofen , Motrin , Advil , Goody's, BC's, all herbal medications, fish oil, and all vitamins. THIS INCLUDES YOUR diclofenac  Sodium (VOLTAREN ) 1 % GEL,                       Do not wear jewelry, make up, or nail polish            Do not wear lotions, powders, perfumes/colognes, or deodorant.            Do not shave 48 hours prior to surgery.  Men may shave face and neck.            Do not bring valuables to the hospital.            [facility] is not responsible for any belongings or valuables.  Do NOT Smoke  (Tobacco/Vaping) 24 hours prior to your procedure If you use a CPAP at night, you may bring all equipment for your overnight stay.   Contacts, glasses, dentures or bridgework may not be worn into surgery.      For patients admitted to the hospital, discharge time will be determined by your treatment team.   Patients discharged the day of surgery will not be allowed to drive home, and someone needs to stay with them for 24 hours.    Special instructions:   [place]- Preparing For Surgery  Before surgery, you can play an important role. Because skin is not sterile, your skin needs to be as free of germs as possible. You can reduce the number of germs on your skin by washing with CHG (chlorahexidine gluconate) Soap before surgery.  CHG is an antiseptic cleaner which kills germs and bonds with the skin to continue killing germs even after washing.    Oral Hygiene is also important to reduce your risk of infection.  Remember - BRUSH YOUR TEETH THE MORNING OF SURGERY WITH YOUR REGULAR TOOTHPASTE  Please do not use if you have an allergy to CHG or antibacterial soaps. If your skin becomes reddened/irritated stop using  the CHG.  Do not shave (including legs and underarms) for at least 48 hours prior to first CHG shower. It is OK to shave your face.  Please follow these instructions carefully.   Shower the NIGHT BEFORE SURGERY and the MORNING OF SURGERY with DIAL Soap.   Pat yourself dry with a CLEAN TOWEL.  Wear CLEAN PAJAMAS to bed the night before surgery  Place CLEAN SHEETS on your bed the night of your first shower and DO NOT SLEEP WITH PETS.   Day of Surgery: Please shower morning of surgery  Wear Clean/Comfortable clothing the morning of surgery Do not apply any deodorants/lotions.   Remember to brush your teeth WITH YOUR REGULAR TOOTHPASTE.   Questions were answered. Patient verbalized understanding of instructions.

## 2023-12-31 NOTE — Progress Notes (Signed)
 Internal Medicine Clinic Attending  I was physically present during the key portions of the resident provided service and participated in the medical decision making of patient's management care. I reviewed pertinent patient test results.  The assessment, diagnosis, and plan were formulated together and I agree with the documentation in the resident's note.  Francesco Elsie NOVAK, MD

## 2023-12-31 NOTE — Progress Notes (Signed)
 Anesthesia Chart Review: Same day workup  83 year old female with pertinent history including current smoker with associated emphysema, hypertension, glaucoma, anemia, blindness, HIV on antivirals, PAD s/p aortobifemoral bypass 2009 and redo left femoral artery exposure and thrombectomy of left limb of AVF/profunda/SFA 2022, chronic pain, Ekbom's delusional parasitosis.  Recently evaluated by pulmonology for enlarging right upper lobe lung nodule.  Bronchoscopy and biopsy recommended.  She was instructed by pulmonology to stop Plavix  5 days before biopsy and aspirin  2 days before biopsy.    Echo 07/2020 showed LVEF 60 to 65%, grade 1 DD, normal RV systolic function, mild aortic regurgitation.  She will need day of surgery labs and evaluation.  EKG 09/27/2023: Sinus bradycardia.  Rate 55. Septal infarct , age undetermined  PET CT 12/21/23: IMPRESSION: Hypermetabolic right upper lobe nodule. Given the presence of fat attenuation on same day dedicated chest CT findings may represent a hamartoma, although uncommon increased activity may be seen on PET-CT of hamartomas. Therefore further investigation with PET-CT is confusing. Bronchogenic carcinoma can not be completely excluded. Recommend tissue sampling.   FDG avid ipsilateral hilar and mediastinal lymph nodes, metastatic versus reactive.   Hypermetabolic left thyroid  lobe nodule increases the chance of primary malignancy. Recommend ultrasound for further assessment.   Diffuse uptake along the aortobifemoral stent graft, likely inflammatory.  TTE 07/13/2020:  1. Left ventricular ejection fraction, by estimation, is 60 to 65%. The  left ventricle has normal function. The left ventricle has no regional  wall motion abnormalities. Left ventricular diastolic parameters are  consistent with Grade I diastolic  dysfunction (impaired relaxation).   2. Right ventricular systolic function is normal. The right ventricular  size is normal. There  is normal pulmonary artery systolic pressure. The  estimated right ventricular systolic pressure is 32.4 mmHg.   3. The mitral valve is normal in structure. Moderate mitral valve  regurgitation. No evidence of mitral stenosis.   4. The aortic valve is tricuspid. There is moderate calcification of the  aortic valve. There is moderate thickening of the aortic valve. Aortic  valve regurgitation is mild. Mild to moderate aortic valve  sclerosis/calcification is present, without any  evidence of aortic stenosis. Aortic regurgitation PHT measures 830 msec.  Aortic valve mean gradient measures 9.0 mmHg. Aortic valve Vmax measures  2.07 m/s.   5. The inferior vena cava is normal in size with greater than 50%  respiratory variability, suggesting right atrial pressure of 3 mmHg.      Lynwood Geofm RIGGERS [facility]/Anesthesiology Phone [phone] 12/31/2023 4:15 PM

## 2023-12-31 NOTE — Anesthesia Preprocedure Evaluation (Signed)
 Anesthesia Evaluation  Patient identified by MRN, date of birth, ID band Patient awake    Reviewed: Allergy & Precautions, NPO status , Patient's Chart, lab work & pertinent test results  History of Anesthesia Complications Negative for: history of anesthetic complications  Airway Mallampati: II  TM Distance: >3 FB Neck ROM: Full    Dental  (+) Dental Advisory Given, Chipped   Pulmonary Current Smoker   Pulmonary exam normal        Cardiovascular hypertension, + Peripheral Vascular Disease  Normal cardiovascular exam   '22 TTE - EF 60 to 65%. Grade I diastolic dysfunction (impaired relaxation). Moderate mitral valve regurgitation. Aortic valve regurgitation is mild. Mild to moderate aortic valve sclerosis/calcification is present, without any evidence of aortic stenosis.      Neuro/Psych  Headaches PSYCHIATRIC DISORDERS Anxiety Depression     Tinnitus, hearing loss Blindness     GI/Hepatic negative GI ROS, Neg liver ROS,,,  Endo/Other  negative endocrine ROS    Renal/GU negative Renal ROS     Musculoskeletal  (+) Arthritis ,    Abdominal   Peds  Hematology  (+) HIV On plavix     Anesthesia Other Findings HSV  Reproductive/Obstetrics                              Anesthesia Physical Anesthesia Plan  ASA: 3  Anesthesia Plan: General   Post-op Pain Management: Tylenol  PO (pre-op)* and Minimal or no pain anticipated   Induction: Intravenous  PONV Risk Score and Plan: 2 and Treatment may vary due to age or medical condition, Ondansetron  and Propofol  infusion  Airway Management Planned: Oral ETT  Additional Equipment: None  Intra-op Plan:   Post-operative Plan: Extubation in OR  Informed Consent: I have reviewed the patients History and Physical, chart, labs and discussed the procedure including the risks, benefits and alternatives for the proposed anesthesia with the patient  or authorized representative who has indicated his/her understanding and acceptance.     Dental advisory given  Plan Discussed with: CRNA and Anesthesiologist  Anesthesia Plan Comments: (PAT note by Lynwood Hope, PA-C)         Anesthesia Quick Evaluation

## 2024-01-01 ENCOUNTER — Encounter (HOSPITAL_COMMUNITY): Payer: Self-pay | Admitting: Emergency Medicine

## 2024-01-01 ENCOUNTER — Encounter (HOSPITAL_COMMUNITY)
Admission: RE | Disposition: A | Discharge status: Home / Self Care | Payer: Self-pay | Source: Home / Self Care | Attending: Emergency Medicine

## 2024-01-01 ENCOUNTER — Ambulatory Visit (HOSPITAL_COMMUNITY)

## 2024-01-01 ENCOUNTER — Ambulatory Visit: Payer: Self-pay | Admitting: Pulmonary Disease

## 2024-01-01 ENCOUNTER — Ambulatory Visit (HOSPITAL_COMMUNITY): Admitting: Physician Assistant

## 2024-01-01 ENCOUNTER — Other Ambulatory Visit: Payer: Self-pay

## 2024-01-01 ENCOUNTER — Ambulatory Visit (HOSPITAL_COMMUNITY)
Admission: RE | Admit: 2024-01-01 | Discharge: 2024-01-01 | Disposition: A | Discharge status: Home / Self Care | Attending: Emergency Medicine | Admitting: Emergency Medicine

## 2024-01-01 ENCOUNTER — Ambulatory Visit (HOSPITAL_BASED_OUTPATIENT_CLINIC_OR_DEPARTMENT_OTHER): Admitting: Physician Assistant

## 2024-01-01 DIAGNOSIS — M810 Age-related osteoporosis without current pathological fracture: Secondary | ICD-10-CM | POA: Insufficient documentation

## 2024-01-01 DIAGNOSIS — Z888 Allergy status to other drugs, medicaments and biological substances status: Secondary | ICD-10-CM | POA: Insufficient documentation

## 2024-01-01 DIAGNOSIS — F17210 Nicotine dependence, cigarettes, uncomplicated: Secondary | ICD-10-CM | POA: Insufficient documentation

## 2024-01-01 DIAGNOSIS — I739 Peripheral vascular disease, unspecified: Secondary | ICD-10-CM | POA: Insufficient documentation

## 2024-01-01 DIAGNOSIS — I10 Essential (primary) hypertension: Secondary | ICD-10-CM | POA: Diagnosis not present

## 2024-01-01 DIAGNOSIS — Z801 Family history of malignant neoplasm of trachea, bronchus and lung: Secondary | ICD-10-CM | POA: Insufficient documentation

## 2024-01-01 DIAGNOSIS — H547 Unspecified visual loss: Secondary | ICD-10-CM | POA: Insufficient documentation

## 2024-01-01 DIAGNOSIS — H409 Unspecified glaucoma: Secondary | ICD-10-CM | POA: Insufficient documentation

## 2024-01-01 DIAGNOSIS — Z7982 Long term (current) use of aspirin: Secondary | ICD-10-CM | POA: Insufficient documentation

## 2024-01-01 DIAGNOSIS — J432 Centrilobular emphysema: Secondary | ICD-10-CM | POA: Diagnosis not present

## 2024-01-01 DIAGNOSIS — C3411 Malignant neoplasm of upper lobe, right bronchus or lung: Secondary | ICD-10-CM | POA: Insufficient documentation

## 2024-01-01 DIAGNOSIS — Z48813 Encounter for surgical aftercare following surgery on the respiratory system: Secondary | ICD-10-CM | POA: Diagnosis not present

## 2024-01-01 DIAGNOSIS — Z7902 Long term (current) use of antithrombotics/antiplatelets: Secondary | ICD-10-CM | POA: Diagnosis not present

## 2024-01-01 DIAGNOSIS — Z95828 Presence of other vascular implants and grafts: Secondary | ICD-10-CM | POA: Diagnosis not present

## 2024-01-01 DIAGNOSIS — F418 Other specified anxiety disorders: Secondary | ICD-10-CM

## 2024-01-01 DIAGNOSIS — Z21 Asymptomatic human immunodeficiency virus [HIV] infection status: Secondary | ICD-10-CM | POA: Diagnosis not present

## 2024-01-01 DIAGNOSIS — R911 Solitary pulmonary nodule: Secondary | ICD-10-CM | POA: Diagnosis not present

## 2024-01-01 DIAGNOSIS — I700 Atherosclerosis of aorta: Secondary | ICD-10-CM | POA: Diagnosis not present

## 2024-01-01 DIAGNOSIS — Z79899 Other long term (current) drug therapy: Secondary | ICD-10-CM | POA: Insufficient documentation

## 2024-01-01 HISTORY — DX: Depression, unspecified: F32.A

## 2024-01-01 HISTORY — PX: VIDEO BRONCHOSCOPY WITH ENDOBRONCHIAL NAVIGATION: SHX6175

## 2024-01-01 HISTORY — PX: VIDEO BRONCHOSCOPY WITH ENDOBRONCHIAL ULTRASOUND: SHX6177

## 2024-01-01 LAB — POCT I-STAT, CHEM 8
BUN: 17 mg/dL (ref 8–23)
Calcium, Ion: 1.19 mmol/L (ref 1.15–1.40)
Chloride: 113 mmol/L — ABNORMAL HIGH (ref 98–111)
Creatinine, Ser: 0.6 mg/dL (ref 0.44–1.00)
Glucose, Bld: 84 mg/dL (ref 70–99)
HCT: 41 % (ref 36.0–46.0)
Hemoglobin: 13.9 g/dL (ref 12.0–15.0)
Potassium: 3.2 mmol/L — ABNORMAL LOW (ref 3.5–5.1)
Sodium: 146 mmol/L — ABNORMAL HIGH (ref 135–145)
TCO2: 21 mmol/L — ABNORMAL LOW (ref 22–32)

## 2024-01-01 SURGERY — VIDEO BRONCHOSCOPY WITH ENDOBRONCHIAL NAVIGATION
Anesthesia: General | Laterality: Right

## 2024-01-01 MED ORDER — EPHEDRINE SULFATE-NACL 50-0.9 MG/10ML-% IV SOSY
PREFILLED_SYRINGE | INTRAVENOUS | Status: DC | PRN
  Administered 2024-01-01: 10 mg via INTRAVENOUS
  Administered 2024-01-01 (×2): 5 mg via INTRAVENOUS

## 2024-01-01 MED ORDER — SUGAMMADEX SODIUM 200 MG/2ML IV SOLN
INTRAVENOUS | Status: DC | PRN
  Administered 2024-01-01: 200 mg via INTRAVENOUS

## 2024-01-01 MED ORDER — LABETALOL HCL 5 MG/ML IV SOLN: 5.0000 mg | INTRAVENOUS | Status: DC | PRN

## 2024-01-01 MED ORDER — LACTATED RINGERS IV SOLN: INTRAVENOUS | Status: DC

## 2024-01-01 MED ORDER — ROCURONIUM BROMIDE 10 MG/ML (PF) SYRINGE
PREFILLED_SYRINGE | INTRAVENOUS | Status: DC | PRN
  Administered 2024-01-01: 40 mg via INTRAVENOUS

## 2024-01-01 MED ORDER — FENTANYL CITRATE (PF) 100 MCG/2ML IJ SOLN: 25.0000 ug | INTRAMUSCULAR | Status: DC | PRN

## 2024-01-01 MED ORDER — ACETAMINOPHEN 500 MG PO TABS: 1000.0000 mg | ORAL_TABLET | Freq: Once | ORAL | Status: DC

## 2024-01-01 MED ORDER — ONDANSETRON HCL 4 MG/2ML IJ SOLN
INTRAMUSCULAR | Status: DC | PRN
  Administered 2024-01-01: 4 mg via INTRAVENOUS

## 2024-01-01 MED ORDER — PROPOFOL 10 MG/ML IV BOLUS
INTRAVENOUS | Status: DC | PRN
  Administered 2024-01-01: 70 mg via INTRAVENOUS
  Administered 2024-01-01: 130 mg via INTRAVENOUS

## 2024-01-01 MED ORDER — ONDANSETRON HCL 4 MG/2ML IJ SOLN: 4.0000 mg | Freq: Once | INTRAMUSCULAR | Status: DC | PRN

## 2024-01-01 MED ORDER — LABETALOL HCL 5 MG/ML IV SOLN
INTRAVENOUS | Status: DC | PRN
Start: 2024-01-01 — End: 2024-01-01
  Administered 2024-01-01 (×2): 5 mg via INTRAVENOUS

## 2024-01-01 MED ORDER — OXYCODONE HCL 5 MG/5ML PO SOLN: 5.0000 mg | Freq: Once | ORAL | Status: DC | PRN

## 2024-01-01 MED ORDER — OXYCODONE HCL 5 MG PO TABS: 5.0000 mg | ORAL_TABLET | Freq: Once | ORAL | Status: DC | PRN

## 2024-01-01 SURGICAL SUPPLY — 37 items
ADAPTER BRONCHOSCOPE OLYMPUS (ADAPTER) ×2 IMPLANT
ADAPTER VALVE BIOPSY EBUS (MISCELLANEOUS) IMPLANT
BAG COUNTER SPONGE SURGICOUNT (BAG) ×2 IMPLANT
BRUSH CYTOL CELLEBRITY 1.5X140 (MISCELLANEOUS) ×2 IMPLANT
BRUSH SUPERTRAX BIOPSY (INSTRUMENTS) IMPLANT
BRUSH SUPERTRAX NDL-TIP CYTO (INSTRUMENTS) ×2 IMPLANT
CANISTER SUCTION 3000ML PPV (SUCTIONS) ×2 IMPLANT
CNTNR URN SCR LID CUP LEK RST (MISCELLANEOUS) ×2 IMPLANT
COVER BACK TABLE 60X90IN (DRAPES) ×2 IMPLANT
FILTER STRAW FLUID ASPIR (MISCELLANEOUS) IMPLANT
FORCEPS BIOP 1.5 SINGLE USE (MISCELLANEOUS) ×2 IMPLANT
FORCEPS BIOP SUPERTRX PREMAR (INSTRUMENTS) ×2 IMPLANT
GAUZE SPONGE 4X4 12PLY STRL (GAUZE/BANDAGES/DRESSINGS) ×2 IMPLANT
GLOVE BIO SURGEON STRL SZ7.5 (GLOVE) ×4 IMPLANT
GOWN STRL REUS W/ TWL LRG LVL3 (GOWN DISPOSABLE) ×4 IMPLANT
KIT CLEAN ENDO COMPLIANCE (KITS) ×2 IMPLANT
KIT LOCATABLE GUIDE (CANNULA) IMPLANT
KIT MARKER FIDUCIAL DELIVERY (KITS) IMPLANT
KIT TURNOVER KIT B (KITS) ×2 IMPLANT
MARKER SKIN DUAL TIP RULER LAB (MISCELLANEOUS) ×2 IMPLANT
NDL SUPERTRX PREMARK BIOPSY (NEEDLE) ×2 IMPLANT
NEEDLE SUPERTRX PREMARK BIOPSY (NEEDLE) ×2 IMPLANT
OIL SILICONE PENTAX (PARTS (SERVICE/REPAIRS)) ×2 IMPLANT
PAD ARMBOARD POSITIONER FOAM (MISCELLANEOUS) ×4 IMPLANT
PATCHES PATIENT (LABEL) ×6 IMPLANT
SOLN 0.9% NACL POUR BTL 1000ML (IV SOLUTION) ×2 IMPLANT
SOLN STERILE WATER BTL 1000 ML (IV SOLUTION) ×2 IMPLANT
SYR 20ML ECCENTRIC (SYRINGE) ×2 IMPLANT
SYR 20ML LL LF (SYRINGE) ×2 IMPLANT
SYR 50ML SLIP (SYRINGE) ×2 IMPLANT
TOWEL GREEN STERILE FF (TOWEL DISPOSABLE) ×2 IMPLANT
TRAP SPECIMEN MUCUS 40CC (MISCELLANEOUS) IMPLANT
TUBE CONNECTING 20X1/4 (TUBING) ×2 IMPLANT
UNDERPAD 30X36 HEAVY ABSORB (UNDERPADS AND DIAPERS) ×2 IMPLANT
VALVE BIOPSY SINGLE USE (MISCELLANEOUS) ×2 IMPLANT
VALVE SUCTION BRONCHIO DISP (MISCELLANEOUS) ×2 IMPLANT
superlock fiducial marker IMPLANT

## 2024-01-01 NOTE — Transfer of Care (Signed)
 Immediate Anesthesia Transfer of Care Note  Patient: Jessica [name]  Procedure(s) Performed: VIDEO BRONCHOSCOPY WITH ENDOBRONCHIAL NAVIGATION (Right) BRONCHOSCOPY, WITH EBUS  Patient Location: PACU  Anesthesia Type:General  Level of Consciousness: awake, alert , and oriented  Airway & Oxygen  Therapy: Patient Spontanous Breathing  Post-op Assessment: Report given to RN and Post -op Vital signs reviewed and stable  Post vital signs: Reviewed and stable  Last Vitals:  Vitals Value Taken Time  BP 165/90 01/01/24 13:03  Temp    Pulse 66 01/01/24 13:06  Resp 23 01/01/24 13:06  SpO2 97 % 01/01/24 13:06  Vitals shown include unfiled device data.  Last Pain:  Vitals:   01/01/24 1116  TempSrc:   PainSc: 8       Patients Stated Pain Goal: 2 (01/01/24 1116)  Complications: No notable events documented.

## 2024-01-01 NOTE — Anesthesia Postprocedure Evaluation (Signed)
 Anesthesia Post Note  Patient: Jessica [name]  Procedure(s) Performed: VIDEO BRONCHOSCOPY WITH ENDOBRONCHIAL NAVIGATION (Right) BRONCHOSCOPY, WITH EBUS     Patient location during evaluation: PACU Anesthesia Type: General Level of consciousness: awake and alert Pain management: pain level controlled Vital Signs Assessment: post-procedure vital signs reviewed and stable Respiratory status: spontaneous breathing, nonlabored ventilation and respiratory function stable Cardiovascular status: stable and blood pressure returned to baseline Anesthetic complications: no   No notable events documented.  Last Vitals:  Vitals:   01/01/24 1330 01/01/24 1345  BP: (!) 148/63 129/81  Pulse: 62 67  Resp: 13 18  Temp:    SpO2: 95% 97%    Last Pain:  Vitals:   01/01/24 1330  TempSrc:   PainSc: 0-No pain                 Debby FORBES Like

## 2024-01-01 NOTE — Discharge Instructions (Signed)

## 2024-01-01 NOTE — Interval H&P Note (Signed)
 History and Physical Interval Note:  01/01/2024 10:47 AM  Jessica [name]  has presented today for surgery, with the diagnosis of RUL nodule.  The various methods of treatment have been discussed with the patient and family. After consideration of risks, benefits and other options for treatment, the patient has consented to  Procedure(s): VIDEO BRONCHOSCOPY WITH ENDOBRONCHIAL NAVIGATION (Right) as a surgical intervention.  The patient's history has been reviewed, patient examined, no change in status, stable for surgery.  I have reviewed the patient's chart and labs.  Questions were answered to the patient's satisfaction.     [name]

## 2024-01-01 NOTE — Progress Notes (Signed)
 Dr. Lucious made aware of patient's elevated BP readings upon arrival to Short Stay. 220/72, 208/76, 193/74, 178/65. Ok per Dr. Lucious. No new orders received.

## 2024-01-01 NOTE — Op Note (Addendum)
 Procedure Note  Patient: Jessica [name] [facility]  [employer] Cios mobile C-arm was utilized to identify and biopsy RUL solitary pulmonary nodule.  Needle-in-lesion was confirmed using real-time Cios imaging, and images were uploaded to PACS.    Paula Southerly, MD [place] [facility]

## 2024-01-01 NOTE — Op Note (Addendum)
 Video Bronchoscopy with Robotic Assisted Bronchoscopic Navigation   Date of Operation: 01/01/2024   Pre-op Diagnosis: RUL solitary pulmonary nodule  Post-op Diagnosis: Same  Surgeon: Paula Southerly, MD  Assistants: Lamar Chris, MD PhD  Anesthesia: General endotracheal anesthesia  Operation: Flexible video fiberoptic bronchoscopy with robotic assistance and biopsies.  Estimated Blood Loss: Minimal  Complications: None  Indications and History: Jessica [name] is a 83 y.o. female with history of RUL solitary pulmonary nodule.  Recommendation made to achieve a tissue diagnosis via robotic assisted navigational bronchoscopy.  The risks, benefits, complications, treatment options and expected outcomes were discussed with the patient.  The possibilities of pneumothorax, pneumonia, reaction to medication, pulmonary aspiration, perforation of a viscus, bleeding, failure to diagnose a condition and creating a complication requiring transfusion or operation were discussed with the patient who freely signed the consent.    Description of Procedure: The patient was seen in the Preoperative Area, was examined and was deemed appropriate to proceed.  The patient was taken to [facility] Endoscopy room 3, identified as Jessica [name] and the procedure verified as Flexible Video Fiberoptic Bronchoscopy.  A Time Out was held and the above information confirmed.   Prior to the date of the procedure a high-resolution CT scan of the chest was performed. Utilizing ION software program a virtual tracheobronchial tree was generated to allow the creation of distinct navigation pathways to the patient's parenchymal abnormalities. After being taken to the operating room general anesthesia was initiated and the patient  was orally intubated. The video fiberoptic bronchoscope was introduced via the endotracheal tube and a general inspection was performed which showed normal right and left lung anatomy.  Aspiration of the bilateral mainstems was completed to remove any remaining secretions. Robotic catheter inserted into patient's endotracheal tube.   Target #1 RUL solitary pulmonary nodule: The distinct navigation pathways prepared prior to this procedure were then utilized to navigate to patient's lesion identified on CT scan. The robotic catheter was secured into place and the vision probe was withdrawn.  Lesion location was approximated using fluoroscopy.  Local registration and targeting was performed using [employer] Cios mobile C-arm three-dimensional imaging. Under fluoroscopic guidance transbronchial needle brushings, transbronchial needle biopsies, and transbronchial forceps biopsies were performed to be sent for cytology and pathology.  A fudicial was left at the right upper lobe solitary pulmonary nodule. Needle-in-lesion was confirmed using Cios mobile C-arm. Under fluoroscopic guidance a single fiducial marker was placed adjacent to the nodule.  Linear EBUS was utilized to evaluate lymphadenopathy. 11 L, 4 L, 7, 4R, and 11 R evaluated. They are all < 5 mm in size. None were biopsied.  At the end of the procedure a general airway inspection was performed and there was no evidence of active bleeding. The bronchoscope was removed.  The patient tolerated the procedure well. There was no significant blood loss and there were no obvious complications. A post-procedural chest x-ray is pending.  Samples Target #1: 1. Transbronchial needle brushings from RUL solitary pulmonary nodule 2. Transbronchial Wang needle biopsies from RUL solitary pulmonary nodule 3. Transbronchial forceps biopsies from RUL solitary pulmonary nodule 4. Fiducial placed in RUL solitary pulmonary nodule.   Plans:  The patient will be discharged from the PACU to home when recovered from anesthesia and after chest x-ray is reviewed. We will review the cytology, pathology and microbiology results with the patient  when they become available. Outpatient followup will be with Lauraine Lites, NP.   Paula Southerly, MD  [place] [facility]

## 2024-01-01 NOTE — Anesthesia Procedure Notes (Signed)
 Procedure Name: Intubation Date/Time: 01/01/2024 12:01 PM  Performed by: Lockie Flesher, CRNAPre-anesthesia Checklist: Patient identified, Emergency Drugs available, Suction available and Patient being monitored Patient Re-evaluated:Patient Re-evaluated prior to induction Oxygen  Delivery Method: Circle System Utilized Preoxygenation: Pre-oxygenation with 100% oxygen  Induction Type: IV induction Ventilation: Mask ventilation without difficulty Laryngoscope Size: Mac and 3 Grade View: Grade I Tube type: Oral Tube size: 8.0 mm Number of attempts: 1 Airway Equipment and Method: Stylet and Oral airway Placement Confirmation: ETT inserted through vocal cords under direct vision, positive ETCO2 and breath sounds checked- equal and bilateral Secured at: 22 cm Tube secured with: Tape Dental Injury: Teeth and Oropharynx as per pre-operative assessment

## 2024-01-02 ENCOUNTER — Ambulatory Visit: Payer: Self-pay | Admitting: Pulmonary Disease

## 2024-01-02 ENCOUNTER — Encounter (HOSPITAL_COMMUNITY): Payer: Self-pay | Admitting: Pulmonary Disease

## 2024-01-02 DIAGNOSIS — C3490 Malignant neoplasm of unspecified part of unspecified bronchus or lung: Secondary | ICD-10-CM

## 2024-01-02 DIAGNOSIS — C3491 Malignant neoplasm of unspecified part of right bronchus or lung: Secondary | ICD-10-CM

## 2024-01-03 ENCOUNTER — Ambulatory Visit: Admitting: Internal Medicine

## 2024-01-03 ENCOUNTER — Other Ambulatory Visit: Payer: Self-pay

## 2024-01-03 DIAGNOSIS — M1612 Unilateral primary osteoarthritis, left hip: Secondary | ICD-10-CM

## 2024-01-03 LAB — CYTOLOGY - NON PAP

## 2024-01-03 MED ORDER — OXYCODONE-ACETAMINOPHEN 5-325 MG PO TABS: ORAL_TABLET | ORAL | 0 refills | Status: DC

## 2024-01-09 ENCOUNTER — Ambulatory Visit (HOSPITAL_COMMUNITY)
Admission: RE | Admit: 2024-01-09 | Discharge: 2024-01-09 | Disposition: A | Discharge status: Home / Self Care | Source: Ambulatory Visit | Attending: Pulmonary Disease | Admitting: Pulmonary Disease

## 2024-01-09 DIAGNOSIS — C3490 Malignant neoplasm of unspecified part of unspecified bronchus or lung: Secondary | ICD-10-CM | POA: Insufficient documentation

## 2024-01-09 DIAGNOSIS — C3491 Malignant neoplasm of unspecified part of right bronchus or lung: Secondary | ICD-10-CM | POA: Diagnosis present

## 2024-01-09 MED ORDER — GADOBUTROL 1 MMOL/ML IV SOLN
5.0000 mL | Freq: Once | INTRAVENOUS | Status: AC | PRN
  Administered 2024-01-09: 5 mL via INTRAVENOUS

## 2024-01-10 ENCOUNTER — Ambulatory Visit: Payer: Self-pay | Admitting: Pulmonary Disease

## 2024-01-13 NOTE — Progress Notes (Unsigned)
 [facility] [phone]   Fax:([phone]) [phone]  CONSULT NOTE  REFERRING PHYSICIAN: Dr. Catherine  REASON FOR CONSULTATION:  Non-small cell lung cancer adenocarcinoma  HPI Jessica [name] is a 83 y.o. female with a past medical history significant for PAD, hypertension, GI bleed, herpes, arthritis, vitamin D  deficiency opioid dependence?  Mg hyperlipidemia HIV and ***is referred to the clinic for **.  The patient establish care with pulmonary medicine on 12/21/2023 for a pulmonary nodule she had a CT of the chest performed ***nodule in the peripheral right upper lobe which has worsened to 10 mm.  Therefore she underwent bronchoscopy and biopsy on 10/21 by Dr. Catherine.   The final cytology ([id number]) showed non-small cell lung cancer, adenocarcinoma.  The patient completed the staging workup with a PET scan and brain MRI. The brain MRI was negative for metastatic disease.   The PET scan showed Hypermetabolic right upper lobe nodule as well as FDG avid ipsilateral hilar and mediastinal lymph nodes, metastatic. There was also a hypermetabolic left thyroid  nodule.     This patient was previously seen by Dr. Keren for iron  deficiency anemia due to chronic blood loss and vitamin B12 deficiency. ***The patient has required IV Iron  infusions before. ***Make sure taking B12 and iron   Overall, the patient is feeling *** today. Denies any fever, chills, night sweats, or weight loss. Denies any chest pain, shortness of breath, cough, or hemoptysis. Denies any nausea, vomiting, diarrhea, or constipation. Denies any headache or visual changes. Denies any rashes or skin changes.   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  Hearing aides with possible masking of tinnitus recommended but patient wished to defer secondary to finances.   Blood transfusion without reported diagnosis    pt denies   Bursitis of right shoulder 07/12/2012   s/p shoulder injection 07/12/2012    Cataract of right eye    REMOVED RIGHT EYE 4-19    Constipation due to pain medication 04/27/2010   Depression    Diverticulosis 02/08/2012   Extensive left-sided diverticula on colonoscopy March 2012 per Dr. Shaaron    Diverticulosis of colon without hemorrhage 02/08/2012   Extensive left-sided diverticula on colonoscopy March 2012 per Dr. 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   Headache 10/20/2019   Heart murmur 1961   History of small bowel obstruction 02/08/2012   s/p Exploratory laparotomy, lysis of adhesions 02/12/12     History of vitamin D  deficiency 05/29/2018   Vitamin D  18.96 (04/30/2018), treated with ergocalciferol  50,000 units PO QWk X 4 weeks     Human immunodeficiency virus disease (HCC) 03/27/1986   Hyperlipidemia LDL goal < 100 04/05/2012   Hypokalemia 04/12/2018   Insomnia 10/20/2019   Iron  deficiency anemia due to chronic blood loss 04/04/2023   Left hip pain 04/11/2021   Leg pain 04/11/2021   Long-term current use of opiate analgesic 03/17/2016   Loose stools 04/08/2019   Lumbar degenerative disc disease 07/20/2006   With chronic back pain    Marijuana use 07/03/2016   Micturition  syncope 09/20/2015   Nausea and vomiting 04/08/2019   Opiate dependence (HCC) 04/12/2018   Opioid dependence with current use (HCC) 04/12/2018   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   s/p aortobifem bypass 2009    Periumbilical hernia 05/18/2014   1 cm left periumbilical abdominal wall defect   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   DEXA 04/15/2012: L1-L4 spine T  -3.9, Right femur T -3.0    Right rotator cuff tear 02/01/2013   Responds to periodic steroid injections   Seborrhea 09/01/2010   Shoulder pain, right 12/04/2017   Small bowel obstruction due to adhesions (HCC) 02/08/2012   s/p Exploratory laparotomy, lysis of adhesions 02/12/12     Subjective tinnitus of both ears 05/18/2014   Tobacco abuse 02/19/2012   Tobacco abuse    Vasovagal syncope 02/15/2015   Visual hallucination 12/04/2022   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 Merrilyn; took out 12 inches of small intestiines and removed blockage   COLONOSCOPY  2012   COLONOSCOPY WITH PROPOFOL  N/A 12/29/2022   Procedure: COLONOSCOPY WITH PROPOFOL ;  Surgeon: Eartha Angelia Sieving, MD;  Location: [facility];  Service: Gastroenterology;  Laterality: N/A;   ESOPHAGOGASTRODUODENOSCOPY N/A 11/03/2023   Procedure: EGD (ESOPHAGOGASTRODUODENOSCOPY);  Surgeon: Eartha Angelia, Sieving, MD;  Location: [facility];  Service: Gastroenterology;  Laterality: N/A;   ESOPHAGOGASTRODUODENOSCOPY (EGD) WITH PROPOFOL  N/A 12/28/2022   Procedure: ESOPHAGOGASTRODUODENOSCOPY (EGD) WITH PROPOFOL ;  Surgeon: Cinderella Deatrice FALCON, MD;  Location: [facility];  Service: Endoscopy;  Laterality: N/A;   EYE SURGERY     EYE SURGERY  06/29/2020   06-29-2020- RIGHT CATARACT REMOVED AND LEFT EYE SURGERY TO REMOVE SAND LIKE SUBSTANCE    FEMORAL  ARTERY EXPLORATION Left 07/10/2020   Procedure: REDO LEFT FEMORAL ARTERY EXPOSURE;  Surgeon: Serene Gaile ORN, MD;  Location: [facility];  Service: Vascular;  Laterality: Left;   GIVENS CAPSULE STUDY N/A 12/30/2022   Procedure: GIVENS CAPSULE STUDY;  Surgeon: Eartha Angelia Sieving, MD;  Location: [facility];  Service: Gastroenterology;  Laterality: N/A;   LAPAROTOMY  02/12/2012   Procedure: EXPLORATORY LAPAROTOMY;  Surgeon: Jina Nephew, MD;  Location: [facility];  Service: General;  Laterality: N/A;  Exploratory Laparotomy, lysis of adhesions   SMALL INTESTINE SURGERY     THROMBECTOMY FEMORAL ARTERY Left 07/10/2020   Procedure: THROMBECTOMY AORTA-BIFEMORAL GRAFT, PROFUNDA, AND SUPERFICIAL FEMORAL ARTERY  LEFT LEG;  Surgeon: Serene Gaile ORN, MD;  Location: [facility];  Service: Vascular;  Laterality: Left;   VIDEO BRONCHOSCOPY WITH ENDOBRONCHIAL NAVIGATION Right 01/01/2024   Procedure: VIDEO BRONCHOSCOPY WITH ENDOBRONCHIAL NAVIGATION;  Surgeon: Catherine Cools, MD;  Location: [facility];  Service: Pulmonary;  Laterality: Right;   VIDEO BRONCHOSCOPY WITH ENDOBRONCHIAL ULTRASOUND  01/01/2024   Procedure: BRONCHOSCOPY, WITH EBUS;  Surgeon: Catherine Cools, MD;  Location: [facility];  Service: Pulmonary;;    Family History  Problem Relation Age of Onset   Kidney failure Mother    Diabetes Mother    Hypertension Mother    Heart disease Mother    Glaucoma Father    Colon polyps Father    Congestive Heart Failure Sister    Diabetes Sister    Kidney disease Sister    Diabetes Brother    Unexplained death Brother 43       Automobile accident   HIV/AIDS Brother    Hypothyroidism Daughter    Arthritis Daughter        Neck/Back   HIV Daughter    Kidney disease Daughter    Healthy Son    Arthritis Son        Knee   Colon cancer Neg Hx    Esophageal cancer Neg Hx    Rectal cancer Neg Hx    Stomach cancer Neg Hx     Social History Social History   Tobacco Use   Smoking status: Every Day     Current packs/day: 0.30    Average packs/day: 0.3 packs/day for 50.0 years (15.0 ttl pk-yrs)    Types: Cigarettes    Passive exposure: Current   Smokeless tobacco: Never   Tobacco comments:    5 cigs per day  Vaping Use   Vaping status: Never Used  Substance Use Topics   Alcohol use: No    Alcohol/week: 0.0 standard drinks of alcohol    Comment: last drink of alcohol ~ 1977   Drug use: Yes    Types: Marijuana    Comment: occassionally    Allergies  Allergen Reactions   Hctz [Hydrochlorothiazide ] Other (See Comments)    Dizziness, syncope; does NOT wish to take anymore    Current Outpatient Medications  Medication Sig Dispense Refill   acyclovir  (ZOVIRAX ) 400 MG tablet Take 1 tablet (400 mg total) by mouth 3 (three) times daily as needed. 21 tablet 0   albuterol  (VENTOLIN  HFA) 108 (90 Base) MCG/ACT inhaler Inhale 2 puffs into the lungs every 6 (six) hours as needed for wheezing or shortness of breath. 8.5 g 2   alendronate  (FOSAMAX ) 70 MG tablet Take 1 tablet (70 mg total) by mouth every Sunday. TAKE 1 TABLET BY MOUTH ONCE WEEKLY ON SUNDAY take with a full glass of water on an empty stomach Strength: 70 mg 12 tablet 3   ALPRAZolam  (XANAX ) 0.5 MG tablet Take 1 tablet (0.5 mg total) by mouth at bedtime as needed for sleep or anxiety. 30 tablet 3   amLODipine  (NORVASC ) 10 MG tablet Take 1 tablet (10 mg total) by mouth daily. For high BP 30 tablet 11   aspirin  EC 81 MG tablet Take 1 tablet (81 mg total) by mouth daily with breakfast. 90 tablet 3   atorvastatin  (LIPITOR) 10 MG tablet Take 1 tablet (10 mg total) by mouth at bedtime. 90 tablet 3   benazepril  (LOTENSIN ) 40 MG tablet Take 40 mg by mouth daily.     bictegravir-emtricitabine -tenofovir  AF (BIKTARVY ) 50-200-25 MG TABS tablet Take 1 tablet by mouth daily. 30 tablet 11   brimonidine  (ALPHAGAN ) 0.2 % ophthalmic solution 1 drop 3 (three) times daily.     clopidogrel  (PLAVIX ) 75 MG tablet Take 1 tablet (75 mg total) by mouth  daily. 90 tablet 3   cyanocobalamin  (VITAMIN B12) 1000 MCG tablet Take 1 tablet (1,000 mcg total) by mouth daily. (Patient not taking: Reported on 12/31/2023) 30 tablet 2   diclofenac  Sodium (VOLTAREN ) 1 % GEL Apply 2 g topically 4 (  four) times daily. APPLY TWO GRAMS TO AFFECTED AREA(S) FOUR TIMES DAILY Strength: 1 % 350 g 2   DIPHENHYDRAMINE  HCL, TOPICAL, (BENADRYL  ITCH STOPPING) 2 % GEL Apply 1 Application topically every 4 (four) hours as needed (symptoms of bugs on scalp). (Patient not taking: Reported on 12/31/2023) 57 g 3   dorzolamide -timolol  (COSOPT ) 2-0.5 % ophthalmic solution Place 1 drop into the right eye 2 (two) times daily.     ferrous sulfate  325 (65 FE) MG tablet Take 1 tablet (325 mg total) by mouth daily with breakfast.     gabapentin  (NEURONTIN ) 300 MG capsule TAKE ONE CAPSULE BY MOUTH THREE TIMES DAILY (DOSE decrease) 270 capsule 3   latanoprost  (XALATAN ) 0.005 % ophthalmic solution 1 drop at bedtime.     mirtazapine  (REMERON ) 7.5 MG tablet Take 1 tablet (7.5 mg total) by mouth at bedtime. 30 tablet 3   nicotine  (NICODERM CQ  - DOSED IN MG/24 HOURS) 14 mg/24hr patch RX #1 Weeks 1-6: 14 mg x 1 patch daily. Wear for 24 hours. If you have sleep disturbances, remove at bedtime. 42 patch 0   nicotine  (NICODERM CQ  - DOSED IN MG/24 HR) 7 mg/24hr patch RX #2 Weeks 7-8: 7 mg x 1 patch dailyWear for 24 hours. If you have sleep disturbances, remove at bedtime.. 14 patch 0   oxyCODONE -acetaminophen  (PERCOCET/ROXICET) 5-325 MG tablet Take 1 tablet only for severe pain; as often as every 8 hours.  Use sparingly due to risk of falls. 60 tablet 0   polyethylene glycol (MIRALAX ) 17 g packet Take 17 g by mouth daily. 90 packet 2   senna (SENOKOT) 8.6 MG TABS tablet Take 1 tablet (8.6 mg total) by mouth daily. (Patient not taking: Reported on 12/31/2023) 120 tablet 0   thiamine  (VITAMIN B1) 100 MG tablet Take 1 tablet (100 mg total) by mouth daily. 90 tablet 3   No current facility-administered  medications for this visit.    REVIEW OF SYSTEMS:   Review of Systems  Constitutional: Negative for appetite change, chills, fatigue, fever and unexpected weight change.  HENT:   Negative for mouth sores, nosebleeds, sore throat and trouble swallowing.   Eyes: Negative for eye problems and icterus.  Respiratory: Negative for cough, hemoptysis, shortness of breath and wheezing.   Cardiovascular: Negative for chest pain and leg swelling.  Gastrointestinal: Negative for abdominal pain, constipation, diarrhea, nausea and vomiting.  Genitourinary: Negative for bladder incontinence, difficulty urinating, dysuria, frequency and hematuria.   Musculoskeletal: Negative for back pain, gait problem, neck pain and neck stiffness.  Skin: Negative for itching and rash.  Neurological: Negative for dizziness, extremity weakness, gait problem, headaches, light-headedness and seizures.  Hematological: Negative for adenopathy. Does not bruise/bleed easily.  Psychiatric/Behavioral: Negative for confusion, depression and sleep disturbance. The patient is not nervous/anxious.     PHYSICAL EXAMINATION:  There were no vitals taken for this visit.  ECOG PERFORMANCE STATUS: {CHL ONC ECOG [id number]  Physical Exam  Constitutional: Oriented to person, place, and time and well-developed, well-nourished, and in no distress. No distress.  HENT:  Head: Normocephalic and atraumatic.  Mouth/Throat: Oropharynx is clear and moist. No oropharyngeal exudate.  Eyes: Conjunctivae are normal. Right eye exhibits no discharge. Left eye exhibits no discharge. No scleral icterus.  Neck: Normal range of motion. Neck supple.  Cardiovascular: Normal rate, regular rhythm, normal heart sounds and intact distal pulses.   Pulmonary/Chest: Effort normal and breath sounds normal. No respiratory distress. No wheezes. No rales.  Abdominal: Soft. Bowel sounds are normal. Exhibits  no distension and no mass. There is no tenderness.   Musculoskeletal: Normal range of motion. Exhibits no edema.  Lymphadenopathy:    No cervical adenopathy.  Neurological: Alert and oriented to person, place, and time. Exhibits normal muscle tone. Gait normal. Coordination normal.  Skin: Skin is warm and dry. No rash noted. Not diaphoretic. No erythema. No pallor.  Psychiatric: Mood, memory and judgment normal.  Vitals reviewed.  LABORATORY DATA: Lab Results  Component Value Date   WBC 7.6 11/22/2023   HGB 13.9 01/01/2024   HCT 41.0 01/01/2024   MCV 101 (H) 11/22/2023   PLT 304 11/22/2023      Chemistry      Component Value Date/Time   NA 146 (H) 01/01/2024 1147   NA 142 12/30/2021 0946   K 3.2 (L) 01/01/2024 1147   CL 113 (H) 01/01/2024 1147   CO2 18 (L) 11/03/2023 1002   BUN 17 01/01/2024 1147   BUN 14 12/30/2021 0946   CREATININE 0.60 01/01/2024 1147   CREATININE 0.97 (H) 06/05/2023 0958      Component Value Date/Time   CALCIUM  8.3 (L) 11/03/2023 1002   ALKPHOS 53 11/02/2023 1726   AST 15 11/02/2023 1726   ALT 9 11/02/2023 1726   BILITOT 0.7 11/02/2023 1726   BILITOT 0.3 03/16/2017 1006       RADIOGRAPHIC STUDIES: MR BRAIN W WO CONTRAST Result Date: 01/09/2024 EXAM: MRI BRAIN WITH AND WITHOUT CONTRAST 01/09/2024 02:26:14 PM TECHNIQUE: Multiplanar multisequence MRI of the head/brain was performed with and without the administration of intravenous contrast. COMPARISON: MRI head 12/28/2022. CLINICAL HISTORY: Non-small cell lung cancer (NSCLC), staging. NSCLC of right lung (HCC), Malignant neoplasm of unspecified part of unspecified bronchus or lung. 5 mL of gadavist given. FINDINGS: BRAIN AND VENTRICLES: No acute infarct. 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 Cerebral volume within normal limits for patients age. No acute intracranial hemorrhage. No mass effect or midline shift. No hydrocephalus. The sella is unremarkable. Normal flow voids. No intracranial enhancement. No evidence of  intracranial metastatic disease. ORBITS: No acute abnormality. SINUSES: No acute abnormality. BONES AND SOFT TISSUES: Normal bone marrow signal and enhancement. Right lens replacement changes. No acute soft tissue abnormality. IMPRESSION: 1. No acute intracranial abnormality. 2. No evidence of intracranial metastatic disease. 3. 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 Electronically signed by: Donnice Mania MD 01/09/2024 06:20 PM EDT RP Workstation: [id number]   DG Chest Port 1 View Result Date: 01/01/2024 EXAM: 1 VIEW(S) XRAY OF THE CHEST 01/01/2024 01:17:35 PM COMPARISON: PET/CT 12/21/2023 and chest radiograph 09/27/2023. CLINICAL HISTORY: [id number] S/P bronchoscopy with biopsy [id number]. FINDINGS: LUNGS AND PLEURA: Redemonstrated nodular opacity in the right upper lung zone better evaluated on the recent prior PET CT. Otherwise, no acute focal consolidation. No pneumothorax. No pleural effusion. HEART AND MEDIASTINUM: No acute abnormality of the cardiac silhouette. Aortic atherosclerosis. BONES AND SOFT TISSUES: Diffuse osseous demineralization. No acute osseous abnormality. IMPRESSION: 1. No acute cardiopulmonary findings. 2. Redemonstrated nodule in the right upper lobe, better assessed on the recent prior PET/CT. Electronically signed by: Harrietta Sherry MD 01/01/2024 03:00 PM EDT RP Workstation: [id number]   DG C-ARM BRONCHOSCOPY Result Date: 01/01/2024 C-ARM BRONCHOSCOPY: Fluoroscopy was utilized by the requesting physician.  No radiographic interpretation.   NM PET Image Initial (PI) Skull Base To Thigh Result Date: 12/21/2023 CLINICAL DATA:  Initial treatment strategy for tumor type. EXAM: NUCLEAR MEDICINE PET SKULL BASE TO THIGH TECHNIQUE: 5.73 mCi F-18 FDG was injected intravenously. Full-ring PET imaging  was performed from the skull base to thigh after the radiotracer. CT data was obtained and used for attenuation correction and anatomic localization. Fasting blood  glucose: 85 mg/dl COMPARISON:  Chest CT same day and chest CT from October 23, 2023. FINDINGS: Mediastinal blood pool activity: SUV max 1.6 Liver activity: SUV max 2.8 NECK: Multinodular thyroid . A hypermetabolic 1 cm nodule with max SUV 3.8 is identified in left lobe. No hypermetabolic supraclavicular lymphadenopathy. Incidental CT findings: None. CHEST: Spiculated right upper lobe hypermetabolic 11 mm pulmonary nodule max SUV 3.8, previously measured 10 mm. There is suggestion of macroscopic fat on same day CT. Upper lobe predominant mild centrilobular emphysematous changes. Hypermetabolic right hilar and right lower paratracheal lymph nodes measuring up to 8 mm with max SUV up to 3. Patulous esophagus.  No pleural effusion. 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 Atherosclerotic calcifications of aorta and coronary arteries. Hiatal hernia. ABDOMEN/PELVIS: Diffuse uptake along the aortobifemoral stent grafts suggestive of inflammatory changes, max SUV up to 5.5. Surgical anastomosis of proximal jejunum without suspicious metabolic activity. There is mild dilation of the jejunum measuring up to 4.2 cm at, previously at the site of anastomosis similar to prior. Focal activity overlying the left labia likely urinary contamination max SUV 5.9. No suspicious findings to suggest distant metastasis. Incidental CT findings: Cholecystectomy. Hysterectomy. No adnexal mass. Colonic diverticulosis without diverticulitis. SKELETON: No suspicious osseous abnormality. Multilevel degenerative changes of the spine Incidental CT findings: None. IMPRESSION: Hypermetabolic right upper lobe nodule. Given the presence of fat attenuation on same day dedicated chest CT findings may represent a hamartoma, although uncommon increased activity may be seen on PET-CT of hamartomas. Therefore further investigation with PET-CT is confusing. Bronchogenic carcinoma can not be completely  excluded. Recommend tissue sampling. FDG avid ipsilateral hilar and mediastinal lymph nodes, metastatic versus reactive. Hypermetabolic left thyroid  lobe nodule increases the chance of primary malignancy. Recommend ultrasound for further assessment. Diffuse uptake along the aortobifemoral stent graft, likely inflammatory. Electronically Signed   By: Megan  Zare M.D.   On: 12/21/2023 14:20   NM PET SUPER D CT Result Date: 12/21/2023 CLINICAL DATA:  Lung nodule. * Tracking Code: BO * No significant vascular findings. Normal heart size. No pericardial effusion. Coronary artery calcification and aortic atherosclerotic calcification. EXAM: CT CHEST WITHOUT CONTRAST TECHNIQUE: Multidetector CT imaging of the chest was performed using thin slice collimation for electromagnetic bronchoscopy planning purposes, without intravenous contrast. RADIATION DOSE REDUCTION: This exam was performed according to the departmental dose-optimization program which includes automated exposure control, adjustment of the mA and/or kV according to patient size and/or use of iterative reconstruction technique. COMPARISON:  None Available. FINDINGS: Cardiovascular: No significant vascular findings. Normal heart size. No pericardial effusion. Coronary artery calcification and aortic atherosclerotic calcification. Mediastinum/Nodes: No axillary or supraclavicular adenopathy. No mediastinal or hilar adenopathy. No pericardial fluid. Esophagus normal. Lungs/Pleura: RIGHT upper lobe nodule measures 7 mm on image 25/series 2. Mild centrilobular emphysema the upper lobes. Upper Abdomen: Limited view of the liver, kidneys, pancreas are unremarkable. Normal adrenal glands. Musculoskeletal: No aggressive osseous lesion. IMPRESSION: 1. RIGHT upper lobe pulmonary nodule.  See same day PET  CT 2. Aortic Atherosclerosis (ICD10-I70.0) and Emphysema (ICD10-J43.9). Electronically Signed   By: Jackquline Boxer M.D.   On: 12/21/2023 13:06     ASSESSMENT: This a very pleasant 83 year old African-American with stage ***III*** (T***, N***, M0) non-small cell lung cancer, adenocarcinoma.  The patient presented with a hypermetabolic right upper lobe nodule  as well as FDG avid ipsilateral mediastinal lymph which could be metastatic versus reactive.  The patient was diagnosed in October 2025.   ***Molecular's  The patient also has a order a thyroid  ultrasound  The patient was seen by Dr. Sherrod today.  Dr. Sherrod had a lengthy discussion with the patient today about her current condition and treatment options.  Dr. Sherrod rec concurrent chemoradiation with weekly carboplatin for AUC of 2 the patient is interested in she is expected to undergo her first cycle of treatment in 2 weeks on 11/***/25.   The adverse side effects of treatment were discussed including but not limited to nausea, vomiting, kidney, liver dysfunction, mild infection, and peripheral neuropathy.  I have placed a referral to radiation oncology. Next prior to her first cycle of treatment. Next? Prescription for Compazine 10 mL p.o. every 6 hours as needed to the patient's pharmacy. Next we will see her back ***.     The patient voices understanding of current disease status and treatment options and is in agreement with the current care plan.  All questions were answered. The patient knows to call the clinic with any problems, questions or concerns. We can certainly see the patient much sooner if necessary.  Thank you so much for allowing me to participate in the care of Jessica [name]. I will continue to follow up the patient with you and assist in her care.  I spent {CHL ONC TIME VISIT - DTPQU:[id number]} counseling the patient face to face. The total time spent in the appointment was {CHL ONC TIME VISIT - DTPQU:[id number]}.  Disclaimer: This note was dictated with voice recognition software. Similar sounding words can inadvertently be transcribed  and may not be corrected upon review.   Jessica [name] January 13, 2024, 9:55 AM

## 2024-01-14 NOTE — Progress Notes (Unsigned)
 Established Patient Pulmonology Office Visit   Subjective:  Patient ID: Jessica [name], female    DOB: 06-07-1940  MRN: [id number]  CC: No chief complaint on file.   HPI  Jessica [name] is a 83 y.o. female with HTN, PAD on DAPT (plavix , and aspirin ), emphysema, nicotine  dependence with current use, blindness who presents for follow up.  She was last seen on 12/18/2023 for RUL solitary pulmonary nodule. 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 Referred to medical and radiation oncology. TB discussion recommended PFTs and then decide on SBRT vs. Surgery.   {PULM QUESTIONNAIRES (Optional):33196}  ROS  {History (Optional):23778}  Current Outpatient Medications:    acyclovir  (ZOVIRAX ) 400 MG tablet, Take 1 tablet (400 mg total) by mouth 3 (three) times daily as needed., Disp: 21 tablet, Rfl: 0   albuterol  (VENTOLIN  HFA) 108 (90 Base) MCG/ACT inhaler, Inhale 2 puffs into the lungs every 6 (six) hours as needed for wheezing or shortness of breath., Disp: 8.5 g, Rfl: 2   alendronate  (FOSAMAX ) 70 MG tablet, Take 1 tablet (70 mg total) by mouth every Sunday. TAKE 1 TABLET BY MOUTH ONCE WEEKLY ON SUNDAY take with a full glass of water on an empty stomach Strength: 70 mg, Disp: 12 tablet, Rfl: 3   ALPRAZolam  (XANAX ) 0.5 MG tablet, Take 1 tablet (0.5 mg total) by mouth at bedtime as needed for sleep or anxiety., Disp: 30 tablet, Rfl: 3   amLODipine  (NORVASC ) 10 MG tablet, Take 1 tablet (10 mg total) by mouth daily. For high BP, Disp: 30 tablet, Rfl: 11   aspirin  EC 81 MG tablet, Take 1 tablet (81 mg total) by mouth daily with breakfast., Disp: 90 tablet, Rfl: 3   atorvastatin  (LIPITOR) 10 MG tablet, Take 1 tablet (10 mg total) by mouth at bedtime., Disp: 90 tablet, Rfl: 3   benazepril  (LOTENSIN ) 40 MG tablet, Take 40 mg by mouth daily., Disp: , Rfl:    bictegravir-emtricitabine -tenofovir  AF (BIKTARVY ) 50-200-25 MG  TABS tablet, Take 1 tablet by mouth daily., Disp: 30 tablet, Rfl: 11   brimonidine  (ALPHAGAN ) 0.2 % ophthalmic solution, 1 drop 3 (three) times daily., Disp: , Rfl:    clopidogrel  (PLAVIX ) 75 MG tablet, Take 1 tablet (75 mg total) by mouth daily., Disp: 90 tablet, Rfl: 3   cyanocobalamin  (VITAMIN B12) 1000 MCG tablet, Take 1 tablet (1,000 mcg total) by mouth daily. (Patient not taking: Reported on 12/31/2023), Disp: 30 tablet, Rfl: 2   diclofenac  Sodium (VOLTAREN ) 1 % GEL, Apply 2 g topically 4 (four) times daily. APPLY TWO GRAMS TO AFFECTED AREA(S) FOUR TIMES DAILY Strength: 1 %, Disp: 350 g, Rfl: 2   DIPHENHYDRAMINE  HCL, TOPICAL, (BENADRYL  ITCH STOPPING) 2 % GEL, Apply 1 Application topically every 4 (four) hours as needed (symptoms of bugs on scalp). (Patient not taking: Reported on 12/31/2023), Disp: 57 g, Rfl: 3   dorzolamide -timolol  (COSOPT ) 2-0.5 % ophthalmic solution, Place 1 drop into the right eye 2 (two) times daily., Disp: , Rfl:    ferrous sulfate  325 (65 FE) MG tablet, Take 1 tablet (325 mg total) by mouth daily with breakfast., Disp: , Rfl:    gabapentin  (NEURONTIN ) 300 MG capsule, TAKE ONE CAPSULE BY MOUTH THREE TIMES DAILY (DOSE decrease), Disp: 270 capsule, Rfl: 3   latanoprost  (XALATAN ) 0.005 % ophthalmic solution, 1 drop at bedtime., Disp: , Rfl:    mirtazapine  (REMERON ) 7.5 MG tablet, Take 1 tablet (7.5 mg total) by mouth at bedtime.,  Disp: 30 tablet, Rfl: 3   nicotine  (NICODERM CQ  - DOSED IN MG/24 HOURS) 14 mg/24hr patch, RX #1 Weeks 1-6: 14 mg x 1 patch daily. Wear for 24 hours. If you have sleep disturbances, remove at bedtime., Disp: 42 patch, Rfl: 0   nicotine  (NICODERM CQ  - DOSED IN MG/24 HR) 7 mg/24hr patch, RX #2 Weeks 7-8: 7 mg x 1 patch dailyWear for 24 hours. If you have sleep disturbances, remove at bedtime.., Disp: 14 patch, Rfl: 0   oxyCODONE -acetaminophen  (PERCOCET/ROXICET) 5-325 MG tablet, Take 1 tablet only for severe pain; as often as every 8 hours.  Use sparingly  due to risk of falls., Disp: 60 tablet, Rfl: 0   polyethylene glycol (MIRALAX ) 17 g packet, Take 17 g by mouth daily., Disp: 90 packet, Rfl: 2   senna (SENOKOT) 8.6 MG TABS tablet, Take 1 tablet (8.6 mg total) by mouth daily. (Patient not taking: Reported on 12/31/2023), Disp: 120 tablet, Rfl: 0   thiamine  (VITAMIN B1) 100 MG tablet, Take 1 tablet (100 mg total) by mouth daily., Disp: 90 tablet, Rfl: 3      Objective:  There were no vitals taken for this visit. {Pulm Vitals (Optional):32837}  Physical Exam   Diagnostic Review:  {Labs (Optional):32838}  Pathology 01/01/24: FINAL MICROSCOPIC DIAGNOSIS:  A. LUNG, RUL, BRUSHING:  Non-small cell carcinoma   B. LUNG, RUL, FINE NEEDLE ASPIRATION:  Non-small cell carcinoma  See comment   LDCT 10/23/23: IMPRESSION: 1. Spiculated and enlarging posterior segment right upper lobe nodule. Lung-RADS 4X, highly suspicious. Additional imaging evaluation or consultation with Pulmonology or Thoracic Surgery recommended. These results will be called to the ordering clinician or representative by the Radiologist Assistant, and communication documented in the PACS or [employer]. 2. 4.0 cm ascending aortic aneurysm, unchanged. Recommend annual imaging followup by CTA or MRA. This recommendation follows 2010 ACCF/AHA/AATS/ACR/ASA/SCA/SCAI/SIR/STS/SVM Guidelines for the Diagnosis and Management of Patients with Thoracic Aortic Disease. Circulation. 2010; 121: Z733-z630. Aortic aneurysm NOS (ICD10-I71.9). 3. Aortic atherosclerosis (ICD10-I70.0). Coronary artery calcification. 4. Enlarged pulmonic trunk, indicative of pulmonary arterial hypertension. 5.  Emphysema (ICD10-J43.9).  PET/CT 12/21/23: IMPRESSION: Hypermetabolic right upper lobe nodule. Given the presence of fat attenuation on same day dedicated chest CT findings may represent a hamartoma, although uncommon increased activity may be seen on PET-CT of hamartomas. Therefore  further investigation with PET-CT is confusing. Bronchogenic carcinoma can not be completely excluded. Recommend tissue sampling.   FDG avid ipsilateral hilar and mediastinal lymph nodes, metastatic versus reactive.   Hypermetabolic left thyroid  lobe nodule increases the chance of primary malignancy. Recommend ultrasound for further assessment.   Diffuse uptake along the aortobifemoral stent graft, likely inflammatory.  MRI Brain 01/09/2024: IMPRESSION: 1. No acute intracranial abnormality. 2. No evidence of intracranial metastatic disease. 3. 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    Assessment & Plan:   Assessment & Plan Non-small cell cancer of right lung (HCC)   No orders of the defined types were placed in this encounter.     No follow-ups on file.   Latrisa Hellums, MD

## 2024-01-14 NOTE — Progress Notes (Signed)
 Location of tumor and Histology per Pathology Report:   Biopsy: ***  Past/Anticipated interventions by surgeon, if any: {t:21944} ***  Past/Anticipated interventions by medical oncology, if any: Chemotherapy ***    Pain issues, if any:  {:18581} {PAIN DESCRIPTION:21022940}  SAFETY ISSUES: Prior radiation? {:18581} Pacemaker/ICD? {:18581} Possible current pregnancy? no Is the patient on methotrexate? {:18581}  Current Complaints / other details:  ***     ***

## 2024-01-15 ENCOUNTER — Encounter (HOSPITAL_BASED_OUTPATIENT_CLINIC_OR_DEPARTMENT_OTHER): Payer: Self-pay | Admitting: Pulmonary Disease

## 2024-01-15 ENCOUNTER — Other Ambulatory Visit: Payer: Self-pay | Admitting: Physician Assistant

## 2024-01-15 ENCOUNTER — Ambulatory Visit (INDEPENDENT_AMBULATORY_CARE_PROVIDER_SITE_OTHER)

## 2024-01-15 ENCOUNTER — Ambulatory Visit (INDEPENDENT_AMBULATORY_CARE_PROVIDER_SITE_OTHER): Admitting: Pulmonary Disease

## 2024-01-15 VITALS — BP 113/65 | HR 53 | Ht 66.0 in | Wt 116.1 lb

## 2024-01-15 DIAGNOSIS — J432 Centrilobular emphysema: Secondary | ICD-10-CM

## 2024-01-15 DIAGNOSIS — C3491 Malignant neoplasm of unspecified part of right bronchus or lung: Secondary | ICD-10-CM | POA: Diagnosis not present

## 2024-01-15 DIAGNOSIS — C3490 Malignant neoplasm of unspecified part of unspecified bronchus or lung: Secondary | ICD-10-CM

## 2024-01-15 DIAGNOSIS — F17200 Nicotine dependence, unspecified, uncomplicated: Secondary | ICD-10-CM

## 2024-01-15 DIAGNOSIS — R911 Solitary pulmonary nodule: Secondary | ICD-10-CM | POA: Diagnosis not present

## 2024-01-15 DIAGNOSIS — D500 Iron deficiency anemia secondary to blood loss (chronic): Secondary | ICD-10-CM

## 2024-01-15 LAB — PULMONARY FUNCTION TEST
DL/VA % pred: 46 %
DL/VA: 1.87 ml/min/mmHg/L
DLCO cor % pred: 33 %
DLCO cor: 6.76 ml/min/mmHg
DLCO unc % pred: 33 %
DLCO unc: 6.76 ml/min/mmHg
FEF 25-75 Pre: 1.62 L/s
FEF2575-%Pred-Pre: 110 %
FEV1-%Pred-Pre: 80 %
FEV1-Pre: 1.69 L
FEV1FVC-%Pred-Pre: 110 %
FEV6-%Pred-Pre: 78 %
FEV6-Pre: 2.09 L
FEV6FVC-%Pred-Pre: 105 %
FVC-%Pred-Pre: 74 %
FVC-Pre: 2.09 L
Pre FEV1/FVC ratio: 81 %
Pre FEV6/FVC Ratio: 100 %
RV % pred: 84 %
RV: 2.11 L
TLC % pred: 79 %
TLC: 4.28 L

## 2024-01-15 NOTE — Patient Instructions (Signed)
 Full PFT w/o post bronchodilator performed today.

## 2024-01-15 NOTE — Patient Instructions (Signed)
  VISIT SUMMARY: You were seen today to evaluate your lung function following your diagnosis of stage 1 lung cancer. Your lung function tests showed mostly normal results, but your diffusion capacity is below the threshold for surgery. Radiation therapy has been recommended as the best treatment option.  YOUR PLAN: STAGE I RIGHT UPPER LOBE LUNG CANCER: You have stage 1 lung cancer in the right upper lobe of your lung, confirmed by a biopsy. -You will be referred to a radiation oncologist to plan and start your radiation therapy. -Discuss the radiation therapy schedule and potential side effects with the radiation oncologist.  IMPAIRED DIFFUSION CAPACITY OF LUNG: Your lung function tests showed that your diffusion capacity is below the level needed for surgery. -Radiation therapy is recommended as the best treatment option for your lung cancer.   Contains text generated by [employer].

## 2024-01-15 NOTE — Progress Notes (Signed)
 Full PFT w/o post bronchodilator performed today.

## 2024-01-15 NOTE — Progress Notes (Signed)
 Radiation Oncology         [phone]) [phone] ________________________________  Initial Outpatient Consultation  Name: Jessica [name] MRN: [id number]  Date: 01/16/2024  DOB: 16-Dec-1940  CC:Trudy Mliss Dragon, MD  Catherine Cools, MD   REFERRING PHYSICIAN: Catherine Cools, MD  DIAGNOSIS: There were no encounter diagnoses.  Non-small cell carcinoma of right lung   HISTORY OF PRESENT ILLNESS::Jessica [name] is a 83 y.o. female who is accompanied by ***. she is seen as a courtesy of Dr. Alghanim for an opinion concerning radiation therapy as part of management for her recently diagnosed lung cancer. Patient has a history of smoking for approximately 60 years, starting at the age of 85, and currently smokes about 10 cigarettes a day. Family history significant for lung cancer, with her brother having died from the disease.   The patient is followed by the lung cancer screening program. She underwent a CT chest a year ago showed a small nodule in the periphery of her right upper lobe measuring approximately 6-7 millimeters. On most recent CT chest performed on 10/23/23  which has since increased in size to currently measuring 10 millimeters and being highly suspicious for malignancy.   Subsequently, she was referred to Dr. Catherine on 12/18/23 to discuss further treatment plan. Upon discussion, they opted to proceed with a video bronchoscopy with endobronchial navigational which she underwent on 01/01/24. Surgical pathology of lung brushing and FNA indicating non-small cell carcinoma. Immunohistochemistry shows the carcinoma is positive with TTF-1 and negative for p40 and cytokeratin 5/6 consistent with adenocarcinoma.        PET scan performed on 12/21/23 showed a hypermetabolic right hilar and right lower paratracheal lymph nodes measuring up to 8 mm with max SUV up to 3. MRI brain performed on 01/09/24 showing no evidence of intracranial metastatic disease.   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  Hearing aides with possible masking of tinnitus recommended but patient wished to defer secondary to finances.   Blood transfusion without reported diagnosis    pt denies   Bursitis of right shoulder 07/12/2012   s/p shoulder injection 07/12/2012    Cataract of right eye    REMOVED RIGHT EYE 4-19    Constipation due to pain medication 04/27/2010   Depression    Diverticulosis 02/08/2012   Extensive left-sided diverticula on colonoscopy March 2012 per Dr. Shaaron    Diverticulosis of colon without hemorrhage 02/08/2012   Extensive left-sided diverticula on colonoscopy March 2012 per Dr. 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   Headache 10/20/2019   Heart murmur 1961   History of small bowel obstruction 02/08/2012   s/p Exploratory laparotomy, lysis of adhesions 02/12/12     History of vitamin D  deficiency 05/29/2018   Vitamin D  18.96 (04/30/2018), treated with ergocalciferol  50,000 units PO QWk X 4 weeks     Human immunodeficiency virus disease (HCC) 03/27/1986   Hyperlipidemia LDL goal < 100 04/05/2012   Hypokalemia 04/12/2018   Insomnia 10/20/2019   Iron  deficiency  anemia due to chronic blood loss 04/04/2023   Left hip pain 04/11/2021   Leg pain 04/11/2021   Long-term current use of opiate analgesic 03/17/2016   Loose stools 04/08/2019   Lumbar degenerative disc disease 07/20/2006   With chronic back pain    Marijuana use 07/03/2016   Micturition syncope 09/20/2015   Nausea and vomiting  04/08/2019   Opiate dependence (HCC) 04/12/2018   Opioid dependence with current use (HCC) 04/12/2018   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   s/p aortobifem bypass 2009    Periumbilical hernia 05/18/2014   1 cm left periumbilical abdominal wall defect   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   DEXA 04/15/2012: L1-L4 spine T -3.9, Right femur T -3.0    Right rotator cuff tear 02/01/2013   Responds to periodic steroid injections   Seborrhea 09/01/2010   Shoulder pain, right 12/04/2017   Small bowel obstruction due to adhesions (HCC) 02/08/2012   s/p Exploratory laparotomy, lysis of adhesions 02/12/12     Subjective tinnitus of both ears 05/18/2014   Tobacco abuse 02/19/2012   Tobacco abuse    Vasovagal syncope 02/15/2015   Visual hallucination 12/04/2022   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 Merrilyn; took out 12 inches of small intestiines and removed blockage   COLONOSCOPY  2012   COLONOSCOPY WITH PROPOFOL  N/A 12/29/2022   Procedure: COLONOSCOPY WITH PROPOFOL ;  Surgeon: Eartha Angelia Sieving, MD;  Location: [facility];  Service: Gastroenterology;  Laterality: N/A;   ESOPHAGOGASTRODUODENOSCOPY N/A 11/03/2023   Procedure: EGD (ESOPHAGOGASTRODUODENOSCOPY);  Surgeon: Eartha Angelia, Sieving, MD;  Location: [facility];  Service: Gastroenterology;  Laterality: N/A;    ESOPHAGOGASTRODUODENOSCOPY (EGD) WITH PROPOFOL  N/A 12/28/2022   Procedure: ESOPHAGOGASTRODUODENOSCOPY (EGD) WITH PROPOFOL ;  Surgeon: Cinderella Deatrice FALCON, MD;  Location: [facility];  Service: Endoscopy;  Laterality: N/A;   EYE SURGERY     EYE SURGERY  06/29/2020   06-29-2020- RIGHT CATARACT REMOVED AND LEFT EYE SURGERY TO REMOVE SAND LIKE SUBSTANCE    FEMORAL ARTERY EXPLORATION Left 07/10/2020   Procedure: REDO LEFT FEMORAL ARTERY EXPOSURE;  Surgeon: Serene Gaile ORN, MD;  Location: [facility];  Service: Vascular;  Laterality: Left;   GIVENS CAPSULE STUDY N/A 12/30/2022   Procedure: GIVENS CAPSULE STUDY;  Surgeon: Eartha Angelia Sieving, MD;  [facility]: [facility];  Service: Gastroenterology;  Laterality: N/A;   LAPAROTOMY  02/12/2012   Procedure: EXPLORATORY LAPAROTOMY;  Surgeon: Jina Nephew, MD;  Location: [facility];  Service: General;  Laterality: N/A;  Exploratory Laparotomy, lysis of adhesions   SMALL INTESTINE SURGERY     THROMBECTOMY FEMORAL ARTERY Left 07/10/2020   Procedure: THROMBECTOMY AORTA-BIFEMORAL GRAFT, PROFUNDA, AND SUPERFICIAL FEMORAL ARTERY  LEFT LEG;  Surgeon: Serene Gaile ORN, MD;  Location: [facility];  Service: Vascular;  Laterality: Left;   VIDEO BRONCHOSCOPY WITH ENDOBRONCHIAL NAVIGATION Right  01/01/2024   Procedure: VIDEO BRONCHOSCOPY WITH ENDOBRONCHIAL NAVIGATION;  Surgeon: Catherine Cools, MD;  Location: [facility];  Service: Pulmonary;  Laterality: Right;   VIDEO BRONCHOSCOPY WITH ENDOBRONCHIAL ULTRASOUND  01/01/2024   Procedure: BRONCHOSCOPY, WITH EBUS;  Surgeon: Catherine Cools, MD;  Location: [facility];  Service: Pulmonary;;    FAMILY HISTORY:  Family History  Problem Relation Age of Onset   Kidney failure Mother    Diabetes Mother    Hypertension Mother    Heart disease Mother    Glaucoma Father    Colon polyps Father    Congestive Heart Failure Sister    Diabetes Sister    Kidney disease Sister    Diabetes Brother    Unexplained death Brother 23        Automobile accident   HIV/AIDS Brother    Hypothyroidism Daughter    Arthritis Daughter        Neck/Back   HIV Daughter    Kidney disease Daughter    Healthy Son    Arthritis Son        Knee   Colon cancer Neg Hx    Esophageal cancer Neg Hx    Rectal cancer Neg Hx    Stomach cancer Neg Hx     SOCIAL HISTORY:  Social History   Tobacco Use   Smoking status: Every Day    Current packs/day: 0.30    Average packs/day: 0.3 packs/day for 50.0 years (15.0 ttl pk-yrs)    Types: Cigarettes    Passive exposure: Current   Smokeless tobacco: Never   Tobacco comments:    5 cigs per day  Vaping Use   Vaping status: Never Used  Substance Use Topics   Alcohol use: No    Alcohol/week: 0.0 standard drinks of alcohol    Comment: last drink of alcohol ~ 1977   Drug use: Yes    Types: Marijuana    Comment: occassionally    ALLERGIES:  Allergies  Allergen Reactions   Hctz [Hydrochlorothiazide ] Other (See Comments)    Dizziness, syncope; does NOT wish to take anymore    MEDICATIONS:  Current Outpatient Medications  Medication Sig Dispense Refill   acyclovir  (ZOVIRAX ) 400 MG tablet Take 1 tablet (400 mg total) by mouth 3 (three) times daily as needed. 21 tablet 0   albuterol  (VENTOLIN  HFA) 108 (90 Base) MCG/ACT inhaler Inhale 2 puffs into the lungs every 6 (six) hours as needed for wheezing or shortness of breath. 8.5 g 2   alendronate  (FOSAMAX ) 70 MG tablet Take 1 tablet (70 mg total) by mouth every Sunday. TAKE 1 TABLET BY MOUTH ONCE WEEKLY ON SUNDAY take with a full glass of water on an empty stomach Strength: 70 mg 12 tablet 3   ALPRAZolam  (XANAX ) 0.5 MG tablet Take 1 tablet (0.5 mg total) by mouth at bedtime as needed for sleep or anxiety. 30 tablet 3   amLODipine  (NORVASC ) 10 MG tablet Take 1 tablet (10 mg total) by mouth daily. For high BP 30 tablet 11   aspirin  EC 81 MG tablet Take 1 tablet (81 mg total) by mouth daily with breakfast. 90 tablet 3   atorvastatin  (LIPITOR) 10 MG  tablet Take 1 tablet (10 mg total) by mouth at bedtime. 90 tablet 3   benazepril  (LOTENSIN ) 40 MG tablet Take 40 mg by mouth daily.     bictegravir-emtricitabine -tenofovir  AF (BIKTARVY ) 50-200-25 MG TABS tablet Take 1 tablet by mouth daily. 30 tablet 11   brimonidine  (ALPHAGAN ) 0.2 % ophthalmic solution 1  drop 3 (three) times daily.     clopidogrel  (PLAVIX ) 75 MG tablet Take 1 tablet (75 mg total) by mouth daily. 90 tablet 3   cyanocobalamin  (VITAMIN B12) 1000 MCG tablet Take 1 tablet (1,000 mcg total) by mouth daily. (Patient not taking: Reported on 12/31/2023) 30 tablet 2   diclofenac  Sodium (VOLTAREN ) 1 % GEL Apply 2 g topically 4 (four) times daily. APPLY TWO GRAMS TO AFFECTED AREA(S) FOUR TIMES DAILY Strength: 1 % 350 g 2   DIPHENHYDRAMINE  HCL, TOPICAL, (BENADRYL  ITCH STOPPING) 2 % GEL Apply 1 Application topically every 4 (four) hours as needed (symptoms of bugs on scalp). (Patient not taking: Reported on 12/31/2023) 57 g 3   dorzolamide -timolol  (COSOPT ) 2-0.5 % ophthalmic solution Place 1 drop into the right eye 2 (two) times daily.     ferrous sulfate  325 (65 FE) MG tablet Take 1 tablet (325 mg total) by mouth daily with breakfast.     gabapentin  (NEURONTIN ) 300 MG capsule TAKE ONE CAPSULE BY MOUTH THREE TIMES DAILY (DOSE decrease) 270 capsule 3   latanoprost  (XALATAN ) 0.005 % ophthalmic solution 1 drop at bedtime.     mirtazapine  (REMERON ) 7.5 MG tablet Take 1 tablet (7.5 mg total) by mouth at bedtime. 30 tablet 3   nicotine  (NICODERM CQ  - DOSED IN MG/24 HOURS) 14 mg/24hr patch RX #1 Weeks 1-6: 14 mg x 1 patch daily. Wear for 24 hours. If you have sleep disturbances, remove at bedtime. 42 patch 0   nicotine  (NICODERM CQ  - DOSED IN MG/24 HR) 7 mg/24hr patch RX #2 Weeks 7-8: 7 mg x 1 patch dailyWear for 24 hours. If you have sleep disturbances, remove at bedtime.. 14 patch 0   oxyCODONE -acetaminophen  (PERCOCET/ROXICET) 5-325 MG tablet Take 1 tablet only for severe pain; as often as every 8  hours.  Use sparingly due to risk of falls. 60 tablet 0   polyethylene glycol (MIRALAX ) 17 g packet Take 17 g by mouth daily. 90 packet 2   senna (SENOKOT) 8.6 MG TABS tablet Take 1 tablet (8.6 mg total) by mouth daily. (Patient not taking: Reported on 12/31/2023) 120 tablet 0   thiamine  (VITAMIN B1) 100 MG tablet Take 1 tablet (100 mg total) by mouth daily. 90 tablet 3   No current facility-administered medications for this visit.    REVIEW OF SYSTEMS:  A 10+ POINT REVIEW OF SYSTEMS WAS OBTAINED including neurology, dermatology, psychiatry, cardiac, respiratory, lymph, extremities, GI, GU, musculoskeletal, constitutional, reproductive, HEENT. ***   PHYSICAL EXAM:  vitals were not taken for this visit.   General: Alert and oriented, in no acute distress HEENT: Head is normocephalic. Extraocular movements are intact. Oropharynx is clear. Neck: Neck is supple, no palpable cervical or supraclavicular lymphadenopathy. Heart: Regular in rate and rhythm with no murmurs, rubs, or gallops. Chest: Clear to auscultation bilaterally, with no rhonchi, wheezes, or rales. Abdomen: Soft, nontender, nondistended, with no rigidity or guarding. Extremities: No cyanosis or edema. Lymphatics: see Neck Exam Skin: No concerning lesions. Musculoskeletal: symmetric strength and muscle tone throughout. Neurologic: Cranial nerves II through XII are grossly intact. No obvious focalities. Speech is fluent. Coordination is intact. Psychiatric: Judgment and insight are intact. Affect is appropriate. ***  ECOG = ***  0 - Asymptomatic (Fully active, able to carry on all predisease activities without restriction)  1 - Symptomatic but completely ambulatory (Restricted in physically strenuous activity but ambulatory and able to carry out work of a light or sedentary nature. For example, light housework, office work)  2 -  Symptomatic, <50% in bed during the day (Ambulatory and capable of all self care but unable to carry  out any work activities. Up and about more than 50% of waking hours)  3 - Symptomatic, >50% in bed, but not bedbound (Capable of only limited self-care, confined to bed or chair 50% or more of waking hours)  4 - Bedbound (Completely disabled. Cannot carry on any self-care. Totally confined to bed or chair)  5 - Death   Raylene MM, Creech RH, Tormey DC, et al. [phone]). Toxicity and response criteria of the [facility]. Am. DOROTHA Bridges. Oncol. 5 (6): 649-55  LABORATORY DATA:  Lab Results  Component Value Date   WBC 7.6 11/22/2023   HGB 13.9 01/01/2024   HCT 41.0 01/01/2024   MCV 101 (H) 11/22/2023   PLT 304 11/22/2023   NEUTROABS 4.0 11/02/2023   Lab Results  Component Value Date   NA 146 (H) 01/01/2024   K 3.2 (L) 01/01/2024   CL 113 (H) 01/01/2024   CO2 18 (L) 11/03/2023   GLUCOSE 84 01/01/2024   BUN 17 01/01/2024   CREATININE 0.60 01/01/2024   CALCIUM  8.3 (L) 11/03/2023      RADIOGRAPHY: MR BRAIN W WO CONTRAST Result Date: 01/09/2024 EXAM: MRI BRAIN WITH AND WITHOUT CONTRAST 01/09/2024 02:26:14 PM TECHNIQUE: Multiplanar multisequence MRI of the head/brain was performed with and without the administration of intravenous contrast. COMPARISON: MRI head 12/28/2022. CLINICAL HISTORY: Non-small cell lung cancer (NSCLC), staging. NSCLC of right lung (HCC), Malignant neoplasm of unspecified part of unspecified bronchus or lung. 5 mL of gadavist given. FINDINGS: BRAIN AND VENTRICLES: No acute infarct. 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 Cerebral volume within normal limits for patients age. No acute intracranial hemorrhage. No mass effect or midline shift. No hydrocephalus. The sella is unremarkable. Normal flow voids. No intracranial enhancement. No evidence of intracranial metastatic disease. ORBITS: No acute abnormality. SINUSES: No acute abnormality. BONES AND SOFT TISSUES: Normal bone marrow signal and enhancement. Right lens replacement  changes. No acute soft tissue abnormality. IMPRESSION: 1. No acute intracranial abnormality. 2. No evidence of intracranial metastatic disease. 3. 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 Electronically signed by: Donnice Mania MD 01/09/2024 06:20 PM EDT RP Workstation: [id number]   DG Chest Port 1 View Result Date: 01/01/2024 EXAM: 1 VIEW(S) XRAY OF THE CHEST 01/01/2024 01:17:35 PM COMPARISON: PET/CT 12/21/2023 and chest radiograph 09/27/2023. CLINICAL HISTORY: [id number] S/P bronchoscopy with biopsy [id number]. FINDINGS: LUNGS AND PLEURA: Redemonstrated nodular opacity in the right upper lung zone better evaluated on the recent prior PET CT. Otherwise, no acute focal consolidation. No pneumothorax. No pleural effusion. HEART AND MEDIASTINUM: No acute abnormality of the cardiac silhouette. Aortic atherosclerosis. BONES AND SOFT TISSUES: Diffuse osseous demineralization. No acute osseous abnormality. IMPRESSION: 1. No acute cardiopulmonary findings. 2. Redemonstrated nodule in the right upper lobe, better assessed on the recent prior PET/CT. Electronically signed by: Harrietta Sherry MD 01/01/2024 03:00 PM EDT RP Workstation: [id number]   DG C-ARM BRONCHOSCOPY Result Date: 01/01/2024 C-ARM BRONCHOSCOPY: Fluoroscopy was utilized by the requesting physician.  No radiographic interpretation.   NM PET Image Initial (PI) Skull Base To Thigh Result Date: 12/21/2023 CLINICAL DATA:  Initial treatment strategy for tumor type. EXAM: NUCLEAR MEDICINE PET SKULL BASE TO THIGH TECHNIQUE: 5.73 mCi F-18 FDG was injected intravenously. Full-ring PET imaging was performed from the skull base to thigh after the radiotracer. CT data was obtained and used for attenuation correction and anatomic localization. Fasting blood  glucose: 85 mg/dl COMPARISON:  Chest CT same day and chest CT from October 23, 2023. FINDINGS: Mediastinal blood pool activity: SUV max 1.6 Liver activity: SUV max 2.8 NECK:  Multinodular thyroid . A hypermetabolic 1 cm nodule with max SUV 3.8 is identified in left lobe. No hypermetabolic supraclavicular lymphadenopathy. Incidental CT findings: None. CHEST: Spiculated right upper lobe hypermetabolic 11 mm pulmonary nodule max SUV 3.8, previously measured 10 mm. There is suggestion of macroscopic fat on same day CT. Upper lobe predominant mild centrilobular emphysematous changes. Hypermetabolic right hilar and right lower paratracheal lymph nodes measuring up to 8 mm with max SUV up to 3. Patulous esophagus.  No pleural effusion. 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 Atherosclerotic calcifications of aorta and coronary arteries. Hiatal hernia. ABDOMEN/PELVIS: Diffuse uptake along the aortobifemoral stent grafts suggestive of inflammatory changes, max SUV up to 5.5. Surgical anastomosis of proximal jejunum without suspicious metabolic activity. There is mild dilation of the jejunum measuring up to 4.2 cm at, previously at the site of anastomosis similar to prior. Focal activity overlying the left labia likely urinary contamination max SUV 5.9. No suspicious findings to suggest distant metastasis. Incidental CT findings: Cholecystectomy. Hysterectomy. No adnexal mass. Colonic diverticulosis without diverticulitis. SKELETON: No suspicious osseous abnormality. Multilevel degenerative changes of the spine Incidental CT findings: None. IMPRESSION: Hypermetabolic right upper lobe nodule. Given the presence of fat attenuation on same day dedicated chest CT findings may represent a hamartoma, although uncommon increased activity may be seen on PET-CT of hamartomas. Therefore further investigation with PET-CT is confusing. Bronchogenic carcinoma can not be completely excluded. Recommend tissue sampling. FDG avid ipsilateral hilar and mediastinal lymph nodes, metastatic versus reactive. Hypermetabolic left thyroid  lobe nodule increases the  chance of primary malignancy. Recommend ultrasound for further assessment. Diffuse uptake along the aortobifemoral stent graft, likely inflammatory. Electronically Signed   By: Megan  Zare M.D.   On: 12/21/2023 14:20   NM PET SUPER D CT Result Date: 12/21/2023 CLINICAL DATA:  Lung nodule. * Tracking Code: BO * No significant vascular findings. Normal heart size. No pericardial effusion. Coronary artery calcification and aortic atherosclerotic calcification. EXAM: CT CHEST WITHOUT CONTRAST TECHNIQUE: Multidetector CT imaging of the chest was performed using thin slice collimation for electromagnetic bronchoscopy planning purposes, without intravenous contrast. RADIATION DOSE REDUCTION: This exam was performed according to the departmental dose-optimization program which includes automated exposure control, adjustment of the mA and/or kV according to patient size and/or use of iterative reconstruction technique. COMPARISON:  None Available. FINDINGS: Cardiovascular: No significant vascular findings. Normal heart size. No pericardial effusion. Coronary artery calcification and aortic atherosclerotic calcification. Mediastinum/Nodes: No axillary or supraclavicular adenopathy. No mediastinal or hilar adenopathy. No pericardial fluid. Esophagus normal. Lungs/Pleura: RIGHT upper lobe nodule measures 7 mm on image 25/series 2. Mild centrilobular emphysema the upper lobes. Upper Abdomen: Limited view of the liver, kidneys, pancreas are unremarkable. Normal adrenal glands. Musculoskeletal: No aggressive osseous lesion. IMPRESSION: 1. RIGHT upper lobe pulmonary nodule.  See same day PET  CT 2. Aortic Atherosclerosis (ICD10-I70.0) and Emphysema (ICD10-J43.9). Electronically Signed   By: Jackquline Boxer M.D.   On: 12/21/2023 13:06      IMPRESSION: Non-small cell carcinoma of right lung   ***  Today, I talked to the patient and family about the findings and work-up thus far.  We discussed the natural history of ***  and general treatment, highlighting the role of radiotherapy in the management.  We discussed the available radiation techniques,  and focused on the details of logistics and delivery.  We reviewed the anticipated acute and late sequelae associated with radiation in this setting.  The patient was encouraged to ask questions that I answered to the best of my ability. *** A patient consent form was discussed and signed.  We retained a copy for our records.  The patient would like to proceed with radiation and will be scheduled for CT simulation.  PLAN: ***    *** minutes of total time was spent for this patient encounter, including preparation, face-to-face counseling with the patient and coordination of care, physical exam, and documentation of the encounter.   ------------------------------------------------  Lynwood CHARM Nasuti, PhD, MD  This document serves as a record of services personally performed by Lynwood Nasuti, MD. It was created on his behalf by Reymundo Cartwright, a trained medical scribe. The creation of this record is based on the scribe's personal observations and the provider's statements to them. This document has been checked and approved by the attending provider.

## 2024-01-16 ENCOUNTER — Ambulatory Visit
Admission: RE | Admit: 2024-01-16 | Discharge: 2024-01-16 | Disposition: A | Discharge status: Home / Self Care | Source: Ambulatory Visit | Attending: Radiation Oncology | Admitting: Radiation Oncology

## 2024-01-16 ENCOUNTER — Encounter: Payer: Self-pay | Admitting: Radiation Oncology

## 2024-01-16 ENCOUNTER — Inpatient Hospital Stay: Attending: Physician Assistant | Admitting: Physician Assistant

## 2024-01-16 ENCOUNTER — Inpatient Hospital Stay

## 2024-01-16 ENCOUNTER — Encounter: Payer: Self-pay | Admitting: Licensed Clinical Social Worker

## 2024-01-16 VITALS — BP 138/68 | HR 66 | Temp 97.4°F | Resp 18 | Ht 66.0 in | Wt 117.9 lb

## 2024-01-16 VITALS — BP 195/64 | HR 70 | Temp 97.2°F | Resp 18 | Wt 118.2 lb

## 2024-01-16 DIAGNOSIS — R911 Solitary pulmonary nodule: Secondary | ICD-10-CM

## 2024-01-16 DIAGNOSIS — Z830 Family history of human immunodeficiency virus [HIV] disease: Secondary | ICD-10-CM | POA: Insufficient documentation

## 2024-01-16 DIAGNOSIS — J432 Centrilobular emphysema: Secondary | ICD-10-CM | POA: Diagnosis not present

## 2024-01-16 DIAGNOSIS — Z79899 Other long term (current) drug therapy: Secondary | ICD-10-CM | POA: Diagnosis not present

## 2024-01-16 DIAGNOSIS — Z7902 Long term (current) use of antithrombotics/antiplatelets: Secondary | ICD-10-CM | POA: Insufficient documentation

## 2024-01-16 DIAGNOSIS — C3411 Malignant neoplasm of upper lobe, right bronchus or lung: Secondary | ICD-10-CM

## 2024-01-16 DIAGNOSIS — M810 Age-related osteoporosis without current pathological fracture: Secondary | ICD-10-CM | POA: Diagnosis not present

## 2024-01-16 DIAGNOSIS — F17210 Nicotine dependence, cigarettes, uncomplicated: Secondary | ICD-10-CM | POA: Insufficient documentation

## 2024-01-16 DIAGNOSIS — Z21 Asymptomatic human immunodeficiency virus [HIV] infection status: Secondary | ICD-10-CM | POA: Diagnosis not present

## 2024-01-16 DIAGNOSIS — Z86718 Personal history of other venous thrombosis and embolism: Secondary | ICD-10-CM | POA: Insufficient documentation

## 2024-01-16 DIAGNOSIS — M129 Arthropathy, unspecified: Secondary | ICD-10-CM | POA: Diagnosis not present

## 2024-01-16 DIAGNOSIS — E785 Hyperlipidemia, unspecified: Secondary | ICD-10-CM | POA: Insufficient documentation

## 2024-01-16 DIAGNOSIS — Z8349 Family history of other endocrine, nutritional and metabolic diseases: Secondary | ICD-10-CM | POA: Insufficient documentation

## 2024-01-16 DIAGNOSIS — Z8249 Family history of ischemic heart disease and other diseases of the circulatory system: Secondary | ICD-10-CM | POA: Insufficient documentation

## 2024-01-16 DIAGNOSIS — I739 Peripheral vascular disease, unspecified: Secondary | ICD-10-CM | POA: Insufficient documentation

## 2024-01-16 DIAGNOSIS — H409 Unspecified glaucoma: Secondary | ICD-10-CM | POA: Insufficient documentation

## 2024-01-16 DIAGNOSIS — F1290 Cannabis use, unspecified, uncomplicated: Secondary | ICD-10-CM | POA: Diagnosis not present

## 2024-01-16 DIAGNOSIS — Z8419 Family history of other disorders of kidney and ureter: Secondary | ICD-10-CM | POA: Insufficient documentation

## 2024-01-16 DIAGNOSIS — I10 Essential (primary) hypertension: Secondary | ICD-10-CM | POA: Insufficient documentation

## 2024-01-16 DIAGNOSIS — Z7982 Long term (current) use of aspirin: Secondary | ICD-10-CM | POA: Diagnosis not present

## 2024-01-16 DIAGNOSIS — I2510 Atherosclerotic heart disease of native coronary artery without angina pectoris: Secondary | ICD-10-CM | POA: Insufficient documentation

## 2024-01-16 DIAGNOSIS — I700 Atherosclerosis of aorta: Secondary | ICD-10-CM | POA: Insufficient documentation

## 2024-01-16 DIAGNOSIS — M1990 Unspecified osteoarthritis, unspecified site: Secondary | ICD-10-CM | POA: Diagnosis not present

## 2024-01-16 DIAGNOSIS — D500 Iron deficiency anemia secondary to blood loss (chronic): Secondary | ICD-10-CM | POA: Insufficient documentation

## 2024-01-16 DIAGNOSIS — E559 Vitamin D deficiency, unspecified: Secondary | ICD-10-CM | POA: Insufficient documentation

## 2024-01-16 DIAGNOSIS — H548 Legal blindness, as defined in USA: Secondary | ICD-10-CM | POA: Insufficient documentation

## 2024-01-16 DIAGNOSIS — Z90710 Acquired absence of both cervix and uterus: Secondary | ICD-10-CM | POA: Insufficient documentation

## 2024-01-16 DIAGNOSIS — Z79624 Long term (current) use of inhibitors of nucleotide synthesis: Secondary | ICD-10-CM | POA: Insufficient documentation

## 2024-01-16 DIAGNOSIS — Z7983 Long term (current) use of bisphosphonates: Secondary | ICD-10-CM | POA: Insufficient documentation

## 2024-01-16 DIAGNOSIS — Z801 Family history of malignant neoplasm of trachea, bronchus and lung: Secondary | ICD-10-CM | POA: Diagnosis not present

## 2024-01-16 DIAGNOSIS — E042 Nontoxic multinodular goiter: Secondary | ICD-10-CM | POA: Diagnosis not present

## 2024-01-16 DIAGNOSIS — F1120 Opioid dependence, uncomplicated: Secondary | ICD-10-CM | POA: Insufficient documentation

## 2024-01-16 DIAGNOSIS — B20 Human immunodeficiency virus [HIV] disease: Secondary | ICD-10-CM | POA: Diagnosis not present

## 2024-01-16 DIAGNOSIS — C3490 Malignant neoplasm of unspecified part of unspecified bronchus or lung: Secondary | ICD-10-CM

## 2024-01-16 DIAGNOSIS — K449 Diaphragmatic hernia without obstruction or gangrene: Secondary | ICD-10-CM | POA: Insufficient documentation

## 2024-01-16 DIAGNOSIS — K5730 Diverticulosis of large intestine without perforation or abscess without bleeding: Secondary | ICD-10-CM | POA: Insufficient documentation

## 2024-01-16 DIAGNOSIS — D509 Iron deficiency anemia, unspecified: Secondary | ICD-10-CM

## 2024-01-16 DIAGNOSIS — Z791 Long term (current) use of non-steroidal anti-inflammatories (NSAID): Secondary | ICD-10-CM | POA: Diagnosis not present

## 2024-01-16 DIAGNOSIS — Z8261 Family history of arthritis: Secondary | ICD-10-CM | POA: Insufficient documentation

## 2024-01-16 DIAGNOSIS — Z9049 Acquired absence of other specified parts of digestive tract: Secondary | ICD-10-CM | POA: Insufficient documentation

## 2024-01-16 DIAGNOSIS — Z83511 Family history of glaucoma: Secondary | ICD-10-CM | POA: Insufficient documentation

## 2024-01-16 DIAGNOSIS — Z833 Family history of diabetes mellitus: Secondary | ICD-10-CM | POA: Insufficient documentation

## 2024-01-16 DIAGNOSIS — Z83719 Family history of colon polyps, unspecified: Secondary | ICD-10-CM | POA: Insufficient documentation

## 2024-01-16 DIAGNOSIS — I7121 Aneurysm of the ascending aorta, without rupture: Secondary | ICD-10-CM | POA: Diagnosis not present

## 2024-01-16 LAB — CBC WITH DIFFERENTIAL (CANCER CENTER ONLY)
Abs Immature Granulocytes: 0.01 K/uL (ref 0.00–0.07)
Basophils Absolute: 0.1 K/uL (ref 0.0–0.1)
Basophils Relative: 1 %
Eosinophils Absolute: 0 K/uL (ref 0.0–0.5)
Eosinophils Relative: 1 %
HCT: 38.6 % (ref 36.0–46.0)
Hemoglobin: 12.7 g/dL (ref 12.0–15.0)
Immature Granulocytes: 0 %
Lymphocytes Relative: 53 %
Lymphs Abs: 3.6 K/uL (ref 0.7–4.0)
MCH: 32.2 pg (ref 26.0–34.0)
MCHC: 32.9 g/dL (ref 30.0–36.0)
MCV: 98 fL (ref 80.0–100.0)
Monocytes Absolute: 0.4 K/uL (ref 0.1–1.0)
Monocytes Relative: 6 %
Neutro Abs: 2.6 K/uL (ref 1.7–7.7)
Neutrophils Relative %: 39 %
Platelet Count: 292 K/uL (ref 150–400)
RBC: 3.94 MIL/uL (ref 3.87–5.11)
RDW: 13.2 % (ref 11.5–15.5)
WBC Count: 6.7 K/uL (ref 4.0–10.5)
nRBC: 0 % (ref 0.0–0.2)

## 2024-01-16 LAB — CMP (CANCER CENTER ONLY)
ALT: 6 U/L (ref 0–44)
AST: 15 U/L (ref 15–41)
Albumin: 4.6 g/dL (ref 3.5–5.0)
Alkaline Phosphatase: 62 U/L (ref 38–126)
Anion gap: 7 (ref 5–15)
BUN: 15 mg/dL (ref 8–23)
CO2: 26 mmol/L (ref 22–32)
Calcium: 9.9 mg/dL (ref 8.9–10.3)
Chloride: 110 mmol/L (ref 98–111)
Creatinine: 0.66 mg/dL (ref 0.44–1.00)
GFR, Estimated: >60 mL/min (ref >60)
Glucose, Bld: 90 mg/dL (ref 70–99)
Potassium: 3.4 mmol/L — ABNORMAL LOW (ref 3.5–5.1)
Sodium: 143 mmol/L (ref 135–145)
Total Bilirubin: 0.3 mg/dL (ref 0.0–1.2)
Total Protein: 8.1 g/dL (ref 6.5–8.1)

## 2024-01-16 NOTE — Patient Instructions (Signed)
-  We will allow Dr. Shannon to radiate the nodule in the lung.  -We will order a repeat CT scan of the chest in 4 months to monitor the lymph nodes.  -They will call you from the scanning department, but you can call them to to schedule. [phone] -We will see you about 1 week after the scan to review the results in the office.

## 2024-01-22 ENCOUNTER — Other Ambulatory Visit: Payer: Self-pay

## 2024-01-22 DIAGNOSIS — F4321 Adjustment disorder with depressed mood: Secondary | ICD-10-CM

## 2024-01-22 DIAGNOSIS — F5102 Adjustment insomnia: Secondary | ICD-10-CM

## 2024-01-22 DIAGNOSIS — R634 Abnormal weight loss: Secondary | ICD-10-CM

## 2024-01-22 MED ORDER — MIRTAZAPINE 7.5 MG PO TABS
7.5000 mg | ORAL_TABLET | Freq: Every day | ORAL | 3 refills | Status: AC
Start: 2024-01-22 — End: ?

## 2024-01-22 NOTE — Progress Notes (Signed)
 error

## 2024-01-22 NOTE — Patient Instructions (Signed)
 Thank you, Ms.Lauraine MALVA Donnell Elmer for allowing us  to provide your care today. Today we discussed your blood pressure.    Referrals: -  New medications: -  I have ordered the following labs for you:  Lab Orders  No laboratory test(s) ordered today     I will call if any are abnormal. All of your labs can be accessed through My Chart.   My Chart Access: [email or web address]  Please follow-up in:    We look forward to seeing you next time. Please call our clinic at [phone] if you have any questions or concerns. The best time to call is Monday-Friday from 9am-4pm, but there is someone available 24/7. If after hours or the weekend, call the main hospital number and ask for the Internal Medicine Resident On-Call. If you need medication refills, please notify your pharmacy one week in advance and they will send us  a request.   Thank you for letting us  take part in your care. Wishing you the best!  Brando Taves, DO 01/22/2024, 10:54 AM [facility]

## 2024-01-22 NOTE — Assessment & Plan Note (Addendum)
 BP today 163/68 (high stress context). Took medications this morning.  Added Amlodipine  10mg  in October 2025 to Benazepril  40mg  without concerns for side effects. Diastolic is too low for further addition of medication.  Monitor.

## 2024-01-23 ENCOUNTER — Ambulatory Visit (INDEPENDENT_AMBULATORY_CARE_PROVIDER_SITE_OTHER): Admitting: Internal Medicine

## 2024-01-23 ENCOUNTER — Encounter: Payer: Self-pay | Admitting: Internal Medicine

## 2024-01-23 VITALS — BP 163/68 | HR 57 | Temp 97.6°F | Ht 66.0 in | Wt 116.4 lb

## 2024-01-23 DIAGNOSIS — C3411 Malignant neoplasm of upper lobe, right bronchus or lung: Secondary | ICD-10-CM

## 2024-01-23 DIAGNOSIS — Z8249 Family history of ischemic heart disease and other diseases of the circulatory system: Secondary | ICD-10-CM

## 2024-01-23 DIAGNOSIS — Z862 Personal history of diseases of the blood and blood-forming organs and certain disorders involving the immune mechanism: Secondary | ICD-10-CM

## 2024-01-23 DIAGNOSIS — I10 Essential (primary) hypertension: Secondary | ICD-10-CM

## 2024-01-23 DIAGNOSIS — E041 Nontoxic single thyroid nodule: Secondary | ICD-10-CM | POA: Diagnosis not present

## 2024-01-23 DIAGNOSIS — F17210 Nicotine dependence, cigarettes, uncomplicated: Secondary | ICD-10-CM | POA: Diagnosis not present

## 2024-01-23 DIAGNOSIS — H5316 Psychophysical visual disturbances: Secondary | ICD-10-CM

## 2024-01-23 DIAGNOSIS — Z79899 Other long term (current) drug therapy: Secondary | ICD-10-CM

## 2024-01-23 DIAGNOSIS — M810 Age-related osteoporosis without current pathological fracture: Secondary | ICD-10-CM

## 2024-01-23 NOTE — Progress Notes (Unsigned)
 83 y.o. Jessica [name] is here for follow-up of her recent dx of lung cancer found on annual screening CT.   Since last visit patient has received diagnostic bronch with biopsy and consultations with pulmonologist and radiation oncologist; she has chosen to under go only XRT.   Her son Jessica [name], his girlfriend Jessica [name], and her sister's son (nephew Jessica [name]) have all moved in.  She has people around her, to cook and clean and support her.  Nephew is managing her medications.  She is so relieved to have the assistance. Holding her own. The cancer is asymptomatic.  Tomorrow she picks up her radiation schedule; treatments haven't yet begun.  No one has checked on whether she's had melena or BRBPR (she is blind) - she has not felt lightheaded. Recent constipation has been annoying - - LLQ discomfort after moving bowels for a couple of days. Hx of divertiuclitis, no systemc sxs.  Having hallucinations, they're really acting up.   Sees children lined up in her room, grinning. As always, they are not threatening and she recognizes them for what they are.  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/28/2022   Iron  deficiency anemia due to chronic blood loss 12/28/2022   GI bleed hospitalized Fall 2024, no source found 12/27/2022   Nocturia more than twice per night 11/15/2022   Osteoarthritis of left hip 06/03/2021   Legal blindness 04/07/2021   Insomnia 10/20/2019   Opioid  dependence with current use (HCC) 04/12/2018   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   Diastolic dysfunction 09/28/2017   Marijuana use 07/03/2016   Bilateral sensorineural hearing loss 06/11/2014   Subjective tinnitus of both ears 05/18/2014   Right rotator cuff tear 02/01/2013   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   Generalized anxiety disorder 04/05/2012   Mixed hyperlipidemia 04/05/2012   Smoker 02/19/2012   PAD (peripheral artery disease) 11/01/2011   Constipation due to pain medication 04/27/2010   Genital herpes 07/20/2006   Systolic hypertension with white-coat component 07/20/2006   Lumbar degenerative disc disease 07/20/2006   Human immunodeficiency virus disease (HCC) 03/27/1986    Current Outpatient Medications:    acyclovir  (ZOVIRAX ) 400 MG tablet, Take 1 tablet (400 mg total) by mouth 3 (three) times daily as needed., Disp: 21 tablet, Rfl: 0   albuterol  (VENTOLIN  HFA) 108 (90 Base) MCG/ACT inhaler, Inhale 2 puffs into the lungs every 6 (six) hours as needed for wheezing or shortness of breath., Disp: 8.5 g, Rfl: 2   alendronate  (FOSAMAX ) 70 MG tablet, Take 1 tablet (70 mg total) by mouth every Sunday. TAKE 1 TABLET BY MOUTH ONCE WEEKLY ON SUNDAY take with a full glass of water on an empty stomach Strength: 70 mg, Disp: 12 tablet, Rfl: 3   ALPRAZolam  (XANAX ) 0.5 MG tablet, Take 1 tablet (0.5 mg total) by mouth at bedtime as needed for sleep or anxiety., Disp: 30 tablet, Rfl: 3   amLODipine  (NORVASC )  10 MG tablet, Take 1 tablet (10 mg total) by mouth daily. For high BP, Disp: 30 tablet, Rfl: 11   aspirin  EC 81 MG tablet, Take 1 tablet (81 mg total) by mouth daily with breakfast., Disp: 90 tablet, Rfl: 3   atorvastatin  (LIPITOR) 10 MG tablet, Take 1 tablet (10 mg total) by mouth at bedtime., Disp: 90 tablet, Rfl: 3   benazepril  (LOTENSIN ) 40 MG tablet, Take 40 mg by mouth daily., Disp: , Rfl:    bictegravir-emtricitabine -tenofovir  AF  (BIKTARVY ) 50-200-25 MG TABS tablet, Take 1 tablet by mouth daily., Disp: 30 tablet, Rfl: 11   brimonidine  (ALPHAGAN ) 0.2 % ophthalmic solution, 1 drop 3 (three) times daily., Disp: , Rfl:    clopidogrel  (PLAVIX ) 75 MG tablet, Take 1 tablet (75 mg total) by mouth daily., Disp: 90 tablet, Rfl: 3   cyanocobalamin  (VITAMIN B12) 1000 MCG tablet, Take 1 tablet (1,000 mcg total) by mouth daily. (Patient not taking: Reported on 01/15/2024), Disp: 30 tablet, Rfl: 2   diclofenac  Sodium (VOLTAREN ) 1 % GEL, Apply 2 g topically 4 (four) times daily. APPLY TWO GRAMS TO AFFECTED AREA(S) FOUR TIMES DAILY Strength: 1 %, Disp: 350 g, Rfl: 2   DIPHENHYDRAMINE  HCL, TOPICAL, (BENADRYL  ITCH STOPPING) 2 % GEL, Apply 1 Application topically every 4 (four) hours as needed (symptoms of bugs on scalp). (Patient not taking: Reported on 01/15/2024), Disp: 57 g, Rfl: 3   dorzolamide -timolol  (COSOPT ) 2-0.5 % ophthalmic solution, Place 1 drop into the right eye 2 (two) times daily., Disp: , Rfl:    ferrous sulfate  325 (65 FE) MG tablet, Take 1 tablet (325 mg total) by mouth daily with breakfast., Disp: , Rfl:    gabapentin  (NEURONTIN ) 300 MG capsule, TAKE ONE CAPSULE BY MOUTH THREE TIMES DAILY (DOSE decrease), Disp: 270 capsule, Rfl: 3   latanoprost  (XALATAN ) 0.005 % ophthalmic solution, 1 drop at bedtime., Disp: , Rfl:    mirtazapine  (REMERON ) 7.5 MG tablet, Take 1 tablet (7.5 mg total) by mouth at bedtime., Disp: 30 tablet, Rfl: 3   nicotine  (NICODERM CQ  - DOSED IN MG/24 HOURS) 14 mg/24hr patch, RX #1 Weeks 1-6: 14 mg x 1 patch daily. Wear for 24 hours. If you have sleep disturbances, remove at bedtime., Disp: 42 patch, Rfl: 0   nicotine  (NICODERM CQ  - DOSED IN MG/24 HR) 7 mg/24hr patch, RX #2 Weeks 7-8: 7 mg x 1 patch dailyWear for 24 hours. If you have sleep disturbances, remove at bedtime.., Disp: 14 patch, Rfl: 0   oxyCODONE -acetaminophen  (PERCOCET/ROXICET) 5-325 MG tablet, Take 1 tablet only for severe pain; as often as every 8  hours.  Use sparingly due to risk of falls., Disp: 60 tablet, Rfl: 0   polyethylene glycol (MIRALAX ) 17 g packet, Take 17 g by mouth daily., Disp: 90 packet, Rfl: 2   senna (SENOKOT) 8.6 MG TABS tablet, Take 1 tablet (8.6 mg total) by mouth daily. (Patient not taking: Reported on 01/15/2024), Disp: 120 tablet, Rfl: 0   thiamine  (VITAMIN B1) 100 MG tablet, Take 1 tablet (100 mg total) by mouth daily., Disp: 90 tablet, Rfl: 3  Functional Status: Has a bedside portable toilet in her room. Using her vision cane. Family have now moved in and she has wonderful 24/7 assistance. She feels very supported.  Objective BP (!) 163/68 (BP Location: Left Arm, Cuff Size: Large)   Pulse (!) 57   Temp 97.6 F (36.4 C) (Oral)   Ht 5' 6 (1.676 m)   Wt 116 lb 6.4 oz (52.8 kg)  SpO2 90%   BMI 18.79 kg/m  Exam: Alert, calm and appropriate in conversation, in no distress.  Underweight habitus. Breathing easily. No palpable thyroid  abnormality. Skin turgor reduced.    Assessment and Plan: Essential hypertension Assessment & Plan: BP today 163/68 (high stress context). Took medications this morning.  Added Amlodipine  10mg  in October 2025 to Benazepril  40mg  without concerns for side effects. Diastolic is too low for further addition of medication.  Monitor.    Thyroid  nodule Assessment & Plan:  Incidental finding on PET of a hypermetabolic thyroid  nodule. Discussed with her the low likelihood that this represents metastasis from lung cancer, but that it was important that we investigate further. Next step thyroid  ultrasound and thyroid  studies. 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 Lung-RADS 4X, highly suspicious.  Discussed the implications with her and her son.  She desired referral to pulmonology, and treatment planning  (she has declined surgery) for XRT is underway.  She is asymptomatic. We'll continue to support her.  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  Postmenopausal osteoporosis Assessment & Plan: Did not discuss today. She is completing her 3rd  year on alendronate . Will need to discuss timing of drug holiday at a future visit.    History of iron  deficiency anemia Assessment & Plan: Anemia resolved as of CBC earlier this month. Continue to monitor.         No follow-ups on file.

## 2024-01-24 ENCOUNTER — Ambulatory Visit
Admission: RE | Admit: 2024-01-24 | Discharge: 2024-01-24 | Disposition: A | Discharge status: Home / Self Care | Source: Ambulatory Visit | Attending: Radiation Oncology | Admitting: Radiation Oncology

## 2024-01-24 DIAGNOSIS — C3411 Malignant neoplasm of upper lobe, right bronchus or lung: Secondary | ICD-10-CM | POA: Insufficient documentation

## 2024-01-29 ENCOUNTER — Other Ambulatory Visit (HOSPITAL_COMMUNITY): Payer: Self-pay

## 2024-01-29 ENCOUNTER — Encounter: Payer: Self-pay | Admitting: Internal Medicine

## 2024-01-29 ENCOUNTER — Telehealth: Payer: Self-pay

## 2024-01-29 DIAGNOSIS — C3411 Malignant neoplasm of upper lobe, right bronchus or lung: Secondary | ICD-10-CM | POA: Diagnosis not present

## 2024-01-29 DIAGNOSIS — E041 Nontoxic single thyroid nodule: Secondary | ICD-10-CM | POA: Insufficient documentation

## 2024-01-29 NOTE — Assessment & Plan Note (Addendum)
 Incidental finding on PET of a hypermetabolic thyroid  nodule. Discussed with her the low likelihood that this represents metastasis from lung cancer, but that it was important that we investigate further. Next step thyroid  ultrasound.

## 2024-01-29 NOTE — Telephone Encounter (Signed)
 Prior Authorization for patient (Alendronate  Sodium 70MG  tablets) came through on cover my meds was submitted awaiting approval or denial.  KEY:[id number]

## 2024-01-29 NOTE — Telephone Encounter (Addendum)
 [facility] DONNAMARIA (KeyBETHA KNIGHT) PA Case ID #: [id number] Rx #: [id number] Need Help? Call us  at [phone] Outcome Additional Information Required This medication or product is on your plan's list of covered drugs. Prior authorization is not required at this time. If your pharmacy has questions regarding the processing of your prescription, please have them call the [facility] pharmacy help desk at [phone]. **Please note: This request was submitted electronically. Formulary lowering, tiering exception, cost reduction and/or pre-benefit determination review (including prospective Medicare hospice reviews) requests cannot be requested using this method of submission. Providers contact us  at [phone] for further assistance. Drug Alendronate  Sodium 70MG  tablets ePA cloud logo Form [facility] Medicare Part D Electronic Prior Authorization Form [id number] NCPDP) Original Claim Info [phone] Provide Exception Process Printed NoticeMaximum Daily Dose of [phone]

## 2024-01-29 NOTE — Assessment & Plan Note (Signed)
 Identified on recent screening CT  Spiculated and enlarging posterior segment right upper lobe nodule. Lung-RADS 4X, highly suspicious.  Discussed the implications with her and her son.  She desired referral to pulmonology, and treatment planning (she has declined surgery) for XRT is underway.  She is asymptomatic. We'll continue to support her.

## 2024-01-30 ENCOUNTER — Encounter: Payer: Self-pay | Admitting: Internal Medicine

## 2024-01-30 NOTE — Assessment & Plan Note (Signed)
 Stable and no-threatening, not interfering with QOL.

## 2024-01-30 NOTE — Assessment & Plan Note (Signed)
 Anemia resolved as of CBC earlier this month. Continue to monitor.

## 2024-01-30 NOTE — Assessment & Plan Note (Signed)
 Did not discuss today. She is completing her 3rd  year on alendronate . Will need to discuss timing of drug holiday at a future visit.

## 2024-01-31 ENCOUNTER — Ambulatory Visit (HOSPITAL_COMMUNITY)
Admission: RE | Admit: 2024-01-31 | Discharge: 2024-01-31 | Disposition: A | Discharge status: Home / Self Care | Source: Ambulatory Visit | Attending: Internal Medicine | Admitting: Internal Medicine

## 2024-01-31 ENCOUNTER — Ambulatory Visit (HOSPITAL_COMMUNITY)

## 2024-01-31 ENCOUNTER — Other Ambulatory Visit: Payer: Self-pay

## 2024-01-31 DIAGNOSIS — M1612 Unilateral primary osteoarthritis, left hip: Secondary | ICD-10-CM

## 2024-01-31 DIAGNOSIS — E041 Nontoxic single thyroid nodule: Secondary | ICD-10-CM | POA: Insufficient documentation

## 2024-01-31 DIAGNOSIS — F5102 Adjustment insomnia: Secondary | ICD-10-CM

## 2024-01-31 DIAGNOSIS — F411 Generalized anxiety disorder: Secondary | ICD-10-CM

## 2024-02-04 ENCOUNTER — Other Ambulatory Visit: Payer: Self-pay | Admitting: Internal Medicine

## 2024-02-04 ENCOUNTER — Other Ambulatory Visit: Payer: Self-pay

## 2024-02-04 ENCOUNTER — Ambulatory Visit
Admission: RE | Admit: 2024-02-04 | Discharge: 2024-02-04 | Disposition: A | Discharge status: Home / Self Care | Source: Ambulatory Visit | Attending: Radiation Oncology | Admitting: Radiation Oncology

## 2024-02-04 DIAGNOSIS — C3411 Malignant neoplasm of upper lobe, right bronchus or lung: Secondary | ICD-10-CM

## 2024-02-04 DIAGNOSIS — E041 Nontoxic single thyroid nodule: Secondary | ICD-10-CM

## 2024-02-04 LAB — RAD ONC ARIA SESSION SUMMARY
Course Elapsed Days: 0
Plan Fractions Treated to Date: 1
Plan Prescribed Dose Per Fraction: 18 Gy
Plan Total Fractions Prescribed: 3
Plan Total Prescribed Dose: 54 Gy
Reference Point Dosage Given to Date: 18 Gy
Reference Point Session Dosage Given: 18 Gy
Session Number: 1

## 2024-02-05 ENCOUNTER — Ambulatory Visit: Admitting: Radiation Oncology

## 2024-02-06 ENCOUNTER — Other Ambulatory Visit: Payer: Self-pay

## 2024-02-06 ENCOUNTER — Ambulatory Visit
Admission: RE | Admit: 2024-02-06 | Discharge: 2024-02-06 | Disposition: A | Source: Ambulatory Visit | Attending: Radiation Oncology | Admitting: Radiation Oncology

## 2024-02-06 DIAGNOSIS — C3411 Malignant neoplasm of upper lobe, right bronchus or lung: Secondary | ICD-10-CM | POA: Diagnosis not present

## 2024-02-06 LAB — RAD ONC ARIA SESSION SUMMARY
Course Elapsed Days: 2
Plan Fractions Treated to Date: 2
Plan Prescribed Dose Per Fraction: 18 Gy
Plan Total Fractions Prescribed: 3
Plan Total Prescribed Dose: 54 Gy
Reference Point Dosage Given to Date: 36 Gy
Reference Point Session Dosage Given: 18 Gy
Session Number: 2

## 2024-02-06 MED ORDER — ACYCLOVIR 400 MG PO TABS: 400.0000 mg | ORAL_TABLET | Freq: Three times a day (TID) | ORAL | 0 refills | Status: DC | PRN

## 2024-02-06 MED ORDER — OXYCODONE-ACETAMINOPHEN 5-325 MG PO TABS: ORAL_TABLET | ORAL | 0 refills | Status: DC

## 2024-02-06 MED ORDER — ALPRAZOLAM 0.5 MG PO TABS: 0.5000 mg | ORAL_TABLET | Freq: Every evening | ORAL | 3 refills | Status: AC | PRN

## 2024-02-11 ENCOUNTER — Ambulatory Visit
Admission: RE | Admit: 2024-02-11 | Discharge: 2024-02-11 | Disposition: A | Discharge status: Home / Self Care | Source: Ambulatory Visit | Attending: Radiation Oncology | Admitting: Radiation Oncology

## 2024-02-11 ENCOUNTER — Other Ambulatory Visit: Payer: Self-pay

## 2024-02-11 ENCOUNTER — Ambulatory Visit

## 2024-02-11 DIAGNOSIS — C3411 Malignant neoplasm of upper lobe, right bronchus or lung: Secondary | ICD-10-CM | POA: Diagnosis present

## 2024-02-11 LAB — RAD ONC ARIA SESSION SUMMARY
Course Elapsed Days: 7
Plan Fractions Treated to Date: 3
Plan Prescribed Dose Per Fraction: 18 Gy
Plan Total Fractions Prescribed: 3
Plan Total Prescribed Dose: 54 Gy
Reference Point Dosage Given to Date: 54 Gy
Reference Point Session Dosage Given: 18 Gy
Session Number: 3

## 2024-02-12 ENCOUNTER — Ambulatory Visit: Admitting: Infectious Disease

## 2024-02-12 ENCOUNTER — Ambulatory Visit: Admitting: Radiation Oncology

## 2024-02-12 NOTE — Radiation Completion Notes (Addendum)
" °  Radiation Oncology         [phone] ________________________________  Name: Jessica [name] MRN: [id number]  Date of Service: 02/11/2024  DOB: 28-Mar-1940  End of Treatment Note   Diagnosis: Stage IA1 (cT1a, cN0, cM0) lung cancer, adenocarcinoma of the RUL Intent: Curative     ==========DELIVERED PLANS==========  First Treatment Date: 2024-02-04 Last Treatment Date: 2024-02-11   Plan Name: Lung_R Site: Lung, Right Technique: SBRT/SRT-IMRT Mode: Photon Dose Per Fraction: 18 Gy Prescribed Dose (Delivered / Prescribed): 54 Gy / 54 Gy Prescribed Fxs (Delivered / Prescribed): 3 / 3     ====================================   The patient tolerated radiation. She developed mild fatigue as well as increased mucus production, but denies any hemoptysis or breathing issues.   The patient will return in one month.     Ronita Due, PA-C "

## 2024-02-13 ENCOUNTER — Ambulatory Visit: Admitting: Radiation Oncology

## 2024-02-18 ENCOUNTER — Ambulatory Visit: Admitting: Radiation Oncology

## 2024-02-19 ENCOUNTER — Ambulatory Visit: Admitting: Radiation Oncology

## 2024-02-26 ENCOUNTER — Telehealth: Payer: Self-pay

## 2024-02-26 NOTE — Progress Notes (Unsigned)
 Subjective:  Chief complaint: follow-up for HIV disease on medications   Patient ID: Jessica [name], female    DOB: 1941/01/07, 83 y.o.   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  Hearing aides with possible masking of tinnitus recommended but patient wished to defer secondary to finances.   Blood transfusion without reported diagnosis    pt denies   Bursitis of right shoulder 07/12/2012   s/p shoulder injection 07/12/2012    Cataract of right eye    REMOVED RIGHT EYE 4-19    Constipation due to pain medication 04/27/2010   Depression    Diverticulosis 02/08/2012   Extensive left-sided diverticula on colonoscopy March 2012 per Dr. Shaaron    Diverticulosis of colon without hemorrhage 02/08/2012   Extensive left-sided diverticula on colonoscopy March 2012 per Dr. 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   Headache 10/20/2019   Heart murmur 1961   History of iron  deficiency anemia due to chronic blood loss 12/28/2022   History of small bowel obstruction 02/08/2012   s/p Exploratory laparotomy, lysis of adhesions 02/12/12     History of vitamin D  deficiency 05/29/2018   Vitamin D  18.96 (04/30/2018), treated with ergocalciferol  50,000 units PO QWk X 4 weeks     Human immunodeficiency virus disease (HCC) 03/27/1986   Hyperlipidemia LDL goal < 100 04/05/2012   Hypokalemia 04/12/2018   Insomnia 10/20/2019   Iron  deficiency anemia due to chronic blood loss 04/04/2023   Left hip pain 04/11/2021   Leg pain 04/11/2021   Long-term current use of opiate analgesic 03/17/2016    Loose stools 04/08/2019   Lumbar degenerative disc disease 07/20/2006   With chronic back pain    Marijuana use 07/03/2016   Micturition syncope 09/20/2015   Nausea and vomiting 04/08/2019   Opiate dependence (HCC) 04/12/2018   Opioid dependence with current use (HCC) 04/12/2018   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   s/p aortobifem bypass 2009    Periumbilical hernia 05/18/2014   1 cm left periumbilical abdominal wall defect   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   DEXA 04/15/2012: L1-L4 spine T -3.9, Right femur T -3.0    Right rotator cuff tear 02/01/2013   Responds to periodic steroid injections   Seborrhea 09/01/2010   Shoulder pain, right 12/04/2017   Small bowel obstruction due to adhesions (HCC) 02/08/2012   s/p Exploratory laparotomy, lysis of adhesions 02/12/12     Subjective tinnitus of both ears 05/18/2014   Systolic hypertension with white-coat component 07/20/2006   Thyroid  nodule 01/29/2024   Tobacco abuse 02/19/2012   Tobacco abuse    Vasovagal syncope 02/15/2015   Visual hallucination 12/04/2022   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 Merrilyn; took out 12 inches of small intestiines and removed blockage   COLONOSCOPY  2012   COLONOSCOPY WITH PROPOFOL  N/A 12/29/2022   Procedure: COLONOSCOPY WITH PROPOFOL ;  Surgeon: Eartha Angelia Sieving, MD;  [facility]: [facility];   Service: Gastroenterology;  Laterality: N/A;   ESOPHAGOGASTRODUODENOSCOPY N/A 11/03/2023   Procedure: EGD (ESOPHAGOGASTRODUODENOSCOPY);  Surgeon: Eartha Angelia, Sieving, MD;  [facility]: [facility];  Service: Gastroenterology;  Laterality: N/A;   ESOPHAGOGASTRODUODENOSCOPY (EGD) WITH PROPOFOL  N/A 12/28/2022   Procedure: ESOPHAGOGASTRODUODENOSCOPY (EGD) WITH PROPOFOL ;  Surgeon: Cinderella Deatrice FALCON, MD;  [facility]: [facility];  Service: Endoscopy;  Laterality: N/A;   EYE SURGERY     EYE SURGERY  06/29/2020   06-29-2020- RIGHT CATARACT REMOVED AND LEFT EYE SURGERY TO REMOVE SAND LIKE SUBSTANCE    FEMORAL ARTERY EXPLORATION Left 07/10/2020   Procedure: REDO LEFT FEMORAL ARTERY EXPOSURE;  Surgeon: Serene Gaile ORN, MD;  Location: [facility];  Service: Vascular;  Laterality: Left;   GIVENS CAPSULE STUDY N/A 12/30/2022   Procedure: GIVENS CAPSULE STUDY;  Surgeon: Eartha Angelia Sieving, MD;  [facility]: [facility];  Service: Gastroenterology;  Laterality: N/A;   LAPAROTOMY  02/12/2012   Procedure: EXPLORATORY LAPAROTOMY;  Surgeon: Jina Nephew, MD;  Location: [facility];  Service: General;  Laterality: N/A;  Exploratory Laparotomy, lysis of adhesions   SMALL INTESTINE SURGERY     THROMBECTOMY FEMORAL ARTERY Left 07/10/2020   Procedure: THROMBECTOMY AORTA-BIFEMORAL GRAFT, PROFUNDA, AND SUPERFICIAL FEMORAL ARTERY  LEFT LEG;  Surgeon: Serene Gaile ORN, MD;  Location: [facility];  Service: Vascular;  Laterality: Left;   VIDEO BRONCHOSCOPY WITH ENDOBRONCHIAL NAVIGATION Right 01/01/2024   Procedure: VIDEO BRONCHOSCOPY WITH ENDOBRONCHIAL NAVIGATION;  Surgeon: Catherine Cools, MD;  [facility];  Service: Pulmonary;  Laterality: Right;   VIDEO BRONCHOSCOPY WITH ENDOBRONCHIAL ULTRASOUND  01/01/2024   Procedure: BRONCHOSCOPY, WITH EBUS;  Surgeon: Catherine Cools, MD;  Location: [facility];  Service: Pulmonary;;    Family History  Problem Relation Age of Onset   Kidney failure Mother    Diabetes Mother     Hypertension Mother    Heart disease Mother    Glaucoma Father    Colon polyps Father    Congestive Heart Failure Sister    Diabetes Sister    Kidney disease Sister    Diabetes Brother    Unexplained death Brother 24       Automobile accident   HIV/AIDS Brother    Hypothyroidism Daughter    Arthritis Daughter        Neck/Back   HIV Daughter    Kidney disease Daughter    Healthy Son    Arthritis Son        Knee   Colon cancer Neg Hx    Esophageal cancer Neg Hx    Rectal cancer Neg Hx    Stomach cancer Neg Hx       Social History   Socioeconomic History   Marital status: Widowed    Spouse name: Not on file   Number of children: 4   Years of education: 2y college   Highest education level: Not on file  Occupational History   Occupation: retired    Comment: previously worked as a building surveyor  for [employer]   Tobacco Use   Smoking status: Every Day    Current packs/day: 0.30    Average packs/day: 0.3 packs/day for 50.0 years (15.0 ttl pk-yrs)    Types: Cigarettes    Passive exposure: Current   Smokeless tobacco: Never   Tobacco comments:    5 cigs per day  Vaping Use   Vaping status: Never Used  Substance and Sexual Activity   Alcohol use: No    Alcohol/week: 0.0 standard drinks of alcohol    Comment: last drink of alcohol ~ 1977   Drug use: Yes    Types: Marijuana    Comment: occassionally   Sexual activity: Never  Other Topics Concern   Not on file  Social History Narrative   Lives alone in [place], [place]   Social Drivers of Health   Tobacco Use: High Risk (01/30/2024)   Patient History    Smoking Tobacco Use: Every Day    Smokeless Tobacco Use: Never    Passive Exposure: Current  Financial Resource Strain: Low Risk (08/16/2022)   Overall Financial Resource Strain (CARDIA)    Difficulty of Paying Living Expenses: Not hard at all  Food Insecurity: No Food Insecurity (01/16/2024)   Epic    Worried About Programme Researcher, Broadcasting/film/video in the Last Year: Never true     Ran Out of Food in the Last Year: Never true  Recent Concern: Food Insecurity - Food Insecurity Present (01/16/2024)   Epic    Worried About Programme Researcher, Broadcasting/film/video in the Last Year: Sometimes true    Ran Out of Food in the Last Year: Sometimes true  Transportation Needs: No Transportation Needs (01/16/2024)   Epic    Lack of Transportation (Medical): No    Lack of Transportation (Non-Medical): No  Physical Activity: Insufficiently Active (08/16/2022)   Exercise Vital Sign    Days of Exercise per Week: 1 day    Minutes of Exercise per Session: 10 min  Stress: Stress Concern Present (08/16/2022)   [employer] of Occupational Health - Occupational Stress Questionnaire    Feeling of Stress : To some extent  Social Connections: Moderately Isolated (11/03/2023)   Social Connection and Isolation Panel    Frequency of Communication with Friends and Family: More than three times a week    Frequency of Social Gatherings with Friends and Family: More than three times a week    Attends Religious Services: More than 4 times per year    Active Member of [employer] or Organizations: No    Attends Banker Meetings: Never    Marital Status: Widowed  Depression (PHQ2-9): Low Risk (01/23/2024)   Depression (PHQ2-9)    PHQ-2 Score: 1  Recent Concern: Depression (PHQ2-9) - Medium Risk (12/26/2023)   Depression (PHQ2-9)    PHQ-2 Score: 7  Alcohol Screen: Low Risk (08/16/2022)   Alcohol Screen    Last Alcohol Screening Score (AUDIT): 0  Housing: Low Risk (01/16/2024)   Epic    Unable to Pay for Housing in the Last Year: No    Number of Times Moved in the Last Year: 0    Homeless in the Last Year: No  Utilities: Not At Risk (01/16/2024)   Epic    Threatened with loss of utilities: No  Health Literacy: Not on file    Allergies[1]  Current Medications[2]   Review of Systems     Objective:   Physical Exam        Assessment & Plan:       [  1]  Allergies Allergen Reactions   Hctz  [Hydrochlorothiazide ] Other (See Comments)    Dizziness, syncope; does NOT wish to take anymore  [2]  Current Outpatient Medications:    acyclovir  (ZOVIRAX ) 400 MG tablet, Take 1 tablet (400 mg total) by mouth 3 (three) times daily as needed., Disp: 21 tablet, Rfl: 0   albuterol  (VENTOLIN  HFA) 108 (90 Base) MCG/ACT inhaler, Inhale 2 puffs into the lungs every 6 (six) hours as needed for wheezing or shortness of breath., Disp: 8.5 g, Rfl: 2   alendronate  (FOSAMAX ) 70 MG tablet, Take 1 tablet (70 mg total) by mouth every Sunday. TAKE 1 TABLET BY MOUTH ONCE WEEKLY ON SUNDAY take with a full glass of water on an empty stomach Strength: 70 mg, Disp: 12 tablet, Rfl: 3   ALPRAZolam  (XANAX ) 0.5 MG tablet, Take 1 tablet (0.5 mg total) by mouth at bedtime as needed for sleep or anxiety., Disp: 30 tablet, Rfl: 3   amLODipine  (NORVASC ) 10 MG tablet, Take 1 tablet (10 mg total) by mouth daily. For high BP, Disp: 30 tablet, Rfl: 11   aspirin  EC 81 MG tablet, Take 1 tablet (81 mg total) by mouth daily with breakfast., Disp: 90 tablet, Rfl: 3   atorvastatin  (LIPITOR) 10 MG tablet, Take 1 tablet (10 mg total) by mouth at bedtime., Disp: 90 tablet, Rfl: 3   benazepril  (LOTENSIN ) 40 MG tablet, Take 40 mg by mouth daily., Disp: , Rfl:    bictegravir-emtricitabine -tenofovir  AF (BIKTARVY ) 50-200-25 MG TABS tablet, Take 1 tablet by mouth daily., Disp: 30 tablet, Rfl: 11   brimonidine  (ALPHAGAN ) 0.2 % ophthalmic solution, 1 drop 3 (three) times daily., Disp: , Rfl:    clopidogrel  (PLAVIX ) 75 MG tablet, Take 1 tablet (75 mg total) by mouth daily., Disp: 90 tablet, Rfl: 3   cyanocobalamin  (VITAMIN B12) 1000 MCG tablet, Take 1 tablet (1,000 mcg total) by mouth daily. (Patient not taking: Reported on 01/15/2024), Disp: 30 tablet, Rfl: 2   diclofenac  Sodium (VOLTAREN ) 1 % GEL, Apply 2 g topically 4 (four) times daily. APPLY TWO GRAMS TO AFFECTED AREA(S) FOUR TIMES DAILY Strength: 1 %, Disp: 350 g, Rfl: 2   DIPHENHYDRAMINE  HCL,  TOPICAL, (BENADRYL  ITCH STOPPING) 2 % GEL, Apply 1 Application topically every 4 (four) hours as needed (symptoms of bugs on scalp). (Patient not taking: Reported on 01/15/2024), Disp: 57 g, Rfl: 3   dorzolamide -timolol  (COSOPT ) 2-0.5 % ophthalmic solution, Place 1 drop into the right eye 2 (two) times daily., Disp: , Rfl:    ferrous sulfate  325 (65 FE) MG tablet, Take 1 tablet (325 mg total) by mouth daily with breakfast., Disp: , Rfl:    gabapentin  (NEURONTIN ) 300 MG capsule, TAKE ONE CAPSULE BY MOUTH THREE TIMES DAILY (DOSE decrease), Disp: 270 capsule, Rfl: 3   latanoprost  (XALATAN ) 0.005 % ophthalmic solution, 1 drop at bedtime., Disp: , Rfl:    mirtazapine  (REMERON ) 7.5 MG tablet, Take 1 tablet (7.5 mg total) by mouth at bedtime., Disp: 30 tablet, Rfl: 3   nicotine  (NICODERM CQ  - DOSED IN MG/24 HOURS) 14 mg/24hr patch, RX #1 Weeks 1-6: 14 mg x 1 patch daily. Wear for 24 hours. If you have sleep disturbances, remove at bedtime., Disp: 42 patch, Rfl: 0   nicotine  (NICODERM CQ  - DOSED IN MG/24 HR) 7 mg/24hr patch, RX #2 Weeks 7-8: 7 mg x 1 patch dailyWear for 24 hours. If you have sleep disturbances, remove at bedtime.., Disp: 14 patch, Rfl: 0   oxyCODONE -acetaminophen  (PERCOCET/ROXICET) 5-325 MG tablet,  Take 1 tablet only for severe pain; as often as every 8 hours.  Use sparingly due to risk of falls., Disp: 60 tablet, Rfl: 0   polyethylene glycol (MIRALAX ) 17 g packet, Take 17 g by mouth daily., Disp: 90 packet, Rfl: 2   senna (SENOKOT) 8.6 MG TABS tablet, Take 1 tablet (8.6 mg total) by mouth daily. (Patient not taking: Reported on 01/15/2024), Disp: 120 tablet, Rfl: 0   thiamine  (VITAMIN B1) 100 MG tablet, Take 1 tablet (100 mg total) by mouth daily., Disp: 90 tablet, Rfl: 3

## 2024-02-26 NOTE — Telephone Encounter (Signed)
 Prior Authorization for patient (Alendronate  Sodium 70MG  tablets) came through on cover my meds was submitted with last office notes awaiting approval or denial.  [id number]

## 2024-02-26 NOTE — Telephone Encounter (Signed)
 [facility] DONNAMARIA (KeyBETHA ROLLING) PA Case ID #: [id number] Rx #: [id number] Need Help? Call us  at [phone] Outcome N/A today by [employer] 2017 NCPDP This medication or product is on your plan's list of covered drugs. Prior authorization is not required at this time. If your pharmacy has questions regarding the processing of your prescription, please have them call the [employer] pharmacy help desk at [phone]. **Please note: This request was submitted electronically. Formulary lowering, tiering exception, cost reduction and/or pre-benefit determination review (including prospective Medicare hospice reviews) requests cannot be requested using this method of submission. Providers contact us  at [phone] for further assistance. Drug Alendronate  Sodium 70MG  tablets ePA cloud logo Form [employer] Medicare Part D Electronic Prior Authorization Form [phone] NCPDP) Original Claim Info [id number] Provide Exception Process Printed NoticeMaximum Daily Dose of 9.857141

## 2024-02-27 ENCOUNTER — Encounter: Payer: Self-pay | Admitting: Infectious Disease

## 2024-02-27 ENCOUNTER — Ambulatory Visit: Admitting: Infectious Disease

## 2024-02-27 ENCOUNTER — Other Ambulatory Visit: Payer: Self-pay

## 2024-02-27 VITALS — BP 146/72 | HR 56 | Temp 97.1°F | Resp 16 | Wt 114.0 lb

## 2024-02-27 DIAGNOSIS — F411 Generalized anxiety disorder: Secondary | ICD-10-CM

## 2024-02-27 DIAGNOSIS — Z79891 Long term (current) use of opiate analgesic: Secondary | ICD-10-CM

## 2024-02-27 DIAGNOSIS — C3411 Malignant neoplasm of upper lobe, right bronchus or lung: Secondary | ICD-10-CM

## 2024-02-27 DIAGNOSIS — H548 Legal blindness, as defined in USA: Secondary | ICD-10-CM

## 2024-02-27 DIAGNOSIS — Z23 Encounter for immunization: Secondary | ICD-10-CM | POA: Diagnosis not present

## 2024-02-27 DIAGNOSIS — E782 Mixed hyperlipidemia: Secondary | ICD-10-CM | POA: Diagnosis not present

## 2024-02-27 DIAGNOSIS — F4321 Adjustment disorder with depressed mood: Secondary | ICD-10-CM

## 2024-02-27 DIAGNOSIS — B20 Human immunodeficiency virus [HIV] disease: Secondary | ICD-10-CM | POA: Diagnosis not present

## 2024-02-27 DIAGNOSIS — Z79899 Other long term (current) drug therapy: Secondary | ICD-10-CM | POA: Diagnosis not present

## 2024-02-27 MED ORDER — BIKTARVY 50-200-25 MG PO TABS: 1.0000 | ORAL_TABLET | Freq: Every day | ORAL | 11 refills | Status: AC

## 2024-02-28 LAB — T-HELPER CELLS (CD4) COUNT (NOT AT ARMC)
CD4 % Helper T Cell: 46 % (ref 33–65)
CD4 T Cell Abs: 1215 /uL (ref 400–1790)

## 2024-02-29 LAB — HIV-1 RNA QUANT-NO REFLEX-BLD
HIV 1 RNA Quant: NOT DETECTED {copies}/mL
HIV-1 RNA Quant, Log: NOT DETECTED {Log_copies}/mL

## 2024-02-29 LAB — SYPHILIS: RPR W/REFLEX TO RPR TITER AND TREPONEMAL ANTIBODIES, TRADITIONAL SCREENING AND DIAGNOSIS ALGORITHM: RPR Ser Ql: NONREACTIVE

## 2024-02-29 LAB — LIPID PANEL
Cholesterol: 146 mg/dL (ref <200)
HDL: 73 mg/dL (ref >=50)
LDL Cholesterol (Calc): 58 mg/dL
Non-HDL Cholesterol (Calc): 73 mg/dL (ref <130)
Total CHOL/HDL Ratio: 2 (calc) (ref <5.0)
Triglycerides: 73 mg/dL (ref <150)

## 2024-02-29 NOTE — Addendum Note (Signed)
 Encounter addended by: Wyatt Leeroy HERO, PA-C on: 02/29/2024 2:57 PM  Actions taken: Clinical Note Signed

## 2024-03-05 ENCOUNTER — Encounter: Payer: Self-pay | Admitting: Oncology

## 2024-03-10 ENCOUNTER — Encounter: Payer: Self-pay | Admitting: *Deleted

## 2024-03-11 ENCOUNTER — Other Ambulatory Visit: Payer: Self-pay | Admitting: *Deleted

## 2024-03-11 DIAGNOSIS — M1612 Unilateral primary osteoarthritis, left hip: Secondary | ICD-10-CM

## 2024-03-11 NOTE — Telephone Encounter (Signed)
 Last office visit: 01/23/2024 Last dispensed 02/06/2024 #60

## 2024-03-12 ENCOUNTER — Other Ambulatory Visit (HOSPITAL_BASED_OUTPATIENT_CLINIC_OR_DEPARTMENT_OTHER): Payer: Self-pay

## 2024-03-12 DIAGNOSIS — F17200 Nicotine dependence, unspecified, uncomplicated: Secondary | ICD-10-CM

## 2024-03-12 DIAGNOSIS — J432 Centrilobular emphysema: Secondary | ICD-10-CM

## 2024-03-12 MED ORDER — ALBUTEROL SULFATE HFA 108 (90 BASE) MCG/ACT IN AERS: 2.0000 | INHALATION_SPRAY | Freq: Four times a day (QID) | RESPIRATORY_TRACT | 5 refills | Status: AC | PRN

## 2024-03-13 ENCOUNTER — Encounter: Payer: Self-pay | Admitting: Oncology

## 2024-03-14 ENCOUNTER — Encounter: Payer: Self-pay | Admitting: Oncology

## 2024-03-17 MED ORDER — OXYCODONE-ACETAMINOPHEN 5-325 MG PO TABS: ORAL_TABLET | ORAL | 0 refills | Status: AC

## 2024-03-19 ENCOUNTER — Encounter: Payer: Self-pay | Admitting: Radiation Oncology

## 2024-03-19 NOTE — Progress Notes (Signed)
 "  Radiation Oncology         [phone] ________________________________  Name: Jessica [name] MRN: [id number]  Date: 03/20/2024  DOB: 1940/10/19  Follow-Up Visit Note  CC: Trudy Mliss Dragon, MD  Trudy Mliss Dragon, MD    ICD-10-CM   1. 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 She was last seen in office on 01/16/24 for a consultation visit. Since then, patient completed her radiation treatment which she tolerated quite well. Patient did however endorse experiencing mild fatigue as well as increased mucus production.  No other significant oncologic interval history since the patient was last seen.  Patient notes a poor appetite and chronic fatigue that has not improved or worsened since completing her radiation treatment. She has a productive cough, but is unable to cough anything up. She denies any fevers or chills. She denies any hemoptysis. She also notes a 3 day history of a sore throat since using her son's girlfriend's vape.                               Allergies:  is allergic to hctz [hydrochlorothiazide ].  Meds: Current Outpatient Medications  Medication Sig Dispense Refill   acyclovir  (ZOVIRAX ) 400 MG tablet Take 1 tablet (400 mg total) by mouth 3 (three) times daily as needed. 21 tablet 0   albuterol  (VENTOLIN  HFA) 108 (90 Base) MCG/ACT inhaler Inhale 2 puffs into the lungs every 6 (six) hours as needed for  wheezing or shortness of breath. 8.5 g 5   alendronate  (FOSAMAX ) 70 MG tablet Take 1 tablet (70 mg total) by mouth every Sunday. TAKE 1 TABLET BY MOUTH ONCE WEEKLY ON SUNDAY take with a full glass of water on an empty stomach Strength: 70 mg 12 tablet 3   ALPRAZolam  (XANAX ) 0.5 MG tablet Take 1 tablet (0.5 mg total) by mouth at bedtime as needed for sleep or anxiety. 30 tablet 3   amLODipine  (NORVASC ) 10 MG tablet Take 1 tablet (10 mg total) by mouth daily. For high BP 30 tablet 11   aspirin  EC 81 MG tablet Take 1 tablet (81 mg total) by mouth daily with breakfast. 90 tablet 3   atorvastatin  (LIPITOR) 10 MG tablet Take 1 tablet (10 mg total) by mouth at bedtime. 90 tablet 3   benazepril  (LOTENSIN ) 40 MG tablet Take 40 mg by mouth daily.     bictegravir-emtricitabine -tenofovir  AF (BIKTARVY ) 50-200-25 MG TABS tablet Take 1 tablet by mouth daily. 30 tablet 11   brimonidine  (ALPHAGAN ) 0.2 % ophthalmic solution 1 drop 3 (three) times daily.     clopidogrel  (PLAVIX ) 75 MG tablet Take 1 tablet (75 mg total) by mouth daily. 90 tablet 3   cyanocobalamin  (VITAMIN B12) 1000 MCG tablet Take 1 tablet (1,000 mcg total) by mouth daily. (Patient not taking: Reported on 02/27/2024) 30 tablet 2   diclofenac  Sodium (VOLTAREN ) 1 %  GEL Apply 2 g topically 4 (four) times daily. APPLY TWO GRAMS TO AFFECTED AREA(S) FOUR TIMES DAILY Strength: 1 % 350 g 2   DIPHENHYDRAMINE  HCL, TOPICAL, (BENADRYL  ITCH STOPPING) 2 % GEL Apply 1 Application topically every 4 (four) hours as needed (symptoms of bugs on scalp). (Patient not taking: Reported on 02/27/2024) 57 g 3   dorzolamide -timolol  (COSOPT ) 2-0.5 % ophthalmic solution Place 1 drop into the right eye 2 (two) times daily.     ferrous sulfate  325 (65 FE) MG tablet Take 1 tablet (325 mg total) by mouth daily with breakfast.     gabapentin  (NEURONTIN ) 300 MG capsule TAKE ONE CAPSULE BY MOUTH THREE TIMES DAILY (DOSE decrease) 270 capsule 3   latanoprost  (XALATAN ) 0.005 % ophthalmic  solution 1 drop at bedtime.     mirtazapine  (REMERON ) 7.5 MG tablet Take 1 tablet (7.5 mg total) by mouth at bedtime. 30 tablet 3   nicotine  (NICODERM CQ  - DOSED IN MG/24 HOURS) 14 mg/24hr patch RX #1 Weeks 1-6: 14 mg x 1 patch daily. Wear for 24 hours. If you have sleep disturbances, remove at bedtime. 42 patch 0   nicotine  (NICODERM CQ  - DOSED IN MG/24 HR) 7 mg/24hr patch RX #2 Weeks 7-8: 7 mg x 1 patch dailyWear for 24 hours. If you have sleep disturbances, remove at bedtime.. 14 patch 0   oxyCODONE -acetaminophen  (PERCOCET/ROXICET) 5-325 MG tablet Take 1 tablet only for severe pain; as often as every 8 hours.  Use sparingly due to risk of falls. 60 tablet 0   polyethylene glycol (MIRALAX ) 17 g packet Take 17 g by mouth daily. 90 packet 2   senna (SENOKOT) 8.6 MG TABS tablet Take 1 tablet (8.6 mg total) by mouth daily. (Patient not taking: Reported on 02/27/2024) 120 tablet 0   thiamine  (VITAMIN B1) 100 MG tablet Take 1 tablet (100 mg total) by mouth daily. 90 tablet 3   No current facility-administered medications for this encounter.    Physical Findings: The patient is in no acute distress. Patient is alert and oriented.  weight is 113 lb 6.4 oz (51.4 kg). Her oral temperature is 98 F (36.7 C). Her blood pressure is 92/61 and her pulse is 74. Her respiration is 16 and oxygen  saturation is 100%. .  No significant changes. Lungs are clear to auscultation bilaterally. Heart has regular rate and rhythm. No palpable cervical, supraclavicular, or axillary adenopathy. Abdomen soft, non-tender, normal bowel sounds. Oropharynx clear with no signs of thrush.    Lab Findings: Lab Results  Component Value Date   WBC 6.7 01/16/2024   HGB 12.7 01/16/2024   HCT 38.6 01/16/2024   MCV 98.0 01/16/2024   PLT 292 01/16/2024    Radiographic Findings: No results found.  Impression: Stage IA1 (cT1a, cN0, cM0) lung cancer, adenocarcinoma of the RUL; s/p SBRT completed on 02/11/2024   The patient has  healed well from the effects of her radiation treatment.   She continues to experience a cough where she feels like she can't get anything up. Recommend trial of Mucinex  to help with this.   We reviewed ways to increase caloric intake such as small meals throughout the day, drinking liquid calories, and ensure/boost drinks. We also reviewed good sleep hygiene. She currently takes xanax  at night to help with sleep.   We reviewed the results of her thyroid  U/S ordered by PCP. Biopsy of one of the visualized nodules was recommended. I will reach out to her PCP to help ensure this happens.  She is scheduled for restaging imaging and follow-up with medical oncology in March.  Radiation follow-up PRN. We appreciate the opportunity to take part in this patient's care. She was encouraged to call with any questions or concerns.    20 minutes of total time was spent for this patient encounter, including preparation, face-to-face counseling with the patient and coordination of care, physical exam, and documentation of the encounter. ____________________________________    Leeroy Due, PA-C   This document serves as a record of services personally performed by Leeroy Due, PA-C. It was created on her behalf by Reymundo Cartwright, a trained medical scribe. The creation of this record is based on the scribe's personal observations and the provider's statements to them. This document has been checked and approved by the attending provider.   "

## 2024-03-20 ENCOUNTER — Encounter: Payer: Self-pay | Admitting: Radiation Oncology

## 2024-03-20 ENCOUNTER — Ambulatory Visit
Admission: RE | Admit: 2024-03-20 | Discharge: 2024-03-20 | Disposition: A | Discharge status: Home / Self Care | Source: Ambulatory Visit | Attending: Radiation Oncology | Admitting: Radiation Oncology

## 2024-03-20 VITALS — BP 92/61 | HR 74 | Temp 98.0°F | Resp 16 | Wt 113.4 lb

## 2024-03-20 DIAGNOSIS — Z79624 Long term (current) use of inhibitors of nucleotide synthesis: Secondary | ICD-10-CM | POA: Insufficient documentation

## 2024-03-20 DIAGNOSIS — Z79899 Other long term (current) drug therapy: Secondary | ICD-10-CM | POA: Diagnosis not present

## 2024-03-20 DIAGNOSIS — Z7982 Long term (current) use of aspirin: Secondary | ICD-10-CM | POA: Insufficient documentation

## 2024-03-20 DIAGNOSIS — Z7983 Long term (current) use of bisphosphonates: Secondary | ICD-10-CM | POA: Insufficient documentation

## 2024-03-20 DIAGNOSIS — Z791 Long term (current) use of non-steroidal anti-inflammatories (NSAID): Secondary | ICD-10-CM | POA: Insufficient documentation

## 2024-03-20 DIAGNOSIS — Z7902 Long term (current) use of antithrombotics/antiplatelets: Secondary | ICD-10-CM | POA: Diagnosis not present

## 2024-03-20 DIAGNOSIS — C3411 Malignant neoplasm of upper lobe, right bronchus or lung: Secondary | ICD-10-CM | POA: Diagnosis present

## 2024-03-20 DIAGNOSIS — Z923 Personal history of irradiation: Secondary | ICD-10-CM | POA: Diagnosis not present

## 2024-03-20 NOTE — Progress Notes (Signed)
 Lauraine MALVA Donnell Elmer is here today for follow up post radiation to the lung.  Lung Side:  Right lung, patient completed treatment on 02/11/24.   Does the patient complain of any of the following: Pain: Yes, patient reports having pain to left leg and back.  Shortness of breath w/wo exertion:  Yes, mostly on exertion.  Cough: Yes, patient unable to cough up mucous.  Hemoptysis: No Pain with swallowing: Yes, due to having a sore throat.  Swallowing/choking concerns: No Appetite: Poor.  Energy Level: Low Post radiation skin Changes: No     Additional comments if applicable:   BP 92/61 (BP Location: Left Arm, Patient Position: Sitting, Cuff Size: Normal)   Pulse 74   Temp 98 F (36.7 C) (Oral)   Resp 16   Wt 113 lb 6.4 oz (51.4 kg)   SpO2 100%   BMI 18.30 kg/m

## 2024-03-28 ENCOUNTER — Other Ambulatory Visit: Payer: Self-pay

## 2024-03-28 MED ORDER — ACYCLOVIR 400 MG PO TABS: 400.0000 mg | ORAL_TABLET | Freq: Three times a day (TID) | ORAL | 0 refills | Status: AC | PRN

## 2024-04-17 ENCOUNTER — Other Ambulatory Visit: Payer: Self-pay

## 2024-04-17 DIAGNOSIS — F5102 Adjustment insomnia: Secondary | ICD-10-CM

## 2024-04-17 DIAGNOSIS — F411 Generalized anxiety disorder: Secondary | ICD-10-CM

## 2024-04-18 NOTE — Telephone Encounter (Signed)
 Should have refills (most recent prescription covered 4 months)

## 2024-05-08 ENCOUNTER — Ambulatory Visit (HOSPITAL_COMMUNITY)

## 2024-05-15 ENCOUNTER — Inpatient Hospital Stay: Attending: Physician Assistant

## 2024-05-15 ENCOUNTER — Inpatient Hospital Stay: Admitting: Physician Assistant

## 2024-05-21 ENCOUNTER — Ambulatory Visit: Admitting: Dermatology

## 2024-06-25 ENCOUNTER — Ambulatory Visit: Payer: Self-pay | Admitting: Infectious Disease

## 2024-08-12 ENCOUNTER — Ambulatory Visit: Admitting: Pulmonary Disease
